# Patient Record
Sex: Female | Born: 1965 | Race: White | Hispanic: No | Smoking: Never smoker
Health system: Southern US, Community
[De-identification: ages and names within clinical notes are randomized; demographics above are authoritative.]

## PROBLEM LIST (undated history)

## (undated) DIAGNOSIS — Z9221 Personal history of antineoplastic chemotherapy: Secondary | ICD-10-CM

## (undated) DIAGNOSIS — F32A Depression, unspecified: Secondary | ICD-10-CM

## (undated) DIAGNOSIS — Z9289 Personal history of other medical treatment: Secondary | ICD-10-CM

## (undated) DIAGNOSIS — F329 Major depressive disorder, single episode, unspecified: Secondary | ICD-10-CM

## (undated) DIAGNOSIS — E669 Obesity, unspecified: Secondary | ICD-10-CM

## (undated) DIAGNOSIS — I8290 Acute embolism and thrombosis of unspecified vein: Secondary | ICD-10-CM

## (undated) DIAGNOSIS — F419 Anxiety disorder, unspecified: Secondary | ICD-10-CM

## (undated) DIAGNOSIS — D649 Anemia, unspecified: Secondary | ICD-10-CM

## (undated) DIAGNOSIS — K219 Gastro-esophageal reflux disease without esophagitis: Secondary | ICD-10-CM

## (undated) DIAGNOSIS — N200 Calculus of kidney: Secondary | ICD-10-CM

## (undated) DIAGNOSIS — E119 Type 2 diabetes mellitus without complications: Secondary | ICD-10-CM

## (undated) DIAGNOSIS — E039 Hypothyroidism, unspecified: Secondary | ICD-10-CM

## (undated) DIAGNOSIS — K56609 Unspecified intestinal obstruction, unspecified as to partial versus complete obstruction: Secondary | ICD-10-CM

## (undated) HISTORY — DX: Obesity, unspecified: E66.9

## (undated) HISTORY — DX: Hypothyroidism, unspecified: E03.9

## (undated) HISTORY — DX: Unspecified intestinal obstruction, unspecified as to partial versus complete obstruction: K56.609

## (undated) HISTORY — DX: Anemia, unspecified: D64.9

## (undated) HISTORY — DX: Anxiety disorder, unspecified: F41.9

## (undated) HISTORY — DX: Depression, unspecified: F32.A

## (undated) HISTORY — DX: Personal history of antineoplastic chemotherapy: Z92.21

## (undated) HISTORY — DX: Acute embolism and thrombosis of unspecified vein: I82.90

## (undated) MED FILL — Fluorouracil IV Soln 5 GM/100ML (50 MG/ML): INTRAVENOUS | Qty: 87 | Status: AC

## (undated) MED FILL — Bevacizumab-awwb IV Soln 400 MG/16ML (For Infusion): INTRAVENOUS | Qty: 14 | Status: AC

## (undated) MED FILL — Dexamethasone Sodium Phosphate Inj 100 MG/10ML: INTRAMUSCULAR | Qty: 1 | Status: AC

## (undated) MED FILL — Oxaliplatin IV Soln 100 MG/20ML: INTRAVENOUS | Qty: 30 | Status: AC

## (undated) MED FILL — Fluorouracil IV Soln 5 GM/100ML (50 MG/ML): INTRAVENOUS | Qty: 81 | Status: AC

## (undated) MED FILL — Fosaprepitant Dimeglumine For IV Infusion 150 MG (Base Eq): INTRAVENOUS | Qty: 5 | Status: AC

## (undated) MED FILL — Fluorouracil IV Soln 2.5 GM/50ML (50 MG/ML): INTRAVENOUS | Qty: 14 | Status: AC

## (undated) MED FILL — Fluorouracil IV Soln 2.5 GM/50ML (50 MG/ML): INTRAVENOUS | Qty: 17 | Status: AC

## (undated) MED FILL — Fluorouracil IV Soln 2.5 GM/50ML (50 MG/ML): INTRAVENOUS | Qty: 16 | Status: AC

## (undated) MED FILL — Irinotecan HCl Inj 100 MG/5ML (20 MG/ML): INTRAVENOUS | Qty: 14 | Status: AC

## (undated) MED FILL — Leucovorin Calcium For Inj 350 MG: INTRAMUSCULAR | Qty: 41.8 | Status: AC

## (undated) MED FILL — Oxaliplatin IV Soln 50 MG/10ML: INTRAVENOUS | Qty: 23 | Status: AC

## (undated) MED FILL — Oxaliplatin IV Soln 50 MG/10ML: INTRAVENOUS | Qty: 30 | Status: AC

---
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
## 2010-06-01 DIAGNOSIS — Z87442 Personal history of urinary calculi: Secondary | ICD-10-CM

## 2010-06-01 HISTORY — DX: Personal history of urinary calculi: Z87.442

## 2016-06-01 DIAGNOSIS — C189 Malignant neoplasm of colon, unspecified: Secondary | ICD-10-CM

## 2016-06-01 HISTORY — DX: Malignant neoplasm of colon, unspecified: C18.9

## 2017-03-01 HISTORY — PX: COLON SURGERY: SHX602

## 2017-03-02 DIAGNOSIS — E860 Dehydration: Secondary | ICD-10-CM | POA: Diagnosis not present

## 2017-03-02 DIAGNOSIS — A419 Sepsis, unspecified organism: Secondary | ICD-10-CM

## 2017-03-02 DIAGNOSIS — F329 Major depressive disorder, single episode, unspecified: Secondary | ICD-10-CM | POA: Diagnosis not present

## 2017-03-02 DIAGNOSIS — E119 Type 2 diabetes mellitus without complications: Secondary | ICD-10-CM | POA: Diagnosis not present

## 2017-03-02 DIAGNOSIS — K639 Disease of intestine, unspecified: Secondary | ICD-10-CM

## 2017-03-02 DIAGNOSIS — K56609 Unspecified intestinal obstruction, unspecified as to partial versus complete obstruction: Secondary | ICD-10-CM

## 2017-03-03 DIAGNOSIS — A419 Sepsis, unspecified organism: Secondary | ICD-10-CM | POA: Diagnosis not present

## 2017-03-03 DIAGNOSIS — E860 Dehydration: Secondary | ICD-10-CM | POA: Diagnosis not present

## 2017-03-03 DIAGNOSIS — K639 Disease of intestine, unspecified: Secondary | ICD-10-CM | POA: Diagnosis not present

## 2017-03-03 DIAGNOSIS — K56609 Unspecified intestinal obstruction, unspecified as to partial versus complete obstruction: Secondary | ICD-10-CM | POA: Diagnosis not present

## 2017-03-04 DIAGNOSIS — K56609 Unspecified intestinal obstruction, unspecified as to partial versus complete obstruction: Secondary | ICD-10-CM | POA: Diagnosis not present

## 2017-03-04 DIAGNOSIS — A419 Sepsis, unspecified organism: Secondary | ICD-10-CM | POA: Diagnosis not present

## 2017-03-04 DIAGNOSIS — E119 Type 2 diabetes mellitus without complications: Secondary | ICD-10-CM | POA: Diagnosis not present

## 2017-03-04 DIAGNOSIS — E860 Dehydration: Secondary | ICD-10-CM | POA: Diagnosis not present

## 2017-03-04 DIAGNOSIS — F329 Major depressive disorder, single episode, unspecified: Secondary | ICD-10-CM | POA: Diagnosis not present

## 2017-03-04 DIAGNOSIS — K639 Disease of intestine, unspecified: Secondary | ICD-10-CM | POA: Diagnosis not present

## 2017-03-05 DIAGNOSIS — A419 Sepsis, unspecified organism: Secondary | ICD-10-CM | POA: Diagnosis not present

## 2017-03-05 DIAGNOSIS — K56609 Unspecified intestinal obstruction, unspecified as to partial versus complete obstruction: Secondary | ICD-10-CM | POA: Diagnosis not present

## 2017-03-05 DIAGNOSIS — E860 Dehydration: Secondary | ICD-10-CM | POA: Diagnosis not present

## 2017-03-05 DIAGNOSIS — K639 Disease of intestine, unspecified: Secondary | ICD-10-CM | POA: Diagnosis not present

## 2017-03-06 DIAGNOSIS — K639 Disease of intestine, unspecified: Secondary | ICD-10-CM | POA: Diagnosis not present

## 2017-03-06 DIAGNOSIS — K56609 Unspecified intestinal obstruction, unspecified as to partial versus complete obstruction: Secondary | ICD-10-CM | POA: Diagnosis not present

## 2017-03-06 DIAGNOSIS — E860 Dehydration: Secondary | ICD-10-CM | POA: Diagnosis not present

## 2017-03-06 DIAGNOSIS — A419 Sepsis, unspecified organism: Secondary | ICD-10-CM | POA: Diagnosis not present

## 2017-03-07 DIAGNOSIS — E860 Dehydration: Secondary | ICD-10-CM | POA: Diagnosis not present

## 2017-03-07 DIAGNOSIS — K639 Disease of intestine, unspecified: Secondary | ICD-10-CM | POA: Diagnosis not present

## 2017-03-07 DIAGNOSIS — A419 Sepsis, unspecified organism: Secondary | ICD-10-CM | POA: Diagnosis not present

## 2017-03-07 DIAGNOSIS — K56609 Unspecified intestinal obstruction, unspecified as to partial versus complete obstruction: Secondary | ICD-10-CM | POA: Diagnosis not present

## 2017-03-08 DIAGNOSIS — K56609 Unspecified intestinal obstruction, unspecified as to partial versus complete obstruction: Secondary | ICD-10-CM | POA: Diagnosis not present

## 2017-03-08 DIAGNOSIS — A419 Sepsis, unspecified organism: Secondary | ICD-10-CM | POA: Diagnosis not present

## 2017-03-08 DIAGNOSIS — E860 Dehydration: Secondary | ICD-10-CM | POA: Diagnosis not present

## 2017-03-08 DIAGNOSIS — K639 Disease of intestine, unspecified: Secondary | ICD-10-CM | POA: Diagnosis not present

## 2017-03-09 DIAGNOSIS — A419 Sepsis, unspecified organism: Secondary | ICD-10-CM | POA: Diagnosis not present

## 2017-03-09 DIAGNOSIS — K639 Disease of intestine, unspecified: Secondary | ICD-10-CM | POA: Diagnosis not present

## 2017-03-09 DIAGNOSIS — K56609 Unspecified intestinal obstruction, unspecified as to partial versus complete obstruction: Secondary | ICD-10-CM | POA: Diagnosis not present

## 2017-03-09 DIAGNOSIS — E860 Dehydration: Secondary | ICD-10-CM | POA: Diagnosis not present

## 2017-03-17 DIAGNOSIS — C183 Malignant neoplasm of hepatic flexure: Secondary | ICD-10-CM | POA: Insufficient documentation

## 2017-03-19 DIAGNOSIS — C182 Malignant neoplasm of ascending colon: Secondary | ICD-10-CM | POA: Diagnosis not present

## 2017-04-26 DIAGNOSIS — I951 Orthostatic hypotension: Secondary | ICD-10-CM

## 2017-04-26 DIAGNOSIS — C182 Malignant neoplasm of ascending colon: Secondary | ICD-10-CM | POA: Diagnosis not present

## 2017-05-11 DIAGNOSIS — C182 Malignant neoplasm of ascending colon: Secondary | ICD-10-CM | POA: Diagnosis not present

## 2017-05-11 DIAGNOSIS — Z86718 Personal history of other venous thrombosis and embolism: Secondary | ICD-10-CM | POA: Diagnosis not present

## 2017-05-11 DIAGNOSIS — Z9049 Acquired absence of other specified parts of digestive tract: Secondary | ICD-10-CM | POA: Diagnosis not present

## 2017-05-24 DIAGNOSIS — I951 Orthostatic hypotension: Secondary | ICD-10-CM | POA: Diagnosis not present

## 2017-05-24 DIAGNOSIS — Z86718 Personal history of other venous thrombosis and embolism: Secondary | ICD-10-CM | POA: Diagnosis not present

## 2017-05-24 DIAGNOSIS — Z7901 Long term (current) use of anticoagulants: Secondary | ICD-10-CM | POA: Diagnosis not present

## 2017-05-24 DIAGNOSIS — Z9049 Acquired absence of other specified parts of digestive tract: Secondary | ICD-10-CM | POA: Diagnosis not present

## 2017-05-24 DIAGNOSIS — C182 Malignant neoplasm of ascending colon: Secondary | ICD-10-CM | POA: Diagnosis not present

## 2017-06-01 DIAGNOSIS — I82409 Acute embolism and thrombosis of unspecified deep veins of unspecified lower extremity: Secondary | ICD-10-CM

## 2017-06-01 HISTORY — PX: COLONOSCOPY: SHX174

## 2017-06-01 HISTORY — DX: Acute embolism and thrombosis of unspecified deep veins of unspecified lower extremity: I82.409

## 2017-06-08 DIAGNOSIS — D701 Agranulocytosis secondary to cancer chemotherapy: Secondary | ICD-10-CM | POA: Diagnosis not present

## 2017-06-08 DIAGNOSIS — D6959 Other secondary thrombocytopenia: Secondary | ICD-10-CM | POA: Diagnosis not present

## 2017-06-08 DIAGNOSIS — C182 Malignant neoplasm of ascending colon: Secondary | ICD-10-CM | POA: Diagnosis not present

## 2017-06-08 DIAGNOSIS — Z7901 Long term (current) use of anticoagulants: Secondary | ICD-10-CM | POA: Diagnosis not present

## 2017-06-22 DIAGNOSIS — Z7901 Long term (current) use of anticoagulants: Secondary | ICD-10-CM | POA: Diagnosis not present

## 2017-06-22 DIAGNOSIS — C182 Malignant neoplasm of ascending colon: Secondary | ICD-10-CM | POA: Diagnosis not present

## 2017-06-22 DIAGNOSIS — D701 Agranulocytosis secondary to cancer chemotherapy: Secondary | ICD-10-CM | POA: Diagnosis not present

## 2017-06-22 DIAGNOSIS — D6959 Other secondary thrombocytopenia: Secondary | ICD-10-CM | POA: Diagnosis not present

## 2017-07-05 DIAGNOSIS — C182 Malignant neoplasm of ascending colon: Secondary | ICD-10-CM | POA: Diagnosis not present

## 2017-07-05 DIAGNOSIS — D701 Agranulocytosis secondary to cancer chemotherapy: Secondary | ICD-10-CM | POA: Diagnosis not present

## 2017-07-05 DIAGNOSIS — D6959 Other secondary thrombocytopenia: Secondary | ICD-10-CM | POA: Diagnosis not present

## 2017-07-26 DIAGNOSIS — D6959 Other secondary thrombocytopenia: Secondary | ICD-10-CM | POA: Diagnosis not present

## 2017-07-26 DIAGNOSIS — C182 Malignant neoplasm of ascending colon: Secondary | ICD-10-CM | POA: Diagnosis not present

## 2017-07-26 DIAGNOSIS — R040 Epistaxis: Secondary | ICD-10-CM | POA: Diagnosis not present

## 2017-07-26 DIAGNOSIS — R197 Diarrhea, unspecified: Secondary | ICD-10-CM | POA: Diagnosis not present

## 2017-07-26 DIAGNOSIS — Z9049 Acquired absence of other specified parts of digestive tract: Secondary | ICD-10-CM | POA: Diagnosis not present

## 2017-08-10 DIAGNOSIS — C182 Malignant neoplasm of ascending colon: Secondary | ICD-10-CM | POA: Diagnosis not present

## 2017-08-10 DIAGNOSIS — Z9049 Acquired absence of other specified parts of digestive tract: Secondary | ICD-10-CM | POA: Diagnosis not present

## 2017-08-24 DIAGNOSIS — C182 Malignant neoplasm of ascending colon: Secondary | ICD-10-CM | POA: Diagnosis not present

## 2017-08-24 DIAGNOSIS — R42 Dizziness and giddiness: Secondary | ICD-10-CM | POA: Diagnosis not present

## 2017-08-24 DIAGNOSIS — D6959 Other secondary thrombocytopenia: Secondary | ICD-10-CM | POA: Diagnosis not present

## 2017-08-24 DIAGNOSIS — Z9049 Acquired absence of other specified parts of digestive tract: Secondary | ICD-10-CM | POA: Diagnosis not present

## 2017-10-07 DIAGNOSIS — G629 Polyneuropathy, unspecified: Secondary | ICD-10-CM | POA: Diagnosis not present

## 2017-10-07 DIAGNOSIS — Z85038 Personal history of other malignant neoplasm of large intestine: Secondary | ICD-10-CM | POA: Diagnosis not present

## 2017-10-07 DIAGNOSIS — Z9221 Personal history of antineoplastic chemotherapy: Secondary | ICD-10-CM | POA: Diagnosis not present

## 2017-10-07 DIAGNOSIS — Z9049 Acquired absence of other specified parts of digestive tract: Secondary | ICD-10-CM | POA: Diagnosis not present

## 2017-10-07 DIAGNOSIS — Z809 Family history of malignant neoplasm, unspecified: Secondary | ICD-10-CM | POA: Diagnosis not present

## 2018-02-07 DIAGNOSIS — Z9049 Acquired absence of other specified parts of digestive tract: Secondary | ICD-10-CM | POA: Diagnosis not present

## 2018-02-07 DIAGNOSIS — Z9221 Personal history of antineoplastic chemotherapy: Secondary | ICD-10-CM

## 2018-02-07 DIAGNOSIS — Z85038 Personal history of other malignant neoplasm of large intestine: Secondary | ICD-10-CM | POA: Diagnosis not present

## 2018-03-30 DIAGNOSIS — F429 Obsessive-compulsive disorder, unspecified: Secondary | ICD-10-CM | POA: Insufficient documentation

## 2018-04-14 ENCOUNTER — Encounter: Payer: Self-pay | Admitting: Psychiatry

## 2018-04-14 ENCOUNTER — Ambulatory Visit (INDEPENDENT_AMBULATORY_CARE_PROVIDER_SITE_OTHER): Payer: PRIVATE HEALTH INSURANCE | Admitting: Psychiatry

## 2018-04-14 DIAGNOSIS — F333 Major depressive disorder, recurrent, severe with psychotic symptoms: Secondary | ICD-10-CM | POA: Diagnosis not present

## 2018-04-14 DIAGNOSIS — F422 Mixed obsessional thoughts and acts: Secondary | ICD-10-CM | POA: Diagnosis not present

## 2018-04-14 DIAGNOSIS — F331 Major depressive disorder, recurrent, moderate: Secondary | ICD-10-CM | POA: Diagnosis not present

## 2018-04-14 MED ORDER — LAMOTRIGINE 150 MG PO TABS: 150.0000 mg | ORAL_TABLET | Freq: Every day | ORAL | 5 refills | Status: DC

## 2018-04-14 MED ORDER — SEROQUEL 400 MG PO TABS: 800.0000 mg | ORAL_TABLET | Freq: Every day | ORAL | 11 refills | Status: DC

## 2018-04-14 MED ORDER — FLUVOXAMINE MALEATE 100 MG PO TABS: 300.0000 mg | ORAL_TABLET | Freq: Every day | ORAL | 5 refills | Status: DC

## 2018-04-14 NOTE — Progress Notes (Signed)
Shannon [name] [id number] [birth year] 52 y.o.  Subjective:   Patient ID:  Shannon [name] is a 52 y.o. (DOB 17-Nov-1965) female.  Chief Complaint:  Chief Complaint  Patient presents with  . Follow-up    depression and anxiety    HPI Shannon [name] presents to the office today for follow-up of major depression with psychotic features and OCD.  Feel good.  Thinking is good.  Better at work.  Better productivity and quality DT feeling better mentally and physically.  Friendlier. Patient reports stable mood and denies depressed or irritable moods.  Patient denies any recent difficulty with anxiety.  Patient denies difficulty with sleep initiation or maintenance. Denies appetite disturbance.  Patient reports that energy and motivation have been good.  Patient denies any difficulty with concentration.  Patient denies any suicidal ideation. Anxious, obsessive thoughts minimal.  Review of Systems:  Review of Systems  Constitutional: Positive for unexpected weight change.  Neurological: Negative for tremors and weakness.  Psychiatric/Behavioral: Negative for agitation, behavioral problems, confusion, decreased concentration, dysphoric mood, hallucinations, self-injury, sleep disturbance and suicidal ideas. The patient is not nervous/anxious and is not hyperactive.     Medications: I have reviewed the patient's current medications.  Current Outpatient Medications  Medication Sig Dispense Refill  . atorvastatin (LIPITOR) 10 MG tablet Take 10 mg by mouth daily.  0  . Dulaglutide (TRULICITY) 1.5 WU/9.8JX SOPN INJECT 1.5MG  ONCE WEEKLY AS DIRECTED    . fluvoxaMINE (LUVOX) 100 MG tablet Take 3 tablets (300 mg total) by mouth at bedtime. 90 tablet 5  . insulin aspart (NOVOLOG FLEXPEN) 100 UNIT/ML FlexPen USE AS DIRECTED PER SLIDING SCALE    . lamoTRIgine (LAMICTAL) 150 MG tablet Take 1 tablet (150 mg total) by mouth daily. 30 tablet 5  . levothyroxine (SYNTHROID,  LEVOTHROID) 50 MCG tablet Take 50 mcg by mouth daily.  2  . metFORMIN (GLUCOPHAGE) 500 MG tablet Take by mouth.    . SEROQUEL 400 MG tablet Take 2 tablets (800 mg total) by mouth at bedtime. Brand medically necessary 60 tablet 11   No current facility-administered medications for this visit.     Medication Side Effects: None  Allergies:  Allergies  Allergen Reactions  . Clindamycin/Lincomycin Rash  . Penicillins Rash    History reviewed. No pertinent past medical history.  History reviewed. No pertinent family history.  Social History   Socioeconomic History  . Marital status: Widowed    Spouse name: Not on file  . Number of children: Not on file  . Years of education: Not on file  . Highest education level: Not on file  Occupational History  . Not on file  Social Needs  . Financial resource strain: Not on file  . Food insecurity:    Worry: Not on file    Inability: Not on file  . Transportation needs:    Medical: Not on file    Non-medical: Not on file  Tobacco Use  . Smoking status: Not on file  Substance and Sexual Activity  . Alcohol use: Not on file  . Drug use: Not on file  . Sexual activity: Not on file  Lifestyle  . Physical activity:    Days per week: Not on file    Minutes per session: Not on file  . Stress: Not on file  Relationships  . Social connections:    Talks on phone: Not on file    Gets together: Not on file    Attends religious service:  Not on file    Active member of club or organization: Not on file    Attends meetings of clubs or organizations: Not on file    Relationship status: Not on file  . Intimate partner violence:    Fear of current or ex partner: Not on file    Emotionally abused: Not on file    Physically abused: Not on file    Forced sexual activity: Not on file  Other Topics Concern  . Not on file  Social History Narrative  . Not on file    Past Medical History, Surgical history, Social history, and Family history  were reviewed and updated as appropriate.   Please see review of systems for further details on the patient's review from today.   Objective:   Physical Exam:  There were no vitals taken for this visit.  Physical Exam  Constitutional: She is oriented to person, place, and time. She appears well-developed. No distress.  Musculoskeletal: She exhibits no deformity.  Neurological: She is alert and oriented to person, place, and time. She displays no tremor. Coordination and gait normal.  Psychiatric: She has a normal mood and affect. Her speech is normal and behavior is normal. Judgment and thought content normal. Her mood appears not anxious. Her affect is not angry, not blunt, not labile and not inappropriate. Cognition and memory are normal. She does not exhibit a depressed mood. She expresses no homicidal and no suicidal ideation. She expresses no suicidal plans and no homicidal plans.  Insight intact. No auditory or visual hallucinations. No delusions.     Lab Review:  No results found for: NA, K, CL, CO2, GLUCOSE, BUN, CREATININE, CALCIUM, PROT, ALBUMIN, AST, ALT, ALKPHOS, BILITOT, GFRNONAA, GFRAA  No results found for: WBC, RBC, HGB, HCT, PLT, MCV, MCH, MCHC, RDW, LYMPHSABS, MONOABS, EOSABS, BASOSABS  No results found for: POCLITH, LITHIUM   No results found for: PHENYTOIN, PHENOBARB, VALPROATE, CBMZ   .res Assessment: Plan:    Severe recurrent major depression with psychotic features ([place]) - Plan: SEROQUEL 400 MG tablet  Mixed obsessional thoughts and acts  Major depressive disorder, recurrent episode, moderate ([place])   Discussed potential metabolic side effects associated with atypical antipsychotics, as well as potential risk for movement side effects. Advised pt to contact office if movement side effects occur.   Counseled patient regarding potential benefits, risks, and side effects of Lamictal to include potential risk of Stevens-Johnson syndrome. Advised patient to stop  taking Lamictal and contact office immediately if rash develops and to seek urgent medical attention if rash is severe and/or spreading quickly.  She's benefitting from meds and is satisfied.  Continue current meds.  Encourage diet and wt loss.  This appt was 30 mins.  FU a few months.  Lynder Parents, MD, DFAPA   Please see After Visit Summary for patient specific instructions.  No future appointments.  No orders of the defined types were placed in this encounter.     -------------------------------

## 2018-06-01 HISTORY — PX: BREAST BIOPSY: SHX20

## 2018-06-15 DIAGNOSIS — Z85038 Personal history of other malignant neoplasm of large intestine: Secondary | ICD-10-CM | POA: Diagnosis not present

## 2018-06-15 DIAGNOSIS — Z9221 Personal history of antineoplastic chemotherapy: Secondary | ICD-10-CM | POA: Diagnosis not present

## 2018-06-15 DIAGNOSIS — R1013 Epigastric pain: Secondary | ICD-10-CM

## 2018-06-15 DIAGNOSIS — Z9049 Acquired absence of other specified parts of digestive tract: Secondary | ICD-10-CM | POA: Diagnosis not present

## 2018-06-22 DIAGNOSIS — Z85038 Personal history of other malignant neoplasm of large intestine: Secondary | ICD-10-CM | POA: Diagnosis not present

## 2018-06-22 DIAGNOSIS — K769 Liver disease, unspecified: Secondary | ICD-10-CM

## 2018-10-11 ENCOUNTER — Other Ambulatory Visit: Payer: Self-pay | Admitting: Psychiatry

## 2018-10-11 DIAGNOSIS — F333 Major depressive disorder, recurrent, severe with psychotic symptoms: Secondary | ICD-10-CM

## 2018-10-12 ENCOUNTER — Ambulatory Visit (INDEPENDENT_AMBULATORY_CARE_PROVIDER_SITE_OTHER): Payer: PRIVATE HEALTH INSURANCE | Admitting: Psychiatry

## 2018-10-12 ENCOUNTER — Encounter: Payer: Self-pay | Admitting: Psychiatry

## 2018-10-12 DIAGNOSIS — G4721 Circadian rhythm sleep disorder, delayed sleep phase type: Secondary | ICD-10-CM

## 2018-10-12 DIAGNOSIS — F422 Mixed obsessional thoughts and acts: Secondary | ICD-10-CM | POA: Diagnosis not present

## 2018-10-12 DIAGNOSIS — F333 Major depressive disorder, recurrent, severe with psychotic symptoms: Secondary | ICD-10-CM

## 2018-10-12 MED ORDER — SEROQUEL 400 MG PO TABS: 800.0000 mg | ORAL_TABLET | Freq: Every day | ORAL | 3 refills | Status: DC

## 2018-10-12 MED ORDER — FLUVOXAMINE MALEATE 100 MG PO TABS: 300.0000 mg | ORAL_TABLET | Freq: Every day | ORAL | 1 refills | Status: DC

## 2018-10-12 MED ORDER — LAMOTRIGINE 150 MG PO TABS: 150.0000 mg | ORAL_TABLET | Freq: Every day | ORAL | 1 refills | Status: DC

## 2018-10-12 NOTE — Progress Notes (Signed)
Shannon [name] [id number] 1966/01/25 53 y.o.   Virtual Visit via Telephone Note  I connected with pt by telephone and verified that I am speaking with the correct person using two identifiers.   I discussed the limitations, risks, security and privacy concerns of performing an evaluation and management service by telephone and the availability of in person appointments. I also discussed with the patient that there may be a patient responsible charge related to this service. The patient expressed understanding and agreed to proceed.  I discussed the assessment and treatment plan with the patient. The patient was provided an opportunity to ask questions and all were answered. The patient agreed with the plan and demonstrated an understanding of the instructions.   The patient was advised to call back or seek an in-person evaluation if the symptoms worsen or if the condition fails to improve as anticipated.  I provided 15 minutes of non-face-to-face time during this encounter. The call started at 455 and ended at 5:10. The patient was located at home and the provider was located office.   Subjective:   Patient ID:  Shannon [name] is a 53 y.o. (DOB 04/08/1966) female.  Chief Complaint:  Chief Complaint  Patient presents with  . Follow-up    med mangement    HPI Shannon [name] presents  today for follow-up of major depression with psychotic features and OCD.  Last seen in November.  No meds were changed at that time.  Covid affected routine and her sleep.  Ativan has not helped sleep. Rarely takes.  No caffeine usually after noon.  Some fears of Covid and dying of it.  Stopped watching the news over it.  Feel good.  Thinking is good.  Better at work.  Better productivity and quality DT feeling better mentally and physically.  Friendlier. Only situational mood effects. Patient reports stable mood and denies depressed or irritable moods. Some anxiety over it.   Patient denies difficulty with sleep maintenance.  Being some trouble falling asleep but is got off of her usual sleep-wake cycle due to the Covid virus.  Denies appetite disturbance.  Patient reports that energy and motivation have been good.  Patient denies any difficulty with concentration.  Patient denies any suicidal ideation. Anxious, obsessive thoughts minimal.  Past Psychiatric Medication Trials: HX RELAPSE PSYCHOSIS WITH GENERIC QUETIAPINE,  Citalopram, fluoxetine 80, sertraline to 50, in Vega 3, Abilify 15, lamotrigine 200, Seroquel 800, Risperdal 2 Has been under the care of the psychiatrist since March 2000  Review of Systems:  Review of Systems  Constitutional: Positive for unexpected weight change.  Neurological: Negative for tremors and weakness.  Psychiatric/Behavioral: Negative for agitation, behavioral problems, confusion, decreased concentration, dysphoric mood, hallucinations, self-injury, sleep disturbance and suicidal ideas. The patient is not nervous/anxious and is not hyperactive.     Medications: I have reviewed the patient's current medications.  Current Outpatient Medications  Medication Sig Dispense Refill  . atorvastatin (LIPITOR) 10 MG tablet Take 10 mg by mouth daily.  0  . Dulaglutide (TRULICITY) 1.5 YD/7.4JO SOPN INJECT 1.5MG  ONCE WEEKLY AS DIRECTED    . fluvoxaMINE (LUVOX) 100 MG tablet Take 3 tablets (300 mg total) by mouth at bedtime. 270 tablet 1  . insulin aspart (NOVOLOG FLEXPEN) 100 UNIT/ML FlexPen USE AS DIRECTED PER SLIDING SCALE    . lamoTRIgine (LAMICTAL) 150 MG tablet Take 1 tablet (150 mg total) by mouth daily. 90 tablet 1  . levothyroxine (SYNTHROID, LEVOTHROID) 50 MCG tablet Take 50  mcg by mouth daily.  2  . metFORMIN (GLUCOPHAGE) 500 MG tablet Take by mouth.    . SEROQUEL 400 MG tablet Take 2 tablets (800 mg total) by mouth at bedtime. Brand medically necessary 180 tablet 3   No current facility-administered medications for this visit.      Medication Side Effects: None  Allergies:  Allergies  Allergen Reactions  . Clindamycin/Lincomycin Rash  . Penicillins Rash    History reviewed. No pertinent past medical history.  History reviewed. No pertinent family history.  Social History   Socioeconomic History  . Marital status: Widowed    Spouse name: Not on file  . Number of children: Not on file  . Years of education: Not on file  . Highest education level: Not on file  Occupational History  . Not on file  Social Needs  . Financial resource strain: Not on file  . Food insecurity:    Worry: Not on file    Inability: Not on file  . Transportation needs:    Medical: Not on file    Non-medical: Not on file  Tobacco Use  . Smoking status: Never Smoker  . Smokeless tobacco: Never Used  Substance and Sexual Activity  . Alcohol use: Not on file  . Drug use: Not on file  . Sexual activity: Not on file  Lifestyle  . Physical activity:    Days per week: Not on file    Minutes per session: Not on file  . Stress: Not on file  Relationships  . Social connections:    Talks on phone: Not on file    Gets together: Not on file    Attends religious service: Not on file    Active member of club or organization: Not on file    Attends meetings of clubs or organizations: Not on file    Relationship status: Not on file  . Intimate partner violence:    Fear of current or ex partner: Not on file    Emotionally abused: Not on file    Physically abused: Not on file    Forced sexual activity: Not on file  Other Topics Concern  . Not on file  Social History Narrative  . Not on file    Past Medical History, Surgical history, Social history, and Family history were reviewed and updated as appropriate.   Please see review of systems for further details on the patient's review from today.   Objective:   Physical Exam:  There were no vitals taken for this visit.  Physical Exam Constitutional:      General: She  is not in acute distress.    Appearance: She is well-developed.  Musculoskeletal:        General: No deformity.  Neurological:     Mental Status: She is alert and oriented to person, place, and time.     Motor: No tremor.     Coordination: Coordination normal.     Gait: Gait normal.  Psychiatric:        Mood and Affect: Mood is not anxious or depressed. Affect is not labile, blunt, angry or inappropriate.        Speech: Speech normal.        Behavior: Behavior normal.        Thought Content: Thought content normal. Thought content does not include homicidal or suicidal ideation. Thought content does not include homicidal or suicidal plan.        Judgment: Judgment normal.  Comments: Insight intact. No auditory or visual hallucinations. No delusions.      Lab Review:  No results found for: NA, K, CL, CO2, GLUCOSE, BUN, CREATININE, CALCIUM, PROT, ALBUMIN, AST, ALT, ALKPHOS, BILITOT, GFRNONAA, GFRAA  No results found for: WBC, RBC, HGB, HCT, PLT, MCV, MCH, MCHC, RDW, LYMPHSABS, MONOABS, EOSABS, BASOSABS  No results found for: POCLITH, LITHIUM   No results found for: PHENYTOIN, PHENOBARB, VALPROATE, CBMZ   .res Assessment: Plan:    Severe recurrent major depression with psychotic features ([place]) - Plan: SEROQUEL 400 MG tablet  Mixed obsessional thoughts and acts  Delayed sleep phase syndrome   Discussed potential metabolic side effects associated with atypical antipsychotics, as well as potential risk for movement side effects. Advised pt to contact office if movement side effects occur.   Counseled patient regarding potential benefits, risks, and side effects of Lamictal to include potential risk of Stevens-Johnson syndrome. Advised patient to stop taking Lamictal and contact office immediately if rash develops and to seek urgent medical attention if rash is severe and/or spreading quickly.  She's benefitting from meds and is satisfied.  Continue current meds. HX RELAPSE  PSYCHOSIS WITH GENERIC QUETIAPINE,  Encourage diet and wt loss.  Sleep hygiene.  No caffeine after 3 PM at the latest.  Option of melatonin  FU a few months.  Lynder Parents, MD, DFAPA   Please see After Visit Summary for patient specific instructions.  No future appointments.  No orders of the defined types were placed in this encounter.     -------------------------------

## 2018-10-21 DIAGNOSIS — Z85038 Personal history of other malignant neoplasm of large intestine: Secondary | ICD-10-CM | POA: Diagnosis not present

## 2019-02-06 ENCOUNTER — Other Ambulatory Visit: Payer: Self-pay | Admitting: Psychiatry

## 2019-02-06 DIAGNOSIS — F333 Major depressive disorder, recurrent, severe with psychotic symptoms: Secondary | ICD-10-CM

## 2019-02-24 DIAGNOSIS — Z803 Family history of malignant neoplasm of breast: Secondary | ICD-10-CM | POA: Diagnosis not present

## 2019-02-24 DIAGNOSIS — C182 Malignant neoplasm of ascending colon: Secondary | ICD-10-CM

## 2019-02-25 ENCOUNTER — Other Ambulatory Visit: Payer: Self-pay | Admitting: Psychiatry

## 2019-02-27 ENCOUNTER — Other Ambulatory Visit: Payer: Self-pay | Admitting: Oncology

## 2019-02-27 DIAGNOSIS — R9389 Abnormal findings on diagnostic imaging of other specified body structures: Secondary | ICD-10-CM

## 2019-03-09 ENCOUNTER — Other Ambulatory Visit: Payer: Self-pay

## 2019-03-09 ENCOUNTER — Ambulatory Visit
Admission: RE | Admit: 2019-03-09 | Discharge: 2019-03-09 | Disposition: A | Discharge status: Home / Self Care | Payer: PRIVATE HEALTH INSURANCE | Source: Ambulatory Visit | Attending: *Deleted | Admitting: *Deleted

## 2019-03-09 ENCOUNTER — Other Ambulatory Visit (HOSPITAL_COMMUNITY): Payer: Self-pay | Admitting: Diagnostic Radiology

## 2019-03-09 DIAGNOSIS — R9389 Abnormal findings on diagnostic imaging of other specified body structures: Secondary | ICD-10-CM

## 2019-03-09 MED ORDER — GADOBUTROL 1 MMOL/ML IV SOLN
9.0000 mL | Freq: Once | INTRAVENOUS | Status: AC | PRN
  Administered 2019-03-09: 9 mL via INTRAVENOUS

## 2019-04-17 ENCOUNTER — Ambulatory Visit (INDEPENDENT_AMBULATORY_CARE_PROVIDER_SITE_OTHER): Payer: PRIVATE HEALTH INSURANCE | Admitting: Psychiatry

## 2019-04-17 ENCOUNTER — Other Ambulatory Visit: Payer: Self-pay

## 2019-04-17 ENCOUNTER — Encounter: Payer: Self-pay | Admitting: Psychiatry

## 2019-04-17 DIAGNOSIS — F333 Major depressive disorder, recurrent, severe with psychotic symptoms: Secondary | ICD-10-CM

## 2019-04-17 DIAGNOSIS — F422 Mixed obsessional thoughts and acts: Secondary | ICD-10-CM | POA: Diagnosis not present

## 2019-04-17 NOTE — Progress Notes (Signed)
Shannon [name] [id number] [birth year] 53 y.o.    Subjective:   Patient ID:  Shannon [name] is a 53 y.o. (DOB November 27, 1965) female.  Chief Complaint:  Chief Complaint  Patient presents with  . Follow-up    Medication Management  . Depression    Medication Management    HPI Shannon [name] presents  today for follow-up of major depression with psychotic features and OCD.  Last seen in May 2020 .  No meds were changed at that time.  Good overall.  Struggling with Covid restrictions.  No mood swings.  Probably sleep too much to avoid.  Occ stress from work interferes with sleep.  Not as productive from home.  Hopes to get back to the office.  Can usually nap if desired for years and not seasonal.  Only anxiety is Covid related. More poor self care.   Hysterectomy December.  Feel good.  Thinking is good.  Better at work.  Better productivity and quality DT feeling better mentally and physically.  Friendlier. Only situational mood effects. Patient reports stable mood and denies depressed or irritable moods. Some anxiety over it.  Patient denies difficulty with sleep maintenance.  Being some trouble falling asleep but is got off of her usual sleep-wake cycle due to the Covid virus.  Denies appetite disturbance.  Patient reports that energy and motivation have been good.  Patient denies any difficulty with concentration.  Patient denies any suicidal ideation. Anxious, obsessive thoughts minimal.  Past Psychiatric Medication Trials: HX RELAPSE PSYCHOSIS WITH GENERIC QUETIAPINE,  Citalopram, fluoxetine 80, sertraline to 50, in [place] 3, Abilify 15, lamotrigine 200, Seroquel 800, Risperdal 2 Has been under the care of the psychiatrist since March 2000  Review of Systems:  Review of Systems  Constitutional: Positive for unexpected weight change.  Neurological: Negative for tremors and weakness.  Psychiatric/Behavioral: Negative for agitation, behavioral problems,  confusion, decreased concentration, dysphoric mood, hallucinations, self-injury, sleep disturbance and suicidal ideas. The patient is not nervous/anxious and is not hyperactive.     Medications: I have reviewed the patient's current medications.  Current Outpatient Medications  Medication Sig Dispense Refill  . atorvastatin (LIPITOR) 10 MG tablet Take 10 mg by mouth daily.  0  . Dulaglutide (TRULICITY) 1.5 0000000 SOPN INJECT 1.5MG  ONCE WEEKLY AS DIRECTED    . fluvoxaMINE (LUVOX) 100 MG tablet TAKE 3 TABLETS (300 MG TOTAL) BY MOUTH AT BEDTIME. 270 tablet 2  . insulin aspart (NOVOLOG FLEXPEN) 100 UNIT/ML FlexPen USE AS DIRECTED PER SLIDING SCALE    . lamoTRIgine (LAMICTAL) 150 MG tablet TAKE 1 TABLET BY MOUTH EVERY DAY 30 tablet 5  . LANTUS SOLOSTAR 100 UNIT/ML Solostar Pen SMARTSIG:65 Unit(s) SUB-Q Every Night    . levothyroxine (SYNTHROID) 75 MCG tablet Take 75 mcg by mouth daily.    . metFORMIN (GLUCOPHAGE) 500 MG tablet Take by mouth.    . SEROQUEL 400 MG tablet TAKE 2 TABLETS (800 MG TOTAL) BY MOUTH AT BEDTIME. BRAND MEDICALLY NECESSARY 180 tablet 4   No current facility-administered medications for this visit.     Medication Side Effects: None  Allergies:  Allergies  Allergen Reactions  . Clindamycin/Lincomycin Rash  . Penicillins Rash    History reviewed. No pertinent past medical history.  History reviewed. No pertinent family history.  Social History   Socioeconomic History  . Marital status: Widowed    Spouse name: Not on file  . Number of children: Not on file  . Years of education: Not on  file  . Highest education level: Not on file  Occupational History  . Not on file  Social Needs  . Financial resource strain: Not on file  . Food insecurity    Worry: Not on file    Inability: Not on file  . Transportation needs    Medical: Not on file    Non-medical: Not on file  Tobacco Use  . Smoking status: Never Smoker  . Smokeless tobacco: Never Used  Substance  and Sexual Activity  . Alcohol use: Not on file  . Drug use: Not on file  . Sexual activity: Not on file  Lifestyle  . Physical activity    Days per week: Not on file    Minutes per session: Not on file  . Stress: Not on file  Relationships  . Social Herbalist on phone: Not on file    Gets together: Not on file    Attends religious service: Not on file    Active member of club or organization: Not on file    Attends meetings of clubs or organizations: Not on file    Relationship status: Not on file  . Intimate partner violence    Fear of current or ex partner: Not on file    Emotionally abused: Not on file    Physically abused: Not on file    Forced sexual activity: Not on file  Other Topics Concern  . Not on file  Social History Narrative  . Not on file    Past Medical History, Surgical history, Social history, and Family history were reviewed and updated as appropriate.   Please see review of systems for further details on the patient's review from today.   Objective:   Physical Exam:  There were no vitals taken for this visit.  Physical Exam Constitutional:      General: She is not in acute distress.    Appearance: She is well-developed.  Musculoskeletal:        General: No deformity.  Neurological:     Mental Status: She is alert and oriented to person, place, and time.     Motor: No tremor.     Coordination: Coordination normal.     Gait: Gait normal.  Psychiatric:        Attention and Perception: She is attentive.        Mood and Affect: Mood is anxious. Mood is not depressed. Affect is not labile, blunt, angry or inappropriate.        Speech: Speech normal.        Behavior: Behavior normal.        Thought Content: Thought content normal. Thought content does not include homicidal or suicidal ideation. Thought content does not include homicidal or suicidal plan.        Cognition and Memory: Cognition normal.        Judgment: Judgment normal.      Comments: Insight intact. No auditory or visual hallucinations. No delusions.  Mild anxiety over the virus     Lab Review:  No results found for: NA, K, CL, CO2, GLUCOSE, BUN, CREATININE, CALCIUM, PROT, ALBUMIN, AST, ALT, ALKPHOS, BILITOT, GFRNONAA, GFRAA  No results found for: WBC, RBC, HGB, HCT, PLT, MCV, MCH, MCHC, RDW, LYMPHSABS, MONOABS, EOSABS, BASOSABS  No results found for: POCLITH, LITHIUM   No results found for: PHENYTOIN, PHENOBARB, VALPROATE, CBMZ   .res Assessment: Plan:    Severe recurrent major depression with psychotic features ([place])  Mixed obsessional thoughts  and acts   Overall mood and anxiety are managed.  Only thing causing any problems is Covid.    Discussed potential metabolic side effects associated with atypical antipsychotics, as well as potential risk for movement side effects. Advised pt to contact office if movement side effects occur.   Counseled patient regarding potential benefits, risks, and side effects of Lamictal to include potential risk of Stevens-Johnson syndrome. Advised patient to stop taking Lamictal and contact office immediately if rash develops and to seek urgent medical attention if rash is severe and/or spreading quickly.  She's benefitting from meds and is satisfied.  Continue current meds. HX RELAPSE PSYCHOSIS WITH GENERIC QUETIAPINE,  Encourage diet and wt loss.  Sleep hygiene.  No caffeine after 3 PM at the latest.  Option of melatonin  FU 6 mos  Shannon Parents, MD, DFAPA   Please see After Visit Summary for patient specific instructions.  No future appointments.  No orders of the defined types were placed in this encounter.     -------------------------------

## 2019-05-02 HISTORY — PX: APPENDECTOMY: SHX54

## 2019-05-02 HISTORY — PX: ABDOMINAL HYSTERECTOMY: SHX81

## 2019-08-15 ENCOUNTER — Encounter: Payer: Self-pay | Admitting: Gastroenterology

## 2019-08-24 DIAGNOSIS — C182 Malignant neoplasm of ascending colon: Secondary | ICD-10-CM | POA: Diagnosis not present

## 2019-08-30 ENCOUNTER — Other Ambulatory Visit: Payer: Self-pay | Admitting: Psychiatry

## 2019-09-15 ENCOUNTER — Encounter: Payer: Self-pay | Admitting: Gastroenterology

## 2019-09-15 ENCOUNTER — Ambulatory Visit (INDEPENDENT_AMBULATORY_CARE_PROVIDER_SITE_OTHER): Payer: PRIVATE HEALTH INSURANCE | Admitting: Gastroenterology

## 2019-09-15 VITALS — BP 130/80 | HR 108 | Temp 98.1°F | Ht 66.0 in | Wt 209.0 lb

## 2019-09-15 DIAGNOSIS — Z85038 Personal history of other malignant neoplasm of large intestine: Secondary | ICD-10-CM | POA: Diagnosis not present

## 2019-09-15 DIAGNOSIS — R142 Eructation: Secondary | ICD-10-CM

## 2019-09-15 DIAGNOSIS — R12 Heartburn: Secondary | ICD-10-CM

## 2019-09-15 DIAGNOSIS — R112 Nausea with vomiting, unspecified: Secondary | ICD-10-CM | POA: Diagnosis not present

## 2019-09-15 DIAGNOSIS — K529 Noninfective gastroenteritis and colitis, unspecified: Secondary | ICD-10-CM

## 2019-09-15 MED ORDER — ONDANSETRON HCL 4 MG PO TABS: 4.0000 mg | ORAL_TABLET | Freq: Three times a day (TID) | ORAL | 1 refills | Status: DC | PRN

## 2019-09-15 NOTE — Progress Notes (Addendum)
[facility] Consult Note:  History: Shannon [name] 09/15/2019  Referring provider: Greig Right, MD  Reason for consult/chief complaint: personal history of colon cancer (colon cancer 2018)   Subjective  HPI:  This is a pleasant 54 year old woman self-referred for multiple chronic digestive symptoms.  She recalls that about 2012 she began having chronic abdominal pain with alternating constipation and diarrhea.  Her PCP diagnosed with IBS and prescribed Linzess, but that was no help.  She would have intermittent belching that was a nuisance.  She was diagnosed with diabetes in 2015 when she reportedly presented with DKA and hemoglobin A1c of over 10 (this is from her history, as there are no outpatient records yet available for review). She had worsening constipation and abdominal pain in 2017, and then in October 2018 had emergency right hemicolectomy when she presented with a perforated obstructing colon cancer.  Her surgeon did a follow-up colonoscopy in May 2019, which she recalls was normal.  She lost about 40 pounds in 2015, and another 40 pounds in 2019.  Her weight has been stable for at least the last 6 months. Shannon [name] is bothered by frequent belching that occurs multiple times per day, it is foul-smelling and has associated nausea with vomiting.  The symptoms seem to be worsening over the last year.  She might feel particularly nauseated and then vomit in the morning which causes some relief.  She has generalized abdominal pain and bloating as well as chronic diarrhea.  Sometimes she will have "constipation" for half a day to a day, by which she means feeling of need for BM but nothing will occur or perhaps just a very small amount of stool.  Her most recent hemoglobin A1c is 9.6 as best she recalls. Shannon [name] is also bothered by frequent heartburn for which she takes Tums and has not previously taken any more potent acid suppression.  She denies dysphagia or  odynophagia.   ROS:  Review of Systems  Constitutional: Negative for appetite change and unexpected weight change.  HENT: Negative for mouth sores and voice change.   Eyes: Negative for pain and redness.  Respiratory: Negative for cough and shortness of breath.   Cardiovascular: Negative for chest pain and palpitations.  Genitourinary: Negative for dysuria and hematuria.  Musculoskeletal: Negative for arthralgias and myalgias.  Skin: Negative for pallor and rash.  Neurological: Negative for weakness and headaches.  Hematological: Negative for adenopathy.  Psychiatric/Behavioral:       Depression and anxiety, currently stable on her 3 medicine regimen     Past Medical History: Past Medical History:  Diagnosis Date  . Anemia   . Anxiety   . Blood clot in vein   . Bowel obstruction ([place])   . Colon cancer [facility]) 2018   Dr Corena Pilgrim in [place]   . Depression   . History of chemotherapy   . Hypothyroidism   . Kidney stones   . Obesity      Past Surgical History: Past Surgical History:  Procedure Laterality Date  . ABDOMINAL HYSTERECTOMY    . APPENDECTOMY    . COLON SURGERY  03/2017     Family History: Family History  Problem Relation Age of Onset  . Irritable bowel syndrome Mother   . Colon polyps Father   . Breast cancer Maternal Grandmother   . Diabetes Maternal Grandmother   . Diabetes Maternal Grandfather   . Breast cancer Paternal Grandmother   . Colon polyps Maternal Uncle   . Stomach cancer  Neg Hx   . Pancreatic cancer Neg Hx   . Esophageal cancer Neg Hx   . Colon cancer Neg Hx     Social History: Social History   Socioeconomic History  . Marital status: Widowed    Spouse name: Not on file  . Number of children: Not on file  . Years of education: Not on file  . Highest education level: Not on file  Occupational History  . Occupation: care coordinator   Tobacco Use  . Smoking status: Never Smoker  . Smokeless tobacco: Never Used  Substance  and Sexual Activity  . Alcohol use: Yes    Comment: rare  . Drug use: Never  . Sexual activity: Not Currently  Other Topics Concern  . Not on file  Social History Narrative  . Not on file   Social Determinants of Health   Financial Resource Strain:   . Difficulty of Paying Living Expenses:   Food Insecurity:   . Worried About Charity fundraiser in the Last Year:   . Arboriculturist in the Last Year:   Transportation Needs:   . Film/video editor (Medical):   Marland Kitchen Lack of Transportation (Non-Medical):   Physical Activity:   . Days of Exercise per Week:   . Minutes of Exercise per Session:   Stress:   . Feeling of Stress :   Social Connections:   . Frequency of Communication with Friends and Family:   . Frequency of Social Gatherings with Friends and Family:   . Attends Religious Services:   . Active Member of Clubs or Organizations:   . Attends Archivist Meetings:   Marland Kitchen Marital Status:     Allergies: Allergies  Allergen Reactions  . Clindamycin/Lincomycin Rash  . Penicillins Rash    Outpatient Meds: Current Outpatient Medications  Medication Sig Dispense Refill  . atorvastatin (LIPITOR) 10 MG tablet Take 10 mg by mouth daily.  0  . Dulaglutide (TRULICITY) 1.5 JJ/9.4RD SOPN INJECT 1.5MG ONCE WEEKLY AS DIRECTED    . fluvoxaMINE (LUVOX) 100 MG tablet TAKE 3 TABLETS (300 MG TOTAL) BY MOUTH AT BEDTIME. 270 tablet 2  . insulin aspart (NOVOLOG FLEXPEN) 100 UNIT/ML FlexPen USE AS DIRECTED PER SLIDING SCALE    . lamoTRIgine (LAMICTAL) 150 MG tablet TAKE 1 TABLET BY MOUTH EVERY DAY 30 tablet 5  . LANTUS SOLOSTAR 100 UNIT/ML Solostar Pen SMARTSIG:65 Unit(s) SUB-Q Every Night    . levothyroxine (SYNTHROID) 75 MCG tablet Take 75 mcg by mouth daily.    . metFORMIN (GLUCOPHAGE) 500 MG tablet Take by mouth.    . ondansetron (ZOFRAN) 4 MG tablet Take 1 tablet (4 mg total) by mouth every 8 (eight) hours as needed for nausea or vomiting. 30 tablet 1  . SEROQUEL 400 MG  tablet TAKE 2 TABLETS (800 MG TOTAL) BY MOUTH AT BEDTIME. BRAND MEDICALLY NECESSARY 180 tablet 4   No current facility-administered medications for this visit.      ___________________________________________________________________ Objective   Exam:  BP 130/80 (BP Location: Left Arm, Patient Position: Sitting, Cuff Size: Normal)   Pulse (!) 108   Temp 98.1 F (36.7 C)   Ht 5' 6" (1.676 m)   Wt 209 lb (94.8 kg)   BMI 33.73 kg/m  Her mother is with her for the entire visit  General: Well-appearing, ambulatory and gets on exam table without difficulty.  Normal vocal quality  Eyes: sclera anicteric, no redness  ENT: oral mucosa moist without lesions, no cervical or  supraclavicular lymphadenopathy  CV: RRR without murmur, S1/S2, no JVD, no peripheral edema  Resp: clear to auscultation bilaterally, normal RR and effort noted  GI: soft, no focal tenderness, with active bowel sounds. No guarding or palpable organomegaly noted.  Skin; warm and dry, no rash or jaundice noted  Neuro: awake, alert and oriented x 3. Normal gross motor function and fluent speech  Data:  On the outpatient record we could obtain was a follow-up office note from her surgeon.  Assessment: Encounter Diagnoses  Name Primary?  . Chronic diarrhea Yes  . Belching   . Nausea and vomiting in adult   . Personal history of colon cancer   . Heartburn     Multiple chronic digestive symptoms, difficult to put together in one diagnosis.  Probably multifactorial:  Upper digestive symptoms may be largely related to diabetes that has not been under optimal control.  If not gastroparesis, perhaps gastric dysrhythmia causing belching and nausea. Generalized abdominal pain and diarrhea perhaps an underlying IBS that worsened after surgical resection for cancer.  Consider an element of bile acid diarrhea and may be SIBO as well.  Plan:  We have requested last colonoscopy report from her surgeon and CT abdomen  report from her oncologist Zofran 4 mg every 8 hours as needed, hopefully will help with nausea, belching and diarrhea Gastric emptying study, no Zofran 5 days before. Follow-up in about 6 weeks to review records and symptoms and response to medicines.  I do not want to try to many medicines at once because it may be difficult to determine effect/side effect in that scenario. Consider trial of Questran or colestipol and/or empiric therapy for SIBO at some point.  Omeprazole OTC 20 mg once daily recommended  May need endoscopic work-up depending on symptoms and further testing and response to medicines.  Previous records will be helpful before that.  Her mother was advising her to see an endocrinologist for optimal control of her diabetes, and I agree with that.  Total time 60 minutes including chart review, in person evaluation, coordination of work-up and documentation  [name]  CC: Referring provider noted above  Record review addendum 09/18/19:  Imaging reports from [facility] include CT abdomen and pelvis report from January 2021 showing questionable liver lesion favored to be focal infiltration.  MRI abdomen August 23, 2019, no liver mass.  Diffuse hepatic steatosis.  Madelon Lips, MD  _____________________________  Record review addendum 09/26/2019: Colonoscopy report dated 10/14/2017 by Dr. Yehuda Mao in [place], [place].  Complete exam to ileocolic anastomosis, good preparation, no polyps and otherwise normal exam.  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 Tumor was [id number] with 1 of 16 nodes positive. Per the operative note, the colon was focally perforated and adherent to the omentum at the site of tumor. MMR IHC testing normal  Madelon Lips, MD   __________________________________________  Record review addendum 10/25/2019. Primary care records Dr. Greig Right, [facility] in [facility]  Labs dated 10/20/2018: CMP normal except glucose 220.  Calcium slightly low at 8.2 (normal at that lab 8.4) Total cholesterol 129, triglyceride 167, LDL 51 TSH 4.8, free T4 0.74 CEA 1.2 Hemoglobin A1c 6.6  Labs from 01/03/2019 WBC 11.8, hemoglobin 12.5, platelet 297, iron 52, ferritin 89  Labs from 08/23/2019: WBC 3.6, hemoglobin 9.6, MCV 68, platelet 167  Sodium 136, glucose 394, creatinine 1.0, AST 21, ALT 21, alkaline  phosphatase 163, total bilirubin 0.3 TSH 8.5, free T4 0.8 Hemoglobin A1c 9.3  H. Loletha Carrow, MD 10/25/19 4:53PM

## 2019-09-15 NOTE — Patient Instructions (Signed)
If you are age 52 or older, your body mass index should be between 23-30. Your Body mass index is 33.73 kg/m. If this is out of the aforementioned range listed, please consider follow up with your Primary Care Provider.  If you are age 28 or younger, your body mass index should be between 19-25. Your Body mass index is 33.73 kg/m. If this is out of the aformentioned range listed, please consider follow up with your Primary Care Provider.   You have been scheduled for a gastric emptying scan at [facility] on 10-12-2019 at [place]. Please arrive at least 15 minutes prior to your appointment for registration. Please make certain not to have anything to eat or drink after midnight the night before your test. Hold all stomach medications (ex: Zofran, phenergan, Reglan) 5 days prior to your test. If you need to reschedule your appointment, please contact radiology scheduling at [phone]. _____________________________________________________________________ A gastric-emptying study measures how long it takes for food to move through your stomach. There are several ways to measure stomach emptying. In the most common test, you eat food that contains a small amount of radioactive material. A scanner that detects the movement of the radioactive material is placed over your abdomen to monitor the rate at which food leaves your stomach. This test normally takes about 4 hours to complete. _____________________________________________________________________  Please start over the counter omeprazole 20 mg one a day.  It was a pleasure to see you today!  Dr. Loletha Carrow

## 2019-10-12 ENCOUNTER — Encounter (HOSPITAL_COMMUNITY)
Admission: RE | Admit: 2019-10-12 | Discharge: 2019-10-12 | Disposition: A | Discharge status: Home / Self Care | Payer: PRIVATE HEALTH INSURANCE | Source: Ambulatory Visit | Attending: Gastroenterology | Admitting: Gastroenterology

## 2019-10-12 ENCOUNTER — Other Ambulatory Visit: Payer: Self-pay

## 2019-10-12 DIAGNOSIS — Z85038 Personal history of other malignant neoplasm of large intestine: Secondary | ICD-10-CM | POA: Insufficient documentation

## 2019-10-12 DIAGNOSIS — R112 Nausea with vomiting, unspecified: Secondary | ICD-10-CM

## 2019-10-12 DIAGNOSIS — K529 Noninfective gastroenteritis and colitis, unspecified: Secondary | ICD-10-CM | POA: Diagnosis not present

## 2019-10-12 DIAGNOSIS — R12 Heartburn: Secondary | ICD-10-CM | POA: Insufficient documentation

## 2019-10-12 DIAGNOSIS — R142 Eructation: Secondary | ICD-10-CM | POA: Diagnosis present

## 2019-10-12 MED ORDER — TECHNETIUM TC 99M SULFUR COLLOID
1.8000 | Freq: Once | INTRAVENOUS | Status: AC | PRN
  Administered 2019-10-12: 1.8 via ORAL

## 2019-10-16 ENCOUNTER — Other Ambulatory Visit: Payer: Self-pay

## 2019-10-16 ENCOUNTER — Encounter: Payer: Self-pay | Admitting: Psychiatry

## 2019-10-16 ENCOUNTER — Ambulatory Visit (INDEPENDENT_AMBULATORY_CARE_PROVIDER_SITE_OTHER): Payer: PRIVATE HEALTH INSURANCE | Admitting: Psychiatry

## 2019-10-16 DIAGNOSIS — G3184 Mild cognitive impairment, so stated: Secondary | ICD-10-CM

## 2019-10-16 DIAGNOSIS — Z79899 Other long term (current) drug therapy: Secondary | ICD-10-CM | POA: Diagnosis not present

## 2019-10-16 DIAGNOSIS — F422 Mixed obsessional thoughts and acts: Secondary | ICD-10-CM

## 2019-10-16 DIAGNOSIS — F333 Major depressive disorder, recurrent, severe with psychotic symptoms: Secondary | ICD-10-CM

## 2019-10-16 NOTE — Progress Notes (Signed)
Shannon [name] [id number] 1965/06/21 54 y.o.    Subjective:   Patient ID:  Shannon [name] is a 54 y.o. (DOB 09/05/1965) female.  Chief Complaint:  Chief Complaint  Patient presents with  . Follow-up    mood anxiety and meds  . Sleeping Problem  . 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  Last seen November 2020 .  No meds were changed at that time.  10/16/19 the following is noted at this appt: Asked questions about sleep supplement.   Just started GI doctor.    Good overall. No mood swings. No depressive episodes.  Cry more with hormonal changes more easily.   Probably sleep too much to avoid.  Occ stress from work interferes with sleep.  Not as productive from home.  Hopes to get back to the office.  Still working from home.  Boss noticed her forgetfulness with deadlines and missing issues in emails.  Used to be sharper. Wonders if related to meds.  Can usually nap if desired for years and not seasonal.  Only anxiety is Covid related. More poor self care.   Hysterectomy December 2020.  Feel good.  Thinking is good.  Better at work.  Better productivity and quality DT feeling better mentally and physically.  Friendlier. Only situational mood effects. Patient reports stable mood and denies depressed or irritable moods. Some anxiety over it.  Patient denies difficulty with sleep maintenance.  Being some trouble falling asleep but is got off of her usual sleep-wake cycle due to the Covid virus.  Denies appetite disturbance.  Patient reports that energy and motivation have been good.  Patient denies any difficulty with concentration.  Patient denies any suicidal ideation. Anxious, obsessive thoughts minimal.  Past Psychiatric Medication Trials: HX RELAPSE PSYCHOSIS WITH GENERIC QUETIAPINE,  Citalopram, fluoxetine 80, sertraline to 50, inVega 3, Abilify 15, lamotrigine 200, Seroquel 800,  Risperdal 2 Has been under the care of the psychiatrist since March 2000  Review of Systems:  Review of Systems  Constitutional: Negative for unexpected weight change.  Gastrointestinal: Positive for abdominal pain, nausea and vomiting.  Neurological: Negative for tremors and weakness.  Psychiatric/Behavioral: Negative for agitation, behavioral problems, confusion, decreased concentration, dysphoric mood, hallucinations, self-injury, sleep disturbance and suicidal ideas. The patient is not nervous/anxious and is not hyperactive.     Medications: I have reviewed the patient's current medications.  Current Outpatient Medications  Medication Sig Dispense Refill  . atorvastatin (LIPITOR) 10 MG tablet Take 10 mg by mouth daily.  0  . Dulaglutide (TRULICITY) 1.5 0000000 SOPN INJECT 1.5MG  ONCE WEEKLY AS DIRECTED    . fluvoxaMINE (LUVOX) 100 MG tablet TAKE 3 TABLETS (300 MG TOTAL) BY MOUTH AT BEDTIME. 270 tablet 2  . insulin aspart (NOVOLOG FLEXPEN) 100 UNIT/ML FlexPen USE AS DIRECTED PER SLIDING SCALE    . lamoTRIgine (LAMICTAL) 150 MG tablet TAKE 1 TABLET BY MOUTH EVERY DAY 30 tablet 5  . LANTUS SOLOSTAR 100 UNIT/ML Solostar Pen SMARTSIG:65 Unit(s) SUB-Q Every Night    . levothyroxine (SYNTHROID) 75 MCG tablet Take 75 mcg by mouth daily.    . metFORMIN (GLUCOPHAGE) 500 MG tablet Take by mouth.    . ondansetron (ZOFRAN) 4 MG tablet Take 1 tablet (4 mg total) by mouth every 8 (eight) hours as needed for nausea or vomiting. 30 tablet 1  . SEROQUEL 400 MG tablet TAKE 2 TABLETS (800 MG TOTAL) BY  MOUTH AT BEDTIME. BRAND MEDICALLY NECESSARY 180 tablet 4   No current facility-administered medications for this visit.    Medication Side Effects: None  Allergies:  Allergies  Allergen Reactions  . Clindamycin/Lincomycin Rash  . Penicillins Rash    Past Medical History:  Diagnosis Date  . Anemia   . Anxiety   . Blood clot in vein   . Bowel obstruction ([place])   . Colon cancer [facility]) 2018   Dr  Corena Pilgrim in [place]   . Depression   . History of chemotherapy   . Hypothyroidism   . Kidney stones   . Obesity     Family History  Problem Relation Age of Onset  . Irritable bowel syndrome Mother   . Colon polyps Father   . Breast cancer Maternal Grandmother   . Diabetes Maternal Grandmother   . Diabetes Maternal Grandfather   . Breast cancer Paternal Grandmother   . Colon polyps Maternal Uncle   . Stomach cancer Neg Hx   . Pancreatic cancer Neg Hx   . Esophageal cancer Neg Hx   . Colon cancer Neg Hx     Social History   Socioeconomic History  . Marital status: Widowed    Spouse name: Not on file  . Number of children: Not on file  . Years of education: Not on file  . Highest education level: Not on file  Occupational History  . Occupation: care coordinator   Tobacco Use  . Smoking status: Never Smoker  . Smokeless tobacco: Never Used  Substance and Sexual Activity  . Alcohol use: Yes    Comment: rare  . Drug use: Never  . Sexual activity: Not Currently  Other Topics Concern  . Not on file  Social History Narrative  . Not on file   Social Determinants of Health   Financial Resource Strain:   . Difficulty of Paying Living Expenses:   Food Insecurity:   . Worried About Charity fundraiser in the Last Year:   . Arboriculturist in the Last Year:   Transportation Needs:   . Film/video editor (Medical):   Marland Kitchen Lack of Transportation (Non-Medical):   Physical Activity:   . Days of Exercise per Week:   . Minutes of Exercise per Session:   Stress:   . Feeling of Stress :   Social Connections:   . Frequency of Communication with Friends and Family:   . Frequency of Social Gatherings with Friends and Family:   . Attends Religious Services:   . Active Member of Clubs or Organizations:   . Attends Archivist Meetings:   Marland Kitchen Marital Status:   Intimate Partner Violence:   . Fear of Current or Ex-Partner:   . Emotionally Abused:   Marland Kitchen Physically  Abused:   . Sexually Abused:     Past Medical History, Surgical history, Social history, and Family history were reviewed and updated as appropriate.   Please see review of systems for further details on the patient's review from today.   Objective:   Physical Exam:  There were no vitals taken for this visit.  Physical Exam Constitutional:      General: She is not in acute distress.    Appearance: She is well-developed.  Musculoskeletal:        General: No deformity.  Neurological:     Mental Status: She is alert and oriented to person, place, and time.     Motor: No tremor.     Coordination:  Coordination normal.     Gait: Gait normal.  Psychiatric:        Attention and Perception: She is attentive.        Mood and Affect: Mood is anxious. Mood is not depressed. Affect is not labile, blunt, angry or inappropriate.        Speech: Speech normal.        Behavior: Behavior normal.        Thought Content: Thought content normal. Thought content does not include homicidal or suicidal ideation. Thought content does not include homicidal or suicidal plan.        Cognition and Memory: Cognition normal.        Judgment: Judgment normal.     Comments: Insight intact. No auditory or visual hallucinations. No delusions.  Mild mod intermittent anxierty     Lab Review:  No results found for: NA, K, CL, CO2, GLUCOSE, BUN, CREATININE, CALCIUM, PROT, ALBUMIN, AST, ALT, ALKPHOS, BILITOT, GFRNONAA, GFRAA  No results found for: WBC, RBC, HGB, HCT, PLT, MCV, MCH, MCHC, RDW, LYMPHSABS, MONOABS, EOSABS, BASOSABS  No results found for: POCLITH, LITHIUM   No results found for: PHENYTOIN, PHENOBARB, VALPROATE, CBMZ   .res Assessment: Plan:    Severe recurrent major depression with psychotic features ([place]) - Plan: B12 and Folate Panel  Mixed obsessional thoughts and acts  Mild cognitive impairment - Plan: B12 and Folate Panel  Pharmacologic therapy - Plan: B12 and Folate Panel   Overall  mood and anxiety are managed.  Only thing causing any problems is Covid.    Discussed potential metabolic side effects associated with atypical antipsychotics, as well as potential risk for movement side effects. Advised pt to contact office if movement side effects occur.   Counseled patient regarding potential benefits, risks, and side effects of Lamictal to include potential risk of Stevens-Johnson syndrome. Advised patient to stop taking Lamictal and contact office immediately if rash develops and to seek urgent medical attention if rash is severe and/or spreading quickly.  She's benefitting from meds and is satisfied.  Continue current meds. HX RELAPSE PSYCHOSIS WITH GENERIC QUETIAPINE,  Check B12 and folate DT memory concerns and potential for lamotrigine to affect these. Other options for memory concerns if needed.  Disc the off-label use of N-Acetylcysteine at 600 mg daily to help with mild cognitive problems.  It can be combined with a B-complex vitamin as the B-12 and folate have been shown to sometimes enhance the effect.  FU 6 mos  Lynder Parents, MD, DFAPA   Please see After Visit Summary for patient specific instructions.  Future Appointments  Date Time Provider [place]  11/21/2019  3:40 PM Danis, Kirke Corin, MD [facility]    Orders Placed This Encounter  Procedures  . B12 and Folate Panel      -------------------------------

## 2019-10-16 NOTE — Patient Instructions (Signed)
NAC (N-Acetylcysteine) at 600 mg daily to help with mild cognitive problems.  It can be combined with a B-complex vitamin as the B-12 and folate have been shown to sometimes enhance the effect.

## 2019-10-24 ENCOUNTER — Other Ambulatory Visit: Payer: Self-pay

## 2019-10-24 DIAGNOSIS — K529 Noninfective gastroenteritis and colitis, unspecified: Secondary | ICD-10-CM

## 2019-10-24 MED ORDER — METRONIDAZOLE 250 MG PO TABS: 250.0000 mg | ORAL_TABLET | Freq: Three times a day (TID) | ORAL | 0 refills | Status: DC

## 2019-10-27 DIAGNOSIS — C182 Malignant neoplasm of ascending colon: Secondary | ICD-10-CM

## 2019-11-21 ENCOUNTER — Encounter: Payer: Self-pay | Admitting: Gastroenterology

## 2019-11-21 ENCOUNTER — Ambulatory Visit (INDEPENDENT_AMBULATORY_CARE_PROVIDER_SITE_OTHER): Payer: PRIVATE HEALTH INSURANCE | Admitting: Gastroenterology

## 2019-11-21 ENCOUNTER — Other Ambulatory Visit (INDEPENDENT_AMBULATORY_CARE_PROVIDER_SITE_OTHER): Payer: PRIVATE HEALTH INSURANCE

## 2019-11-21 VITALS — BP 122/76 | HR 112 | Ht 66.0 in | Wt 210.4 lb

## 2019-11-21 DIAGNOSIS — R1084 Generalized abdominal pain: Secondary | ICD-10-CM

## 2019-11-21 DIAGNOSIS — R142 Eructation: Secondary | ICD-10-CM

## 2019-11-21 DIAGNOSIS — R140 Abdominal distension (gaseous): Secondary | ICD-10-CM | POA: Diagnosis not present

## 2019-11-21 DIAGNOSIS — Z85038 Personal history of other malignant neoplasm of large intestine: Secondary | ICD-10-CM | POA: Diagnosis not present

## 2019-11-21 DIAGNOSIS — K529 Noninfective gastroenteritis and colitis, unspecified: Secondary | ICD-10-CM | POA: Diagnosis not present

## 2019-11-21 LAB — CBC WITH DIFFERENTIAL/PLATELET
Basophils Absolute: 0 10*3/uL (ref 0.0–0.1)
Basophils Relative: 0.5 % (ref 0.0–3.0)
Eosinophils Absolute: 0.1 10*3/uL (ref 0.0–0.7)
Eosinophils Relative: 1.2 % (ref 0.0–5.0)
HCT: 36.1 % (ref 36.0–46.0)
Hemoglobin: 11.6 g/dL — ABNORMAL LOW (ref 12.0–15.0)
Lymphocytes Relative: 22.1 % (ref 12.0–46.0)
Lymphs Abs: 1.2 10*3/uL (ref 0.7–4.0)
MCHC: 32.2 g/dL (ref 30.0–36.0)
MCV: 75.1 fl — ABNORMAL LOW (ref 78.0–100.0)
Monocytes Absolute: 0.4 10*3/uL (ref 0.1–1.0)
Monocytes Relative: 7.6 % (ref 3.0–12.0)
Neutro Abs: 3.9 10*3/uL (ref 1.4–7.7)
Neutrophils Relative %: 68.6 % (ref 43.0–77.0)
Platelets: 182 10*3/uL (ref 150.0–400.0)
RBC: 4.81 Mil/uL (ref 3.87–5.11)
RDW: 21.6 % — ABNORMAL HIGH (ref 11.5–15.5)
WBC: 5.7 10*3/uL (ref 4.0–10.5)

## 2019-11-21 LAB — IGA: IgA: 138 mg/dL (ref 68–378)

## 2019-11-21 MED ORDER — HYOSCYAMINE SULFATE 0.125 MG SL SUBL
0.1250 mg | SUBLINGUAL_TABLET | Freq: Four times a day (QID) | SUBLINGUAL | 1 refills | Status: DC | PRN
Start: 2019-11-21 — End: 2020-06-13

## 2019-11-21 NOTE — Patient Instructions (Addendum)
If you are age 52 or older, your body mass index should be between 23-30. Your Body mass index is 33.96 kg/m. If this is out of the aforementioned range listed, please consider follow up with your Primary Care Provider.  If you are age 52 or younger, your body mass index should be between 19-25. Your Body mass index is 33.96 kg/m. If this is out of the aformentioned range listed, please consider follow up with your Primary Care Provider.   Your provider has requested that you go to the basement level for lab work before leaving today. Press "B" on the elevator. The lab is located at the first door on the left as you exit the elevator.  Due to recent changes in healthcare laws, you may see the results of your imaging and laboratory studies on MyChart before your provider has had a chance to review them.  We understand that in some cases there may be results that are confusing or concerning to you. Not all laboratory results come back in the same time frame and the provider may be waiting for multiple results in order to interpret others.  Please give [name] 48 hours in order for your provider to thoroughly review all the results before contacting the office for clarification of your results.   It was a pleasure to see you today!  Dr. Loletha Carrow   Food Guidelines for those with chronic digestive trouble:  Many people have difficulty digesting certain foods, causing a variety of distressing and embarrassing symptoms such as abdominal pain, bloating and gas.  These foods may need to be avoided or consumed in small amounts.  Here are some tips that might be helpful for you.  1.   Lactose intolerance is the difficulty or complete inability to digest lactose, the natural sugar in milk and anything made from milk.  This condition is harmless, common, and can begin any time during life.  Some people can digest a modest amount of lactose while others cannot tolerate any.  Also, not all dairy products contain equal  amounts of lactose.  For example, hard cheeses such as parmesan have less lactose than soft cheeses such as cheddar.  Yogurt has less lactose than milk or cheese.  Many packaged foods (even many brands of bread) have milk, so read ingredient lists carefully.  It is difficult to test for lactose intolerance, so just try avoiding lactose as much as possible for a week and see what happens with your symptoms.  If you seem to be lactose intolerant, the best plan is to avoid it (but make sure you get calcium from another source).  The next best thing is to use lactase enzyme supplements, available over the counter everywhere.  Just know that many lactose intolerant people need to take several tablets with each serving of dairy to avoid symptoms.  Lastly, a lot of restaurant food is made with milk or butter.  Many are things you might not suspect, such as mashed potatoes, rice and pasta (cooked with butter) and "grilled" items.  If you are lactose intolerant, it never hurts to ask your server what has milk or butter.  2.   Fiber is an important part of your diet, but not all fiber is well-tolerated.  Insoluble fiber such as bran is often consumed by normal gut bacteria and converted into gas.  Soluble fiber such as oats, squash, carrots and green beans are typically tolerated better.  3.   Some types of carbohydrates can be poorly  digested.  Examples include: fructose (apples, cherries, pears, raisins and other dried fruits), fructans (onions, zucchini, large amounts of wheat), sorbitol/mannitol/xylitol and sucralose/Splenda (common artificial sweeteners), and raffinose (lentils, broccoli, cabbage, asparagus, brussel sprouts, many types of beans).  Do a Development worker, community for [employer] and you will find helpful information. Beano, a dietary supplement, will often help with raffinose-containing foods.  As with lactase tablets, you may need several per serving.  4.   Whenever possible, avoid processed food&meats and  chemical additives.  High fructose corn syrup, a common sweetener, may be difficult to digest.  Eggs and soy (comes from the soybean, and added to many foods now) are other common bloating/gassy foods.  5.  Regarding gluten:  gluten is a protein mainly found in wheat, but also rye and barley.  There is a condition called celiac sprue, which is an inflammatory reaction in the small intestine causing a variety of digestive symptoms.  Blood testing is highly reliable to look for this condition, and sometimes upper endoscopy with small bowel biopsies may be necessary to make the diagnosis.  Many patients who test negative for celiac sprue report improvement in their digestive symptoms when they switch to a gluten-free diet.  However, in these "non-celiac gluten sensitive" patients, the true role of gluten in their symptoms is unclear.  Reducing carbohydrates in general may decrease the gas and bloating caused when gut bacteria consume carbs. Also, some of these patients may actually be intolerant of the baker's yeast in bread products rather than the gluten.  Flatbread and other reduced yeast breads might therefore be tolerated.  There is no specific testing available for most food intolerances, which are discovered mainly by dietary elimination.  Please do not embark on a gluten free diet unless directed by your doctor, as it is highly restrictive, and may lead to nutritional deficiencies if not carefully monitored.  Lastly, beware of internet claims offering "personalized" tests for food intolerances.  Such testing has no reliable scientific evidence to support its reliability and correlation to symptoms.    6.  The best advice is old advice, especially for those with chronic digestive trouble - try to eat "clean".  Balanced diet, avoid processed food, plenty of fruits and vegetables, cut down the sugar, minimal alcohol, avoid tobacco. Make time to care for yourself, get enough sleep, exercise when you can,  reduce stress.  Your guts will thank you for it.   - Dr. [facility]   _____________________________________________

## 2019-11-21 NOTE — Progress Notes (Signed)
[facility] Progress Note  Chief Complaint: Generalized abdominal pain, bloating and diarrhea  Subjective  History: Chronic digestive issues outlined in 09/15/19 consult note - addendum made after more records rec'd and reviewed. Rx zofran, ordered [facility] PCP labs show Hgb A1c 6.6 in 09/2018  08/23/19 PCP labs:  WBC 3.6, Hgb 9.6, plt 167,  TSH and free T4 nml,   Hgb A1c 9.3  After [facility] report, Rx 14 days metronidazole for ? SIBO _______--  Shannon [name] was again here with her mother for follow-up with GI symptoms.  She admits that because her schedule is not "regular", she took the metronidazole differently than directed.  She may take 1 or 2 tablets in a day, so the treatment course lasted longer.  She has had a few episodes of abdominal pain since then lasting 1 to 2 days.  It is bandlike crampy pain as before.  She had not noticed any changes in the bloating gas or diarrhea after treatment.  She is trying to get in with a new endocrinologist in the area. Nausea and belching perhaps improved lately. She still has several semiformed to loose BMs per day.  ROS: Cardiovascular:  no chest pain Respiratory: no dyspnea Mood stable  The patient's Past Medical, Family and Social History were reviewed and are on file in the EMR.  Objective:  Med list reviewed  Current Outpatient Medications:    atorvastatin (LIPITOR) 10 MG tablet, Take 10 mg by mouth daily., Disp: , Rfl: 0   Dulaglutide (TRULICITY) 1.5 AY/3.0ZS SOPN, INJECT 1.5MG  ONCE WEEKLY AS DIRECTED, Disp: , Rfl:    fluvoxaMINE (LUVOX) 100 MG tablet, TAKE 3 TABLETS (300 MG TOTAL) BY MOUTH AT BEDTIME., Disp: 270 tablet, Rfl: 2   insulin aspart (NOVOLOG FLEXPEN) 100 UNIT/ML FlexPen, USE AS DIRECTED PER SLIDING SCALE, Disp: , Rfl:    lamoTRIgine (LAMICTAL) 150 MG tablet, TAKE 1 TABLET BY MOUTH EVERY DAY, Disp: 30 tablet, Rfl: 5   LANTUS SOLOSTAR 100 UNIT/ML Solostar Pen, daily. Every AM, Disp: , Rfl:    levothyroxine (SYNTHROID) 75  MCG tablet, Take 75 mcg by mouth daily., Disp: , Rfl:    metFORMIN (GLUCOPHAGE) 500 MG tablet, Take 500 mg by mouth. One in the AM 2 tablets in the PM, Disp: , Rfl:    metroNIDAZOLE (FLAGYL) 250 MG tablet, Take 1 tablet (250 mg total) by mouth 3 (three) times daily., Disp: 42 tablet, Rfl: 0   ondansetron (ZOFRAN) 4 MG tablet, Take 1 tablet (4 mg total) by mouth every 8 (eight) hours as needed for nausea or vomiting., Disp: 30 tablet, Rfl: 1   SEROQUEL 400 MG tablet, TAKE 2 TABLETS (800 MG TOTAL) BY MOUTH AT BEDTIME. BRAND MEDICALLY NECESSARY, Disp: 180 tablet, Rfl: 4   hyoscyamine (LEVSIN SL) 0.125 MG SL tablet, Place 1 tablet (0.125 mg total) under the tongue every 6 (six) hours as needed., Disp: 45 tablet, Rfl: 1   Vital signs in last 24 hrs: Vitals:   11/21/19 1552  BP: 122/76  Pulse: (!) 112  SpO2: 98%    Physical Exam  Well-appearing, somewhat restricted affect  HEENT: sclera anicteric, oral mucosa moist without lesions  Neck: supple, no thyromegaly, JVD or lymphadenopathy  Cardiac: RRR without murmurs, S1S2 heard, no peripheral edema  Pulm: clear to auscultation bilaterally, normal RR and effort noted  Abdomen: soft, no tenderness, with active bowel sounds. No guarding or palpable hepatosplenomegaly.  Skin; warm and dry, no jaundice or rash  Labs:  Extensive labs and  records received and reviewed after last visit and included above and also as an addendum to her initial office consult note. ___________________________________________ Radiologic studies:  Recent nml GES ____________________________________________ Other:   _____________________________________________ Assessment & Plan  Assessment: Encounter Diagnoses  Name Primary?   Chronic diarrhea Yes   Belching    Personal history of colon cancer    Abdominal bloating    Generalized abdominal pain    Multiple chronic digestive symptoms suggesting a pre-existing IBS, probably exacerbated after  right hemicolectomy and with suboptimally controlled diabetes affecting GI motility.  Empiric SIBO treatment with metronidazole does not appear to have significantly improved symptoms, not withstanding that she did not take it as directed. She was wondering about some dietary advice "to be healthy", so I gave her some written advice regarding bloating and gas. I strongly encouraged her to follow through with endocrinology evaluation, as I think that a control of her diabetes would help her overall health in both the short and long run, decrease her cardiovascular risk, and I hope to improve her GI symptoms as well. Trial of Levsin was given today for as needed use.  I would prefer not to use cholestyramine if possible because I am concerned that it may be cumbersome for her to use, she does not seem inclined to take nonessential meds on a regular basis, and it may affect absorption of her essential meds.  Lastly, I asked her to address anemia with her hematologist/oncologist.  She does not know if anyone has checked her iron levels, but seems to feel like she was told they were low at some point she may need IV iron.  At some point she was taking oral iron but it does not agree with her digestive tract.  Unknown if B12 is been checked.  Plan: Celiac antibodies, iron levels and B12 level today. Hyoscyamine prescribed as noted above See me as needed.   40 minutes were spent on this encounter (including chart review, history/exam, counseling/coordination of care, and documentation)  [name]

## 2019-11-22 LAB — IBC + FERRITIN
Ferritin: 7.3 ng/mL — ABNORMAL LOW (ref 10.0–291.0)
Iron: 47 ug/dL (ref 42–145)
Saturation Ratios: 8.5 % — ABNORMAL LOW (ref 20.0–50.0)
Transferrin: 396 mg/dL — ABNORMAL HIGH (ref 212.0–360.0)

## 2019-11-22 LAB — VITAMIN B12: Vitamin B-12: 216 pg/mL (ref 211–911)

## 2019-11-22 LAB — FOLATE: Folate: >24.8 ng/mL (ref >5.9)

## 2019-11-27 ENCOUNTER — Other Ambulatory Visit: Payer: Self-pay | Admitting: Psychiatry

## 2020-01-17 ENCOUNTER — Ambulatory Visit: Payer: PRIVATE HEALTH INSURANCE | Admitting: Psychiatry

## 2020-02-08 ENCOUNTER — Telehealth: Payer: Self-pay | Admitting: Psychiatry

## 2020-02-08 ENCOUNTER — Other Ambulatory Visit: Payer: Self-pay

## 2020-02-08 DIAGNOSIS — F333 Major depressive disorder, recurrent, severe with psychotic symptoms: Secondary | ICD-10-CM

## 2020-02-08 MED ORDER — SEROQUEL 400 MG PO TABS: 800.0000 mg | ORAL_TABLET | Freq: Every day | ORAL | 4 refills | Status: DC

## 2020-02-08 NOTE — Telephone Encounter (Signed)
RX sent

## 2020-02-08 NOTE — Telephone Encounter (Signed)
Pt called requesting a refill on Seroquil. [facility] [address] in [place]. Next appt is 03/18/20.

## 2020-03-01 ENCOUNTER — Other Ambulatory Visit: Payer: Self-pay | Admitting: Psychiatry

## 2020-03-01 NOTE — Telephone Encounter (Signed)
Please review

## 2020-03-16 LAB — B12 AND FOLATE PANEL
Folate: >20 ng/mL (ref >3.0)
Vitamin B-12: 266 pg/mL (ref 232–1245)

## 2020-03-18 ENCOUNTER — Ambulatory Visit (INDEPENDENT_AMBULATORY_CARE_PROVIDER_SITE_OTHER): Payer: PRIVATE HEALTH INSURANCE | Admitting: Psychiatry

## 2020-03-18 ENCOUNTER — Other Ambulatory Visit: Payer: Self-pay

## 2020-03-18 ENCOUNTER — Encounter: Payer: Self-pay | Admitting: Psychiatry

## 2020-03-18 DIAGNOSIS — F422 Mixed obsessional thoughts and acts: Secondary | ICD-10-CM

## 2020-03-18 DIAGNOSIS — G3184 Mild cognitive impairment, so stated: Secondary | ICD-10-CM | POA: Diagnosis not present

## 2020-03-18 DIAGNOSIS — F333 Major depressive disorder, recurrent, severe with psychotic symptoms: Secondary | ICD-10-CM

## 2020-03-18 MED ORDER — LAMOTRIGINE 150 MG PO TABS: 150.0000 mg | ORAL_TABLET | Freq: Every day | ORAL | 3 refills | Status: DC

## 2020-03-18 MED ORDER — FLUVOXAMINE MALEATE 100 MG PO TABS: 300.0000 mg | ORAL_TABLET | Freq: Every day | ORAL | 1 refills | Status: DC

## 2020-03-18 NOTE — Progress Notes (Signed)
Shannon [name] [id number] [birth year] 54 y.o.    Subjective:   Patient ID:  Shannon [name] is a 54 y.o. (DOB 1965-08-12) female.  Chief Complaint:  Chief Complaint  Patient presents with  . Follow-up    mood and meds    HPI Shannon [name] presents  today for follow-up of major depression with psychotic features and OCD.  Last seen November 2020 .  No meds were changed at that time.  10/16/19 the following is noted at this appt: Asked questions about sleep supplement.   Just started GI doctor.   Good overall. No mood swings. No depressive episodes.  Cry more with hormonal changes more easily.   Probably sleep too much to avoid.  Occ stress from work interferes with sleep.  Not as productive from home.  Hopes to get back to the office.  Still working from home.  Boss noticed her forgetfulness with deadlines and missing issues in emails.  Used to be sharper. Wonders if related to meds.  Can usually nap if desired for years and not seasonal.  Only anxiety is Covid related. More poor self care.   Hysterectomy December 2020. No med changes  03/18/2020 appointment with the following noted: July had Covid and both parents and her father died from [place]. Able to help mom with arrangements. Required one iron infusion this year. Never took NAC bc it was too hard to take. Sleep, eating schedule is irregular. More productive at home helps her feel better.  Mood surprisingly good.  Thinking is good.  Better at work.  Better productivity and quality DT feeling better mentally and physically.  Friendlier. Only situational mood effects. Patient reports stable mood and denies depressed or irritable moods. Some anxiety over it.  Patient denies difficulty with sleep maintenance.  Being some trouble falling asleep but is got off of her usual sleep-wake cycle due to the Covid virus.  Denies appetite disturbance.  Patient reports that energy and motivation have been good.   Patient denies any difficulty with concentration.  Patient denies any suicidal ideation. Anxious, obsessive thoughts minimal.  Past Psychiatric Medication Trials: HX RELAPSE PSYCHOSIS WITH GENERIC QUETIAPINE,  Citalopram, fluoxetine 80, sertraline to 50, inVega 3, Abilify 15, lamotrigine 200, Seroquel 800, Risperdal 2 Has been under the care of the psychiatrist since March 2000  Review of Systems:  Review of Systems  Constitutional: Negative for unexpected weight change.  Cardiovascular: Negative for palpitations.  Gastrointestinal: Positive for abdominal pain, nausea and vomiting.  Neurological: Negative for tremors and weakness.  Psychiatric/Behavioral: Negative for agitation, behavioral problems, confusion, decreased concentration, dysphoric mood, hallucinations, self-injury, sleep disturbance and suicidal ideas. The patient is not nervous/anxious and is not hyperactive.     Medications: I have reviewed the patient's current medications.  Current Outpatient Medications  Medication Sig Dispense Refill  . atorvastatin (LIPITOR) 10 MG tablet Take 10 mg by mouth daily.  0  . Dulaglutide (TRULICITY) 1.5 NW/2.9FA SOPN INJECT 1.5MG  ONCE WEEKLY AS DIRECTED    . hyoscyamine (LEVSIN SL) 0.125 MG SL tablet Place 1 tablet (0.125 mg total) under the tongue every 6 (six) hours as needed. 45 tablet 1  . insulin aspart (NOVOLOG FLEXPEN) 100 UNIT/ML FlexPen USE AS DIRECTED PER SLIDING SCALE    . LANTUS SOLOSTAR 100 UNIT/ML Solostar Pen daily. Every AM    . levothyroxine (SYNTHROID) 75 MCG tablet Take 75 mcg by mouth daily.    . metFORMIN (GLUCOPHAGE) 500 MG tablet Take 500 mg by mouth.  One in the AM 2 tablets in the PM    . metroNIDAZOLE (FLAGYL) 250 MG tablet Take 1 tablet (250 mg total) by mouth 3 (three) times daily. 42 tablet 0  . ondansetron (ZOFRAN) 4 MG tablet Take 1 tablet (4 mg total) by mouth every 8 (eight) hours as needed for nausea or vomiting. 30 tablet 1  . SEROQUEL 400 MG tablet Take 2  tablets (800 mg total) by mouth at bedtime. Brand medically necessary 180 tablet 4  . fluvoxaMINE (LUVOX) 100 MG tablet Take 3 tablets (300 mg total) by mouth at bedtime. 270 tablet 1  . lamoTRIgine (LAMICTAL) 150 MG tablet Take 1 tablet (150 mg total) by mouth daily. 90 tablet 3   No current facility-administered medications for this visit.    Medication Side Effects: None  Allergies:  Allergies  Allergen Reactions  . Clindamycin/Lincomycin Rash  . Penicillins Rash    Past Medical History:  Diagnosis Date  . Anemia   . Anxiety   . Blood clot in vein   . Bowel obstruction ([place])   . Colon cancer [facility]) 2018   Dr Corena Pilgrim in [place]   . Depression   . History of chemotherapy   . Hypothyroidism   . Kidney stones   . Obesity     Family History  Problem Relation Age of Onset  . Irritable bowel syndrome Mother   . Colon polyps Father   . Breast cancer Maternal Grandmother   . Diabetes Maternal Grandmother   . Diabetes Maternal Grandfather   . Breast cancer Paternal Grandmother   . Colon polyps Maternal Uncle   . Stomach cancer Neg Hx   . Pancreatic cancer Neg Hx   . Esophageal cancer Neg Hx   . Colon cancer Neg Hx     Social History   Socioeconomic History  . Marital status: Widowed    Spouse name: Not on file  . Number of children: Not on file  . Years of education: Not on file  . Highest education level: Not on file  Occupational History  . Occupation: care coordinator   Tobacco Use  . Smoking status: Never Smoker  . Smokeless tobacco: Never Used  Vaping Use  . Vaping Use: Never used  Substance and Sexual Activity  . Alcohol use: Yes    Comment: rare  . Drug use: Never  . Sexual activity: Not Currently  Other Topics Concern  . Not on file  Social History Narrative  . Not on file   Social Determinants of Health   Financial Resource Strain:   . Difficulty of Paying Living Expenses: Not on file  Food Insecurity:   . Worried About Sales executive in the Last Year: Not on file  . Ran Out of Food in the Last Year: Not on file  Transportation Needs:   . Lack of Transportation (Medical): Not on file  . Lack of Transportation (Non-Medical): Not on file  Physical Activity:   . Days of Exercise per Week: Not on file  . Minutes of Exercise per Session: Not on file  Stress:   . Feeling of Stress : Not on file  Social Connections:   . Frequency of Communication with Friends and Family: Not on file  . Frequency of Social Gatherings with Friends and Family: Not on file  . Attends Religious Services: Not on file  . Active Member of Clubs or Organizations: Not on file  . Attends Archivist Meetings: Not on file  .  Marital Status: Not on file  Intimate Partner Violence:   . Fear of Current or Ex-Partner: Not on file  . Emotionally Abused: Not on file  . Physically Abused: Not on file  . Sexually Abused: Not on file    Past Medical History, Surgical history, Social history, and Family history were reviewed and updated as appropriate.   Please see review of systems for further details on the patient's review from today.   Objective:   Physical Exam:  There were no vitals taken for this visit.  Physical Exam Constitutional:      General: She is not in acute distress.    Appearance: She is well-developed.  Musculoskeletal:        General: No deformity.  Neurological:     Mental Status: She is alert and oriented to person, place, and time.     Motor: No tremor.     Coordination: Coordination normal.     Gait: Gait normal.  Psychiatric:        Attention and Perception: She is attentive.        Mood and Affect: Mood is anxious. Mood is not depressed. Affect is not labile, blunt, angry or inappropriate.        Speech: Speech normal. Speech is not rapid and pressured.        Behavior: Behavior normal.        Thought Content: Thought content normal. Thought content does not include homicidal or suicidal ideation.  Thought content does not include homicidal or suicidal plan.        Cognition and Memory: Cognition normal.        Judgment: Judgment normal.     Comments: Insight intact. No auditory or visual hallucinations. No delusions.  Mild mod intermittent anxierty     Lab Review:  No results found for: NA, K, CL, CO2, GLUCOSE, BUN, CREATININE, CALCIUM, PROT, ALBUMIN, AST, ALT, ALKPHOS, BILITOT, GFRNONAA, GFRAA     Component Value Date/Time   WBC 5.7 11/21/2019 1700   RBC 4.81 11/21/2019 1700   HGB 11.6 (L) 11/21/2019 1700   HCT 36.1 11/21/2019 1700   PLT 182.0 11/21/2019 1700   MCV 75.1 (L) 11/21/2019 1700   MCHC 32.2 11/21/2019 1700   RDW 21.6 (H) 11/21/2019 1700   LYMPHSABS 1.2 11/21/2019 1700   MONOABS 0.4 11/21/2019 1700   EOSABS 0.1 11/21/2019 1700   BASOSABS 0.0 11/21/2019 1700    No results found for: POCLITH, LITHIUM   No results found for: PHENYTOIN, PHENOBARB, VALPROATE, CBMZ   .res Assessment: Plan:    Severe recurrent major depression with psychotic features ([place]) - Plan: lamoTRIgine (LAMICTAL) 150 MG tablet  Mixed obsessional thoughts and acts - Plan: fluvoxaMINE (LUVOX) 100 MG tablet  Mild cognitive impairment   Overall mood and anxiety are managed.  Only thing causing any problems is Covid.    Discussed potential metabolic side effects associated with atypical antipsychotics, as well as potential risk for movement side effects. Advised pt to contact office if movement side effects occur.   Counseled patient regarding potential benefits, risks, and side effects of Lamictal to include potential risk of Stevens-Johnson syndrome. Advised patient to stop taking Lamictal and contact office immediately if rash develops and to seek urgent medical attention if rash is severe and/or spreading quickly.  She's benefitting from meds and is satisfied.  Continue current meds. HX RELAPSE PSYCHOSIS WITH GENERIC QUETIAPINE, Continue quetiapine 800 mg HS and lamotrigine 150 mg  daily and Luvox 300 mg daily  Checked B12 low normal and folate level is normal DT memory concerns and potential for lamotrigine to affect these. Other options for memory concerns if needed.  Disc the off-label use of N-Acetylcysteine at 600 mg daily to help with mild cognitive problems.  It can be combined with a B-complex vitamin as the B-12  have been shown to sometimes enhance the effect. Rec B12 since level is borderline low.  FU 6 mos  Lynder Parents, MD, DFAPA   Please see After Visit Summary for patient specific instructions.  No future appointments.  No orders of the defined types were placed in this encounter.     -------------------------------

## 2020-03-31 ENCOUNTER — Other Ambulatory Visit: Payer: Self-pay | Admitting: Psychiatry

## 2020-03-31 DIAGNOSIS — F333 Major depressive disorder, recurrent, severe with psychotic symptoms: Secondary | ICD-10-CM

## 2020-04-28 ENCOUNTER — Other Ambulatory Visit: Payer: Self-pay | Admitting: Oncology

## 2020-04-28 DIAGNOSIS — C182 Malignant neoplasm of ascending colon: Secondary | ICD-10-CM

## 2020-04-28 NOTE — Progress Notes (Signed)
[facility]  [address] [place],  [place]  [address] [phone]  Clinic Day:  04/29/2020  Referring physician: Greig Right, MD   HISTORY OF PRESENT ILLNESS:  The patient is a 54 y.o. female with stage IIIC (T4b N1 M0) colon cancer, status post a right hemicolectomy in early October 2018.  She completed all 12 cycles of adjuvant FOLFOX chemotherapy in April 2019.  Scans done since then have not shown any evidence of disease recurrence.  In the past, she has had an elevated CEA level that has since returned to normal.  She comes in today or routine followup.  Since her last visit, she has been doing well.  She still has some GI issues, including nausea and diarrhea.  She continues to see her gastroenterologist in [place] for these issues.  With respect to her colon cancer history, she denies having any new symptoms or findings that concern her for disease recurrence.  On another note, previous studies have shown this patient have 30% lifetime chance of developing breast cancer.  She just had her mammogram today, which showed no evidence of any disease.    PHYSICAL EXAM:  Blood pressure 129/77, pulse 100, temperature 97.9 F (36.6 C), resp. rate 16, height 5\' 8"  (1.727 m), weight 208 lb 14.4 oz (94.8 kg), SpO2 95 %. Wt Readings from Last 3 Encounters:  04/29/20 208 lb 14.4 oz (94.8 kg)  11/21/19 210 lb 6.4 oz (95.4 kg)  09/15/19 209 lb (94.8 kg)   Body mass index is 31.76 kg/m. Performance status (ECOG): 0 - Asymptomatic Physical Exam Constitutional:      Appearance: Normal appearance. She is not ill-appearing.  HENT:     Mouth/Throat:     Mouth: Mucous membranes are moist.     Pharynx: Oropharynx is clear. No oropharyngeal exudate or posterior oropharyngeal erythema.  Cardiovascular:     Rate and Rhythm: Normal rate and regular rhythm.     Heart sounds: No murmur heard.  No friction rub. No gallop.   Pulmonary:     Effort: Pulmonary  effort is normal. No respiratory distress.     Breath sounds: Normal breath sounds. No wheezing, rhonchi or rales.  Abdominal:     General: Bowel sounds are normal. There is no distension.     Palpations: Abdomen is soft. There is no mass.     Tenderness: There is no abdominal tenderness.  Musculoskeletal:        General: No swelling.     Right lower leg: No edema.     Left lower leg: No edema.  Lymphadenopathy:     Cervical: No cervical adenopathy.     Upper Body:     Right upper body: No supraclavicular or axillary adenopathy.     Left upper body: No supraclavicular or axillary adenopathy.     Lower Body: No right inguinal adenopathy. No left inguinal adenopathy.  Skin:    General: Skin is warm.     Coloration: Skin is not jaundiced.     Findings: No lesion or rash.  Neurological:     General: No focal deficit present.     Mental Status: She is alert and oriented to person, place, and time. Mental status is at baseline.     Cranial Nerves: Cranial nerves are intact.  Psychiatric:        Mood and Affect: Mood normal.        Behavior: Behavior normal.        Thought  Content: Thought content normal.     LABS:   CBC Latest Ref Rng & Units 04/29/2020 11/21/2019  WBC - 4.6 5.7  Hemoglobin 12.0 - 16.0 12.8 11.6(L)  Hematocrit 36 - 46 39 36.1  Platelets 150 - 399 138(A) 182.0     Ref. Range 04/29/2020 14:52  Iron Latest Ref Range: 28 - 170 ug/dL 72  UIBC Latest Units: ug/dL 404  TIBC Latest Ref Range: 250 - 450 ug/dL 476 (H)  Saturation Ratios Latest Ref Range: 10.4 - 31.8 % 15  Ferritin Latest Ref Range: 11 - 307 ng/mL 17     Ref. Range 04/29/2020 00:00  WBC Unknown 4.6  RBC Latest Ref Range: 3.87 - 5.11  4.42  Hemoglobin Latest Ref Range: 12.0 - 16.0  12.8  HCT Latest Ref Range: 36 - 46  39  Platelets Latest Ref Range: 150 - 399  138 (A)    Ref. Range 04/29/2020 14:52  CEA Latest Ref Range: 0.0 - 4.7 ng/mL 12.5 (H)   ASSESSMENT & PLAN:   Assessment/Plan:  A 54  y.o. female with stage IIIC (T4b N1 M0) colon cancer, status post a right hemicolectomy in early October 2018, followed by 12 cycles of adjuvant chemotherapy, which were completed in April 2019.  Although her exam was normal, the risk rise in her CEA level since her last visit has me concerned.  Based upon this, I will arrange for her to undergo a PET scan in the forthcoming weeks to rule out occult sites of disease metastasis.  I will see her back the following day to go over her PET scan results and their implications.  Also, of note, despite her normal hemoglobin, her iron parameters are still on the low side.  Based upon this, I will arrange for her to receive IV Feraheme 1020 mg over these next few weeks. The patient understands all the plans discussed today and is in agreement with them.      Lopaka Karge Macarthur Critchley, MD

## 2020-04-29 ENCOUNTER — Other Ambulatory Visit: Payer: Self-pay | Admitting: Oncology

## 2020-04-29 ENCOUNTER — Other Ambulatory Visit: Payer: Self-pay

## 2020-04-29 ENCOUNTER — Inpatient Hospital Stay: Payer: PRIVATE HEALTH INSURANCE | Attending: Oncology | Admitting: Hematology and Oncology

## 2020-04-29 ENCOUNTER — Encounter: Payer: Self-pay | Admitting: Oncology

## 2020-04-29 ENCOUNTER — Inpatient Hospital Stay (INDEPENDENT_AMBULATORY_CARE_PROVIDER_SITE_OTHER): Payer: PRIVATE HEALTH INSURANCE | Admitting: Oncology

## 2020-04-29 VITALS — BP 129/77 | HR 100 | Temp 97.9°F | Resp 16 | Ht 68.0 in | Wt 208.9 lb

## 2020-04-29 DIAGNOSIS — Z9049 Acquired absence of other specified parts of digestive tract: Secondary | ICD-10-CM | POA: Insufficient documentation

## 2020-04-29 DIAGNOSIS — C182 Malignant neoplasm of ascending colon: Secondary | ICD-10-CM | POA: Insufficient documentation

## 2020-04-29 DIAGNOSIS — R110 Nausea: Secondary | ICD-10-CM | POA: Insufficient documentation

## 2020-04-29 DIAGNOSIS — R197 Diarrhea, unspecified: Secondary | ICD-10-CM | POA: Diagnosis not present

## 2020-04-29 LAB — CBC AND DIFFERENTIAL
HCT: 39 (ref 36–46)
Hemoglobin: 12.8 (ref 12.0–16.0)
Neutrophils Absolute: 2.94
Platelets: 138 — AB (ref 150–399)
WBC: 4.6

## 2020-04-29 LAB — IRON AND TIBC
Iron: 72 ug/dL (ref 28–170)
Saturation Ratios: 15 % (ref 10.4–31.8)
TIBC: 476 ug/dL — ABNORMAL HIGH (ref 250–450)
UIBC: 404 ug/dL

## 2020-04-29 LAB — CBC: RBC: 4.42 (ref 3.87–5.11)

## 2020-04-29 LAB — FERRITIN: Ferritin: 17 ng/mL (ref 11–307)

## 2020-04-30 LAB — CEA: CEA: 12.5 ng/mL — ABNORMAL HIGH (ref 0.0–4.7)

## 2020-05-01 ENCOUNTER — Telehealth: Payer: Self-pay | Admitting: Oncology

## 2020-05-01 NOTE — Telephone Encounter (Signed)
Per 11/30 los appts sched-lft vm

## 2020-05-09 ENCOUNTER — Other Ambulatory Visit: Payer: Self-pay | Admitting: Oncology

## 2020-05-09 ENCOUNTER — Telehealth: Payer: Self-pay

## 2020-05-09 DIAGNOSIS — C182 Malignant neoplasm of ascending colon: Secondary | ICD-10-CM

## 2020-05-09 DIAGNOSIS — D509 Iron deficiency anemia, unspecified: Secondary | ICD-10-CM

## 2020-05-09 NOTE — Telephone Encounter (Addendum)
I spoke with Dr Bobby Rumpf, who reviewed pt's labs. He states her iron levels are rock bottom and she needs IV iron. I called pt back and gave her Dr Bobby Rumpf response. She is agreeable to iron infusion. Pt then asked me if her CEA level was back. I placed pt's call on hold, & called Dr Bobby Rumpf back. Her CEA level has risen since her last lab draw. Dr Bobby Rumpf states he will call pt back in a bit. Pt notified that Dr Bobby Rumpf will call her back .   Pt called to request lab results from office visit on 04/24/2020. Dr Bobby Rumpf is in pt room @ present. I told pt, I would call her back. [phone]

## 2020-05-10 ENCOUNTER — Telehealth: Payer: Self-pay | Admitting: Psychiatry

## 2020-05-10 NOTE — Telephone Encounter (Signed)
Pt lm inquiring if medication her PCP prescribed will interact with medication Dr CC has prescribed. Would like a return call at # [phone].

## 2020-05-13 DIAGNOSIS — D509 Iron deficiency anemia, unspecified: Secondary | ICD-10-CM | POA: Insufficient documentation

## 2020-05-13 NOTE — Telephone Encounter (Signed)
I sent scheduling request for feraheme infusions to the schedulers today.

## 2020-05-15 MED FILL — Ferumoxytol Inj 510 MG/17ML (30 MG/ML) (Elemental Fe): INTRAVENOUS | Qty: 17 | Status: AC

## 2020-05-16 ENCOUNTER — Other Ambulatory Visit: Payer: Self-pay

## 2020-05-16 ENCOUNTER — Other Ambulatory Visit: Payer: Self-pay | Admitting: Hematology and Oncology

## 2020-05-16 ENCOUNTER — Inpatient Hospital Stay: Payer: PRIVATE HEALTH INSURANCE | Attending: Oncology

## 2020-05-16 ENCOUNTER — Inpatient Hospital Stay: Payer: PRIVATE HEALTH INSURANCE

## 2020-05-16 VITALS — BP 133/87 | HR 86 | Temp 97.4°F | Resp 18 | Ht 68.0 in

## 2020-05-16 DIAGNOSIS — R519 Headache, unspecified: Secondary | ICD-10-CM

## 2020-05-16 DIAGNOSIS — R112 Nausea with vomiting, unspecified: Secondary | ICD-10-CM | POA: Diagnosis not present

## 2020-05-16 DIAGNOSIS — R42 Dizziness and giddiness: Secondary | ICD-10-CM | POA: Diagnosis not present

## 2020-05-16 DIAGNOSIS — G8929 Other chronic pain: Secondary | ICD-10-CM

## 2020-05-16 DIAGNOSIS — D508 Other iron deficiency anemias: Secondary | ICD-10-CM | POA: Diagnosis not present

## 2020-05-16 DIAGNOSIS — Z85038 Personal history of other malignant neoplasm of large intestine: Secondary | ICD-10-CM | POA: Insufficient documentation

## 2020-05-16 DIAGNOSIS — H532 Diplopia: Secondary | ICD-10-CM

## 2020-05-16 DIAGNOSIS — D509 Iron deficiency anemia, unspecified: Secondary | ICD-10-CM

## 2020-05-16 LAB — BASIC METABOLIC PANEL
BUN: 53 — AB (ref 4–21)
CO2: 31 — AB (ref 13–22)
Chloride: 92 — AB (ref 99–108)
Creatinine: 1.6 — AB (ref 0.5–1.1)
Glucose: 183
Potassium: 3.1 — AB (ref 3.4–5.3)
Sodium: 135 — AB (ref 137–147)

## 2020-05-16 LAB — CBC AND DIFFERENTIAL
HCT: 46 (ref 36–46)
Hemoglobin: 15.6 (ref 12.0–16.0)
Neutrophils Absolute: 4.54
Platelets: 181 (ref 150–399)
WBC: 6.4

## 2020-05-16 LAB — HEPATIC FUNCTION PANEL
ALT: 17 (ref 7–35)
AST: 22 (ref 13–35)
Alkaline Phosphatase: 174 — AB (ref 25–125)
Bilirubin, Total: 0.7

## 2020-05-16 LAB — COMPREHENSIVE METABOLIC PANEL
Albumin: 4.2 (ref 3.5–5.0)
Calcium: 9.9 (ref 8.7–10.7)

## 2020-05-16 LAB — CBC
MCV: 87 (ref 81–99)
RBC: 5.32 — AB (ref 3.87–5.11)

## 2020-05-16 MED ORDER — ONDANSETRON HCL 4 MG/2ML IJ SOLN
INTRAMUSCULAR | Status: AC
  Filled 2020-05-16: qty 2

## 2020-05-16 MED ORDER — HEPARIN SOD (PORK) LOCK FLUSH 100 UNIT/ML IV SOLN
500.0000 [IU] | Freq: Once | INTRAVENOUS | Status: AC | PRN
Start: 2020-05-16 — End: 2020-05-16
  Administered 2020-05-16: 500 [IU]
  Filled 2020-05-16: qty 5

## 2020-05-16 MED ORDER — SODIUM CHLORIDE 0.9 % IV SOLN
Freq: Once | INTRAVENOUS | Status: AC
  Filled 2020-05-16: qty 250

## 2020-05-16 MED ORDER — ONDANSETRON HCL 4 MG/2ML IJ SOLN
4.0000 mg | Freq: Once | INTRAMUSCULAR | Status: AC
  Administered 2020-05-16: 4 mg via INTRAVENOUS

## 2020-05-16 MED ORDER — SODIUM CHLORIDE 0.9 % IV SOLN
INTRAVENOUS | Status: DC
  Filled 2020-05-16 (×2): qty 250

## 2020-05-16 NOTE — Progress Notes (Unsigned)
S: The patient was seen today in the infusion center while in here for [facility] as she reported intractable nausea and vomiting for 3 weeks, as well as dizziness.  She has seen her primary care physician who felt she likely had vertigo.  She was prescribed meclizine and Phenergan, which were not very effective.  She was also given Valium, which was too sedating.  She feels so bad, if she did not have this appointment here today, she would have gone to the emergency room.  She also reports occipital headaches and blurry and double vision in the same timeframe.  She does not feel her dizziness is positional.  She denies paresthesias or focal weakness.  Of note, her CEA did increase to over 12 and she is scheduled for a PET scan on 12/21. O: Pulse is orthostatic. Mucous membranes are dry.  Pupils are equal reactive to light and accommodation.  EOMs are intact. A/P: Shirlean Kelly was held and the patient was given IV Zofran and normal saline and labs sent for CBC and comprehensive metabolic panel.  Her symptoms improved with the IV Zofran and normal saline, however, she still had lightheadedness with standing after 1 L, so she was given a second liter of normal saline.  The labs finally returned with a creatinine 1.6, baseline 1, BUN 53, potassium 3.1 and glucose 183 BC was normal.  We will arrange for her to receive IV potassium additional IV fluids tomorrow.  Due to the chronic nausea vomiting headaches dizziness and vision changes, I will arrange for her to have a CT head as soon as possible.  I do not feel she could tolerate the MRI with his severe nausea and vomiting.  I advised her to alternate either meclizine or Phenergan with Zofran and try to drink plenty of liquids in the meantime.  She will also see Dr. Bobby Rumpf on December 22nd for the results of her PET scan.

## 2020-05-16 NOTE — Patient Instructions (Signed)
Dehydration, Adult Dehydration is a condition in which there is not enough water or other fluids in the body. This happens when a person loses more fluids than he or she takes in. Important organs, such as the kidneys, brain, and heart, cannot function without a proper amount of fluids. Any loss of fluids from the body can lead to dehydration. Dehydration can be mild, moderate, or severe. It should be treated right away to prevent it from becoming severe. What are the causes? Dehydration may be caused by:  Conditions that cause loss of water or other fluids, such as diarrhea, vomiting, or sweating or urinating a lot.  Not drinking enough fluids, especially when you are ill or doing activities that require a lot of energy.  Other illnesses and conditions, such as fever or infection.  Certain medicines, such as medicines that remove excess fluid from the body (diuretics).  Lack of safe drinking water.  Not being able to get enough water and food. What increases the risk? The following factors may make you more likely to develop this condition:  Having a long-term (chronic) illness that has not been treated properly, such as diabetes, heart disease, or kidney disease.  Being 65 years of age or older.  Having a disability.  Living in a place that is high in altitude, where thinner, drier air causes more fluid loss.  Doing exercises that put stress on your body for a long time (endurance sports). What are the signs or symptoms? Symptoms of dehydration depend on how severe it is. Mild or moderate dehydration  Thirst.  Dry lips or dry mouth.  Dizziness or light-headedness, especially when standing up from a seated position.  Muscle cramps.  Dark urine. Urine may be the color of tea.  Less urine or tears produced than usual.  Headache. Severe dehydration  Changes in skin. Your skin may be cold and clammy, blotchy, or pale. Your skin also may not return to normal after being  lightly pinched and released.  Little or no tears, urine, or sweat.  Changes in vital signs, such as rapid breathing and low blood pressure. Your pulse may be weak or may be faster than 100 beats a minute when you are sitting still.  Other changes, such as: ? Feeling very thirsty. ? Sunken eyes. ? Cold hands and feet. ? Confusion. ? Being very tired (lethargic) or having trouble waking from sleep. ? Short-term weight loss. ? Loss of consciousness. How is this diagnosed? This condition is diagnosed based on your symptoms and a physical exam. You may have blood and urine tests to help confirm the diagnosis. How is this treated? Treatment for this condition depends on how severe it is. Treatment should be started right away. Do not wait until dehydration becomes severe. Severe dehydration is an emergency and needs to be treated in a hospital.  Mild or moderate dehydration can be treated at home. You may be asked to: ? Drink more fluids. ? Drink an oral rehydration solution (ORS). This drink helps restore proper amounts of fluids and salts and minerals in the blood (electrolytes).  Severe dehydration can be treated: ? With IV fluids. ? By correcting abnormal levels of electrolytes. This is often done by giving electrolytes through a tube that is passed through your nose and into your stomach (nasogastric tube, or NG tube). ? By treating the underlying cause of dehydration. Follow these instructions at home: Oral rehydration solution If told by your health care provider, drink an ORS:  Make   an ORS by following instructions on the package.  Start by drinking small amounts, about  cup (120 mL) every 5-10 minutes.  Slowly increase how much you drink until you have taken the amount recommended by your health care provider. Eating and drinking         Drink enough clear fluid to keep your urine pale yellow. If you were told to drink an ORS, finish the ORS first and then start slowly  drinking other clear fluids. Drink fluids such as: ? Water. Do not drink only water. Doing that can lead to hyponatremia, which is having too little salt (sodium) in the body. ? Water from ice chips you suck on. ? Fruit juice that you have added water to (diluted fruit juice). ? Low-calorie sports drinks.  Eat foods that contain a healthy balance of electrolytes, such as bananas, oranges, potatoes, tomatoes, and spinach.  Do not drink alcohol.  Avoid the following: ? Drinks that contain a lot of sugar. These include high-calorie sports drinks, fruit juice that is not diluted, and soda. ? Caffeine. ? Foods that are greasy or contain a lot of fat or sugar. General instructions  Take over-the-counter and prescription medicines only as told by your health care provider.  Do not take sodium tablets. Doing that can lead to having too much sodium in the body (hypernatremia).  Return to your normal activities as told by your health care provider. Ask your health care provider what activities are safe for you.  Keep all follow-up visits as told by your health care provider. This is important. Contact a health care provider if:  You have muscle cramps, pain, or discomfort, such as: ? Pain in your abdomen and the pain gets worse or stays in one area (localizes). ? Stiff neck.  You have a rash.  You are more irritable than usual.  You are sleepier or have a harder time waking than usual.  You feel weak or dizzy.  You feel very thirsty. Get help right away if you have:  Any symptoms of severe dehydration.  Symptoms of vomiting, such as: ? You cannot eat or drink without vomiting. ? Vomiting gets worse or does not go away. ? Vomit includes blood or green matter (bile).  Symptoms that get worse with treatment.  A fever.  A severe headache.  Problems with urination or bowel movements, such as: ? Diarrhea that gets worse or does not go away. ? Blood in your stool (feces). This  may cause stool to look black and tarry. ? Not urinating, or urinating only a small amount of very dark urine, within 6-8 hours.  Trouble breathing. These symptoms may represent a serious problem that is an emergency. Do not wait to see if the symptoms will go away. Get medical help right away. Call your local emergency services (911 in the [place] Do not drive yourself to the hospital. Summary  Dehydration is a condition in which there is not enough water or other fluids in the body. This happens when a person loses more fluids than he or she takes in.  Treatment for this condition depends on how severe it is. Treatment should be started right away. Do not wait until dehydration becomes severe.  Drink enough clear fluid to keep your urine pale yellow. If you were told to drink an oral rehydration solution (ORS), finish the ORS first and then start slowly drinking other clear fluids.  Take over-the-counter and prescription medicines only as told by your health care   provider.  Get help right away if you have any symptoms of severe dehydration. This information is not intended to replace advice given to you by your health care provider. Make sure you discuss any questions you have with your health care provider. Document Revised: 12/29/2018 Document Reviewed: 12/29/2018 Elsevier Patient Education  2020 [employer]   

## 2020-05-17 ENCOUNTER — Inpatient Hospital Stay: Payer: PRIVATE HEALTH INSURANCE

## 2020-05-17 ENCOUNTER — Other Ambulatory Visit: Payer: Self-pay | Admitting: Hematology and Oncology

## 2020-05-17 VITALS — BP 114/84 | HR 101 | Temp 97.6°F | Resp 18 | Ht 68.0 in | Wt 194.0 lb

## 2020-05-17 DIAGNOSIS — D508 Other iron deficiency anemias: Secondary | ICD-10-CM | POA: Diagnosis not present

## 2020-05-17 DIAGNOSIS — D509 Iron deficiency anemia, unspecified: Secondary | ICD-10-CM

## 2020-05-17 MED ORDER — EPINEPHRINE 0.3 MG/0.3ML IJ SOAJ
0.3000 mg | Freq: Once | INTRAMUSCULAR | Status: DC | PRN
  Filled 2020-05-17: qty 0.6

## 2020-05-17 MED ORDER — SODIUM CHLORIDE 0.9% FLUSH
10.0000 mL | Freq: Once | INTRAVENOUS | Status: DC | PRN
  Filled 2020-05-17: qty 10

## 2020-05-17 MED ORDER — SODIUM CHLORIDE 0.9 % IV SOLN
510.0000 mg | Freq: Once | INTRAVENOUS | Status: AC
  Administered 2020-05-17: 510 mg via INTRAVENOUS
  Filled 2020-05-17: qty 17

## 2020-05-17 MED ORDER — METHYLPREDNISOLONE SODIUM SUCC 125 MG IJ SOLR: 125.0000 mg | Freq: Once | INTRAMUSCULAR | Status: DC | PRN

## 2020-05-17 MED ORDER — SODIUM CHLORIDE 0.9 % IV SOLN
Freq: Once | INTRAVENOUS | Status: AC
  Filled 2020-05-17: qty 250

## 2020-05-17 MED ORDER — ALBUTEROL SULFATE (2.5 MG/3ML) 0.083% IN NEBU
2.5000 mg | INHALATION_SOLUTION | Freq: Once | RESPIRATORY_TRACT | Status: DC | PRN
  Filled 2020-05-17: qty 3

## 2020-05-17 MED ORDER — LORAZEPAM 2 MG/ML IJ SOLN
1.0000 mg | Freq: Once | INTRAMUSCULAR | Status: AC
  Administered 2020-05-17: 1 mg via INTRAVENOUS

## 2020-05-17 MED ORDER — POTASSIUM CHLORIDE 10 MEQ/100ML IV SOLN
INTRAVENOUS | Status: AC
  Filled 2020-05-17: qty 200

## 2020-05-17 MED ORDER — FAMOTIDINE IN NACL 20-0.9 MG/50ML-% IV SOLN: 20.0000 mg | Freq: Once | INTRAVENOUS | Status: DC | PRN

## 2020-05-17 MED ORDER — HEPARIN SOD (PORK) LOCK FLUSH 100 UNIT/ML IV SOLN
250.0000 [IU] | Freq: Once | INTRAVENOUS | Status: DC | PRN
  Filled 2020-05-17: qty 5

## 2020-05-17 MED ORDER — POTASSIUM CHLORIDE 10 MEQ/100ML IV SOLN
10.0000 meq | INTRAVENOUS | Status: AC
  Administered 2020-05-17 (×2): 10 meq via INTRAVENOUS

## 2020-05-17 MED ORDER — ALTEPLASE 2 MG IJ SOLR
2.0000 mg | Freq: Once | INTRAMUSCULAR | Status: DC | PRN
  Filled 2020-05-17: qty 2

## 2020-05-17 MED ORDER — HEPARIN SOD (PORK) LOCK FLUSH 100 UNIT/ML IV SOLN
500.0000 [IU] | Freq: Once | INTRAVENOUS | Status: AC | PRN
  Administered 2020-05-17: 500 [IU]
  Filled 2020-05-17: qty 5

## 2020-05-17 MED ORDER — LORAZEPAM 2 MG/ML IJ SOLN
INTRAMUSCULAR | Status: AC
  Filled 2020-05-17: qty 1

## 2020-05-17 MED ORDER — SODIUM CHLORIDE 0.9 % IV SOLN
INTRAVENOUS | Status: DC
  Filled 2020-05-17 (×2): qty 250

## 2020-05-17 MED ORDER — DIPHENHYDRAMINE HCL 50 MG/ML IJ SOLN: 50.0000 mg | Freq: Once | INTRAMUSCULAR | Status: DC | PRN

## 2020-05-17 MED ORDER — SODIUM CHLORIDE 0.9% FLUSH
3.0000 mL | Freq: Once | INTRAVENOUS | Status: DC | PRN
  Filled 2020-05-17: qty 10

## 2020-05-17 MED ORDER — SODIUM CHLORIDE 0.9 % IV SOLN
Freq: Once | INTRAVENOUS | Status: DC | PRN
  Filled 2020-05-17: qty 250

## 2020-05-17 NOTE — Progress Notes (Signed)
Pt states that she had two episodes of vomiting since leaving facility yesterday.  Once was last pm, the other was on ride over to [facility] today.  Pt correlates Nausea with movements.

## 2020-05-17 NOTE — Patient Instructions (Signed)
Hypokalemia Hypokalemia means that the amount of potassium in the blood is lower than normal. Potassium is a chemical (electrolyte) that helps regulate the amount of fluid in the body. It also stimulates muscle tightening (contraction) and helps nerves work properly. Normally, most of the body's potassium is inside cells, and only a very small amount is in the blood. Because the amount in the blood is so small, minor changes to potassium levels in the blood can be life-threatening. What are the causes? This condition may be caused by:  Antibiotic medicine.  Diarrhea or vomiting. Taking too much of a medicine that helps you have a bowel movement (laxative) can cause diarrhea and lead to hypokalemia.  Chronic kidney disease (CKD).  Medicines that help the body get rid of excess fluid (diuretics).  Eating disorders, such as bulimia.  Low magnesium levels in the body.  Sweating a lot. What are the signs or symptoms? Symptoms of this condition include:  Weakness.  Constipation.  Fatigue.  Muscle cramps.  Mental confusion.  Skipped heartbeats or irregular heartbeat (palpitations).  Tingling or numbness. How is this diagnosed? This condition is diagnosed with a blood test. How is this treated? This condition may be treated by:  Taking potassium supplements by mouth.  Adjusting the medicines that you take.  Eating more foods that contain a lot of potassium. If your potassium level is very low, you may need to get potassium through an IV and be monitored in the hospital. Follow these instructions at home:   Take over-the-counter and prescription medicines only as told by your health care provider. This includes vitamins and supplements.  Eat a healthy diet. A healthy diet includes fresh fruits and vegetables, whole grains, healthy fats, and lean proteins.  If instructed, eat more foods that contain a lot of potassium. This includes: ? Nuts, such as peanuts and  pistachios. ? Seeds, such as sunflower seeds and pumpkin seeds. ? Peas, lentils, and lima beans. ? Whole grain and bran cereals and breads. ? Fresh fruits and vegetables, such as apricots, avocado, bananas, cantaloupe, kiwi, oranges, tomatoes, asparagus, and potatoes. ? Orange juice. ? Tomato juice. ? Red meats. ? Yogurt.  Keep all follow-up visits as told by your health care provider. This is important. Contact a health care provider if you:  Have weakness that gets worse.  Feel your heart pounding or racing.  Vomit.  Have diarrhea.  Have diabetes (diabetes mellitus) and you have trouble keeping your blood sugar (glucose) in your target range. Get help right away if you:  Have chest pain.  Have shortness of breath.  Have vomiting or diarrhea that lasts for more than 2 days.  Faint. Summary  Hypokalemia means that the amount of potassium in the blood is lower than normal.  This condition is diagnosed with a blood test.  Hypokalemia may be treated by taking potassium supplements, adjusting the medicines that you take, or eating more foods that are high in potassium.  If your potassium level is very low, you may need to get potassium through an IV and be monitored in the hospital. This information is not intended to replace advice given to you by your health care provider. Make sure you discuss any questions you have with your health care provider. Document Revised: 12/29/2017 Document Reviewed: 12/29/2017 Elsevier Patient Education  [place]. Dehydration, Adult Dehydration is a condition in which there is not enough water or other fluids in the body. This happens when a person loses more fluids than  he or she takes in. Important organs, such as the kidneys, brain, and heart, cannot function without a proper amount of fluids. Any loss of fluids from the body can lead to dehydration. Dehydration can be mild, moderate, or severe. It should be treated right away to  prevent it from becoming severe. What are the causes? Dehydration may be caused by:  Conditions that cause loss of water or other fluids, such as diarrhea, vomiting, or sweating or urinating a lot.  Not drinking enough fluids, especially when you are ill or doing activities that require a lot of energy.  Other illnesses and conditions, such as fever or infection.  Certain medicines, such as medicines that remove excess fluid from the body (diuretics).  Lack of safe drinking water.  Not being able to get enough water and food. What increases the risk? The following factors may make you more likely to develop this condition:  Having a long-term (chronic) illness that has not been treated properly, such as diabetes, heart disease, or kidney disease.  Being 7 years of age or older.  Having a disability.  Living in a place that is high in altitude, where thinner, drier air causes more fluid loss.  Doing exercises that put stress on your body for a long time (endurance sports). What are the signs or symptoms? Symptoms of dehydration depend on how severe it is. Mild or moderate dehydration  Thirst.  Dry lips or dry mouth.  Dizziness or light-headedness, especially when standing up from a seated position.  Muscle cramps.  Dark urine. Urine may be the color of tea.  Less urine or tears produced than usual.  Headache. Severe dehydration  Changes in skin. Your skin may be cold and clammy, blotchy, or pale. Your skin also may not return to normal after being lightly pinched and released.  Little or no tears, urine, or sweat.  Changes in vital signs, such as rapid breathing and low blood pressure. Your pulse may be weak or may be faster than 100 beats a minute when you are sitting still.  Other changes, such as: ? Feeling very thirsty. ? Sunken eyes. ? Cold hands and feet. ? Confusion. ? Being very tired (lethargic) or having trouble waking from sleep. ? Short-term  weight loss. ? Loss of consciousness. How is this diagnosed? This condition is diagnosed based on your symptoms and a physical exam. You may have blood and urine tests to help confirm the diagnosis. How is this treated? Treatment for this condition depends on how severe it is. Treatment should be started right away. Do not wait until dehydration becomes severe. Severe dehydration is an emergency and needs to be treated in a hospital.  Mild or moderate dehydration can be treated at home. You may be asked to: ? Drink more fluids. ? Drink an oral rehydration solution (ORS). This drink helps restore proper amounts of fluids and salts and minerals in the blood (electrolytes).  Severe dehydration can be treated: ? With IV fluids. ? By correcting abnormal levels of electrolytes. This is often done by giving electrolytes through a tube that is passed through your nose and into your stomach (nasogastric tube, or NG tube). ? By treating the underlying cause of dehydration. Follow these instructions at home: Oral rehydration solution If told by your health care provider, drink an ORS:  Make an ORS by following instructions on the package.  Start by drinking small amounts, about  cup (120 mL) every 5-10 minutes.  Slowly increase how much  you drink until you have taken the amount recommended by your health care provider. Eating and drinking         Drink enough clear fluid to keep your urine pale yellow. If you were told to drink an ORS, finish the ORS first and then start slowly drinking other clear fluids. Drink fluids such as: ? Water. Do not drink only water. Doing that can lead to hyponatremia, which is having too little salt (sodium) in the body. ? Water from ice chips you suck on. ? Fruit juice that you have added water to (diluted fruit juice). ? Low-calorie sports drinks.  Eat foods that contain a healthy balance of electrolytes, such as bananas, oranges, potatoes, tomatoes, and  spinach.  Do not drink alcohol.  Avoid the following: ? Drinks that contain a lot of sugar. These include high-calorie sports drinks, fruit juice that is not diluted, and soda. ? Caffeine. ? Foods that are greasy or contain a lot of fat or sugar. General instructions  Take over-the-counter and prescription medicines only as told by your health care provider.  Do not take sodium tablets. Doing that can lead to having too much sodium in the body (hypernatremia).  Return to your normal activities as told by your health care provider. Ask your health care provider what activities are safe for you.  Keep all follow-up visits as told by your health care provider. This is important. Contact a health care provider if:  You have muscle cramps, pain, or discomfort, such as: ? Pain in your abdomen and the pain gets worse or stays in one area (localizes). ? Stiff neck.  You have a rash.  You are more irritable than usual.  You are sleepier or have a harder time waking than usual.  You feel weak or dizzy.  You feel very thirsty. Get help right away if you have:  Any symptoms of severe dehydration.  Symptoms of vomiting, such as: ? You cannot eat or drink without vomiting. ? Vomiting gets worse or does not go away. ? Vomit includes blood or green matter (bile).  Symptoms that get worse with treatment.  A fever.  A severe headache.  Problems with urination or bowel movements, such as: ? Diarrhea that gets worse or does not go away. ? Blood in your stool (feces). This may cause stool to look black and tarry. ? Not urinating, or urinating only a small amount of very dark urine, within 6-8 hours.  Trouble breathing. These symptoms may represent a serious problem that is an emergency. Do not wait to see if the symptoms will go away. Get medical help right away. Call your local emergency services (911 in the [place] Do not drive yourself to the hospital. Summary  Dehydration is a  condition in which there is not enough water or other fluids in the body. This happens when a person loses more fluids than he or she takes in.  Treatment for this condition depends on how severe it is. Treatment should be started right away. Do not wait until dehydration becomes severe.  Drink enough clear fluid to keep your urine pale yellow. If you were told to drink an oral rehydration solution (ORS), finish the ORS first and then start slowly drinking other clear fluids.  Take over-the-counter and prescription medicines only as told by your health care provider.  Get help right away if you have any symptoms of severe dehydration. This information is not intended to replace advice given to you by your  health care provider. Make sure you discuss any questions you have with your health care provider. Document Revised: 12/29/2018 Document Reviewed: 12/29/2018 Elsevier Patient Education  [place].

## 2020-05-21 ENCOUNTER — Telehealth: Payer: Self-pay | Admitting: Oncology

## 2020-05-21 ENCOUNTER — Inpatient Hospital Stay: Payer: PRIVATE HEALTH INSURANCE | Admitting: Oncology

## 2020-05-21 NOTE — Telephone Encounter (Signed)
12/21 lft vm about ct scan on 12/27@1030am ,PET Scan on 1/3/2221030 am and dv on 1/4@930am .

## 2020-05-23 ENCOUNTER — Other Ambulatory Visit: Payer: Self-pay | Admitting: Hematology and Oncology

## 2020-05-23 ENCOUNTER — Other Ambulatory Visit: Payer: Self-pay

## 2020-05-23 ENCOUNTER — Inpatient Hospital Stay: Payer: PRIVATE HEALTH INSURANCE

## 2020-05-23 ENCOUNTER — Encounter: Payer: Self-pay | Admitting: Oncology

## 2020-05-23 VITALS — BP 125/84 | HR 92 | Temp 97.8°F | Resp 18 | Ht 68.0 in | Wt 191.5 lb

## 2020-05-23 DIAGNOSIS — D509 Iron deficiency anemia, unspecified: Secondary | ICD-10-CM

## 2020-05-23 DIAGNOSIS — D508 Other iron deficiency anemias: Secondary | ICD-10-CM | POA: Diagnosis not present

## 2020-05-23 MED ORDER — SODIUM CHLORIDE 0.9 % IV SOLN
INTRAVENOUS | Status: DC
  Filled 2020-05-23 (×2): qty 250

## 2020-05-23 MED ORDER — SODIUM CHLORIDE 0.9 % IV SOLN
510.0000 mg | Freq: Once | INTRAVENOUS | Status: AC
  Administered 2020-05-23: 510 mg via INTRAVENOUS
  Filled 2020-05-23: qty 17

## 2020-05-23 MED ORDER — LORAZEPAM 2 MG/ML IJ SOLN
INTRAMUSCULAR | Status: AC
  Filled 2020-05-23: qty 1

## 2020-05-23 MED ORDER — LORAZEPAM 2 MG/ML IJ SOLN
1.0000 mg | Freq: Once | INTRAMUSCULAR | Status: AC
  Administered 2020-05-23: 1 mg via INTRAVENOUS

## 2020-05-23 MED ORDER — SODIUM CHLORIDE 0.9 % IV SOLN
INTRAVENOUS | Status: DC
  Filled 2020-05-23: qty 250

## 2020-05-23 MED ORDER — SODIUM CHLORIDE 0.9 % IV SOLN
Freq: Once | INTRAVENOUS | Status: DC
  Filled 2020-05-23: qty 250

## 2020-05-23 MED ORDER — HEPARIN SOD (PORK) LOCK FLUSH 100 UNIT/ML IV SOLN
500.0000 [IU] | Freq: Once | INTRAVENOUS | Status: AC | PRN
  Administered 2020-05-23: 500 [IU]
  Filled 2020-05-23: qty 5

## 2020-05-23 NOTE — Patient Instructions (Signed)
Dehydration, Adult Dehydration is a condition in which there is not enough water or other fluids in the body. This happens when a person loses more fluids than he or she takes in. Important organs, such as the kidneys, brain, and heart, cannot function without a proper amount of fluids. Any loss of fluids from the body can lead to dehydration. Dehydration can be mild, moderate, or severe. It should be treated right away to prevent it from becoming severe. What are the causes? Dehydration may be caused by:  Conditions that cause loss of water or other fluids, such as diarrhea, vomiting, or sweating or urinating a lot.  Not drinking enough fluids, especially when you are ill or doing activities that require a lot of energy.  Other illnesses and conditions, such as fever or infection.  Certain medicines, such as medicines that remove excess fluid from the body (diuretics).  Lack of safe drinking water.  Not being able to get enough water and food. What increases the risk? The following factors may make you more likely to develop this condition:  Having a long-term (chronic) illness that has not been treated properly, such as diabetes, heart disease, or kidney disease.  Being 65 years of age or older.  Having a disability.  Living in a place that is high in altitude, where thinner, drier air causes more fluid loss.  Doing exercises that put stress on your body for a long time (endurance sports). What are the signs or symptoms? Symptoms of dehydration depend on how severe it is. Mild or moderate dehydration  Thirst.  Dry lips or dry mouth.  Dizziness or light-headedness, especially when standing up from a seated position.  Muscle cramps.  Dark urine. Urine may be the color of tea.  Less urine or tears produced than usual.  Headache. Severe dehydration  Changes in skin. Your skin may be cold and clammy, blotchy, or pale. Your skin also may not return to normal after being  lightly pinched and released.  Little or no tears, urine, or sweat.  Changes in vital signs, such as rapid breathing and low blood pressure. Your pulse may be weak or may be faster than 100 beats a minute when you are sitting still.  Other changes, such as: ? Feeling very thirsty. ? Sunken eyes. ? Cold hands and feet. ? Confusion. ? Being very tired (lethargic) or having trouble waking from sleep. ? Short-term weight loss. ? Loss of consciousness. How is this diagnosed? This condition is diagnosed based on your symptoms and a physical exam. You may have blood and urine tests to help confirm the diagnosis. How is this treated? Treatment for this condition depends on how severe it is. Treatment should be started right away. Do not wait until dehydration becomes severe. Severe dehydration is an emergency and needs to be treated in a hospital.  Mild or moderate dehydration can be treated at home. You may be asked to: ? Drink more fluids. ? Drink an oral rehydration solution (ORS). This drink helps restore proper amounts of fluids and salts and minerals in the blood (electrolytes).  Severe dehydration can be treated: ? With IV fluids. ? By correcting abnormal levels of electrolytes. This is often done by giving electrolytes through a tube that is passed through your nose and into your stomach (nasogastric tube, or NG tube). ? By treating the underlying cause of dehydration. Follow these instructions at home: Oral rehydration solution If told by your health care provider, drink an ORS:  Make   an ORS by following instructions on the package.  Start by drinking small amounts, about  cup (120 mL) every 5-10 minutes.  Slowly increase how much you drink until you have taken the amount recommended by your health care provider. Eating and drinking         Drink enough clear fluid to keep your urine pale yellow. If you were told to drink an ORS, finish the ORS first and then start slowly  drinking other clear fluids. Drink fluids such as: ? Water. Do not drink only water. Doing that can lead to hyponatremia, which is having too little salt (sodium) in the body. ? Water from ice chips you suck on. ? Fruit juice that you have added water to (diluted fruit juice). ? Low-calorie sports drinks.  Eat foods that contain a healthy balance of electrolytes, such as bananas, oranges, potatoes, tomatoes, and spinach.  Do not drink alcohol.  Avoid the following: ? Drinks that contain a lot of sugar. These include high-calorie sports drinks, fruit juice that is not diluted, and soda. ? Caffeine. ? Foods that are greasy or contain a lot of fat or sugar. General instructions  Take over-the-counter and prescription medicines only as told by your health care provider.  Do not take sodium tablets. Doing that can lead to having too much sodium in the body (hypernatremia).  Return to your normal activities as told by your health care provider. Ask your health care provider what activities are safe for you.  Keep all follow-up visits as told by your health care provider. This is important. Contact a health care provider if:  You have muscle cramps, pain, or discomfort, such as: ? Pain in your abdomen and the pain gets worse or stays in one area (localizes). ? Stiff neck.  You have a rash.  You are more irritable than usual.  You are sleepier or have a harder time waking than usual.  You feel weak or dizzy.  You feel very thirsty. Get help right away if you have:  Any symptoms of severe dehydration.  Symptoms of vomiting, such as: ? You cannot eat or drink without vomiting. ? Vomiting gets worse or does not go away. ? Vomit includes blood or green matter (bile).  Symptoms that get worse with treatment.  A fever.  A severe headache.  Problems with urination or bowel movements, such as: ? Diarrhea that gets worse or does not go away. ? Blood in your stool (feces). This  may cause stool to look black and tarry. ? Not urinating, or urinating only a small amount of very dark urine, within 6-8 hours.  Trouble breathing. These symptoms may represent a serious problem that is an emergency. Do not wait to see if the symptoms will go away. Get medical help right away. Call your local emergency services (911 in the [place] Do not drive yourself to the hospital. Summary  Dehydration is a condition in which there is not enough water or other fluids in the body. This happens when a person loses more fluids than he or she takes in.  Treatment for this condition depends on how severe it is. Treatment should be started right away. Do not wait until dehydration becomes severe.  Drink enough clear fluid to keep your urine pale yellow. If you were told to drink an oral rehydration solution (ORS), finish the ORS first and then start slowly drinking other clear fluids.  Take over-the-counter and prescription medicines only as told by your health care   provider.  Get help right away if you have any symptoms of severe dehydration. This information is not intended to replace advice given to you by your health care provider. Make sure you discuss any questions you have with your health care provider. Document Revised: 12/29/2018 Document Reviewed: 12/29/2018 Elsevier Patient Education  [place]. Ferumoxytol injection What is this medicine? FERUMOXYTOL is an iron complex. Iron is used to make healthy red blood cells, which carry oxygen and nutrients throughout the body. This medicine is used to treat iron deficiency anemia. This medicine may be used for other purposes; ask your health care provider or pharmacist if you have questions. COMMON BRAND NAME(S): Feraheme What should I tell my health care provider before I take this medicine? They need to know if you have any of these conditions:  anemia not caused by low iron levels  high levels of iron in the  blood  magnetic resonance imaging (MRI) test scheduled  an unusual or allergic reaction to iron, other medicines, foods, dyes, or preservatives  pregnant or trying to get pregnant  breast-feeding How should I use this medicine? This medicine is for injection into a vein. It is given by a health care professional in a hospital or clinic setting. Talk to your pediatrician regarding the use of this medicine in children. Special care may be needed. Overdosage: If you think you have taken too much of this medicine contact a poison control center or emergency room at once. NOTE: This medicine is only for you. Do not share this medicine with others. What if I miss a dose? It is important not to miss your dose. Call your doctor or health care professional if you are unable to keep an appointment. What may interact with this medicine? This medicine may interact with the following medications:  other iron products This list may not describe all possible interactions. Give your health care provider a list of all the medicines, herbs, non-prescription drugs, or dietary supplements you use. Also tell them if you smoke, drink alcohol, or use illegal drugs. Some items may interact with your medicine. What should I watch for while using this medicine? Visit your doctor or healthcare professional regularly. Tell your doctor or healthcare professional if your symptoms do not start to get better or if they get worse. You may need blood work done while you are taking this medicine. You may need to follow a special diet. Talk to your doctor. Foods that contain iron include: whole grains/cereals, dried fruits, beans, or peas, leafy green vegetables, and organ meats (liver, kidney). What side effects may I notice from receiving this medicine? Side effects that you should report to your doctor or health care professional as soon as possible:  allergic reactions like skin rash, itching or hives, swelling of the  face, lips, or tongue  breathing problems  changes in blood pressure  feeling faint or lightheaded, falls  fever or chills  flushing, sweating, or hot feelings  swelling of the ankles or feet Side effects that usually do not require medical attention (report to your doctor or health care professional if they continue or are bothersome):  diarrhea  headache  nausea, vomiting  stomach pain This list may not describe all possible side effects. Call your doctor for medical advice about side effects. You may report side effects to FDA at 1-800-FDA-1088. Where should I keep my medicine? This drug is given in a hospital or clinic and will not be stored at home. NOTE: This  sheet is a summary. It may not cover all possible information. If you have questions about this medicine, talk to your doctor, pharmacist, or health care provider.  2020 Elsevier/Gold Standard (2016-07-06 20:21:10)

## 2020-05-27 ENCOUNTER — Inpatient Hospital Stay: Payer: PRIVATE HEALTH INSURANCE

## 2020-05-27 ENCOUNTER — Telehealth: Payer: Self-pay | Admitting: Hematology and Oncology

## 2020-05-27 ENCOUNTER — Other Ambulatory Visit: Payer: Self-pay | Admitting: Hematology and Oncology

## 2020-05-27 ENCOUNTER — Other Ambulatory Visit: Payer: Self-pay

## 2020-05-27 VITALS — BP 126/94 | HR 103 | Temp 97.5°F | Resp 18 | Ht 68.0 in

## 2020-05-27 DIAGNOSIS — G9389 Other specified disorders of brain: Secondary | ICD-10-CM

## 2020-05-27 DIAGNOSIS — D509 Iron deficiency anemia, unspecified: Secondary | ICD-10-CM

## 2020-05-27 DIAGNOSIS — D508 Other iron deficiency anemias: Secondary | ICD-10-CM | POA: Diagnosis not present

## 2020-05-27 MED ORDER — LORAZEPAM 2 MG/ML IJ SOLN
0.5000 mg | Freq: Once | INTRAMUSCULAR | Status: AC
  Administered 2020-05-27: 0.5 mg via INTRAVENOUS

## 2020-05-27 MED ORDER — DEXAMETHASONE 4 MG PO TABS: 4.0000 mg | ORAL_TABLET | Freq: Three times a day (TID) | ORAL | 0 refills | Status: DC

## 2020-05-27 MED ORDER — DEXAMETHASONE SODIUM PHOSPHATE 10 MG/ML IJ SOLN
INTRAMUSCULAR | Status: AC
  Filled 2020-05-27: qty 1

## 2020-05-27 MED ORDER — DEXAMETHASONE SODIUM PHOSPHATE 10 MG/ML IJ SOLN
10.0000 mg | Freq: Once | INTRAMUSCULAR | Status: AC
  Administered 2020-05-27: 10 mg via INTRAVENOUS

## 2020-05-27 MED ORDER — LORAZEPAM 2 MG/ML IJ SOLN
INTRAMUSCULAR | Status: AC
  Filled 2020-05-27: qty 1

## 2020-05-27 NOTE — Patient Instructions (Signed)
Dexamethasone injection What is this medicine? DEXAMETHASONE (dex a METH a sone) is a corticosteroid. It is used to treat inflammation of the skin, joints, lungs, and other organs. Common conditions treated include asthma, allergies, and arthritis. It is also used for other conditions, like blood disorders and diseases of the adrenal glands. This medicine may be used for other purposes; ask your health care provider or pharmacist if you have questions. COMMON BRAND NAME(S): Decadron, DoubleDex, Simplist Dexamethasone, Solurex What should I tell my health care provider before I take this medicine? They need to know if you have any of these conditions:  Cushing's syndrome  diabetes  glaucoma  heart disease  high blood pressure  infection like herpes, measles, tuberculosis, or chickenpox  kidney disease  liver disease  mental illness  myasthenia gravis  osteoporosis  previous heart attack  seizures  stomach or intestine problems  thyroid disease  an unusual or allergic reaction to dexamethasone, corticosteroids, other medicines, lactose, foods, dyes, or preservatives  pregnant or trying to get pregnant  breast-feeding How should I use this medicine? This medicine is for injection into a muscle, joint, lesion, soft tissue, or vein. It is given by a health care professional in a hospital or clinic setting. Talk to your pediatrician regarding the use of this medicine in children. Special care may be needed. Overdosage: If you think you have taken too much of this medicine contact a poison control center or emergency room at once. NOTE: This medicine is only for you. Do not share this medicine with others. What if I miss a dose? This may not apply. If you are having a series of injections over a prolonged period, try not to miss an appointment. Call your doctor or health care professional to reschedule if you are unable to keep an appointment. What may interact with this  medicine? Do not take this medicine with any of the following medications:  live virus vaccines This medicine may also interact with the following medications:  aminoglutethimide  amphotericin B  aspirin and aspirin-like medicines  certain antibiotics like erythromycin, clarithromycin, and troleandomycin  certain antivirals for HIV or hepatitis  certain medicines for seizures like carbamazepine, phenobarbital, phenytoin  certain medicines to treat myasthenia gravis  cholestyramine  cyclosporine  digoxin  diuretics  ephedrine  female hormones, like estrogen or progestins and birth control pills  insulin or other medicines for diabetes  isoniazid  ketoconazole  medicines that relax muscles for surgery  mifepristone  NSAIDs, medicines for pain and inflammation, like ibuprofen or naproxen  rifampin  skin tests for allergies  thalidomide  vaccines  warfarin This list may not describe all possible interactions. Give your health care provider a list of all the medicines, herbs, non-prescription drugs, or dietary supplements you use. Also tell them if you smoke, drink alcohol, or use illegal drugs. Some items may interact with your medicine. What should I watch for while using this medicine? Visit your health care professional for regular checks on your progress. Tell your health care professional if your symptoms do not start to get better or if they get worse. Your condition will be monitored carefully while you are receiving this medicine. Wear a medical ID bracelet or chain. Carry a card that describes your disease and details of your medicine and dosage times. This medicine may increase your risk of getting an infection. Call your health care professional for advice if you get a fever, chills, or sore throat, or other symptoms of a cold or  flu. Do not treat yourself. Try to avoid being around people who are sick. Call your health care professional if you are  around anyone with measles, chickenpox, or if you develop sores or blisters that do not heal properly. If you are going to need surgery or other procedures, tell your doctor or health care professional that you have taken this medicine within the last 12 months. Ask your doctor or health care professional about your diet. You may need to lower the amount of salt you eat. This medicine may increase blood sugar. Ask your healthcare provider if changes in diet or medicines are needed if you have diabetes. What side effects may I notice from receiving this medicine? Side effects that you should report to your doctor or health care professional as soon as possible:  allergic reactions like skin rash, itching or hives, swelling of the face, lips, or tongue  bloody or black, tarry stools  changes in emotions or moods  changes in vision  confusion, excitement, restlessness  depressed mood  eye pain  hallucinations  muscle weakness  severe or sudden stomach or belly pain  signs and symptoms of high blood sugar such as being more thirsty or hungry or having to urinate more than normal. You may also feel very tired or have blurry vision.  signs and symptoms of infection like fever; chills; cough; sore throat; pain or trouble passing urine  swelling of ankles, feet  unusual bruising or bleeding  wounds that do not heal Side effects that usually do not require medical attention (report to your doctor or health care professional if they continue or are bothersome):  increased appetite  increased growth of face or body hair  headache  nausea, vomiting  pain, redness, or irritation at site where injected  skin problems, acne, thin and shiny skin  trouble sleeping  weight gain This list may not describe all possible side effects. Call your doctor for medical advice about side effects. You may report side effects to FDA at 1-800-FDA-1088. Where should I keep my medicine? This  medicine is given in a hospital or clinic and will not be stored at home. NOTE: This sheet is a summary. It may not cover all possible information. If you have questions about this medicine, talk to your doctor, pharmacist, or health care provider.  2020 Elsevier/Gold Standard (2018-11-29 13:51:58) Lorazepam injection What is this medicine? LORAZEPAM (lor A ze pam) is a benzodiazepine. It is used to treat anxiety and certain types of seizures. It is also used to cause sleep before surgery and to block the memory of the procedure. This medicine may be used for other purposes; ask your health care provider or pharmacist if you have questions. COMMON BRAND NAME(S): Ativan What should I tell my health care provider before I take this medicine? They need to know if you have any of these conditions:  glaucoma  history of drug or alcohol abuse problem  kidney disease  liver disease  lung or breathing disease, like asthma  mental illness  myasthenia gravis  Parkinson's disease  suicidal thoughts, plans, or attempt; a previous suicide attempt by you or a family member  an unusual or allergic reaction to lorazepam, other medicines, foods, dyes, or preservatives  pregnant or trying to get pregnant  breast-feeding How should I use this medicine? This medicine is for injection into a muscle or into a vein. It is given by a health care professional in a hospital or clinic setting. Talk to your  pediatrician regarding the use of this medicine in children. Special care may be needed. Overdosage: If you think you have taken too much of this medicine contact a poison control center or emergency room at once. NOTE: This medicine is only for you. Do not share this medicine with others. What if I miss a dose? This does not apply. What may interact with this medicine? Do not take this medicine with any of the following medications:  narcotic medicines for cough  sodium oxybate This medicine  may also interact with the following medications:  alcohol  antihistamines for allergy, cough and cold  certain medicines for anxiety or sleep  certain medicines for depression, like amitriptyline, fluoxetine, sertraline  certain medicines for seizures like carbamazepine, phenobarbital, phenytoin, primidone  general anesthetics like lidocaine, pramoxine, tetracaine  MAOIs like Carbex, Eldepryl, Marplan, Nardil, and Parnate  medicines that relax muscles for surgery  narcotic medicines for pain  phenothiazines like chlorpromazine, mesoridazine, prochlorperazine, thioridazine This list may not describe all possible interactions. Give your health care provider a list of all the medicines, herbs, non-prescription drugs, or dietary supplements you use. Also tell them if you smoke, drink alcohol, or use illegal drugs. Some items may interact with your medicine. What should I watch for while using this medicine? Tell your doctor or health care professional if your symptoms do not start to get better or if they get worse. You may get drowsy or dizzy. Do not drive, use machinery, or do anything that needs mental alertness until you know how this medicine affects you. Do not stand or sit up quickly, especially if you are an older patient. This reduces the risk of dizzy or fainting spells. Alcohol may interfere with the effect of this medicine. Avoid alcoholic drinks. If you are taking another medicine that also causes drowsiness, you may have more side effects. Give your health care provider a list of all medicines you use. Your doctor will tell you how much medicine to take. Do not take more medicine than directed. Call emergency for help if you have problems breathing or unusual sleepiness. What side effects may I notice from receiving this medicine? Side effects that you should report to your doctor or health care professional as soon as possible:  allergic reactions like skin rash, itching or  hives, swelling of the face, lips, or tongue  breathing problems  confusion  loss of balance or coordination  signs and symptoms of low blood pressure like dizziness; feeling faint or lightheaded, falls; unusually weak or tired  suicidal thoughts or other mood changes Side effects that usually do not require medical attention (report to your doctor or health care professional if they continue or are bothersome):  dizziness  headache  nausea, vomiting  pain, redness, or irritation at the site where injected  tiredness This list may not describe all possible side effects. Call your doctor for medical advice about side effects. You may report side effects to FDA at 1-800-FDA-1088. Where should I keep my medicine? This medication will be given to you in a hospital or health clinic setting. You will not be given this medicine to take home. NOTE: This sheet is a summary. It may not cover all possible information. If you have questions about this medicine, talk to your doctor, pharmacist, or health care provider.  2020 Elsevier/Gold Standard (2015-02-14 14:45:14)

## 2020-05-27 NOTE — Telephone Encounter (Signed)
The patient underwent CT head with and without contrast today due to 4-5 week history of nausea, vomiting and dizziness.  She initially saw her primary care provider and was felt to have vertigo placed on meclizine.  She saw Dr. Bobby Rumpf on November 29th, at which time her CEA was found to be elevated at 12.4.  A PET scan was therefore scheduled.  She also had iron deficiency, so was scheduled for IV Feraheme.  She had persistent symptoms of nausea, vomiting and dizziness, as well as vision changes, with intermittent blurry vision and diplopia when at the infusion center, so I recommended a CT head as soon as possible. Over the past 2 weeks she has been receiving IV fluids and antiemetics , as well as taking oral antiemetics. Unfortunately, the CT revealed a  3.8 x 3.4 x 4.2 cm peripherally enhancing and centrally necrotic mass within the left cerebellum with moderate surrounding edema and posterior fossa mass effect, as well as partial effacement and displacement of the 4th ventricle. Prominence of the lateral and 3rd ventricles compatible with at least mild hydrocephalus was also seen.  Precontrast hyperdensity of the mass may reflect hemorrhage or mineralization.  There was a 9 mm enhancing focus adjacent to the dominant lesion which may reflect a pedunculated extension of the same lesion or a small adjacent lesion.  No other intracranial lesions were identified. MRI was recommended.  The patient was given IV dexamethasone 10 mg today prior to an MRI head and she was given dexamethasone 4 mg 3 times a day to continue orally.  Unfortunately, due to the recent  Feraheme injection gadolinium contrast was not able to be administered.  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  I contacted [facility] and spoke with Dr. Saintclair Halsted, who felt if the patient is stable he could see her as an outpatient tomorrow. The patient has not had further decline per her mother and is able to keep some fluids down. They will plan to present to Dr. Windy Carina office in the morning.

## 2020-05-28 ENCOUNTER — Other Ambulatory Visit: Payer: Self-pay

## 2020-05-28 ENCOUNTER — Encounter (HOSPITAL_COMMUNITY): Payer: Self-pay | Admitting: Neurosurgery

## 2020-05-28 ENCOUNTER — Other Ambulatory Visit: Payer: Self-pay | Admitting: Neurosurgery

## 2020-05-28 ENCOUNTER — Other Ambulatory Visit (HOSPITAL_COMMUNITY)
Admission: RE | Admit: 2020-05-28 | Discharge: 2020-05-28 | Disposition: A | Discharge status: Home / Self Care | Payer: PRIVATE HEALTH INSURANCE | Source: Ambulatory Visit | Attending: Neurosurgery | Admitting: Neurosurgery

## 2020-05-28 DIAGNOSIS — Z20822 Contact with and (suspected) exposure to covid-19: Secondary | ICD-10-CM | POA: Diagnosis not present

## 2020-05-28 DIAGNOSIS — Z01812 Encounter for preprocedural laboratory examination: Secondary | ICD-10-CM | POA: Insufficient documentation

## 2020-05-28 NOTE — Progress Notes (Signed)
I spoke with Ms Shannon [name], who asked me to speak to her mother, Shannon [name].  The two are traveling in a car with a driver from having Brittie' Covid test done.   Ms. Botelho denies chest pain or shortness of breath.  [name] has type II diabetes.  Patient reports that CBG runs 68- 100. I instructed patient to not take Metformin in am and if CBG is greater than 70 to take 1/2 of scheduled Lantus- 22 units. I instructed patient to check CBG after awaking and every 2 hours until arrival  to the hospital.  I Instructed patient if CBG is less than 70 to drink  1/2 cup of a clear juice. Recheck CBG in 15 minutes then call pre- op desk at [phone] for further instructions.

## 2020-05-29 ENCOUNTER — Inpatient Hospital Stay (HOSPITAL_COMMUNITY): Payer: PRIVATE HEALTH INSURANCE | Admitting: Certified Registered"

## 2020-05-29 ENCOUNTER — Other Ambulatory Visit: Payer: Self-pay

## 2020-05-29 ENCOUNTER — Encounter (HOSPITAL_COMMUNITY): Payer: Self-pay | Admitting: Neurosurgery

## 2020-05-29 ENCOUNTER — Encounter: Payer: Self-pay | Admitting: Oncology

## 2020-05-29 ENCOUNTER — Encounter: Payer: Self-pay | Admitting: Hematology and Oncology

## 2020-05-29 ENCOUNTER — Encounter (HOSPITAL_COMMUNITY)
Admission: RE | Disposition: A | Discharge status: Rehabilitation Facility | Payer: Self-pay | Source: Home / Self Care | Attending: Neurosurgery

## 2020-05-29 ENCOUNTER — Inpatient Hospital Stay (HOSPITAL_COMMUNITY)
Admission: RE | Admit: 2020-05-29 | Discharge: 2020-06-06 | DRG: 025 | Disposition: A | Discharge status: Rehabilitation Facility | Payer: PRIVATE HEALTH INSURANCE | Attending: Neurosurgery | Admitting: Neurosurgery

## 2020-05-29 DIAGNOSIS — Z803 Family history of malignant neoplasm of breast: Secondary | ICD-10-CM

## 2020-05-29 DIAGNOSIS — Z86718 Personal history of other venous thrombosis and embolism: Secondary | ICD-10-CM | POA: Diagnosis not present

## 2020-05-29 DIAGNOSIS — Z85038 Personal history of other malignant neoplasm of large intestine: Secondary | ICD-10-CM

## 2020-05-29 DIAGNOSIS — Z79899 Other long term (current) drug therapy: Secondary | ICD-10-CM

## 2020-05-29 DIAGNOSIS — E1169 Type 2 diabetes mellitus with other specified complication: Secondary | ICD-10-CM | POA: Diagnosis not present

## 2020-05-29 DIAGNOSIS — Z79890 Hormone replacement therapy (postmenopausal): Secondary | ICD-10-CM

## 2020-05-29 DIAGNOSIS — Z20822 Contact with and (suspected) exposure to covid-19: Secondary | ICD-10-CM | POA: Diagnosis present

## 2020-05-29 DIAGNOSIS — Z7952 Long term (current) use of systemic steroids: Secondary | ICD-10-CM

## 2020-05-29 DIAGNOSIS — Z880 Allergy status to penicillin: Secondary | ICD-10-CM | POA: Diagnosis not present

## 2020-05-29 DIAGNOSIS — Z794 Long term (current) use of insulin: Secondary | ICD-10-CM

## 2020-05-29 DIAGNOSIS — G936 Cerebral edema: Secondary | ICD-10-CM | POA: Diagnosis present

## 2020-05-29 DIAGNOSIS — Z923 Personal history of irradiation: Secondary | ICD-10-CM

## 2020-05-29 DIAGNOSIS — Z8371 Family history of colonic polyps: Secondary | ICD-10-CM

## 2020-05-29 DIAGNOSIS — Z833 Family history of diabetes mellitus: Secondary | ICD-10-CM | POA: Diagnosis not present

## 2020-05-29 DIAGNOSIS — K219 Gastro-esophageal reflux disease without esophagitis: Secondary | ICD-10-CM | POA: Diagnosis present

## 2020-05-29 DIAGNOSIS — R260 Ataxic gait: Secondary | ICD-10-CM | POA: Diagnosis present

## 2020-05-29 DIAGNOSIS — G9389 Other specified disorders of brain: Secondary | ICD-10-CM | POA: Diagnosis not present

## 2020-05-29 DIAGNOSIS — Z9221 Personal history of antineoplastic chemotherapy: Secondary | ICD-10-CM

## 2020-05-29 DIAGNOSIS — Z881 Allergy status to other antibiotic agents status: Secondary | ICD-10-CM | POA: Diagnosis not present

## 2020-05-29 DIAGNOSIS — E119 Type 2 diabetes mellitus without complications: Secondary | ICD-10-CM | POA: Diagnosis present

## 2020-05-29 DIAGNOSIS — E039 Hypothyroidism, unspecified: Secondary | ICD-10-CM | POA: Diagnosis present

## 2020-05-29 DIAGNOSIS — R059 Cough, unspecified: Secondary | ICD-10-CM

## 2020-05-29 DIAGNOSIS — G441 Vascular headache, not elsewhere classified: Secondary | ICD-10-CM | POA: Diagnosis not present

## 2020-05-29 DIAGNOSIS — C7931 Secondary malignant neoplasm of brain: Secondary | ICD-10-CM | POA: Diagnosis present

## 2020-05-29 DIAGNOSIS — E669 Obesity, unspecified: Secondary | ICD-10-CM | POA: Diagnosis not present

## 2020-05-29 HISTORY — DX: Type 2 diabetes mellitus without complications: E11.9

## 2020-05-29 HISTORY — DX: Gastro-esophageal reflux disease without esophagitis: K21.9

## 2020-05-29 HISTORY — DX: Personal history of other medical treatment: Z92.89

## 2020-05-29 HISTORY — PX: SUBOCCIPITAL CRANIECTOMY CERVICAL LAMINECTOMY: SHX5404

## 2020-05-29 LAB — POCT I-STAT 7, (LYTES, BLD GAS, ICA,H+H)
Acid-Base Excess: 4 mmol/L — ABNORMAL HIGH (ref 0.0–2.0)
Bicarbonate: 27.8 mmol/L (ref 20.0–28.0)
Calcium, Ion: 1.13 mmol/L — ABNORMAL LOW (ref 1.15–1.40)
HCT: 36 % (ref 36.0–46.0)
Hemoglobin: 12.2 g/dL (ref 12.0–15.0)
O2 Saturation: 100 %
Potassium: 4.2 mmol/L (ref 3.5–5.1)
Sodium: 137 mmol/L (ref 135–145)
TCO2: 29 mmol/L (ref 22–32)
pCO2 arterial: 39.9 mmHg (ref 32.0–48.0)
pH, Arterial: 7.451 — ABNORMAL HIGH (ref 7.350–7.450)
pO2, Arterial: 217 mmHg — ABNORMAL HIGH (ref 83.0–108.0)

## 2020-05-29 LAB — CBC
HCT: 35 % — ABNORMAL LOW (ref 36.0–46.0)
Hemoglobin: 12 g/dL (ref 12.0–15.0)
MCH: 30.5 pg (ref 26.0–34.0)
MCHC: 34.3 g/dL (ref 30.0–36.0)
MCV: 88.8 fL (ref 80.0–100.0)
Platelets: 120 10*3/uL — ABNORMAL LOW (ref 150–400)
RBC: 3.94 MIL/uL (ref 3.87–5.11)
RDW: 13.3 % (ref 11.5–15.5)
WBC: 7.6 10*3/uL (ref 4.0–10.5)
nRBC: 0 % (ref 0.0–0.2)

## 2020-05-29 LAB — BASIC METABOLIC PANEL
Anion gap: 8 (ref 5–15)
BUN: 11 mg/dL (ref 6–20)
CO2: 24 mmol/L (ref 22–32)
Calcium: 8.1 mg/dL — ABNORMAL LOW (ref 8.9–10.3)
Chloride: 106 mmol/L (ref 98–111)
Creatinine, Ser: 1 mg/dL (ref 0.44–1.00)
GFR, Estimated: >60 mL/min (ref >60)
Glucose, Bld: 195 mg/dL — ABNORMAL HIGH (ref 70–99)
Potassium: 4.5 mmol/L (ref 3.5–5.1)
Sodium: 138 mmol/L (ref 135–145)

## 2020-05-29 LAB — TYPE AND SCREEN
ABO/RH(D): A POS
Antibody Screen: NEGATIVE

## 2020-05-29 LAB — GLUCOSE, CAPILLARY
Glucose-Capillary: 206 mg/dL — ABNORMAL HIGH (ref 70–99)
Glucose-Capillary: 159 mg/dL — ABNORMAL HIGH (ref 70–99)
Glucose-Capillary: 260 mg/dL — ABNORMAL HIGH (ref 70–99)

## 2020-05-29 LAB — SARS CORONAVIRUS 2 BY RT PCR (HOSPITAL ORDER, PERFORMED IN ~~LOC~~ HOSPITAL LAB): SARS Coronavirus 2: NEGATIVE

## 2020-05-29 LAB — SARS CORONAVIRUS 2 (TAT 6-24 HRS): SARS Coronavirus 2: NEGATIVE

## 2020-05-29 LAB — MRSA PCR SCREENING: MRSA by PCR: NEGATIVE

## 2020-05-29 LAB — ABO/RH: ABO/RH(D): A POS

## 2020-05-29 SURGERY — SUBOCCIPITAL CRANIECTOMY CERVICAL LAMINECTOMY/DURAPLASTY
Anesthesia: General | Site: Head | Laterality: Left

## 2020-05-29 MED ORDER — DEXAMETHASONE SODIUM PHOSPHATE 10 MG/ML IJ SOLN
INTRAMUSCULAR | Status: DC | PRN
  Administered 2020-05-29: 10 mg via INTRAVENOUS

## 2020-05-29 MED ORDER — THROMBIN 5000 UNITS EX SOLR
CUTANEOUS | Status: AC
  Filled 2020-05-29: qty 5000

## 2020-05-29 MED ORDER — LEVOTHYROXINE SODIUM 75 MCG PO TABS
75.0000 ug | ORAL_TABLET | Freq: Every day | ORAL | Status: DC
  Administered 2020-05-30 – 2020-06-06 (×8): 75 ug via ORAL
  Filled 2020-05-29 (×8): qty 1

## 2020-05-29 MED ORDER — HYDROMORPHONE HCL 1 MG/ML IJ SOLN
0.2500 mg | INTRAMUSCULAR | Status: DC | PRN
  Administered 2020-05-29 (×4): 0.5 mg via INTRAVENOUS

## 2020-05-29 MED ORDER — SODIUM CHLORIDE 0.9 % IV SOLN
0.0125 ug/kg/min | INTRAVENOUS | Status: AC
  Administered 2020-05-29: .1 ug/kg/min via INTRAVENOUS
  Filled 2020-05-29: qty 2000

## 2020-05-29 MED ORDER — ONDANSETRON HCL 4 MG/2ML IJ SOLN: 4.0000 mg | Freq: Once | INTRAMUSCULAR | Status: DC | PRN

## 2020-05-29 MED ORDER — LABETALOL HCL 5 MG/ML IV SOLN
10.0000 mg | INTRAVENOUS | Status: DC | PRN
  Filled 2020-05-29: qty 4

## 2020-05-29 MED ORDER — BUPIVACAINE HCL (PF) 0.25 % IJ SOLN
INTRAMUSCULAR | Status: AC
  Filled 2020-05-29: qty 30

## 2020-05-29 MED ORDER — ORAL CARE MOUTH RINSE: 15.0000 mL | Freq: Once | OROMUCOSAL | Status: AC

## 2020-05-29 MED ORDER — CHLORHEXIDINE GLUCONATE CLOTH 2 % EX PADS: 6.0000 | MEDICATED_PAD | Freq: Once | CUTANEOUS | Status: DC

## 2020-05-29 MED ORDER — DEXAMETHASONE SODIUM PHOSPHATE 4 MG/ML IJ SOLN
4.0000 mg | Freq: Three times a day (TID) | INTRAMUSCULAR | Status: DC
  Administered 2020-05-31 – 2020-06-01 (×2): 4 mg via INTRAVENOUS
  Filled 2020-05-29 (×3): qty 1

## 2020-05-29 MED ORDER — ATORVASTATIN CALCIUM 10 MG PO TABS
10.0000 mg | ORAL_TABLET | Freq: Every day | ORAL | Status: DC
  Administered 2020-05-29 – 2020-06-05 (×8): 10 mg via ORAL
  Filled 2020-05-29 (×8): qty 1

## 2020-05-29 MED ORDER — THROMBIN 5000 UNITS EX SOLR: OROMUCOSAL | Status: DC | PRN

## 2020-05-29 MED ORDER — ACETAMINOPHEN 10 MG/ML IV SOLN
1000.0000 mg | Freq: Once | INTRAVENOUS | Status: DC | PRN
  Administered 2020-05-29: 1000 mg via INTRAVENOUS

## 2020-05-29 MED ORDER — HYDROMORPHONE HCL 1 MG/ML IJ SOLN
0.5000 mg | INTRAMUSCULAR | Status: DC | PRN
  Administered 2020-05-29 – 2020-05-30 (×2): 1 mg via INTRAVENOUS
  Administered 2020-05-30: 0.5 mg via INTRAVENOUS
  Administered 2020-05-30 – 2020-06-01 (×3): 1 mg via INTRAVENOUS
  Administered 2020-06-04: 0.5 mg via INTRAVENOUS
  Administered 2020-06-04 – 2020-06-06 (×6): 1 mg via INTRAVENOUS
  Filled 2020-05-29 (×13): qty 1

## 2020-05-29 MED ORDER — CHLORHEXIDINE GLUCONATE CLOTH 2 % EX PADS
6.0000 | MEDICATED_PAD | Freq: Every day | CUTANEOUS | Status: DC
  Administered 2020-05-30 – 2020-06-04 (×6): 6 via TOPICAL

## 2020-05-29 MED ORDER — BACITRACIN ZINC 500 UNIT/GM EX OINT
TOPICAL_OINTMENT | CUTANEOUS | Status: AC
  Filled 2020-05-29: qty 28.35

## 2020-05-29 MED ORDER — SODIUM CHLORIDE 0.9 % IV SOLN: INTRAVENOUS | Status: DC | PRN

## 2020-05-29 MED ORDER — HYDROCODONE-ACETAMINOPHEN 5-325 MG PO TABS
1.0000 | ORAL_TABLET | ORAL | Status: DC | PRN
  Administered 2020-05-31 – 2020-06-03 (×4): 1 via ORAL
  Filled 2020-05-29 (×4): qty 1

## 2020-05-29 MED ORDER — 0.9 % SODIUM CHLORIDE (POUR BTL) OPTIME
TOPICAL | Status: DC | PRN
  Administered 2020-05-29 (×3): 1000 mL

## 2020-05-29 MED ORDER — FENTANYL CITRATE (PF) 250 MCG/5ML IJ SOLN
INTRAMUSCULAR | Status: AC
  Filled 2020-05-29: qty 5

## 2020-05-29 MED ORDER — PROMETHAZINE HCL 25 MG PO TABS
12.5000 mg | ORAL_TABLET | ORAL | Status: DC | PRN
  Filled 2020-05-29: qty 1

## 2020-05-29 MED ORDER — ROCURONIUM BROMIDE 10 MG/ML (PF) SYRINGE
PREFILLED_SYRINGE | INTRAVENOUS | Status: DC | PRN
  Administered 2020-05-29: 70 mg via INTRAVENOUS
  Administered 2020-05-29: 30 mg via INTRAVENOUS
  Administered 2020-05-29: 50 mg via INTRAVENOUS

## 2020-05-29 MED ORDER — DEXAMETHASONE SODIUM PHOSPHATE 10 MG/ML IJ SOLN
6.0000 mg | Freq: Four times a day (QID) | INTRAMUSCULAR | Status: AC
  Administered 2020-05-29 – 2020-05-30 (×4): 6 mg via INTRAVENOUS
  Filled 2020-05-29 (×4): qty 1

## 2020-05-29 MED ORDER — MIDAZOLAM HCL 5 MG/5ML IJ SOLN
INTRAMUSCULAR | Status: DC | PRN
  Administered 2020-05-29: 2 mg via INTRAVENOUS

## 2020-05-29 MED ORDER — ACETAMINOPHEN 10 MG/ML IV SOLN
INTRAVENOUS | Status: AC
  Filled 2020-05-29: qty 100

## 2020-05-29 MED ORDER — ONDANSETRON HCL 4 MG/2ML IJ SOLN: 4.0000 mg | INTRAMUSCULAR | Status: DC | PRN

## 2020-05-29 MED ORDER — FENTANYL CITRATE (PF) 250 MCG/5ML IJ SOLN
INTRAMUSCULAR | Status: DC | PRN
  Administered 2020-05-29: 150 ug via INTRAVENOUS

## 2020-05-29 MED ORDER — DEXAMETHASONE SODIUM PHOSPHATE 4 MG/ML IJ SOLN
4.0000 mg | Freq: Four times a day (QID) | INTRAMUSCULAR | Status: AC
  Administered 2020-05-30 – 2020-05-31 (×4): 4 mg via INTRAVENOUS
  Filled 2020-05-29 (×4): qty 1

## 2020-05-29 MED ORDER — PHENYLEPHRINE HCL-NACL 10-0.9 MG/250ML-% IV SOLN
INTRAVENOUS | Status: DC | PRN
  Administered 2020-05-29: 30 ug/min via INTRAVENOUS

## 2020-05-29 MED ORDER — THROMBIN 20000 UNITS EX SOLR
CUTANEOUS | Status: AC
  Filled 2020-05-29: qty 20000

## 2020-05-29 MED ORDER — HYDROMORPHONE HCL 1 MG/ML IJ SOLN
INTRAMUSCULAR | Status: AC
  Filled 2020-05-29: qty 1

## 2020-05-29 MED ORDER — MIDAZOLAM HCL 2 MG/2ML IJ SOLN
INTRAMUSCULAR | Status: AC
  Filled 2020-05-29: qty 2

## 2020-05-29 MED ORDER — HEMOSTATIC AGENTS (NO CHARGE) OPTIME
TOPICAL | Status: DC | PRN
  Administered 2020-05-29: 1 via TOPICAL

## 2020-05-29 MED ORDER — BACITRACIN ZINC 500 UNIT/GM EX OINT
TOPICAL_OINTMENT | CUTANEOUS | Status: DC | PRN
  Administered 2020-05-29: 1 via TOPICAL

## 2020-05-29 MED ORDER — LACTATED RINGERS IV SOLN: INTRAVENOUS | Status: DC

## 2020-05-29 MED ORDER — CHLORHEXIDINE GLUCONATE 0.12 % MT SOLN
15.0000 mL | Freq: Once | OROMUCOSAL | Status: AC
  Administered 2020-05-29: 15 mL via OROMUCOSAL
  Filled 2020-05-29: qty 15

## 2020-05-29 MED ORDER — PROPOFOL 10 MG/ML IV BOLUS
INTRAVENOUS | Status: DC | PRN
  Administered 2020-05-29: 150 mg via INTRAVENOUS
  Administered 2020-05-29: 50 mg via INTRAVENOUS

## 2020-05-29 MED ORDER — QUETIAPINE FUMARATE 200 MG PO TABS
800.0000 mg | ORAL_TABLET | Freq: Every day | ORAL | Status: DC
  Administered 2020-05-29 – 2020-06-05 (×8): 800 mg via ORAL
  Filled 2020-05-29 (×8): qty 4

## 2020-05-29 MED ORDER — POTASSIUM CHLORIDE IN NACL 20-0.9 MEQ/L-% IV SOLN
INTRAVENOUS | Status: DC
  Filled 2020-05-29 (×4): qty 1000

## 2020-05-29 MED ORDER — ONDANSETRON HCL 4 MG/2ML IJ SOLN
INTRAMUSCULAR | Status: DC | PRN
  Administered 2020-05-29: 4 mg via INTRAVENOUS

## 2020-05-29 MED ORDER — LIDOCAINE-EPINEPHRINE 1 %-1:100000 IJ SOLN
INTRAMUSCULAR | Status: AC
  Filled 2020-05-29: qty 1

## 2020-05-29 MED ORDER — SUGAMMADEX SODIUM 200 MG/2ML IV SOLN
INTRAVENOUS | Status: DC | PRN
  Administered 2020-05-29: 200 mg via INTRAVENOUS

## 2020-05-29 MED ORDER — LAMOTRIGINE 25 MG PO TABS
150.0000 mg | ORAL_TABLET | Freq: Every day | ORAL | Status: DC
  Administered 2020-05-29 – 2020-06-05 (×8): 150 mg via ORAL
  Filled 2020-05-29 (×8): qty 2

## 2020-05-29 MED ORDER — DOCUSATE SODIUM 100 MG PO CAPS
100.0000 mg | ORAL_CAPSULE | Freq: Two times a day (BID) | ORAL | Status: DC
  Administered 2020-05-30 – 2020-06-06 (×14): 100 mg via ORAL
  Filled 2020-05-29 (×16): qty 1

## 2020-05-29 MED ORDER — LIDOCAINE 2% (20 MG/ML) 5 ML SYRINGE
INTRAMUSCULAR | Status: DC | PRN
  Administered 2020-05-29: 60 mg via INTRAVENOUS

## 2020-05-29 MED ORDER — VANCOMYCIN HCL IN DEXTROSE 1-5 GM/200ML-% IV SOLN
1000.0000 mg | INTRAVENOUS | Status: DC
  Filled 2020-05-29: qty 200

## 2020-05-29 MED ORDER — PANTOPRAZOLE SODIUM 40 MG IV SOLR
40.0000 mg | Freq: Every day | INTRAVENOUS | Status: DC
  Administered 2020-05-29 – 2020-06-01 (×4): 40 mg via INTRAVENOUS
  Filled 2020-05-29 (×4): qty 40

## 2020-05-29 MED ORDER — VANCOMYCIN HCL IN DEXTROSE 1-5 GM/200ML-% IV SOLN
1000.0000 mg | Freq: Three times a day (TID) | INTRAVENOUS | Status: AC
Start: 2020-05-29 — End: 2020-05-30
  Administered 2020-05-29 – 2020-05-30 (×2): 1000 mg via INTRAVENOUS
  Filled 2020-05-29 (×2): qty 200

## 2020-05-29 MED ORDER — SODIUM CHLORIDE 0.9 % IV SOLN: INTRAVENOUS | Status: DC

## 2020-05-29 MED ORDER — LIDOCAINE-EPINEPHRINE 1 %-1:100000 IJ SOLN
INTRAMUSCULAR | Status: DC | PRN
  Administered 2020-05-29: 10 mL

## 2020-05-29 MED ORDER — FLUVOXAMINE MALEATE 100 MG PO TABS
300.0000 mg | ORAL_TABLET | Freq: Every day | ORAL | Status: DC
  Administered 2020-05-29 – 2020-06-05 (×8): 300 mg via ORAL
  Filled 2020-05-29 (×9): qty 3

## 2020-05-29 MED ORDER — ONDANSETRON HCL 4 MG PO TABS: 4.0000 mg | ORAL_TABLET | ORAL | Status: DC | PRN

## 2020-05-29 MED ORDER — PHENYLEPHRINE 40 MCG/ML (10ML) SYRINGE FOR IV PUSH (FOR BLOOD PRESSURE SUPPORT)
PREFILLED_SYRINGE | INTRAVENOUS | Status: DC | PRN
  Administered 2020-05-29: 120 ug via INTRAVENOUS
  Administered 2020-05-29: 240 ug via INTRAVENOUS

## 2020-05-29 MED ORDER — THROMBIN 20000 UNITS EX SOLR: CUTANEOUS | Status: DC | PRN

## 2020-05-29 SURGICAL SUPPLY — 77 items
ADH SKN CLS APL DERMABOND .7 (GAUZE/BANDAGES/DRESSINGS) ×1
APL SKNCLS STERI-STRIP NONHPOA (GAUZE/BANDAGES/DRESSINGS) ×1
BAND INSRT 18 STRL LF DISP RB (MISCELLANEOUS) ×2
BAND RUBBER #18 3X1/16 STRL (MISCELLANEOUS) ×4 IMPLANT
BENZOIN TINCTURE PRP APPL 2/3 (GAUZE/BANDAGES/DRESSINGS) ×2 IMPLANT
BLADE CLIPPER SURG (BLADE) ×2 IMPLANT
BLADE SURG 11 STRL SS (BLADE) ×2 IMPLANT
BLADE ULTRA TIP 2M (BLADE) IMPLANT
BUR ACORN 9.0 PRECISION (BURR) ×2 IMPLANT
CABLE BIPOLOR RESECTION CORD (MISCELLANEOUS) IMPLANT
CANISTER SUCT 3000ML PPV (MISCELLANEOUS) ×2 IMPLANT
CARTRIDGE OIL MAESTRO DRILL (MISCELLANEOUS) ×1 IMPLANT
CLIP VESOCCLUDE MED 6/CT (CLIP) IMPLANT
COVER WAND RF STERILE (DRAPES) IMPLANT
DERMABOND ADVANCED (GAUZE/BANDAGES/DRESSINGS) ×1
DERMABOND ADVANCED .7 DNX12 (GAUZE/BANDAGES/DRESSINGS) ×1 IMPLANT
DIFFUSER DRILL AIR PNEUMATIC (MISCELLANEOUS) ×2 IMPLANT
DRAPE LAPAROTOMY 100X72 PEDS (DRAPES) ×2 IMPLANT
DRAPE MICROSCOPE LEICA (MISCELLANEOUS) ×2 IMPLANT
DRAPE SURG 17X23 STRL (DRAPES) ×4 IMPLANT
DRAPE WARM FLUID 44X44 (DRAPES) ×2 IMPLANT
DRSG OPSITE POSTOP 4X8 (GAUZE/BANDAGES/DRESSINGS) ×2 IMPLANT
ELECT REM PT RETURN 9FT ADLT (ELECTROSURGICAL) ×2
ELECTRODE REM PT RTRN 9FT ADLT (ELECTROSURGICAL) ×1 IMPLANT
EVACUATOR 1/8 PVC DRAIN (DRAIN) IMPLANT
EVACUATOR SILICONE 100CC (DRAIN) IMPLANT
FORCEPS BIPOLAR SPETZLER 8 1.0 (NEUROSURGERY SUPPLIES) ×2 IMPLANT
GAUZE 4X4 16PLY RFD (DISPOSABLE) IMPLANT
GAUZE SPONGE 4X4 12PLY STRL (GAUZE/BANDAGES/DRESSINGS) IMPLANT
GLOVE BIO SURGEON STRL SZ7 (GLOVE) ×2 IMPLANT
GLOVE BIO SURGEON STRL SZ7.5 (GLOVE) IMPLANT
GLOVE BIO SURGEON STRL SZ8 (GLOVE) ×2 IMPLANT
GLOVE BIOGEL PI IND STRL 7.0 (GLOVE) ×2 IMPLANT
GLOVE BIOGEL PI IND STRL 7.5 (GLOVE) ×3 IMPLANT
GLOVE BIOGEL PI INDICATOR 7.0 (GLOVE) ×2
GLOVE BIOGEL PI INDICATOR 7.5 (GLOVE) ×3
GLOVE EXAM NITRILE XL STR (GLOVE) IMPLANT
GLOVE INDICATOR 8.5 STRL (GLOVE) ×2 IMPLANT
GLOVE SURG SS PI 7.0 STRL IVOR (GLOVE) ×12 IMPLANT
GLOVE SURG UNDER POLY LF SZ7 (GLOVE) ×6 IMPLANT
GOWN STRL REUS W/ TWL LRG LVL3 (GOWN DISPOSABLE) ×1 IMPLANT
GOWN STRL REUS W/ TWL XL LVL3 (GOWN DISPOSABLE) ×4 IMPLANT
GOWN STRL REUS W/TWL 2XL LVL3 (GOWN DISPOSABLE) IMPLANT
GOWN STRL REUS W/TWL LRG LVL3 (GOWN DISPOSABLE) ×2
GOWN STRL REUS W/TWL XL LVL3 (GOWN DISPOSABLE) ×8
GRAFT DURAGEN MATRIX 2WX2L ×2 IMPLANT
HEMOSTAT POWDER KIT SURGIFOAM (HEMOSTASIS) ×2 IMPLANT
HEMOSTAT SURGICEL 2X14 (HEMOSTASIS) ×2 IMPLANT
KIT BASIN OR (CUSTOM PROCEDURE TRAY) ×2 IMPLANT
KIT TURNOVER KIT B (KITS) ×2 IMPLANT
NEEDLE HYPO 22GX1.5 SAFETY (NEEDLE) ×2 IMPLANT
NS IRRIG 1000ML POUR BTL (IV SOLUTION) ×6 IMPLANT
OIL CARTRIDGE MAESTRO DRILL (MISCELLANEOUS) ×2
PACK LAMINECTOMY NEURO (CUSTOM PROCEDURE TRAY) ×2 IMPLANT
PAD ARMBOARD 7.5X6 YLW CONV (MISCELLANEOUS) ×6 IMPLANT
PATTIES SURGICAL .5 X.5 (GAUZE/BANDAGES/DRESSINGS) ×2 IMPLANT
PATTIES SURGICAL .5 X3 (DISPOSABLE) ×8 IMPLANT
PATTIES SURGICAL 1/4 X 3 (GAUZE/BANDAGES/DRESSINGS) IMPLANT
SPONGE LAP 4X18 RFD (DISPOSABLE) IMPLANT
SPONGE SURGIFOAM ABS GEL 100 (HEMOSTASIS) ×2 IMPLANT
SPONGE SURGIFOAM ABS GEL SZ50 (HEMOSTASIS) IMPLANT
STAPLER SKIN PROX WIDE 3.9 (STAPLE) ×2 IMPLANT
SUT ETHILON 2 0 FS 18 (SUTURE) IMPLANT
SUT ETHILON 3 0 FSL (SUTURE) ×2 IMPLANT
SUT NURALON 4 0 TR CR/8 (SUTURE) ×4 IMPLANT
SUT PROLENE 6 0 BV (SUTURE) IMPLANT
SUT VIC AB 0 CT1 18XCR BRD8 (SUTURE) ×1 IMPLANT
SUT VIC AB 0 CT1 8-18 (SUTURE) ×2
SUT VIC AB 1 CT1 18XBRD ANBCTR (SUTURE) ×1 IMPLANT
SUT VIC AB 1 CT1 8-18 (SUTURE) ×2
SUT VIC AB 2-0 CT1 18 (SUTURE) ×2 IMPLANT
SUT VIC AB 3-0 SH 8-18 (SUTURE) ×4 IMPLANT
TOWEL GREEN STERILE (TOWEL DISPOSABLE) ×2 IMPLANT
TOWEL GREEN STERILE FF (TOWEL DISPOSABLE) ×2 IMPLANT
TRAY FOLEY MTR SLVR 16FR STAT (SET/KITS/TRAYS/PACK) ×2 IMPLANT
UNDERPAD 30X36 HEAVY ABSORB (UNDERPADS AND DIAPERS) IMPLANT
WATER STERILE IRR 1000ML POUR (IV SOLUTION) ×2 IMPLANT

## 2020-05-29 NOTE — Anesthesia Procedure Notes (Signed)
Arterial Line Insertion Start/End12/29/2021 8:25 AM, 05/29/2020 8:35 AM Performed by: Atilano Median, DO, Macie Burows, CRNA, CRNA  Patient location: Pre-op. Preanesthetic checklist: patient identified, IV checked, site marked, risks and benefits discussed, surgical consent, monitors and equipment checked, pre-op evaluation, timeout performed and anesthesia consent Lidocaine 1% used for infiltration Right, radial was placed Catheter size: 20 G Hand hygiene performed  and maximum sterile barriers used   Attempts: 1 Procedure performed without using ultrasound guided technique. Following insertion, dressing applied and Biopatch. Post procedure assessment: normal and unchanged

## 2020-05-29 NOTE — Anesthesia Preprocedure Evaluation (Signed)
Anesthesia Evaluation  Patient identified by MRN, date of birth, ID band Patient awake    Reviewed: Allergy & Precautions, NPO status , Patient's Chart, lab work & pertinent test results  Airway Mallampati: II  TM Distance: >3 FB Neck ROM: Full    Dental  (+) Teeth Intact   Pulmonary neg pulmonary ROS,    Pulmonary exam normal        Cardiovascular + DVT   Rhythm:Regular Rate:Normal     Neuro/Psych Anxiety Depression Left cerebellar brain metastasis    GI/Hepatic Neg liver ROS, GERD  Controlled,Colorectal cancer s/p chemo rads   Endo/Other  diabetes, Well Controlled, Type 2, Insulin Dependent, Oral Hypoglycemic Agents  Renal/GU negative Renal ROS  negative genitourinary   Musculoskeletal negative musculoskeletal ROS (+)   Abdominal (+)  Abdomen: soft. Bowel sounds: normal.  Peds  Hematology  (+) anemia ,   Anesthesia Other Findings   Reproductive/Obstetrics                             Anesthesia Physical Anesthesia Plan  ASA: III  Anesthesia Plan: General   Post-op Pain Management:    Induction: Intravenous  PONV Risk Score and Plan: 3 and Dexamethasone, Ondansetron, Midazolam and Treatment may vary due to age or medical condition  Airway Management Planned: Mask and Oral ETT  Additional Equipment: Arterial line  Intra-op Plan:   Post-operative Plan: Extubation in OR  Informed Consent: I have reviewed the patients History and Physical, chart, labs and discussed the procedure including the risks, benefits and alternatives for the proposed anesthesia with the patient or authorized representative who has indicated his/her understanding and acceptance.     Dental advisory given  Plan Discussed with: CRNA  Anesthesia Plan Comments: (Lab Results      Component                Value               Date                      WBC                      6.4                  05/16/2020                HGB                      15.6                05/16/2020                HCT                      46                  05/16/2020                MCV                      87                  05/16/2020                PLT  181                 05/16/2020           Lab Results      Component                Value               Date                      NA                       135 (A)             05/16/2020                K                        3.1 (A)             05/16/2020                CO2                      31 (A)              05/16/2020                BUN                      53 (A)              05/16/2020                CREATININE               1.6 (A)             05/16/2020                CALCIUM                  9.9                 05/16/2020          )        Anesthesia Quick Evaluation

## 2020-05-29 NOTE — H&P (Signed)
Shannon [name] is an 54 y.o. female.   Chief Complaint: Headaches nausea vomiting HPI: 54 year old female with a history of colon cancer status post resection and chemotherapy 3 years ago has had a month of headaches nausea vomiting dizziness and ataxia.  Work-up has shown a large left cerebellar mass due to patient's progression of clinical syndrome imaging findings of a conservative treatment of recommended suboccipital craniectomy for resection of left cerebellar mass.  I extensively gone over the risks and benefits of the operation with her as well as perioperative course expectations of outcome and alternatives of surgery and she understands and agrees to proceed forward.  Past Medical History:  Diagnosis Date  . Anemia    Iron De  . Anxiety   . Blood clot in vein   . Bowel obstruction (HCC)   . Colon cancer (HCC) 2018   treated with surgery and radiation  . Depression   . Diabetes mellitus without complication (HCC)    Type II  . DVT (deep venous thrombosis) (HCC) 2019   behind right knee- while she wasa on chemo  . GERD (gastroesophageal reflux disease)   . History of blood transfusion   . History of chemotherapy   . History of kidney stones 2012   passed  . Hypothyroidism   . Obesity     Past Surgical History:  Procedure Laterality Date  . ABDOMINAL HYSTERECTOMY  05/2019   total laparoscopin with tubes and ovariers removed also.  . APPENDECTOMY  05/2019  . COLON SURGERY  03/2017   Colon resection  . COLONOSCOPY  2019    Family History  Problem Relation Age of Onset  . Irritable bowel syndrome Mother   . Colon polyps Father   . Breast cancer Maternal Grandmother   . Diabetes Maternal Grandmother   . Diabetes Maternal Grandfather   . Breast cancer Paternal Grandmother   . Colon polyps Maternal Uncle   . Stomach cancer Neg Hx   . Pancreatic cancer Neg Hx   . Esophageal cancer Neg Hx   . Colon cancer Neg Hx    Social History:  reports that she has  never smoked. She has never used smokeless tobacco. She reports current alcohol use. She reports that she does not use drugs.  Allergies:  Allergies  Allergen Reactions  . Clindamycin/Lincomycin Rash  . Penicillins Rash    Reaction: 10 years    Medications Prior to Admission  Medication Sig Dispense Refill  . acetaminophen (TYLENOL) 500 MG tablet Take 500-1,000 mg by mouth every 6 (six) hours as needed for moderate pain or mild pain.    Marland Kitchen atorvastatin (LIPITOR) 10 MG tablet Take 10 mg by mouth at bedtime.  0  . diazepam (VALIUM) 2 MG tablet Take 2 mg by mouth 3 (three) times daily as needed.    . Dulaglutide 1.5 MG/0.5ML SOPN Inject 1.5 mg into the skin once a week. Monday    . fluvoxaMINE (LUVOX) 100 MG tablet Take 3 tablets (300 mg total) by mouth at bedtime. 270 tablet 1  . hyoscyamine (LEVSIN SL) 0.125 MG SL tablet Place 1 tablet (0.125 mg total) under the tongue every 6 (six) hours as needed. 45 tablet 1  . insulin aspart (NOVOLOG) 100 UNIT/ML FlexPen Inject 0-12 Units into the skin 3 (three) times daily with meals. Sliding Scale    . lamoTRIgine (LAMICTAL) 150 MG tablet Take 1 tablet (150 mg total) by mouth daily. (Patient taking differently: Take 150 mg by mouth at bedtime.) 90  tablet 3  . LANTUS SOLOSTAR 100 UNIT/ML Solostar Pen Inject 45 Units into the skin daily. Every AM    . levothyroxine (SYNTHROID) 75 MCG tablet Take 75 mcg by mouth daily.    Marland Kitchen LORazepam (ATIVAN) 0.5 MG tablet Take 0.5 mg by mouth every 6 (six) hours as needed for anxiety. .5mg  or 1mg     . meclizine (ANTIVERT) 25 MG tablet Take 25 mg by mouth daily as needed for dizziness.    . metFORMIN (GLUCOPHAGE) 500 MG tablet Take 500-1,000 mg by mouth See admin instructions. 500 mg in the AM 1000 mg tablets in the PM    . ondansetron (ZOFRAN) 4 MG tablet Take 1 tablet (4 mg total) by mouth every 8 (eight) hours as needed for nausea or vomiting. 30 tablet 1  . promethazine (PHENERGAN) 25 MG tablet Take 25 mg by mouth 2  (two) times daily as needed for nausea/vomiting.    . SEROQUEL 400 MG tablet TAKE 2 TABLETS (800 MG TOTAL) BY MOUTH AT BEDTIME. BRAND MEDICALLY NECESSARY 180 tablet 5  . dexamethasone (DECADRON) 4 MG tablet Take 1 tablet (4 mg total) by mouth 3 (three) times daily. 90 tablet 0  . metroNIDAZOLE (FLAGYL) 250 MG tablet Take 1 tablet (250 mg total) by mouth 3 (three) times daily. (Patient not taking: No sig reported) 42 tablet 0    No results found for this or any previous visit (from the past 48 hour(s)). No results found.  Review of Systems  Gastrointestinal: Positive for nausea and vomiting.  Musculoskeletal: Positive for gait problem.  Neurological: Positive for dizziness and headaches.    Last menstrual period 02/22/2019. Physical Exam HENT:     Head: Normocephalic.     Right Ear: Tympanic membrane normal.     Nose: Nose normal.     Mouth/Throat:     Mouth: Mucous membranes are moist.  Eyes:     Pupils: Pupils are equal, round, and reactive to light.  Cardiovascular:     Rate and Rhythm: Normal rate.     Pulses: Normal pulses.  Pulmonary:     Effort: Pulmonary effort is normal.  Abdominal:     General: Abdomen is flat.  Musculoskeletal:        General: Normal range of motion.  Skin:    General: Skin is warm.  Neurological:     General: No focal deficit present.     Mental Status: She is alert.     Comments: Patient is awake and alert pupils are equal extraocular movements are intact positive double vision to left lateral gaze positive dysmetria left side strength 5 out of 5 bilaterally upper and lower extremities ataxic gait      Assessment/Plan 54 year old presents for suboccipital craniectomy for left cerebellar mass  Elaina Hoops, MD 05/29/2020, 7:51 AM

## 2020-05-29 NOTE — Transfer of Care (Signed)
Immediate Anesthesia Transfer of Care Note  Patient: Shannon [name]  Procedure(s) Performed: Suboccipital Craniectomy for Resection of Left Cerebellar Mass (Left Head)  Patient Location: PACU  Anesthesia Type:General  Level of Consciousness: awake, alert , oriented, drowsy and patient cooperative  Airway & Oxygen Therapy: Patient Spontanous Breathing and Patient connected to face mask oxygen  Post-op Assessment: Report given to RN and Post -op Vital signs reviewed and stable  Post vital signs: Reviewed and stable  Last Vitals:  Vitals Value Taken Time  BP 142/92 05/29/20 1243  Temp    Pulse 102 05/29/20 1250  Resp 19 05/29/20 1250  SpO2 100 % 05/29/20 1250  Vitals shown include unvalidated device data.  Last Pain:  Vitals:   05/29/20 0806  TempSrc: Oral  PainSc: 0-No pain         Complications: No complications documented.

## 2020-05-29 NOTE — Op Note (Signed)
Preoperative diagnosis: Left cerebellar metastasis  Postoperative diagnosis: Same  Procedure: Suboccipital craniectomy for resection of left cerebellar metastasis  Surgeon: Jillyn Hidden Culver Feighner  Assistant: Julien Girt  Anesthesia: General  EBL: Minimal  Frozen section: Consistent with metastatic colon cancer  HPI: 54 year old female with colon cancer has presented with a month of headaches nausea and vomiting work-up revealed a large left cerebellar mass as a solitary metastatic lesion.  Due to patient progression of clinical syndrome imaging findings and failed conservative treatment I recommended resection of left cerebellar mass.  I extensively went over the risks and benefits of that procedure with her as well as perioperative course expectations of outcome and alternatives of surgery and she understands and agrees to proceed forward.  Operative procedure: Patient was brought into the OR was induced under general anesthesia positioned prone in pins the backside of her head was shaved prepped and draped in routine sterile fashion.  I then drew out a paramedian incision eccentric to the left and dissected down to the subocciput identified the transverse sinus dissected the subocciput down to the level of the C1 lamina.  I then drilled off the suboccipital with a high-speed drill craniectomy was further expanded with utilizing a 3 Miller Kerrison punch.  I then opened up the dura in a cruciate fashion and cerebellum was noted markedly swollen and hyperemic I then performed a corticectomy and dissected down and immediately identified the capsule of a large mass.  Sent a small specimen for pathology frozen confirmed to be consistent with metastatic colon cancer.  Then I dissected around the plane working in 3 and 6 degree orientation with a 4 Penfield 1 Penfield bipolar electrocautery and patties.  Then set after I got all around the mass then removed it piecemeal and get up underneath the inferior aspect  the capsule and the mass was resected intact.  Then I expected all the margins saw no evidence of abnormal tissue or residual tumor.  Laid Surgicel along the margins after meticulous hemostasis was maintained flap the dura over and placed a large piece of DuraGen and then Gelfoam then closed wound in layers with Vicryl in a running nylon.  Patient is wound was dressed patient recovery in stable condition.  The end the case all needle count sponge counts were correct.

## 2020-05-29 NOTE — Anesthesia Postprocedure Evaluation (Signed)
Anesthesia Post Note  Patient: Shannon [name]  Procedure(s) Performed: Suboccipital Craniectomy for Resection of Left Cerebellar Mass (Left Head)     Patient location during evaluation: PACU Anesthesia Type: General Level of consciousness: awake and alert Pain management: pain level controlled Vital Signs Assessment: post-procedure vital signs reviewed and stable Respiratory status: spontaneous breathing, nonlabored ventilation, respiratory function stable and patient connected to nasal cannula oxygen Cardiovascular status: blood pressure returned to baseline and stable Postop Assessment: no apparent nausea or vomiting Anesthetic complications: no   No complications documented.  Last Vitals:  Vitals:   05/29/20 1345 05/29/20 1403  BP: 131/84 (!) 133/93  Pulse: 88   Resp: 10   Temp: 36.6 C   SpO2: 93%     Last Pain:  Vitals:   05/29/20 1345  TempSrc:   PainSc: 3                  Tryson Lumley P Jurnie Garritano

## 2020-05-29 NOTE — Anesthesia Procedure Notes (Signed)
Procedure Name: Intubation Date/Time: 05/29/2020 9:36 AM Performed by: Griffin Dakin, CRNA Pre-anesthesia Checklist: Patient identified, Emergency Drugs available, Suction available and Patient being monitored Patient Re-evaluated:Patient Re-evaluated prior to induction Oxygen Delivery Method: Circle system utilized Preoxygenation: Pre-oxygenation with 100% oxygen Induction Type: IV induction Ventilation: Mask ventilation without difficulty and Oral airway inserted - appropriate to patient size Laryngoscope Size: Mac and 4 Grade View: Grade II Tube type: Oral Tube size: 7.5 mm Number of attempts: 1 Airway Equipment and Method: Stylet and Oral airway Placement Confirmation: ETT inserted through vocal cords under direct vision,  positive ETCO2 and breath sounds checked- equal and bilateral Secured at: 23 cm Tube secured with: Tape Dental Injury: Teeth and Oropharynx as per pre-operative assessment

## 2020-05-30 ENCOUNTER — Other Ambulatory Visit: Payer: Self-pay | Admitting: *Deleted

## 2020-05-30 ENCOUNTER — Encounter (HOSPITAL_COMMUNITY): Payer: Self-pay | Admitting: Neurosurgery

## 2020-05-30 ENCOUNTER — Inpatient Hospital Stay (HOSPITAL_COMMUNITY): Payer: PRIVATE HEALTH INSURANCE

## 2020-05-30 LAB — BASIC METABOLIC PANEL
Anion gap: 9 (ref 5–15)
BUN: 10 mg/dL (ref 6–20)
CO2: 21 mmol/L — ABNORMAL LOW (ref 22–32)
Calcium: 8.1 mg/dL — ABNORMAL LOW (ref 8.9–10.3)
Chloride: 106 mmol/L (ref 98–111)
Creatinine, Ser: 0.88 mg/dL (ref 0.44–1.00)
GFR, Estimated: >60 mL/min (ref >60)
Glucose, Bld: 218 mg/dL — ABNORMAL HIGH (ref 70–99)
Potassium: 4.3 mmol/L (ref 3.5–5.1)
Sodium: 136 mmol/L (ref 135–145)

## 2020-05-30 LAB — GLUCOSE, CAPILLARY
Glucose-Capillary: 194 mg/dL — ABNORMAL HIGH (ref 70–99)
Glucose-Capillary: 253 mg/dL — ABNORMAL HIGH (ref 70–99)
Glucose-Capillary: 357 mg/dL — ABNORMAL HIGH (ref 70–99)
Glucose-Capillary: 365 mg/dL — ABNORMAL HIGH (ref 70–99)

## 2020-05-30 MED ORDER — GADOBUTROL 1 MMOL/ML IV SOLN
8.0000 mL | Freq: Once | INTRAVENOUS | Status: AC | PRN
  Administered 2020-05-30: 8 mL via INTRAVENOUS

## 2020-05-30 MED ORDER — INSULIN ASPART 100 UNIT/ML ~~LOC~~ SOLN
0.0000 [IU] | Freq: Three times a day (TID) | SUBCUTANEOUS | Status: DC
  Administered 2020-05-30 – 2020-05-31 (×3): 9 [IU] via SUBCUTANEOUS
  Administered 2020-05-31: 5 [IU] via SUBCUTANEOUS

## 2020-05-30 MED ORDER — INSULIN GLARGINE 100 UNIT/ML ~~LOC~~ SOLN
22.0000 [IU] | Freq: Every day | SUBCUTANEOUS | Status: DC
  Administered 2020-05-31 – 2020-06-04 (×5): 22 [IU] via SUBCUTANEOUS
  Filled 2020-05-30 (×6): qty 0.22

## 2020-05-30 NOTE — Evaluation (Signed)
Occupational Therapy Evaluation Patient Details Name: Shannon [name] MRN: [id number] DOB: 09/14/1965 Today's Date: 05/30/2020    History of Present Illness 54 female presenting with a month of headaches nausea vomiting dizziness and ataxia. Work-up showing large left cerebellar mass. 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 PMH including anxiety, anemia, colon cancer (treated with sx and radiation), DM type 2, DVT, and hypothyroidism.   Clinical Impression   PTA, pt was living with her 52 yo daughter and was independent and working from home prior to November 24th; pt with progressive weakness and requiring assistance for all ADLs and using w/c for mobility. Pt's mother has been staying with her the last couple of weeks and plans to stay at dc for support. Pt presenting with decreased cognition, coordination, balance, and strength. Pt requiring Min-Mod A for ADLs and functional mobility with RW. Pt would benefit from further acute OT to facilitate safe dc. Due to pt's high motivation, good support, and change in functional status, recommend dc to CIR for further OT to optimize safety, independence with ADLs, and return to PLOF.     Follow Up Recommendations  CIR    Equipment Recommendations  Other (comment) (Defer to next venue)    Recommendations for Other Services PT consult;Speech consult;Rehab consult     Precautions / Restrictions Precautions Precautions: Fall Precaution Comments: Slightly impulsive and poor awareness      Mobility Bed Mobility Overal bed mobility: Needs Assistance Bed Mobility: Supine to Sit     Supine to sit: Min guard     General bed mobility comments: Min Guard A for safety. Cueing pt to exit bed on right side and pt ecioting bed to left    Transfers Overall transfer level: Needs assistance Equipment used: Rolling walker (2 wheeled) Transfers: Sit to/from Stand Sit to Stand: Min assist         General  transfer comment: Min A for gaining balance in standing    Balance Overall balance assessment: Needs assistance Sitting-balance support: No upper extremity supported;Feet supported Sitting balance-Leahy Scale: Poor Sitting balance - Comments: Min A for balance in donning socks   Standing balance support: Bilateral upper extremity supported;During functional activity;No upper extremity supported Standing balance-Leahy Scale: Poor Standing balance comment: Reliant on UE support and physical assist                           ADL either performed or assessed with clinical judgement   ADL Overall ADL's : Needs assistance/impaired Eating/Feeding: Set up;Sitting   Grooming: Wash/dry hands;Minimal assistance;Standing   Upper Body Bathing: Minimal assistance;Sitting   Lower Body Bathing: Moderate assistance;Sit to/from stand   Upper Body Dressing : Minimal assistance;Sitting   Lower Body Dressing: Minimal assistance;Sit to/from stand Lower Body Dressing Details (indicate cue type and reason): Min A for coorindation in donning socks over toes. Min A for additional support in sitting Toilet Transfer: Minimal assistance;+2 for safety/equipment;Ambulation;Regular Toilet;Grab bars Toilet Transfer Details (indicate cue type and reason): Min A for safety Toileting- Clothing Manipulation and Hygiene: Min guard;Sitting/lateral lean       Functional mobility during ADLs: Minimal assistance;Moderate assistance;Rolling walker General ADL Comments: Pt presenting with decreased coorination, cognition, nad balance imapcting her safe performance of ADLs     Vision Baseline Vision/History: Wears glasses Wears Glasses: At all times Patient Visual Report: No change from baseline       Perception     Praxis  Pertinent Vitals/Pain Pain Assessment: Faces Pain Score: 9  Faces Pain Scale: Hurts a little bit Pain Location: Neck Pain Descriptors / Indicators: Discomfort Pain  Intervention(s): Monitored during session;Repositioned     Hand Dominance Right   Extremity/Trunk Assessment Upper Extremity Assessment Upper Extremity Assessment: Generalized weakness (Reports decreased fine motor)   Lower Extremity Assessment Lower Extremity Assessment: Defer to PT evaluation   Cervical / Trunk Assessment Cervical / Trunk Assessment: Other exceptions Cervical / Trunk Exceptions: incension at back of neck   Communication Communication Communication: No difficulties   Cognition Arousal/Alertness: Awake/alert Behavior During Therapy: Impulsive Overall Cognitive Status: Impaired/Different from baseline Area of Impairment: Attention;Memory;Following commands;Safety/judgement;Awareness;Problem solving;Orientation                 Orientation Level: Disoriented to;Time Current Attention Level: Sustained Memory: Decreased short-term memory Following Commands: Follows one step commands with increased time;Follows multi-step commands inconsistently;Follows multi-step commands with increased time Safety/Judgement: Decreased awareness of safety;Decreased awareness of deficits Awareness: Emergent;Intellectual Problem Solving: Slow processing;Difficulty sequencing;Requires verbal cues General Comments: Pt very agreeable to therapy and motivated. Decreased awareness and memory. inconsistent information provided when collecting home information and PLOF. Also presenting wiht impulsivity and poor safety awareness.   General Comments  VSS on RA    Exercises     Shoulder Instructions      Home Living Family/patient expects to be discharged to:: Private residence Living Arrangements: Parent;Children (daughter - 58 yo) Available Help at Discharge: Family;Available 24 hours/day Type of Home: House Home Access: Stairs to enter;Level entry Entrance Stairs-Number of Steps: 5-6 at front; and 2-3 steps in back   Home Layout: One level     Bathroom Shower/Tub: Walk-in  shower;Tub/shower unit   [employer]: Standard     Home Equipment: Shower seat - built in;Hand held shower head;Wheelchair - Economist - 2 wheels   Additional Comments: mother been staying past few weeks. two falls since decline in Nov      Prior Functioning/Environment Level of Independence: Needs assistance  Gait / Transfers Assistance Needed: Use of w/c ADL's / Homemaking Assistance Needed: Performing BADLs   Comments: Recently decline since Nov 24 where she couldnt get around without help. 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)        OT Problem List: Decreased strength;Decreased activity tolerance;Decreased range of motion;Impaired balance (sitting and/or standing);Decreased safety awareness;Decreased cognition;Decreased coordination;Decreased knowledge of precautions;Decreased knowledge of use of DME or AE;Pain      OT Treatment/Interventions: Self-care/ADL training;Therapeutic exercise;Energy conservation;DME and/or AE instruction;Therapeutic activities;Patient/family education    OT Goals(Current goals can be found in the care plan section) Acute Rehab OT Goals Patient Stated Goal: Go home OT Goal Formulation: With patient Time For Goal Achievement: 06/13/20 Potential to Achieve Goals: Good  OT Frequency: Min 3X/week   Barriers to D/C:            Co-evaluation PT/OT/SLP Co-Evaluation/Treatment: Yes Reason for Co-Treatment: For patient/therapist safety;To address functional/ADL transfers   OT goals addressed during session: ADL's and self-care      AM-PAC OT "6 Clicks" Daily Activity     Outcome Measure Help from another person eating meals?: A Little Help from another person taking care of personal grooming?: A Little Help from another person toileting, which includes using toliet, bedpan, or urinal?: A Lot Help from another person bathing (including washing, rinsing, drying)?: A Lot Help from another person to put on  and taking off regular upper body clothing?: A Little Help from another person to  put on and taking off regular lower body clothing?: A Lot 6 Click Score: 15   End of Session Equipment Utilized During Treatment: Rolling walker;Gait belt Nurse Communication: Mobility status  Activity Tolerance: Patient tolerated treatment well Patient left: in chair;with call bell/phone within reach;with chair alarm set  OT Visit Diagnosis: Unsteadiness on feet (R26.81);Other abnormalities of gait and mobility (R26.89);Muscle weakness (generalized) (M62.81);Pain Pain - part of body:  (Neck)                Time: [id number] OT Time Calculation (min): 18 min Charges:  OT General Charges $OT Visit: 1 Visit OT Evaluation $OT Eval Moderate Complexity: [place], OTR/L Acute Rehab Pager: [phone] Office: [place] 05/30/2020, 3:07 PM

## 2020-05-30 NOTE — Progress Notes (Signed)
Inpatient Diabetes Program Recommendations  AACE/ADA: New Consensus Statement on Inpatient Glycemic Control (2015)  Target Ranges:  Prepandial:   less than 140 mg/dL      Peak postprandial:   less than 180 mg/dL (1-2 hours)      Critically ill patients:  140 - 180 mg/dL   Lab Results  Component Value Date   GLUCAP 194 (H) 05/30/2020  Results for SAPNA, PADRON (MRN [id number]) as of 05/30/2020 11:36  Ref. Range 05/29/2020 08:05 05/29/2020 12:47 05/29/2020 21:38 05/30/2020 08:11  Glucose-Capillary Latest Ref Range: 70 - 99 mg/dL 176 (H) 160 (H) 737 (H) 194 (H)    Review of Glycemic Control  Diabetes history:  Outpatient Diabetes medications: Lantus 45 units every am, Novolog 0-12 units sliding scale TID, Metformin 500 mg-1000 mg BID Current orders for Inpatient glycemic control: none  Inpatient Diabetes Program Recommendations:   Recommend starting Novolog SENSITIVE correction scale TID if blood sugars are greater than 180 mg/dl.   Will continue to monitor blood sugars while in the hospital.  Smith Mince RN BSN CDE Diabetes Coordinator Pager: [phone]  8am-5pm

## 2020-05-30 NOTE — Progress Notes (Addendum)
Rehab Admissions Coordinator Note:  Per PT and OT recommendation, this patient was screened by Cheri Rous for appropriateness for an Inpatient Acute Rehab Consult.  At this time, we are recommending Inpatient Rehab consult.  Please have attending service place consult order in order for this patient to be further assessed for CIR.   Cheri Rous 05/30/2020, 5:15 PM  I can be reached at [phone].

## 2020-05-30 NOTE — Progress Notes (Signed)
Subjective: Patient reports Doing fairly well condition neck pain but no significant headache improved nausea denies any numbness tingling face arms or legs  Objective: Vital signs in last 24 hours: Temp:  [97.6 F (36.4 C)-98.4 F (36.9 C)] 98.4 F (36.9 C) (12/30 0400) Pulse Rate:  [82-101] 84 (12/30 0700) Resp:  [7-20] 9 (12/30 0700) BP: (110-148)/(65-93) 110/77 (12/30 0700) SpO2:  [93 %-99 %] 94 % (12/30 0700) Arterial Line BP: (89-194)/(64-98) 89/85 (12/30 0700) Weight:  [93.4 kg] 93.4 kg (12/29 0806)  Intake/Output from previous day: 12/29 0701 - 12/30 0700 In: 2739.4 [P.O.:250; I.V.:2089.5; IV Piggyback:399.9] Out: 2660 [Urine:2460; Blood:200] Intake/Output this shift: No intake/output data recorded.  Awake alert oriented pupils equal extract movements are intact strength 5-5 incision clean dry and intact  Lab Results: Recent Labs    05/29/20 1028 05/29/20 1600  WBC  --  7.6  HGB 12.2 12.0  HCT 36.0 35.0*  PLT  --  120*   BMET Recent Labs    05/29/20 1600 05/30/20 0500  NA 138 136  K 4.5 4.3  CL 106 106  CO2 24 21*  GLUCOSE 195* 218*  BUN 11 10  CREATININE 1.00 0.88  CALCIUM 8.1* 8.1*    Studies/Results: No results found.  Assessment/Plan: Postop day 1 suboccipital craniotomy for evacuation of left cerebellar mass MRI this morning is pending mobilize with physical and Occupational Therapy possible transfer to progressive later on this afternoon or tomorrow slow Decadron wean  LOS: 1 day     Shannon [name] 05/30/2020, 7:17 AM

## 2020-05-30 NOTE — Evaluation (Signed)
Physical Therapy Evaluation Patient Details Name: Shannon [name] MRN: [id number] DOB: 03-22-1966 Today's Date: 05/30/2020   History of Present Illness  54 female presenting with a month of headaches nausea vomiting dizziness and ataxia. Work-up showing large left cerebellar mass. 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 PMH including anxiety, anemia, colon cancer (treated with sx and radiation), DM type 2, DVT, and hypothyroidism.  Clinical Impression   Pt in bed upon arrival of PT, agreeable to evaluation at this time. Prior to admission the pt was using WC for mobility in her home with assist for transfers and ADLs. The pt reports she has been experiencing a physical decline since for last month, has required assist and had multiple falls since then. The pt now presents with limitations in functional mobility, strength, stability, safety awareness, coordination, and awareness due to above dx, and will continue to benefit from skilled PT to address these deficits. The pt was able to complete bed mobility without assist, but moves without awareness or attention paid to obstacles or lines, needing significant assist to maintain lines. The pt was then able to complete OOB transfers and short bouts of gait in the room with use of RW and min/modA to steady due to poor standing balance, coordination, strong posterior lean, and frequency of scissoring steps with gait. The pt will benefit from significant therapies to progress functional strength, stability, coordination, and safety awareness to facilitate return to prior level of indepedence and mobility.      Follow Up Recommendations CIR    Equipment Recommendations   (defer to post acute)    Recommendations for Other Services Rehab consult     Precautions / Restrictions Precautions Precautions: Fall Precaution Comments: Slightly impulsive and poor awareness Restrictions Weight Bearing Restrictions: No       Mobility  Bed Mobility Overal bed mobility: Needs Assistance Bed Mobility: Supine to Sit     Supine to sit: Min guard     General bed mobility comments: Min Guard A for safety. Cueing pt to exit bed on right side and pt ecioting bed to left    Transfers Overall transfer level: Needs assistance Equipment used: Rolling walker (2 wheeled) Transfers: Sit to/from Stand Sit to Stand: Min assist         General transfer comment: minA to steady in stance, posterior lean with poor insight to instability  Ambulation/Gait Ambulation/Gait assistance: Min assist;Mod assist Gait Distance (Feet): 30 Feet Assistive device: Rolling walker (2 wheeled) Gait Pattern/deviations: Step-through pattern;Decreased stride length;Scissoring;Ataxic;Leaning posteriorly;Narrow base of support Gait velocity: decreased   General Gait Details: slow gait with narrow steps, intermittent scissoring steps with strong posterior lean and minimal self-correction. minA to steady with moments of modA needed due to posterior lean or instability      Balance Overall balance assessment: Needs assistance Sitting-balance support: No upper extremity supported;Feet supported Sitting balance-Leahy Scale: Poor Sitting balance - Comments: Min A for balance in donning socks   Standing balance support: Bilateral upper extremity supported;During functional activity;No upper extremity supported Standing balance-Leahy Scale: Poor Standing balance comment: Reliant on UE support and physical assist                             Pertinent Vitals/Pain Pain Assessment: Faces Pain Score: 9  Faces Pain Scale: Hurts a little bit Pain Location: Neck Pain Descriptors / Indicators: Discomfort Pain Intervention(s): Monitored during session;Repositioned    Home Living Family/patient expects to  be discharged to:: Private residence Living Arrangements: Parent;Children (mother and 24 yo daughter) Available Help at  Discharge: Family;Available 24 hours/day Type of Home: House Home Access: Stairs to enter;Level entry   Entrance Stairs-Number of Steps: 5-6 at front; and 2-3 steps in back Home Layout: One level Home Equipment: Shower seat - built in;Hand held shower head;Wheelchair - Insurance claims handler - 2 wheels Additional Comments: mother been staying past few weeks. two falls since decline in Nov    Prior Function Level of Independence: Needs assistance   Gait / Transfers Assistance Needed: Use of w/c  ADL's / Homemaking Assistance Needed: Performing BADLs  Comments: Recently decline since Nov 24 where she couldnt get around without help. 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)     Hand Dominance   Dominant Hand: Right    Extremity/Trunk Assessment   Upper Extremity Assessment Upper Extremity Assessment: Defer to OT evaluation    Lower Extremity Assessment Lower Extremity Assessment: Generalized weakness;RLE deficits/detail;LLE deficits/detail RLE Coordination: decreased fine motor;decreased gross motor LLE Coordination: decreased fine motor;decreased gross motor    Cervical / Trunk Assessment Cervical / Trunk Assessment: Other exceptions Cervical / Trunk Exceptions: incension at back of neck  Communication   Communication: No difficulties  Cognition Arousal/Alertness: Awake/alert Behavior During Therapy: Impulsive Overall Cognitive Status: Impaired/Different from baseline Area of Impairment: Attention;Memory;Following commands;Safety/judgement;Awareness;Problem solving;Orientation                 Orientation Level: Disoriented to;Time Current Attention Level: Sustained Memory: Decreased short-term memory Following Commands: Follows one step commands with increased time;Follows multi-step commands inconsistently;Follows multi-step commands with increased time Safety/Judgement: Decreased awareness of safety;Decreased awareness of  deficits Awareness: Emergent;Intellectual Problem Solving: Slow processing;Difficulty sequencing;Requires verbal cues General Comments: Pt very agreeable to therapy and motivated. Decreased awareness and memory. inconsistent information provided when collecting home information and PLOF. Also presenting wiht impulsivity and poor safety awareness.      General Comments General comments (skin integrity, edema, etc.): VSS on RA    Exercises     Assessment/Plan    PT Assessment Patient needs continued PT services  PT Problem List Decreased strength;Decreased balance;Decreased activity tolerance;Decreased mobility;Decreased coordination;Decreased cognition;Decreased safety awareness       PT Treatment Interventions DME instruction;Gait training;Stair training;Functional mobility training;Therapeutic activities;Therapeutic exercise;Balance training;Neuromuscular re-education;Patient/family education    PT Goals (Current goals can be found in the Care Plan section)  Acute Rehab PT Goals Patient Stated Goal: Go home PT Goal Formulation: With patient Time For Goal Achievement: 06/13/20 Potential to Achieve Goals: Good    Frequency Min 4X/week   Barriers to discharge        Co-evaluation PT/OT/SLP Co-Evaluation/Treatment: Yes Reason for Co-Treatment: To address functional/ADL transfers;For patient/therapist safety;Necessary to address cognition/behavior during functional activity PT goals addressed during session: Mobility/safety with mobility;Balance OT goals addressed during session: ADL's and self-care       AM-PAC PT "6 Clicks" Mobility  Outcome Measure Help needed turning from your back to your side while in a flat bed without using bedrails?: A Little Help needed moving from lying on your back to sitting on the side of a flat bed without using bedrails?: A Little Help needed moving to and from a bed to a chair (including a wheelchair)?: A Little Help needed standing up from  a chair using your arms (e.g., wheelchair or bedside chair)?: A Little Help needed to walk in hospital room?: A Lot Help needed climbing 3-5 steps with a railing? : A Lot 6 Click  Score: 16    End of Session Equipment Utilized During Treatment: Gait belt Activity Tolerance: Patient tolerated treatment well Patient left: in chair;with call bell/phone within reach;with chair alarm set Nurse Communication: Mobility status (pt impulsivity and poor cog) PT Visit Diagnosis: Unsteadiness on feet (R26.81);Other abnormalities of gait and mobility (R26.89);Muscle weakness (generalized) (M62.81);Repeated falls (R29.6)    Time: [place]:[id number] PT Time Calculation (min) (ACUTE ONLY): 28 min   Charges:   PT Evaluation $PT Eval Moderate Complexity: 1 Mod         Karma Ganja, PT, DPT   Acute Rehabilitation Department Pager #: [phone]  Otho Bellows 05/30/2020, 4:32 PM

## 2020-05-31 DIAGNOSIS — C7931 Secondary malignant neoplasm of brain: Principal | ICD-10-CM

## 2020-05-31 LAB — GLUCOSE, CAPILLARY
Glucose-Capillary: 271 mg/dL — ABNORMAL HIGH (ref 70–99)
Glucose-Capillary: 351 mg/dL — ABNORMAL HIGH (ref 70–99)
Glucose-Capillary: 376 mg/dL — ABNORMAL HIGH (ref 70–99)
Glucose-Capillary: 348 mg/dL — ABNORMAL HIGH (ref 70–99)

## 2020-05-31 LAB — HEMOGLOBIN A1C
Hgb A1c MFr Bld: 9.6 % — ABNORMAL HIGH (ref 4.8–5.6)
Mean Plasma Glucose: 228.82 mg/dL

## 2020-05-31 MED ORDER — INSULIN ASPART 100 UNIT/ML ~~LOC~~ SOLN
0.0000 [IU] | Freq: Three times a day (TID) | SUBCUTANEOUS | Status: DC
  Administered 2020-06-01: 11 [IU] via SUBCUTANEOUS
  Administered 2020-06-01: 3 [IU] via SUBCUTANEOUS
  Administered 2020-06-01: 15 [IU] via SUBCUTANEOUS

## 2020-05-31 NOTE — Progress Notes (Signed)
Transferred to 2G40 with all belongings. Report given to [facility]. Patient contacted mother with new room #.

## 2020-05-31 NOTE — Progress Notes (Signed)
   Providing Compassionate, Quality Care - Together  NEUROSURGERY PROGRESS NOTE   S: No issues overnight.  Minimal headache and incisional pain  O: EXAM:  BP (!) 97/50   Pulse 73   Temp 98.1 F (36.7 C) (Oral)   Resp 10   Ht 5\' 8"  (1.727 m)   Wt 93.4 kg   LMP 02/22/2019 (Approximate)   SpO2 98%   BMI 31.32 kg/m   Awake, alert, oriented  Speech fluent, appropriate  PERRLA No dysmetria bilateral upper extremities Ataxic gait CNs grossly intact  5/5 BUE/BLE  Incision clean dry and intact  ASSESSMENT:  54 y.o. female with  Left cerebellar intra-axial lesion, likely metastatic  Status post left suboccipital craniotomy, resection of tumor  PLAN: -PT/OT, recommend CIR -Pain control -Stepdown to floor status -Overall doing very well    Thank you for allowing me to participate in this patient's care.  Please do not hesitate to call with questions or concerns.   46, DO Neurosurgeon [facility] Cell: [phone]

## 2020-05-31 NOTE — Consult Note (Addendum)
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 Reatha Armour   HPI: Shannon [name] is a 54 y.o. female with history of T2DM, DVT, colon cancer one month history of HA, N/V with dizziness, diplopia, multiple falls with work up revealing large left cerebellar mass. She was admitted on 05/29/20 for suboccipital crani for tumor resection by Dr. Saintclair Halsted. Post op therapy evaluations revealed weakness, ataxia,scissoring gait, lacks safety awareness with affect her mobility and ability to perform ADLs.  She was independent prior a month ago and mother has been assisting with ADLs with WC being used for mobility. Physical Medicine & Rehabilitation was consulted to assess candidacy for CIR given impaired cognition and mobility. Currently has blurry vision and shortness of breath.    Review of Systems  Constitutional: Negative for chills and fever.  HENT: Negative for hearing loss and tinnitus.   Eyes: Positive for blurred vision (on left) and double vision. Negative for photophobia.  Respiratory: Positive for shortness of breath (with minimal activity).   Cardiovascular: Negative for chest pain, palpitations and leg swelling.  Gastrointestinal: Negative for heartburn and nausea.  Musculoskeletal: Negative for joint pain and neck pain.  Skin: Negative for rash.  Neurological: Positive for dizziness (worse in the past month), tingling (bilateral feet due to diabetes), weakness and headaches.  Psychiatric/Behavioral: Positive for depression. The patient is nervous/anxious and has insomnia.      Past Medical History:  Diagnosis Date  . Anemia    Iron De  . Anxiety   . Blood clot in vein   . Bowel obstruction ([place])   . Colon cancer ([place]) 2018   treated with surgery and radiation  . Depression   . Diabetes mellitus without complication ([place])    Type II  . DVT (deep venous thrombosis) ([place]) 2019   behind  right knee- while she wasa on chemo  . GERD (gastroesophageal reflux disease)   . History of blood transfusion   . History of chemotherapy   . History of kidney stones 2012   passed  . Hypothyroidism   . Obesity     Past Surgical History:  Procedure Laterality Date  . ABDOMINAL HYSTERECTOMY  05/2019   total laparoscopin with tubes and ovariers removed also.  . APPENDECTOMY  05/2019  . COLON SURGERY  03/2017   Colon resection  . COLONOSCOPY  2019  . SUBOCCIPITAL CRANIECTOMY CERVICAL LAMINECTOMY Left 05/29/2020   Procedure: Suboccipital Craniectomy for Resection of Left Cerebellar Mass;  Surgeon: Kary Kos, MD;  Location: [facility];  Service: Neurosurgery;  Laterality: Left;    Family History  Problem Relation Age of Onset  . Irritable bowel syndrome Mother   . Colon polyps Father   . Breast cancer Maternal Grandmother   . Diabetes Maternal Grandmother   . Diabetes Maternal Grandfather   . Breast cancer Paternal Grandmother   . Colon polyps Maternal Uncle   . Stomach cancer Neg Hx   . Pancreatic cancer Neg Hx   . Esophageal cancer Neg Hx   . Colon cancer Neg Hx     Social History: Lives with 71 year old daughter. 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  Mother helps out 3 days a week to help with home/meals? Has been WC bound for past 3-4 weeks with mother assisting. She reports that she has never smoked. She has never used  smokeless tobacco. She reports current alcohol use. She reports that she does not use drugs.   Allergies  Allergen Reactions  . Clindamycin/Lincomycin Rash  . Penicillins Rash    Reaction: 10 years    Medications Prior to Admission  Medication Sig Dispense Refill  . acetaminophen (TYLENOL) 500 MG tablet Take 500-1,000 mg by mouth every 6 (six) hours as needed for moderate pain or mild pain.    Marland Kitchen atorvastatin (LIPITOR) 10 MG tablet Take 10 mg by mouth at bedtime.  0  . diazepam (VALIUM)  2 MG tablet Take 2 mg by mouth 3 (three) times daily as needed.    . Dulaglutide 1.5 MG/0.5ML SOPN Inject 1.5 mg into the skin once a week. Monday    . fluvoxaMINE (LUVOX) 100 MG tablet Take 3 tablets (300 mg total) by mouth at bedtime. 270 tablet 1  . hyoscyamine (LEVSIN SL) 0.125 MG SL tablet Place 1 tablet (0.125 mg total) under the tongue every 6 (six) hours as needed. 45 tablet 1  . insulin aspart (NOVOLOG) 100 UNIT/ML FlexPen Inject 0-12 Units into the skin 3 (three) times daily with meals. Sliding Scale    . lamoTRIgine (LAMICTAL) 150 MG tablet Take 1 tablet (150 mg total) by mouth daily. (Patient taking differently: No sig reported) 90 tablet 3  . LANTUS SOLOSTAR 100 UNIT/ML Solostar Pen Inject 45 Units into the skin daily. Every AM    . levothyroxine (SYNTHROID) 75 MCG tablet Take 75 mcg by mouth daily.    Marland Kitchen LORazepam (ATIVAN) 0.5 MG tablet Take 0.5 mg by mouth every 6 (six) hours as needed for anxiety. .5mg  or 1mg     . meclizine (ANTIVERT) 25 MG tablet Take 25 mg by mouth daily as needed for dizziness.    . metFORMIN (GLUCOPHAGE) 500 MG tablet Take 500-1,000 mg by mouth See admin instructions. 500 mg in the AM 1000 mg tablets in the PM    . ondansetron (ZOFRAN) 4 MG tablet Take 1 tablet (4 mg total) by mouth every 8 (eight) hours as needed for nausea or vomiting. 30 tablet 1  . promethazine (PHENERGAN) 25 MG tablet Take 25 mg by mouth 2 (two) times daily as needed for nausea/vomiting.    . SEROQUEL 400 MG tablet TAKE 2 TABLETS (800 MG TOTAL) BY MOUTH AT BEDTIME. BRAND MEDICALLY NECESSARY 180 tablet 5  . dexamethasone (DECADRON) 4 MG tablet Take 1 tablet (4 mg total) by mouth 3 (three) times daily. 90 tablet 0  . metroNIDAZOLE (FLAGYL) 250 MG tablet Take 1 tablet (250 mg total) by mouth 3 (three) times daily. (Patient not taking: No sig reported) 42 tablet 0    Home: Home Living Family/patient expects to be discharged to:: Private residence Living Arrangements: Parent,Children (mother  and 44 yo daughter) Available Help at Discharge: Family,Available 24 hours/day Type of Home: House Home Access: Stairs to enter,Level entry Entrance Stairs-Number of Steps: 5-6 at front; and 2-3 steps in back Home Layout: One level Bathroom Shower/Tub: Walk-in Armed forces operational officer: Standard Home Equipment: Civil engineer, contracting - built in,Hand held shower head,Wheelchair - Administrator, arts - 2 wheels Additional Comments: mother been staying past few weeks. two falls since decline in Nov  Functional History: Prior Function Level of Independence: Needs assistance Gait / Transfers Assistance Needed: Use of w/c ADL's / Homemaking Assistance Needed: Performing BADLs Comments: Recently decline since Nov 24 where she couldnt get around without help. 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 Cueing pt to exit bed on right side and pt ecioting bed to left Transfers Overall transfer level: Needs assistance Equipment used: Rolling walker (2 wheeled) Transfers: Sit to/from Stand Sit to Stand: Min assist General transfer comment: minA to steady in stance, posterior lean with poor insight to instability Ambulation/Gait Ambulation/Gait assistance: Min assist,Mod assist Gait Distance (Feet): 30 Feet Assistive device: Rolling walker (2 wheeled) Gait Pattern/deviations: Step-through pattern,Decreased stride length,Scissoring,Ataxic,Leaning posteriorly,Narrow base of support General Gait Details: slow gait with narrow steps, intermittent scissoring steps with strong posterior lean and minimal self-correction. minA to steady with moments of modA needed due to posterior lean or instability Gait velocity: decreased    ADL: ADL Overall ADL's : Needs  assistance/impaired Eating/Feeding: Set up,Sitting Grooming: Wash/dry hands,Minimal assistance,Standing Upper Body Bathing: Minimal assistance,Sitting Lower Body Bathing: Moderate assistance,Sit to/from stand Upper Body Dressing : Minimal assistance,Sitting Lower Body Dressing: Minimal assistance,Sit to/from stand Lower Body Dressing Details (indicate cue type and reason): Min A for coorindation in donning socks over toes. Min A for additional support in sitting Toilet Transfer: Minimal assistance,+2 for safety/equipment,Ambulation,Regular Toilet,Grab bars Toilet Transfer Details (indicate cue type and reason): Min A for safety Toileting- Clothing Manipulation and Hygiene: Min guard,Sitting/lateral lean Functional mobility during ADLs: Minimal assistance,Moderate assistance,Rolling walker General ADL Comments: Pt presenting with decreased coorination, cognition, nad balance imapcting her safe performance of ADLs  Cognition: Cognition Overall Cognitive Status: Impaired/Different from baseline Orientation Level: Oriented X4 Cognition Arousal/Alertness: Awake/alert Behavior During Therapy: Impulsive Overall Cognitive Status: Impaired/Different from baseline Area of Impairment: Attention,Memory,Following commands,Safety/judgement,Awareness,Problem solving,Orientation Orientation Level: Disoriented to,Time Current Attention Level: Sustained Memory: Decreased short-term memory Following Commands: Follows one step commands with increased time,Follows multi-step commands inconsistently,Follows multi-step commands with increased time Safety/Judgement: Decreased awareness of safety,Decreased awareness of deficits Awareness: Emergent,Intellectual Problem Solving: Slow processing,Difficulty sequencing,Requires verbal cues General Comments: Pt very agreeable to therapy and motivated. Decreased awareness and memory. inconsistent information provided when collecting home information and PLOF. Also  presenting wiht impulsivity and poor safety awareness.   Blood pressure (!) 124/59, pulse 73, temperature 98.4 F (36.9 C), temperature source Oral, resp. rate 10, height 5\' 8"  (1.727 m), weight 93.4 kg, last menstrual period 02/22/2019, SpO2 95 %. Physical Exam General: Alert, No apparent distress HEENT: Head is normocephalic, Posterior neck incision with honeycomb dressing.  Neck: Supple without JVD or lymphadenopathy Heart: Reg rate and rhythm. No murmurs rubs or gallops Chest: CTA bilaterally without wheezes, rales, or rhonchi; no distress Abdomen: Soft, non-tender, non-distended, bowel sounds positive. Extremities: No clubbing, cyanosis, or edema. Pulses are 2+ Skin: Clean and intact without signs of breakdown Neuro: Speech slow but clear. She reports blurred vision on the left "bad eye" with diplopia. Tends to close left eye for finger to nose. Was able to ambulate with NT to BR but developed presyncope, posterior bias and vertigo requiring 2+ assist. 5/5 strength throughout. Psych: Pt's affect is appropriate. Pt is cooperative   Results for orders placed or performed during the hospital encounter of 05/29/20 (from the past 24 hour(s))  Glucose, capillary     Status: Abnormal   Collection Time: 05/30/20 11:43 AM  Result Value Ref Range   Glucose-Capillary 253 (H) 70 - 99 mg/dL  Glucose, capillary     Status: Abnormal   Collection Time: 05/30/20  4:10 PM  Result Value Ref Range   Glucose-Capillary 357 (H) 70 - 99  mg/dL  Glucose, capillary     Status: Abnormal   Collection Time: 05/30/20 10:00 PM  Result Value Ref Range   Glucose-Capillary 365 (H) 70 - 99 mg/dL  Glucose, capillary     Status: Abnormal   Collection Time: 05/31/20  7:17 AM  Result Value Ref Range   Glucose-Capillary 271 (H) 70 - 99 mg/dL   MR BRAIN W WO CONTRAST  Result Date: 05/30/2020 CLINICAL DATA:  Brain mass or lesion. Status post craniotomy for brain mass. EXAM: MRI HEAD WITHOUT AND WITH CONTRAST  TECHNIQUE: Multiplanar, multiecho pulse sequences of the brain and surrounding structures were obtained without and with intravenous contrast. CONTRAST:  81mL GADAVIST GADOBUTROL 1 MMOL/ML IV SOLN COMPARISON:  MRI May 27, 2020. FINDINGS: Brain: Interval resection of the previously described left cerebellar mass. Evaluation is limited by the iron throughout the left cerebellum from prior Feraheme injection. Post-contrast is imaging is particularly limited within these limitations, no specific evidence of residual tumor. T2 hypointense area in the anterior aspect of the resection cavity is favored to represent an air bubble. There is expected blood products within the resection cavity and expected devitalized tissue. Redemonstrated left cerebellar edema with T1 hyperintensity from Feraheme staining. Decreased mass effect and decreased effacement of the fourth ventricle. The ventricular system remains mildly dilated, but this is slightly improved from the prior. Mild periventricular edema. No midline shift. Basal cisterns are patent. Vascular: Major arterial flow voids are maintained at the skull base. Skull and upper cervical spine: Left occipital craniectomy. Heterogeneous marrow signal without evidence of focal marrow replacing lesion. Sinuses/Orbits: Mild paranasal sinus mucosal thickening without air-fluid levels. Unremarkable orbits. Other: Trace bilateral mastoid effusions. IMPRESSION: 1. 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 Evaluation is limited by iron throughout the left cerebellum from prior Feraheme injection. Post-contrast is imaging is particularly limited. Within these limitations, no specific evidence of residual tumor. T2 hypointense area in the anterior aspect of the resection cavity is favored to represent an air bubble, but warrants attention on follow. 2. Redemonstrated left cerebellar edema with decreased mass effect and decreased effacement of the fourth ventricle. The ventricular  system remains dilated, but this is slightly improved from the prior. Similar mild periventricular edema. Electronically Signed   By: Feliberto Harts MD   On: 05/30/2020 10:38     Assessment/Plan: Diagnosis: Left cerebellar intra-axial lesion, likely metastatic 1. Does the need for close, 24 hr/day medical supervision in concert with the patient's rehab needs make it unreasonable for this patient to be served in a less intensive setting? Yes 2. Co-Morbidities requiring supervision/potential complications: 1. Headache- consider addition of Topamax 14md daily. 2. Incisional pain: consider transitioning from IV to po dilaudid.  3. Ataxic gait: will require intensive PT and OT 4. Impaired safety awareness: will require intensive SLP 3. Due to bladder management, bowel management, safety, skin/wound care, disease management, medication administration, pain management and patient education, does the patient require 24 hr/day rehab nursing? Yes 4. Does the patient require coordinated care of a physician, rehab nurse, therapy disciplines of PT, OT, SLP to address physical and functional deficits in the context of the above medical diagnosis(es)? Yes Addressing deficits in the following areas: balance, endurance, locomotion, strength, transferring, bowel/bladder control, bathing, dressing, feeding, grooming, toileting, cognition and psychosocial support 5. Can the patient actively participate in an intensive therapy program of at least 3 hrs of therapy per day at least 5 days per week? Yes 6. The potential for patient to make measurable gains while  on inpatient rehab is excellent 7. Anticipated functional outcomes upon discharge from inpatient rehab are supervision  with PT, supervision with OT, supervision with SLP. 8. Estimated rehab length of stay to reach the above functional goals is: 7-10 days 9. Anticipated discharge destination: Home 10. Overall Rehab/Functional Prognosis:  excellent  RECOMMENDATIONS: This patient's condition is appropriate for continued rehabilitative care in the following setting: CIR Patient has agreed to participate in recommended program. Yes Note that insurance prior authorization may be required for reimbursement for recommended care.  Comment: Thank you for this consult. Admission coordinator to follow.   I have personally performed a face to face diagnostic evaluation, including, but not limited to relevant history and physical exam findings, of this patient and developed relevant assessment and plan.  Additionally, I have reviewed and concur with the physician assistant's documentation above.  Leeroy Cha, MD  Bary Leriche, PA-C 05/31/2020

## 2020-05-31 NOTE — Progress Notes (Signed)
Physical Therapy Treatment Patient Details Name: Shannon [name] MRN: [id number] DOB: June 22, 1965 Today's Date: 05/31/2020    History of Present Illness 51 female presenting with a month of headaches nausea vomiting dizziness and ataxia. Work-up showing large left cerebellar mass. 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 PMH including anxiety, anemia, colon cancer (treated with sx and radiation), DM type 2, DVT, and hypothyroidism.    PT Comments    The pt was eager and willing to participate in PT session with focus on progression of OOB stability and activity tolerance. However, the session was limited by orthostatic hypotension with all OOB activity despite attempts at seated exercise and activity to maintain BP. The pt's BP stabilized with return to seated position, where she was able to complete a series of LE exercises and movements against resistance. The pt required minA to complete all sit-stand transfers and gait at this time due to posterior lean and instability in standing as well as for safety given reports of dizziness. The pt will continue to benefit from skilled PT to progress endurance, tolerance for upright activity, and dynamic stability.   VITALS:  - supine - BP: 126/76 (92) - sitting EOB - BP: 102/71 (82) - sitting post-exercises: 106/77 (87) - standing - BP: 82/61 (68) - sitting post stand- BP: 102/75 (83)      Follow Up Recommendations  CIR     Equipment Recommendations   (defer to post acute)    Recommendations for Other Services       Precautions / Restrictions Precautions Precautions: Fall Precaution Comments: Slightly impulsive and poor awareness Restrictions Weight Bearing Restrictions: No    Mobility  Bed Mobility Overal bed mobility: Needs Assistance Bed Mobility: Supine to Sit     Supine to sit: Min guard     General bed mobility comments: minG for safety, pt followed cues to wait for PT to lower  bed rails this time, minA for line management  Transfers Overall transfer level: Needs assistance Equipment used: Rolling walker (2 wheeled) Transfers: Sit to/from Stand Sit to Stand: Min assist         General transfer comment: minA to steady and assist with RW management, but decreased posterior lean from initial eval.  Ambulation/Gait Ambulation/Gait assistance: Min assist Gait Distance (Feet): 3 Feet Assistive device: Rolling walker (2 wheeled) Gait Pattern/deviations: Step-to pattern;Decreased stride length;Narrow base of support Gait velocity: decreased   General Gait Details: small lateral steps with narrow BOS, pt with less posterior lean, but less distance on gait today due to orthostatic upon standing       Balance Overall balance assessment: Needs assistance Sitting-balance support: No upper extremity supported;Feet supported Sitting balance-Leahy Scale: Poor Sitting balance - Comments: Min A for balance in donning socks   Standing balance support: Bilateral upper extremity supported;During functional activity;No upper extremity supported Standing balance-Leahy Scale: Poor Standing balance comment: Reliant on UE support and physical assist                            Cognition Arousal/Alertness: Awake/alert Behavior During Therapy: WFL for tasks assessed/performed (mild impulsivity) Overall Cognitive Status: Impaired/Different from baseline Area of Impairment: Memory;Safety/judgement;Awareness;Problem solving                     Memory: Decreased short-term memory Following Commands: Follows one step commands consistently Safety/Judgement: Decreased awareness of safety;Decreased awareness of deficits Awareness: Emergent Problem Solving: Slow processing;Difficulty sequencing;Requires verbal  cues General Comments: Pt agreeable to therapy and motivated. Good recall of past therapy session and mobility in the morning. Pt able to verbalize needs  at this time, improved fluidity and accuracy of answers this session compared to eval      Exercises General Exercises - Lower Extremity Long Arc Quad: AROM;Both;10 reps;Seated (against min resistance) Heel Slides: AROM;Both;10 reps;Seated (against min resistance) Hip Flexion/Marching: AROM;Both;15 reps;Seated Heel Raises: AROM;Both;15 reps;Seated    General Comments General comments (skin integrity, edema, etc.): orthostatic upon standing. BP 126/76 in supine, to 100's/71 in sitting (pt asymptomatic), to 82/61 in standing. Pt reports constant dizziness, BP recovered immediately following return to sitting      Pertinent Vitals/Pain Pain Assessment: Faces Faces Pain Scale: No hurt Pain Intervention(s): Monitored during session           PT Goals (current goals can now be found in the care plan section) Acute Rehab PT Goals Patient Stated Goal: Go home PT Goal Formulation: With patient Time For Goal Achievement: 06/13/20 Potential to Achieve Goals: Good Progress towards PT goals: Progressing toward goals    Frequency    Min 4X/week      PT Plan Current plan remains appropriate       AM-PAC PT "6 Clicks" Mobility   Outcome Measure  Help needed turning from your back to your side while in a flat bed without using bedrails?: A Little Help needed moving from lying on your back to sitting on the side of a flat bed without using bedrails?: A Little Help needed moving to and from a bed to a chair (including a wheelchair)?: A Little Help needed standing up from a chair using your arms (e.g., wheelchair or bedside chair)?: A Little Help needed to walk in hospital room?: A Lot Help needed climbing 3-5 steps with a railing? : A Lot 6 Click Score: 16    End of Session Equipment Utilized During Treatment: Gait belt Activity Tolerance: Patient tolerated treatment well (limited by prthostatic hypotension) Patient left: in chair;with call bell/phone within reach;with chair  alarm set Nurse Communication: Mobility status (orthostatics) PT Visit Diagnosis: Unsteadiness on feet (R26.81);Other abnormalities of gait and mobility (R26.89);Muscle weakness (generalized) (M62.81);Repeated falls (R29.6)     Time: 5916-3846 PT Time Calculation (min) (ACUTE ONLY): 25 min  Charges:  $Gait Training: 8-22 mins $Therapeutic Activity: 8-22 mins                     Rolm Baptise, PT, DPT   Acute Rehabilitation Department Pager #: [phone]   Gaetana Michaelis 05/31/2020, 4:59 PM

## 2020-06-01 LAB — GLUCOSE, CAPILLARY
Glucose-Capillary: 144 mg/dL — ABNORMAL HIGH (ref 70–99)
Glucose-Capillary: 287 mg/dL — ABNORMAL HIGH (ref 70–99)
Glucose-Capillary: 338 mg/dL — ABNORMAL HIGH (ref 70–99)
Glucose-Capillary: 352 mg/dL — ABNORMAL HIGH (ref 70–99)

## 2020-06-01 MED ORDER — METFORMIN HCL 500 MG PO TABS
500.0000 mg | ORAL_TABLET | Freq: Two times a day (BID) | ORAL | Status: DC
  Administered 2020-06-01 – 2020-06-06 (×10): 500 mg via ORAL
  Filled 2020-06-01 (×10): qty 1

## 2020-06-01 MED ORDER — DEXAMETHASONE 4 MG PO TABS
4.0000 mg | ORAL_TABLET | Freq: Two times a day (BID) | ORAL | Status: DC
  Administered 2020-06-01 – 2020-06-02 (×3): 4 mg via ORAL
  Filled 2020-06-01 (×3): qty 1

## 2020-06-01 NOTE — Plan of Care (Signed)
Mod assist with adls 

## 2020-06-01 NOTE — Plan of Care (Signed)

## 2020-06-01 NOTE — H&P (Incomplete)
Physical Medicine and Rehabilitation Admission H&P     HPI: Shannon [name] is a 55 year old right-handed female with history of anxiety/depression maintained on Seroquel 800 mg nightly as well as Valium as needed and Lamictal, OCD maintained on Luvox type 2 diabetes mellitus, DVT, obesity with BMI 31.32, colon cancer 2018 with resection followed by chemotherapy.  Per chart review patient lives with her 66 year old daughter independent prior to admission and working from home.  Her mother has been assisting with care.  Presented 05/29/2020 with 1 month history of headache nausea vomiting and dizziness with diplopia as well as multiple falls.  Work-up with x-ray and imaging showed a large left cerebellar mass.  Underwent suboccipital craniectomy for resection of left cerebellar mass 05/29/2020 per Dr. Wynetta Emery.  Maintained on Decadron protocol.  Tolerating a regular consistency diet.  Physical medicine rehabilitation was consulted to assess candidacy for CIR given impairment in cognition and mobility.  Patient was admitted for a comprehensive rehab program.  Review of Systems  Constitutional: Negative for chills and fever.  HENT: Negative for hearing loss.   Eyes: Positive for blurred vision.  Respiratory: Positive for shortness of breath. Negative for cough.   Cardiovascular: Negative for chest pain, palpitations and leg swelling.  Gastrointestinal: Positive for constipation. Negative for heartburn, nausea and vomiting.       GERD  Genitourinary: Negative for dysuria, flank pain and hematuria.  Musculoskeletal: Positive for joint pain.  Skin: Negative for rash.  Neurological: Positive for dizziness, tingling and weakness.  Psychiatric/Behavioral: Positive for depression. The patient has insomnia.        Anxiety, OCD  All other systems reviewed and are negative.  Past Medical History:  Diagnosis Date  . Anemia    Iron De  . Anxiety   . Blood clot in vein   . Bowel obstruction  (HCC)   . Colon cancer (HCC) 2018   treated with surgery and radiation  . Depression   . Diabetes mellitus without complication (HCC)    Type II  . DVT (deep venous thrombosis) (HCC) 2019   behind right knee- while she wasa on chemo  . GERD (gastroesophageal reflux disease)   . History of blood transfusion   . History of chemotherapy   . History of kidney stones 2012   passed  . Hypothyroidism   . Obesity    Past Surgical History:  Procedure Laterality Date  . ABDOMINAL HYSTERECTOMY  05/2019   total laparoscopin with tubes and ovariers removed also.  . APPENDECTOMY  05/2019  . COLON SURGERY  03/2017   Colon resection  . COLONOSCOPY  2019  . SUBOCCIPITAL CRANIECTOMY CERVICAL LAMINECTOMY Left 05/29/2020   Procedure: Suboccipital Craniectomy for Resection of Left Cerebellar Mass;  Surgeon: Donalee Citrin, MD;  Location: [facility] OR;  Service: Neurosurgery;  Laterality: Left;   Family History  Problem Relation Age of Onset  . Irritable bowel syndrome Mother   . Colon polyps Father   . Breast cancer Maternal Grandmother   . Diabetes Maternal Grandmother   . Diabetes Maternal Grandfather   . Breast cancer Paternal Grandmother   . Colon polyps Maternal Uncle   . Stomach cancer Neg Hx   . Pancreatic cancer Neg Hx   . Esophageal cancer Neg Hx   . Colon cancer Neg Hx    Social History:  reports that she has never smoked. She has never used smokeless tobacco. She reports current alcohol use. She reports that she does not use drugs. Allergies:  Allergies  Allergen Reactions  . Clindamycin/Lincomycin Rash  . Penicillins Rash    Reaction: 10 years   Medications Prior to Admission  Medication Sig Dispense Refill  . acetaminophen (TYLENOL) 500 MG tablet Take 500-1,000 mg by mouth every 6 (six) hours as needed for moderate pain or mild pain.    Marland Kitchen atorvastatin (LIPITOR) 10 MG tablet Take 10 mg by mouth at bedtime.  0  . diazepam (VALIUM) 2 MG tablet Take 2 mg by mouth 3 (three) times daily  as needed.    . Dulaglutide 1.5 MG/0.5ML SOPN Inject 1.5 mg into the skin once a week. Monday    . fluvoxaMINE (LUVOX) 100 MG tablet Take 3 tablets (300 mg total) by mouth at bedtime. 270 tablet 1  . hyoscyamine (LEVSIN SL) 0.125 MG SL tablet Place 1 tablet (0.125 mg total) under the tongue every 6 (six) hours as needed. 45 tablet 1  . insulin aspart (NOVOLOG) 100 UNIT/ML FlexPen Inject 0-12 Units into the skin 3 (three) times daily with meals. Sliding Scale    . lamoTRIgine (LAMICTAL) 150 MG tablet Take 1 tablet (150 mg total) by mouth daily. (Patient taking differently: No sig reported) 90 tablet 3  . LANTUS SOLOSTAR 100 UNIT/ML Solostar Pen Inject 45 Units into the skin daily. Every AM    . levothyroxine (SYNTHROID) 75 MCG tablet Take 75 mcg by mouth daily.    Marland Kitchen LORazepam (ATIVAN) 0.5 MG tablet Take 0.5 mg by mouth every 6 (six) hours as needed for anxiety. .5mg  or 1mg     . meclizine (ANTIVERT) 25 MG tablet Take 25 mg by mouth daily as needed for dizziness.    . metFORMIN (GLUCOPHAGE) 500 MG tablet Take 500-1,000 mg by mouth See admin instructions. 500 mg in the AM 1000 mg tablets in the PM    . ondansetron (ZOFRAN) 4 MG tablet Take 1 tablet (4 mg total) by mouth every 8 (eight) hours as needed for nausea or vomiting. 30 tablet 1  . promethazine (PHENERGAN) 25 MG tablet Take 25 mg by mouth 2 (two) times daily as needed for nausea/vomiting.    . SEROQUEL 400 MG tablet TAKE 2 TABLETS (800 MG TOTAL) BY MOUTH AT BEDTIME. BRAND MEDICALLY NECESSARY 180 tablet 5  . dexamethasone (DECADRON) 4 MG tablet Take 1 tablet (4 mg total) by mouth 3 (three) times daily. 90 tablet 0  . metroNIDAZOLE (FLAGYL) 250 MG tablet Take 1 tablet (250 mg total) by mouth 3 (three) times daily. (Patient not taking: No sig reported) 42 tablet 0    Drug Regimen Review Drug regimen was reviewed and remains appropriate with no significant issues identified  Home: Home Living Family/patient expects to be discharged to::  Private residence Living Arrangements: Parent,Children (mother and 44 yo daughter) Available Help at Discharge: Family,Available 24 hours/day Type of Home: House Home Access: Stairs to enter,Level entry Entrance Stairs-Number of Steps: 5-6 at front; and 2-3 steps in back Home Layout: One level Bathroom Shower/Tub: Walk-in Armed forces operational officer: Standard Home Equipment: Civil engineer, contracting - built in,Hand held shower head,Wheelchair - Administrator, arts - 2 wheels Additional Comments: mother been staying past few weeks. two falls since decline in Nov   Functional History: Prior Function Level of Independence: Needs assistance Gait / Transfers Assistance Needed: Use of w/c ADL's / Homemaking Assistance Needed: Performing BADLs Comments: Recently decline since Nov 24 where she couldnt get around without help. 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 Ambulation/Gait Ambulation/Gait assistance: Min assist Gait Distance (Feet): 3 Feet Assistive device: Rolling walker (2 wheeled) Gait Pattern/deviations: Step-to pattern,Decreased stride length,Narrow base of support General Gait Details: small lateral steps with narrow BOS, pt with less posterior lean, but less distance on gait today due to orthostatic upon standing Gait velocity: decreased    ADL: ADL Overall ADL's : Needs assistance/impaired Eating/Feeding: Set  up,Sitting Grooming: Wash/dry hands,Minimal assistance,Standing Upper Body Bathing: Minimal assistance,Sitting Lower Body Bathing: Moderate assistance,Sit to/from stand Upper Body Dressing : Minimal assistance,Sitting Lower Body Dressing: Minimal assistance,Sit to/from stand Lower Body Dressing Details (indicate cue type and reason): Min A for coorindation in donning socks over toes. Min A for additional support in sitting Toilet Transfer: Minimal assistance,+2 for safety/equipment,Ambulation,Regular Toilet,Grab bars Toilet Transfer Details (indicate cue type and reason): Min A for safety Toileting- Clothing Manipulation and Hygiene: Min guard,Sitting/lateral lean Functional mobility during ADLs: Minimal assistance,Moderate assistance,Rolling walker General ADL Comments: Pt presenting with decreased coorination, cognition, nad balance imapcting her safe performance of ADLs  Cognition: Cognition Overall Cognitive Status: Impaired/Different from baseline Orientation Level: Oriented X4 Cognition Arousal/Alertness: Awake/alert Behavior During Therapy: WFL for tasks assessed/performed (mild impulsivity) Overall Cognitive Status: Impaired/Different from baseline Area of Impairment: Memory,Safety/judgement,Awareness,Problem solving Orientation Level: Disoriented to,Time Current Attention Level: Sustained Memory: Decreased short-term memory Following Commands: Follows one step commands consistently Safety/Judgement: Decreased awareness of safety,Decreased awareness of deficits Awareness: Emergent Problem Solving: Slow processing,Difficulty sequencing,Requires verbal cues General Comments: Pt agreeable to therapy and motivated. Good recall of past therapy session and mobility in the morning. Pt able to verbalize needs at this time, improved fluidity and accuracy of answers this session compared to eval  Physical Exam: Blood pressure 113/73, pulse 83, temperature 98.3 F (36.8 C), temperature  source Oral, resp. rate 17, height 5\' 8"  (1.727 m), weight 93.4 kg, last menstrual period 02/22/2019, SpO2 95 %. Physical Exam HENT:     Head:     Comments: Craniotomy site clean and dry Neurological:     Comments: Patient is alert.  Speech is slow but deliberate.  Provides her name and age.  Follows simple commands.     Results for orders placed or performed during the hospital encounter of 05/29/20 (from the past 48 hour(s))  Glucose, capillary     Status: Abnormal   Collection Time: 05/30/20  4:10 PM  Result Value Ref Range   Glucose-Capillary 357 (H) 70 - 99 mg/dL    Comment: Glucose reference range applies only to samples taken after fasting for at least 8 hours.  Glucose, capillary     Status: Abnormal   Collection Time: 05/30/20 10:00 PM  Result Value Ref Range   Glucose-Capillary 365 (H) 70 - 99 mg/dL    Comment: Glucose reference range applies only to samples taken after fasting for at least 8 hours.  Glucose, capillary     Status: Abnormal   Collection Time: 05/31/20  7:17 AM  Result Value Ref Range   Glucose-Capillary 271 (H) 70 - 99 mg/dL    Comment: Glucose reference range applies only to  samples taken after fasting for at least 8 hours.  Glucose, capillary     Status: Abnormal   Collection Time: 05/31/20 11:02 AM  Result Value Ref Range   Glucose-Capillary 351 (H) 70 - 99 mg/dL    Comment: Glucose reference range applies only to samples taken after fasting for at least 8 hours.  Glucose, capillary     Status: Abnormal   Collection Time: 05/31/20  4:45 PM  Result Value Ref Range   Glucose-Capillary 376 (H) 70 - 99 mg/dL    Comment: Glucose reference range applies only to samples taken after fasting for at least 8 hours.  Hemoglobin A1c     Status: Abnormal   Collection Time: 05/31/20  6:25 PM  Result Value Ref Range   Hgb A1c MFr Bld 9.6 (H) 4.8 - 5.6 %    Comment: (NOTE) Pre diabetes:          5.7%-6.4%  Diabetes:              >6.4%  Glycemic control for    <7.0% adults with diabetes    Mean Plasma Glucose 228.82 mg/dL    Comment: Performed at [facility] [address] [place], [place] [address]  Glucose, capillary     Status: Abnormal   Collection Time: 05/31/20  9:49 PM  Result Value Ref Range   Glucose-Capillary 348 (H) 70 - 99 mg/dL    Comment: Glucose reference range applies only to samples taken after fasting for at least 8 hours.  Glucose, capillary     Status: Abnormal   Collection Time: 06/01/20  6:08 AM  Result Value Ref Range   Glucose-Capillary 144 (H) 70 - 99 mg/dL    Comment: Glucose reference range applies only to samples taken after fasting for at least 8 hours.  Glucose, capillary     Status: Abnormal   Collection Time: 06/01/20 12:04 PM  Result Value Ref Range   Glucose-Capillary 338 (H) 70 - 99 mg/dL    Comment: Glucose reference range applies only to samples taken after fasting for at least 8 hours.   Comment 1 Notify RN    Comment 2 Document in Chart    No results found.     Medical Problem List and Plan: 1.  Dizziness with blurred vision and decreased functional mobility secondary to left cerebellar intra-axial lesion, likely metastatic with history of colon cancer 2018.  Status post suboccipital craniectomy resection of cerebellar mass 05/29/2020.  Decadron tapered  -patient may *** shower  -ELOS/Goals: *** 2.  Antithrombotics: -DVT/anticoagulation: SCDs  -antiplatelet therapy: N/A 3. Pain Management: Oxycodone as needed 4. Mood: Luvox 300 mg nightly, Lamictal 150 mg daily  -antipsychotic agents: Seroquel 800 mg nightly 5. Neuropsych: This patient is capable of making decisions on her own behalf. 6. Skin/Wound Care: Routine skin checks 7. Fluids/Electrolytes/Nutrition: Routine in and outs with follow-up chemistries 8.  Diabetes mellitus.  Hemoglobin A1c 9.6.  Glucophage 500 mg twice daily, NovoLog 6 units 3 times daily, Lantus insulin 20 units daily.  Check blood sugars before meals and at  bedtime 9.  Hypothyroidism.  Synthroid 10.  Hyperlipidemia.  Lipitor 11.  GERD.  Protonix 12.  Obesity.  BMI 31.32.  Dietary follow-up 13.  OCD.  Continue Luvox ***  Cathlyn Parsons, PA-C 06/01/2020

## 2020-06-01 NOTE — Progress Notes (Signed)
Subjective: Patient reports doing well.  Concerned about blood glucose.  Objective: Vital signs in last 24 hours: Temp:  [97.8 F (36.6 C)-98.6 F (37 C)] 97.8 F (36.6 C) (01/01 0855) Pulse Rate:  [69-82] 80 (01/01 0855) Resp:  [15-18] 15 (01/01 0855) BP: (124-155)/(78-85) 143/85 (01/01 0855) SpO2:  [96 %-100 %] 97 % (01/01 0855)  Intake/Output from previous day: 12/31 0701 - 01/01 0700 In: 770 [P.O.:770] Out: 1100 [Urine:1100] Intake/Output this shift: No intake/output data recorded.  Physical Exam: Dressing CDI.  No dysmetria UEs, currently resting in bed.   Lab Results: Recent Labs    05/29/20 1028 05/29/20 1600  WBC  --  7.6  HGB 12.2 12.0  HCT 36.0 35.0*  PLT  --  120*   BMET Recent Labs    05/29/20 1600 05/30/20 0500  NA 138 136  K 4.5 4.3  CL 106 106  CO2 24 21*  GLUCOSE 195* 218*  BUN 11 10  CREATININE 1.00 0.88  CALCIUM 8.1* 8.1*    Studies/Results: MR BRAIN W WO CONTRAST  Result Date: 05/30/2020 CLINICAL DATA:  Brain mass or lesion. Status post craniotomy for brain mass. EXAM: MRI HEAD WITHOUT AND WITH CONTRAST TECHNIQUE: Multiplanar, multiecho pulse sequences of the brain and surrounding structures were obtained without and with intravenous contrast. CONTRAST:  30mL GADAVIST GADOBUTROL 1 MMOL/ML IV SOLN COMPARISON:  MRI May 27, 2020. FINDINGS: Brain: Interval resection of the previously described left cerebellar mass. Evaluation is limited by the iron throughout the left cerebellum from prior Feraheme injection. Post-contrast is imaging is particularly limited within these limitations, no specific evidence of residual tumor. T2 hypointense area in the anterior aspect of the resection cavity is favored to represent an air bubble. There is expected blood products within the resection cavity and expected devitalized tissue. Redemonstrated left cerebellar edema with T1 hyperintensity from Feraheme staining. Decreased mass effect and decreased  effacement of the fourth ventricle. The ventricular system remains mildly dilated, but this is slightly improved from the prior. Mild periventricular edema. No midline shift. Basal cisterns are patent. Vascular: Major arterial flow voids are maintained at the skull base. Skull and upper cervical spine: Left occipital craniectomy. Heterogeneous marrow signal without evidence of focal marrow replacing lesion. Sinuses/Orbits: Mild paranasal sinus mucosal thickening without air-fluid levels. Unremarkable orbits. Other: Trace bilateral mastoid effusions. IMPRESSION: 1. 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 Evaluation is limited by iron throughout the left cerebellum from prior Feraheme injection. Post-contrast is imaging is particularly limited. Within these limitations, no specific evidence of residual tumor. T2 hypointense area in the anterior aspect of the resection cavity is favored to represent an air bubble, but warrants attention on follow. 2. Redemonstrated left cerebellar edema with decreased mass effect and decreased effacement of the fourth ventricle. The ventricular system remains dilated, but this is slightly improved from the prior. Similar mild periventricular edema. Electronically Signed   By: Feliberto Harts MD   On: 05/30/2020 10:38    Assessment/Plan: Patient is doing well.  Mobilize with PT.  Awaiting Rehab.  Will taper decadron and restart metformin.    LOS: 3 days    Dorian Heckle, MD 06/01/2020, 9:20 AM

## 2020-06-01 NOTE — Progress Notes (Signed)
Inpatient Diabetes Program Recommendations  AACE/ADA: New Consensus Statement on Inpatient Glycemic Control (2015)  Target Ranges:  Prepandial:   less than 140 mg/dL      Peak postprandial:   less than 180 mg/dL (1-2 hours)      Critically ill patients:  140 - 180 mg/dL   Lab Results  Component Value Date   GLUCAP 144 (H) 06/01/2020   HGBA1C 9.6 (H) 05/31/2020    Review of Glycemic Control Results for MARGRETT, KALB (MRN [id number]) as of 06/01/2020 09:53  Ref. Range 05/31/2020 11:02 05/31/2020 16:45 05/31/2020 21:49 06/01/2020 06:08  Glucose-Capillary Latest Ref Range: 70 - 99 mg/dL 371 (H) 062 (H) 694 (H) 144 (H)   Diabetes history: Type 2 DM Outpatient Diabetes medications: Lantus 45 units QD, Novolog 0-12 units TID, Dulaglutide 1.5 mg Qwk, Metformin 500 QAM/1000 mg QPM Current orders for Inpatient glycemic control: Novolog 0-20 units TID, Lantus 22 units QD, Metformin 500 mg BID Decadron 4 mg BID  Inpatient Diabetes Program Recommendations:    Glucose trends likely elevated from steroids. In agreement with AM changes. Patient could also benefit from Novolog 6 units TID (assuming patient is consuming >50% of meals).   Thanks, Lujean Rave, MSN, RNC-OB Diabetes Coordinator [phone] (8a-5p)

## 2020-06-02 LAB — GLUCOSE, CAPILLARY
Glucose-Capillary: 381 mg/dL — ABNORMAL HIGH (ref 70–99)
Glucose-Capillary: 364 mg/dL — ABNORMAL HIGH (ref 70–99)
Glucose-Capillary: 197 mg/dL — ABNORMAL HIGH (ref 70–99)
Glucose-Capillary: 255 mg/dL — ABNORMAL HIGH (ref 70–99)
Glucose-Capillary: 344 mg/dL — ABNORMAL HIGH (ref 70–99)
Glucose-Capillary: 211 mg/dL — ABNORMAL HIGH (ref 70–99)
Glucose-Capillary: 123 mg/dL — ABNORMAL HIGH (ref 70–99)

## 2020-06-02 LAB — HEMOGLOBIN A1C
Hgb A1c MFr Bld: 9.7 % — ABNORMAL HIGH (ref 4.8–5.6)
Mean Plasma Glucose: 231.69 mg/dL

## 2020-06-02 MED ORDER — PANTOPRAZOLE SODIUM 40 MG PO TBEC
40.0000 mg | DELAYED_RELEASE_TABLET | Freq: Every day | ORAL | Status: DC
  Administered 2020-06-02 – 2020-06-05 (×4): 40 mg via ORAL
  Filled 2020-06-02 (×4): qty 1

## 2020-06-02 MED ORDER — SENNOSIDES-DOCUSATE SODIUM 8.6-50 MG PO TABS
1.0000 | ORAL_TABLET | Freq: Every day | ORAL | Status: DC
  Administered 2020-06-04 – 2020-06-05 (×2): 1 via ORAL
  Filled 2020-06-02 (×4): qty 1

## 2020-06-02 MED ORDER — INSULIN ASPART 100 UNIT/ML ~~LOC~~ SOLN
0.0000 [IU] | SUBCUTANEOUS | Status: DC
  Administered 2020-06-02: 9 [IU] via SUBCUTANEOUS
  Administered 2020-06-02: 2 [IU] via SUBCUTANEOUS
  Administered 2020-06-02: 7 [IU] via SUBCUTANEOUS
  Administered 2020-06-02: 5 [IU] via SUBCUTANEOUS
  Administered 2020-06-02: 1 [IU] via SUBCUTANEOUS
  Administered 2020-06-02: 3 [IU] via SUBCUTANEOUS
  Administered 2020-06-03: 1 [IU] via SUBCUTANEOUS
  Administered 2020-06-03: 5 [IU] via SUBCUTANEOUS
  Administered 2020-06-03: 3 [IU] via SUBCUTANEOUS
  Administered 2020-06-03 – 2020-06-04 (×2): 2 [IU] via SUBCUTANEOUS
  Administered 2020-06-04: 1 [IU] via SUBCUTANEOUS

## 2020-06-02 MED ORDER — INSULIN ASPART 100 UNIT/ML ~~LOC~~ SOLN
6.0000 [IU] | Freq: Three times a day (TID) | SUBCUTANEOUS | Status: DC
  Administered 2020-06-02 – 2020-06-06 (×12): 6 [IU] via SUBCUTANEOUS

## 2020-06-02 MED ORDER — BISACODYL 5 MG PO TBEC
5.0000 mg | DELAYED_RELEASE_TABLET | Freq: Every day | ORAL | Status: DC | PRN
  Administered 2020-06-02: 5 mg via ORAL
  Filled 2020-06-02: qty 1

## 2020-06-02 MED ORDER — DEXAMETHASONE 2 MG PO TABS
2.0000 mg | ORAL_TABLET | Freq: Two times a day (BID) | ORAL | Status: DC
  Administered 2020-06-02 – 2020-06-06 (×8): 2 mg via ORAL
  Filled 2020-06-02 (×8): qty 1

## 2020-06-02 NOTE — Progress Notes (Signed)
Pt's CBG 352 w/o coverage. On-call MD for [facility] Neurosurgery paged for possible insuline coverage.

## 2020-06-02 NOTE — Progress Notes (Signed)
Subjective: Patient reports some soreness, otherwise doing well  Objective: Vital signs in last 24 hours: Temp:  [97.6 F (36.4 C)-98.3 F (36.8 C)] 97.8 F (36.6 C) (01/02 0745) Pulse Rate:  [71-95] 95 (01/02 0745) Resp:  [16-20] 16 (01/02 0745) BP: (113-145)/(62-91) 131/86 (01/02 0745) SpO2:  [95 %-99 %] 98 % (01/02 0745)  Intake/Output from previous day: 01/01 0701 - 01/02 0700 In: 474 [P.O.:474] Out: -  Intake/Output this shift: No intake/output data recorded.  Neurologic: Grossly normal, wound good  Lab Results: Lab Results  Component Value Date   WBC 7.6 05/29/2020   HGB 12.0 05/29/2020   HCT 35.0 (L) 05/29/2020   MCV 88.8 05/29/2020   PLT 120 (L) 05/29/2020   No results found for: INR, PROTIME BMET Lab Results  Component Value Date   NA 136 05/30/2020   K 4.3 05/30/2020   CL 106 05/30/2020   CO2 21 (L) 05/30/2020   GLUCOSE 218 (H) 05/30/2020   BUN 10 05/30/2020   CREATININE 0.88 05/30/2020   CALCIUM 8.1 (L) 05/30/2020    Studies/Results: No results found.  Assessment/Plan: Doing well, pt/ot, CIR wean decadron  Estimated body mass index is 31.32 kg/m as calculated from the following:   Height as of this encounter: 5\' 8"  (1.727 m).   Weight as of this encounter: 93.4 kg.    LOS: 4 days    06/02/2020, 9:00 AM

## 2020-06-02 NOTE — Plan of Care (Signed)

## 2020-06-02 NOTE — Plan of Care (Signed)
  Problem: Education: Goal: Knowledge of General Education information will improve Description Including pain rating scale, medication(s)/side effects and non-pharmacologic comfort measures Outcome: Progressing   Problem: Health Behavior/Discharge Planning: Goal: Ability to manage health-related needs will improve Outcome: Progressing   

## 2020-06-02 NOTE — Progress Notes (Signed)
Inpatient Rehab Admissions:  Inpatient Rehab Consult received.  I met with patient and her mother at the bedside for rehabilitation assessment and to discuss goals and expectations of an inpatient rehab admission.  Both acknowledged understanding of goals ane expectations. Pt interested in pursuing CIR. Pt's mother, Marcie Bal, will be available to continue assisting pt after hospital discharge. Will continue to follow.   Signed: Gayland Curry, [place], [place] Admissions Coordinator [phone]

## 2020-06-03 ENCOUNTER — Other Ambulatory Visit: Payer: Self-pay | Admitting: Radiation Therapy

## 2020-06-03 ENCOUNTER — Inpatient Hospital Stay (HOSPITAL_COMMUNITY): Payer: PRIVATE HEALTH INSURANCE

## 2020-06-03 LAB — GLUCOSE, CAPILLARY
Glucose-Capillary: 114 mg/dL — ABNORMAL HIGH (ref 70–99)
Glucose-Capillary: 133 mg/dL — ABNORMAL HIGH (ref 70–99)
Glucose-Capillary: 147 mg/dL — ABNORMAL HIGH (ref 70–99)
Glucose-Capillary: 199 mg/dL — ABNORMAL HIGH (ref 70–99)
Glucose-Capillary: 239 mg/dL — ABNORMAL HIGH (ref 70–99)
Glucose-Capillary: 260 mg/dL — ABNORMAL HIGH (ref 70–99)

## 2020-06-03 LAB — SURGICAL PATHOLOGY

## 2020-06-03 MED ORDER — ACETAMINOPHEN 325 MG PO TABS
650.0000 mg | ORAL_TABLET | Freq: Four times a day (QID) | ORAL | Status: DC | PRN
  Administered 2020-06-03: 650 mg via ORAL
  Filled 2020-06-03 (×2): qty 2

## 2020-06-03 MED ORDER — SODIUM CHLORIDE 0.9 % IV BOLUS
500.0000 mL | Freq: Once | INTRAVENOUS | Status: AC
  Administered 2020-06-03: 500 mL via INTRAVENOUS

## 2020-06-03 NOTE — PMR Pre-admission (Signed)
PMR Admission Coordinator Pre-Admission Assessment  Patient: Shannon [name] is an 55 y.o., female MRN: [id number] DOB: 06/26/1965 Height: 5' 8"  (172.7 cm) Weight: 93.4 kg              Insurance Information HMO:     PPO: yes     PCP:      IPA:      80/20:      OTHER:  PRIMARY: [employer]      Policy#: [id number]      Subscriber: patient CM Name: Vaughan Browner       Phone#:  [phone] ext [phone]    Fax#: [phone] Pre-Cert#: [id number] [place] for CIR provided by Vaughan Browner with [employer] with updates due to fax listed above on 1/11      Employer:  Benefits:  Phone #:      Name:  Eff. Date: 06/01/13     Deduct: $750 ($0 met)      Out of Pocket Max: $3500 ($0 met)      Life Max: n/a  CIR: 80%       SNF: 80% Outpatient: 80%     Co-Ins: 20% Home Health: 80%      Co-Ins: 20% DME: 80%     Co-Ins: 20% Providers: in-network SECONDARY:       Policy#:       Phone#:   Development worker, community:       Phone#:   The Engineer, petroleum" for patients in Inpatient Rehabilitation Facilities with attached "Privacy Act [place] Records" was provided and verbally reviewed with: N/A  Emergency Contact Information Contact Information    Name Relation Home Work Mobile   Turner,Janet Mother [phone]  [phone]     Current Medical History  Patient Admitting Diagnosis: L cerebellar metastasis resection  History of Present Illness: Pt is a 55 y.o. female with history of T2DM, DVT, colon cancer one month history of HA, N/V with dizziness, diplopia, multiple falls with work up revealing large left cerebellar mass. She was admitted on 05/29/20 for suboccipital crani for tumor resection by Dr. Saintclair Halsted. Post op therapy evaluations revealed weakness, ataxia,scissoring gait, lacks safety awareness with affect her mobility and ability to perform ADLs.  She was independent prior a month ago and mother has been assisting with ADLs with WC being used for mobility. Physical Medicine & Rehabilitation was  consulted to assess candidacy for CIR given impaired cognition and mobility.   Pt will be undergoing radiation while on rehab.  Schedule is below.  Social Work team and therapy team to coordinate schedules to ensure pt can maximize rehab potential and begin radiation.    1/11: MRI scheduled in the PM at [facility]  1/12: 830 AM nurse eval, 9AM MD consult, 1015 IV simulation, 11AM radiation simulation  1/13: 2PM MD consult, 315 Radiation  1/17 and 1/19 at 215 Radiation   Glasgow Coma Scale Score: 15  Past Medical History  Past Medical History:  Diagnosis Date  . Anemia    Iron De  . Anxiety   . Blood clot in vein   . Bowel obstruction ([address])   . Colon cancer ([place]) 2018   treated with surgery and radiation  . Depression   . Diabetes mellitus without complication ([place])    Type II  . DVT (deep venous thrombosis) ([place]) 2019   behind right knee- while she wasa on chemo  . GERD (gastroesophageal reflux disease)   . History of blood transfusion   . History of chemotherapy   .  History of kidney stones 2012   passed  . Hypothyroidism   . Obesity     Family History  family history includes Breast cancer in her maternal grandmother and paternal grandmother; Colon polyps in her father and maternal uncle; Diabetes in her maternal grandfather and maternal grandmother; Irritable bowel syndrome in her mother.  Prior Rehab/Hospitalizations:  Has the patient had prior rehab or hospitalizations prior to admission? No  Has the patient had major surgery during 100 days prior to admission? Yes  Current Medications   Current Facility-Administered Medications:  .  0.9 % NaCl with KCl 20 mEq/ L  infusion, , Intravenous, Continuous, Eustace Moore, MD, Stopped at 06/02/20 1003 .  acetaminophen (TYLENOL) tablet 650 mg, 650 mg, Oral, Q6H PRN, Meyran, Ocie Cornfield, NP .  atorvastatin (LIPITOR) tablet 10 mg, 10 mg, Oral, QHS, Meyran, Ocie Cornfield, NP, 10 mg at 06/02/20 2143 .  bisacodyl  (DULCOLAX) EC tablet 5 mg, 5 mg, Oral, Daily PRN, Eustace Moore, MD, 5 mg at 06/02/20 1327 .  Chlorhexidine Gluconate Cloth 2 % PADS 6 each, 6 each, Topical, Daily, Kary Kos, MD, 6 each at 06/03/20 0831 .  dexamethasone (DECADRON) tablet 2 mg, 2 mg, Oral, Q12H, Eustace Moore, MD, 2 mg at 06/03/20 0830 .  docusate sodium (COLACE) capsule 100 mg, 100 mg, Oral, BID, Kary Kos, MD, 100 mg at 06/03/20 0830 .  fluvoxaMINE (LUVOX) tablet 300 mg, 300 mg, Oral, QHS, Meyran, Ocie Cornfield, NP, 300 mg at 06/02/20 2143 .  HYDROmorphone (DILAUDID) injection 0.5-1 mg, 0.5-1 mg, Intravenous, Q2H PRN, Kary Kos, MD, 1 mg at 06/01/20 1553 .  insulin aspart (novoLOG) injection 0-9 Units, 0-9 Units, Subcutaneous, Q4H, Erline Levine, MD, 2 Units at 06/03/20 1238 .  insulin aspart (novoLOG) injection 6 Units, 6 Units, Subcutaneous, TID WC, Erline Levine, MD, 6 Units at 06/03/20 1237 .  insulin glargine (LANTUS) injection 22 Units, 22 Units, Subcutaneous, Daily, Meyran, Ocie Cornfield, NP, 22 Units at 06/03/20 0831 .  labetalol (NORMODYNE) injection 10-40 mg, 10-40 mg, Intravenous, Q10 min PRN, Kary Kos, MD .  lamoTRIgine (LAMICTAL) tablet 150 mg, 150 mg, Oral, QHS, Meyran, Ocie Cornfield, NP, 150 mg at 06/02/20 2143 .  levothyroxine (SYNTHROID) tablet 75 mcg, 75 mcg, Oral, Q0200, Meyran, Ocie Cornfield, NP, 75 mcg at 06/03/20 0509 .  metFORMIN (GLUCOPHAGE) tablet 500 mg, 500 mg, Oral, BID WC, Erline Levine, MD, 500 mg at 06/03/20 0830 .  ondansetron (ZOFRAN) tablet 4 mg, 4 mg, Oral, Q4H PRN **OR** ondansetron (ZOFRAN) injection 4 mg, 4 mg, Intravenous, Q4H PRN, Kary Kos, MD .  pantoprazole (PROTONIX) EC tablet 40 mg, 40 mg, Oral, QHS, [place], Yujing Z, RPH, 40 mg at 06/02/20 2143 .  promethazine (PHENERGAN) tablet 12.5-25 mg, 12.5-25 mg, Oral, Q4H PRN, Kary Kos, MD .  QUEtiapine (SEROQUEL) tablet 800 mg, 800 mg, Oral, QHS, Meyran, Ocie Cornfield, NP, 800 mg at 06/02/20 2149 .  senna-docusate  (Senokot-S) tablet 1 tablet, 1 tablet, Oral, QHS, Eustace Moore, MD  Patients Current Diet:  Diet Order            Diet Carb Modified Fluid consistency: Thin; Room service appropriate? Yes with Assist  Diet effective now                 Precautions / Restrictions Precautions Precautions: Fall Precaution Comments: orthostatic hypotension Restrictions Weight Bearing Restrictions: No   Has the patient had 2 or more falls or a fall with injury in the past year?Yes  Prior Activity Level Limited Community (1-2x/wk): 2-3x/week prior to 11/24. Post 11/24 pt leaves house 1x or less  Prior Functional Level Prior Function Level of Independence: Needs assistance Gait / Transfers Assistance Needed: Use of w/c ADL's / Homemaking Assistance Needed: Performing BADLs Comments: Recently decline since Nov 24 where she couldnt get around without help. 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  Needed some help  Indoor Mobility: Did the patient need assistance with walking from room to room (with or without device)? Pt has been using a wheel chair for the past month.  Pt noted she used walker since hospital admission  Stairs: Did the patient need assistance with internal or external stairs (with or without device)? Independent  Functional Cognition: Did the patient need help planning regular tasks such as shopping or remembering to take medications? Needed some help  Home Assistive Devices / Equipment Home Assistive Devices/Equipment: Investment banker, operational (specify quad or straight),Eyeglasses,CBG Meter Home Equipment: Shower seat - built in,Hand held shower head,Wheelchair - manual,Shower seat,Walker - 2 wheels  Prior Device Use: Indicate devices/aids used by the patient prior to current illness, exacerbation or injury? Manual wheelchair  Current Functional Level Cognition  Overall Cognitive  Status: Impaired/Different from baseline Current Attention Level: Selective Orientation Level: Oriented X4 Following Commands: Follows multi-step commands consistently,Follows multi-step commands with increased time Safety/Judgement: Decreased awareness of safety,Decreased awareness of deficits General Comments: pt asking good questions and showing improved executive level function. Delayed processing continues    Extremity Assessment (includes Sensation/Coordination)  Upper Extremity Assessment: Defer to OT evaluation  Lower Extremity Assessment: Generalized weakness,RLE deficits/detail,LLE deficits/detail RLE Coordination: decreased fine motor,decreased gross motor LLE Coordination: decreased fine motor,decreased gross motor    ADLs  Overall ADL's : Needs assistance/impaired Eating/Feeding: Set up,Sitting Grooming: Wash/dry hands,Minimal assistance,Standing Upper Body Bathing: Minimal assistance,Sitting Lower Body Bathing: Moderate assistance,Sit to/from stand Upper Body Dressing : Minimal assistance,Sitting Lower Body Dressing: Minimal assistance,Sit to/from stand Lower Body Dressing Details (indicate cue type and reason): Min A for coorindation in donning socks over toes. Min A for additional support in sitting Toilet Transfer: Minimal assistance,+2 for safety/equipment,Ambulation,Regular Toilet,Grab bars Toilet Transfer Details (indicate cue type and reason): Min A for safety Toileting- Clothing Manipulation and Hygiene: Min guard,Sitting/lateral lean Functional mobility during ADLs: Minimal assistance,Moderate assistance,Rolling walker General ADL Comments: Pt presenting with decreased coorination, cognition, nad balance imapcting her safe performance of ADLs    Mobility  Overal bed mobility: Needs Assistance Bed Mobility: Supine to Sit Supine to sit: Supervision General bed mobility comments: pt able to come slowly but safelt to EOB without physical assist    Transfers   Overall transfer level: Needs assistance Equipment used: Rolling walker (2 wheeled) Transfers: Sit to/from Stand Sit to Stand: Min guard General transfer comment: UE support needed as pt comes to standing, close guarding given    Ambulation / Gait / Stairs / Wheelchair Mobility  Ambulation/Gait Ambulation/Gait assistance: Herbalist (Feet): 50 Feet (3x) Assistive device: Rolling walker (2 wheeled) Gait Pattern/deviations: Step-to pattern,Decreased stride length,Narrow base of support General Gait Details: took increased time performing supine, seated and standing exercises before BP increased enough to safely ambulate. Pt unsteady with ambulation as well as mildly dizzy so chair brought behind for safety and frequent rest breaks needed. Gait velocity: decreased Gait velocity interpretation: <1.31 ft/sec, indicative of household ambulator    Posture / Balance Dynamic Sitting Balance Sitting balance -  Comments: can maintain static sitting without support Balance Overall balance assessment: Needs assistance Sitting-balance support: No upper extremity supported,Feet supported Sitting balance-Leahy Scale: Fair Sitting balance - Comments: can maintain static sitting without support Standing balance support: Bilateral upper extremity supported,During functional activity,No upper extremity supported Standing balance-Leahy Scale: Poor Standing balance comment: Reliant on UE support and physical assist    Special needs/care consideration Continuous Drip IV  0.9% NaCl with KCl 20 mEq/L infusion, Skin surgical incision: head/posterior, Diabetic management novoLOG: 6 units 3x daily with meals; novoLOG: 0-9 units every 4 hours; Lantus:  22 units daily and Designated visitor Amedeo Kinsman, mother     Previous Home Environment (from acute therapy documentation) Living Arrangements: Parent,Children  Lives With: Daughter,Family Available Help at Discharge: Available 24  hours/day,Family Type of Home: [place]: One level Home Access: Stairs to enter,Ramped entrance (ramped entrance in garage) Entrance Stairs-Rails: None Entrance Stairs-Number of Steps: 5 in front of house Bathroom Shower/Tub: Multimedia programmer: Standard Bathroom Accessibility: Yes How Accessible: Accessible via walker [place]: No Additional Comments: mother been staying past few weeks. two falls since decline in Nov  Discharge Living Setting Plans for Discharge Living Setting: Patient's home Type of Home at Discharge: House Discharge Home Layout: One level Discharge Home Access: Stairs to enter,Ramped entrance (ramped entracne in garage) Entrance Stairs-Rails: None Entrance Stairs-Number of Steps: 5 steps in front of house Discharge Bathroom Shower/Tub: Walk-in shower Discharge Bathroom Toilet: Standard Discharge Bathroom Accessibility: Yes How Accessible: Accessible via walker Does the patient have any problems obtaining your medications?: No  Social/Family/Support Systems Patient Roles: Parent Anticipated Caregiver: Amedeo Kinsman, mother Anticipated Caregiver's Contact Information: [phone] Caregiver Availability: 24/7 Discharge Plan Discussed with Primary Caregiver: Yes Is Caregiver In Agreement with Plan?: Yes Does Caregiver/Family have Issues with Lodging/Transportation while Pt is in Rehab?: No   Goals Patient/Family Goal for Rehab: supervision: PT/OT/ST Expected length of stay: 7-10 days Pt/Family Agrees to Admission and willing to participate: Yes Program Orientation Provided & Reviewed with Pt/Caregiver Including Roles  & Responsibilities: Yes   Decrease burden of Care through IP rehab admission: NA   Possible need for SNF placement upon discharge: Not anticipated   Patient Condition: This patient's medical and functional status has changed since the consult dated: 05/31/2020 in which the Rehabilitation Physician determined  and documented that the patient's condition is appropriate for intensive rehabilitative care in an inpatient rehabilitation facility. See "History of Present Illness" (above) for medical update. Functional changes are: pt is min assist for mobility and min guard for ADLs. Patient's medical and functional status update has been discussed with the Rehabilitation physician and patient remains appropriate for inpatient rehabilitation. Will admit to inpatient rehab today.  Preadmission Screen Completed By:  Bethel Born, CCC-SLP, 06/03/2020 3:51 PM ______________________________________________________________________   Discussed status with Dr. Dagoberto Ligas on 06/06/20 at 9:55 AM and received approval for admission today.  Admission Coordinator:  Bethel Born, time 9:55 AM Sudie Grumbling 06/06/20

## 2020-06-03 NOTE — Plan of Care (Signed)

## 2020-06-03 NOTE — Progress Notes (Signed)
Patient unable to tolerate bolus of fluids in current IV and wants port accessed, IV team assessed and needs chest xray to confirm port placement. Ordered for chest ray obtained and waiting to be done. IV team will access port after xray resulted.   Shannon [name]

## 2020-06-03 NOTE — Progress Notes (Signed)
Subjective: Patient reports Doing much better no significant headache nausea improved still little bit of dizziness and vertigo  Objective: Vital signs in last 24 hours: Temp:  [97.4 F (36.3 C)-98.2 F (36.8 C)] 97.4 F (36.3 C) (01/03 1250) Pulse Rate:  [82-101] 101 (01/03 1250) Resp:  [14-18] 16 (01/03 1250) BP: (116-146)/(81-96) 130/93 (01/03 1250) SpO2:  [95 %-98 %] 95 % (01/03 1250)  Intake/Output from previous day: No intake/output data recorded. Intake/Output this shift: No intake/output data recorded.  Awake alert strength 5 of 5 upper and lower extremities incision clean dry and intact  Lab Results: No results for input(s): WBC, HGB, HCT, PLT in the last 72 hours. BMET No results for input(s): NA, K, CL, CO2, GLUCOSE, BUN, CREATININE, CALCIUM in the last 72 hours.  Studies/Results: No results found.  Assessment/Plan: Patient is postop day 5 posterior fossa suboccipital craniectomy for resection of left cerebellar mass doing fairly well biggest problem is orthostasis now working with physical therapy we will try to give a fluid bolus and see how that blood pressure swings play out over the next day or so patient is stable from my perspective to transfer to rehab when bed becomes available.  LOS: 5 days     Mariam Dollar 06/03/2020, 12:57 PM

## 2020-06-03 NOTE — Progress Notes (Signed)
Physical Therapy Treatment Patient Details Name: Shannon [name] MRN: [id number] DOB: [birth year] Today's Date: 06/03/2020    History of Present Illness 83 female presenting with a month of headaches nausea vomiting dizziness and ataxia. Work-up showing large left cerebellar mass. 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 PMH including anxiety, anemia, colon cancer (treated with sx and radiation), DM type 2, DVT, and hypothyroidism.    PT Comments    Pt very motivated to ambulate today, very engaged in treatment and asking good questions about integrating her care for cancer, hypotension, DM, and psych issues. Pt with initial hypotension, able to ambulate after >15 mins of supine, sitting, and standing activity. Min A needed for all standing activity due to balance deficits. Pt ambulated 50' with RW and min A chair brought behind, 3 bouts. Continue to strongly recommend CIR for further rehab. BP sup 127/80 Sitting 124/87 Standing 1 min 80/65 Standing 3 mins 85/60 Standing 15 mins 119/87     Follow Up Recommendations  CIR     Equipment Recommendations  Other (comment) (defer to post acute)    Recommendations for Other Services Rehab consult     Precautions / Restrictions Precautions Precautions: Fall Precaution Comments: orthostatic hypotension Restrictions Weight Bearing Restrictions: No    Mobility  Bed Mobility Overal bed mobility: Needs Assistance Bed Mobility: Supine to Sit     Supine to sit: Supervision     General bed mobility comments: pt able to come slowly but safelt to EOB without physical assist  Transfers Overall transfer level: Needs assistance Equipment used: Rolling walker (2 wheeled) Transfers: Sit to/from Stand Sit to Stand: Min guard         General transfer comment: UE support needed as pt comes to standing, close guarding given  Ambulation/Gait Ambulation/Gait assistance: Min assist Gait Distance  (Feet): 50 Feet (3x) Assistive device: Rolling walker (2 wheeled) Gait Pattern/deviations: Step-to pattern;Decreased stride length;Narrow base of support Gait velocity: decreased Gait velocity interpretation: <1.31 ft/sec, indicative of household ambulator General Gait Details: took increased time performing supine, seated and standing exercises before BP increased enough to safely ambulate. Pt unsteady with ambulation as well as mildly dizzy so chair brought behind for safety and frequent rest breaks needed.   Stairs             Wheelchair Mobility    Modified Rankin (Stroke Patients Only)       Balance Overall balance assessment: Needs assistance Sitting-balance support: No upper extremity supported;Feet supported Sitting balance-Leahy Scale: Fair Sitting balance - Comments: can maintain static sitting without support   Standing balance support: Bilateral upper extremity supported;During functional activity;No upper extremity supported Standing balance-Leahy Scale: Poor Standing balance comment: Reliant on UE support and physical assist                            Cognition Arousal/Alertness: Awake/alert Behavior During Therapy: WFL for tasks assessed/performed Overall Cognitive Status: Impaired/Different from baseline Area of Impairment: Memory;Safety/judgement;Awareness;Problem solving                   Current Attention Level: Selective Memory: Decreased short-term memory Following Commands: Follows multi-step commands consistently;Follows multi-step commands with increased time Safety/Judgement: Decreased awareness of safety;Decreased awareness of deficits Awareness: Emergent Problem Solving: Slow processing;Difficulty sequencing;Requires verbal cues General Comments: pt asking good questions and showing improved executive level function. Delayed processing continues      Exercises General Exercises - Lower Extremity Ankle  Circles/Pumps:  AROM;Both;10 reps;Supine;Seated Quad Sets: AROM;Both;10 reps;Supine Long Arc Quad: AROM;Both;10 reps;Seated Hip ABduction/ADduction: AROM;Both;10 reps;Seated Hip Flexion/Marching: AROM;Both;15 reps;Seated;Standing;Supine Heel Raises: AROM;Both;15 reps;Standing Mini-Sqauts: AROM;5 reps;Standing    General Comments General comments (skin integrity, edema, etc.): supine BP 127/80, sit 124/87, stand 80/65 at 1 min, 85/60 at 3 mins, 119/87 at 15 mins      Pertinent Vitals/Pain Pain Assessment: Faces Faces Pain Scale: Hurts even more Pain Location: Neck Pain Descriptors / Indicators: Discomfort Pain Intervention(s): Limited activity within patient's tolerance;Monitored during session;Patient requesting pain meds-RN notified    Home Living                      Prior Function            PT Goals (current goals can now be found in the care plan section) Acute Rehab PT Goals Patient Stated Goal: Go home PT Goal Formulation: With patient Time For Goal Achievement: 06/13/20 Potential to Achieve Goals: Good Progress towards PT goals: Progressing toward goals    Frequency    Min 4X/week      PT Plan Current plan remains appropriate    Co-evaluation              AM-PAC PT "6 Clicks" Mobility   Outcome Measure  Help needed turning from your back to your side while in a flat bed without using bedrails?: A Little Help needed moving from lying on your back to sitting on the side of a flat bed without using bedrails?: A Little Help needed moving to and from a bed to a chair (including a wheelchair)?: A Little Help needed standing up from a chair using your arms (e.g., wheelchair or bedside chair)?: A Little Help needed to walk in hospital room?: A Lot Help needed climbing 3-5 steps with a railing? : A Lot 6 Click Score: 16    End of Session Equipment Utilized During Treatment: Gait belt Activity Tolerance: Patient tolerated treatment well Patient left: in  chair;with call bell/phone within reach;with chair alarm set Nurse Communication: Mobility status PT Visit Diagnosis: Unsteadiness on feet (R26.81);Other abnormalities of gait and mobility (R26.89);Muscle weakness (generalized) (M62.81);Repeated falls (R29.6)     Time: [id number] PT Time Calculation (min) (ACUTE ONLY): 44 min  Charges:  $Gait Training: 8-22 mins $Therapeutic Exercise: 8-22 mins $Therapeutic Activity: 8-22 mins                     Leighton Roach, PT  Acute Rehab Services  Pager [phone] Office [place] 06/03/2020, 2:00 PM

## 2020-06-04 ENCOUNTER — Inpatient Hospital Stay: Payer: PRIVATE HEALTH INSURANCE | Admitting: Oncology

## 2020-06-04 LAB — GLUCOSE, CAPILLARY
Glucose-Capillary: 165 mg/dL — ABNORMAL HIGH (ref 70–99)
Glucose-Capillary: 191 mg/dL — ABNORMAL HIGH (ref 70–99)
Glucose-Capillary: 209 mg/dL — ABNORMAL HIGH (ref 70–99)
Glucose-Capillary: 238 mg/dL — ABNORMAL HIGH (ref 70–99)
Glucose-Capillary: 268 mg/dL — ABNORMAL HIGH (ref 70–99)
Glucose-Capillary: 93 mg/dL (ref 70–99)

## 2020-06-04 MED ORDER — INSULIN GLARGINE 100 UNIT/ML ~~LOC~~ SOLN
20.0000 [IU] | Freq: Every day | SUBCUTANEOUS | Status: DC
  Administered 2020-06-05 – 2020-06-06 (×2): 20 [IU] via SUBCUTANEOUS
  Filled 2020-06-04 (×3): qty 0.2

## 2020-06-04 MED ORDER — INSULIN ASPART 100 UNIT/ML ~~LOC~~ SOLN
0.0000 [IU] | Freq: Three times a day (TID) | SUBCUTANEOUS | Status: DC
  Administered 2020-06-04: 2 [IU] via SUBCUTANEOUS
  Administered 2020-06-04 – 2020-06-05 (×2): 3 [IU] via SUBCUTANEOUS
  Administered 2020-06-05: 5 [IU] via SUBCUTANEOUS
  Administered 2020-06-05 – 2020-06-06 (×2): 3 [IU] via SUBCUTANEOUS
  Administered 2020-06-06: 5 [IU] via SUBCUTANEOUS

## 2020-06-04 MED ORDER — LIVING WELL WITH DIABETES BOOK
Freq: Once | Status: AC
  Filled 2020-06-04: qty 1

## 2020-06-04 NOTE — Plan of Care (Signed)

## 2020-06-04 NOTE — Progress Notes (Addendum)
Pt c/o "feeling funny, like my head spinning". Pt noted to be hypertensive. Will treat prn. Pt's BP retaken which was wnl, 7/10 pain in head, then prn pain admin.

## 2020-06-04 NOTE — Progress Notes (Addendum)
Inpatient Diabetes Program Recommendations  AACE/ADA: New Consensus Statement on Inpatient Glycemic Control (2015)  Target Ranges:  Prepandial:   less than 140 mg/dL      Peak postprandial:   less than 180 mg/dL (1-2 hours)      Critically ill patients:  140 - 180 mg/dL   Lab Results  Component Value Date   GLUCAP 93 06/04/2020   HGBA1C 9.7 (H) 06/02/2020    Review of Glycemic Control Results for Shannon [name], Shannon [name] (MRN [id number]) as of 06/04/2020 06:52  Ref. Range 06/03/2020 07:16 06/03/2020 11:19 06/03/2020 15:46 06/03/2020 20:53 06/03/2020 23:49 06/04/2020 03:11  Glucose-Capillary Latest Ref Range: 70 - 99 mg/dL 924 (H) 268 (H) 341 (H) 239 (H) 147 (H) 93   Diabetes history:  DM2 Outpatient Diabetes medications:  Lantus 45 units daily (Took 22 units at home on 12/29) Trulicity 1.5 mg every Monday Novolog 0-12 units TID Metformin 500 mg QAM & 1000 mg QPM Current orders for Inpatient glycemic control: Decadron 2 mg Q12H Novolog 0-9 units Q4H Novolog 6 units TID with meals Lantus 22 units daily Metformin 500 mg BID   Inpatient Diabetes Program Recommendations:     1- Decrease Lantus to 20 units daily  2-If patient is eating please consider: Novolog 0-15 units TID  Addendum@1440  Spoke with patient and mother at bedside.  She was happy to have consult because she has a lot of questions.  Reviewed patient's current A1c of 9.8% (average blood sugar of 234 mg/dL). Explained what a A1c is and what it measures. Also reviewed goal A1c with patient, importance of good glucose control @ home, and blood sugar goals.  We discussed CHO's at length and The Plate Method.  Discussed Importance of checking CBG's 3-4 times daily.  She usually checks 1-2 times daily.  Encouraged physical activity such as walks 15-20 mins 4-5 x a week.  She is interested in a CGM.  Will ask MD for permission to place prior to discharge.  She would like to see an endocrinologist after DC.  Will provide a list of local  endocrinologists in the area.  She denies frequent episodes of hypoglycemia but is aware of signs, symptoms and treatments.  Ordered LWWD booklet.  RN brought to bedside.     Will continue to follow while inpatient.  Thank you, Dulce Sellar, RN, BSN Diabetes Coordinator Inpatient Diabetes Program [phone] (team pager from 8a-5p)

## 2020-06-04 NOTE — Progress Notes (Signed)
Inpatient Rehab Admissions Coordinator:    Spoke to patient over phone to provide update.  Insurance auth still pending.  Will continue to follow.   Estill Dooms, PT, DPT Admissions Coordinator [phone] 06/04/20  2:35 PM

## 2020-06-04 NOTE — Progress Notes (Signed)
Physical Therapy Treatment Patient Details Name: Shannon [name] MRN: [id number] DOB: 11-12-1965 Today's Date: 06/04/2020    History of Present Illness 20 female presenting with a month of headaches nausea vomiting dizziness and ataxia. Work-up showing large left cerebellar mass. 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 PMH including anxiety, anemia, colon cancer (treated with sx and radiation), DM type 2, DVT, and hypothyroidism.    PT Comments    Pt feeling much better today, had mild dizziness while up but did not increase with activity or time up. Pt able to ambulate 200' with RW and min-guard A with chair brought behind. Mild LOB x2 with min A to correct and frequently bumping objects on R side. Without RW pt immediately loses balance and gait is ataxic with mod A needed. Continue to recommend CIR at d/c. PT will continue to follow.    Follow Up Recommendations  CIR     Equipment Recommendations  Other (comment) (defer to post acute)    Recommendations for Other Services Rehab consult     Precautions / Restrictions Precautions Precautions: Fall Precaution Comments: orthostatic hypotension Restrictions Weight Bearing Restrictions: No    Mobility  Bed Mobility Overal bed mobility: Needs Assistance Bed Mobility: Supine to Sit     Supine to sit: Supervision     General bed mobility comments: worked on gaze stabilization with bed mobility to decrease dizziness  Transfers Overall transfer level: Needs assistance Equipment used: Rolling walker (2 wheeled) Transfers: Sit to/from Stand Sit to Stand: Min guard         General transfer comment: UE support needed as pt comes to standing, close guarding given  Ambulation/Gait Ambulation/Gait assistance: Mod assist;Min guard Gait Distance (Feet): 200 Feet (+10' no AD) Assistive device: Rolling walker (2 wheeled);1 person hand held assist Gait Pattern/deviations: Decreased stride  length;Narrow base of support;Step-through pattern;Ataxic Gait velocity: decreased Gait velocity interpretation: <1.8 ft/sec, indicate of risk for recurrent falls General Gait Details: pt able to ambulate increased distance with RW with no change in dizziness (maintained a mild feeling of lightneadedness throughout). LOB x2 with min A to correct as well as frequently running into R sided obstacles.   Without RW, pt ataxic with mod A needed to stabilize and could not tolerate more than 10'   Stairs             Wheelchair Mobility    Modified Rankin (Stroke Patients Only)       Balance Overall balance assessment: Needs assistance Sitting-balance support: No upper extremity supported;Feet supported Sitting balance-Leahy Scale: Fair Sitting balance - Comments: can maintain static sitting without support   Standing balance support: Bilateral upper extremity supported;During functional activity;No upper extremity supported Standing balance-Leahy Scale: Poor Standing balance comment: Reliant on UE support and physical assist, immediate LOB without UE support                            Cognition Arousal/Alertness: Awake/alert Behavior During Therapy: WFL for tasks assessed/performed Overall Cognitive Status: Impaired/Different from baseline Area of Impairment: Memory;Safety/judgement;Awareness;Problem solving                   Current Attention Level: Selective Memory: Decreased short-term memory Following Commands: Follows multi-step commands consistently;Follows multi-step commands with increased time Safety/Judgement: Decreased awareness of safety;Decreased awareness of deficits Awareness: Emergent Problem Solving: Slow processing;Difficulty sequencing;Requires verbal cues General Comments: repetition of info helpful      Exercises General Exercises -  Lower Extremity Ankle Circles/Pumps: AROM;Both;10 reps;Supine;Seated Straight Leg Raises: AROM;Both;10  reps;Supine Hip Flexion/Marching: AROM;Both;15 reps;Seated;Standing Heel Raises: AROM;Both;15 reps;Standing    General Comments        Pertinent Vitals/Pain Pain Assessment: Faces Faces Pain Scale: Hurts a little bit Pain Location: Neck Pain Descriptors / Indicators: Discomfort Pain Intervention(s): Premedicated before session    Home Living                      Prior Function            PT Goals (current goals can now be found in the care plan section) Acute Rehab PT Goals Patient Stated Goal: Go home PT Goal Formulation: With patient Time For Goal Achievement: 06/13/20 Potential to Achieve Goals: Good Progress towards PT goals: Progressing toward goals    Frequency    Min 4X/week      PT Plan Current plan remains appropriate    Co-evaluation              AM-PAC PT "6 Clicks" Mobility   Outcome Measure  Help needed turning from your back to your side while in a flat bed without using bedrails?: A Little Help needed moving from lying on your back to sitting on the side of a flat bed without using bedrails?: A Little Help needed moving to and from a bed to a chair (including a wheelchair)?: A Little Help needed standing up from a chair using your arms (e.g., wheelchair or bedside chair)?: A Little Help needed to walk in hospital room?: A Lot Help needed climbing 3-5 steps with a railing? : A Lot 6 Click Score: 16    End of Session Equipment Utilized During Treatment: Gait belt Activity Tolerance: Patient tolerated treatment well Patient left: in chair;with call bell/phone within reach;with family/visitor present Nurse Communication: Mobility status PT Visit Diagnosis: Unsteadiness on feet (R26.81);Other abnormalities of gait and mobility (R26.89);Muscle weakness (generalized) (M62.81);Repeated falls (R29.6)     Time: 9798-9211 PT Time Calculation (min) (ACUTE ONLY): 33 min  Charges:  $Gait Training: 23-37 mins                      Lyanne Co, PT  Acute Rehab Services  Pager [phone] Office [phone]    Lawana Chambers Macaila Tahir 06/04/2020, 5:00 PM

## 2020-06-05 ENCOUNTER — Telehealth: Payer: Self-pay | Admitting: Psychiatry

## 2020-06-05 ENCOUNTER — Telehealth: Payer: Self-pay | Admitting: Radiation Therapy

## 2020-06-05 LAB — GLUCOSE, CAPILLARY
Glucose-Capillary: 185 mg/dL — ABNORMAL HIGH (ref 70–99)
Glucose-Capillary: 242 mg/dL — ABNORMAL HIGH (ref 70–99)
Glucose-Capillary: 247 mg/dL — ABNORMAL HIGH (ref 70–99)
Glucose-Capillary: 290 mg/dL — ABNORMAL HIGH (ref 70–99)
Glucose-Capillary: 241 mg/dL — ABNORMAL HIGH (ref 70–99)
Glucose-Capillary: 206 mg/dL — ABNORMAL HIGH (ref 70–99)

## 2020-06-05 MED ORDER — CHLORHEXIDINE GLUCONATE CLOTH 2 % EX PADS
6.0000 | MEDICATED_PAD | Freq: Every day | CUTANEOUS | Status: DC
  Administered 2020-06-05 – 2020-06-06 (×2): 6 via TOPICAL

## 2020-06-05 NOTE — Progress Notes (Signed)
Patient ID: Shannon [name], female   DOB: 1966/01/13, 54 y.o.   MRN: [id number] Patient doing well minimal headache improving with physical based therapy apparently insurance is approved rehab waiting for bed availability.

## 2020-06-05 NOTE — Progress Notes (Signed)
Inpatient Diabetes Program Recommendations  AACE/ADA: New Consensus Statement on Inpatient Glycemic Control (2015)  Target Ranges:  Prepandial:   less than 140 mg/dL      Peak postprandial:   less than 180 mg/dL (1-2 hours)      Critically ill patients:  140 - 180 mg/dL   Lab Results  Component Value Date   GLUCAP 247 (H) 06/05/2020   HGBA1C 9.7 (H) 06/02/2020    Review of Glycemic Control  Results for DANAKA, LLERA (MRN [id number]) as of 06/05/2020 16:14  Ref. Range 06/04/2020 03:11 06/04/2020 07:40 06/04/2020 11:13 06/04/2020 15:25 06/04/2020 19:47 06/04/2020 23:45 06/05/2020 03:27 06/05/2020 07:41 06/05/2020 12:31  Glucose-Capillary Latest Ref Range: 70 - 99 mg/dL 93 193 (H) 790 (H) 240 (H) 209 (H) 268 (H) 185 (H) 242 (H) 247 (H)   Diabetes history:  DM2 Outpatient Diabetes medications:  Lantus 45 units daily  Trulicity 1.5 mg every Monday Novolog 0-12 units TID Metformin 500 mg QAM & 1000 mg QPM Current orders for Inpatient glycemic control: Decadron 2 mg Q12H Novolog 0-9 units Q4H Novolog 6 units TID with meals Lantus 20 units daily Metformin 500 mg BID  Inpatient Diabetes Program Recommendations:    Lantus 22 units QAM Novolog 0-15 units TID Novolog 0-5 QHS  Will continue to follow while inpatient.  Thank you, Dulce Sellar, RN, BSN Diabetes Coordinator Inpatient Diabetes Program [phone] (team pager from 8a-5p)

## 2020-06-05 NOTE — Telephone Encounter (Signed)
Pt called to inform you she's been in the hospital since 12/29. She stated she had brain cancer removed. If you need to speak with her for further info she stated to please call her # [phone]. She's feeling great and has continued on the medication regimen  you prescribed.

## 2020-06-05 NOTE — Progress Notes (Signed)
Inpatient Rehab Admissions Coordinator:   I received insurance authorization for CIR, but I have no beds available for this patient to admit to CIR today.  Will continue to follow for timing of potential admission pending bed availability.  Tried to call pt to update but no answer an no option to leave a voicemail.  TOC aware.    Estill Dooms, PT, DPT Admissions Coordinator [phone] 06/05/20  10:55 AM

## 2020-06-05 NOTE — Progress Notes (Signed)
Occupational Therapy Treatment Patient Details Name: Shannon [name] MRN: [id number] DOB: 04/29/1966 Today's Date: 06/05/2020    History of present illness 53 female presenting with a month of headaches nausea vomiting dizziness and ataxia. Work-up showing large left cerebellar mass. 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 PMH including anxiety, anemia, colon cancer (treated with sx and radiation), DM type 2, DVT, and hypothyroidism.   OT comments  Pt sitting in recliner upon arrival, agreeable to OT session. Pt reported "tunnel vision" with progression from reclined to seated upright. She required minA for grooming while standing at sink level and minguard for functional mobility at RW level. Pt will continue to benefit from skilled OT services to maximize safety and independence with ADL/IADL and functional mobility. Will continue to follow acutely and progress as tolerated.   BP sitting 99/67 mmHg (reports of tunnel vision) BP standing 1 min: 89/49 mmHg BP standing 3 min: 83/57 mmHg (resolved tunnel vision) BP standing after activity: 87/102mmHg  Follow Up Recommendations  CIR    Equipment Recommendations  Other (comment) (Defer to next venue)    Recommendations for Other Services PT consult;Speech consult;Rehab consult    Precautions / Restrictions Precautions Precautions: Fall Precaution Comments: orthostatic hypotension Restrictions Weight Bearing Restrictions: No       Mobility Bed Mobility Overal bed mobility: Needs Assistance Bed Mobility: Supine to Sit     Supine to sit: Supervision     General bed mobility comments: pt sitting in recliner upon arrival  Transfers Overall transfer level: Needs assistance Equipment used: Rolling walker (2 wheeled) Transfers: Sit to/from Stand Sit to Stand: Min guard         General transfer comment: minguard for safety,    Balance Overall balance assessment: Needs  assistance Sitting-balance support: No upper extremity supported;Feet supported Sitting balance-Leahy Scale: Good Sitting balance - Comments: can maintain static sitting without support   Standing balance support: Single extremity supported;During functional activity Standing balance-Leahy Scale: Poor Standing balance comment: reliant on UE support, 3x minor loss of balance able to correct with BUE support, otherwise would need support from therapist                           ADL either performed or assessed with clinical judgement   ADL Overall ADL's : Needs assistance/impaired     Grooming: Wash/dry hands;Minimal assistance;Standing;Oral care;Applying deodorant Grooming Details (indicate cue type and reason): 3x minor loss of balance standing at sink, pt required assistance to correct             Lower Body Dressing: Min guard;Sit to/from stand   Toilet Transfer: Min guard;Ambulation;RW Toilet Transfer Details (indicate cue type and reason): minguard for safety at RW level Toileting- Clothing Manipulation and Hygiene: Min guard;Sit to/from stand       Functional mobility during ADLs: Minimal assistance;Rolling walker;Min guard General ADL Comments: pt with instability noted and 3x minor loss of balance was able to correct with assistance from BUE support on sink     Vision       Perception     Praxis      Cognition Arousal/Alertness: Awake/alert Behavior During Therapy: WFL for tasks assessed/performed Overall Cognitive Status: Within Functional Limits for tasks assessed Area of Impairment: Safety/judgement;Awareness;Problem solving                         Safety/Judgement: Decreased awareness of safety;Decreased awareness of deficits  Awareness: Emergent Problem Solving: Slow processing          Exercises     Shoulder Instructions       General Comments sitting BP: 94/62, attempted standing BP but pt reports orthostatic symptoms  and has to sit mid-way through BP recording. Final BP after ambulating of 94/60    Pertinent Vitals/ Pain       Pain Assessment: 0-10 Faces Pain Scale: Hurts little more Pain Location: neck Pain Descriptors / Indicators: Sore Pain Intervention(s): Monitored during session;Limited activity within patient's tolerance  Home Living                                          Prior Functioning/Environment              Frequency  Min 3X/week        Progress Toward Goals  OT Goals(current goals can now be found in the care plan section)  Progress towards OT goals: Progressing toward goals  Acute Rehab OT Goals Patient Stated Goal: to go to CIR OT Goal Formulation: With patient Time For Goal Achievement: 06/13/20 Potential to Achieve Goals: Good ADL Goals Pt Will Perform Grooming: with set-up;with supervision;sitting Pt Will Perform Upper Body Dressing: with set-up;with supervision;sitting Pt Will Perform Lower Body Dressing: with min guard assist;sit to/from stand Pt Will Transfer to Toilet: with min guard assist;bedside commode;ambulating Pt Will Perform Toileting - Clothing Manipulation and hygiene: sitting/lateral leans;sit to/from stand;with min guard assist Additional ADL Goal #1: Pt will performing three grooming tasks with 2-3 cues for recall Additional ADL Goal #2: Pt will demonstrate increased awareness to perform ADL with Min cues for safety  Plan Discharge plan remains appropriate    Co-evaluation                 AM-PAC OT "6 Clicks" Daily Activity     Outcome Measure   Help from another person eating meals?: A Little Help from another person taking care of personal grooming?: A Little Help from another person toileting, which includes using toliet, bedpan, or urinal?: A Little Help from another person bathing (including washing, rinsing, drying)?: A Little Help from another person to put on and taking off regular upper body clothing?:  A Little Help from another person to put on and taking off regular lower body clothing?: A Little 6 Click Score: 18    End of Session Equipment Utilized During Treatment: Rolling walker;Gait belt  OT Visit Diagnosis: Unsteadiness on feet (R26.81);Other abnormalities of gait and mobility (R26.89);Muscle weakness (generalized) (M62.81);Pain Pain - part of body:  (Neck)   Activity Tolerance Patient tolerated treatment well   Patient Left in chair;with call bell/phone within reach;with chair alarm set;with nursing/sitter in room   Nurse Communication Mobility status        Time: [id number] OT Time Calculation (min): 22 min  Charges: OT General Charges $OT Visit: 1 Visit OT Treatments $Self Care/Home Management : 8-22 mins  Helene Kelp OTR/L Acute Rehabilitation Services Office: [place] 06/05/2020, 3:40 PM

## 2020-06-05 NOTE — Progress Notes (Signed)
Physical Therapy Treatment Patient Details Name: Shannon [name] MRN: [id number] DOB: [birth year] Today's Date: 06/05/2020    History of Present Illness 45 female presenting with a month of headaches nausea vomiting dizziness and ataxia. Work-up showing large left cerebellar mass. 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 PMH including anxiety, anemia, colon cancer (treated with sx and radiation), DM type 2, DVT, and hypothyroidism.    PT Comments    Pt limited by reports of tunnel-vision with transitions from sitting to standing during session, unable to tolerate standing BP during session. Pt reports most of her dizzy symptoms happen with change in position but not necessarily with turning of head or body. PT continues to reinforce education on LE exercise and increased time between position changes to reduce BP variance when mobilizing. Pt remains unsteady when ambulating without UE support with reduced gait speed. Pt will continue to benefit from acute PT POC to reduce falls and aide in a return to independent mobility.   Follow Up Recommendations  CIR     Equipment Recommendations  Rolling walker with 5" wheels    Recommendations for Other Services       Precautions / Restrictions Precautions Precautions: Fall Precaution Comments: orthostatic hypotension Restrictions Weight Bearing Restrictions: No    Mobility  Bed Mobility Overal bed mobility: Needs Assistance Bed Mobility: Supine to Sit     Supine to sit: Supervision        Transfers Overall transfer level: Needs assistance Equipment used: Rolling walker (2 wheeled) Transfers: Sit to/from Stand Sit to Stand: Supervision         General transfer comment: UE support of RW needed for balance  Ambulation/Gait Ambulation/Gait assistance: Min assist (minG with RW) Gait Distance (Feet): 250 Feet (125' with RW, 125' without device) Assistive device: Rolling walker (2  wheeled) Gait Pattern/deviations: Step-to pattern Gait velocity: reduced Gait velocity interpretation: <1.8 ft/sec, indicate of risk for recurrent falls General Gait Details: short step-to gait, increased lateral sway without UE support. Pt does bump into one object on R side during session but does not consistently bump into objects on R   Stairs             Wheelchair Mobility    Modified Rankin (Stroke Patients Only)       Balance Overall balance assessment: Needs assistance Sitting-balance support: No upper extremity supported;Feet supported Sitting balance-Leahy Scale: Good     Standing balance support: Bilateral upper extremity supported;Single extremity supported Standing balance-Leahy Scale: Poor Standing balance comment: reliant on UE support or PT assist                            Cognition Arousal/Alertness: Awake/alert Behavior During Therapy: WFL for tasks assessed/performed Overall Cognitive Status: Impaired/Different from baseline Area of Impairment: Safety/judgement;Awareness;Problem solving                         Safety/Judgement: Decreased awareness of safety;Decreased awareness of deficits Awareness: Emergent Problem Solving: Slow processing        Exercises      General Comments General comments (skin integrity, edema, etc.): sitting BP: 94/62, attempted standing BP but pt reports orthostatic symptoms and has to sit mid-way through BP recording. Final BP after ambulating of 94/60      Pertinent Vitals/Pain Pain Assessment: Faces Faces Pain Scale: Hurts little more Pain Location: neck Pain Descriptors / Indicators: Sore Pain Intervention(s): Monitored during  session    Home Living                      Prior Function            PT Goals (current goals can now be found in the care plan section) Acute Rehab PT Goals Patient Stated Goal: Go home Progress towards PT goals: Progressing toward goals     Frequency    Min 4X/week      PT Plan Current plan remains appropriate    Co-evaluation              AM-PAC PT "6 Clicks" Mobility   Outcome Measure  Help needed turning from your back to your side while in a flat bed without using bedrails?: A Little Help needed moving from lying on your back to sitting on the side of a flat bed without using bedrails?: A Little Help needed moving to and from a bed to a chair (including a wheelchair)?: A Little Help needed standing up from a chair using your arms (e.g., wheelchair or bedside chair)?: A Little Help needed to walk in hospital room?: A Little Help needed climbing 3-5 steps with a railing? : A Lot 6 Click Score: 17    End of Session   Activity Tolerance: Treatment limited secondary to medical complications (Comment) (limited by orthostatic symptoms) Patient left: in chair;with call bell/phone within reach;with chair alarm set Nurse Communication: Mobility status PT Visit Diagnosis: Unsteadiness on feet (R26.81);Other abnormalities of gait and mobility (R26.89);Muscle weakness (generalized) (M62.81);Repeated falls (R29.6)     Time: 1231-1300 PT Time Calculation (min) (ACUTE ONLY): 29 min  Charges:  $Gait Training: 8-22 mins $Therapeutic Activity: 8-22 mins                     Zenaida Niece, PT, DPT Acute Rehabilitation Pager: [phone]    Zenaida Niece 06/05/2020, 1:19 PM

## 2020-06-05 NOTE — Telephone Encounter (Signed)
Spoke with the patient's mother, the patient did not answer her phone and the voicemail box was full. Her mother was appreciative for the call and that we are working on getting her daughter's Post OP radiation treatments coordinated. I shared our plan of getting an MRI on 1/11 @ 3:00, having Chanetta come to our department on 1/12 to meet with Dr. Mitzi Hansen and to sim, and then treatment on 1/13. The number of treatments is unknown at this time, could be one or three fractions, depending of the target volume. Ms. Mayford Knife said that she will communicate this to her daughter, and has my contact information should either of them have questions or concerns about what was discussed.    I also spoke with Estill Dooms, the Rehab Admission Coordinator, in order to coordinate the treatment plan with her rehab schedule next week.    Jalene Mullet R.T.(R)(T) Radiation Special Procedures Navigator

## 2020-06-06 ENCOUNTER — Inpatient Hospital Stay (HOSPITAL_COMMUNITY)
Admission: RE | Admit: 2020-06-06 | Discharge: 2020-06-15 | DRG: 949 | Disposition: A | Discharge status: Home / Self Care | Payer: PRIVATE HEALTH INSURANCE | Source: Intra-hospital | Attending: Physical Medicine & Rehabilitation | Admitting: Physical Medicine & Rehabilitation

## 2020-06-06 ENCOUNTER — Encounter (HOSPITAL_COMMUNITY): Payer: Self-pay | Admitting: Physical Medicine & Rehabilitation

## 2020-06-06 ENCOUNTER — Other Ambulatory Visit: Payer: Self-pay

## 2020-06-06 DIAGNOSIS — R519 Headache, unspecified: Secondary | ICD-10-CM | POA: Diagnosis present

## 2020-06-06 DIAGNOSIS — E669 Obesity, unspecified: Secondary | ICD-10-CM | POA: Diagnosis present

## 2020-06-06 DIAGNOSIS — E785 Hyperlipidemia, unspecified: Secondary | ICD-10-CM | POA: Diagnosis present

## 2020-06-06 DIAGNOSIS — E1169 Type 2 diabetes mellitus with other specified complication: Secondary | ICD-10-CM | POA: Diagnosis not present

## 2020-06-06 DIAGNOSIS — F32A Depression, unspecified: Secondary | ICD-10-CM | POA: Diagnosis present

## 2020-06-06 DIAGNOSIS — G4700 Insomnia, unspecified: Secondary | ICD-10-CM | POA: Diagnosis present

## 2020-06-06 DIAGNOSIS — F419 Anxiety disorder, unspecified: Secondary | ICD-10-CM | POA: Diagnosis present

## 2020-06-06 DIAGNOSIS — Z86718 Personal history of other venous thrombosis and embolism: Secondary | ICD-10-CM | POA: Diagnosis not present

## 2020-06-06 DIAGNOSIS — G441 Vascular headache, not elsewhere classified: Secondary | ICD-10-CM | POA: Diagnosis not present

## 2020-06-06 DIAGNOSIS — K219 Gastro-esophageal reflux disease without esophagitis: Secondary | ICD-10-CM | POA: Diagnosis present

## 2020-06-06 DIAGNOSIS — Z7984 Long term (current) use of oral hypoglycemic drugs: Secondary | ICD-10-CM

## 2020-06-06 DIAGNOSIS — F333 Major depressive disorder, recurrent, severe with psychotic symptoms: Secondary | ICD-10-CM

## 2020-06-06 DIAGNOSIS — E039 Hypothyroidism, unspecified: Secondary | ICD-10-CM | POA: Diagnosis present

## 2020-06-06 DIAGNOSIS — G9389 Other specified disorders of brain: Secondary | ICD-10-CM

## 2020-06-06 DIAGNOSIS — Z483 Aftercare following surgery for neoplasm: Principal | ICD-10-CM

## 2020-06-06 DIAGNOSIS — F429 Obsessive-compulsive disorder, unspecified: Secondary | ICD-10-CM | POA: Diagnosis present

## 2020-06-06 DIAGNOSIS — K5900 Constipation, unspecified: Secondary | ICD-10-CM | POA: Diagnosis present

## 2020-06-06 DIAGNOSIS — Z6831 Body mass index (BMI) 31.0-31.9, adult: Secondary | ICD-10-CM | POA: Diagnosis not present

## 2020-06-06 DIAGNOSIS — F422 Mixed obsessional thoughts and acts: Secondary | ICD-10-CM

## 2020-06-06 DIAGNOSIS — Z79899 Other long term (current) drug therapy: Secondary | ICD-10-CM

## 2020-06-06 DIAGNOSIS — E1165 Type 2 diabetes mellitus with hyperglycemia: Secondary | ICD-10-CM | POA: Diagnosis present

## 2020-06-06 DIAGNOSIS — C7931 Secondary malignant neoplasm of brain: Secondary | ICD-10-CM | POA: Diagnosis present

## 2020-06-06 DIAGNOSIS — Z794 Long term (current) use of insulin: Secondary | ICD-10-CM | POA: Diagnosis not present

## 2020-06-06 DIAGNOSIS — Z85038 Personal history of other malignant neoplasm of large intestine: Secondary | ICD-10-CM

## 2020-06-06 LAB — GLUCOSE, CAPILLARY
Glucose-Capillary: 179 mg/dL — ABNORMAL HIGH (ref 70–99)
Glucose-Capillary: 265 mg/dL — ABNORMAL HIGH (ref 70–99)
Glucose-Capillary: 218 mg/dL — ABNORMAL HIGH (ref 70–99)
Glucose-Capillary: 286 mg/dL — ABNORMAL HIGH (ref 70–99)
Glucose-Capillary: 303 mg/dL — ABNORMAL HIGH (ref 70–99)

## 2020-06-06 MED ORDER — BISACODYL 5 MG PO TBEC: 5.0000 mg | DELAYED_RELEASE_TABLET | Freq: Every day | ORAL | Status: DC | PRN

## 2020-06-06 MED ORDER — ONDANSETRON HCL 4 MG/2ML IJ SOLN
4.0000 mg | INTRAMUSCULAR | Status: DC | PRN
Start: 2020-06-06 — End: 2020-06-15

## 2020-06-06 MED ORDER — LAMOTRIGINE 150 MG PO TABS
150.0000 mg | ORAL_TABLET | Freq: Every day | ORAL | Status: DC
  Administered 2020-06-06 – 2020-06-14 (×9): 150 mg via ORAL
  Filled 2020-06-06 (×9): qty 1

## 2020-06-06 MED ORDER — DEXAMETHASONE 2 MG PO TABS: 2.0000 mg | ORAL_TABLET | Freq: Every day | ORAL | 0 refills | Status: DC

## 2020-06-06 MED ORDER — SORBITOL 70 % SOLN
15.0000 mL | Freq: Every day | Status: DC | PRN
  Administered 2020-06-08: 15 mL via ORAL
  Filled 2020-06-06: qty 30

## 2020-06-06 MED ORDER — INSULIN ASPART 100 UNIT/ML ~~LOC~~ SOLN
0.0000 [IU] | Freq: Three times a day (TID) | SUBCUTANEOUS | Status: DC
  Administered 2020-06-06 – 2020-06-07 (×2): 5 [IU] via SUBCUTANEOUS
  Administered 2020-06-07: 2 [IU] via SUBCUTANEOUS
  Administered 2020-06-07: 3 [IU] via SUBCUTANEOUS
  Administered 2020-06-08: 5 [IU] via SUBCUTANEOUS
  Administered 2020-06-08: 2 [IU] via SUBCUTANEOUS
  Administered 2020-06-08: 3 [IU] via SUBCUTANEOUS
  Administered 2020-06-09: 2 [IU] via SUBCUTANEOUS
  Administered 2020-06-09: 3 [IU] via SUBCUTANEOUS
  Administered 2020-06-09: 7 [IU] via SUBCUTANEOUS
  Administered 2020-06-10: 9 [IU] via SUBCUTANEOUS
  Administered 2020-06-10: 2 [IU] via SUBCUTANEOUS
  Administered 2020-06-11: 3 [IU] via SUBCUTANEOUS
  Administered 2020-06-11: 2 [IU] via SUBCUTANEOUS
  Administered 2020-06-11: 7 [IU] via SUBCUTANEOUS
  Administered 2020-06-12: 5 [IU] via SUBCUTANEOUS
  Administered 2020-06-12: 2 [IU] via SUBCUTANEOUS
  Administered 2020-06-12 – 2020-06-13 (×2): 1 [IU] via SUBCUTANEOUS
  Administered 2020-06-13: 2 [IU] via SUBCUTANEOUS
  Administered 2020-06-13: 7 [IU] via SUBCUTANEOUS
  Administered 2020-06-14: 2 [IU] via SUBCUTANEOUS
  Administered 2020-06-14: 1 [IU] via SUBCUTANEOUS
  Administered 2020-06-14 – 2020-06-15 (×2): 5 [IU] via SUBCUTANEOUS

## 2020-06-06 MED ORDER — INSULIN GLARGINE 100 UNIT/ML ~~LOC~~ SOLN
20.0000 [IU] | Freq: Every day | SUBCUTANEOUS | Status: DC
  Administered 2020-06-07 – 2020-06-09 (×3): 20 [IU] via SUBCUTANEOUS
  Filled 2020-06-06 (×3): qty 0.2

## 2020-06-06 MED ORDER — INSULIN ASPART 100 UNIT/ML ~~LOC~~ SOLN
6.0000 [IU] | Freq: Three times a day (TID) | SUBCUTANEOUS | Status: DC
  Administered 2020-06-06 – 2020-06-13 (×20): 6 [IU] via SUBCUTANEOUS

## 2020-06-06 MED ORDER — ONDANSETRON HCL 4 MG PO TABS: 4.0000 mg | ORAL_TABLET | ORAL | Status: DC | PRN

## 2020-06-06 MED ORDER — LEVOTHYROXINE SODIUM 75 MCG PO TABS
75.0000 ug | ORAL_TABLET | Freq: Every day | ORAL | Status: DC
  Administered 2020-06-07 – 2020-06-15 (×9): 75 ug via ORAL
  Filled 2020-06-06 (×9): qty 1

## 2020-06-06 MED ORDER — FLUVOXAMINE MALEATE 100 MG PO TABS
300.0000 mg | ORAL_TABLET | Freq: Every day | ORAL | Status: DC
  Administered 2020-06-06 – 2020-06-14 (×9): 300 mg via ORAL
  Filled 2020-06-06 (×9): qty 3

## 2020-06-06 MED ORDER — OXYCODONE HCL 5 MG PO TABS
5.0000 mg | ORAL_TABLET | ORAL | Status: DC | PRN
  Administered 2020-06-06 – 2020-06-09 (×3): 10 mg via ORAL
  Administered 2020-06-10 – 2020-06-11 (×3): 5 mg via ORAL
  Administered 2020-06-12 – 2020-06-15 (×6): 10 mg via ORAL
  Filled 2020-06-06: qty 1
  Filled 2020-06-06: qty 2
  Filled 2020-06-06 (×2): qty 1
  Filled 2020-06-06 (×8): qty 2

## 2020-06-06 MED ORDER — DOCUSATE SODIUM 100 MG PO CAPS
100.0000 mg | ORAL_CAPSULE | Freq: Two times a day (BID) | ORAL | Status: DC
  Administered 2020-06-06 – 2020-06-11 (×11): 100 mg via ORAL
  Filled 2020-06-06 (×11): qty 1

## 2020-06-06 MED ORDER — METFORMIN HCL 500 MG PO TABS
500.0000 mg | ORAL_TABLET | Freq: Two times a day (BID) | ORAL | Status: DC
  Administered 2020-06-06 – 2020-06-15 (×18): 500 mg via ORAL
  Filled 2020-06-06 (×18): qty 1

## 2020-06-06 MED ORDER — QUETIAPINE FUMARATE 200 MG PO TABS
800.0000 mg | ORAL_TABLET | Freq: Every day | ORAL | Status: DC
  Administered 2020-06-06 – 2020-06-14 (×9): 800 mg via ORAL
  Filled 2020-06-06 (×10): qty 4

## 2020-06-06 MED ORDER — SORBITOL 70 % SOLN
30.0000 mL | Freq: Once | Status: DC
  Filled 2020-06-06 (×5): qty 30

## 2020-06-06 MED ORDER — POLYETHYLENE GLYCOL 3350 17 G PO PACK: 17.0000 g | PACK | Freq: Every day | ORAL | Status: DC | PRN

## 2020-06-06 MED ORDER — SENNOSIDES-DOCUSATE SODIUM 8.6-50 MG PO TABS
1.0000 | ORAL_TABLET | Freq: Every day | ORAL | Status: DC
  Administered 2020-06-06 – 2020-06-11 (×6): 1 via ORAL
  Filled 2020-06-06 (×6): qty 1

## 2020-06-06 MED ORDER — ATORVASTATIN CALCIUM 10 MG PO TABS
10.0000 mg | ORAL_TABLET | Freq: Every day | ORAL | Status: DC
  Administered 2020-06-06 – 2020-06-14 (×9): 10 mg via ORAL
  Filled 2020-06-06 (×9): qty 1

## 2020-06-06 MED ORDER — PANTOPRAZOLE SODIUM 40 MG PO TBEC
40.0000 mg | DELAYED_RELEASE_TABLET | Freq: Every day | ORAL | Status: DC
  Administered 2020-06-06 – 2020-06-14 (×9): 40 mg via ORAL
  Filled 2020-06-06 (×10): qty 1

## 2020-06-06 MED ORDER — DEXAMETHASONE 2 MG PO TABS
2.0000 mg | ORAL_TABLET | Freq: Two times a day (BID) | ORAL | Status: DC
  Administered 2020-06-06 – 2020-06-10 (×8): 2 mg via ORAL
  Filled 2020-06-06 (×8): qty 1

## 2020-06-06 MED ORDER — ACETAMINOPHEN 325 MG PO TABS
650.0000 mg | ORAL_TABLET | Freq: Four times a day (QID) | ORAL | Status: DC | PRN
  Administered 2020-06-07 – 2020-06-15 (×9): 650 mg via ORAL
  Filled 2020-06-06 (×9): qty 2

## 2020-06-06 NOTE — Progress Notes (Signed)
Patient arrived on unit, oriented to unit. Reviewed medications, therapy schedule, rehab routine and plan of care. States an understanding of information reviewed. No complications noted at this time. Patient reports no pain and is AX4 Shannon [name] [facility]  

## 2020-06-06 NOTE — H&P (Addendum)
Physical Medicine and Rehabilitation Admission H&P     HPI: Shannon [name] is a 55 year old right-handed female with history of anxiety/depression maintained on Seroquel 800 mg nightly as well as Valium as needed and Lamictal, OCD maintained on Luvox type 2 diabetes mellitus, DVT, obesity with BMI 31.32, colon cancer 2018 with resection followed by chemotherapy.  Per chart review patient lives with her 38 year old daughter independent prior to admission and working from home.  Her mother has been assisting with care.  Presented 05/29/2020 with 1 month history of headache nausea vomiting and dizziness with diplopia as well as multiple falls.  Work-up with x-ray and imaging showed a large left cerebellar mass.  Underwent suboccipital craniectomy for resection of left cerebellar mass 05/29/2020 per Dr. Wynetta Emery.  Maintained on Decadron protocol.  Tolerating a regular consistency diet.  Physical medicine rehabilitation was consulted to assess candidacy for CIR given impairment in cognition and mobility.  Patient was admitted for a comprehensive rehab program.  \ Pt reports she's had Orthostatic hypotension with PT and needs meds for this. Also, has "BPPV" was told by PT she had it and needs treatment.  Also hasn't had a BM- LBM was Sunday 5 days ago- so asking for meds to go.   Radiation starts 1/13, MRI on 1/11 and has Dr Mitzi Hansen appointment, etc on 1/12.   Having HA's so bad since surgery, that has been taking PO and IV pain meds, including dilaudid- wants PO Dilaudid if possible.    Review of Systems  Constitutional: Negative for chills and fever.  HENT: Negative for hearing loss.   Eyes: Positive for blurred vision.  Respiratory: Positive for shortness of breath. Negative for cough.   Cardiovascular: Negative for chest pain, palpitations and leg swelling.  Gastrointestinal: Positive for constipation. Negative for heartburn, nausea and vomiting.       GERD  Genitourinary: Negative for  dysuria, flank pain and hematuria.  Musculoskeletal: Positive for joint pain.  Skin: Negative for rash.  Neurological: Positive for dizziness, tingling and weakness.  Psychiatric/Behavioral: Positive for depression. The patient has insomnia.        Anxiety, OCD  All other systems reviewed and are negative.  Past Medical History:  Diagnosis Date  . Anemia    Iron De  . Anxiety   . Blood clot in vein   . Bowel obstruction (HCC)   . Colon cancer (HCC) 2018   treated with surgery and radiation  . Depression   . Diabetes mellitus without complication (HCC)    Type II  . DVT (deep venous thrombosis) (HCC) 2019   behind right knee- while she wasa on chemo  . GERD (gastroesophageal reflux disease)   . History of blood transfusion   . History of chemotherapy   . History of kidney stones 2012   passed  . Hypothyroidism   . Obesity    Past Surgical History:  Procedure Laterality Date  . ABDOMINAL HYSTERECTOMY  05/2019   total laparoscopin with tubes and ovariers removed also.  . APPENDECTOMY  05/2019  . COLON SURGERY  03/2017   Colon resection  . COLONOSCOPY  2019  . SUBOCCIPITAL CRANIECTOMY CERVICAL LAMINECTOMY Left 05/29/2020   Procedure: Suboccipital Craniectomy for Resection of Left Cerebellar Mass;  Surgeon: Donalee Citrin, MD;  Location: [facility] OR;  Service: Neurosurgery;  Laterality: Left;   Family History  Problem Relation Age of Onset  . Irritable bowel syndrome Mother   . Colon polyps Father   . Breast cancer Maternal  Grandmother   . Diabetes Maternal Grandmother   . Diabetes Maternal Grandfather   . Breast cancer Paternal Grandmother   . Colon polyps Maternal Uncle   . Stomach cancer Neg Hx   . Pancreatic cancer Neg Hx   . Esophageal cancer Neg Hx   . Colon cancer Neg Hx    Social History:  reports that she has never smoked. She has never used smokeless tobacco. She reports current alcohol use. She reports that she does not use drugs. Allergies:  Allergies  Allergen  Reactions  . Clindamycin/Lincomycin Rash  . Penicillins Rash    Reaction: 10 years   Medications Prior to Admission  Medication Sig Dispense Refill  . acetaminophen (TYLENOL) 500 MG tablet Take 500-1,000 mg by mouth every 6 (six) hours as needed for moderate pain or mild pain.    Marland Kitchen atorvastatin (LIPITOR) 10 MG tablet Take 10 mg by mouth at bedtime.  0  . dexamethasone (DECADRON) 2 MG tablet Take 1 tablet (2 mg total) by mouth daily. 20 tablet 0  . dexamethasone (DECADRON) 4 MG tablet Take 1 tablet (4 mg total) by mouth 3 (three) times daily. 90 tablet 0  . diazepam (VALIUM) 2 MG tablet Take 2 mg by mouth 3 (three) times daily as needed.    . Dulaglutide 1.5 MG/0.5ML SOPN Inject 1.5 mg into the skin once a week. Monday    . fluvoxaMINE (LUVOX) 100 MG tablet Take 3 tablets (300 mg total) by mouth at bedtime. 270 tablet 1  . hyoscyamine (LEVSIN SL) 0.125 MG SL tablet Place 1 tablet (0.125 mg total) under the tongue every 6 (six) hours as needed. 45 tablet 1  . insulin aspart (NOVOLOG) 100 UNIT/ML FlexPen Inject 0-12 Units into the skin 3 (three) times daily with meals. Sliding Scale    . lamoTRIgine (LAMICTAL) 150 MG tablet Take 1 tablet (150 mg total) by mouth daily. (Patient taking differently: No sig reported) 90 tablet 3  . LANTUS SOLOSTAR 100 UNIT/ML Solostar Pen Inject 45 Units into the skin daily. Every AM    . levothyroxine (SYNTHROID) 75 MCG tablet Take 75 mcg by mouth daily.    Marland Kitchen LORazepam (ATIVAN) 0.5 MG tablet Take 0.5 mg by mouth every 6 (six) hours as needed for anxiety. .5mg  or 1mg     . meclizine (ANTIVERT) 25 MG tablet Take 25 mg by mouth daily as needed for dizziness.    . metFORMIN (GLUCOPHAGE) 500 MG tablet Take 500-1,000 mg by mouth See admin instructions. 500 mg in the AM 1000 mg tablets in the PM    . ondansetron (ZOFRAN) 4 MG tablet Take 1 tablet (4 mg total) by mouth every 8 (eight) hours as needed for nausea or vomiting. 30 tablet 1  . promethazine (PHENERGAN) 25 MG  tablet Take 25 mg by mouth 2 (two) times daily as needed for nausea/vomiting.    . SEROQUEL 400 MG tablet TAKE 2 TABLETS (800 MG TOTAL) BY MOUTH AT BEDTIME. BRAND MEDICALLY NECESSARY 180 tablet 5    Drug Regimen Review Drug regimen was reviewed and remains appropriate with no significant issues identified  Home: Home Living Family/patient expects to be discharged to:: Private residence Living Arrangements: Parent,Children   Functional History:    Functional Status:  Mobility:          ADL:    Cognition: Cognition Orientation Level: Oriented X4    Physical Exam: Blood pressure 115/82, pulse 85, temperature 98.1 F (36.7 C), temperature source Oral, resp. rate 16, height 5'  8" (1.727 m), last menstrual period 02/22/2019, SpO2 94 %. Physical Exam Vitals and nursing note reviewed. Exam conducted with a chaperone present.  Constitutional:      Comments: Awake, alert, sitting up in bedside chair, appropriate, mother at bedside, NAD, c/o neck soreness- HA has improved as of this moment  HENT:     Head:     Comments: Craniotomy site clean and dry Suboccipital vertical incision with sutures intact- is clean and dry, but does have very localized erythema. No signs of infection.     Right Ear: External ear normal.     Left Ear: External ear normal.     Nose: Nose normal. No congestion.     Mouth/Throat:     Mouth: Mucous membranes are moist.     Pharynx: Oropharynx is clear. No oropharyngeal exudate.  Eyes:     General: No scleral icterus.       Right eye: No discharge.        Left eye: No discharge.     Extraocular Movements: Extraocular movements intact.  Neck:     Comments: TTP over posterior neck near incision- muscles also tight in splenius capitus B/L Cardiovascular:     Rate and Rhythm: Normal rate and regular rhythm.     Heart sounds: Normal heart sounds. No murmur heard.   Pulmonary:     Comments: CTA B/L- no W/R/R- good air movement Abdominal:      Comments: Soft, but appears somewhat distended, NT, hypoactive BS  Musculoskeletal:     Comments: UEs 5/5 B/L in biceps, triceps, WE, grip and finger abd B/L LEs- HF 5-/5 B/L; otherwise 5/5 in KE, KF, DF and PF B/L  Skin:    Comments: IVs in arms- AC fossa- look OK  Neurological:     Comments: Patient is alert.  Speech is slow but deliberate.  Provides her name and age.  Follows simple commands.  Pt appropriate, asking complex questions Ox3; finger ot nose has searching pattern L>R Rapid alternating movements slowed B/L, but intact Intact to light touch in all 4 extremities     Results for orders placed or performed during the hospital encounter of 06/06/20 (from the past 48 hour(s))  Glucose, capillary     Status: Abnormal   Collection Time: 06/06/20  4:24 PM  Result Value Ref Range   Glucose-Capillary 286 (H) 70 - 99 mg/dL    Comment: Glucose reference range applies only to samples taken after fasting for at least 8 hours.   No results found.     Medical Problem List and Plan: 1.  Dizziness with blurred vision and decreased functional mobility secondary to left cerebellar intra-axial lesion, likely metastatic with history of colon cancer 2018.  Status post suboccipital craniectomy resection of cerebellar mass 05/29/2020.  Decadron tapered  -patient may  Shower, but try to cover suboccipital crani site  -ELOS/Goals: 7-10 days mod I 2.  Antithrombotics: -DVT/anticoagulation: SCDs  -antiplatelet therapy: N/A 3. Pain Management: Oxycodone as needed- Of note, pt is specifically asking for PO dilaudid  4. Mood: Luvox 300 mg nightly, Lamictal 150 mg daily  -antipsychotic agents: Seroquel 800 mg nightly 5. Neuropsych: This patient is capable of making decisions on her own behalf. 6. Skin/Wound Care: Routine skin checks- sutures out after 14 days if OK with NSU 7. Fluids/Electrolytes/Nutrition: Routine in and outs with follow-up chemistries 8.  Diabetes mellitus.  Hemoglobin A1c  9.6.  Glucophage 500 mg twice daily, NovoLog 6 units 3 times daily, Lantus insulin 20  units daily.  Check blood sugars before meals and at bedtime 9.  Hypothyroidism.  Synthroid 10.  Hyperlipidemia.  Lipitor 11.  GERD.  Protonix 12.  Obesity.  BMI 31.32.  Dietary follow-up 13.  OCD.  Continue Luvox 14. BPPV? Will see if pt can be evaluated for it 15. Orthostatic hypotension? Might benefit from Midodrine- pt reports BP with PT as low as [id number] systolic, but didn't see that documented in chart-  16. Constipation- LBM 5 days ago- will add miralax prn and give sorbitol tonight.  17. Treatment of L cerebellar mass - appointment for MRI 1/11, Dr Lisbeth Renshaw appt 1/12, and Radiation to start 1/13- pt voiced would like to leave 1/14 at the earliest.    Nelva Nay, PA-C 06/06/2020   I have personally performed a face to face diagnostic evaluation of this patient and formulated the key components of the plan.  Additionally, I have personally reviewed laboratory data, imaging studies, as well as relevant notes and concur with the physician assistant's documentation above.   The patient's status has not changed from the original H&P.  Any changes in documentation from the acute care chart have been noted above.      Courtney Heys, MD 06/06/2020

## 2020-06-06 NOTE — Progress Notes (Signed)
Physical Therapy Treatment Patient Details Name: Shannon [name] MRN: [id number] DOB: Jun 07, 1965 Today's Date: 06/06/2020    History of Present Illness 76 female presenting with a month of headaches nausea vomiting dizziness and ataxia. Work-up showing large left cerebellar mass. 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 PMH including anxiety, anemia, colon cancer (treated with sx and radiation), DM type 2, DVT, and hypothyroidism.    PT Comments    Pt continues to demonstrate improvement in ambulation quality in balance, although continuing to drift toward R side when mobilizing. Pt demonstrates improved recognition of R drift and is able to self correct later in this session. Pt will continue to benefit from aggressive gait and balance training to improve mobility and to restore independence. Pt will continue to benefit from acute therapies during admission.  Follow Up Recommendations  CIR     Equipment Recommendations  None recommended by PT    Recommendations for Other Services       Precautions / Restrictions Precautions Precautions: Fall Precaution Comments: orthostatic hypotension Restrictions Weight Bearing Restrictions: No    Mobility  Bed Mobility Overal bed mobility: Needs Assistance Bed Mobility: Supine to Sit     Supine to sit: Supervision;HOB elevated        Transfers Overall transfer level: Needs assistance Equipment used: Rolling walker (2 wheeled) Transfers: Sit to/from Stand Sit to Stand: Supervision            Ambulation/Gait Ambulation/Gait assistance: Min assist Gait Distance (Feet): 300 Feet Assistive device: Rolling walker (2 wheeled);None Gait Pattern/deviations: Step-through pattern Gait velocity: reduced Gait velocity interpretation: <1.8 ft/sec, indicate of risk for recurrent falls General Gait Details: pt initially ambulating with use of RW, does tend to drift toward R side, bumps into  hospital bed in hallway. Without RW pt demonstrates reduced gait speed, is able to perform head turns, change direction, and perform turns. Pt also is able to change step length to accomodate for obstacles.   Stairs             Wheelchair Mobility    Modified Rankin (Stroke Patients Only)       Balance Overall balance assessment: Needs assistance Sitting-balance support: No upper extremity supported;Feet supported Sitting balance-Leahy Scale: Good Sitting balance - Comments: can maintain static sitting without support   Standing balance support: No upper extremity supported;During functional activity Standing balance-Leahy Scale: Fair                              Cognition Arousal/Alertness: Awake/alert Behavior During Therapy: WFL for tasks assessed/performed Overall Cognitive Status: Within Functional Limits for tasks assessed                                        Exercises      General Comments General comments (skin integrity, edema, etc.): pt denies symptoms of orthostasis or changes in vision this session      Pertinent Vitals/Pain Pain Assessment: No/denies pain    Home Living                      Prior Function            PT Goals (current goals can now be found in the care plan section) Acute Rehab PT Goals Patient Stated Goal: to go to CIR Progress towards PT  goals: Progressing toward goals    Frequency    Min 4X/week      PT Plan Current plan remains appropriate    Co-evaluation              AM-PAC PT "6 Clicks" Mobility   Outcome Measure  Help needed turning from your back to your side while in a flat bed without using bedrails?: A Little Help needed moving from lying on your back to sitting on the side of a flat bed without using bedrails?: A Little Help needed moving to and from a bed to a chair (including a wheelchair)?: A Little Help needed standing up from a chair using your arms  (e.g., wheelchair or bedside chair)?: A Little Help needed to walk in hospital room?: A Little Help needed climbing 3-5 steps with a railing? : A Little 6 Click Score: 18    End of Session Equipment Utilized During Treatment: Gait belt Activity Tolerance: Patient tolerated treatment well Patient left: in chair;with call bell/phone within reach;with chair alarm set;with family/visitor present Nurse Communication: Mobility status PT Visit Diagnosis: Unsteadiness on feet (R26.81);Other abnormalities of gait and mobility (R26.89);Muscle weakness (generalized) (M62.81);Repeated falls (R29.6)     Time: [id number] PT Time Calculation (min) (ACUTE ONLY): 15 min  Charges:  $Gait Training: 8-22 mins                     Zenaida Niece, PT, DPT Acute Rehabilitation Pager: [phone]    Zenaida Niece 06/06/2020, 1:38 PM

## 2020-06-06 NOTE — Progress Notes (Signed)
Inpatient Rehabilitation Medication Review by a Pharmacist  A complete drug regimen review was completed for this patient to identify any potential clinically significant medication issues.  Clinically significant medication issues were identified:  no  Check AMION for pharmacist assigned to patient if future medication questions/issues arise during this admission.  Pharmacist comments: Several discrepancies between discharge summary (meds just copied from home med rec). Previous hospital medication continued appropriately.  Time spent performing this drug regimen review (minutes):   Elisia Stepp S. Merilynn Finland, PharmD, BCPS Clinical Staff Pharmacist [email or web address] Pasty Spillers 06/06/2020 2:50 PM

## 2020-06-06 NOTE — Progress Notes (Signed)
NEUROSURGERY PROGRESS NOTE  Doing well. Complains of appropriate neck soreness. Awaiting rehab bed. Continue therapy today  Temp:  [97.3 F (36.3 C)-98.3 F (36.8 C)] 97.8 F (36.6 C) (01/06 0735) Pulse Rate:  [77-99] 80 (01/06 0735) Resp:  [14-18] 16 (01/06 0735) BP: (91-131)/(61-78) 106/62 (01/06 0735) SpO2:  [96 %-98 %] 96 % (01/06 0735)  Sherryl Manges, NP 06/06/2020 8:04 AM

## 2020-06-06 NOTE — Progress Notes (Signed)
Inpatient Rehab Admissions Coordinator:    I have insurance approval and a bed available for pt to admit to CIR today. Leo Grosser, NP, in agreement.  Will let pt/family and TOC team know.   Estill Dooms, PT, DPT Admissions Coordinator [phone] 06/06/20  10:12 AM

## 2020-06-06 NOTE — Progress Notes (Signed)
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 Reatha Armour   HPI: Shannon [name] is a 55 y.o. female with history of T2DM, DVT, colon cancer one month history of HA, N/V with dizziness, diplopia, multiple falls with work up revealing large left cerebellar mass. She was admitted on 05/29/20 for suboccipital crani for tumor resection by Dr. Saintclair Halsted. Post op therapy evaluations revealed weakness, ataxia,scissoring gait, lacks safety awareness with affect her mobility and ability to perform ADLs.  She was independent prior a month ago and mother has been assisting with ADLs with WC being used for mobility. Physical Medicine & Rehabilitation was consulted to assess candidacy for CIR given impaired cognition and mobility. Currently has blurry vision and shortness of breath.    Review of Systems  Constitutional: Negative for chills and fever.  HENT: Negative for hearing loss and tinnitus.   Eyes: Positive for blurred vision (on left) and double vision. Negative for photophobia.  Respiratory: Positive for shortness of breath (with minimal activity).   Cardiovascular: Negative for chest pain, palpitations and leg swelling.  Gastrointestinal: Negative for heartburn and nausea.  Musculoskeletal: Negative for joint pain and neck pain.  Skin: Negative for rash.  Neurological: Positive for dizziness (worse in the past month), tingling (bilateral feet due to diabetes), weakness and  headaches.  Psychiatric/Behavioral: Positive for depression. The patient is nervous/anxious and has insomnia.          Past Medical History:  Diagnosis Date  . Anemia    Iron De  . Anxiety   . Blood clot in vein   . Bowel obstruction ([place])   . Colon cancer ([place]) 2018   treated with surgery and radiation  . Depression   . Diabetes mellitus without complication ([place])    Type II  . DVT (deep venous thrombosis) ([place]) 2019   behind right knee- while she wasa on chemo  . GERD (gastroesophageal reflux disease)   . History of blood transfusion   . History of chemotherapy   . History of kidney stones 2012   passed  . Hypothyroidism   . Obesity          Past Surgical History:  Procedure Laterality Date  . ABDOMINAL HYSTERECTOMY  05/2019   total laparoscopin with tubes and ovariers removed also.  . APPENDECTOMY  05/2019  . COLON SURGERY  03/2017   Colon resection  . COLONOSCOPY  2019  . SUBOCCIPITAL CRANIECTOMY CERVICAL LAMINECTOMY Left 05/29/2020   Procedure: Suboccipital Craniectomy for Resection of Left Cerebellar Mass;  Surgeon: Kary Kos, MD;  Location: [facility];  Service: Neurosurgery;  Laterality: Left;         Family History  Problem Relation Age of Onset  . Irritable bowel syndrome Mother   . Colon polyps Father   . Breast cancer Maternal Grandmother   . Diabetes Maternal Grandmother   . Diabetes Maternal Grandfather   . Breast cancer Paternal Grandmother   . Colon polyps  Maternal Uncle   . Stomach cancer Neg Hx   . Pancreatic cancer Neg Hx   . Esophageal cancer Neg Hx   . Colon cancer Neg Hx     Social History: Lives with 14 year old daughter. 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  Mother helps out 3 days a week to help with home/meals? Has been WC bound for past 3-4 weeks with mother assisting. She reports that she has never smoked. She has never used  smokeless tobacco. She reports current alcohol use. She reports that she does not use drugs.        Allergies  Allergen Reactions  . Clindamycin/Lincomycin Rash  . Penicillins Rash    Reaction: 10 years          Medications Prior to Admission  Medication Sig Dispense Refill  . acetaminophen (TYLENOL) 500 MG tablet Take 500-1,000 mg by mouth every 6 (six) hours as needed for moderate pain or mild pain.    Marland Kitchen atorvastatin (LIPITOR) 10 MG tablet Take 10 mg by mouth at bedtime.  0  . diazepam (VALIUM) 2 MG tablet Take 2 mg by mouth 3 (three) times daily as needed.    . Dulaglutide 1.5 MG/0.5ML SOPN Inject 1.5 mg into the skin once a week. Monday    . fluvoxaMINE (LUVOX) 100 MG tablet Take 3 tablets (300 mg total) by mouth at bedtime. 270 tablet 1  . hyoscyamine (LEVSIN SL) 0.125 MG SL tablet Place 1 tablet (0.125 mg total) under the tongue every 6 (six) hours as needed. 45 tablet 1  . insulin aspart (NOVOLOG) 100 UNIT/ML FlexPen Inject 0-12 Units into the skin 3 (three) times daily with meals. Sliding Scale    . lamoTRIgine (LAMICTAL) 150 MG tablet Take 1 tablet (150 mg total) by mouth daily. (Patient taking differently: No sig reported) 90 tablet 3  . LANTUS SOLOSTAR 100 UNIT/ML Solostar Pen Inject 45 Units into the skin daily. Every AM    . levothyroxine (SYNTHROID) 75 MCG tablet Take 75 mcg by mouth daily.    Marland Kitchen LORazepam (ATIVAN) 0.5 MG tablet Take 0.5 mg by mouth every 6 (six) hours as needed for anxiety. .5mg  or 1mg     . meclizine (ANTIVERT) 25 MG tablet Take 25 mg by mouth daily as needed for dizziness.    . metFORMIN (GLUCOPHAGE) 500 MG tablet Take 500-1,000 mg by mouth See admin instructions. 500 mg in the AM 1000 mg tablets in the PM    . ondansetron (ZOFRAN) 4 MG tablet Take 1 tablet (4 mg total) by mouth every 8 (eight) hours as needed for nausea or vomiting. 30 tablet 1  . promethazine (PHENERGAN) 25 MG tablet Take 25 mg by mouth 2 (two) times daily as  needed for nausea/vomiting.    . SEROQUEL 400 MG tablet TAKE 2 TABLETS (800 MG TOTAL) BY MOUTH AT BEDTIME. BRAND MEDICALLY NECESSARY 180 tablet 5  . dexamethasone (DECADRON) 4 MG tablet Take 1 tablet (4 mg total) by mouth 3 (three) times daily. 90 tablet 0  . metroNIDAZOLE (FLAGYL) 250 MG tablet Take 1 tablet (250 mg total) by mouth 3 (three) times daily. (Patient not taking: No sig reported) 42 tablet 0    Home: Home Living Family/patient expects to be discharged to:: Private residence Living Arrangements: Parent,Children (mother and 85 yo daughter) Available Help at Discharge: Family,Available 24 hours/day Type of Home: House Home Access: Stairs to enter,Level entry Entrance Stairs-Number of Steps:  5-6 at front; and 2-3 steps in back Home Layout: One level Bathroom Shower/Tub: Walk-in shower,Tub/shower unit [employer]: Standard Home Equipment: Shower seat - built in,Hand held shower head,Wheelchair - Administrator, arts - 2 wheels Additional Comments: mother been staying past few weeks. two falls since decline in Nov  Functional History: Prior Function Level of Independence: Needs assistance Gait / Transfers Assistance Needed: Use of w/c ADL's / Homemaking Assistance Needed: Performing BADLs Comments: Recently decline since Nov 24 where she couldnt get around without help. 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 Cueing pt to exit bed on right side and pt ecioting bed to left Transfers Overall transfer level: Needs assistance Equipment used: Rolling walker (2 wheeled) Transfers: Sit to/from Stand Sit to Stand: Min assist General transfer comment: minA to steady in stance, posterior lean with poor insight to instability Ambulation/Gait Ambulation/Gait  assistance: Min assist,Mod assist Gait Distance (Feet): 30 Feet Assistive device: Rolling walker (2 wheeled) Gait Pattern/deviations: Step-through pattern,Decreased stride length,Scissoring,Ataxic,Leaning posteriorly,Narrow base of support General Gait Details: slow gait with narrow steps, intermittent scissoring steps with strong posterior lean and minimal self-correction. minA to steady with moments of modA needed due to posterior lean or instability Gait velocity: decreased  ADL: ADL Overall ADL's : Needs assistance/impaired Eating/Feeding: Set up,Sitting Grooming: Wash/dry hands,Minimal assistance,Standing Upper Body Bathing: Minimal assistance,Sitting Lower Body Bathing: Moderate assistance,Sit to/from stand Upper Body Dressing : Minimal assistance,Sitting Lower Body Dressing: Minimal assistance,Sit to/from stand Lower Body Dressing Details (indicate cue type and reason): Min A for coorindation in donning socks over toes. Min A for additional support in sitting Toilet Transfer: Minimal assistance,+2 for safety/equipment,Ambulation,Regular Toilet,Grab bars Toilet Transfer Details (indicate cue type and reason): Min A for safety Toileting- Clothing Manipulation and Hygiene: Min guard,Sitting/lateral lean Functional mobility during ADLs: Minimal assistance,Moderate assistance,Rolling walker General ADL Comments: Pt presenting with decreased coorination, cognition, nad balance imapcting her safe performance of ADLs  Cognition: Cognition Overall Cognitive Status: Impaired/Different from baseline Orientation Level: Oriented X4 Cognition Arousal/Alertness: Awake/alert Behavior During Therapy: Impulsive Overall Cognitive Status: Impaired/Different from baseline Area of Impairment: Attention,Memory,Following commands,Safety/judgement,Awareness,Problem solving,Orientation Orientation Level: Disoriented to,Time Current Attention Level: Sustained Memory: Decreased short-term  memory Following Commands: Follows one step commands with increased time,Follows multi-step commands inconsistently,Follows multi-step commands with increased time Safety/Judgement: Decreased awareness of safety,Decreased awareness of deficits Awareness: Emergent,Intellectual Problem Solving: Slow processing,Difficulty sequencing,Requires verbal cues General Comments: Pt very agreeable to therapy and motivated. Decreased awareness and memory. inconsistent information provided when collecting home information and PLOF. Also presenting wiht impulsivity and poor safety awareness.   Blood pressure (!) 124/59, pulse 73, temperature 98.4 F (36.9 C), temperature source Oral, resp. rate 10, height 5\' 8"  (1.727 m), weight 93.4 kg, last menstrual period 02/22/2019, SpO2 95 %. Physical Exam General: Alert, No apparent distress HEENT: Head is normocephalic, Posterior neck incision with honeycomb dressing.  Neck: Supple without JVD or lymphadenopathy Heart: Reg rate and rhythm. No murmurs rubs or gallops Chest: CTA bilaterally without wheezes, rales, or rhonchi; no distress Abdomen: Soft, non-tender, non-distended, bowel sounds positive. Extremities: No clubbing, cyanosis, or edema. Pulses are 2+ Skin: Clean and intact without signs of breakdown Neuro: Speech slow but clear. She reports blurred vision on the left "bad eye" with diplopia. Tends to close left eye for finger to nose. Was able to ambulate with NT  to BR but developed presyncope, posterior bias and vertigo requiring 2+ assist. 5/5 strength throughout. Psych: Pt's affect is appropriate. Pt is cooperative   Lab Results Last 24 Hours       Results for orders placed or performed during the hospital encounter of 05/29/20 (from the past 24 hour(s))  Glucose, capillary     Status: Abnormal   Collection Time: 05/30/20 11:43 AM  Result Value Ref Range   Glucose-Capillary 253 (H) 70 - 99 mg/dL  Glucose, capillary     Status: Abnormal    Collection Time: 05/30/20  4:10 PM  Result Value Ref Range   Glucose-Capillary 357 (H) 70 - 99 mg/dL  Glucose, capillary     Status: Abnormal   Collection Time: 05/30/20 10:00 PM  Result Value Ref Range   Glucose-Capillary 365 (H) 70 - 99 mg/dL  Glucose, capillary     Status: Abnormal   Collection Time: 05/31/20  7:17 AM  Result Value Ref Range   Glucose-Capillary 271 (H) 70 - 99 mg/dL      Imaging Results (Last 48 hours)  MR BRAIN W WO CONTRAST  Result Date: 05/30/2020 CLINICAL DATA:  Brain mass or lesion. Status post craniotomy for brain mass. EXAM: MRI HEAD WITHOUT AND WITH CONTRAST TECHNIQUE: Multiplanar, multiecho pulse sequences of the brain and surrounding structures were obtained without and with intravenous contrast. CONTRAST:  60mL GADAVIST GADOBUTROL 1 MMOL/ML IV SOLN COMPARISON:  MRI May 27, 2020. FINDINGS: Brain: Interval resection of the previously described left cerebellar mass. Evaluation is limited by the iron throughout the left cerebellum from prior Feraheme injection. Post-contrast is imaging is particularly limited within these limitations, no specific evidence of residual tumor. T2 hypointense area in the anterior aspect of the resection cavity is favored to represent an air bubble. There is expected blood products within the resection cavity and expected devitalized tissue. Redemonstrated left cerebellar edema with T1 hyperintensity from Feraheme staining. Decreased mass effect and decreased effacement of the fourth ventricle. The ventricular system remains mildly dilated, but this is slightly improved from the prior. Mild periventricular edema. No midline shift. Basal cisterns are patent. Vascular: Major arterial flow voids are maintained at the skull base. Skull and upper cervical spine: Left occipital craniectomy. Heterogeneous marrow signal without evidence of focal marrow replacing lesion. Sinuses/Orbits: Mild paranasal sinus mucosal thickening without  air-fluid levels. Unremarkable orbits. Other: Trace bilateral mastoid effusions. IMPRESSION: 1. 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 Evaluation is limited by iron throughout the left cerebellum from prior Feraheme injection. Post-contrast is imaging is particularly limited. Within these limitations, no specific evidence of residual tumor. T2 hypointense area in the anterior aspect of the resection cavity is favored to represent an air bubble, but warrants attention on follow. 2. Redemonstrated left cerebellar edema with decreased mass effect and decreased effacement of the fourth ventricle. The ventricular system remains dilated, but this is slightly improved from the prior. Similar mild periventricular edema. Electronically Signed   By: Feliberto Harts MD   On: 05/30/2020 10:38      Assessment/Plan: Diagnosis: Left cerebellar intra-axial lesion, likely metastatic 1. Does the need for close, 24 hr/day medical supervision in concert with the patient's rehab needs make it unreasonable for this patient to be served in a less intensive setting? Yes 2. Co-Morbidities requiring supervision/potential complications: 1. Headache- consider addition of Topamax 38md daily. 2. Incisional pain: consider transitioning from IV to po dilaudid.  3. Ataxic gait: will require intensive PT and OT 4. Impaired safety awareness: will  require intensive SLP 3. Due to bladder management, bowel management, safety, skin/wound care, disease management, medication administration, pain management and patient education, does the patient require 24 hr/day rehab nursing? Yes 4. Does the patient require coordinated care of a physician, rehab nurse, therapy disciplines of PT, OT, SLP to address physical and functional deficits in the context of the above medical diagnosis(es)? Yes Addressing deficits in the following areas: balance, endurance, locomotion, strength, transferring, bowel/bladder control, bathing, dressing,  feeding, grooming, toileting, cognition and psychosocial support 5. Can the patient actively participate in an intensive therapy program of at least 3 hrs of therapy per day at least 5 days per week? Yes 6. The potential for patient to make measurable gains while on inpatient rehab is excellent 7. Anticipated functional outcomes upon discharge from inpatient rehab are supervision  with PT, supervision with OT, supervision with SLP. 8. Estimated rehab length of stay to reach the above functional goals is: 7-10 days 9. Anticipated discharge destination: Home 10. Overall Rehab/Functional Prognosis: excellent  RECOMMENDATIONS: This patient's condition is appropriate for continued rehabilitative care in the following setting: CIR Patient has agreed to participate in recommended program. Yes Note that insurance prior authorization may be required for reimbursement for recommended care.  Comment: Thank you for this consult. Admission coordinator to follow.   I have personally performed a face to face diagnostic evaluation, including, but not limited to relevant history and physical exam findings, of this patient and developed relevant assessment and plan.  Additionally, I have reviewed and concur with the physician assistant's documentation above.  Leeroy Cha, MD  Bary Leriche, PA-C 05/31/2020      Revision History                                  Routing History              Note Details  Author Izora Ribas, MD File Time 05/31/2020 2:00 PM  Author Type Physician Status Addendum  Last Editor Izora Ribas, MD Service Physical Medicine and [facility] # [id number] Admit Date 06/06/2020

## 2020-06-06 NOTE — TOC Transition Note (Signed)
Transition of Care [facility]) - CM/SW Discharge Note   Patient Details  Name: Maurisa Tesmer MRN: [id number] Date of Birth: [birth year]  [facility] CM/SW Contact:  Kermit Balo, RN Phone Number: 06/06/2020, 11:55 AM   Clinical Narrative:    Pt is discharging to CIR today. CM signing off.   Final next level of care: IP Rehab Facility Barriers to Discharge: No Barriers Identified   Patient Goals and CMS Choice        Discharge Placement                       Discharge Plan and Services                                     Social Determinants of Health (SDOH) Interventions     Readmission Risk Interventions No flowsheet data found.

## 2020-06-06 NOTE — Progress Notes (Signed)
Inpatient Rehabilitation  Patient information reviewed and entered into eRehab system by Marisha Renier M. Landin Tallon, M.A., CCC/SLP, PPS Coordinator.  Information including medical coding, functional ability and quality indicators will be reviewed and updated through discharge.    

## 2020-06-06 NOTE — Discharge Summary (Signed)
Physician Discharge Summary  Patient ID: Shannon [name] MRN: [id number] DOB/AGE: Jun 06, 1965 55 y.o.  Admit date: 05/29/2020 Discharge date: 06/06/2020  Admission Diagnoses: left cerebellar metatisis    Discharge Diagnoses: same   Discharged Condition: good  Hospital Course: The patient was admitted on 05/29/2020 and taken to the operating room where the patient underwent suboccipital craniectomy for resection of left cerebellar mass. The patient tolerated the procedure well and was taken to the recovery room and then to the ICU in stable condition. The hospital course was routine. There were no complications. The wound remained clean dry and intact. Pt had appropriate neck and head soreness. No complaints of arm pain or new N/T/W. The patient remained afebrile with stable vital signs, and tolerated a regular diet. The patient continued to increase activities, and pain was well controlled with oral pain medications.   Consults: None  Significant Diagnostic Studies:    DG Chest 2 View  Result Date: 06/03/2020 CLINICAL DATA:  Cough. EXAM: CHEST - 2 VIEW COMPARISON:  03/25/2017 FINDINGS: Cardiac silhouette is normal in size. No mediastinal or hilar masses or evidence of adenopathy. Linear opacities project in the anterior lung base on the lateral view consistent with scarring. Lungs otherwise clear. No pleural effusion or pneumothorax. Right anterior chest wall Port-A-Cath is stable. Skeletal structures are intact. IMPRESSION: No active cardiopulmonary disease. Electronically Signed   By: Amie Portland M.D.   On: 06/03/2020 20:04   MR BRAIN W WO CONTRAST  Result Date: 05/30/2020 CLINICAL DATA:  Brain mass or lesion. Status post craniotomy for brain mass. EXAM: MRI HEAD WITHOUT AND WITH CONTRAST TECHNIQUE: Multiplanar, multiecho pulse sequences of the brain and surrounding structures were obtained without and with intravenous contrast. CONTRAST:  12mL GADAVIST GADOBUTROL 1 MMOL/ML IV  SOLN COMPARISON:  MRI May 27, 2020. FINDINGS: Brain: Interval resection of the previously described left cerebellar mass. Evaluation is limited by the iron throughout the left cerebellum from prior Feraheme injection. Post-contrast is imaging is particularly limited within these limitations, no specific evidence of residual tumor. T2 hypointense area in the anterior aspect of the resection cavity is favored to represent an air bubble. There is expected blood products within the resection cavity and expected devitalized tissue. Redemonstrated left cerebellar edema with T1 hyperintensity from Feraheme staining. Decreased mass effect and decreased effacement of the fourth ventricle. The ventricular system remains mildly dilated, but this is slightly improved from the prior. Mild periventricular edema. No midline shift. Basal cisterns are patent. Vascular: Major arterial flow voids are maintained at the skull base. Skull and upper cervical spine: Left occipital craniectomy. Heterogeneous marrow signal without evidence of focal marrow replacing lesion. Sinuses/Orbits: Mild paranasal sinus mucosal thickening without air-fluid levels. Unremarkable orbits. Other: Trace bilateral mastoid effusions. IMPRESSION: 1. 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 Evaluation is limited by iron throughout the left cerebellum from prior Feraheme injection. Post-contrast is imaging is particularly limited. Within these limitations, no specific evidence of residual tumor. T2 hypointense area in the anterior aspect of the resection cavity is favored to represent an air bubble, but warrants attention on follow. 2. Redemonstrated left cerebellar edema with decreased mass effect and decreased effacement of the fourth ventricle. The ventricular system remains dilated, but this is slightly improved from the prior. Similar mild periventricular edema. Electronically Signed   By: Feliberto Harts MD   On: 05/30/2020 10:38     Antibiotics:  Anti-infectives (From admission, onward)   Start     Dose/Rate Route Frequency Ordered  Stop   05/29/20 1800  vancomycin (VANCOCIN) IVPB 1000 mg/200 mL premix        1,000 mg 200 mL/hr over 60 Minutes Intravenous Every 8 hours 05/29/20 1420 05/30/20 0205   05/29/20 0800  vancomycin (VANCOCIN) IVPB 1000 mg/200 mL premix  Status:  Discontinued        1,000 mg 200 mL/hr over 60 Minutes Intravenous On call to O.R. 05/29/20 0756 05/29/20 1405      Discharge Exam: Blood pressure 106/62, pulse 80, temperature 97.8 F (36.6 C), temperature source Oral, resp. rate 16, height 5\' 8"  (1.727 m), weight 93.4 kg, last menstrual period 02/22/2019, SpO2 96 %. Neurologic: Grossly normal Ambulating and voiding well, incision cdi   Discharge Medications:   Allergies as of 06/06/2020      Reactions   Clindamycin/lincomycin Rash   Penicillins Rash   Reaction: 10 years      Medication List    STOP taking these medications   metroNIDAZOLE 250 MG tablet Commonly known as: FLAGYL     TAKE these medications   acetaminophen 500 MG tablet Commonly known as: TYLENOL Take 500-1,000 mg by mouth every 6 (six) hours as needed for moderate pain or mild pain.   atorvastatin 10 MG tablet Commonly known as: LIPITOR Take 10 mg by mouth at bedtime.   dexamethasone 4 MG tablet Commonly known as: DECADRON Take 1 tablet (4 mg total) by mouth 3 (three) times daily. What changed: Another medication with the same name was added. Make sure you understand how and when to take each.   dexamethasone 2 MG tablet Commonly known as: DECADRON Take 1 tablet (2 mg total) by mouth daily. What changed: You were already taking a medication with the same name, and this prescription was added. Make sure you understand how and when to take each.   diazepam 2 MG tablet Commonly known as: VALIUM Take 2 mg by mouth 3 (three) times daily as needed.   Dulaglutide 1.5 MG/0.5ML Sopn Inject 1.5 mg into the skin  once a week. Monday   fluvoxaMINE 100 MG tablet Commonly known as: LUVOX Take 3 tablets (300 mg total) by mouth at bedtime.   hyoscyamine 0.125 MG SL tablet Commonly known as: LEVSIN SL Place 1 tablet (0.125 mg total) under the tongue every 6 (six) hours as needed.   insulin aspart 100 UNIT/ML FlexPen Commonly known as: NOVOLOG Inject 0-12 Units into the skin 3 (three) times daily with meals. Sliding Scale   lamoTRIgine 150 MG tablet Commonly known as: LAMICTAL Take 1 tablet (150 mg total) by mouth daily. What changed: when to take this   Lantus SoloStar 100 UNIT/ML Solostar Pen Generic drug: insulin glargine Inject 45 Units into the skin daily. Every AM   levothyroxine 75 MCG tablet Commonly known as: SYNTHROID Take 75 mcg by mouth daily.   LORazepam 0.5 MG tablet Commonly known as: ATIVAN Take 0.5 mg by mouth every 6 (six) hours as needed for anxiety. .5mg  or 1mg    meclizine 25 MG tablet Commonly known as: ANTIVERT Take 25 mg by mouth daily as needed for dizziness.   metFORMIN 500 MG tablet Commonly known as: GLUCOPHAGE Take 500-1,000 mg by mouth See admin instructions. 500 mg in the AM 1000 mg tablets in the PM   ondansetron 4 MG tablet Commonly known as: ZOFRAN Take 1 tablet (4 mg total) by mouth every 8 (eight) hours as needed for nausea or vomiting.   promethazine 25 MG tablet Commonly known as: PHENERGAN Take 25  mg by mouth 2 (two) times daily as needed for nausea/vomiting.   SEROquel 400 MG tablet Generic drug: QUEtiapine TAKE 2 TABLETS (800 MG TOTAL) BY MOUTH AT BEDTIME. BRAND MEDICALLY NECESSARY       Disposition: CIR   Final Dx: suboccipital craniectomy for left cerebellar mass  Discharge Instructions    Call MD for:  difficulty breathing, headache or visual disturbances   Complete by: As directed    Call MD for:  hives   Complete by: As directed    Call MD for:  persistant dizziness or light-headedness   Complete by: As directed    Call MD  for:  persistant nausea and vomiting   Complete by: As directed    Call MD for:  redness, tenderness, or signs of infection (pain, swelling, redness, odor or green/yellow discharge around incision site)   Complete by: As directed    Call MD for:  severe uncontrolled pain   Complete by: As directed    Diet - low sodium heart healthy   Complete by: As directed    Increase activity slowly   Complete by: As directed    No wound care   Complete by: As directed          Signed: Tiana Loft Undra Harriman 06/06/2020, 10:22 AM

## 2020-06-06 NOTE — Progress Notes (Addendum)
PMR Admission Coordinator Pre-Admission Assessment   Patient: Shannon [name] is an 55 y.o., female MRN: [id number] DOB: [birth year] Height: _0  (172.7 cm) Weight: 93.4 kg                                                                                                                                                  Insurance Information HMO:     PPO: yes     PCP:      IPA:      80/20:      OTHER:  PRIMARY: [employer]      Policy#: [id number]      Subscriber: patient CM Name: Vaughan Browner       Phone#:  [phone] ext [phone]    Fax#: [phone] Pre-Cert#: [id number] [place] for CIR provided by Vaughan Browner with [employer] with updates due to fax listed above on 1/11      Employer:  Benefits:  Phone #:      Name:  Eff. Date: 06/01/13     Deduct: $750 ($0 met)      Out of Pocket Max: $3500 ($0 met)      Life Max: n/a  CIR: 80%       SNF: 80% Outpatient: 80%     Co-Ins: 20% Home Health: 80%      Co-Ins: 20% DME: 80%     Co-Ins: 20% Providers: in-network SECONDARY:       Policy#:       Phone#:    Development worker, community:       Phone#:    The Engineer, petroleum" for patients in Inpatient Rehabilitation Facilities with attached "Privacy Act [place] Records" was provided and verbally reviewed with: N/A   Emergency Contact Information         Contact Information     Name Relation Home Work Mobile    Turner,Janet Mother [phone]   [phone]       Current Medical History  Patient Admitting Diagnosis: L cerebellar metastasis resection   History of Present Illness: Pt is a 55 y.o. female with history of T2DM, DVT, colon cancer one month history of HA, N/V with dizziness, diplopia, multiple falls with work up revealing large left cerebellar mass. She was admitted on 05/29/20 for suboccipital crani for tumor resection by Dr. Saintclair Halsted. Post op therapy evaluations revealed weakness, ataxia,scissoring gait, lacks safety awareness with affect her mobility and ability to perform ADLs.   She was independent prior a month ago and mother has been assisting with ADLs with WC being used for mobility. Physical Medicine & Rehabilitation was consulted to assess candidacy for CIR given impaired cognition and mobility.    Pt will be undergoing radiation while on rehab.  Schedule is below.  Social Work team and therapy team to coordinate schedules to ensure pt can maximize rehab potential and begin radiation.  1/11: MRI scheduled in the PM at [facility]   1/12: 830 AM nurse eval, 9AM MD consult, 1015 IV simulation, 11AM radiation simulation   1/13: 2PM MD consult, 315 Radiation   1/17 and 1/19 at 215 Radiation Glasgow Coma Scale Score: 15   Past Medical History      Past Medical History:  Diagnosis Date  . Anemia      Iron De  . Anxiety    . Blood clot in vein    . Bowel obstruction ([place])    . Colon cancer ([place]) 2018    treated with surgery and radiation  . Depression    . Diabetes mellitus without complication ([place])      Type II  . DVT (deep venous thrombosis) ([place]) 2019    behind right knee- while she wasa on chemo  . GERD (gastroesophageal reflux disease)    . History of blood transfusion    . History of chemotherapy    . History of kidney stones 2012    passed  . Hypothyroidism    . Obesity        Family History  family history includes Breast cancer in her maternal grandmother and paternal grandmother; Colon polyps in her father and maternal uncle; Diabetes in her maternal grandfather and maternal grandmother; Irritable bowel syndrome in her mother.   Prior Rehab/Hospitalizations:  Has the patient had prior rehab or hospitalizations prior to admission? No   Has the patient had major surgery during 100 days prior to admission? Yes   Current Medications    Current Facility-Administered Medications:  .  0.9 % NaCl with KCl 20 mEq/ L  infusion, , Intravenous, Continuous, Eustace Moore, MD, Stopped at 06/02/20 1003 .  acetaminophen (TYLENOL) tablet 650  mg, 650 mg, Oral, Q6H PRN, Meyran, Ocie Cornfield, NP .  atorvastatin (LIPITOR) tablet 10 mg, 10 mg, Oral, QHS, Meyran, Ocie Cornfield, NP, 10 mg at 06/02/20 2143 .  bisacodyl (DULCOLAX) EC tablet 5 mg, 5 mg, Oral, Daily PRN, Eustace Moore, MD, 5 mg at 06/02/20 1327 .  Chlorhexidine Gluconate Cloth 2 % PADS 6 each, 6 each, Topical, Daily, Kary Kos, MD, 6 each at 06/03/20 0831 .  dexamethasone (DECADRON) tablet 2 mg, 2 mg, Oral, Q12H, Eustace Moore, MD, 2 mg at 06/03/20 0830 .  docusate sodium (COLACE) capsule 100 mg, 100 mg, Oral, BID, Kary Kos, MD, 100 mg at 06/03/20 0830 .  fluvoxaMINE (LUVOX) tablet 300 mg, 300 mg, Oral, QHS, Meyran, Ocie Cornfield, NP, 300 mg at 06/02/20 2143 .  HYDROmorphone (DILAUDID) injection 0.5-1 mg, 0.5-1 mg, Intravenous, Q2H PRN, Kary Kos, MD, 1 mg at 06/01/20 1553 .  insulin aspart (novoLOG) injection 0-9 Units, 0-9 Units, Subcutaneous, Q4H, Erline Levine, MD, 2 Units at 06/03/20 1238 .  insulin aspart (novoLOG) injection 6 Units, 6 Units, Subcutaneous, TID WC, Erline Levine, MD, 6 Units at 06/03/20 1237 .  insulin glargine (LANTUS) injection 22 Units, 22 Units, Subcutaneous, Daily, Meyran, Ocie Cornfield, NP, 22 Units at 06/03/20 0831 .  labetalol (NORMODYNE) injection 10-40 mg, 10-40 mg, Intravenous, Q10 min PRN, Kary Kos, MD .  lamoTRIgine (LAMICTAL) tablet 150 mg, 150 mg, Oral, QHS, Meyran, Ocie Cornfield, NP, 150 mg at 06/02/20 2143 .  levothyroxine (SYNTHROID) tablet 75 mcg, 75 mcg, Oral, Q0200, Meyran, Ocie Cornfield, NP, 75 mcg at 06/03/20 0509 .  metFORMIN (GLUCOPHAGE) tablet 500 mg, 500 mg, Oral, BID WC, Erline Levine, MD, 500 mg at 06/03/20 0830 .  ondansetron (ZOFRAN) tablet  4 mg, 4 mg, Oral, Q4H PRN **OR** ondansetron (ZOFRAN) injection 4 mg, 4 mg, Intravenous, Q4H PRN, Kary Kos, MD .  pantoprazole (PROTONIX) EC tablet 40 mg, 40 mg, Oral, QHS, [place], Yujing Z, RPH, 40 mg at 06/02/20 2143 .  promethazine (PHENERGAN) tablet 12.5-25 mg,  12.5-25 mg, Oral, Q4H PRN, Kary Kos, MD .  QUEtiapine (SEROQUEL) tablet 800 mg, 800 mg, Oral, QHS, Meyran, Ocie Cornfield, NP, 800 mg at 06/02/20 2149 .  senna-docusate (Senokot-S) tablet 1 tablet, 1 tablet, Oral, QHS, Eustace Moore, MD   Patients Current Diet:     Diet Order                      Diet Carb Modified Fluid consistency: Thin; Room service appropriate? Yes with Assist  Diet effective now                      Precautions / Restrictions Precautions Precautions: Fall Precaution Comments: orthostatic hypotension Restrictions Weight Bearing Restrictions: No    Has the patient had 2 or more falls or a fall with injury in the past year?Yes   Prior Activity Level Limited Community (1-2x/wk): 2-3x/week prior to 11/24. Post 11/24 pt leaves house 1x or less   Prior Functional Level Prior Function Level of Independence: Needs assistance Gait / Transfers Assistance Needed: Use of w/c ADL's / Homemaking Assistance Needed: Performing BADLs Comments: Recently decline since Nov 24 where she couldnt get around without help. 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  Needed some help   Indoor Mobility: Did the patient need assistance with walking from room to room (with or without device)? Pt has been using a wheel chair for the past month.  Pt noted she used walker since hospital admission   Stairs: Did the patient need assistance with internal or external stairs (with or without device)? Independent   Functional Cognition: Did the patient need help planning regular tasks such as shopping or remembering to take medications? Needed some help   Home Assistive Devices / Equipment Home Assistive Devices/Equipment: Investment banker, operational (specify quad or straight),Eyeglasses,CBG Meter Home Equipment: Shower seat - built in,Hand held shower head,Wheelchair - manual,Shower  seat,Walker - 2 wheels   Prior Device Use: Indicate devices/aids used by the patient prior to current illness, exacerbation or injury? Manual wheelchair   Current Functional Level Cognition   Overall Cognitive Status: Impaired/Different from baseline Current Attention Level: Selective Orientation Level: Oriented X4 Following Commands: Follows multi-step commands consistently,Follows multi-step commands with increased time Safety/Judgement: Decreased awareness of safety,Decreased awareness of deficits General Comments: pt asking good questions and showing improved executive level function. Delayed processing continues    Extremity Assessment (includes Sensation/Coordination)   Upper Extremity Assessment: Defer to OT evaluation  Lower Extremity Assessment: Generalized weakness,RLE deficits/detail,LLE deficits/detail RLE Coordination: decreased fine motor,decreased gross motor LLE Coordination: decreased fine motor,decreased gross motor     ADLs   Overall ADL's : Needs assistance/impaired Eating/Feeding: Set up,Sitting Grooming: Wash/dry hands,Minimal assistance,Standing Upper Body Bathing: Minimal assistance,Sitting Lower Body Bathing: Moderate assistance,Sit to/from stand Upper Body Dressing : Minimal assistance,Sitting Lower Body Dressing: Minimal assistance,Sit to/from stand Lower Body Dressing Details (indicate cue type and reason): Min A for coorindation in donning socks over toes. Min A for additional support in sitting Toilet Transfer: Minimal assistance,+2 for safety/equipment,Ambulation,Regular Toilet,Grab bars Toilet Transfer Details (indicate cue type and reason): Min A  for safety Toileting- Clothing Manipulation and Hygiene: Min guard,Sitting/lateral lean Functional mobility during ADLs: Minimal assistance,Moderate assistance,Rolling walker General ADL Comments: Pt presenting with decreased coorination, cognition, nad balance imapcting her safe performance of ADLs      Mobility   Overal bed mobility: Needs Assistance Bed Mobility: Supine to Sit Supine to sit: Supervision General bed mobility comments: pt able to come slowly but safelt to EOB without physical assist     Transfers   Overall transfer level: Needs assistance Equipment used: Rolling walker (2 wheeled) Transfers: Sit to/from Stand Sit to Stand: Min guard General transfer comment: UE support needed as pt comes to standing, close guarding given     Ambulation / Gait / Stairs / Wheelchair Mobility   Ambulation/Gait Ambulation/Gait assistance: Herbalist (Feet): 50 Feet (3x) Assistive device: Rolling walker (2 wheeled) Gait Pattern/deviations: Step-to pattern,Decreased stride length,Narrow base of support General Gait Details: took increased time performing supine, seated and standing exercises before BP increased enough to safely ambulate. Pt unsteady with ambulation as well as mildly dizzy so chair brought behind for safety and frequent rest breaks needed. Gait velocity: decreased Gait velocity interpretation: <1.31 ft/sec, indicative of household ambulator     Posture / Balance Dynamic Sitting Balance Sitting balance - Comments: can maintain static sitting without support Balance Overall balance assessment: Needs assistance Sitting-balance support: No upper extremity supported,Feet supported Sitting balance-Leahy Scale: Fair Sitting balance - Comments: can maintain static sitting without support Standing balance support: Bilateral upper extremity supported,During functional activity,No upper extremity supported Standing balance-Leahy Scale: Poor Standing balance comment: Reliant on UE support and physical assist     Special needs/care consideration Continuous Drip IV  0.9% NaCl with KCl 20 mEq/L infusion, Skin surgical incision: head/posterior, Diabetic management novoLOG: 6 units 3x daily with meals; novoLOG: 0-9 units every 4 hours; Lantus:  22 units daily and  Designated visitor Amedeo Kinsman, mother        Previous Home Environment (from acute therapy documentation) Living Arrangements: Parent,Children  Lives With: Daughter,Family Available Help at Discharge: Available 24 hours/day,Family Type of Home: [place]: One level Home Access: Stairs to enter,Ramped entrance (ramped entrance in garage) Entrance Stairs-Rails: None Entrance Stairs-Number of Steps: 5 in front of house Bathroom Shower/Tub: Multimedia programmer: Standard Bathroom Accessibility: Yes How Accessible: Accessible via walker [place]: No Additional Comments: mother been staying past few weeks. two falls since decline in Nov   Discharge Living Setting Plans for Discharge Living Setting: Patient's home Type of Home at Discharge: House Discharge Home Layout: One level Discharge Home Access: Stairs to enter,Ramped entrance (ramped entracne in garage) Entrance Stairs-Rails: None Entrance Stairs-Number of Steps: 5 steps in front of house Discharge Bathroom Shower/Tub: Walk-in shower Discharge Bathroom Toilet: Standard Discharge Bathroom Accessibility: Yes How Accessible: Accessible via walker Does the patient have any problems obtaining your medications?: No   Social/Family/Support Systems Patient Roles: Parent Anticipated Caregiver: Amedeo Kinsman, mother Anticipated Caregiver's Contact Information: [phone] Caregiver Availability: 24/7 Discharge Plan Discussed with Primary Caregiver: Yes Is Caregiver In Agreement with Plan?: Yes Does Caregiver/Family have Issues with Lodging/Transportation while Pt is in Rehab?: No     Goals Patient/Family Goal for Rehab: supervision: PT/OT/ST Expected length of stay: 7-10 days Pt/Family Agrees to Admission and willing to participate: Yes Program Orientation Provided & Reviewed with Pt/Caregiver Including Roles  & Responsibilities: Yes     Decrease burden of Care through IP rehab admission: NA      Possible need for SNF  placement upon discharge: Not anticipated     Patient Condition: This patient's medical and functional status has changed since the consult dated: 05/31/2020 in which the Rehabilitation Physician determined and documented that the patient's condition is appropriate for intensive rehabilitative care in an inpatient rehabilitation facility. See "History of Present Illness" (above) for medical update. Functional changes are: pt is min assist for mobility and min guard for ADLs. Patient's medical and functional status update has been discussed with the Rehabilitation physician and patient remains appropriate for inpatient rehabilitation. Will admit to inpatient rehab today.   Preadmission Screen Completed By:  Bethel Born, CCC-SLP, 06/03/2020 3:51 PM; Updates by Shann Medal, PT, DPT on 06/06/20 at 10:34 AM ______________________________________________________________________   Discussed status with Dr. Dagoberto Ligas on 06/06/20 at 9:55 AM and received approval for admission today.   Admission Coordinator:  Bethel Born, time 9:55 AM Sudie Grumbling 06/06/20              Cosigned by: Courtney Heys, MD at 06/06/2020 10:38 AM

## 2020-06-07 ENCOUNTER — Inpatient Hospital Stay (HOSPITAL_COMMUNITY): Payer: PRIVATE HEALTH INSURANCE | Admitting: Speech Pathology

## 2020-06-07 ENCOUNTER — Inpatient Hospital Stay (HOSPITAL_COMMUNITY): Payer: PRIVATE HEALTH INSURANCE

## 2020-06-07 ENCOUNTER — Inpatient Hospital Stay (HOSPITAL_COMMUNITY): Payer: PRIVATE HEALTH INSURANCE | Admitting: Physical Therapy

## 2020-06-07 DIAGNOSIS — G441 Vascular headache, not elsewhere classified: Secondary | ICD-10-CM

## 2020-06-07 DIAGNOSIS — E1169 Type 2 diabetes mellitus with other specified complication: Secondary | ICD-10-CM

## 2020-06-07 DIAGNOSIS — E669 Obesity, unspecified: Secondary | ICD-10-CM

## 2020-06-07 LAB — COMPREHENSIVE METABOLIC PANEL
ALT: 40 U/L (ref 0–44)
AST: 17 U/L (ref 15–41)
Albumin: 2.8 g/dL — ABNORMAL LOW (ref 3.5–5.0)
Alkaline Phosphatase: 76 U/L (ref 38–126)
Anion gap: 12 (ref 5–15)
BUN: 18 mg/dL (ref 6–20)
CO2: 25 mmol/L (ref 22–32)
Calcium: 8.7 mg/dL — ABNORMAL LOW (ref 8.9–10.3)
Chloride: 101 mmol/L (ref 98–111)
Creatinine, Ser: 0.97 mg/dL (ref 0.44–1.00)
GFR, Estimated: >60 mL/min (ref >60)
Glucose, Bld: 234 mg/dL — ABNORMAL HIGH (ref 70–99)
Potassium: 4.2 mmol/L (ref 3.5–5.1)
Sodium: 138 mmol/L (ref 135–145)
Total Bilirubin: 0.3 mg/dL (ref 0.3–1.2)
Total Protein: 5.2 g/dL — ABNORMAL LOW (ref 6.5–8.1)

## 2020-06-07 LAB — CBC WITH DIFFERENTIAL/PLATELET
Abs Immature Granulocytes: 0.08 10*3/uL — ABNORMAL HIGH (ref 0.00–0.07)
Basophils Absolute: 0 10*3/uL (ref 0.0–0.1)
Basophils Relative: 0 %
Eosinophils Absolute: 0 10*3/uL (ref 0.0–0.5)
Eosinophils Relative: 0 %
HCT: 32.6 % — ABNORMAL LOW (ref 36.0–46.0)
Hemoglobin: 11.4 g/dL — ABNORMAL LOW (ref 12.0–15.0)
Immature Granulocytes: 2 %
Lymphocytes Relative: 20 %
Lymphs Abs: 1 10*3/uL (ref 0.7–4.0)
MCH: 30.2 pg (ref 26.0–34.0)
MCHC: 35 g/dL (ref 30.0–36.0)
MCV: 86.2 fL (ref 80.0–100.0)
Monocytes Absolute: 0.4 10*3/uL (ref 0.1–1.0)
Monocytes Relative: 8 %
Neutro Abs: 3.4 10*3/uL (ref 1.7–7.7)
Neutrophils Relative %: 70 %
Platelets: 148 10*3/uL — ABNORMAL LOW (ref 150–400)
RBC: 3.78 MIL/uL — ABNORMAL LOW (ref 3.87–5.11)
RDW: 13.5 % (ref 11.5–15.5)
WBC: 4.8 10*3/uL (ref 4.0–10.5)
nRBC: 0 % (ref 0.0–0.2)

## 2020-06-07 LAB — GLUCOSE, CAPILLARY
Glucose-Capillary: 148 mg/dL — ABNORMAL HIGH (ref 70–99)
Glucose-Capillary: 171 mg/dL — ABNORMAL HIGH (ref 70–99)
Glucose-Capillary: 216 mg/dL — ABNORMAL HIGH (ref 70–99)
Glucose-Capillary: 247 mg/dL — ABNORMAL HIGH (ref 70–99)
Glucose-Capillary: 268 mg/dL — ABNORMAL HIGH (ref 70–99)
Glucose-Capillary: 276 mg/dL — ABNORMAL HIGH (ref 70–99)
Glucose-Capillary: 300 mg/dL — ABNORMAL HIGH (ref 70–99)
Glucose-Capillary: 333 mg/dL — ABNORMAL HIGH (ref 70–99)

## 2020-06-07 MED ORDER — INSULIN ASPART 100 UNIT/ML ~~LOC~~ SOLN
5.0000 [IU] | Freq: Once | SUBCUTANEOUS | Status: AC
  Administered 2020-06-07: 5 [IU] via SUBCUTANEOUS

## 2020-06-07 MED ORDER — TOPIRAMATE 25 MG PO TABS
25.0000 mg | ORAL_TABLET | Freq: Every day | ORAL | Status: DC
  Administered 2020-06-07: 25 mg via ORAL
  Filled 2020-06-07: qty 1

## 2020-06-07 MED ORDER — HYDROMORPHONE HCL 2 MG PO TABS
2.0000 mg | ORAL_TABLET | Freq: Two times a day (BID) | ORAL | Status: DC | PRN
  Administered 2020-06-07 – 2020-06-12 (×8): 2 mg via ORAL
  Filled 2020-06-07 (×10): qty 1

## 2020-06-07 MED ORDER — CHLORHEXIDINE GLUCONATE CLOTH 2 % EX PADS
6.0000 | MEDICATED_PAD | Freq: Every day | CUTANEOUS | Status: DC
  Administered 2020-06-07 – 2020-06-14 (×8): 6 via TOPICAL

## 2020-06-07 NOTE — Evaluation (Signed)
Occupational Therapy Assessment and Plan  Patient Details  Name: Shannon [name] MRN: [id number] Date of Birth: 09-Oct-1965  OT Diagnosis: abnormal posture, acute pain, ataxia, cognitive deficits, disturbance of vision and muscle weakness (generalized) Rehab Potential: Rehab Potential (ACUTE ONLY): Good ELOS: 7-10 days   Today's Date: 06/07/2020 OT Individual Time: 1400-1515 OT Individual Time Calculation (min): 75 min     Hospital Problem: Principal Problem:   Cerebellar mass   Past Medical History:  Past Medical History:  Diagnosis Date  . Anemia    Iron De  . Anxiety   . Blood clot in vein   . Bowel obstruction ([place])   . Colon cancer ([place]) 2018   treated with surgery and radiation  . Depression   . Diabetes mellitus without complication ([place])    Type II  . DVT (deep venous thrombosis) ([place]) 2019   behind right knee- while she wasa on chemo  . GERD (gastroesophageal reflux disease)   . History of blood transfusion   . History of chemotherapy   . History of kidney stones 2012   passed  . Hypothyroidism   . Obesity    Past Surgical History:  Past Surgical History:  Procedure Laterality Date  . ABDOMINAL HYSTERECTOMY  05/2019   total laparoscopin with tubes and ovariers removed also.  . APPENDECTOMY  05/2019  . COLON SURGERY  03/2017   Colon resection  . COLONOSCOPY  2019  . SUBOCCIPITAL CRANIECTOMY CERVICAL LAMINECTOMY Left 05/29/2020   Procedure: Suboccipital Craniectomy for Resection of Left Cerebellar Mass;  Surgeon: Kary Kos, MD;  Location: [facility];  Service: Neurosurgery;  Laterality: Left;    Assessment & Plan Clinical Impression:   4 female presenting with a month of headaches nausea vomiting dizziness and ataxia. Work-up showing large left cerebellar mass. 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 PMH including anxiety, anemia, colon cancer (treated with sx and radiation), DM type 2, DVT, and hypothyroidism.  Patient  currently requires min with basic self-care skills secondary to muscle weakness, decreased cardiorespiratoy endurance, ataxia, decreased visual motor skills, decreased problem solving, decreased safety awareness and decreased memory and decreased standing balance, decreased postural control and decreased balance strategies.  Prior to hospitalization, patient could complete BADL/IADL with independent .  Patient will benefit from skilled intervention to decrease level of assist with basic self-care skills and increase independence with basic self-care skills prior to discharge home with care partner.  Anticipate patient will require 24 hour supervision and follow up outpatient.  OT - End of Session Activity Tolerance: Tolerates 30+ min activity with multiple rests Endurance Deficit: Yes OT Assessment Rehab Potential (ACUTE ONLY): Good OT Patient demonstrates impairments in the following area(s): Balance;Cognition;Endurance;Safety;Vision;Motor;Pain OT Basic ADL's Functional Problem(s): Grooming;Bathing;Dressing;Toileting OT Advanced ADL's Functional Problem(s): Laundry;Light Housekeeping OT Transfers Functional Problem(s): Toilet;Tub/Shower OT Plan OT Intensity: Minimum of 1-2 x/day, 45 to 90 minutes OT Frequency: 5 out of 7 days OT Duration/Estimated Length of Stay: 7-10 days OT Treatment/Interventions: Balance/vestibular training;Discharge planning;Pain management;Self Care/advanced ADL retraining;Therapeutic Activities;UE/LE Coordination activities;Visual/perceptual remediation/compensation;Therapeutic Exercise;Skin care/wound managment;Patient/family education;Functional mobility training;Disease mangement/prevention;Cognitive remediation/compensation;Community reintegration;DME/adaptive equipment instruction;Neuromuscular re-education;Psychosocial support;Splinting/orthotics;UE/LE Strength taining/ROM;Wheelchair propulsion/positioning OT Self Feeding Anticipated Outcome(s): MOD I OT Basic  Self-Care Anticipated Outcome(s): MOD I OT Toileting Anticipated Outcome(s): MOD I OT Bathroom Transfers Anticipated Outcome(s): MOD I OT Recommendation Patient destination: Home Follow Up Recommendations: Outpatient OT Equipment Recommended: None recommended by OT Equipment Details: pt has shower chair   OT Evaluation Precautions/Restrictions  Precautions Precautions: Fall Precaution Comments: orthostatic hypotension  Restrictions Weight Bearing Restrictions: No General Chart Reviewed: Yes Family/Caregiver Present: No Vital Signs Therapy Vitals Temp: 98.5 F (36.9 C) Pulse Rate: 93 Resp: 18 BP: 108/76 Patient Position (if appropriate): Sitting Oxygen Therapy SpO2: 96 % O2 Device: Room Air Pain Pain Assessment Pain Scale: 0-10 Pain Score: 6  Pain Location: Head Pain Orientation: Posterior Pain Descriptors / Indicators: Aching Home Living/Prior Functioning Home Living Family/patient expects to be discharged to:: Private residence Living Arrangements: Children Available Help at Discharge: Family,Available 24 hours/day Type of Home: House Home Access: Stairs to enter [employer] of Steps: 5-6 at front; and 2-3 steps in back or garrage Entrance Stairs-Rails: None Home Layout: One level Bathroom Shower/Tub: Walk-in Armed forces operational officer: Standard Bathroom Accessibility: Yes Additional Comments: mother been staying past few weeks. two falls since decline in Nov  Lives With: Daughter Prior Function Level of Independence: Independent with basic ADLs  Able to Take Stairs?: Yes Driving: Yes Vocation: Full time employment Leisure: Hobbies-yes (Comment) Comments: Recently decline since Nov 24 where she couldnt get around without help. 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 Used WC for 2 weeks prior to surgery Vision Baseline Vision/History: Wears glasses Wears Glasses: At all times Patient Visual Report:  Diplopia Vision Assessment?: Yes Eye Alignment: Within Functional Limits Ocular Range of Motion: Within Functional Limits Alignment/Gaze Preference: Within Defined Limits Tracking/Visual Pursuits:  (L eye not tracking as smoothly as R, extra shift especially when tracking horizontally and diagonally) Saccades: Additional eye shifts occurred during testing Convergence: Impaired - to be further tested in functional context Visual Fields: No apparent deficits Diplopia Assessment: Objects split on top of one another;Present in near gaze;Present in primary gaze Perception  Perception: Within Functional Limits Praxis Praxis: Intact Cognition Overall Cognitive Status: Within Functional Limits for tasks assessed Arousal/Alertness: Awake/alert Orientation Level: Person;Place;Situation Person: Oriented Place: Oriented Situation: Oriented Year: 2021 Month: January Day of Week: Correct Memory: Impaired Memory Impairment: Decreased recall of new information Immediate Memory Recall: Sock;Blue;Bed Memory Recall Sock: Without Cue Memory Recall Blue: With Cue Memory Recall Bed: Without Cue Awareness: Appears intact Problem Solving: Impaired Problem Solving Impairment: Functional complex Safety/Judgment: Appears intact Comments: Patient reports deficits in memory impacting job performance Sensation Sensation Light Touch: Appears Intact Proprioception: Appears Intact Coordination Gross Motor Movements are Fluid and Coordinated: No Fine Motor Movements are Fluid and Coordinated: Not tested Coordination and Movement Description: movement deficits noted functionally with gait but not with specific testing Heel Shin Test: [facility] Motor  Motor Motor: Ataxia Motor - Skilled Clinical Observations: Mild ataxia/ dysmetria with gait and functional mobility  Trunk/Postural Assessment  Cervical Assessment Cervical Assessment: Exceptions to [facility] (incision from craniotomy) Thoracic Assessment Thoracic  Assessment: Within Functional Limits Lumbar Assessment Lumbar Assessment: Within Functional Limits Postural Control Postural Control: Deficits on evaluation  Balance Balance Balance Assessed: Yes Standardized Balance Assessment Standardized Balance Assessment: Timed Up and Go Test Timed Up and Go Test TUG: Normal TUG Normal TUG (seconds): 19.26 Static Sitting Balance Static Sitting - Balance Support: Feet supported Static Sitting - Level of Assistance: 6: Modified independent (Device/Increase time) Dynamic Sitting Balance Dynamic Sitting - Balance Support: Feet supported Dynamic Sitting - Level of Assistance: 6: Modified independent (Device/Increase time) Sitting balance - Comments: can maintain static sitting without support Static Standing Balance Static Standing - Balance Support: No upper extremity supported Static Standing - Level of Assistance: 5: Stand by assistance Static Standing - Comment/# of Minutes: with RW x95mn Dynamic Standing Balance Dynamic Standing - Balance Support: No  upper extremity supported;During functional activity Dynamic Standing - Level of Assistance: 4: Min assist Extremity/Trunk Assessment      Care Tool Care Tool Self Care Eating        Oral Care    Oral Care Assist Level: Supervision/Verbal cueing    Bathing   Body parts bathed by patient: Right arm;Left arm;Chest;Abdomen;Front perineal area;Buttocks;Right upper leg;Left upper leg;Right lower leg;Left lower leg;Face (bathed in shower with port covered)     Assist Level: Contact Guard/Touching assist    Upper Body Dressing(including orthotics)   What is the patient wearing?: Pull over shirt;Bra   Assist Level: Contact Guard/Touching assist    Lower Body Dressing (excluding footwear)   What is the patient wearing?: Underwear/pull up;Pants Assist for lower body dressing: Contact Guard/Touching assist    Putting on/Taking off footwear   What is the patient wearing?: Non-skid slipper  socks Assist for footwear: Supervision/Verbal cueing       Care Tool Toileting Toileting activity   Assist for toileting: Contact Guard/Touching assist     Care Tool Bed Mobility Roll left and right activity   Roll left and right assist level: Supervision/Verbal cueing    Sit to lying activity   Sit to lying assist level: Supervision/Verbal cueing    Lying to sitting edge of bed activity   Lying to sitting edge of bed assist level: Supervision/Verbal cueing     Care Tool Transfers Sit to stand transfer   Sit to stand assist level: Minimal Assistance - Patient > 75%    Chair/bed transfer   Chair/bed transfer assist level: Minimal Assistance - Patient > 75%     Toilet transfer   Assist Level: Minimal Assistance - Patient > 75%     Care Tool Cognition Expression of Ideas and Wants Expression of Ideas and Wants: Without difficulty (complex and basic) - expresses complex messages without difficulty and with speech that is clear and easy to understand   Understanding Verbal and Non-Verbal Content Understanding Verbal and Non-Verbal Content: Understands (complex and basic) - clear comprehension without cues or repetitions   Memory/Recall Ability *first 3 days only Memory/Recall Ability *first 3 days only: Current season;Location of own room;Staff names and faces;That he or she is in a hospital/hospital unit    Refer to Care Plan for [place] 1 OT Short Term Goal 1 (Week 1): STG = LTG d/t ELOS  Recommendations for other services: Therapeutic Recreation  Pet therapy, Stress management and Outing/community reintegration   Skilled Therapeutic Intervention 1:1. Pt received in recliner agreeable to OT. Pt completes all mobility with CGA-MIN A level for scissoring gait with no AD. Mild ataxia noted with mobility but [facility] WNL on formal 9HPT RUE 22.7 sec LUE 24.5 seconds. Pt completes standing trailmaking B test in 1 min 13 seconds with pt correcting 1 error  midway through task. Pt completes full ADL as follows below with CGA for standing/ambaultion and S for static standing with grab bar. Pt requires cues to don pants seated. Exited session with pt seated in recliner, exit alarm on and call light in reach   ADL ADL Eating: (P) Independent Grooming: (P) Supervision/safety Where Assessed-Grooming: (P) Standing at sink Upper Body Bathing: (P) Setup Where Assessed-Upper Body Bathing: (P) Shower Lower Body Bathing: (P) Contact guard Where Assessed-Lower Body Bathing: (P) Shower Upper Body Dressing: (P) Setup Where Assessed-Upper Body Dressing: (P) Edge of bed Lower Body Dressing: (P) Minimal assistance Where Assessed-Lower Body Dressing: (P) Edge of bed  Toileting: (P) Contact guard Where Assessed-Toileting: (P) Toilet Toilet Transfer: (P) Contact guard Toilet Transfer Method: (P) Ambulating Walk-In Shower Transfer: (P) Contact guard Social research officer, government Method: (P) Ambulating Mobility  Transfers Sit to Stand: Contact Guard/Touching assist Stand to Sit: Contact Guard/Touching assist   Discharge Criteria: Patient will be discharged from OT if patient refuses treatment 3 consecutive times without medical reason, if treatment goals not met, if there is a change in medical status, if patient makes no progress towards goals or if patient is discharged from hospital.  The above assessment, treatment plan, treatment alternatives and goals were discussed and mutually agreed upon: by patient  Tonny Branch 06/07/2020, 4:51 PM

## 2020-06-07 NOTE — Evaluation (Signed)
Physical Therapy Assessment and Plan  Patient Details  Name: Shannon [name] MRN: [id number] Date of Birth: [birth year]  PT Diagnosis: Abnormality of gait, Ataxia, Ataxic gait and Coordination disorder Rehab Potential: Excellent ELOS: 7-10 days   Today's Date: 06/07/2020 PT Individual Time: 1100-1159 PT Individual Time Calculation (min): 59 min    Hospital Problem: Principal Problem:   Cerebellar mass   Past Medical History:      Past Medical History:  Diagnosis Date  . Anemia    Iron De  . Anxiety   . Blood clot in vein   . Bowel obstruction ([place])   . Colon cancer ([place]) 2018   treated with surgery and radiation  . Depression   . Diabetes mellitus without complication ([place])    Type II  . DVT (deep venous thrombosis) ([place]) 2019   behind right knee- while she wasa on chemo  . GERD (gastroesophageal reflux disease)   . History of blood transfusion   . History of chemotherapy   . History of kidney stones 2012   passed  . Hypothyroidism   . Obesity    Past Surgical History:       Past Surgical History:  Procedure Laterality Date  . ABDOMINAL HYSTERECTOMY  05/2019   total laparoscopin with tubes and ovariers removed also.  . APPENDECTOMY  05/2019  . COLON SURGERY  03/2017   Colon resection  . COLONOSCOPY  2019  . SUBOCCIPITAL CRANIECTOMY CERVICAL LAMINECTOMY Left 05/29/2020   Procedure: Suboccipital Craniectomy for Resection of Left Cerebellar Mass;  Surgeon: Kary Kos, MD;  Location: [facility];  Service: Neurosurgery;  Laterality: Left;    Assessment & Plan Clinical Impression: Patient is a 55 year old right-handed female with history of anxiety/depression maintained on Seroquel 800 mg nightly as well as Valium as needed and Lamictal, OCD maintained on Luvox type 2 diabetes mellitus, DVT, obesity with BMI 31.32, colon cancer 2018 with resection followed by chemotherapy. Per chart review patient lives with her 4 year old daughter  independent prior to admission and working from home. Her mother has been assisting with care. Presented 05/29/2020 with 1 month history of headache nausea vomiting and dizziness with diplopia as well as multiple falls. Work-up with x-ray and imaging showed a large left cerebellar mass. Underwent suboccipital craniectomy for resection of left cerebellar mass 05/29/2020 per Dr. Saintclair Halsted..  Patient transferred to [facility] on 06/06/2020 .   Patient currently requires min with mobility secondary to ataxia and decreased coordination, decreased visual acuity and decreased standing balance and decreased balance strategies.  Prior to hospitalization, patient was independent  with mobility and lived with Daughter in a House home.  Home access is 5-6 at front; and 2-3 steps in back or garrageStairs to enter.  Patient will benefit from skilled PT intervention to maximize safe functional mobility, minimize fall risk and decrease caregiver burden for planned discharge home with intermittent assist.  Anticipate patient will benefit from follow up OP at discharge.  PT - End of Session Activity Tolerance: Tolerates 10 - 20 min activity with multiple rests Endurance Deficit: Yes PT Assessment Rehab Potential (ACUTE/IP ONLY): Excellent PT Barriers to Discharge: Inaccessible home environment;Decreased caregiver support;Lack of/limited family support;Pending chemo/radiation PT Patient demonstrates impairments in the following area(s): Balance;Perception;Safety;Skin Integrity PT Transfers Functional Problem(s): Bed Mobility;Bed to Chair;Car;Furniture;Floor PT Locomotion Functional Problem(s): Ambulation;Wheelchair Mobility;Stairs PT Plan PT Intensity: Minimum of 1-2 x/day ,45 to 90 minutes PT Frequency: 5 out of 7 days PT Duration Estimated Length of Stay: 7-10 days PT Treatment/Interventions: Ambulation/gait training;Balance/vestibular  training;Discharge planning;Functional mobility training;Neuromuscular  re-education;Cognitive remediation/compensation;Community reintegration;DME/adaptive equipment instruction;Disease management/prevention;Pain management;Patient/family education;Stair training;Skin care/wound management;Psychosocial support;Therapeutic Activities;Therapeutic Exercise;UE/LE Strength taining/ROM;UE/LE Coordination activities;Visual/perceptual remediation/compensation;Wheelchair propulsion/positioning PT Transfers Anticipated Outcome(s): Mod I with LRAD PT Locomotion Anticipated Outcome(s): Mod I with LRAD PT Recommendation Recommendations for Other Services: Therapeutic Recreation consult Therapeutic Recreation Interventions: Pet therapy;Kitchen group;Stress management;Outing/community reintergration Follow Up Recommendations: Outpatient PT Patient destination: Home Equipment Recommended: Rolling walker with 5" wheels;To be determined   PT Evaluation Precautions/Restrictions Precautions Precautions: Fall Precaution Comments: orthostatic hypotension Restrictions Weight Bearing Restrictions: NoFall Pain Pain Assessment Pain Scale: 0-10 Pain Score: 6  Pain Location: Head Pain Orientation: Posterior Pain Descriptors / Indicators: Aching Home Living/Prior Functioning Home Living Available Help at Discharge: Family;Available 24 hours/day Type of Home: House Home Access: Stairs to enter [employer] of Steps: 5-6 at front; and 2-3 steps in back or garrage Entrance Stairs-Rails: None Home Layout: One level Bathroom Shower/Tub: Walk-in shower;Tub/shower unit Bathroom Toilet: Standard Bathroom Accessibility: Yes Additional Comments: mother been staying past few weeks. two falls since decline in Nov  Lives With: Daughter Prior Function Level of Independence: Independent with basic ADLs  Able to Take Stairs?: Yes Driving: Yes Vocation: Full time employment Leisure: Hobbies-yes (Comment) Comments: Recently decline since Nov 24 where she couldnt get around  without help. 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 Used WC for 2 weeks prior to surgery Vision/Perception  Vision - Assessment Eye Alignment: Within Functional Limits Ocular Range of Motion: Within Functional Limits Alignment/Gaze Preference: Within Defined Limits Tracking/Visual Pursuits:  (L eye not tracking as smoothly as R, extra shift especially when tracking horizontally and diagonally) Saccades: Additional eye shifts occurred during testing Convergence: Impaired - to be further tested in functional context Diplopia Assessment: Objects split on top of one another;Present in near gaze;Present in primary gaze Perception Perception: Within Functional Limits Praxis Praxis: Intact  Cognition Overall Cognitive Status: Within Functional Limits for tasks assessed Arousal/Alertness: Awake/alert Orientation Level: Oriented X4 Memory: Impaired Memory Impairment: Decreased recall of new information Immediate Memory Recall: Sock;Blue;Bed Memory Recall Sock: Without Cue Memory Recall Blue: With Cue Memory Recall Bed: Without Cue Awareness: Appears intact Problem Solving: Impaired Problem Solving Impairment: Functional complex Safety/Judgment: Appears intact Comments: Patient reports deficits in memory impacting job performance Sensation Sensation Light Touch: Appears Intact Proprioception: Appears Intact Coordination Gross Motor Movements are Fluid and Coordinated: No Fine Motor Movements are Fluid and Coordinated: Not tested Coordination and Movement Description: movement deficits noted functionally with gait but not with specific testing Heel Shin Test: [facility] Motor  Motor Motor: Ataxia Motor - Skilled Clinical Observations: Mild ataxia/ dysmetria with gait and functional mobility   Trunk/Postural Assessment  Cervical Assessment Cervical Assessment: Exceptions to [facility] (incision from craniotomy) Thoracic Assessment Thoracic Assessment: Within Functional  Limits Lumbar Assessment Lumbar Assessment: Within Functional Limits Postural Control Postural Control: Deficits on evaluation  Balance Balance Balance Assessed: Yes Standardized Balance Assessment Standardized Balance Assessment: Timed Up and Go Test Timed Up and Go Test TUG: Normal TUG Normal TUG (seconds): 19.26 Static Sitting Balance Static Sitting - Balance Support: Feet supported Static Sitting - Level of Assistance: 6: Modified independent (Device/Increase time) Dynamic Sitting Balance Dynamic Sitting - Balance Support: Feet supported Dynamic Sitting - Level of Assistance: 6: Modified independent (Device/Increase time) Sitting balance - Comments: can maintain static sitting without support Static Standing Balance Static Standing - Balance Support: No upper extremity supported Static Standing - Level of Assistance: 5: Stand by assistance Static Standing - Comment/# of Minutes: with RW  x62mn Dynamic Standing Balance Dynamic Standing - Balance Support: No upper extremity supported;During functional activity Dynamic Standing - Level of Assistance: 4: Min assist Extremity Assessment  RLE Assessment RLE Assessment: Within Functional Limits General Strength Comments: 5/5 grossly LLE Assessment LLE Assessment: Within Functional Limits General Strength Comments: 5/5 grossly  Care Tool Care Tool Bed Mobility Roll left and right activity Roll left and right assist level: Supervision/Verbal cueing   Sit to lying activity Sit to lying assist level: Supervision/Verbal cueing   Lying to sitting edge of bed activity Lying to sitting edge of bed assist level: Supervision/Verbal cueing    Care Tool Transfers Sit to stand transfer Sit to stand assist level: Minimal Assistance - Patient > 75%   Chair/bed transfer Chair/bed transfer assist level: Minimal Assistance - Patient > 75%    TInvestment banker, corporatetransfer assist level: Contact Guard/Touching assist      Care Tool Locomotion Ambulation Assist level: Minimal Assistance - Patient > 75% Assistive device: No Device Max distance: 50 feet  Walk 10 feet activity Assist level: Minimal Assistance - Patient > 75% Assistive device: No Device   Walk 50 feet with 2 turns activity Assist level: Minimal Assistance - Patient > 75% Assistive device: No Device  Walk 150 feet activity Walk 150 feet activity did not occur: Safety/medical concerns   Walk 10 feet on uneven surfaces activity Assist level: Minimal Assistance - Patient > 75%   Stairs Assist level: Moderate Assistance - Patient - 50 - 74% Stairs assistive device: No device Max number of stairs: 1  Walk up/down 1 step activity Walk up/down 1 step (curb) assist level: Moderate Assistance - Patient - 50 - 74% Walk up/down 1 step or curb assistive device: No device Walk up/down 4 steps activity did not occuR: Safety/medical concerns  Walk up/down 4 steps activity   Walk up/down 12 steps activity Walk up/down 12 steps activity did not occur: Safety/medical concerns   Pick up small objects from floor Pick up small object from the floor (from standing position) activity did not occur: Safety/medical concerns   Wheelchair Will patient use wheelchair at discharge?: No Wheelchair activity did not occur: N/A   Wheel 50 feet with 2 turns activity Wheelchair 50 feet with 2 turns activity did not occur: N/A   Wheel 150 feet activity Wheelchair 150 feet activity did not occur: N/A     Refer to Care Plan for Long Term Goals  SHORT TERM GOAL WEEK 1 PT Short Term Goal 1 (Week 1): STG = LTG due to short LOS  Recommendations for other services: Therapeutic Recreation  Pet therapy, Kitchen group, Stress management and Outing/community reintegration  Skilled Therapeutic Intervention  PT instructed patient in PT Evaluation and initiated treatment intervention; see above for results. PT educated patient in [place] rehab potential, rehab goals, and  discharge recommendations along with recommendation for follow-up rehabilitation services.   PT instructed pt in TUG: 19 sec (average of 3 trials; >13.5 sec indicates increased fall risk)  14.3 sec (>15 sec indicates increased fall risk)  Pt performed gait training up/down ramp and over mulch x 27fand min assist overall. Mild R lateral LOB initially, but able to correct with cues. Cues for increased step width throughout.   Patient returned to room and recliner [facility] in reach and all needs met.     Mobility Transfers Transfers: Sit to Stand;Stand to Sit;Stand Pivot Transfers Sit to Stand: Contact Guard/Touching assist Stand to  Sit: Contact Guard/Touching assist Stand Pivot Transfers: Contact Guard/Touching assist Transfer (Assistive device): Rolling walker Locomotion  Gait Ambulation: Yes Gait Assistance: Contact Guard/Touching assist Gait Distance (Feet): 250 Feet Assistive device: Rolling walker Gait Gait: Yes Gait Pattern: Impaired Gait Pattern: Scissoring;Narrow base of support;Ataxic Stairs / Additional Locomotion Stairs: Yes Stairs Assistance: Contact Guard/Touching assist Stair Management Technique: Two rails Number of Stairs: 12 Height of Stairs: 6 (and 3 inch) Ramp: Minimal Assistance - Patient >75% Curb: Moderate Assistance - Patient 50 - 74% Wheelchair Mobility Wheelchair Mobility: No   Discharge Criteria: Patient will be discharged from PT if patient refuses treatment 3 consecutive times without medical reason, if treatment goals not met, if there is a change in medical status, if patient makes no progress towards goals or if patient is discharged from hospital.  The above assessment, treatment plan, treatment alternatives and goals were discussed and mutually agreed upon: by patient   Lorie Phenix 06/07/2020, 3:54 PM

## 2020-06-07 NOTE — Evaluation (Signed)
Speech Language Pathology Assessment and Plan  Patient Details  Name: Shannon [name] MRN: [id number] Date of Birth: 1966-05-29  SLP Diagnosis: Cognitive Impairments  Rehab Potential: Excellent ELOS: 7-10 days    Today's Date: 06/07/2020 SLP Individual Time: 8676-7209 SLP Individual Time Calculation (min): 62 min   Hospital Problem: Principal Problem:   Cerebellar mass  Past Medical History:  Past Medical History:  Diagnosis Date  . Anemia    Iron De  . Anxiety   . Blood clot in vein   . Bowel obstruction ([place])   . Colon cancer ([place]) 2018   treated with surgery and radiation  . Depression   . Diabetes mellitus without complication ([place])    Type II  . DVT (deep venous thrombosis) ([place]) 2019   behind right knee- while she wasa on chemo  . GERD (gastroesophageal reflux disease)   . History of blood transfusion   . History of chemotherapy   . History of kidney stones 2012   passed  . Hypothyroidism   . Obesity    Past Surgical History:  Past Surgical History:  Procedure Laterality Date  . ABDOMINAL HYSTERECTOMY  05/2019   total laparoscopin with tubes and ovariers removed also.  . APPENDECTOMY  05/2019  . COLON SURGERY  03/2017   Colon resection  . COLONOSCOPY  2019  . SUBOCCIPITAL CRANIECTOMY CERVICAL LAMINECTOMY Left 05/29/2020   Procedure: Suboccipital Craniectomy for Resection of Left Cerebellar Mass;  Surgeon: Kary Kos, MD;  Location: [facility];  Service: Neurosurgery;  Laterality: Left;    Assessment / Plan / Recommendation Clinical Impression Patient is a 55 year old right-handed female with history of anxiety/depression maintained on Seroquel 800 mg nightly as well as Valium as needed and Lamictal, OCD maintained on Luvox type 2 diabetes mellitus, DVT, obesity with BMI 31.32, colon cancer 2018 with resection followed by chemotherapy.  Per chart review patient lives with her 96 year old daughter independent prior to admission and working from home.  Her  mother has been assisting with care.  Presented 05/29/2020 with 1 month history of headache nausea vomiting and dizziness with diplopia as well as multiple falls.  Work-up with x-ray and imaging showed a large left cerebellar mass.  Underwent suboccipital craniectomy for resection of left cerebellar mass 05/29/2020 per Dr. Saintclair Halsted.  Maintained on Decadron protocol.  Tolerating a regular consistency diet.  Physical medicine rehabilitation was consulted to assess candidacy for CIR given impairment in cognition and mobility.  Patient was admitted for a comprehensive rehab program 06/06/20.  SLP administered the Cerebellar Cognitive Affective/Schmahmann Syndrome Scale. Patient "failed" 1/10 subtest that focus on immediate recall of number. Patient scored WFL on all other subtests. Despite formal assessment appeared [facility], patient reports short-term memory deficits at baseline which were impacting her job performance. Throughout informal assessment, mild deficits in working memory noted requiring multiple repetitions of questions with decreased thought organization. Patient also observed taking notes which appeared disorganized. Patient would benefit from skilled SLP intervention to maximize cognitive functioning and overall functional independence prior to discharge.    Skilled Therapeutic Interventions          Administered a cognitive-linguistic evaluation, please see above for details.   SLP Assessment  Patient will need skilled [facility] during CIR admission    Recommendations  Oral Care Recommendations: Oral care BID Recommendations for Other Services: Neuropsych consult Patient destination: Home Follow up Recommendations:  (TBD) Equipment Recommended: None recommended by SLP    SLP Frequency 3 to 5 out of  7 days   SLP Duration  SLP Intensity  SLP Treatment/Interventions 7-10 days  Minumum of 1-2 x/day, 30 to 90 minutes  Cognitive remediation/compensation;Internal/external  aids;Environmental controls;Cueing hierarchy;Functional tasks;Patient/family education;Therapeutic Activities    Pain Pain Assessment Pain Scale: 0-10 Pain Score: 6  Pain Location: Head Pain Orientation: Posterior Pain Descriptors / Indicators: Aching  Prior Functioning Type of Home: House  Lives With: Daughter Available Help at Discharge: Family;Available 24 hours/day Vocation: Full time employment  SLP Evaluation Cognition Overall Cognitive Status: Within Functional Limits for tasks assessed Arousal/Alertness: Awake/alert Orientation Level: Oriented X4 Memory: Impaired Memory Impairment: Decreased recall of new information Immediate Memory Recall: Sock;Blue;Bed Memory Recall Sock: Without Cue Memory Recall Blue: With Cue Memory Recall Bed: Without Cue Awareness: Appears intact Problem Solving: Impaired Problem Solving Impairment: Functional complex Safety/Judgment: Appears intact Comments: Patient reports deficits in memory impacting job performance  Comprehension Auditory Comprehension Overall Auditory Comprehension: Appears within functional limits for tasks assessed Expression Expression Primary Mode of Expression: Verbal Verbal Expression Overall Verbal Expression: Appears within functional limits for tasks assessed Written Expression Dominant Hand: Right Oral Motor Oral Motor/Sensory Function Overall Oral Motor/Sensory Function: Within functional limits Motor Speech Overall Motor Speech: Appears within functional limits for tasks assessed  Care Tool Care Tool Cognition Expression of Ideas and Wants Expression of Ideas and Wants: Without difficulty (complex and basic) - expresses complex messages without difficulty and with speech that is clear and easy to understand   Understanding Verbal and Non-Verbal Content Understanding Verbal and Non-Verbal Content: Understands (complex and basic) - clear comprehension without cues or repetitions   Memory/Recall  Ability *first 3 days only Memory/Recall Ability *first 3 days only: Current season;Location of own room;Staff names and faces;That he or she is in a hospital/hospital unit     Short Term Goals: Week 1: SLP Short Term Goal 1 (Week 1): STGs=LTGs due to ELOS  Refer to Care Plan for Long Term Goals  Recommendations for other services: Neuropsych  Discharge Criteria: Patient will be discharged from SLP if patient refuses treatment 3 consecutive times without medical reason, if treatment goals not met, if there is a change in medical status, if patient makes no progress towards goals or if patient is discharged from hospital.  The above assessment, treatment plan, treatment alternatives and goals were discussed and mutually agreed upon: by patient  Sahmir Weatherbee 06/07/2020, 2:57 PM

## 2020-06-07 NOTE — Progress Notes (Addendum)
[place] PHYSICAL MEDICINE & REHABILITATION PROGRESS NOTE   Subjective/Complaints: Had a fair night. Still having headaches. Oxycodone helps to an extent. Didn't use dilaudid.   ROS: Patient denies fever, rash, sore throat, blurred vision, nausea, vomiting, diarrhea, cough, shortness of breath or chest pain, joint or back pain,  or mood change.   Objective:   No results found. Recent Labs    06/07/20 0525  WBC 4.8  HGB 11.4*  HCT 32.6*  PLT 148*   Recent Labs    06/07/20 0525  NA 138  K 4.2  CL 101  CO2 25  GLUCOSE 234*  BUN 18  CREATININE 0.97  CALCIUM 8.7*    Intake/Output Summary (Last 24 hours) at 06/07/2020 1104 Last data filed at 06/07/2020 0805 Gross per 24 hour  Intake 360 ml  Output --  Net 360 ml        Physical Exam: Vital Signs Blood pressure 121/70, pulse 79, temperature 98.4 F (36.9 C), resp. rate 17, height 5\' 8"  (1.727 m), last menstrual period 02/22/2019, SpO2 97 %.  General: Alert and oriented x 3, No apparent distress HEENT: Head is normocephalic, atraumatic, PERRLA, EOMI, sclera anicteric, oral mucosa pink and moist, dentition intact, ext ear canals clear, crani incision intact with sutures Neck: Supple without JVD or lymphadenopathy Heart: Reg rate and rhythm. No murmurs rubs or gallops Chest: CTA bilaterally without wheezes, rales, or rhonchi; no distress Abdomen: Soft, non-tender, non-distended, bowel sounds positive. Extremities: No clubbing, cyanosis, or edema. Pulses are 2+ Skin: Clean and intact without signs of breakdown Neuro: Pt is cognitively appropriate with normal insight, memory, and awareness. Cranial nerves 2-12 are intact. Sensory exam is normal. Reflexes are 2+ in all 4's. Fine motor coordination is intac generally for finger to nose on right. Mild dysmetria on left. LE coordination intact R>L.  No tremors. Motor function is grossly 5/5.  Musculoskeletal: Full ROM, No pain with AROM or PROM in the neck, trunk, or  extremities. Posture appropriate Psych: Pt's affect is appropriate. Pt is cooperative     Assessment/Plan: 1. Functional deficits which require 3+ hours per day of interdisciplinary therapy in a comprehensive inpatient rehab setting.  Physiatrist is providing close team supervision and 24 hour management of active medical problems listed below.  Physiatrist and rehab team continue to assess barriers to discharge/monitor patient progress toward functional and medical goals  Care Tool:  Bathing              Bathing assist       Upper Body Dressing/Undressing Upper body dressing   What is the patient wearing?: Hospital gown only    Upper body assist Assist Level: Set up assist    Lower Body Dressing/Undressing Lower body dressing      What is the patient wearing?: Underwear/pull up     Lower body assist Assist for lower body dressing: Set up assist     Toileting Toileting    Toileting assist Assist for toileting: Independent with assistive device Assistive Device Comment: walker   Transfers Chair/bed transfer  Transfers assist     Chair/bed transfer assist level: Independent with assistive device Chair/bed transfer assistive device: Programmer, multimedia   Ambulation assist              Walk 10 feet activity   Assist           Walk 50 feet activity   Assist           Walk 150  feet activity   Assist           Walk 10 feet on uneven surface  activity   Assist           Wheelchair     Assist               Wheelchair 50 feet with 2 turns activity    Assist            Wheelchair 150 feet activity     Assist          Blood pressure 121/70, pulse 79, temperature 98.4 F (36.9 C), resp. rate 17, height 5\' 8"  (1.727 m), last menstrual period 02/22/2019, SpO2 97 %.  Medical Problem List and Plan: 1.  Dizziness with blurred vision and decreased functional mobility secondary to  left cerebellar intra-axial lesion, likely metastatic with history of colon cancer 2018.  Status post suboccipital craniectomy resection of cerebellar mass 05/29/2020.  Decadron tapered             -patient may  Shower, but try to cover suboccipital crani site             -ELOS/Goals: 7-10 days mod I 2.  Antithrombotics: -DVT/anticoagulation: SCDs             -antiplatelet therapy: N/A 3. Pain Management: Oxycodone as needed-  -can use oral dilaudid for severe h/a  -will try low dose topamax to see if we can reduce baseline h/a  4. Mood: Luvox 300 mg nightly, Lamictal 150 mg daily             -antipsychotic agents: Seroquel 800 mg nightly  -pt sees Dr. Clovis Pu locally. Is interested in seeing neuropsychology as outpt. Admits to depression at times, definitely has her "moments" 5. Neuropsych: This patient is capable of making decisions on her own behalf. 6. Skin/Wound Care: Routine skin checks.   -op site looks great 7. Fluids/Electrolytes/Nutrition: Routine in and outs   -I personally reviewed the patient's labs today.   8.  Diabetes mellitus.  Hemoglobin A1c 9.6.  Glucophage 500 mg twice daily, NovoLog 6 units 3 times daily, Lantus insulin 20 units daily.  Check blood sugars before meals and at bedtime 9.  Hypothyroidism.  Synthroid 10.  Hyperlipidemia.  Lipitor 11.  GERD.  Protonix 12.  Obesity.  BMI 31.32.  Dietary follow-up 13.  OCD.  Continue Luvox 14. BPPV? Will see if pt can be evaluated for it 15. Orthostatic hypotension? BP ok so far. Will monitor for sx,pattern  -encouraged adequate PO 16. Constipation- added miralax prn   -sorbitol last night with large bm  17. Treatment of L cerebellar mass - appointment for MRI 1/11, Dr Lisbeth Renshaw appt 1/12, and Radiation to start 1/13- pt voiced would like to leave 1/14 at the earliest.    -cbc ok today  LOS: 1 days A FACE TO [place] 06/07/2020, 11:04 AM

## 2020-06-07 NOTE — Plan of Care (Signed)
  Problem: RH Balance Goal: LTG Patient will maintain dynamic standing balance (PT) Description: LTG:  Patient will maintain dynamic standing balance with assistance during mobility activities (PT) Flowsheets (Taken 06/07/2020 1537) LTG: Pt will maintain dynamic standing balance during mobility activities with:: Independent with assistive device    Problem: RH Bed Mobility Goal: LTG Patient will perform bed mobility with assist (PT) Description: LTG: Patient will perform bed mobility with assistance, with/without cues (PT). Flowsheets (Taken 06/07/2020 1537) LTG: Pt will perform bed mobility with assistance level of: Independent   Problem: RH Bed to Chair Transfers Goal: LTG Patient will perform bed/chair transfers w/assist (PT) Description: LTG: Patient will perform bed to chair transfers with assistance (PT). Flowsheets (Taken 06/07/2020 1537) LTG: Pt will perform Bed to Chair Transfers with assistance level: Independent with assistive device    Problem: RH Car Transfers Goal: LTG Patient will perform car transfers with assist (PT) Description: LTG: Patient will perform car transfers with assistance (PT). Flowsheets (Taken 06/07/2020 1537) LTG: Pt will perform car transfers with assist:: Independent with assistive device    Problem: RH Furniture Transfers Goal: LTG Patient will perform furniture transfers w/assist (OT/PT) Description: LTG: Patient will perform furniture transfers  with assistance (OT/PT). Flowsheets (Taken 06/07/2020 1537) LTG: Pt will perform furniture transfers with assist:: Independent with assistive device    Problem: RH Ambulation Goal: LTG Patient will ambulate in controlled environment (PT) Description: LTG: Patient will ambulate in a controlled environment, # of feet with assistance (PT). Flowsheets (Taken 06/07/2020 1537) LTG: Pt will ambulate in controlled environ  assist needed:: Independent with assistive device LTG: Ambulation distance in controlled environment:  150 feet with LRAD Goal: LTG Patient will ambulate in home environment (PT) Description: LTG: Patient will ambulate in home environment, # of feet with assistance (PT). Flowsheets (Taken 06/07/2020 1537) LTG: Pt will ambulate in home environ  assist needed:: Independent with assistive device LTG: Ambulation distance in home environment: 50 feet with LRAD Goal: LTG Patient will ambulate in community environment (PT) Description: LTG: Patient will ambulate in community environment, # of feet with assistance (PT). Flowsheets (Taken 06/07/2020 1537) LTG: Pt will ambulate in community environ  assist needed:: Supervision/Verbal cueing LTG: Ambulation distance in community environment: 300 feet with LRAD   Problem: RH Stairs Goal: LTG Patient will ambulate up and down stairs w/assist (PT) Description: LTG: Patient will ambulate up and down # of stairs with assistance (PT) Flowsheets (Taken 06/07/2020 1537) LTG: Pt will ambulate up/down stairs assist needed:: Supervision/Verbal cueing LTG: Pt will  ambulate up and down number of stairs: 6 steps without HR

## 2020-06-07 NOTE — Progress Notes (Signed)
Patient ID: Shannon [name], female   DOB: 02/24/1966, 55 y.o.   MRN: [id number]  SW spoke with [facility] [phone]) to discuss pt upcoming appointments. Reports pt MRI is scheduled on 1/11 at 3pm here at main campus. States her treatment initial treatment appointment is scheduled for 1/12pm. States she will follow-up with SW if follow-up appointments 1/13; 1/17; 1/19 are needed.   SW scheduled transportation with [facility] [phone]) for 1/12 pick up at 8am for 8:30am appt at [place], and 1/13 pick up at 1pm for 2pm appointment if this appointment is still needed. SW encouraged to follow-up on Monday to ensure appointments are on schedule.  SW updated patient on above.   Loralee Pacas, MSW, [place] Office: [phone] Cell: [phone] Fax: [phone]

## 2020-06-07 NOTE — Progress Notes (Signed)
Inpatient Rehabilitation Care Coordinator Assessment and Plan Patient Details  Name: Shannon [name] MRN: [id number] Date of Birth: 02-Sep-1965  Today's Date: 06/07/2020  Hospital Problems: Principal Problem:   Cerebellar mass  Past Medical History:  Past Medical History:  Diagnosis Date  . Anemia    Iron De  . Anxiety   . Blood clot in vein   . Bowel obstruction ([place])   . Colon cancer ([place]) 2018   treated with surgery and radiation  . Depression   . Diabetes mellitus without complication ([place])    Type II  . DVT (deep venous thrombosis) ([place]) 2019   behind right knee- while she wasa on chemo  . GERD (gastroesophageal reflux disease)   . History of blood transfusion   . History of chemotherapy   . History of kidney stones 2012   passed  . Hypothyroidism   . Obesity    Past Surgical History:  Past Surgical History:  Procedure Laterality Date  . ABDOMINAL HYSTERECTOMY  05/2019   total laparoscopin with tubes and ovariers removed also.  . APPENDECTOMY  05/2019  . COLON SURGERY  03/2017   Colon resection  . COLONOSCOPY  2019  . SUBOCCIPITAL CRANIECTOMY CERVICAL LAMINECTOMY Left 05/29/2020   Procedure: Suboccipital Craniectomy for Resection of Left Cerebellar Mass;  Surgeon: Kary Kos, MD;  Location: [place];  Service: Neurosurgery;  Laterality: Left;   Social History:  reports that she has never smoked. She has never used smokeless tobacco. She reports current alcohol use. She reports that she does not use drugs.  Family / Support Systems Marital Status: Widow/Widower How Long?: 12 years (2009) Patient Roles: Parent Spouse/Significant Other: Widowed Children: Adult daughter  (39 years old) Other Supports: motherMarcie Bal [phone]) Anticipated Caregiver: mother Ability/Limitations of Caregiver: none reported Caregiver Availability: 24/7 Family Dynamics: Pt lives alone with adult dtr (53 y.o.)  Social History Preferred language: English Religion:  None Cultural Background: Pt works as a Glass blower/designer with ID/DD at [employer]: college grad Read: Yes Write: Yes Employment Status: Employed Name of Employer: [employer] Return to Work Plans: Pt intends to return to work when able. Pt has already begun FMLA process .Intends to have forms droped off at Dr. Windy Carina office for completion. Legal History/Current Legal Issues: Denies Guardian/Conservator: N/A   Abuse/Neglect Abuse/Neglect Assessment Can Be Completed: Yes Physical Abuse: Denies Verbal Abuse: Denies Sexual Abuse: Denies Exploitation of patient/patient's resources: Denies Self-Neglect: Denies  Emotional Status Pt's affect, behavior and adjustment status: Pt in good spirits at time of visit Recent Psychosocial Issues: Admits to hx of depression and anxiety. Sees Dr. Charlott Holler at [facility] for medication management. Pt will begin exploring a counselor/therapist as of now, and SW to schedule appointment with Dr. Sima Matas. SW informed on his schedule in which he is booked 6 months out. Psychiatric History: See above Substance Abuse History: admits to occassional EtOH.  Patient / Family Perceptions, Expectations & Goals Pt/Family understanding of illness & functional limitations: Pt has good understanding of care needs Premorbid pt/family roles/activities: Independent; pt has not been to work in in one month due to health reasons. Pt mother has been staying with her during this time. Anticipated changes in roles/activities/participation: No changes  Recruitment consultant: None Premorbid Home Care/DME Agencies: None Transportation available at discharge: mother Resource referrals recommended: Neuropsychology  Discharge Planning Living Arrangements: Children Support Systems: Children,Parent Type of Residence: Private residence Insurance Resources: Multimedia programmer (specify) Environmental education officer) Financial Resources: Employment Financial Screen  Referred: No Living  Expenses: Medical laboratory scientific officer Management: Patient Does the patient have any problems obtaining your medications?: No Care Coordinator Anticipated Follow Up Needs: HH/OP  Clinical Impression SW met with pt in room to introduce self, explain role, and discuss discharge process. Pt is not a English as a second language teacher. DME: RW with wheels, RW without wheels, 2 showerseat- one with a back and arms and the other without arms; 2 ramped locations into home. Pt requested to speak with diabetic counselor. SW update pt assigned RN on request.   Rana Snare 06/07/2020, 4:34 PM

## 2020-06-08 LAB — GLUCOSE, CAPILLARY
Glucose-Capillary: 152 mg/dL — ABNORMAL HIGH (ref 70–99)
Glucose-Capillary: 209 mg/dL — ABNORMAL HIGH (ref 70–99)
Glucose-Capillary: 245 mg/dL — ABNORMAL HIGH (ref 70–99)
Glucose-Capillary: 246 mg/dL — ABNORMAL HIGH (ref 70–99)
Glucose-Capillary: 265 mg/dL — ABNORMAL HIGH (ref 70–99)

## 2020-06-08 MED ORDER — TOPIRAMATE 25 MG PO TABS
25.0000 mg | ORAL_TABLET | Freq: Two times a day (BID) | ORAL | Status: DC
  Administered 2020-06-08 – 2020-06-10 (×5): 25 mg via ORAL
  Filled 2020-06-08 (×5): qty 1

## 2020-06-08 NOTE — IPOC Note (Signed)
Overall Plan of Care [facility]) Patient Details Name: Nhyla Nappi MRN: [id number] DOB: 03-22-1966  Admitting Diagnosis: Cerebellar mass  Hospital Problems: Principal Problem:   Cerebellar mass     Functional Problem List: Nursing Endurance,Safety,Medication Management,Skin Integrity,Bowel,Pain  PT Balance,Perception,Safety,Skin Integrity  OT Balance,Cognition,Endurance,Safety,Vision,Motor,Pain  SLP Cognition  TR         Basic ADL's: OT Grooming,Bathing,Dressing,Toileting     Advanced  ADL's: OT Laundry,Light Housekeeping     Transfers: PT Bed Mobility,Bed to [employer]  OT Toilet,Tub/Shower     Locomotion: PT Ambulation,Wheelchair Mobility,Stairs     Additional Impairments: OT    SLP Social Cognition   Problem Solving,Memory  TR      Anticipated Outcomes Item Anticipated Outcome  Self Feeding MOD I  Swallowing      Basic self-care  MOD I  Toileting  MOD I   Bathroom Transfers MOD I  Bowel/Bladder  manage bowel with mod I assist  Transfers  Mod I with LRAD  Locomotion  Mod I with LRAD  Communication     Cognition  Supervision  Pain  Pain at or below level 4  Safety/Judgment  Maintain safety with cues/reminders   Therapy Plan: PT Intensity: Minimum of 1-2 x/day ,45 to 90 minutes PT Frequency: 5 out of 7 days PT Duration Estimated Length of Stay: 7-10 days OT Intensity: Minimum of 1-2 x/day, 45 to 90 minutes OT Frequency: 5 out of 7 days OT Duration/Estimated Length of Stay: 7-10 days SLP Intensity: Minumum of 1-2 x/day, 30 to 90 minutes SLP Frequency: 3 to 5 out of 7 days SLP Duration/Estimated Length of Stay: 7-10 days   Due to the current state of emergency, patients may not be receiving their 3-hours of Medicare-mandated therapy.   Team Interventions: Nursing Interventions Patient/Family Education,Disease Management/Prevention,Medication Management,Discharge Planning,Skin Care/Wound Management,Pain  Management,Bowel Management  PT interventions Ambulation/gait training,Balance/vestibular training,Discharge planning,Functional mobility training,Neuromuscular re-education,Cognitive remediation/compensation,Community reintegration,DME/adaptive equipment instruction,Disease management/prevention,Pain management,Patient/family education,Stair training,Skin care/wound management,Psychosocial support,Therapeutic Activities,Therapeutic Exercise,UE/LE Strength taining/ROM,UE/LE Coordination activities,Visual/perceptual remediation/compensation,Wheelchair propulsion/positioning  OT Interventions Balance/vestibular training,Discharge planning,Pain management,Self Care/advanced ADL retraining,Therapeutic Activities,UE/LE Coordination activities,Visual/perceptual remediation/compensation,Therapeutic Exercise,Skin care/wound managment,Patient/family education,Functional mobility training,Disease mangement/prevention,Cognitive remediation/compensation,Community Corporate treasurer re-education,Psychosocial support,Splinting/orthotics,UE/LE Strength taining/ROM,Wheelchair propulsion/positioning  SLP Interventions Cognitive remediation/compensation,Internal/external aids,Environmental controls,Cueing hierarchy,Functional tasks,Patient/family education,Therapeutic Activities  TR Interventions    SW/CM Interventions Discharge Planning,Psychosocial Support,Patient/Family Education   Barriers to Discharge MD  Medical stability  Nursing      PT Inaccessible home environment,Decreased caregiver support,Lack of/limited family support,Pending chemo/radiation    OT      SLP      SW       Team Discharge Planning: Destination: PT-Home ,OT- Home , SLP-Home Projected Follow-up: PT-Outpatient PT, OT-  Outpatient OT, SLP- (TBD) Projected Equipment Needs: PT-Rolling walker with 5" wheels,To be determined, OT- None recommended by OT, SLP-None recommended by SLP Equipment  Details: PT- , OT-pt has shower chair Patient/family involved in discharge planning: PT- Patient,  OT-Patient, SLP-Patient  MD ELOS: 7-10 days Medical Rehab Prognosis:  Excellent Assessment: The patient has been admitted for CIR therapies with the diagnosis of cerebellar mass s/p resection. The team will be addressing functional mobility, strength, stamina, balance, safety, adaptive techniques and equipment, self-care, bowel and bladder mgt, patient and caregiver education, vestibular rx, pain control community reentry. Goals have been set at mod I for mobility and self-care.   Due to the current state of emergency, patients may not be receiving their 3 hours per day of Medicare-mandated therapy.    Meredith Staggers, MD, [place]  See Team Conference Notes for weekly updates to the plan of care

## 2020-06-08 NOTE — Progress Notes (Signed)
[place] PHYSICAL MEDICINE & REHABILITATION PROGRESS NOTE   Subjective/Complaints: Did much better last night. Headaches were controlled with small dose of dilaudid and addition of topamax. Therapy went well also  ROS: Patient denies fever, rash, sore throat, blurred vision, nausea, vomiting, diarrhea, cough, shortness of breath or chest pain, joint or back pain,   or mood change.    Objective:   No results found. Recent Labs    06/07/20 0525  WBC 4.8  HGB 11.4*  HCT 32.6*  PLT 148*   Recent Labs    06/07/20 0525  NA 138  K 4.2  CL 101  CO2 25  GLUCOSE 234*  BUN 18  CREATININE 0.97  CALCIUM 8.7*    Intake/Output Summary (Last 24 hours) at 06/08/2020 1124 Last data filed at 06/07/2020 2300 Gross per 24 hour  Intake 240 ml  Output --  Net 240 ml        Physical Exam: Vital Signs Blood pressure (!) 113/57, pulse 77, temperature 99.3 F (37.4 C), temperature source Oral, resp. rate 18, height 5\' 8"  (1.727 m), last menstrual period 02/22/2019, SpO2 96 %.  Constitutional: No distress . Vital signs reviewed. HEENT: EOMI, oral membranes moist Neck: supple Cardiovascular: RRR without murmur. No JVD    Respiratory/Chest: CTA Bilaterally without wheezes or rales. Normal effort    GI/Abdomen: BS +, non-tender, non-distended Ext: no clubbing, cyanosis, or edema Psych: pleasant and cooperative Skin: cervical incision CDI with suture Neuro: Pt is cognitively appropriate with normal insight, memory, and awareness. Cranial nerves 2-12 are intact. Sensory exam is normal. Reflexes are 2+ in all 4's. Fine motor coordination is intac generally for finger to nose on right. Mild dysmetria on left. LE coordination intact R>L.  No tremors. Motor function is grossly 5/5.  Musculoskeletal: Full ROM, No pain with AROM or PROM in the neck, trunk, or extremities. Posture appropriate       Assessment/Plan: 1. Functional deficits which require 3+ hours per day of interdisciplinary  therapy in a comprehensive inpatient rehab setting.  Physiatrist is providing close team supervision and 24 hour management of active medical problems listed below.  Physiatrist and rehab team continue to assess barriers to discharge/monitor patient progress toward functional and medical goals  Care Tool:  Bathing    Body parts bathed by patient: Right arm,Left arm,Chest,Abdomen,Front perineal area,Buttocks,Right upper leg,Left upper leg,Right lower leg,Left lower leg,Face (bathed in shower with port covered)         Bathing assist Assist Level: Contact Guard/Touching assist     Upper Body Dressing/Undressing Upper body dressing   What is the patient wearing?: Pull over shirt,Bra    Upper body assist Assist Level: Contact Guard/Touching assist    Lower Body Dressing/Undressing Lower body dressing      What is the patient wearing?: Underwear/pull up,Pants     Lower body assist Assist for lower body dressing: Contact Guard/Touching assist     Toileting Toileting    Toileting assist Assist for toileting: Contact Guard/Touching assist Assistive Device Comment: walker   Transfers Chair/bed transfer  Transfers assist     Chair/bed transfer assist level: Independent Chair/bed transfer assistive device: Museum/gallery exhibitions officer assist      Assist level: Minimal Assistance - Patient > 75% Assistive device: No Device Max distance: 50 feet   Walk 10 feet activity   Assist     Assist level: Minimal Assistance - Patient > 75% Assistive device: No Device   Walk 50 feet  activity   Assist    Assist level: Minimal Assistance - Patient > 75% Assistive device: No Device    Walk 150 feet activity   Assist Walk 150 feet activity did not occur: Safety/medical concerns         Walk 10 feet on uneven surface  activity   Assist     Assist level: Minimal Assistance - Patient > 75%     Wheelchair     Assist Will patient  use wheelchair at discharge?: No   Wheelchair activity did not occur: N/A         Wheelchair 50 feet with 2 turns activity    Assist    Wheelchair 50 feet with 2 turns activity did not occur: N/A       Wheelchair 150 feet activity     Assist  Wheelchair 150 feet activity did not occur: N/A       Blood pressure (!) 113/57, pulse 77, temperature 99.3 F (37.4 C), temperature source Oral, resp. rate 18, height 5\' 8"  (1.727 m), last menstrual period 02/22/2019, SpO2 96 %.  Medical Problem List and Plan: 1.  Dizziness with blurred vision and decreased functional mobility secondary to left cerebellar intra-axial lesion, likely metastatic with history of colon cancer 2018.  Status post suboccipital craniectomy resection of cerebellar mass 05/29/2020.  Decadron tapered             -patient may  Shower, but try to cover suboccipital crani site             -ELOS/Goals: 7-10 days mod I 2.  Antithrombotics: -DVT/anticoagulation: SCDs             -antiplatelet therapy: N/A 3. Pain Management: Oxycodone as needed-  -can use oral dilaudid for severe h/a  -did well with topamax, increase to bid with goal of removing dilaudid---pt agrees to plan  4. Mood: Luvox 300 mg nightly, Lamictal 150 mg daily             -antipsychotic agents: Seroquel 800 mg nightly  -pt sees Dr. Clovis Pu locally. Is interested in seeing neuropsychology as outpt. Admits to depression at times, definitely has her "moments" 5. Neuropsych: This patient is capable of making decisions on her own behalf. 6. Skin/Wound Care: Routine skin checks.   -op site looks great 7. Fluids/Electrolytes/Nutrition: Routine in and outs   -intake adequate at present.   8.  Diabetes mellitus.  Hemoglobin A1c 9.6.  Glucophage 500 mg twice daily, NovoLog 6 units 3 times daily, Lantus insulin 20 units daily.  Check blood sugars before meals and at bedtime 9.  Hypothyroidism.  Synthroid 10.  Hyperlipidemia.  Lipitor 11.  GERD.   Protonix 12.  Obesity.  BMI 31.32.  Dietary follow-up 13.  OCD.  Continue Luvox 14. BPPV? Will see if pt can be evaluated for it 15. Orthostatic hypotension? BP ok so far.    -encouraged adequate PO 16. Constipation- added miralax prn   -sorbitol 1/6 with large bm  17. Treatment of L cerebellar mass - appointment for MRI 1/11, Dr Lisbeth Renshaw appt 1/12, and Radiation to start 1/13- pt voiced would like to leave 1/14 at the earliest.    -cbc ok 1/7  LOS: 2 days A FACE TO FACE EVALUATION WAS PERFORMED  Meredith Staggers 06/08/2020, 11:24 AM

## 2020-06-09 ENCOUNTER — Inpatient Hospital Stay (HOSPITAL_COMMUNITY): Payer: PRIVATE HEALTH INSURANCE

## 2020-06-09 ENCOUNTER — Inpatient Hospital Stay (HOSPITAL_COMMUNITY): Payer: PRIVATE HEALTH INSURANCE | Admitting: Occupational Therapy

## 2020-06-09 ENCOUNTER — Inpatient Hospital Stay (HOSPITAL_COMMUNITY): Payer: PRIVATE HEALTH INSURANCE | Admitting: Speech Pathology

## 2020-06-09 LAB — GLUCOSE, CAPILLARY
Glucose-Capillary: 165 mg/dL — ABNORMAL HIGH (ref 70–99)
Glucose-Capillary: 220 mg/dL — ABNORMAL HIGH (ref 70–99)
Glucose-Capillary: 225 mg/dL — ABNORMAL HIGH (ref 70–99)
Glucose-Capillary: 311 mg/dL — ABNORMAL HIGH (ref 70–99)
Glucose-Capillary: 325 mg/dL — ABNORMAL HIGH (ref 70–99)
Glucose-Capillary: 384 mg/dL — ABNORMAL HIGH (ref 70–99)

## 2020-06-09 MED ORDER — INSULIN GLARGINE 100 UNIT/ML ~~LOC~~ SOLN
25.0000 [IU] | Freq: Every day | SUBCUTANEOUS | Status: DC
  Administered 2020-06-10: 25 [IU] via SUBCUTANEOUS
  Filled 2020-06-09: qty 0.25

## 2020-06-09 MED ORDER — INSULIN GLARGINE 100 UNIT/ML ~~LOC~~ SOLN
5.0000 [IU] | Freq: Once | SUBCUTANEOUS | Status: AC
  Administered 2020-06-09: 5 [IU] via SUBCUTANEOUS
  Filled 2020-06-09: qty 0.05

## 2020-06-09 NOTE — Progress Notes (Signed)
[place] PHYSICAL MEDICINE & REHABILITATION PROGRESS NOTE   Subjective/Complaints: Headaches improving with dilaudid and topamax. Able to sleep. Therapies went well yesterday. Asked about timing of XRT, rehab discharge. Mood holding up  ROS: Patient denies fever, rash, sore throat, blurred vision, nausea, vomiting, diarrhea, cough, shortness of breath or chest pain, joint or back pain,   or mood change. .    Objective:   No results found. Recent Labs    06/07/20 0525  WBC 4.8  HGB 11.4*  HCT 32.6*  PLT 148*   Recent Labs    06/07/20 0525  NA 138  K 4.2  CL 101  CO2 25  GLUCOSE 234*  BUN 18  CREATININE 0.97  CALCIUM 8.7*    Intake/Output Summary (Last 24 hours) at 06/09/2020 1035 Last data filed at 06/09/2020 0754 Gross per 24 hour  Intake 718 ml  Output --  Net 718 ml        Physical Exam: Vital Signs Blood pressure 114/76, pulse 81, temperature 98.3 F (36.8 C), resp. rate 18, height 5\' 8"  (1.727 m), last menstrual period 02/22/2019, SpO2 96 %.  Constitutional: No distress . Vital signs reviewed. HEENT: EOMI, oral membranes moist Neck: supple Cardiovascular: RRR without murmur. No JVD    Respiratory/Chest: CTA Bilaterally without wheezes or rales. Normal effort    GI/Abdomen: BS +, non-tender, non-distended Ext: no clubbing, cyanosis, or edema Psych: pleasant and cooperative Skin: cervical incision CDI with sutures Neuro: Pt is cognitively appropriate with normal insight, memory, and awareness. Cranial nerves 2-12 are intact. Sensory exam is normal. Reflexes are 2+ in all 4's. Fine motor coordination is intac generally for finger to nose on right. Mild dysmetria, past pointing LUE.  No tremors. Motor function is grossly 5/5.  Musculoskeletal: Full ROM, No pain with AROM or PROM in the neck, trunk, or extremities. Posture appropriate       Assessment/Plan: 1. Functional deficits which require 3+ hours per day of interdisciplinary therapy in a  comprehensive inpatient rehab setting.  Physiatrist is providing close team supervision and 24 hour management of active medical problems listed below.  Physiatrist and rehab team continue to assess barriers to discharge/monitor patient progress toward functional and medical goals  Care Tool:  Bathing    Body parts bathed by patient: Right arm,Left arm,Chest,Abdomen,Front perineal area,Buttocks,Right upper leg,Left upper leg,Right lower leg,Left lower leg,Face (bathed in shower with port covered)         Bathing assist Assist Level: Contact Guard/Touching assist     Upper Body Dressing/Undressing Upper body dressing   What is the patient wearing?: Pull over shirt,Bra    Upper body assist Assist Level: Contact Guard/Touching assist    Lower Body Dressing/Undressing Lower body dressing      What is the patient wearing?: Underwear/pull up,Pants     Lower body assist Assist for lower body dressing: Contact Guard/Touching assist     Toileting Toileting    Toileting assist Assist for toileting: Supervision/Verbal cueing Assistive Device Comment: walker   Transfers Chair/bed transfer  Transfers assist     Chair/bed transfer assist level: Supervision/Verbal cueing Chair/bed transfer assistive device: Programmer, multimedia   Ambulation assist      Assist level: Supervision/Verbal cueing Assistive device: No Device Max distance: 50 feet   Walk 10 feet activity   Assist     Assist level: Minimal Assistance - Patient > 75% Assistive device: No Device   Walk 50 feet activity   Assist    Assist  level: Minimal Assistance - Patient > 75% Assistive device: No Device    Walk 150 feet activity   Assist Walk 150 feet activity did not occur: Safety/medical concerns         Walk 10 feet on uneven surface  activity   Assist     Assist level: Minimal Assistance - Patient > 75%     Wheelchair     Assist Will patient use wheelchair  at discharge?: No   Wheelchair activity did not occur: N/A         Wheelchair 50 feet with 2 turns activity    Assist    Wheelchair 50 feet with 2 turns activity did not occur: N/A       Wheelchair 150 feet activity     Assist  Wheelchair 150 feet activity did not occur: N/A       Blood pressure 114/76, pulse 81, temperature 98.3 F (36.8 C), resp. rate 18, height 5\' 8"  (1.727 m), last menstrual period 02/22/2019, SpO2 96 %.  Medical Problem List and Plan: 1.  Dizziness with blurred vision and decreased functional mobility secondary to left cerebellar intra-axial lesion, likely metastatic with history of colon cancer 2018.  Status post suboccipital craniectomy resection of cerebellar mass 05/29/2020.  Decadron tapered             -patient may  Shower, but try to cover suboccipital crani site             -ELOS/Goals: 7-10 days mod I  -ultimately if we need to transport to XRT for a session or two we can from inpatient rehab 2.  Antithrombotics: -DVT/anticoagulation: SCDs             -antiplatelet therapy: N/A 3. Pain Management: Oxycodone as needed-  -can use oral dilaudid for severe h/a  -did well with topamax, increased to bid 1/8 with goal of removing dilaudid   -headaches controlled at present 4. Mood: Luvox 300 mg nightly, Lamictal 150 mg daily             -antipsychotic agents: Seroquel 800 mg nightly  -pt sees Dr. Clovis Pu locally. Is interested in seeing neuropsychology as outpt. Admits to depression at times, definitely has her "moments" 5. Neuropsych: This patient is capable of making decisions on her own behalf. 6. Skin/Wound Care: Routine skin checks.   -op site looks great--dc sutures today 7. Fluids/Electrolytes/Nutrition: Routine in and outs   -intake adequate at present.   8.  Diabetes mellitus.  Hemoglobin A1c 9.6.  Glucophage 500 mg twice daily, NovoLog 6 units 3 times daily, Lantus insulin 20 units daily.  Check blood sugars before meals and at  bedtime  -cbg's have been poorly controlled in 200'-300's d/t decadron  -increase lantus to 25u daily, continue same novolog  -check with NS this week re: decadron taper 9.  Hypothyroidism.  Synthroid 10.  Hyperlipidemia.  Lipitor 11.  GERD.  Protonix 12.  Obesity.  BMI 31.32.  Dietary follow-up 13.  OCD.  Continue Luvox 14. BPPV? Will see if pt can be evaluated for it 15. Orthostatic hypotension? BP ok so far.    -encouraged adequate PO 16. Constipation- added miralax prn   -sorbitol 1/6 with large bm  17. Treatment of L cerebellar mass - appointment for MRI 1/11, Dr Lisbeth Renshaw appt 1/12, and Radiation to start 1/13-    -cbc ok 1/7  -see above LOS: 3 days A FACE TO [place] 06/09/2020, 10:35 AM

## 2020-06-09 NOTE — Progress Notes (Signed)
Occupational Therapy Session Note  Patient Details  Name: Shannon [name] MRN: [id number] Date of Birth: [birth year]  Today's Date: 06/09/2020 OT Individual Time: 1100-1154 OT Individual Time Calculation (min): 54 min    Short Term Goals: Week 1:  OT Short Term Goal 1 (Week 1): STG = LTG d/t ELOS  Skilled Therapeutic Interventions/Progress Updates:    Pt received supine with 6/10 HA present, pt reporting she is premedicated and agreeable to OT session. Pt initially requesting to take shower but after inspection chest port dressing peeling. RN notified who confirmed shower should be skipped today until IV team can complete dressing change tomorrow. Session limited by BP- all values listed below with their position. Pt completed functional mobility around the room to gather items needed for shower with CGA. Pt initially attempting to furniture walk- she was cued to reduce reliance on external aids and she was able to continue with mobility with only CGA. Pt completed UB bathing seated at the sink with set up assist. Cueing for completing LB dressing seated 2/2 OH and balance deficits for safety. BP assessed following ADLs as listed below. Pt removed knee high teds and replaced with thigh high teds. After several minutes, another BP reading obtained- listed below. Pt was returned to supine 2/2 hypotension. Encouraged PO intake and got pt more water to have in room. Pt was left with all needs met, bed alarm set.   BP: seated- 105/73 Standing- 97/54 Standing after ADLS with knee high teds- 84/56 Standing after thigh high teds donned-  81/57  Therapy Documentation Precautions:  Precautions Precautions: Fall Precaution Comments: orthostatic hypotension Restrictions Weight Bearing Restrictions: No   Therapy/Group: Individual Therapy  Curtis Sites 06/09/2020, 11:33 AM

## 2020-06-09 NOTE — Progress Notes (Signed)
Occupational Therapy Session Note  Patient Details  Name: Shannon [name] MRN: [id number] Date of Birth: [birth year]  Today's Date: 06/09/2020 OT Individual Time: 2706-2376 OT Individual Time Calculation (min): 47 min    Short Term Goals: Week 1:  OT Short Term Goal 1 (Week 1): STG = LTG d/t ELOS  Skilled Therapeutic Interventions/Progress Updates:    Patient in bed, alert, notes occ dizzyness/lightheadedness with activity this session.  She denies pain.  BP in sitting 113/78, in stance 91/71 (thigh high teds on)  Limited standing activities this session.  Ambulation without AD CGA to/from bed and toilet - completed toileting with CS.  Reviewed safety with walking without AD.   To therapy gym via w/c - completed visual motor activities  Utilized BITS for saccades (alternating #/lettters, persist, seated) = rxn 5.37 sec, overall time 4:39, 89%    BITS for tracing - improved percentage with repetition - she notes difficulty with slow pursuits (increased nausea etc)    Accomodation - difficulty with distant objects, VOR - slow/impaired, brock string (depth) impaired with intermittent suppression (more difficult > 30 inches) She returned to bed at close of session - reviewed visual motor activities that she can practice on her own.  Bed alarm set and call bell in hand.    Therapy Documentation Precautions:  Precautions Precautions: Fall Precaution Comments: orthostatic hypotension Restrictions Weight Bearing Restrictions: No   Therapy/Group: Individual Therapy  Carlos Levering 06/09/2020, 7:46 AM

## 2020-06-09 NOTE — Progress Notes (Signed)
Speech Language Pathology Daily Session Note  Patient Details  Name: Shannon [name] MRN: [id number] Date of Birth: [birth year]  Today's Date: 06/09/2020 SLP Individual Time: 0630-1601 SLP Individual Time Calculation (min): 55 min  Short Term Goals: Week 1: SLP Short Term Goal 1 (Week 1): STGs=LTGs due to ELOS  Skilled Therapeutic Interventions: Pt was seen for skilled ST targeting cognitive goals. Pt reported she feels as though short term memory deficits have been impacting her job performance and therefore, SLP educated pt regarding strategies to keep notes in central location and a main reference sheet with daily tasks as opposed to having many sheets of notes in separate places. Pt also expressed experiencing difficulty maintaining a routine - SLP suggested use of recurring phone alarms as aids to assist with structured prompts for medication and meal times.  Pt also completed a semi-complex monthly calendar/shceduling task Mod I for problem solving and with 100% accuracy. She required overall Min A verbal cues for recall of current medications. Pt recorded her own medications in list format as compensatory aid for future recall. Recommend targeting organization of pill box at next available session; could not complete this time due to time constraints. Pt left sitting upright in bed with alarm set and needs within reach. Continue per current plan of care.       Pain Pain Assessment Pain Scale: Faces Pain Score: 0-No pain Faces Pain Scale: Hurts little more Pain Type: Surgical pain Pain Location: Head Pain Orientation: Posterior Pain Descriptors / Indicators: Headache Pain Onset: On-going Pain Intervention(s): RN made aware Multiple Pain Sites: No  Therapy/Group: Individual Therapy  Arbutus Leas 06/09/2020, 7:29 AM

## 2020-06-10 ENCOUNTER — Inpatient Hospital Stay: Payer: PRIVATE HEALTH INSURANCE | Attending: Oncology

## 2020-06-10 ENCOUNTER — Inpatient Hospital Stay (HOSPITAL_COMMUNITY): Payer: PRIVATE HEALTH INSURANCE | Admitting: Occupational Therapy

## 2020-06-10 ENCOUNTER — Inpatient Hospital Stay (HOSPITAL_COMMUNITY): Payer: PRIVATE HEALTH INSURANCE | Admitting: Physical Therapy

## 2020-06-10 ENCOUNTER — Inpatient Hospital Stay (HOSPITAL_COMMUNITY): Payer: PRIVATE HEALTH INSURANCE | Admitting: Speech Pathology

## 2020-06-10 LAB — GLUCOSE, CAPILLARY
Glucose-Capillary: 361 mg/dL — ABNORMAL HIGH (ref 70–99)
Glucose-Capillary: 292 mg/dL — ABNORMAL HIGH (ref 70–99)
Glucose-Capillary: 198 mg/dL — ABNORMAL HIGH (ref 70–99)
Glucose-Capillary: 312 mg/dL — ABNORMAL HIGH (ref 70–99)
Glucose-Capillary: 203 mg/dL — ABNORMAL HIGH (ref 70–99)

## 2020-06-10 MED ORDER — DEXAMETHASONE 2 MG PO TABS
2.0000 mg | ORAL_TABLET | Freq: Every day | ORAL | Status: DC
  Administered 2020-06-11 – 2020-06-15 (×5): 2 mg via ORAL
  Filled 2020-06-10 (×5): qty 1

## 2020-06-10 MED ORDER — INSULIN GLARGINE 100 UNIT/ML ~~LOC~~ SOLN
26.0000 [IU] | Freq: Every day | SUBCUTANEOUS | Status: DC
  Administered 2020-06-11 – 2020-06-15 (×5): 26 [IU] via SUBCUTANEOUS
  Filled 2020-06-10 (×5): qty 0.26

## 2020-06-10 MED ORDER — TOPIRAMATE 25 MG PO TABS
50.0000 mg | ORAL_TABLET | Freq: Two times a day (BID) | ORAL | Status: DC
  Administered 2020-06-10 – 2020-06-15 (×10): 50 mg via ORAL
  Filled 2020-06-10 (×10): qty 2

## 2020-06-10 NOTE — Progress Notes (Addendum)
[facility] PHYSICAL MEDICINE & REHABILITATION PROGRESS NOTE   Subjective/Complaints: Sleep has improved She finds the Dilaudid helps with her pain and the oxycodone and Tylenol do not. She has been trying to use the Dilaudid more regularly and her headaches have improved.  Continues to have vestibular symptoms  ROS: Patient denies fever, rash, sore throat, blurred vision, nausea, vomiting, diarrhea, cough, shortness of breath or chest pain, joint or back pain, or mood change. +dizziness with positional changes    Objective:   No results found. No results for input(s): WBC, HGB, HCT, PLT in the last 72 hours. No results for input(s): NA, K, CL, CO2, GLUCOSE, BUN, CREATININE, CALCIUM in the last 72 hours.  Intake/Output Summary (Last 24 hours) at 06/10/2020 1204 Last data filed at 06/10/2020 0732 Gross per 24 hour  Intake 360 ml  Output --  Net 360 ml        Physical Exam: Vital Signs Blood pressure 136/87, pulse 87, temperature (!) 97.4 F (36.3 C), temperature source Oral, resp. rate 18, height 5\' 8"  (1.727 m), last menstrual period 02/22/2019, SpO2 97 %. Gen: no distress, normal appearing HEENT: oral mucosa pink and moist, NCAT Cardio: Reg rate Chest: normal effort, normal rate of breathing Abd: soft, non-distended Ext: no edema Skin: intact Psych: pleasant and cooperative Skin: cervical incision CDI with sutures Neuro: Pt is cognitively appropriate with normal insight, memory, and awareness. Cranial nerves 2-12 are intact. Sensory exam is normal. Reflexes are 2+ in all 4's. Fine motor coordination is intac generally for finger to nose on right. Mild dysmetria, past pointing LUE.  No tremors. Motor function is grossly 5/5.  Musculoskeletal: Full ROM, No pain with AROM or PROM in the neck, trunk, or extremities. Posture appropriate   Assessment/Plan: 1. Functional deficits which require 3+ hours per day of interdisciplinary therapy in a comprehensive inpatient rehab  setting.  Physiatrist is providing close team supervision and 24 hour management of active medical problems listed below.  Physiatrist and rehab team continue to assess barriers to discharge/monitor patient progress toward functional and medical goals  Care Tool:  Bathing    Body parts bathed by patient: Right arm,Left arm,Chest,Abdomen,Front perineal area,Buttocks,Right upper leg,Left upper leg,Right lower leg,Left lower leg,Face (bathed in shower with port covered)         Bathing assist Assist Level: Contact Guard/Touching assist     Upper Body Dressing/Undressing Upper body dressing   What is the patient wearing?: Pull over shirt,Bra    Upper body assist Assist Level: Contact Guard/Touching assist    Lower Body Dressing/Undressing Lower body dressing      What is the patient wearing?: Underwear/pull up,Pants     Lower body assist Assist for lower body dressing: Contact Guard/Touching assist     Toileting Toileting    Toileting assist Assist for toileting: Supervision/Verbal cueing Assistive Device Comment: walker   Transfers Chair/bed transfer  Transfers assist     Chair/bed transfer assist level: Supervision/Verbal cueing Chair/bed transfer assistive device: Programmer, multimedia   Ambulation assist      Assist level: Supervision/Verbal cueing Assistive device: No Device Max distance: 50 feet   Walk 10 feet activity   Assist     Assist level: Minimal Assistance - Patient > 75% Assistive device: No Device   Walk 50 feet activity   Assist    Assist level: Minimal Assistance - Patient > 75% Assistive device: No Device    Walk 150 feet activity   Assist Walk 150 feet  activity did not occur: Safety/medical concerns         Walk 10 feet on uneven surface  activity   Assist     Assist level: Minimal Assistance - Patient > 75%     Wheelchair     Assist Will patient use wheelchair at discharge?: No    Wheelchair activity did not occur: N/A         Wheelchair 50 feet with 2 turns activity    Assist    Wheelchair 50 feet with 2 turns activity did not occur: N/A       Wheelchair 150 feet activity     Assist  Wheelchair 150 feet activity did not occur: N/A       Blood pressure 136/87, pulse 87, temperature (!) 97.4 F (36.3 C), temperature source Oral, resp. rate 18, height 5\' 8"  (1.727 m), last menstrual period 02/22/2019, SpO2 97 %.  Medical Problem List and Plan: 1.  Dizziness with blurred vision and decreased functional mobility secondary to left cerebellar intra-axial lesion, likely metastatic with history of colon cancer 2018.  Status post suboccipital craniectomy resection of cerebellar mass 05/29/2020.  Decadron tapered             -patient may  Shower, but try to cover suboccipital crani site             -ELOS/Goals: 7-10 days mod I  -ultimately if we need to transport to XRT for a session or two we can from inpatient rehab 2.  Antithrombotics: -DVT/anticoagulation: SCDs             -antiplatelet therapy: N/A 3. Pain Management: Oxycodone as needed-  -can use oral dilaudid for severe h/a  -did well with topamax, increased to 50mg  BID on 1/10 with goal of removing dilaudid   -headaches controlled at present 4. Mood: Luvox 300 mg nightly, Lamictal 150 mg daily             -antipsychotic agents: Seroquel 800 mg nightly  -pt sees Dr. Clovis Pu locally. Is interested in seeing neuropsychology as outpt. Admits to depression at times, definitely has her "moments" 5. Neuropsych: This patient is capable of making decisions on her own behalf. 6. Skin/Wound Care: Routine skin checks.   -op site looks great--dc sutures today 7. Fluids/Electrolytes/Nutrition: Routine in and outs   -intake adequate at present.   8.  Diabetes mellitus.  Hemoglobin A1c 9.6.  Glucophage 500 mg twice daily, NovoLog 6 units 3 times daily, Lantus insulin 20 units daily.  Check blood sugars  before meals and at bedtime  -cbg's have been poorly controlled in 200'-300's d/t decadron  -increase lantus to 25u daily, continue same novolog  CBG elevated to 384 last night: increase Lantus to 26U  -Tapered dexamethasone to 2mg  daily 9.  Hypothyroidism.  Synthroid 10.  Hyperlipidemia.  Lipitor 11.  GERD.  Protonix 12.  Obesity.  BMI 31.32.  Dietary follow-up 13.  OCD.  Continue Luvox 14. BPPV? Sent message to [place] and Jeneen Rinks to see if one of them may be able to perform vestibular eval 15. Orthostatic hypotension? BP ok so far.    -encouraged adequate PO 16. Constipation- added miralax prn   -sorbitol 1/6 with large bm  17. Treatment of L cerebellar mass - appointment for MRI 1/11, Dr Lisbeth Renshaw appt 1/12, and Radiation to start 1/13-    -cbc ok 1/7  -see above LOS: 4 days A FACE TO FACE EVALUATION WAS [place] 06/10/2020, 12:04 PM

## 2020-06-10 NOTE — Care Management (Signed)
[facility] Individual Statement of Services  Patient Name:  Casady Voshell  Date:  06/10/2020  Welcome to the [facility].  Our goal is to provide you with an individualized program based on your diagnosis and situation, designed to meet your specific needs.  With this comprehensive rehabilitation program, you will be expected to participate in at least 3 hours of rehabilitation therapies Monday-Friday, with modified therapy programming on the weekends.  Your rehabilitation program will include the following services:  Physical Therapy (PT), Occupational Therapy (OT), Speech Therapy (ST), 24 hour per day rehabilitation nursing, Therapeutic Recreaction (TR), Psychology, Neuropsychology, Care Coordinator, Rehabilitation Medicine, Nutrition Services, Pharmacy Services and Other  Weekly team conferences will be held on Tuesdays to discuss your progress.  Your Inpatient Rehabilitation Care Coordinator will talk with you frequently to get your input and to update you on team discussions.  Team conferences with you and your family in attendance may also be held.  Expected length of stay: 7-10 days  Overall anticipated outcome: Independent with an Assistive Device  Depending on your progress and recovery, your program may change. Your Inpatient Rehabilitation Care Coordinator will coordinate services and will keep you informed of any changes. Your Inpatient Rehabilitation Care Coordinator's name and contact numbers are listed  below.  The following services may also be recommended but are not provided by the [facility] will be made to provide these services after discharge if needed.  Arrangements include referral to agencies that provide these services.  Your insurance has been verified  to be:  [place]  Your primary doctor is:  Greig Right  Pertinent information will be shared with your doctor and your insurance company.  Inpatient Rehabilitation Care Coordinator:  Cathleen Corti [phone] or [phone]  Information discussed with and copy given to patient by: Rana Snare, 06/10/2020, 11:57 AM

## 2020-06-10 NOTE — Progress Notes (Signed)
Occupational Therapy Session Note  Patient Details  Name: Ginnie Marich MRN: [id number] Date of Birth: 1965-07-16  Today's Date: 06/10/2020 OT Individual Time: 4496-7591 OT Individual Time Calculation (min): 60 min    Short Term Goals: Week 1:  OT Short Term Goal 1 (Week 1): STG = LTG d/t ELOS  Skilled Therapeutic Interventions/Progress Updates:      Pt seen for BADL retraining of toileting, bathing, and dressing with a focus on balance, LE coordination and visual fixation to avoid dizziness.   Pt received in recliner with her mom present. Pt had thigh high TEDs on. Sitting BP 119/83, standing 92/65  Pt stated she felt well enough to ambulate to shower. Pt was able to doff TEDs and clothing, shower and don TEds and clothing all with S.  No scissoring observed and pt stated she did not feel dizzy.   After shower, blood pressure 106/78. Pt resting in recliner with all needs met.  Great progress today.  Therapy Documentation Precautions:  Precautions Precautions: Fall Precaution Comments: orthostatic hypotension Restrictions Weight Bearing Restrictions: No  Pain: pt did have pain from surgical site, requested pain medication that nursing provided   ADL: ADL Eating: Independent Grooming: Setup Where Assessed-Grooming: (P) Standing at sink Upper Body Bathing: Modified independent Where Assessed-Upper Body Bathing: Shower Lower Body Bathing: Supervision/safety Where Assessed-Lower Body Bathing: Shower Upper Body Dressing: Supervision/safety Where Assessed-Upper Body Dressing: Edge of bed Lower Body Dressing: Supervision/safety Where Assessed-Lower Body Dressing: Edge of bed Toileting: Supervision/safety Where Assessed-Toileting: Glass blower/designer: Close supervision Armed forces technical officer Method: Magazine features editor: Close supervision Social research officer, government Method: Heritage manager: Shower seat with back      Therapy/Group:  Individual Therapy  [place] 06/10/2020, 1:40 PM

## 2020-06-10 NOTE — Progress Notes (Signed)
Physical Therapy Session Note  Patient Details  Name: Shannon [name] MRN: [id number] Date of Birth: 1966/02/28  Today's Date: 06/10/2020 PT Individual Time: 1300-1415 PT Individual Time Calculation (min): 75 min   Short Term Goals: Week 1:  PT Short Term Goal 1 (Week 1): STG = LTG due to short LOS  Skilled Therapeutic Interventions/Progress Updates:    Pt received seated in recliner in room, agreeable to PT session. Pt reports 6/10 pain in her head and posterior neck along incision, premedicated prior to start of therapy session. Seated BP 109/74 with thigh-high TEDs in place. Sit to stand with Supervision. Standing BP 83/58 with some complaints of dizziness. Transport via w/c to therapy gym due to low BP and pt symptomatic. Ambulation x 90 ft with no AD and CGA with minimal complaints of dizziness. Standing BP following gait 93/72. Use of BITS for visual scanning and tracing task with pt scoring 1.6 sec reaction time with use of LUE, 1.76 with RUE while in standing with no UE support and CGA. Added in stance on airex for increased challenge, pt score 1.86 sec with LUE and 1.61 with RUE with CGA again needed for balance. Standing alt L/R 4" step-taps progressing to step-ups with min A needed for balance, x 10 reps B. Side step with cone taps 2 x 15 ft with no AD and min A for balance, pt has increase in headache due to nature of task requiring pt to flex her head. Ambulation 4 x 100 ft with no AD and CGA to min A while performing vertical and horizontal head movements. Pt exhibits decreased balance with some ataxia and scissoring during horizontal head turns, minimal change in balance noted with vertical head turns. Pt returned to recliner at end of session, needs in reach, chair alarm in place.  Therapy Documentation Precautions:  Precautions Precautions: Fall Precaution Comments: orthostatic hypotension Restrictions Weight Bearing Restrictions: No    Therapy/Group: Individual  Therapy   Excell Seltzer, PT, DPT  06/10/2020, 5:31 PM

## 2020-06-10 NOTE — Progress Notes (Signed)
Speech Language Pathology Daily Session Note  Patient Details  Name: Shannon [name] MRN: [id number] Date of Birth: 08-26-1965  Today's Date: 06/10/2020 SLP Individual Time: 0830-0930 SLP Individual Time Calculation (min): 60 min  Short Term Goals: Week 1: SLP Short Term Goal 1 (Week 1): STGs=LTGs due to ELOS  Skilled Therapeutic Interventions: Skilled treatment session focused on cognitive goals. Upon arrival, patient was awake in bed. Patient reported issues with orthostatic BP this weekend and independently donned her thigh high TED hoes an socks. Patient transferred to the recliner with supervision to maximize arousal. SLP facilitated session by providing supervision level verbal cues for error awareness during a complex medication management task. SLP also facilitated session by helping patient develop a "system" to help with bill paying on time as well as developing a "daily routine" to help facilitate independence and overall function. Patient left upright in recliner with alarm on and all needs within reach. Continue with current plan of care.      Pain No/Denies Pain   Therapy/Group: Individual Therapy  Demarri Elie 06/10/2020, 12:38 PM

## 2020-06-10 NOTE — Progress Notes (Signed)
Inpatient Diabetes Program Recommendations  AACE/ADA: New Consensus Statement on Inpatient Glycemic Control (2015)  Target Ranges:  Prepandial:   less than 140 mg/dL      Peak postprandial:   less than 180 mg/dL (1-2 hours)      Critically ill patients:  140 - 180 mg/dL   Lab Results  Component Value Date   GLUCAP 292 (H) 06/10/2020   HGBA1C 9.7 (H) 06/02/2020    Review of Glycemic Control  Diabetes history: DM2 Outpatient Diabetes medications: Lantus 45 units daily  Trulicity 1.5 mg every Monday Novolog 0-12 units TID Metformin 500 mg QAM & 1000 mg QPM  Inpatient Diabetes Program Recommendations:   Noted Lantus increased to 25 units qd  -Increase Novolog meal coverage to 8 units tid if eats 50% -Change Novolog correction to 0-15 units tid + hs 0-5 units Secure chat sent to Dr. Naaman Plummer.  Will plan to discuss CGM with patient prior to D/C.  Thank you, Nani Gasser. Gift Rueckert, RN, MSN, CDE  Diabetes Coordinator Inpatient Glycemic Control Team Team Pager [phone] (8am-5pm) 06/10/2020 9:02 AM

## 2020-06-11 ENCOUNTER — Inpatient Hospital Stay (HOSPITAL_COMMUNITY): Payer: PRIVATE HEALTH INSURANCE | Admitting: Occupational Therapy

## 2020-06-11 ENCOUNTER — Inpatient Hospital Stay (HOSPITAL_COMMUNITY): Payer: PRIVATE HEALTH INSURANCE

## 2020-06-11 ENCOUNTER — Inpatient Hospital Stay (HOSPITAL_COMMUNITY): Payer: PRIVATE HEALTH INSURANCE | Admitting: Speech Pathology

## 2020-06-11 ENCOUNTER — Telehealth: Payer: Self-pay | Admitting: Radiation Therapy

## 2020-06-11 ENCOUNTER — Ambulatory Visit: Payer: PRIVATE HEALTH INSURANCE | Admitting: Radiation Oncology

## 2020-06-11 LAB — GLUCOSE, CAPILLARY
Glucose-Capillary: 184 mg/dL — ABNORMAL HIGH (ref 70–99)
Glucose-Capillary: 209 mg/dL — ABNORMAL HIGH (ref 70–99)
Glucose-Capillary: 318 mg/dL — ABNORMAL HIGH (ref 70–99)
Glucose-Capillary: 225 mg/dL — ABNORMAL HIGH (ref 70–99)

## 2020-06-11 MED ORDER — SODIUM CHLORIDE 0.9% FLUSH: 10.0000 mL | Freq: Two times a day (BID) | INTRAVENOUS | Status: DC

## 2020-06-11 MED ORDER — SODIUM CHLORIDE 0.9% FLUSH
10.0000 mL | INTRAVENOUS | Status: DC | PRN
Start: 2020-06-11 — End: 2020-06-15

## 2020-06-11 NOTE — Progress Notes (Signed)
[facility] PHYSICAL MEDICINE & REHABILITATION PROGRESS NOTE   Subjective/Complaints: Headaches about a "6". Still using dilaudid. Awaiting MRI today  ROS: Patient denies fever, rash, sore throat, blurred vision, nausea, vomiting, diarrhea, cough, shortness of breath or chest pain, joint or back pain,   or mood change.   Objective:   No results found. No results for input(s): WBC, HGB, HCT, PLT in the last 72 hours. No results for input(s): NA, K, CL, CO2, GLUCOSE, BUN, CREATININE, CALCIUM in the last 72 hours.  Intake/Output Summary (Last 24 hours) at 06/11/2020 1039 Last data filed at 06/11/2020 0754 Gross per 24 hour  Intake 720 ml  Output --  Net 720 ml        Physical Exam: Vital Signs Blood pressure 109/69, pulse 94, temperature 97.8 F (36.6 C), temperature source Oral, resp. rate 16, height 5\' 8"  (1.727 m), last menstrual period 02/22/2019, SpO2 97 %. Constitutional: No distress . Vital signs reviewed. HEENT: EOMI, oral membranes moist Neck: supple Cardiovascular: RRR without murmur. No JVD    Respiratory/Chest: CTA Bilaterally without wheezes or rales. Normal effort    GI/Abdomen: BS +, non-tender, non-distended Ext: no clubbing, cyanosis, or edema Psych: pleasant and cooperative Skin: cervical incision CDI with sutures now removed Neuro: Pt is cognitively appropriate with normal insight, memory, and awareness. Cranial nerves 2-12 are intact. Sensory exam is normal. Reflexes are 2+ in all 4's. Fine motor coordination is intac generally for finger to nose on right. Mild dysmetria, past pointing LUE.  No tremors. Motor function is grossly 5/5.  Musculoskeletal: Full ROM, No pain with AROM or PROM in the neck, trunk, or extremities. Posture appropriate   Assessment/Plan: 1. Functional deficits which require 3+ hours per day of interdisciplinary therapy in a comprehensive inpatient rehab setting.  Physiatrist is providing close team supervision and 24 hour management of  active medical problems listed below.  Physiatrist and rehab team continue to assess barriers to discharge/monitor patient progress toward functional and medical goals  Care Tool:  Bathing    Body parts bathed by patient: Right arm,Left arm,Chest,Abdomen,Front perineal area,Buttocks,Right upper leg,Left upper leg,Right lower leg,Left lower leg,Face         Bathing assist Assist Level: Supervision/Verbal cueing (distant S, therapist on other side of curtain)     Upper Body Dressing/Undressing Upper body dressing   What is the patient wearing?: Pull over shirt,Bra    Upper body assist Assist Level: Supervision/Verbal cueing    Lower Body Dressing/Undressing Lower body dressing      What is the patient wearing?: Underwear/pull up,Pants     Lower body assist Assist for lower body dressing: Supervision/Verbal cueing     Toileting Toileting    Toileting assist Assist for toileting: Supervision/Verbal cueing Assistive Device Comment: walker   Transfers Chair/bed transfer  Transfers assist     Chair/bed transfer assist level: Supervision/Verbal cueing Chair/bed transfer assistive device: Programmer, multimedia   Ambulation assist      Assist level: Contact Guard/Touching assist Assistive device: No Device Max distance: 150'   Walk 10 feet activity   Assist     Assist level: Contact Guard/Touching assist Assistive device: No Device   Walk 50 feet activity   Assist    Assist level: Contact Guard/Touching assist Assistive device: No Device    Walk 150 feet activity   Assist Walk 150 feet activity did not occur: Safety/medical concerns  Assist level: Contact Guard/Touching assist Assistive device: No Device    Walk 10 feet  on uneven surface  activity   Assist     Assist level: Minimal Assistance - Patient > 75%     Wheelchair     Assist Will patient use wheelchair at discharge?: No   Wheelchair activity did not occur:  N/A         Wheelchair 50 feet with 2 turns activity    Assist    Wheelchair 50 feet with 2 turns activity did not occur: N/A       Wheelchair 150 feet activity     Assist  Wheelchair 150 feet activity did not occur: N/A       Blood pressure 109/69, pulse 94, temperature 97.8 F (36.6 C), temperature source Oral, resp. rate 16, height 5\' 8"  (1.727 m), last menstrual period 02/22/2019, SpO2 97 %.  Medical Problem List and Plan: 1.  Dizziness with blurred vision and decreased functional mobility secondary to left cerebellar intra-axial lesion, likely metastatic with history of colon cancer 2018.  Status post suboccipital craniectomy resection of cerebellar mass 05/29/2020.  Decadron tapered             -patient may  Shower, but try to cover suboccipital crani site             -ELOS/Goals: 7-10 days mod I  -it appears MRI/XRT delayed to allow time for effects of iron infusion to clear 2.  Antithrombotics: -DVT/anticoagulation: SCDs             -antiplatelet therapy: N/A 3. Pain Management: Oxycodone as needed-  -can use oral dilaudid for severe h/a  - topamax, increased to 50mg  BID on 1/10 with goal still of removing dilaudid   -headaches improving 4. Mood: Luvox 300 mg nightly, Lamictal 150 mg daily             -antipsychotic agents: Seroquel 800 mg nightly  -pt sees Dr. Clovis Pu locally. Is interested in seeing neuropsychology as outpt. Admits to depression at times, definitely has her "moments" 5. Neuropsych: This patient is capable of making decisions on her own behalf. 6. Skin/Wound Care: Routine skin checks.   -sutures out 7. Fluids/Electrolytes/Nutrition: Routine in and outs   -intake adequate at present.   8.  Diabetes mellitus.  Hemoglobin A1c 9.6.  Glucophage 500 mg twice daily, NovoLog 6 units 3 times daily, Lantus insulin 20 units daily.  Check blood sugars before meals and at bedtime  -cbg's have been poorly controlled in 200'-300's d/t  decadron  -increased lantus to 26u daily on 1/10, continue same novolog    -some improvement last 24 hours  -Tapered dexamethasone to 2mg  daily on 1/10---make sure NS wants to taper this  9.  Hypothyroidism.  Synthroid 10.  Hyperlipidemia.  Lipitor 11.  GERD.  Protonix 12.  Obesity.  BMI 31.32.  Dietary follow-up 13.  OCD.  Continue Luvox 14. BPPV? Sent message to [place] and Jeneen Rinks to see if one of them may be able to perform vestibular eval 15. Orthostatic hypotension? BP ok so far.    -encouraged adequate PO  -THT's, binder if needed 16. Constipation- added miralax prn   -sorbitol 1/6 with large bm  17. Treatment of L cerebellar mass - appointment for MRI 1/11, Dr Lisbeth Renshaw appt 1/12, and Radiation to start 1/13- holding as above   -see above LOS: 5 days A FACE TO [place] 06/11/2020, 10:39 AM

## 2020-06-11 NOTE — Progress Notes (Signed)
Patient ID: Megin Consalvo, female   DOB: September 04, 1965, 55 y.o.   MRN: [id number]   SW received updates from Mont Dutton reporting pt upcoming MRI this afternoon, and S[facility] treatment appointment for 1/12 were cancelled due to the effects of her iron infusion will need to resolve. Pt will have telephone conference for consult tomorrow instead. Please refer to note on 1/11. SW informed medical team.  SW cancelled [facility] transportation for patient (1/12 and 1/13).  SW met with pt and pt mother in room to provide updates from team conference, and d/c date 1/15. Pt informed once there are d/c recommendations, SW will provide. Pt prefers to have outpatient therapy if possible.   Loralee Pacas, MSW, [place] Office: [phone] Cell: [phone] Fax: [phone]

## 2020-06-11 NOTE — Telephone Encounter (Signed)
I spoke with the pt and her mother to let them know the [facility] treatment planning will be delayed a couple weeks to allow time for the effects from her iron infusion to clear, before doing another brain MRI. The brain MRI planned for later today is cancelled, and the consult tomorrow will not be via telephone instead of bringing her to the cancer center in person. I also spoke with the floor nurse about this change so care link travel will be cancelled for tomorrow.   Mont Dutton R.T.(R)(T) Radiation Special Procedures Navigator

## 2020-06-11 NOTE — Patient Care Conference (Signed)
Inpatient RehabilitationTeam Conference and Plan of Care Update Date: 06/11/2020   Time: 10:37 AM    Patient Name: Shannon [name]      Medical Record Number: [id number]  Date of Birth: February 28, 1966 Sex: Female         Room/Bed: 4W19C/4W19C-01 Payor Info: Payor: [employer]ST / Plan: [employer]ST / Product Type: *No Product type* /    Admit Date/Time:  06/06/2020  1:46 PM  Primary Diagnosis:  Cerebellar mass  Hospital Problems: Principal Problem:   Cerebellar mass    Expected Discharge Date: Expected Discharge Date: 06/15/20  Team Members Present: Physician leading conference: Dr. Alger Simons Care Coodinator Present: Loralee Pacas, LCSWA;Derrian Rodak Creig Hines, RN, BSN, [place] Nurse Present: Benjie Karvonen, RN PT Present: Tereasa Coop, PT OT Present: Cherylynn Ridges, OT SLP Present: Weston Anna, SLP PPS Coordinator present : Gunnar Fusi, SLP     Current Status/Progress Goal Weekly Team Focus  Bowel/Bladder   continent of B/B. last BM-1/8  remain cont  assess q shift and PRN   Swallow/Nutrition/ Hydration             ADL's   supervision self care, improving balance  Mod I self care; supervision homemanagement  balance, visual stabilization, activity tolerance, ADL training, pt education   Mobility   supervision to CGA for bed mobility, transfers, and ambulation up to 100' without AD  mod(I)  ambulation, balance, coordination, transfers, DC prep   Communication             Safety/Cognition/ Behavioral Observations  Supervision  Mod I  complex problem solving and organization, recall with use of strategies   Pain   c/o pain 7 of 10 -headache  pain<3  assess pain q shift and PRN   Skin   incision head posterior  remain free from breakdown  assess skin q shift and PRN     Discharge Planning:  Pt to discharge to home with support from her mother and dtr. Primary support will be her mother. Pt has upcoming radiation treatment on 1/12. TBD if additional treatment will be needed  1/13;1/17 and 1/19.   Team Discussion: Continent B/B, incision looks good, has complaints of pain and is medicated appropriately. MRI scheduled for today was canceled and radiation postponed due to an Iron infusion. Patient on target to meet rehab goals: Patient is supervision for self-care with mod I goals. Patient is supervision to contact guard for bed mobility, transfers, and ambulation up to 100' without AD. She has mod I goals. She is working on complex problem solving, recall, and organization with supervision. She has mod I goals.   *See Care Plan and progress notes for long and short-term goals.   Revisions to Treatment Plan:    Teaching Needs: Family education, wound/skin care  Current Barriers to Discharge: Decreased caregiver support, Home enviroment access/layout, Wound care, Lack of/limited family support, Medication compliance, Pending chemo/radiation and Behavior  Possible Resolutions to Barriers: Continue current medications, provide emotional support to patient and family.     Medical Summary Current Status: cerebellar mass (colon primary), s/p crani and resection, headaches/pain control. hypotension at times  Barriers to Discharge: Medical stability   Possible Resolutions to Barriers/Weekly Focus: pain control, mri/xrt pending   Continued Need for Acute Rehabilitation Level of Care: The patient requires daily medical management by a physician with specialized training in physical medicine and rehabilitation for the following reasons: Direction of a multidisciplinary physical rehabilitation program to maximize functional independence : Yes Medical management of patient stability for  increased activity during participation in an intensive rehabilitation regime.: Yes Analysis of laboratory values and/or radiology reports with any subsequent need for medication adjustment and/or medical intervention. : Yes   I attest that I was present, lead the team conference, and  concur with the assessment and plan of the team.   Cristi Loron 06/11/2020, 5:00 PM

## 2020-06-11 NOTE — Progress Notes (Signed)
Occupational Therapy Session Note  Patient Details  Name: Shannon [name] MRN: [id number] Date of Birth: 09/16/1965  Today's Date: 06/11/2020 OT Individual Time: 1345-1430 OT Individual Time Calculation (min): 45 min  and Today's Date: 06/11/2020 OT Missed Time: 15 Minutes Missed Time Reason: Patient fatigue   Short Term Goals: Week 1:  OT Short Term Goal 1 (Week 1): STG = LTG d/t ELOS  Skilled Therapeutic Interventions/Progress Updates:  OT attempted to see patient at scheduled time this morning, pt pt reported fatigue and feeling overwhelmed. Pt declined participation at that time.   OT returned later in the afternoon.    Pt greeted seated in recliner and agreeable to OT treatment session. BP taken as noted below. No orthostasis noted throughout session. Sitting 104/71  Standing 97/60 Pt ambulated to wc in room with CGA. Pt brought down to therapy gym and worked on standing balance/endurance. Pt stood on foam block while reaching behind her to the L and R to get cards and place on velcro mat. Pt with slight dizziness with head turns, but improved with repetition. UB there-ex with 3 sets of 10 chest press, bicep curl, triceps press, straight arm raises using 4 lb dowel rod and standing on foam. Pt returned to room and left seated in recliner with chair alarm on, call bell in reach, and needs met.   Therapy Documentation Precautions:  Precautions Precautions: Fall Precaution Comments: orthostatic hypotension Restrictions Weight Bearing Restrictions: No General: General OT Amount of Missed Time: 15 Minutes Pain: Pain Assessment Faces Pain Scale: Hurts even more Pain Type: Acute pain Pain Location: Neck Pain Orientation: Posterior Pain Descriptors / Indicators: Tender Pain Onset: With Activity Pain Intervention(s): Repositioned;Rest  Therapy/Group: Individual Therapy  Valma Cava 06/11/2020, 2:47 PM

## 2020-06-11 NOTE — Progress Notes (Signed)
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  MRI Brain 05/27/2020: Atypical exam due to the presence of the previous Feraheme injection.  3.1 x 3.2 cm solitary metastasis in the cerebellum with extensive signal loss of the non necrotic tumor due to the penetration of the Feraheme in high amounts.  Extensive edema in the left cerebellu with mass effect upon the fourth ventricle and obstructive hydrocephalus of the lateral and third ventricles.  CT Head 05/27/2020: 3.8 x 3.4 x 4.2 cm peripherally enhancing and centrally necrotic mass within the left cerebellum with moderate surrounding edema.  64mm enhancing focus adjacent to the dominant left cerebellar lesion which may reflect a pedunculated extension of the same lesion or a small adjacent lesion.  Pathology: Left Cerebellar Brain Mass 05/29/2020   Past or anticipated interventions, if any, per neurosurgery:  Dr. Saintclair Halsted 05/29/2020 -Suboccipital Craniectomy for resection of left cerebellar mass   Past or anticipated interventions, if any, per medical oncology:  Dr. Mickeal Skinner 07/02/2020  Dose of Decadron, if applicable: 2 mg daily  Recent neurologic symptoms, if any:   Seizures: No  Headaches: Yes, constant  Nausea: No  Dizziness/ataxia: She has baseline positional dizziness.    Difficulty with hand coordination: Unstable gait.  Focal numbness/weakness: Bilateral generalized weakness  Visual deficits/changes: blurred vision  Confusion/Memory deficits: No    SAFETY ISSUES:  Prior radiation? No  Pacemaker/ICD? No  Possible current pregnancy? Hysterectomy  Is the patient on methotrexate? No  Additional Complaints / other details:

## 2020-06-11 NOTE — Progress Notes (Signed)
Speech Language Pathology Daily Session Note  Patient Details  Name: Shannon [name] MRN: [id number] Date of Birth: [birth year]  Today's Date: 06/11/2020 SLP Individual Time: 0700-0755 SLP Individual Time Calculation (min): 55 min  Short Term Goals: Week 1: SLP Short Term Goal 1 (Week 1): STGs=LTGs due to ELOS  Skilled Therapeutic Interventions: Skilled treatment session focused on cognitive goals. Upon arrival, patient was awake while upright in the bed. Patient transferred to the recliner and independently applied TED hoes. SLP facilitated session by providing supervision level verbal cues for organizing a list of medications by time. However, she was overall Mod I for organizing a QD pill box. SLP also assisted the patient with generating a list of modifications she can initially make to help ease the return to work if that is the recommendation or right choice for patient. Patient left upright in recliner with alarm on and all needs within reach. Continue with current plan of care.     Pain Pain Assessment Pain Scale: 0-10 Pain Score: 6  Pain Type: Acute pain Pain Location: Head Pain Descriptors / Indicators: Headache Pain Frequency: Constant Pain Onset: On-going Pain Intervention(s): Repositioned;Rest  Therapy/Group: Individual Therapy  Shannon [name] 06/11/2020, 10:14 AM

## 2020-06-11 NOTE — Progress Notes (Signed)
Physical Therapy Session Note  Patient Details  Name: Shannon [name] MRN: [id number] Date of Birth: 1965/11/04  Today's Date: 06/11/2020 PT Individual Time: 1030-1130 PT Individual Time Calculation (min): 60 min   Short Term Goals: Week 1:  PT Short Term Goal 1 (Week 1): STG = LTG due to short LOS  Skilled Therapeutic Interventions/Progress Updates:    Patient seen at the bedside for vestibular evaluation as noted below.  Discussed implementing some habituation techniques and plan to issue HEP as well as follow up with her prior to d/c.  Does seems as if she has had a positional component previous to this episode, but noted no nystagmus with positional testing today.  Also discussed continued work on fluid intake and utilizing compensatory techniques due to orthostatic hypotension. Completed MST with quotient of 27/100.   Vestibular Assessment - 06/11/20 1030      Symptom Behavior   Subjective history of current problem Reports 6 year history of immediate dizziness/imbalance/lightheadedness upon standing or sitting up from laying that would last for seconds  and she would have to hold on for a few seconds till it corrected itself.  Also had symptoms when turning over in bed with room spinning dizziness.  Recently past 6 weeks noting blacking out, falling and light headed more related to her tumor.  Cerebellar tumor resected on 12/29 with 90% improvement in symptoms but prior symptoms are still there.    Type of Dizziness  Imbalance;Spinning;Unsteady with head/body turns;Lightheadedness;Diplopia    Frequency of Dizziness intermittent    Duration of Dizziness seconds    Symptom Nature Intermittent;Spontaneous;Positional;Motion provoked    Aggravating Factors Rolling to left;Rolling to right;Sit to stand;Supine to sit;Turning head sideways;Turning head quickly;Turning body quickly;Moving eyes;Looking up to the ceiling    Relieving Factors Head stationary;Lying supine;Closing  eyes;Rest;Slow movements    Progression of Symptoms Better      Oculomotor Exam   Oculomotor Alignment Normal    Ocular ROM WFL, but straining per pt    Spontaneous Absent    Gaze-induced  Absent    Smooth Pursuits Intact    Saccades Intact   but diffcult per pt for hoizontal saccades     Oculomotor Exam-Fixation Suppressed    Left Head Impulse negative for refixation saccade    Right Head Impulse negative for refixation saccade      Vestibulo-Ocular Reflex   VOR 1 Head Only (x 1 viewing) intact horizontal and vertical head movements >30 sec and no increase in symptoms    VOR Cancellation Normal      Auditory   Comments intact to scratch test and equal bilaterally      Positional Testing   Dix-Hallpike Dix-Hallpike Right;Dix-Hallpike Left    Sidelying Test Sidelying Right;Sidelying Left    Horizontal Canal Testing Horizontal Canal Right;Horizontal Canal Left      Dix-Hallpike Right   Dix-Hallpike Right Duration 30 sec    Dix-Hallpike Right Symptoms No nystagmus      Dix-Hallpike Left   Dix-Hallpike Left Duration 30 sec    Dix-Hallpike Left Symptoms No nystagmus      Sidelying Right   Sidelying Right Duration 30 sec    Sidelying Right Symptoms No nystagmus      Sidelying Left   Sidelying Left Duration 30 sec    Sidelying Left Symptoms No nystagmus      Horizontal Canal Right   Horizontal Canal Right Duration 1 minute    Horizontal Canal Right Symptoms Normal      Horizontal  Canal Left   Horizontal Canal Left Duration 1 min    Horizontal Canal Left Symptoms Normal      Positional Sensitivities   Sit to Supine No dizziness    Supine to Left Side Lightheadedness   1 sec   Supine to Right Side Lightheadedness   1 sec   Supine to Sitting Lightheadedness   1 sec   Right Hallpike Moderate dizziness   10 sed   Up from Right Hallpike Moderate dizziness   10 sed   Up from Left Hallpike Moderate dizziness   10 s   Nose to Right Knee No dizziness    Right Knee to  Sitting Moderate dizziness   12 sec   Nose to Left Knee No dizziness    Left Knee to Sitting Moderate dizziness   20 sed   Head Turning x 5 Moderate dizziness   10 sec   Head Nodding x 5 Mild dizziness   2-3 sec   Pivot Right in Standing No dizziness    Pivot Left in Standing Lightheadedness   20 sec   Rolling Right Moderate dizziness   25 sec   Rolling Left Mild dizziness   10 s   Positional Sensitivities Comments sit>stand 3/5 lasting 1 min      Orthostatics   BP sitting 106/79    HR sitting 91    BP standing (after 1 minute) 89/65    HR standing (after 1 minute) 99    BP standing (after 3 minutes) 79/67    HR standing (after 3 minutes) 105    Orthostatics Comment symptomatic light headedness          Patient left sitting in recliner with  Mother in the room and chair alarm activated.  Therapy Documentation Precautions:  Precautions Precautions: Fall Precaution Comments: orthostatic hypotension Restrictions Weight Bearing Restrictions: No Pain: Pain Assessment Pain Scale: 0-10 Pain Score: 6  Faces Pain Scale: Hurts even more Pain Type: Acute pain Pain Location: Neck Pain Orientation: Posterior Pain Descriptors / Indicators: Tender Pain Frequency: Constant Pain Onset: With Activity Pain Intervention(s): Repositioned;Rest    Therapy/Group: Individual Therapy  Reginia Naas  Magda Kiel, PT 06/11/2020, 12:06 PM

## 2020-06-12 ENCOUNTER — Ambulatory Visit: Payer: PRIVATE HEALTH INSURANCE

## 2020-06-12 ENCOUNTER — Ambulatory Visit
Admit: 2020-06-12 | Discharge: 2020-06-12 | Disposition: A | Discharge status: Home / Self Care | Payer: PRIVATE HEALTH INSURANCE | Attending: Radiation Oncology | Admitting: Radiation Oncology

## 2020-06-12 ENCOUNTER — Ambulatory Visit: Payer: PRIVATE HEALTH INSURANCE | Admitting: Radiation Oncology

## 2020-06-12 ENCOUNTER — Ambulatory Visit
Admission: RE | Admit: 2020-06-12 | Discharge: 2020-06-12 | Disposition: A | Discharge status: Home / Self Care | Payer: PRIVATE HEALTH INSURANCE | Source: Ambulatory Visit | Attending: Radiation Oncology | Admitting: Radiation Oncology

## 2020-06-12 ENCOUNTER — Encounter: Payer: Self-pay | Admitting: Radiation Oncology

## 2020-06-12 DIAGNOSIS — C7A022 Malignant carcinoid tumor of the ascending colon: Secondary | ICD-10-CM

## 2020-06-12 DIAGNOSIS — C7931 Secondary malignant neoplasm of brain: Secondary | ICD-10-CM | POA: Insufficient documentation

## 2020-06-12 LAB — GLUCOSE, CAPILLARY
Glucose-Capillary: 139 mg/dL — ABNORMAL HIGH (ref 70–99)
Glucose-Capillary: 151 mg/dL — ABNORMAL HIGH (ref 70–99)
Glucose-Capillary: 277 mg/dL — ABNORMAL HIGH (ref 70–99)
Glucose-Capillary: 228 mg/dL — ABNORMAL HIGH (ref 70–99)

## 2020-06-12 MED ORDER — SENNOSIDES-DOCUSATE SODIUM 8.6-50 MG PO TABS
2.0000 | ORAL_TABLET | Freq: Every day | ORAL | Status: DC
  Administered 2020-06-12 – 2020-06-14 (×3): 2 via ORAL
  Filled 2020-06-12 (×3): qty 2

## 2020-06-12 MED ORDER — HYDROMORPHONE HCL 2 MG PO TABS
2.0000 mg | ORAL_TABLET | Freq: Every evening | ORAL | Status: DC | PRN
  Administered 2020-06-12 – 2020-06-14 (×3): 2 mg via ORAL
  Filled 2020-06-12 (×3): qty 1

## 2020-06-12 MED ORDER — POLYETHYLENE GLYCOL 3350 17 G PO PACK
17.0000 g | PACK | Freq: Every day | ORAL | Status: DC
  Administered 2020-06-12: 17 g via ORAL
  Filled 2020-06-12 (×3): qty 1

## 2020-06-12 MED ORDER — LORAZEPAM 0.5 MG PO TABS: ORAL_TABLET | ORAL | 0 refills | Status: DC

## 2020-06-12 NOTE — Progress Notes (Signed)
Speech Language Pathology Daily Session Note  Patient Details  Name: Shannon [name] MRN: [id number] Date of Birth: 19-Sep-1965  Today's Date: 06/12/2020 SLP Individual Time: 1425-1450 SLP Individual Time Calculation (min): 25 min  Short Term Goals: Week 1: SLP Short Term Goal 1 (Week 1): STGs=LTGs due to ELOS  Skilled Therapeutic Interventions: Skilled treatment session focused on cognitive goals. SLP facilitated session by providing extra time and overall supervision level verbal cues for organization and problem solving during a complex scheduling task. Patient also showed this clinician how she is organizing her personal information in preparation for home.  Patient left upright in recliner with alarm on and all needs within reach. Continue with current plan of care.      Pain No/Denies Pain   Therapy/Group: Individual Therapy  Chamille Werntz, [place] 06/12/2020, 3:52 PM

## 2020-06-12 NOTE — Progress Notes (Signed)
Radiation Oncology         [phone] ________________________________  Initial Outpatient Consultation - Conducted via telephone due to current COVID-19 concerns for limiting patient exposure  I spoke with the patient to conduct this consult visit via telephone to spare the patient unnecessary potential exposure in the healthcare setting during the current COVID-19 pandemic. The patient was notified in advance and was offered a [place] meeting to allow for face to face communication but unfortunately reported that they did not have the appropriate resources/technology to support such a visit and instead preferred to proceed with a telephone consult.    Name: Shannon [name]        MRN: [id number]  Date of Service: 06/12/2020 DOB: 11-Sep-1965  [place]:[id number], Olin Hauser, MD  Ventura Sellers, MD     REFERRING PHYSICIAN: Ventura Sellers, MD   DIAGNOSIS: The encounter diagnosis was Brain metastases [facility]).   HISTORY OF PRESENT ILLNESS: Shannon [name] is a 55 y.o. female seen at the request of Dr. Saintclair Halsted for newly diagnosed brain metastases with a history of adenocarcinoma of the right colon for which she underwent a right hemicolectomy in October 2018, she had node positive disease and received 12 cycles of adjuvant FOLFOX which she completed in April 2019.  She has been without evidence of disease, she saw Dr. Bobby Rumpf in November and had been doing well, but she had some diarrhea and nausea.  She was continuing to follow-up with GI and [facility] for these issues.  She developed progressive nausea and headache as well as ataxia and was seen in the emergency department In [place] which revealed a 3.8 cm enhancing and necrotic mass in the left cerebellum with surrounding edema.  She was transferred after an MRI scan also confirmed this concern, the MRI scan on 05/27/2020 showed a solitary mass in the cerebellum measuring 3.1 x 3.2 cm with central necrosis and extensive signal loss due to  penetration of Feraheme which she had bradycardia previously received.  A second mass lesion was not identified but there was mass-effect on the fourth ventricle with obstructive hydrocephalus of the lateral and third ventricles.  She was transferred to [facility] and subsequently underwent surgical resection with suboccipital craniectomy on 05/29/2020.  Her postoperative imaging with an MRI of the brain on 05/30/2020 showed interval resection of the left cerebellar metastasis with again limited post contrast imaging from her prior Feraheme injection no specific evidence of residual tumor was identified, and persistent cerebellar edema was noted with decreased mass-effect and decreased effacement of the ventricle.  She has been recovering in the rehabilitation waiting of the hospital at [facility], and has improved significantly, her case has been discussed in the multidisciplinary brain oncology conference and she is contacted via telephone for consultation regarding postoperative SRS.     PREVIOUS RADIATION THERAPY: No   PAST MEDICAL HISTORY:  Past Medical History:  Diagnosis Date  . Anemia    Iron De  . Anxiety   . Blood clot in vein   . Bowel obstruction ([place])   . Colon cancer ([place]) 2018   treated with surgery and radiation  . Depression   . Diabetes mellitus without complication ([place])    Type II  . DVT (deep venous thrombosis) ([place]) 2019   behind right knee- while she wasa on chemo  . GERD (gastroesophageal reflux disease)   . History of blood transfusion   . History of chemotherapy   . History of kidney stones 2012   passed  .  Hypothyroidism   . Obesity        PAST SURGICAL HISTORY: Past Surgical History:  Procedure Laterality Date  . ABDOMINAL HYSTERECTOMY  05/2019   total laparoscopin with tubes and ovariers removed also.  . APPENDECTOMY  05/2019  . COLON SURGERY  03/2017   Colon resection  . COLONOSCOPY  2019  . SUBOCCIPITAL CRANIECTOMY CERVICAL LAMINECTOMY Left 05/29/2020    Procedure: Suboccipital Craniectomy for Resection of Left Cerebellar Mass;  Surgeon: Kary Kos, MD;  Location: [place];  Service: Neurosurgery;  Laterality: Left;     FAMILY HISTORY:  Family History  Problem Relation Age of Onset  . Irritable bowel syndrome Mother   . Colon polyps Father   . Breast cancer Maternal Grandmother   . Diabetes Maternal Grandmother   . Diabetes Maternal Grandfather   . Breast cancer Paternal Grandmother   . Colon polyps Maternal Uncle   . Stomach cancer Neg Hx   . Pancreatic cancer Neg Hx   . Esophageal cancer Neg Hx   . Colon cancer Neg Hx      SOCIAL HISTORY:  reports that she has never smoked. She has never used smokeless tobacco. She reports current alcohol use. She reports that she does not use drugs.  The patient is widowed and resides in [place].  She has an 50 year old daughter and her mother is very actively supporting her.  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   ALLERGIES: Clindamycin/lincomycin and Penicillins   MEDICATIONS:  No current facility-administered medications for this encounter.   No current outpatient medications on file.   Facility-Administered Medications Ordered in Other Encounters  Medication Dose Route Frequency Provider Last Rate Last Admin  . acetaminophen (TYLENOL) tablet 650 mg  650 mg Oral Q6H PRN Cathlyn Parsons, PA-C   650 mg at 06/09/20 1412  . atorvastatin (LIPITOR) tablet 10 mg  10 mg Oral QHS Cathlyn Parsons, PA-C   10 mg at 06/11/20 2057  . bisacodyl (DULCOLAX) EC tablet 5 mg  5 mg Oral Daily PRN Angiulli, Lavon Paganini, PA-C      . Chlorhexidine Gluconate Cloth 2 % PADS 6 each  6 each Topical Daily Meredith Staggers, MD   6 each at 06/12/20 [phone]  . dexamethasone (DECADRON) tablet 2 mg  2 mg Oral Daily Raulkar, Clide Deutscher, MD   2 mg at 06/12/20 1027  . fluvoxaMINE (LUVOX) tablet 300 mg  300 mg Oral QHS Cathlyn Parsons, PA-C   300 mg at 06/11/20 2056  . HYDROmorphone  (DILAUDID) tablet 2 mg  2 mg Oral QHS PRN Alger Simons T, MD      . insulin aspart (novoLOG) injection 0-9 Units  0-9 Units Subcutaneous TID WC Cathlyn Parsons, PA-C   1 Units at 06/12/20 0654  . insulin aspart (novoLOG) injection 6 Units  6 Units Subcutaneous TID WC Cathlyn Parsons, PA-C   6 Units at 06/12/20 [phone]  . insulin glargine (LANTUS) injection 26 Units  26 Units Subcutaneous Daily Raulkar, Clide Deutscher, MD   26 Units at 06/12/20 [phone]  . lamoTRIgine (LAMICTAL) tablet 150 mg  150 mg Oral QHS Cathlyn Parsons, PA-C   150 mg at 06/11/20 2058  . levothyroxine (SYNTHROID) tablet 75 mcg  75 mcg Oral Q0200 Cathlyn Parsons, PA-C   75 mcg at 06/12/20 0549  . metFORMIN (GLUCOPHAGE) tablet 500 mg  500 mg Oral BID WC AngiulliLavon Paganini, PA-C   500 mg at 06/12/20 3474  .  ondansetron (ZOFRAN) tablet 4 mg  4 mg Oral Q4H PRN Angiulli, Lavon Paganini, PA-C       Or  . ondansetron [facility]) injection 4 mg  4 mg Intravenous Q4H PRN Angiulli, Lavon Paganini, PA-C      . oxyCODONE (Oxy IR/ROXICODONE) immediate release tablet 5-10 mg  5-10 mg Oral Q4H PRN Cathlyn Parsons, PA-C   5 mg at 06/11/20 0549  . pantoprazole (PROTONIX) EC tablet 40 mg  40 mg Oral QHS Cathlyn Parsons, PA-C   40 mg at 06/11/20 2056  . polyethylene glycol (MIRALAX / GLYCOLAX) packet 17 g  17 g Oral Daily Alger Simons T, MD      . QUEtiapine (SEROQUEL) tablet 800 mg  800 mg Oral QHS Cathlyn Parsons, PA-C   800 mg at 06/11/20 2056  . senna-docusate (Senokot-S) tablet 2 tablet  2 tablet Oral QHS Alger Simons T, MD      . sodium chloride flush (NS) 0.9 % injection 10-40 mL  10-40 mL Intracatheter Q12H Alger Simons T, MD      . sodium chloride flush (NS) 0.9 % injection 10-40 mL  10-40 mL Intracatheter PRN Meredith Staggers, MD      . sorbitol 70 % solution 15 mL  15 mL Oral Daily PRN Cathlyn Parsons, PA-C   15 mL at 06/08/20 0732  . sorbitol 70 % solution 30 mL  30 mL Oral Once Lovorn, Megan, MD      . topiramate (TOPAMAX)  tablet 50 mg  50 mg Oral BID Raulkar, Clide Deutscher, MD   50 mg at 06/12/20 1610     REVIEW OF SYSTEMS: On review of systems, the patient reports that she is doing very well.  She denies any headaches, nausea or vomiting.  She is not experiencing any difficulty with seizure activity or uncontrolled movement.  She is working aggressively with physical therapy and feels like her movement is doing much better since surgery including her ability to ambulate.  She specifically denies any changes in her bowel or bladder activity, fevers or chills unintended weight loss or other notable symptoms since her last discussion with Dr. Bobby Rumpf.  PHYSICAL EXAM:  Wt Readings from Last 3 Encounters:  05/29/20 206 lb (93.4 kg)  05/23/20 191 lb 8 oz (86.9 kg)  05/17/20 194 lb (88 kg)   Temp Readings from Last 3 Encounters:  06/11/20 98.2 F (36.8 C)  06/06/20 98 F (36.7 C) (Oral)  05/27/20 (!) 97.5 F (36.4 C) (Oral)   BP Readings from Last 3 Encounters:  06/12/20 105/70  06/06/20 110/73  05/27/20 (!) 126/94   Pulse Readings from Last 3 Encounters:  06/12/20 83  06/06/20 96  05/27/20 (!) 103  Unable to assess given encounter type  ECOG = 2  0 - Asymptomatic (Fully active, able to carry on all predisease activities without restriction)  1 - Symptomatic but completely ambulatory (Restricted in physically strenuous activity but ambulatory and able to carry out work of a light or sedentary nature. For example, light housework, office work)  2 - Symptomatic, <50% in bed during the day (Ambulatory and capable of all self care but unable to carry out any work activities. Up and about more than 50% of waking hours)  3 - Symptomatic, >50% in bed, but not bedbound (Capable of only limited self-care, confined to bed or chair 50% or more of waking hours)  4 - Bedbound (Completely disabled. Cannot carry on any self-care. Totally confined to bed or  chair)  5 - Death   Eustace Pen MM, Creech RH, Tormey DC, et al.  [phone]). "Toxicity and response criteria of the [facility]". [place] Oncol. 5 (6): 649-55    LABORATORY DATA:  Lab Results  Component Value Date   WBC 4.8 06/07/2020   HGB 11.4 (L) 06/07/2020   HCT 32.6 (L) 06/07/2020   MCV 86.2 06/07/2020   PLT 148 (L) 06/07/2020   Lab Results  Component Value Date   NA 138 06/07/2020   K 4.2 06/07/2020   CL 101 06/07/2020   CO2 25 06/07/2020   Lab Results  Component Value Date   ALT 40 06/07/2020   AST 17 06/07/2020   ALKPHOS 76 06/07/2020   BILITOT 0.3 06/07/2020      RADIOGRAPHY: DG Chest 2 View  Result Date: 06/03/2020 CLINICAL DATA:  Cough. EXAM: CHEST - 2 VIEW COMPARISON:  03/25/2017 FINDINGS: Cardiac silhouette is normal in size. No mediastinal or hilar masses or evidence of adenopathy. Linear opacities project in the anterior lung base on the lateral view consistent with scarring. Lungs otherwise clear. No pleural effusion or pneumothorax. Right anterior chest wall Port-A-Cath is stable. Skeletal structures are intact. IMPRESSION: No active cardiopulmonary disease. Electronically Signed   By: Lajean Manes M.D.   On: 06/03/2020 20:04   MR BRAIN W WO CONTRAST  Result Date: 05/30/2020 CLINICAL DATA:  Brain mass or lesion. Status post craniotomy for brain mass. EXAM: MRI HEAD WITHOUT AND WITH CONTRAST TECHNIQUE: Multiplanar, multiecho pulse sequences of the brain and surrounding structures were obtained without and with intravenous contrast. CONTRAST:  47mL GADAVIST GADOBUTROL 1 MMOL/ML IV SOLN COMPARISON:  MRI May 27, 2020. FINDINGS: Brain: Interval resection of the previously described left cerebellar mass. Evaluation is limited by the iron throughout the left cerebellum from prior Feraheme injection. Post-contrast is imaging is particularly limited within these limitations, no specific evidence of residual tumor. T2 hypointense area in the anterior aspect of the resection cavity is favored to represent an  air bubble. There is expected blood products within the resection cavity and expected devitalized tissue. Redemonstrated left cerebellar edema with T1 hyperintensity from Feraheme staining. Decreased mass effect and decreased effacement of the fourth ventricle. The ventricular system remains mildly dilated, but this is slightly improved from the prior. Mild periventricular edema. No midline shift. Basal cisterns are patent. Vascular: Major arterial flow voids are maintained at the skull base. Skull and upper cervical spine: Left occipital craniectomy. Heterogeneous marrow signal without evidence of focal marrow replacing lesion. Sinuses/Orbits: Mild paranasal sinus mucosal thickening without air-fluid levels. Unremarkable orbits. Other: Trace bilateral mastoid effusions. IMPRESSION: 1. 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 Evaluation is limited by iron throughout the left cerebellum from prior Feraheme injection. Post-contrast is imaging is particularly limited. Within these limitations, no specific evidence of residual tumor. T2 hypointense area in the anterior aspect of the resection cavity is favored to represent an air bubble, but warrants attention on follow. 2. Redemonstrated left cerebellar edema with decreased mass effect and decreased effacement of the fourth ventricle. The ventricular system remains dilated, but this is slightly improved from the prior. Similar mild periventricular edema. Electronically Signed   By: Margaretha Sheffield MD   On: 05/30/2020 10:38       IMPRESSION/PLAN: 1. Recurrent metastatic stage III colon cancer now with solitary brain metastasis.  Dr. Glendora Score discusses the findings from the patient's pretreatment imaging when she came into the hospital setting, as well as her postoperative imaging  and our discussion was reviewed with her from a brain oncology conference.  Both Dr. Saintclair Halsted and Dr. Mickeal Skinner are aware of our plans and recommendations for postoperative stereotactic  radiosurgery [facility]  Dr. Loletha Grayer discusses the rationale to reduce risks of local recurrence in the cavity and would offer 3 fractions of postoperative SRS treatment.  We discussed the limitations of her prior iron infusion when it comes to artifact on MRI imaging so Dr. Lisbeth Renshaw has recommended that we proceed with CT simulation with thin cut images, with fusion of her presurgical MRI scan.  If additional imaging is required based on that, we could proceed appropriately if needed.  We reviewed the risks, benefits, short and long-term effects of radiotherapy.  She is interested in proceeding and will be contacted by our navigator to so that we can coordinate simulation within the next few weeks.  She will sign written consent at the time of coming for simulation.  We will also notify Dr. Bobby Rumpf that he is actively engaged in her management as this is an unexpected and new finding. 2. Claustrophobia.  The patient admits to claustrophobia with prior MRIs for being in smaller tight confining spaces.  We discussed the utilization of benzodiazepines prior to imaging, planning of treatment, and subsequent administration.  She is also aware that this would be helpful prior to her surveillance images as well.  She will be introduced to Dr. Mickeal Skinner who will be planning to follow her in that setting.  We reviewed the side effect profile of the medication and she is aware that someone would need to drive her when taking Ativan.  This will be sent into her pharmacy so she can use this prior visit to the treatments.  Given current concerns for patient exposure during the COVID-19 pandemic, this encounter was conducted via telephone.  The patient has provided two factor identification and has given verbal consent for this type of encounter and has been advised to only accept a meeting of this type in a secure network environment. The time spent during this encounter was 60 minutes including preparation, discussion, and coordination  of the patient's care. The attendants for this meeting include Blenda Nicely, RN, Dr. Lisbeth Renshaw, Hayden Pedro  and [name] and her mother Amedeo Kinsman.  During the encounter,  Blenda Nicely, RN, Dr. Lisbeth Renshaw, and Hayden Pedro were located at [facility] Radiation Oncology Department.  Adhya Dlugosz was located at [facility] in the [place] on 4th floor. Her mother Amedeo Kinsman also joined on the call.   The above documentation reflects my direct findings during this shared patient visit. Please see the separate note by Dr. Lisbeth Renshaw on this date for the remainder of the patient's plan of care.    Carola Rhine, PAC

## 2020-06-12 NOTE — Progress Notes (Signed)
[place] PHYSICAL MEDICINE & REHABILITATION PROGRESS NOTE   Subjective/Complaints: Has headache this morning. Generally better  ROS: Patient denies fever, rash, sore throat, blurred vision, nausea, vomiting, diarrhea, cough, shortness of breath or chest pain, joint or back pain, headache, or mood change.   Objective:   No results found. No results for input(s): WBC, HGB, HCT, PLT in the last 72 hours. No results for input(s): NA, K, CL, CO2, GLUCOSE, BUN, CREATININE, CALCIUM in the last 72 hours.  Intake/Output Summary (Last 24 hours) at 06/12/2020 0841 Last data filed at 06/11/2020 1336 Gross per 24 hour  Intake 240 ml  Output --  Net 240 ml        Physical Exam: Vital Signs Blood pressure 105/70, pulse 83, temperature 98.2 F (36.8 C), resp. rate 20, height 5\' 8"  (1.727 m), last menstrual period 02/22/2019, SpO2 97 %. Constitutional: No distress . Vital signs reviewed. HEENT: EOMI, oral membranes moist Neck: supple Cardiovascular: RRR without murmur. No JVD    Respiratory/Chest: CTA Bilaterally without wheezes or rales. Normal effort    GI/Abdomen: BS +, non-tender, non-distended Ext: no clubbing, cyanosis, or edema Psych: pleasant and cooperative Skin: cervical incision CDI with sutures now removed Neuro: Alert and oriented x 3. Normal insight and awareness. Intact Memory. Normal language and speech. Cranial nerve exam unremarkable  Sensory exam is normal. Reflexes are 2+ in all 4's. Fine motor coordination is intac generally for finger to nose on right. Mild dysmetria, past pointing LUE.  No tremors. Motor function is grossly 5/5.  Musculoskeletal: Full ROM, No pain with AROM or PROM in the neck, trunk, or extremities. Posture appropriate   Assessment/Plan: 1. Functional deficits which require 3+ hours per day of interdisciplinary therapy in a comprehensive inpatient rehab setting.  Physiatrist is providing close team supervision and 24 hour management of active  medical problems listed below.  Physiatrist and rehab team continue to assess barriers to discharge/monitor patient progress toward functional and medical goals  Care Tool:  Bathing    Body parts bathed by patient: Right arm,Left arm,Chest,Abdomen,Front perineal area,Buttocks,Right upper leg,Left upper leg,Right lower leg,Left lower leg,Face         Bathing assist Assist Level: Supervision/Verbal cueing (distant S, therapist on other side of curtain)     Upper Body Dressing/Undressing Upper body dressing   What is the patient wearing?: Pull over shirt,Bra    Upper body assist Assist Level: Supervision/Verbal cueing    Lower Body Dressing/Undressing Lower body dressing      What is the patient wearing?: Underwear/pull up,Pants     Lower body assist Assist for lower body dressing: Supervision/Verbal cueing     Toileting Toileting    Toileting assist Assist for toileting: Supervision/Verbal cueing Assistive Device Comment: walker   Transfers Chair/bed transfer  Transfers assist     Chair/bed transfer assist level: Supervision/Verbal cueing Chair/bed transfer assistive device: Programmer, multimedia   Ambulation assist      Assist level: Contact Guard/Touching assist Assistive device: No Device Max distance: 20'   Walk 10 feet activity   Assist     Assist level: Contact Guard/Touching assist Assistive device: No Device   Walk 50 feet activity   Assist    Assist level: Contact Guard/Touching assist Assistive device: No Device    Walk 150 feet activity   Assist Walk 150 feet activity did not occur: Safety/medical concerns  Assist level: Contact Guard/Touching assist Assistive device: No Device    Walk 10 feet on uneven surface  activity   Assist     Assist level: Minimal Assistance - Patient > 75%     Wheelchair     Assist Will patient use wheelchair at discharge?: No   Wheelchair activity did not occur: N/A          Wheelchair 50 feet with 2 turns activity    Assist    Wheelchair 50 feet with 2 turns activity did not occur: N/A       Wheelchair 150 feet activity     Assist  Wheelchair 150 feet activity did not occur: N/A       Blood pressure 105/70, pulse 83, temperature 98.2 F (36.8 C), resp. rate 20, height 5\' 8"  (1.727 m), last menstrual period 02/22/2019, SpO2 97 %.  Medical Problem List and Plan: 1.  Dizziness with blurred vision and decreased functional mobility secondary to left cerebellar intra-axial lesion, likely metastatic with history of colon cancer 2018.  Status post suboccipital craniectomy resection of cerebellar mass 05/29/2020.  Decadron tapered             -patient may  Shower, but try to cover suboccipital crani site             -ELOS/Goals: 1/15   -MRI/XRT delayed to allow time for effects of iron infusion to clear 2.  Antithrombotics: -DVT/anticoagulation: SCDs             -antiplatelet therapy: N/A 3. Pain Management: Oxycodone as needed-  -can use oral dilaudid for severe h/a  - topamax, increased to 50mg  BID on 1/10 with goal still of removing dilaudid   -headaches improving, decrease dilaudid to night time only 4. Mood: Luvox 300 mg nightly, Lamictal 150 mg daily             -antipsychotic agents: Seroquel 800 mg nightly  -pt sees Dr. Clovis Pu locally. Is interested in seeing neuropsychology as outpt. Admits to depression at times, definitely has her "moments" 5. Neuropsych: This patient is capable of making decisions on her own behalf. 6. Skin/Wound Care: Routine skin checks.   -sutures out 7. Fluids/Electrolytes/Nutrition: Routine in and outs   -intake adequate at present.   8.  Diabetes mellitus.  Hemoglobin A1c 9.6.  Glucophage 500 mg twice daily, NovoLog 6 units 3 times daily, Lantus insulin 20 units daily.  Check blood sugars before meals and at bedtime  -cbg's have been poorly controlled in 200'-300's d/t decadron  -increased lantus to 26u  daily on 1/10, continue same novolog    -some improvement last 24 hours  -Tapered dexamethasone to 2mg  daily--stay with this dose  9.  Hypothyroidism.  Synthroid 10.  Hyperlipidemia.  Lipitor 11.  GERD.  Protonix 12.  Obesity.  BMI 31.32.  Dietary follow-up 13.  OCD.  Continue Luvox 15. Orthostatic hypotension? BP ok so far.    -encouraged adequate PO  -THT's, binder if needed  1/12 bp's more robust 16. Constipation- added miralax prn   -sorbitol 1/6 with large bm   -needs another bm---last on 1/8 17. Treatment of L cerebellar mass - w/u on hold as above  LOS: 6 days A FACE TO FACE EVALUATION WAS PERFORMED  Meredith Staggers 06/12/2020, 8:41 AM

## 2020-06-12 NOTE — Progress Notes (Addendum)
Patient ID: Shannon [name], female   DOB: [birth year], 55 y.o.   MRN: [id number]  SW scheduled neuropsych appointment with [facility] for Dr. Sima Matas [phone]); appt on April 2 at [place] will need to fax order to clinic, SW requested physician put in referral.   Loralee Pacas, MSW, [place] Office: [phone] Cell: [phone] Fax: [phone]

## 2020-06-12 NOTE — Progress Notes (Signed)
Inpatient Diabetes Program Recommendations  AACE/ADA: New Consensus Statement on Inpatient Glycemic Control (2015)  Target Ranges:  Prepandial:   less than 140 mg/dL      Peak postprandial:   less than 180 mg/dL (1-2 hours)      Critically ill patients:  140 - 180 mg/dL   Lab Results  Component Value Date   GLUCAP 151 (H) 06/12/2020   HGBA1C 9.7 (H) 06/02/2020    Review of Glycemic Control  Inpatient Diabetes Program Recommendations:    Prior to Discharge: [id number] for Libre continuous glucose monitor to be placed prior to D/C home  Spoke with patient @ bedside regarding nutrition plate method and answered questions regarding meal plans and portion management.  Reviewed Abbott continuous glucose monitor and sensor with patient and glad to assist patient with sensor placement prior to discharge home. Patient very interested and acknowledged understanding.  Thank you, Nani Gasser. Hanley Rispoli, RN, MSN, CDE  Diabetes Coordinator Inpatient Glycemic Control Team Team Pager [phone] (8am-5pm) 06/12/2020 2:42 PM

## 2020-06-13 ENCOUNTER — Ambulatory Visit: Payer: PRIVATE HEALTH INSURANCE | Admitting: Radiation Oncology

## 2020-06-13 ENCOUNTER — Inpatient Hospital Stay (HOSPITAL_COMMUNITY): Payer: PRIVATE HEALTH INSURANCE | Admitting: Occupational Therapy

## 2020-06-13 ENCOUNTER — Ambulatory Visit: Payer: PRIVATE HEALTH INSURANCE

## 2020-06-13 ENCOUNTER — Inpatient Hospital Stay (HOSPITAL_COMMUNITY): Payer: PRIVATE HEALTH INSURANCE | Admitting: Speech Pathology

## 2020-06-13 ENCOUNTER — Inpatient Hospital Stay (HOSPITAL_COMMUNITY): Payer: PRIVATE HEALTH INSURANCE

## 2020-06-13 ENCOUNTER — Inpatient Hospital Stay: Payer: PRIVATE HEALTH INSURANCE | Admitting: Internal Medicine

## 2020-06-13 ENCOUNTER — Ambulatory Visit: Payer: PRIVATE HEALTH INSURANCE | Admitting: Internal Medicine

## 2020-06-13 ENCOUNTER — Inpatient Hospital Stay (HOSPITAL_COMMUNITY): Payer: PRIVATE HEALTH INSURANCE | Admitting: *Deleted

## 2020-06-13 LAB — GLUCOSE, CAPILLARY
Glucose-Capillary: 142 mg/dL — ABNORMAL HIGH (ref 70–99)
Glucose-Capillary: 184 mg/dL — ABNORMAL HIGH (ref 70–99)
Glucose-Capillary: 320 mg/dL — ABNORMAL HIGH (ref 70–99)
Glucose-Capillary: 283 mg/dL — ABNORMAL HIGH (ref 70–99)

## 2020-06-13 MED ORDER — DEXAMETHASONE 2 MG PO TABS: 2.0000 mg | ORAL_TABLET | Freq: Every day | ORAL | 0 refills | Status: DC

## 2020-06-13 MED ORDER — POLYETHYLENE GLYCOL 3350 17 G PO PACK: 17.0000 g | PACK | Freq: Every day | ORAL | 0 refills | Status: DC

## 2020-06-13 MED ORDER — LAMOTRIGINE 150 MG PO TABS: 150.0000 mg | ORAL_TABLET | Freq: Every day | ORAL | 3 refills | Status: DC

## 2020-06-13 MED ORDER — QUETIAPINE FUMARATE 400 MG PO TABS: 800.0000 mg | ORAL_TABLET | Freq: Every day | ORAL | 0 refills | Status: DC

## 2020-06-13 MED ORDER — PANTOPRAZOLE SODIUM 40 MG PO TBEC: 40.0000 mg | DELAYED_RELEASE_TABLET | Freq: Every day | ORAL | 0 refills | Status: DC

## 2020-06-13 MED ORDER — LEVOTHYROXINE SODIUM 75 MCG PO TABS
75.0000 ug | ORAL_TABLET | Freq: Every day | ORAL | 0 refills | Status: DC
Start: 2020-06-13 — End: 2024-04-17

## 2020-06-13 MED ORDER — FLUVOXAMINE MALEATE 100 MG PO TABS: 300.0000 mg | ORAL_TABLET | Freq: Every day | ORAL | 1 refills | Status: DC

## 2020-06-13 MED ORDER — ACETAMINOPHEN 325 MG PO TABS: 650.0000 mg | ORAL_TABLET | Freq: Four times a day (QID) | ORAL | Status: AC | PRN

## 2020-06-13 MED ORDER — HYOSCYAMINE SULFATE 0.125 MG SL SUBL: 0.1250 mg | SUBLINGUAL_TABLET | Freq: Four times a day (QID) | SUBLINGUAL | 1 refills | Status: AC | PRN

## 2020-06-13 MED ORDER — ATORVASTATIN CALCIUM 10 MG PO TABS: 10.0000 mg | ORAL_TABLET | Freq: Every day | ORAL | 0 refills | Status: DC

## 2020-06-13 MED ORDER — DEXAMETHASONE 2 MG PO TABS
2.0000 mg | ORAL_TABLET | Freq: Every day | ORAL | 0 refills | Status: DC
Start: 2020-06-13 — End: 2020-06-13

## 2020-06-13 MED ORDER — SENNOSIDES-DOCUSATE SODIUM 8.6-50 MG PO TABS: 2.0000 | ORAL_TABLET | Freq: Every day | ORAL | Status: AC

## 2020-06-13 MED ORDER — INSULIN ASPART 100 UNIT/ML ~~LOC~~ SOLN
8.0000 [IU] | Freq: Three times a day (TID) | SUBCUTANEOUS | Status: DC
  Administered 2020-06-13 – 2020-06-15 (×6): 8 [IU] via SUBCUTANEOUS

## 2020-06-13 NOTE — Progress Notes (Signed)
Occupational Therapy Session Note  Patient Details  Name: Shannon [name] MRN: [id number] Date of Birth: 07-18-1965  Today's Date: 06/13/2020 OT Individual Time: 3818-2993 OT Individual Time Calculation (min): 47 min    Short Term Goals: Week 1:  OT Short Term Goal 1 (Week 1): STG = LTG d/t ELOS  Skilled Therapeutic Interventions/Progress Updates:    Pt received in bed and decided to not try to go to gift shop this session.  She instead requested to shower.   Pt was able to walk around her room and into the bathroom with distant supervision and then completed all self care at a mod I and I level.  She completed grooming standing at the sink, shower, dressing. She has really improved in her endurance and has not had any dizziness this session.  Pt resting in recliner with all needs met.    Therapy Documentation Precautions:  Precautions Precautions: Fall Precaution Comments: orthostatic hypotension Restrictions Weight Bearing Restrictions: No   Pain: Pain Assessment Pain Score: 0-No pain ADL: ADL Eating: Independent Grooming: Independent Where Assessed-Grooming: Standing at sink Upper Body Bathing: Independent Where Assessed-Upper Body Bathing: Shower Lower Body Bathing: Modified independent Where Assessed-Lower Body Bathing: Shower Upper Body Dressing: Independent Where Assessed-Upper Body Dressing: Standing at sink Lower Body Dressing: Independent Where Assessed-Lower Body Dressing: Standing at sink Toileting: Independent Where Assessed-Toileting: Glass blower/designer: Distant supervision Armed forces technical officer Method: Magazine features editor: Distant supervision Social research officer, government Method: Heritage manager: Civil engineer, contracting with back,Grab bars   Therapy/Group: Individual Therapy  [place] 06/13/2020, 12:33 PM

## 2020-06-13 NOTE — Progress Notes (Signed)
Speech Language Pathology Daily Session Note  Patient Details  Name: Valmai Vandenberghe MRN: [id number] Date of Birth: Jun 13, 1965  Today's Date: 06/13/2020 SLP Individual Time: 0730-0825 SLP Individual Time Calculation (min): 55 min  Short Term Goals: Week 1: SLP Short Term Goal 1 (Week 1): STGs=LTGs due to ELOS  Skilled Therapeutic Interventions: Skilled treatment session focused on cognitive goals. Upon arrival, patient was wake in bed. Patient transferred to the recliner with supervision and independently donned her TED hoes. SLP facilitated session by providing education regarding memory compensatory strategies and how to incorporate strategies at home. Patient verbalized understanding. SLP also provided extra time and overall Min verbal cues for reasoning and problem solving during complex deductive reasoning tasks. Patient left upright in the recliner with alarm on and all needs within reach. Continue with current plan of care.      Pain Pain Assessment Pain Score: 0-No pain  Therapy/Group: Individual Therapy  Mihaela Fajardo 06/13/2020, 3:23 PM

## 2020-06-13 NOTE — Progress Notes (Signed)
[facility] PHYSICAL MEDICINE & REHABILITATION PROGRESS NOTE   Subjective/Complaints: Had headache last night. Used dilaudid. Not using much of the oxycodone. No dizziness yesterday.   ROS: Patient denies fever, rash, sore throat, blurred vision, nausea, vomiting, diarrhea, cough, shortness of breath or chest pain, joint or back pain,   or mood change.    Objective:   No results found. No results for input(s): WBC, HGB, HCT, PLT in the last 72 hours. No results for input(s): NA, K, CL, CO2, GLUCOSE, BUN, CREATININE, CALCIUM in the last 72 hours.  Intake/Output Summary (Last 24 hours) at 06/13/2020 1009 Last data filed at 06/13/2020 0700 Gross per 24 hour  Intake 440 ml  Output --  Net 440 ml        Physical Exam: Vital Signs Blood pressure 92/63, pulse 100, temperature 97.9 F (36.6 C), temperature source Oral, resp. rate 16, height 5\' 8"  (1.727 m), last menstrual period 02/22/2019, SpO2 97 %. Constitutional: No distress . Vital signs reviewed. HEENT: EOMI, oral membranes moist Neck: supple Cardiovascular: RRR without murmur. No JVD    Respiratory/Chest: CTA Bilaterally without wheezes or rales. Normal effort    GI/Abdomen: BS +, non-tender, non-distended Ext: no clubbing, cyanosis, or edema Psych: pleasant and cooperative Skin: cervical incision CDI   Neuro: Alert and oriented x 3. Normal insight and awareness. Intact Memory. Normal language and speech. Cranial nerve exam unremarkable.  Sensory exam is normal. Reflexes are 2+ in all 4's. Fine motor coordination is intac generally for finger to nose on right. Mild dysmetria, past pointing LUE.  No tremors. Motor function is grossly 5/5.  Musculoskeletal: Full ROM, No pain with AROM or PROM in the neck, trunk, or extremities. Posture appropriate   Assessment/Plan: 1. Functional deficits which require 3+ hours per day of interdisciplinary therapy in a comprehensive inpatient rehab setting.  Physiatrist is providing close team  supervision and 24 hour management of active medical problems listed below.  Physiatrist and rehab team continue to assess barriers to discharge/monitor patient progress toward functional and medical goals  Care Tool:  Bathing    Body parts bathed by patient: Right arm,Left arm,Chest,Abdomen,Front perineal area,Buttocks,Right upper leg,Left upper leg,Right lower leg,Left lower leg,Face         Bathing assist Assist Level: Supervision/Verbal cueing (distant S, therapist on other side of curtain)     Upper Body Dressing/Undressing Upper body dressing   What is the patient wearing?: Pull over shirt,Bra    Upper body assist Assist Level: Supervision/Verbal cueing    Lower Body Dressing/Undressing Lower body dressing      What is the patient wearing?: Underwear/pull up,Pants     Lower body assist Assist for lower body dressing: Supervision/Verbal cueing     Toileting Toileting    Toileting assist Assist for toileting: Supervision/Verbal cueing Assistive Device Comment: walker   Transfers Chair/bed transfer  Transfers assist     Chair/bed transfer assist level: Supervision/Verbal cueing Chair/bed transfer assistive device: Programmer, multimedia   Ambulation assist      Assist level: Contact Guard/Touching assist Assistive device: No Device Max distance: 20'   Walk 10 feet activity   Assist     Assist level: Contact Guard/Touching assist Assistive device: No Device   Walk 50 feet activity   Assist    Assist level: Contact Guard/Touching assist Assistive device: No Device    Walk 150 feet activity   Assist Walk 150 feet activity did not occur: Safety/medical concerns  Assist level: Contact Guard/Touching assist  Assistive device: No Device    Walk 10 feet on uneven surface  activity   Assist     Assist level: Minimal Assistance - Patient > 75%     Wheelchair     Assist Will patient use wheelchair at discharge?: No    Wheelchair activity did not occur: N/A         Wheelchair 50 feet with 2 turns activity    Assist    Wheelchair 50 feet with 2 turns activity did not occur: N/A       Wheelchair 150 feet activity     Assist  Wheelchair 150 feet activity did not occur: N/A       Blood pressure 92/63, pulse 100, temperature 97.9 F (36.6 C), temperature source Oral, resp. rate 16, height 5\' 8"  (1.727 m), last menstrual period 02/22/2019, SpO2 97 %.  Medical Problem List and Plan: 1.  Dizziness with blurred vision and decreased functional mobility secondary to left cerebellar intra-axial lesion path + for metastatic adenocarcinoma of colon.  Status post suboccipital craniectomy resection of cerebellar mass 05/29/2020.  Decadron tapered             -patient may shower             -ELOS/Goals: 1/15   -MRI/XRT delayed to allow time for effects of iron infusion to clear 2.  Antithrombotics: -DVT/anticoagulation: SCDs             -antiplatelet therapy: N/A 3. Pain Management: Oxycodone as needed-  -can use oral dilaudid for severe h/a  - topamax, increased to 50mg  BID on 1/10 with goal still of removing dilaudid   -headaches improving, decrease dilaudid to night time only  -may send home with a few dilaudid  -may use oxycodone 5mg -10mg  as well  -assured her that headaches should improve over the next days and weeks ahead 4. Mood: Luvox 300 mg nightly, Lamictal 150 mg daily             -antipsychotic agents: Seroquel 800 mg nightly  -pt sees Dr. Clovis Pu locally. Is interested in seeing neuropsychology as outpt. Admits to depression at times, definitely has her "moments" 5. Neuropsych: This patient is capable of making decisions on her own behalf. 6. Skin/Wound Care: Routine skin checks.   -sutures out 7. Fluids/Electrolytes/Nutrition: Routine in and outs   -intake adequate at present.   8.  Diabetes mellitus.  Hemoglobin A1c 9.6.  Glucophage 500 mg twice daily, NovoLog 6 units 3 times  daily, Lantus insulin 20 units daily.  Check blood sugars before meals and at bedtime  -cbg's have been poorly controlled in 200'-300's d/t decadron  -increased lantus to 26u daily on 1/10, continue same novolog    -some improvement last 24 hours  -Tapered dexamethasone to 2mg  daily--stay with this dose  1/13 increase novolog to 8u with meals 9.  Hypothyroidism.  Synthroid 10.  Hyperlipidemia.  Lipitor 11.  GERD.  Protonix 12.  Obesity.  BMI 31.32.  Dietary follow-up 13.  OCD.  Continue Luvox 15. Orthostatic hypotension? BP ok so far.    -encouraged adequate PO  -THT's, binder if needed  1/12-13 bp's more robust 16. Constipation- added miralax prn   -sorbitol 1/6 with large bm   -had large bm 1/12   LOS: 7 days A FACE TO FACE EVALUATION WAS PERFORMED  Meredith Staggers 06/13/2020, 10:09 AM

## 2020-06-13 NOTE — Discharge Summary (Signed)
Physician Discharge Summary  Patient ID: Shannon [name] MRN: [id number] DOB/AGE: 1965/11/12 55 y.o.  Admit date: 06/06/2020 Discharge date: 06/15/2020  Discharge Diagnoses:  Principal Problem:   Cerebellar mass Mood stabilization Diabetes mellitus Hypothyroidism Hyperlipidemia GERD Obesity OCD Constipation  Discharged Condition: Stable  Significant Diagnostic Studies: DG Chest 2 View  Result Date: 06/03/2020 CLINICAL DATA:  Cough. EXAM: CHEST - 2 VIEW COMPARISON:  03/25/2017 FINDINGS: Cardiac silhouette is normal in size. No mediastinal or hilar masses or evidence of adenopathy. Linear opacities project in the anterior lung base on the lateral view consistent with scarring. Lungs otherwise clear. No pleural effusion or pneumothorax. Right anterior chest wall Port-A-Cath is stable. Skeletal structures are intact. IMPRESSION: No active cardiopulmonary disease. Electronically Signed   By: Lajean Manes M.D.   On: 06/03/2020 20:04   MR BRAIN W WO CONTRAST  Result Date: 05/30/2020 CLINICAL DATA:  Brain mass or lesion. Status post craniotomy for brain mass. EXAM: MRI HEAD WITHOUT AND WITH CONTRAST TECHNIQUE: Multiplanar, multiecho pulse sequences of the brain and surrounding structures were obtained without and with intravenous contrast. CONTRAST:  104mL GADAVIST GADOBUTROL 1 MMOL/ML IV SOLN COMPARISON:  MRI May 27, 2020. FINDINGS: Brain: Interval resection of the previously described left cerebellar mass. Evaluation is limited by the iron throughout the left cerebellum from prior Feraheme injection. Post-contrast is imaging is particularly limited within these limitations, no specific evidence of residual tumor. T2 hypointense area in the anterior aspect of the resection cavity is favored to represent an air bubble. There is expected blood products within the resection cavity and expected devitalized tissue. Redemonstrated left cerebellar edema with T1 hyperintensity from Feraheme  staining. Decreased mass effect and decreased effacement of the fourth ventricle. The ventricular system remains mildly dilated, but this is slightly improved from the prior. Mild periventricular edema. No midline shift. Basal cisterns are patent. Vascular: Major arterial flow voids are maintained at the skull base. Skull and upper cervical spine: Left occipital craniectomy. Heterogeneous marrow signal without evidence of focal marrow replacing lesion. Sinuses/Orbits: Mild paranasal sinus mucosal thickening without air-fluid levels. Unremarkable orbits. Other: Trace bilateral mastoid effusions. IMPRESSION: 1. 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 Evaluation is limited by iron throughout the left cerebellum from prior Feraheme injection. Post-contrast is imaging is particularly limited. Within these limitations, no specific evidence of residual tumor. T2 hypointense area in the anterior aspect of the resection cavity is favored to represent an air bubble, but warrants attention on follow. 2. Redemonstrated left cerebellar edema with decreased mass effect and decreased effacement of the fourth ventricle. The ventricular system remains dilated, but this is slightly improved from the prior. Similar mild periventricular edema. Electronically Signed   By: Margaretha Sheffield MD   On: 05/30/2020 10:38    Labs:  Basic Metabolic Panel: Recent Labs  Lab 06/07/20 0525  NA 138  K 4.2  CL 101  CO2 25  GLUCOSE 234*  BUN 18  CREATININE 0.97  CALCIUM 8.7*    CBC: Recent Labs  Lab 06/07/20 0525  WBC 4.8  NEUTROABS 3.4  HGB 11.4*  HCT 32.6*  MCV 86.2  PLT 148*    CBG: Recent Labs  Lab 06/12/20 2107 06/13/20 0615 06/13/20 1129 06/13/20 1629 06/13/20 2111  GLUCAP 228* 142* 184* 320* 74*   Family history.  Mother with irritable bowel syndrome.  Father with colon polyps.  Maternal grandmother with breast cancer as well as diabetes.  Maternal uncle with colon polyps.  Negative stomach cancer  pancreatic  cancer esophageal cancer or colon cancer  Brief HPI:   Shannon [name] is a 55 y.o. right-handed female with history of anxiety depression as well as OCD maintained on Luvox, diabetes mellitus obesity with BMI 31.32 colon cancer 2018 with resection followed by chemotherapy.  Per chart review lives with 71 year old daughter independent prior to admission working from home.  Her mother has been assisting with care.  Presented 05/29/2020 with 1 month history of headache nausea vomiting and dizziness with diplopia as well as multiple falls.  Work-up and x-rays imaging showed a large left cerebellar mass.  Underwent suboccipital craniectomy resection of left cerebellar mass 05/29/2020 per Dr. Saintclair Halsted.  Maintained on Decadron protocol.  Tolerating a regular diet.  Physical medicine rehab consult requested to assess candidacy for CIR given impairment in cognition and mobility and patient was admitted for a comprehensive rehab program.   Hospital Course: Mayrene Bastarache was admitted to rehab 06/06/2020 for inpatient therapies to consist of PT, ST and OT at least three hours five days a week. Past admission physiatrist, therapy team and rehab RN have worked together to provide customized collaborative inpatient rehab.  Pertaining to patient's left cerebellar intra-axial lesion likely metastatic with history of colon cancer 2018.  Status post suboccipital craniectomy resection of cerebellar mass 05/29/2020.  Surgical site healing nicely patient would follow-up neurosurgery.  Decadron as directed.  Patient would follow-up oncology services as well as Dr. Moody/Dr. Mickeal Skinner radiation oncology for staging pain management with the use of Dilaudid that was slowly tapered as well as oxycodone for pain and Topamax for headaches.  Mood stabilization with Luvox Lamictal as well as Seroquel patient sees Dr.Cottle locally and would follow-up neuropsychology.  Blood sugars monitored hemoglobin A1c 9.6 with  insulin therapy as directed as well as Glucophage and would need ongoing adjustments as Decadron was ongoing.  Hypothyroidism with hormone supplement ongoing.  Lipitor for hyperlipidemia.  Noted obesity BMI 31.32 dietary follow-up.  She did have some transient orthostatic blood pressure abdominal binder if needed and monitored closely.   Blood pressures were monitored on TID basis and soft and monitored  Diabetes has been monitored with ac/hs CBG checks and SSI was use prn for tighter BS control.    Rehab course: During patient's stay in rehab weekly team conferences were held to monitor patient's progress, set goals and discuss barriers to discharge. At admission, patient required supervision sit to stand supervision supine to sit minimal assist 250 feet rolling walker.  Minimal guard toilet transfers minimal guard lower body dressing  Physical exam.  Blood pressure 115/82 pulse 85 temperature 98.1 respirations 18 oxygen saturation 94% room air Constitutional.  No acute distress HEENT.  Craniotomy site clean and dry Eyes.  Pupils round and reactive to light no discharge without nystagmus Neck.  Supple nontender no JVD without thyromegaly Cardiac regular rate rhythm without extra sounds or murmur heard Abdomen.  Soft nontender positive bowel sounds without rebound Respiratory effort normal no respiratory distress without wheeze Musculoskeletal. Comments.  Upper extremities 5/5 bilateral and biceps triceps, wrist extension, grip and finger abduction bilaterally Lower extremities hip flexion 5 -/5 bilaterally otherwise 5/5 knee extension knee flexion dorsi plantarflexion Neurologic alert speech slow but deliberate provides her name and age follows simple commands  He/She  has had improvement in activity tolerance, balance, postural control as well as ability to compensate for deficits. He/She has had improvement in functional use RUE/LUE  and RLE/LLE as well as improvement in awareness.   Ambulates 90  feet without assistive device contact-guard assist.  Sidesteps with cone taps no assistive device minimal assist for balance.  She does exhibit some decrease in balance it was discussed with family need for her assistance for safety.  Modified independent upper body bathing supervision lower body bathing supervision upper body dressing supervision lower body dressing.  Speech therapy follow-up for cognition modified independent for organizing a daily pillbox.  She was able to communicate her needs.  Again it was discussed with family for supervision for safety.  Full family teaching completed plan discharge to home       Disposition: Discharge to home    Diet: Diabetic diet  Special Instructions: No driving smoking or alcohol  Patient was to follow-up oncology service as well as radiation oncology as directed  Medications at discharge 1.  Tylenol as needed 2.  Lipitor 10 mg nightly 3.  Decadron 2 mg daily 4.  Luvox 300 mg nightly 5.  NovoLog 6 units 3 times daily with meals 6.  Lantus insulin 26 units daily 7.  Lamictal 150 mg nightly 8.  Synthroid 75 mcg p.o. daily 9.  Glucophage 500 mg twice daily 10.  Oxycodone 5 to 10 mg every 4 hours as needed pain 11.  Protonix 40 mg nightly 12.  MiraLAX daily hold for loose stools 13.  Seroquel 800 mg nightly 14.  Senokot S2 tablets nightly 15.  Topamax 50 mg twice daily 16.Dilaudid 2 mg nightly as needed.  Dispense 7 tablets  30-35 minutes were spent completing discharge summary and discharge planning  Discharge Instructions    Ambulatory referral to Neuropsychology   Complete by: As directed    Moderate complexity follow-up neuropsychology left cerebellar intra-axial lesion   Ambulatory referral to Physical Medicine Rehab   Complete by: As directed    Moderate complexity follow-up 1 to 2 weeks left cerebellar mass       Follow-up Information    Raulkar, Clide Deutscher, MD Follow up.   Specialty: Physical Medicine and  Rehabilitation Why: Office to call for appointment Contact information: [id number] [place] [address] [phone]        Kary Kos, MD Follow up.   Specialty: Neurosurgery Why: Call for appointment Contact information: [address] [place] [address] [phone]        Marice Potter, MD Follow up.   Specialty: Oncology Why: Call for appointment Contact information: [address]. [place] [address] [id number]        Kyung Rudd, MD Follow up.   Specialty: Radiation Oncology Why: Call for appointment Contact information: [address] [place] [address] [id number]        Ventura Sellers, MD Follow up.   Specialties: Psychiatry, Neurology, Oncology Why: Call for appointment Contact information: [place] [address] [phone]               Signed: Lavon Paganini [place] 06/14/2020, 5:06 AM

## 2020-06-13 NOTE — Progress Notes (Signed)
Physical Therapy Session Note  Patient Details  Name: Shannon [name] MRN: [id number] Date of Birth: [birth year]  Today's Date: 06/13/2020 PT Individual Time: [id number] PT Treatment Time: 71 min  Short Term Goals: Week 1:  PT Short Term Goal 1 (Week 1): STG = LTG due to short LOS  Skilled Therapeutic Interventions/Progress Updates:     Pt received seated recliner and agrees to therapy. Reports 7/10 pain in neck. PT provides rest breaks as needed to manage pain symptoms. Sit to stand and stand step transfer to [facility] with supervision and cues on positioning. WC transport to gym for time management. Pt blood pressure taken as follows: Sitting 98/66, Standing 78/52, Following initial ambulation 99/57, end of session standing 103/74. Pt is slightly symptomatic upon initial stand but says symptoms pass and is not symptomatic for remainder of session. Pt ambulates ~400' with supervision and cues for increased trunk rotation and arm swing, and ambulating away from walls and objects on R side due to pt tendency to contact objects on R especially in crowded environment. Pt taken in [facility] downstairs to atrium for gait training with community reintegration focus. Pt follows multistep instructions to ambulate specific route in atrium and demos improved arm swing and gait stability. Pt then ambulates in gift shop for more crowded environment with increased external distractions. Pt demos improved balance and stability in gift shop, and attributes it to "having mind focused on other things".  Pt performs NMR with BITs system with visual scanning Single Target task. Pt takes average of 1.61 seconds with R hand and 1.75 seconds with L hand. Pt then performs same task while standing on foam wedge for increased challenge. 1.76 seconds with R and 1.56 seconds with L.  Pt performs TUG with average time 11.1 seconds. Pt left seated in recliner with alarm intact and all needs within reach.  Therapy  Documentation Precautions:  Precautions Precautions: Fall Precaution Comments: orthostatic hypotension Restrictions Weight Bearing Restrictions: No   Therapy/Group: Individual Therapy  Breck Coons, PT, DPT 06/13/2020, 4:11 PM

## 2020-06-13 NOTE — Progress Notes (Signed)
Patient ID: Shannon [name], female   DOB: 09-08-1965, 55 y.o.   MRN: [id number]  SW faxed outpatient PT/OT/SLP orders to [facility] (U:[phone]/G:[phone]). *Sw confirmed referral received.   SW met with pt in room to update on scheduled neuropsych appointment, and outpatient therapies. Reports of pt requiring a rollator. SW waiting on updates from therapy.   Loralee Pacas, MSW, [place] Office: [phone] Cell: [phone] Fax: [phone]

## 2020-06-13 NOTE — Evaluation (Signed)
Recreational Therapy Assessment and Plan  Patient Details  Name: Shannon [name] MRN: [id number] Date of Birth: 09-10-1965 Today's Date: 06/13/2020  Rehab Potential:  Good ELOS:   d/c 06/15/20 Pain:  No c/o Assessment   Hospital Problem: Principal Problem:   Cerebellar mass   Past Medical History:      Past Medical History:  Diagnosis Date  . Anemia    Iron De  . Anxiety   . Blood clot in vein   . Bowel obstruction ([place])   . Colon cancer ([place]) 2018   treated with surgery and radiation  . Depression   . Diabetes mellitus without complication ([place])    Type II  . DVT (deep venous thrombosis) ([place]) 2019   behind right knee- while she wasa on chemo  . GERD (gastroesophageal reflux disease)   . History of blood transfusion   . History of chemotherapy   . History of kidney stones 2012   passed  . Hypothyroidism   . Obesity    Past Surgical History:       Past Surgical History:  Procedure Laterality Date  . ABDOMINAL HYSTERECTOMY  05/2019   total laparoscopin with tubes and ovariers removed also.  . APPENDECTOMY  05/2019  . COLON SURGERY  03/2017   Colon resection  . COLONOSCOPY  2019  . SUBOCCIPITAL CRANIECTOMY CERVICAL LAMINECTOMY Left 05/29/2020   Procedure: Suboccipital Craniectomy for Resection of Left Cerebellar Mass;  Surgeon: Kary Kos, MD;  Location: [facility];  Service: Neurosurgery;  Laterality: Left;    Assessment & Plan Clinical Impression:    female presenting with a month of headaches nausea vomiting dizziness and ataxia. Work-up showing large left cerebellar mass. 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 PMH including anxiety, anemia, colon cancer (treated with sx and radiation), DM type 2, DVT, and hypothyroidism.  Pt presents with decreased activity tolerance, decreased functional mobility, decreased balance, decreased problem solving, decreased memory, decreased safety awareness  Limiting pt's independence with leisure/community pursuits.  Met with pt today to discuss leisure interests, leisure education, activity analysis/modifications, stress management/relaxation strategies, community resources.  Pt taking extensive notes and Provided pt with handouts on activity analysis and diaphragmatic breathing.  Pt appreciative of this visit.  Plan  No further TR  Recommendations for other services: None   Discharge Criteria: Patient will be discharged from TR if patient refuses treatment 3 consecutive times without medical reason.  If treatment goals not met, if there is a change in medical status, if patient makes no progress towards goals or if patient is discharged from hospital.  The above assessment, treatment plan, treatment alternatives and goals were discussed and mutually agreed upon: by patient  [place] 06/13/2020, 3:56 PM

## 2020-06-14 ENCOUNTER — Inpatient Hospital Stay (HOSPITAL_COMMUNITY): Payer: PRIVATE HEALTH INSURANCE | Admitting: Speech Pathology

## 2020-06-14 ENCOUNTER — Ambulatory Visit: Payer: PRIVATE HEALTH INSURANCE | Admitting: Internal Medicine

## 2020-06-14 ENCOUNTER — Ambulatory Visit: Payer: PRIVATE HEALTH INSURANCE | Admitting: Radiation Oncology

## 2020-06-14 ENCOUNTER — Inpatient Hospital Stay (HOSPITAL_COMMUNITY): Payer: PRIVATE HEALTH INSURANCE

## 2020-06-14 ENCOUNTER — Inpatient Hospital Stay (HOSPITAL_COMMUNITY): Payer: PRIVATE HEALTH INSURANCE | Admitting: Occupational Therapy

## 2020-06-14 ENCOUNTER — Inpatient Hospital Stay (HOSPITAL_COMMUNITY): Payer: PRIVATE HEALTH INSURANCE | Admitting: Physical Therapy

## 2020-06-14 LAB — GLUCOSE, CAPILLARY
Glucose-Capillary: 165 mg/dL — ABNORMAL HIGH (ref 70–99)
Glucose-Capillary: 140 mg/dL — ABNORMAL HIGH (ref 70–99)
Glucose-Capillary: 289 mg/dL — ABNORMAL HIGH (ref 70–99)
Glucose-Capillary: 318 mg/dL — ABNORMAL HIGH (ref 70–99)

## 2020-06-14 MED ORDER — INSULIN GLARGINE 100 UNIT/ML SOLOSTAR PEN: 26.0000 [IU] | PEN_INJECTOR | Freq: Every day | SUBCUTANEOUS | 11 refills | Status: AC

## 2020-06-14 MED ORDER — HYDROMORPHONE HCL 2 MG PO TABS: 2.0000 mg | ORAL_TABLET | Freq: Every evening | ORAL | 0 refills | Status: DC | PRN

## 2020-06-14 MED ORDER — TOPIRAMATE 50 MG PO TABS: 50.0000 mg | ORAL_TABLET | Freq: Two times a day (BID) | ORAL | 0 refills | Status: DC

## 2020-06-14 MED ORDER — NOVOLOG FLEXPEN 100 UNIT/ML ~~LOC~~ SOPN: 8.0000 [IU] | PEN_INJECTOR | Freq: Three times a day (TID) | SUBCUTANEOUS | 11 refills | Status: AC

## 2020-06-14 MED ORDER — METFORMIN HCL 500 MG PO TABS: 500.0000 mg | ORAL_TABLET | Freq: Two times a day (BID) | ORAL | 0 refills | Status: DC

## 2020-06-14 MED ORDER — OXYCODONE HCL 5 MG PO TABS: 5.0000 mg | ORAL_TABLET | ORAL | 0 refills | Status: DC | PRN

## 2020-06-14 NOTE — Progress Notes (Signed)
Physical Therapy Session Note  Patient Details  Name: Shannon [name] MRN: [id number] Date of Birth: 13-Mar-1966  Today's Date: 06/14/2020 PT Individual Time: 6578-4696 PT Individual Time Calculation (min): 40 min   Short Term Goals: Week 1:  PT Short Term Goal 1 (Week 1): STG = LTG due to short LOS  Skilled Therapeutic Interventions/Progress Updates:     PT instructed pt in TUG: 11.29 sec (average of 3 trials; >13.5 sec indicates increased fall risk)  Gait training without AD through hospital 548f, 3062f ~100029fncluding dynamic gait training of simulated community environment of hospital gift shop and over cement sidewalk. No LOB throughout. Pt picked object up from floor in gift shop with supervision assist and min cues for squat technique; increased time to return to standing due to LE weakness. Pt required 2 seated rest  Break throughout gait training in community environment due to fatigue and mild dizziness. Patient returned to room and left sitting in recliner with call bell in reach and all needs met.        Therapy Documentation Precautions:  Precautions Precautions: Fall Precaution Comments: orthostatic hypotension Restrictions Weight Bearing Restrictions: No General:   Vital Signs: Therapy Vitals Temp: 97.8 F (36.6 C) Temp Source: Oral Pulse Rate: 94 Resp: 16 BP: 102/69 Patient Position (if appropriate): Sitting Oxygen Therapy SpO2: 96 % O2 Device: Room Air Pain:  5/10 C spine. Discomfort. Pt repositioned  Balance: Balance Balance Assessed: Yes Standardized Balance Assessment Standardized Balance Assessment: Timed Up and Go Test Timed Up and Go Test TUG: Normal TUG Normal TUG (seconds): 11.1 Static Sitting Balance Static Sitting - Balance Support: Feet supported Static Sitting - Level of Assistance: 7: Independent Dynamic Sitting Balance Dynamic Sitting - Balance Support: Feet supported Dynamic Sitting - Level of Assistance: 7:  Independent Static Standing Balance Static Standing - Balance Support: No upper extremity supported Static Standing - Level of Assistance: 7: Independent Dynamic Standing Balance Dynamic Standing - Balance Support: No upper extremity supported;During functional activity Dynamic Standing - Level of Assistance: 7: Independent    Therapy/Group: Individual Therapy  AusLorie Phenix14/2022, 3:33 PM

## 2020-06-14 NOTE — Plan of Care (Signed)
  Problem: Consults Goal: RH BRAIN INJURY PATIENT EDUCATION Description: Description: See Patient Education module for eduction specifics Outcome: Progressing   Problem: RH BOWEL ELIMINATION Goal: RH STG MANAGE BOWEL WITH ASSISTANCE Description: STG Manage Bowel with  mod I Assistance. Outcome: Progressing Goal: RH STG MANAGE BOWEL W/MEDICATION W/ASSISTANCE Description: STG Manage Bowel with Medication with  mod I Assistance. Outcome: Progressing   Problem: RH SKIN INTEGRITY Goal: RH STG ABLE TO PERFORM INCISION/WOUND CARE W/ASSISTANCE Description: STG Able To Perform Incision/Wound Care With cues/reminders Assistance. Outcome: Progressing   Problem: RH SAFETY Goal: RH STG ADHERE TO SAFETY PRECAUTIONS W/ASSISTANCE/DEVICE Description: STG Adhere to Safety Precautions With  cues/reminders Assistance/Device. Outcome: Progressing   Problem: RH PAIN MANAGEMENT Goal: RH STG PAIN MANAGED AT OR BELOW PT'S PAIN GOAL Description: At or below level 4 Outcome: Progressing   Problem: RH KNOWLEDGE DEFICIT BRAIN INJURY Goal: RH STG INCREASE KNOWLEDGE OF SELF CARE AFTER BRAIN INJURY Description: Patient and family will be able to manage care at discharge using handouts and educational materials independently  Outcome: Progressing   Problem: RH Vision Goal: RH LTG Vision (Specify) Outcome: Progressing

## 2020-06-14 NOTE — Discharge Instructions (Signed)
Inpatient Rehab Discharge Instructions  Shannon [name] Discharge date and time: No discharge date for patient encounter.   Activities/Precautions/ Functional Status: Activity: activity as tolerated Diet: diabetic diet Wound Care: Routine skin checks Functional status:  ___ No restrictions     ___ Walk up steps independently ___ 24/7 supervision/assistance   ___ Walk up steps with assistance ___ Intermittent supervision/assistance  ___ Bathe/dress independently ___ Walk with walker     _x__ Bathe/dress with assistance ___ Walk Independently    ___ Shower independently ___ Walk with assistance    ___ Shower with assistance ___ No alcohol     ___ Return to work/school ________  COMMUNITY REFERRALS UPON DISCHARGE:     Outpatient: PT     OT        ST             Agency: [facility]              Appointment Date/Time:*Please expect follow-up within 7-10 business days to schedule your home visit. If you do not receive follow-up, be sure to contact the site directly.   Medical Equipment/Items Ordered: rollator                                                 Agency/Supplier: [employer] [phone]  GENERAL COMMUNITY RESOURCES FOR PATIENT/FAMILY: Neuropsych appointment- Dr. Sima Matas [phone]); appt on April 2 at 8am .   Special Instructions: No driving smoking or alcohol   My questions have been answered and I understand these instructions. I will adhere to these goals and the provided educational materials after my discharge from the hospital.  Patient/Caregiver Signature _______________________________ Date __________  Clinician Signature _______________________________________ Date __________  Please bring this form and your medication list with you to all your follow-up doctor's appointments.

## 2020-06-14 NOTE — Progress Notes (Signed)
Physical Therapy Discharge Summary  Patient Details  Name: Shannon [name] MRN: [id number] Date of Birth: 23-Mar-1966  Today's Date: 06/14/2020 PT Individual Time: 0263-7858 PT Individual Time Calculation (min): 28 min    Patient has met 9 of 9 long term goals due to improved activity tolerance, improved balance, improved postural control and increased strength.  Patient to discharge at an ambulatory level Modified Independent.    Reasons goals not met: NA  Recommendation:  Patient will benefit from ongoing skilled PT services in outpatient setting to continue to advance safe functional mobility, address ongoing impairments in balance, endurance mild ataxia, and minimize fall risk.  Equipment: Recommended Rollator  Reasons for discharge: treatment goals met and discharge from hospital  Patient/family agrees with progress made and goals achieved: Yes   Skilled Therapeutic Interventions: Pt received seated in recliner and agrees to therapy. Suit to stand independent. Pt ambulates multiple bouts during session, >300', independently but with reduced gait speed, trunk rotation, and arm swing. PT provides education following ambulation on gait deficits and strategies to improved gait pattern. Pt completes car transfer and ramp navigation with supervision and cues on sequencing. Pt completes x12 6" steps with cues for safety. Pt then performs 5x sit to stand x2 sets with cues on performing following discharge for strength and balance training. Pt ambulates back to room. Left seated in recliner with all needs within reach.  PT Discharge Precautions/Restrictions Precautions Precautions: Fall Precaution Comments: orthostatic hypotension Restrictions Weight Bearing Restrictions: No Vision/Perception  Vision - Assessment Eye Alignment: Within Functional Limits Ocular Range of Motion: Within Functional Limits Alignment/Gaze Preference: Within Defined Limits Tracking/Visual Pursuits:  Able to track stimulus in all quads without difficulty Saccades: Within functional limits Convergence: Within functional limits Diplopia Assessment: Objects split on top of one another;Present in near gaze;Present in primary gaze Perception Perception: Within Functional Limits Praxis Praxis: Intact  Cognition Overall Cognitive Status: Impaired/Different from baseline Arousal/Alertness: Awake/alert Orientation Level: Oriented X4 Safety/Judgment: Appears intact Sensation Sensation Light Touch: Appears Intact Hot/Cold: Appears Intact Proprioception: Appears Intact Stereognosis: Appears Intact Coordination Gross Motor Movements are Fluid and Coordinated: Yes Fine Motor Movements are Fluid and Coordinated: Yes Finger Nose Finger Test: [facility] Motor  Motor Motor - Discharge Observations: very mild ataxia with gait, otherwise functional  Mobility Bed Mobility Bed Mobility: Sit to Supine;Supine to Sit Supine to Sit: Independent Sit to Supine: Independent Transfers Transfers: Sit to Stand;Stand to Sit;Stand Pivot Transfers Sit to Stand: Independent Stand to Sit: Independent Stand Pivot Transfers: Independent Locomotion  Gait Ambulation: Yes Gait Assistance: Independent Gait Distance (Feet): 300 Feet Gait Gait: Yes Gait Pattern: Impaired Gait Pattern: Decreased trunk rotation Gait velocity: reduced Stairs / Additional Locomotion Stairs: Yes Stairs Assistance: Supervision/Verbal cueing Stair Management Technique: No rails Number of Stairs: 12 Height of Stairs: 6 Ramp: Supervision/Verbal cueing Curb: Supervision/Verbal cueing Wheelchair Mobility Wheelchair Mobility: No  Trunk/Postural Assessment  Postural Control Postural Control: Within Functional Limits  Balance Balance Balance Assessed: Yes Standardized Balance Assessment Standardized Balance Assessment: Timed Up and Go Test Timed Up and Go Test TUG: Normal TUG Normal TUG (seconds): 11.1 Static Sitting  Balance Static Sitting - Balance Support: Feet supported Static Sitting - Level of Assistance: 7: Independent Dynamic Sitting Balance Dynamic Sitting - Balance Support: Feet supported Dynamic Sitting - Level of Assistance: 7: Independent Static Standing Balance Static Standing - Balance Support: No upper extremity supported Static Standing - Level of Assistance: 7: Independent Dynamic Standing Balance Dynamic Standing - Balance Support: No upper extremity supported;During  functional activity Dynamic Standing - Level of Assistance: 7: Independent Extremity Assessment  RUE Assessment RUE Assessment: Within Functional Limits LUE Assessment LUE Assessment: Within Functional Limits RLE Assessment RLE Assessment: Within Functional Limits LLE Assessment LLE Assessment: Within Functional Limits    Breck Coons, PT, DPT 06/14/2020, 3:37 PM

## 2020-06-14 NOTE — Progress Notes (Signed)
Occupational Therapy Session Note  Patient Details  Name: Ramsey Midgett MRN: [id number] Date of Birth: 1966-02-13  Today's Date: 06/14/2020 OT Individual Time: 1105-1210 OT Individual Time Calculation (min): 65 min    Short Term Goals: Week 1:  OT Short Term Goal 1 (Week 1): STG = LTG d/t ELOS     Skilled Therapeutic Interventions/Progress Updates:      Pt seen for BADL retraining of toileting, bathing, and dressing with a focus on completing tasks in a timely manner at an independent level. Pt did extremely well today and did not require any assistance to complete any task with self care. Pt showered, dressed, fixed her hair, picked up belongings in her room for light house keeping all independently.  Pt ambulated to laundry room to demonstrate how she could reach into low dryer without A. Reevaluated vision. Her saccades, tracking, convergence all appear to be normal BUT she continues to have diplopia with looking at objects in near range.   Reviewed single eye exercises she can do to improve L visual motor skills. Recommended she f/u with a neuro optometrist.   Discussed healthy lifestyle options for eating and exercises. Pt is very motivated. She is doing well but will need a rollator for endurance with going out and taking long walks and providing a safe place to rest as she undergoes radiation treatments.   Pt is now independent in her room.    Therapy Documentation Precautions:  Precautions Precautions: Fall Precaution Comments: orthostatic hypotension Restrictions Weight Bearing Restrictions: No Vital Signs: Therapy Vitals Temp: 97.8 F (36.6 C) Temp Source: Oral Pulse Rate: 94 Resp: 16 BP: 102/69 Patient Position (if appropriate): Sitting Oxygen Therapy SpO2: 96 % O2 Device: Room Air Pain: no c/o pain     Therapy/Group: Individual Therapy  SAGUIER,JULIA 06/14/2020, 1:15 PM

## 2020-06-14 NOTE — Progress Notes (Signed)
Inpatient Rehabilitation Care Coordinator Discharge Note  The overall goal for the admission was met for:   Discharge location: Yes. D/c to home, and mother will be staying with her.   Length of Stay: Yes. 8 days.   Discharge activity level: Yes. Mod I  Home/community participation: Yes. Limited.  Services provided included: MD, RD, PT, OT, SLP, RN, CM, TR, Pharmacy, Neuropsych and SW  Financial Services: Private Insurance: [employer]  Choices offered to/list presented to:N/A  Follow-up services arranged: Outpatient: [facility] for PT/OT/SLP  And DME: [facility] for rollator  Comments (or additional information): contact pt [phone] Or pt mother Marcie Bal [phone]  Patient/Family verbalized understanding of follow-up arrangements: Yes  Individual responsible for coordination of the follow-up plan: Pt to have assistance with coordinating care needs.   Confirmed correct DME delivered: Rana Snare 06/14/2020    Rana Snare

## 2020-06-14 NOTE — Progress Notes (Signed)
Inpatient Diabetes Program Recommendations  AACE/ADA: New Consensus Statement on Inpatient Glycemic Control (2015)  Target Ranges:  Prepandial:   less than 140 mg/dL      Peak postprandial:   less than 180 mg/dL (1-2 hours)      Critically ill patients:  140 - 180 mg/dL   Lab Results  Component Value Date   GLUCAP 289 (H) 06/14/2020   HGBA1C 9.7 (H) 06/02/2020    Inpatient Diabetes Program Recommendations:   Educated patient on and assisted with placement of Libre glucose censor on Left upper back of arm. Patient requested Elenor Legato for home and Dr. Naaman Plummer gave verbal order.  Patient acknowledged understanding and has booklets for continued review @ home.  Thank you, Nani Gasser. Etienne Millward, RN, MSN, CDE  Diabetes Coordinator Inpatient Glycemic Control Team Team Pager [phone] (8am-5pm) 06/14/2020 5:07 PM

## 2020-06-14 NOTE — Progress Notes (Signed)
Speech Language Pathology Discharge Summary  Patient Details  Name: Shannon [name] MRN: [id number] Date of Birth: 03-Oct-1965  Today's Date: 06/14/2020 SLP Individual Time: 0725-0820 SLP Individual Time Calculation (min): 55 min   Skilled Therapeutic Interventions:  Skilled treatment session focused on cognitive goals. SLP facilitated session by providing extra time and overall Mod I to complete a complex, functional task that focused on meal planning, budgeting and generating a grocery list. Overall, patient is excited to discharge and had no questions for SLP at this time. Patient transferred back to bed at end of session.    Patient has met 2 of 2 long term goals.  Patient to discharge at overall Supervision;Modified Independent level.   Reasons goals not met: N/A   Clinical Impression/Discharge Summary: Patient has made functional gains and has met 2 of 2 LTGs this admission. Currently, patient is completing functional and mildly complex tasks safely in regards to problem solving, organization, reasoning and recall with overall supervision-Mod I. Patient education is complete and patient will discharge home with assistance from family. Patient would benefit from f/u outpatient SLP services to maximize her cognitive functioning and overall functional independence.   Care Partner:  Caregiver Able to Provide Assistance: Yes  Type of Caregiver Assistance: Physical;Cognitive  Recommendation:  Outpatient SLP  Rationale for SLP Follow Up: Maximize cognitive function and independence   Equipment: N/A   Reasons for discharge: Discharged from hospital;Treatment goals met   Patient/Family Agrees with Progress Made and Goals Achieved: Yes    [name], [place] 06/14/2020, 6:46 AM

## 2020-06-14 NOTE — Progress Notes (Signed)
[facility] PHYSICAL MEDICINE & REHABILITATION PROGRESS NOTE   Subjective/Complaints: Doing well. Headaches are under control. Took dilaudid last night.   ROS: Patient denies fever, rash, sore throat, blurred vision, nausea, vomiting, diarrhea, cough, shortness of breath or chest pain, joint or back pain,   or mood change.    Objective:   No results found. No results for input(s): WBC, HGB, HCT, PLT in the last 72 hours. No results for input(s): NA, K, CL, CO2, GLUCOSE, BUN, CREATININE, CALCIUM in the last 72 hours.  Intake/Output Summary (Last 24 hours) at 06/14/2020 1205 Last data filed at 06/14/2020 0700 Gross per 24 hour  Intake 740 ml  Output --  Net 740 ml        Physical Exam: Vital Signs Blood pressure 103/69, pulse 92, temperature (!) 97.5 F (36.4 C), temperature source Oral, resp. rate 16, height 5\' 8"  (1.727 m), last menstrual period 02/22/2019, SpO2 95 %. Constitutional: No distress . Vital signs reviewed. HEENT: EOMI, oral membranes moist Neck: supple Cardiovascular: RRR without murmur. No JVD    Respiratory/Chest: CTA Bilaterally without wheezes or rales. Normal effort    GI/Abdomen: BS +, non-tender, non-distended Ext: no clubbing, cyanosis, or edema Psych: pleasant and cooperative Skin: cervical incision CDI   Neuro: Alert and oriented x 3. Normal insight and awareness. Intact Memory. Normal language and speech. Cranial nerve exam unremarkable.  Sensory exam is normal. Reflexes are 2+ in all 4's. Fine motor coordination is intac generally for finger to nose on right. No tremors. Motor function is grossly 5/5.  Musculoskeletal: Full ROM, No pain with AROM or PROM in the neck, trunk, or extremities. Posture appropriate   Assessment/Plan: 1. Functional deficits which require 3+ hours per day of interdisciplinary therapy in a comprehensive inpatient rehab setting.  Physiatrist is providing close team supervision and 24 hour management of active medical problems  listed below.  Physiatrist and rehab team continue to assess barriers to discharge/monitor patient progress toward functional and medical goals  Care Tool:  Bathing    Body parts bathed by patient: Right arm,Left arm,Chest,Abdomen,Front perineal area,Buttocks,Right upper leg,Left upper leg,Right lower leg,Left lower leg,Face         Bathing assist Assist Level: Independent with assistive device     Upper Body Dressing/Undressing Upper body dressing   What is the patient wearing?: Pull over shirt,Bra    Upper body assist Assist Level: Independent    Lower Body Dressing/Undressing Lower body dressing      What is the patient wearing?: Underwear/pull up,Pants     Lower body assist Assist for lower body dressing: Independent     Toileting Toileting    Toileting assist Assist for toileting: Independent Assistive Device Comment: walker   Transfers Chair/bed transfer  Transfers assist     Chair/bed transfer assist level: Supervision/Verbal cueing Chair/bed transfer assistive device: Programmer, multimedia   Ambulation assist      Assist level: Supervision/Verbal cueing Assistive device: No Device Max distance: 300'   Walk 10 feet activity   Assist     Assist level: Supervision/Verbal cueing Assistive device: No Device   Walk 50 feet activity   Assist    Assist level: Supervision/Verbal cueing Assistive device: No Device    Walk 150 feet activity   Assist Walk 150 feet activity did not occur: Safety/medical concerns  Assist level: Supervision/Verbal cueing Assistive device: No Device    Walk 10 feet on uneven surface  activity   Assist     Assist  level: Minimal Assistance - Patient > 75%     Wheelchair     Assist Will patient use wheelchair at discharge?: No   Wheelchair activity did not occur: N/A         Wheelchair 50 feet with 2 turns activity    Assist    Wheelchair 50 feet with 2 turns activity  did not occur: N/A       Wheelchair 150 feet activity     Assist  Wheelchair 150 feet activity did not occur: N/A       Blood pressure 103/69, pulse 92, temperature (!) 97.5 F (36.4 C), temperature source Oral, resp. rate 16, height 5\' 8"  (1.727 m), last menstrual period 02/22/2019, SpO2 95 %.  Medical Problem List and Plan: 1.  Dizziness with blurred vision and decreased functional mobility secondary to left cerebellar intra-axial lesion path + for metastatic adenocarcinoma of colon.  Status post suboccipital craniectomy resection of cerebellar mass 05/29/2020.  Decadron tapered             -patient may shower             -ELOS/Goals: 1/15  -Patient to see MD in the office for transitional care encounter in 1-2 weeks.    -MRI/XRT delayed to allow time for effects of iron infusion to clear 2.  Antithrombotics: -DVT/anticoagulation: SCDs             -antiplatelet therapy: N/A 3. Pain Management: Oxycodone as needed-  -can use oral dilaudid for severe h/a  - topamax, increased to 50mg  BID on 1/10 with goal still of removing dilaudid   -headaches improving, decrease dilaudid to night time only  -1/14 will send home with seven to eight 2mg  dilaudid  -may use oxycodone 5mg -10mg  as well--transition solely to this  -assured her that headaches should improve over the next days and weeks ahead 4. Mood: Luvox 300 mg nightly, Lamictal 150 mg daily             -antipsychotic agents: Seroquel 800 mg nightly  -is set up to see Dr. Sima Matas in April  - Admits to depression at times, but has stayed up beat while here. 5. Neuropsych: This patient is capable of making decisions on her own behalf. 6. Skin/Wound Care: Routine skin checks.   -sutures out 7. Fluids/Electrolytes/Nutrition: Routine in and outs   -intake adequate at present.   8.  Diabetes mellitus.  Hemoglobin A1c 9.6.  Glucophage 500 mg twice daily, NovoLog 6 units 3 times daily, Lantus insulin 20 units daily.  Check blood  sugars before meals and at bedtime  -cbg's have been poorly controlled in 200'-300's d/t decadron  -increased lantus to 26u daily on 1/10, continue same novolog    -some improvement last 24 hours  -Tapered dexamethasone to 2mg  daily--stay with this dose  1/13 increased novolog to 8u with meals  1/14 cbg's seem to be falling---will need further adjustment at home depending upon how long she's on decadron 9.  Hypothyroidism.  Synthroid 10.  Hyperlipidemia.  Lipitor 11.  GERD.  Protonix 12.  Obesity.  BMI 31.32.  Dietary follow-up 13.  OCD.  Continue Luvox 15. Orthostatic hypotension? BP ok so far.    -encouraged adequate PO  -THT's, binder if needed  1/12-14 bp's more robust 16. Constipation- added miralax prn   -sorbitol 1/6 with large bm   -moving bowels   LOS: 8 days A FACE TO FACE EVALUATION WAS PERFORMED  Meredith Staggers 06/14/2020, 12:05 PM

## 2020-06-14 NOTE — Progress Notes (Signed)
Occupational Therapy Discharge Summary  Patient Details  Name: Shannon [name] MRN: [id number] Date of Birth: 06-Jan-1966     Patient has met 12 of 12 long term goals due to improved activity tolerance, improved balance, postural control and improved coordination.  Patient to discharge at overall Modified Independent level.  Patient's care partner is independent to provide the necessary physical and cognitive assistance at discharge.    Reasons goals not met: n/a  Recommendation:  Patient will benefit from ongoing skilled OT services in outpatient setting to continue to advance functional skills in the area of iADL and Vocation with a focus on visual rehab for diplopia, high level dynamic balance  Equipment: rollator -   will need a rollator for endurance with going out and taking long walks and providing a safe place to rest as she undergoes radiation treatments. Pt also has a history of orthostatic hypotension.   Reasons for discharge: treatment goals met  Patient/family agrees with progress made and goals achieved: Yes  OT Discharge Precautions/Restrictions  Precautions Precautions: Fall Precaution Comments: orthostatic hypotension Restrictions Weight Bearing Restrictions: No     ADL ADL Eating: Independent Grooming: Independent Where Assessed-Grooming: Standing at sink Upper Body Bathing: Independent Where Assessed-Upper Body Bathing: Shower Lower Body Bathing: Modified independent Where Assessed-Lower Body Bathing: Shower Upper Body Dressing: Independent Where Assessed-Upper Body Dressing: Standing at sink Lower Body Dressing: Independent Where Assessed-Lower Body Dressing: Standing at sink Toileting: Independent Where Assessed-Toileting: Glass blower/designer: Programmer, applications Method: Wellsite geologist Transfer: IT consultant Method: Heritage manager: Civil engineer, contracting with back,Grab  bars Vision Baseline Vision/History: Wears glasses Wears Glasses: At all times Patient Visual Report: Diplopia Vision Assessment?: Yes Eye Alignment: Within Functional Limits Ocular Range of Motion: Within Functional Limits Alignment/Gaze Preference: Within Defined Limits Tracking/Visual Pursuits: Able to track stimulus in all quads without difficulty Saccades: Within functional limits Convergence: Within functional limits Visual Fields: No apparent deficits Diplopia Assessment: Objects split on top of one another;Present in near gaze;Present in primary gaze Perception  Perception: Within Functional Limits Praxis Praxis: Intact Cognition  new recall memory impaired Sensation Sensation Light Touch: Appears Intact Hot/Cold: Appears Intact Proprioception: Appears Intact Stereognosis: Appears Intact Coordination Gross Motor Movements are Fluid and Coordinated: Yes Fine Motor Movements are Fluid and Coordinated: Yes Finger Nose Finger Test: [facility] Motor  Motor Motor - Discharge Observations: very mild ataxia with gait, otherwise functional Mobility    independent within home, will need rollator for long distances outside Trunk/Postural Assessment  Postural Control Postural Control: Within Functional Limits  Balance Balance Balance Assessed: Yes Standardized Balance Assessment Standardized Balance Assessment: Timed Up and Go Test Timed Up and Go Test TUG: Normal TUG Normal TUG (seconds): 11.1 Static Sitting Balance Static Sitting - Balance Support: Feet supported Static Sitting - Level of Assistance: 7: Independent Dynamic Sitting Balance Dynamic Sitting - Balance Support: Feet supported Dynamic Sitting - Level of Assistance: 7: Independent Static Standing Balance Static Standing - Balance Support: No upper extremity supported Static Standing - Level of Assistance: 7: Independent Dynamic Standing Balance Dynamic Standing - Balance Support: No upper extremity  supported;During functional activity Dynamic Standing - Level of Assistance: 7: Independent Extremity/Trunk Assessment RUE Assessment RUE Assessment: Within Functional Limits LUE Assessment LUE Assessment: Within Functional Limits   Miamor Ayler 06/14/2020, 1:25 PM

## 2020-06-14 NOTE — Progress Notes (Signed)
Patient ID: Shannon [name], female   DOB: 1965-10-30, 55 y.o.   MRN: [id number]   SW ordered rollator with [employer] via parachute.   Loralee Pacas, MSW, [facility] Office: [phone] Cell: [phone] Fax: [phone]

## 2020-06-14 NOTE — Plan of Care (Signed)
  Problem: RH Balance Goal: LTG Patient will maintain dynamic standing with ADLs (OT) Description: LTG:  Patient will maintain dynamic standing balance with assist during activities of daily living (OT)  Outcome: Completed/Met   Problem: RH Grooming Goal: LTG Patient will perform grooming w/assist,cues/equip (OT) Description: LTG: Patient will perform grooming with assist, with/without cues using equipment (OT) Outcome: Completed/Met   Problem: RH Bathing Goal: LTG Patient will bathe all body parts with assist levels (OT) Description: LTG: Patient will bathe all body parts with assist levels (OT) Outcome: Completed/Met   Problem: RH Dressing Goal: LTG Patient will perform upper body dressing (OT) Description: LTG Patient will perform upper body dressing with assist, with/without cues (OT). Outcome: Completed/Met Goal: LTG Patient will perform lower body dressing w/assist (OT) Description: LTG: Patient will perform lower body dressing with assist, with/without cues in positioning using equipment (OT) Outcome: Completed/Met   Problem: RH Toileting Goal: LTG Patient will perform toileting task (3/3 steps) with assistance level (OT) Description: LTG: Patient will perform toileting task (3/3 steps) with assistance level (OT)  Outcome: Completed/Met   Problem: RH Vision Goal: RH LTG Vision (Specify) Outcome: Completed/Met   Problem: RH Laundry Goal: LTG Patient will perform laundry w/assist, cues (OT) Description: LTG: Patient will perform laundry with assistance, with/without cues (OT). Outcome: Completed/Met   Problem: RH Light Housekeeping Goal: LTG Patient will perform light housekeeping w/assist (OT) Description: LTG: Patient will perform light housekeeping with assistance, with/without cues (OT). Outcome: Completed/Met   Problem: RH Toilet Transfers Goal: LTG Patient will perform toilet transfers w/assist (OT) Description: LTG: Patient will perform toilet transfers with  assist, with/without cues using equipment (OT) Outcome: Completed/Met   Problem: RH Tub/Shower Transfers Goal: LTG Patient will perform tub/shower transfers w/assist (OT) Description: LTG: Patient will perform tub/shower transfers with assist, with/without cues using equipment (OT) Outcome: Completed/Met   Problem: RH Memory Goal: LTG Patient will demonstrate ability for day to day recall/carry over during activities of daily living with assistance level (OT) Description: LTG:  Patient will demonstrate ability for day to day recall/carry over during activities of daily living with assistance level (OT). Outcome: Completed/Met

## 2020-06-15 LAB — GLUCOSE, CAPILLARY: Glucose-Capillary: 266 mg/dL — ABNORMAL HIGH (ref 70–99)

## 2020-06-15 MED ORDER — HEPARIN SOD (PORK) LOCK FLUSH 100 UNIT/ML IV SOLN
500.0000 [IU] | INTRAVENOUS | Status: AC | PRN
  Administered 2020-06-15: 500 [IU]
  Filled 2020-06-15: qty 5

## 2020-06-15 NOTE — Progress Notes (Signed)
Patient discharged to home per  wheelchair accompanied by mother and NT. Per pt discharge instructions done yesterday no further questions noted. Dr. Ranell Patrick done with rounds and porta cath capped by IV team.

## 2020-06-15 NOTE — Progress Notes (Signed)
[facility] PHYSICAL MEDICINE & REHABILITATION PROGRESS NOTE   Subjective/Complaints: Headaches are currently well controlled at 6/10. She has been using the dilaudid at nighttime.  She has been using the bathroom once per day with colace and senna. Dizziness is resolved!   ROS: Patient denies fever, rash, sore throat, blurred vision, nausea, vomiting, diarrhea, cough, shortness of breath or chest pain, joint or back pain,   or mood change.    Objective:   No results found. No results for input(s): WBC, HGB, HCT, PLT in the last 72 hours. No results for input(s): NA, K, CL, CO2, GLUCOSE, BUN, CREATININE, CALCIUM in the last 72 hours.  Intake/Output Summary (Last 24 hours) at 06/15/2020 1010 Last data filed at 06/15/2020 0818 Gross per 24 hour  Intake 520 ml  Output --  Net 520 ml        Physical Exam: Vital Signs Blood pressure 116/67, pulse 79, temperature 97.9 F (36.6 C), resp. rate 16, height 5\' 8"  (1.727 m), last menstrual period 02/22/2019, SpO2 96 %. Gen: no distress, normal appearing HEENT: oral mucosa pink and moist, NCAT Cardio: Reg rate Chest: normal effort, normal rate of breathing Abd: soft, non-distended Ext: no edema Psych: pleasant, normal affect Skin: cervical incision CDI   Neuro: Alert and oriented x 3. Normal insight and awareness. Intact Memory. Normal language and speech. Cranial nerve exam unremarkable.  Sensory exam is normal. Reflexes are 2+ in all 4's. Fine motor coordination is intac generally for finger to nose on right. No tremors. Motor function is grossly 5/5.  Musculoskeletal: Full ROM, No pain with AROM or PROM in the neck, trunk, or extremities. Posture appropriate   Assessment/Plan: 1. Functional deficits which require 3+ hours per day of interdisciplinary therapy in a comprehensive inpatient rehab setting.  Physiatrist is providing close team supervision and 24 hour management of active medical problems listed below.  Physiatrist and  rehab team continue to assess barriers to discharge/monitor patient progress toward functional and medical goals  Care Tool:  Bathing    Body parts bathed by patient: Right arm,Left arm,Chest,Abdomen,Front perineal area,Buttocks,Right upper leg,Left upper leg,Right lower leg,Left lower leg,Face         Bathing assist Assist Level: Independent with assistive device     Upper Body Dressing/Undressing Upper body dressing   What is the patient wearing?: Pull over shirt,Bra    Upper body assist Assist Level: Independent    Lower Body Dressing/Undressing Lower body dressing      What is the patient wearing?: Underwear/pull up,Pants     Lower body assist Assist for lower body dressing: Independent     Toileting Toileting    Toileting assist Assist for toileting: Independent Assistive Device Comment: walker   Transfers Chair/bed transfer  Transfers assist     Chair/bed transfer assist level: Independent Chair/bed transfer assistive device: Museum/gallery exhibitions officer assist      Assist level: Independent Assistive device: No Device Max distance: 300'   Walk 10 feet activity   Assist     Assist level: Independent Assistive device: No Device   Walk 50 feet activity   Assist    Assist level: Independent Assistive device: No Device    Walk 150 feet activity   Assist Walk 150 feet activity did not occur: Safety/medical concerns  Assist level: Independent Assistive device: No Device    Walk 10 feet on uneven surface  activity   Assist     Assist level: Supervision/Verbal cueing  Wheelchair     Assist Will patient use wheelchair at discharge?: No   Wheelchair activity did not occur: N/A         Wheelchair 50 feet with 2 turns activity    Assist    Wheelchair 50 feet with 2 turns activity did not occur: N/A       Wheelchair 150 feet activity     Assist  Wheelchair 150 feet activity did not  occur: N/A       Blood pressure 116/67, pulse 79, temperature 97.9 F (36.6 C), resp. rate 16, height 5\' 8"  (1.727 m), last menstrual period 02/22/2019, SpO2 96 %.  Medical Problem List and Plan: 1.  Dizziness with blurred vision and decreased functional mobility secondary to left cerebellar intra-axial lesion path + for metastatic adenocarcinoma of colon.  Status post suboccipital craniectomy resection of cerebellar mass 05/29/2020.  Decadron tapered             -patient may shower             -ELOS/Goals: 1/15  -Patient to see MD in the office for transitional care encounter in 1-2 weeks.    -MRI/XRT delayed to allow time for effects of iron infusion to clear  DC today 2.  Antithrombotics: -DVT/anticoagulation: SCDs             -antiplatelet therapy: N/A 3. Pain Management: Oxycodone as needed-  -can use oral dilaudid for severe h/a  - topamax, increased to 50mg  BID on 1/10 with goal still of removing dilaudid   -headaches improving, decrease dilaudid to night time only  -1/14 will send home with seven to eight 2mg  dilaudid  -may use oxycodone 5mg -10mg  as well--transition solely to this  -assured her that headaches should improve over the next days and weeks ahead  -encouraged use of Topamax for headaches 4. Mood: Luvox 300 mg nightly, Lamictal 150 mg daily             -antipsychotic agents: Seroquel 800 mg nightly  -is set up to see Dr. Sima Matas in April, discussed with patient  - Admits to depression at times, but has stayed up beat while here. 5. Neuropsych: This patient is capable of making decisions on her own behalf. 6. Skin/Wound Care: Routine skin checks.   -sutures out 7. Fluids/Electrolytes/Nutrition: Routine in and outs   -intake adequate at present.   8.  Diabetes mellitus.  Hemoglobin A1c 9.6.  Glucophage 500 mg twice daily, NovoLog 6 units 3 times daily, Lantus insulin 20 units daily.  Check blood sugars before meals and at bedtime  -cbg's have been poorly  controlled in 200'-300's d/t decadron  -increased lantus to 26u daily on 1/10, continue same novolog    -some improvement last 24 hours  -Tapered dexamethasone to 2mg  daily--stay with this dose  1/13 increased novolog to 8u with meals  1/14 cbg's seem to be falling---will need further adjustment at home depending upon how long she's on decadron  1/15: discussed limited added sugars 9.  Hypothyroidism.  Synthroid 10.  Hyperlipidemia.  Lipitor 11.  GERD.  Protonix 12.  Obesity.  BMI 31.32.  Dietary follow-up 13.  OCD.  Continue Luvox 15. Orthostatic hypotension? BP ok so far.    -encouraged adequate PO  -THT's, binder if needed  1/12-14 bp's more robust 16. Constipation- added miralax prn   -sorbitol 1/6 with large bm   -moving bowels   >30 minutes spent in discharge of patient including review of medications and follow-up appointments, physical examination,  and in answering all patient's questions   LOS: 9 days A FACE TO FACE EVALUATION WAS PERFORMED  Shannon [name] P Fidelia Cathers 06/15/2020, 10:10 AM

## 2020-06-15 NOTE — Plan of Care (Signed)
  Problem: Consults Goal: RH BRAIN INJURY PATIENT EDUCATION Description: Description: See Patient Education module for eduction specifics Outcome: Progressing   Problem: RH KNOWLEDGE DEFICIT BRAIN INJURY Goal: RH STG INCREASE KNOWLEDGE OF SELF CARE AFTER BRAIN INJURY Description: Patient and family will be able to manage care at discharge using handouts and educational materials independently  Outcome: Progressing

## 2020-06-17 ENCOUNTER — Ambulatory Visit: Payer: PRIVATE HEALTH INSURANCE | Admitting: Radiation Oncology

## 2020-06-19 ENCOUNTER — Ambulatory Visit: Payer: PRIVATE HEALTH INSURANCE | Admitting: Radiation Oncology

## 2020-06-25 ENCOUNTER — Encounter: Payer: Self-pay | Admitting: Physical Medicine & Rehabilitation

## 2020-06-25 ENCOUNTER — Other Ambulatory Visit: Payer: Self-pay

## 2020-06-25 ENCOUNTER — Encounter
Payer: PRIVATE HEALTH INSURANCE | Attending: Physical Medicine & Rehabilitation | Admitting: Physical Medicine & Rehabilitation

## 2020-06-25 VITALS — BP 104/71 | HR 107 | Temp 97.9°F | Ht 68.0 in | Wt 196.8 lb

## 2020-06-25 DIAGNOSIS — R739 Hyperglycemia, unspecified: Secondary | ICD-10-CM | POA: Diagnosis present

## 2020-06-25 DIAGNOSIS — C7931 Secondary malignant neoplasm of brain: Secondary | ICD-10-CM | POA: Insufficient documentation

## 2020-06-25 DIAGNOSIS — G9389 Other specified disorders of brain: Secondary | ICD-10-CM | POA: Insufficient documentation

## 2020-06-25 DIAGNOSIS — I951 Orthostatic hypotension: Secondary | ICD-10-CM | POA: Diagnosis not present

## 2020-06-25 DIAGNOSIS — T380X5A Adverse effect of glucocorticoids and synthetic analogues, initial encounter: Secondary | ICD-10-CM | POA: Insufficient documentation

## 2020-06-25 DIAGNOSIS — G441 Vascular headache, not elsewhere classified: Secondary | ICD-10-CM | POA: Diagnosis present

## 2020-06-25 NOTE — Progress Notes (Addendum)
Subjective:    Patient ID: Shannon [name], female    DOB: Aug 13, 1965, 55 y.o.   MRN: [id number]  HPI Female with history of anxiety depression as well as OCD maintained on Luvox, diabetes mellitus obesity with BMI 31.32 colon cancer 2018 with resection followed by chemotherapy presents for hospital follow up after receiving CIR for left cerebellar metastatic adenocarcinoma of colon s/p suboccipital craniectomy on 05/29/2020.    At discharge she was instructed to follow up with Neuropsych, with whom she has an appointment. She is following up with oncology and rad/onc. Her appointment with Neuro/Onc was moved back and she needs to reschedule. Headaches are improving, taking oxycodone and tylenol at present prn.  She is taking Topamax. CBGs have been labile - she has not followed up with PCP. She continues to orthostasis, denies falls. Bowel movements are regular.  Therapies: Start tomorrow DME: Not required Mobility: Not required at present  Pain Inventory Average Pain 4 Pain Right Now 3 My pain is constant, dull and aching  LOCATION OF PAIN  Head  BOWEL Number of stools per week: 7-10 Oral laxative use No  Type of laxative pills Enema or suppository use No  History of colostomy No  Incontinent No   BLADDER Normal In and out cath, frequency N/a Able to self cath No  Bladder incontinence No  Frequent urination No  Leakage with coughing Yes  Difficulty starting stream Yes  Incomplete bladder emptying Yes    Mobility ability to climb steps?  yes do you drive?  no  Function not employed: date last employed On medical leave as Care Coordinator I need assistance with the following:  meal prep, household duties and shopping Do you have any goals in this area?  yes  Neuro/Psych bladder control problems weakness dizziness confusion depression anxiety loss of taste or smell suicidal thoughts  Prior Studies Any changes since last visit?  no New  Patient  Physicians involved in your care Any changes since last visit?  no New Patient   Family History  Problem Relation Age of Onset  . Irritable bowel syndrome Mother   . Colon polyps Father   . Breast cancer Maternal Grandmother   . Diabetes Maternal Grandmother   . Diabetes Maternal Grandfather   . Breast cancer Paternal Grandmother   . Colon polyps Maternal Uncle   . Stomach cancer Neg Hx   . Pancreatic cancer Neg Hx   . Esophageal cancer Neg Hx   . Colon cancer Neg Hx    Social History   Socioeconomic History  . Marital status: Widowed    Spouse name: Not on file  . Number of children: Not on file  . Years of education: Not on file  . Highest education level: Not on file  Occupational History  . Occupation: care coordinator   Tobacco Use  . Smoking status: Never Smoker  . Smokeless tobacco: Never Used  Vaping Use  . Vaping Use: Never used  Substance and Sexual Activity  . Alcohol use: Yes    Comment: rare  . Drug use: Never  . Sexual activity: Not Currently  Other Topics Concern  . Not on file  Social History Narrative  . Not on file   Social Determinants of Health   Financial Resource Strain: Not on file  Food Insecurity: Not on file  Transportation Needs: Not on file  Physical Activity: Not on file  Stress: Not on file  Social Connections: Not on file   Past Surgical  History:  Procedure Laterality Date  . ABDOMINAL HYSTERECTOMY  05/2019   total laparoscopin with tubes and ovariers removed also.  . APPENDECTOMY  05/2019  . COLON SURGERY  03/2017   Colon resection  . COLONOSCOPY  2019  . SUBOCCIPITAL CRANIECTOMY CERVICAL LAMINECTOMY Left 05/29/2020   Procedure: Suboccipital Craniectomy for Resection of Left Cerebellar Mass;  Surgeon: Kary Kos, MD;  Location: [facility];  Service: Neurosurgery;  Laterality: Left;   Past Medical History:  Diagnosis Date  . Anemia    Iron De  . Anxiety   . Blood clot in vein   . Bowel obstruction ([place])   .  Colon cancer ([place]) 2018   treated with surgery and chemotherapy  . Depression   . Diabetes mellitus without complication ([place] [facility])    Type II  . DVT (deep venous thrombosis) ([place]) 2019   behind right knee- while she wasa on chemo  . GERD (gastroesophageal reflux disease)   . History of blood transfusion   . History of chemotherapy   . History of kidney stones 2012   passed  . Hypothyroidism   . Obesity    BP 104/71   Pulse (!) 107   Temp 97.9 F (36.6 C)   Ht 5\' 8"  (1.727 m)   Wt 196 lb 12.8 oz (89.3 kg)   LMP 02/22/2019 (Approximate)   BMI 29.92 kg/m   Opioid Risk Score:   Fall Risk Score:  `1  Depression screen PHQ 2/9  Depression screen PHQ 2/9 06/25/2020  Decreased Interest 1  Down, Depressed, Hopeless 1  PHQ - 2 Score 2  Altered sleeping 2  Tired, decreased energy 2  Change in appetite 3  Feeling bad or failure about yourself  3  Trouble concentrating 3  Moving slowly or fidgety/restless 2  Suicidal thoughts 2  PHQ-9 Score 19    Review of Systems  Eyes: Positive for visual disturbance.  Genitourinary: Positive for dysuria.       Retention  Neurological: Positive for dizziness, weakness and headaches.  Psychiatric/Behavioral: Positive for confusion and suicidal ideas (NO PLAN).  All other systems reviewed and are negative.     Objective:   Physical Exam  Constitutional: No distress . Vital signs reviewed. HENT: Normocephalic.  Atraumatic. Eyes: EOMI. No discharge. Cardiovascular: No JVD.   Respiratory: Normal effort.  No stridor.   GI: Non-distended.   Skin: Warm and dry.  Intact. Psych: Normal mood.  Normal behavior. Musc: No edema in extremities.  No tenderness in extremities. Gait: WNL Neuro: Alert and oriented [place] intact No dysdiadochokinesia  Motor: 5/5 throughout No ataxia    Assessment & Plan:  Female with history of anxiety depression as well as OCD maintained on Luvox, diabetes mellitus obesity with BMI 31.32 colon cancer 2018 with  resection followed by chemotherapy presents for hospital follow up after receiving CIR for left cerebellar metastatic adenocarcinoma of colon s/p suboccipital craniectomy on 05/29/2020.    1. Dizziness with blurred vision and decreased functional mobility secondary to left cerebellar intra-axial lesion path + for metastatic adenocarcinoma of colon. Status post suboccipital craniectomy resection of cerebellar mass 05/29/2020.    Begin therapies  Steroids per Onc   2. Pain Management - headache:   Continue prn Oxycodone - wean  Tylenol PRN  Continue Topamax  3.  Steroid induced hyperglycemia on Diabetes mellitus .    Labile at present  Follow up with Endo  4. Orthostatic hypotension  Encouraged fluid intake - 64 oz daily  Discussed slow postural  changes  Meds reviewed Referrals reviewed All questions answered

## 2020-07-01 NOTE — Progress Notes (Signed)
Has armband been applied?  Yes  Does patient have an allergy to IV contrast dye?: No   Has patient ever received premedication for IV contrast dye?: No  Does patient take metformin?: Yes  If patient does take metformin when was the last dose:   Date of lab work: 06/07/2020 BUN: 18 CR: 0.97 eGfr: >60  IV site: Right Chest Port  Has IV site been added to flowsheet?  YEs

## 2020-07-02 ENCOUNTER — Ambulatory Visit
Admission: RE | Admit: 2020-07-02 | Discharge: 2020-07-02 | Disposition: A | Discharge status: Home / Self Care | Payer: PRIVATE HEALTH INSURANCE | Source: Ambulatory Visit | Attending: Radiation Oncology | Admitting: Radiation Oncology

## 2020-07-02 ENCOUNTER — Other Ambulatory Visit: Payer: Self-pay

## 2020-07-02 ENCOUNTER — Other Ambulatory Visit: Payer: Self-pay | Admitting: Hematology and Oncology

## 2020-07-02 ENCOUNTER — Inpatient Hospital Stay: Payer: PRIVATE HEALTH INSURANCE | Attending: Oncology | Admitting: Internal Medicine

## 2020-07-02 VITALS — BP 123/83 | HR 89 | Temp 97.3°F | Resp 18 | Ht 68.0 in | Wt 199.9 lb

## 2020-07-02 VITALS — BP 118/82 | HR 97 | Temp 97.8°F | Resp 20 | Ht 68.0 in | Wt 199.2 lb

## 2020-07-02 DIAGNOSIS — C7931 Secondary malignant neoplasm of brain: Secondary | ICD-10-CM | POA: Diagnosis not present

## 2020-07-02 DIAGNOSIS — R5383 Other fatigue: Secondary | ICD-10-CM | POA: Insufficient documentation

## 2020-07-02 DIAGNOSIS — Q043 Other reduction deformities of brain: Secondary | ICD-10-CM | POA: Insufficient documentation

## 2020-07-02 DIAGNOSIS — Z510 Encounter for antineoplastic radiation therapy: Secondary | ICD-10-CM | POA: Insufficient documentation

## 2020-07-02 DIAGNOSIS — G9389 Other specified disorders of brain: Secondary | ICD-10-CM

## 2020-07-02 DIAGNOSIS — H814 Vertigo of central origin: Secondary | ICD-10-CM

## 2020-07-02 DIAGNOSIS — D509 Iron deficiency anemia, unspecified: Secondary | ICD-10-CM

## 2020-07-02 DIAGNOSIS — D508 Other iron deficiency anemias: Secondary | ICD-10-CM | POA: Insufficient documentation

## 2020-07-02 DIAGNOSIS — C182 Malignant neoplasm of ascending colon: Secondary | ICD-10-CM | POA: Insufficient documentation

## 2020-07-02 DIAGNOSIS — Z79899 Other long term (current) drug therapy: Secondary | ICD-10-CM | POA: Insufficient documentation

## 2020-07-02 DIAGNOSIS — Z7984 Long term (current) use of oral hypoglycemic drugs: Secondary | ICD-10-CM | POA: Insufficient documentation

## 2020-07-02 DIAGNOSIS — Z794 Long term (current) use of insulin: Secondary | ICD-10-CM | POA: Insufficient documentation

## 2020-07-02 DIAGNOSIS — Z85038 Personal history of other malignant neoplasm of large intestine: Secondary | ICD-10-CM | POA: Insufficient documentation

## 2020-07-02 MED ORDER — HEPARIN SOD (PORK) LOCK FLUSH 100 UNIT/ML IV SOLN
500.0000 [IU] | Freq: Once | INTRAVENOUS | Status: AC
  Administered 2020-07-02: 500 [IU] via INTRAVENOUS

## 2020-07-02 MED ORDER — SODIUM CHLORIDE 0.9% FLUSH
10.0000 mL | Freq: Once | INTRAVENOUS | Status: AC
  Administered 2020-07-02: 10 mL via INTRAVENOUS

## 2020-07-02 NOTE — Progress Notes (Signed)
[facility] [place], [place] [address] [phone]   New Patient Evaluation  Date of Service: 07/02/20 Patient Name: Shannon [name] Patient MRN: [id number] Patient DOB: 1966/01/05 Provider: Ventura Sellers, MD  Identifying Statement:  Shannon [name] is a 55 y.o. female with Brain metastases [facility]) [C79.31] who presents for initial consultation and evaluation regarding cancer associated neurologic deficits.    Referring Provider: Kary Kos, MD [address],  [place] [address]  Primary Cancer: Colon Adenocarcinoma, Stage IV  Oncologic History: 05/29/20: Sub-occipital craniotomy, resection by Dr. Saintclair Halsted.  Path c/w colon adenocarcinoma  History of Present Illness: The patient's records from the referring physician were obtained and reviewed and the patient interviewed to confirm this HPI.  Cory Szymanowski presented to medical attention at the end of December, 2021 with several weeks of progressive balance impairment.  She worsened toward the end to the point of needing full assist with walking, or being mostly bed-bound.  At the same time she developed bouts of vertigo, described as "room spinning around me", for which medical interventions were not effective.  CNS imaging demonstrated large enhancing mass in left cerebellar hemisphere with swelling and compression c/w likely brain metastasis; she underwent craniotomy and resection on 05/29/20 with Dr. Saintclair Halsted.  Following surgery, her symptoms improved considerably.  At present, she is functionally intact aside from issues with short term memory, fatigue, and mood.  Visited with radiation oncology today for post-operative simulation.   Medications: Current Outpatient Medications on File Prior to Visit  Medication Sig Dispense Refill  . acetaminophen (TYLENOL) 325 MG tablet Take 2 tablets (650 mg total) by mouth every 6 (six) hours as needed  for mild pain.    Marland Kitchen atorvastatin (LIPITOR) 10 MG tablet Take 1 tablet (10 mg total) by mouth at bedtime. 30 tablet 0  . dexamethasone (DECADRON) 2 MG tablet Take 1 tablet (2 mg total) by mouth daily. 30 tablet 0  . Dulaglutide (TRULICITY) 1.5 0000000 SOPN Inject into the skin.    . fluvoxaMINE (LUVOX) 100 MG tablet Take 3 tablets (300 mg total) by mouth at bedtime. 270 tablet 1  . hyoscyamine (LEVSIN SL) 0.125 MG SL tablet Place 1 tablet (0.125 mg total) under the tongue every 6 (six) hours as needed. 45 tablet 1  . insulin aspart (NOVOLOG FLEXPEN) 100 UNIT/ML FlexPen Inject 8 Units into the skin 3 (three) times daily with meals. 15 mL 11  . insulin glargine (LANTUS) 100 UNIT/ML Solostar Pen Inject 26 Units into the skin daily. 15 mL 11  . lamoTRIgine (LAMICTAL) 150 MG tablet Take 1 tablet (150 mg total) by mouth daily. 90 tablet 3  . levothyroxine (SYNTHROID) 75 MCG tablet Take 1 tablet (75 mcg total) by mouth daily. 30 tablet 0  . metFORMIN (GLUCOPHAGE) 500 MG tablet Take 1 tablet (500 mg total) by mouth 2 (two) times daily with a meal. 60 tablet 0  . NOVOFINE PEN NEEDLE 32G X 6 MM MISC SMARTSIG:Syringe(s) SUB-Q    . pantoprazole (PROTONIX) 40 MG tablet Take 1 tablet (40 mg total) by mouth at bedtime. 30 tablet 0  . QUEtiapine (SEROQUEL) 400 MG tablet Take 2 tablets (800 mg total) by mouth at bedtime. 30 tablet 0  . topiramate (TOPAMAX) 50 MG tablet Take 1 tablet (50 mg total) by mouth 2 (two) times daily. 60 tablet 0  . HYDROmorphone (DILAUDID) 2 MG tablet Take 1 tablet (2  mg total) by mouth at bedtime as needed for severe pain (severe headache). (Patient not taking: Reported on 07/02/2020) 7 tablet 0  . oxyCODONE (OXY IR/ROXICODONE) 5 MG immediate release tablet Take 1-2 tablets (5-10 mg total) by mouth every 4 (four) hours as needed (moderate pain 5mg , severe pain 10mg ). (Patient not taking: Reported on 07/02/2020) 30 tablet 0  . senna-docusate (SENOKOT-S) 8.6-50 MG tablet Take 2 tablets by mouth  at bedtime. (Patient not taking: Reported on 07/02/2020)     No current facility-administered medications on file prior to visit.    Allergies:  Allergies  Allergen Reactions  . Clindamycin/Lincomycin Rash  . Penicillins Rash    Reaction: 10 years   Past Medical History:  Past Medical History:  Diagnosis Date  . Anemia    Iron De  . Anxiety   . Blood clot in vein   . Bowel obstruction ([place])   . Colon cancer ([place]) 2018   treated with surgery and chemotherapy  . Depression   . Diabetes mellitus without complication ([place])    Type II  . DVT (deep venous thrombosis) ([place]) 2019   behind right knee- while she wasa on chemo  . GERD (gastroesophageal reflux disease)   . History of blood transfusion   . History of chemotherapy   . History of kidney stones 2012   passed  . Hypothyroidism   . Obesity    Past Surgical History:  Past Surgical History:  Procedure Laterality Date  . ABDOMINAL HYSTERECTOMY  05/2019   total laparoscopin with tubes and ovariers removed also.  . APPENDECTOMY  05/2019  . COLON SURGERY  03/2017   Colon resection  . COLONOSCOPY  2019  . SUBOCCIPITAL CRANIECTOMY CERVICAL LAMINECTOMY Left 05/29/2020   Procedure: Suboccipital Craniectomy for Resection of Left Cerebellar Mass;  Surgeon: Kary Kos, MD;  Location: [facility];  Service: Neurosurgery;  Laterality: Left;   Social History:  Social History   Socioeconomic History  . Marital status: Widowed    Spouse name: Not on file  . Number of children: Not on file  . Years of education: Not on file  . Highest education level: Not on file  Occupational History  . Occupation: care coordinator   Tobacco Use  . Smoking status: Never Smoker  . Smokeless tobacco: Never Used  Vaping Use  . Vaping Use: Never used  Substance and Sexual Activity  . Alcohol use: Yes    Comment: rare  . Drug use: Never  . Sexual activity: Not Currently  Other Topics Concern  . Not on file  Social History Narrative  . Not on  file   Social Determinants of Health   Financial Resource Strain: Not on file  Food Insecurity: Not on file  Transportation Needs: Not on file  Physical Activity: Not on file  Stress: Not on file  Social Connections: Not on file  Intimate Partner Violence: Not on file   Family History:  Family History  Problem Relation Age of Onset  . Irritable bowel syndrome Mother   . Colon polyps Father   . Breast cancer Maternal Grandmother   . Diabetes Maternal Grandmother   . Diabetes Maternal Grandfather   . Breast cancer Paternal Grandmother   . Colon polyps Maternal Uncle   . Stomach cancer Neg Hx   . Pancreatic cancer Neg Hx   . Esophageal cancer Neg Hx   . Colon cancer Neg Hx     Review of Systems: Constitutional: +Fatigue Eyes: Doesn't report blurriness of vision  Ears, nose, mouth, throat, and face: Doesn't report sore throat Respiratory: Doesn't report cough, dyspnea or wheezes Cardiovascular: Doesn't report palpitation, chest discomfort  Gastrointestinal:  Doesn't report nausea, constipation, diarrhea GU: Doesn't report incontinence Skin: Doesn't report skin rashes Neurological: Per HPI Musculoskeletal: Doesn't report joint pain Behavioral/Psych: +mood issues, depression  Physical Exam: Vitals:   07/02/20 1221  BP: 123/83  Pulse: 89  Resp: 18  Temp: (!) 97.3 F (36.3 C)  SpO2: 99%   KPS: 90. General: Alert, cooperative, pleasant, in no acute distress Head: Normal EENT: No conjunctival injection or scleral icterus.  Lungs: Resp effort normal Cardiac: Regular rate Abdomen: Non-distended abdomen Skin: No rashes cyanosis or petechiae. Extremities: No clubbing or edema  Neurologic Exam: Mental Status: Awake, alert, attentive to examiner. Oriented to self and environment. Language is fluent with intact comprehension.  Cranial Nerves: Visual acuity is grossly normal. Visual fields are full. Extra-ocular movements intact. No ptosis. Face is symmetric Motor: Tone  and bulk are normal. Power is full in both arms and legs. Reflexes are symmetric, no pathologic reflexes present.  Sensory: Intact to light touch Gait: Normal.   Labs: I have reviewed the data as listed    Component Value Date/Time   NA 138 06/07/2020 0525   NA 135 (A) 05/16/2020 0000   K 4.2 06/07/2020 0525   CL 101 06/07/2020 0525   CO2 25 06/07/2020 0525   GLUCOSE 234 (H) 06/07/2020 0525   BUN 18 06/07/2020 0525   BUN 53 (A) 05/16/2020 0000   CREATININE 0.97 06/07/2020 0525   CALCIUM 8.7 (L) 06/07/2020 0525   PROT 5.2 (L) 06/07/2020 0525   ALBUMIN 2.8 (L) 06/07/2020 0525   AST 17 06/07/2020 0525   ALT 40 06/07/2020 0525   ALKPHOS 76 06/07/2020 0525   BILITOT 0.3 06/07/2020 0525   GFRNONAA >60 06/07/2020 0525   Lab Results  Component Value Date   WBC 4.8 06/07/2020   NEUTROABS 3.4 06/07/2020   HGB 11.4 (L) 06/07/2020   HCT 32.6 (L) 06/07/2020   MCV 86.2 06/07/2020   PLT 148 (L) 06/07/2020    Imaging:  CLINICAL DATA:  Brain mass or lesion. Status post craniotomy for brain mass.  EXAM: MRI HEAD WITHOUT AND WITH CONTRAST  TECHNIQUE: Multiplanar, multiecho pulse sequences of the brain and surrounding structures were obtained without and with intravenous contrast.  CONTRAST:  30mL GADAVIST GADOBUTROL 1 MMOL/ML IV SOLN  COMPARISON:  MRI May 27, 2020.  FINDINGS: Brain: Interval resection of the previously described left cerebellar mass. Evaluation is limited by the iron throughout the left cerebellum from prior Feraheme injection. Post-contrast is imaging is particularly limited within these limitations, no specific evidence of residual tumor. T2 hypointense area in the anterior aspect of the resection cavity is favored to represent an air bubble. There is expected blood products within the resection cavity and expected devitalized tissue. Redemonstrated left cerebellar edema with T1 hyperintensity from Feraheme staining. Decreased mass effect and  decreased effacement of the fourth ventricle. The ventricular system remains mildly dilated, but this is slightly improved from the prior. Mild periventricular edema. No midline shift. Basal cisterns are patent.  Vascular: Major arterial flow voids are maintained at the skull base.  Skull and upper cervical spine: Left occipital craniectomy. Heterogeneous marrow signal without evidence of focal marrow replacing lesion.  Sinuses/Orbits: Mild paranasal sinus mucosal thickening without air-fluid levels. Unremarkable orbits.  Other: Trace bilateral mastoid effusions.  IMPRESSION: 1. 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 Evaluation is limited by  iron throughout the left cerebellum from prior Feraheme injection. Post-contrast is imaging is particularly limited. Within these limitations, no specific evidence of residual tumor. T2 hypointense area in the anterior aspect of the resection cavity is favored to represent an air bubble, but warrants attention on follow. 2. Redemonstrated left cerebellar edema with decreased mass effect and decreased effacement of the fourth ventricle. The ventricular system remains dilated, but this is slightly improved from the prior. Similar mild periventricular edema.   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  Her vertigo, dystaxia, vomiting improved markedly after surgical decompression.  Presently on 2mg  daily decadron.    We extensively reviewed her clinical course, including imaging, pathology, treatment options and prognosis.    We agree with transition to post-operative fractionated radiosurgery to prevent local recurrence.  Treatment plan is complicated by MRI  scan affected by [facility] deposition.  This should resolve in time for next scheduled MRI.   We spent twenty additional minutes teaching regarding the natural history, biology, and historical experience in the treatment of neurologic complications of cancer.   We appreciate the opportunity to participate in the care of Baili Stang.  We ask that Taccara Bushnell return to clinic in 3 months following next brain MRI, or sooner as needed.   All questions were answered. The patient knows to call the clinic with any problems, questions or concerns. No barriers to learning were detected.  The total time spent in the encounter was 45 minutes and more than 50% was on counseling and review of test results   Ventura Sellers, MD Medical Director of Neuro-Oncology [facility] [place] 07/02/20 4:39 PM

## 2020-07-03 ENCOUNTER — Ambulatory Visit
Admission: RE | Admit: 2020-07-03 | Discharge: 2020-07-03 | Disposition: A | Discharge status: Home / Self Care | Payer: PRIVATE HEALTH INSURANCE | Source: Ambulatory Visit | Attending: Radiation Oncology | Admitting: Radiation Oncology

## 2020-07-03 ENCOUNTER — Other Ambulatory Visit: Payer: Self-pay

## 2020-07-03 ENCOUNTER — Telehealth: Payer: Self-pay

## 2020-07-03 ENCOUNTER — Other Ambulatory Visit: Payer: Self-pay | Admitting: Radiation Therapy

## 2020-07-03 DIAGNOSIS — Z510 Encounter for antineoplastic radiation therapy: Secondary | ICD-10-CM | POA: Diagnosis not present

## 2020-07-03 NOTE — Telephone Encounter (Signed)
Spoke with patient in regards to medication for sleep. Advised patient per PA Shona Simpson. Dr Mickeal Skinner will speak her in regards to providing something for sleep. Patient verbalized understanding. TM

## 2020-07-04 ENCOUNTER — Encounter: Payer: Self-pay | Admitting: Radiation Therapy

## 2020-07-04 NOTE — Progress Notes (Signed)
Steroid taper instructions from Dr. Mickeal Skinner were given to patient.            (Currently taking 2mg  daily and scheduled to complete SRS course on Monday 2/7)   --Dex,  cut to 1mg  starting Tuesday 2/8, then formally discontinue dex on 2/15.      Shannon [name] R.T.(R)(T)

## 2020-07-05 ENCOUNTER — Other Ambulatory Visit: Payer: Self-pay

## 2020-07-05 ENCOUNTER — Ambulatory Visit
Admission: RE | Admit: 2020-07-05 | Discharge: 2020-07-05 | Disposition: A | Discharge status: Home / Self Care | Payer: PRIVATE HEALTH INSURANCE | Source: Ambulatory Visit | Attending: Radiation Oncology | Admitting: Radiation Oncology

## 2020-07-05 ENCOUNTER — Inpatient Hospital Stay (HOSPITAL_BASED_OUTPATIENT_CLINIC_OR_DEPARTMENT_OTHER): Payer: PRIVATE HEALTH INSURANCE | Admitting: Internal Medicine

## 2020-07-05 VITALS — BP 119/78 | HR 102 | Temp 97.8°F | Resp 18 | Ht 68.0 in | Wt 198.3 lb

## 2020-07-05 DIAGNOSIS — C7931 Secondary malignant neoplasm of brain: Secondary | ICD-10-CM

## 2020-07-05 DIAGNOSIS — H814 Vertigo of central origin: Secondary | ICD-10-CM

## 2020-07-05 DIAGNOSIS — Z510 Encounter for antineoplastic radiation therapy: Secondary | ICD-10-CM | POA: Diagnosis not present

## 2020-07-05 DIAGNOSIS — G4700 Insomnia, unspecified: Secondary | ICD-10-CM

## 2020-07-05 NOTE — Progress Notes (Signed)
[facility] [place], [place] [address] [phone]   Interval Evaluation  Date of Service: 07/05/20 Patient Name: Shannon [name] Patient MRN: [id number] Patient DOB: 02-01-1966 Provider: Ventura Sellers, MD  Identifying Statement:  Shannon [name] is a 55 y.o. female with Brain metastases [facility]) [C79.31]   Primary Cancer: Colon Adenocarcinoma, Stage IV  Oncologic History: 05/29/20: Sub-occipital craniotomy, resection by Dr. Saintclair Halsted.  Path c/w colon adenocarcinoma 07/08/20: Completes fractionated SRS to resection cavity w/ Dr. Isidore Moos  Interval History:  Shannon [name] presents today for follow up to address complaint of sleep impairment.  She describes significant insomnia, difficulty falling asleep.  This problem has been ongoing for years, but worsened lately with cancer diagnosis, brain surgery and radiation.  At times she will lay in bed all night, until 5am before finally sleeping; when she is asleep she usually gets at least 8 hours without awakening.  Has been dosing seroquel 800mg  nightly through her outpatient psychiatrist, this has been effective at keeping her in sleeping state, but does cause some daytimes drowsiness.  In addition, she doses 300mg  of fluvoxamine.      H+P (07/02/20) Patient presented to medical attention at the end of December, 2021 with several weeks of progressive balance impairment.  She worsened toward the end to the point of needing full assist with walking, or being mostly bed-bound.  At the same time she developed bouts of vertigo, described as "room spinning around me", for which medical interventions were not effective.  CNS imaging demonstrated large enhancing mass in left cerebellar hemisphere with swelling and compression c/w likely brain metastasis; she underwent craniotomy and resection on 05/29/20 with Dr. Saintclair Halsted.  Following surgery, her symptoms improved considerably.  At  present, she is functionally intact aside from issues with short term memory, fatigue, and mood.  Visited with radiation oncology today for post-operative simulation.   Medications: Current Outpatient Medications on File Prior to Visit  Medication Sig Dispense Refill  . acetaminophen (TYLENOL) 325 MG tablet Take 2 tablets (650 mg total) by mouth every 6 (six) hours as needed for mild pain.    Marland Kitchen atorvastatin (LIPITOR) 10 MG tablet Take 1 tablet (10 mg total) by mouth at bedtime. 30 tablet 0  . dexamethasone (DECADRON) 2 MG tablet Take 1 tablet (2 mg total) by mouth daily. 30 tablet 0  . Dulaglutide (TRULICITY) 1.5 0000000 SOPN Inject into the skin.    . fluvoxaMINE (LUVOX) 100 MG tablet Take 3 tablets (300 mg total) by mouth at bedtime. 270 tablet 1  . hyoscyamine (LEVSIN SL) 0.125 MG SL tablet Place 1 tablet (0.125 mg total) under the tongue every 6 (six) hours as needed. 45 tablet 1  . insulin aspart (NOVOLOG FLEXPEN) 100 UNIT/ML FlexPen Inject 8 Units into the skin 3 (three) times daily with meals. 15 mL 11  . insulin glargine (LANTUS) 100 UNIT/ML Solostar Pen Inject 26 Units into the skin daily. 15 mL 11  . lamoTRIgine (LAMICTAL) 150 MG tablet Take 1 tablet (150 mg total) by mouth daily. 90 tablet 3  . levothyroxine (SYNTHROID) 75 MCG tablet Take 1 tablet (75 mcg total) by mouth daily. 30 tablet 0  . metFORMIN (GLUCOPHAGE) 500 MG tablet Take 1 tablet (500 mg total) by mouth 2 (two) times daily with a meal. 60 tablet 0  . NOVOFINE PEN NEEDLE 32G X 6 MM MISC SMARTSIG:Syringe(s) SUB-Q    . pantoprazole (PROTONIX) 40 MG  tablet Take 1 tablet (40 mg total) by mouth at bedtime. 30 tablet 0  . QUEtiapine (SEROQUEL) 400 MG tablet Take 2 tablets (800 mg total) by mouth at bedtime. 30 tablet 0  . topiramate (TOPAMAX) 50 MG tablet Take 1 tablet (50 mg total) by mouth 2 (two) times daily. 60 tablet 0  . HYDROmorphone (DILAUDID) 2 MG tablet Take 1 tablet (2 mg total) by mouth at bedtime as needed for  severe pain (severe headache). (Patient not taking: No sig reported) 7 tablet 0  . oxyCODONE (OXY IR/ROXICODONE) 5 MG immediate release tablet Take 1-2 tablets (5-10 mg total) by mouth every 4 (four) hours as needed (moderate pain 5mg , severe pain 10mg ). (Patient not taking: No sig reported) 30 tablet 0  . senna-docusate (SENOKOT-S) 8.6-50 MG tablet Take 2 tablets by mouth at bedtime. (Patient not taking: No sig reported)     No current facility-administered medications on file prior to visit.    Allergies:  Allergies  Allergen Reactions  . Clindamycin/Lincomycin Rash  . Penicillins Rash    Reaction: 10 years   Past Medical History:  Past Medical History:  Diagnosis Date  . Anemia    Iron De  . Anxiety   . Blood clot in vein   . Bowel obstruction ([place])   . Colon cancer ([place]) 2018   treated with surgery and chemotherapy  . Depression   . Diabetes mellitus without complication ([place])    Type II  . DVT (deep venous thrombosis) ([place]) 2019   behind right knee- while she wasa on chemo  . GERD (gastroesophageal reflux disease)   . History of blood transfusion   . History of chemotherapy   . History of kidney stones 2012   passed  . Hypothyroidism   . Obesity    Past Surgical History:  Past Surgical History:  Procedure Laterality Date  . ABDOMINAL HYSTERECTOMY  05/2019   total laparoscopin with tubes and ovariers removed also.  . APPENDECTOMY  05/2019  . COLON SURGERY  03/2017   Colon resection  . COLONOSCOPY  2019  . SUBOCCIPITAL CRANIECTOMY CERVICAL LAMINECTOMY Left 05/29/2020   Procedure: Suboccipital Craniectomy for Resection of Left Cerebellar Mass;  Surgeon: Kary Kos, MD;  Location: [facility];  Service: Neurosurgery;  Laterality: Left;   Social History:  Social History   Socioeconomic History  . Marital status: Widowed    Spouse name: Not on file  . Number of children: Not on file  . Years of education: Not on file  . Highest education level: Not on file   Occupational History  . Occupation: care coordinator   Tobacco Use  . Smoking status: Never Smoker  . Smokeless tobacco: Never Used  Vaping Use  . Vaping Use: Never used  Substance and Sexual Activity  . Alcohol use: Yes    Comment: rare  . Drug use: Never  . Sexual activity: Not Currently  Other Topics Concern  . Not on file  Social History Narrative  . Not on file   Social Determinants of Health   Financial Resource Strain: Not on file  Food Insecurity: Not on file  Transportation Needs: Not on file  Physical Activity: Not on file  Stress: Not on file  Social Connections: Not on file  Intimate Partner Violence: Not on file   Family History:  Family History  Problem Relation Age of Onset  . Irritable bowel syndrome Mother   . Colon polyps Father   . Breast cancer Maternal Grandmother   .  Diabetes Maternal Grandmother   . Diabetes Maternal Grandfather   . Breast cancer Paternal Grandmother   . Colon polyps Maternal Uncle   . Stomach cancer Neg Hx   . Pancreatic cancer Neg Hx   . Esophageal cancer Neg Hx   . Colon cancer Neg Hx     Review of Systems: Constitutional: +Fatigue Eyes: Doesn't report blurriness of vision Ears, nose, mouth, throat, and face: Doesn't report sore throat Respiratory: Doesn't report cough, dyspnea or wheezes Cardiovascular: Doesn't report palpitation, chest discomfort  Gastrointestinal:  Doesn't report nausea, constipation, diarrhea GU: Doesn't report incontinence Skin: Doesn't report skin rashes Neurological: Per HPI Musculoskeletal: Doesn't report joint pain Behavioral/Psych: +mood issues, depression  Physical Exam: Vitals:   07/05/20 1200  BP: 119/78  Pulse: (!) 102  Resp: 18  Temp: 97.8 F (36.6 C)  SpO2: 97%   KPS: 90. General: Alert, cooperative, pleasant, in no acute distress Head: Normal EENT: No conjunctival injection or scleral icterus.  Lungs: Resp effort normal Cardiac: Regular rate Abdomen: Non-distended  abdomen Skin: No rashes cyanosis or petechiae. Extremities: No clubbing or edema  Neurologic Exam: Mental Status: Awake, alert, attentive to examiner. Oriented to self and environment. Language is fluent with intact comprehension.  Cranial Nerves: Visual acuity is grossly normal. Visual fields are full. Extra-ocular movements intact. No ptosis. Face is symmetric Motor: Tone and bulk are normal. Power is full in both arms and legs. Reflexes are symmetric, no pathologic reflexes present.  Sensory: Intact to light touch Gait: Normal.   Labs: I have reviewed the data as listed    Component Value Date/Time   NA 138 06/07/2020 0525   NA 135 (A) 05/16/2020 0000   K 4.2 06/07/2020 0525   CL 101 06/07/2020 0525   CO2 25 06/07/2020 0525   GLUCOSE 234 (H) 06/07/2020 0525   BUN 18 06/07/2020 0525   BUN 53 (A) 05/16/2020 0000   CREATININE 0.97 06/07/2020 0525   CALCIUM 8.7 (L) 06/07/2020 0525   PROT 5.2 (L) 06/07/2020 0525   ALBUMIN 2.8 (L) 06/07/2020 0525   AST 17 06/07/2020 0525   ALT 40 06/07/2020 0525   ALKPHOS 76 06/07/2020 0525   BILITOT 0.3 06/07/2020 0525   GFRNONAA >60 06/07/2020 0525   Lab Results  Component Value Date   WBC 4.8 06/07/2020   NEUTROABS 3.4 06/07/2020   HGB 11.4 (L) 06/07/2020   HCT 32.6 (L) 06/07/2020   MCV 86.2 06/07/2020   PLT 148 (L) 06/07/2020    Assessment/Plan Brain metastases ([place]) [C79.31]  Insomnia  Shannon [name] presents today with acute on chronic insomnia.  Though this is a longstanding and previously treated issue, circumstances have evolved with consideration given to need for brain tumor resection, brain radiation, and corticosteroids.  We provided counseling on basic sleep hygeine, per CDC guidelines.    We also recommended and discussed mindfulness practices which can be helpful in sleep related disorders.    At this time she is heavily pre-treated with sedating medications, and we would not recommend adding additional  pharmacologic agent.  She is agreeable with this and understanding.  She also understands that insomnia can improve as corticosteroid dose is gradually weaned down.  We appreciate the opportunity to participate in the care of Shannon [name].  We ask that Shannon [name] return to clinic after next MRI, as scheduled, or sooner if needed.  All questions were answered. The patient knows to call the clinic with any problems, questions or concerns.  No barriers to learning were detected.  The total time spent in the encounter was 30 minutes and more than 50% was on counseling and review of test results   Ventura Sellers, MD Medical Director of Neuro-Oncology [facility] at [place] 07/05/20 12:16 PM

## 2020-07-08 ENCOUNTER — Other Ambulatory Visit: Payer: Self-pay

## 2020-07-08 ENCOUNTER — Other Ambulatory Visit: Payer: Self-pay | Admitting: Internal Medicine

## 2020-07-08 ENCOUNTER — Encounter: Payer: Self-pay | Admitting: Radiation Oncology

## 2020-07-08 ENCOUNTER — Ambulatory Visit
Admission: RE | Admit: 2020-07-08 | Discharge: 2020-07-08 | Disposition: A | Discharge status: Home / Self Care | Payer: PRIVATE HEALTH INSURANCE | Source: Ambulatory Visit | Attending: Radiation Oncology | Admitting: Radiation Oncology

## 2020-07-08 DIAGNOSIS — Z510 Encounter for antineoplastic radiation therapy: Secondary | ICD-10-CM | POA: Diagnosis not present

## 2020-07-08 MED ORDER — PANTOPRAZOLE SODIUM 40 MG PO TBEC: 40.0000 mg | DELAYED_RELEASE_TABLET | Freq: Every day | ORAL | 0 refills | Status: DC

## 2020-07-09 ENCOUNTER — Telehealth: Payer: Self-pay | Admitting: *Deleted

## 2020-07-09 NOTE — Telephone Encounter (Signed)
If she has no headaches then she does not have to keep taking the Topamax

## 2020-07-09 NOTE — Telephone Encounter (Signed)
Ms Lentsch called about her Topamax that was prescribed at discharge by [facility]. She is uncertain if she is to continue taking--if so she will need a refill.  She is having "almost no headaches now" and just using tylenol. Please advise.

## 2020-07-09 NOTE — Telephone Encounter (Signed)
Notified. 

## 2020-07-09 NOTE — Progress Notes (Signed)
  Patient Name: Shannon [name] MRN: [id number] DOB: Sep 28, 1965 Referring Physician: Cecil Cobbs Date of Service: 07/08/2020  [facility] [place]                                                        End Of Treatment Note  Diagnoses: C79.31-Secondary malignant neoplasm of brain  Cancer Staging: Recurrent metastatic stage III colon cancer now with solitary brain metastasis  Intent: Palliative  Radiation Treatment Dates: 07/03/2020 through 07/08/2020 Postop SRS: Site Technique Total Dose (Gy) Dose per Fx (Gy) Completed Fx Beam Energies  Brain: Brain Postop PTV1 Lt Cerebellum 44 mm IMRT 27/27 9 3/3 6XFFF   Narrative: The patient tolerated radiation therapy relatively well. She did have some   Plan: The patient will receive a call in about one month from the radiation oncology department. She will continue follow up with Dr. Saintclair Halsted as Dr. Mickeal Skinner in neuro oncology for her long term follow up. She will receive a call for scheduling an MRI for surveillance in 3 months time as well from the brain oncology navigator. She will also continue to follow up with Dr. Bobby Rumpf in Medical Oncology in [place] for ongoing oncologic care.  ________________________________________________    Carola Rhine, PAC

## 2020-07-10 NOTE — Procedures (Signed)
  Name: Shannon [name]  MRN: [id number]  Date: 07/03/2020   DOB: 1965/09/07  Stereotactic Radiosurgery Operative Note  PRE-OPERATIVE DIAGNOSIS:  Solitary Brain Metastasis  POST-OPERATIVE DIAGNOSIS:  Solitary Brain Metastasis  PROCEDURE:  Stereotactic Radiosurgery  SURGEON:  Elaina Hoops, MD  NARRATIVE: The patient underwent a radiation treatment planning session in the radiation oncology simulation suite under the care of the radiation oncology physician and physicist.  I participated closely in the radiation treatment planning afterwards. The patient underwent planning CT which was fused to 3T high resolution MRI with 1 mm axial slices.  These images were fused on the planning system.  We contoured the gross target volumes and subsequently expanded this to yield the Planning Target Volume. I actively participated in the planning process.  I helped to define and review the target contours and also the contours of the optic pathway, eyes, brainstem and selected nearby organs at risk.  All the dose constraints for critical structures were reviewed and compared to AAPM Task Group 101.  The prescription dose conformity was reviewed.  I approved the plan electronically.    Accordingly, Shannon [name] was brought to the TrueBeam stereotactic radiation treatment linac and placed in the custom immobilization mask.  The patient was aligned according to the IR fiducial markers with BrainLab Exactrac, then orthogonal x-rays were used in ExacTrac with the 6DOF robotic table and the shifts were made to align the patient  Shannon [name] received stereotactic radiosurgery uneventfully.    The detailed description of the procedure is recorded in the radiation oncology procedure note.  I was present for the duration of the procedure.  DISPOSITION:  Following delivery, the patient was transported to nursing in stable condition and monitored for possible acute effects to be discharged to  home in stable condition with follow-up in one month.  Elaina Hoops, MD 07/10/2020 5:59 PM

## 2020-07-11 ENCOUNTER — Other Ambulatory Visit: Payer: Self-pay

## 2020-07-11 ENCOUNTER — Telehealth: Payer: Self-pay

## 2020-07-11 MED ORDER — TOPIRAMATE 50 MG PO TABS: 50.0000 mg | ORAL_TABLET | Freq: Two times a day (BID) | ORAL | 0 refills | Status: DC

## 2020-07-11 NOTE — Telephone Encounter (Addendum)
Shannon [name] called back. She has requested Topamax refill.   Please advise or refill. Thank you.  Call back phone [phone].

## 2020-07-12 NOTE — Telephone Encounter (Signed)
Task completed

## 2020-07-15 ENCOUNTER — Ambulatory Visit: Payer: PRIVATE HEALTH INSURANCE | Admitting: Internal Medicine

## 2020-07-16 ENCOUNTER — Other Ambulatory Visit: Payer: Self-pay | Admitting: Hematology and Oncology

## 2020-07-16 ENCOUNTER — Inpatient Hospital Stay: Payer: PRIVATE HEALTH INSURANCE

## 2020-07-16 ENCOUNTER — Other Ambulatory Visit: Payer: Self-pay | Admitting: Oncology

## 2020-07-16 ENCOUNTER — Other Ambulatory Visit: Payer: Self-pay

## 2020-07-16 ENCOUNTER — Inpatient Hospital Stay (INDEPENDENT_AMBULATORY_CARE_PROVIDER_SITE_OTHER): Payer: PRIVATE HEALTH INSURANCE | Admitting: Oncology

## 2020-07-16 DIAGNOSIS — Z79899 Other long term (current) drug therapy: Secondary | ICD-10-CM | POA: Diagnosis not present

## 2020-07-16 DIAGNOSIS — Z7984 Long term (current) use of oral hypoglycemic drugs: Secondary | ICD-10-CM | POA: Diagnosis not present

## 2020-07-16 DIAGNOSIS — D508 Other iron deficiency anemias: Secondary | ICD-10-CM | POA: Diagnosis not present

## 2020-07-16 DIAGNOSIS — C7931 Secondary malignant neoplasm of brain: Secondary | ICD-10-CM | POA: Diagnosis present

## 2020-07-16 DIAGNOSIS — R5383 Other fatigue: Secondary | ICD-10-CM | POA: Diagnosis not present

## 2020-07-16 DIAGNOSIS — C189 Malignant neoplasm of colon, unspecified: Secondary | ICD-10-CM | POA: Diagnosis not present

## 2020-07-16 DIAGNOSIS — Q043 Other reduction deformities of brain: Secondary | ICD-10-CM | POA: Diagnosis not present

## 2020-07-16 DIAGNOSIS — D509 Iron deficiency anemia, unspecified: Secondary | ICD-10-CM

## 2020-07-16 DIAGNOSIS — Z85038 Personal history of other malignant neoplasm of large intestine: Secondary | ICD-10-CM | POA: Diagnosis present

## 2020-07-16 DIAGNOSIS — G9389 Other specified disorders of brain: Secondary | ICD-10-CM

## 2020-07-16 DIAGNOSIS — C182 Malignant neoplasm of ascending colon: Secondary | ICD-10-CM

## 2020-07-16 DIAGNOSIS — Z794 Long term (current) use of insulin: Secondary | ICD-10-CM | POA: Diagnosis not present

## 2020-07-16 LAB — CBC AND DIFFERENTIAL
HCT: 37 (ref 36–46)
Hemoglobin: 12.4 (ref 12.0–16.0)
Neutrophils Absolute: 5.11
Platelets: 161 (ref 150–399)
WBC: 7

## 2020-07-16 LAB — BASIC METABOLIC PANEL
BUN: 17 (ref 4–21)
CO2: 21 (ref 13–22)
Chloride: 107 (ref 99–108)
Creatinine: 1.1 (ref 0.5–1.1)
Glucose: 226
Potassium: 3.4 (ref 3.4–5.3)
Sodium: 139 (ref 137–147)

## 2020-07-16 LAB — CBC
MCV: 92 (ref 81–99)
RBC: 4.02 (ref 3.87–5.11)

## 2020-07-16 LAB — COMPREHENSIVE METABOLIC PANEL
Albumin: 3.9 (ref 3.5–5.0)
Calcium: 8.8 (ref 8.7–10.7)

## 2020-07-16 LAB — HEPATIC FUNCTION PANEL
ALT: 31 (ref 7–35)
AST: 18 (ref 13–35)
Alkaline Phosphatase: 111 (ref 25–125)
Bilirubin, Total: 0.3

## 2020-07-16 NOTE — Progress Notes (Incomplete)
[facility]  [address] [place],  [place]  [address] [phone]  Clinic Day:  07/16/2020  Referring physician: No ref. provider found   HISTORY OF PRESENT ILLNESS:  The patient is a 55 y.o. female who was recently found to have CNS recurrence of stage IIIC (T4b N1 M0) colon cancer.  He underwent a left cerebellar metastatectomy in late December 2021, whose pathology was consistent with her colon cancer.  MMR testing came back normal.  Of note, the patient underwent 3 stereotactic radiation treatments, with the last dose given on February 7th.  She comes in today to re-establish her colon cancer surveillance.  Overall, the patient has been doing well.  She has very minimal balance problems since her surgery was performed.  She denies having diarrhea of other significant GI symptoms that concern her for recurrent colon cancer being elsewhere.    With respect to her colon cancer history, she is status post a right hemicolectomy in early October 2018.  She completed 12 cycles of adjuvant FOLFOX chemotherapy in April 2019.  Scans done since then had not shown any evidence of disease recurrence until her recent brain scans showed her metastatic left cerebellar lesion.  On another note, previous studies have shown this patient have 30% lifetime chance of developing breast cancer.  A recent annual mammogram showed no evidence of any disease.    PHYSICAL EXAM:  Last menstrual period 02/22/2019. Wt Readings from Last 3 Encounters:  07/16/20 203 lb 6.4 oz (92.3 kg)  07/05/20 198 lb 4.8 oz (89.9 kg)  07/02/20 199 lb 3.2 oz (90.4 kg)   There is no height or weight on file to calculate BMI. Performance status (ECOG): 1 Physical Exam Constitutional:      Appearance: Normal appearance. She is not ill-appearing.  HENT:     Mouth/Throat:     Mouth: Mucous membranes are moist.     Pharynx: Oropharynx is clear. No oropharyngeal exudate or posterior oropharyngeal  erythema.  Cardiovascular:     Rate and Rhythm: Normal rate and regular rhythm.     Heart sounds: No murmur heard. No friction rub. No gallop.   Pulmonary:     Effort: Pulmonary effort is normal. No respiratory distress.     Breath sounds: Normal breath sounds. No wheezing, rhonchi or rales.  Chest:  Breasts:     Right: No axillary adenopathy or supraclavicular adenopathy.     Left: No axillary adenopathy or supraclavicular adenopathy.    Abdominal:     General: Bowel sounds are normal. There is no distension.     Palpations: Abdomen is soft. There is no mass.     Tenderness: There is no abdominal tenderness.  Musculoskeletal:        General: No swelling.     Right lower leg: No edema.     Left lower leg: No edema.  Lymphadenopathy:     Cervical: No cervical adenopathy.     Upper Body:     Right upper body: No supraclavicular or axillary adenopathy.     Left upper body: No supraclavicular or axillary adenopathy.     Lower Body: No right inguinal adenopathy. No left inguinal adenopathy.  Skin:    General: Skin is warm.     Coloration: Skin is not jaundiced.     Findings: No lesion or rash.  Neurological:     General: No focal deficit present.     Mental Status: She is alert and oriented to person, place,  and time. Mental status is at baseline.     Cranial Nerves: Cranial nerves are intact.  Psychiatric:        Mood and Affect: Mood normal.        Behavior: Behavior normal.        Thought Content: Thought content normal.     LABS:   CBC Latest Ref Rng & Units 07/16/2020 06/07/2020 05/29/2020  WBC - 7.0 4.8 7.6  Hemoglobin 12.0 - 16.0 12.4 11.4(L) 12.0  Hematocrit 36 - 46 37 32.6(L) 35.0(L)  Platelets 150 - 399 161 148(L) 120(L)    ASSESSMENT & PLAN:  Assessment/Plan:  A 55 y.o. female with CNS recurrence of stage IIIC (T4b N1 M0) colon cancer, status post a left cerebellar metastatecomy in December 2021.  She recently completed stereotactic radiation this month.   Clinically, she is doing extremely well, especially when considering 18, followed by 12 cycles of adjuvant chemotherapy, which were completed in April 2019.  Although her exam was normal, the risk rise in her CEA level since her last visit has me concerned.  Based upon this, I will arrange for her to undergo a PET scan in the forthcoming weeks to rule out occult sites of disease metastasis.  I will see her back the following day to go over her PET scan results and their implications.  Also, of note, despite her normal hemoglobin, her iron parameters are still on the low side.  Based upon this, I will arrange for her to receive IV Feraheme 1020 mg over these next few weeks. The patient understands all the plans discussed today and is in agreement with them.      Brinden Kincheloe Macarthur Critchley, MD

## 2020-07-17 LAB — CEA: CEA: 22.8 ng/mL — ABNORMAL HIGH (ref 0.0–4.7)

## 2020-07-18 DIAGNOSIS — Z6831 Body mass index (BMI) 31.0-31.9, adult: Secondary | ICD-10-CM | POA: Insufficient documentation

## 2020-07-23 ENCOUNTER — Telehealth: Payer: Self-pay | Admitting: Oncology

## 2020-07-23 DIAGNOSIS — C189 Malignant neoplasm of colon, unspecified: Secondary | ICD-10-CM | POA: Insufficient documentation

## 2020-07-23 NOTE — Telephone Encounter (Signed)
07/23/20 Spoke with patient and sched CT SCANS

## 2020-07-23 NOTE — Progress Notes (Signed)
[facility]  [address] [place],  [place]  [address] [phone]  Clinic Day:  07/16/2020  Referring physician: Greig Right, MD   HISTORY OF PRESENT ILLNESS:  The patient is a 55 y.o. female who unfortunately developed left cerebellar recurrence of her stage IIIC (T4b N1 M0) colon cancer.  In December 2021, she underwent a left cerebellar metastatectomy, whose pathology was consistent with metastatic colon cancer.  Tumor testing did come back MMR normal.  Since undergoing this surgery, her strength and stamina have essentially returned to her baseline.  She comes in today to re-establish her colon cancer surveillance.  She denies having any current neurological symptoms which suggest persistent CNS disease.    With respect to her colon cancer history, she is status post a right hemicolectomy in early October 2018, followed by 12 cycles of adjuvant chemotherapy, which were completed in April 2019.   PHYSICAL EXAM:  Blood pressure 124/75, pulse (!) 102, temperature 98.1 F (36.7 C), resp. rate 14, height 5' 8"  (1.727 m), weight 203 lb 6.4 oz (92.3 kg), last menstrual period 02/22/2019, SpO2 94 %. Wt Readings from Last 3 Encounters:  07/16/20 203 lb 6.4 oz (92.3 kg)  07/05/20 198 lb 4.8 oz (89.9 kg)  07/02/20 199 lb 3.2 oz (90.4 kg)   Body mass index is 30.93 kg/m. Performance status (ECOG): 0 - Asymptomatic Physical Exam Constitutional:      Appearance: Normal appearance. She is not ill-appearing.  HENT:     Mouth/Throat:     Mouth: Mucous membranes are moist.     Pharynx: Oropharynx is clear. No oropharyngeal exudate or posterior oropharyngeal erythema.  Cardiovascular:     Rate and Rhythm: Normal rate and regular rhythm.     Heart sounds: No murmur heard. No friction rub. No gallop.   Pulmonary:     Effort: Pulmonary effort is normal. No respiratory distress.     Breath sounds: Normal breath sounds. No wheezing, rhonchi or rales.   Chest:  Breasts:     Right: No axillary adenopathy or supraclavicular adenopathy.     Left: No axillary adenopathy or supraclavicular adenopathy.    Abdominal:     General: Bowel sounds are normal. There is no distension.     Palpations: Abdomen is soft. There is no mass.     Tenderness: There is no abdominal tenderness.  Musculoskeletal:        General: No swelling.     Right lower leg: No edema.     Left lower leg: No edema.  Lymphadenopathy:     Cervical: No cervical adenopathy.     Upper Body:     Right upper body: No supraclavicular or axillary adenopathy.     Left upper body: No supraclavicular or axillary adenopathy.     Lower Body: No right inguinal adenopathy. No left inguinal adenopathy.  Skin:    General: Skin is warm.     Coloration: Skin is not jaundiced.     Findings: No lesion or rash.  Neurological:     General: No focal deficit present.     Mental Status: She is alert and oriented to person, place, and time. Mental status is at baseline.     Cranial Nerves: Cranial nerves are intact.  Psychiatric:        Mood and Affect: Mood normal.        Behavior: Behavior normal.        Thought Content: Thought content normal.  LABS:   CBC Latest Ref Rng & Units 07/16/2020 06/07/2020 05/29/2020  WBC - 7.0 4.8 7.6  Hemoglobin 12.0 - 16.0 12.4 11.4(L) 12.0  Hematocrit 36 - 46 37 32.6(L) 35.0(L)  Platelets 150 - 399 161 148(L) 120(L)    Ref. Range 07/16/2020 00:00  Sodium Latest Ref Range: 137 - 147  139  Potassium Latest Ref Range: 3.4 - 5.3  3.4  Chloride Latest Ref Range: 99 - 108  107  CO2 Latest Ref Range: 13 - 22  21  Glucose Unknown 226  BUN Latest Ref Range: 4 - 21  17  Creatinine Latest Ref Range: 0.5 - 1.1  1.1  Calcium Latest Ref Range: 8.7 - 10.7  8.8  Alkaline Phosphatase Latest Ref Range: 25 - 125  111  Albumin Latest Ref Range: 3.5 - 5.0  3.9  AST Latest Ref Range: 13 - 35  18  ALT Latest Ref Range: 7 - 35  31  Bilirubin, Total Unknown 0.3     Ref. Range 07/16/2020 14:44  CEA Latest Ref Range: 0.0 - 4.7 ng/mL 22.8 (H)   ASSESSMENT & PLAN:  Assessment/Plan:  A 55 y.o. female with CNS recurrence of her stage IIIC (T4b N1 M0) colon cancer, status post a left metastatectomy in December 2021.  Clinically, the patient is doing much better.  However, I am very concerned as her CEA is even higher now than what it was before her brain surgery was performed in December 2021.  Based upon this, I will arrange for her to undergo CT scans of her chest, abdomen and pelvis next week to rule out other sites of occult disease metastasis.  I will see her back the following day to go over her CT scan images and their implications.  The patient understands all the plans discussed today and is in agreement with them.    Norva Bowe Macarthur Critchley, MD

## 2020-07-25 NOTE — Progress Notes (Signed)
[facility]  [address] [place],  [place]  [address] [phone]  Clinic Day:  07/26/2020  Referring physician: Greig Right, MD   HISTORY OF PRESENT ILLNESS:  The patient is a 55 y.o. female who unfortunately developed left cerebellar recurrence of her stage IIIC (T4b N1 M0) colon cancer.  This lesion has since been resected.  She comes in today to over CT scans of her chest, abdomen, and pelvis to determine if she has other areas of metastatic disease.  Since her last visit, she has been doing okay.  She denies having any new symptoms or findings that concern her for disease progression.  With respect to her colon cancer history, she is status post a right hemicolectomy in early October 2018, followed by 12 cycles of adjuvant chemotherapy, which were completed in April 2019.  In December 2021, she underwent a left cerebellar metastatectomy, whose pathology was consistent with metastatic colon cancer.  Tumor testing did come back MMR normal.  Since undergoing this surgery, her strength and stamina have essentially returned to her baseline.    PHYSICAL EXAM:  Blood pressure 117/73, pulse (!) 107, temperature 98.6 F (37 C), resp. rate 16, height 5' 8"  (1.727 m), weight 201 lb 3.2 oz (91.3 kg), last menstrual period 02/22/2019, SpO2 97 %. Wt Readings from Last 3 Encounters:  07/29/20 204 lb (92.5 kg)  07/26/20 201 lb 3.2 oz (91.3 kg)  07/16/20 203 lb 6.4 oz (92.3 kg)   Body mass index is 30.59 kg/m. Performance status (ECOG): 0 - Asymptomatic Physical Exam Constitutional:      Appearance: Normal appearance. She is not ill-appearing.  HENT:     Mouth/Throat:     Mouth: Mucous membranes are moist.     Pharynx: Oropharynx is clear. No oropharyngeal exudate or posterior oropharyngeal erythema.  Cardiovascular:     Rate and Rhythm: Normal rate and regular rhythm.     Heart sounds: No murmur heard. No friction rub. No gallop.   Pulmonary:      Effort: Pulmonary effort is normal. No respiratory distress.     Breath sounds: Normal breath sounds. No wheezing, rhonchi or rales.  Chest:  Breasts:     Right: No axillary adenopathy or supraclavicular adenopathy.     Left: No axillary adenopathy or supraclavicular adenopathy.    Abdominal:     General: Bowel sounds are normal. There is no distension.     Palpations: Abdomen is soft. There is no mass.     Tenderness: There is no abdominal tenderness.  Musculoskeletal:        General: No swelling.     Right lower leg: No edema.     Left lower leg: No edema.  Lymphadenopathy:     Cervical: No cervical adenopathy.     Upper Body:     Right upper body: No supraclavicular or axillary adenopathy.     Left upper body: No supraclavicular or axillary adenopathy.     Lower Body: No right inguinal adenopathy. No left inguinal adenopathy.  Skin:    General: Skin is warm.     Coloration: Skin is not jaundiced.     Findings: No lesion or rash.  Neurological:     General: No focal deficit present.     Mental Status: She is alert and oriented to person, place, and time. Mental status is at baseline.     Cranial Nerves: Cranial nerves are intact.  Psychiatric:        Mood  and Affect: Mood normal.        Behavior: Behavior normal.        Thought Content: Thought content normal.   SCANS:  CT scans of her chest/abdomen/pelvis revealed the following: FINDINGS: CT CHEST FINDINGS  Cardiovascular: Right chest wall port with tip in the distal SVC. Normal size heart. No significant pericardial effusion/thickening. No thoracic aortic aneurysm. No central pulmonary embolus..  Mediastinum/Nodes: Visualized portion of thyroid is unremarkable. No pathologically enlarged mediastinal, hilar or axillary lymph nodes. Esophagus and trachea are grossly unremarkable.  Lungs/Pleura: There are multiple right-sided pulmonary nodules without least the right lower lobe nodule being new in comparison to prior  imaging. For reference:  -Solid lobular 2.2 cm RIGHT upper lobe pulmonary nodule on image 37/4.  -Solid 8 mm RIGHT upper lobe pulmonary nodule on image 38/4.  -Solid RIGHT lower lobe pulmonary nodule which measures 1.4 cm on image 71/4  -Solid 11m LEFT upper lobe pulmonary nodule on image 23/4.  Musculoskeletal: Thoracic diffuse idiopathic skeletal hyperostosis.. No suspicious osseous lesions.  CT ABDOMEN PELVIS FINDINGS  Hepatobiliary: There is a 9 mm hypodense ill-defined area within segment IV a of the liver which is smaller but in the same location as the previous area of focal fatty infiltration on image 55/2. No new suspicious hepatic lesions. Gallbladder is unremarkable. No biliary ductal dilation..  Pancreas: Unremarkable.  Spleen: Unremarkable.  Adrenals/Urinary Tract: New 1.8 x 0.8 cm right adrenal lesion on image 53/2. Left adrenal gland is unremarkable. Hypoattenuating 7 mm right upper pole renal lesion which is technically too small to accurately characterize but most likely a renal cyst. No filling defect visualized in the opacified portions of the collecting systems and ureter on delayed imaging.  Stomach/Bowel: Stomach is unremarkable. Normal positioning of the duodenum/ligament of Treitz. No small bowel dilation. Prior right hemicolectomy. No suspicious mass like colonic lesions visualized. There is long segment wall thickening of the descending and sigmoid colon likely related to under distension...  Vascular/Lymphatic: Within the right upper quadrant mesentery there is a 2.8 by 2.6 x 1.7 cm mesenteric mass on image 72/2 and 33/61. Additionally, there are multiple prominent right upper quadrant mesenteric nodules which are new and or enlarged in comparison to prior for instance on image 80/2. No pathologically enlarged retroperitoneal or pelvic lymph nodes.  Reproductive: Status post hysterectomy. No adnexal masses.  Other: Surgical change in the  anterior abdominal wall. Small fat containing ventral hernia.  Musculoskeletal: No acute or significant osseous findings.  IMPRESSION: 1. New right upper quadrant mesenteric mass, which is consistent with local recurrence/metastatic deposit. 2. Multiple additional right upper quadrant mesenteric nodules which are new and or enlarged in comparison to prior imaging representing lymph nodes or peritoneal implants, but also favored to represent disease involvement. 3. Multiple pulmonary nodules, without least the right lower lobe nodule being new in comparison to prior imaging, consistent with metastatic disease. 4. New 1.8 cm right adrenal lesion, which in the setting of other metastases is consistent with metastatic disease. 5. There is a 9 mm hypodense ill-defined area within segment IV a of the liver which is smaller but in the same location as the previous area of focal fatty infiltration. Attention on follow-up studies is recommended. 6. Long segment wall thickening of the descending and sigmoid colon likely related to under distension.  LABS:    Ref. Range 07/16/2020 14:44  CEA Latest Ref Range: 0.0 - 4.7 ng/mL 22.8 (H)   ASSESSMENT & PLAN:  Assessment/Plan:  A 55y.o.  female with metastatic colon cancer.  In clinic today, I went over all of CT scans with her, for whch she could see that her cancer has unfortunately spread to multiples areas, including her abdominal cavity, lungs, and right adrenal gland.  She now understands that she is ultimately dealing with incurable disease.  Moving forward, the goal will be to give her the best possible palliative chemotherapy to keep her disease under control for as long as possible.  I will place her on FOLFIRI/Avastin, with the first dose to be given next week.  She was made aware of the side effects which can go along with this chemotherapy regimen, including diarrhea, alopecia and fatigue.  Each cycle of chemotherapy will be repeated every  2 weeks.  I will see her back in 3 weeks before she heads into her 2nd cycle of FOLFIRI/Avastin.  The patient understands all the plans discussed today and is in agreement with them.    Othello Sgroi Macarthur Critchley, MD

## 2020-07-26 ENCOUNTER — Telehealth: Payer: Self-pay | Admitting: Oncology

## 2020-07-26 ENCOUNTER — Other Ambulatory Visit: Payer: Self-pay

## 2020-07-26 ENCOUNTER — Inpatient Hospital Stay (INDEPENDENT_AMBULATORY_CARE_PROVIDER_SITE_OTHER): Payer: PRIVATE HEALTH INSURANCE | Admitting: Oncology

## 2020-07-26 VITALS — BP 117/73 | HR 107 | Temp 98.6°F | Resp 16 | Ht 68.0 in | Wt 201.2 lb

## 2020-07-26 DIAGNOSIS — C188 Malignant neoplasm of overlapping sites of colon: Secondary | ICD-10-CM | POA: Diagnosis not present

## 2020-07-26 NOTE — Telephone Encounter (Signed)
07/26/20 Spoke with patient and sched chemo ed

## 2020-07-29 ENCOUNTER — Other Ambulatory Visit: Payer: Self-pay | Admitting: Oncology

## 2020-07-29 ENCOUNTER — Other Ambulatory Visit: Payer: Self-pay

## 2020-07-29 ENCOUNTER — Inpatient Hospital Stay (INDEPENDENT_AMBULATORY_CARE_PROVIDER_SITE_OTHER): Payer: PRIVATE HEALTH INSURANCE | Admitting: Hematology and Oncology

## 2020-07-29 VITALS — BP 118/73 | HR 114 | Temp 97.7°F | Resp 18 | Ht 68.0 in | Wt 204.0 lb

## 2020-07-29 DIAGNOSIS — E876 Hypokalemia: Secondary | ICD-10-CM

## 2020-07-29 DIAGNOSIS — C182 Malignant neoplasm of ascending colon: Secondary | ICD-10-CM

## 2020-07-29 MED ORDER — PROCHLORPERAZINE MALEATE 10 MG PO TABS: 10.0000 mg | ORAL_TABLET | Freq: Four times a day (QID) | ORAL | 3 refills | Status: DC | PRN

## 2020-07-29 MED ORDER — ONDANSETRON HCL 4 MG PO TABS
4.0000 mg | ORAL_TABLET | ORAL | 3 refills | Status: AC | PRN
Start: 2020-07-29 — End: ?

## 2020-07-29 NOTE — Progress Notes (Signed)
START ON PATHWAY REGIMEN - Colorectal     A cycle is every 14 days:     Bevacizumab-xxxx      Irinotecan      Leucovorin      Fluorouracil      Fluorouracil   **Always confirm dose/schedule in your pharmacy ordering system**  Patient Characteristics: Distant Metastases, Nonsurgical Candidate, KRAS/NRAS Wild-Type (BRAF V600 Wild-Type/Unknown), Standard Cytotoxic Therapy, Second Line Standard Cytotoxic Therapy, Bevacizumab Eligible Tumor Location: Colon Therapeutic Status: Distant Metastases Microsatellite/Mismatch Repair Status: MSS/pMMR BRAF Mutation Status: Wild-Type (no mutation) KRAS/NRAS Mutation Status: Wild-Type (no mutation) Preferred Therapy Approach: Standard Cytotoxic Therapy Standard Cytotoxic Line of Therapy: Second Line Standard Cytotoxic Therapy Bevacizumab Eligibility: Eligible Intent of Therapy: Non-Curative / Palliative Intent, Discussed with Patient 

## 2020-07-29 NOTE — Progress Notes (Signed)
The patient is a with newly diagnosed colon cancer with brain metastases.  Patient presents to clinic today with her mother for chemotherapy education and palliative care consult.  We will start FOLFIRI which consists of fluorouracil, leucovorin, irinotecan with bevacizumab on August 06, 2020.  We will send in prescriptions for prochlorperazine and ondansetron.  The patient verbalizes understanding of and agreement to the plan as discussed today.  Provided general information including the following: 1.  Date of education: 07-29-2020 2.  Physician name: Dr. Bobby Rumpf 3.  Diagnosis: Colon cancer with metastatic brain lesion 4.  Stage: stage IIIC 5.  Control 6.  Chemotherapy plan including drugs and how often: fluorouracil, leucovorin, irinotecan and bevacizumab 7.  Start date: 08-06-2020 8.  Other referrals: No other referrals at this time 9.  The patient is to call our office with any questions or concerns.  Our office number [phone], if after hours or on the weekend, call the same number and wait for the answering service.  There is always an oncologist on call 10.  Medications prescribed: ondansetron and compazine 11.  The patient has verbalized understanding of the treatment plan and has no barriers to adherence or understanding.  Obtained signed consent from patient.  Discussed symptoms including 1.  Low blood counts including red blood cells, white blood cells and platelets. 2. Infection including to avoid large crowds, wash hands frequently, and stay away from people who were sick.  If fever develops of 100.4 or higher, call our office. 3.  Mucositis-given instructions on mouth rinse (baking soda and salt mixture).  Keep mouth clean.  Use soft bristle toothbrush.  If mouth sores develop, call our clinic. 4.  Nausea/vomiting-gave prescriptions for ondansetron 4 mg every 4 hours as needed for nausea, may take around the clock if persistent.  Compazine 10 mg every 6 hours, may take around the  clock if persistent. 5.  Diarrhea-use over-the-counter Imodium.  Call clinic if not controlled. 6.  Constipation-use senna, 1 to 2 tablets twice a day.  If no BM in 2 to 3 days call the clinic. 7.  Loss of appetite-try to eat small meals every 2-3 hours.  Call clinic if not eating. 8.  Taste changes-zinc 500 mg daily.  If becomes severe call clinic. 9.  Alcoholic beverages. 10.  Drink 2 to 3 quarts of water per day. 11.  Peripheral neuropathy-patient to call if numbness or tingling in hands or feet is persistent   Gave information on the supportive care team and how to contact them regarding services.  Discussed advanced directives.  The patient does not have their advanced directives but will look at the copy provided in their notebook and will call with any questions. [employer] Social worker Advanced directives  Answered questions to patient satisfaction.  Patient is to call with any further questions or concerns.  Time spent on this palliative care/chemotherapy education was 60 minutes with more than 50% spent discussing diagnosis, prognosis and symptom management.

## 2020-07-30 ENCOUNTER — Encounter: Payer: Self-pay | Admitting: Oncology

## 2020-08-02 ENCOUNTER — Other Ambulatory Visit: Payer: Self-pay | Admitting: Internal Medicine

## 2020-08-02 ENCOUNTER — Encounter: Payer: Self-pay | Admitting: Oncology

## 2020-08-02 ENCOUNTER — Other Ambulatory Visit: Payer: Self-pay | Admitting: Physical Medicine and Rehabilitation

## 2020-08-05 ENCOUNTER — Ambulatory Visit: Admission: RE | Admit: 2020-08-05 | Payer: PRIVATE HEALTH INSURANCE | Source: Ambulatory Visit

## 2020-08-05 ENCOUNTER — Inpatient Hospital Stay: Payer: PRIVATE HEALTH INSURANCE | Attending: Oncology

## 2020-08-05 ENCOUNTER — Other Ambulatory Visit: Payer: Self-pay

## 2020-08-05 ENCOUNTER — Other Ambulatory Visit: Payer: Self-pay | Admitting: Hematology and Oncology

## 2020-08-05 DIAGNOSIS — C189 Malignant neoplasm of colon, unspecified: Secondary | ICD-10-CM | POA: Diagnosis present

## 2020-08-05 DIAGNOSIS — Z5111 Encounter for antineoplastic chemotherapy: Secondary | ICD-10-CM | POA: Insufficient documentation

## 2020-08-05 DIAGNOSIS — C7971 Secondary malignant neoplasm of right adrenal gland: Secondary | ICD-10-CM | POA: Insufficient documentation

## 2020-08-05 DIAGNOSIS — C7800 Secondary malignant neoplasm of unspecified lung: Secondary | ICD-10-CM | POA: Diagnosis not present

## 2020-08-05 DIAGNOSIS — Z5112 Encounter for antineoplastic immunotherapy: Secondary | ICD-10-CM | POA: Diagnosis not present

## 2020-08-05 DIAGNOSIS — C7931 Secondary malignant neoplasm of brain: Secondary | ICD-10-CM | POA: Diagnosis not present

## 2020-08-05 DIAGNOSIS — C182 Malignant neoplasm of ascending colon: Secondary | ICD-10-CM

## 2020-08-05 LAB — BASIC METABOLIC PANEL
BUN: 16 (ref 4–21)
CO2: 23 — AB (ref 13–22)
Chloride: 105 (ref 99–108)
Creatinine: 1 (ref 0.5–1.1)
Glucose: 298
Potassium: 3.2 — AB (ref 3.4–5.3)
Sodium: 137 (ref 137–147)

## 2020-08-05 LAB — COMPREHENSIVE METABOLIC PANEL
Albumin: 3.9 (ref 3.5–5.0)
Calcium: 8.5 — AB (ref 8.7–10.7)

## 2020-08-05 LAB — CBC: RBC: 3.91 (ref 3.87–5.11)

## 2020-08-05 LAB — CBC AND DIFFERENTIAL
HCT: 36 (ref 36–46)
Hemoglobin: 12.4 (ref 12.0–16.0)
Neutrophils Absolute: 3.14
Platelets: 165 (ref 150–399)
WBC: 5.5

## 2020-08-05 LAB — HEPATIC FUNCTION PANEL
ALT: 28 (ref 7–35)
AST: 17 (ref 13–35)
Alkaline Phosphatase: 111 (ref 25–125)
Bilirubin, Total: 0.3

## 2020-08-05 LAB — TOTAL PROTEIN, URINE DIPSTICK: Protein, ur: NEGATIVE mg/dL

## 2020-08-05 MED ORDER — POTASSIUM CHLORIDE CRYS ER 20 MEQ PO TBCR
20.0000 meq | EXTENDED_RELEASE_TABLET | Freq: Every day | ORAL | 0 refills | Status: DC
Start: 2020-08-05 — End: 2020-08-16

## 2020-08-06 ENCOUNTER — Ambulatory Visit
Admission: RE | Admit: 2020-08-06 | Discharge: 2020-08-06 | Disposition: A | Discharge status: Home / Self Care | Payer: PRIVATE HEALTH INSURANCE | Source: Ambulatory Visit | Attending: Radiation Oncology | Admitting: Radiation Oncology

## 2020-08-06 ENCOUNTER — Inpatient Hospital Stay: Payer: PRIVATE HEALTH INSURANCE

## 2020-08-06 VITALS — BP 110/81 | HR 98 | Temp 97.5°F | Resp 18 | Ht 68.0 in | Wt 204.2 lb

## 2020-08-06 DIAGNOSIS — Z5112 Encounter for antineoplastic immunotherapy: Secondary | ICD-10-CM | POA: Diagnosis not present

## 2020-08-06 DIAGNOSIS — C182 Malignant neoplasm of ascending colon: Secondary | ICD-10-CM

## 2020-08-06 DIAGNOSIS — C7931 Secondary malignant neoplasm of brain: Secondary | ICD-10-CM | POA: Insufficient documentation

## 2020-08-06 MED ORDER — SODIUM CHLORIDE 0.9 % IV SOLN
Freq: Once | INTRAVENOUS | Status: AC
  Filled 2020-08-06: qty 250

## 2020-08-06 MED ORDER — FLUOROURACIL CHEMO INJECTION 2.5 GM/50ML
400.0000 mg/m2 | Freq: Once | INTRAVENOUS | Status: AC
  Administered 2020-08-06: 850 mg via INTRAVENOUS
  Filled 2020-08-06: qty 17

## 2020-08-06 MED ORDER — SODIUM CHLORIDE 0.9 % IV SOLN
5.0000 mg/kg | Freq: Once | INTRAVENOUS | Status: AC
  Administered 2020-08-06: 500 mg via INTRAVENOUS
  Filled 2020-08-06: qty 16

## 2020-08-06 MED ORDER — ATROPINE SULFATE 1 MG/ML IJ SOLN
INTRAMUSCULAR | Status: AC
  Filled 2020-08-06: qty 1

## 2020-08-06 MED ORDER — PALONOSETRON HCL INJECTION 0.25 MG/5ML
INTRAVENOUS | Status: AC
  Filled 2020-08-06: qty 5

## 2020-08-06 MED ORDER — HEPARIN SOD (PORK) LOCK FLUSH 100 UNIT/ML IV SOLN
500.0000 [IU] | Freq: Once | INTRAVENOUS | Status: DC | PRN
  Filled 2020-08-06: qty 5

## 2020-08-06 MED ORDER — SODIUM CHLORIDE 0.9 % IV SOLN
2400.0000 mg/m2 | INTRAVENOUS | Status: DC
  Administered 2020-08-06: 5000 mg via INTRAVENOUS
  Filled 2020-08-06: qty 100

## 2020-08-06 MED ORDER — PALONOSETRON HCL INJECTION 0.25 MG/5ML
0.2500 mg | Freq: Once | INTRAVENOUS | Status: AC
  Administered 2020-08-06: 0.25 mg via INTRAVENOUS

## 2020-08-06 MED ORDER — ATROPINE SULFATE 1 MG/ML IJ SOLN
0.5000 mg | Freq: Once | INTRAMUSCULAR | Status: AC | PRN
  Administered 2020-08-06: 0.5 mg via INTRAVENOUS

## 2020-08-06 MED ORDER — SODIUM CHLORIDE 0.9 % IV SOLN
10.0000 mg | Freq: Once | INTRAVENOUS | Status: AC
  Administered 2020-08-06: 10 mg via INTRAVENOUS
  Filled 2020-08-06: qty 10

## 2020-08-06 MED ORDER — IRINOTECAN HCL CHEMO INJECTION 100 MG/5ML
180.0000 mg/m2 | Freq: Once | INTRAVENOUS | Status: AC
  Administered 2020-08-06: 380 mg via INTRAVENOUS
  Filled 2020-08-06: qty 19

## 2020-08-06 MED ORDER — LEUCOVORIN CALCIUM INJECTION 350 MG
400.0000 mg/m2 | Freq: Once | INTRAVENOUS | Status: AC
  Administered 2020-08-06: 836 mg via INTRAVENOUS
  Filled 2020-08-06: qty 25

## 2020-08-06 NOTE — Progress Notes (Signed)
Irinotecan dose not received by pt-infusion line connection not secured.  Dr. Bobby Rumpf made aware.

## 2020-08-06 NOTE — Progress Notes (Signed)
  Radiation Oncology         [id number]) [phone] ________________________________  Name: Lajada Janes MRN: [id number]  Date of Service: 08/06/20  DOB: 10/13/1965  Post Treatment Telephone Note  Diagnosis:  Stage IV Colon Cancer with mets to the Brain, Lung and Adrenal Gland (C79.31)  Interval Since Last Radiation:  4 weeks. Completed 3 Fractions of Post OP SRS on 07/08/20.   PTV1 Lt Cerebellum 58mm received a total of 27 Gy in 3 Fractions.    Narrative:  The patient was contacted today for routine follow-up. During treatment she did very well with radiotherapy and did not have significant side effects or complaints. Following the completion of treatment and her steroid taper, she did notice some new swelling near the surgical site and reached out to Dr. Saintclair Halsted for evaluation. Dr. Saintclair Halsted advised that she restart steroids for a short course.  Impression/Plan: 1. The patient has been doing well since the completion of stereotactic radiosurgery [facility]). She has plugged back in with Dr. Bobby Rumpf and had restaging work up done which unfortunately demonstrated new metastatic disease in her lung and adrenal gland. She is scheduled to begin systemic treatment today.         She has been back on steroids since 07/19/20, currently taking 2mg  daily. Per Dr. Renda Rolls instruction she will start a taper beginning 08/07/20.  (Decrease to 1mg  x5 days, then 0.5mg  x5 days, then stop)    We discussed that we would recommend repeat imaging with an MRI in the next 2 months  and follow-up with Dr. Mickeal Skinner. The patient's imaging will be reviewed in multidisciplinary brain and spine oncology conference.  Ms. Linebaugh will also continue to follow up with Dr. Bobby Rumpf in medical oncology for long term care.     Carita Pian Jevaughn Degollado, R.T.(R)(T)

## 2020-08-06 NOTE — Patient Instructions (Signed)
[facility] Instructions for Patients Receiving Chemotherapy  Today you received the following chemotherapy agents Bevacizumab, Fluorouracil, Irinotecan, Leucovorin.  To help prevent nausea and vomiting after your treatment, we encourage you to take your nausea medication.  Leucovorin injection What is this medicine? LEUCOVORIN (loo koe VOR in) is used to prevent or treat the harmful effects of some medicines. This medicine is used to treat anemia caused by a low amount of folic acid in the body. It is also used with 5-fluorouracil (5-FU) to treat colon cancer. This medicine may be used for other purposes; ask your health care provider or pharmacist if you have questions. What should I tell my health care provider before I take this medicine? They need to know if you have any of these conditions:  anemia from low levels of vitamin B-12 in the blood  an unusual or allergic reaction to leucovorin, folic acid, other medicines, foods, dyes, or preservatives  pregnant or trying to get pregnant  breast-feeding How should I use this medicine? This medicine is for injection into a muscle or into a vein. It is given by a health care professional in a hospital or clinic setting. Talk to your pediatrician regarding the use of this medicine in children. Special care may be needed. Overdosage: If you think you have taken too much of this medicine contact a poison control center or emergency room at once. NOTE: This medicine is only for you. Do not share this medicine with others. What if I miss a dose? This does not apply. What may interact with this medicine?  capecitabine  fluorouracil  phenobarbital  phenytoin  primidone  trimethoprim-sulfamethoxazole This list may not describe all possible interactions. Give your health care provider a list of all the medicines, herbs, non-prescription drugs, or dietary supplements you use. Also tell them if you  smoke, drink alcohol, or use illegal drugs. Some items may interact with your medicine. What should I watch for while using this medicine? Your condition will be monitored carefully while you are receiving this medicine. This medicine may increase the side effects of 5-fluorouracil, 5-FU. Tell your doctor or health care professional if you have diarrhea or mouth sores that do not get better or that get worse. What side effects may I notice from receiving this medicine? Side effects that you should report to your doctor or health care professional as soon as possible:  allergic reactions like skin rash, itching or hives, swelling of the face, lips, or tongue  breathing problems  fever, infection  mouth sores  unusual bleeding or bruising  unusually weak or tired Side effects that usually do not require medical attention (report to your doctor or health care professional if they continue or are bothersome):  constipation or diarrhea  loss of appetite  nausea, vomiting This list may not describe all possible side effects. Call your doctor for medical advice about side effects. You may report side effects to FDA at 1-800-FDA-1088. Where should I keep my medicine? This drug is given in a hospital or clinic and will not be stored at home. NOTE: This sheet is a summary. It may not cover all possible information. If you have questions about this medicine, talk to your doctor, pharmacist, or health care provider.  2021 Elsevier/Gold Standard (2007-11-22 16:50:29)    Irinotecan injection What is this medicine? IRINOTECAN (ir in oh TEE kan ) is a chemotherapy drug. It is used to treat colon and rectal cancer. This medicine may  be used for other purposes; ask your health care provider or pharmacist if you have questions. COMMON BRAND NAME(S): Camptosar What should I tell my health care provider before I take this medicine? They need to know if you have any of these  conditions:  dehydration  diarrhea  infection (especially a virus infection such as chickenpox, cold sores, or herpes)  liver disease  low blood counts, like low white cell, platelet, or red cell counts  low levels of calcium, magnesium, or potassium in the blood  recent or ongoing radiation therapy  an unusual or allergic reaction to irinotecan, other medicines, foods, dyes, or preservatives  pregnant or trying to get pregnant  breast-feeding How should I use this medicine? This drug is given as an infusion into a vein. It is administered in a hospital or clinic by a specially trained health care professional. Talk to your pediatrician regarding the use of this medicine in children. Special care may be needed. Overdosage: If you think you have taken too much of this medicine contact a poison control center or emergency room at once. NOTE: This medicine is only for you. Do not share this medicine with others. What if I miss a dose? It is important not to miss your dose. Call your doctor or health care professional if you are unable to keep an appointment. What may interact with this medicine? Do not take this medicine with any of the following medications:  cobicistat  itraconazole This medicine may interact with the following medications:  antiviral medicines for HIV or AIDS  certain antibiotics like rifampin or rifabutin  certain medicines for fungal infections like ketoconazole, posaconazole, and voriconazole  certain medicines for seizures like carbamazepine, phenobarbital, phenotoin  clarithromycin  gemfibrozil  nefazodone  St. John's Wort This list may not describe all possible interactions. Give your health care provider a list of all the medicines, herbs, non-prescription drugs, or dietary supplements you use. Also tell them if you smoke, drink alcohol, or use illegal drugs. Some items may interact with your medicine. What should I watch for while using  this medicine? Your condition will be monitored carefully while you are receiving this medicine. You will need important blood work done while you are taking this medicine. This drug may make you feel generally unwell. This is not uncommon, as chemotherapy can affect healthy cells as well as cancer cells. Report any side effects. Continue your course of treatment even though you feel ill unless your doctor tells you to stop. In some cases, you may be given additional medicines to help with side effects. Follow all directions for their use. You may get drowsy or dizzy. Do not drive, use machinery, or do anything that needs mental alertness until you know how this medicine affects you. Do not stand or sit up quickly, especially if you are an older patient. This reduces the risk of dizzy or fainting spells. Call your health care professional for advice if you get a fever, chills, or sore throat, or other symptoms of a cold or flu. Do not treat yourself. This medicine decreases your body's ability to fight infections. Try to avoid being around people who are sick. Avoid taking products that contain aspirin, acetaminophen, ibuprofen, naproxen, or ketoprofen unless instructed by your doctor. These medicines may hide a fever. This medicine may increase your risk to bruise or bleed. Call your doctor or health care professional if you notice any unusual bleeding. Be careful brushing and flossing your teeth or using a toothpick  because you may get an infection or bleed more easily. If you have any dental work done, tell your dentist you are receiving this medicine. Do not become pregnant while taking this medicine or for 6 months after stopping it. Women should inform their health care professional if they wish to become pregnant or think they might be pregnant. Men should not father a child while taking this medicine and for 3 months after stopping it. There is potential for serious side effects to an unborn child.  Talk to your health care professional for more information. Do not breast-feed an infant while taking this medicine or for 7 days after stopping it. This medicine has caused ovarian failure in some women. This medicine may make it more difficult to get pregnant. Talk to your health care professional if you are concerned about your fertility. This medicine has caused decreased sperm counts in some men. This may make it more difficult to father a child. Talk to your health care professional if you are concerned about your fertility. What side effects may I notice from receiving this medicine? Side effects that you should report to your doctor or health care professional as soon as possible:  allergic reactions like skin rash, itching or hives, swelling of the face, lips, or tongue  chest pain  diarrhea  flushing, runny nose, sweating during infusion  low blood counts - this medicine may decrease the number of white blood cells, red blood cells and platelets. You may be at increased risk for infections and bleeding.  nausea, vomiting  pain, swelling, warmth in the leg  signs of decreased platelets or bleeding - bruising, pinpoint red spots on the skin, black, tarry stools, blood in the urine  signs of infection - fever or chills, cough, sore throat, pain or difficulty passing urine  signs of decreased red blood cells - unusually weak or tired, fainting spells, lightheadedness Side effects that usually do not require medical attention (report to your doctor or health care professional if they continue or are bothersome):  constipation  hair loss  headache  loss of appetite  mouth sores  stomach pain This list may not describe all possible side effects. Call your doctor for medical advice about side effects. You may report side effects to FDA at 1-800-FDA-1088. Where should I keep my medicine? This drug is given in a hospital or clinic and will not be stored at home. NOTE: This  sheet is a summary. It may not cover all possible information. If you have questions about this medicine, talk to your doctor, pharmacist, or health care provider.  2021 Elsevier/Gold Standard (2019-04-18 17:46:13)    Fluorouracil, 5-FU injection What is this medicine? FLUOROURACIL, 5-FU (flure oh YOOR a sil) is a chemotherapy drug. It slows the growth of cancer cells. This medicine is used to treat many types of cancer like breast cancer, colon or rectal cancer, pancreatic cancer, and stomach cancer. This medicine may be used for other purposes; ask your health care provider or pharmacist if you have questions. COMMON BRAND NAME(S): Adrucil What should I tell my health care provider before I take this medicine? They need to know if you have any of these conditions:  blood disorders  dihydropyrimidine dehydrogenase (DPD) deficiency  infection (especially a virus infection such as chickenpox, cold sores, or herpes)  kidney disease  liver disease  malnourished, poor nutrition  recent or ongoing radiation therapy  an unusual or allergic reaction to fluorouracil, other chemotherapy, other medicines, foods, dyes, or  preservatives  pregnant or trying to get pregnant  breast-feeding How should I use this medicine? This drug is given as an infusion or injection into a vein. It is administered in a hospital or clinic by a specially trained health care professional. Talk to your pediatrician regarding the use of this medicine in children. Special care may be needed. Overdosage: If you think you have taken too much of this medicine contact a poison control center or emergency room at once. NOTE: This medicine is only for you. Do not share this medicine with others. What if I miss a dose? It is important not to miss your dose. Call your doctor or health care professional if you are unable to keep an appointment. What may interact with this medicine? Do not take this medicine with any of  the following medications:  live virus vaccines This medicine may also interact with the following medications:  medicines that treat or prevent blood clots like warfarin, enoxaparin, and dalteparin This list may not describe all possible interactions. Give your health care provider a list of all the medicines, herbs, non-prescription drugs, or dietary supplements you use. Also tell them if you smoke, drink alcohol, or use illegal drugs. Some items may interact with your medicine. What should I watch for while using this medicine? Visit your doctor for checks on your progress. This drug may make you feel generally unwell. This is not uncommon, as chemotherapy can affect healthy cells as well as cancer cells. Report any side effects. Continue your course of treatment even though you feel ill unless your doctor tells you to stop. In some cases, you may be given additional medicines to help with side effects. Follow all directions for their use. Call your doctor or health care professional for advice if you get a fever, chills or sore throat, or other symptoms of a cold or flu. Do not treat yourself. This drug decreases your body's ability to fight infections. Try to avoid being around people who are sick. This medicine may increase your risk to bruise or bleed. Call your doctor or health care professional if you notice any unusual bleeding. Be careful brushing and flossing your teeth or using a toothpick because you may get an infection or bleed more easily. If you have any dental work done, tell your dentist you are receiving this medicine. Avoid taking products that contain aspirin, acetaminophen, ibuprofen, naproxen, or ketoprofen unless instructed by your doctor. These medicines may hide a fever. Do not become pregnant while taking this medicine. Women should inform their doctor if they wish to become pregnant or think they might be pregnant. There is a potential for serious side effects to an unborn  child. Talk to your health care professional or pharmacist for more information. Do not breast-feed an infant while taking this medicine. Men should inform their doctor if they wish to father a child. This medicine may lower sperm counts. Do not treat diarrhea with over the counter products. Contact your doctor if you have diarrhea that lasts more than 2 days or if it is severe and watery. This medicine can make you more sensitive to the sun. Keep out of the sun. If you cannot avoid being in the sun, wear protective clothing and use sunscreen. Do not use sun lamps or tanning beds/booths. What side effects may I notice from receiving this medicine? Side effects that you should report to your doctor or health care professional as soon as possible:  allergic reactions like skin rash,  itching or hives, swelling of the face, lips, or tongue  low blood counts - this medicine may decrease the number of white blood cells, red blood cells and platelets. You may be at increased risk for infections and bleeding.  signs of infection - fever or chills, cough, sore throat, pain or difficulty passing urine  signs of decreased platelets or bleeding - bruising, pinpoint red spots on the skin, black, tarry stools, blood in the urine  signs of decreased red blood cells - unusually weak or tired, fainting spells, lightheadedness  breathing problems  changes in vision  chest pain  mouth sores  nausea and vomiting  pain, swelling, redness at site where injected  pain, tingling, numbness in the hands or feet  redness, swelling, or sores on hands or feet  stomach pain  unusual bleeding Side effects that usually do not require medical attention (report to your doctor or health care professional if they continue or are bothersome):  changes in finger or toe nails  diarrhea  dry or itchy skin  hair loss  headache  loss of appetite  sensitivity of eyes to the light  stomach upset  unusually  teary eyes This list may not describe all possible side effects. Call your doctor for medical advice about side effects. You may report side effects to FDA at 1-800-FDA-1088. Where should I keep my medicine? This drug is given in a hospital or clinic and will not be stored at home. NOTE: This sheet is a summary. It may not cover all possible information. If you have questions about this medicine, talk to your doctor, pharmacist, or health care provider.  2021 Elsevier/Gold Standard (2019-04-18 15:00:03)     Bevacizumab injection What is this medicine? BEVACIZUMAB (be va SIZ yoo mab) is a monoclonal antibody. It is used to treat many types of cancer. This medicine may be used for other purposes; ask your health care provider or pharmacist if you have questions. COMMON BRAND NAME(S): Avastin, MVASI, Zirabev What should I tell my health care provider before I take this medicine? They need to know if you have any of these conditions:  diabetes  heart disease  high blood pressure  history of coughing up blood  prior anthracycline chemotherapy (e.g., doxorubicin, daunorubicin, epirubicin)  recent or ongoing radiation therapy  recent or planning to have surgery  stroke  an unusual or allergic reaction to bevacizumab, hamster proteins, mouse proteins, other medicines, foods, dyes, or preservatives  pregnant or trying to get pregnant  breast-feeding How should I use this medicine? This medicine is for infusion into a vein. It is given by a health care professional in a hospital or clinic setting. Talk to your pediatrician regarding the use of this medicine in children. Special care may be needed. Overdosage: If you think you have taken too much of this medicine contact a poison control center or emergency room at once. NOTE: This medicine is only for you. Do not share this medicine with others. What if I miss a dose? It is important not to miss your dose. Call your doctor or  health care professional if you are unable to keep an appointment. What may interact with this medicine? Interactions are not expected. This list may not describe all possible interactions. Give your health care provider a list of all the medicines, herbs, non-prescription drugs, or dietary supplements you use. Also tell them if you smoke, drink alcohol, or use illegal drugs. Some items may interact with your medicine. What should I  watch for while using this medicine? Your condition will be monitored carefully while you are receiving this medicine. You will need important blood work and urine testing done while you are taking this medicine. This medicine may increase your risk to bruise or bleed. Call your doctor or health care professional if you notice any unusual bleeding. Before having surgery, talk to your health care provider to make sure it is ok. This drug can increase the risk of poor healing of your surgical site or wound. You will need to stop this drug for 28 days before surgery. After surgery, wait at least 28 days before restarting this drug. Make sure the surgical site or wound is healed enough before restarting this drug. Talk to your health care provider if questions. Do not become pregnant while taking this medicine or for 6 months after stopping it. Women should inform their doctor if they wish to become pregnant or think they might be pregnant. There is a potential for serious side effects to an unborn child. Talk to your health care professional or pharmacist for more information. Do not breast-feed an infant while taking this medicine and for 6 months after the last dose. This medicine has caused ovarian failure in some women. This medicine may interfere with the ability to have a child. You should talk to your doctor or health care professional if you are concerned about your fertility. What side effects may I notice from receiving this medicine? Side effects that you should report  to your doctor or health care professional as soon as possible:  allergic reactions like skin rash, itching or hives, swelling of the face, lips, or tongue  chest pain or chest tightness  chills  coughing up blood  high fever  seizures  severe constipation  signs and symptoms of bleeding such as bloody or black, tarry stools; red or dark-brown urine; spitting up blood or brown material that looks like coffee grounds; red spots on the skin; unusual bruising or bleeding from the eye, gums, or nose  signs and symptoms of a blood clot such as breathing problems; chest pain; severe, sudden headache; pain, swelling, warmth in the leg  signs and symptoms of a stroke like changes in vision; confusion; trouble speaking or understanding; severe headaches; sudden numbness or weakness of the face, arm or leg; trouble walking; dizziness; loss of balance or coordination  stomach pain  sweating  swelling of legs or ankles  vomiting  weight gain Side effects that usually do not require medical attention (report to your doctor or health care professional if they continue or are bothersome):  back pain  changes in taste  decreased appetite  dry skin  nausea  tiredness This list may not describe all possible side effects. Call your doctor for medical advice about side effects. You may report side effects to FDA at 1-800-FDA-1088. Where should I keep my medicine? This drug is given in a hospital or clinic and will not be stored at home. NOTE: This sheet is a summary. It may not cover all possible information. If you have questions about this medicine, talk to your doctor, pharmacist, or health care provider.  2021 Elsevier/Gold Standard (2019-03-15 10:50:46)    If you develop nausea and vomiting that is not controlled by your nausea medication, call the clinic.   BELOW ARE SYMPTOMS THAT SHOULD BE REPORTED IMMEDIATELY:  *FEVER GREATER THAN 100.5 F  *CHILLS WITH OR WITHOUT  FEVER  NAUSEA AND VOMITING THAT IS NOT CONTROLLED WITH YOUR  NAUSEA MEDICATION  *UNUSUAL SHORTNESS OF BREATH  *UNUSUAL BRUISING OR BLEEDING  TENDERNESS IN MOUTH AND THROAT WITH OR WITHOUT PRESENCE OF ULCERS  *URINARY PROBLEMS  *BOWEL PROBLEMS  UNUSUAL RASH Items with * indicate a potential emergency and should be followed up as soon as possible.  Feel free to call the clinic should you have any questions or concerns at The clinic phone number is [phone].  Please show the [place] at check-in to the Emergency Department and triage nurse.

## 2020-08-07 ENCOUNTER — Telehealth: Payer: Self-pay

## 2020-08-07 NOTE — Telephone Encounter (Signed)
I spoke with pt to see how she did overnight since 1st infusion yesterday. She has had some nausea & wasn't quite sure which nausea medication to take. No emesis. I told her she could try either one, and if nausea hasn't subsided in 1 hour, to take the other antiemetic. I asked her to keep a log of when she takes the Zofran & Compazine, as to avoid confusion. Pt denies fever, diarrhea, and skin rashes. She hasn't had any problems with the 5FU pump. I reminded pt of the importance of calling us with temp of 100.4 or higher, DAY OR NIGHT. Pt verbalized understanding.

## 2020-08-08 ENCOUNTER — Inpatient Hospital Stay: Payer: PRIVATE HEALTH INSURANCE

## 2020-08-08 ENCOUNTER — Encounter (HOSPITAL_COMMUNITY): Payer: Self-pay

## 2020-08-08 ENCOUNTER — Encounter (HOSPITAL_COMMUNITY): Payer: Self-pay | Admitting: Oncology

## 2020-08-08 ENCOUNTER — Other Ambulatory Visit: Payer: Self-pay

## 2020-08-08 VITALS — BP 106/66 | HR 104 | Resp 18 | Ht 68.0 in | Wt 204.8 lb

## 2020-08-08 DIAGNOSIS — C182 Malignant neoplasm of ascending colon: Secondary | ICD-10-CM

## 2020-08-08 DIAGNOSIS — Z5112 Encounter for antineoplastic immunotherapy: Secondary | ICD-10-CM | POA: Diagnosis not present

## 2020-08-08 MED ORDER — HEPARIN SOD (PORK) LOCK FLUSH 100 UNIT/ML IV SOLN
500.0000 [IU] | Freq: Once | INTRAVENOUS | Status: AC | PRN
Start: 2020-08-08 — End: 2020-08-08
  Administered 2020-08-08: 500 [IU]
  Filled 2020-08-08: qty 5

## 2020-08-08 MED ORDER — SODIUM CHLORIDE 0.9% FLUSH
10.0000 mL | INTRAVENOUS | Status: DC | PRN
  Administered 2020-08-08: 10 mL
  Filled 2020-08-08: qty 10

## 2020-08-08 NOTE — Patient Instructions (Signed)
Fluorouracil, 5-FU injection What is this medicine? FLUOROURACIL, 5-FU (flure oh YOOR a sil) is a chemotherapy drug. It slows the growth of cancer cells. This medicine is used to treat many types of cancer like breast cancer, colon or rectal cancer, pancreatic cancer, and stomach cancer. This medicine may be used for other purposes; ask your health care provider or pharmacist if you have questions. COMMON BRAND NAME(S): Adrucil What should I tell my health care provider before I take this medicine? They need to know if you have any of these conditions:  blood disorders  dihydropyrimidine dehydrogenase (DPD) deficiency  infection (especially a virus infection such as chickenpox, cold sores, or herpes)  kidney disease  liver disease  malnourished, poor nutrition  recent or ongoing radiation therapy  an unusual or allergic reaction to fluorouracil, other chemotherapy, other medicines, foods, dyes, or preservatives  pregnant or trying to get pregnant  breast-feeding How should I use this medicine? This drug is given as an infusion or injection into a vein. It is administered in a hospital or clinic by a specially trained health care professional. Talk to your pediatrician regarding the use of this medicine in children. Special care may be needed. Overdosage: If you think you have taken too much of this medicine contact a poison control center or emergency room at once. NOTE: This medicine is only for you. Do not share this medicine with others. What if I miss a dose? It is important not to miss your dose. Call your doctor or health care professional if you are unable to keep an appointment. What may interact with this medicine? Do not take this medicine with any of the following medications:  live virus vaccines This medicine may also interact with the following medications:  medicines that treat or prevent blood clots like warfarin, enoxaparin, and dalteparin This list may not  describe all possible interactions. Give your health care provider a list of all the medicines, herbs, non-prescription drugs, or dietary supplements you use. Also tell them if you smoke, drink alcohol, or use illegal drugs. Some items may interact with your medicine. What should I watch for while using this medicine? Visit your doctor for checks on your progress. This drug may make you feel generally unwell. This is not uncommon, as chemotherapy can affect healthy cells as well as cancer cells. Report any side effects. Continue your course of treatment even though you feel ill unless your doctor tells you to stop. In some cases, you may be given additional medicines to help with side effects. Follow all directions for their use. Call your doctor or health care professional for advice if you get a fever, chills or sore throat, or other symptoms of a cold or flu. Do not treat yourself. This drug decreases your body's ability to fight infections. Try to avoid being around people who are sick. This medicine may increase your risk to bruise or bleed. Call your doctor or health care professional if you notice any unusual bleeding. Be careful brushing and flossing your teeth or using a toothpick because you may get an infection or bleed more easily. If you have any dental work done, tell your dentist you are receiving this medicine. Avoid taking products that contain aspirin, acetaminophen, ibuprofen, naproxen, or ketoprofen unless instructed by your doctor. These medicines may hide a fever. Do not become pregnant while taking this medicine. Women should inform their doctor if they wish to become pregnant or think they might be pregnant. There is a potential   for serious side effects to an unborn child. Talk to your health care professional or pharmacist for more information. Do not breast-feed an infant while taking this medicine. Men should inform their doctor if they wish to father a child. This medicine may  lower sperm counts. Do not treat diarrhea with over the counter products. Contact your doctor if you have diarrhea that lasts more than 2 days or if it is severe and watery. This medicine can make you more sensitive to the sun. Keep out of the sun. If you cannot avoid being in the sun, wear protective clothing and use sunscreen. Do not use sun lamps or tanning beds/booths. What side effects may I notice from receiving this medicine? Side effects that you should report to your doctor or health care professional as soon as possible:  allergic reactions like skin rash, itching or hives, swelling of the face, lips, or tongue  low blood counts - this medicine may decrease the number of white blood cells, red blood cells and platelets. You may be at increased risk for infections and bleeding.  signs of infection - fever or chills, cough, sore throat, pain or difficulty passing urine  signs of decreased platelets or bleeding - bruising, pinpoint red spots on the skin, black, tarry stools, blood in the urine  signs of decreased red blood cells - unusually weak or tired, fainting spells, lightheadedness  breathing problems  changes in vision  chest pain  mouth sores  nausea and vomiting  pain, swelling, redness at site where injected  pain, tingling, numbness in the hands or feet  redness, swelling, or sores on hands or feet  stomach pain  unusual bleeding Side effects that usually do not require medical attention (report to your doctor or health care professional if they continue or are bothersome):  changes in finger or toe nails  diarrhea  dry or itchy skin  hair loss  headache  loss of appetite  sensitivity of eyes to the light  stomach upset  unusually teary eyes This list may not describe all possible side effects. Call your doctor for medical advice about side effects. You may report side effects to FDA at 1-800-FDA-1088. Where should I keep my medicine? This  drug is given in a hospital or clinic and will not be stored at home. NOTE: This sheet is a summary. It may not cover all possible information. If you have questions about this medicine, talk to your doctor, pharmacist, or health care provider.  2021 [employer] (2019-04-18 15:00:03)

## 2020-08-15 NOTE — Progress Notes (Signed)
Shannon [name]  [address] [place],  [place]  [address] [phone]  Clinic Day:  3/18//2022  Referring physician: Greig Right, MD   HISTORY OF PRESENT ILLNESS:  The patient is a 55 y.o. female with metastatic colon cancer, which includes spread of disease to her abdominal cavity, lungs, and right adrenal gland. She also had a left cerebellar metastatectomy.  She comes in today to be evauated before heaidng into her 2nd cycle of FOLFIRI/Avastin.  The patient claims to have toleraed her 1st cycle of treatment fairly well.  She did have more fatigue than what she anticipated would occur.  She denies having any new GI or systemic symptoms which concern her for disease progression.  With respect to her colon cancer history, she is status post a right hemicolectomy in early October 2018, followed by 12 cycles of adjuvant chemotherapy, which were completed in April 2019.  In December 2021, she underwent a left cerebellar metastatectomy, whose pathology was consistent with metastatic colon cancer.  Tumor testing did come back MMR normal.    PHYSICAL EXAM:  Blood pressure 119/75, pulse (!) 103, temperature 98.1 F (36.7 C), resp. rate 16, height 5' 8"  (1.727 m), weight 202 lb 9.6 oz (91.9 kg), last menstrual period 02/22/2019, SpO2 96 %. Wt Readings from Last 3 Encounters:  08/16/20 202 lb 9.6 oz (91.9 kg)  08/08/20 204 lb 12 oz (92.9 kg)  08/06/20 204 lb 4 oz (92.6 kg)   Body mass index is 30.81 kg/m. Performance status (ECOG): 0 - Asymptomatic Physical Exam Constitutional:      Appearance: Normal appearance. She is not ill-appearing.  HENT:     Mouth/Throat:     Mouth: Mucous membranes are moist.     Pharynx: Oropharynx is clear. No oropharyngeal exudate or posterior oropharyngeal erythema.  Cardiovascular:     Rate and Rhythm: Normal rate and regular rhythm.     Heart sounds: No murmur heard. No friction rub. No gallop.   Pulmonary:     Effort:  Pulmonary effort is normal. No respiratory distress.     Breath sounds: Normal breath sounds. No wheezing, rhonchi or rales.  Chest:  Breasts:     Right: No axillary adenopathy or supraclavicular adenopathy.     Left: No axillary adenopathy or supraclavicular adenopathy.    Abdominal:     General: Bowel sounds are normal. There is no distension.     Palpations: Abdomen is soft. There is no mass.     Tenderness: There is no abdominal tenderness.  Musculoskeletal:        General: No swelling.     Right lower leg: No edema.     Left lower leg: No edema.  Lymphadenopathy:     Cervical: No cervical adenopathy.     Upper Body:     Right upper body: No supraclavicular or axillary adenopathy.     Left upper body: No supraclavicular or axillary adenopathy.     Lower Body: No right inguinal adenopathy. No left inguinal adenopathy.  Skin:    General: Skin is warm.     Coloration: Skin is not jaundiced.     Findings: No lesion or rash.  Neurological:     General: No focal deficit present.     Mental Status: She is alert and oriented to person, place, and time. Mental status is at baseline.     Cranial Nerves: Cranial nerves are intact.  Psychiatric:        Mood and Affect:  Mood normal.        Behavior: Behavior normal.        Thought Content: Thought content normal.    LABS:  Results for Shannon [name] (MRN [id number]) as of 08/16/2020 14:31  Ref. Range 08/16/2020 00:00  Sodium Latest Ref Range: 137 - 147  138  Potassium Latest Ref Range: 3.4 - 5.3  3.6  Chloride Latest Ref Range: 99 - 108  106  CO2 Latest Ref Range: 13 - 22  22  Glucose Unknown 196  BUN Latest Ref Range: 4 - 21  15  Creatinine Latest Ref Range: 0.5 - 1.1  1.1  Calcium Latest Ref Range: 8.7 - 10.7  9.4  Alkaline Phosphatase Latest Ref Range: 25 - 125  108  Albumin Latest Ref Range: 3.5 - 5.0  4.1  AST Latest Ref Range: 13 - 35  23  ALT Latest Ref Range: 7 - 35  24  Bilirubin, Total Unknown 0.4  WBC  Unknown 3.9  RBC Latest Ref Range: 3.87 - 5.11  3.84 (A)  Hemoglobin Latest Ref Range: 12.0 - 16.0  12.1  HCT Latest Ref Range: 36 - 46  36  MCV Latest Ref Range: 81 - 99  93  Platelets Latest Ref Range: 150 - 399  171  NEUT# Unknown 1.72     ASSESSMENT & PLAN:  Assessment/Plan:  A 55 y.o. female with metastatic colon cancer.  She will proceed with her 2nd cycle of FOLFIRI/Avastin next week. Clinically, the patient appears to be doing well.   I will see her back in 3 weeks before she heads into her 3rd cycle of FOLFIRI/Avastin.  The patient understands all the plans discussed today and is in agreement with them.    Shannon Plouffe Macarthur Critchley, MD

## 2020-08-16 ENCOUNTER — Inpatient Hospital Stay: Payer: PRIVATE HEALTH INSURANCE

## 2020-08-16 ENCOUNTER — Other Ambulatory Visit: Payer: Self-pay | Admitting: Oncology

## 2020-08-16 ENCOUNTER — Inpatient Hospital Stay (INDEPENDENT_AMBULATORY_CARE_PROVIDER_SITE_OTHER): Payer: PRIVATE HEALTH INSURANCE | Admitting: Oncology

## 2020-08-16 ENCOUNTER — Other Ambulatory Visit: Payer: Self-pay | Admitting: Hematology and Oncology

## 2020-08-16 ENCOUNTER — Other Ambulatory Visit: Payer: Self-pay

## 2020-08-16 VITALS — BP 119/75 | HR 103 | Temp 98.1°F | Resp 16 | Ht 68.0 in | Wt 202.6 lb

## 2020-08-16 DIAGNOSIS — C7931 Secondary malignant neoplasm of brain: Secondary | ICD-10-CM | POA: Diagnosis not present

## 2020-08-16 DIAGNOSIS — C182 Malignant neoplasm of ascending colon: Secondary | ICD-10-CM

## 2020-08-16 LAB — CBC
MCV: 93 (ref 81–99)
RBC: 3.84 — AB (ref 3.87–5.11)

## 2020-08-16 LAB — CBC AND DIFFERENTIAL
HCT: 36 (ref 36–46)
Hemoglobin: 12.1 (ref 12.0–16.0)
Neutrophils Absolute: 1.72
Platelets: 171 (ref 150–399)
WBC: 3.9

## 2020-08-16 LAB — HEPATIC FUNCTION PANEL
ALT: 24 (ref 7–35)
AST: 23 (ref 13–35)
Alkaline Phosphatase: 108 (ref 25–125)
Bilirubin, Total: 0.4

## 2020-08-16 LAB — BASIC METABOLIC PANEL
BUN: 15 (ref 4–21)
CO2: 22 (ref 13–22)
Chloride: 106 (ref 99–108)
Creatinine: 1.1 (ref 0.5–1.1)
Glucose: 196
Potassium: 3.6 (ref 3.4–5.3)
Sodium: 138 (ref 137–147)

## 2020-08-16 LAB — COMPREHENSIVE METABOLIC PANEL
Albumin: 4.1 (ref 3.5–5.0)
Calcium: 9.4 (ref 8.7–10.7)

## 2020-08-16 MED FILL — Dexamethasone Sodium Phosphate Inj 100 MG/10ML: INTRAMUSCULAR | Qty: 1 | Status: AC

## 2020-08-19 ENCOUNTER — Inpatient Hospital Stay: Payer: PRIVATE HEALTH INSURANCE

## 2020-08-19 ENCOUNTER — Other Ambulatory Visit: Payer: Self-pay

## 2020-08-19 VITALS — BP 132/71 | HR 98 | Temp 98.3°F | Resp 18

## 2020-08-19 DIAGNOSIS — Z5112 Encounter for antineoplastic immunotherapy: Secondary | ICD-10-CM | POA: Diagnosis not present

## 2020-08-19 DIAGNOSIS — C182 Malignant neoplasm of ascending colon: Secondary | ICD-10-CM

## 2020-08-19 MED ORDER — FLUOROURACIL CHEMO INJECTION 5 GM/100ML
2400.0000 mg/m2 | INTRAVENOUS | Status: DC
  Administered 2020-08-19: 5000 mg via INTRAVENOUS
  Filled 2020-08-19: qty 100

## 2020-08-19 MED ORDER — PALONOSETRON HCL INJECTION 0.25 MG/5ML
INTRAVENOUS | Status: AC
  Filled 2020-08-19: qty 5

## 2020-08-19 MED ORDER — IRINOTECAN HCL CHEMO INJECTION 100 MG/5ML
180.0000 mg/m2 | Freq: Once | INTRAVENOUS | Status: AC
  Administered 2020-08-19: 380 mg via INTRAVENOUS
  Filled 2020-08-19: qty 15

## 2020-08-19 MED ORDER — SODIUM CHLORIDE 0.9 % IV SOLN
Freq: Once | INTRAVENOUS | Status: AC
  Filled 2020-08-19: qty 250

## 2020-08-19 MED ORDER — SODIUM CHLORIDE 0.9 % IV SOLN
10.0000 mg | Freq: Once | INTRAVENOUS | Status: AC
  Administered 2020-08-19: 10 mg via INTRAVENOUS
  Filled 2020-08-19: qty 1

## 2020-08-19 MED ORDER — ATROPINE SULFATE 1 MG/ML IJ SOLN
INTRAMUSCULAR | Status: AC
  Filled 2020-08-19: qty 1

## 2020-08-19 MED ORDER — LEUCOVORIN CALCIUM INJECTION 350 MG
400.0000 mg/m2 | Freq: Once | INTRAVENOUS | Status: AC
  Administered 2020-08-19: 836 mg via INTRAVENOUS
  Filled 2020-08-19: qty 41.8

## 2020-08-19 MED ORDER — FLUOROURACIL CHEMO INJECTION 2.5 GM/50ML
400.0000 mg/m2 | Freq: Once | INTRAVENOUS | Status: AC
  Administered 2020-08-19: 850 mg via INTRAVENOUS
  Filled 2020-08-19: qty 17

## 2020-08-19 MED ORDER — SODIUM CHLORIDE 0.9 % IV SOLN
5.0000 mg/kg | Freq: Once | INTRAVENOUS | Status: AC
  Administered 2020-08-19: 500 mg via INTRAVENOUS
  Filled 2020-08-19: qty 16

## 2020-08-19 MED ORDER — ATROPINE SULFATE 1 MG/ML IJ SOLN
0.5000 mg | Freq: Once | INTRAMUSCULAR | Status: AC | PRN
  Administered 2020-08-19: 0.5 mg via INTRAVENOUS

## 2020-08-19 MED ORDER — PALONOSETRON HCL INJECTION 0.25 MG/5ML
0.2500 mg | Freq: Once | INTRAVENOUS | Status: AC
Start: 2020-08-19 — End: 2020-08-19
  Administered 2020-08-19: 0.25 mg via INTRAVENOUS

## 2020-08-19 NOTE — Patient Instructions (Signed)
Fluorouracil, 5-FU injection What is this medicine? FLUOROURACIL, 5-FU (flure oh YOOR a sil) is a chemotherapy drug. It slows the growth of cancer cells. This medicine is used to treat many types of cancer like breast cancer, colon or rectal cancer, pancreatic cancer, and stomach cancer. This medicine may be used for other purposes; ask your health care provider or pharmacist if you have questions. COMMON BRAND NAME(S): Adrucil What should I tell my health care provider before I take this medicine? They need to know if you have any of these conditions:  blood disorders  dihydropyrimidine dehydrogenase (DPD) deficiency  infection (especially a virus infection such as chickenpox, cold sores, or herpes)  kidney disease  liver disease  malnourished, poor nutrition  recent or ongoing radiation therapy  an unusual or allergic reaction to fluorouracil, other chemotherapy, other medicines, foods, dyes, or preservatives  pregnant or trying to get pregnant  breast-feeding How should I use this medicine? This drug is given as an infusion or injection into a vein. It is administered in a hospital or clinic by a specially trained health care professional. Talk to your pediatrician regarding the use of this medicine in children. Special care may be needed. Overdosage: If you think you have taken too much of this medicine contact a poison control center or emergency room at once. NOTE: This medicine is only for you. Do not share this medicine with others. What if I miss a dose? It is important not to miss your dose. Call your doctor or health care professional if you are unable to keep an appointment. What may interact with this medicine? Do not take this medicine with any of the following medications:  live virus vaccines This medicine may also interact with the following medications:  medicines that treat or prevent blood clots like warfarin, enoxaparin, and dalteparin This list may not  describe all possible interactions. Give your health care provider a list of all the medicines, herbs, non-prescription drugs, or dietary supplements you use. Also tell them if you smoke, drink alcohol, or use illegal drugs. Some items may interact with your medicine. What should I watch for while using this medicine? Visit your doctor for checks on your progress. This drug may make you feel generally unwell. This is not uncommon, as chemotherapy can affect healthy cells as well as cancer cells. Report any side effects. Continue your course of treatment even though you feel ill unless your doctor tells you to stop. In some cases, you may be given additional medicines to help with side effects. Follow all directions for their use. Call your doctor or health care professional for advice if you get a fever, chills or sore throat, or other symptoms of a cold or flu. Do not treat yourself. This drug decreases your body's ability to fight infections. Try to avoid being around people who are sick. This medicine may increase your risk to bruise or bleed. Call your doctor or health care professional if you notice any unusual bleeding. Be careful brushing and flossing your teeth or using a toothpick because you may get an infection or bleed more easily. If you have any dental work done, tell your dentist you are receiving this medicine. Avoid taking products that contain aspirin, acetaminophen, ibuprofen, naproxen, or ketoprofen unless instructed by your doctor. These medicines may hide a fever. Do not become pregnant while taking this medicine. Women should inform their doctor if they wish to become pregnant or think they might be pregnant. There is a potential   for serious side effects to an unborn child. Talk to your health care professional or pharmacist for more information. Do not breast-feed an infant while taking this medicine. Men should inform their doctor if they wish to father a child. This medicine may  lower sperm counts. Do not treat diarrhea with over the counter products. Contact your doctor if you have diarrhea that lasts more than 2 days or if it is severe and watery. This medicine can make you more sensitive to the sun. Keep out of the sun. If you cannot avoid being in the sun, wear protective clothing and use sunscreen. Do not use sun lamps or tanning beds/booths. What side effects may I notice from receiving this medicine? Side effects that you should report to your doctor or health care professional as soon as possible:  allergic reactions like skin rash, itching or hives, swelling of the face, lips, or tongue  low blood counts - this medicine may decrease the number of white blood cells, red blood cells and platelets. You may be at increased risk for infections and bleeding.  signs of infection - fever or chills, cough, sore throat, pain or difficulty passing urine  signs of decreased platelets or bleeding - bruising, pinpoint red spots on the skin, black, tarry stools, blood in the urine  signs of decreased red blood cells - unusually weak or tired, fainting spells, lightheadedness  breathing problems  changes in vision  chest pain  mouth sores  nausea and vomiting  pain, swelling, redness at site where injected  pain, tingling, numbness in the hands or feet  redness, swelling, or sores on hands or feet  stomach pain  unusual bleeding Side effects that usually do not require medical attention (report to your doctor or health care professional if they continue or are bothersome):  changes in finger or toe nails  diarrhea  dry or itchy skin  hair loss  headache  loss of appetite  sensitivity of eyes to the light  stomach upset  unusually teary eyes This list may not describe all possible side effects. Call your doctor for medical advice about side effects. You may report side effects to FDA at 1-800-FDA-1088. Where should I keep my medicine? This  drug is given in a hospital or clinic and will not be stored at home. NOTE: This sheet is a summary. It may not cover all possible information. If you have questions about this medicine, talk to your doctor, pharmacist, or health care provider.  2021 [employer] Standard (2019-04-18 15:00:03) Leucovorin injection What is this medicine? LEUCOVORIN (loo koe VOR in) is used to prevent or treat the harmful effects of some medicines. This medicine is used to treat anemia caused by a low amount of folic acid in the body. It is also used with 5-fluorouracil (5-FU) to treat colon cancer. This medicine may be used for other purposes; ask your health care provider or pharmacist if you have questions. What should I tell my health care provider before I take this medicine? They need to know if you have any of these conditions:  anemia from low levels of vitamin B-12 in the blood  an unusual or allergic reaction to leucovorin, folic acid, other medicines, foods, dyes, or preservatives  pregnant or trying to get pregnant  breast-feeding How should I use this medicine? This medicine is for injection into a muscle or into a vein. It is given by a health care professional in a hospital or clinic setting. Talk to your pediatrician   regarding the use of this medicine in children. Special care may be needed. Overdosage: If you think you have taken too much of this medicine contact a poison control center or emergency room at once. NOTE: This medicine is only for you. Do not share this medicine with others. What if I miss a dose? This does not apply. What may interact with this medicine?  capecitabine  fluorouracil  phenobarbital  phenytoin  primidone  trimethoprim-sulfamethoxazole This list may not describe all possible interactions. Give your health care provider a list of all the medicines, herbs, non-prescription drugs, or dietary supplements you use. Also tell them if you smoke, drink alcohol,  or use illegal drugs. Some items may interact with your medicine. What should I watch for while using this medicine? Your condition will be monitored carefully while you are receiving this medicine. This medicine may increase the side effects of 5-fluorouracil, 5-FU. Tell your doctor or health care professional if you have diarrhea or mouth sores that do not get better or that get worse. What side effects may I notice from receiving this medicine? Side effects that you should report to your doctor or health care professional as soon as possible:  allergic reactions like skin rash, itching or hives, swelling of the face, lips, or tongue  breathing problems  fever, infection  mouth sores  unusual bleeding or bruising  unusually weak or tired Side effects that usually do not require medical attention (report to your doctor or health care professional if they continue or are bothersome):  constipation or diarrhea  loss of appetite  nausea, vomiting This list may not describe all possible side effects. Call your doctor for medical advice about side effects. You may report side effects to FDA at 1-800-FDA-1088. Where should I keep my medicine? This drug is given in a hospital or clinic and will not be stored at home. NOTE: This sheet is a summary. It may not cover all possible information. If you have questions about this medicine, talk to your doctor, pharmacist, or health care provider.  2021 [employer] Standard (2007-11-22 16:50:29) Bevacizumab injection What is this medicine? BEVACIZUMAB (be va SIZ yoo mab) is a monoclonal antibody. It is used to treat many types of cancer. This medicine may be used for other purposes; ask your health care provider or pharmacist if you have questions. COMMON BRAND NAME(S): Avastin, MVASI, Zirabev What should I tell my health care provider before I take this medicine? They need to know if you have any of these conditions:  diabetes  heart  disease  high blood pressure  history of coughing up blood  prior anthracycline chemotherapy (e.g., doxorubicin, daunorubicin, epirubicin)  recent or ongoing radiation therapy  recent or planning to have surgery  stroke  an unusual or allergic reaction to bevacizumab, hamster proteins, mouse proteins, other medicines, foods, dyes, or preservatives  pregnant or trying to get pregnant  breast-feeding How should I use this medicine? This medicine is for infusion into a vein. It is given by a health care professional in a hospital or clinic setting. Talk to your pediatrician regarding the use of this medicine in children. Special care may be needed. Overdosage: If you think you have taken too much of this medicine contact a poison control center or emergency room at once. NOTE: This medicine is only for you. Do not share this medicine with others. What if I miss a dose? It is important not to miss your dose. Call your doctor or health care professional  if you are unable to keep an appointment. What may interact with this medicine? Interactions are not expected. This list may not describe all possible interactions. Give your health care provider a list of all the medicines, herbs, non-prescription drugs, or dietary supplements you use. Also tell them if you smoke, drink alcohol, or use illegal drugs. Some items may interact with your medicine. What should I watch for while using this medicine? Your condition will be monitored carefully while you are receiving this medicine. You will need important blood work and urine testing done while you are taking this medicine. This medicine may increase your risk to bruise or bleed. Call your doctor or health care professional if you notice any unusual bleeding. Before having surgery, talk to your health care provider to make sure it is ok. This drug can increase the risk of poor healing of your surgical site or wound. You will need to stop this drug  for 28 days before surgery. After surgery, wait at least 28 days before restarting this drug. Make sure the surgical site or wound is healed enough before restarting this drug. Talk to your health care provider if questions. Do not become pregnant while taking this medicine or for 6 months after stopping it. Women should inform their doctor if they wish to become pregnant or think they might be pregnant. There is a potential for serious side effects to an unborn child. Talk to your health care professional or pharmacist for more information. Do not breast-feed an infant while taking this medicine and for 6 months after the last dose. This medicine has caused ovarian failure in some women. This medicine may interfere with the ability to have a child. You should talk to your doctor or health care professional if you are concerned about your fertility. What side effects may I notice from receiving this medicine? Side effects that you should report to your doctor or health care professional as soon as possible:  allergic reactions like skin rash, itching or hives, swelling of the face, lips, or tongue  chest pain or chest tightness  chills  coughing up blood  high fever  seizures  severe constipation  signs and symptoms of bleeding such as bloody or black, tarry stools; red or dark-brown urine; spitting up blood or brown material that looks like coffee grounds; red spots on the skin; unusual bruising or bleeding from the eye, gums, or nose  signs and symptoms of a blood clot such as breathing problems; chest pain; severe, sudden headache; pain, swelling, warmth in the leg  signs and symptoms of a stroke like changes in vision; confusion; trouble speaking or understanding; severe headaches; sudden numbness or weakness of the face, arm or leg; trouble walking; dizziness; loss of balance or coordination  stomach pain  sweating  swelling of legs or ankles  vomiting  weight gain Side  effects that usually do not require medical attention (report to your doctor or health care professional if they continue or are bothersome):  back pain  changes in taste  decreased appetite  dry skin  nausea  tiredness This list may not describe all possible side effects. Call your doctor for medical advice about side effects. You may report side effects to FDA at 1-800-FDA-1088. Where should I keep my medicine? This drug is given in a hospital or clinic and will not be stored at home. NOTE: This sheet is a summary. It may not cover all possible information. If you have questions about this medicine,   talk to your doctor, pharmacist, or health care provider.  2021 [employer] Standard (2019-03-15 10:50:46) Irinotecan injection What is this medicine? IRINOTECAN (ir in oh TEE kan ) is a chemotherapy drug. It is used to treat colon and rectal cancer. This medicine may be used for other purposes; ask your health care provider or pharmacist if you have questions. COMMON BRAND NAME(S): Camptosar What should I tell my health care provider before I take this medicine? They need to know if you have any of these conditions:  dehydration  diarrhea  infection (especially a virus infection such as chickenpox, cold sores, or herpes)  liver disease  low blood counts, like low white cell, platelet, or red cell counts  low levels of calcium, magnesium, or potassium in the blood  recent or ongoing radiation therapy  an unusual or allergic reaction to irinotecan, other medicines, foods, dyes, or preservatives  pregnant or trying to get pregnant  breast-feeding How should I use this medicine? This drug is given as an infusion into a vein. It is administered in a hospital or clinic by a specially trained health care professional. Talk to your pediatrician regarding the use of this medicine in children. Special care may be needed. Overdosage: If you think you have taken too much of this  medicine contact a poison control center or emergency room at once. NOTE: This medicine is only for you. Do not share this medicine with others. What if I miss a dose? It is important not to miss your dose. Call your doctor or health care professional if you are unable to keep an appointment. What may interact with this medicine? Do not take this medicine with any of the following medications:  cobicistat  itraconazole This medicine may interact with the following medications:  antiviral medicines for HIV or AIDS  certain antibiotics like rifampin or rifabutin  certain medicines for fungal infections like ketoconazole, posaconazole, and voriconazole  certain medicines for seizures like carbamazepine, phenobarbital, phenotoin  clarithromycin  gemfibrozil  nefazodone  St. John's Wort This list may not describe all possible interactions. Give your health care provider a list of all the medicines, herbs, non-prescription drugs, or dietary supplements you use. Also tell them if you smoke, drink alcohol, or use illegal drugs. Some items may interact with your medicine. What should I watch for while using this medicine? Your condition will be monitored carefully while you are receiving this medicine. You will need important blood work done while you are taking this medicine. This drug may make you feel generally unwell. This is not uncommon, as chemotherapy can affect healthy cells as well as cancer cells. Report any side effects. Continue your course of treatment even though you feel ill unless your doctor tells you to stop. In some cases, you may be given additional medicines to help with side effects. Follow all directions for their use. You may get drowsy or dizzy. Do not drive, use machinery, or do anything that needs mental alertness until you know how this medicine affects you. Do not stand or sit up quickly, especially if you are an older patient. This reduces the risk of dizzy or  fainting spells. Call your health care professional for advice if you get a fever, chills, or sore throat, or other symptoms of a cold or flu. Do not treat yourself. This medicine decreases your body's ability to fight infections. Try to avoid being around people who are sick. Avoid taking products that contain aspirin, acetaminophen, ibuprofen, naproxen, or ketoprofen unless  instructed by your doctor. These medicines may hide a fever. This medicine may increase your risk to bruise or bleed. Call your doctor or health care professional if you notice any unusual bleeding. Be careful brushing and flossing your teeth or using a toothpick because you may get an infection or bleed more easily. If you have any dental work done, tell your dentist you are receiving this medicine. Do not become pregnant while taking this medicine or for 6 months after stopping it. Women should inform their health care professional if they wish to become pregnant or think they might be pregnant. Men should not father a child while taking this medicine and for 3 months after stopping it. There is potential for serious side effects to an unborn child. Talk to your health care professional for more information. Do not breast-feed an infant while taking this medicine or for 7 days after stopping it. This medicine has caused ovarian failure in some women. This medicine may make it more difficult to get pregnant. Talk to your health care professional if you are concerned about your fertility. This medicine has caused decreased sperm counts in some men. This may make it more difficult to father a child. Talk to your health care professional if you are concerned about your fertility. What side effects may I notice from receiving this medicine? Side effects that you should report to your doctor or health care professional as soon as possible:  allergic reactions like skin rash, itching or hives, swelling of the face, lips, or  tongue  chest pain  diarrhea  flushing, runny nose, sweating during infusion  low blood counts - this medicine may decrease the number of white blood cells, red blood cells and platelets. You may be at increased risk for infections and bleeding.  nausea, vomiting  pain, swelling, warmth in the leg  signs of decreased platelets or bleeding - bruising, pinpoint red spots on the skin, black, tarry stools, blood in the urine  signs of infection - fever or chills, cough, sore throat, pain or difficulty passing urine  signs of decreased red blood cells - unusually weak or tired, fainting spells, lightheadedness Side effects that usually do not require medical attention (report to your doctor or health care professional if they continue or are bothersome):  constipation  hair loss  headache  loss of appetite  mouth sores  stomach pain This list may not describe all possible side effects. Call your doctor for medical advice about side effects. You may report side effects to FDA at 1-800-FDA-1088. Where should I keep my medicine? This drug is given in a hospital or clinic and will not be stored at home. NOTE: This sheet is a summary. It may not cover all possible information. If you have questions about this medicine, talk to your doctor, pharmacist, or health care provider.  2021 [employer] Standard (2019-04-18 17:46:13)

## 2020-08-21 ENCOUNTER — Inpatient Hospital Stay: Payer: PRIVATE HEALTH INSURANCE

## 2020-08-21 ENCOUNTER — Other Ambulatory Visit: Payer: Self-pay

## 2020-08-21 VITALS — BP 112/70 | HR 112 | Temp 97.9°F | Resp 18 | Ht 68.0 in | Wt 201.6 lb

## 2020-08-21 DIAGNOSIS — Z5112 Encounter for antineoplastic immunotherapy: Secondary | ICD-10-CM | POA: Diagnosis not present

## 2020-08-21 DIAGNOSIS — C182 Malignant neoplasm of ascending colon: Secondary | ICD-10-CM

## 2020-08-21 MED ORDER — HEPARIN SOD (PORK) LOCK FLUSH 100 UNIT/ML IV SOLN
500.0000 [IU] | Freq: Once | INTRAVENOUS | Status: AC | PRN
  Administered 2020-08-21: 500 [IU]
  Filled 2020-08-21: qty 5

## 2020-08-21 MED ORDER — SODIUM CHLORIDE 0.9% FLUSH
10.0000 mL | INTRAVENOUS | Status: DC | PRN
  Administered 2020-08-21: 10 mL
  Filled 2020-08-21: qty 10

## 2020-08-21 NOTE — Patient Instructions (Signed)
Fluorouracil, 5-FU injection What is this medicine? FLUOROURACIL, 5-FU (flure oh YOOR a sil) is a chemotherapy drug. It slows the growth of cancer cells. This medicine is used to treat many types of cancer like breast cancer, colon or rectal cancer, pancreatic cancer, and stomach cancer. This medicine may be used for other purposes; ask your health care provider or pharmacist if you have questions. COMMON BRAND NAME(S): Adrucil What should I tell my health care provider before I take this medicine? They need to know if you have any of these conditions:  blood disorders  dihydropyrimidine dehydrogenase (DPD) deficiency  infection (especially a virus infection such as chickenpox, cold sores, or herpes)  kidney disease  liver disease  malnourished, poor nutrition  recent or ongoing radiation therapy  an unusual or allergic reaction to fluorouracil, other chemotherapy, other medicines, foods, dyes, or preservatives  pregnant or trying to get pregnant  breast-feeding How should I use this medicine? This drug is given as an infusion or injection into a vein. It is administered in a hospital or clinic by a specially trained health care professional. Talk to your pediatrician regarding the use of this medicine in children. Special care may be needed. Overdosage: If you think you have taken too much of this medicine contact a poison control center or emergency room at once. NOTE: This medicine is only for you. Do not share this medicine with others. What if I miss a dose? It is important not to miss your dose. Call your doctor or health care professional if you are unable to keep an appointment. What may interact with this medicine? Do not take this medicine with any of the following medications:  live virus vaccines This medicine may also interact with the following medications:  medicines that treat or prevent blood clots like warfarin, enoxaparin, and dalteparin This list may not  describe all possible interactions. Give your health care provider a list of all the medicines, herbs, non-prescription drugs, or dietary supplements you use. Also tell them if you smoke, drink alcohol, or use illegal drugs. Some items may interact with your medicine. What should I watch for while using this medicine? Visit your doctor for checks on your progress. This drug may make you feel generally unwell. This is not uncommon, as chemotherapy can affect healthy cells as well as cancer cells. Report any side effects. Continue your course of treatment even though you feel ill unless your doctor tells you to stop. In some cases, you may be given additional medicines to help with side effects. Follow all directions for their use. Call your doctor or health care professional for advice if you get a fever, chills or sore throat, or other symptoms of a cold or flu. Do not treat yourself. This drug decreases your body's ability to fight infections. Try to avoid being around people who are sick. This medicine may increase your risk to bruise or bleed. Call your doctor or health care professional if you notice any unusual bleeding. Be careful brushing and flossing your teeth or using a toothpick because you may get an infection or bleed more easily. If you have any dental work done, tell your dentist you are receiving this medicine. Avoid taking products that contain aspirin, acetaminophen, ibuprofen, naproxen, or ketoprofen unless instructed by your doctor. These medicines may hide a fever. Do not become pregnant while taking this medicine. Women should inform their doctor if they wish to become pregnant or think they might be pregnant. There is a potential   for serious side effects to an unborn child. Talk to your health care professional or pharmacist for more information. Do not breast-feed an infant while taking this medicine. Men should inform their doctor if they wish to father a child. This medicine may  lower sperm counts. Do not treat diarrhea with over the counter products. Contact your doctor if you have diarrhea that lasts more than 2 days or if it is severe and watery. This medicine can make you more sensitive to the sun. Keep out of the sun. If you cannot avoid being in the sun, wear protective clothing and use sunscreen. Do not use sun lamps or tanning beds/booths. What side effects may I notice from receiving this medicine? Side effects that you should report to your doctor or health care professional as soon as possible:  allergic reactions like skin rash, itching or hives, swelling of the face, lips, or tongue  low blood counts - this medicine may decrease the number of white blood cells, red blood cells and platelets. You may be at increased risk for infections and bleeding.  signs of infection - fever or chills, cough, sore throat, pain or difficulty passing urine  signs of decreased platelets or bleeding - bruising, pinpoint red spots on the skin, black, tarry stools, blood in the urine  signs of decreased red blood cells - unusually weak or tired, fainting spells, lightheadedness  breathing problems  changes in vision  chest pain  mouth sores  nausea and vomiting  pain, swelling, redness at site where injected  pain, tingling, numbness in the hands or feet  redness, swelling, or sores on hands or feet  stomach pain  unusual bleeding Side effects that usually do not require medical attention (report to your doctor or health care professional if they continue or are bothersome):  changes in finger or toe nails  diarrhea  dry or itchy skin  hair loss  headache  loss of appetite  sensitivity of eyes to the light  stomach upset  unusually teary eyes This list may not describe all possible side effects. Call your doctor for medical advice about side effects. You may report side effects to FDA at 1-800-FDA-1088. Where should I keep my medicine? This  drug is given in a hospital or clinic and will not be stored at home. NOTE: This sheet is a summary. It may not cover all possible information. If you have questions about this medicine, talk to your doctor, pharmacist, or health care provider.  2021 [employer] (2019-04-18 15:00:03)

## 2020-08-26 ENCOUNTER — Encounter
Payer: PRIVATE HEALTH INSURANCE | Attending: Physical Medicine & Rehabilitation | Admitting: Physical Medicine & Rehabilitation

## 2020-08-26 ENCOUNTER — Encounter: Payer: Self-pay | Admitting: Physical Medicine & Rehabilitation

## 2020-08-26 ENCOUNTER — Other Ambulatory Visit: Payer: Self-pay

## 2020-08-26 VITALS — BP 114/78 | HR 103 | Temp 97.8°F | Ht 68.0 in | Wt 205.0 lb

## 2020-08-26 DIAGNOSIS — C7931 Secondary malignant neoplasm of brain: Secondary | ICD-10-CM | POA: Diagnosis present

## 2020-08-26 DIAGNOSIS — I951 Orthostatic hypotension: Secondary | ICD-10-CM | POA: Diagnosis not present

## 2020-08-26 DIAGNOSIS — G441 Vascular headache, not elsewhere classified: Secondary | ICD-10-CM | POA: Diagnosis not present

## 2020-08-26 DIAGNOSIS — G9389 Other specified disorders of brain: Secondary | ICD-10-CM | POA: Diagnosis not present

## 2020-08-26 NOTE — Progress Notes (Signed)
Subjective:    Patient ID: Shannon [name], female    DOB: [birth year], 55 y.o.   MRN: [id number]  HPI Female with history of anxiety depression as well as OCD maintained on Luvox, diabetes mellitus obesity with BMI 31.32 colon cancer 2018 with resection followed by chemotherapy presents for hospital follow up after receiving CIR for left cerebellar metastatic adenocarcinoma of colon s/p suboccipital craniectomy on 05/29/2020.    Last clinic visit on 06/25/2020.  Mother supplements history. Since that time, patient states she a repeat CT scan, showing cancer in lungs and abdomen.  She was started on Chemo again. She is in outpatient PT. She states she has not had headaches. CBGs are elevated for the most part, managed by PCP now.  Orthostatic symptoms have been stable. Denies falls.  Pain Inventory Average Pain 1 Pain Right Now 0 My pain is intermittent, constant, dull and aching  LOCATION OF PAIN  Head  BOWEL Number of stools per week: 7-10 Oral laxative use No  Type of laxative pills Enema or suppository use No  History of colostomy No  Incontinent No   BLADDER Normal In and out cath, frequency N/a Able to self cath No  Bladder incontinence No  Frequent urination Yes, with cancer treatment Leakage with coughing Yes  Difficulty starting stream Yes  Incomplete bladder emptying Yes    Mobility ability to climb steps?  yes do you drive?  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  yes  Neuro/Psych bladder control problems weakness dizziness confusion depression anxiety loss of taste or smell suicidal thoughts  Prior Studies Any changes since last visit?  yes CT scan [facility] - chest & abdomen  Physicians involved in your care Any changes since last visit?  no   Family History  Problem Relation Age of  Onset  . Irritable bowel syndrome Mother   . Colon polyps Father   . Breast cancer Maternal Grandmother   . Diabetes Maternal Grandmother   . Diabetes Maternal Grandfather   . Breast cancer Paternal Grandmother   . Colon polyps Maternal Uncle   . Stomach cancer Neg Hx   . Pancreatic cancer Neg Hx   . Esophageal cancer Neg Hx   . Colon cancer Neg Hx    Social History   Socioeconomic History  . Marital status: Widowed    Spouse name: Not on file  . Number of children: Not on file  . Years of education: Not on file  . Highest education level: Not on file  Occupational History  . Occupation: care coordinator   Tobacco Use  . Smoking status: Never Smoker  . Smokeless tobacco: Never Used  Vaping Use  . Vaping Use: Never used  Substance and Sexual Activity  . Alcohol use: Yes    Comment: rare  . Drug use: Never  . Sexual activity: Not Currently  Other Topics Concern  . Not on file  Social History Narrative  . Not on file   Social Determinants of Health   Financial Resource Strain: Not on file  Food Insecurity: Not on file  Transportation Needs: Not on file  Physical Activity: Not on file  Stress: Not on file  Social Connections: Not on file   Past Surgical History:  Procedure Laterality Date  . ABDOMINAL HYSTERECTOMY  05/2019   total  laparoscopin with tubes and ovariers removed also.  . APPENDECTOMY  05/2019  . COLON SURGERY  03/2017   Colon resection  . COLONOSCOPY  2019  . SUBOCCIPITAL CRANIECTOMY CERVICAL LAMINECTOMY Left 05/29/2020   Procedure: Suboccipital Craniectomy for Resection of Left Cerebellar Mass;  Surgeon: Kary Kos, MD;  Location: [facility];  Service: Neurosurgery;  Laterality: Left;   Past Medical History:  Diagnosis Date  . Anemia    [place]  . Anxiety   . Blood clot in vein   . Bowel obstruction ([place])   . Colon cancer ([place]) 2018   treated with surgery and chemotherapy  . Depression   . Diabetes mellitus without complication ([place])    Type  II  . DVT (deep venous thrombosis) ([place]) 2019   behind right knee- while she wasa on chemo  . GERD (gastroesophageal reflux disease)   . History of blood transfusion   . History of chemotherapy   . History of kidney stones 2012   passed  . Hypothyroidism   . Obesity    BP 114/78   Pulse (!) 103   Temp 97.8 F (36.6 C)   Ht 5\' 8"  (1.727 m)   Wt 205 lb (93 kg)   LMP 02/22/2019 (Approximate)   SpO2 97%   BMI 31.17 kg/m   Opioid Risk Score:   Fall Risk Score:  `1  Depression screen PHQ 2/9  Depression screen PHQ 2/9 06/25/2020  Decreased Interest 1  Down, Depressed, Hopeless 1  PHQ - 2 Score 2  Altered sleeping 2  Tired, decreased energy 2  Change in appetite 3  Feeling bad or failure about yourself  3  Trouble concentrating 3  Moving slowly or fidgety/restless 2  Suicidal thoughts 2  PHQ-9 Score 19    Review of Systems  Eyes: Positive for visual disturbance.  Genitourinary: Positive for dysuria.       Retention  Neurological: Positive for dizziness and weakness. Negative for headaches.  Psychiatric/Behavioral: Positive for confusion and suicidal ideas (NO PLAN).  All other systems reviewed and are negative.     Objective:   Physical Exam  Constitutional: No distress . Vital signs reviewed. HENT: Normocephalic.  Atraumatic. Eyes: EOMI. No discharge. Cardiovascular: No JVD.   Respiratory: Normal effort.  No stridor.   GI: Non-distended.   Skin: Warm and dry.  Intact. Psych: Normal mood.  Normal behavior. Musc: No edema in extremities.  No tenderness in extremities. 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     1. Dizziness with blurred vision and decreased functional mobility secondary to left cerebellar intra-axial lesion path + for metastatic adenocarcinoma of colon, now with abdoment and lungs as well. Status post suboccipital craniectomy resection of cerebellar mass 05/29/2020.    Cont therapies  Cont to follow up with Neuro/Onc  Cont Chemo   2. Pain Management - headache:   Improving  Will decrease Topamax to 25 BID x1 week, then d/c  3. Orthostatic hypotension  Encouraged fluid intake - 64 oz daily  Improving

## 2020-08-29 NOTE — Progress Notes (Signed)
[facility]  [address] [place],  [place]  [id number] [phone]  Clinic Day:  08/30/2020  Referring physician: Greig Right, MD   HISTORY OF PRESENT ILLNESS:  The patient is a 55 y.o. female with metastatic colon cancer, which includes spread of disease to her abdominal cavity, lungs, and right adrenal gland. She also had a left cerebellar metastatectomy.  She comes in today to be evauated before heaidng into her 3rd cycle of FOLFIRI/Avastin.  The patient claims to have toleraed her 2nd cycle of treatment fairly well.  She did have some fatigue, but denies having other significant side effects.  She denies having any new GI or other systemic symptoms which concern her for disease progression.  With respect to her colon cancer history, she is status post a right hemicolectomy in early October 2018, followed by 12 cycles of adjuvant chemotherapy, which were completed in April 2019.  In December 2021, she underwent a left cerebellar metastatectomy, whose pathology was consistent with metastatic colon cancer.  Tumor testing did come back MMR normal.  CT scans recently showed evidence of her cancer being in multiple locations, which has her on palliative chemotherapy at this time.    PHYSICAL EXAM:  Blood pressure 119/78, pulse (!) 109, temperature 97.8 F (36.6 C), resp. rate 16, height 5' 8"  (1.727 m), last menstrual period 02/22/2019, SpO2 95 %. Wt Readings from Last 3 Encounters:  09/05/20 208 lb 1.9 oz (94.4 kg)  09/03/20 206 lb 0.6 oz (93.5 kg)  08/26/20 205 lb (93 kg)   Body mass index is 31.17 kg/m. Performance status (ECOG): 0 - Asymptomatic Physical Exam Constitutional:      Appearance: Normal appearance. She is not ill-appearing.  HENT:     Mouth/Throat:     Mouth: Mucous membranes are moist.     Pharynx: Oropharynx is clear. No oropharyngeal exudate or posterior oropharyngeal erythema.  Cardiovascular:     Rate and Rhythm: Normal rate  and regular rhythm.     Heart sounds: No murmur heard. No friction rub. No gallop.   Pulmonary:     Effort: Pulmonary effort is normal. No respiratory distress.     Breath sounds: Normal breath sounds. No wheezing, rhonchi or rales.  Chest:  Breasts:     Right: No axillary adenopathy or supraclavicular adenopathy.     Left: No axillary adenopathy or supraclavicular adenopathy.    Abdominal:     General: Bowel sounds are normal. There is no distension.     Palpations: Abdomen is soft. There is no mass.     Tenderness: There is no abdominal tenderness.  Musculoskeletal:        General: No swelling.     Right lower leg: No edema.     Left lower leg: No edema.  Lymphadenopathy:     Cervical: No cervical adenopathy.     Upper Body:     Right upper body: No supraclavicular or axillary adenopathy.     Left upper body: No supraclavicular or axillary adenopathy.     Lower Body: No right inguinal adenopathy. No left inguinal adenopathy.  Skin:    General: Skin is warm.     Coloration: Skin is not jaundiced.     Findings: No lesion or rash.  Neurological:     General: No focal deficit present.     Mental Status: She is alert and oriented to person, place, and time. Mental status is at baseline.     Cranial Nerves: Cranial  nerves are intact.  Psychiatric:        Mood and Affect: Mood normal.        Behavior: Behavior normal.        Thought Content: Thought content normal.    LABS:    Ref. Range 08/30/2020 00:00  Sodium Latest Ref Range: 137 - 147  140  Potassium Latest Ref Range: 3.4 - 5.3  3.1 (A)  Chloride Latest Ref Range: 99 - 108  109 (A)  CO2 Latest Ref Range: 13 - 22  21  Glucose Unknown 196  BUN Latest Ref Range: 4 - 21  9  Creatinine Latest Ref Range: 0.5 - 1.1  1.2 (A)  Calcium Latest Ref Range: 8.7 - 10.7  8.7  Alkaline Phosphatase Latest Ref Range: 25 - 125  128 (A)  Albumin Latest Ref Range: 3.5 - 5.0  4.0  AST Latest Ref Range: 13 - 35  27  ALT Latest Ref Range:  7 - 35  28  Bilirubin, Total Unknown 0.4  WBC Unknown 2.5  RBC Latest Ref Range: 3.87 - 5.11  3.68 (A)  Hemoglobin Latest Ref Range: 12.0 - 16.0  11.9 (A)  HCT Latest Ref Range: 36 - 46  35 (A)  Platelets Latest Ref Range: 150 - 399  167  NEUT# Unknown 1.05   ASSESSMENT & PLAN:  Assessment/Plan:  A 55 y.o. female with metastatic colon cancer.  She will proceed with her 3rd cycle of FOLFIRI/Avastin next week.  As her white count is dropping, we will provide her growth factor support to prevent her from developing severe neutropenia, which can delay her future cycles of palliative chemotherapy.  Otherwise, the patient clinically appears to be doing well.   I will see her back in 2 weeks before she heads into her 4th cycle of FOLFIRI/Avastin.  The patient understands all the plans discussed today and is in agreement with them.    Xaniyah Buchholz Macarthur Critchley, MD

## 2020-08-30 ENCOUNTER — Other Ambulatory Visit: Payer: Self-pay

## 2020-08-30 ENCOUNTER — Inpatient Hospital Stay (INDEPENDENT_AMBULATORY_CARE_PROVIDER_SITE_OTHER): Payer: PRIVATE HEALTH INSURANCE | Admitting: Oncology

## 2020-08-30 ENCOUNTER — Other Ambulatory Visit: Payer: Self-pay | Admitting: Hematology and Oncology

## 2020-08-30 ENCOUNTER — Inpatient Hospital Stay: Payer: PRIVATE HEALTH INSURANCE | Attending: Oncology

## 2020-08-30 VITALS — BP 119/78 | HR 109 | Temp 97.8°F | Resp 16 | Ht 68.0 in

## 2020-08-30 DIAGNOSIS — R197 Diarrhea, unspecified: Secondary | ICD-10-CM | POA: Diagnosis not present

## 2020-08-30 DIAGNOSIS — Z5111 Encounter for antineoplastic chemotherapy: Secondary | ICD-10-CM | POA: Insufficient documentation

## 2020-08-30 DIAGNOSIS — D709 Neutropenia, unspecified: Secondary | ICD-10-CM | POA: Diagnosis not present

## 2020-08-30 DIAGNOSIS — R5383 Other fatigue: Secondary | ICD-10-CM | POA: Insufficient documentation

## 2020-08-30 DIAGNOSIS — C182 Malignant neoplasm of ascending colon: Secondary | ICD-10-CM

## 2020-08-30 DIAGNOSIS — Z5112 Encounter for antineoplastic immunotherapy: Secondary | ICD-10-CM | POA: Insufficient documentation

## 2020-08-30 DIAGNOSIS — C7931 Secondary malignant neoplasm of brain: Secondary | ICD-10-CM | POA: Insufficient documentation

## 2020-08-30 DIAGNOSIS — C189 Malignant neoplasm of colon, unspecified: Secondary | ICD-10-CM | POA: Diagnosis present

## 2020-08-30 DIAGNOSIS — Z79899 Other long term (current) drug therapy: Secondary | ICD-10-CM | POA: Diagnosis not present

## 2020-08-30 LAB — HEPATIC FUNCTION PANEL
ALT: 28 (ref 7–35)
AST: 27 (ref 13–35)
Alkaline Phosphatase: 128 — AB (ref 25–125)
Bilirubin, Total: 0.4

## 2020-08-30 LAB — CBC AND DIFFERENTIAL
HCT: 35 — AB (ref 36–46)
Hemoglobin: 11.9 — AB (ref 12.0–16.0)
Neutrophils Absolute: 1.05
Platelets: 167 (ref 150–399)
WBC: 2.5

## 2020-08-30 LAB — CBC: RBC: 3.68 — AB (ref 3.87–5.11)

## 2020-08-30 LAB — COMPREHENSIVE METABOLIC PANEL
Albumin: 4 (ref 3.5–5.0)
Calcium: 8.7 (ref 8.7–10.7)

## 2020-08-30 LAB — BASIC METABOLIC PANEL
BUN: 9 (ref 4–21)
CO2: 21 (ref 13–22)
Chloride: 109 — AB (ref 99–108)
Creatinine: 1.2 — AB (ref 0.5–1.1)
Glucose: 196
Potassium: 3.1 — AB (ref 3.4–5.3)
Sodium: 140 (ref 137–147)

## 2020-08-30 LAB — TOTAL PROTEIN, URINE DIPSTICK: Protein, ur: NEGATIVE mg/dL

## 2020-08-30 NOTE — Progress Notes (Signed)
Holding bevacizumab on 09/03/20 due to root canal procedure.  Date of procedure TBD but will be in the next 2 weeks.

## 2020-09-03 ENCOUNTER — Inpatient Hospital Stay: Payer: PRIVATE HEALTH INSURANCE

## 2020-09-03 ENCOUNTER — Other Ambulatory Visit: Payer: Self-pay

## 2020-09-03 VITALS — BP 111/79 | HR 106 | Temp 98.0°F | Resp 18 | Ht 68.0 in | Wt 206.0 lb

## 2020-09-03 DIAGNOSIS — C182 Malignant neoplasm of ascending colon: Secondary | ICD-10-CM

## 2020-09-03 DIAGNOSIS — Z5112 Encounter for antineoplastic immunotherapy: Secondary | ICD-10-CM | POA: Diagnosis not present

## 2020-09-03 MED ORDER — PALONOSETRON HCL INJECTION 0.25 MG/5ML
INTRAVENOUS | Status: AC
  Filled 2020-09-03: qty 5

## 2020-09-03 MED ORDER — PALONOSETRON HCL INJECTION 0.25 MG/5ML
0.2500 mg | Freq: Once | INTRAVENOUS | Status: AC
  Administered 2020-09-03: 0.25 mg via INTRAVENOUS

## 2020-09-03 MED ORDER — SODIUM CHLORIDE 0.9 % IV SOLN
2400.0000 mg/m2 | INTRAVENOUS | Status: DC
  Administered 2020-09-03: 5000 mg via INTRAVENOUS
  Filled 2020-09-03: qty 100

## 2020-09-03 MED ORDER — LEUCOVORIN CALCIUM INJECTION 350 MG
400.0000 mg/m2 | Freq: Once | INTRAVENOUS | Status: AC
  Administered 2020-09-03: 836 mg via INTRAVENOUS
  Filled 2020-09-03: qty 25

## 2020-09-03 MED ORDER — SODIUM CHLORIDE 0.9 % IV SOLN
10.0000 mg | Freq: Once | INTRAVENOUS | Status: AC
  Administered 2020-09-03: 10 mg via INTRAVENOUS
  Filled 2020-09-03: qty 10

## 2020-09-03 MED ORDER — FLUOROURACIL CHEMO INJECTION 2.5 GM/50ML
400.0000 mg/m2 | Freq: Once | INTRAVENOUS | Status: AC
  Administered 2020-09-03: 850 mg via INTRAVENOUS
  Filled 2020-09-03: qty 17

## 2020-09-03 MED ORDER — SODIUM CHLORIDE 0.9 % IV SOLN
Freq: Once | INTRAVENOUS | Status: AC
  Filled 2020-09-03: qty 250

## 2020-09-03 MED ORDER — IRINOTECAN HCL CHEMO INJECTION 100 MG/5ML
180.0000 mg/m2 | Freq: Once | INTRAVENOUS | Status: AC
  Administered 2020-09-03: 380 mg via INTRAVENOUS
  Filled 2020-09-03: qty 15

## 2020-09-03 MED ORDER — ATROPINE SULFATE 1 MG/ML IJ SOLN
INTRAMUSCULAR | Status: AC
  Filled 2020-09-03: qty 1

## 2020-09-03 MED ORDER — POTASSIUM CHLORIDE 10 MEQ/100ML IV SOLN
INTRAVENOUS | Status: AC
  Filled 2020-09-03: qty 200

## 2020-09-03 MED ORDER — HEPARIN SOD (PORK) LOCK FLUSH 100 UNIT/ML IV SOLN
500.0000 [IU] | Freq: Once | INTRAVENOUS | Status: DC | PRN
  Filled 2020-09-03: qty 5

## 2020-09-03 MED ORDER — ATROPINE SULFATE 1 MG/ML IJ SOLN
0.5000 mg | Freq: Once | INTRAMUSCULAR | Status: AC | PRN
  Administered 2020-09-03: 0.5 mg via INTRAVENOUS

## 2020-09-03 MED ORDER — POTASSIUM CHLORIDE 10 MEQ/100ML IV SOLN
10.0000 meq | INTRAVENOUS | Status: AC
  Administered 2020-09-03 (×2): 10 meq via INTRAVENOUS

## 2020-09-03 NOTE — Progress Notes (Signed)
Pt d/c stable at 1445

## 2020-09-03 NOTE — Patient Instructions (Signed)
[facility] Discharge Instructions for Patients Receiving Chemotherapy  Today you received the following chemotherapy agents Adrucil, camptosar  To help prevent nausea and vomiting after your treatment, we encourage you to take your nausea medication as directed   If you develop nausea and vomiting that is not controlled by your nausea medication, call the clinic.   BELOW ARE SYMPTOMS THAT SHOULD BE REPORTED IMMEDIATELY:  *FEVER GREATER THAN 100.5 F  *CHILLS WITH OR WITHOUT FEVER  NAUSEA AND VOMITING THAT IS NOT CONTROLLED WITH YOUR NAUSEA MEDICATION  *UNUSUAL SHORTNESS OF BREATH  *UNUSUAL BRUISING OR BLEEDING  TENDERNESS IN MOUTH AND THROAT WITH OR WITHOUT PRESENCE OF ULCERS  *URINARY PROBLEMS  *BOWEL PROBLEMS  UNUSUAL RASH Items with * indicate a potential emergency and should be followed up as soon as possible.  Feel free to call the clinic should you have any questions or concerns at The clinic phone number is [phone].  Please show the [facility] at check-in to the Emergency Department and triage nurse.  [facility] Discharge Instructions for Patients receiving Home Portable Chemo Pump    **The bag should finish at 46 hours, 96 hours or 7 days. For example, if your pump is scheduled for 46 hours and it was put on at 4pm, it should finish at 2 pm the day it is scheduled to come off regardless of your appointment time.    Estimated time to finish   _________________________ (Have your nurse fill in)     ** if the display on your pump reads "Low Volume" and it is beeping, take the batteries out of the pump and come to the cancer center for it to be taken off.   **If the pump alarms go off prior to the pump reading "Low Volume" then call the [phone] and someone can assist you.  **If the plunger comes out and the bag fluid is running out, please use your chemo spill kit to clean up the spill. Do not  use paper towels or other house hold products.  ** If you have problems or questions regarding your pump, please call either the [phone] or the cancer center Monday-Friday 8:00am-4:30pm at [phone] and we will assist you. Leucovorin injection What is this medicine? LEUCOVORIN (loo koe VOR in) is used to prevent or treat the harmful effects of some medicines. This medicine is used to treat anemia caused by a low amount of folic acid in the body. It is also used with 5-fluorouracil (5-FU) to treat colon cancer. This medicine may be used for other purposes; ask your health care provider or pharmacist if you have questions. What should I tell my health care provider before I take this medicine? They need to know if you have any of these conditions:  anemia from low levels of vitamin B-12 in the blood  an unusual or allergic reaction to leucovorin, folic acid, other medicines, foods, dyes, or preservatives  pregnant or trying to get pregnant  breast-feeding How should I use this medicine? This medicine is for injection into a muscle or into a vein. It is given by a health care professional in a hospital or clinic setting. Talk to your pediatrician regarding the use of this medicine in children. Special care may be needed. Overdosage: If you think you have taken too much of this medicine contact a poison control center or emergency room at once. NOTE: This medicine is only for you. Do not share this  medicine with others. What if I miss a dose? This does not apply. What may interact with this medicine?  capecitabine  fluorouracil  phenobarbital  phenytoin  primidone  trimethoprim-sulfamethoxazole This list may not describe all possible interactions. Give your health care provider a list of all the medicines, herbs, non-prescription drugs, or dietary supplements you use. Also tell them if you smoke, drink alcohol, or use illegal drugs. Some items may interact with your  medicine. What should I watch for while using this medicine? Your condition will be monitored carefully while you are receiving this medicine. This medicine may increase the side effects of 5-fluorouracil, 5-FU. Tell your doctor or health care professional if you have diarrhea or mouth sores that do not get better or that get worse. What side effects may I notice from receiving this medicine? Side effects that you should report to your doctor or health care professional as soon as possible:  allergic reactions like skin rash, itching or hives, swelling of the face, lips, or tongue  breathing problems  fever, infection  mouth sores  unusual bleeding or bruising  unusually weak or tired Side effects that usually do not require medical attention (report to your doctor or health care professional if they continue or are bothersome):  constipation or diarrhea  loss of appetite  nausea, vomiting This list may not describe all possible side effects. Call your doctor for medical advice about side effects. You may report side effects to FDA at 1-800-FDA-1088. Where should I keep my medicine? This drug is given in a hospital or clinic and will not be stored at home. NOTE: This sheet is a summary. It may not cover all possible information. If you have questions about this medicine, talk to your doctor, pharmacist, or health care provider.  2021 Elsevier/Gold Standard (2007-11-22 16:50:29) Irinotecan injection What is this medicine? IRINOTECAN (ir in oh TEE kan ) is a chemotherapy drug. It is used to treat colon and rectal cancer. This medicine may be used for other purposes; ask your health care provider or pharmacist if you have questions. COMMON BRAND NAME(S): Camptosar What should I tell my health care provider before I take this medicine? They need to know if you have any of these conditions:  dehydration  diarrhea  infection (especially a virus infection such as chickenpox, cold  sores, or herpes)  liver disease  low blood counts, like low white cell, platelet, or red cell counts  low levels of calcium, magnesium, or potassium in the blood  recent or ongoing radiation therapy  an unusual or allergic reaction to irinotecan, other medicines, foods, dyes, or preservatives  pregnant or trying to get pregnant  breast-feeding How should I use this medicine? This drug is given as an infusion into a vein. It is administered in a hospital or clinic by a specially trained health care professional. Talk to your pediatrician regarding the use of this medicine in children. Special care may be needed. Overdosage: If you think you have taken too much of this medicine contact a poison control center or emergency room at once. NOTE: This medicine is only for you. Do not share this medicine with others. What if I miss a dose? It is important not to miss your dose. Call your doctor or health care professional if you are unable to keep an appointment. What may interact with this medicine? Do not take this medicine with any of the following medications:  cobicistat  itraconazole This medicine may interact with the following  medications:  antiviral medicines for HIV or AIDS  certain antibiotics like rifampin or rifabutin  certain medicines for fungal infections like ketoconazole, posaconazole, and voriconazole  certain medicines for seizures like carbamazepine, phenobarbital, phenotoin  clarithromycin  gemfibrozil  nefazodone  St. John's Wort This list may not describe all possible interactions. Give your health care provider a list of all the medicines, herbs, non-prescription drugs, or dietary supplements you use. Also tell them if you smoke, drink alcohol, or use illegal drugs. Some items may interact with your medicine. What should I watch for while using this medicine? Your condition will be monitored carefully while you are receiving this medicine. You will  need important blood work done while you are taking this medicine. This drug may make you feel generally unwell. This is not uncommon, as chemotherapy can affect healthy cells as well as cancer cells. Report any side effects. Continue your course of treatment even though you feel ill unless your doctor tells you to stop. In some cases, you may be given additional medicines to help with side effects. Follow all directions for their use. You may get drowsy or dizzy. Do not drive, use machinery, or do anything that needs mental alertness until you know how this medicine affects you. Do not stand or sit up quickly, especially if you are an older patient. This reduces the risk of dizzy or fainting spells. Call your health care professional for advice if you get a fever, chills, or sore throat, or other symptoms of a cold or flu. Do not treat yourself. This medicine decreases your body's ability to fight infections. Try to avoid being around people who are sick. Avoid taking products that contain aspirin, acetaminophen, ibuprofen, naproxen, or ketoprofen unless instructed by your doctor. These medicines may hide a fever. This medicine may increase your risk to bruise or bleed. Call your doctor or health care professional if you notice any unusual bleeding. Be careful brushing and flossing your teeth or using a toothpick because you may get an infection or bleed more easily. If you have any dental work done, tell your dentist you are receiving this medicine. Do not become pregnant while taking this medicine or for 6 months after stopping it. Women should inform their health care professional if they wish to become pregnant or think they might be pregnant. Men should not father a child while taking this medicine and for 3 months after stopping it. There is potential for serious side effects to an unborn child. Talk to your health care professional for more information. Do not breast-feed an infant while taking this  medicine or for 7 days after stopping it. This medicine has caused ovarian failure in some women. This medicine may make it more difficult to get pregnant. Talk to your health care professional if you are concerned about your fertility. This medicine has caused decreased sperm counts in some men. This may make it more difficult to father a child. Talk to your health care professional if you are concerned about your fertility. What side effects may I notice from receiving this medicine? Side effects that you should report to your doctor or health care professional as soon as possible:  allergic reactions like skin rash, itching or hives, swelling of the face, lips, or tongue  chest pain  diarrhea  flushing, runny nose, sweating during infusion  low blood counts - this medicine may decrease the number of white blood cells, red blood cells and platelets. You may be at increased risk  for infections and bleeding.  nausea, vomiting  pain, swelling, warmth in the leg  signs of decreased platelets or bleeding - bruising, pinpoint red spots on the skin, black, tarry stools, blood in the urine  signs of infection - fever or chills, cough, sore throat, pain or difficulty passing urine  signs of decreased red blood cells - unusually weak or tired, fainting spells, lightheadedness Side effects that usually do not require medical attention (report to your doctor or health care professional if they continue or are bothersome):  constipation  hair loss  headache  loss of appetite  mouth sores  stomach pain This list may not describe all possible side effects. Call your doctor for medical advice about side effects. You may report side effects to FDA at 1-800-FDA-1088. Where should I keep my medicine? This drug is given in a hospital or clinic and will not be stored at home. NOTE: This sheet is a summary. It may not cover all possible information. If you have questions about this medicine,  talk to your doctor, pharmacist, or health care provider.  2021 Elsevier/Gold Standard (2019-04-18 17:46:13) Fluorouracil, 5-FU injection What is this medicine? FLUOROURACIL, 5-FU (flure oh YOOR a sil) is a chemotherapy drug. It slows the growth of cancer cells. This medicine is used to treat many types of cancer like breast cancer, colon or rectal cancer, pancreatic cancer, and stomach cancer. This medicine may be used for other purposes; ask your health care provider or pharmacist if you have questions. COMMON BRAND NAME(S): Adrucil What should I tell my health care provider before I take this medicine? They need to know if you have any of these conditions:  blood disorders  dihydropyrimidine dehydrogenase (DPD) deficiency  infection (especially a virus infection such as chickenpox, cold sores, or herpes)  kidney disease  liver disease  malnourished, poor nutrition  recent or ongoing radiation therapy  an unusual or allergic reaction to fluorouracil, other chemotherapy, other medicines, foods, dyes, or preservatives  pregnant or trying to get pregnant  breast-feeding How should I use this medicine? This drug is given as an infusion or injection into a vein. It is administered in a hospital or clinic by a specially trained health care professional. Talk to your pediatrician regarding the use of this medicine in children. Special care may be needed. Overdosage: If you think you have taken too much of this medicine contact a poison control center or emergency room at once. NOTE: This medicine is only for you. Do not share this medicine with others. What if I miss a dose? It is important not to miss your dose. Call your doctor or health care professional if you are unable to keep an appointment. What may interact with this medicine? Do not take this medicine with any of the following medications:  live virus vaccines This medicine may also interact with the following  medications:  medicines that treat or prevent blood clots like warfarin, enoxaparin, and dalteparin This list may not describe all possible interactions. Give your health care provider a list of all the medicines, herbs, non-prescription drugs, or dietary supplements you use. Also tell them if you smoke, drink alcohol, or use illegal drugs. Some items may interact with your medicine. What should I watch for while using this medicine? Visit your doctor for checks on your progress. This drug may make you feel generally unwell. This is not uncommon, as chemotherapy can affect healthy cells as well as cancer cells. Report any side effects. Continue your course  of treatment even though you feel ill unless your doctor tells you to stop. In some cases, you may be given additional medicines to help with side effects. Follow all directions for their use. Call your doctor or health care professional for advice if you get a fever, chills or sore throat, or other symptoms of a cold or flu. Do not treat yourself. This drug decreases your body's ability to fight infections. Try to avoid being around people who are sick. This medicine may increase your risk to bruise or bleed. Call your doctor or health care professional if you notice any unusual bleeding. Be careful brushing and flossing your teeth or using a toothpick because you may get an infection or bleed more easily. If you have any dental work done, tell your dentist you are receiving this medicine. Avoid taking products that contain aspirin, acetaminophen, ibuprofen, naproxen, or ketoprofen unless instructed by your doctor. These medicines may hide a fever. Do not become pregnant while taking this medicine. Women should inform their doctor if they wish to become pregnant or think they might be pregnant. There is a potential for serious side effects to an unborn child. Talk to your health care professional or pharmacist for more information. Do not breast-feed  an infant while taking this medicine. Men should inform their doctor if they wish to father a child. This medicine may lower sperm counts. Do not treat diarrhea with over the counter products. Contact your doctor if you have diarrhea that lasts more than 2 days or if it is severe and watery. This medicine can make you more sensitive to the sun. Keep out of the sun. If you cannot avoid being in the sun, wear protective clothing and use sunscreen. Do not use sun lamps or tanning beds/booths. What side effects may I notice from receiving this medicine? Side effects that you should report to your doctor or health care professional as soon as possible:  allergic reactions like skin rash, itching or hives, swelling of the face, lips, or tongue  low blood counts - this medicine may decrease the number of white blood cells, red blood cells and platelets. You may be at increased risk for infections and bleeding.  signs of infection - fever or chills, cough, sore throat, pain or difficulty passing urine  signs of decreased platelets or bleeding - bruising, pinpoint red spots on the skin, black, tarry stools, blood in the urine  signs of decreased red blood cells - unusually weak or tired, fainting spells, lightheadedness  breathing problems  changes in vision  chest pain  mouth sores  nausea and vomiting  pain, swelling, redness at site where injected  pain, tingling, numbness in the hands or feet  redness, swelling, or sores on hands or feet  stomach pain  unusual bleeding Side effects that usually do not require medical attention (report to your doctor or health care professional if they continue or are bothersome):  changes in finger or toe nails  diarrhea  dry or itchy skin  hair loss  headache  loss of appetite  sensitivity of eyes to the light  stomach upset  unusually teary eyes This list may not describe all possible side effects. Call your doctor for medical  advice about side effects. You may report side effects to FDA at 1-800-FDA-1088. Where should I keep my medicine? This drug is given in a hospital or clinic and will not be stored at home. NOTE: This sheet is a summary. It may not cover  all possible information. If you have questions about this medicine, talk to your doctor, pharmacist, or health care provider.  2021 Elsevier/Gold Standard (2019-04-18 15:00:03)

## 2020-09-05 ENCOUNTER — Other Ambulatory Visit: Payer: Self-pay

## 2020-09-05 ENCOUNTER — Inpatient Hospital Stay: Payer: PRIVATE HEALTH INSURANCE

## 2020-09-05 VITALS — BP 114/69 | HR 104 | Temp 98.1°F | Resp 18 | Ht 68.0 in | Wt 208.1 lb

## 2020-09-05 DIAGNOSIS — C182 Malignant neoplasm of ascending colon: Secondary | ICD-10-CM

## 2020-09-05 DIAGNOSIS — Z5112 Encounter for antineoplastic immunotherapy: Secondary | ICD-10-CM | POA: Diagnosis not present

## 2020-09-05 MED ORDER — POTASSIUM CHLORIDE 10 MEQ/100ML IV SOLN
INTRAVENOUS | Status: AC
  Filled 2020-09-05: qty 100

## 2020-09-05 MED ORDER — POTASSIUM CHLORIDE 10 MEQ/100ML IV SOLN
10.0000 meq | INTRAVENOUS | Status: AC
  Administered 2020-09-05 (×2): 10 meq via INTRAVENOUS

## 2020-09-05 MED ORDER — HEPARIN SOD (PORK) LOCK FLUSH 100 UNIT/ML IV SOLN
500.0000 [IU] | Freq: Once | INTRAVENOUS | Status: AC | PRN
  Administered 2020-09-05: 500 [IU]
  Filled 2020-09-05: qty 5

## 2020-09-05 MED ORDER — SODIUM CHLORIDE 0.9 % IV SOLN
Freq: Once | INTRAVENOUS | Status: AC
  Filled 2020-09-05: qty 250

## 2020-09-05 NOTE — Patient Instructions (Signed)
Potassium chloride injection What is this medicine? POTASSIUM CHLORIDE (poe TASS i um KLOOR ide) is a potassium supplement used to prevent and to treat low potassium. Potassium is important for the heart, muscles, and nerves. Too much or too little potassium in the body can cause serious problems. This medicine may be used for other purposes; ask your health care provider or pharmacist if you have questions. COMMON BRAND NAME(S): PROAMP What should I tell my health care provider before I take this medicine? They need to know if you have any of these conditions:  Addison disease  dehydration  diabetes (high blood sugar)  heart disease  high levels of potassium in the blood  irregular heartbeat or rhythm  kidney disease  large areas of burned skin  an unusual or allergic reaction to potassium, other medicines, foods, dyes, or preservatives  pregnant or trying to get pregnant  breast-feeding How should I use this medicine? This medicine is injected into a vein. It is given by a health care provider in a hospital or clinic setting. Talk to your health care provider about the use of this medicine in children. Special care may be needed. Overdosage: If you think you have taken too much of this medicine contact a poison control center or emergency room at once. NOTE: This medicine is only for you. Do not share this medicine with others. What if I miss a dose? This does not apply. This medicine is not for regular use. What may interact with this medicine? Do not take this medicine with any of the following medications:  certain diuretics such as spironolactone, triamterene  eplerenone  sodium polystyrene sulfonate This medicine may also interact with the following medications:  certain medicines for blood pressure or heart disease like lisinopril, losartan, quinapril, valsartan  medicines that lower your chance of fighting infection such as cyclosporine, tacrolimus  NSAIDs,  medicines for pain and inflammation, like ibuprofen or naproxen  other potassium supplements  salt substitutes This list may not describe all possible interactions. Give your health care provider a list of all the medicines, herbs, non-prescription drugs, or dietary supplements you use. Also tell them if you smoke, drink alcohol, or use illegal drugs. Some items may interact with your medicine. What should I watch for while using this medicine? Visit your health care provider for regular checks on your progress. Tell your health care provider if your symptoms do not start to get better or if they get worse. You may need blood work while you are taking this medicine. Avoid salt substitutes unless you are told otherwise by your health care provider. What side effects may I notice from receiving this medicine? Side effects that you should report to your doctor or health care professional as soon as possible:  allergic reactions (skin rash, itching, hives, swelling of the face, lips, tongue, or throat)  confusion  high potassium levels (muscle weakness, fast or irregular heartbeat)  low blood pressure (dizziness, feeling faint or lightheaded, blurry vision)  pain, tingling, or numbness in lips, hands, or feet  pain, redness, or irritation at site where injected  trouble breathing Side effects that usually do not require medical attention (report to your doctor or health care professional if they continue or are bothersome):  diarrhea  nausea, vomiting  passing gas  stomach pain This list may not describe all possible side effects. Call your doctor for medical advice about side effects. You may report side effects to FDA at 1-800-FDA-1088. Where should I keep my   medicine? This medicine is given in a hospital or clinic. It will not be stored at home. NOTE: This sheet is a summary. It may not cover all possible information. If you have questions about this medicine, talk to your  doctor, pharmacist, or health care provider.  2021 Elsevier/Gold Standard (2019-03-16 18:18:09)  

## 2020-09-05 NOTE — Progress Notes (Signed)
Pt d/c stable at 1650

## 2020-09-15 ENCOUNTER — Other Ambulatory Visit: Payer: Self-pay | Admitting: Oncology

## 2020-09-15 NOTE — Progress Notes (Signed)
[facility]  [address] [place],  [place]  [address] [phone]  Clinic Day:  09/16/2020  Referring physician: Greig Right, MD   HISTORY OF PRESENT ILLNESS:  The patient is a 55 y.o. female with metastatic colon cancer, which includes spread of disease to her abdominal cavity, lungs, brain and right adrenal gland. She comes in today to be evauated before heaidng into her 4th cycle of FOLFIRI/Avastin.  The patient claims to have toleraed her 3rd cycle of treatment fairly well.  She did have some fatigue, diarrhea and a skin rath, but none of these side effects significantly impacted her daily quality of life.  She denies having any new GI or other systemic symptoms which concern her for disease progression.  With respect to her colon cancer history, she is status post a right hemicolectomy in early October 2018, followed by 12 cycles of adjuvant chemotherapy, which were completed in April 2019.  In December 2021, she underwent a left cerebellar metastatectomy, whose pathology was consistent with metastatic colon cancer.  Tumor testing did come back MMR normal.  CT scans recently showed evidence of her cancer being in multiple locations, which has her on palliative chemotherapy at this time.    PHYSICAL EXAM:  Blood pressure (!) 97/54, pulse (!) 124, temperature 98.2 F (36.8 C), resp. rate 16, height 5' 8"  (1.727 m), weight 205 lb 6.4 oz (93.2 kg), last menstrual period 02/22/2019, SpO2 94 %. Wt Readings from Last 3 Encounters:  09/16/20 205 lb 6.4 oz (93.2 kg)  09/05/20 208 lb 1.9 oz (94.4 kg)  09/03/20 206 lb 0.6 oz (93.5 kg)   Body mass index is 31.23 kg/m. Performance status (ECOG): 0 - Asymptomatic Physical Exam Constitutional:      Appearance: Normal appearance. She is not ill-appearing.  HENT:     Mouth/Throat:     Mouth: Mucous membranes are moist.     Pharynx: Oropharynx is clear. No oropharyngeal exudate or posterior oropharyngeal  erythema.  Cardiovascular:     Rate and Rhythm: Normal rate and regular rhythm.     Heart sounds: No murmur heard. No friction rub. No gallop.   Pulmonary:     Effort: Pulmonary effort is normal. No respiratory distress.     Breath sounds: Normal breath sounds. No wheezing, rhonchi or rales.  Chest:  Breasts:     Right: No axillary adenopathy or supraclavicular adenopathy.     Left: No axillary adenopathy or supraclavicular adenopathy.    Abdominal:     General: Bowel sounds are normal. There is no distension.     Palpations: Abdomen is soft. There is no mass.     Tenderness: There is no abdominal tenderness.  Musculoskeletal:        General: No swelling.     Right lower leg: No edema.     Left lower leg: No edema.  Lymphadenopathy:     Cervical: No cervical adenopathy.     Upper Body:     Right upper body: No supraclavicular or axillary adenopathy.     Left upper body: No supraclavicular or axillary adenopathy.     Lower Body: No right inguinal adenopathy. No left inguinal adenopathy.  Skin:    General: Skin is warm.     Coloration: Skin is not jaundiced.     Findings: No lesion or rash.  Neurological:     General: No focal deficit present.     Mental Status: She is alert and oriented to person, place,  and time. Mental status is at baseline.     Cranial Nerves: Cranial nerves are intact.  Psychiatric:        Mood and Affect: Mood normal.        Behavior: Behavior normal.        Thought Content: Thought content normal.    LABS:     ASSESSMENT & PLAN:  Assessment/Plan:  A 55 y.o. female with metastatic colon cancer.  She will proceed with her 4th cycle of FOLFIRI/Avastin next week. Overall, the patient clinically appears to be doing well.   I will see her back in 2 weeks before she heads into her 5th cycle of FOLFIRI/Avastin.  CT scans will be done before her next visit to ascertain her new disease baseline after 4 cycles of palliative chemotherapy.  The patient  understands all the plans discussed today and is in agreement with them.    Dondre Catalfamo Macarthur Critchley, MD

## 2020-09-16 ENCOUNTER — Ambulatory Visit (INDEPENDENT_AMBULATORY_CARE_PROVIDER_SITE_OTHER): Payer: PRIVATE HEALTH INSURANCE | Admitting: Psychiatry

## 2020-09-16 ENCOUNTER — Inpatient Hospital Stay: Payer: PRIVATE HEALTH INSURANCE

## 2020-09-16 ENCOUNTER — Other Ambulatory Visit: Payer: Self-pay | Admitting: Nurse Practitioner

## 2020-09-16 ENCOUNTER — Other Ambulatory Visit: Payer: Self-pay | Admitting: Oncology

## 2020-09-16 ENCOUNTER — Other Ambulatory Visit: Payer: Self-pay

## 2020-09-16 ENCOUNTER — Encounter: Payer: Self-pay | Admitting: Psychiatry

## 2020-09-16 ENCOUNTER — Inpatient Hospital Stay (INDEPENDENT_AMBULATORY_CARE_PROVIDER_SITE_OTHER): Payer: PRIVATE HEALTH INSURANCE | Admitting: Oncology

## 2020-09-16 VITALS — BP 97/54 | HR 124 | Temp 98.2°F | Resp 16 | Ht 68.0 in | Wt 205.4 lb

## 2020-09-16 DIAGNOSIS — R530 Neoplastic (malignant) related fatigue: Secondary | ICD-10-CM

## 2020-09-16 DIAGNOSIS — F422 Mixed obsessional thoughts and acts: Secondary | ICD-10-CM

## 2020-09-16 DIAGNOSIS — G4721 Circadian rhythm sleep disorder, delayed sleep phase type: Secondary | ICD-10-CM

## 2020-09-16 DIAGNOSIS — F333 Major depressive disorder, recurrent, severe with psychotic symptoms: Secondary | ICD-10-CM | POA: Diagnosis not present

## 2020-09-16 DIAGNOSIS — C182 Malignant neoplasm of ascending colon: Secondary | ICD-10-CM | POA: Diagnosis not present

## 2020-09-16 DIAGNOSIS — G3184 Mild cognitive impairment, so stated: Secondary | ICD-10-CM

## 2020-09-16 DIAGNOSIS — Z5112 Encounter for antineoplastic immunotherapy: Secondary | ICD-10-CM | POA: Diagnosis not present

## 2020-09-16 LAB — COMPREHENSIVE METABOLIC PANEL
Albumin: 4.1 (ref 3.5–5.0)
Calcium: 8.9 (ref 8.7–10.7)

## 2020-09-16 LAB — TOTAL PROTEIN, URINE DIPSTICK: Protein, ur: NEGATIVE mg/dL

## 2020-09-16 LAB — BASIC METABOLIC PANEL
BUN: 16 (ref 4–21)
CO2: 22 (ref 13–22)
Chloride: 104 (ref 99–108)
Creatinine: 1.1 (ref 0.5–1.1)
Glucose: 220
Potassium: 4.5 (ref 3.4–5.3)
Sodium: 137 (ref 137–147)

## 2020-09-16 LAB — HEPATIC FUNCTION PANEL
ALT: 34 (ref 7–35)
AST: 32 (ref 13–35)
Bilirubin, Total: 0.5

## 2020-09-16 LAB — CBC
MCH: 32.6
MCHC: 33
MCV: 98 (ref 76–111)
RBC: 3.94 (ref 3.87–5.11)

## 2020-09-16 LAB — CBC AND DIFFERENTIAL
HCT: 38 (ref 36–46)
Hemoglobin: 12.8 (ref 12.0–16.0)
NEUT#: 1.68
Platelets: 137 — AB (ref 150–399)
WBC: 4

## 2020-09-16 MED ORDER — MODAFINIL 200 MG PO TABS: ORAL_TABLET | ORAL | 1 refills | Status: DC

## 2020-09-16 MED ORDER — ZOLPIDEM TARTRATE 5 MG PO TABS: 5.0000 mg | ORAL_TABLET | Freq: Every evening | ORAL | 0 refills | Status: DC | PRN

## 2020-09-16 NOTE — Progress Notes (Signed)
Proceed with bevacizumab until date of root canal is known per Dr. Bobby Rumpf.

## 2020-09-16 NOTE — Progress Notes (Signed)
Shannon [name] [id number] 1965/11/12 55 y.o.    Subjective:   Patient ID:  Shannon [name] is a 55 y.o. (DOB 11-23-1965) female.  Chief Complaint:  Chief Complaint  Patient presents with  . Follow-up  . Severe recurrent major depression with psychotic features (  . Stress    HPI Shannon [name] presents  today for follow-up of major depression with psychotic features and OCD.  Last seen November 2020 .  No meds were changed at that time.  10/16/19 the following is noted at this appt: Asked questions about sleep supplement.   Just started GI doctor.   Good overall. No mood swings. No depressive episodes.  Cry more with hormonal changes more easily.   Probably sleep too much to avoid.  Occ stress from work interferes with sleep.  Not as productive from home.  Hopes to get back to the office.  Still working from home.  Boss noticed her forgetfulness with deadlines and missing issues in emails.  Used to be sharper. Wonders if related to meds.  Can usually nap if desired for years and not seasonal.  Only anxiety is Covid related. More poor self care.   Hysterectomy December 2020. No med changes  03/18/2020 appointment with the following noted: July had Covid and both parents and her father died from [place]. Able to help mom with arrangements. Required one iron infusion this year. Never took NAC bc it was too hard to take. Sleep, eating schedule is irregular. More productive at home helps her feel better. Plan: No med changes. 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 For a month had dizzy, falls, NV all of which cleared quickly after removal.  Radiation to brain after this. More CA in lungs and abdomen and having chemo.  Oncologist said she has about 5 years of lifespan  left.  MRI and CT pending. Gotten into therapy starting Shannon [name] next week.   Pray a lot and good connection with friends.  Good support.  Trying to do things for fun.   Is working but finding it difficult mentally and physically DT SE fatigue from chemo. Considering LT disability but $ concerns.   If anxious trouble falling asleep.   Works from home.   D 55 yo.    Only situational mood effects. Patient reports stable mood and denies depressed or irritable moods. Some anxiety over it.  Patient denies difficulty with sleep maintenance.   Denies appetite disturbance.  Patient reports that energy and motivation have been good.  Patient denies any difficulty with concentration.  Patient denies any suicidal ideation. Anxious, obsessive thoughts minimal.  Past Psychiatric Medication Trials: HX RELAPSE PSYCHOSIS WITH GENERIC QUETIAPINE,  Citalopram, fluoxetine 80, sertraline to 50, inVega 3, Abilify 15, lamotrigine 200, Seroquel 800, Risperdal 2 Has been under the care of the psychiatrist since March 2000  Review of Systems:  Review of Systems  Constitutional: Negative for unexpected weight change.  Cardiovascular: Negative for palpitations.  Gastrointestinal: Positive for abdominal pain, nausea and vomiting.  Neurological: Negative for tremors and weakness.  Psychiatric/Behavioral: Negative for agitation, behavioral problems, confusion, decreased concentration, dysphoric mood, hallucinations, self-injury, sleep disturbance and suicidal ideas. The patient is not nervous/anxious and is not hyperactive.     Medications: I have reviewed the patient's current medications.  Current Outpatient Medications  Medication Sig Dispense Refill  . acetaminophen (TYLENOL) 325 MG tablet Take 2 tablets (650 mg total) by mouth every 6 (six) hours as needed for mild pain.    Marland Kitchen atorvastatin (LIPITOR) 10 MG tablet Take 1 tablet (10 mg total) by mouth at bedtime. 30 tablet 0  . CONTOUR NEXT TEST test strip  3 (three) times daily.    . Dulaglutide (TRULICITY) 1.5 ZL/9.3TT SOPN Inject into the skin.    . fluvoxaMINE (LUVOX) 100 MG tablet Take 3 tablets (300 mg total) by mouth at bedtime. 270 tablet 1  . hyoscyamine (LEVSIN SL) 0.125 MG SL tablet Place 1 tablet (0.125 mg total) under the tongue every 6 (six) hours as needed. 45 tablet 1  . insulin aspart (NOVOLOG FLEXPEN) 100 UNIT/ML FlexPen Inject 8 Units into the skin 3 (three) times daily with meals. 15 mL 11  . insulin glargine (LANTUS) 100 UNIT/ML Solostar Pen Inject 26 Units into the skin daily. 15 mL 11  . lamoTRIgine (LAMICTAL) 150 MG tablet Take 1 tablet (150 mg total) by mouth daily. 90 tablet 3  . levothyroxine (SYNTHROID) 75 MCG tablet Take 1 tablet (75 mcg total) by mouth daily. 30 tablet 0  . metFORMIN (GLUCOPHAGE) 500 MG tablet Take 1 tablet (500 mg total) by mouth 2 (two) times daily with a meal. 60 tablet 0  . modafinil (PROVIGIL) 200 MG tablet 1/2 tablet each morning for 1 week, then 1 each AM 30 tablet 1  . NOVOFINE PEN NEEDLE 32G X 6 MM MISC SMARTSIG:Syringe(s) SUB-Q    . ondansetron (ZOFRAN) 4 MG tablet Take 1 tablet (4 mg total) by mouth every 4 (four) hours as needed for nausea. 90 tablet 3  . pantoprazole (PROTONIX) 40 MG tablet TAKE 1 TABLET BY MOUTH EVERYDAY AT BEDTIME 30 tablet 0  . prochlorperazine (COMPAZINE) 10 MG tablet Take 1 tablet (10 mg total) by mouth every 6 (six) hours as needed for nausea or vomiting. 90 tablet 3  . QUEtiapine (SEROQUEL) 400 MG tablet Take 2 tablets (800 mg total) by mouth at bedtime. 30 tablet 0  . zolpidem (AMBIEN) 5 MG tablet Take 1 tablet (5 mg total) by mouth at bedtime as needed for sleep. 30 tablet 0  . dexamethasone (DECADRON) 2 MG tablet Take 1 tablet (2 mg total) by mouth daily. (Patient not taking: Reported on 09/16/2020) 30 tablet 0  . HYDROmorphone (DILAUDID) 2 MG tablet Take 1 tablet (2 mg total) by mouth at bedtime as needed for severe pain (severe headache). (Patient not taking:  Reported on 09/16/2020) 7 tablet 0  . metroNIDAZOLE (FLAGYL) 250 MG tablet  (Patient not taking: Reported on 09/16/2020)    . oxyCODONE (OXY IR/ROXICODONE) 5 MG immediate release tablet Take 1-2 tablets (5-10 mg total) by mouth every 4 (four) hours as needed (moderate pain 44m, severe pain 168m. (Patient not taking: Reported on 09/16/2020) 30 tablet 0  . senna-docusate (SENOKOT-S) 8.6-50 MG tablet Take 2 tablets by mouth at bedtime. (Patient not taking: Reported on 09/16/2020)    . topiramate (TOPAMAX) 50 MG tablet Take 1 tablet (50 mg total) by mouth 2 (two) times daily. (Patient not taking: Reported on 09/16/2020) 60 tablet 0   No current facility-administered medications for this visit.    Medication Side Effects: None  Allergies:  Allergies  Allergen Reactions  . Clindamycin/Lincomycin Rash  . Penicillins Rash    Reaction: 10 years    Past Medical History:  Diagnosis Date  . Anemia    Iron De  .  Anxiety   . Blood clot in vein   . Bowel obstruction ([place])   . Colon cancer ([place]) 2018   treated with surgery and chemotherapy  . Depression   . Diabetes mellitus without complication ([place])    Type II  . DVT (deep venous thrombosis) ([place]) 2019   behind right knee- while she wasa on chemo  . GERD (gastroesophageal reflux disease)   . History of blood transfusion   . History of chemotherapy   . History of kidney stones 2012   passed  . Hypothyroidism   . Obesity     Family History  Problem Relation Age of Onset  . Irritable bowel syndrome Mother   . Colon polyps Father   . Breast cancer Maternal Grandmother   . Diabetes Maternal Grandmother   . Diabetes Maternal Grandfather   . Breast cancer Paternal Grandmother   . Colon polyps Maternal Uncle   . Stomach cancer Neg Hx   . Pancreatic cancer Neg Hx   . Esophageal cancer Neg Hx   . Colon cancer Neg Hx     Social History   Socioeconomic History  . Marital status: Widowed    Spouse name: Not on file  . Number of  children: Not on file  . Years of education: Not on file  . Highest education level: Not on file  Occupational History  . Occupation: care coordinator   Tobacco Use  . Smoking status: Never Smoker  . Smokeless tobacco: Never Used  Vaping Use  . Vaping Use: Never used  Substance and Sexual Activity  . Alcohol use: Yes    Comment: rare  . Drug use: Never  . Sexual activity: Not Currently  Other Topics Concern  . Not on file  Social History Narrative  . Not on file   Social Determinants of Health   Financial Resource Strain: Not on file  Food Insecurity: Not on file  Transportation Needs: Not on file  Physical Activity: Not on file  Stress: Not on file  Social Connections: Not on file  Intimate Partner Violence: Not on file    Past Medical History, Surgical history, Social history, and Family history were reviewed and updated as appropriate.   Please see review of systems for further details on the patient's review from today.   Objective:   Physical Exam:  LMP 02/22/2019 (Approximate)   Physical Exam Constitutional:      General: She is not in acute distress.    Appearance: She is well-developed.  Musculoskeletal:        General: No deformity.  Neurological:     Mental Status: She is alert and oriented to person, place, and time.     Motor: No tremor.     Coordination: Coordination normal.     Gait: Gait normal.  Psychiatric:        Attention and Perception: She is attentive.        Mood and Affect: Mood is anxious. Mood is not depressed. Affect is not labile, blunt, angry or inappropriate.        Speech: Speech normal. Speech is not rapid and pressured.        Behavior: Behavior normal.        Thought Content: Thought content normal. Thought content does not include homicidal or suicidal ideation. Thought content does not include homicidal or suicidal plan.        Cognition and Memory: Cognition normal.        Judgment: Judgment normal.  Comments: Insight  intact. No auditory or visual hallucinations. No delusions.  Mild mod intermittent anxierty     Lab Review:     Component Value Date/Time   NA 137 09/16/2020 0000   K 4.5 09/16/2020 0000   CL 104 09/16/2020 0000   CO2 22 09/16/2020 0000   GLUCOSE 234 (H) 06/07/2020 0525   BUN 16 09/16/2020 0000   CREATININE 1.1 09/16/2020 0000   CREATININE 0.97 06/07/2020 0525   CALCIUM 8.9 09/16/2020 0000   PROT 5.2 (L) 06/07/2020 0525   ALBUMIN 4.1 09/16/2020 0000   AST 32 09/16/2020 0000   ALT 34 09/16/2020 0000   ALKPHOS 128 (A) 08/30/2020 0000   BILITOT 0.3 06/07/2020 0525   GFRNONAA >60 06/07/2020 0525       Component Value Date/Time   WBC 4.0 09/16/2020 0000   WBC 4.8 06/07/2020 0525   RBC 3.94 09/16/2020 0000   HGB 12.8 09/16/2020 0000   HCT 38 09/16/2020 0000   PLT 137 (A) 09/16/2020 0000   MCV 98 09/16/2020 0000   MCH 32.6 09/16/2020 0000   MCHC 33 09/16/2020 0000   RDW 13.5 06/07/2020 0525   LYMPHSABS 1.0 06/07/2020 0525   MONOABS 0.4 06/07/2020 0525   EOSABS 0.0 06/07/2020 0525   BASOSABS 0.0 06/07/2020 0525    No results found for: POCLITH, LITHIUM   No results found for: PHENYTOIN, PHENOBARB, VALPROATE, CBMZ   .res Assessment: Plan:    Severe recurrent major depression with psychotic features ([place]) - Plan: modafinil (PROVIGIL) 200 MG tablet  Mixed obsessional thoughts and acts  Mild cognitive impairment - Plan: modafinil (PROVIGIL) 200 MG tablet  Delayed sleep phase syndrome - Plan: zolpidem (AMBIEN) 5 MG tablet  Neoplastic malignant related fatigue - Plan: modafinil (PROVIGIL) 200 MG tablet   Overall mood and anxiety are managed.  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 In general has handled this well except some trouble going to sleep on occ with anxiety.  Try to avoid Xanax if possible BC DDI with Luvox. Ambien 5 prn.  Disc amnesia risk.  Modafinil 200 mg   Discussed potential  metabolic side effects associated with atypical antipsychotics, as well as potential risk for movement side effects. Advised pt to contact office if movement side effects occur.   Counseled patient regarding potential benefits, risks, and side effects of Lamictal to include potential risk of Stevens-Johnson syndrome. Advised patient to stop taking Lamictal and contact office immediately if rash develops and to seek urgent medical attention if rash is severe and/or spreading quickly.  She's benefitting from meds and is satisfied.  Continue current meds. HX RELAPSE PSYCHOSIS WITH GENERIC QUETIAPINE, Continue branded Seroquel 800 mg HS and lamotrigine 150 mg daily and Luvox 300 mg daily  FU 2-3 mos  Lynder Parents, MD, DFAPA   Please see After Visit Summary for patient specific instructions.  Future Appointments  Date Time Provider [place]  09/17/2020  9:00 AM [facility] INFUSION CHAIR 3 [facility] None  09/19/2020  2:00 PM [facility] INFUSION CHAIR 2 [facility] None  09/24/2020  8:00 AM Edgardo Roys, PsyD [facility] CPR  10/04/2020 11:50 AM [facility] MR 3 [facility]MRI [facility] [address]  10/07/2020  7:00 AM [facility] BOARD CONFERENCE [facility] None  10/08/2020 12:00 PM Vaslow, Acey Lav, MD [facility] None  10/24/2020  2:00 PM Jamse Arn, MD [facility] CPR    No orders of the defined types were placed in this encounter.     -------------------------------

## 2020-09-17 ENCOUNTER — Other Ambulatory Visit: Payer: Self-pay

## 2020-09-17 ENCOUNTER — Inpatient Hospital Stay: Payer: PRIVATE HEALTH INSURANCE

## 2020-09-17 ENCOUNTER — Other Ambulatory Visit: Payer: Self-pay | Admitting: Internal Medicine

## 2020-09-17 VITALS — BP 116/83 | HR 104 | Temp 97.9°F | Resp 18 | Ht 68.0 in | Wt 205.0 lb

## 2020-09-17 DIAGNOSIS — C182 Malignant neoplasm of ascending colon: Secondary | ICD-10-CM

## 2020-09-17 DIAGNOSIS — Z5112 Encounter for antineoplastic immunotherapy: Secondary | ICD-10-CM | POA: Diagnosis not present

## 2020-09-17 MED ORDER — LEUCOVORIN CALCIUM INJECTION 350 MG
400.0000 mg/m2 | Freq: Once | INTRAVENOUS | Status: AC
  Administered 2020-09-17: 836 mg via INTRAVENOUS
  Filled 2020-09-17: qty 41.8

## 2020-09-17 MED ORDER — ATROPINE SULFATE 1 MG/ML IJ SOLN
0.5000 mg | Freq: Once | INTRAMUSCULAR | Status: AC
  Administered 2020-09-17: 0.5 mg via INTRAVENOUS

## 2020-09-17 MED ORDER — SODIUM CHLORIDE 0.9 % IV SOLN
2400.0000 mg/m2 | INTRAVENOUS | Status: DC
  Administered 2020-09-17: 5000 mg via INTRAVENOUS
  Filled 2020-09-17: qty 100

## 2020-09-17 MED ORDER — SODIUM CHLORIDE 0.9 % IV SOLN
5.0000 mg/kg | Freq: Once | INTRAVENOUS | Status: AC
  Administered 2020-09-17: 500 mg via INTRAVENOUS
  Filled 2020-09-17: qty 16

## 2020-09-17 MED ORDER — ATROPINE SULFATE 1 MG/ML IJ SOLN
INTRAMUSCULAR | Status: AC
  Filled 2020-09-17: qty 1

## 2020-09-17 MED ORDER — SODIUM CHLORIDE 0.9 % IV SOLN
Freq: Once | INTRAVENOUS | Status: AC
  Filled 2020-09-17: qty 250

## 2020-09-17 MED ORDER — PALONOSETRON HCL INJECTION 0.25 MG/5ML
INTRAVENOUS | Status: AC
  Filled 2020-09-17: qty 5

## 2020-09-17 MED ORDER — SODIUM CHLORIDE 0.9 % IV SOLN
10.0000 mg | Freq: Once | INTRAVENOUS | Status: AC
  Administered 2020-09-17: 10 mg via INTRAVENOUS
  Filled 2020-09-17: qty 10

## 2020-09-17 MED ORDER — IRINOTECAN HCL CHEMO INJECTION 100 MG/5ML
180.0000 mg/m2 | Freq: Once | INTRAVENOUS | Status: AC
  Administered 2020-09-17: 380 mg via INTRAVENOUS
  Filled 2020-09-17: qty 15

## 2020-09-17 MED ORDER — SODIUM CHLORIDE 0.9 % IV SOLN
Freq: Once | INTRAVENOUS | Status: AC
Start: 2020-09-17 — End: 2020-09-17
  Filled 2020-09-17: qty 250

## 2020-09-17 MED ORDER — HEPARIN SOD (PORK) LOCK FLUSH 100 UNIT/ML IV SOLN
500.0000 [IU] | Freq: Once | INTRAVENOUS | Status: DC | PRN
  Filled 2020-09-17: qty 5

## 2020-09-17 MED ORDER — FLUOROURACIL CHEMO INJECTION 2.5 GM/50ML
400.0000 mg/m2 | Freq: Once | INTRAVENOUS | Status: AC
  Administered 2020-09-17: 850 mg via INTRAVENOUS
  Filled 2020-09-17: qty 17

## 2020-09-17 MED ORDER — PALONOSETRON HCL INJECTION 0.25 MG/5ML
0.2500 mg | Freq: Once | INTRAVENOUS | Status: AC
  Administered 2020-09-17: 0.25 mg via INTRAVENOUS

## 2020-09-17 NOTE — Patient Instructions (Signed)
Leucovorin injection What is this medicine? LEUCOVORIN (loo koe VOR in) is used to prevent or treat the harmful effects of some medicines. This medicine is used to treat anemia caused by a low amount of folic acid in the body. It is also used with 5-fluorouracil (5-FU) to treat colon cancer. This medicine may be used for other purposes; ask your health care provider or pharmacist if you have questions. What should I tell my health care provider before I take this medicine? They need to know if you have any of these conditions:  anemia from low levels of vitamin B-12 in the blood  an unusual or allergic reaction to leucovorin, folic acid, other medicines, foods, dyes, or preservatives  pregnant or trying to get pregnant  breast-feeding How should I use this medicine? This medicine is for injection into a muscle or into a vein. It is given by a health care professional in a hospital or clinic setting. Talk to your pediatrician regarding the use of this medicine in children. Special care may be needed. Overdosage: If you think you have taken too much of this medicine contact a poison control center or emergency room at once. NOTE: This medicine is only for you. Do not share this medicine with others. What if I miss a dose? This does not apply. What may interact with this medicine?  capecitabine  fluorouracil  phenobarbital  phenytoin  primidone  trimethoprim-sulfamethoxazole This list may not describe all possible interactions. Give your health care provider a list of all the medicines, herbs, non-prescription drugs, or dietary supplements you use. Also tell them if you smoke, drink alcohol, or use illegal drugs. Some items may interact with your medicine. What should I watch for while using this medicine? Your condition will be monitored carefully while you are receiving this medicine. This medicine may increase the side effects of 5-fluorouracil, 5-FU. Tell your doctor or health  care professional if you have diarrhea or mouth sores that do not get better or that get worse. What side effects may I notice from receiving this medicine? Side effects that you should report to your doctor or health care professional as soon as possible:  allergic reactions like skin rash, itching or hives, swelling of the face, lips, or tongue  breathing problems  fever, infection  mouth sores  unusual bleeding or bruising  unusually weak or tired Side effects that usually do not require medical attention (report to your doctor or health care professional if they continue or are bothersome):  constipation or diarrhea  loss of appetite  nausea, vomiting This list may not describe all possible side effects. Call your doctor for medical advice about side effects. You may report side effects to FDA at 1-800-FDA-1088. Where should I keep my medicine? This drug is given in a hospital or clinic and will not be stored at home. NOTE: This sheet is a summary. It may not cover all possible information. If you have questions about this medicine, talk to your doctor, pharmacist, or health care provider.  2021 Elsevier/Gold Standard (2007-11-22 16:50:29) Irinotecan injection What is this medicine? IRINOTECAN (ir in oh TEE kan ) is a chemotherapy drug. It is used to treat colon and rectal cancer. This medicine may be used for other purposes; ask your health care provider or pharmacist if you have questions. COMMON BRAND NAME(S): Camptosar What should I tell my health care provider before I take this medicine? They need to know if you have any of these conditions:  dehydration  diarrhea  infection (especially a virus infection such as chickenpox, cold sores, or herpes)  liver disease  low blood counts, like low white cell, platelet, or red cell counts  low levels of calcium, magnesium, or potassium in the blood  recent or ongoing radiation therapy  an unusual or allergic reaction  to irinotecan, other medicines, foods, dyes, or preservatives  pregnant or trying to get pregnant  breast-feeding How should I use this medicine? This drug is given as an infusion into a vein. It is administered in a hospital or clinic by a specially trained health care professional. Talk to your pediatrician regarding the use of this medicine in children. Special care may be needed. Overdosage: If you think you have taken too much of this medicine contact a poison control center or emergency room at once. NOTE: This medicine is only for you. Do not share this medicine with others. What if I miss a dose? It is important not to miss your dose. Call your doctor or health care professional if you are unable to keep an appointment. What may interact with this medicine? Do not take this medicine with any of the following medications:  cobicistat  itraconazole This medicine may interact with the following medications:  antiviral medicines for HIV or AIDS  certain antibiotics like rifampin or rifabutin  certain medicines for fungal infections like ketoconazole, posaconazole, and voriconazole  certain medicines for seizures like carbamazepine, phenobarbital, phenotoin  clarithromycin  gemfibrozil  nefazodone  St. John's Wort This list may not describe all possible interactions. Give your health care provider a list of all the medicines, herbs, non-prescription drugs, or dietary supplements you use. Also tell them if you smoke, drink alcohol, or use illegal drugs. Some items may interact with your medicine. What should I watch for while using this medicine? Your condition will be monitored carefully while you are receiving this medicine. You will need important blood work done while you are taking this medicine. This drug may make you feel generally unwell. This is not uncommon, as chemotherapy can affect healthy cells as well as cancer cells. Report any side effects. Continue your  course of treatment even though you feel ill unless your doctor tells you to stop. In some cases, you may be given additional medicines to help with side effects. Follow all directions for their use. You may get drowsy or dizzy. Do not drive, use machinery, or do anything that needs mental alertness until you know how this medicine affects you. Do not stand or sit up quickly, especially if you are an older patient. This reduces the risk of dizzy or fainting spells. Call your health care professional for advice if you get a fever, chills, or sore throat, or other symptoms of a cold or flu. Do not treat yourself. This medicine decreases your body's ability to fight infections. Try to avoid being around people who are sick. Avoid taking products that contain aspirin, acetaminophen, ibuprofen, naproxen, or ketoprofen unless instructed by your doctor. These medicines may hide a fever. This medicine may increase your risk to bruise or bleed. Call your doctor or health care professional if you notice any unusual bleeding. Be careful brushing and flossing your teeth or using a toothpick because you may get an infection or bleed more easily. If you have any dental work done, tell your dentist you are receiving this medicine. Do not become pregnant while taking this medicine or for 6 months after stopping it. Women should inform their health care professional  if they wish to become pregnant or think they might be pregnant. Men should not father a child while taking this medicine and for 3 months after stopping it. There is potential for serious side effects to an unborn child. Talk to your health care professional for more information. Do not breast-feed an infant while taking this medicine or for 7 days after stopping it. This medicine has caused ovarian failure in some women. This medicine may make it more difficult to get pregnant. Talk to your health care professional if you are concerned about your  fertility. This medicine has caused decreased sperm counts in some men. This may make it more difficult to father a child. Talk to your health care professional if you are concerned about your fertility. What side effects may I notice from receiving this medicine? Side effects that you should report to your doctor or health care professional as soon as possible:  allergic reactions like skin rash, itching or hives, swelling of the face, lips, or tongue  chest pain  diarrhea  flushing, runny nose, sweating during infusion  low blood counts - this medicine may decrease the number of white blood cells, red blood cells and platelets. You may be at increased risk for infections and bleeding.  nausea, vomiting  pain, swelling, warmth in the leg  signs of decreased platelets or bleeding - bruising, pinpoint red spots on the skin, black, tarry stools, blood in the urine  signs of infection - fever or chills, cough, sore throat, pain or difficulty passing urine  signs of decreased red blood cells - unusually weak or tired, fainting spells, lightheadedness Side effects that usually do not require medical attention (report to your doctor or health care professional if they continue or are bothersome):  constipation  hair loss  headache  loss of appetite  mouth sores  stomach pain This list may not describe all possible side effects. Call your doctor for medical advice about side effects. You may report side effects to FDA at 1-800-FDA-1088. Where should I keep my medicine? This drug is given in a hospital or clinic and will not be stored at home. NOTE: This sheet is a summary. It may not cover all possible information. If you have questions about this medicine, talk to your doctor, pharmacist, or health care provider.  2021 Elsevier/Gold Standard (2019-04-18 17:46:13) Fluorouracil, 5-FU injection What is this medicine? FLUOROURACIL, 5-FU (flure oh YOOR a sil) is a chemotherapy drug.  It slows the growth of cancer cells. This medicine is used to treat many types of cancer like breast cancer, colon or rectal cancer, pancreatic cancer, and stomach cancer. This medicine may be used for other purposes; ask your health care provider or pharmacist if you have questions. COMMON BRAND NAME(S): Adrucil What should I tell my health care provider before I take this medicine? They need to know if you have any of these conditions:  blood disorders  dihydropyrimidine dehydrogenase (DPD) deficiency  infection (especially a virus infection such as chickenpox, cold sores, or herpes)  kidney disease  liver disease  malnourished, poor nutrition  recent or ongoing radiation therapy  an unusual or allergic reaction to fluorouracil, other chemotherapy, other medicines, foods, dyes, or preservatives  pregnant or trying to get pregnant  breast-feeding How should I use this medicine? This drug is given as an infusion or injection into a vein. It is administered in a hospital or clinic by a specially trained health care professional. Talk to your pediatrician regarding the use  of this medicine in children. Special care may be needed. Overdosage: If you think you have taken too much of this medicine contact a poison control center or emergency room at once. NOTE: This medicine is only for you. Do not share this medicine with others. What if I miss a dose? It is important not to miss your dose. Call your doctor or health care professional if you are unable to keep an appointment. What may interact with this medicine? Do not take this medicine with any of the following medications:  live virus vaccines This medicine may also interact with the following medications:  medicines that treat or prevent blood clots like warfarin, enoxaparin, and dalteparin This list may not describe all possible interactions. Give your health care provider a list of all the medicines, herbs, non-prescription  drugs, or dietary supplements you use. Also tell them if you smoke, drink alcohol, or use illegal drugs. Some items may interact with your medicine. What should I watch for while using this medicine? Visit your doctor for checks on your progress. This drug may make you feel generally unwell. This is not uncommon, as chemotherapy can affect healthy cells as well as cancer cells. Report any side effects. Continue your course of treatment even though you feel ill unless your doctor tells you to stop. In some cases, you may be given additional medicines to help with side effects. Follow all directions for their use. Call your doctor or health care professional for advice if you get a fever, chills or sore throat, or other symptoms of a cold or flu. Do not treat yourself. This drug decreases your body's ability to fight infections. Try to avoid being around people who are sick. This medicine may increase your risk to bruise or bleed. Call your doctor or health care professional if you notice any unusual bleeding. Be careful brushing and flossing your teeth or using a toothpick because you may get an infection or bleed more easily. If you have any dental work done, tell your dentist you are receiving this medicine. Avoid taking products that contain aspirin, acetaminophen, ibuprofen, naproxen, or ketoprofen unless instructed by your doctor. These medicines may hide a fever. Do not become pregnant while taking this medicine. Women should inform their doctor if they wish to become pregnant or think they might be pregnant. There is a potential for serious side effects to an unborn child. Talk to your health care professional or pharmacist for more information. Do not breast-feed an infant while taking this medicine. Men should inform their doctor if they wish to father a child. This medicine may lower sperm counts. Do not treat diarrhea with over the counter products. Contact your doctor if you have diarrhea that  lasts more than 2 days or if it is severe and watery. This medicine can make you more sensitive to the sun. Keep out of the sun. If you cannot avoid being in the sun, wear protective clothing and use sunscreen. Do not use sun lamps or tanning beds/booths. What side effects may I notice from receiving this medicine? Side effects that you should report to your doctor or health care professional as soon as possible:  allergic reactions like skin rash, itching or hives, swelling of the face, lips, or tongue  low blood counts - this medicine may decrease the number of white blood cells, red blood cells and platelets. You may be at increased risk for infections and bleeding.  signs of infection - fever or chills, cough, sore  throat, pain or difficulty passing urine  signs of decreased platelets or bleeding - bruising, pinpoint red spots on the skin, black, tarry stools, blood in the urine  signs of decreased red blood cells - unusually weak or tired, fainting spells, lightheadedness  breathing problems  changes in vision  chest pain  mouth sores  nausea and vomiting  pain, swelling, redness at site where injected  pain, tingling, numbness in the hands or feet  redness, swelling, or sores on hands or feet  stomach pain  unusual bleeding Side effects that usually do not require medical attention (report to your doctor or health care professional if they continue or are bothersome):  changes in finger or toe nails  diarrhea  dry or itchy skin  hair loss  headache  loss of appetite  sensitivity of eyes to the light  stomach upset  unusually teary eyes This list may not describe all possible side effects. Call your doctor for medical advice about side effects. You may report side effects to FDA at 1-800-FDA-1088. Where should I keep my medicine? This drug is given in a hospital or clinic and will not be stored at home. NOTE: This sheet is a summary. It may not cover all  possible information. If you have questions about this medicine, talk to your doctor, pharmacist, or health care provider.  2021 Elsevier/Gold Standard (2019-04-18 15:00:03) Bevacizumab injection What is this medicine? BEVACIZUMAB (be va SIZ yoo mab) is a monoclonal antibody. It is used to treat many types of cancer. This medicine may be used for other purposes; ask your health care provider or pharmacist if you have questions. COMMON BRAND NAME(S): Avastin, MVASI, Zirabev What should I tell my health care provider before I take this medicine? They need to know if you have any of these conditions:  diabetes  heart disease  high blood pressure  history of coughing up blood  prior anthracycline chemotherapy (e.g., doxorubicin, daunorubicin, epirubicin)  recent or ongoing radiation therapy  recent or planning to have surgery  stroke  an unusual or allergic reaction to bevacizumab, hamster proteins, mouse proteins, other medicines, foods, dyes, or preservatives  pregnant or trying to get pregnant  breast-feeding How should I use this medicine? This medicine is for infusion into a vein. It is given by a health care professional in a hospital or clinic setting. Talk to your pediatrician regarding the use of this medicine in children. Special care may be needed. Overdosage: If you think you have taken too much of this medicine contact a poison control center or emergency room at once. NOTE: This medicine is only for you. Do not share this medicine with others. What if I miss a dose? It is important not to miss your dose. Call your doctor or health care professional if you are unable to keep an appointment. What may interact with this medicine? Interactions are not expected. This list may not describe all possible interactions. Give your health care provider a list of all the medicines, herbs, non-prescription drugs, or dietary supplements you use. Also tell them if you smoke, drink  alcohol, or use illegal drugs. Some items may interact with your medicine. What should I watch for while using this medicine? Your condition will be monitored carefully while you are receiving this medicine. You will need important blood work and urine testing done while you are taking this medicine. This medicine may increase your risk to bruise or bleed. Call your doctor or health care professional if you notice  any unusual bleeding. Before having surgery, talk to your health care provider to make sure it is ok. This drug can increase the risk of poor healing of your surgical site or wound. You will need to stop this drug for 28 days before surgery. After surgery, wait at least 28 days before restarting this drug. Make sure the surgical site or wound is healed enough before restarting this drug. Talk to your health care provider if questions. Do not become pregnant while taking this medicine or for 6 months after stopping it. Women should inform their doctor if they wish to become pregnant or think they might be pregnant. There is a potential for serious side effects to an unborn child. Talk to your health care professional or pharmacist for more information. Do not breast-feed an infant while taking this medicine and for 6 months after the last dose. This medicine has caused ovarian failure in some women. This medicine may interfere with the ability to have a child. You should talk to your doctor or health care professional if you are concerned about your fertility. What side effects may I notice from receiving this medicine? Side effects that you should report to your doctor or health care professional as soon as possible:  allergic reactions like skin rash, itching or hives, swelling of the face, lips, or tongue  chest pain or chest tightness  chills  coughing up blood  high fever  seizures  severe constipation  signs and symptoms of bleeding such as bloody or black, tarry stools; red or  dark-brown urine; spitting up blood or brown material that looks like coffee grounds; red spots on the skin; unusual bruising or bleeding from the eye, gums, or nose  signs and symptoms of a blood clot such as breathing problems; chest pain; severe, sudden headache; pain, swelling, warmth in the leg  signs and symptoms of a stroke like changes in vision; confusion; trouble speaking or understanding; severe headaches; sudden numbness or weakness of the face, arm or leg; trouble walking; dizziness; loss of balance or coordination  stomach pain  sweating  swelling of legs or ankles  vomiting  weight gain Side effects that usually do not require medical attention (report to your doctor or health care professional if they continue or are bothersome):  back pain  changes in taste  decreased appetite  dry skin  nausea  tiredness This list may not describe all possible side effects. Call your doctor for medical advice about side effects. You may report side effects to FDA at 1-800-FDA-1088. Where should I keep my medicine? This drug is given in a hospital or clinic and will not be stored at home. NOTE: This sheet is a summary. It may not cover all possible information. If you have questions about this medicine, talk to your doctor, pharmacist, or health care provider.  2021 Elsevier/Gold Standard (2019-03-15 10:50:46)

## 2020-09-17 NOTE — Progress Notes (Signed)
Pt d/c stable at 1405 

## 2020-09-19 ENCOUNTER — Inpatient Hospital Stay: Payer: PRIVATE HEALTH INSURANCE

## 2020-09-19 ENCOUNTER — Other Ambulatory Visit: Payer: Self-pay

## 2020-09-19 VITALS — BP 113/78 | HR 113 | Temp 97.7°F | Resp 18 | Ht 68.0 in | Wt 202.8 lb

## 2020-09-19 DIAGNOSIS — C182 Malignant neoplasm of ascending colon: Secondary | ICD-10-CM

## 2020-09-19 DIAGNOSIS — Z5112 Encounter for antineoplastic immunotherapy: Secondary | ICD-10-CM | POA: Diagnosis not present

## 2020-09-19 MED ORDER — HEPARIN SOD (PORK) LOCK FLUSH 100 UNIT/ML IV SOLN
500.0000 [IU] | Freq: Once | INTRAVENOUS | Status: AC | PRN
  Administered 2020-09-19: 500 [IU]
  Filled 2020-09-19: qty 5

## 2020-09-19 MED ORDER — SODIUM CHLORIDE 0.9% FLUSH
10.0000 mL | INTRAVENOUS | Status: DC | PRN
  Administered 2020-09-19: 10 mL
  Filled 2020-09-19: qty 10

## 2020-09-19 NOTE — Patient Instructions (Signed)
[facility] Discharge Instructions for Patients Receiving Chemotherapy  Today you received the following chemotherapy agents FLOUOURACIL  To help prevent nausea and vomiting after your treatment, we encourage you to take your nausea medication.   If you develop nausea and vomiting that is not controlled by your nausea medication, call the clinic.   BELOW ARE SYMPTOMS THAT SHOULD BE REPORTED IMMEDIATELY:  *FEVER GREATER THAN 100.5 F  *CHILLS WITH OR WITHOUT FEVER  NAUSEA AND VOMITING THAT IS NOT CONTROLLED WITH YOUR NAUSEA MEDICATION  *UNUSUAL SHORTNESS OF BREATH  *UNUSUAL BRUISING OR BLEEDING  TENDERNESS IN MOUTH AND THROAT WITH OR WITHOUT PRESENCE OF ULCERS  *URINARY PROBLEMS  *BOWEL PROBLEMS  UNUSUAL RASH Items with * indicate a potential emergency and should be followed up as soon as possible.  Feel free to call the clinic should you have any questions or concerns at The clinic phone number is [phone].  Please show the [facility] at check-in to the Emergency Department and triage nurse.

## 2020-09-24 ENCOUNTER — Encounter: Payer: PRIVATE HEALTH INSURANCE | Attending: Physical Medicine & Rehabilitation | Admitting: Psychology

## 2020-09-24 ENCOUNTER — Other Ambulatory Visit: Payer: Self-pay

## 2020-09-24 DIAGNOSIS — G9389 Other specified disorders of brain: Secondary | ICD-10-CM | POA: Insufficient documentation

## 2020-09-24 DIAGNOSIS — F331 Major depressive disorder, recurrent, moderate: Secondary | ICD-10-CM | POA: Insufficient documentation

## 2020-09-24 DIAGNOSIS — C7931 Secondary malignant neoplasm of brain: Secondary | ICD-10-CM | POA: Insufficient documentation

## 2020-09-24 NOTE — Progress Notes (Signed)
Neuropsychological Consultation   Patient:   Shannon [name]   DOB:   21-Jun-1965  MR Number:  [id number]  Location:  [facility] [place], [place] [id number] [facility] [place] [address] Dept: [phone]           Date of Service:   09/24/2020  Start Time:   8 AM End Time:   10 AM  Today's visit was an in person visit that was conducted in my outpatient clinic office.  The patient myself were present.  1 hour 15 minutes was spent in clinical interview and the other 45 minutes were spent with records review and report writing.  Provider/Observer:  Ilean Skill, Psy.D.       Clinical Neuropsychologist       Billing Code/Service: 10175  Chief Complaint:    Shannon [name] is a 55 year old female with a past medical history that includes anxiety, depression and OCD.  The patient has been followed by psychiatry for this and continues taking Luvox, fluoxetine, Seroquel and they are considering adding Provigil.  Patient was diagnosed with colon cancer in 2018 with resection followed by chemotherapy.  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  The patient is more recently found new metastasis in her lung and GI system.  The patient was referred for neuropsychological consultation for therapeutic interventions.  The patient has had a long history of depression and now after her brain tumor was removed she has continued with radiation therapy and chemotherapy.  Reason for Service:  Shannon [name] is a 55 year old female with a past medical history that includes anxiety, depression and OCD.  The patient has been followed by psychiatry for this and continues taking Luvox, fluoxetine, Seroquel and they are considering adding Provigil.  Patient was diagnosed with colon cancer in 2018 with resection followed  by chemotherapy.  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  The patient is more recently found new metastasis in her lung and GI system.  The patient was referred for neuropsychological consultation for therapeutic interventions.  The patient has had a long history of depression and now after her brain tumor was removed she has continued with radiation therapy and chemotherapy.  The patient reports that she had wanted to get involved in psychotherapeutic interventions and the patient describes her self is having a lifelong history of self sabotaging, anxiety and depression.  The patient is widowed and has stressors of being a single parent.  The patient reports that prior to her brain surgery that she was incapacitated with symptoms including passing out, falling, throwing up and was wheelchair-bound.  She reports that after surgery that she was referred to the inpatient rehabilitation program and has been engaged in outpatient therapies through March and February 2022.  The patient reports that she is continuing to have significant issues with concentration and attention and productivity at work because of fatigue and attentional issues.  She reports that this is causing some financial stressors.  The patient reports that she has a long history of sleep disturbance particularly difficulty falling asleep.  She was prescribed Seroquel (800 mg) at night for sleep and it is helped a great deal for falling asleep but she has difficulty waking up and getting going in the morning.  She describes her appetite is mostly normal.  Memory  includes difficulties with short and long-term memory difficulties.  Psychosocial stressors in the past year including her father passing away in July 2021 and an aunt passing away in December 2021.  Behavioral Observation: Shannon [name]  presents as a 55 y.o.-year-old Right Caucasian Female who appeared  her stated age. her dress was Appropriate and she was Well Groomed and her manners were Appropriate to the situation.  her participation was indicative of Appropriate and Attentive behaviors.  There were physical disabilities noted related to being very careful with her gait and descriptions of balance and coordination issues.  she displayed an appropriate level of cooperation and motivation.     Interactions:    Active Appropriate  Attention:   abnormal and attention span appeared shorter than expected for age  Memory:   within normal limits; recent and remote memory intact  Visuo-spatial:  not examined  Speech (Volume):  normal  Speech:   normal; normal  Thought Process:  Coherent and Relevant  Though Content:  WNL; not suicidal and not homicidal  Orientation:   person, place, time/date and situation  Judgment:   Good  Planning:   Good  Affect:    Depressed  Mood:    Dysphoric  Insight:   Good  Intelligence:   normal  Marital Status/Living: The patient was born and raised in [place] along with 2 siblings.  She is the youngest of the children.  No major childhood illnesses were noted and developmental milestones were reached at the appropriate time.  The patient currently lives with her 1 year old daughter.  The patient was widowed in 2009 after 16 years of marriage.  Her daughter also deals with depression and anxiety that was triggered when the daughter found her father dead at age 52 years old in their home.  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  She has struggled some with her employment as a result of some of the complications from her cancer and chemotherapy treatments and effects it has on attention and concentration and fatigue.  Substance Use:  No concerns of substance abuse are reported.  The patient reports that she has a glass of wine every couple of months and no other substance  use.  Education:   The patient completed her masters degree at [employer] and asked very Theme park manager.  Hobbies and interest in the past included music in church but she reports she does not have a lot of hobbies now.  Medical History:   Past Medical History:  Diagnosis Date  . Anemia    Iron De  . Anxiety   . Blood clot in vein   . Bowel obstruction ([facility])   . Colon cancer ([facility]) 2018   treated with surgery and chemotherapy  . Depression   . Diabetes mellitus without complication ([facility])    Type II  . DVT (deep venous thrombosis) ([facility]) 2019   behind right knee- while she wasa on chemo  . GERD (gastroesophageal reflux disease)   . History of blood transfusion   . History of chemotherapy   . History of kidney stones 2012   passed  . Hypothyroidism   . Obesity          Patient Active Problem List   Diagnosis Date Noted  . Orthostatic hypotension 08/26/2020  . Vascular headache 08/26/2020  . Colon cancer ([facility]) 07/23/2020  . Insomnia 07/05/2020  . Vertigo, central 07/02/2020  .  Cerebellar mass 06/06/2020  . Brain metastases ([facility]) 05/29/2020  . Iron deficiency anemia, unspecified 05/13/2020  . OCD (obsessive compulsive disorder) 03/30/2018              Psychiatric History:  The patient has a long history of anxiety and depression and is being followed by psychiatry with current psychotropic medications including fluoxetine, Luvox and Seroquel at night for sleep.  They are looking at starting Provigil to see if it helps with her attention and concentration issues.  Family Med/Psych History:  Family History  Problem Relation Age of Onset  . Irritable bowel syndrome Mother   . Colon polyps Father   . Breast cancer Maternal Grandmother   . Diabetes Maternal Grandmother   . Diabetes Maternal Grandfather   . Breast cancer Paternal Grandmother   . Colon polyps Maternal Uncle   . Stomach cancer Neg Hx   . Pancreatic cancer Neg Hx   . Esophageal cancer  Neg Hx   . Colon cancer Neg Hx    Impression/DX:  Shannon [name] is a 55 year old female with a past medical history that includes anxiety, depression and OCD.  The patient has been followed by psychiatry for this and continues taking Luvox, fluoxetine, Seroquel and they are considering adding Provigil.  Patient was diagnosed with colon cancer in 2018 with resection followed by chemotherapy.  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  The patient is more recently found new metastasis in her lung and GI system.  The patient was referred for neuropsychological consultation for therapeutic interventions.  The patient has had a long history of depression and now after her brain tumor was removed she has continued with radiation therapy and chemotherapy.   Disposition/Plan:  We have set the patient up for formal therapeutic interventions.  We have scheduled 3 appointments 2 weeks apart initially but because of scheduling issues it is rather delayed before I see her the next time.  We have also placed her on the waiting list denied instructed her to notify our office if there are any acute changes to her status.  Diagnosis:    Brain metastases ([facility])  Cerebellar mass  Major depressive disorder, recurrent episode, moderate ([place] [facility])         Electronically Signed   _______________________ Ilean Skill, Psy.D. Clinical Neuropsychologist

## 2020-09-29 NOTE — Progress Notes (Signed)
Shannon [name]  [address] [place],  [place]  [id number] [phone]  Clinic Day:  09/30/2020  Referring physician: Greig Right, MD   HISTORY OF PRESENT ILLNESS:  The patient is a 55 y.o. female with metastatic colon cancer, which includes spread of disease to her abdominal cavity, lungs, brain and right adrenal gland. She comes in today to go over her CT scans to ascertain her new disease baseline after receiving 4 cycles of FOLFIRI/Avastin.  The patient claims to have toleraed her 4th cycle of treatment fairly well.  She has had slightly more diarrhea, which she attributes to her chemotherapy.  She denies having any new GI or other systemic symptoms which concern her for disease progression.  With respect to her colon cancer history, she is status post a right hemicolectomy in early October 2018, followed by 12 cycles of adjuvant chemotherapy, which were completed in April 2019.  In December 2021, she underwent a left cerebellar metastatectomy, whose pathology was consistent with metastatic colon cancer.  Tumor testing did come back MMR normal.  CT scans recently showed evidence of her cancer being in multiple locations, which has her on palliative chemotherapy at this time.    PHYSICAL EXAM:  Blood pressure 136/80, pulse (!) 105, temperature 98.9 F (37.2 C), resp. rate 16, height 5' 8" (1.727 m), weight 207 lb 6.4 oz (94.1 kg), last menstrual period 02/22/2019, SpO2 94 %. Wt Readings from Last 3 Encounters:  09/30/20 207 lb 6.4 oz (94.1 kg)  09/19/20 202 lb 12 oz (92 kg)  09/17/20 205 lb (93 kg)   Body mass index is 31.54 kg/m. Performance status (ECOG): 1 Physical Exam Constitutional:      Appearance: Normal appearance. She is not ill-appearing.  HENT:     Mouth/Throat:     Mouth: Mucous membranes are moist.     Pharynx: Oropharynx is clear. No oropharyngeal exudate or posterior oropharyngeal erythema.  Cardiovascular:     Rate and Rhythm:  Normal rate and regular rhythm.     Heart sounds: No murmur heard. No friction rub. No gallop.   Pulmonary:     Effort: Pulmonary effort is normal. No respiratory distress.     Breath sounds: Normal breath sounds. No wheezing, rhonchi or rales.  Chest:  Breasts:     Right: No axillary adenopathy or supraclavicular adenopathy.     Left: No axillary adenopathy or supraclavicular adenopathy.    Abdominal:     General: Bowel sounds are normal. There is no distension.     Palpations: Abdomen is soft. There is no mass.     Tenderness: There is no abdominal tenderness.  Musculoskeletal:        General: No swelling.     Right lower leg: No edema.     Left lower leg: No edema.  Lymphadenopathy:     Cervical: No cervical adenopathy.     Upper Body:     Right upper body: No supraclavicular or axillary adenopathy.     Left upper body: No supraclavicular or axillary adenopathy.     Lower Body: No right inguinal adenopathy. No left inguinal adenopathy.  Skin:    General: Skin is warm.     Coloration: Skin is not jaundiced.     Findings: No lesion or rash.  Neurological:     General: No focal deficit present.     Mental Status: She is alert and oriented to person, place, and time. Mental status is at baseline.  Cranial Nerves: Cranial nerves are intact.  Psychiatric:        Mood and Affect: Mood normal.        Behavior: Behavior normal.        Thought Content: Thought content normal.   SCANS:  CT scans of her abdomen/pelvis revealed the following: FINDINGS: CT CHEST FINDINGS  Cardiovascular: Normal caliber thoracic aorta. Normal heart size. Small pericardial effusion is unchanged. Central pulmonary vasculature is unremarkable. RIGHT-sided Port-A-Cath terminates at the caval to atrial junction.  Mediastinum/Nodes: Thoracic inlet structures are normal. No mediastinal lymphadenopathy. No hilar lymphadenopathy. No axillary lymphadenopathy. Esophagus grossly normal.  Lungs/Pleura:  No effusion. No consolidation. No new pulmonary nodule.  Pulmonary nodules in the RIGHT chest:  Image 41/4 1.9 x 1.7 cm pulmonary nodule previously measuring 2.1 x 2.0 cm. Adjacent pulmonary nodule on the same image is now 4 mm as compared to 8 mm.  Image 73/4 11 x 9 mm pulmonary nodule previously approximately 14 mm. Airways are patent.  Musculoskeletal: No acute musculoskeletal process. Spinal degenerative changes in the thoracic spine. Sternum is intact. See below for full musculoskeletal details.  CT ABDOMEN PELVIS FINDINGS  Hepatobiliary: Marked hepatic steatosis. No pericholecystic stranding. No visible biliary duct distension. Area of low attenuation along the anterior porta is larger than on the prior study measuring approximately 3.1 x 1.5 cm appearing more similar to its appearance in January of 2020. This shows a triangular morphologic appearance. Portal vein is patent.  No pericholecystic stranding or biliary duct dilation.  Pancreas: Normal, without mass, inflammation or ductal dilatation.  Spleen: Spleen normal size and contour.  Adrenals/Urinary Tract: Only minimal thickening of the RIGHT adrenal is present in the area of concern on the prior study. The LEFT adrenal gland is normal.  Mild perinephric stranding. No hydronephrosis. Urinary bladder with smooth contours though under distended limiting assessment. No suspicious renal lesion.  Stomach/Bowel: RIGHT hemicolectomy. No acute findings related to bowel. Distal colon is collapsed.  Area of soft tissue near the anastomotic site in the RIGHT upper quadrant is much less conspicuous on today's study measuring approximately 15 x 14 mm in greatest dimension. This is compared to 26 x 17 mm. Other areas of peritoneal nodularity show similar decrease. Mesenteric area on image 82/2 measures 13 x 8 mm as compared to approximately 14 by 13 mm.  Vascular/Lymphatic: Paste that  Reproductive: Post  hysterectomy.  Other: Small ventral hernia containing nonobstructed appearing small bowel loops. This is unchanged.  Musculoskeletal: No acute musculoskeletal finding. No destructive bone process. Spinal degenerative changes.  IMPRESSION: 1. Decreased size of pulmonary nodules in the RIGHT chest and decreased size of mesenteric/peritoneal disease in the RIGHT upper quadrant as discussed. Subtle peritoneal nodularity and other small focus of mesenteric nodularity have either resolved or decreased considerably in size. 2. Area of low attenuation along the anterior porta hepatis is larger than on the prior study measuring approximately 3.1 x 1.5 cm appearing more similar to its appearance in January of 2020. This is likely focal fatty infiltration in shows waxing and waning characteristics as compared to studies dating back to January of 2020. Close attention on follow-up is suggested. Shown to represent focal fat on an MRI of 2021. 3. Marked hepatic steatosis. 4. RIGHT adrenal lesion nearly completely resolved only minimally thickened RIGHT adrenal on the current study. 5. No new or progressive findings.  LABS:    ASSESSMENT & PLAN:  Assessment/Plan:  A 55 y.o. female with metastatic colon cancer.  In clinic today, I  went over her CT scan images with her, for which she could see that her disease has responded well to FOLFIRI/Avastin next week. Overall, the patient clinically appears to be doing well.   She will proceed with her 5th cycle of FOLFIRI/Avastin this week.  With respect to her diarrhea, she knows to use Imodium.  Atropine can also be increased to offset the effects of diarrhea, which is likely coming from her irinotecan.  Otherwise, I will see her back in 2 weeks before she heads into her 6th cycle of FOLFIRI/Avastin.  The patient understands all the plans discussed today and is in agreement with them.     Macarthur Critchley, MD

## 2020-09-30 ENCOUNTER — Other Ambulatory Visit: Payer: Self-pay | Admitting: Oncology

## 2020-09-30 ENCOUNTER — Other Ambulatory Visit: Payer: Self-pay

## 2020-09-30 ENCOUNTER — Telehealth: Payer: Self-pay | Admitting: Oncology

## 2020-09-30 ENCOUNTER — Inpatient Hospital Stay: Payer: PRIVATE HEALTH INSURANCE

## 2020-09-30 ENCOUNTER — Other Ambulatory Visit: Payer: Self-pay | Admitting: Hematology and Oncology

## 2020-09-30 ENCOUNTER — Inpatient Hospital Stay: Payer: PRIVATE HEALTH INSURANCE | Attending: Oncology | Admitting: Oncology

## 2020-09-30 VITALS — BP 136/80 | HR 105 | Temp 98.9°F | Resp 16 | Ht 68.0 in | Wt 207.4 lb

## 2020-09-30 DIAGNOSIS — C182 Malignant neoplasm of ascending colon: Secondary | ICD-10-CM

## 2020-09-30 DIAGNOSIS — C7989 Secondary malignant neoplasm of other specified sites: Secondary | ICD-10-CM | POA: Insufficient documentation

## 2020-09-30 DIAGNOSIS — Z79899 Other long term (current) drug therapy: Secondary | ICD-10-CM | POA: Insufficient documentation

## 2020-09-30 DIAGNOSIS — C189 Malignant neoplasm of colon, unspecified: Secondary | ICD-10-CM | POA: Insufficient documentation

## 2020-09-30 DIAGNOSIS — Z5189 Encounter for other specified aftercare: Secondary | ICD-10-CM | POA: Insufficient documentation

## 2020-09-30 DIAGNOSIS — C7800 Secondary malignant neoplasm of unspecified lung: Secondary | ICD-10-CM | POA: Insufficient documentation

## 2020-09-30 DIAGNOSIS — C7931 Secondary malignant neoplasm of brain: Secondary | ICD-10-CM | POA: Insufficient documentation

## 2020-09-30 DIAGNOSIS — Z86718 Personal history of other venous thrombosis and embolism: Secondary | ICD-10-CM | POA: Insufficient documentation

## 2020-09-30 DIAGNOSIS — I10 Essential (primary) hypertension: Secondary | ICD-10-CM | POA: Insufficient documentation

## 2020-09-30 DIAGNOSIS — Z5111 Encounter for antineoplastic chemotherapy: Secondary | ICD-10-CM | POA: Insufficient documentation

## 2020-09-30 DIAGNOSIS — Z5112 Encounter for antineoplastic immunotherapy: Secondary | ICD-10-CM | POA: Insufficient documentation

## 2020-09-30 DIAGNOSIS — E119 Type 2 diabetes mellitus without complications: Secondary | ICD-10-CM | POA: Insufficient documentation

## 2020-09-30 DIAGNOSIS — Z794 Long term (current) use of insulin: Secondary | ICD-10-CM | POA: Insufficient documentation

## 2020-09-30 DIAGNOSIS — C7971 Secondary malignant neoplasm of right adrenal gland: Secondary | ICD-10-CM | POA: Insufficient documentation

## 2020-09-30 DIAGNOSIS — E039 Hypothyroidism, unspecified: Secondary | ICD-10-CM | POA: Insufficient documentation

## 2020-09-30 LAB — CBC AND DIFFERENTIAL
HCT: 37 (ref 36–46)
Hemoglobin: 12.4 (ref 12.0–16.0)
Neutrophils Absolute: 1.4
Platelets: 136 — AB (ref 150–399)
WBC: 2.7

## 2020-09-30 LAB — CBC
MCV: 98 (ref 81–99)
RBC: 3.77 — AB (ref 3.87–5.11)

## 2020-09-30 NOTE — Telephone Encounter (Signed)
Per 5/2 los next appt scheduled and given to patient 

## 2020-10-01 ENCOUNTER — Inpatient Hospital Stay: Payer: PRIVATE HEALTH INSURANCE

## 2020-10-01 ENCOUNTER — Other Ambulatory Visit: Payer: Self-pay

## 2020-10-01 VITALS — BP 132/82 | HR 95 | Temp 98.5°F | Resp 20 | Ht 68.0 in | Wt 207.8 lb

## 2020-10-01 DIAGNOSIS — C189 Malignant neoplasm of colon, unspecified: Secondary | ICD-10-CM | POA: Diagnosis present

## 2020-10-01 DIAGNOSIS — Z86718 Personal history of other venous thrombosis and embolism: Secondary | ICD-10-CM | POA: Diagnosis not present

## 2020-10-01 DIAGNOSIS — C182 Malignant neoplasm of ascending colon: Secondary | ICD-10-CM

## 2020-10-01 DIAGNOSIS — C7800 Secondary malignant neoplasm of unspecified lung: Secondary | ICD-10-CM | POA: Diagnosis not present

## 2020-10-01 DIAGNOSIS — C7989 Secondary malignant neoplasm of other specified sites: Secondary | ICD-10-CM | POA: Diagnosis not present

## 2020-10-01 DIAGNOSIS — E039 Hypothyroidism, unspecified: Secondary | ICD-10-CM | POA: Diagnosis not present

## 2020-10-01 DIAGNOSIS — C7971 Secondary malignant neoplasm of right adrenal gland: Secondary | ICD-10-CM | POA: Diagnosis not present

## 2020-10-01 DIAGNOSIS — Z5189 Encounter for other specified aftercare: Secondary | ICD-10-CM | POA: Diagnosis not present

## 2020-10-01 DIAGNOSIS — R112 Nausea with vomiting, unspecified: Secondary | ICD-10-CM

## 2020-10-01 DIAGNOSIS — Z79899 Other long term (current) drug therapy: Secondary | ICD-10-CM | POA: Diagnosis not present

## 2020-10-01 DIAGNOSIS — Z5112 Encounter for antineoplastic immunotherapy: Secondary | ICD-10-CM | POA: Diagnosis present

## 2020-10-01 DIAGNOSIS — Z794 Long term (current) use of insulin: Secondary | ICD-10-CM | POA: Diagnosis not present

## 2020-10-01 DIAGNOSIS — I10 Essential (primary) hypertension: Secondary | ICD-10-CM | POA: Diagnosis not present

## 2020-10-01 DIAGNOSIS — Z5111 Encounter for antineoplastic chemotherapy: Secondary | ICD-10-CM | POA: Diagnosis present

## 2020-10-01 DIAGNOSIS — E119 Type 2 diabetes mellitus without complications: Secondary | ICD-10-CM | POA: Diagnosis not present

## 2020-10-01 DIAGNOSIS — C7931 Secondary malignant neoplasm of brain: Secondary | ICD-10-CM | POA: Diagnosis not present

## 2020-10-01 MED ORDER — FLUOROURACIL CHEMO INJECTION 2.5 GM/50ML
400.0000 mg/m2 | Freq: Once | INTRAVENOUS | Status: AC
  Administered 2020-10-01: 850 mg via INTRAVENOUS
  Filled 2020-10-01: qty 17

## 2020-10-01 MED ORDER — SODIUM CHLORIDE 0.9 % IV SOLN
2400.0000 mg/m2 | INTRAVENOUS | Status: DC
  Administered 2020-10-01: 5000 mg via INTRAVENOUS
  Filled 2020-10-01: qty 100

## 2020-10-01 MED ORDER — ATROPINE SULFATE 1 MG/ML IJ SOLN
INTRAMUSCULAR | Status: AC
  Filled 2020-10-01: qty 1

## 2020-10-01 MED ORDER — SODIUM CHLORIDE 0.9 % IV SOLN
5.0000 mg/kg | Freq: Once | INTRAVENOUS | Status: AC
  Administered 2020-10-01: 500 mg via INTRAVENOUS
  Filled 2020-10-01: qty 16

## 2020-10-01 MED ORDER — PALONOSETRON HCL INJECTION 0.25 MG/5ML
0.2500 mg | Freq: Once | INTRAVENOUS | Status: AC
Start: 2020-10-01 — End: 2020-10-01
  Administered 2020-10-01: 0.25 mg via INTRAVENOUS

## 2020-10-01 MED ORDER — LORAZEPAM 2 MG/ML IJ SOLN
1.0000 mg | Freq: Once | INTRAMUSCULAR | Status: AC
Start: 2020-10-01 — End: 2020-10-01
  Administered 2020-10-01: 1 mg via INTRAVENOUS

## 2020-10-01 MED ORDER — SODIUM CHLORIDE 0.9 % IV SOLN
10.0000 mg | Freq: Once | INTRAVENOUS | Status: AC
  Administered 2020-10-01: 10 mg via INTRAVENOUS
  Filled 2020-10-01: qty 10

## 2020-10-01 MED ORDER — ATROPINE SULFATE 1 MG/ML IJ SOLN
0.5000 mg | Freq: Once | INTRAMUSCULAR | Status: AC | PRN
  Administered 2020-10-01: 0.5 mg via INTRAVENOUS

## 2020-10-01 MED ORDER — SODIUM CHLORIDE 0.9 % IV SOLN
Freq: Once | INTRAVENOUS | Status: AC
  Filled 2020-10-01: qty 250

## 2020-10-01 MED ORDER — IRINOTECAN HCL CHEMO INJECTION 100 MG/5ML
180.0000 mg/m2 | Freq: Once | INTRAVENOUS | Status: AC
  Administered 2020-10-01: 380 mg via INTRAVENOUS
  Filled 2020-10-01: qty 19

## 2020-10-01 MED ORDER — SODIUM CHLORIDE 0.9 % IV SOLN
Freq: Once | INTRAVENOUS | Status: AC
Start: 2020-10-01 — End: 2020-10-01
  Filled 2020-10-01: qty 250

## 2020-10-01 MED ORDER — LEUCOVORIN CALCIUM INJECTION 350 MG
400.0000 mg/m2 | Freq: Once | INTRAVENOUS | Status: AC
  Administered 2020-10-01: 836 mg via INTRAVENOUS
  Filled 2020-10-01: qty 25

## 2020-10-01 MED ORDER — LORAZEPAM 2 MG/ML IJ SOLN
INTRAMUSCULAR | Status: AC
  Filled 2020-10-01: qty 1

## 2020-10-01 MED ORDER — PALONOSETRON HCL INJECTION 0.25 MG/5ML
INTRAVENOUS | Status: AC
  Filled 2020-10-01: qty 5

## 2020-10-01 NOTE — Progress Notes (Signed)
1258: Patient requests nausea med. Faythe Casa NP orders ativan 1 mg/given @1306 .  1450: Patient states some relief of nausea able to eat some saltine crackers prior to discharge.PT STABLE AT TIME OF DISCHARGE

## 2020-10-01 NOTE — Patient Instructions (Signed)
[facility]  Discharge Instructions: ?Thank you for choosing [facility] to provide your oncology and hematology care.  ?If you have a lab appointment with the Cancer Center, please go directly to the Cancer Center and check in at the registration area. ?  ?Wear comfortable clothing and clothing appropriate for easy access to any Portacath or PICC line.  ? ?We strive to give you quality time with your provider. You may need to reschedule your appointment if you arrive late (15 or more minutes).  Arriving late affects you and other patients whose appointments are after yours.  Also, if you miss three or more appointments without notifying the office, you may be dismissed from the clinic at the provider?s discretion.    ?  ?For prescription refill requests, have your pharmacy contact our office and allow 72 hours for refills to be completed.   ? ?Today you received the following chemotherapy and/or immunotherapy agents Fluorouracil,Leucovorin,Bevacizumab   ?  ?To help prevent nausea and vomiting after your treatment, we encourage you to take your nausea medication as directed. ? ?BELOW ARE SYMPTOMS THAT SHOULD BE REPORTED IMMEDIATELY: ?*FEVER GREATER THAN 100.4 F (38 ?C) OR HIGHER ?*CHILLS OR SWEATING ?*NAUSEA AND VOMITING THAT IS NOT CONTROLLED WITH YOUR NAUSEA MEDICATION ?*UNUSUAL SHORTNESS OF BREATH ?*UNUSUAL BRUISING OR BLEEDING ?*URINARY PROBLEMS (pain or burning when urinating, or frequent urination) ?*BOWEL PROBLEMS (unusual diarrhea, constipation, pain near the anus) ?TENDERNESS IN MOUTH AND THROAT WITH OR WITHOUT PRESENCE OF ULCERS (sore throat, sores in mouth, or a toothache) ?UNUSUAL RASH, SWELLING OR PAIN  ?UNUSUAL VAGINAL DISCHARGE OR ITCHING  ? ?Items with * indicate a potential emergency and should be followed up as soon as possible or go to the Emergency Department if any problems should occur. ? ?Please show the CHEMOTHERAPY ALERT CARD or IMMUNOTHERAPY ALERT  CARD at check-in to the Emergency Department and triage nurse. ? ?Should you have questions after your visit or need to cancel or reschedule your appointment, please contact [facility]  Dept: [phone]  and follow the prompts.  Office hours are 8:00 a.m. to 4:30 p.m. Monday - Friday. Please note that voicemails left after 4:00 p.m. may not be returned until the following business day.  We are closed weekends and major holidays. You have access to a nurse at all times for urgent questions. Please call the main number to the clinic Dept: [phone] and follow the prompts. ? ?For any non-urgent questions, you may also contact your provider using MyChart. We now offer e-Visits for anyone 18 and older to request care online for non-urgent symptoms. For details visit [email or web address]. ?  ?Also download the MyChart app! Go to the app store, search "MyChart", open the app, select [facility], and log in with your MyChart username and password. ? ?Due to Covid, a mask is required upon entering the hospital/clinic. If you do not have a mask, one will be given to you upon arrival. For doctor visits, patients may have 1 support person aged 18 or older with them. For treatment visits, patients cannot have anyone with them due to current Covid guidelines and our immunocompromised population.  ? ? ?

## 2020-10-03 ENCOUNTER — Other Ambulatory Visit: Payer: Self-pay

## 2020-10-03 ENCOUNTER — Inpatient Hospital Stay: Payer: PRIVATE HEALTH INSURANCE

## 2020-10-03 VITALS — BP 101/66 | HR 117 | Temp 98.5°F | Resp 16 | Wt 190.1 lb

## 2020-10-03 DIAGNOSIS — C182 Malignant neoplasm of ascending colon: Secondary | ICD-10-CM

## 2020-10-03 DIAGNOSIS — Z5112 Encounter for antineoplastic immunotherapy: Secondary | ICD-10-CM | POA: Diagnosis not present

## 2020-10-03 MED ORDER — HEPARIN SOD (PORK) LOCK FLUSH 100 UNIT/ML IV SOLN
500.0000 [IU] | Freq: Once | INTRAVENOUS | Status: AC | PRN
  Administered 2020-10-03: 500 [IU]
  Filled 2020-10-03: qty 5

## 2020-10-03 NOTE — Progress Notes (Signed)
Pt d/c stable at 1430

## 2020-10-03 NOTE — Patient Instructions (Signed)
Fluorouracil, 5-FU injection What is this medicine? FLUOROURACIL, 5-FU (flure oh YOOR a sil) is a chemotherapy drug. It slows the growth of cancer cells. This medicine is used to treat many types of cancer like breast cancer, colon or rectal cancer, pancreatic cancer, and stomach cancer. This medicine may be used for other purposes; ask your health care provider or pharmacist if you have questions. COMMON BRAND NAME(S): Adrucil What should I tell my health care provider before I take this medicine? They need to know if you have any of these conditions:  blood disorders  dihydropyrimidine dehydrogenase (DPD) deficiency  infection (especially a virus infection such as chickenpox, cold sores, or herpes)  kidney disease  liver disease  malnourished, poor nutrition  recent or ongoing radiation therapy  an unusual or allergic reaction to fluorouracil, other chemotherapy, other medicines, foods, dyes, or preservatives  pregnant or trying to get pregnant  breast-feeding How should I use this medicine? This drug is given as an infusion or injection into a vein. It is administered in a hospital or clinic by a specially trained health care professional. Talk to your pediatrician regarding the use of this medicine in children. Special care may be needed. Overdosage: If you think you have taken too much of this medicine contact a poison control center or emergency room at once. NOTE: This medicine is only for you. Do not share this medicine with others. What if I miss a dose? It is important not to miss your dose. Call your doctor or health care professional if you are unable to keep an appointment. What may interact with this medicine? Do not take this medicine with any of the following medications:  live virus vaccines This medicine may also interact with the following medications:  medicines that treat or prevent blood clots like warfarin, enoxaparin, and dalteparin This list may not  describe all possible interactions. Give your health care provider a list of all the medicines, herbs, non-prescription drugs, or dietary supplements you use. Also tell them if you smoke, drink alcohol, or use illegal drugs. Some items may interact with your medicine. What should I watch for while using this medicine? Visit your doctor for checks on your progress. This drug may make you feel generally unwell. This is not uncommon, as chemotherapy can affect healthy cells as well as cancer cells. Report any side effects. Continue your course of treatment even though you feel ill unless your doctor tells you to stop. In some cases, you may be given additional medicines to help with side effects. Follow all directions for their use. Call your doctor or health care professional for advice if you get a fever, chills or sore throat, or other symptoms of a cold or flu. Do not treat yourself. This drug decreases your body's ability to fight infections. Try to avoid being around people who are sick. This medicine may increase your risk to bruise or bleed. Call your doctor or health care professional if you notice any unusual bleeding. Be careful brushing and flossing your teeth or using a toothpick because you may get an infection or bleed more easily. If you have any dental work done, tell your dentist you are receiving this medicine. Avoid taking products that contain aspirin, acetaminophen, ibuprofen, naproxen, or ketoprofen unless instructed by your doctor. These medicines may hide a fever. Do not become pregnant while taking this medicine. Women should inform their doctor if they wish to become pregnant or think they might be pregnant. There is a potential   for serious side effects to an unborn child. Talk to your health care professional or pharmacist for more information. Do not breast-feed an infant while taking this medicine. Men should inform their doctor if they wish to father a child. This medicine may  lower sperm counts. Do not treat diarrhea with over the counter products. Contact your doctor if you have diarrhea that lasts more than 2 days or if it is severe and watery. This medicine can make you more sensitive to the sun. Keep out of the sun. If you cannot avoid being in the sun, wear protective clothing and use sunscreen. Do not use sun lamps or tanning beds/booths. What side effects may I notice from receiving this medicine? Side effects that you should report to your doctor or health care professional as soon as possible:  allergic reactions like skin rash, itching or hives, swelling of the face, lips, or tongue  low blood counts - this medicine may decrease the number of white blood cells, red blood cells and platelets. You may be at increased risk for infections and bleeding.  signs of infection - fever or chills, cough, sore throat, pain or difficulty passing urine  signs of decreased platelets or bleeding - bruising, pinpoint red spots on the skin, black, tarry stools, blood in the urine  signs of decreased red blood cells - unusually weak or tired, fainting spells, lightheadedness  breathing problems  changes in vision  chest pain  mouth sores  nausea and vomiting  pain, swelling, redness at site where injected  pain, tingling, numbness in the hands or feet  redness, swelling, or sores on hands or feet  stomach pain  unusual bleeding Side effects that usually do not require medical attention (report to your doctor or health care professional if they continue or are bothersome):  changes in finger or toe nails  diarrhea  dry or itchy skin  hair loss  headache  loss of appetite  sensitivity of eyes to the light  stomach upset  unusually teary eyes This list may not describe all possible side effects. Call your doctor for medical advice about side effects. You may report side effects to FDA at 1-800-FDA-1088. Where should I keep my medicine? This  drug is given in a hospital or clinic and will not be stored at home. NOTE: This sheet is a summary. It may not cover all possible information. If you have questions about this medicine, talk to your doctor, pharmacist, or health care provider.  2021 [employer] (2019-04-18 15:00:03)

## 2020-10-04 ENCOUNTER — Encounter: Payer: Self-pay | Admitting: Oncology

## 2020-10-04 ENCOUNTER — Ambulatory Visit
Admission: RE | Admit: 2020-10-04 | Discharge: 2020-10-04 | Disposition: A | Discharge status: Home / Self Care | Payer: PRIVATE HEALTH INSURANCE | Source: Ambulatory Visit | Attending: Internal Medicine | Admitting: Internal Medicine

## 2020-10-04 DIAGNOSIS — C7931 Secondary malignant neoplasm of brain: Secondary | ICD-10-CM

## 2020-10-04 MED ORDER — GADOBENATE DIMEGLUMINE 529 MG/ML IV SOLN
20.0000 mL | Freq: Once | INTRAVENOUS | Status: AC | PRN
  Administered 2020-10-04: 20 mL via INTRAVENOUS

## 2020-10-07 ENCOUNTER — Inpatient Hospital Stay: Payer: PRIVATE HEALTH INSURANCE

## 2020-10-07 ENCOUNTER — Other Ambulatory Visit: Payer: Self-pay

## 2020-10-07 ENCOUNTER — Ambulatory Visit: Payer: PRIVATE HEALTH INSURANCE | Admitting: Internal Medicine

## 2020-10-07 VITALS — BP 108/56 | HR 105 | Temp 98.1°F | Resp 18 | Wt 191.0 lb

## 2020-10-07 DIAGNOSIS — C182 Malignant neoplasm of ascending colon: Secondary | ICD-10-CM

## 2020-10-07 DIAGNOSIS — Z5112 Encounter for antineoplastic immunotherapy: Secondary | ICD-10-CM | POA: Diagnosis not present

## 2020-10-07 MED ORDER — FILGRASTIM-SNDZ 480 MCG/0.8ML IJ SOSY
PREFILLED_SYRINGE | INTRAMUSCULAR | Status: AC
  Filled 2020-10-07: qty 0.8

## 2020-10-07 MED ORDER — FILGRASTIM-SNDZ 480 MCG/0.8ML IJ SOSY
480.0000 ug | PREFILLED_SYRINGE | Freq: Once | INTRAMUSCULAR | Status: AC
Start: 2020-10-07 — End: 2020-10-07
  Administered 2020-10-07: 480 ug via SUBCUTANEOUS

## 2020-10-07 NOTE — Patient Instructions (Signed)

## 2020-10-08 ENCOUNTER — Inpatient Hospital Stay: Payer: PRIVATE HEALTH INSURANCE

## 2020-10-08 ENCOUNTER — Inpatient Hospital Stay (HOSPITAL_BASED_OUTPATIENT_CLINIC_OR_DEPARTMENT_OTHER): Payer: PRIVATE HEALTH INSURANCE | Admitting: Internal Medicine

## 2020-10-08 VITALS — BP 111/78 | HR 119 | Temp 97.8°F | Resp 20 | Ht 68.0 in | Wt 204.5 lb

## 2020-10-08 VITALS — BP 122/85 | HR 108 | Temp 97.9°F | Resp 18 | Ht 68.0 in | Wt 202.0 lb

## 2020-10-08 DIAGNOSIS — C7931 Secondary malignant neoplasm of brain: Secondary | ICD-10-CM | POA: Diagnosis not present

## 2020-10-08 DIAGNOSIS — Z5112 Encounter for antineoplastic immunotherapy: Secondary | ICD-10-CM | POA: Diagnosis not present

## 2020-10-08 DIAGNOSIS — C182 Malignant neoplasm of ascending colon: Secondary | ICD-10-CM

## 2020-10-08 MED ORDER — FILGRASTIM-SNDZ 480 MCG/0.8ML IJ SOSY
480.0000 ug | PREFILLED_SYRINGE | Freq: Once | INTRAMUSCULAR | Status: AC
Start: 2020-10-08 — End: 2020-10-08
  Administered 2020-10-08: 480 ug via SUBCUTANEOUS

## 2020-10-08 NOTE — Patient Instructions (Signed)

## 2020-10-08 NOTE — Progress Notes (Signed)
Shannon [name]  [address] [place],  [place]  [address] [phone]  Clinic Day:  10/11/2020  Referring physician: Greig Right, MD  This document serves as a record of services personally performed by Dequincy Macarthur Critchley, MD. It was created on their behalf by [facility] E, a trained medical scribe. The creation of this record is based on the scribe's personal observations and the provider's statements to them.  HISTORY OF PRESENT ILLNESS:  The patient is a 55 y.o. female with metastatic colon cancer, which includes spread of disease to her abdominal cavity, lungs, brain and right adrenal gland. She comes in today to be evaluated before heading into her 6th cycle of FOLFIRI/Avastin.  The patient claims to have toleraed her 5th cycle of treatment fairly well.  She has had slightly more diarrhea, which she attributes to her chemotherapy.  However, she does not take Imodium like she should.  She also complains of mouth soreness/sensitivity, but denies having having any obvious mouth sores.  She denies having any new GI or other systemic symptoms which concern her for disease progression. Unfortunately, a recent brain MRI showed a new lesion in her right cerebellum for which she is scheduled to undergo stereotactic radiation next week.  With respect to her colon cancer history, she is status post a right hemicolectomy in early October 2018, followed by 12 cycles of adjuvant chemotherapy, which were completed in April 2019.  In December 2021, she underwent a left cerebellar metastatectomy, whose pathology was consistent with metastatic colon cancer.  Tumor testing did come back MMR normal.  CT scans recently showed evidence of her cancer being in multiple locations, which has her on palliative chemotherapy at this time.    PHYSICAL EXAM:  Blood pressure (!) 135/91, pulse (!) 114, temperature (!) 93.2 F (34 C), resp. rate 16, height 5' 8"  (1.727 m), weight 205 lb 1.6  oz (93 kg), last menstrual period 02/22/2019, SpO2 95 %. Wt Readings from Last 3 Encounters:  10/11/20 205 lb 1.6 oz (93 kg)  10/10/20 204 lb 12.8 oz (92.9 kg)  10/08/20 202 lb (91.6 kg)   Body mass index is 31.19 kg/m. Performance status (ECOG): 1 Physical Exam Constitutional:      Appearance: Normal appearance. She is not ill-appearing.  HENT:     Mouth/Throat:     Mouth: Mucous membranes are moist.     Pharynx: Oropharynx is clear. No oropharyngeal exudate or posterior oropharyngeal erythema.  Cardiovascular:     Rate and Rhythm: Normal rate and regular rhythm.     Heart sounds: No murmur heard. No friction rub. No gallop.   Pulmonary:     Effort: Pulmonary effort is normal. No respiratory distress.     Breath sounds: Normal breath sounds. No wheezing, rhonchi or rales.  Chest:  Breasts:     Right: No axillary adenopathy or supraclavicular adenopathy.     Left: No axillary adenopathy or supraclavicular adenopathy.    Abdominal:     General: Bowel sounds are normal. There is no distension.     Palpations: Abdomen is soft. There is no mass.     Tenderness: There is no abdominal tenderness.  Musculoskeletal:        General: No swelling.     Right lower leg: No edema.     Left lower leg: No edema.  Lymphadenopathy:     Cervical: No cervical adenopathy.     Upper Body:     Right upper body: No supraclavicular  or axillary adenopathy.     Left upper body: No supraclavicular or axillary adenopathy.     Lower Body: No right inguinal adenopathy. No left inguinal adenopathy.  Skin:    General: Skin is warm.     Coloration: Skin is not jaundiced.     Findings: No lesion or rash.  Neurological:     General: No focal deficit present.     Mental Status: She is alert and oriented to person, place, and time. Mental status is at baseline.     Cranial Nerves: Cranial nerves are intact.  Psychiatric:        Mood and Affect: Mood normal.        Behavior: Behavior normal.         Thought Content: Thought content normal.     LABS:   Hematology 10/11/2020  WBC 4.0  Absolute Neutrophils 1.8  RBC 3.84 (A)  Hemoglobin 12.7  Hematocrit 38  Platelets 160   Chemistry 10/11/2020  Sodium 136 (A)  Potassium 3.7  BUN 14  Creatinine 1.0  Glucose 329 (A)  Calcium 8.5  Albumin 4.1  AST 27  ALT 31  Alkaline Phosphatase 166 (A)  Total Bilirubin 0.4    ASSESSMENT & PLAN:  Assessment/Plan:  A 55 y.o. female with metastatic colon cancer.  She will proceed with her 6th cycle of FOLFIRI/Avastin next week.  Clinically, she appears to be doing okay, despite having a new brain metastasis.  As mentioned previously, she is scheduled for stereotactic radiation next week.  I will see her back in 2 weeks before she heads into her 7th cycle of FOLFIRI/Avastin.  The patient understands all the plans discussed today and is in agreement with them.     I, Rita Ohara, am acting as scribe for Marice Potter, MD    I have reviewed this report as typed by the medical scribe, and it is complete and accurate.  Dequincy Macarthur Critchley, MD

## 2020-10-08 NOTE — Progress Notes (Signed)
[facility] [place], [place] [id number] [phone]   Interval Evaluation  Date of Service: 10/08/20 Patient Name: Shannon [name] Patient MRN: [id number] Patient DOB: 1965-10-31 Provider: Ventura Sellers, MD  Identifying Statement:  Shannon [name] is a 55 y.o. female with Brain metastases [facility]) [C79.31]   Primary Cancer: Colon Adenocarcinoma, Stage IV  Oncologic History: 05/29/20: Sub-occipital craniotomy, resection by Dr. Saintclair Halsted.  Path c/w colon adenocarcinoma 07/08/20: Completes fractionated SRS to resection cavity w/ Dr. Isidore Moos  Interval History:  Shannon [name] presents today for follow up after recent MRI brain.  She describes clear increase in fatigue and lethargy, now having dosed 5 cycles of FOLFIRI and avastin with Dr. Bobby Rumpf.  She feels her balance is "a little worse" but she is still walking on her own, maintains full independence.  She is mainly limited by poor energy, easy exhaustion.  No changes to medications since prior visit.   H+P (07/02/20) Patient presented to medical attention at the end of December, 2021 with several weeks of progressive balance impairment.  She worsened toward the end to the point of needing full assist with walking, or being mostly bed-bound.  At the same time she developed bouts of vertigo, described as "room spinning around me", for which medical interventions were not effective.  CNS imaging demonstrated large enhancing mass in left cerebellar hemisphere with swelling and compression c/w likely brain metastasis; she underwent craniotomy and resection on 05/29/20 with Dr. Saintclair Halsted.  Following surgery, her symptoms improved considerably.  At present, she is functionally intact aside from issues with short term memory, fatigue, and mood.  Visited with radiation oncology today for post-operative simulation.   Medications: Current Outpatient Medications on File Prior to Visit   Medication Sig Dispense Refill  . acetaminophen (TYLENOL) 325 MG tablet Take 2 tablets (650 mg total) by mouth every 6 (six) hours as needed for mild pain.    Marland Kitchen atorvastatin (LIPITOR) 10 MG tablet Take 1 tablet (10 mg total) by mouth at bedtime. 30 tablet 0  . CONTOUR NEXT TEST test strip 3 (three) times daily.    . Dulaglutide (TRULICITY) 1.5 0000000 SOPN Inject into the skin.    . fluvoxaMINE (LUVOX) 100 MG tablet Take 3 tablets (300 mg total) by mouth at bedtime. 270 tablet 1  . hyoscyamine (LEVSIN SL) 0.125 MG SL tablet Place 1 tablet (0.125 mg total) under the tongue every 6 (six) hours as needed. 45 tablet 1  . insulin aspart (NOVOLOG FLEXPEN) 100 UNIT/ML FlexPen Inject 8 Units into the skin 3 (three) times daily with meals. 15 mL 11  . insulin glargine (LANTUS) 100 UNIT/ML Solostar Pen Inject 26 Units into the skin daily. 15 mL 11  . lamoTRIgine (LAMICTAL) 150 MG tablet Take 1 tablet (150 mg total) by mouth daily. 90 tablet 3  . levothyroxine (SYNTHROID) 75 MCG tablet Take 1 tablet (75 mcg total) by mouth daily. 30 tablet 0  . metFORMIN (GLUCOPHAGE) 500 MG tablet Take 1 tablet (500 mg total) by mouth 2 (two) times daily with a meal. 60 tablet 0  . metroNIDAZOLE (FLAGYL) 250 MG tablet  (Patient not taking: Reported on 09/16/2020)    . modafinil (PROVIGIL) 200 MG tablet 1/2 tablet each morning for 1 week, then 1 each AM 30 tablet 1  . NOVOFINE PEN NEEDLE 32G X 6 MM MISC SMARTSIG:Syringe(s) SUB-Q    . ondansetron (ZOFRAN) 4 MG tablet Take 1 tablet (  4 mg total) by mouth every 4 (four) hours as needed for nausea. 90 tablet 3  . pantoprazole (PROTONIX) 40 MG tablet TAKE 1 TABLET BY MOUTH EVERYDAY AT BEDTIME 30 tablet 0  . prochlorperazine (COMPAZINE) 10 MG tablet Take 1 tablet (10 mg total) by mouth every 6 (six) hours as needed for nausea or vomiting. 90 tablet 3  . QUEtiapine (SEROQUEL) 400 MG tablet Take 2 tablets (800 mg total) by mouth at bedtime. 30 tablet 0  . senna-docusate (SENOKOT-S)  8.6-50 MG tablet Take 2 tablets by mouth at bedtime. (Patient not taking: Reported on 09/16/2020)    . zolpidem (AMBIEN) 5 MG tablet Take 1 tablet (5 mg total) by mouth at bedtime as needed for sleep. 30 tablet 0   No current facility-administered medications on file prior to visit.    Allergies:  Allergies  Allergen Reactions  . Clindamycin/Lincomycin Rash  . Penicillins Rash    Reaction: 10 years   Past Medical History:  Past Medical History:  Diagnosis Date  . Anemia    Iron De  . Anxiety   . Blood clot in vein   . Bowel obstruction ([place])   . Colon cancer ([place]) 2018   treated with surgery and chemotherapy  . Depression   . Diabetes mellitus without complication ([place])    Type II  . DVT (deep venous thrombosis) ([place]) 2019   behind right knee- while she wasa on chemo  . GERD (gastroesophageal reflux disease)   . History of blood transfusion   . History of chemotherapy   . History of kidney stones 2012   passed  . Hypothyroidism   . Obesity    Past Surgical History:  Past Surgical History:  Procedure Laterality Date  . ABDOMINAL HYSTERECTOMY  05/2019   total laparoscopin with tubes and ovariers removed also.  . APPENDECTOMY  05/2019  . COLON SURGERY  03/2017   Colon resection  . COLONOSCOPY  2019  . SUBOCCIPITAL CRANIECTOMY CERVICAL LAMINECTOMY Left 05/29/2020   Procedure: Suboccipital Craniectomy for Resection of Left Cerebellar Mass;  Surgeon: Kary Kos, MD;  Location: [facility];  Service: Neurosurgery;  Laterality: Left;   Social History:  Social History   Socioeconomic History  . Marital status: Widowed    Spouse name: Not on file  . Number of children: Not on file  . Years of education: Not on file  . Highest education level: Not on file  Occupational History  . Occupation: care coordinator   Tobacco Use  . Smoking status: Never Smoker  . Smokeless tobacco: Never Used  Vaping Use  . Vaping Use: Never used  Substance and Sexual Activity  . Alcohol use:  Yes    Comment: rare  . Drug use: Never  . Sexual activity: Not Currently  Other Topics Concern  . Not on file  Social History Narrative  . Not on file   Social Determinants of Health   Financial Resource Strain: Not on file  Food Insecurity: Not on file  Transportation Needs: Not on file  Physical Activity: Not on file  Stress: Not on file  Social Connections: Not on file  Intimate Partner Violence: Not on file   Family History:  Family History  Problem Relation Age of Onset  . Irritable bowel syndrome Mother   . Colon polyps Father   . Breast cancer Maternal Grandmother   . Diabetes Maternal Grandmother   . Diabetes Maternal Grandfather   . Breast cancer Paternal Grandmother   . Colon polyps  Maternal Uncle   . Stomach cancer Neg Hx   . Pancreatic cancer Neg Hx   . Esophageal cancer Neg Hx   . Colon cancer Neg Hx     Review of Systems: Constitutional: +Fatigue Eyes: Doesn't report blurriness of vision Ears, nose, mouth, throat, and face: Doesn't report sore throat Respiratory: Doesn't report cough, dyspnea or wheezes Cardiovascular: Doesn't report palpitation, chest discomfort  Gastrointestinal:  Doesn't report nausea, constipation, diarrhea GU: Doesn't report incontinence Skin: Doesn't report skin rashes Neurological: Per HPI Musculoskeletal: Doesn't report joint pain Behavioral/Psych: +mood issues, depression  Physical Exam: Vitals:   10/08/20 1206  BP: 111/78  Pulse: (!) 119  Resp: 20  Temp: 97.8 F (36.6 C)  SpO2: 97%   KPS: 90. General: Alert, cooperative, pleasant, in no acute distress Head: Normal EENT: No conjunctival injection or scleral icterus.  Lungs: Resp effort normal Cardiac: Regular rate Abdomen: Non-distended abdomen Skin: No rashes cyanosis or petechiae. Extremities: No clubbing or edema  Neurologic Exam: Mental Status: Awake, alert, attentive to examiner. Oriented to self and environment. Language is fluent with intact  comprehension.  Cranial Nerves: Visual acuity is grossly normal. Visual fields are full. Extra-ocular movements intact. No ptosis. Face is symmetric Motor: Tone and bulk are normal. Power is full in both arms and legs. Reflexes are symmetric, no pathologic reflexes present.  Sensory: Intact to light touch Gait: Normal.   Labs: I have reviewed the data as listed    Component Value Date/Time   NA 137 09/16/2020 0000   K 4.5 09/16/2020 0000   CL 104 09/16/2020 0000   CO2 22 09/16/2020 0000   GLUCOSE 234 (H) 06/07/2020 0525   BUN 16 09/16/2020 0000   CREATININE 1.1 09/16/2020 0000   CREATININE 0.97 06/07/2020 0525   CALCIUM 8.9 09/16/2020 0000   PROT 5.2 (L) 06/07/2020 0525   ALBUMIN 4.1 09/16/2020 0000   AST 32 09/16/2020 0000   ALT 34 09/16/2020 0000   ALKPHOS 128 (A) 08/30/2020 0000   BILITOT 0.3 06/07/2020 0525   GFRNONAA >60 06/07/2020 0525   Lab Results  Component Value Date   WBC 2.7 09/30/2020   NEUTROABS 1.40 09/30/2020   HGB 12.4 09/30/2020   HCT 37 09/30/2020   MCV 98 09/30/2020   PLT 136 (A) 09/30/2020   Imaging:  [facility] Interpretation: I have personally reviewed the CNS images as listed.  My interpretation, in the context of the patient's clinical presentation, is progressive disease  MR BRAIN W WO CONTRAST  Result Date: 10/05/2020 CLINICAL DATA:  Brain/CNS neoplasm. Assess treatment response. Status post SRS. Craniotomy. EXAM: MRI HEAD WITHOUT AND WITH CONTRAST TECHNIQUE: Multiplanar, multiecho pulse sequences of the brain and surrounding structures were obtained without and with intravenous contrast. CONTRAST:  16mL MULTIHANCE GADOBENATE DIMEGLUMINE 529 MG/ML IV SOLN COMPARISON:  None. FINDINGS: Brain: Previously noted enhancement in the surgical cavity the left posterior fossa is essentially resolved. Enhancement is now predominantly along the inferior margin of the cavity. This will serve as a baseline. A new ring-enhancing lesion is present in the right  cerebellum measuring up to 8 mm on image 36 of series 11. No additional lesions are present. No hemorrhage or infarct is present. Supratentorial brain demonstrates mild atrophy and white matter disease. This may be related to treatment. White matter changes demonstrate some progression. Vascular: Flow is present in the major intracranial arteries. Skull and upper cervical spine: The craniocervical junction is normal. Upper cervical spine is within normal limits. Marrow signal is unremarkable. Sinuses/Orbits:  The paranasal sinuses and mastoid air cells are clear. The globes and orbits are within normal limits. IMPRESSION: 1. New 8 mm ring-enhancing lesion in the right cerebellum consistent with a new metastasis. 2. Postoperative changes of the left cerebellum. Enhancement predominantly along the inferior margin of the cavity is likely postoperative in nature. Residual or recurrent tumor is less likely. This will serve as a baseline. 3. Slight progression of white matter disease. This is likely related to treatment. Electronically Signed   By: San Morelle M.D.   On: 10/05/2020 11:36   Assessment/Plan Brain metastases [facility]) [C79.31]   Tiye Huwe presents today with global decline in function, most consistent with systemic response to chemotherapy regimen.  She does not demonstrate new or progressive focal neurologic deficits today.    Brain MRI demonstrates improvement in contrast enhancement and inflammation surrounding left cerebellar resection cavity.  Unfortunately, a new metastasis is visualized within the right cerebellar hemisphere.    We recommended proceeding with salvage radiosurgery, reviewing risks and benefits.  She was agreeable to this plan.   We appreciate the opportunity to participate in the care of Chrisma Hurlock.  We ask that Sereen Schaff return to clinic in 3 months following post-SRS brain MRI, or sooner as needed.  All questions were  answered. The patient knows to call the clinic with any problems, questions or concerns. No barriers to learning were detected.  The total time spent in the encounter was 30 minutes and more than 50% was on counseling and review of test results   Ventura Sellers, MD Medical Director of Neuro-Oncology [facility] 10/08/20 12:04 PM

## 2020-10-09 NOTE — Progress Notes (Signed)
Has armband been applied?  Yes  Does patient have an allergy to IV contrast dye?: No   Has patient ever received premedication for IV contrast dye?: No  Does patient take metformin?: Yes  If patient does take metformin when was the last dose: Has not taken it in a week.  Date of lab work: 09/27/2020 BUN: 13 CR: 1.00 eGfr: 58  IV site: Right Chest Port  Has IV site been added to flowsheet?  Yes  BP 110/70 (BP Location: Left Arm)   Pulse (!) 117   Temp 97.7 F (36.5 C)   Resp 18   LMP 02/22/2019 (Approximate)   SpO2 97%

## 2020-10-10 ENCOUNTER — Ambulatory Visit
Admission: RE | Admit: 2020-10-10 | Discharge: 2020-10-10 | Disposition: A | Discharge status: Home / Self Care | Payer: PRIVATE HEALTH INSURANCE | Source: Ambulatory Visit | Attending: Radiation Oncology | Admitting: Radiation Oncology

## 2020-10-10 ENCOUNTER — Ambulatory Visit: Payer: PRIVATE HEALTH INSURANCE | Admitting: Radiation Oncology

## 2020-10-10 ENCOUNTER — Other Ambulatory Visit: Payer: Self-pay | Admitting: Internal Medicine

## 2020-10-10 ENCOUNTER — Other Ambulatory Visit: Payer: Self-pay

## 2020-10-10 ENCOUNTER — Encounter: Payer: Self-pay | Admitting: Radiation Oncology

## 2020-10-10 VITALS — BP 110/70 | HR 117 | Temp 97.7°F | Resp 18

## 2020-10-10 VITALS — BP 110/70 | HR 117 | Temp 97.7°F | Resp 18 | Ht 68.0 in | Wt 204.8 lb

## 2020-10-10 DIAGNOSIS — C7931 Secondary malignant neoplasm of brain: Secondary | ICD-10-CM

## 2020-10-10 DIAGNOSIS — C182 Malignant neoplasm of ascending colon: Secondary | ICD-10-CM

## 2020-10-10 MED ORDER — HEPARIN SOD (PORK) LOCK FLUSH 100 UNIT/ML IV SOLN
500.0000 [IU] | Freq: Once | INTRAVENOUS | Status: AC
  Administered 2020-10-10: 500 [IU] via INTRAVENOUS

## 2020-10-10 MED ORDER — SODIUM CHLORIDE 0.9% FLUSH
10.0000 mL | Freq: Once | INTRAVENOUS | Status: AC
Start: 2020-10-10 — End: 2020-10-10
  Administered 2020-10-10: 10 mL via INTRAVENOUS

## 2020-10-10 NOTE — Progress Notes (Signed)
Radiation Oncology         [phone] ________________________________  Initial Outpatient Consultation - Conducted via telephone due to current COVID-19 concerns for limiting patient exposure  I spoke with the patient to conduct this consult visit via telephone to spare the patient unnecessary potential exposure in the healthcare setting during the current COVID-19 pandemic. The patient was notified in advance and was offered a [place] meeting to allow for face to face communication but unfortunately reported that they did not have the appropriate resources/technology to support such a visit and instead preferred to proceed with a telephone consult.    Name: Shannon [name]        MRN: [id number]  Date of Service: 10/10/2020 DOB: 07/15/1965  [place]:[id number], Olin Hauser, MD  Greig Right, MD     REFERRING PHYSICIAN: Greig Right, MD   DIAGNOSIS: The primary encounter diagnosis was Metastasis to brain [facility]). A diagnosis of Brain metastases [facility]) was also pertinent to this visit.   HISTORY OF PRESENT ILLNESS: Shannon [name] is a 55 y.o. female with a history of adenocarcinoma of the right colon for which she underwent a right hemicolectomy in October 2018, she had node positive disease and received 12 cycles of adjuvant FOLFOX which she completed in April 2019.  She was working up symptoms of nausea with GI in [place] but developed progressive nausea and headache as well as ataxia and was seen in the emergency department In [place] which revealed a 3.8 cm enhancing and necrotic mass in the left cerebellum with surrounding edema.  She was transferred after an MRI scan also confirmed this concern, the MRI scan on 05/27/2020 showed a solitary mass in the cerebellum measuring 3.1 x 3.2 cm with central necrosis and extensive signal loss due to penetration of Feraheme which she had bradycardia previously received.  A second mass lesion was not identified but there was mass-effect on the  fourth ventricle with obstructive hydrocephalus of the lateral and third ventricles.  She was transferred to [facility] and subsequently underwent surgical resection with suboccipital craniectomy on 05/29/2020.  Her postoperative imaging with an MRI of the brain on 05/30/2020 showed interval resection of the left cerebellar metastasis with again limited post contrast imaging from her prior Feraheme injection no specific evidence of residual tumor was identified, and persistent cerebellar edema was noted with decreased mass-effect and decreased effacement of the ventricle.  She went on to receive postop fractionated SRS to the surgical cavity in the left cerebellum. She tolerated this well. She continues to follow with Dr. Bobby Rumpf in [place] and she has responded well to FOLFIRI/Avastin which she continues. She had routine surveillance MRI on 10/04/20 that showed a new lesion in the right cerebellum measuring 8 mm. The prior enhancement in the left posterior fossa surgical cavity has resolved , and is now predominently along the inferior margin of the cavity and will be her new baseline. No other disease was noted. She's seen today to consider salvage stereotactic radiosurgery [facility]) to the right cerebellar lesion.    PREVIOUS RADIATION THERAPY: Yes  07/03/2020 through 07/08/2020 Postop SRS: Site Technique Total Dose (Gy) Dose per Fx (Gy) Completed Fx Beam Energies  Brain: Brain Postop PTV1 Lt Cerebellum 44 mm IMRT 27/27 9 3/3 6XFFF     PAST MEDICAL HISTORY:  Past Medical History:  Diagnosis Date  . Anemia    [place]  . Anxiety   . Blood clot in vein   . Bowel obstruction ([place])   . Colon  cancer ([place]) 2018   treated with surgery and chemotherapy  . Depression   . Diabetes mellitus without complication ([place])    Type II  . DVT (deep venous thrombosis) ([place]) 2019   behind right knee- while she wasa on chemo  . GERD (gastroesophageal reflux disease)   . History of blood transfusion   . History of  chemotherapy   . History of kidney stones 2012   passed  . Hypothyroidism   . Obesity        PAST SURGICAL HISTORY: Past Surgical History:  Procedure Laterality Date  . ABDOMINAL HYSTERECTOMY  05/2019   total laparoscopin with tubes and ovariers removed also.  . APPENDECTOMY  05/2019  . COLON SURGERY  03/2017   Colon resection  . COLONOSCOPY  2019  . SUBOCCIPITAL CRANIECTOMY CERVICAL LAMINECTOMY Left 05/29/2020   Procedure: Suboccipital Craniectomy for Resection of Left Cerebellar Mass;  Surgeon: Kary Kos, MD;  Location: [place];  Service: Neurosurgery;  Laterality: Left;     FAMILY HISTORY:  Family History  Problem Relation Age of Onset  . Irritable bowel syndrome Mother   . Colon polyps Father   . Breast cancer Maternal Grandmother   . Diabetes Maternal Grandmother   . Diabetes Maternal Grandfather   . Breast cancer Paternal Grandmother   . Colon polyps Maternal Uncle   . Stomach cancer Neg Hx   . Pancreatic cancer Neg Hx   . Esophageal cancer Neg Hx   . Colon cancer Neg Hx      SOCIAL HISTORY:  reports that she has never smoked. She has never used smokeless tobacco. She reports current alcohol use. She reports that she does not use drugs.  The patient is widowed and resides in [place].  She has an 33 year old daughter and her mother is very actively supporting her.  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   ALLERGIES: Clindamycin/lincomycin and Penicillins   MEDICATIONS:  Current Outpatient Medications  Medication Sig Dispense Refill  . acetaminophen (TYLENOL) 325 MG tablet Take 2 tablets (650 mg total) by mouth every 6 (six) hours as needed for mild pain.    Marland Kitchen atorvastatin (LIPITOR) 10 MG tablet Take 1 tablet (10 mg total) by mouth at bedtime. 30 tablet 0  . CONTOUR NEXT TEST test strip 3 (three) times daily.    . Dulaglutide (TRULICITY) 1.5 NF/6.2ZH SOPN Inject into the skin.    . fluvoxaMINE (LUVOX) 100 MG tablet Take 3  tablets (300 mg total) by mouth at bedtime. 270 tablet 1  . hyoscyamine (LEVSIN SL) 0.125 MG SL tablet Place 1 tablet (0.125 mg total) under the tongue every 6 (six) hours as needed. 45 tablet 1  . insulin aspart (NOVOLOG FLEXPEN) 100 UNIT/ML FlexPen Inject 8 Units into the skin 3 (three) times daily with meals. 15 mL 11  . insulin glargine (LANTUS) 100 UNIT/ML Solostar Pen Inject 26 Units into the skin daily. 15 mL 11  . lamoTRIgine (LAMICTAL) 150 MG tablet Take 1 tablet (150 mg total) by mouth daily. 90 tablet 3  . levothyroxine (SYNTHROID) 75 MCG tablet Take 1 tablet (75 mcg total) by mouth daily. 30 tablet 0  . metFORMIN (GLUCOPHAGE) 500 MG tablet Take 1 tablet (500 mg total) by mouth 2 (two) times daily with a meal. 60 tablet 0  . modafinil (PROVIGIL) 200 MG tablet 1/2 tablet each morning for 1 week, then 1 each AM (Patient not taking: Reported on 10/08/2020) 30 tablet 1  . NOVOFINE PEN  NEEDLE 32G X 6 MM MISC SMARTSIG:Syringe(s) SUB-Q    . ondansetron (ZOFRAN) 4 MG tablet Take 1 tablet (4 mg total) by mouth every 4 (four) hours as needed for nausea. 90 tablet 3  . pantoprazole (PROTONIX) 40 MG tablet TAKE 1 TABLET BY MOUTH EVERYDAY AT BEDTIME 30 tablet 0  . prochlorperazine (COMPAZINE) 10 MG tablet Take 1 tablet (10 mg total) by mouth every 6 (six) hours as needed for nausea or vomiting. 90 tablet 3  . QUEtiapine (SEROQUEL) 400 MG tablet Take 2 tablets (800 mg total) by mouth at bedtime. 30 tablet 0  . senna-docusate (SENOKOT-S) 8.6-50 MG tablet Take 2 tablets by mouth at bedtime.    Marland Kitchen zolpidem (AMBIEN) 5 MG tablet Take 1 tablet (5 mg total) by mouth at bedtime as needed for sleep. 30 tablet 0   No current facility-administered medications for this encounter.     REVIEW OF SYSTEMS: On review of systems, the patient reports that she is doing well overall. She has some residual fatigue from her systemic therapy. She does have some short term memory deficits as well, but is not having dizziness  or changes in gait. No other complaints are noted.  PHYSICAL EXAM:  Wt Readings from Last 3 Encounters:  10/08/20 202 lb (91.6 kg)  10/08/20 204 lb 8 oz (92.8 kg)  10/07/20 191 lb (86.6 kg)   Temp Readings from Last 3 Encounters:  10/08/20 97.9 F (36.6 C) (Oral)  10/08/20 97.8 F (36.6 C) (Tympanic)  10/07/20 98.1 F (36.7 C)   BP Readings from Last 3 Encounters:  10/08/20 122/85  10/08/20 111/78  10/07/20 (!) 108/56   Pulse Readings from Last 3 Encounters:  10/08/20 (!) 108  10/08/20 (!) 119  10/07/20 (!) 105   In general this is a well appearing caucasian female in no acute distress. She's alert and oriented x4 and appropriate throughout the examination. Cardiopulmonary assessment is negative for acute distress and she exhibits normal effort.   ECOG = 1  0 - Asymptomatic (Fully active, able to carry on all predisease activities without restriction)  1 - Symptomatic but completely ambulatory (Restricted in physically strenuous activity but ambulatory and able to carry out work of a light or sedentary nature. For example, light housework, office work)  2 - Symptomatic, <50% in bed during the day (Ambulatory and capable of all self care but unable to carry out any work activities. Up and about more than 50% of waking hours)  3 - Symptomatic, >50% in bed, but not bedbound (Capable of only limited self-care, confined to bed or chair 50% or more of waking hours)  4 - Bedbound (Completely disabled. Cannot carry on any self-care. Totally confined to bed or chair)  5 - Death   Eustace Pen MM, Creech RH, Tormey DC, et al. [phone]). "Toxicity and response criteria of the [facility] Group". [place] Oncol. 5 (6): 649-55    LABORATORY DATA:  Lab Results  Component Value Date   WBC 2.7 09/30/2020   HGB 12.4 09/30/2020   HCT 37 09/30/2020   MCV 98 09/30/2020   PLT 136 (A) 09/30/2020   Lab Results  Component Value Date   NA 137 09/16/2020   K 4.5 09/16/2020    CL 104 09/16/2020   CO2 22 09/16/2020   Lab Results  Component Value Date   ALT 34 09/16/2020   AST 32 09/16/2020   ALKPHOS 128 (A) 08/30/2020   BILITOT 0.3 06/07/2020  RADIOGRAPHY: MR BRAIN W WO CONTRAST  Result Date: 10/05/2020 CLINICAL DATA:  Brain/CNS neoplasm. Assess treatment response. Status post SRS. Craniotomy. EXAM: MRI HEAD WITHOUT AND WITH CONTRAST TECHNIQUE: Multiplanar, multiecho pulse sequences of the brain and surrounding structures were obtained without and with intravenous contrast. CONTRAST:  26mL MULTIHANCE GADOBENATE DIMEGLUMINE 529 MG/ML IV SOLN COMPARISON:  None. FINDINGS: Brain: Previously noted enhancement in the surgical cavity the left posterior fossa is essentially resolved. Enhancement is now predominantly along the inferior margin of the cavity. This will serve as a baseline. A new ring-enhancing lesion is present in the right cerebellum measuring up to 8 mm on image 36 of series 11. No additional lesions are present. No hemorrhage or infarct is present. Supratentorial brain demonstrates mild atrophy and white matter disease. This may be related to treatment. White matter changes demonstrate some progression. Vascular: Flow is present in the major intracranial arteries. Skull and upper cervical spine: The craniocervical junction is normal. Upper cervical spine is within normal limits. Marrow signal is unremarkable. Sinuses/Orbits: The paranasal sinuses and mastoid air cells are clear. The globes and orbits are within normal limits. IMPRESSION: 1. New 8 mm ring-enhancing lesion in the right cerebellum consistent with a new metastasis. 2. Postoperative changes of the left cerebellum. Enhancement predominantly along the inferior margin of the cavity is likely postoperative in nature. Residual or recurrent tumor is less likely. This will serve as a baseline. 3. Slight progression of white matter disease. This is likely related to treatment. Electronically Signed   By:  San Morelle M.D.   On: 10/05/2020 11:36       IMPRESSION/PLAN: 1. Recurrent metastatic stage III colon cancer with brain metastases.  Dr. Lisbeth Renshaw discusses the pathology findings and reviews the nature of progressive brain disease. He reviews her prior MRIs predating her treatment in February 2022 as well as scans up until today's visit. She does have a new lesion in the right side of the cerebellum which is the opposite side of her prior postop SRS treatment. He discusses the utility of salvage SRS to this new lesion in the right cerebellum. We discussed the risks, benefits, short, and long term effects of radiotherapy, as well as the curative intent, and the patient is interested in proceeding. Dr. Lisbeth Renshaw discusses the delivery and logistics of radiotherapy and anticipates a course of 1 fraction of radiotherapy. Written consent is obtained and placed in the chart, a copy was provided to the patient. She will simulate this morning with IV contrast. We are planning treatment for 10/17/20.  2. Claustrophobia.  The patient does not think she needs Ativan today for simulation but has this available if she desires to take on the day of her treatment.  In a visit lasting 45 minutes, greater than 50% of the time was spent face to face discussing the patient's condition, in preparation for the discussion, and coordinating the patient's care.   The above documentation reflects my direct findings during this shared patient visit. Please see the separate note by Dr. Lisbeth Renshaw on this date for the remainder of the patient's plan of care.    Carola Rhine, PAC

## 2020-10-11 ENCOUNTER — Inpatient Hospital Stay: Payer: PRIVATE HEALTH INSURANCE

## 2020-10-11 ENCOUNTER — Encounter: Payer: Self-pay | Admitting: Oncology

## 2020-10-11 ENCOUNTER — Other Ambulatory Visit: Payer: Self-pay | Admitting: Oncology

## 2020-10-11 ENCOUNTER — Telehealth: Payer: Self-pay | Admitting: Oncology

## 2020-10-11 ENCOUNTER — Inpatient Hospital Stay (INDEPENDENT_AMBULATORY_CARE_PROVIDER_SITE_OTHER): Payer: PRIVATE HEALTH INSURANCE | Admitting: Oncology

## 2020-10-11 VITALS — BP 135/91 | HR 114 | Temp 93.2°F | Resp 16 | Ht 68.0 in | Wt 205.1 lb

## 2020-10-11 DIAGNOSIS — Z5112 Encounter for antineoplastic immunotherapy: Secondary | ICD-10-CM | POA: Diagnosis not present

## 2020-10-11 DIAGNOSIS — C182 Malignant neoplasm of ascending colon: Secondary | ICD-10-CM

## 2020-10-11 LAB — BASIC METABOLIC PANEL
BUN: 14 (ref 4–21)
CO2: 19 (ref 13–22)
Chloride: 103 (ref 99–108)
Creatinine: 1 (ref 0.5–1.1)
Glucose: 329
Potassium: 3.7 (ref 3.4–5.3)
Sodium: 136 — AB (ref 137–147)

## 2020-10-11 LAB — CBC AND DIFFERENTIAL
HCT: 38 (ref 36–46)
Hemoglobin: 12.7 (ref 12.0–16.0)
Neutrophils Absolute: 1.8
Platelets: 160 (ref 150–399)
WBC: 4

## 2020-10-11 LAB — HEPATIC FUNCTION PANEL
ALT: 31 (ref 7–35)
AST: 27 (ref 13–35)
Alkaline Phosphatase: 166 — AB (ref 25–125)
Bilirubin, Total: 0.4

## 2020-10-11 LAB — COMPREHENSIVE METABOLIC PANEL
Albumin: 4.1 (ref 3.5–5.0)
Calcium: 8.5 — AB (ref 8.7–10.7)

## 2020-10-11 LAB — TOTAL PROTEIN, URINE DIPSTICK: Protein, ur: NEGATIVE mg/dL

## 2020-10-11 LAB — CBC: RBC: 3.84 — AB (ref 3.87–5.11)

## 2020-10-11 NOTE — Telephone Encounter (Signed)
Per 5/13 LOS - Already scheduled

## 2020-10-14 ENCOUNTER — Inpatient Hospital Stay: Payer: PRIVATE HEALTH INSURANCE

## 2020-10-14 ENCOUNTER — Other Ambulatory Visit: Payer: Self-pay

## 2020-10-14 VITALS — BP 137/77 | HR 113 | Temp 98.0°F | Resp 18 | Ht 68.0 in | Wt 201.5 lb

## 2020-10-14 DIAGNOSIS — Z5112 Encounter for antineoplastic immunotherapy: Secondary | ICD-10-CM | POA: Diagnosis not present

## 2020-10-14 DIAGNOSIS — C182 Malignant neoplasm of ascending colon: Secondary | ICD-10-CM

## 2020-10-14 MED ORDER — SODIUM CHLORIDE 0.9 % IV SOLN
Freq: Once | INTRAVENOUS | Status: AC
  Filled 2020-10-14: qty 250

## 2020-10-14 MED ORDER — PALONOSETRON HCL INJECTION 0.25 MG/5ML
INTRAVENOUS | Status: AC
  Filled 2020-10-14: qty 5

## 2020-10-14 MED ORDER — ATROPINE SULFATE 1 MG/ML IJ SOLN
0.5000 mg | Freq: Once | INTRAMUSCULAR | Status: AC | PRN
  Administered 2020-10-14: 0.5 mg via INTRAVENOUS

## 2020-10-14 MED ORDER — LORAZEPAM 2 MG/ML IJ SOLN
0.5000 mg | Freq: Once | INTRAMUSCULAR | Status: AC
  Administered 2020-10-14: 0.5 mg via INTRAVENOUS

## 2020-10-14 MED ORDER — LORAZEPAM 2 MG/ML IJ SOLN
INTRAMUSCULAR | Status: AC
  Filled 2020-10-14: qty 1

## 2020-10-14 MED ORDER — LEUCOVORIN CALCIUM INJECTION 350 MG
400.0000 mg/m2 | Freq: Once | INTRAVENOUS | Status: AC
  Administered 2020-10-14: 836 mg via INTRAVENOUS
  Filled 2020-10-14: qty 41.8

## 2020-10-14 MED ORDER — ATROPINE SULFATE 1 MG/ML IJ SOLN
INTRAMUSCULAR | Status: AC
  Filled 2020-10-14: qty 1

## 2020-10-14 MED ORDER — IRINOTECAN HCL CHEMO INJECTION 100 MG/5ML
180.0000 mg/m2 | Freq: Once | INTRAVENOUS | Status: AC
  Administered 2020-10-14: 380 mg via INTRAVENOUS
  Filled 2020-10-14: qty 15

## 2020-10-14 MED ORDER — FLUOROURACIL CHEMO INJECTION 2.5 GM/50ML
400.0000 mg/m2 | Freq: Once | INTRAVENOUS | Status: AC
  Administered 2020-10-14: 850 mg via INTRAVENOUS
  Filled 2020-10-14: qty 17

## 2020-10-14 MED ORDER — SODIUM CHLORIDE 0.9 % IV SOLN
2400.0000 mg/m2 | INTRAVENOUS | Status: DC
  Administered 2020-10-14: 5000 mg via INTRAVENOUS
  Filled 2020-10-14: qty 100

## 2020-10-14 MED ORDER — SODIUM CHLORIDE 0.9 % IV SOLN
10.0000 mg | Freq: Once | INTRAVENOUS | Status: AC
  Administered 2020-10-14: 10 mg via INTRAVENOUS
  Filled 2020-10-14: qty 1

## 2020-10-14 MED ORDER — SODIUM CHLORIDE 0.9 % IV SOLN
5.0000 mg/kg | Freq: Once | INTRAVENOUS | Status: AC
  Administered 2020-10-14: 500 mg via INTRAVENOUS
  Filled 2020-10-14: qty 16

## 2020-10-14 MED ORDER — PALONOSETRON HCL INJECTION 0.25 MG/5ML
0.2500 mg | Freq: Once | INTRAVENOUS | Status: AC
  Administered 2020-10-14: 0.25 mg via INTRAVENOUS

## 2020-10-14 NOTE — Patient Instructions (Signed)
Leucovorin injection What is this medicine? LEUCOVORIN (loo koe VOR in) is used to prevent or treat the harmful effects of some medicines. This medicine is used to treat anemia caused by a low amount of folic acid in the body. It is also used with 5-fluorouracil (5-FU) to treat colon cancer. This medicine may be used for other purposes; ask your health care provider or pharmacist if you have questions. What should I tell my health care provider before I take this medicine? They need to know if you have any of these conditions:  anemia from low levels of vitamin B-12 in the blood  an unusual or allergic reaction to leucovorin, folic acid, other medicines, foods, dyes, or preservatives  pregnant or trying to get pregnant  breast-feeding How should I use this medicine? This medicine is for injection into a muscle or into a vein. It is given by a health care professional in a hospital or clinic setting. Talk to your pediatrician regarding the use of this medicine in children. Special care may be needed. Overdosage: If you think you have taken too much of this medicine contact a poison control center or emergency room at once. NOTE: This medicine is only for you. Do not share this medicine with others. What if I miss a dose? This does not apply. What may interact with this medicine?  capecitabine  fluorouracil  phenobarbital  phenytoin  primidone  trimethoprim-sulfamethoxazole This list may not describe all possible interactions. Give your health care provider a list of all the medicines, herbs, non-prescription drugs, or dietary supplements you use. Also tell them if you smoke, drink alcohol, or use illegal drugs. Some items may interact with your medicine. What should I watch for while using this medicine? Your condition will be monitored carefully while you are receiving this medicine. This medicine may increase the side effects of 5-fluorouracil, 5-FU. Tell your doctor or health  care professional if you have diarrhea or mouth sores that do not get better or that get worse. What side effects may I notice from receiving this medicine? Side effects that you should report to your doctor or health care professional as soon as possible:  allergic reactions like skin rash, itching or hives, swelling of the face, lips, or tongue  breathing problems  fever, infection  mouth sores  unusual bleeding or bruising  unusually weak or tired Side effects that usually do not require medical attention (report to your doctor or health care professional if they continue or are bothersome):  constipation or diarrhea  loss of appetite  nausea, vomiting This list may not describe all possible side effects. Call your doctor for medical advice about side effects. You may report side effects to FDA at 1-800-FDA-1088. Where should I keep my medicine? This drug is given in a hospital or clinic and will not be stored at home. NOTE: This sheet is a summary. It may not cover all possible information. If you have questions about this medicine, talk to your doctor, pharmacist, or health care provider.  2021 Elsevier/Gold Standard (2007-11-22 16:50:29) Fluorouracil, 5-FU injection What is this medicine? FLUOROURACIL, 5-FU (flure oh YOOR a sil) is a chemotherapy drug. It slows the growth of cancer cells. This medicine is used to treat many types of cancer like breast cancer, colon or rectal cancer, pancreatic cancer, and stomach cancer. This medicine may be used for other purposes; ask your health care provider or pharmacist if you have questions. COMMON BRAND NAME(S): Adrucil What should I tell my  health care provider before I take this medicine? They need to know if you have any of these conditions:  blood disorders  dihydropyrimidine dehydrogenase (DPD) deficiency  infection (especially a virus infection such as chickenpox, cold sores, or herpes)  kidney disease  liver  disease  malnourished, poor nutrition  recent or ongoing radiation therapy  an unusual or allergic reaction to fluorouracil, other chemotherapy, other medicines, foods, dyes, or preservatives  pregnant or trying to get pregnant  breast-feeding How should I use this medicine? This drug is given as an infusion or injection into a vein. It is administered in a hospital or clinic by a specially trained health care professional. Talk to your pediatrician regarding the use of this medicine in children. Special care may be needed. Overdosage: If you think you have taken too much of this medicine contact a poison control center or emergency room at once. NOTE: This medicine is only for you. Do not share this medicine with others. What if I miss a dose? It is important not to miss your dose. Call your doctor or health care professional if you are unable to keep an appointment. What may interact with this medicine? Do not take this medicine with any of the following medications:  live virus vaccines This medicine may also interact with the following medications:  medicines that treat or prevent blood clots like warfarin, enoxaparin, and dalteparin This list may not describe all possible interactions. Give your health care provider a list of all the medicines, herbs, non-prescription drugs, or dietary supplements you use. Also tell them if you smoke, drink alcohol, or use illegal drugs. Some items may interact with your medicine. What should I watch for while using this medicine? Visit your doctor for checks on your progress. This drug may make you feel generally unwell. This is not uncommon, as chemotherapy can affect healthy cells as well as cancer cells. Report any side effects. Continue your course of treatment even though you feel ill unless your doctor tells you to stop. In some cases, you may be given additional medicines to help with side effects. Follow all directions for their use. Call  your doctor or health care professional for advice if you get a fever, chills or sore throat, or other symptoms of a cold or flu. Do not treat yourself. This drug decreases your body's ability to fight infections. Try to avoid being around people who are sick. This medicine may increase your risk to bruise or bleed. Call your doctor or health care professional if you notice any unusual bleeding. Be careful brushing and flossing your teeth or using a toothpick because you may get an infection or bleed more easily. If you have any dental work done, tell your dentist you are receiving this medicine. Avoid taking products that contain aspirin, acetaminophen, ibuprofen, naproxen, or ketoprofen unless instructed by your doctor. These medicines may hide a fever. Do not become pregnant while taking this medicine. Women should inform their doctor if they wish to become pregnant or think they might be pregnant. There is a potential for serious side effects to an unborn child. Talk to your health care professional or pharmacist for more information. Do not breast-feed an infant while taking this medicine. Men should inform their doctor if they wish to father a child. This medicine may lower sperm counts. Do not treat diarrhea with over the counter products. Contact your doctor if you have diarrhea that lasts more than 2 days or if it is severe and  watery. This medicine can make you more sensitive to the sun. Keep out of the sun. If you cannot avoid being in the sun, wear protective clothing and use sunscreen. Do not use sun lamps or tanning beds/booths. What side effects may I notice from receiving this medicine? Side effects that you should report to your doctor or health care professional as soon as possible:  allergic reactions like skin rash, itching or hives, swelling of the face, lips, or tongue  low blood counts - this medicine may decrease the number of white blood cells, red blood cells and platelets. You  may be at increased risk for infections and bleeding.  signs of infection - fever or chills, cough, sore throat, pain or difficulty passing urine  signs of decreased platelets or bleeding - bruising, pinpoint red spots on the skin, black, tarry stools, blood in the urine  signs of decreased red blood cells - unusually weak or tired, fainting spells, lightheadedness  breathing problems  changes in vision  chest pain  mouth sores  nausea and vomiting  pain, swelling, redness at site where injected  pain, tingling, numbness in the hands or feet  redness, swelling, or sores on hands or feet  stomach pain  unusual bleeding Side effects that usually do not require medical attention (report to your doctor or health care professional if they continue or are bothersome):  changes in finger or toe nails  diarrhea  dry or itchy skin  hair loss  headache  loss of appetite  sensitivity of eyes to the light  stomach upset  unusually teary eyes This list may not describe all possible side effects. Call your doctor for medical advice about side effects. You may report side effects to FDA at 1-800-FDA-1088. Where should I keep my medicine? This drug is given in a hospital or clinic and will not be stored at home. NOTE: This sheet is a summary. It may not cover all possible information. If you have questions about this medicine, talk to your doctor, pharmacist, or health care provider.  2021 Elsevier/Gold Standard (2019-04-18 15:00:03) Irinotecan injection What is this medicine? IRINOTECAN (ir in oh TEE kan ) is a chemotherapy drug. It is used to treat colon and rectal cancer. This medicine may be used for other purposes; ask your health care provider or pharmacist if you have questions. COMMON BRAND NAME(S): Camptosar What should I tell my health care provider before I take this medicine? They need to know if you have any of these  conditions:  dehydration  diarrhea  infection (especially a virus infection such as chickenpox, cold sores, or herpes)  liver disease  low blood counts, like low white cell, platelet, or red cell counts  low levels of calcium, magnesium, or potassium in the blood  recent or ongoing radiation therapy  an unusual or allergic reaction to irinotecan, other medicines, foods, dyes, or preservatives  pregnant or trying to get pregnant  breast-feeding How should I use this medicine? This drug is given as an infusion into a vein. It is administered in a hospital or clinic by a specially trained health care professional. Talk to your pediatrician regarding the use of this medicine in children. Special care may be needed. Overdosage: If you think you have taken too much of this medicine contact a poison control center or emergency room at once. NOTE: This medicine is only for you. Do not share this medicine with others. What if I miss a dose? It is important not to  miss your dose. Call your doctor or health care professional if you are unable to keep an appointment. What may interact with this medicine? Do not take this medicine with any of the following medications:  cobicistat  itraconazole This medicine may interact with the following medications:  antiviral medicines for HIV or AIDS  certain antibiotics like rifampin or rifabutin  certain medicines for fungal infections like ketoconazole, posaconazole, and voriconazole  certain medicines for seizures like carbamazepine, phenobarbital, phenotoin  clarithromycin  gemfibrozil  nefazodone  St. John's Wort This list may not describe all possible interactions. Give your health care provider a list of all the medicines, herbs, non-prescription drugs, or dietary supplements you use. Also tell them if you smoke, drink alcohol, or use illegal drugs. Some items may interact with your medicine. What should I watch for while using  this medicine? Your condition will be monitored carefully while you are receiving this medicine. You will need important blood work done while you are taking this medicine. This drug may make you feel generally unwell. This is not uncommon, as chemotherapy can affect healthy cells as well as cancer cells. Report any side effects. Continue your course of treatment even though you feel ill unless your doctor tells you to stop. In some cases, you may be given additional medicines to help with side effects. Follow all directions for their use. You may get drowsy or dizzy. Do not drive, use machinery, or do anything that needs mental alertness until you know how this medicine affects you. Do not stand or sit up quickly, especially if you are an older patient. This reduces the risk of dizzy or fainting spells. Call your health care professional for advice if you get a fever, chills, or sore throat, or other symptoms of a cold or flu. Do not treat yourself. This medicine decreases your body's ability to fight infections. Try to avoid being around people who are sick. Avoid taking products that contain aspirin, acetaminophen, ibuprofen, naproxen, or ketoprofen unless instructed by your doctor. These medicines may hide a fever. This medicine may increase your risk to bruise or bleed. Call your doctor or health care professional if you notice any unusual bleeding. Be careful brushing and flossing your teeth or using a toothpick because you may get an infection or bleed more easily. If you have any dental work done, tell your dentist you are receiving this medicine. Do not become pregnant while taking this medicine or for 6 months after stopping it. Women should inform their health care professional if they wish to become pregnant or think they might be pregnant. Men should not father a child while taking this medicine and for 3 months after stopping it. There is potential for serious side effects to an unborn child.  Talk to your health care professional for more information. Do not breast-feed an infant while taking this medicine or for 7 days after stopping it. This medicine has caused ovarian failure in some women. This medicine may make it more difficult to get pregnant. Talk to your health care professional if you are concerned about your fertility. This medicine has caused decreased sperm counts in some men. This may make it more difficult to father a child. Talk to your health care professional if you are concerned about your fertility. What side effects may I notice from receiving this medicine? Side effects that you should report to your doctor or health care professional as soon as possible:  allergic reactions like skin rash, itching or  hives, swelling of the face, lips, or tongue  chest pain  diarrhea  flushing, runny nose, sweating during infusion  low blood counts - this medicine may decrease the number of white blood cells, red blood cells and platelets. You may be at increased risk for infections and bleeding.  nausea, vomiting  pain, swelling, warmth in the leg  signs of decreased platelets or bleeding - bruising, pinpoint red spots on the skin, black, tarry stools, blood in the urine  signs of infection - fever or chills, cough, sore throat, pain or difficulty passing urine  signs of decreased red blood cells - unusually weak or tired, fainting spells, lightheadedness Side effects that usually do not require medical attention (report to your doctor or health care professional if they continue or are bothersome):  constipation  hair loss  headache  loss of appetite  mouth sores  stomach pain This list may not describe all possible side effects. Call your doctor for medical advice about side effects. You may report side effects to FDA at 1-800-FDA-1088. Where should I keep my medicine? This drug is given in a hospital or clinic and will not be stored at home. NOTE: This  sheet is a summary. It may not cover all possible information. If you have questions about this medicine, talk to your doctor, pharmacist, or health care provider.  2021 Elsevier/Gold Standard (2019-04-18 17:46:13)

## 2020-10-14 NOTE — Telephone Encounter (Signed)
Rx refill request

## 2020-10-15 ENCOUNTER — Inpatient Hospital Stay: Payer: PRIVATE HEALTH INSURANCE

## 2020-10-15 ENCOUNTER — Encounter: Payer: Self-pay | Admitting: *Deleted

## 2020-10-15 NOTE — Progress Notes (Signed)
[facility] Psychosocial Distress Screening Clinical Social Work  Clinical Social Work was referred by distress screening protocol.  The patient scored a 8 on the Psychosocial Distress Thermometer which indicates severe distress. Clinical Social Worker contacted patient by phone to assess for distress and other psychosocial needs. CSW unable to reach patient, left voicemail to return call.  [facility] DISTRESS SCREENING 10/10/2020  Screening Type Initial Screening  Distress experienced in past week (1-10) 8  Practical problem type Work/school    Clinical Social Worker follow up needed: Yes.    If yes, follow up plan:Awaiting patient call.   Gwinda Maine, LCSW

## 2020-10-16 ENCOUNTER — Inpatient Hospital Stay: Payer: PRIVATE HEALTH INSURANCE

## 2020-10-16 ENCOUNTER — Ambulatory Visit: Payer: PRIVATE HEALTH INSURANCE | Admitting: Radiation Oncology

## 2020-10-16 ENCOUNTER — Other Ambulatory Visit: Payer: Self-pay

## 2020-10-16 VITALS — BP 118/83 | HR 105 | Temp 98.1°F | Resp 18 | Ht 68.0 in | Wt 202.0 lb

## 2020-10-16 DIAGNOSIS — C182 Malignant neoplasm of ascending colon: Secondary | ICD-10-CM

## 2020-10-16 DIAGNOSIS — Z5112 Encounter for antineoplastic immunotherapy: Secondary | ICD-10-CM | POA: Diagnosis not present

## 2020-10-16 DIAGNOSIS — C7931 Secondary malignant neoplasm of brain: Secondary | ICD-10-CM | POA: Diagnosis not present

## 2020-10-16 MED ORDER — HEPARIN SOD (PORK) LOCK FLUSH 100 UNIT/ML IV SOLN
500.0000 [IU] | Freq: Once | INTRAVENOUS | Status: AC | PRN
  Administered 2020-10-16: 500 [IU]
  Filled 2020-10-16: qty 5

## 2020-10-16 MED ORDER — SODIUM CHLORIDE 0.9% FLUSH
10.0000 mL | INTRAVENOUS | Status: DC | PRN
  Administered 2020-10-16: 10 mL
  Filled 2020-10-16: qty 10

## 2020-10-16 NOTE — Patient Instructions (Signed)
[facility]  Discharge Instructions: Thank you for choosing [facility] to provide your oncology and hematology care.  If you have a lab appointment with the Cancer Center, please go directly to the Cancer Center and check in at the registration area.   Wear comfortable clothing and clothing appropriate for easy access to any Portacath or PICC line.   We strive to give you quality time with your provider. You may need to reschedule your appointment if you arrive late (15 or more minutes).  Arriving late affects you and other patients whose appointments are after yours.  Also, if you miss three or more appointments without notifying the office, you may be dismissed from the clinic at the provider's discretion.      For prescription refill requests, have your pharmacy contact our office and allow 72 hours for refills to be completed.    Today you received the following chemotherapy and/or immunotherapy agents 5Flourouracil     To help prevent nausea and vomiting after your treatment, we encourage you to take your nausea medication as directed.  BELOW ARE SYMPTOMS THAT SHOULD BE REPORTED IMMEDIATELY: . *FEVER GREATER THAN 100.4 F (38 C) OR HIGHER . *CHILLS OR SWEATING . *NAUSEA AND VOMITING THAT IS NOT CONTROLLED WITH YOUR NAUSEA MEDICATION . *UNUSUAL SHORTNESS OF BREATH . *UNUSUAL BRUISING OR BLEEDING . *URINARY PROBLEMS (pain or burning when urinating, or frequent urination) . *BOWEL PROBLEMS (unusual diarrhea, constipation, pain near the anus) . TENDERNESS IN MOUTH AND THROAT WITH OR WITHOUT PRESENCE OF ULCERS (sore throat, sores in mouth, or a toothache) . UNUSUAL RASH, SWELLING OR PAIN  . UNUSUAL VAGINAL DISCHARGE OR ITCHING   Items with * indicate a potential emergency and should be followed up as soon as possible or go to the Emergency Department if any problems should occur.  Please show the CHEMOTHERAPY ALERT CARD or IMMUNOTHERAPY ALERT CARD  at check-in to the Emergency Department and triage nurse.  Should you have questions after your visit or need to cancel or reschedule your appointment, please contact  [facility]  Dept: [phone]  and follow the prompts.  Office hours are 8:00 a.m. to 4:30 p.m. Monday - Friday. Please note that voicemails left after 4:00 p.m. may not be returned until the following business day.  We are closed weekends and major holidays. You have access to a nurse at all times for urgent questions. Please call the main number to the clinic Dept: [phone] and follow the prompts.  For any non-urgent questions, you may also contact your provider using MyChart. We now offer e-Visits for anyone 18 and older to request care online for non-urgent symptoms. For details visit [email or web address].   Also download the MyChart app! Go to the app store, search "MyChart", open the app, select , and log in with your MyChart username and password.  Due to Covid, a mask is required upon entering the hospital/clinic. If you do not have a mask, one will be given to you upon arrival. For doctor visits, patients may have 1 support person aged 18 or older with them. For treatment visits, patients cannot have anyone with them due to current Covid guidelines and our immunocompromised population.    

## 2020-10-17 ENCOUNTER — Inpatient Hospital Stay: Payer: PRIVATE HEALTH INSURANCE

## 2020-10-17 ENCOUNTER — Ambulatory Visit: Payer: PRIVATE HEALTH INSURANCE | Admitting: Radiation Oncology

## 2020-10-21 ENCOUNTER — Encounter: Payer: Self-pay | Admitting: Oncology

## 2020-10-21 ENCOUNTER — Other Ambulatory Visit: Payer: Self-pay | Admitting: Psychiatry

## 2020-10-21 ENCOUNTER — Telehealth: Payer: Self-pay | Admitting: Psychiatry

## 2020-10-21 ENCOUNTER — Other Ambulatory Visit: Payer: Self-pay | Admitting: Pharmacist

## 2020-10-21 DIAGNOSIS — G3184 Mild cognitive impairment, so stated: Secondary | ICD-10-CM

## 2020-10-21 DIAGNOSIS — F333 Major depressive disorder, recurrent, severe with psychotic symptoms: Secondary | ICD-10-CM

## 2020-10-21 DIAGNOSIS — R530 Neoplastic (malignant) related fatigue: Secondary | ICD-10-CM

## 2020-10-21 MED ORDER — MODAFINIL 200 MG PO TABS: ORAL_TABLET | ORAL | 1 refills | Status: DC

## 2020-10-21 NOTE — Telephone Encounter (Signed)
Patient stated Mod[facility]il too expensive at the [facility] and they can't transfer the Rx. Patient is requesting it to be filled at the [facility] in [place]. Direct questions # [phone].

## 2020-10-21 NOTE — Telephone Encounter (Signed)
done

## 2020-10-22 ENCOUNTER — Ambulatory Visit
Admission: RE | Admit: 2020-10-22 | Discharge: 2020-10-22 | Disposition: A | Discharge status: Home / Self Care | Payer: PRIVATE HEALTH INSURANCE | Source: Ambulatory Visit | Attending: Radiation Oncology | Admitting: Radiation Oncology

## 2020-10-22 ENCOUNTER — Encounter: Payer: Self-pay | Admitting: Radiation Oncology

## 2020-10-22 ENCOUNTER — Other Ambulatory Visit: Payer: Self-pay

## 2020-10-22 DIAGNOSIS — C7931 Secondary malignant neoplasm of brain: Secondary | ICD-10-CM | POA: Diagnosis not present

## 2020-10-22 NOTE — Procedures (Signed)
  Name: Shannon [name]  MRN: [id number]  Date: 10/22/2020   DOB: 11-22-1965  Stereotactic Radiosurgery Operative Note  PRE-OPERATIVE DIAGNOSIS:  Solitary Brain Metastasis  POST-OPERATIVE DIAGNOSIS:  Solitary Brain Metastasis  PROCEDURE:  Stereotactic Radiosurgery  SURGEON:  Elaina Hoops, MD  NARRATIVE: The patient underwent a radiation treatment planning session in the radiation oncology simulation suite under the care of the radiation oncology physician and physicist.  I participated closely in the radiation treatment planning afterwards. The patient underwent planning CT which was fused to 3T high resolution MRI with 1 mm axial slices.  These images were fused on the planning system.  We contoured the gross target volumes and subsequently expanded this to yield the Planning Target Volume. I actively participated in the planning process.  I helped to define and review the target contours and also the contours of the optic pathway, eyes, brainstem and selected nearby organs at risk.  All the dose constraints for critical structures were reviewed and compared to AAPM Task Group 101.  The prescription dose conformity was reviewed.  I approved the plan electronically.    Accordingly, [name] was brought to the TrueBeam stereotactic radiation treatment linac and placed in the custom immobilization mask.  The patient was aligned according to the IR fiducial markers with BrainLab Exactrac, then orthogonal x-rays were used in ExacTrac with the 6DOF robotic table and the shifts were made to align the patient  [name] received stereotactic radiosurgery uneventfully.    The detailed description of the procedure is recorded in the radiation oncology procedure note.  I was present for the duration of the procedure.  DISPOSITION:  Following delivery, the patient was transported to nursing in stable condition and monitored for possible acute effects to be discharged to  home in stable condition with follow-up in one month.  Elaina Hoops, MD 10/22/2020 3:38 PM

## 2020-10-22 NOTE — Progress Notes (Signed)
Shannon [name] rested with [name] for 30 minutes following her [name] treatment.  Patient denies headache, dizziness, nausea, diplopia or ringing in the ears. Denies fatigue. Patient without complaints. Understands to avoid strenuous activity for the next 24 hours and call [phone] with needs.  Patient and mother walked out of clinic without difficulty.  BP 116/86   Pulse (!) 102   Temp (!) 96.8 F (36 C)   Resp 18   LMP 02/22/2019 (Approximate)   SpO2 99%    [name] M. [facility], BSN

## 2020-10-22 NOTE — Progress Notes (Signed)
[facility]  [address] [place],  [place]  [address] [phone]  Clinic Day:  10/25/2020  Referring physician: Greig Right, MD  This document serves as a record of services personally performed by Dequincy Macarthur Critchley, MD. It was created on their behalf by [facility] E, a trained medical scribe. The creation of this record is based on the scribe's personal observations and the provider's statements to them.  HISTORY OF PRESENT ILLNESS:  The patient is a 55 y.o. female with metastatic colon cancer, which includes spread of disease to her abdominal cavity, lungs, brain and right adrenal gland. She comes in today to be evaluated before heading into her 7th cycle of FOLFIRI/Avastin.  The patient claims to have toleraed her 6th cycle of treatment fairly well.  She has had slightly more diarrhea, which she manages with Imodium.  She also comes in for IV fluids intermittently to protect against dehydration.  She also complains of mouth soreness/sensitivity, but denies having having any obvious mouth sores.  However, she denies having any new GI or other systemic symptoms which concern her for disease progression.  Of note, the patient underwent stereotactic brain radiation earlier this week to treat a new 8 mm brain lesion seen in her right cerebellum.  With respect to her colon cancer history, she is status post a right hemicolectomy in early October 2018, followed by 12 cycles of adjuvant chemotherapy, which were completed in April 2019.  In December 2021, she underwent a left cerebellar metastatectomy, whose pathology was consistent with metastatic colon cancer.  Tumor testing did come back MMR normal.  CT scans recently showed evidence of her cancer being in multiple locations, which has her on palliative chemotherapy at this time.    PHYSICAL EXAM:  Blood pressure 118/79, pulse (!) 107, temperature 98.9 F (37.2 C), resp. rate 16, height 5' 8"  (1.727 m), weight  201 lb 1.6 oz (91.2 kg), last menstrual period 02/22/2019, SpO2 95 %. Wt Readings from Last 3 Encounters:  10/25/20 201 lb 1.6 oz (91.2 kg)  10/24/20 202 lb (91.6 kg)  10/16/20 202 lb (91.6 kg)   Body mass index is 30.58 kg/m. Performance status (ECOG): 1 Physical Exam Constitutional:      Appearance: Normal appearance. She is not ill-appearing.  HENT:     Mouth/Throat:     Mouth: Mucous membranes are moist.     Pharynx: Oropharynx is clear. No oropharyngeal exudate or posterior oropharyngeal erythema.  Cardiovascular:     Rate and Rhythm: Normal rate and regular rhythm.     Heart sounds: No murmur heard. No friction rub. No gallop.   Pulmonary:     Effort: Pulmonary effort is normal. No respiratory distress.     Breath sounds: Normal breath sounds. No wheezing, rhonchi or rales.  Chest:  Breasts:     Right: No axillary adenopathy or supraclavicular adenopathy.     Left: No axillary adenopathy or supraclavicular adenopathy.    Abdominal:     General: Bowel sounds are normal. There is no distension.     Palpations: Abdomen is soft. There is no mass.     Tenderness: There is no abdominal tenderness.  Musculoskeletal:        General: No swelling.     Right lower leg: No edema.     Left lower leg: No edema.  Lymphadenopathy:     Cervical: No cervical adenopathy.     Upper Body:     Right upper body: No supraclavicular  or axillary adenopathy.     Left upper body: No supraclavicular or axillary adenopathy.     Lower Body: No right inguinal adenopathy. No left inguinal adenopathy.  Skin:    General: Skin is warm.     Coloration: Skin is not jaundiced.     Findings: No lesion or rash.  Neurological:     General: No focal deficit present.     Mental Status: She is alert and oriented to person, place, and time. Mental status is at baseline.     Cranial Nerves: Cranial nerves are intact.  Psychiatric:        Mood and Affect: Mood normal.        Behavior: Behavior normal.         Thought Content: Thought content normal.     LABS:      ASSESSMENT & PLAN:  Assessment/Plan:  A 55 y.o. female with metastatic colon cancer.  She will proceed with her 7th cycle of FOLFIRI/Avastin next week.  Clinically, she appears to be doing okay, despite having a new brain metastasis.  As mentioned previously, it was treated with stereotactic radiation earlier this week.  I will see her back in 2 weeks before she heads into her 8th cycle of FOLFIRI/Avastin.  The patient understands all the plans discussed today and is in agreement with them.     I, Rita Ohara, am acting as scribe for Marice Potter, MD    I have reviewed this report as typed by the medical scribe, and it is complete and accurate.  Dequincy Macarthur Critchley, MD

## 2020-10-23 ENCOUNTER — Other Ambulatory Visit: Payer: Self-pay | Admitting: Radiation Therapy

## 2020-10-24 ENCOUNTER — Encounter: Payer: Self-pay | Admitting: Physical Medicine & Rehabilitation

## 2020-10-24 ENCOUNTER — Other Ambulatory Visit: Payer: Self-pay

## 2020-10-24 ENCOUNTER — Encounter
Payer: PRIVATE HEALTH INSURANCE | Attending: Physical Medicine & Rehabilitation | Admitting: Physical Medicine & Rehabilitation

## 2020-10-24 VITALS — BP 104/73 | HR 100 | Temp 98.0°F | Ht 68.0 in | Wt 202.0 lb

## 2020-10-24 DIAGNOSIS — G9389 Other specified disorders of brain: Secondary | ICD-10-CM | POA: Diagnosis present

## 2020-10-24 DIAGNOSIS — I951 Orthostatic hypotension: Secondary | ICD-10-CM | POA: Insufficient documentation

## 2020-10-24 DIAGNOSIS — F331 Major depressive disorder, recurrent, moderate: Secondary | ICD-10-CM | POA: Diagnosis present

## 2020-10-24 DIAGNOSIS — G441 Vascular headache, not elsewhere classified: Secondary | ICD-10-CM | POA: Insufficient documentation

## 2020-10-24 DIAGNOSIS — C7931 Secondary malignant neoplasm of brain: Secondary | ICD-10-CM | POA: Diagnosis present

## 2020-10-24 NOTE — Progress Notes (Signed)
Subjective:    Patient ID: Shannon [name], female    DOB: Jul 19, 1965, 55 y.o.   MRN: [id number]  HPI Female with history of anxiety depression as well as OCD maintained on Luvox, diabetes mellitus obesity with BMI 31.32 colon cancer 2018 with resection followed by chemotherapy presents for hospital follow up after receiving CIR for left cerebellar metastatic adenocarcinoma of colon s/p suboccipital craniectomy on 05/29/2020.    Last clinic visit on 08/26/20.  Since that time, pt states she stopped therapies due to new diagnosis and radiation. Headaches are controlled and she was able to wean off of Topamax. Orthostatic symptoms are worse.   Pain Inventory Average Pain 2 Pain Right Now 2 My pain is intermittent, sharp and stabbing  LOCATION OF PAIN  Head  BOWEL Number of stools per week: 7-10 Oral laxative use No  Type of laxative pills Enema or suppository use No  History of colostomy No  Incontinent No   BLADDER Normal In and out cath, frequency N/a Able to self cath No  Bladder incontinence No  Frequent urination Yes, with cancer treatment Leakage with coughing Yes  Difficulty starting stream Yes  Incomplete bladder emptying Yes    Mobility walk without assistance ability to climb steps?  yes do you drive?  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  yes  Neuro/Psych bladder control problems weakness dizziness confusion depression anxiety  Prior Studies Any changes since last visit?  no  Physicians involved in your care Any changes since last visit?  no   Family History  Problem Relation Age of Onset  . Irritable bowel syndrome Mother   . Colon polyps Father   . Breast cancer Maternal Grandmother   . Diabetes Maternal Grandmother   . Diabetes Maternal Grandfather   . Breast cancer Paternal  Grandmother   . Colon polyps Maternal Uncle   . Stomach cancer Neg Hx   . Pancreatic cancer Neg Hx   . Esophageal cancer Neg Hx   . Colon cancer Neg Hx    Social History   Socioeconomic History  . Marital status: Widowed    Spouse name: Not on file  . Number of children: Not on file  . Years of education: Not on file  . Highest education level: Not on file  Occupational History  . Occupation: care coordinator   Tobacco Use  . Smoking status: Never Smoker  . Smokeless tobacco: Never Used  Vaping Use  . Vaping Use: Never used  Substance and Sexual Activity  . Alcohol use: Yes    Comment: rare  . Drug use: Never  . Sexual activity: Not Currently  Other Topics Concern  . Not on file  Social History Narrative  . Not on file   Social Determinants of Health   Financial Resource Strain: Not on file  Food Insecurity: Not on file  Transportation Needs: Not on file  Physical Activity: Not on file  Stress: Not on file  Social Connections: Not on file   Past Surgical History:  Procedure Laterality Date  . ABDOMINAL HYSTERECTOMY  05/2019   total laparoscopin with tubes and ovariers removed also.  . APPENDECTOMY  05/2019  . COLON SURGERY  03/2017   Colon resection  . COLONOSCOPY  2019  . SUBOCCIPITAL CRANIECTOMY CERVICAL LAMINECTOMY Left 05/29/2020   Procedure: Suboccipital  Craniectomy for Resection of Left Cerebellar Mass;  Surgeon: Kary Kos, MD;  Location: [facility];  Service: Neurosurgery;  Laterality: Left;   Past Medical History:  Diagnosis Date  . Anemia    [place]  . Anxiety   . Blood clot in vein   . Bowel obstruction ([place])   . Colon cancer ([facility]) 2018   treated with surgery and chemotherapy  . Depression   . Diabetes mellitus without complication ([place])    Type II  . DVT (deep venous thrombosis) ([place]) 2019   behind right knee- while she wasa on chemo  . GERD (gastroesophageal reflux disease)   . History of blood transfusion   . History of chemotherapy   .  History of kidney stones 2012   passed  . Hypothyroidism   . Obesity    BP 104/73   Pulse 100   Temp 98 F (36.7 C)   Ht 5\' 8"  (1.727 m)   Wt 202 lb (91.6 kg)   LMP 02/22/2019 (Approximate)   SpO2 94%   BMI 30.71 kg/m   Opioid Risk Score:   Fall Risk Score:  `1  Depression screen PHQ 2/9  Depression screen PHQ 2/9 06/25/2020  Decreased Interest 1  Down, Depressed, Hopeless 1  PHQ - 2 Score 2  Altered sleeping 2  Tired, decreased energy 2  Change in appetite 3  Feeling bad or failure about yourself  3  Trouble concentrating 3  Moving slowly or fidgety/restless 2  Suicidal thoughts 2  PHQ-9 Score 19    Review of Systems  Constitutional: Negative.   HENT: Negative.   Eyes: Negative.   Respiratory: Negative.   Cardiovascular: Negative.   Gastrointestinal: Negative.   Endocrine: Negative.   Genitourinary: Positive for dysuria.       Retention  Musculoskeletal: Positive for back pain.  Skin: Negative.   Allergic/Immunologic: Negative.   Neurological: Positive for dizziness, weakness and headaches.  Psychiatric/Behavioral: Positive for confusion and suicidal ideas (NO PLAN).  All other systems reviewed and are negative.     Objective:   Physical Exam  Constitutional: No distress . Vital signs reviewed. HENT: Normocephalic.  Atraumatic. Eyes: EOMI. No discharge. Cardiovascular: No JVD.   Respiratory: Normal effort.  No stridor.   GI: Non-distended.   Skin: Warm and dry.  Intact. Psych: Normal mood.  Normal behavior. Musc: No edema in extremities.  No tenderness in extremities. Gait: WNL Neuro: Alert Motor: 5/5 throughout    Assessment & Plan:  Female with history of anxiety depression as well as OCD maintained on Luvox, diabetes mellitus obesity with BMI 31.32 colon cancer 2018 with resection followed by chemotherapy presents for hospital follow up after receiving CIR for left cerebellar metastatic adenocarcinoma of colon s/p suboccipital craniectomy on  05/29/2020.    1. Dizziness with blurred vision and decreased functional mobility secondary to left cerebellar intra-axial lesion path + for metastatic adenocarcinoma of colon, now with abdoment and lungs as well. Status post suboccipital craniectomy resection of cerebellar mass 05/29/2020.    Resume therapies  Continue to follow up with Neuro/Onc  Cont Chemo   2. Pain Management - headache:   Improving  D/ced Topamax   3. Orthostatic hypotension  Continue encourage fluids  Don compression hose  4. Myalgia  Encouraged PT  She would like to hold off on Meds at present

## 2020-10-25 ENCOUNTER — Other Ambulatory Visit: Payer: PRIVATE HEALTH INSURANCE

## 2020-10-25 ENCOUNTER — Other Ambulatory Visit: Payer: Self-pay | Admitting: Oncology

## 2020-10-25 ENCOUNTER — Encounter: Payer: Self-pay | Admitting: Oncology

## 2020-10-25 ENCOUNTER — Other Ambulatory Visit: Payer: Self-pay | Admitting: Hematology and Oncology

## 2020-10-25 ENCOUNTER — Inpatient Hospital Stay: Payer: PRIVATE HEALTH INSURANCE

## 2020-10-25 ENCOUNTER — Inpatient Hospital Stay (INDEPENDENT_AMBULATORY_CARE_PROVIDER_SITE_OTHER): Payer: PRIVATE HEALTH INSURANCE | Admitting: Oncology

## 2020-10-25 VITALS — BP 118/79 | HR 107 | Temp 98.9°F | Resp 16 | Ht 68.0 in | Wt 201.1 lb

## 2020-10-25 DIAGNOSIS — C183 Malignant neoplasm of hepatic flexure: Secondary | ICD-10-CM | POA: Diagnosis not present

## 2020-10-25 DIAGNOSIS — C182 Malignant neoplasm of ascending colon: Secondary | ICD-10-CM

## 2020-10-25 DIAGNOSIS — R197 Diarrhea, unspecified: Secondary | ICD-10-CM

## 2020-10-25 DIAGNOSIS — R112 Nausea with vomiting, unspecified: Secondary | ICD-10-CM

## 2020-10-25 DIAGNOSIS — T451X5A Adverse effect of antineoplastic and immunosuppressive drugs, initial encounter: Secondary | ICD-10-CM

## 2020-10-25 LAB — HEPATIC FUNCTION PANEL
ALT: 30 (ref 7–35)
AST: 33 (ref 13–35)
Alkaline Phosphatase: 150 — AB (ref 25–125)
Bilirubin, Total: 0.5

## 2020-10-25 LAB — BASIC METABOLIC PANEL
BUN: 12 (ref 4–21)
CO2: 19 (ref 13–22)
Chloride: 105 (ref 99–108)
Creatinine: 1.1 (ref 0.5–1.1)
Glucose: 242
Potassium: 4 (ref 3.4–5.3)
Sodium: 137 (ref 137–147)

## 2020-10-25 LAB — COMPREHENSIVE METABOLIC PANEL
Albumin: 4.3 (ref 3.5–5.0)
Calcium: 9.1 (ref 8.7–10.7)

## 2020-10-25 LAB — CBC: RBC: 3.95 (ref 3.87–5.11)

## 2020-10-25 LAB — CBC AND DIFFERENTIAL
HCT: 39 (ref 36–46)
Hemoglobin: 13.1 (ref 12.0–16.0)
Neutrophils Absolute: 1.54
Platelets: 190 (ref 150–399)
WBC: 3.2

## 2020-10-29 ENCOUNTER — Inpatient Hospital Stay: Payer: PRIVATE HEALTH INSURANCE

## 2020-10-29 ENCOUNTER — Other Ambulatory Visit: Payer: Self-pay

## 2020-10-29 ENCOUNTER — Ambulatory Visit: Payer: PRIVATE HEALTH INSURANCE | Admitting: Oncology

## 2020-10-29 VITALS — BP 118/74 | HR 100 | Temp 97.5°F | Resp 18 | Ht 68.0 in | Wt 203.0 lb

## 2020-10-29 DIAGNOSIS — C182 Malignant neoplasm of ascending colon: Secondary | ICD-10-CM

## 2020-10-29 DIAGNOSIS — Z5112 Encounter for antineoplastic immunotherapy: Secondary | ICD-10-CM | POA: Diagnosis not present

## 2020-10-29 MED ORDER — ATROPINE SULFATE 1 MG/ML IJ SOLN
INTRAMUSCULAR | Status: AC
  Filled 2020-10-29: qty 1

## 2020-10-29 MED ORDER — SODIUM CHLORIDE 0.9 % IV SOLN
5.0000 mg/kg | Freq: Once | INTRAVENOUS | Status: AC
  Administered 2020-10-29: 500 mg via INTRAVENOUS
  Filled 2020-10-29: qty 16

## 2020-10-29 MED ORDER — DEXAMETHASONE SODIUM PHOSPHATE 100 MG/10ML IJ SOLN
10.0000 mg | Freq: Once | INTRAMUSCULAR | Status: AC
  Administered 2020-10-29: 10 mg via INTRAVENOUS
  Filled 2020-10-29: qty 10

## 2020-10-29 MED ORDER — SODIUM CHLORIDE 0.9 % IV SOLN
Freq: Once | INTRAVENOUS | Status: AC
Start: 2020-10-29 — End: 2020-10-29
  Filled 2020-10-29: qty 250

## 2020-10-29 MED ORDER — SODIUM CHLORIDE 0.9 % IV SOLN
2400.0000 mg/m2 | INTRAVENOUS | Status: DC
  Administered 2020-10-29: 5000 mg via INTRAVENOUS
  Filled 2020-10-29: qty 100

## 2020-10-29 MED ORDER — IRINOTECAN HCL CHEMO INJECTION 100 MG/5ML
180.0000 mg/m2 | Freq: Once | INTRAVENOUS | Status: AC
  Administered 2020-10-29: 380 mg via INTRAVENOUS
  Filled 2020-10-29: qty 15

## 2020-10-29 MED ORDER — FLUOROURACIL CHEMO INJECTION 2.5 GM/50ML
400.0000 mg/m2 | Freq: Once | INTRAVENOUS | Status: AC
  Administered 2020-10-29: 850 mg via INTRAVENOUS
  Filled 2020-10-29: qty 17

## 2020-10-29 MED ORDER — SODIUM CHLORIDE 0.9 % IV SOLN
Freq: Once | INTRAVENOUS | Status: AC
  Filled 2020-10-29: qty 250

## 2020-10-29 MED ORDER — ALTEPLASE 2 MG IJ SOLR
2.0000 mg | Freq: Once | INTRAMUSCULAR | Status: AC | PRN
  Administered 2020-10-29: 2 mg
  Filled 2020-10-29: qty 2

## 2020-10-29 MED ORDER — ATROPINE SULFATE 1 MG/ML IJ SOLN
0.5000 mg | Freq: Once | INTRAMUSCULAR | Status: AC | PRN
  Administered 2020-10-29: 0.5 mg via INTRAVENOUS

## 2020-10-29 MED ORDER — PALONOSETRON HCL INJECTION 0.25 MG/5ML
0.2500 mg | Freq: Once | INTRAVENOUS | Status: AC
  Administered 2020-10-29: 0.25 mg via INTRAVENOUS

## 2020-10-29 MED ORDER — PALONOSETRON HCL INJECTION 0.25 MG/5ML
INTRAVENOUS | Status: AC
  Filled 2020-10-29: qty 5

## 2020-10-29 MED ORDER — LEUCOVORIN CALCIUM INJECTION 350 MG
400.0000 mg/m2 | Freq: Once | INTRAVENOUS | Status: AC
  Administered 2020-10-29: 836 mg via INTRAVENOUS
  Filled 2020-10-29: qty 41.8

## 2020-10-29 MED ORDER — ALTEPLASE 2 MG IJ SOLR
INTRAMUSCULAR | Status: AC
  Filled 2020-10-29: qty 2

## 2020-10-29 NOTE — Patient Instructions (Signed)
Fluorouracil, 5-FU injection What is this medicine? FLUOROURACIL, 5-FU (flure oh YOOR a sil) is a chemotherapy drug. It slows the growth of cancer cells. This medicine is used to treat many types of cancer like breast cancer, colon or rectal cancer, pancreatic cancer, and stomach cancer. This medicine may be used for other purposes; ask your health care provider or pharmacist if you have questions. COMMON BRAND NAME(S): Adrucil What should I tell my health care provider before I take this medicine? They need to know if you have any of these conditions:  blood disorders  dihydropyrimidine dehydrogenase (DPD) deficiency  infection (especially a virus infection such as chickenpox, cold sores, or herpes)  kidney disease  liver disease  malnourished, poor nutrition  recent or ongoing radiation therapy  an unusual or allergic reaction to fluorouracil, other chemotherapy, other medicines, foods, dyes, or preservatives  pregnant or trying to get pregnant  breast-feeding How should I use this medicine? This drug is given as an infusion or injection into a vein. It is administered in a hospital or clinic by a specially trained health care professional. Talk to your pediatrician regarding the use of this medicine in children. Special care may be needed. Overdosage: If you think you have taken too much of this medicine contact a poison control center or emergency room at once. NOTE: This medicine is only for you. Do not share this medicine with others. What if I miss a dose? It is important not to miss your dose. Call your doctor or health care professional if you are unable to keep an appointment. What may interact with this medicine? Do not take this medicine with any of the following medications:  live virus vaccines This medicine may also interact with the following medications:  medicines that treat or prevent blood clots like warfarin, enoxaparin, and dalteparin This list may not  describe all possible interactions. Give your health care provider a list of all the medicines, herbs, non-prescription drugs, or dietary supplements you use. Also tell them if you smoke, drink alcohol, or use illegal drugs. Some items may interact with your medicine. What should I watch for while using this medicine? Visit your doctor for checks on your progress. This drug may make you feel generally unwell. This is not uncommon, as chemotherapy can affect healthy cells as well as cancer cells. Report any side effects. Continue your course of treatment even though you feel ill unless your doctor tells you to stop. In some cases, you may be given additional medicines to help with side effects. Follow all directions for their use. Call your doctor or health care professional for advice if you get a fever, chills or sore throat, or other symptoms of a cold or flu. Do not treat yourself. This drug decreases your body's ability to fight infections. Try to avoid being around people who are sick. This medicine may increase your risk to bruise or bleed. Call your doctor or health care professional if you notice any unusual bleeding. Be careful brushing and flossing your teeth or using a toothpick because you may get an infection or bleed more easily. If you have any dental work done, tell your dentist you are receiving this medicine. Avoid taking products that contain aspirin, acetaminophen, ibuprofen, naproxen, or ketoprofen unless instructed by your doctor. These medicines may hide a fever. Do not become pregnant while taking this medicine. Women should inform their doctor if they wish to become pregnant or think they might be pregnant. There is a potential   for serious side effects to an unborn child. Talk to your health care professional or pharmacist for more information. Do not breast-feed an infant while taking this medicine. Men should inform their doctor if they wish to father a child. This medicine may  lower sperm counts. Do not treat diarrhea with over the counter products. Contact your doctor if you have diarrhea that lasts more than 2 days or if it is severe and watery. This medicine can make you more sensitive to the sun. Keep out of the sun. If you cannot avoid being in the sun, wear protective clothing and use sunscreen. Do not use sun lamps or tanning beds/booths. What side effects may I notice from receiving this medicine? Side effects that you should report to your doctor or health care professional as soon as possible:  allergic reactions like skin rash, itching or hives, swelling of the face, lips, or tongue  low blood counts - this medicine may decrease the number of white blood cells, red blood cells and platelets. You may be at increased risk for infections and bleeding.  signs of infection - fever or chills, cough, sore throat, pain or difficulty passing urine  signs of decreased platelets or bleeding - bruising, pinpoint red spots on the skin, black, tarry stools, blood in the urine  signs of decreased red blood cells - unusually weak or tired, fainting spells, lightheadedness  breathing problems  changes in vision  chest pain  mouth sores  nausea and vomiting  pain, swelling, redness at site where injected  pain, tingling, numbness in the hands or feet  redness, swelling, or sores on hands or feet  stomach pain  unusual bleeding Side effects that usually do not require medical attention (report to your doctor or health care professional if they continue or are bothersome):  changes in finger or toe nails  diarrhea  dry or itchy skin  hair loss  headache  loss of appetite  sensitivity of eyes to the light  stomach upset  unusually teary eyes This list may not describe all possible side effects. Call your doctor for medical advice about side effects. You may report side effects to FDA at 1-800-FDA-1088. Where should I keep my medicine? This  drug is given in a hospital or clinic and will not be stored at home. NOTE: This sheet is a summary. It may not cover all possible information. If you have questions about this medicine, talk to your doctor, pharmacist, or health care provider.  2021 Elsevier/Gold Standard (2019-04-18 15:00:03) Irinotecan injection What is this medicine? IRINOTECAN (ir in oh TEE kan ) is a chemotherapy drug. It is used to treat colon and rectal cancer. This medicine may be used for other purposes; ask your health care provider or pharmacist if you have questions. COMMON BRAND NAME(S): Camptosar What should I tell my health care provider before I take this medicine? They need to know if you have any of these conditions:  dehydration  diarrhea  infection (especially a virus infection such as chickenpox, cold sores, or herpes)  liver disease  low blood counts, like low white cell, platelet, or red cell counts  low levels of calcium, magnesium, or potassium in the blood  recent or ongoing radiation therapy  an unusual or allergic reaction to irinotecan, other medicines, foods, dyes, or preservatives  pregnant or trying to get pregnant  breast-feeding How should I use this medicine? This drug is given as an infusion into a vein. It is administered in a  hospital or clinic by a specially trained health care professional. Talk to your pediatrician regarding the use of this medicine in children. Special care may be needed. Overdosage: If you think you have taken too much of this medicine contact a poison control center or emergency room at once. NOTE: This medicine is only for you. Do not share this medicine with others. What if I miss a dose? It is important not to miss your dose. Call your doctor or health care professional if you are unable to keep an appointment. What may interact with this medicine? Do not take this medicine with any of the following medications:  cobicistat  itraconazole This  medicine may interact with the following medications:  antiviral medicines for HIV or AIDS  certain antibiotics like rifampin or rifabutin  certain medicines for fungal infections like ketoconazole, posaconazole, and voriconazole  certain medicines for seizures like carbamazepine, phenobarbital, phenotoin  clarithromycin  gemfibrozil  nefazodone  St. John's Wort This list may not describe all possible interactions. Give your health care provider a list of all the medicines, herbs, non-prescription drugs, or dietary supplements you use. Also tell them if you smoke, drink alcohol, or use illegal drugs. Some items may interact with your medicine. What should I watch for while using this medicine? Your condition will be monitored carefully while you are receiving this medicine. You will need important blood work done while you are taking this medicine. This drug may make you feel generally unwell. This is not uncommon, as chemotherapy can affect healthy cells as well as cancer cells. Report any side effects. Continue your course of treatment even though you feel ill unless your doctor tells you to stop. In some cases, you may be given additional medicines to help with side effects. Follow all directions for their use. You may get drowsy or dizzy. Do not drive, use machinery, or do anything that needs mental alertness until you know how this medicine affects you. Do not stand or sit up quickly, especially if you are an older patient. This reduces the risk of dizzy or fainting spells. Call your health care professional for advice if you get a fever, chills, or sore throat, or other symptoms of a cold or flu. Do not treat yourself. This medicine decreases your body's ability to fight infections. Try to avoid being around people who are sick. Avoid taking products that contain aspirin, acetaminophen, ibuprofen, naproxen, or ketoprofen unless instructed by your doctor. These medicines may hide a  fever. This medicine may increase your risk to bruise or bleed. Call your doctor or health care professional if you notice any unusual bleeding. Be careful brushing and flossing your teeth or using a toothpick because you may get an infection or bleed more easily. If you have any dental work done, tell your dentist you are receiving this medicine. Do not become pregnant while taking this medicine or for 6 months after stopping it. Women should inform their health care professional if they wish to become pregnant or think they might be pregnant. Men should not father a child while taking this medicine and for 3 months after stopping it. There is potential for serious side effects to an unborn child. Talk to your health care professional for more information. Do not breast-feed an infant while taking this medicine or for 7 days after stopping it. This medicine has caused ovarian failure in some women. This medicine may make it more difficult to get pregnant. Talk to your health care professional if  you are concerned about your fertility. This medicine has caused decreased sperm counts in some men. This may make it more difficult to father a child. Talk to your health care professional if you are concerned about your fertility. What side effects may I notice from receiving this medicine? Side effects that you should report to your doctor or health care professional as soon as possible:  allergic reactions like skin rash, itching or hives, swelling of the face, lips, or tongue  chest pain  diarrhea  flushing, runny nose, sweating during infusion  low blood counts - this medicine may decrease the number of white blood cells, red blood cells and platelets. You may be at increased risk for infections and bleeding.  nausea, vomiting  pain, swelling, warmth in the leg  signs of decreased platelets or bleeding - bruising, pinpoint red spots on the skin, black, tarry stools, blood in the urine  signs  of infection - fever or chills, cough, sore throat, pain or difficulty passing urine  signs of decreased red blood cells - unusually weak or tired, fainting spells, lightheadedness Side effects that usually do not require medical attention (report to your doctor or health care professional if they continue or are bothersome):  constipation  hair loss  headache  loss of appetite  mouth sores  stomach pain This list may not describe all possible side effects. Call your doctor for medical advice about side effects. You may report side effects to FDA at 1-800-FDA-1088. Where should I keep my medicine? This drug is given in a hospital or clinic and will not be stored at home. NOTE: This sheet is a summary. It may not cover all possible information. If you have questions about this medicine, talk to your doctor, pharmacist, or health care provider.  2021 Elsevier/Gold Standard (2019-04-18 17:46:13) Leucovorin injection What is this medicine? LEUCOVORIN (loo koe VOR in) is used to prevent or treat the harmful effects of some medicines. This medicine is used to treat anemia caused by a low amount of folic acid in the body. It is also used with 5-fluorouracil (5-FU) to treat colon cancer. This medicine may be used for other purposes; ask your health care provider or pharmacist if you have questions. What should I tell my health care provider before I take this medicine? They need to know if you have any of these conditions:  anemia from low levels of vitamin B-12 in the blood  an unusual or allergic reaction to leucovorin, folic acid, other medicines, foods, dyes, or preservatives  pregnant or trying to get pregnant  breast-feeding How should I use this medicine? This medicine is for injection into a muscle or into a vein. It is given by a health care professional in a hospital or clinic setting. Talk to your pediatrician regarding the use of this medicine in children. Special care may  be needed. Overdosage: If you think you have taken too much of this medicine contact a poison control center or emergency room at once. NOTE: This medicine is only for you. Do not share this medicine with others. What if I miss a dose? This does not apply. What may interact with this medicine?  capecitabine  fluorouracil  phenobarbital  phenytoin  primidone  trimethoprim-sulfamethoxazole This list may not describe all possible interactions. Give your health care provider a list of all the medicines, herbs, non-prescription drugs, or dietary supplements you use. Also tell them if you smoke, drink alcohol, or use illegal drugs. Some items may interact  with your medicine. What should I watch for while using this medicine? Your condition will be monitored carefully while you are receiving this medicine. This medicine may increase the side effects of 5-fluorouracil, 5-FU. Tell your doctor or health care professional if you have diarrhea or mouth sores that do not get better or that get worse. What side effects may I notice from receiving this medicine? Side effects that you should report to your doctor or health care professional as soon as possible:  allergic reactions like skin rash, itching or hives, swelling of the face, lips, or tongue  breathing problems  fever, infection  mouth sores  unusual bleeding or bruising  unusually weak or tired Side effects that usually do not require medical attention (report to your doctor or health care professional if they continue or are bothersome):  constipation or diarrhea  loss of appetite  nausea, vomiting This list may not describe all possible side effects. Call your doctor for medical advice about side effects. You may report side effects to FDA at 1-800-FDA-1088. Where should I keep my medicine? This drug is given in a hospital or clinic and will not be stored at home. NOTE: This sheet is a summary. It may not cover all possible  information. If you have questions about this medicine, talk to your doctor, pharmacist, or health care provider.  2021 Elsevier/Gold Standard (2007-11-22 16:50:29)

## 2020-10-30 ENCOUNTER — Encounter: Payer: Self-pay | Admitting: Oncology

## 2020-10-31 ENCOUNTER — Other Ambulatory Visit: Payer: Self-pay

## 2020-10-31 ENCOUNTER — Inpatient Hospital Stay: Payer: No Typology Code available for payment source | Attending: Oncology

## 2020-10-31 ENCOUNTER — Inpatient Hospital Stay: Payer: No Typology Code available for payment source

## 2020-10-31 ENCOUNTER — Encounter: Payer: Self-pay | Admitting: Oncology

## 2020-10-31 VITALS — BP 87/59 | HR 105 | Temp 97.5°F | Resp 18 | Ht 68.0 in | Wt 204.0 lb

## 2020-10-31 DIAGNOSIS — C7931 Secondary malignant neoplasm of brain: Secondary | ICD-10-CM | POA: Diagnosis not present

## 2020-10-31 DIAGNOSIS — D701 Agranulocytosis secondary to cancer chemotherapy: Secondary | ICD-10-CM | POA: Insufficient documentation

## 2020-10-31 DIAGNOSIS — C7971 Secondary malignant neoplasm of right adrenal gland: Secondary | ICD-10-CM | POA: Insufficient documentation

## 2020-10-31 DIAGNOSIS — C7989 Secondary malignant neoplasm of other specified sites: Secondary | ICD-10-CM | POA: Insufficient documentation

## 2020-10-31 DIAGNOSIS — Z5112 Encounter for antineoplastic immunotherapy: Secondary | ICD-10-CM | POA: Insufficient documentation

## 2020-10-31 DIAGNOSIS — E860 Dehydration: Secondary | ICD-10-CM | POA: Insufficient documentation

## 2020-10-31 DIAGNOSIS — Z5189 Encounter for other specified aftercare: Secondary | ICD-10-CM | POA: Diagnosis not present

## 2020-10-31 DIAGNOSIS — R197 Diarrhea, unspecified: Secondary | ICD-10-CM | POA: Insufficient documentation

## 2020-10-31 DIAGNOSIS — C189 Malignant neoplasm of colon, unspecified: Secondary | ICD-10-CM | POA: Diagnosis present

## 2020-10-31 DIAGNOSIS — Z5111 Encounter for antineoplastic chemotherapy: Secondary | ICD-10-CM | POA: Diagnosis present

## 2020-10-31 DIAGNOSIS — C182 Malignant neoplasm of ascending colon: Secondary | ICD-10-CM

## 2020-10-31 DIAGNOSIS — C7800 Secondary malignant neoplasm of unspecified lung: Secondary | ICD-10-CM | POA: Diagnosis not present

## 2020-10-31 MED ORDER — SODIUM CHLORIDE 0.9% FLUSH
10.0000 mL | INTRAVENOUS | Status: DC | PRN
  Administered 2020-10-31: 10 mL
  Filled 2020-10-31: qty 10

## 2020-10-31 MED ORDER — SODIUM CHLORIDE 0.9 % IV SOLN
Freq: Once | INTRAVENOUS | Status: AC
  Filled 2020-10-31: qty 250

## 2020-10-31 MED ORDER — ALTEPLASE 2 MG IJ SOLR
2.0000 mg | Freq: Once | INTRAMUSCULAR | Status: AC | PRN
  Administered 2020-10-31: 2 mg
  Filled 2020-10-31: qty 2

## 2020-10-31 MED ORDER — ALTEPLASE 2 MG IJ SOLR
INTRAMUSCULAR | Status: AC
  Filled 2020-10-31: qty 2

## 2020-10-31 MED ORDER — HEPARIN SOD (PORK) LOCK FLUSH 100 UNIT/ML IV SOLN
500.0000 [IU] | Freq: Once | INTRAVENOUS | Status: AC | PRN
  Administered 2020-10-31: 500 [IU]
  Filled 2020-10-31: qty 5

## 2020-10-31 NOTE — Progress Notes (Signed)
1503- discussed bp with irene, PA and Shannon [name], Pharm and patient stated she has been dizzy feeling. Labs ordered and fluid 1L NS.

## 2020-10-31 NOTE — Progress Notes (Signed)
1648 patient is feeling slightly better, bp dicussed with Shannon [name] and waiting on lab results from [facility] to see if she needs anymore tomorrow and possibly more fluids. Patient is aware.

## 2020-10-31 NOTE — Patient Instructions (Signed)
[facility] [place]  Discharge Instructions: Thank you for choosing [facility] to provide your oncology and hematology care.  If you have a lab appointment with the Cancer Center, please go directly to the Cancer Center and check in at the registration area.   Wear comfortable clothing and clothing appropriate for easy access to any Portacath or PICC line.   We strive to give you quality time with your provider. You may need to reschedule your appointment if you arrive late (15 or more minutes).  Arriving late affects you and other patients whose appointments are after yours.  Also, if you miss three or more appointments without notifying the office, you may be dismissed from the clinic at the provider's discretion.      For prescription refill requests, have your pharmacy contact our office and allow 72 hours for refills to be completed.    Today you received the following chemotherapy and/or immunotherapy agents Fluorouracil    To help prevent nausea and vomiting after your treatment, we encourage you to take your nausea medication as directed.  BELOW ARE SYMPTOMS THAT SHOULD BE REPORTED IMMEDIATELY: *FEVER GREATER THAN 100.4 F (38 C) OR HIGHER *CHILLS OR SWEATING *NAUSEA AND VOMITING THAT IS NOT CONTROLLED WITH YOUR NAUSEA MEDICATION *UNUSUAL SHORTNESS OF BREATH *UNUSUAL BRUISING OR BLEEDING *URINARY PROBLEMS (pain or burning when urinating, or frequent urination) *BOWEL PROBLEMS (unusual diarrhea, constipation, pain near the anus) TENDERNESS IN MOUTH AND THROAT WITH OR WITHOUT PRESENCE OF ULCERS (sore throat, sores in mouth, or a toothache) UNUSUAL RASH, SWELLING OR PAIN  UNUSUAL VAGINAL DISCHARGE OR ITCHING   Items with * indicate a potential emergency and should be followed up as soon as possible or go to the Emergency Department if any problems should occur.  Please show the CHEMOTHERAPY ALERT CARD or IMMUNOTHERAPY ALERT CARD at check-in to the  Emergency Department and triage nurse.  Should you have questions after your visit or need to cancel or reschedule your appointment, please contact [facility] [place]  Dept: [phone]  and follow the prompts.  Office hours are 8:00 a.m. to 4:30 p.m. Monday - Friday. Please note that voicemails left after 4:00 p.m. may not be returned until the following business day.  We are closed weekends and major holidays. You have access to a nurse at all times for urgent questions. Please call the main number to the clinic Dept: [phone] and follow the prompts.  For any non-urgent questions, you may also contact your provider using MyChart. We now offer e-Visits for anyone 18 and older to request care online for non-urgent symptoms. For details visit [email or web address].   Also download the MyChart app! Go to the app store, search "MyChart", open the app, select [place], and log in with your MyChart username and password.  Due to Covid, a mask is required upon entering the hospital/clinic. If you do not have a mask, one will be given to you upon arrival. For doctor visits, patients may have 1 support person aged 18 or older with them. For treatment visits, patients cannot have anyone with them due to current Covid guidelines and our immunocompromised population.    

## 2020-11-01 ENCOUNTER — Encounter: Payer: Self-pay | Admitting: Hematology and Oncology

## 2020-11-01 ENCOUNTER — Inpatient Hospital Stay: Payer: No Typology Code available for payment source

## 2020-11-01 VITALS — BP 121/80 | HR 90 | Temp 98.1°F | Resp 16 | Ht 68.0 in | Wt 203.4 lb

## 2020-11-01 DIAGNOSIS — R197 Diarrhea, unspecified: Secondary | ICD-10-CM

## 2020-11-01 DIAGNOSIS — Z5111 Encounter for antineoplastic chemotherapy: Secondary | ICD-10-CM | POA: Diagnosis not present

## 2020-11-01 DIAGNOSIS — E860 Dehydration: Secondary | ICD-10-CM

## 2020-11-01 DIAGNOSIS — Z5112 Encounter for antineoplastic immunotherapy: Secondary | ICD-10-CM | POA: Diagnosis not present

## 2020-11-01 MED ORDER — SODIUM CHLORIDE 0.9% FLUSH
10.0000 mL | Freq: Once | INTRAVENOUS | Status: AC | PRN
  Administered 2020-11-01: 10 mL
  Filled 2020-11-01: qty 10

## 2020-11-01 MED ORDER — HEPARIN SOD (PORK) LOCK FLUSH 100 UNIT/ML IV SOLN
500.0000 [IU] | Freq: Once | INTRAVENOUS | Status: AC | PRN
Start: 2020-11-01 — End: 2020-11-01
  Administered 2020-11-01: 500 [IU]
  Filled 2020-11-01: qty 5

## 2020-11-01 MED ORDER — SODIUM CHLORIDE 0.9 % IV SOLN
Freq: Once | INTRAVENOUS | Status: AC
  Filled 2020-11-01: qty 250

## 2020-11-04 ENCOUNTER — Encounter: Payer: Self-pay | Admitting: Oncology

## 2020-11-04 ENCOUNTER — Encounter: Payer: Self-pay | Admitting: Hematology and Oncology

## 2020-11-04 ENCOUNTER — Other Ambulatory Visit: Payer: Self-pay | Admitting: *Deleted

## 2020-11-04 DIAGNOSIS — Z95828 Presence of other vascular implants and grafts: Secondary | ICD-10-CM

## 2020-11-04 NOTE — Progress Notes (Signed)
  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   Plan: The patient will receive a call in about one month from the radiation oncology department. She will continue follow up with Dr. Bobby Rumpf as well be followed in the brain oncology program with Dr. Mickeal Skinner in surveillance.   ________________________________________________    Carola Rhine, [facility]

## 2020-11-05 ENCOUNTER — Encounter: Payer: Self-pay | Admitting: Oncology

## 2020-11-05 NOTE — Progress Notes (Signed)
[facility]  [address] [place],  [name]  [address] [phone]  Clinic Day:  11/07/2020  Referring physician: Greig Right, MD  This document serves as a record of services personally performed by Dequincy Macarthur Critchley, MD. It was created on their behalf by [facility] E, a trained medical scribe. The creation of this record is based on the scribe's personal observations and the provider's statements to them.  HISTORY OF PRESENT ILLNESS:  The patient is a 55 y.o. female with metastatic colon cancer, which includes spread of disease to her abdominal cavity, lungs, brain and right adrenal gland. She comes in today to be evaluated before heading into her 8th cycle of FOLFIRI/Avastin.  The patient claims to have toleraed her 7th cycle of treatment fairly well.  She has had slightly more diarrhea, which led to her needing 3 liters of fluid this week to prevent severe dehydration.  She still complains of mouth soreness/sensitivity, but denies having having any obvious mouth sores.  She denies having any new GI or other systemic symptoms which concern her for disease progression.   With respect to her colon cancer history, she is status post a right hemicolectomy in early October 2018, followed by 12 cycles of adjuvant chemotherapy, which were completed in April 2019.  In December 2021, she underwent a left cerebellar metastatectomy, whose pathology was consistent with metastatic colon cancer.  Tumor testing did come back MMR normal.  CT scans recently showed evidence of her cancer being in multiple locations, which has her on palliative chemotherapy at this time.    PHYSICAL EXAM:  Blood pressure 121/78, pulse 100, temperature 98.3 F (36.8 C), resp. rate 16, height 5' 8"  (1.727 m), weight 198 lb 9.6 oz (90.1 kg), last menstrual period 02/22/2019, SpO2 97 %. Wt Readings from Last 3 Encounters:  11/07/20 198 lb 9.6 oz (90.1 kg)  11/01/20 203 lb 6.4 oz (92.3 kg)   10/31/20 204 lb (92.5 kg)   Body mass index is 30.2 kg/m. Performance status (ECOG): 1 Physical Exam Constitutional:      Appearance: Normal appearance. She is not ill-appearing.  HENT:     Mouth/Throat:     Mouth: Mucous membranes are moist.     Pharynx: Oropharynx is clear. No oropharyngeal exudate or posterior oropharyngeal erythema.  Cardiovascular:     Rate and Rhythm: Normal rate and regular rhythm.     Heart sounds: No murmur heard.   No friction rub. No gallop.  Pulmonary:     Effort: Pulmonary effort is normal. No respiratory distress.     Breath sounds: Normal breath sounds. No wheezing, rhonchi or rales.  Chest:  Breasts:    Right: No axillary adenopathy or supraclavicular adenopathy.     Left: No axillary adenopathy or supraclavicular adenopathy.  Abdominal:     General: Bowel sounds are normal. There is no distension.     Palpations: Abdomen is soft. There is no mass.     Tenderness: There is no abdominal tenderness.  Musculoskeletal:        General: No swelling.     Right lower leg: No edema.     Left lower leg: No edema.  Lymphadenopathy:     Cervical: No cervical adenopathy.     Upper Body:     Right upper body: No supraclavicular or axillary adenopathy.     Left upper body: No supraclavicular or axillary adenopathy.     Lower Body: No right inguinal adenopathy. No left inguinal adenopathy.  Skin:    General: Skin is warm.     Coloration: Skin is not jaundiced.     Findings: No lesion or rash.  Neurological:     General: No focal deficit present.     Mental Status: She is alert and oriented to person, place, and time. Mental status is at baseline.     Cranial Nerves: Cranial nerves are intact.  Psychiatric:        Mood and Affect: Mood normal.        Behavior: Behavior normal.        Thought Content: Thought content normal.    LABS:      ASSESSMENT & PLAN:  Assessment/Plan:  A 55 y.o. female with metastatic colon cancer.  She will proceed  with her 8th cycle of FOLFIRI/Avastin next week.  Clinically, she appears to be doing okay.  As she was orthostatic in clinic today, I will arrange for her to receive another liter of fluids.  I will also arrange for her to receive Xarzio 480 mcg today to hasten her white count recovery as she heads into her 8th cycle of treatment.  I will see her back in 2 weeks before she heads into her 9th cycle of  FOLFIRI/Avastin.  CT scans will be done before her next visit to ascertain her new disease baseline after receiving 8 cycles of FOLFIRI/Avastin.  The patient understands all the plans discussed today and is in agreement with them.     I, Rita Ohara, am acting as scribe for Marice Potter, MD    I have reviewed this report as typed by the medical scribe, and it is complete and accurate.  Dequincy Macarthur Critchley, MD

## 2020-11-07 ENCOUNTER — Encounter: Payer: Self-pay | Admitting: Oncology

## 2020-11-07 ENCOUNTER — Other Ambulatory Visit: Payer: Self-pay

## 2020-11-07 ENCOUNTER — Inpatient Hospital Stay (INDEPENDENT_AMBULATORY_CARE_PROVIDER_SITE_OTHER): Payer: No Typology Code available for payment source | Admitting: Oncology

## 2020-11-07 ENCOUNTER — Other Ambulatory Visit: Payer: Self-pay | Admitting: Oncology

## 2020-11-07 ENCOUNTER — Inpatient Hospital Stay: Payer: No Typology Code available for payment source

## 2020-11-07 VITALS — BP 121/78 | HR 100 | Temp 98.3°F | Resp 16 | Ht 68.0 in | Wt 198.6 lb

## 2020-11-07 VITALS — BP 116/75 | HR 99 | Temp 98.1°F | Resp 18

## 2020-11-07 DIAGNOSIS — C182 Malignant neoplasm of ascending colon: Secondary | ICD-10-CM

## 2020-11-07 DIAGNOSIS — C189 Malignant neoplasm of colon, unspecified: Secondary | ICD-10-CM

## 2020-11-07 DIAGNOSIS — Z5111 Encounter for antineoplastic chemotherapy: Secondary | ICD-10-CM | POA: Diagnosis not present

## 2020-11-07 DIAGNOSIS — Z5112 Encounter for antineoplastic immunotherapy: Secondary | ICD-10-CM | POA: Diagnosis not present

## 2020-11-07 LAB — HEPATIC FUNCTION PANEL
ALT: 31 (ref 7–35)
AST: 30 (ref 13–35)
Alkaline Phosphatase: 132 — AB (ref 25–125)
Bilirubin, Total: 0.3

## 2020-11-07 LAB — CBC: RBC: 3.59 — AB (ref 3.87–5.11)

## 2020-11-07 LAB — CBC AND DIFFERENTIAL
HCT: 35 — AB (ref 36–46)
Hemoglobin: 11.7 — AB (ref 12.0–16.0)
Neutrophils Absolute: 0.76
Platelets: 146 — AB (ref 150–399)
WBC: 2.1

## 2020-11-07 LAB — BASIC METABOLIC PANEL
BUN: 16 (ref 4–21)
CO2: 20 (ref 13–22)
Chloride: 105 (ref 99–108)
Creatinine: 1.1 (ref 0.5–1.1)
Glucose: 380
Potassium: 4 (ref 3.4–5.3)
Sodium: 135 — AB (ref 137–147)

## 2020-11-07 LAB — COMPREHENSIVE METABOLIC PANEL
Albumin: 4 (ref 3.5–5.0)
Calcium: 8.2 — AB (ref 8.7–10.7)

## 2020-11-07 MED ORDER — HEPARIN SOD (PORK) LOCK FLUSH 100 UNIT/ML IV SOLN
500.0000 [IU] | Freq: Once | INTRAVENOUS | Status: AC | PRN
  Administered 2020-11-07: 500 [IU]
  Filled 2020-11-07: qty 5

## 2020-11-07 MED ORDER — FILGRASTIM-SNDZ 480 MCG/0.8ML IJ SOSY
480.0000 ug | PREFILLED_SYRINGE | Freq: Once | INTRAMUSCULAR | Status: AC
  Administered 2020-11-07: 480 ug via SUBCUTANEOUS

## 2020-11-07 MED ORDER — FILGRASTIM-SNDZ 480 MCG/0.8ML IJ SOSY
PREFILLED_SYRINGE | INTRAMUSCULAR | Status: AC
  Filled 2020-11-07: qty 0.8

## 2020-11-07 MED ORDER — SODIUM CHLORIDE 0.9 % IV SOLN
Freq: Once | INTRAVENOUS | Status: AC
  Filled 2020-11-07: qty 250

## 2020-11-07 NOTE — Patient Instructions (Signed)

## 2020-11-08 ENCOUNTER — Inpatient Hospital Stay: Payer: No Typology Code available for payment source

## 2020-11-08 ENCOUNTER — Encounter: Payer: Self-pay | Admitting: Oncology

## 2020-11-08 ENCOUNTER — Encounter: Payer: Self-pay | Admitting: Hematology and Oncology

## 2020-11-08 VITALS — BP 133/79 | HR 92 | Temp 98.1°F | Resp 18 | Wt 203.1 lb

## 2020-11-08 DIAGNOSIS — Z5112 Encounter for antineoplastic immunotherapy: Secondary | ICD-10-CM | POA: Diagnosis not present

## 2020-11-08 DIAGNOSIS — E860 Dehydration: Secondary | ICD-10-CM

## 2020-11-08 DIAGNOSIS — Z5111 Encounter for antineoplastic chemotherapy: Secondary | ICD-10-CM | POA: Diagnosis not present

## 2020-11-08 DIAGNOSIS — R197 Diarrhea, unspecified: Secondary | ICD-10-CM

## 2020-11-08 MED ORDER — HEPARIN SOD (PORK) LOCK FLUSH 100 UNIT/ML IV SOLN
500.0000 [IU] | Freq: Once | INTRAVENOUS | Status: AC | PRN
  Administered 2020-11-08: 500 [IU]
  Filled 2020-11-08: qty 5

## 2020-11-08 MED ORDER — SODIUM CHLORIDE 0.9 % IV SOLN
Freq: Once | INTRAVENOUS | Status: AC
  Filled 2020-11-08: qty 250

## 2020-11-08 NOTE — Patient Instructions (Signed)

## 2020-11-11 ENCOUNTER — Other Ambulatory Visit: Payer: PRIVATE HEALTH INSURANCE

## 2020-11-11 ENCOUNTER — Encounter: Payer: Self-pay | Admitting: Hematology and Oncology

## 2020-11-11 ENCOUNTER — Inpatient Hospital Stay: Payer: No Typology Code available for payment source

## 2020-11-11 ENCOUNTER — Encounter: Payer: Self-pay | Admitting: Oncology

## 2020-11-11 ENCOUNTER — Other Ambulatory Visit: Payer: Self-pay | Admitting: Oncology

## 2020-11-11 ENCOUNTER — Other Ambulatory Visit: Payer: Self-pay

## 2020-11-11 DIAGNOSIS — C182 Malignant neoplasm of ascending colon: Secondary | ICD-10-CM

## 2020-11-11 LAB — CBC AND DIFFERENTIAL
HCT: 38 (ref 36–46)
Hemoglobin: 12.3 (ref 12.0–16.0)
Neutrophils Absolute: 1.98
Platelets: 118 — AB (ref 150–399)
WBC: 3.6

## 2020-11-11 LAB — CBC: RBC: 3.84 — AB (ref 3.87–5.11)

## 2020-11-12 ENCOUNTER — Encounter: Payer: Self-pay | Admitting: Oncology

## 2020-11-12 ENCOUNTER — Inpatient Hospital Stay: Payer: No Typology Code available for payment source

## 2020-11-12 VITALS — BP 141/89 | HR 78 | Temp 98.2°F | Resp 18 | Wt 202.1 lb

## 2020-11-12 DIAGNOSIS — Z5111 Encounter for antineoplastic chemotherapy: Secondary | ICD-10-CM | POA: Diagnosis not present

## 2020-11-12 DIAGNOSIS — C182 Malignant neoplasm of ascending colon: Secondary | ICD-10-CM

## 2020-11-12 DIAGNOSIS — Z5112 Encounter for antineoplastic immunotherapy: Secondary | ICD-10-CM | POA: Diagnosis not present

## 2020-11-12 MED ORDER — LEUCOVORIN CALCIUM INJECTION 350 MG
400.0000 mg/m2 | Freq: Once | INTRAVENOUS | Status: AC
  Administered 2020-11-12: 836 mg via INTRAVENOUS
  Filled 2020-11-12: qty 41.8

## 2020-11-12 MED ORDER — LORAZEPAM 2 MG/ML IJ SOLN
INTRAMUSCULAR | Status: AC
  Filled 2020-11-12: qty 1

## 2020-11-12 MED ORDER — PALONOSETRON HCL INJECTION 0.25 MG/5ML
INTRAVENOUS | Status: AC
  Filled 2020-11-12: qty 5

## 2020-11-12 MED ORDER — SODIUM CHLORIDE 0.9 % IV SOLN
5.0000 mg/kg | Freq: Once | INTRAVENOUS | Status: AC
  Administered 2020-11-12: 500 mg via INTRAVENOUS
  Filled 2020-11-12: qty 16

## 2020-11-12 MED ORDER — SODIUM CHLORIDE 0.9 % IV SOLN
Freq: Once | INTRAVENOUS | Status: AC
  Filled 2020-11-12: qty 5

## 2020-11-12 MED ORDER — IRINOTECAN HCL CHEMO INJECTION 100 MG/5ML
180.0000 mg/m2 | Freq: Once | INTRAVENOUS | Status: AC
  Administered 2020-11-12: 380 mg via INTRAVENOUS
  Filled 2020-11-12: qty 15

## 2020-11-12 MED ORDER — LORAZEPAM 2 MG/ML IJ SOLN
1.0000 mg | Freq: Once | INTRAMUSCULAR | Status: AC
  Administered 2020-11-12: 1 mg via INTRAVENOUS

## 2020-11-12 MED ORDER — SODIUM CHLORIDE 0.9 % IV SOLN
Freq: Once | INTRAVENOUS | Status: AC
Start: 2020-11-12 — End: 2020-11-12
  Filled 2020-11-12: qty 250

## 2020-11-12 MED ORDER — FLUOROURACIL CHEMO INJECTION 2.5 GM/50ML
400.0000 mg/m2 | Freq: Once | INTRAVENOUS | Status: AC
  Administered 2020-11-12: 850 mg via INTRAVENOUS
  Filled 2020-11-12: qty 17

## 2020-11-12 MED ORDER — ATROPINE SULFATE 1 MG/ML IJ SOLN
0.5000 mg | Freq: Once | INTRAMUSCULAR | Status: AC | PRN
  Administered 2020-11-12: 0.5 mg via INTRAVENOUS

## 2020-11-12 MED ORDER — PALONOSETRON HCL INJECTION 0.25 MG/5ML
0.2500 mg | Freq: Once | INTRAVENOUS | Status: AC
  Administered 2020-11-12: 0.25 mg via INTRAVENOUS

## 2020-11-12 MED ORDER — SODIUM CHLORIDE 0.9 % IV SOLN
10.0000 mg | Freq: Once | INTRAVENOUS | Status: AC
  Administered 2020-11-12: 10 mg via INTRAVENOUS
  Filled 2020-11-12: qty 10

## 2020-11-12 MED ORDER — SODIUM CHLORIDE 0.9 % IV SOLN
2400.0000 mg/m2 | INTRAVENOUS | Status: DC
  Administered 2020-11-12: 5000 mg via INTRAVENOUS
  Filled 2020-11-12: qty 100

## 2020-11-12 MED ORDER — ATROPINE SULFATE 1 MG/ML IJ SOLN
INTRAMUSCULAR | Status: AC
  Filled 2020-11-12: qty 1

## 2020-11-12 NOTE — Progress Notes (Signed)
Pt d/c stable at 1405 

## 2020-11-12 NOTE — Patient Instructions (Signed)
[name]  Discharge Instructions: Thank you for choosing [facility] to provide your oncology and hematology care.  If you have a lab appointment with the [facility], please go directly to the [facility] and check in at the registration area.   Wear comfortable clothing and clothing appropriate for easy access to any Portacath or PICC line.   We strive to give you quality time with your provider. You may need to reschedule your appointment if you arrive late (15 or more minutes).  Arriving late affects you and other patients whose appointments are after yours.  Also, if you miss three or more appointments without notifying the office, you may be dismissed from the clinic at the provider's discretion.      For prescription refill requests, have your pharmacy contact our office and allow 72 hours for refills to be completed.    Today you received the following chemotherapy and/or immunotherapy agents Folfiri/bev   To help prevent nausea and vomiting after your treatment, we encourage you to take your nausea medication as directed.  BELOW ARE SYMPTOMS THAT SHOULD BE REPORTED IMMEDIATELY: *FEVER GREATER THAN 100.4 F (38 C) OR HIGHER *CHILLS OR SWEATING *NAUSEA AND VOMITING THAT IS NOT CONTROLLED WITH YOUR NAUSEA MEDICATION *UNUSUAL SHORTNESS OF BREATH *UNUSUAL BRUISING OR BLEEDING *URINARY PROBLEMS (pain or burning when urinating, or frequent urination) *BOWEL PROBLEMS (unusual diarrhea, constipation, pain near the anus) TENDERNESS IN MOUTH AND THROAT WITH OR WITHOUT PRESENCE OF ULCERS (sore throat, sores in mouth, or a toothache) UNUSUAL RASH, SWELLING OR PAIN  UNUSUAL VAGINAL DISCHARGE OR ITCHING   Items with * indicate a potential emergency and should be followed up as soon as possible or go to the Emergency Department if any problems should occur.  Please show the CHEMOTHERAPY ALERT CARD or IMMUNOTHERAPY ALERT CARD at check-in to the  Emergency Department and triage nurse.  Should you have questions after your visit or need to cancel or reschedule your appointment, please contact [facility]  Dept: [phone]  and follow the prompts.  Office hours are 8:00 a.m. to 4:30 p.m. Monday - Friday. Please note that voicemails left after 4:00 p.m. may not be returned until the following business day.  We are closed weekends and major holidays. You have access to a nurse at all times for urgent questions. Please call the main number to the clinic Dept: [phone] and follow the prompts.  For any non-urgent questions, you may also contact your provider using MyChart. We now offer e-Visits for anyone 48 and older to request care online for non-urgent symptoms. For details visit [email or web address].   Also download the MyChart app! Go to the app store, search "MyChart", open the app, select [facility], and log in with your MyChart username and password.  Due to Covid, a mask is required upon entering the hospital/clinic. If you do not have a mask, one will be given to you upon arrival. For doctor visits, patients may have 1 support person aged 23 or older with them. For treatment visits, patients cannot have anyone with them due to current Covid guidelines and our immunocompromised population.   Irinotecan injection What is this medication? IRINOTECAN (ir in oh TEE kan ) is a chemotherapy drug. It is used to treatcolon and rectal cancer. This medicine may be used for other purposes; ask your health care provider orpharmacist if you have questions. COMMON BRAND NAME(S): Camptosar What should I tell my care team  before I take this medication? They need to know if you have any of these conditions: dehydration diarrhea infection (especially a virus infection such as chickenpox, cold sores, or herpes) liver disease low blood counts, like low white cell, platelet, or red cell counts low levels of calcium,  magnesium, or potassium in the blood recent or ongoing radiation therapy an unusual or allergic reaction to irinotecan, other medicines, foods, dyes, or preservatives pregnant or trying to get pregnant breast-feeding How should I use this medication? This drug is given as an infusion into a vein. It is administered in a hospitalor clinic by a specially trained health care professional. Talk to your pediatrician regarding the use of this medicine in children.Special care may be needed. Overdosage: If you think you have taken too much of this medicine contact apoison control center or emergency room at once. NOTE: This medicine is only for you. Do not share this medicine with others. What if I miss a dose? It is important not to miss your dose. Call your doctor or health careprofessional if you are unable to keep an appointment. What may interact with this medication? Do not take this medicine with any of the following medications: cobicistat itraconazole This medicine may interact with the following medications: antiviral medicines for HIV or AIDS certain antibiotics like rifampin or rifabutin certain medicines for fungal infections like ketoconazole, posaconazole, and voriconazole certain medicines for seizures like carbamazepine, phenobarbital, phenotoin clarithromycin gemfibrozil nefazodone [facility] John's Wort This list may not describe all possible interactions. Give your health care provider a list of all the medicines, herbs, non-prescription drugs, or dietary supplements you use. Also tell them if you smoke, drink alcohol, or use illegaldrugs. Some items may interact with your medicine. What should I watch for while using this medication? Your condition will be monitored carefully while you are receiving this medicine. You will need important blood work done while you are taking thismedicine. This drug may make you feel generally unwell. This is not uncommon, as chemotherapy can affect  healthy cells as well as cancer cells. Report any side effects. Continue your course of treatment even though you feel ill unless yourdoctor tells you to stop. In some cases, you may be given additional medicines to help with side effects.Follow all directions for their use. You may get drowsy or dizzy. Do not drive, use machinery, or do anything that needs mental alertness until you know how this medicine affects you. Do not stand or sit up quickly, especially if you are an older patient. This reducesthe risk of dizzy or fainting spells. Call your health care professional for advice if you get a fever, chills, or sore throat, or other symptoms of a cold or flu. Do not treat yourself. This medicine decreases your body's ability to fight infections. Try to avoid beingaround people who are sick. Avoid taking products that contain aspirin, acetaminophen, ibuprofen, naproxen, or ketoprofen unless instructed by your doctor. These medicines may hide afever. This medicine may increase your risk to bruise or bleed. Call your doctor orhealth care professional if you notice any unusual bleeding. Be careful brushing and flossing your teeth or using a toothpick because you may get an infection or bleed more easily. If you have any dental work done,tell your dentist you are receiving this medicine. Do not become pregnant while taking this medicine or for 6 months after stopping it. Women should inform their health care professional if they wish to become pregnant or think they might be pregnant. Men should  not father a child while taking this medicine and for 3 months after stopping it. There is potential for serious side effects to an unborn child. Talk to your health careprofessional for more information. Do not breast-feed an infant while taking this medicine or for 7 days afterstopping it. This medicine has caused ovarian failure in some women. This medicine may make it more difficult to get pregnant. Talk to your  health care professional if [place] concerned about your fertility. This medicine has caused decreased sperm counts in some men. This may make it more difficult to father a child. Talk to your health care professional if [place] concerned about your fertility. What side effects may I notice from receiving this medication? Side effects that you should report to your doctor or health care professionalas soon as possible: allergic reactions like skin rash, itching or hives, swelling of the face, lips, or tongue chest pain diarrhea flushing, runny nose, sweating during infusion low blood counts - this medicine may decrease the number of white blood cells, red blood cells and platelets. You may be at increased risk for infections and bleeding. nausea, vomiting pain, swelling, warmth in the leg signs of decreased platelets or bleeding - bruising, pinpoint red spots on the skin, black, tarry stools, blood in the urine signs of infection - fever or chills, cough, sore throat, pain or difficulty passing urine signs of decreased red blood cells - unusually weak or tired, fainting spells, lightheadedness Side effects that usually do not require medical attention (report to yourdoctor or health care professional if they continue or are bothersome): constipation hair loss headache loss of appetite mouth sores stomach pain This list may not describe all possible side effects. Call your doctor for medical advice about side effects. You may report side effects to FDA at1-800-FDA-1088. Where should I keep my medication? This drug is given in a hospital or clinic and will not be stored at home. NOTE: This sheet is a summary. It may not cover all possible information. If you have questions about this medicine, talk to your doctor, pharmacist, orhealth care provider.  2022 Elsevier/Gold Standard (2019-04-18 17:46:13) Fluorouracil, 5-FU injection What is this medication? FLUOROURACIL, 5-FU (flure oh YOOR a  sil) is a chemotherapy drug. It slows the growth of cancer cells. This medicine is used to treat many types of cancer like breast cancer, colon or rectal cancer, pancreatic cancer, and stomachcancer. This medicine may be used for other purposes; ask your health care provider orpharmacist if you have questions. COMMON BRAND NAME(S): Adrucil What should I tell my care team before I take this medication? They need to know if you have any of these conditions: blood disorders dihydropyrimidine dehydrogenase (DPD) deficiency infection (especially a virus infection such as chickenpox, cold sores, or herpes) kidney disease liver disease malnourished, poor nutrition recent or ongoing radiation therapy an unusual or allergic reaction to fluorouracil, other chemotherapy, other medicines, foods, dyes, or preservatives pregnant or trying to get pregnant breast-feeding How should I use this medication? This drug is given as an infusion or injection into a vein. It is administeredin a hospital or clinic by a specially trained health care professional. Talk to your pediatrician regarding the use of this medicine in children.Special care may be needed. Overdosage: If you think you have taken too much of this medicine contact apoison control center or emergency room at once. NOTE: This medicine is only for you. Do not share this medicine with others. What if I miss a dose? It is  important not to miss your dose. Call your doctor or health careprofessional if you are unable to keep an appointment. What may interact with this medication? Do not take this medicine with any of the following medications: live virus vaccines This medicine may also interact with the following medications: medicines that treat or prevent blood clots like warfarin, enoxaparin, and dalteparin This list may not describe all possible interactions. Give your health care provider a list of all the medicines, herbs, non-prescription drugs,  or dietary supplements you use. Also tell them if you smoke, drink alcohol, or use illegaldrugs. Some items may interact with your medicine. What should I watch for while using this medication? Visit your doctor for checks on your progress. This drug may make you feel generally unwell. This is not uncommon, as chemotherapy can affect healthy cells as well as cancer cells. Report any side effects. Continue your course oftreatment even though you feel ill unless your doctor tells you to stop. In some cases, you may be given additional medicines to help with side effects.Follow all directions for their use. Call your doctor or health care professional for advice if you get a fever, chills or sore throat, or other symptoms of a cold or flu. Do not treat yourself. This drug decreases your body's ability to fight infections. Try toavoid being around people who are sick. This medicine may increase your risk to bruise or bleed. Call your doctor orhealth care professional if you notice any unusual bleeding. Be careful brushing and flossing your teeth or using a toothpick because you may get an infection or bleed more easily. If you have any dental work done,tell your dentist you are receiving this medicine. Avoid taking products that contain aspirin, acetaminophen, ibuprofen, naproxen, or ketoprofen unless instructed by your doctor. These medicines may hide afever. Do not become pregnant while taking this medicine. Women should inform their doctor if they wish to become pregnant or think they might be pregnant. There is a potential for serious side effects to an unborn child. Talk to your health care professional or pharmacist for more information. Do not breast-feed aninfant while taking this medicine. Men should inform their doctor if they wish to father a child. This medicinemay lower sperm counts. Do not treat diarrhea with over the counter products. Contact your doctor ifyou have diarrhea that lasts more than  2 days or if it is severe and watery. This medicine can make you more sensitive to the sun. Keep out of the sun. If you cannot avoid being in the sun, wear protective clothing and use sunscreen.Do not use sun lamps or tanning beds/booths. What side effects may I notice from receiving this medication? Side effects that you should report to your doctor or health care professionalas soon as possible: allergic reactions like skin rash, itching or hives, swelling of the face, lips, or tongue low blood counts - this medicine may decrease the number of white blood cells, red blood cells and platelets. You may be at increased risk for infections and bleeding. signs of infection - fever or chills, cough, sore throat, pain or difficulty passing urine signs of decreased platelets or bleeding - bruising, pinpoint red spots on the skin, black, tarry stools, blood in the urine signs of decreased red blood cells - unusually weak or tired, fainting spells, lightheadedness breathing problems changes in vision chest pain mouth sores nausea and vomiting pain, swelling, redness at site where injected pain, tingling, numbness in the hands or feet redness, swelling, or sores  on hands or feet stomach pain unusual bleeding Side effects that usually do not require medical attention (report to yourdoctor or health care professional if they continue or are bothersome): changes in finger or toe nails diarrhea dry or itchy skin hair loss headache loss of appetite sensitivity of eyes to the light stomach upset unusually teary eyes This list may not describe all possible side effects. Call your doctor for medical advice about side effects. You may report side effects to FDA at1-800-FDA-1088. Where should I keep my medication? This drug is given in a hospital or clinic and will not be stored at home. NOTE: This sheet is a summary. It may not cover all possible information. If you have questions about this medicine,  talk to your doctor, pharmacist, orhealth care provider.  2022 Elsevier/Gold Standard (2019-04-18 15:00:03) Leucovorin injection What is this medication? LEUCOVORIN (loo koe VOR in) is used to prevent or treat the harmful effects of some medicines. This medicine is used to treat anemia caused by a low amount of folic acid in the body. It is also used with 5-fluorouracil (5-FU) to treatcolon cancer. This medicine may be used for other purposes; ask your health care provider orpharmacist if you have questions. What should I tell my care team before I take this medication? They need to know if you have any of these conditions: anemia from low levels of vitamin B-12 in the blood an unusual or allergic reaction to leucovorin, folic acid, other medicines, foods, dyes, or preservatives pregnant or trying to get pregnant breast-feeding How should I use this medication? This medicine is for injection into a muscle or into a vein. It is given by ahealth care professional in a hospital or clinic setting. Talk to your pediatrician regarding the use of this medicine in children.Special care may be needed. Overdosage: If you think you have taken too much of this medicine contact apoison control center or emergency room at once. NOTE: This medicine is only for you. Do not share this medicine with others. What if I miss a dose? This does not apply. What may interact with this medication? capecitabine fluorouracil phenobarbital phenytoin primidone trimethoprim-sulfamethoxazole This list may not describe all possible interactions. Give your health care provider a list of all the medicines, herbs, non-prescription drugs, or dietary supplements you use. Also tell them if you smoke, drink alcohol, or use illegaldrugs. Some items may interact with your medicine. What should I watch for while using this medication? Your condition will be monitored carefully while you are receiving thismedicine. This medicine  may increase the side effects of 5-fluorouracil, 5-FU. Tell your doctor or health care professional if you have diarrhea or mouth sores that donot get better or that get worse. What side effects may I notice from receiving this medication? Side effects that you should report to your doctor or health care professionalas soon as possible: allergic reactions like skin rash, itching or hives, swelling of the face, lips, or tongue breathing problems fever, infection mouth sores unusual bleeding or bruising unusually weak or tired Side effects that usually do not require medical attention (report to yourdoctor or health care professional if they continue or are bothersome): constipation or diarrhea loss of appetite nausea, vomiting This list may not describe all possible side effects. Call your doctor for medical advice about side effects. You may report side effects to FDA at1-800-FDA-1088. Where should I keep my medication? This drug is given in a hospital or clinic and will not be stored at home.  NOTE: This sheet is a summary. It may not cover all possible information. If you have questions about this medicine, talk to your doctor, pharmacist, orhealth care provider.  2022 Elsevier/Gold Standard (2007-11-22 16:50:29) Bevacizumab injection What is this medication? BEVACIZUMAB (be va SIZ yoo mab) is a monoclonal antibody. It is used to treatmany types of cancer. This medicine may be used for other purposes; ask your health care provider orpharmacist if you have questions. COMMON BRAND NAME(S): Avastin, MVASI, Noah Charon What should I tell my care team before I take this medication? They need to know if you have any of these conditions: diabetes heart disease high blood pressure history of coughing up blood prior anthracycline chemotherapy (e.g., doxorubicin, daunorubicin, epirubicin) recent or ongoing radiation therapy recent or planning to have surgery stroke an unusual or allergic  reaction to bevacizumab, hamster proteins, mouse proteins, other medicines, foods, dyes, or preservatives pregnant or trying to get pregnant breast-feeding How should I use this medication? This medicine is for infusion into a vein. It is given by a health careprofessional in a hospital or clinic setting. Talk to your pediatrician regarding the use of this medicine in children.Special care may be needed. Overdosage: If you think you have taken too much of this medicine contact apoison control center or emergency room at once. NOTE: This medicine is only for you. Do not share this medicine with others. What if I miss a dose? It is important not to miss your dose. Call your doctor or health careprofessional if you are unable to keep an appointment. What may interact with this medication? Interactions are not expected. This list may not describe all possible interactions. Give your health care provider a list of all the medicines, herbs, non-prescription drugs, or dietary supplements you use. Also tell them if you smoke, drink alcohol, or use illegaldrugs. Some items may interact with your medicine. What should I watch for while using this medication? Your condition will be monitored carefully while you are receiving this medicine. You will need important blood work and urine testing done while youare taking this medicine. This medicine may increase your risk to bruise or bleed. Call your doctor orhealth care professional if you notice any unusual bleeding. Before having surgery, talk to your health care provider to make sure it is ok. This drug can increase the risk of poor healing of your surgical site or wound. You will need to stop this drug for 28 days before surgery. After surgery, wait at least 28 days before restarting this drug. Make sure the surgical site or wound is healed enough before restarting this drug. Talk to your health careprovider if questions. Do not become pregnant while taking  this medicine or for 6 months after stopping it. Women should inform their doctor if they wish to become pregnant or think they might be pregnant. There is a potential for serious side effects to an unborn child. Talk to your health care professional or pharmacist for more information. Do not breast-feed an infant while taking this medicine andfor 6 months after the last dose. This medicine has caused ovarian failure in some women. This medicine may interfere with the ability to have a child. You should talk to your doctor orhealth care professional if you are concerned about your fertility. What side effects may I notice from receiving this medication? Side effects that you should report to your doctor or health care professionalas soon as possible: allergic reactions like skin rash, itching or hives, swelling of the face, lips,  or tongue chest pain or chest tightness chills coughing up blood high fever seizures severe constipation signs and symptoms of bleeding such as bloody or black, tarry stools; red or dark-brown urine; spitting up blood or brown material that looks like coffee grounds; red spots on the skin; unusual bruising or bleeding from the eye, gums, or nose signs and symptoms of a blood clot such as breathing problems; chest pain; severe, sudden headache; pain, swelling, warmth in the leg signs and symptoms of a stroke like changes in vision; confusion; trouble speaking or understanding; severe headaches; sudden numbness or weakness of the face, arm or leg; trouble walking; dizziness; loss of balance or coordination stomach pain sweating swelling of legs or ankles vomiting weight gain Side effects that usually do not require medical attention (report to yourdoctor or health care professional if they continue or are bothersome): back pain changes in taste decreased appetite dry skin nausea tiredness This list may not describe all possible side effects. Call your doctor for  medical advice about side effects. You may report side effects to FDA at1-800-FDA-1088. Where should I keep my medication? This drug is given in a hospital or clinic and will not be stored at home. NOTE: This sheet is a summary. It may not cover all possible information. If you have questions about this medicine, talk to your doctor, pharmacist, orhealth care provider.  2022 Elsevier/Gold Standard (2019-03-15 10:50:46)

## 2020-11-14 ENCOUNTER — Other Ambulatory Visit: Payer: Self-pay

## 2020-11-14 ENCOUNTER — Inpatient Hospital Stay: Payer: No Typology Code available for payment source

## 2020-11-14 VITALS — BP 126/85 | HR 96 | Temp 98.1°F | Resp 18 | Wt 209.0 lb

## 2020-11-14 DIAGNOSIS — Z5112 Encounter for antineoplastic immunotherapy: Secondary | ICD-10-CM | POA: Diagnosis not present

## 2020-11-14 DIAGNOSIS — Z5111 Encounter for antineoplastic chemotherapy: Secondary | ICD-10-CM | POA: Diagnosis not present

## 2020-11-14 DIAGNOSIS — C182 Malignant neoplasm of ascending colon: Secondary | ICD-10-CM

## 2020-11-14 MED ORDER — HEPARIN SOD (PORK) LOCK FLUSH 100 UNIT/ML IV SOLN
500.0000 [IU] | Freq: Once | INTRAVENOUS | Status: AC | PRN
Start: 2020-11-14 — End: 2020-11-14
  Administered 2020-11-14: 500 [IU]
  Filled 2020-11-14: qty 5

## 2020-11-14 MED ORDER — SODIUM CHLORIDE 0.9% FLUSH
10.0000 mL | INTRAVENOUS | Status: DC | PRN
  Administered 2020-11-14: 10 mL
  Filled 2020-11-14: qty 10

## 2020-11-14 NOTE — Patient Instructions (Signed)
[facility] Discharge Instructions for Patients receiving Home Portable Chemo Pump    **The bag should finish at 46 hours, 96 hours or 7 days. For example, if your pump is scheduled for 46 hours and it was put on at 4pm, it should finish at 2 pm the day it is scheduled to come off regardless of your appointment time.    Estimated time to finish   _________________________ (Have your nurse fill in)     ** if the display on your pump reads "Low Volume" and it is beeping, take the batteries out of the pump and come to the cancer center for it to be taken off.   **If the pump alarms go off prior to the pump reading "Low Volume" then call the [phone] and someone can assist you.  **If the plunger comes out and the bag fluid is running out, please use your chemo spill kit to clean up the spill. Do not use paper towels or other house hold products.  ** If you have problems or questions regarding your pump, please call either the [phone] or the cancer center Monday-Friday 8:00am-4:30pm at [phone] and we will assist you.

## 2020-11-18 ENCOUNTER — Ambulatory Visit: Payer: PRIVATE HEALTH INSURANCE | Admitting: Psychiatry

## 2020-11-19 ENCOUNTER — Encounter: Payer: Self-pay | Admitting: Oncology

## 2020-11-19 ENCOUNTER — Encounter: Payer: Self-pay | Admitting: Hematology and Oncology

## 2020-11-19 ENCOUNTER — Telehealth: Payer: Self-pay | Admitting: Oncology

## 2020-11-19 NOTE — Telephone Encounter (Signed)
Patient called to Confirmed 6/24 Follow Up - 6/23 Labs, CT Scans checking in at [facility] 12:00 pm.  Gave Instructions/Pick up Contrast

## 2020-11-19 NOTE — Progress Notes (Signed)
[facility]  [address] [place],  [place]  [address] [phone]  Clinic Day:  11/22/2020  Referring physician: Penner, Pamela, MD  This document serves as a record of services personally performed by Dequincy A Lewis, MD. It was created on their behalf by Curry,Lauren E, a trained medical scribe. The creation of this record is based on the scribe's personal observations and the provider's statements to them.  HISTORY OF PRESENT ILLNESS:  The patient is a 55 y.o. female with metastatic colon cancer, which includes spread of disease to her abdominal cavity, lungs, brain and right adrenal gland. She comes in today to go over her CT scans to ascertain her new disease baseline after receiving 8 cycles of  FOLFIRI/Avastin.  The patient claims to have toleraed her 8th cycle of treatment fairly well.  She still has diarrhea, which led to her needing 3 liters of fluid this week to prevent severe dehydration.  She still complains of mouth soreness/sensitivity, but denies having having any obvious mouth sores.  She denies having any new GI or other systemic symptoms which concern her for disease progression.   With respect to her colon cancer history, she is status post a right hemicolectomy in early October 2018, followed by 12 cycles of adjuvant chemotherapy, which were completed in April 2019.  In December 2021, she underwent a left cerebellar metastatectomy, whose pathology was consistent with metastatic colon cancer.  Tumor testing did come back MMR normal.  CT scans recently showed evidence of her cancer being in multiple locations, which has her on palliative chemotherapy at this time.    PHYSICAL EXAM:  Blood pressure 135/86, pulse (!) 106, temperature 97.8 F (36.6 C), resp. rate 16, height 5' 8" (1.727 m), weight 197 lb 6.4 oz (89.5 kg), last menstrual period 02/22/2019, SpO2 92 %. Wt Readings from Last 3 Encounters:  11/22/20 197 lb 6.4 oz (89.5 kg)   11/14/20 209 lb 0.6 oz (94.8 kg)  11/12/20 202 lb 1.9 oz (91.7 kg)   Body mass index is 30.01 kg/m. Performance status (ECOG): 1 Physical Exam Constitutional:      Appearance: Normal appearance. She is not ill-appearing.  HENT:     Mouth/Throat:     Mouth: Mucous membranes are moist.     Pharynx: Oropharynx is clear. No oropharyngeal exudate or posterior oropharyngeal erythema.  Cardiovascular:     Rate and Rhythm: Normal rate and regular rhythm.     Heart sounds: No murmur heard.   No friction rub. No gallop.  Pulmonary:     Effort: Pulmonary effort is normal. No respiratory distress.     Breath sounds: Normal breath sounds. No wheezing, rhonchi or rales.  Chest:  Breasts:    Right: No axillary adenopathy or supraclavicular adenopathy.     Left: No axillary adenopathy or supraclavicular adenopathy.  Abdominal:     General: Bowel sounds are normal. There is no distension.     Palpations: Abdomen is soft. There is no mass.     Tenderness: There is no abdominal tenderness.  Musculoskeletal:        General: No swelling.     Right lower leg: No edema.     Left lower leg: No edema.  Lymphadenopathy:     Cervical: No cervical adenopathy.     Upper Body:     Right upper body: No supraclavicular or axillary adenopathy.     Left upper body: No supraclavicular or axillary adenopathy.     Lower   Body: No right inguinal adenopathy. No left inguinal adenopathy.  Skin:    General: Skin is warm.     Coloration: Skin is not jaundiced.     Findings: No lesion or rash.  Neurological:     General: No focal deficit present.     Mental Status: She is alert and oriented to person, place, and time. Mental status is at baseline.     Cranial Nerves: Cranial nerves are intact.  Psychiatric:        Mood and Affect: Mood normal.        Behavior: Behavior normal.        Thought Content: Thought content normal.   SCANS:  Recent CT chest, abdomen and pelvis from 11/21/2020 has revealed the  following:  FINDINGS: CT CHEST FINDINGS  Cardiovascular: Accessed right chest Port-A-Cath with tip terminating at the cavoatrial junction. Normal caliber thoracic aorta and central pulmonary arteries. Normal size heart. Stable small pericardial effusion.  Mediastinum/Nodes: No supraclavicular adenopathy. No discrete thyroid nodule. No pathologically enlarged mediastinal, hilar or axillary lymph nodes. The trachea and esophagus are grossly unremarkable.  Lungs/Pleura: Mild biapical pleuroparenchymal scarring, stable. Scattered subsegmental atelectasis. No pleural effusion. No pneumothorax. No new or enlarging suspicious pulmonary nodules.  Right lung pulmonary nodules/metastases . Index lesions are as follows:  -Dominant right upper lobe pulmonary nodule measures 1.3 x 1.2 cm on image 36/4 previously 1.9 x 1.7 cm.  -Adjacent pulmonary nodule now measures 2 mm on image 36/4 previously 4 mm.  -Subpleural right lower lobe measures 1.0 x 0.9 cm on image 65/4 previously 1.1 x 0.9 cm.  Musculoskeletal: No suspicious chest wall mass. Thoracic spondylosis.  CT ABDOMEN PELVIS FINDINGS  Hepatobiliary: Diffuse hepatic steatosis. Similar size of the low attenuating area along the anterior porta now measuring 2.7 x 1.4 cm on image 53/2. Similar appearance of the peripheral wedge-shaped area of hyper attenuation/enhancement along the anterior aspect of the right lobe of the liver for instance on image 53/2 which likely represent focal fatty sparing versus perfusional anomaly. No suspicious hepatic lesions. Gallbladder is unremarkable. No biliary ductal dilation.  Pancreas: Unremarkable. No pancreatic ductal dilatation or surrounding inflammatory changes.  Spleen: Unremarkable.  Adrenals/Urinary Tract: No significant residual thickening of the RIGHT adrenal gland which is now without discrete nodularity. Left adrenal glands unremarkable.  Similar mild nonspecific perinephric  stranding. No hydronephrosis. No solid enhancing renal masses.  Bladder is unremarkable for degree of distension.  Stomach/Bowel: Stomach is distended with ingested contents without suspicious wall thickening. No pathologic dilation of small bowel. Postsurgical changes of prior right hemicolectomy.  Further decrease in size/conspicuity of the soft tissue near the anastomotic site in the right upper quadrant now measuring 1.3 x 1.2 cm on image 70/2 previously 1.5 x 1.4 cm. The other areas of peritoneal nodularity show similar decreased with the area of mesenteric nodularity previously indexed now measuring 1.1 x 0.7 cm on image 78/2 previously 1.3 x 0.8 cm.  Vascular/Lymphatic: No abdominal aortic aneurysm. No pathologically enlarged abdominal or pelvic lymph nodes.  Reproductive: Status post hysterectomy. No adnexal masses.  Other: Surgical change in the anterior abdominal wall. Small fat containing ventral hernia. No abdominopelvic ascites.  Musculoskeletal: Multilevel degenerative changes spine. No aggressive lytic or blastic lesion of bone.  IMPRESSION: 1. Overall interval decrease in disease burden, with decrease in size of the right upper lobe pulmonary nodules/metastases and stable size of the right lower lobe pulmonary nodule/metastasis. As well as continued decrease in size/conspicuity of the soft tissue near the  anastomotic site in the right upper quadrant as well as the other areas of the peritoneal nodularity. 2. No new or progressive findings in the chest, abdomen or pelvis. 3. Diffuse hepatic steatosis. Similar size of the low attenuating area along the anterior porta hepatis, previously characterized as focal fat on MRI 2021. Peripheral wedge-shaped area of hyperattenuation/enhancement along the anterior aspect of the right lobe of the liver, stable since prior and likely representing focal fatty sparing versus perfusional anomaly. Attention on follow-up imaging  suggested.  LABS:     ASSESSMENT & PLAN:  Assessment/Plan:  A 55 y.o. female with metastatic colon cancer.  In clinic today, I went over her CT scan images with her, for which she could see that her metastatic lesions are smaller in size. Although her chemotherapy does cause some side effects, they remain tolerable.  She will proceed with her 9th cycle of FOLFIRI/Avastin next week.  Clinically, she appears to be doing okay.  I will see her back in 2 weeks before she heads into her 10th cycle of  FOLFIRI/Avastin.  The patient understands all the plans discussed today and is in agreement with them.     I, Lauren Curry, am acting as scribe for Dequincy A Lewis, MD    I have reviewed this report as typed by the medical scribe, and it is complete and accurate.  Dequincy A Lewis, MD      

## 2020-11-22 ENCOUNTER — Encounter: Payer: Self-pay | Admitting: Oncology

## 2020-11-22 ENCOUNTER — Encounter: Payer: Self-pay | Admitting: Hematology and Oncology

## 2020-11-22 ENCOUNTER — Inpatient Hospital Stay: Payer: No Typology Code available for payment source

## 2020-11-22 ENCOUNTER — Inpatient Hospital Stay (INDEPENDENT_AMBULATORY_CARE_PROVIDER_SITE_OTHER): Payer: No Typology Code available for payment source | Admitting: Oncology

## 2020-11-22 ENCOUNTER — Telehealth: Payer: Self-pay | Admitting: Oncology

## 2020-11-22 ENCOUNTER — Other Ambulatory Visit: Payer: Self-pay | Admitting: Oncology

## 2020-11-22 ENCOUNTER — Other Ambulatory Visit: Payer: Self-pay

## 2020-11-22 VITALS — BP 135/86 | HR 106 | Temp 97.8°F | Resp 16 | Ht 68.0 in | Wt 197.4 lb

## 2020-11-22 VITALS — BP 133/67 | HR 104 | Resp 16 | Ht 68.0 in | Wt 197.0 lb

## 2020-11-22 DIAGNOSIS — C182 Malignant neoplasm of ascending colon: Secondary | ICD-10-CM

## 2020-11-22 DIAGNOSIS — R197 Diarrhea, unspecified: Secondary | ICD-10-CM

## 2020-11-22 DIAGNOSIS — Z5112 Encounter for antineoplastic immunotherapy: Secondary | ICD-10-CM | POA: Diagnosis not present

## 2020-11-22 DIAGNOSIS — E860 Dehydration: Secondary | ICD-10-CM

## 2020-11-22 DIAGNOSIS — Z5111 Encounter for antineoplastic chemotherapy: Secondary | ICD-10-CM | POA: Diagnosis not present

## 2020-11-22 MED ORDER — SODIUM CHLORIDE 0.9% FLUSH
10.0000 mL | Freq: Once | INTRAVENOUS | Status: AC | PRN
  Administered 2020-11-22: 10 mL
  Filled 2020-11-22: qty 10

## 2020-11-22 MED ORDER — SODIUM CHLORIDE 0.9 % IV SOLN
Freq: Once | INTRAVENOUS | Status: AC
  Filled 2020-11-22: qty 250

## 2020-11-22 MED ORDER — HEPARIN SOD (PORK) LOCK FLUSH 100 UNIT/ML IV SOLN
500.0000 [IU] | Freq: Once | INTRAVENOUS | Status: AC | PRN
  Administered 2020-11-22: 500 [IU]
  Filled 2020-11-22: qty 5

## 2020-11-22 MED FILL — Irinotecan HCl Inj 100 MG/5ML (20 MG/ML): INTRAVENOUS | Qty: 19 | Status: AC

## 2020-11-22 NOTE — Telephone Encounter (Signed)
Per 6/24 los next appt scheduled and given to patient 

## 2020-11-22 NOTE — Patient Instructions (Signed)

## 2020-11-23 ENCOUNTER — Encounter: Payer: Self-pay | Admitting: Oncology

## 2020-11-23 ENCOUNTER — Encounter: Payer: Self-pay | Admitting: Hematology and Oncology

## 2020-11-25 ENCOUNTER — Ambulatory Visit
Admission: RE | Admit: 2020-11-25 | Discharge: 2020-11-25 | Disposition: A | Discharge status: Home / Self Care | Payer: No Typology Code available for payment source | Source: Ambulatory Visit | Attending: Radiation Oncology | Admitting: Radiation Oncology

## 2020-11-25 ENCOUNTER — Encounter: Payer: Self-pay | Admitting: Oncology

## 2020-11-25 ENCOUNTER — Inpatient Hospital Stay: Payer: No Typology Code available for payment source

## 2020-11-25 ENCOUNTER — Other Ambulatory Visit: Payer: Self-pay

## 2020-11-25 DIAGNOSIS — C182 Malignant neoplasm of ascending colon: Secondary | ICD-10-CM

## 2020-11-25 DIAGNOSIS — Z5112 Encounter for antineoplastic immunotherapy: Secondary | ICD-10-CM | POA: Diagnosis not present

## 2020-11-25 DIAGNOSIS — Z5111 Encounter for antineoplastic chemotherapy: Secondary | ICD-10-CM | POA: Diagnosis not present

## 2020-11-25 DIAGNOSIS — C7931 Secondary malignant neoplasm of brain: Secondary | ICD-10-CM | POA: Insufficient documentation

## 2020-11-25 DIAGNOSIS — C7949 Secondary malignant neoplasm of other parts of nervous system: Secondary | ICD-10-CM

## 2020-11-25 MED ORDER — SODIUM CHLORIDE 0.9 % IV SOLN
150.0000 mg | Freq: Once | INTRAVENOUS | Status: AC
  Administered 2020-11-25: 150 mg via INTRAVENOUS
  Filled 2020-11-25: qty 150

## 2020-11-25 MED ORDER — IRINOTECAN HCL CHEMO INJECTION 100 MG/5ML
135.0000 mg/m2 | Freq: Once | INTRAVENOUS | Status: AC
  Administered 2020-11-25: 280 mg via INTRAVENOUS
  Filled 2020-11-25: qty 14

## 2020-11-25 MED ORDER — SODIUM CHLORIDE 0.9 % IV SOLN
5.0000 mg/kg | Freq: Once | INTRAVENOUS | Status: AC
  Administered 2020-11-25: 500 mg via INTRAVENOUS
  Filled 2020-11-25: qty 16

## 2020-11-25 MED ORDER — SODIUM CHLORIDE 0.9 % IV SOLN
Freq: Once | INTRAVENOUS | Status: AC
Start: 2020-11-25 — End: 2020-11-25
  Filled 2020-11-25: qty 250

## 2020-11-25 MED ORDER — SODIUM CHLORIDE 0.9 % IV SOLN
Freq: Once | INTRAVENOUS | Status: AC
  Filled 2020-11-25: qty 250

## 2020-11-25 MED ORDER — PALONOSETRON HCL INJECTION 0.25 MG/5ML
0.2500 mg | Freq: Once | INTRAVENOUS | Status: AC
Start: 2020-11-25 — End: 2020-11-25
  Administered 2020-11-25: 0.25 mg via INTRAVENOUS

## 2020-11-25 MED ORDER — SODIUM CHLORIDE 0.9 % IV SOLN
10.0000 mg | Freq: Once | INTRAVENOUS | Status: AC
  Administered 2020-11-25: 10 mg via INTRAVENOUS
  Filled 2020-11-25: qty 10

## 2020-11-25 MED ORDER — SODIUM CHLORIDE 0.9 % IV SOLN
2400.0000 mg/m2 | INTRAVENOUS | Status: DC
  Administered 2020-11-25: 5000 mg via INTRAVENOUS
  Filled 2020-11-25: qty 100

## 2020-11-25 MED ORDER — PALONOSETRON HCL INJECTION 0.25 MG/5ML
INTRAVENOUS | Status: AC
  Filled 2020-11-25: qty 5

## 2020-11-25 MED ORDER — ATROPINE SULFATE 1 MG/ML IJ SOLN
INTRAMUSCULAR | Status: AC
  Filled 2020-11-25: qty 1

## 2020-11-25 MED ORDER — LEUCOVORIN CALCIUM INJECTION 350 MG
400.0000 mg/m2 | Freq: Once | INTRAVENOUS | Status: AC
  Administered 2020-11-25: 836 mg via INTRAVENOUS
  Filled 2020-11-25: qty 41.8

## 2020-11-25 MED ORDER — FLUOROURACIL CHEMO INJECTION 2.5 GM/50ML
400.0000 mg/m2 | Freq: Once | INTRAVENOUS | Status: AC
  Administered 2020-11-25: 850 mg via INTRAVENOUS
  Filled 2020-11-25: qty 17

## 2020-11-25 MED ORDER — ATROPINE SULFATE 1 MG/ML IJ SOLN
0.5000 mg | Freq: Once | INTRAMUSCULAR | Status: AC | PRN
  Administered 2020-11-25: 0.5 mg via INTRAVENOUS

## 2020-11-25 NOTE — Patient Instructions (Signed)
Fluorouracil, 5-FU injection What is this medication? FLUOROURACIL, 5-FU (flure oh YOOR a sil) is a chemotherapy drug. It slows the growth of cancer cells. This medicine is used to treat many types of cancer like breast cancer, colon or rectal cancer, pancreatic cancer, and stomachcancer. This medicine may be used for other purposes; ask your health care provider orpharmacist if you have questions. COMMON BRAND NAME(S): Adrucil What should I tell my care team before I take this medication? They need to know if you have any of these conditions: blood disorders dihydropyrimidine dehydrogenase (DPD) deficiency infection (especially a virus infection such as chickenpox, cold sores, or herpes) kidney disease liver disease malnourished, poor nutrition recent or ongoing radiation therapy an unusual or allergic reaction to fluorouracil, other chemotherapy, other medicines, foods, dyes, or preservatives pregnant or trying to get pregnant breast-feeding How should I use this medication? This drug is given as an infusion or injection into a vein. It is administeredin a hospital or clinic by a specially trained health care professional. Talk to your pediatrician regarding the use of this medicine in children.Special care may be needed. Overdosage: If you think you have taken too much of this medicine contact apoison control center or emergency room at once. NOTE: This medicine is only for you. Do not share this medicine with others. What if I miss a dose? It is important not to miss your dose. Call your doctor or health careprofessional if you are unable to keep an appointment. What may interact with this medication? Do not take this medicine with any of the following medications: live virus vaccines This medicine may also interact with the following medications: medicines that treat or prevent blood clots like warfarin, enoxaparin, and dalteparin This list may not describe all possible  interactions. Give your health care provider a list of all the medicines, herbs, non-prescription drugs, or dietary supplements you use. Also tell them if you smoke, drink alcohol, or use illegaldrugs. Some items may interact with your medicine. What should I watch for while using this medication? Visit your doctor for checks on your progress. This drug may make you feel generally unwell. This is not uncommon, as chemotherapy can affect healthy cells as well as cancer cells. Report any side effects. Continue your course oftreatment even though you feel ill unless your doctor tells you to stop. In some cases, you may be given additional medicines to help with side effects.Follow all directions for their use. Call your doctor or health care professional for advice if you get a fever, chills or sore throat, or other symptoms of a cold or flu. Do not treat yourself. This drug decreases your body's ability to fight infections. Try toavoid being around people who are sick. This medicine may increase your risk to bruise or bleed. Call your doctor orhealth care professional if you notice any unusual bleeding. Be careful brushing and flossing your teeth or using a toothpick because you may get an infection or bleed more easily. If you have any dental work done,tell your dentist you are receiving this medicine. Avoid taking products that contain aspirin, acetaminophen, ibuprofen, naproxen, or ketoprofen unless instructed by your doctor. These medicines may hide afever. Do not become pregnant while taking this medicine. Women should inform their doctor if they wish to become pregnant or think they might be pregnant. There is a potential for serious side effects to an unborn child. Talk to your health care professional or pharmacist for more information. Do not breast-feed aninfant while taking this   medicine. Men should inform their doctor if they wish to father a child. This medicinemay lower sperm counts. Do not  treat diarrhea with over the counter products. Contact your doctor ifyou have diarrhea that lasts more than 2 days or if it is severe and watery. This medicine can make you more sensitive to the sun. Keep out of the sun. If you cannot avoid being in the sun, wear protective clothing and use sunscreen.Do not use sun lamps or tanning beds/booths. What side effects may I notice from receiving this medication? Side effects that you should report to your doctor or health care professionalas soon as possible: allergic reactions like skin rash, itching or hives, swelling of the face, lips, or tongue low blood counts - this medicine may decrease the number of white blood cells, red blood cells and platelets. You may be at increased risk for infections and bleeding. signs of infection - fever or chills, cough, sore throat, pain or difficulty passing urine signs of decreased platelets or bleeding - bruising, pinpoint red spots on the skin, black, tarry stools, blood in the urine signs of decreased red blood cells - unusually weak or tired, fainting spells, lightheadedness breathing problems changes in vision chest pain mouth sores nausea and vomiting pain, swelling, redness at site where injected pain, tingling, numbness in the hands or feet redness, swelling, or sores on hands or feet stomach pain unusual bleeding Side effects that usually do not require medical attention (report to yourdoctor or health care professional if they continue or are bothersome): changes in finger or toe nails diarrhea dry or itchy skin hair loss headache loss of appetite sensitivity of eyes to the light stomach upset unusually teary eyes This list may not describe all possible side effects. Call your doctor for medical advice about side effects. You may report side effects to FDA at1-800-FDA-1088. Where should I keep my medication? This drug is given in a hospital or clinic and will not be stored at home. NOTE:  This sheet is a summary. It may not cover all possible information. If you have questions about this medicine, talk to your doctor, pharmacist, orhealth care provider.  2022 [employer] (2019-04-18 15:00:03) Leucovorin injection What is this medication? LEUCOVORIN (loo koe VOR in) is used to prevent or treat the harmful effects of some medicines. This medicine is used to treat anemia caused by a low amount of folic acid in the body. It is also used with 5-fluorouracil (5-FU) to treatcolon cancer. This medicine may be used for other purposes; ask your health care provider orpharmacist if you have questions. What should I tell my care team before I take this medication? They need to know if you have any of these conditions: anemia from low levels of vitamin B-12 in the blood an unusual or allergic reaction to leucovorin, folic acid, other medicines, foods, dyes, or preservatives pregnant or trying to get pregnant breast-feeding How should I use this medication? This medicine is for injection into a muscle or into a vein. It is given by ahealth care professional in a hospital or clinic setting. Talk to your pediatrician regarding the use of this medicine in children.Special care may be needed. Overdosage: If you think you have taken too much of this medicine contact apoison control center or emergency room at once. NOTE: This medicine is only for you. Do not share this medicine with others. What if I miss a dose? This does not apply. What may interact with this medication? capecitabine fluorouracil phenobarbital  phenytoin primidone trimethoprim-sulfamethoxazole This list may not describe all possible interactions. Give your health care provider a list of all the medicines, herbs, non-prescription drugs, or dietary supplements you use. Also tell them if you smoke, drink alcohol, or use illegaldrugs. Some items may interact with your medicine. What should I watch for while using  this medication? Your condition will be monitored carefully while you are receiving thismedicine. This medicine may increase the side effects of 5-fluorouracil, 5-FU. Tell your doctor or health care professional if you have diarrhea or mouth sores that donot get better or that get worse. What side effects may I notice from receiving this medication? Side effects that you should report to your doctor or health care professionalas soon as possible: allergic reactions like skin rash, itching or hives, swelling of the face, lips, or tongue breathing problems fever, infection mouth sores unusual bleeding or bruising unusually weak or tired Side effects that usually do not require medical attention (report to yourdoctor or health care professional if they continue or are bothersome): constipation or diarrhea loss of appetite nausea, vomiting This list may not describe all possible side effects. Call your doctor for medical advice about side effects. You may report side effects to FDA at1-800-FDA-1088. Where should I keep my medication? This drug is given in a hospital or clinic and will not be stored at home. NOTE: This sheet is a summary. It may not cover all possible information. If you have questions about this medicine, talk to your doctor, pharmacist, orhealth care provider.  2022 [employer] (2007-11-22 16:50:29) Irinotecan injection What is this medication? IRINOTECAN (ir in oh TEE kan ) is a chemotherapy drug. It is used to treatcolon and rectal cancer. This medicine may be used for other purposes; ask your health care provider orpharmacist if you have questions. COMMON BRAND NAME(S): Camptosar What should I tell my care team before I take this medication? They need to know if you have any of these conditions: dehydration diarrhea infection (especially a virus infection such as chickenpox, cold sores, or herpes) liver disease low blood counts, like low white cell,  platelet, or red cell counts low levels of calcium, magnesium, or potassium in the blood recent or ongoing radiation therapy an unusual or allergic reaction to irinotecan, other medicines, foods, dyes, or preservatives pregnant or trying to get pregnant breast-feeding How should I use this medication? This drug is given as an infusion into a vein. It is administered in a hospitalor clinic by a specially trained health care professional. Talk to your pediatrician regarding the use of this medicine in children.Special care may be needed. Overdosage: If you think you have taken too much of this medicine contact apoison control center or emergency room at once. NOTE: This medicine is only for you. Do not share this medicine with others. What if I miss a dose? It is important not to miss your dose. Call your doctor or health careprofessional if you are unable to keep an appointment. What may interact with this medication? Do not take this medicine with any of the following medications: cobicistat itraconazole This medicine may interact with the following medications: antiviral medicines for HIV or AIDS certain antibiotics like rifampin or rifabutin certain medicines for fungal infections like ketoconazole, posaconazole, and voriconazole certain medicines for seizures like carbamazepine, phenobarbital, phenotoin clarithromycin gemfibrozil nefazodone [facility]. John's Wort This list may not describe all possible interactions. Give your health care provider a list of all the medicines, herbs, non-prescription drugs, or dietary supplements you  use. Also tell them if you smoke, drink alcohol, or use illegaldrugs. Some items may interact with your medicine. What should I watch for while using this medication? Your condition will be monitored carefully while you are receiving this medicine. You will need important blood work done while you are taking thismedicine. This drug may make you feel generally  unwell. This is not uncommon, as chemotherapy can affect healthy cells as well as cancer cells. Report any side effects. Continue your course of treatment even though you feel ill unless yourdoctor tells you to stop. In some cases, you may be given additional medicines to help with side effects.Follow all directions for their use. You may get drowsy or dizzy. Do not drive, use machinery, or do anything that needs mental alertness until you know how this medicine affects you. Do not stand or sit up quickly, especially if you are an older patient. This reducesthe risk of dizzy or fainting spells. Call your health care professional for advice if you get a fever, chills, or sore throat, or other symptoms of a cold or flu. Do not treat yourself. This medicine decreases your body's ability to fight infections. Try to avoid beingaround people who are sick. Avoid taking products that contain aspirin, acetaminophen, ibuprofen, naproxen, or ketoprofen unless instructed by your doctor. These medicines may hide afever. This medicine may increase your risk to bruise or bleed. Call your doctor orhealth care professional if you notice any unusual bleeding. Be careful brushing and flossing your teeth or using a toothpick because you may get an infection or bleed more easily. If you have any dental work done,tell your dentist you are receiving this medicine. Do not become pregnant while taking this medicine or for 6 months after stopping it. Women should inform their health care professional if they wish to become pregnant or think they might be pregnant. Men should not father a child while taking this medicine and for 3 months after stopping it. There is potential for serious side effects to an unborn child. Talk to your health careprofessional for more information. Do not breast-feed an infant while taking this medicine or for 7 days afterstopping it. This medicine has caused ovarian failure in some women. This medicine  may make it more difficult to get pregnant. Talk to your health care professional if [place] Sellers concerned about your fertility. This medicine has caused decreased sperm counts in some men. This may make it more difficult to father a child. Talk to your health care professional if [place] S[place] concerned about your fertility. What side effects may I notice from receiving this medication? Side effects that you should report to your doctor or health care professionalas soon as possible: allergic reactions like skin rash, itching or hives, swelling of the face, lips, or tongue chest pain diarrhea flushing, runny nose, sweating during infusion low blood counts - this medicine may decrease the number of white blood cells, red blood cells and platelets. You may be at increased risk for infections and bleeding. nausea, vomiting pain, swelling, warmth in the leg signs of decreased platelets or bleeding - bruising, pinpoint red spots on the skin, black, tarry stools, blood in the urine signs of infection - fever or chills, cough, sore throat, pain or difficulty passing urine signs of decreased red blood cells - unusually weak or tired, fainting spells, lightheadedness Side effects that usually do not require medical attention (report to yourdoctor or health care professional if they continue or are bothersome): constipation hair loss headache loss of  appetite mouth sores stomach pain This list may not describe all possible side effects. Call your doctor for medical advice about side effects. You may report side effects to FDA at1-800-FDA-1088. Where should I keep my medication? This drug is given in a hospital or clinic and will not be stored at home. NOTE: This sheet is a summary. It may not cover all possible information. If you have questions about this medicine, talk to your doctor, pharmacist, orhealth care provider.  2022 [employer] (2019-04-18 17:46:13)

## 2020-11-26 ENCOUNTER — Encounter: Payer: Self-pay | Admitting: Psychiatry

## 2020-11-26 ENCOUNTER — Encounter: Payer: Self-pay | Admitting: Oncology

## 2020-11-26 ENCOUNTER — Encounter: Payer: Self-pay | Admitting: Hematology and Oncology

## 2020-11-26 ENCOUNTER — Ambulatory Visit (INDEPENDENT_AMBULATORY_CARE_PROVIDER_SITE_OTHER): Payer: No Typology Code available for payment source | Admitting: Psychiatry

## 2020-11-26 DIAGNOSIS — G3184 Mild cognitive impairment, so stated: Secondary | ICD-10-CM | POA: Diagnosis not present

## 2020-11-26 DIAGNOSIS — F333 Major depressive disorder, recurrent, severe with psychotic symptoms: Secondary | ICD-10-CM | POA: Diagnosis not present

## 2020-11-26 DIAGNOSIS — G4721 Circadian rhythm sleep disorder, delayed sleep phase type: Secondary | ICD-10-CM | POA: Diagnosis not present

## 2020-11-26 DIAGNOSIS — F422 Mixed obsessional thoughts and acts: Secondary | ICD-10-CM | POA: Diagnosis not present

## 2020-11-26 DIAGNOSIS — R530 Neoplastic (malignant) related fatigue: Secondary | ICD-10-CM

## 2020-11-26 MED ORDER — MODAFINIL 200 MG PO TABS: 300.0000 mg | ORAL_TABLET | Freq: Every day | ORAL | 1 refills | Status: DC

## 2020-11-26 NOTE — Progress Notes (Signed)
[name] [id number] 1965/09/26 55 y.o.    Subjective:   Patient ID:  Shannon [name] is a 55 y.o. (DOB [birth year]) female.  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  Last seen November 2020 .  No meds were changed at that time.  10/16/19 the following is noted at this appt: Asked questions about sleep supplement.   Just started GI doctor.   Good overall. No mood swings. No depressive episodes.  Cry more with hormonal changes more easily.   Probably sleep too much to avoid.  Occ stress from work interferes with sleep.  Not as productive from home.  Hopes to get back to the office.  Still working from home.  Boss noticed her forgetfulness with deadlines and missing issues in emails.  Used to be sharper. Wonders if related to meds.  Can usually nap if desired for years and not seasonal.  Only anxiety is Covid related. More poor self care.   Hysterectomy December 2020. No med changes  03/18/2020 appointment with the following noted: July had Covid and both parents and her father died from Shannon [name]. Able to help mom with arrangements. Required one iron infusion this year. Never took NAC bc it was too hard to take. Sleep, eating schedule is irregular. More productive at home helps her feel better. Plan: No med changes. 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 For a month had dizzy, falls, NV all of which cleared quickly after removal.  Radiation to brain after this. More CA in lungs and abdomen and having chemo.  Oncologist said she has about 5 years of lifespan left.  MRI and CT pending. Gotten into therapy  starting [name] next week.   Pray a lot and good connection with friends.  Good support.  Trying to do things for fun.   Is working but finding it difficult mentally and physically DT SE fatigue from chemo. Considering LT disability but $ concerns.   If anxious trouble falling asleep.   Works from home.   D 55 yo.   Plan: No med changes except added modafinil  11/26/2020 appointment with the following noted: Added modafinil and didn't notice much from it. Sleep average about 7 hours.  Would prefer 10 hours and may nap here and there in week of chemo even on chemo.  Had to get it at [facility] with [facility]. No dx of OSA. Last 2 weeks a little low emotionally and mentally after up for a couple of weeks with company and it wore her out. Sleepiness is normally worse than tiredness.  Only situational mood effects. Patient reports stable mood and denies depressed or irritable moods. Some anxiety over it.  Patient denies difficulty with sleep maintenance.   Denies appetite disturbance.  Patient reports that energy and motivation have been good.  Patient denies any difficulty with concentration.  Patient denies any suicidal ideation. Anxious, obsessive thoughts minimal.  Past Psychiatric Medication Trials: HX RELAPSE PSYCHOSIS WITH GENERIC QUETIAPINE,  Citalopram, fluoxetine 80, sertraline to 50, inVega 3, Abilify 15, lamotrigine 200, Seroquel 800, Risperdal 2 Has been under the care of the psychiatrist since March  2000  Review of Systems:  Review of Systems  Medications: I have reviewed the patient's current medications.  Current Outpatient Medications  Medication Sig Dispense Refill   acetaminophen (TYLENOL) 325 MG tablet Take 2 tablets (650 mg total) by mouth every 6 (six) hours as needed for mild pain.     atorvastatin (LIPITOR) 10 MG tablet Take 1 tablet (10 mg total) by mouth at bedtime. 30 tablet 0   CONTOUR NEXT TEST test strip 3 (three) times daily.     Dulaglutide (TRULICITY)  1.5 PN/3.6RW SOPN Inject into the skin.     fluvoxaMINE (LUVOX) 100 MG tablet Take 3 tablets (300 mg total) by mouth at bedtime. 270 tablet 1   hyoscyamine (LEVSIN SL) 0.125 MG SL tablet Place 1 tablet (0.125 mg total) under the tongue every 6 (six) hours as needed. 45 tablet 1   insulin aspart (NOVOLOG FLEXPEN) 100 UNIT/ML FlexPen Inject 8 Units into the skin 3 (three) times daily with meals. 15 mL 11   insulin glargine (LANTUS) 100 UNIT/ML Solostar Pen Inject 26 Units into the skin daily. 15 mL 11   lamoTRIgine (LAMICTAL) 150 MG tablet Take 1 tablet (150 mg total) by mouth daily. 90 tablet 3   levothyroxine (SYNTHROID) 75 MCG tablet Take 1 tablet (75 mcg total) by mouth daily. 30 tablet 0   loratadine (CLARITIN) 10 MG tablet Take 10 mg by mouth daily.     metFORMIN (GLUCOPHAGE) 500 MG tablet Take 1 tablet (500 mg total) by mouth 2 (two) times daily with a meal. 60 tablet 0   modafinil (PROVIGIL) 200 MG tablet 1/2 tablet each morning for 1 week, then 1 each AM 30 tablet 1   NOVOFINE PEN NEEDLE 32G X 6 MM MISC SMARTSIG:Syringe(s) SUB-Q     ondansetron (ZOFRAN) 4 MG tablet Take 1 tablet (4 mg total) by mouth every 4 (four) hours as needed for nausea. 90 tablet 3   pantoprazole (PROTONIX) 40 MG tablet TAKE 1 TABLET BY MOUTH EVERYDAY AT BEDTIME 30 tablet 0   prochlorperazine (COMPAZINE) 10 MG tablet TAKE 1 TABLET BY MOUTH EVERY 6 HOURS AS NEEDED FOR NAUSEA OR VOMITING. 90 tablet 3   QUEtiapine (SEROQUEL) 400 MG tablet Take 2 tablets (800 mg total) by mouth at bedtime. 30 tablet 0   senna-docusate (SENOKOT-S) 8.6-50 MG tablet Take 2 tablets by mouth at bedtime.     zolpidem (AMBIEN) 5 MG tablet Take 1 tablet (5 mg total) by mouth at bedtime as needed for sleep. 30 tablet 0   No current facility-administered medications for this visit.    Medication Side Effects: None  Allergies:  Allergies  Allergen Reactions   Clindamycin/Lincomycin Rash   Penicillins Rash    Reaction: 10 years    Past  Medical History:  Diagnosis Date   Anemia    Iron De   Anxiety    Blood clot in vein    Bowel obstruction ([place])    Colon cancer ([place]) 2018   treated with surgery and chemotherapy   Depression    Diabetes mellitus without complication ([place])    Type II   DVT (deep venous thrombosis) ([place]) 2019   behind right knee- while she wasa on chemo   GERD (gastroesophageal reflux disease)    History of blood transfusion    History of chemotherapy    History of kidney stones 2012   passed   Hypothyroidism    Obesity     Family History  Problem Relation Age of Onset  Irritable bowel syndrome Mother    Colon polyps Father    Breast cancer Maternal Grandmother    Diabetes Maternal Grandmother    Diabetes Maternal Grandfather    Breast cancer Paternal Grandmother    Colon polyps Maternal Uncle    Stomach cancer Neg Hx    Pancreatic cancer Neg Hx    Esophageal cancer Neg Hx    Colon cancer Neg Hx     Social History   Socioeconomic History   Marital status: Widowed    Spouse name: Not on file   Number of children: Not on file   Years of education: Not on file   Highest education level: Not on file  Occupational History   Occupation: care coordinator   Tobacco Use   Smoking status: Never   Smokeless tobacco: Never  Vaping Use   Vaping Use: Never used  Substance and Sexual Activity   Alcohol use: Yes    Comment: rare   Drug use: Never   Sexual activity: Not Currently  Other Topics Concern   Not on file  Social History Narrative   Not on file   Social Determinants of Health   Financial Resource Strain: Not on file  Food Insecurity: Not on file  Transportation Needs: Not on file  Physical Activity: Not on file  Stress: Not on file  Social Connections: Not on file  Intimate Partner Violence: Not on file    Past Medical History, Surgical history, Social history, and Family history were reviewed and updated as appropriate.   Please see review of systems for further  details on the patient's review from today.   Objective:   Physical Exam:  LMP 02/22/2019 (Approximate)   Physical Exam  Lab Review:     Component Value Date/Time   NA 135 (A) 11/07/2020 0000   K 4.0 11/07/2020 0000   CL 105 11/07/2020 0000   CO2 20 11/07/2020 0000   GLUCOSE 234 (H) 06/07/2020 0525   BUN 16 11/07/2020 0000   CREATININE 1.1 11/07/2020 0000   CREATININE 0.97 06/07/2020 0525   CALCIUM 8.2 (A) 11/07/2020 0000   PROT 5.2 (L) 06/07/2020 0525   ALBUMIN 4.0 11/07/2020 0000   AST 30 11/07/2020 0000   ALT 31 11/07/2020 0000   ALKPHOS 132 (A) 11/07/2020 0000   BILITOT 0.3 06/07/2020 0525   GFRNONAA >60 06/07/2020 0525       Component Value Date/Time   WBC 3.6 11/11/2020 0000   WBC 4.8 06/07/2020 0525   RBC 3.84 (A) 11/11/2020 0000   HGB 12.3 11/11/2020 0000   HCT 38 11/11/2020 0000   PLT 118 (A) 11/11/2020 0000   MCV 98 09/30/2020 0000   MCH 32.6 09/16/2020 0000   MCHC 33 09/16/2020 0000   RDW 13.5 06/07/2020 0525   LYMPHSABS 1.0 06/07/2020 0525   MONOABS 0.4 06/07/2020 0525   EOSABS 0.0 06/07/2020 0525   BASOSABS 0.0 06/07/2020 0525    No results found for: POCLITH, LITHIUM   No results found for: PHENYTOIN, PHENOBARB, VALPROATE, CBMZ   .res Assessment: Plan:    Severe recurrent major depression with psychotic features ([place])  Mild cognitive impairment  Mixed obsessional thoughts and acts  Delayed sleep phase syndrome  Neoplastic malignant related fatigue   Overall mood and anxiety are managed.  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 In general has handled this well except some trouble going to sleep on occ with anxiety.  Try to  avoid Xanax if possible BC DDI with Luvox. Ambien 5 prn.  Disc amnesia risk.  No benefit 200 mg modafinil nor SE therefore Increase Modafinil to 300 mg daily for excessive sleepiness  Discussed potential metabolic side effects associated with atypical  antipsychotics, as well as potential risk for movement side effects. Advised pt to contact office if movement side effects occur.   Counseled patient regarding potential benefits, risks, and side effects of Lamictal to include potential risk of Stevens-Johnson syndrome. Advised patient to stop taking Lamictal and contact office immediately if rash develops and to seek urgent medical attention if rash is severe and/or spreading quickly.  She's benefitting from meds and is satisfied.  Continue current meds. HX RELAPSE PSYCHOSIS WITH GENERIC QUETIAPINE, Continue branded Seroquel 800 mg HS and lamotrigine 150 mg daily and Luvox 300 mg daily  FU 2-3 mos  Lynder Parents, MD, DFAPA   Please see After Visit Summary for patient specific instructions.  Future Appointments  Date Time Provider [place]  11/27/2020  3:00 PM [facility] INFUSION CHAIR 3 [facility] None  12/06/2020  1:30 PM [facility]-LAB [facility] None  12/06/2020  2:00 PM Lewis, Dequincy A, MD [facility] None  12/10/2020 11:00 AM [facility] BED 1 [facility] None  12/12/2020  3:00 PM [facility] INFUSION CHAIR 7 [facility] None  12/20/2020  1:00 PM [facility]LAB [facility] None  12/20/2020  1:30 PM Lewis, Dequincy A, MD [facility] None  12/23/2020 10:00 AM [facility] INFUSION CHAIR 4 [facility] None  12/25/2020  3:00 PM [facility] INFUSION CHAIR 5 [facility] None  01/10/2021  1:00 PM GI-315 MR 3 GI-315MRI GI-[facility] [address]  01/13/2021  7:00 AM [facility]TUMOR BOARD CONFERENCE [facility] None  01/16/2021 10:00 AM Vaslow, Acey Lav, MD [facility] None  01/30/2021  8:00 AM Edgardo Roys, PsyD [facility] CPR  02/18/2021  8:00 AM Edgardo Roys, PsyD [facility] CPR  02/25/2021  8:00 AM Edgardo Roys, PsyD [facility] CPR    No orders of the defined types were placed in this encounter.     -------------------------------

## 2020-11-27 ENCOUNTER — Other Ambulatory Visit: Payer: Self-pay

## 2020-11-27 ENCOUNTER — Inpatient Hospital Stay: Payer: No Typology Code available for payment source

## 2020-11-27 VITALS — BP 143/74 | HR 102 | Temp 98.1°F | Resp 18 | Ht 68.0 in | Wt 201.1 lb

## 2020-11-27 DIAGNOSIS — C182 Malignant neoplasm of ascending colon: Secondary | ICD-10-CM

## 2020-11-27 DIAGNOSIS — Z5112 Encounter for antineoplastic immunotherapy: Secondary | ICD-10-CM | POA: Diagnosis not present

## 2020-11-27 DIAGNOSIS — Z5111 Encounter for antineoplastic chemotherapy: Secondary | ICD-10-CM | POA: Diagnosis not present

## 2020-11-27 MED ORDER — HEPARIN SOD (PORK) LOCK FLUSH 100 UNIT/ML IV SOLN
500.0000 [IU] | Freq: Once | INTRAVENOUS | Status: AC | PRN
  Administered 2020-11-27: 500 [IU]
  Filled 2020-11-27: qty 5

## 2020-11-27 MED ORDER — SODIUM CHLORIDE 0.9% FLUSH
10.0000 mL | INTRAVENOUS | Status: DC | PRN
Start: 2020-11-27 — End: 2020-11-27
  Administered 2020-11-27: 10 mL
  Filled 2020-11-27: qty 10

## 2020-11-27 NOTE — Patient Instructions (Signed)
[facility] Discharge Instructions for Patients receiving Home Portable Chemo Pump    **The bag should finish at 46 hours, 96 hours or 7 days. For example, if your pump is scheduled for 46 hours and it was put on at 4pm, it should finish at 2 pm the day it is scheduled to come off regardless of your appointment time.    Estimated time to finish   _________________________ (Have your nurse fill in)     ** if the display on your pump reads "Low Volume" and it is beeping, take the batteries out of the pump and come to the cancer center for it to be taken off.   **If the pump alarms go off prior to the pump reading "Low Volume" then call the [phone] and someone can assist you.  **If the plunger comes out and the bag fluid is running out, please use your chemo spill kit to clean up the spill. Do not use paper towels or other house hold products.  ** If you have problems or questions regarding your pump, please call either the [phone] or the cancer center Monday-Friday 8:00am-4:30pm at [phone] and we will assist you.

## 2020-11-28 NOTE — Progress Notes (Addendum)
[place]  [address] [place],    [address] [phone]  Clinic Day:  12/06/2020  Referring physician: Greig Right, MD  This document serves as a record of services personally performed by Dequincy Macarthur Critchley, MD. It was created on their behalf by [facility] E, a trained medical scribe. The creation of this record is based on the scribe's personal observations and the provider's statements to them.  HISTORY OF PRESENT ILLNESS:  The patient is a 55 y.o. female with metastatic colon cancer, which includes spread of disease to her abdominal cavity, lungs, brain and right adrenal gland. She comes in today to be evaluated before heading into her 10th cycle of FOLFIRI/Avastin.  The patient claims to have toleraed her 9th cycle of treatment fairly well.  She has had slightly more diarrhea, which led to her needing 3 liters of fluid this week to prevent severe dehydration.  She still complains of mouth soreness/sensitivity, but denies having having any obvious mouth sores.  She denies having any new GI or other systemic symptoms which concern her for disease progression.   With respect to her colon cancer history, she is status post a right hemicolectomy in early October 2018, followed by 12 cycles of adjuvant chemotherapy, which were completed in April 2019.  In December 2021, she underwent a left cerebellar metastatectomy, whose pathology was consistent with metastatic colon cancer.  Tumor testing did come back MMR normal.  CT scans recently showed evidence of her cancer being in multiple locations, which has her on palliative chemotherapy at this time.    PHYSICAL EXAM:  Last menstrual period 02/22/2019. Wt Readings from Last 3 Encounters:  11/27/20 201 lb 1.3 oz (91.2 kg)  11/22/20 197 lb (89.4 kg)  11/22/20 197 lb 6.4 oz (89.5 kg)   There is no height or weight on file to calculate BMI. Performance status (ECOG): 1 Physical Exam Constitutional:       Appearance: Normal appearance. She is not ill-appearing.  HENT:     Mouth/Throat:     Mouth: Mucous membranes are moist.     Pharynx: Oropharynx is clear. No oropharyngeal exudate or posterior oropharyngeal erythema.  Cardiovascular:     Rate and Rhythm: Normal rate and regular rhythm.     Heart sounds: No murmur heard.   No friction rub. No gallop.  Pulmonary:     Effort: Pulmonary effort is normal. No respiratory distress.     Breath sounds: Normal breath sounds. No wheezing, rhonchi or rales.  Chest:  Breasts:    Right: No axillary adenopathy or supraclavicular adenopathy.     Left: No axillary adenopathy or supraclavicular adenopathy.  Abdominal:     General: Bowel sounds are normal. There is no distension.     Palpations: Abdomen is soft. There is no mass.     Tenderness: no abdominal tenderness  Musculoskeletal:        General: No swelling.     Right lower leg: No edema.     Left lower leg: No edema.  Lymphadenopathy:     Cervical: No cervical adenopathy.     Upper Body:     Right upper body: No supraclavicular or axillary adenopathy.     Left upper body: No supraclavicular or axillary adenopathy.     Lower Body: No right inguinal adenopathy. No left inguinal adenopathy.  Skin:    General: Skin is warm.     Coloration: Skin is not jaundiced.     Findings: No lesion  or rash.  Neurological:     General: No focal deficit present.     Mental Status: She is alert and oriented to person, place, and time. Mental status is at baseline.     Cranial Nerves: Cranial nerves are intact.  Psychiatric:        Mood and Affect: Mood normal.        Behavior: Behavior normal.        Thought Content: Thought content normal.   LABS:    Ref. Range 12/06/2020 00:00  Sodium Latest Ref Range: 137 - 147  137  Potassium Latest Ref Range: 3.4 - 5.3  3.9  Chloride Latest Ref Range: 99 - 108  105  CO2 Latest Ref Range: 13 - 22  19  Glucose Unknown 277  BUN Latest Ref Range: 4 - 21  13   Creatinine Latest Ref Range: 0.5 - 1.1  1.2 (A)  Calcium Latest Ref Range: 8.7 - 10.7  8.8  Alkaline Phosphatase Latest Ref Range: 25 - 125  124  Albumin Latest Ref Range: 3.5 - 5.0  4.3  AST Latest Ref Range: 13 - 35  33  ALT Latest Ref Range: 7 - 35  38 (A)  Bilirubin, Total Unknown 0.2  WBC Unknown 2.9  RBC Latest Ref Range: 3.87 - 5.11  3.53 (A)  Hemoglobin Latest Ref Range: 12.0 - 16.0  11.7 (A)  HCT Latest Ref Range: 36 - 46  35 (A)  MCV Latest Ref Range: 81 - 99  99  Platelets Latest Ref Range: 150 - 399  161  NEUT# Unknown 1.36    ASSESSMENT & PLAN:  Assessment/Plan:  A 55 y.o. female with metastatic colon cancer.  She will proceed with her 10th cycle of FOLFIRI/Avastin next week.  As she has had more problems with diarrhea, her irinotecan dose will be decreased by 25%.  Furthermore, she will receive Zarxio to prevent severe neutropenia from delaying successive cycles of treatment.  I also stressed the importance of using Imodium to prevent diarrhea and dehydration from becoming problems.  Otherwise, I will see her back in 2 weeks before she heads into her 11th cycle of  FOLFIRI/Avastin.  The patient understands all the plans discussed today and is in agreement with them.     I, Rita Ohara, am acting as scribe for Marice Potter, MD    I have reviewed this report as typed by the medical scribe, and it is complete and accurate.  Dequincy Macarthur Critchley, MD

## 2020-11-30 ENCOUNTER — Encounter: Payer: Self-pay | Admitting: Oncology

## 2020-11-30 ENCOUNTER — Encounter: Payer: Self-pay | Admitting: Hematology and Oncology

## 2020-12-04 ENCOUNTER — Telehealth: Payer: Self-pay | Admitting: Radiation Therapy

## 2020-12-04 NOTE — Telephone Encounter (Signed)
Left a detailed message requesting a call back. This is a follow-up call to see how she is doing after completing radiosurgery to the 9 mm Rt Cerebellar metastases on 10/22/2020.   The message included upcoming brain MRI and follow-up with Dr. Mickeal Skinner appointment details along with my contact information to call back and let me know how she is doing. Ms. Pol is currently receiving systemic treatment in [place] and has an appointment on Friday 7/8 with Dr. Bobby Rumpf.  Mont Dutton R.T.(R)(T) Radiation Special Procedures Navigator

## 2020-12-06 ENCOUNTER — Inpatient Hospital Stay: Payer: PRIVATE HEALTH INSURANCE

## 2020-12-06 ENCOUNTER — Other Ambulatory Visit: Payer: Self-pay | Admitting: Oncology

## 2020-12-06 ENCOUNTER — Inpatient Hospital Stay: Payer: PRIVATE HEALTH INSURANCE | Attending: Oncology | Admitting: Oncology

## 2020-12-06 ENCOUNTER — Encounter: Payer: Self-pay | Admitting: Oncology

## 2020-12-06 ENCOUNTER — Other Ambulatory Visit: Payer: Self-pay | Admitting: Hematology and Oncology

## 2020-12-06 ENCOUNTER — Other Ambulatory Visit: Payer: Self-pay

## 2020-12-06 VITALS — BP 131/83 | HR 96 | Temp 97.9°F | Resp 18 | Ht 68.0 in | Wt 201.2 lb

## 2020-12-06 DIAGNOSIS — C7800 Secondary malignant neoplasm of unspecified lung: Secondary | ICD-10-CM | POA: Insufficient documentation

## 2020-12-06 DIAGNOSIS — Z79899 Other long term (current) drug therapy: Secondary | ICD-10-CM | POA: Insufficient documentation

## 2020-12-06 DIAGNOSIS — C182 Malignant neoplasm of ascending colon: Secondary | ICD-10-CM

## 2020-12-06 DIAGNOSIS — C189 Malignant neoplasm of colon, unspecified: Secondary | ICD-10-CM | POA: Insufficient documentation

## 2020-12-06 DIAGNOSIS — C7989 Secondary malignant neoplasm of other specified sites: Secondary | ICD-10-CM | POA: Insufficient documentation

## 2020-12-06 DIAGNOSIS — C7931 Secondary malignant neoplasm of brain: Secondary | ICD-10-CM | POA: Insufficient documentation

## 2020-12-06 DIAGNOSIS — Z5189 Encounter for other specified aftercare: Secondary | ICD-10-CM | POA: Insufficient documentation

## 2020-12-06 DIAGNOSIS — Z5112 Encounter for antineoplastic immunotherapy: Secondary | ICD-10-CM | POA: Insufficient documentation

## 2020-12-06 DIAGNOSIS — Z5111 Encounter for antineoplastic chemotherapy: Secondary | ICD-10-CM | POA: Insufficient documentation

## 2020-12-06 DIAGNOSIS — C7971 Secondary malignant neoplasm of right adrenal gland: Secondary | ICD-10-CM | POA: Insufficient documentation

## 2020-12-06 LAB — COMPREHENSIVE METABOLIC PANEL
Albumin: 4.3 (ref 3.5–5.0)
Calcium: 8.8 (ref 8.7–10.7)

## 2020-12-06 LAB — HEPATIC FUNCTION PANEL
ALT: 38 — AB (ref 7–35)
AST: 33 (ref 13–35)
Alkaline Phosphatase: 124 (ref 25–125)
Bilirubin, Total: 0.2

## 2020-12-06 LAB — BASIC METABOLIC PANEL
BUN: 13 (ref 4–21)
CO2: 19 (ref 13–22)
Chloride: 105 (ref 99–108)
Creatinine: 1.2 — AB (ref 0.5–1.1)
Glucose: 277
Potassium: 3.9 (ref 3.4–5.3)
Sodium: 137 (ref 137–147)

## 2020-12-06 LAB — CBC AND DIFFERENTIAL
HCT: 35 — AB (ref 36–46)
Hemoglobin: 11.7 — AB (ref 12.0–16.0)
Neutrophils Absolute: 1.36
Platelets: 161 (ref 150–399)
WBC: 2.9

## 2020-12-06 LAB — CBC
MCV: 99 (ref 81–99)
RBC: 3.53 — AB (ref 3.87–5.11)

## 2020-12-10 ENCOUNTER — Other Ambulatory Visit: Payer: Self-pay

## 2020-12-10 ENCOUNTER — Inpatient Hospital Stay: Payer: PRIVATE HEALTH INSURANCE

## 2020-12-10 ENCOUNTER — Encounter: Payer: Self-pay | Admitting: Oncology

## 2020-12-10 ENCOUNTER — Other Ambulatory Visit: Payer: No Typology Code available for payment source

## 2020-12-10 ENCOUNTER — Other Ambulatory Visit: Payer: Self-pay | Admitting: Hematology and Oncology

## 2020-12-10 DIAGNOSIS — C182 Malignant neoplasm of ascending colon: Secondary | ICD-10-CM

## 2020-12-10 DIAGNOSIS — Z5112 Encounter for antineoplastic immunotherapy: Secondary | ICD-10-CM | POA: Diagnosis present

## 2020-12-10 DIAGNOSIS — C7971 Secondary malignant neoplasm of right adrenal gland: Secondary | ICD-10-CM | POA: Diagnosis not present

## 2020-12-10 DIAGNOSIS — Z79899 Other long term (current) drug therapy: Secondary | ICD-10-CM | POA: Diagnosis not present

## 2020-12-10 DIAGNOSIS — C7989 Secondary malignant neoplasm of other specified sites: Secondary | ICD-10-CM | POA: Diagnosis not present

## 2020-12-10 DIAGNOSIS — Z5111 Encounter for antineoplastic chemotherapy: Secondary | ICD-10-CM | POA: Diagnosis not present

## 2020-12-10 DIAGNOSIS — C7800 Secondary malignant neoplasm of unspecified lung: Secondary | ICD-10-CM | POA: Diagnosis not present

## 2020-12-10 DIAGNOSIS — C189 Malignant neoplasm of colon, unspecified: Secondary | ICD-10-CM | POA: Diagnosis present

## 2020-12-10 DIAGNOSIS — C7931 Secondary malignant neoplasm of brain: Secondary | ICD-10-CM | POA: Diagnosis not present

## 2020-12-10 DIAGNOSIS — Z5189 Encounter for other specified aftercare: Secondary | ICD-10-CM | POA: Diagnosis not present

## 2020-12-10 LAB — HEPATIC FUNCTION PANEL
ALT: 44 — AB (ref 7–35)
AST: 43 — AB (ref 13–35)
Alkaline Phosphatase: 136 — AB (ref 25–125)
Bilirubin, Total: 0.4

## 2020-12-10 LAB — BASIC METABOLIC PANEL
BUN: 15 (ref 4–21)
CO2: 23 — AB (ref 13–22)
Chloride: 101 (ref 99–108)
Creatinine: 1.1 (ref 0.5–1.1)
Glucose: 237
Potassium: 4.3 (ref 3.4–5.3)
Sodium: 136 — AB (ref 137–147)

## 2020-12-10 LAB — CBC AND DIFFERENTIAL
HCT: 36 (ref 36–46)
Hemoglobin: 12.1 (ref 12.0–16.0)
Neutrophils Absolute: 1.14
Platelets: 112 — AB (ref 150–399)
WBC: 2.6

## 2020-12-10 LAB — CBC
MCV: 99 (ref 81–99)
RBC: 3.69 — AB (ref 3.87–5.11)

## 2020-12-10 LAB — PROTEIN, URINE, RANDOM: Total Protein, Urine: 10 mg/dL

## 2020-12-10 LAB — COMPREHENSIVE METABOLIC PANEL
Albumin: 4.2 (ref 3.5–5.0)
Calcium: 8.8 (ref 8.7–10.7)

## 2020-12-10 MED ORDER — SODIUM CHLORIDE 0.9% FLUSH
10.0000 mL | INTRAVENOUS | Status: DC | PRN
  Filled 2020-12-10: qty 10

## 2020-12-10 MED ORDER — SODIUM CHLORIDE 0.9 % IV SOLN
5.0000 mg/kg | Freq: Once | INTRAVENOUS | Status: AC
  Administered 2020-12-10: 500 mg via INTRAVENOUS
  Filled 2020-12-10: qty 16

## 2020-12-10 MED ORDER — LORAZEPAM 1 MG PO TABS
ORAL_TABLET | ORAL | Status: AC
  Filled 2020-12-10: qty 1

## 2020-12-10 MED ORDER — ATROPINE SULFATE 1 MG/ML IJ SOLN
0.5000 mg | Freq: Once | INTRAMUSCULAR | Status: AC | PRN
  Administered 2020-12-10: 0.5 mg via INTRAVENOUS

## 2020-12-10 MED ORDER — PALONOSETRON HCL INJECTION 0.25 MG/5ML
INTRAVENOUS | Status: AC
  Filled 2020-12-10: qty 5

## 2020-12-10 MED ORDER — SODIUM CHLORIDE 0.9 % IV SOLN
150.0000 mg | Freq: Once | INTRAVENOUS | Status: AC
  Administered 2020-12-10: 150 mg via INTRAVENOUS
  Filled 2020-12-10: qty 150

## 2020-12-10 MED ORDER — PALONOSETRON HCL INJECTION 0.25 MG/5ML
0.2500 mg | Freq: Once | INTRAVENOUS | Status: AC
  Administered 2020-12-10: 0.25 mg via INTRAVENOUS

## 2020-12-10 MED ORDER — SODIUM CHLORIDE 0.9 % IV SOLN
10.0000 mg | Freq: Once | INTRAVENOUS | Status: AC
  Administered 2020-12-10: 10 mg via INTRAVENOUS
  Filled 2020-12-10: qty 10

## 2020-12-10 MED ORDER — IRINOTECAN HCL CHEMO INJECTION 100 MG/5ML
135.0000 mg/m2 | Freq: Once | INTRAVENOUS | Status: AC
  Administered 2020-12-10: 280 mg via INTRAVENOUS
  Filled 2020-12-10: qty 14

## 2020-12-10 MED ORDER — ATROPINE SULFATE 1 MG/ML IJ SOLN
INTRAMUSCULAR | Status: AC
  Filled 2020-12-10: qty 1

## 2020-12-10 MED ORDER — FLUOROURACIL CHEMO INJECTION 2.5 GM/50ML
400.0000 mg/m2 | Freq: Once | INTRAVENOUS | Status: AC
  Administered 2020-12-10: 850 mg via INTRAVENOUS
  Filled 2020-12-10: qty 17

## 2020-12-10 MED ORDER — SODIUM CHLORIDE 0.9 % IV SOLN
Freq: Once | INTRAVENOUS | Status: AC
  Filled 2020-12-10: qty 250

## 2020-12-10 MED ORDER — HEPARIN SOD (PORK) LOCK FLUSH 100 UNIT/ML IV SOLN
500.0000 [IU] | Freq: Once | INTRAVENOUS | Status: DC | PRN
  Filled 2020-12-10: qty 5

## 2020-12-10 MED ORDER — LORAZEPAM 1 MG PO TABS
1.0000 mg | ORAL_TABLET | Freq: Once | ORAL | Status: AC
  Administered 2020-12-10: 1 mg via ORAL

## 2020-12-10 MED ORDER — SODIUM CHLORIDE 0.9 % IV SOLN
2400.0000 mg/m2 | INTRAVENOUS | Status: DC
  Administered 2020-12-10: 5000 mg via INTRAVENOUS
  Filled 2020-12-10: qty 100

## 2020-12-10 MED ORDER — LEUCOVORIN CALCIUM INJECTION 350 MG
400.0000 mg/m2 | Freq: Once | INTRAVENOUS | Status: AC
  Administered 2020-12-10: 836 mg via INTRAVENOUS
  Filled 2020-12-10: qty 35

## 2020-12-10 NOTE — Progress Notes (Signed)
OK to treat with ANC=1140 per Dr. Bobby Rumpf.  Zarxio will be added next week to help with neutropenia.

## 2020-12-10 NOTE — Patient Instructions (Signed)
[facility]  Discharge Instructions: Thank you for choosing [facility] to provide your oncology and hematology care.  If you have a lab appointment with the [facility], please go directly to the [facility] and check in at the registration area.   Wear comfortable clothing and clothing appropriate for easy access to any Portacath or PICC line.   We strive to give you quality time with your provider. You may need to reschedule your appointment if you arrive late (15 or more minutes).  Arriving late affects you and other patients whose appointments are after yours.  Also, if you miss three or more appointments without notifying the office, you may be dismissed from the clinic at the provider's discretion.      For prescription refill requests, have your pharmacy contact our office and allow 72 hours for refills to be completed.    Today you received the following chemotherapy and/or immunotherapy agents folfiri/bev   To help prevent nausea and vomiting after your treatment, we encourage you to take your nausea medication as directed.  BELOW ARE SYMPTOMS THAT SHOULD BE REPORTED IMMEDIATELY: *FEVER GREATER THAN 100.4 F (38 C) OR HIGHER *CHILLS OR SWEATING *NAUSEA AND VOMITING THAT IS NOT CONTROLLED WITH YOUR NAUSEA MEDICATION *UNUSUAL SHORTNESS OF BREATH *UNUSUAL BRUISING OR BLEEDING *URINARY PROBLEMS (pain or burning when urinating, or frequent urination) *BOWEL PROBLEMS (unusual diarrhea, constipation, pain near the anus) TENDERNESS IN MOUTH AND THROAT WITH OR WITHOUT PRESENCE OF ULCERS (sore throat, sores in mouth, or a toothache) UNUSUAL RASH, SWELLING OR PAIN  UNUSUAL VAGINAL DISCHARGE OR ITCHING   Items with * indicate a potential emergency and should be followed up as soon as possible or go to the Emergency Department if any problems should occur.  Please show the CHEMOTHERAPY ALERT CARD or IMMUNOTHERAPY ALERT CARD at check-in to the  Emergency Department and triage nurse.  Should you have questions after your visit or need to cancel or reschedule your appointment, please contact [facility]  Dept: [phone]  and follow the prompts.  Office hours are 8:00 a.m. to 4:30 p.m. Monday - Friday. Please note that voicemails left after 4:00 p.m. may not be returned until the following business day.  We are closed weekends and major holidays. You have access to a nurse at all times for urgent questions. Please call the main number to the clinic Dept: [phone] and follow the prompts.  For any non-urgent questions, you may also contact your provider using MyChart. We now offer e-Visits for anyone 20 and older to request care online for non-urgent symptoms. For details visit [email or web address].   Also download the MyChart app! Go to the app store, search "MyChart", open the app, select [facility], and log in with your MyChart username and password.  Due to Covid, a mask is required upon entering the hospital/clinic. If you do not have a mask, one will be given to you upon arrival. For doctor visits, patients may have 1 support person aged 8 or older with them. For treatment visits, patients cannot have anyone with them due to current Covid guidelines and our immunocompromised population.   Leucovorin injection What is this medication? LEUCOVORIN (loo koe VOR in) is used to prevent or treat the harmful effects of some medicines. This medicine is used to treat anemia caused by a low amount of folic acid in the body. It is also used with 5-fluorouracil (5-FU) to treatcolon cancer. This medicine may  be used for other purposes; ask your health care provider orpharmacist if you have questions. What should I tell my care team before I take this medication? They need to know if you have any of these conditions: anemia from low levels of vitamin B-12 in the blood an unusual or allergic reaction to leucovorin,  folic acid, other medicines, foods, dyes, or preservatives pregnant or trying to get pregnant breast-feeding How should I use this medication? This medicine is for injection into a muscle or into a vein. It is given by ahealth care professional in a hospital or clinic setting. Talk to your pediatrician regarding the use of this medicine in children.Special care may be needed. Overdosage: If you think you have taken too much of this medicine contact apoison control center or emergency room at once. NOTE: This medicine is only for you. Do not share this medicine with others. What if I miss a dose? This does not apply. What may interact with this medication? capecitabine fluorouracil phenobarbital phenytoin primidone trimethoprim-sulfamethoxazole This list may not describe all possible interactions. Give your health care provider a list of all the medicines, herbs, non-prescription drugs, or dietary supplements you use. Also tell them if you smoke, drink alcohol, or use illegaldrugs. Some items may interact with your medicine. What should I watch for while using this medication? Your condition will be monitored carefully while you are receiving thismedicine. This medicine may increase the side effects of 5-fluorouracil, 5-FU. Tell your doctor or health care professional if you have diarrhea or mouth sores that donot get better or that get worse. What side effects may I notice from receiving this medication? Side effects that you should report to your doctor or health care professionalas soon as possible: allergic reactions like skin rash, itching or hives, swelling of the face, lips, or tongue breathing problems fever, infection mouth sores unusual bleeding or bruising unusually weak or tired Side effects that usually do not require medical attention (report to yourdoctor or health care professional if they continue or are bothersome): constipation or diarrhea loss of appetite nausea,  vomiting This list may not describe all possible side effects. Call your doctor for medical advice about side effects. You may report side effects to FDA at1-800-FDA-1088. Where should I keep my medication? This drug is given in a hospital or clinic and will not be stored at home. NOTE: This sheet is a summary. It may not cover all possible information. If you have questions about this medicine, talk to your doctor, pharmacist, orhealth care provider.  2022 Elsevier/Gold Standard (2007-11-22 16:50:29) Irinotecan injection What is this medication? IRINOTECAN (ir in oh TEE kan ) is a chemotherapy drug. It is used to treatcolon and rectal cancer. This medicine may be used for other purposes; ask your health care provider orpharmacist if you have questions. COMMON BRAND NAME(S): Camptosar What should I tell my care team before I take this medication? They need to know if you have any of these conditions: dehydration diarrhea infection (especially a virus infection such as chickenpox, cold sores, or herpes) liver disease low blood counts, like low white cell, platelet, or red cell counts low levels of calcium, magnesium, or potassium in the blood recent or ongoing radiation therapy an unusual or allergic reaction to irinotecan, other medicines, foods, dyes, or preservatives pregnant or trying to get pregnant breast-feeding How should I use this medication? This drug is given as an infusion into a vein. It is administered in a hospitalor clinic by a specially trained  health care professional. Talk to your pediatrician regarding the use of this medicine in children.Special care may be needed. Overdosage: If you think you have taken too much of this medicine contact apoison control center or emergency room at once. NOTE: This medicine is only for you. Do not share this medicine with others. What if I miss a dose? It is important not to miss your dose. Call your doctor or health careprofessional  if you are unable to keep an appointment. What may interact with this medication? Do not take this medicine with any of the following medications: cobicistat itraconazole This medicine may interact with the following medications: antiviral medicines for HIV or AIDS certain antibiotics like rifampin or rifabutin certain medicines for fungal infections like ketoconazole, posaconazole, and voriconazole certain medicines for seizures like carbamazepine, phenobarbital, phenotoin clarithromycin gemfibrozil nefazodone St. John's Wort This list may not describe all possible interactions. Give your health care provider a list of all the medicines, herbs, non-prescription drugs, or dietary supplements you use. Also tell them if you smoke, drink alcohol, or use illegaldrugs. Some items may interact with your medicine. What should I watch for while using this medication? Your condition will be monitored carefully while you are receiving this medicine. You will need important blood work done while you are taking thismedicine. This drug may make you feel generally unwell. This is not uncommon, as chemotherapy can affect healthy cells as well as cancer cells. Report any side effects. Continue your course of treatment even though you feel ill unless yourdoctor tells you to stop. In some cases, you may be given additional medicines to help with side effects.Follow all directions for their use. You may get drowsy or dizzy. Do not drive, use machinery, or do anything that needs mental alertness until you know how this medicine affects you. Do not stand or sit up quickly, especially if you are an older patient. This reducesthe risk of dizzy or fainting spells. Call your health care professional for advice if you get a fever, chills, or sore throat, or other symptoms of a cold or flu. Do not treat yourself. This medicine decreases your body's ability to fight infections. Try to avoid beingaround people who are  sick. Avoid taking products that contain aspirin, acetaminophen, ibuprofen, naproxen, or ketoprofen unless instructed by your doctor. These medicines may hide afever. This medicine may increase your risk to bruise or bleed. Call your doctor orhealth care professional if you notice any unusual bleeding. Be careful brushing and flossing your teeth or using a toothpick because you may get an infection or bleed more easily. If you have any dental work done,tell your dentist you are receiving this medicine. Do not become pregnant while taking this medicine or for 6 months after stopping it. Women should inform their health care professional if they wish to become pregnant or think they might be pregnant. Men should not father a child while taking this medicine and for 3 months after stopping it. There is potential for serious side effects to an unborn child. Talk to your health careprofessional for more information. Do not breast-feed an infant while taking this medicine or for 7 days afterstopping it. This medicine has caused ovarian failure in some women. This medicine may make it more difficult to get pregnant. Talk to your health care professional if [place] concerned about your fertility. This medicine has caused decreased sperm counts in some men. This may make it more difficult to father a child. Talk to your health care professional  if youare concerned about your fertility. What side effects may I notice from receiving this medication? Side effects that you should report to your doctor or health care professionalas soon as possible: allergic reactions like skin rash, itching or hives, swelling of the face, lips, or tongue chest pain diarrhea flushing, runny nose, sweating during infusion low blood counts - this medicine may decrease the number of white blood cells, red blood cells and platelets. You may be at increased risk for infections and bleeding. nausea, vomiting pain, swelling, warmth in the  leg signs of decreased platelets or bleeding - bruising, pinpoint red spots on the skin, black, tarry stools, blood in the urine signs of infection - fever or chills, cough, sore throat, pain or difficulty passing urine signs of decreased red blood cells - unusually weak or tired, fainting spells, lightheadedness Side effects that usually do not require medical attention (report to yourdoctor or health care professional if they continue or are bothersome): constipation hair loss headache loss of appetite mouth sores stomach pain This list may not describe all possible side effects. Call your doctor for medical advice about side effects. You may report side effects to FDA at1-800-FDA-1088. Where should I keep my medication? This drug is given in a hospital or clinic and will not be stored at home. NOTE: This sheet is a summary. It may not cover all possible information. If you have questions about this medicine, talk to your doctor, pharmacist, orhealth care provider.  2022 Elsevier/Gold Standard (2019-04-18 17:46:13) Fluorouracil, 5-FU injection What is this medication? FLUOROURACIL, 5-FU (flure oh YOOR a sil) is a chemotherapy drug. It slows the growth of cancer cells. This medicine is used to treat many types of cancer like breast cancer, colon or rectal cancer, pancreatic cancer, and stomachcancer. This medicine may be used for other purposes; ask your health care provider orpharmacist if you have questions. COMMON BRAND NAME(S): Adrucil What should I tell my care team before I take this medication? They need to know if you have any of these conditions: blood disorders dihydropyrimidine dehydrogenase (DPD) deficiency infection (especially a virus infection such as chickenpox, cold sores, or herpes) kidney disease liver disease malnourished, poor nutrition recent or ongoing radiation therapy an unusual or allergic reaction to fluorouracil, other chemotherapy, other medicines, foods,  dyes, or preservatives pregnant or trying to get pregnant breast-feeding How should I use this medication? This drug is given as an infusion or injection into a vein. It is administeredin a hospital or clinic by a specially trained health care professional. Talk to your pediatrician regarding the use of this medicine in children.Special care may be needed. Overdosage: If you think you have taken too much of this medicine contact apoison control center or emergency room at once. NOTE: This medicine is only for you. Do not share this medicine with others. What if I miss a dose? It is important not to miss your dose. Call your doctor or health careprofessional if you are unable to keep an appointment. What may interact with this medication? Do not take this medicine with any of the following medications: live virus vaccines This medicine may also interact with the following medications: medicines that treat or prevent blood clots like warfarin, enoxaparin, and dalteparin This list may not describe all possible interactions. Give your health care provider a list of all the medicines, herbs, non-prescription drugs, or dietary supplements you use. Also tell them if you smoke, drink alcohol, or use illegaldrugs. Some items may interact with your medicine.  What should I watch for while using this medication? Visit your doctor for checks on your progress. This drug may make you feel generally unwell. This is not uncommon, as chemotherapy can affect healthy cells as well as cancer cells. Report any side effects. Continue your course oftreatment even though you feel ill unless your doctor tells you to stop. In some cases, you may be given additional medicines to help with side effects.Follow all directions for their use. Call your doctor or health care professional for advice if you get a fever, chills or sore throat, or other symptoms of a cold or flu. Do not treat yourself. This drug decreases your body's  ability to fight infections. Try toavoid being around people who are sick. This medicine may increase your risk to bruise or bleed. Call your doctor orhealth care professional if you notice any unusual bleeding. Be careful brushing and flossing your teeth or using a toothpick because you may get an infection or bleed more easily. If you have any dental work done,tell your dentist you are receiving this medicine. Avoid taking products that contain aspirin, acetaminophen, ibuprofen, naproxen, or ketoprofen unless instructed by your doctor. These medicines may hide afever. Do not become pregnant while taking this medicine. Women should inform their doctor if they wish to become pregnant or think they might be pregnant. There is a potential for serious side effects to an unborn child. Talk to your health care professional or pharmacist for more information. Do not breast-feed aninfant while taking this medicine. Men should inform their doctor if they wish to father a child. This medicinemay lower sperm counts. Do not treat diarrhea with over the counter products. Contact your doctor ifyou have diarrhea that lasts more than 2 days or if it is severe and watery. This medicine can make you more sensitive to the sun. Keep out of the sun. If you cannot avoid being in the sun, wear protective clothing and use sunscreen.Do not use sun lamps or tanning beds/booths. What side effects may I notice from receiving this medication? Side effects that you should report to your doctor or health care professionalas soon as possible: allergic reactions like skin rash, itching or hives, swelling of the face, lips, or tongue low blood counts - this medicine may decrease the number of white blood cells, red blood cells and platelets. You may be at increased risk for infections and bleeding. signs of infection - fever or chills, cough, sore throat, pain or difficulty passing urine signs of decreased platelets or bleeding -  bruising, pinpoint red spots on the skin, black, tarry stools, blood in the urine signs of decreased red blood cells - unusually weak or tired, fainting spells, lightheadedness breathing problems changes in vision chest pain mouth sores nausea and vomiting pain, swelling, redness at site where injected pain, tingling, numbness in the hands or feet redness, swelling, or sores on hands or feet stomach pain unusual bleeding Side effects that usually do not require medical attention (report to yourdoctor or health care professional if they continue or are bothersome): changes in finger or toe nails diarrhea dry or itchy skin hair loss headache loss of appetite sensitivity of eyes to the light stomach upset unusually teary eyes This list may not describe all possible side effects. Call your doctor for medical advice about side effects. You may report side effects to FDA at1-800-FDA-1088. Where should I keep my medication? This drug is given in a hospital or clinic and will not be stored at home.  NOTE: This sheet is a summary. It may not cover all possible information. If you have questions about this medicine, talk to your doctor, pharmacist, orhealth care provider.  2022 Elsevier/Gold Standard (2019-04-18 15:00:03) Bevacizumab injection What is this medication? BEVACIZUMAB (be va SIZ yoo mab) is a monoclonal antibody. It is used to treatmany types of cancer. This medicine may be used for other purposes; ask your health care provider orpharmacist if you have questions. COMMON BRAND NAME(S): Avastin, MVASI, Noah Charon What should I tell my care team before I take this medication? They need to know if you have any of these conditions: diabetes heart disease high blood pressure history of coughing up blood prior anthracycline chemotherapy (e.g., doxorubicin, daunorubicin, epirubicin) recent or ongoing radiation therapy recent or planning to have surgery stroke an unusual or allergic  reaction to bevacizumab, hamster proteins, mouse proteins, other medicines, foods, dyes, or preservatives pregnant or trying to get pregnant breast-feeding How should I use this medication? This medicine is for infusion into a vein. It is given by a health careprofessional in a hospital or clinic setting. Talk to your pediatrician regarding the use of this medicine in children.Special care may be needed. Overdosage: If you think you have taken too much of this medicine contact apoison control center or emergency room at once. NOTE: This medicine is only for you. Do not share this medicine with others. What if I miss a dose? It is important not to miss your dose. Call your doctor or health careprofessional if you are unable to keep an appointment. What may interact with this medication? Interactions are not expected. This list may not describe all possible interactions. Give your health care provider a list of all the medicines, herbs, non-prescription drugs, or dietary supplements you use. Also tell them if you smoke, drink alcohol, or use illegaldrugs. Some items may interact with your medicine. What should I watch for while using this medication? Your condition will be monitored carefully while you are receiving this medicine. You will need important blood work and urine testing done while youare taking this medicine. This medicine may increase your risk to bruise or bleed. Call your doctor orhealth care professional if you notice any unusual bleeding. Before having surgery, talk to your health care provider to make sure it is ok. This drug can increase the risk of poor healing of your surgical site or wound. You will need to stop this drug for 28 days before surgery. After surgery, wait at least 28 days before restarting this drug. Make sure the surgical site or wound is healed enough before restarting this drug. Talk to your health careprovider if questions. Do not become pregnant while taking  this medicine or for 6 months after stopping it. Women should inform their doctor if they wish to become pregnant or think they might be pregnant. There is a potential for serious side effects to an unborn child. Talk to your health care professional or pharmacist for more information. Do not breast-feed an infant while taking this medicine andfor 6 months after the last dose. This medicine has caused ovarian failure in some women. This medicine may interfere with the ability to have a child. You should talk to your doctor orhealth care professional if you are concerned about your fertility. What side effects may I notice from receiving this medication? Side effects that you should report to your doctor or health care professionalas soon as possible: allergic reactions like skin rash, itching or hives, swelling of the face, lips,  or tongue chest pain or chest tightness chills coughing up blood high fever seizures severe constipation signs and symptoms of bleeding such as bloody or black, tarry stools; red or dark-brown urine; spitting up blood or brown material that looks like coffee grounds; red spots on the skin; unusual bruising or bleeding from the eye, gums, or nose signs and symptoms of a blood clot such as breathing problems; chest pain; severe, sudden headache; pain, swelling, warmth in the leg signs and symptoms of a stroke like changes in vision; confusion; trouble speaking or understanding; severe headaches; sudden numbness or weakness of the face, arm or leg; trouble walking; dizziness; loss of balance or coordination stomach pain sweating swelling of legs or ankles vomiting weight gain Side effects that usually do not require medical attention (report to yourdoctor or health care professional if they continue or are bothersome): back pain changes in taste decreased appetite dry skin nausea tiredness This list may not describe all possible side effects. Call your doctor for  medical advice about side effects. You may report side effects to FDA at1-800-FDA-1088. Where should I keep my medication? This drug is given in a hospital or clinic and will not be stored at home. NOTE: This sheet is a summary. It may not cover all possible information. If you have questions about this medicine, talk to your doctor, pharmacist, orhealth care provider.  2022 Elsevier/Gold Standard (2019-03-15 10:50:46)

## 2020-12-11 ENCOUNTER — Encounter: Payer: Self-pay | Admitting: Oncology

## 2020-12-12 ENCOUNTER — Other Ambulatory Visit: Payer: Self-pay

## 2020-12-12 ENCOUNTER — Inpatient Hospital Stay: Payer: PRIVATE HEALTH INSURANCE

## 2020-12-12 VITALS — BP 110/77 | HR 100 | Temp 98.4°F | Resp 18 | Ht 68.0 in | Wt 201.0 lb

## 2020-12-12 DIAGNOSIS — C182 Malignant neoplasm of ascending colon: Secondary | ICD-10-CM

## 2020-12-12 DIAGNOSIS — Z5111 Encounter for antineoplastic chemotherapy: Secondary | ICD-10-CM | POA: Diagnosis not present

## 2020-12-12 MED ORDER — HEPARIN SOD (PORK) LOCK FLUSH 100 UNIT/ML IV SOLN
500.0000 [IU] | Freq: Once | INTRAVENOUS | Status: AC | PRN
  Administered 2020-12-12: 500 [IU]
  Filled 2020-12-12: qty 5

## 2020-12-12 MED ORDER — SODIUM CHLORIDE 0.9% FLUSH
10.0000 mL | INTRAVENOUS | Status: DC | PRN
  Administered 2020-12-12: 10 mL
  Filled 2020-12-12: qty 10

## 2020-12-12 NOTE — Patient Instructions (Signed)
[facility] Discharge Instructions for Patients receiving Home Portable Chemo Pump    **The bag should finish at 46 hours, 96 hours or 7 days. For example, if your pump is scheduled for 46 hours and it was put on at 4pm, it should finish at 2 pm the day it is scheduled to come off regardless of your appointment time.    Estimated time to finish   _________________________ (Have your nurse fill in)     ** if the display on your pump reads "Low Volume" and it is beeping, take the batteries out of the pump and come to the cancer center for it to be taken off.   **If the pump alarms go off prior to the pump reading "Low Volume" then call the [phone] and someone can assist you.  **If the plunger comes out and the bag fluid is running out, please use your chemo spill kit to clean up the spill. Do not use paper towels or other house hold products.  ** If you have problems or questions regarding your pump, please call either the [phone] or the cancer center Monday-Friday 8:00am-4:30pm at [phone] and we will assist you.

## 2020-12-13 ENCOUNTER — Encounter: Payer: Self-pay | Admitting: Oncology

## 2020-12-16 ENCOUNTER — Encounter: Payer: Self-pay | Admitting: Oncology

## 2020-12-17 ENCOUNTER — Other Ambulatory Visit: Payer: Self-pay

## 2020-12-17 ENCOUNTER — Inpatient Hospital Stay: Payer: PRIVATE HEALTH INSURANCE

## 2020-12-17 VITALS — BP 125/84 | HR 115 | Temp 97.6°F | Resp 18 | Ht 68.0 in | Wt 194.5 lb

## 2020-12-17 DIAGNOSIS — Z5111 Encounter for antineoplastic chemotherapy: Secondary | ICD-10-CM | POA: Diagnosis not present

## 2020-12-17 DIAGNOSIS — C182 Malignant neoplasm of ascending colon: Secondary | ICD-10-CM

## 2020-12-17 MED ORDER — FILGRASTIM-SNDZ 480 MCG/0.8ML IJ SOSY
480.0000 ug | PREFILLED_SYRINGE | Freq: Once | INTRAMUSCULAR | Status: AC
  Administered 2020-12-17: 480 ug via SUBCUTANEOUS

## 2020-12-17 MED ORDER — FILGRASTIM-SNDZ 480 MCG/0.8ML IJ SOSY
PREFILLED_SYRINGE | INTRAMUSCULAR | Status: AC
  Filled 2020-12-17: qty 0.8

## 2020-12-17 NOTE — Patient Instructions (Signed)
Filgrastim, G-CSF injection What is this medication? FILGRASTIM, G-CSF (fil GRA stim) is a granulocyte colony-stimulating factor that stimulates the growth of neutrophils, a type of white blood cell (WBC) important in the body's fight against infection. It is used to reduce the incidence of fever and infection in patients with certain types of cancer who are receiving chemotherapy that affects the bone marrow, to stimulate blood cell production for removal of WBCs from the body prior to a bone marrow transplantation, to reduce the incidence of fever and infection in patients who have severe chronic neutropenia, and to improve survival outcomes following high-dose radiation exposure that is toxic to the bone marrow. This medicine may be used for other purposes; ask your health care provider or pharmacist if you have questions. COMMON BRAND NAME(S): Neupogen, Nivestym, Releuko, Zarxio What should I tell my care team before I take this medication? They need to know if you have any of these conditions: kidney disease latex allergy ongoing radiation therapy sickle cell disease an unusual or allergic reaction to filgrastim, pegfilgrastim, other medicines, foods, dyes, or preservatives pregnant or trying to get pregnant breast-feeding How should I use this medication? This medicine is for injection under the skin or infusion into a vein. As an infusion into a vein, it is usually given by a health care professional in a hospital or clinic setting. If you get this medicine at home, you will be taught how to prepare and give this medicine. Refer to the Instructions for Use that come with your medication packaging. Use exactly as directed. Take your medicine at regular intervals. Do not take your medicine more often than directed. It is important that you put your used needles and syringes in a special sharps container. Do not put them in a trash can. If you do not have a sharps container, call your pharmacist  or healthcare provider to get one. Talk to your pediatrician regarding the use of this medicine in children. While this drug may be prescribed for children as young as 7 months for selected conditions, precautions do apply. Overdosage: If you think you have taken too much of this medicine contact a poison control center or emergency room at once. NOTE: This medicine is only for you. Do not share this medicine with others. What if I miss a dose? It is important not to miss your dose. Call your doctor or health care professional if you miss a dose. What may interact with this medication? This medicine may interact with the following medications: medicines that may cause a release of neutrophils, such as lithium This list may not describe all possible interactions. Give your health care provider a list of all the medicines, herbs, non-prescription drugs, or dietary supplements you use. Also tell them if you smoke, drink alcohol, or use illegal drugs. Some items may interact with your medicine. What should I watch for while using this medication? Your condition will be monitored carefully while you are receiving this medicine. You may need blood work done while you are taking this medicine. Talk to your health care provider about your risk of cancer. You may be more at risk for certain types of cancer if you take this medicine. What side effects may I notice from receiving this medication? Side effects that you should report to your doctor or health care professional as soon as possible: allergic reactions like skin rash, itching or hives, swelling of the face, lips, or tongue back pain dizziness or feeling faint fever pain, redness, or   irritation at site where injected pinpoint red spots on the skin shortness of breath or breathing problems signs and symptoms of kidney injury like trouble passing urine, change in the amount of urine, or red or dark-brown urine stomach or side pain, or pain at  the shoulder swelling tiredness unusual bleeding or bruising Side effects that usually do not require medical attention (report to your doctor or health care professional if they continue or are bothersome): bone pain cough diarrhea hair loss headache muscle pain This list may not describe all possible side effects. Call your doctor for medical advice about side effects. You may report side effects to FDA at 1-800-FDA-1088. Where should I keep my medication? Keep out of the reach of children. Store in a refrigerator between 2 and 8 degrees C (36 and 46 degrees F). Do not freeze. Keep in carton to protect from light. Throw away this medicine if vials or syringes are left out of the refrigerator for more than 24 hours. Throw away any unused medicine after the expiration date. NOTE: This sheet is a summary. It may not cover all possible information. If you have questions about this medicine, talk to your doctor, pharmacist, or health care provider.  2022 Elsevier/Gold Standard (2019-06-08 18:47:55)  

## 2020-12-18 ENCOUNTER — Other Ambulatory Visit: Payer: Self-pay

## 2020-12-18 ENCOUNTER — Inpatient Hospital Stay: Payer: PRIVATE HEALTH INSURANCE

## 2020-12-18 VITALS — BP 122/77 | HR 110 | Temp 98.1°F | Resp 18 | Ht 68.0 in | Wt 197.0 lb

## 2020-12-18 DIAGNOSIS — C182 Malignant neoplasm of ascending colon: Secondary | ICD-10-CM

## 2020-12-18 DIAGNOSIS — Z5111 Encounter for antineoplastic chemotherapy: Secondary | ICD-10-CM | POA: Diagnosis not present

## 2020-12-18 MED ORDER — FILGRASTIM-SNDZ 480 MCG/0.8ML IJ SOSY
PREFILLED_SYRINGE | INTRAMUSCULAR | Status: AC
  Filled 2020-12-18: qty 0.8

## 2020-12-18 MED ORDER — FILGRASTIM-SNDZ 480 MCG/0.8ML IJ SOSY
480.0000 ug | PREFILLED_SYRINGE | Freq: Once | INTRAMUSCULAR | Status: AC
  Administered 2020-12-18: 480 ug via SUBCUTANEOUS

## 2020-12-20 ENCOUNTER — Encounter: Payer: Self-pay | Admitting: Hematology and Oncology

## 2020-12-20 ENCOUNTER — Encounter: Payer: Self-pay | Admitting: Oncology

## 2020-12-20 ENCOUNTER — Inpatient Hospital Stay (INDEPENDENT_AMBULATORY_CARE_PROVIDER_SITE_OTHER): Payer: PRIVATE HEALTH INSURANCE | Admitting: Oncology

## 2020-12-20 ENCOUNTER — Other Ambulatory Visit: Payer: Self-pay

## 2020-12-20 ENCOUNTER — Other Ambulatory Visit: Payer: Self-pay | Admitting: Oncology

## 2020-12-20 ENCOUNTER — Inpatient Hospital Stay: Payer: PRIVATE HEALTH INSURANCE

## 2020-12-20 VITALS — BP 131/82 | HR 106 | Temp 97.7°F | Resp 16 | Ht 68.0 in | Wt 196.9 lb

## 2020-12-20 DIAGNOSIS — Z5111 Encounter for antineoplastic chemotherapy: Secondary | ICD-10-CM | POA: Diagnosis not present

## 2020-12-20 DIAGNOSIS — C182 Malignant neoplasm of ascending colon: Secondary | ICD-10-CM

## 2020-12-20 LAB — BASIC METABOLIC PANEL
BUN: 12 (ref 4–21)
CO2: 20 (ref 13–22)
Chloride: 106 (ref 99–108)
Creatinine: 1.2 — AB (ref 0.5–1.1)
Glucose: 218
Potassium: 3.8 (ref 3.4–5.3)
Sodium: 139 (ref 137–147)

## 2020-12-20 LAB — HEPATIC FUNCTION PANEL
ALT: 34 (ref 7–35)
AST: 30 (ref 13–35)
Alkaline Phosphatase: 180 — AB (ref 25–125)
Bilirubin, Total: 0.3

## 2020-12-20 LAB — COMPREHENSIVE METABOLIC PANEL
Albumin: 4.2 (ref 3.5–5.0)
Calcium: 9 (ref 8.7–10.7)

## 2020-12-20 LAB — CBC: RBC: 3.69 — AB (ref 3.87–5.11)

## 2020-12-20 LAB — CBC AND DIFFERENTIAL
HCT: 36 (ref 36–46)
Hemoglobin: 12.1 (ref 12.0–16.0)
Neutrophils Absolute: 6.36
Platelets: 132 — AB (ref 150–399)
WBC: 8.6

## 2020-12-20 LAB — TOTAL PROTEIN, URINE DIPSTICK: Protein, ur: 30 mg/dL — AB

## 2020-12-20 NOTE — Progress Notes (Signed)
[facility]  [address] [place],  [place]  [address] [phone]  Clinic Day:  12/20/2020  Referring physician: Greig Right, MD  This document serves as a record of services personally performed by Elim Peale Macarthur Critchley, MD. It was created on their behalf by [facility], a trained medical scribe. The creation of this record is based on the scribe's personal observations and the provider's statements to them.  HISTORY OF PRESENT ILLNESS:  The patient is a 55 y.o. female with metastatic colon cancer, which includes spread of disease to her abdominal cavity, lungs, brain and right adrenal gland. She comes in today to be evaluated before heading into her 11th cycle of FOLFIRI/Avastin.  The patient claims to have toleraed her 10th cycle of treatment fairly well.  Her diarrhea, although still present, is better since her irinotecan was decreased.  Imodium has also helped slow down her bowel frequency.  She denies having any new GI symptoms which concern her for disease progression.    With respect to her colon cancer history, she is status post a right hemicolectomy in early October 2018, followed by 12 cycles of adjuvant chemotherapy, which were completed in April 2019.  In December 2021, she underwent a left cerebellar metastatectomy, whose pathology was consistent with metastatic colon cancer.  Tumor testing did come back MMR normal.  CT scans recently showed evidence of her cancer being in multiple locations, which has her on palliative chemotherapy at this time.    PHYSICAL EXAM:  Blood pressure 131/82, pulse (!) 106, temperature 97.7 F (36.5 C), resp. rate 16, height 5' 8"  (1.727 m), weight 196 lb 14.4 oz (89.3 kg), last menstrual period 02/22/2019, SpO2 94 %. Wt Readings from Last 3 Encounters:  12/23/20 198 lb (89.8 kg)  12/20/20 196 lb 14.4 oz (89.3 kg)  12/18/20 197 lb (89.4 kg)   Body mass index is 29.94 kg/m. Performance status (ECOG):  1 Physical Exam Constitutional:      Appearance: Normal appearance. She is not ill-appearing.  HENT:     Mouth/Throat:     Mouth: Mucous membranes are moist.     Pharynx: Oropharynx is clear. No oropharyngeal exudate or posterior oropharyngeal erythema.  Cardiovascular:     Rate and Rhythm: Normal rate and regular rhythm.     Heart sounds: No murmur heard.   No friction rub. No gallop.  Pulmonary:     Effort: Pulmonary effort is normal. No respiratory distress.     Breath sounds: Normal breath sounds. No wheezing, rhonchi or rales.  Chest:  Breasts:    Right: No axillary adenopathy or supraclavicular adenopathy.     Left: No axillary adenopathy or supraclavicular adenopathy.  Abdominal:     General: Bowel sounds are normal. There is no distension.     Palpations: Abdomen is soft. There is no mass.     Tenderness: There is no abdominal tenderness.  Musculoskeletal:        General: No swelling.     Right lower leg: No edema.     Left lower leg: No edema.  Lymphadenopathy:     Cervical: No cervical adenopathy.     Upper Body:     Right upper body: No supraclavicular or axillary adenopathy.     Left upper body: No supraclavicular or axillary adenopathy.     Lower Body: No right inguinal adenopathy. No left inguinal adenopathy.  Skin:    General: Skin is warm.     Coloration: Skin is not  jaundiced.     Findings: No lesion or rash.  Neurological:     General: No focal deficit present.     Mental Status: She is alert and oriented to person, place, and time. Mental status is at baseline.     Cranial Nerves: Cranial nerves are intact.  Psychiatric:        Mood and Affect: Mood normal.        Behavior: Behavior normal.        Thought Content: Thought content normal.   LABS:     ASSESSMENT & PLAN:  Assessment/Plan:  A 55 y.o. female with metastatic colon cancer.  She will proceed with her 11th cycle of FOLFIRI/Avastin next week.  Clinically, she appears to be doing very well.   She brings to my attention that another brain MRI in [place] will be scheduled in early August.  I will see her back in 2 weeks before she heads into her 12th cycle of  FOLFIRI/Avastin.  The patient understands all the plans discussed today and is in agreement with them.     I, Rita Ohara, am acting as scribe for Marice Potter, MD    I have reviewed this report as typed by the medical scribe, and it is complete and accurate.  Krystel Fletchall Macarthur Critchley, MD

## 2020-12-23 ENCOUNTER — Inpatient Hospital Stay: Payer: PRIVATE HEALTH INSURANCE

## 2020-12-23 ENCOUNTER — Other Ambulatory Visit: Payer: Self-pay

## 2020-12-23 VITALS — BP 134/70 | HR 101 | Temp 97.4°F | Resp 18 | Ht 68.0 in | Wt 198.0 lb

## 2020-12-23 DIAGNOSIS — T451X5A Adverse effect of antineoplastic and immunosuppressive drugs, initial encounter: Secondary | ICD-10-CM

## 2020-12-23 DIAGNOSIS — R110 Nausea: Secondary | ICD-10-CM

## 2020-12-23 DIAGNOSIS — C182 Malignant neoplasm of ascending colon: Secondary | ICD-10-CM

## 2020-12-23 DIAGNOSIS — Z5111 Encounter for antineoplastic chemotherapy: Secondary | ICD-10-CM | POA: Diagnosis not present

## 2020-12-23 MED ORDER — SODIUM CHLORIDE 0.9 % IV SOLN
150.0000 mg | Freq: Once | INTRAVENOUS | Status: AC
  Administered 2020-12-23: 150 mg via INTRAVENOUS
  Filled 2020-12-23: qty 150

## 2020-12-23 MED ORDER — SODIUM CHLORIDE 0.9 % IV SOLN
Freq: Once | INTRAVENOUS | Status: AC
  Filled 2020-12-23: qty 250

## 2020-12-23 MED ORDER — SODIUM CHLORIDE 0.9 % IV SOLN
5.0000 mg/kg | Freq: Once | INTRAVENOUS | Status: AC
  Administered 2020-12-23: 450 mg via INTRAVENOUS
  Filled 2020-12-23: qty 16

## 2020-12-23 MED ORDER — FLUOROURACIL CHEMO INJECTION 2.5 GM/50ML
400.0000 mg/m2 | Freq: Once | INTRAVENOUS | Status: AC
  Administered 2020-12-23: 850 mg via INTRAVENOUS
  Filled 2020-12-23: qty 17

## 2020-12-23 MED ORDER — SODIUM CHLORIDE 0.9 % IV SOLN
10.0000 mg | Freq: Once | INTRAVENOUS | Status: AC
  Administered 2020-12-23: 10 mg via INTRAVENOUS
  Filled 2020-12-23: qty 10

## 2020-12-23 MED ORDER — IRINOTECAN HCL CHEMO INJECTION 100 MG/5ML
135.0000 mg/m2 | Freq: Once | INTRAVENOUS | Status: AC
  Administered 2020-12-23: 280 mg via INTRAVENOUS
  Filled 2020-12-23: qty 14

## 2020-12-23 MED ORDER — LORAZEPAM 1 MG PO TABS
ORAL_TABLET | ORAL | Status: AC
  Filled 2020-12-23: qty 1

## 2020-12-23 MED ORDER — PALONOSETRON HCL INJECTION 0.25 MG/5ML
INTRAVENOUS | Status: AC
  Filled 2020-12-23: qty 5

## 2020-12-23 MED ORDER — SODIUM CHLORIDE 0.9 % IV SOLN
2400.0000 mg/m2 | INTRAVENOUS | Status: DC
  Administered 2020-12-23: 5000 mg via INTRAVENOUS
  Filled 2020-12-23: qty 100

## 2020-12-23 MED ORDER — LEUCOVORIN CALCIUM INJECTION 350 MG
400.0000 mg/m2 | Freq: Once | INTRAVENOUS | Status: AC
  Administered 2020-12-23: 836 mg via INTRAVENOUS
  Filled 2020-12-23: qty 41.8

## 2020-12-23 MED ORDER — ATROPINE SULFATE 1 MG/ML IJ SOLN: 0.5000 mg | Freq: Once | INTRAMUSCULAR | Status: DC | PRN

## 2020-12-23 MED ORDER — PALONOSETRON HCL INJECTION 0.25 MG/5ML
0.2500 mg | Freq: Once | INTRAVENOUS | Status: AC
  Administered 2020-12-23: 0.25 mg via INTRAVENOUS

## 2020-12-23 MED ORDER — LORAZEPAM 1 MG PO TABS
0.5000 mg | ORAL_TABLET | Freq: Once | ORAL | Status: AC | PRN
  Administered 2020-12-23: 0.5 mg via ORAL

## 2020-12-23 NOTE — Patient Instructions (Signed)
Fluorouracil, 5-FU injection What is this medication? FLUOROURACIL, 5-FU (flure oh YOOR a sil) is a chemotherapy drug. It slows the growth of cancer cells. This medicine is used to treat many types of cancer like breast cancer, colon or rectal cancer, pancreatic cancer, and stomachcancer. This medicine may be used for other purposes; ask your health care provider orpharmacist if you have questions. COMMON BRAND NAME(S): Adrucil What should I tell my care team before I take this medication? They need to know if you have any of these conditions: blood disorders dihydropyrimidine dehydrogenase (DPD) deficiency infection (especially a virus infection such as chickenpox, cold sores, or herpes) kidney disease liver disease malnourished, poor nutrition recent or ongoing radiation therapy an unusual or allergic reaction to fluorouracil, other chemotherapy, other medicines, foods, dyes, or preservatives pregnant or trying to get pregnant breast-feeding How should I use this medication? This drug is given as an infusion or injection into a vein. It is administeredin a hospital or clinic by a specially trained health care professional. Talk to your pediatrician regarding the use of this medicine in children.Special care may be needed. Overdosage: If you think you have taken too much of this medicine contact apoison control center or emergency room at once. NOTE: This medicine is only for you. Do not share this medicine with others. What if I miss a dose? It is important not to miss your dose. Call your doctor or health careprofessional if you are unable to keep an appointment. What may interact with this medication? Do not take this medicine with any of the following medications: live virus vaccines This medicine may also interact with the following medications: medicines that treat or prevent blood clots like warfarin, enoxaparin, and dalteparin This list may not describe all possible  interactions. Give your health care provider a list of all the medicines, herbs, non-prescription drugs, or dietary supplements you use. Also tell them if you smoke, drink alcohol, or use illegaldrugs. Some items may interact with your medicine. What should I watch for while using this medication? Visit your doctor for checks on your progress. This drug may make you feel generally unwell. This is not uncommon, as chemotherapy can affect healthy cells as well as cancer cells. Report any side effects. Continue your course oftreatment even though you feel ill unless your doctor tells you to stop. In some cases, you may be given additional medicines to help with side effects.Follow all directions for their use. Call your doctor or health care professional for advice if you get a fever, chills or sore throat, or other symptoms of a cold or flu. Do not treat yourself. This drug decreases your body's ability to fight infections. Try toavoid being around people who are sick. This medicine may increase your risk to bruise or bleed. Call your doctor orhealth care professional if you notice any unusual bleeding. Be careful brushing and flossing your teeth or using a toothpick because you may get an infection or bleed more easily. If you have any dental work done,tell your dentist you are receiving this medicine. Avoid taking products that contain aspirin, acetaminophen, ibuprofen, naproxen, or ketoprofen unless instructed by your doctor. These medicines may hide afever. Do not become pregnant while taking this medicine. Women should inform their doctor if they wish to become pregnant or think they might be pregnant. There is a potential for serious side effects to an unborn child. Talk to your health care professional or pharmacist for more information. Do not breast-feed aninfant while taking this   medicine. Men should inform their doctor if they wish to father a child. This medicinemay lower sperm counts. Do not  treat diarrhea with over the counter products. Contact your doctor ifyou have diarrhea that lasts more than 2 days or if it is severe and watery. This medicine can make you more sensitive to the sun. Keep out of the sun. If you cannot avoid being in the sun, wear protective clothing and use sunscreen.Do not use sun lamps or tanning beds/booths. What side effects may I notice from receiving this medication? Side effects that you should report to your doctor or health care professionalas soon as possible: allergic reactions like skin rash, itching or hives, swelling of the face, lips, or tongue low blood counts - this medicine may decrease the number of white blood cells, red blood cells and platelets. You may be at increased risk for infections and bleeding. signs of infection - fever or chills, cough, sore throat, pain or difficulty passing urine signs of decreased platelets or bleeding - bruising, pinpoint red spots on the skin, black, tarry stools, blood in the urine signs of decreased red blood cells - unusually weak or tired, fainting spells, lightheadedness breathing problems changes in vision chest pain mouth sores nausea and vomiting pain, swelling, redness at site where injected pain, tingling, numbness in the hands or feet redness, swelling, or sores on hands or feet stomach pain unusual bleeding Side effects that usually do not require medical attention (report to yourdoctor or health care professional if they continue or are bothersome): changes in finger or toe nails diarrhea dry or itchy skin hair loss headache loss of appetite sensitivity of eyes to the light stomach upset unusually teary eyes This list may not describe all possible side effects. Call your doctor for medical advice about side effects. You may report side effects to FDA at1-800-FDA-1088. Where should I keep my medication? This drug is given in a hospital or clinic and will not be stored at home. NOTE:  This sheet is a summary. It may not cover all possible information. If you have questions about this medicine, talk to your doctor, pharmacist, orhealth care provider.  2022 [employer] (2019-04-18 15:00:03) Leucovorin injection What is this medication? LEUCOVORIN (loo koe VOR in) is used to prevent or treat the harmful effects of some medicines. This medicine is used to treat anemia caused by a low amount of folic acid in the body. It is also used with 5-fluorouracil (5-FU) to treatcolon cancer. This medicine may be used for other purposes; ask your health care provider orpharmacist if you have questions. What should I tell my care team before I take this medication? They need to know if you have any of these conditions: anemia from low levels of vitamin B-12 in the blood an unusual or allergic reaction to leucovorin, folic acid, other medicines, foods, dyes, or preservatives pregnant or trying to get pregnant breast-feeding How should I use this medication? This medicine is for injection into a muscle or into a vein. It is given by ahealth care professional in a hospital or clinic setting. Talk to your pediatrician regarding the use of this medicine in children.Special care may be needed. Overdosage: If you think you have taken too much of this medicine contact apoison control center or emergency room at once. NOTE: This medicine is only for you. Do not share this medicine with others. What if I miss a dose? This does not apply. What may interact with this medication? capecitabine fluorouracil phenobarbital  phenytoin primidone trimethoprim-sulfamethoxazole This list may not describe all possible interactions. Give your health care provider a list of all the medicines, herbs, non-prescription drugs, or dietary supplements you use. Also tell them if you smoke, drink alcohol, or use illegaldrugs. Some items may interact with your medicine. What should I watch for while using  this medication? Your condition will be monitored carefully while you are receiving thismedicine. This medicine may increase the side effects of 5-fluorouracil, 5-FU. Tell your doctor or health care professional if you have diarrhea or mouth sores that donot get better or that get worse. What side effects may I notice from receiving this medication? Side effects that you should report to your doctor or health care professionalas soon as possible: allergic reactions like skin rash, itching or hives, swelling of the face, lips, or tongue breathing problems fever, infection mouth sores unusual bleeding or bruising unusually weak or tired Side effects that usually do not require medical attention (report to yourdoctor or health care professional if they continue or are bothersome): constipation or diarrhea loss of appetite nausea, vomiting This list may not describe all possible side effects. Call your doctor for medical advice about side effects. You may report side effects to FDA at1-800-FDA-1088. Where should I keep my medication? This drug is given in a hospital or clinic and will not be stored at home. NOTE: This sheet is a summary. It may not cover all possible information. If you have questions about this medicine, talk to your doctor, pharmacist, orhealth care provider.  2022 [employer] (2007-11-22 16:50:29) Irinotecan injection What is this medication? IRINOTECAN (ir in oh TEE kan ) is a chemotherapy drug. It is used to treatcolon and rectal cancer. This medicine may be used for other purposes; ask your health care provider orpharmacist if you have questions. COMMON BRAND NAME(S): Camptosar What should I tell my care team before I take this medication? They need to know if you have any of these conditions: dehydration diarrhea infection (especially a virus infection such as chickenpox, cold sores, or herpes) liver disease low blood counts, like low white cell,  platelet, or red cell counts low levels of calcium, magnesium, or potassium in the blood recent or ongoing radiation therapy an unusual or allergic reaction to irinotecan, other medicines, foods, dyes, or preservatives pregnant or trying to get pregnant breast-feeding How should I use this medication? This drug is given as an infusion into a vein. It is administered in a hospitalor clinic by a specially trained health care professional. Talk to your pediatrician regarding the use of this medicine in children.Special care may be needed. Overdosage: If you think you have taken too much of this medicine contact apoison control center or emergency room at once. NOTE: This medicine is only for you. Do not share this medicine with others. What if I miss a dose? It is important not to miss your dose. Call your doctor or health careprofessional if you are unable to keep an appointment. What may interact with this medication? Do not take this medicine with any of the following medications: cobicistat itraconazole This medicine may interact with the following medications: antiviral medicines for HIV or AIDS certain antibiotics like rifampin or rifabutin certain medicines for fungal infections like ketoconazole, posaconazole, and voriconazole certain medicines for seizures like carbamazepine, phenobarbital, phenotoin clarithromycin gemfibrozil nefazodone [facility]. John's Wort This list may not describe all possible interactions. Give your health care provider a list of all the medicines, herbs, non-prescription drugs, or dietary supplements you  use. Also tell them if you smoke, drink alcohol, or use illegaldrugs. Some items may interact with your medicine. What should I watch for while using this medication? Your condition will be monitored carefully while you are receiving this medicine. You will need important blood work done while you are taking thismedicine. This drug may make you feel generally  unwell. This is not uncommon, as chemotherapy can affect healthy cells as well as cancer cells. Report any side effects. Continue your course of treatment even though you feel ill unless yourdoctor tells you to stop. In some cases, you may be given additional medicines to help with side effects.Follow all directions for their use. You may get drowsy or dizzy. Do not drive, use machinery, or do anything that needs mental alertness until you know how this medicine affects you. Do not stand or sit up quickly, especially if you are an older patient. This reducesthe risk of dizzy or fainting spells. Call your health care professional for advice if you get a fever, chills, or sore throat, or other symptoms of a cold or flu. Do not treat yourself. This medicine decreases your body's ability to fight infections. Try to avoid beingaround people who are sick. Avoid taking products that contain aspirin, acetaminophen, ibuprofen, naproxen, or ketoprofen unless instructed by your doctor. These medicines may hide afever. This medicine may increase your risk to bruise or bleed. Call your doctor orhealth care professional if you notice any unusual bleeding. Be careful brushing and flossing your teeth or using a toothpick because you may get an infection or bleed more easily. If you have any dental work done,tell your dentist you are receiving this medicine. Do not become pregnant while taking this medicine or for 6 months after stopping it. Women should inform their health care professional if they wish to become pregnant or think they might be pregnant. Men should not father a child while taking this medicine and for 3 months after stopping it. There is potential for serious side effects to an unborn child. Talk to your health careprofessional for more information. Do not breast-feed an infant while taking this medicine or for 7 days afterstopping it. This medicine has caused ovarian failure in some women. This medicine  may make it more difficult to get pregnant. Talk to your health care professional if [place] Sellers concerned about your fertility. This medicine has caused decreased sperm counts in some men. This may make it more difficult to father a child. Talk to your health care professional if [place] S[place] concerned about your fertility. What side effects may I notice from receiving this medication? Side effects that you should report to your doctor or health care professionalas soon as possible: allergic reactions like skin rash, itching or hives, swelling of the face, lips, or tongue chest pain diarrhea flushing, runny nose, sweating during infusion low blood counts - this medicine may decrease the number of white blood cells, red blood cells and platelets. You may be at increased risk for infections and bleeding. nausea, vomiting pain, swelling, warmth in the leg signs of decreased platelets or bleeding - bruising, pinpoint red spots on the skin, black, tarry stools, blood in the urine signs of infection - fever or chills, cough, sore throat, pain or difficulty passing urine signs of decreased red blood cells - unusually weak or tired, fainting spells, lightheadedness Side effects that usually do not require medical attention (report to yourdoctor or health care professional if they continue or are bothersome): constipation hair loss headache loss of  appetite mouth sores stomach pain This list may not describe all possible side effects. Call your doctor for medical advice about side effects. You may report side effects to FDA at1-800-FDA-1088. Where should I keep my medication? This drug is given in a hospital or clinic and will not be stored at home. NOTE: This sheet is a summary. It may not cover all possible information. If you have questions about this medicine, talk to your doctor, pharmacist, orhealth care provider.  2022 [employer] (2019-04-18 17:46:13)

## 2020-12-24 ENCOUNTER — Other Ambulatory Visit: Payer: Self-pay | Admitting: Hematology and Oncology

## 2020-12-25 ENCOUNTER — Other Ambulatory Visit: Payer: Self-pay | Admitting: Hematology and Oncology

## 2020-12-25 ENCOUNTER — Encounter: Payer: Self-pay | Admitting: Oncology

## 2020-12-25 ENCOUNTER — Inpatient Hospital Stay: Payer: PRIVATE HEALTH INSURANCE

## 2020-12-25 ENCOUNTER — Encounter: Payer: Self-pay | Admitting: Hematology and Oncology

## 2020-12-25 ENCOUNTER — Other Ambulatory Visit: Payer: Self-pay

## 2020-12-25 VITALS — BP 80/48 | HR 108 | Temp 98.1°F | Resp 18 | Ht 68.0 in | Wt 200.0 lb

## 2020-12-25 DIAGNOSIS — Z5111 Encounter for antineoplastic chemotherapy: Secondary | ICD-10-CM | POA: Diagnosis not present

## 2020-12-25 DIAGNOSIS — C182 Malignant neoplasm of ascending colon: Secondary | ICD-10-CM

## 2020-12-25 MED ORDER — HEPARIN SOD (PORK) LOCK FLUSH 100 UNIT/ML IV SOLN
500.0000 [IU] | Freq: Once | INTRAVENOUS | Status: AC | PRN
  Administered 2020-12-25: 500 [IU]
  Filled 2020-12-25: qty 5

## 2020-12-25 MED ORDER — SODIUM CHLORIDE 0.9 % IV SOLN
Freq: Once | INTRAVENOUS | Status: AC
  Filled 2020-12-25: qty 250

## 2020-12-25 MED ORDER — SODIUM CHLORIDE 0.9% FLUSH
10.0000 mL | INTRAVENOUS | Status: DC | PRN
  Administered 2020-12-25: 10 mL
  Filled 2020-12-25: qty 10

## 2020-12-25 NOTE — Patient Instructions (Signed)

## 2020-12-25 NOTE — Progress Notes (Signed)
Spoke with Lenna Sciara, np about patients BP and patient states she is feeling better and can come tomorrow for more fluids.

## 2020-12-26 ENCOUNTER — Inpatient Hospital Stay: Payer: PRIVATE HEALTH INSURANCE

## 2020-12-26 ENCOUNTER — Other Ambulatory Visit: Payer: Self-pay | Admitting: Hematology and Oncology

## 2020-12-26 VITALS — BP 107/73 | HR 93 | Temp 97.5°F | Resp 18 | Ht 68.0 in | Wt 199.0 lb

## 2020-12-26 DIAGNOSIS — R197 Diarrhea, unspecified: Secondary | ICD-10-CM

## 2020-12-26 DIAGNOSIS — E860 Dehydration: Secondary | ICD-10-CM

## 2020-12-26 DIAGNOSIS — Z5111 Encounter for antineoplastic chemotherapy: Secondary | ICD-10-CM | POA: Diagnosis not present

## 2020-12-26 MED ORDER — SODIUM CHLORIDE 0.9 % IV SOLN
Freq: Once | INTRAVENOUS | Status: AC
  Filled 2020-12-26: qty 250

## 2020-12-26 MED ORDER — SODIUM CHLORIDE 0.9% FLUSH
10.0000 mL | Freq: Once | INTRAVENOUS | Status: AC | PRN
  Administered 2020-12-26: 10 mL
  Filled 2020-12-26: qty 10

## 2020-12-26 MED ORDER — HEPARIN SOD (PORK) LOCK FLUSH 100 UNIT/ML IV SOLN
250.0000 [IU] | Freq: Once | INTRAVENOUS | Status: DC | PRN
  Filled 2020-12-26: qty 5

## 2020-12-26 MED ORDER — HEPARIN SOD (PORK) LOCK FLUSH 100 UNIT/ML IV SOLN
500.0000 [IU] | Freq: Once | INTRAVENOUS | Status: AC | PRN
  Administered 2020-12-26: 500 [IU]
  Filled 2020-12-26: qty 5

## 2020-12-26 MED ORDER — SODIUM CHLORIDE 0.9% FLUSH
3.0000 mL | Freq: Once | INTRAVENOUS | Status: DC | PRN
  Filled 2020-12-26: qty 10

## 2020-12-26 NOTE — Addendum Note (Signed)
Addended by: Juanetta Beets on: 12/26/2020 12:48 PM   Modules accepted: Orders

## 2020-12-26 NOTE — Patient Instructions (Signed)

## 2020-12-26 NOTE — Progress Notes (Signed)
PT STABLE AT TIME OF DISCHARGE 

## 2021-01-03 ENCOUNTER — Encounter: Payer: Self-pay | Admitting: Hematology and Oncology

## 2021-01-03 ENCOUNTER — Telehealth: Payer: Self-pay | Admitting: Hematology and Oncology

## 2021-01-03 ENCOUNTER — Inpatient Hospital Stay: Payer: PRIVATE HEALTH INSURANCE | Attending: Oncology

## 2021-01-03 ENCOUNTER — Other Ambulatory Visit: Payer: Self-pay

## 2021-01-03 ENCOUNTER — Inpatient Hospital Stay (INDEPENDENT_AMBULATORY_CARE_PROVIDER_SITE_OTHER): Payer: PRIVATE HEALTH INSURANCE | Admitting: Hematology and Oncology

## 2021-01-03 VITALS — BP 119/72 | HR 105 | Temp 98.5°F | Resp 18 | Ht 68.0 in | Wt 197.2 lb

## 2021-01-03 DIAGNOSIS — K521 Toxic gastroenteritis and colitis: Secondary | ICD-10-CM | POA: Insufficient documentation

## 2021-01-03 DIAGNOSIS — C182 Malignant neoplasm of ascending colon: Secondary | ICD-10-CM

## 2021-01-03 DIAGNOSIS — E119 Type 2 diabetes mellitus without complications: Secondary | ICD-10-CM | POA: Insufficient documentation

## 2021-01-03 DIAGNOSIS — C7800 Secondary malignant neoplasm of unspecified lung: Secondary | ICD-10-CM | POA: Insufficient documentation

## 2021-01-03 DIAGNOSIS — Z5112 Encounter for antineoplastic immunotherapy: Secondary | ICD-10-CM | POA: Insufficient documentation

## 2021-01-03 DIAGNOSIS — Z90710 Acquired absence of both cervix and uterus: Secondary | ICD-10-CM | POA: Insufficient documentation

## 2021-01-03 DIAGNOSIS — C189 Malignant neoplasm of colon, unspecified: Secondary | ICD-10-CM | POA: Insufficient documentation

## 2021-01-03 DIAGNOSIS — C7971 Secondary malignant neoplasm of right adrenal gland: Secondary | ICD-10-CM | POA: Insufficient documentation

## 2021-01-03 DIAGNOSIS — E860 Dehydration: Secondary | ICD-10-CM | POA: Insufficient documentation

## 2021-01-03 DIAGNOSIS — D701 Agranulocytosis secondary to cancer chemotherapy: Secondary | ICD-10-CM | POA: Insufficient documentation

## 2021-01-03 DIAGNOSIS — C7931 Secondary malignant neoplasm of brain: Secondary | ICD-10-CM | POA: Insufficient documentation

## 2021-01-03 DIAGNOSIS — T451X5A Adverse effect of antineoplastic and immunosuppressive drugs, initial encounter: Secondary | ICD-10-CM | POA: Insufficient documentation

## 2021-01-03 LAB — BASIC METABOLIC PANEL
BUN: 12 (ref 4–21)
CO2: 20 (ref 13–22)
Chloride: 103 (ref 99–108)
Creatinine: 1.3 — AB (ref 0.5–1.1)
Glucose: 238
Potassium: 4.1 (ref 3.4–5.3)
Sodium: 137 (ref 137–147)

## 2021-01-03 LAB — HEPATIC FUNCTION PANEL
ALT: 34 (ref 7–35)
AST: 37 — AB (ref 13–35)
Alkaline Phosphatase: 152 — AB (ref 25–125)
Bilirubin, Total: 0.4

## 2021-01-03 LAB — COMPREHENSIVE METABOLIC PANEL
Albumin: 4.2 (ref 3.5–5.0)
Calcium: 8.9 (ref 8.7–10.7)

## 2021-01-03 LAB — CBC AND DIFFERENTIAL
HCT: 36 (ref 36–46)
Hemoglobin: 12 (ref 12.0–16.0)
Neutrophils Absolute: 2.01
Platelets: 155 (ref 150–399)
WBC: 3.4

## 2021-01-03 LAB — CBC: RBC: 3.63 — AB (ref 3.87–5.11)

## 2021-01-03 NOTE — Telephone Encounter (Signed)
Per 8/5 los next appt scheduled and given to patient 

## 2021-01-03 NOTE — Progress Notes (Signed)
[facility]  [address] [place],  [place]  [address] [phone]  Clinic Day:  12/20/2020  Referring physician: Greig Right, MD  HISTORY OF PRESENT ILLNESS:  The patient is a 55 y.o. female with metastatic colon cancer, which includes spread of disease to her abdominal cavity, lungs, brain and right adrenal gland here for 2 week evaluation prior to cycle 12 FOLFIRI/ bevacizumab. With respect to her colon cancer history, she is status post a right hemicolectomy in early October 2018, followed by 12 cycles of adjuvant chemotherapy, which were completed in April 2019.  In December 2021, she underwent a left cerebellar metastatectomy, whose pathology was consistent with metastatic colon cancer.  Tumor testing did come back MMR normal.  CT scans recently showed evidence of her cancer being in multiple locations, which has her on palliative chemotherapy at this time.  Today she states feeling better. She did have episodes of decreased appetite, nausea and diarrhea with her last treatment. She denies fever, chills, nausea or vomiting heading into her next cycle. She denies shortness of breath, chest pain or cough. She denies further issues with bowel or bladder. CBC and CMP are unremarkable today.  PHYSICAL EXAM:  Blood pressure 119/72, pulse (!) 105, temperature 98.5 F (36.9 C), temperature source Oral, resp. rate 18, height _0  (1.727 m), weight 197 lb 3.2 oz (89.4 kg), last menstrual period 02/22/2019, SpO2 96 %. Wt Readings from Last 3 Encounters:  01/03/21 197 lb 3.2 oz (89.4 kg)  12/26/20 199 lb (90.3 kg)  12/25/20 200 lb (90.7 kg)   Body mass index is 29.98 kg/m. Performance status (ECOG): 1 Physical Exam Constitutional:      Appearance: Normal appearance. She is not ill-appearing.  HENT:     Mouth/Throat:     Mouth: Mucous membranes are moist.     Pharynx: Oropharynx is clear. No oropharyngeal exudate or posterior oropharyngeal erythema.   Cardiovascular:     Rate and Rhythm: Normal rate and regular rhythm.     Heart sounds: No murmur heard.   No friction rub. No gallop.  Pulmonary:     Effort: Pulmonary effort is normal. No respiratory distress.     Breath sounds: Normal breath sounds. No wheezing, rhonchi or rales.  Chest:  Breasts:    Right: No axillary adenopathy or supraclavicular adenopathy.     Left: No axillary adenopathy or supraclavicular adenopathy.  Abdominal:     General: Bowel sounds are normal. There is no distension.     Palpations: Abdomen is soft. There is no mass.     Tenderness: There is no abdominal tenderness.  Musculoskeletal:        General: No swelling.     Right lower leg: No edema.     Left lower leg: No edema.  Lymphadenopathy:     Cervical: No cervical adenopathy.     Upper Body:     Right upper body: No supraclavicular or axillary adenopathy.     Left upper body: No supraclavicular or axillary adenopathy.     Lower Body: No right inguinal adenopathy. No left inguinal adenopathy.  Skin:    General: Skin is warm.     Coloration: Skin is not jaundiced.     Findings: No lesion or rash.  Neurological:     General: No focal deficit present.     Mental Status: She is alert and oriented to person, place, and time. Mental status is at baseline.     Cranial Nerves: Cranial  nerves are intact.  Psychiatric:        Mood and Affect: Mood normal.        Behavior: Behavior normal.        Thought Content: Thought content normal.   LABS:  Results for SHADAWN, HANAWAY (MRN [id number]) as of 01/03/2021 14:41  Ref. Range 01/03/2021 00:00  Sodium Latest Ref Range: 137 - 147  137  Potassium Latest Ref Range: 3.4 - 5.3  4.1  Chloride Latest Ref Range: 99 - 108  103  CO2 Latest Ref Range: 13 - 22  20  Glucose Unknown 238  BUN Latest Ref Range: 4 - 21  12  Creatinine Latest Ref Range: 0.5 - 1.1  1.3 (A)  Calcium Latest Ref Range: 8.7 - 10.7  8.9  Alkaline Phosphatase Latest Ref Range: 25 - 125   152 (A)  Albumin Latest Ref Range: 3.5 - 5.0  4.2  AST Latest Ref Range: 13 - 35  37 (A)  ALT Latest Ref Range: 7 - 35  34  Bilirubin, Total Unknown 0.4  WBC Unknown 3.4  RBC Latest Ref Range: 3.87 - 5.11  3.63 (A)  Hemoglobin Latest Ref Range: 12.0 - 16.0  12.0  HCT Latest Ref Range: 36 - 46  36  Platelets Latest Ref Range: 150 - 399  155  NEUT# Unknown 2.01     ASSESSMENT & PLAN:  Assessment/Plan:  A 55 y.o. female with metastatic colon cancer.  She will proceed with her 12th cycle of FOLFIRI/Avastin next week. She is scheduled for MRI brain and follow up with Dr. Mickeal Skinner prior to next cycle of treatment. She will return in 2 weeks for repeat evaluation with Dr. Bobby Rumpf.   Both she and her mother verbalizes understanding of and agreement to the plans discussed today. They know to call the office with any new questions or concerns.   Dayton Scrape, FNP- [facility]

## 2021-01-07 ENCOUNTER — Other Ambulatory Visit: Payer: Self-pay

## 2021-01-07 ENCOUNTER — Encounter: Payer: Self-pay | Admitting: Oncology

## 2021-01-07 ENCOUNTER — Inpatient Hospital Stay: Payer: PRIVATE HEALTH INSURANCE

## 2021-01-07 VITALS — BP 129/95 | HR 116 | Temp 97.5°F | Resp 18 | Ht 68.0 in | Wt 194.0 lb

## 2021-01-07 DIAGNOSIS — E860 Dehydration: Secondary | ICD-10-CM | POA: Diagnosis not present

## 2021-01-07 DIAGNOSIS — Z90710 Acquired absence of both cervix and uterus: Secondary | ICD-10-CM | POA: Diagnosis not present

## 2021-01-07 DIAGNOSIS — C7971 Secondary malignant neoplasm of right adrenal gland: Secondary | ICD-10-CM | POA: Diagnosis not present

## 2021-01-07 DIAGNOSIS — C182 Malignant neoplasm of ascending colon: Secondary | ICD-10-CM

## 2021-01-07 DIAGNOSIS — Z5112 Encounter for antineoplastic immunotherapy: Secondary | ICD-10-CM | POA: Diagnosis present

## 2021-01-07 DIAGNOSIS — C189 Malignant neoplasm of colon, unspecified: Secondary | ICD-10-CM | POA: Diagnosis present

## 2021-01-07 DIAGNOSIS — K521 Toxic gastroenteritis and colitis: Secondary | ICD-10-CM | POA: Diagnosis not present

## 2021-01-07 DIAGNOSIS — C7931 Secondary malignant neoplasm of brain: Secondary | ICD-10-CM | POA: Diagnosis present

## 2021-01-07 DIAGNOSIS — T451X5A Adverse effect of antineoplastic and immunosuppressive drugs, initial encounter: Secondary | ICD-10-CM | POA: Diagnosis not present

## 2021-01-07 DIAGNOSIS — C7800 Secondary malignant neoplasm of unspecified lung: Secondary | ICD-10-CM | POA: Diagnosis not present

## 2021-01-07 DIAGNOSIS — D701 Agranulocytosis secondary to cancer chemotherapy: Secondary | ICD-10-CM | POA: Diagnosis not present

## 2021-01-07 DIAGNOSIS — E119 Type 2 diabetes mellitus without complications: Secondary | ICD-10-CM | POA: Diagnosis not present

## 2021-01-07 MED ORDER — DEXTROSE 5 % IV SOLN
400.0000 mg/m2 | Freq: Once | INTRAVENOUS | Status: AC
  Administered 2021-01-07: 836 mg via INTRAVENOUS
  Filled 2021-01-07: qty 41.8

## 2021-01-07 MED ORDER — PALONOSETRON HCL INJECTION 0.25 MG/5ML
INTRAVENOUS | Status: AC
  Filled 2021-01-07: qty 5

## 2021-01-07 MED ORDER — SODIUM CHLORIDE 0.9 % IV SOLN
Freq: Once | INTRAVENOUS | Status: AC
  Filled 2021-01-07: qty 250

## 2021-01-07 MED ORDER — LORAZEPAM 2 MG/ML IJ SOLN
0.5000 mg | Freq: Once | INTRAMUSCULAR | Status: AC
  Administered 2021-01-07: 0.5 mg via INTRAVENOUS

## 2021-01-07 MED ORDER — PALONOSETRON HCL INJECTION 0.25 MG/5ML
0.2500 mg | Freq: Once | INTRAVENOUS | Status: AC
  Administered 2021-01-07: 0.25 mg via INTRAVENOUS

## 2021-01-07 MED ORDER — SODIUM CHLORIDE 0.9 % IV SOLN
5.0000 mg/kg | Freq: Once | INTRAVENOUS | Status: AC
  Administered 2021-01-07: 450 mg via INTRAVENOUS
  Filled 2021-01-07: qty 16

## 2021-01-07 MED ORDER — FLUOROURACIL CHEMO INJECTION 2.5 GM/50ML
400.0000 mg/m2 | Freq: Once | INTRAVENOUS | Status: AC
  Administered 2021-01-07: 850 mg via INTRAVENOUS
  Filled 2021-01-07: qty 17

## 2021-01-07 MED ORDER — HEPARIN SOD (PORK) LOCK FLUSH 100 UNIT/ML IV SOLN
500.0000 [IU] | Freq: Once | INTRAVENOUS | Status: DC | PRN
  Filled 2021-01-07: qty 5

## 2021-01-07 MED ORDER — SODIUM CHLORIDE 0.9 % IV SOLN
150.0000 mg | Freq: Once | INTRAVENOUS | Status: AC
  Administered 2021-01-07: 150 mg via INTRAVENOUS
  Filled 2021-01-07: qty 150

## 2021-01-07 MED ORDER — SODIUM CHLORIDE 0.9% FLUSH
10.0000 mL | INTRAVENOUS | Status: DC | PRN
  Filled 2021-01-07: qty 10

## 2021-01-07 MED ORDER — ATROPINE SULFATE 1 MG/ML IJ SOLN
0.5000 mg | Freq: Once | INTRAMUSCULAR | Status: AC | PRN
  Administered 2021-01-07: 0.5 mg via INTRAVENOUS

## 2021-01-07 MED ORDER — ATROPINE SULFATE 1 MG/ML IJ SOLN
INTRAMUSCULAR | Status: AC
  Filled 2021-01-07: qty 1

## 2021-01-07 MED ORDER — LORAZEPAM 2 MG/ML IJ SOLN
INTRAMUSCULAR | Status: AC
  Filled 2021-01-07: qty 1

## 2021-01-07 MED ORDER — SODIUM CHLORIDE 0.9 % IV SOLN
2400.0000 mg/m2 | INTRAVENOUS | Status: DC
  Administered 2021-01-07: 5000 mg via INTRAVENOUS
  Filled 2021-01-07: qty 100

## 2021-01-07 MED ORDER — IRINOTECAN HCL CHEMO INJECTION 100 MG/5ML
135.0000 mg/m2 | Freq: Once | INTRAVENOUS | Status: AC
  Administered 2021-01-07: 280 mg via INTRAVENOUS
  Filled 2021-01-07: qty 14

## 2021-01-07 MED ORDER — SODIUM CHLORIDE 0.9 % IV SOLN
10.0000 mg | Freq: Once | INTRAVENOUS | Status: AC
  Administered 2021-01-07: 10 mg via INTRAVENOUS
  Filled 2021-01-07: qty 10

## 2021-01-07 NOTE — Progress Notes (Signed)
Discharged home, stable  

## 2021-01-07 NOTE — Patient Instructions (Addendum)
[place]  Discharge Instructions: Thank you for choosing [facility] to provide your oncology and hematology care.  If you have a lab appointment with the [facility], please go directly to the [facility] and check in at the registration area.   Wear comfortable clothing and clothing appropriate for easy access to any Portacath or PICC line.   We strive to give you quality time with your provider. You may need to reschedule your appointment if you arrive late (15 or more minutes).  Arriving late affects you and other patients whose appointments are after yours.  Also, if you miss three or more appointments without notifying the office, you may be dismissed from the clinic at the provider's discretion.      For prescription refill requests, have your pharmacy contact our office and allow 72 hours for refills to be completed.    Today you received the following chemotherapy and/or immunotherapy agents:Zirabev, Camptosar, Leucovorin, Bevacizumab injection What is this medication? BEVACIZUMAB (be va SIZ yoo mab) is a monoclonal antibody. It is used to treatmany types of cancer. This medicine may be used for other purposes; ask your health care provider orpharmacist if you have questions. COMMON BRAND NAME(S): Avastin, MVASI, Noah Charon What should I tell my care team before I take this medication? They need to know if you have any of these conditions: diabetes heart disease high blood pressure history of coughing up blood prior anthracycline chemotherapy (e.g., doxorubicin, daunorubicin, epirubicin) recent or ongoing radiation therapy recent or planning to have surgery stroke an unusual or allergic reaction to bevacizumab, hamster proteins, mouse proteins, other medicines, foods, dyes, or preservatives pregnant or trying to get pregnant breast-feeding How should I use this medication? This medicine is for infusion into a vein. It is given by a  health careprofessional in a hospital or clinic setting. Talk to your pediatrician regarding the use of this medicine in children.Special care may be needed. Overdosage: If you think you have taken too much of this medicine contact apoison control center or emergency room at once. NOTE: This medicine is only for you. Do not share this medicine with others. What if I miss a dose? It is important not to miss your dose. Call your doctor or health careprofessional if you are unable to keep an appointment. What may interact with this medication? Interactions are not expected. This list may not describe all possible interactions. Give your health care provider a list of all the medicines, herbs, non-prescription drugs, or dietary supplements you use. Also tell them if you smoke, drink alcohol, or use illegaldrugs. Some items may interact with your medicine. What should I watch for while using this medication? Your condition will be monitored carefully while you are receiving this medicine. You will need important blood work and urine testing done while youare taking this medicine. This medicine may increase your risk to bruise or bleed. Call your doctor orhealth care professional if you notice any unusual bleeding. Before having surgery, talk to your health care provider to make sure it is ok. This drug can increase the risk of poor healing of your surgical site or wound. You will need to stop this drug for 28 days before surgery. After surgery, wait at least 28 days before restarting this drug. Make sure the surgical site or wound is healed enough before restarting this drug. Talk to your health careprovider if questions. Do not become pregnant while taking this medicine or for 6 months after  stopping it. Women should inform their doctor if they wish to become pregnant or think they might be pregnant. There is a potential for serious side effects to an unborn child. Talk to your health care professional or  pharmacist for more information. Do not breast-feed an infant while taking this medicine andfor 6 months after the last dose. This medicine has caused ovarian failure in some women. This medicine may interfere with the ability to have a child. You should talk to your doctor orhealth care professional if you are concerned about your fertility. What side effects may I notice from receiving this medication? Side effects that you should report to your doctor or health care professionalas soon as possible: allergic reactions like skin rash, itching or hives, swelling of the face, lips, or tongue chest pain or chest tightness chills coughing up blood high fever seizures severe constipation signs and symptoms of bleeding such as bloody or black, tarry stools; red or dark-brown urine; spitting up blood or brown material that looks like coffee grounds; red spots on the skin; unusual bruising or bleeding from the eye, gums, or nose signs and symptoms of a blood clot such as breathing problems; chest pain; severe, sudden headache; pain, swelling, warmth in the leg signs and symptoms of a stroke like changes in vision; confusion; trouble speaking or understanding; severe headaches; sudden numbness or weakness of the face, arm or leg; trouble walking; dizziness; loss of balance or coordination stomach pain sweating swelling of legs or ankles vomiting weight gain Side effects that usually do not require medical attention (report to yourdoctor or health care professional if they continue or are bothersome): back pain changes in taste decreased appetite dry skin nausea tiredness This list may not describe all possible side effects. Call your doctor for medical advice about side effects. You may report side effects to FDA at1-800-FDA-1088. Where should I keep my medication? This drug is given in a hospital or clinic and will not be stored at home. NOTE: This sheet is a summary. It may not cover all  possible information. If you have questions about this medicine, talk to your doctor, pharmacist, orhealth care provider.  2022 Elsevier/Gold Standard (2019-03-15 10:50:46)  Fluorouracil, 5-FU injection What is this medication? FLUOROURACIL, 5-FU (flure oh YOOR a sil) is a chemotherapy drug. It slows the growth of cancer cells. This medicine is used to treat many types of cancer like breast cancer, colon or rectal cancer, pancreatic cancer, and stomachcancer. This medicine may be used for other purposes; ask your health care provider orpharmacist if you have questions. COMMON BRAND NAME(S): Adrucil What should I tell my care team before I take this medication? They need to know if you have any of these conditions: blood disorders dihydropyrimidine dehydrogenase (DPD) deficiency infection (especially a virus infection such as chickenpox, cold sores, or herpes) kidney disease liver disease malnourished, poor nutrition recent or ongoing radiation therapy an unusual or allergic reaction to fluorouracil, other chemotherapy, other medicines, foods, dyes, or preservatives pregnant or trying to get pregnant breast-feeding How should I use this medication? This drug is given as an infusion or injection into a vein. It is administeredin a hospital or clinic by a specially trained health care professional. Talk to your pediatrician regarding the use of this medicine in children.Special care may be needed. Overdosage: If you think you have taken too much of this medicine contact apoison control center or emergency room at once. NOTE: This medicine is only for you. Do not share this  medicine with others. What if I miss a dose? It is important not to miss your dose. Call your doctor or health careprofessional if you are unable to keep an appointment. What may interact with this medication? Do not take this medicine with any of the following medications: live virus vaccines This medicine may also  interact with the following medications: medicines that treat or prevent blood clots like warfarin, enoxaparin, and dalteparin This list may not describe all possible interactions. Give your health care provider a list of all the medicines, herbs, non-prescription drugs, or dietary supplements you use. Also tell them if you smoke, drink alcohol, or use illegaldrugs. Some items may interact with your medicine. What should I watch for while using this medication? Visit your doctor for checks on your progress. This drug may make you feel generally unwell. This is not uncommon, as chemotherapy can affect healthy cells as well as cancer cells. Report any side effects. Continue your course oftreatment even though you feel ill unless your doctor tells you to stop. In some cases, you may be given additional medicines to help with side effects.Follow all directions for their use. Call your doctor or health care professional for advice if you get a fever, chills or sore throat, or other symptoms of a cold or flu. Do not treat yourself. This drug decreases your body's ability to fight infections. Try toavoid being around people who are sick. This medicine may increase your risk to bruise or bleed. Call your doctor orhealth care professional if you notice any unusual bleeding. Be careful brushing and flossing your teeth or using a toothpick because you may get an infection or bleed more easily. If you have any dental work done,tell your dentist you are receiving this medicine. Avoid taking products that contain aspirin, acetaminophen, ibuprofen, naproxen, or ketoprofen unless instructed by your doctor. These medicines may hide afever. Do not become pregnant while taking this medicine. Women should inform their doctor if they wish to become pregnant or think they might be pregnant. There is a potential for serious side effects to an unborn child. Talk to your health care professional or pharmacist for more information.  Do not breast-feed aninfant while taking this medicine. Men should inform their doctor if they wish to father a child. This medicinemay lower sperm counts. Do not treat diarrhea with over the counter products. Contact your doctor ifyou have diarrhea that lasts more than 2 days or if it is severe and watery. This medicine can make you more sensitive to the sun. Keep out of the sun. If you cannot avoid being in the sun, wear protective clothing and use sunscreen.Do not use sun lamps or tanning beds/booths. What side effects may I notice from receiving this medication? Side effects that you should report to your doctor or health care professionalas soon as possible: allergic reactions like skin rash, itching or hives, swelling of the face, lips, or tongue low blood counts - this medicine may decrease the number of white blood cells, red blood cells and platelets. You may be at increased risk for infections and bleeding. signs of infection - fever or chills, cough, sore throat, pain or difficulty passing urine signs of decreased platelets or bleeding - bruising, pinpoint red spots on the skin, black, tarry stools, blood in the urine signs of decreased red blood cells - unusually weak or tired, fainting spells, lightheadedness breathing problems changes in vision chest pain mouth sores nausea and vomiting pain, swelling, redness at site where injected pain,  tingling, numbness in the hands or feet redness, swelling, or sores on hands or feet stomach pain unusual bleeding Side effects that usually do not require medical attention (report to yourdoctor or health care professional if they continue or are bothersome): changes in finger or toe nails diarrhea dry or itchy skin hair loss headache loss of appetite sensitivity of eyes to the light stomach upset unusually teary eyes This list may not describe all possible side effects. Call your doctor for medical advice about side effects. You may  report side effects to FDA at1-800-FDA-1088. Where should I keep my medication? This drug is given in a hospital or clinic and will not be stored at home. NOTE: This sheet is a summary. It may not cover all possible information. If you have questions about this medicine, talk to your doctor, pharmacist, orhealth care provider.  2022 Elsevier/Gold Standard (2019-04-18 15:00:03) Leucovorin injection What is this medication? LEUCOVORIN (loo koe VOR in) is used to prevent or treat the harmful effects of some medicines. This medicine is used to treat anemia caused by a low amount of folic acid in the body. It is also used with 5-fluorouracil (5-FU) to treatcolon cancer. This medicine may be used for other purposes; ask your health care provider orpharmacist if you have questions. What should I tell my care team before I take this medication? They need to know if you have any of these conditions: anemia from low levels of vitamin B-12 in the blood an unusual or allergic reaction to leucovorin, folic acid, other medicines, foods, dyes, or preservatives pregnant or trying to get pregnant breast-feeding How should I use this medication? This medicine is for injection into a muscle or into a vein. It is given by ahealth care professional in a hospital or clinic setting. Talk to your pediatrician regarding the use of this medicine in children.Special care may be needed. Overdosage: If you think you have taken too much of this medicine contact apoison control center or emergency room at once. NOTE: This medicine is only for you. Do not share this medicine with others. What if I miss a dose? This does not apply. What may interact with this medication? capecitabine fluorouracil phenobarbital phenytoin primidone trimethoprim-sulfamethoxazole This list may not describe all possible interactions. Give your health care provider a list of all the medicines, herbs, non-prescription drugs, or dietary  supplements you use. Also tell them if you smoke, drink alcohol, or use illegaldrugs. Some items may interact with your medicine. What should I watch for while using this medication? Your condition will be monitored carefully while you are receiving thismedicine. This medicine may increase the side effects of 5-fluorouracil, 5-FU. Tell your doctor or health care professional if you have diarrhea or mouth sores that donot get better or that get worse. What side effects may I notice from receiving this medication? Side effects that you should report to your doctor or health care professionalas soon as possible: allergic reactions like skin rash, itching or hives, swelling of the face, lips, or tongue breathing problems fever, infection mouth sores unusual bleeding or bruising unusually weak or tired Side effects that usually do not require medical attention (report to yourdoctor or health care professional if they continue or are bothersome): constipation or diarrhea loss of appetite nausea, vomiting This list may not describe all possible side effects. Call your doctor for medical advice about side effects. You may report side effects to FDA at1-800-FDA-1088. Where should I keep my medication? This drug is given in  a hospital or clinic and will not be stored at home. NOTE: This sheet is a summary. It may not cover all possible information. If you have questions about this medicine, talk to your doctor, pharmacist, orhealth care provider.  2022 Elsevier/Gold Standard (2007-11-22 16:50:29) Irinotecan injection What is this medication? IRINOTECAN (ir in oh TEE kan ) is a chemotherapy drug. It is used to treatcolon and rectal cancer. This medicine may be used for other purposes; ask your health care provider orpharmacist if you have questions. COMMON BRAND NAME(S): Camptosar What should I tell my care team before I take this medication? They need to know if you have any of these  conditions: dehydration diarrhea infection (especially a virus infection such as chickenpox, cold sores, or herpes) liver disease low blood counts, like low white cell, platelet, or red cell counts low levels of calcium, magnesium, or potassium in the blood recent or ongoing radiation therapy an unusual or allergic reaction to irinotecan, other medicines, foods, dyes, or preservatives pregnant or trying to get pregnant breast-feeding How should I use this medication? This drug is given as an infusion into a vein. It is administered in a hospitalor clinic by a specially trained health care professional. Talk to your pediatrician regarding the use of this medicine in children.Special care may be needed. Overdosage: If you think you have taken too much of this medicine contact apoison control center or emergency room at once. NOTE: This medicine is only for you. Do not share this medicine with others. What if I miss a dose? It is important not to miss your dose. Call your doctor or health careprofessional if you are unable to keep an appointment. What may interact with this medication? Do not take this medicine with any of the following medications: cobicistat itraconazole This medicine may interact with the following medications: antiviral medicines for HIV or AIDS certain antibiotics like rifampin or rifabutin certain medicines for fungal infections like ketoconazole, posaconazole, and voriconazole certain medicines for seizures like carbamazepine, phenobarbital, phenotoin clarithromycin gemfibrozil nefazodone [facility]. John's Wort This list may not describe all possible interactions. Give your health care provider a list of all the medicines, herbs, non-prescription drugs, or dietary supplements you use. Also tell them if you smoke, drink alcohol, or use illegaldrugs. Some items may interact with your medicine. What should I watch for while using this medication? Your condition will be  monitored carefully while you are receiving this medicine. You will need important blood work done while you are taking thismedicine. This drug may make you feel generally unwell. This is not uncommon, as chemotherapy can affect healthy cells as well as cancer cells. Report any side effects. Continue your course of treatment even though you feel ill unless yourdoctor tells you to stop. In some cases, you may be given additional medicines to help with side effects.Follow all directions for their use. You may get drowsy or dizzy. Do not drive, use machinery, or do anything that needs mental alertness until you know how this medicine affects you. Do not stand or sit up quickly, especially if you are an older patient. This reducesthe risk of dizzy or fainting spells. Call your health care professional for advice if you get a fever, chills, or sore throat, or other symptoms of a cold or flu. Do not treat yourself. This medicine decreases your body's ability to fight infections. Try to avoid beingaround people who are sick. Avoid taking products that contain aspirin, acetaminophen, ibuprofen, naproxen, or ketoprofen unless instructed by your  doctor. These medicines may hide afever. This medicine may increase your risk to bruise or bleed. Call your doctor orhealth care professional if you notice any unusual bleeding. Be careful brushing and flossing your teeth or using a toothpick because you may get an infection or bleed more easily. If you have any dental work done,tell your dentist you are receiving this medicine. Do not become pregnant while taking this medicine or for 6 months after stopping it. Women should inform their health care professional if they wish to become pregnant or think they might be pregnant. Men should not father a child while taking this medicine and for 3 months after stopping it. There is potential for serious side effects to an unborn child. Talk to your health careprofessional for more  information. Do not breast-feed an infant while taking this medicine or for 7 days afterstopping it. This medicine has caused ovarian failure in some women. This medicine may make it more difficult to get pregnant. Talk to your health care professional if [place] concerned about your fertility. This medicine has caused decreased sperm counts in some men. This may make it more difficult to father a child. Talk to your health care professional if [place] concerned about your fertility. What side effects may I notice from receiving this medication? Side effects that you should report to your doctor or health care professionalas soon as possible: allergic reactions like skin rash, itching or hives, swelling of the face, lips, or tongue chest pain diarrhea flushing, runny nose, sweating during infusion low blood counts - this medicine may decrease the number of white blood cells, red blood cells and platelets. You may be at increased risk for infections and bleeding. nausea, vomiting pain, swelling, warmth in the leg signs of decreased platelets or bleeding - bruising, pinpoint red spots on the skin, black, tarry stools, blood in the urine signs of infection - fever or chills, cough, sore throat, pain or difficulty passing urine signs of decreased red blood cells - unusually weak or tired, fainting spells, lightheadedness Side effects that usually do not require medical attention (report to yourdoctor or health care professional if they continue or are bothersome): constipation hair loss headache loss of appetite mouth sores stomach pain This list may not describe all possible side effects. Call your doctor for medical advice about side effects. You may report side effects to FDA at1-800-FDA-1088. Where should I keep my medication? This drug is given in a hospital or clinic and will not be stored at home. NOTE: This sheet is a summary. It may not cover all possible information. If you have  questions about this medicine, talk to your doctor, pharmacist, orhealth care provider.  2022 Elsevier/Gold Standard (2019-04-18 17:46:13)    To help prevent nausea and vomiting after your treatment, we encourage you to take your nausea medication as directed.  BELOW ARE SYMPTOMS THAT SHOULD BE REPORTED IMMEDIATELY: *FEVER GREATER THAN 100.4 F (38 C) OR HIGHER *CHILLS OR SWEATING *NAUSEA AND VOMITING THAT IS NOT CONTROLLED WITH YOUR NAUSEA MEDICATION *UNUSUAL SHORTNESS OF BREATH *UNUSUAL BRUISING OR BLEEDING *URINARY PROBLEMS (pain or burning when urinating, or frequent urination) *BOWEL PROBLEMS (unusual diarrhea, constipation, pain near the anus) TENDERNESS IN MOUTH AND THROAT WITH OR WITHOUT PRESENCE OF ULCERS (sore throat, sores in mouth, or a toothache) UNUSUAL RASH, SWELLING OR PAIN  UNUSUAL VAGINAL DISCHARGE OR ITCHING   Items with * indicate a potential emergency and should be followed up as soon as possible or go to the Emergency Department  if any problems should occur.  Please show the CHEMOTHERAPY ALERT CARD or IMMUNOTHERAPY ALERT CARD at check-in to the Emergency Department and triage nurse.  Should you have questions after your visit or need to cancel or reschedule your appointment, please contact [facility]  Dept: [phone]  and follow the prompts.  Office hours are 8:00 a.m. to 4:30 p.m. Monday - Friday. Please note that voicemails left after 4:00 p.m. may not be returned until the following business day.  We are closed weekends and major holidays. You have access to a nurse at all times for urgent questions. Please call the main number to the clinic Dept: [phone] and follow the prompts.  For any non-urgent questions, you may also contact your provider using MyChart. We now offer e-Visits for anyone 10 and older to request care online for non-urgent symptoms. For details visit [email or web address].   Also download the MyChart app! Go to  the app store, search "MyChart", open the app, select [facility], and log in with your MyChart username and password.  Due to Covid, a mask is required upon entering the hospital/clinic. If you do not have a mask, one will be given to you upon arrival. For doctor visits, patients may have 1 support person aged 55 or older with them. For treatment visits, patients cannot have anyone with them due to current Covid guidelines and our immunocompromised population.

## 2021-01-09 ENCOUNTER — Other Ambulatory Visit: Payer: Self-pay

## 2021-01-09 ENCOUNTER — Inpatient Hospital Stay: Payer: PRIVATE HEALTH INSURANCE

## 2021-01-09 VITALS — BP 102/82 | HR 89 | Temp 97.7°F | Resp 18 | Wt 198.0 lb

## 2021-01-09 DIAGNOSIS — C182 Malignant neoplasm of ascending colon: Secondary | ICD-10-CM

## 2021-01-09 DIAGNOSIS — Z5112 Encounter for antineoplastic immunotherapy: Secondary | ICD-10-CM | POA: Diagnosis not present

## 2021-01-09 MED ORDER — SODIUM CHLORIDE 0.9% FLUSH
10.0000 mL | INTRAVENOUS | Status: DC | PRN
  Administered 2021-01-09: 10 mL
  Filled 2021-01-09: qty 10

## 2021-01-09 MED ORDER — HEPARIN SOD (PORK) LOCK FLUSH 100 UNIT/ML IV SOLN
500.0000 [IU] | Freq: Once | INTRAVENOUS | Status: AC | PRN
  Administered 2021-01-09: 500 [IU]
  Filled 2021-01-09: qty 5

## 2021-01-09 NOTE — Progress Notes (Signed)
Discharged home stable  

## 2021-01-09 NOTE — Patient Instructions (Signed)
Patient transported to appointment today 01/09/2021 at 3pm by daughter. Erroll Luna RN CCRN-K

## 2021-01-10 ENCOUNTER — Ambulatory Visit
Admission: RE | Admit: 2021-01-10 | Discharge: 2021-01-10 | Disposition: A | Discharge status: Home / Self Care | Payer: No Typology Code available for payment source | Source: Ambulatory Visit | Attending: Internal Medicine | Admitting: Internal Medicine

## 2021-01-10 DIAGNOSIS — C7931 Secondary malignant neoplasm of brain: Secondary | ICD-10-CM

## 2021-01-10 MED ORDER — GADOBENATE DIMEGLUMINE 529 MG/ML IV SOLN
15.0000 mL | Freq: Once | INTRAVENOUS | Status: AC | PRN
  Administered 2021-01-10: 15 mL via INTRAVENOUS

## 2021-01-10 NOTE — Progress Notes (Signed)
[facility]  [address] [place],  [place]  [address] [phone]  Clinic Day:  01/17/2021  Referring physician: Greig Right, MD  This document serves as a record of services personally performed by Dequincy Macarthur Critchley, MD. It was created on their behalf by [facility], a trained medical scribe. The creation of this record is based on the scribe's personal observations and the provider's statements to them.  HISTORY OF PRESENT ILLNESS:  The patient is a 55 y.o. female with metastatic colon cancer, which includes spread of disease to her abdominal cavity, lungs, brain and right adrenal gland. She comes in today to go over her CT scans to ascertain her new disease baseline after receiving 12 cycles of FOLFIRI/Avastin.  The patient claims to have toleraed her 12th cycle of treatment fairly well.  Her diarrhea remains prominent despite receiving atropine to offset the effects of irinotecan, whose dose has been decreased.  She also complains of belching.  What concerns her about this is that her belches are very foul-smelling, which was one of the initial symptoms which led to her initial colon cancer workup.  Despite these issues, she continues to function fairly well on a daily basis.    With respect to her colon cancer history, she is status post a right hemicolectomy in early October 2018, followed by 12 cycles of adjuvant chemotherapy, which were completed in April 2019.  In December 2021, she underwent a left cerebellar metastatectomy, whose pathology was consistent with metastatic colon cancer.  Tumor testing did come back MMR normal.  CT scans recently showed evidence of her cancer being in multiple locations, which has her on palliative chemotherapy at this time.    PHYSICAL EXAM:  Blood pressure 131/77, pulse (!) 103, temperature (!) 97.4 F (36.3 C), resp. rate 16, height 5' 8"  (1.727 m), weight 194 lb 1.6 oz (88 kg), last menstrual period 02/22/2019,  SpO2 96 %. Wt Readings from Last 3 Encounters:  01/17/21 194 lb 1.6 oz (88 kg)  01/16/21 196 lb 8 oz (89.1 kg)  01/09/21 198 lb (89.8 kg)   Body mass index is 29.51 kg/m. Performance status (ECOG): 1 Physical Exam Constitutional:      Appearance: Normal appearance. She is not ill-appearing.  HENT:     Mouth/Throat:     Mouth: Mucous membranes are moist.     Pharynx: Oropharynx is clear. No oropharyngeal exudate or posterior oropharyngeal erythema.  Cardiovascular:     Rate and Rhythm: Regular rhythm. Tachycardia present.     Heart sounds: No murmur heard.   No friction rub. No gallop.  Pulmonary:     Effort: Pulmonary effort is normal. No respiratory distress.     Breath sounds: Normal breath sounds. No wheezing, rhonchi or rales.  Abdominal:     General: Bowel sounds are normal. There is no distension.     Palpations: Abdomen is soft. There is no mass.     Tenderness: There is no abdominal tenderness.  Musculoskeletal:        General: No swelling.     Right lower leg: No edema.     Left lower leg: No edema.  Lymphadenopathy:     Cervical: No cervical adenopathy.     Upper Body:     Right upper body: No supraclavicular or axillary adenopathy.     Left upper body: No supraclavicular or axillary adenopathy.     Lower Body: No right inguinal adenopathy. No left inguinal adenopathy.  Skin:  General: Skin is warm.     Coloration: Skin is not jaundiced.     Findings: No lesion or rash.  Neurological:     General: No focal deficit present.     Mental Status: She is alert and oriented to person, place, and time. Mental status is at baseline.     Cranial Nerves: Cranial nerves are intact.  Psychiatric:        Mood and Affect: Mood normal.        Behavior: Behavior normal.        Thought Content: Thought content normal.   SCANS:  CT scans of her chest/abdomen/pelvis revealed the following: FINDINGS: CT CHEST FINDINGS  Cardiovascular: Right IJ Port-A-Cath terminates in  the right atrium. Heart size normal. No pericardial effusion.  Mediastinum/Nodes: No pathologically enlarged mediastinal, hilar or axillary lymph nodes. Esophagus is grossly unremarkable.  Lungs/Pleura: Spiculated posterior segment right upper lobe nodule is stable in size, measuring 1.4 x 2.0 cm (4/36) when remeasured on the prior exam in a similar fashion. Spiculated nodule in the lateral right lower lobe is also stable, 8 x 11 mm (4/68). No new pulmonary nodules. Minimal scarring in the lingula. No pleural fluid. Airway is unremarkable.  Musculoskeletal: Degenerative changes in the spine. No worrisome lytic or sclerotic lesions.  CT ABDOMEN PELVIS FINDINGS  Hepatobiliary: Liver is decreased in attenuation diffusely with areas of peripheral sparing. Added focal fat in segment 4. Liver and gallbladder are otherwise unremarkable. No biliary ductal dilatation.  Pancreas: Negative.  Spleen: Negative.  Adrenals/Urinary Tract: Adrenal glands are unremarkable. Subcentimeter low-attenuation lesions in the kidneys are too small to characterize. Kidneys are otherwise unremarkable. Ureters are decompressed. Bladder is grossly unremarkable.  Stomach/Bowel: Stomach and small bowel are unremarkable. Right hemicolectomy. Colon is otherwise unremarkable.  Vascular/Lymphatic: Atherosclerotic calcification of the aorta. No pathologically enlarged lymph nodes.  Reproductive: Hysterectomy.  No adnexal mass.  Other: Small bowel mesenteric nodules in the right abdomen are stable, measuring 1.4 x 1.4 cm and 0.7 x 1.2 cm, respectively (2/72 and 80). Small supraumbilical incisional hernia contains fat. Laxity of the ventral abdominal wall at the umbilicus and below. No free fluid.  Musculoskeletal: Degenerative changes in the spine. No worrisome lytic or sclerotic lesions.  IMPRESSION: 1. Stable metastatic disease in the chest and small bowel mesentery. 2. Hepatic steatosis. 3.  Aortic  atherosclerosis (ICD10-I70.0).  LABS:    Ref. Range 01/14/2021 00:00  Sodium Latest Ref Range: 137 - 147  139  Potassium Latest Ref Range: 3.4 - 5.3  3.7  Chloride Latest Ref Range: 99 - 108  101  CO2 Latest Ref Range: 13 - 22  23 (A)  Glucose Unknown 124  BUN Latest Ref Range: 4 - 21  17  Creatinine Latest Ref Range: 0.5 - 1.1  1.3 (A)  Calcium Latest Ref Range: 8.7 - 10.7  9.2  Alkaline Phosphatase Latest Ref Range: 25 - 125  154 (A)  Albumin Latest Ref Range: 3.5 - 5.0  4.4  AST Latest Ref Range: 13 - 35  33  ALT Latest Ref Range: 7 - 35  29  Bilirubin, Total Unknown 0.4    Ref. Range 01/14/2021 00:00  WBC Unknown 1.6  RBC Latest Ref Range: 3.87 - 5.11  3.5 (A)  Hemoglobin Latest Ref Range: 12.0 - 16.0  11.7 (A)  HCT Latest Ref Range: 36 - 46  34 (A)  Platelets Latest Ref Range: 150 - 399  126 (A)    ASSESSMENT & PLAN:  Assessment/Plan:  A 55 y.o. female with metastatic colon cancer.  In clinic today, I went over her CT scans with her, for which she could see that her disease has not progressed.  Clinically, the patient appears to be doing okay.  Of note, her white count is very low today, likely related to her chemotherapy.  It will be rechecked early next week.  If better, she will proceed with her 13th cycle of FOLFIRI/Avastin next week. I also anticipate giving her Zarxio to hasten her white count recovery before heading into her 14th cycle of treatment.  We will also give her IV fluids for her concern of dehydration related to her diarrhea.  Although she understands her disease is metastatic, she is concerned about her belching and diarrhea being due to an obstructive process along her GI tract.  I will refer her to GI so an EGD and colonoscopy can be done.  Otherwise, I will see her back in 2 weeks before she heads into her 14th cycle of  FOLFIRI/Avastin.  The patient understands all the plans discussed today and is in agreement with them.     I, Rita Ohara, am acting as  scribe for Marice Potter, MD    I have reviewed this report as typed by the medical scribe, and it is complete and accurate.  Dequincy Macarthur Critchley, MD

## 2021-01-13 ENCOUNTER — Inpatient Hospital Stay: Payer: PRIVATE HEALTH INSURANCE

## 2021-01-14 LAB — BASIC METABOLIC PANEL
BUN: 17 (ref 4–21)
CO2: 23 — AB (ref 13–22)
Chloride: 101 (ref 99–108)
Creatinine: 1.3 — AB (ref 0.5–1.1)
Glucose: 124
Potassium: 3.7 (ref 3.4–5.3)
Sodium: 139 (ref 137–147)

## 2021-01-14 LAB — CBC AND DIFFERENTIAL
HCT: 34 — AB (ref 36–46)
Hemoglobin: 11.7 — AB (ref 12.0–16.0)
Neutrophils Absolute: 0.48
Platelets: 126 — AB (ref 150–399)
WBC: 1.6

## 2021-01-14 LAB — HEPATIC FUNCTION PANEL
ALT: 29 (ref 7–35)
AST: 33 (ref 13–35)
Alkaline Phosphatase: 154 — AB (ref 25–125)
Bilirubin, Total: 0.4

## 2021-01-14 LAB — CBC: RBC: 3.5 — AB (ref 3.87–5.11)

## 2021-01-14 LAB — COMPREHENSIVE METABOLIC PANEL
Albumin: 4.4 (ref 3.5–5.0)
Calcium: 9.2 (ref 8.7–10.7)

## 2021-01-16 ENCOUNTER — Encounter: Payer: Self-pay | Admitting: Internal Medicine

## 2021-01-16 ENCOUNTER — Other Ambulatory Visit: Payer: Self-pay

## 2021-01-16 ENCOUNTER — Inpatient Hospital Stay (HOSPITAL_BASED_OUTPATIENT_CLINIC_OR_DEPARTMENT_OTHER): Payer: PRIVATE HEALTH INSURANCE | Admitting: Internal Medicine

## 2021-01-16 VITALS — BP 111/73 | HR 102 | Temp 98.0°F | Resp 17 | Ht 68.0 in | Wt 196.5 lb

## 2021-01-16 DIAGNOSIS — C7931 Secondary malignant neoplasm of brain: Secondary | ICD-10-CM | POA: Diagnosis not present

## 2021-01-16 DIAGNOSIS — Z5112 Encounter for antineoplastic immunotherapy: Secondary | ICD-10-CM | POA: Diagnosis not present

## 2021-01-16 NOTE — Progress Notes (Signed)
[facility] at [place],  [address] [phone]   Interval Evaluation  Date of Service: 01/16/21 Patient Name: Shannon [name] Patient MRN: [id number] Patient DOB: [birth year] Provider: Ventura Sellers, MD  Identifying Statement:  Shannon [name] is a 55 y.o. female with Brain metastases [facility]) [C79.31]   Primary Cancer: Colon Adenocarcinoma, Stage IV  Oncologic History: 05/29/20: Sub-occipital craniotomy, resection by Dr. Saintclair Halsted.  Path c/w colon adenocarcinoma 07/08/20: Completes fractionated SRS to resection cavity w/ Dr. Isidore Moos 10/22/20: Salvage SRS R cerebellum  Interval History:  Shannon [name] presents today for follow up after recent [place] and MRI brain.  Tolerated salvage radiation without issue.  She describes improvement in fatigue, systemic symptoms since dose-reducing the chemo and cutting down on hours at work.  Balance is still "not great" but still walking on her own, maintains full independence.  No changes to medications since prior visit.   H+P (07/02/20) Patient presented to medical attention at the end of December, 2021 with several weeks of progressive balance impairment.  She worsened toward the end to the point of needing full assist with walking, or being mostly bed-bound.  At the same time she developed bouts of vertigo, described as "room spinning around me", for which medical interventions were not effective.  CNS imaging demonstrated large enhancing mass in left cerebellar hemisphere with swelling and compression c/w likely brain metastasis; she underwent craniotomy and resection on 05/29/20 with Dr. Saintclair Halsted.  Following surgery, her symptoms improved considerably.  At present, she is functionally intact aside from issues with short term memory, fatigue, and mood.  Visited with radiation oncology today for post-operative simulation.   Medications: Current Outpatient Medications on File  Prior to Visit  Medication Sig Dispense Refill   acetaminophen (TYLENOL) 325 MG tablet Take 2 tablets (650 mg total) by mouth every 6 (six) hours as needed for mild pain.     atorvastatin (LIPITOR) 10 MG tablet Take 1 tablet (10 mg total) by mouth at bedtime. 30 tablet 0   CONTOUR NEXT TEST test strip 3 (three) times daily.     Dulaglutide (TRULICITY) 1.5 0000000 SOPN Inject into the skin.     fluvoxaMINE (LUVOX) 100 MG tablet Take 3 tablets (300 mg total) by mouth at bedtime. 270 tablet 1   hyoscyamine (LEVSIN SL) 0.125 MG SL tablet Place 1 tablet (0.125 mg total) under the tongue every 6 (six) hours as needed. 45 tablet 1   insulin aspart (NOVOLOG FLEXPEN) 100 UNIT/ML FlexPen Inject 8 Units into the skin 3 (three) times daily with meals. 15 mL 11   insulin glargine (LANTUS) 100 UNIT/ML Solostar Pen Inject 26 Units into the skin daily. 15 mL 11   lamoTRIgine (LAMICTAL) 150 MG tablet Take 1 tablet (150 mg total) by mouth daily. 90 tablet 3   levothyroxine (SYNTHROID) 75 MCG tablet Take 1 tablet (75 mcg total) by mouth daily. 30 tablet 0   loratadine (CLARITIN) 10 MG tablet Take 10 mg by mouth daily.     metFORMIN (GLUCOPHAGE) 500 MG tablet Take 1 tablet (500 mg total) by mouth 2 (two) times daily with a meal. 60 tablet 0   modafinil (PROVIGIL) 200 MG tablet Take 1.5 tablets (300 mg total) by mouth daily. 45 tablet 1   NOVOFINE PEN NEEDLE 32G X 6 MM MISC SMARTSIG:Syringe(s) SUB-Q     ondansetron (ZOFRAN) 4 MG tablet Take 1 tablet (4 mg total) by mouth every 4 (  four) hours as needed for nausea. 90 tablet 3   pantoprazole (PROTONIX) 40 MG tablet TAKE 1 TABLET BY MOUTH EVERYDAY AT BEDTIME 30 tablet 0   prochlorperazine (COMPAZINE) 10 MG tablet TAKE 1 TABLET BY MOUTH EVERY 6 HOURS AS NEEDED FOR NAUSEA OR VOMITING. 90 tablet 3   QUEtiapine (SEROQUEL) 400 MG tablet Take 2 tablets (800 mg total) by mouth at bedtime. 30 tablet 0   senna-docusate (SENOKOT-S) 8.6-50 MG tablet Take 2 tablets by mouth at  bedtime.     zolpidem (AMBIEN) 5 MG tablet Take 1 tablet (5 mg total) by mouth at bedtime as needed for sleep. 30 tablet 0   No current facility-administered medications on file prior to visit.    Allergies:  Allergies  Allergen Reactions   Clindamycin/Lincomycin Rash   Penicillins Rash    Reaction: 10 years   Past Medical History:  Past Medical History:  Diagnosis Date   Anemia    Iron De   Anxiety    Blood clot in vein    Bowel obstruction ([place])    Colon cancer ([place]) 2018   treated with surgery and chemotherapy   Depression    Diabetes mellitus without complication ([place])    Type II   DVT (deep venous thrombosis) ([place]) 2019   behind right knee- while she wasa on chemo   GERD (gastroesophageal reflux disease)    History of blood transfusion    History of chemotherapy    History of kidney stones 2012   passed   Hypothyroidism    Obesity    Past Surgical History:  Past Surgical History:  Procedure Laterality Date   ABDOMINAL HYSTERECTOMY  05/2019   total laparoscopin with tubes and ovariers removed also.   APPENDECTOMY  05/2019   COLON SURGERY  03/2017   Colon resection   COLONOSCOPY  2019   SUBOCCIPITAL CRANIECTOMY CERVICAL LAMINECTOMY Left 05/29/2020   Procedure: Suboccipital Craniectomy for Resection of Left Cerebellar Mass;  Surgeon: Kary Kos, MD;  Location: [facility];  Service: Neurosurgery;  Laterality: Left;   Social History:  Social History   Socioeconomic History   Marital status: Widowed    Spouse name: Not on file   Number of children: Not on file   Years of education: Not on file   Highest education level: Not on file  Occupational History   Occupation: care coordinator   Tobacco Use   Smoking status: Never   Smokeless tobacco: Never  Vaping Use   Vaping Use: Never used  Substance and Sexual Activity   Alcohol use: Yes    Comment: rare   Drug use: Never   Sexual activity: Not Currently  Other Topics Concern   Not on file  Social History  Narrative   Not on file   Social Determinants of Health   Financial Resource Strain: Not on file  Food Insecurity: Not on file  Transportation Needs: Not on file  Physical Activity: Not on file  Stress: Not on file  Social Connections: Not on file  Intimate Partner Violence: Not on file   Family History:  Family History  Problem Relation Age of Onset   Irritable bowel syndrome Mother    Colon polyps Father    Breast cancer Maternal Grandmother    Diabetes Maternal Grandmother    Diabetes Maternal Grandfather    Breast cancer Paternal Grandmother    Colon polyps Maternal Uncle    Stomach cancer Neg Hx    Pancreatic cancer Neg Hx  Esophageal cancer Neg Hx    Colon cancer Neg Hx     Review of Systems: Constitutional: +Fatigue Eyes: Doesn't report blurriness of vision Ears, nose, mouth, throat, and face: Doesn't report sore throat Respiratory: Doesn't report cough, dyspnea or wheezes Cardiovascular: Doesn't report palpitation, chest discomfort  Gastrointestinal:  Doesn't report nausea, constipation, diarrhea GU: Doesn't report incontinence Skin: Doesn't report skin rashes Neurological: Per HPI Musculoskeletal: Doesn't report joint pain Behavioral/Psych: +mood issues, depression  Physical Exam: Vitals:   01/16/21 1027  BP: 111/73  Pulse: (!) 102  Resp: 17  Temp: 98 F (36.7 C)  SpO2: 97%   KPS: 90. General: Alert, cooperative, pleasant, in no acute distress Head: Normal EENT: No conjunctival injection or scleral icterus.  Lungs: Resp effort normal Cardiac: Regular rate Abdomen: Non-distended abdomen Skin: No rashes cyanosis or petechiae. Extremities: No clubbing or edema  Neurologic Exam: Mental Status: Awake, alert, attentive to examiner. Oriented to self and environment. Language is fluent with intact comprehension.  Cranial Nerves: Visual acuity is grossly normal. Visual fields are full. Extra-ocular movements intact. No ptosis. Face is  symmetric Motor: Tone and bulk are normal. Power is full in both arms and legs. Reflexes are symmetric, no pathologic reflexes present.  Sensory: Intact to light touch Gait: Normal.   Labs: I have reviewed the data as listed    Component Value Date/Time   NA 137 01/03/2021 0000   K 4.1 01/03/2021 0000   CL 103 01/03/2021 0000   CO2 20 01/03/2021 0000   GLUCOSE 234 (H) 06/07/2020 0525   BUN 12 01/03/2021 0000   CREATININE 1.3 (A) 01/03/2021 0000   CREATININE 0.97 06/07/2020 0525   CALCIUM 8.9 01/03/2021 0000   PROT 5.2 (L) 06/07/2020 0525   ALBUMIN 4.2 01/03/2021 0000   AST 37 (A) 01/03/2021 0000   ALT 34 01/03/2021 0000   ALKPHOS 152 (A) 01/03/2021 0000   BILITOT 0.3 06/07/2020 0525   GFRNONAA >60 06/07/2020 0525   Lab Results  Component Value Date   WBC 3.4 01/03/2021   NEUTROABS 2.01 01/03/2021   HGB 12.0 01/03/2021   HCT 36 01/03/2021   MCV 99 12/10/2020   PLT 155 01/03/2021   Imaging:  [place] Clinician Interpretation: I have personally reviewed the CNS images as listed.  My interpretation, in the context of the patient's clinical presentation, is stable disease  MR BRAIN W WO CONTRAST  Result Date: 01/10/2021 CLINICAL DATA:  Follow-up CNS metastatic disease. EXAM: MRI HEAD WITHOUT AND WITH CONTRAST TECHNIQUE: Multiplanar, multiecho pulse sequences of the brain and surrounding structures were obtained without and with intravenous contrast. CONTRAST:  34m MULTIHANCE GADOBENATE DIMEGLUMINE 529 MG/ML IV SOLN COMPARISON:  10/04/2020 FINDINGS: BRAIN New Lesions: None. Larger lesions: None. Stable or Smaller lesions: 1. Decreased and now somewhat crenulated 5 mm mass in the lateral right cerebellum measured on 12:38. 2. Previously resected left cerebellar metastasis with diminished wispy and nodular enhancement at the resection site which appears postoperative. 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 Few tiny remote white matter insults. No acute  hemorrhage, hydrocephalus, or masslike finding. 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 Vascular: Preserved flow voids and vessel enhancements Skull and upper cervical spine: Normal marrow signal Sinuses/Orbits: No significant finding. IMPRESSION: No new or progressive disease. Electronically Signed   By: JMonte FantasiaM.D.   On: 01/10/2021 21:28    Assessment/Plan Brain metastases ([facility] [C79.31]   LShereese Kaercheris clinically and radiographically stable today, now having completed salvage SRS  to R cerebellar metastasis.  No new or progressive deficits, resection cavity is also stable.  We appreciate the opportunity to participate in the care of Carmetta Bering.  We ask that Ayomide Stanback return to clinic in 3 months with brain MRI, or sooner as needed.  All questions were answered. The patient knows to call the clinic with any problems, questions or concerns. No barriers to learning were detected.  The total time spent in the encounter was 30 minutes and more than 50% was on counseling and review of test results   Ventura Sellers, MD Medical Director of Neuro-Oncology [facility] [place] 01/16/21 10:34 AM

## 2021-01-16 NOTE — Progress Notes (Signed)
Patient knocked at my door to ask about financial concerns.  Patient receives treatment at the [facility]. Gave her my card with information regarding [employer] grant and the number to billing for total balance-related questions. Advised there is an advocate on [place] as well.  Staff message sent to [facility] to follow up with patient.  Patient has my card for any additional financial questions or concerns.

## 2021-01-17 ENCOUNTER — Other Ambulatory Visit: Payer: No Typology Code available for payment source

## 2021-01-17 ENCOUNTER — Telehealth: Payer: Self-pay | Admitting: Oncology

## 2021-01-17 ENCOUNTER — Inpatient Hospital Stay: Payer: PRIVATE HEALTH INSURANCE

## 2021-01-17 ENCOUNTER — Inpatient Hospital Stay (INDEPENDENT_AMBULATORY_CARE_PROVIDER_SITE_OTHER): Payer: PRIVATE HEALTH INSURANCE | Admitting: Oncology

## 2021-01-17 ENCOUNTER — Ambulatory Visit: Payer: No Typology Code available for payment source | Admitting: Hematology and Oncology

## 2021-01-17 ENCOUNTER — Encounter: Payer: Self-pay | Admitting: Oncology

## 2021-01-17 VITALS — BP 131/77 | HR 103 | Temp 97.4°F | Resp 16 | Ht 68.0 in | Wt 194.1 lb

## 2021-01-17 DIAGNOSIS — C7931 Secondary malignant neoplasm of brain: Secondary | ICD-10-CM

## 2021-01-17 DIAGNOSIS — C182 Malignant neoplasm of ascending colon: Secondary | ICD-10-CM | POA: Diagnosis not present

## 2021-01-17 DIAGNOSIS — C787 Secondary malignant neoplasm of liver and intrahepatic bile duct: Secondary | ICD-10-CM

## 2021-01-17 DIAGNOSIS — C189 Malignant neoplasm of colon, unspecified: Secondary | ICD-10-CM | POA: Diagnosis not present

## 2021-01-17 DIAGNOSIS — C7800 Secondary malignant neoplasm of unspecified lung: Secondary | ICD-10-CM

## 2021-01-17 NOTE — Telephone Encounter (Signed)
Per 8/19 LOS, patient scheduled for Sept Appt's.  Patient entered Appt in her Calendar

## 2021-01-20 ENCOUNTER — Encounter: Payer: Self-pay | Admitting: Oncology

## 2021-01-20 ENCOUNTER — Other Ambulatory Visit: Payer: Self-pay | Admitting: Oncology

## 2021-01-21 ENCOUNTER — Encounter: Payer: Self-pay | Admitting: Oncology

## 2021-01-21 ENCOUNTER — Inpatient Hospital Stay: Payer: PRIVATE HEALTH INSURANCE

## 2021-01-21 ENCOUNTER — Encounter: Payer: Self-pay | Admitting: Hematology and Oncology

## 2021-01-21 ENCOUNTER — Other Ambulatory Visit: Payer: Self-pay

## 2021-01-21 VITALS — BP 101/72 | HR 117 | Temp 98.1°F | Resp 18 | Ht 68.0 in | Wt 200.0 lb

## 2021-01-21 DIAGNOSIS — Z5112 Encounter for antineoplastic immunotherapy: Secondary | ICD-10-CM | POA: Diagnosis not present

## 2021-01-21 DIAGNOSIS — C182 Malignant neoplasm of ascending colon: Secondary | ICD-10-CM

## 2021-01-21 LAB — BASIC METABOLIC PANEL
BUN: 18 (ref 4–21)
CO2: 25 — AB (ref 13–22)
Chloride: 103 (ref 99–108)
Creatinine: 1.5 — AB (ref 0.5–1.1)
Glucose: 144
Potassium: 3.9 (ref 3.4–5.3)
Sodium: 139 (ref 137–147)

## 2021-01-21 LAB — CBC AND DIFFERENTIAL
HCT: 36 (ref 36–46)
Hemoglobin: 12 (ref 12.0–16.0)
Neutrophils Absolute: 1.22
Platelets: 130 — AB (ref 150–399)
WBC: 2.9

## 2021-01-21 LAB — COMPREHENSIVE METABOLIC PANEL
Albumin: 4.2 (ref 3.5–5.0)
Calcium: 9.4 (ref 8.7–10.7)

## 2021-01-21 LAB — HEPATIC FUNCTION PANEL
ALT: 34 (ref 7–35)
AST: 42 — AB (ref 13–35)
Alkaline Phosphatase: 142 — AB (ref 25–125)
Bilirubin, Total: 0.3

## 2021-01-21 LAB — CBC: RBC: 3.57 — AB (ref 3.87–5.11)

## 2021-01-21 MED ORDER — SODIUM CHLORIDE 0.9 % IV SOLN: Freq: Once | INTRAVENOUS | Status: AC

## 2021-01-21 MED ORDER — FLUOROURACIL CHEMO INJECTION 2.5 GM/50ML
400.0000 mg/m2 | Freq: Once | INTRAVENOUS | Status: AC
  Administered 2021-01-21: 850 mg via INTRAVENOUS
  Filled 2021-01-21: qty 17

## 2021-01-21 MED ORDER — PALONOSETRON HCL INJECTION 0.25 MG/5ML
0.2500 mg | Freq: Once | INTRAVENOUS | Status: AC
  Administered 2021-01-21: 0.25 mg via INTRAVENOUS
  Filled 2021-01-21: qty 5

## 2021-01-21 MED ORDER — LEUCOVORIN CALCIUM INJECTION 350 MG
400.0000 mg/m2 | Freq: Once | INTRAVENOUS | Status: AC
  Administered 2021-01-21: 836 mg via INTRAVENOUS
  Filled 2021-01-21: qty 25

## 2021-01-21 MED ORDER — SODIUM CHLORIDE 0.9 % IV SOLN
5.0000 mg/kg | Freq: Once | INTRAVENOUS | Status: AC
  Administered 2021-01-21: 450 mg via INTRAVENOUS
  Filled 2021-01-21: qty 16

## 2021-01-21 MED ORDER — LORAZEPAM 1 MG PO TABS
ORAL_TABLET | ORAL | Status: AC
  Filled 2021-01-21: qty 1

## 2021-01-21 MED ORDER — IRINOTECAN HCL CHEMO INJECTION 100 MG/5ML
135.0000 mg/m2 | Freq: Once | INTRAVENOUS | Status: AC
  Administered 2021-01-21: 280 mg via INTRAVENOUS
  Filled 2021-01-21: qty 14

## 2021-01-21 MED ORDER — SODIUM CHLORIDE 0.9 % IV SOLN
10.0000 mg | Freq: Once | INTRAVENOUS | Status: AC
  Administered 2021-01-21: 10 mg via INTRAVENOUS
  Filled 2021-01-21: qty 10

## 2021-01-21 MED ORDER — SODIUM CHLORIDE 0.9 % IV SOLN
150.0000 mg | Freq: Once | INTRAVENOUS | Status: AC
  Administered 2021-01-21: 150 mg via INTRAVENOUS
  Filled 2021-01-21: qty 150

## 2021-01-21 MED ORDER — ATROPINE SULFATE 1 MG/ML IJ SOLN
0.5000 mg | Freq: Once | INTRAMUSCULAR | Status: AC | PRN
  Administered 2021-01-21: 0.5 mg via INTRAVENOUS
  Filled 2021-01-21: qty 1

## 2021-01-21 MED ORDER — LORAZEPAM 1 MG PO TABS
1.0000 mg | ORAL_TABLET | Freq: Once | ORAL | Status: AC
  Administered 2021-01-21: 1 mg via ORAL

## 2021-01-21 MED ORDER — SODIUM CHLORIDE 0.9 % IV SOLN
2400.0000 mg/m2 | INTRAVENOUS | Status: DC
  Administered 2021-01-21: 5000 mg via INTRAVENOUS
  Filled 2021-01-21: qty 100

## 2021-01-21 NOTE — Patient Instructions (Signed)
Bevacizumab injection What is this medication? BEVACIZUMAB (be va SIZ yoo mab) is a monoclonal antibody. It is used to treatmany types of cancer. This medicine may be used for other purposes; ask your health care provider orpharmacist if you have questions. COMMON BRAND NAME(S): Avastin, MVASI, Zirabev What should I tell my care team before I take this medication? They need to know if you have any of these conditions: diabetes heart disease high blood pressure history of coughing up blood prior anthracycline chemotherapy (e.g., doxorubicin, daunorubicin, epirubicin) recent or ongoing radiation therapy recent or planning to have surgery stroke an unusual or allergic reaction to bevacizumab, hamster proteins, mouse proteins, other medicines, foods, dyes, or preservatives pregnant or trying to get pregnant breast-feeding How should I use this medication? This medicine is for infusion into a vein. It is given by a health careprofessional in a hospital or clinic setting. Talk to your pediatrician regarding the use of this medicine in children.Special care may be needed. Overdosage: If you think you have taken too much of this medicine contact apoison control center or emergency room at once. NOTE: This medicine is only for you. Do not share this medicine with others. What if I miss a dose? It is important not to miss your dose. Call your doctor or health careprofessional if you are unable to keep an appointment. What may interact with this medication? Interactions are not expected. This list may not describe all possible interactions. Give your health care provider a list of all the medicines, herbs, non-prescription drugs, or dietary supplements you use. Also tell them if you smoke, drink alcohol, or use illegaldrugs. Some items may interact with your medicine. What should I watch for while using this medication? Your condition will be monitored carefully while you are receiving this  medicine. You will need important blood work and urine testing done while youare taking this medicine. This medicine may increase your risk to bruise or bleed. Call your doctor orhealth care professional if you notice any unusual bleeding. Before having surgery, talk to your health care provider to make sure it is ok. This drug can increase the risk of poor healing of your surgical site or wound. You will need to stop this drug for 28 days before surgery. After surgery, wait at least 28 days before restarting this drug. Make sure the surgical site or wound is healed enough before restarting this drug. Talk to your health careprovider if questions. Do not become pregnant while taking this medicine or for 6 months after stopping it. Women should inform their doctor if they wish to become pregnant or think they might be pregnant. There is a potential for serious side effects to an unborn child. Talk to your health care professional or pharmacist for more information. Do not breast-feed an infant while taking this medicine andfor 6 months after the last dose. This medicine has caused ovarian failure in some women. This medicine may interfere with the ability to have a child. You should talk to your doctor orhealth care professional if you are concerned about your fertility. What side effects may I notice from receiving this medication? Side effects that you should report to your doctor or health care professionalas soon as possible: allergic reactions like skin rash, itching or hives, swelling of the face, lips, or tongue chest pain or chest tightness chills coughing up blood high fever seizures severe constipation signs and symptoms of bleeding such as bloody or black, tarry stools; red or dark-brown urine; spitting up blood   or brown material that looks like coffee grounds; red spots on the skin; unusual bruising or bleeding from the eye, gums, or nose signs and symptoms of a blood clot such as breathing  problems; chest pain; severe, sudden headache; pain, swelling, warmth in the leg signs and symptoms of a stroke like changes in vision; confusion; trouble speaking or understanding; severe headaches; sudden numbness or weakness of the face, arm or leg; trouble walking; dizziness; loss of balance or coordination stomach pain sweating swelling of legs or ankles vomiting weight gain Side effects that usually do not require medical attention (report to yourdoctor or health care professional if they continue or are bothersome): back pain changes in taste decreased appetite dry skin nausea tiredness This list may not describe all possible side effects. Call your doctor for medical advice about side effects. You may report side effects to FDA at1-800-FDA-1088. Where should I keep my medication? This drug is given in a hospital or clinic and will not be stored at home. NOTE: This sheet is a summary. It may not cover all possible information. If you have questions about this medicine, talk to your doctor, pharmacist, orhealth care provider.  2022 Else[employer] Standard (2019-03-15 10:50:46) Leucovorin injection What is this medication? LEUCOVORIN (loo koe VOR in) is used to prevent or treat the harmful effects of some medicines. This medicine is used to treat anemia caused by a low amount of folic acid in the body. It is also used with 5-fluorouracil (5-FU) to treatcolon cancer. This medicine may be used for other purposes; ask your health care provider orpharmacist if you have questions. What should I tell my care team before I take this medication? They need to know if you have any of these conditions: anemia from low levels of vitamin B-12 in the blood an unusual or allergic reaction to leucovorin, folic acid, other medicines, foods, dyes, or preservatives pregnant or trying to get pregnant breast-feeding How should I use this medication? This medicine is for injection into a muscle or into  a vein. It is given by ahealth care professional in a hospital or clinic setting. Talk to your pediatrician regarding the use of this medicine in children.Special care may be needed. Overdosage: If you think you have taken too much of this medicine contact apoison control center or emergency room at once. NOTE: This medicine is only for you. Do not share this medicine with others. What if I miss a dose? This does not apply. What may interact with this medication? capecitabine fluorouracil phenobarbital phenytoin primidone trimethoprim-sulfamethoxazole This list may not describe all possible interactions. Give your health care provider a list of all the medicines, herbs, non-prescription drugs, or dietary supplements you use. Also tell them if you smoke, drink alcohol, or use illegaldrugs. Some items may interact with your medicine. What should I watch for while using this medication? Your condition will be monitored carefully while you are receiving thismedicine. This medicine may increase the side effects of 5-fluorouracil, 5-FU. Tell your doctor or health care professional if you have diarrhea or mouth sores that donot get better or that get worse. What side effects may I notice from receiving this medication? Side effects that you should report to your doctor or health care professionalas soon as possible: allergic reactions like skin rash, itching or hives, swelling of the face, lips, or tongue breathing problems fever, infection mouth sores unusual bleeding or bruising unusually weak or tired Side effects that usually do not require medical attention (report to yourdoctor  or health care professional if they continue or are bothersome): constipation or diarrhea loss of appetite nausea, vomiting This list may not describe all possible side effects. Call your doctor for medical advice about side effects. You may report side effects to FDA at1-800-FDA-1088. Where should I keep my  medication? This drug is given in a hospital or clinic and will not be stored at home. NOTE: This sheet is a summary. It may not cover all possible information. If you have questions about this medicine, talk to your doctor, pharmacist, orhealth care provider.  2022 Elsevier/Gold Standard (2007-11-22 16:50:29) Fluorouracil, 5-FU injection What is this medication? FLUOROURACIL, 5-FU (flure oh YOOR a sil) is a chemotherapy drug. It slows the growth of cancer cells. This medicine is used to treat many types of cancer like breast cancer, colon or rectal cancer, pancreatic cancer, and stomachcancer. This medicine may be used for other purposes; ask your health care provider orpharmacist if you have questions. COMMON BRAND NAME(S): Adrucil What should I tell my care team before I take this medication? They need to know if you have any of these conditions: blood disorders dihydropyrimidine dehydrogenase (DPD) deficiency infection (especially a virus infection such as chickenpox, cold sores, or herpes) kidney disease liver disease malnourished, poor nutrition recent or ongoing radiation therapy an unusual or allergic reaction to fluorouracil, other chemotherapy, other medicines, foods, dyes, or preservatives pregnant or trying to get pregnant breast-feeding How should I use this medication? This drug is given as an infusion or injection into a vein. It is administeredin a hospital or clinic by a specially trained health care professional. Talk to your pediatrician regarding the use of this medicine in children.Special care may be needed. Overdosage: If you think you have taken too much of this medicine contact apoison control center or emergency room at once. NOTE: This medicine is only for you. Do not share this medicine with others. What if I miss a dose? It is important not to miss your dose. Call your doctor or health careprofessional if you are unable to keep an appointment. What may  interact with this medication? Do not take this medicine with any of the following medications: live virus vaccines This medicine may also interact with the following medications: medicines that treat or prevent blood clots like warfarin, enoxaparin, and dalteparin This list may not describe all possible interactions. Give your health care provider a list of all the medicines, herbs, non-prescription drugs, or dietary supplements you use. Also tell them if you smoke, drink alcohol, or use illegaldrugs. Some items may interact with your medicine. What should I watch for while using this medication? Visit your doctor for checks on your progress. This drug may make you feel generally unwell. This is not uncommon, as chemotherapy can affect healthy cells as well as cancer cells. Report any side effects. Continue your course oftreatment even though you feel ill unless your doctor tells you to stop. In some cases, you may be given additional medicines to help with side effects.Follow all directions for their use. Call your doctor or health care professional for advice if you get a fever, chills or sore throat, or other symptoms of a cold or flu. Do not treat yourself. This drug decreases your body's ability to fight infections. Try toavoid being around people who are sick. This medicine may increase your risk to bruise or bleed. Call your doctor orhealth care professional if you notice any unusual bleeding. Be careful brushing and flossing your teeth or using a  toothpick because you may get an infection or bleed more easily. If you have any dental work done,tell your dentist you are receiving this medicine. Avoid taking products that contain aspirin, acetaminophen, ibuprofen, naproxen, or ketoprofen unless instructed by your doctor. These medicines may hide afever. Do not become pregnant while taking this medicine. Women should inform their doctor if they wish to become pregnant or think they might be  pregnant. There is a potential for serious side effects to an unborn child. Talk to your health care professional or pharmacist for more information. Do not breast-feed aninfant while taking this medicine. Men should inform their doctor if they wish to father a child. This medicinemay lower sperm counts. Do not treat diarrhea with over the counter products. Contact your doctor ifyou have diarrhea that lasts more than 2 days or if it is severe and watery. This medicine can make you more sensitive to the sun. Keep out of the sun. If you cannot avoid being in the sun, wear protective clothing and use sunscreen.Do not use sun lamps or tanning beds/booths. What side effects may I notice from receiving this medication? Side effects that you should report to your doctor or health care professionalas soon as possible: allergic reactions like skin rash, itching or hives, swelling of the face, lips, or tongue low blood counts - this medicine may decrease the number of white blood cells, red blood cells and platelets. You may be at increased risk for infections and bleeding. signs of infection - fever or chills, cough, sore throat, pain or difficulty passing urine signs of decreased platelets or bleeding - bruising, pinpoint red spots on the skin, black, tarry stools, blood in the urine signs of decreased red blood cells - unusually weak or tired, fainting spells, lightheadedness breathing problems changes in vision chest pain mouth sores nausea and vomiting pain, swelling, redness at site where injected pain, tingling, numbness in the hands or feet redness, swelling, or sores on hands or feet stomach pain unusual bleeding Side effects that usually do not require medical attention (report to yourdoctor or health care professional if they continue or are bothersome): changes in finger or toe nails diarrhea dry or itchy skin hair loss headache loss of appetite sensitivity of eyes to the light stomach  upset unusually teary eyes This list may not describe all possible side effects. Call your doctor for medical advice about side effects. You may report side effects to FDA at1-800-FDA-1088. Where should I keep my medication? This drug is given in a hospital or clinic and will not be stored at home. NOTE: This sheet is a summary. It may not cover all possible information. If you have questions about this medicine, talk to your doctor, pharmacist, orhealth care provider.  2022 Elsevier/Gold Standard (2019-04-18 15:00:03) Irinotecan injection What is this medication? IRINOTECAN (ir in oh TEE kan ) is a chemotherapy drug. It is used to treatcolon and rectal cancer. This medicine may be used for other purposes; ask your health care provider orpharmacist if you have questions. COMMON BRAND NAME(S): Camptosar What should I tell my care team before I take this medication? They need to know if you have any of these conditions: dehydration diarrhea infection (especially a virus infection such as chickenpox, cold sores, or herpes) liver disease low blood counts, like low white cell, platelet, or red cell counts low levels of calcium, magnesium, or potassium in the blood recent or ongoing radiation therapy an unusual or allergic reaction to irinotecan, other medicines, foods, dyes,  or preservatives pregnant or trying to get pregnant breast-feeding How should I use this medication? This drug is given as an infusion into a vein. It is administered in a hospitalor clinic by a specially trained health care professional. Talk to your pediatrician regarding the use of this medicine in children.Special care may be needed. Overdosage: If you think you have taken too much of this medicine contact apoison control center or emergency room at once. NOTE: This medicine is only for you. Do not share this medicine with others. What if I miss a dose? It is important not to miss your dose. Call your doctor or  health careprofessional if you are unable to keep an appointment. What may interact with this medication? Do not take this medicine with any of the following medications: cobicistat itraconazole This medicine may interact with the following medications: antiviral medicines for HIV or AIDS certain antibiotics like rifampin or rifabutin certain medicines for fungal infections like ketoconazole, posaconazole, and voriconazole certain medicines for seizures like carbamazepine, phenobarbital, phenotoin clarithromycin gemfibrozil nefazodone [facility]. John's Wort This list may not describe all possible interactions. Give your health care provider a list of all the medicines, herbs, non-prescription drugs, or dietary supplements you use. Also tell them if you smoke, drink alcohol, or use illegaldrugs. Some items may interact with your medicine. What should I watch for while using this medication? Your condition will be monitored carefully while you are receiving this medicine. You will need important blood work done while you are taking thismedicine. This drug may make you feel generally unwell. This is not uncommon, as chemotherapy can affect healthy cells as well as cancer cells. Report any side effects. Continue your course of treatment even though you feel ill unless yourdoctor tells you to stop. In some cases, you may be given additional medicines to help with side effects.Follow all directions for their use. You may get drowsy or dizzy. Do not drive, use machinery, or do anything that needs mental alertness until you know how this medicine affects you. Do not stand or sit up quickly, especially if you are an older patient. This reducesthe risk of dizzy or fainting spells. Call your health care professional for advice if you get a fever, chills, or sore throat, or other symptoms of a cold or flu. Do not treat yourself. This medicine decreases your body's ability to fight infections. Try to avoid  beingaround people who are sick. Avoid taking products that contain aspirin, acetaminophen, ibuprofen, naproxen, or ketoprofen unless instructed by your doctor. These medicines may hide afever. This medicine may increase your risk to bruise or bleed. Call your doctor orhealth care professional if you notice any unusual bleeding. Be careful brushing and flossing your teeth or using a toothpick because you may get an infection or bleed more easily. If you have any dental work done,tell your dentist you are receiving this medicine. Do not become pregnant while taking this medicine or for 6 months after stopping it. Women should inform their health care professional if they wish to become pregnant or think they might be pregnant. Men should not father a child while taking this medicine and for 3 months after stopping it. There is potential for serious side effects to an unborn child. Talk to your health careprofessional for more information. Do not breast-feed an infant while taking this medicine or for 7 days afterstopping it. This medicine has caused ovarian failure in some women. This medicine may make it more difficult to get pregnant. Talk to  your health care professional if youare concerned about your fertility. This medicine has caused decreased sperm counts in some men. This may make it more difficult to father a child. Talk to your health care professional if [place] concerned about your fertility. What side effects may I notice from receiving this medication? Side effects that you should report to your doctor or health care professionalas soon as possible: allergic reactions like skin rash, itching or hives, swelling of the face, lips, or tongue chest pain diarrhea flushing, runny nose, sweating during infusion low blood counts - this medicine may decrease the number of white blood cells, red blood cells and platelets. You may be at increased risk for infections and bleeding. nausea,  vomiting pain, swelling, warmth in the leg signs of decreased platelets or bleeding - bruising, pinpoint red spots on the skin, black, tarry stools, blood in the urine signs of infection - fever or chills, cough, sore throat, pain or difficulty passing urine signs of decreased red blood cells - unusually weak or tired, fainting spells, lightheadedness Side effects that usually do not require medical attention (report to yourdoctor or health care professional if they continue or are bothersome): constipation hair loss headache loss of appetite mouth sores stomach pain This list may not describe all possible side effects. Call your doctor for medical advice about side effects. You may report side effects to FDA at1-800-FDA-1088. Where should I keep my medication? This drug is given in a hospital or clinic and will not be stored at home. NOTE: This sheet is a summary. It may not cover all possible information. If you have questions about this medicine, talk to your doctor, pharmacist, orhealth care provider.  2022 Elsevier/Gold Standard (2019-04-18 17:46:13)

## 2021-01-21 NOTE — Progress Notes (Signed)
Proceed with chemo today despite ANC=1220 and scr=1.5 per Dr. Bobby Rumpf.  Pt will be given additional fluids and set up for zarxio injections prior to next cycle.

## 2021-01-22 ENCOUNTER — Other Ambulatory Visit: Payer: Self-pay | Admitting: Radiation Therapy

## 2021-01-22 ENCOUNTER — Encounter: Payer: Self-pay | Admitting: Oncology

## 2021-01-22 NOTE — Progress Notes (Signed)
I spoke with patient about co-pay assistance and the [employer], she is going to call me in the next few days to set up a time to come in and go over what she might qualify for.

## 2021-01-22 NOTE — Progress Notes (Signed)
Shannon [name]  [address] [place],  [place]  [address] [phone]  Clinic Day:  01/30/2021  Referring physician: Greig Right, MD  This document serves as a record of services personally performed by Christyl Osentoski Macarthur Critchley, MD. It was created on their behalf by [facility] E, a trained medical scribe. The creation of this record is based on the scribe's personal observations and the provider's statements to them.  HISTORY OF PRESENT ILLNESS:  The patient is a 55 y.o. female with metastatic colon cancer, which includes spread of disease to her abdominal cavity, lungs, brain and right adrenal gland.   She comes in today to be evaluated before heading into her 13th cycle of FOLFIRI/Avastin.  The patient claims to have tolerated her 12th cycle of treatment fairly well.  Her diarrhea, although still present, is better since her irinotecan was decreased.  Imodium has also helped slow down her bowel frequency.  She denies having any new GI symptoms which concern her for disease progression.  She has also had a brain MRI recently which showed no evidence of new CNS disease.  With respect to her colon cancer history, she is status post a right hemicolectomy in early October 2018, followed by 12 cycles of adjuvant chemotherapy, which were completed in April 2019.  In December 2021, she underwent a left cerebellar metastatectomy, whose pathology was consistent with metastatic colon cancer.  Tumor testing did come back MMR normal.  CT scans recently showed evidence of her cancer being in multiple locations, which has her on palliative chemotherapy at this time.    PHYSICAL EXAM:  Blood pressure 126/82, pulse (!) 114, temperature 97.8 F (36.6 C), resp. rate 16, height 5' 8"  (1.727 m), weight 196 lb 3.2 oz (89 kg), last menstrual period 02/22/2019, SpO2 95 %. Wt Readings from Last 3 Encounters:  01/30/21 196 lb 3.2 oz (89 kg)  01/29/21 199 lb 4 oz (90.4 kg)  01/28/21 197  lb (89.4 kg)   Body mass index is 29.83 kg/m. Performance status (ECOG): 1 Physical Exam Constitutional:      Appearance: Normal appearance. She is not ill-appearing.  HENT:     Mouth/Throat:     Mouth: Mucous membranes are moist.     Pharynx: Oropharynx is clear. No oropharyngeal exudate or posterior oropharyngeal erythema.  Cardiovascular:     Rate and Rhythm: Regular rhythm. Tachycardia present.     Heart sounds: No murmur heard.   No friction rub. No gallop.  Pulmonary:     Effort: Pulmonary effort is normal. No respiratory distress.     Breath sounds: Normal breath sounds. No wheezing, rhonchi or rales.  Abdominal:     General: Bowel sounds are normal. There is no distension.     Palpations: Abdomen is soft. There is no mass.     Tenderness: There is no abdominal tenderness.  Musculoskeletal:        General: No swelling.     Right lower leg: No edema.     Left lower leg: No edema.  Lymphadenopathy:     Cervical: No cervical adenopathy.     Upper Body:     Right upper body: No supraclavicular or axillary adenopathy.     Left upper body: No supraclavicular or axillary adenopathy.     Lower Body: No right inguinal adenopathy. No left inguinal adenopathy.  Skin:    General: Skin is warm.     Coloration: Skin is not jaundiced.     Findings: No  lesion or rash.  Neurological:     General: No focal deficit present.     Mental Status: She is alert and oriented to person, place, and time. Mental status is at baseline.     Cranial Nerves: Cranial nerves are intact.  Psychiatric:        Mood and Affect: Mood normal.        Behavior: Behavior normal.        Thought Content: Thought content normal.   SCANS:  MRI head with and without contrast revealed the following: FINDINGS: BRAIN   New Lesions: None.   Larger lesions: None.   Stable or Smaller lesions:   1. Decreased and now somewhat crenulated 5 mm mass in the lateral right cerebellum measured on 12:38. 2.  Previously resected left cerebellar metastasis with diminished wispy and nodular enhancement at the resection site which appears postoperative.   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 Few tiny remote white matter insults. No acute hemorrhage, hydrocephalus, or masslike finding. 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   Vascular: Preserved flow voids and vessel enhancements   Skull and upper cervical spine: Normal marrow signal   Sinuses/Orbits: No significant finding.   IMPRESSION: No new or progressive disease.  LABS:    Ref. Range 01/30/2021 00:00  Sodium Latest Ref Range: 137 - 147  140  Potassium Latest Ref Range: 3.4 - 5.3  3.7  Chloride Latest Ref Range: 99 - 108  107  CO2 Latest Ref Range: 13 - 22  21  Glucose Unknown 244  BUN Latest Ref Range: 4 - 21  12  Creatinine Latest Ref Range: 0.5 - 1.1  1.2 (A)  Calcium Latest Ref Range: 8.7 - 10.7  8.8  Alkaline Phosphatase Latest Ref Range: 25 - 125  153 (A)  Albumin Latest Ref Range: 3.5 - 5.0  3.9  AST Latest Ref Range: 13 - 35  36 (A)  ALT Latest Ref Range: 7 - 35  37 (A)  Bilirubin, Total Unknown 0.3  WBC Unknown 6.3  RBC Latest Ref Range: 3.87 - 5.11  3.57 (A)  Hemoglobin Latest Ref Range: 12.0 - 16.0  11.7 (A)  HCT Latest Ref Range: 36 - 46  35 (A)  MCV Latest Ref Range: 81 - 99  115 (A)  Platelets Latest Ref Range: 150 - 399  115 (A)  NEUT# Unknown 5.36   ASSESSMENT & PLAN:  Assessment/Plan:  A 55 y.o. female with metastatic colon cancer.  She will proceed with her 13th cycle of FOLFIRI/Avastin next week.  Clinically, she appears to be doing well.  I will see her back in 2 weeks before she heads into her 14th cycle of  FOLFIRI/Avastin.  The patient understands all the plans discussed today and is in agreement with them.     I, Rita Ohara, am acting as scribe for Marice Potter, MD    I have reviewed this report as typed by the medical scribe, and it is complete and  accurate.  Shannon Demasi Macarthur Critchley, MD

## 2021-01-23 ENCOUNTER — Inpatient Hospital Stay: Payer: PRIVATE HEALTH INSURANCE

## 2021-01-23 ENCOUNTER — Other Ambulatory Visit: Payer: Self-pay

## 2021-01-23 VITALS — BP 136/81 | HR 102 | Temp 97.8°F | Resp 18 | Ht 68.0 in | Wt 197.0 lb

## 2021-01-23 DIAGNOSIS — C182 Malignant neoplasm of ascending colon: Secondary | ICD-10-CM

## 2021-01-23 DIAGNOSIS — Z5112 Encounter for antineoplastic immunotherapy: Secondary | ICD-10-CM | POA: Diagnosis not present

## 2021-01-23 MED ORDER — SODIUM CHLORIDE 0.9% FLUSH
10.0000 mL | INTRAVENOUS | Status: DC | PRN
  Administered 2021-01-23: 10 mL

## 2021-01-23 MED ORDER — HEPARIN SOD (PORK) LOCK FLUSH 100 UNIT/ML IV SOLN
500.0000 [IU] | Freq: Once | INTRAVENOUS | Status: AC | PRN
  Administered 2021-01-23: 500 [IU]

## 2021-01-23 NOTE — Patient Instructions (Signed)
Fluorouracil, 5-FU injection What is this medication? FLUOROURACIL, 5-FU (flure oh YOOR a sil) is a chemotherapy drug. It slows the growth of cancer cells. This medicine is used to treat many types of cancer like breast cancer, colon or rectal cancer, pancreatic cancer, and stomach cancer. This medicine may be used for other purposes; ask your health care provider or pharmacist if you have questions. COMMON BRAND NAME(S): Adrucil What should I tell my care team before I take this medication? They need to know if you have any of these conditions: blood disorders dihydropyrimidine dehydrogenase (DPD) deficiency infection (especially a virus infection such as chickenpox, cold sores, or herpes) kidney disease liver disease malnourished, poor nutrition recent or ongoing radiation therapy an unusual or allergic reaction to fluorouracil, other chemotherapy, other medicines, foods, dyes, or preservatives pregnant or trying to get pregnant breast-feeding How should I use this medication? This drug is given as an infusion or injection into a vein. It is administered in a hospital or clinic by a specially trained health care professional. Talk to your pediatrician regarding the use of this medicine in children. Special care may be needed. Overdosage: If you think you have taken too much of this medicine contact a poison control center or emergency room at once. NOTE: This medicine is only for you. Do not share this medicine with others. What if I miss a dose? It is important not to miss your dose. Call your doctor or health care professional if you are unable to keep an appointment. What may interact with this medication? Do not take this medicine with any of the following medications: live virus vaccines This medicine may also interact with the following medications: medicines that treat or prevent blood clots like warfarin, enoxaparin, and dalteparin This list may not describe all possible  interactions. Give your health care provider a list of all the medicines, herbs, non-prescription drugs, or dietary supplements you use. Also tell them if you smoke, drink alcohol, or use illegal drugs. Some items may interact with your medicine. What should I watch for while using this medication? Visit your doctor for checks on your progress. This drug may make you feel generally unwell. This is not uncommon, as chemotherapy can affect healthy cells as well as cancer cells. Report any side effects. Continue your course of treatment even though you feel ill unless your doctor tells you to stop. In some cases, you may be given additional medicines to help with side effects. Follow all directions for their use. Call your doctor or health care professional for advice if you get a fever, chills or sore throat, or other symptoms of a cold or flu. Do not treat yourself. This drug decreases your body's ability to fight infections. Try to avoid being around people who are sick. This medicine may increase your risk to bruise or bleed. Call your doctor or health care professional if you notice any unusual bleeding. Be careful brushing and flossing your teeth or using a toothpick because you may get an infection or bleed more easily. If you have any dental work done, tell your dentist you are receiving this medicine. Avoid taking products that contain aspirin, acetaminophen, ibuprofen, naproxen, or ketoprofen unless instructed by your doctor. These medicines may hide a fever. Do not become pregnant while taking this medicine. Women should inform their doctor if they wish to become pregnant or think they might be pregnant. There is a potential for serious side effects to an unborn child. Talk to your health care   professional or pharmacist for more information. Do not breast-feed an infant while taking this medicine. Men should inform their doctor if they wish to father a child. This medicine may lower sperm  counts. Do not treat diarrhea with over the counter products. Contact your doctor if you have diarrhea that lasts more than 2 days or if it is severe and watery. This medicine can make you more sensitive to the sun. Keep out of the sun. If you cannot avoid being in the sun, wear protective clothing and use sunscreen. Do not use sun lamps or tanning beds/booths. What side effects may I notice from receiving this medication? Side effects that you should report to your doctor or health care professional as soon as possible: allergic reactions like skin rash, itching or hives, swelling of the face, lips, or tongue low blood counts - this medicine may decrease the number of white blood cells, red blood cells and platelets. You may be at increased risk for infections and bleeding. signs of infection - fever or chills, cough, sore throat, pain or difficulty passing urine signs of decreased platelets or bleeding - bruising, pinpoint red spots on the skin, black, tarry stools, blood in the urine signs of decreased red blood cells - unusually weak or tired, fainting spells, lightheadedness breathing problems changes in vision chest pain mouth sores nausea and vomiting pain, swelling, redness at site where injected pain, tingling, numbness in the hands or feet redness, swelling, or sores on hands or feet stomach pain unusual bleeding Side effects that usually do not require medical attention (report to your doctor or health care professional if they continue or are bothersome): changes in finger or toe nails diarrhea dry or itchy skin hair loss headache loss of appetite sensitivity of eyes to the light stomach upset unusually teary eyes This list may not describe all possible side effects. Call your doctor for medical advice about side effects. You may report side effects to FDA at 1-800-FDA-1088. Where should I keep my medication? This drug is given in a hospital or clinic and will not be  stored at home. NOTE: This sheet is a summary. It may not cover all possible information. If you have questions about this medicine, talk to your doctor, pharmacist, or health care provider.  2022 Elsevier/Gold Standard (2019-04-18 15:00:03)  

## 2021-01-24 ENCOUNTER — Encounter: Payer: Self-pay | Admitting: Hematology and Oncology

## 2021-01-24 ENCOUNTER — Encounter: Payer: Self-pay | Admitting: Oncology

## 2021-01-24 DIAGNOSIS — C189 Malignant neoplasm of colon, unspecified: Secondary | ICD-10-CM | POA: Insufficient documentation

## 2021-01-24 DIAGNOSIS — C787 Secondary malignant neoplasm of liver and intrahepatic bile duct: Secondary | ICD-10-CM | POA: Insufficient documentation

## 2021-01-28 ENCOUNTER — Encounter: Payer: Self-pay | Admitting: Hematology and Oncology

## 2021-01-28 ENCOUNTER — Other Ambulatory Visit: Payer: Self-pay

## 2021-01-28 ENCOUNTER — Encounter: Payer: Self-pay | Admitting: Oncology

## 2021-01-28 ENCOUNTER — Inpatient Hospital Stay: Payer: PRIVATE HEALTH INSURANCE

## 2021-01-28 VITALS — BP 138/78 | HR 93 | Temp 98.7°F | Resp 18 | Ht 68.0 in | Wt 197.0 lb

## 2021-01-28 DIAGNOSIS — C182 Malignant neoplasm of ascending colon: Secondary | ICD-10-CM

## 2021-01-28 DIAGNOSIS — Z5112 Encounter for antineoplastic immunotherapy: Secondary | ICD-10-CM | POA: Diagnosis not present

## 2021-01-28 DIAGNOSIS — E860 Dehydration: Secondary | ICD-10-CM

## 2021-01-28 DIAGNOSIS — R197 Diarrhea, unspecified: Secondary | ICD-10-CM

## 2021-01-28 MED ORDER — SODIUM CHLORIDE 0.9 % IV SOLN: Freq: Once | INTRAVENOUS | Status: AC

## 2021-01-28 MED ORDER — FILGRASTIM-SNDZ 480 MCG/0.8ML IJ SOSY
480.0000 ug | PREFILLED_SYRINGE | Freq: Once | INTRAMUSCULAR | Status: AC
  Administered 2021-01-28: 480 ug via SUBCUTANEOUS
  Filled 2021-01-28: qty 0.8

## 2021-01-28 MED ORDER — HEPARIN SOD (PORK) LOCK FLUSH 100 UNIT/ML IV SOLN
500.0000 [IU] | Freq: Once | INTRAVENOUS | Status: AC | PRN
  Administered 2021-01-28: 500 [IU]

## 2021-01-28 NOTE — Patient Instructions (Signed)
Filgrastim, G-CSF injection What is this medication? FILGRASTIM, G-CSF (fil GRA stim) is a granulocyte colony-stimulating factor that stimulates the growth of neutrophils, a type of white blood cell (WBC) important in the body's fight against infection. It is used to reduce the incidence of fever and infection in patients with certain types of cancer who are receiving chemotherapy that affects the bone marrow, to stimulate blood cell production for removal of WBCs from the body prior to a bone marrow transplantation, to reduce the incidence of fever and infection in patients who have severe chronic neutropenia, and to improve survival outcomes followinghigh-dose radiation exposure that is toxic to the bone marrow. This medicine may be used for other purposes; ask your health care provider orpharmacist if you have questions. COMMON BRAND NAME(S): Neupogen, Nivestym, Releuko, Zarxio What should I tell my care team before I take this medication? They need to know if you have any of these conditions: kidney disease latex allergy ongoing radiation therapy sickle cell disease an unusual or allergic reaction to filgrastim, pegfilgrastim, other medicines, foods, dyes, or preservatives pregnant or trying to get pregnant breast-feeding How should I use this medication? This medicine is for injection under the skin or infusion into a vein. As an infusion into a vein, it is usually given by a health care professional in a hospital or clinic setting. If you get this medicine at home, you will be taught how to prepare and give this medicine. Refer to the Instructions for Use that come with your medication packaging. Use exactly as directed. Take your medicine at regular intervals. Do not take your medicine more often thandirected. It is important that you put your used needles and syringes in a special sharps container. Do not put them in a trash can. If you do not have a sharpscontainer, call your pharmacist or  healthcare provider to get one. Talk to your pediatrician regarding the use of this medicine in children. While this drug may be prescribed for children as young as 7 months for selectedconditions, precautions do apply. Overdosage: If you think you have taken too much of this medicine contact apoison control center or emergency room at once. NOTE: This medicine is only for you. Do not share this medicine with others. What if I miss a dose? It is important not to miss your dose. Call your doctor or health careprofessional if you miss a dose. What may interact with this medication? This medicine may interact with the following medications: medicines that may cause a release of neutrophils, such as lithium This list may not describe all possible interactions. Give your health care provider a list of all the medicines, herbs, non-prescription drugs, or dietary supplements you use. Also tell them if you smoke, drink alcohol, or use illegaldrugs. Some items may interact with your medicine. What should I watch for while using this medication? Your condition will be monitored carefully while you are receiving thismedicine. You may need blood work done while you are taking this medicine. Talk to your health care provider about your risk of cancer. You may be more atrisk for certain types of cancer if you take this medicine. What side effects may I notice from receiving this medication? Side effects that you should report to your doctor or health care professionalas soon as possible: allergic reactions like skin rash, itching or hives, swelling of the face, lips, or tongue back pain dizziness or feeling faint fever pain, redness, or irritation at site where injected pinpoint red spots on the skin  shortness of breath or breathing problems signs and symptoms of kidney injury like trouble passing urine, change in the amount of urine, or red or dark-brown urine stomach or side pain, or pain at the  shoulder swelling tiredness unusual bleeding or bruising Side effects that usually do not require medical attention (report to yourdoctor or health care professional if they continue or are bothersome): bone pain cough diarrhea hair loss headache muscle pain This list may not describe all possible side effects. Call your doctor for medical advice about side effects. You may report side effects to FDA at1-800-FDA-1088. Where should I keep my medication? Keep out of the reach of children. Store in a refrigerator between 2 and 8 degrees C (36 and 46 degrees F). Do not freeze. Keep in carton to protect from light. Throw away this medicine if vials or syringes are left out of the refrigerator for more than 24 hours. Throw awayany unused medicine after the expiration date. NOTE: This sheet is a summary. It may not cover all possible information. If you have questions about this medicine, talk to your doctor, pharmacist, orhealth care provider.  2022 Elsevier/Gold Standard (2019-06-08 18:47:55) Dehydration, Adult Dehydration is condition in which there is not enough water or other fluids in the body. This happens when a person loses more fluids than he or she takes in. Important body parts cannot work right without the right amount of fluids. Anyloss of fluids from the body can cause dehydration. Dehydration can be mild, worse, or very bad. It should be treated right away tokeep it from getting very bad. What are the causes? This condition may be caused by: Conditions that cause loss of water or other fluids, such as: Watery poop (diarrhea). Vomiting. Sweating a lot. Peeing (urinating) a lot. Not drinking enough fluids, especially when you: Are ill. Are doing things that take a lot of energy to do. Other illnesses and conditions, such as fever or infection. Certain medicines, such as medicines that take extra fluid out of the body (diuretics). Lack of safe drinking water. Not being able to  get enough water and food. What increases the risk? The following factors may make you more likely to develop this condition: Having a long-term (chronic) illness that has not been treated the right way, such as: Diabetes. Heart disease. Kidney disease. Being 18 years of age or older. Having a disability. Living in a place that is high above the ground or sea (high in altitude). The thinner, dried air causes more fluid loss. Doing exercises that put stress on your body for a long time. What are the signs or symptoms? Symptoms of dehydration depend on how bad it is. Mild or worse dehydration Thirst. Dry lips or dry mouth. Feeling dizzy or light-headed, especially when you stand up from sitting. Muscle cramps. Your body making: Dark pee (urine). Pee may be the color of tea. Less pee than normal. Less tears than normal. Headache. Very bad dehydration Changes in skin. Skin may: Be cold to the touch (clammy). Be blotchy or pale. Not go back to normal right after you lightly pinch it and let it go. Little or no tears, pee, or sweat. Changes in vital signs, such as: Fast breathing. Low blood pressure. Weak pulse. Pulse that is more than 100 beats a minute when you are sitting still. Other changes, such as: Feeling very thirsty. Eyes that look hollow (sunken). Cold hands and feet. Being mixed up (confused). Being very tired (lethargic) or having trouble waking from sleep.  Short-term weight loss. Loss of consciousness. How is this treated? Treatment for this condition depends on how bad it is. Treatment should start right away. Do not wait until your condition gets very bad. Very bad dehydration is an emergency. You will need to go to a hospital. Mild or worse dehydration can be treated at home. You may be asked to: Drink more fluids. Drink an oral rehydration solution (ORS). This drink helps get the right amounts of fluids and salts and minerals in the blood  (electrolytes). Very bad dehydration can be treated: With fluids through an IV tube. By getting normal levels of salts and minerals in your blood. This is often done by giving salts and minerals through a tube. The tube is passed through your nose and into your stomach. By treating the root cause. Follow these instructions at home: Oral rehydration solution If told by your doctor, drink an ORS: Make an ORS. Use instructions on the package. Start by drinking small amounts, about  cup (120 mL) every 5-10 minutes. Slowly drink more until you have had the amount that your doctor said to have. Eating and drinking        Drink enough clear fluid to keep your pee pale yellow. If you were told to drink an ORS, finish the ORS first. Then, start slowly drinking other clear fluids. Drink fluids such as: Water. Do not drink only water. Doing that can make the salt (sodium) level in your body get too low. Water from ice chips you suck on. Fruit juice that you have added water to (diluted). Low-calorie sports drinks. Eat foods that have the right amounts of salts and minerals, such as: Bananas. Oranges. Potatoes. Tomatoes. Spinach. Do not drink alcohol. Avoid: Drinks that have a lot of sugar. These include: High-calorie sports drinks. Fruit juice that you did not add water to. Soda. Caffeine. Foods that are greasy or have a lot of fat or sugar. General instructions Take over-the-counter and prescription medicines only as told by your doctor. Do not take salt tablets. Doing that can make the salt level in your body get too high. Return to your normal activities as told by your doctor. Ask your doctor what activities are safe for you. Keep all follow-up visits as told by your doctor. This is important. Contact a doctor if: You have pain in your belly (abdomen) and the pain: Gets worse. Stays in one place. You have a rash. You have a stiff neck. You get angry or annoyed (irritable)  more easily than normal. You are more tired or have a harder time waking than normal. You feel: Weak or dizzy. Very thirsty. Get help right away if you have: Any symptoms of very bad dehydration. Symptoms of vomiting, such as: You cannot eat or drink without vomiting. Your vomiting gets worse or does not go away. Your vomit has blood or green stuff in it. Symptoms that get worse with treatment. A fever. A very bad headache. Problems with peeing or pooping (having a bowel movement), such as: Watery poop that gets worse or does not go away. Blood in your poop (stool). This may cause poop to look black and tarry. Not peeing in 6-8 hours. Peeing only a small amount of very dark pee in 6-8 hours. Trouble breathing. These symptoms may be an emergency. Do not wait to see if the symptoms will go away. Get medical help right away. Call your local emergency services (911 in the [place] Do not drive yourself to the hospital.  Summary Dehydration is a condition in which there is not enough water or other fluids in the body. This happens when a person loses more fluids than he or she takes in. Treatment for this condition depends on how bad it is. Treatment should be started right away. Do not wait until your condition gets very bad. Drink enough clear fluid to keep your pee pale yellow. If you were told to drink an oral rehydration solution (ORS), finish the ORS first. Then, start slowly drinking other clear fluids. Take over-the-counter and prescription medicines only as told by your doctor. Get help right away if you have any symptoms of very bad dehydration. This information is not intended to replace advice given to you by your health care provider. Make sure you discuss any questions you have with your healthcare provider. Document Revised: 12/29/2018 Document Reviewed: 12/29/2018 Elsevier Patient Education  [employer].

## 2021-01-28 NOTE — Progress Notes (Signed)
1350: PT STABLE AT TIME OF DISCHARGE  

## 2021-01-28 NOTE — Addendum Note (Signed)
Addended by: Juanetta Beets on: 01/28/2021 01:43 PM   Modules accepted: Orders

## 2021-01-29 ENCOUNTER — Inpatient Hospital Stay: Payer: PRIVATE HEALTH INSURANCE

## 2021-01-29 VITALS — BP 137/82 | HR 98 | Temp 97.3°F | Resp 18 | Ht 68.0 in | Wt 199.2 lb

## 2021-01-29 DIAGNOSIS — C182 Malignant neoplasm of ascending colon: Secondary | ICD-10-CM

## 2021-01-29 DIAGNOSIS — Z5112 Encounter for antineoplastic immunotherapy: Secondary | ICD-10-CM | POA: Diagnosis not present

## 2021-01-29 MED ORDER — FILGRASTIM-SNDZ 480 MCG/0.8ML IJ SOSY
480.0000 ug | PREFILLED_SYRINGE | Freq: Once | INTRAMUSCULAR | Status: AC
  Administered 2021-01-29: 480 ug via SUBCUTANEOUS
  Filled 2021-01-29: qty 0.8

## 2021-01-29 NOTE — Patient Instructions (Signed)
Filgrastim, G-CSF injection What is this medication? FILGRASTIM, G-CSF (fil GRA stim) is a granulocyte colony-stimulating factor that stimulates the growth of neutrophils, a type of white blood cell (WBC) important in the body's fight against infection. It is used to reduce the incidence of fever and infection in patients with certain types of cancer who are receiving chemotherapy that affects the bone marrow, to stimulate blood cell production for removal of WBCs from the body prior to a bone marrow transplantation, to reduce the incidence of fever and infection in patients who have severe chronic neutropenia, and to improve survival outcomes following high-dose radiation exposure that is toxic to the bone marrow. This medicine may be used for other purposes; ask your health care provider or pharmacist if you have questions. COMMON BRAND NAME(S): Neupogen, Nivestym, Releuko, Zarxio What should I tell my care team before I take this medication? They need to know if you have any of these conditions: kidney disease latex allergy ongoing radiation therapy sickle cell disease an unusual or allergic reaction to filgrastim, pegfilgrastim, other medicines, foods, dyes, or preservatives pregnant or trying to get pregnant breast-feeding How should I use this medication? This medicine is for injection under the skin or infusion into a vein. As an infusion into a vein, it is usually given by a health care professional in a hospital or clinic setting. If you get this medicine at home, you will be taught how to prepare and give this medicine. Refer to the Instructions for Use that come with your medication packaging. Use exactly as directed. Take your medicine at regular intervals. Do not take your medicine more often than directed. It is important that you put your used needles and syringes in a special sharps container. Do not put them in a trash can. If you do not have a sharps container, call your pharmacist  or healthcare provider to get one. Talk to your pediatrician regarding the use of this medicine in children. While this drug may be prescribed for children as young as 7 months for selected conditions, precautions do apply. Overdosage: If you think you have taken too much of this medicine contact a poison control center or emergency room at once. NOTE: This medicine is only for you. Do not share this medicine with others. What if I miss a dose? It is important not to miss your dose. Call your doctor or health care professional if you miss a dose. What may interact with this medication? This medicine may interact with the following medications: medicines that may cause a release of neutrophils, such as lithium This list may not describe all possible interactions. Give your health care provider a list of all the medicines, herbs, non-prescription drugs, or dietary supplements you use. Also tell them if you smoke, drink alcohol, or use illegal drugs. Some items may interact with your medicine. What should I watch for while using this medication? Your condition will be monitored carefully while you are receiving this medicine. You may need blood work done while you are taking this medicine. Talk to your health care provider about your risk of cancer. You may be more at risk for certain types of cancer if you take this medicine. What side effects may I notice from receiving this medication? Side effects that you should report to your doctor or health care professional as soon as possible: allergic reactions like skin rash, itching or hives, swelling of the face, lips, or tongue back pain dizziness or feeling faint fever pain, redness, or   irritation at site where injected pinpoint red spots on the skin shortness of breath or breathing problems signs and symptoms of kidney injury like trouble passing urine, change in the amount of urine, or red or dark-brown urine stomach or side pain, or pain at  the shoulder swelling tiredness unusual bleeding or bruising Side effects that usually do not require medical attention (report to your doctor or health care professional if they continue or are bothersome): bone pain cough diarrhea hair loss headache muscle pain This list may not describe all possible side effects. Call your doctor for medical advice about side effects. You may report side effects to FDA at 1-800-FDA-1088. Where should I keep my medication? Keep out of the reach of children. Store in a refrigerator between 2 and 8 degrees C (36 and 46 degrees F). Do not freeze. Keep in carton to protect from light. Throw away this medicine if vials or syringes are left out of the refrigerator for more than 24 hours. Throw away any unused medicine after the expiration date. NOTE: This sheet is a summary. It may not cover all possible information. If you have questions about this medicine, talk to your doctor, pharmacist, or health care provider.  2022 Elsevier/Gold Standard (2019-06-08 18:47:55)  

## 2021-01-30 ENCOUNTER — Inpatient Hospital Stay (INDEPENDENT_AMBULATORY_CARE_PROVIDER_SITE_OTHER): Payer: PRIVATE HEALTH INSURANCE | Admitting: Oncology

## 2021-01-30 ENCOUNTER — Inpatient Hospital Stay: Payer: PRIVATE HEALTH INSURANCE

## 2021-01-30 ENCOUNTER — Encounter: Payer: Self-pay | Admitting: Oncology

## 2021-01-30 ENCOUNTER — Ambulatory Visit: Payer: PRIVATE HEALTH INSURANCE | Admitting: Psychology

## 2021-01-30 ENCOUNTER — Other Ambulatory Visit: Payer: Self-pay | Admitting: Hematology and Oncology

## 2021-01-30 VITALS — BP 126/82 | HR 114 | Temp 97.8°F | Resp 16 | Ht 68.0 in | Wt 196.2 lb

## 2021-01-30 DIAGNOSIS — G9389 Other specified disorders of brain: Secondary | ICD-10-CM | POA: Diagnosis present

## 2021-01-30 DIAGNOSIS — C182 Malignant neoplasm of ascending colon: Secondary | ICD-10-CM | POA: Diagnosis not present

## 2021-01-30 DIAGNOSIS — Z23 Encounter for immunization: Secondary | ICD-10-CM | POA: Insufficient documentation

## 2021-01-30 DIAGNOSIS — Z5189 Encounter for other specified aftercare: Secondary | ICD-10-CM | POA: Insufficient documentation

## 2021-01-30 DIAGNOSIS — F331 Major depressive disorder, recurrent, moderate: Secondary | ICD-10-CM | POA: Diagnosis present

## 2021-01-30 DIAGNOSIS — Z5112 Encounter for antineoplastic immunotherapy: Secondary | ICD-10-CM | POA: Insufficient documentation

## 2021-01-30 DIAGNOSIS — G441 Vascular headache, not elsewhere classified: Secondary | ICD-10-CM | POA: Diagnosis present

## 2021-01-30 DIAGNOSIS — C7931 Secondary malignant neoplasm of brain: Secondary | ICD-10-CM | POA: Insufficient documentation

## 2021-01-30 DIAGNOSIS — Z5111 Encounter for antineoplastic chemotherapy: Secondary | ICD-10-CM | POA: Insufficient documentation

## 2021-01-30 DIAGNOSIS — F5101 Primary insomnia: Secondary | ICD-10-CM | POA: Diagnosis present

## 2021-01-30 DIAGNOSIS — Z79899 Other long term (current) drug therapy: Secondary | ICD-10-CM | POA: Insufficient documentation

## 2021-01-30 DIAGNOSIS — C189 Malignant neoplasm of colon, unspecified: Secondary | ICD-10-CM | POA: Insufficient documentation

## 2021-01-30 LAB — HEPATIC FUNCTION PANEL
ALT: 37 — AB (ref 7–35)
AST: 36 — AB (ref 13–35)
Alkaline Phosphatase: 153 — AB (ref 25–125)
Bilirubin, Total: 0.3

## 2021-01-30 LAB — CBC AND DIFFERENTIAL
HCT: 35 — AB (ref 36–46)
Hemoglobin: 11.7 — AB (ref 12.0–16.0)
Neutrophils Absolute: 5.36
Platelets: 115 — AB (ref 150–399)
WBC: 6.3

## 2021-01-30 LAB — TOTAL PROTEIN, URINE DIPSTICK: Protein, ur: 30 mg/dL — AB

## 2021-01-30 LAB — COMPREHENSIVE METABOLIC PANEL
Albumin: 3.9 (ref 3.5–5.0)
Calcium: 8.8 (ref 8.7–10.7)

## 2021-01-30 LAB — BASIC METABOLIC PANEL
BUN: 12 (ref 4–21)
CO2: 21 (ref 13–22)
Chloride: 107 (ref 99–108)
Creatinine: 1.2 — AB (ref 0.5–1.1)
Glucose: 244
Potassium: 3.7 (ref 3.4–5.3)
Sodium: 140 (ref 137–147)

## 2021-01-30 LAB — CBC
MCV: 115 — AB (ref 81–99)
RBC: 3.57 — AB (ref 3.87–5.11)

## 2021-02-04 ENCOUNTER — Inpatient Hospital Stay: Payer: PRIVATE HEALTH INSURANCE

## 2021-02-04 ENCOUNTER — Encounter: Payer: Self-pay | Admitting: Oncology

## 2021-02-04 ENCOUNTER — Other Ambulatory Visit: Payer: Self-pay

## 2021-02-04 ENCOUNTER — Other Ambulatory Visit: Payer: Self-pay | Admitting: Hematology and Oncology

## 2021-02-04 VITALS — BP 126/81 | HR 125 | Temp 97.8°F | Resp 16 | Ht 68.0 in | Wt 196.0 lb

## 2021-02-04 DIAGNOSIS — C7931 Secondary malignant neoplasm of brain: Secondary | ICD-10-CM | POA: Diagnosis not present

## 2021-02-04 DIAGNOSIS — T451X5A Adverse effect of antineoplastic and immunosuppressive drugs, initial encounter: Secondary | ICD-10-CM

## 2021-02-04 DIAGNOSIS — C182 Malignant neoplasm of ascending colon: Secondary | ICD-10-CM

## 2021-02-04 MED ORDER — SODIUM CHLORIDE 0.9 % IV SOLN
150.0000 mg | Freq: Once | INTRAVENOUS | Status: AC
  Administered 2021-02-04: 150 mg via INTRAVENOUS
  Filled 2021-02-04: qty 5

## 2021-02-04 MED ORDER — PALONOSETRON HCL INJECTION 0.25 MG/5ML
0.2500 mg | Freq: Once | INTRAVENOUS | Status: AC
Start: 2021-02-04 — End: 2021-02-04
  Administered 2021-02-04: 0.25 mg via INTRAVENOUS
  Filled 2021-02-04: qty 5

## 2021-02-04 MED ORDER — ATROPINE SULFATE 1 MG/ML IJ SOLN
0.5000 mg | Freq: Once | INTRAMUSCULAR | Status: AC | PRN
  Administered 2021-02-04: 0.5 mg via INTRAVENOUS
  Filled 2021-02-04: qty 1

## 2021-02-04 MED ORDER — SODIUM CHLORIDE 0.9 % IV SOLN
10.0000 mg | Freq: Once | INTRAVENOUS | Status: AC
  Administered 2021-02-04: 10 mg via INTRAVENOUS
  Filled 2021-02-04: qty 10

## 2021-02-04 MED ORDER — FLUOROURACIL CHEMO INJECTION 2.5 GM/50ML
400.0000 mg/m2 | Freq: Once | INTRAVENOUS | Status: AC
  Administered 2021-02-04: 850 mg via INTRAVENOUS
  Filled 2021-02-04: qty 17

## 2021-02-04 MED ORDER — SODIUM CHLORIDE 0.9% FLUSH
10.0000 mL | INTRAVENOUS | Status: DC | PRN
Start: 2021-02-04 — End: 2021-02-04

## 2021-02-04 MED ORDER — SODIUM CHLORIDE 0.9 % IV SOLN: Freq: Once | INTRAVENOUS | Status: AC

## 2021-02-04 MED ORDER — FLUOROURACIL CHEMO INJECTION 5 GM/100ML
2400.0000 mg/m2 | INTRAVENOUS | Status: DC
  Administered 2021-02-04: 5000 mg via INTRAVENOUS
  Filled 2021-02-04: qty 100

## 2021-02-04 MED ORDER — SODIUM CHLORIDE 0.9 % IV SOLN
5.0000 mg/kg | Freq: Once | INTRAVENOUS | Status: AC
  Administered 2021-02-04: 450 mg via INTRAVENOUS
  Filled 2021-02-04: qty 16

## 2021-02-04 MED ORDER — DEXTROSE 5 % IV SOLN
400.0000 mg/m2 | Freq: Once | INTRAVENOUS | Status: AC
  Administered 2021-02-04: 836 mg via INTRAVENOUS
  Filled 2021-02-04: qty 41.8

## 2021-02-04 MED ORDER — LORAZEPAM 1 MG PO TABS
1.0000 mg | ORAL_TABLET | Freq: Once | ORAL | Status: AC
  Administered 2021-02-04: 1 mg via ORAL
  Filled 2021-02-04: qty 1

## 2021-02-04 MED ORDER — IRINOTECAN HCL CHEMO INJECTION 100 MG/5ML
135.0000 mg/m2 | Freq: Once | INTRAVENOUS | Status: AC
  Administered 2021-02-04: 280 mg via INTRAVENOUS
  Filled 2021-02-04: qty 14

## 2021-02-04 NOTE — Progress Notes (Signed)
Discharged home, stable  

## 2021-02-04 NOTE — Patient Instructions (Addendum)
[facility]  Discharge Instructions: Thank you for choosing [facility] to provide your oncology and hematology care.  If you have a lab appointment with the [facility], please go directly to the [facility] and check in at the registration area.   Wear comfortable clothing and clothing appropriate for easy access to any Portacath or PICC line.   We strive to give you quality time with your provider. You may need to reschedule your appointment if you arrive late (15 or more minutes).  Arriving late affects you and other patients whose appointments are after yours.  Also, if you miss three or more appointments without notifying the office, you may be dismissed from the clinic at the provider's discretion.      For prescription refill requests, have your pharmacy contact our office and allow 72 hours for refills to be completed.    Today you received the following chemotherapy and/or immunotherapy agents:Adrucil, Zirabev, Irinotecan, and Leucovorin   To help prevent nausea and vomiting after your treatment, we encourage you to take your nausea medication as directed.  BELOW ARE SYMPTOMS THAT SHOULD BE REPORTED IMMEDIATELY: *FEVER GREATER THAN 100.4 F (38 C) OR HIGHER *CHILLS OR SWEATING *NAUSEA AND VOMITING THAT IS NOT CONTROLLED WITH YOUR NAUSEA MEDICATION *UNUSUAL SHORTNESS OF BREATH *UNUSUAL BRUISING OR BLEEDING *URINARY PROBLEMS (pain or burning when urinating, or frequent urination) *BOWEL PROBLEMS (unusual diarrhea, constipation, pain near the anus) TENDERNESS IN MOUTH AND THROAT WITH OR WITHOUT PRESENCE OF ULCERS (sore throat, sores in mouth, or a toothache) UNUSUAL RASH, SWELLING OR PAIN  UNUSUAL VAGINAL DISCHARGE OR ITCHING   Items with * indicate a potential emergency and should be followed up as soon as possible or go to the Emergency Department if any problems should occur.  Please show the CHEMOTHERAPY ALERT CARD or IMMUNOTHERAPY  ALERT CARD at check-in to the Emergency Department and triage nurse.  Should you have questions after your visit or need to cancel or reschedule your appointment, please contact [facility]  Dept: [phone]  and follow the prompts.  Office hours are 8:00 a.m. to 4:30 p.m. Monday - Friday. Please note that voicemails left after 4:00 p.m. may not be returned until the following business day.  We are closed weekends and major holidays. You have access to a nurse at all times for urgent questions. Please call the main number to the clinic Dept: [phone] and follow the prompts.  For any non-urgent questions, you may also contact your provider using MyChart. We now offer e-Visits for anyone 45 and older to request care online for non-urgent symptoms. For details visit [email or web address].   Also download the MyChart app! Go to the app store, search "MyChart", open the app, select [facility], and log in with your MyChart username and password.  Due to Covid, a mask is required upon entering the hospital/clinic. If you do not have a mask, one will be given to you upon arrival. For doctor visits, patients may have 1 support person aged 60 or older with them. For treatment visits, patients cannot have anyone with them due to current Covid guidelines and our immunocompromised population.   Fluorouracil, 5-FU injection What is this medication? FLUOROURACIL, 5-FU (flure oh YOOR a sil) is a chemotherapy drug. It slows the growth of cancer cells. This medicine is used to treat many types of cancer like breast cancer, colon or rectal cancer, pancreatic cancer, and stomach cancer. This medicine may be  used for other purposes; ask your health care provider or pharmacist if you have questions. COMMON BRAND NAME(S): Adrucil What should I tell my care team before I take this medication? They need to know if you have any of these conditions: blood disorders dihydropyrimidine  dehydrogenase (DPD) deficiency infection (especially a virus infection such as chickenpox, cold sores, or herpes) kidney disease liver disease malnourished, poor nutrition recent or ongoing radiation therapy an unusual or allergic reaction to fluorouracil, other chemotherapy, other medicines, foods, dyes, or preservatives pregnant or trying to get pregnant breast-feeding How should I use this medication? This drug is given as an infusion or injection into a vein. It is administered in a hospital or clinic by a specially trained health care professional. Talk to your pediatrician regarding the use of this medicine in children. Special care may be needed. Overdosage: If you think you have taken too much of this medicine contact a poison control center or emergency room at once. NOTE: This medicine is only for you. Do not share this medicine with others. What if I miss a dose? It is important not to miss your dose. Call your doctor or health care professional if you are unable to keep an appointment. What may interact with this medication? Do not take this medicine with any of the following medications: live virus vaccines This medicine may also interact with the following medications: medicines that treat or prevent blood clots like warfarin, enoxaparin, and dalteparin This list may not describe all possible interactions. Give your health care provider a list of all the medicines, herbs, non-prescription drugs, or dietary supplements you use. Also tell them if you smoke, drink alcohol, or use illegal drugs. Some items may interact with your medicine. What should I watch for while using this medication? Visit your doctor for checks on your progress. This drug may make you feel generally unwell. This is not uncommon, as chemotherapy can affect healthy cells as well as cancer cells. Report any side effects. Continue your course of treatment even though you feel ill unless your doctor tells you to  stop. In some cases, you may be given additional medicines to help with side effects. Follow all directions for their use. Call your doctor or health care professional for advice if you get a fever, chills or sore throat, or other symptoms of a cold or flu. Do not treat yourself. This drug decreases your body's ability to fight infections. Try to avoid being around people who are sick. This medicine may increase your risk to bruise or bleed. Call your doctor or health care professional if you notice any unusual bleeding. Be careful brushing and flossing your teeth or using a toothpick because you may get an infection or bleed more easily. If you have any dental work done, tell your dentist you are receiving this medicine. Avoid taking products that contain aspirin, acetaminophen, ibuprofen, naproxen, or ketoprofen unless instructed by your doctor. These medicines may hide a fever. Do not become pregnant while taking this medicine. Women should inform their doctor if they wish to become pregnant or think they might be pregnant. There is a potential for serious side effects to an unborn child. Talk to your health care professional or pharmacist for more information. Do not breast-feed an infant while taking this medicine. Men should inform their doctor if they wish to father a child. This medicine may lower sperm counts. Do not treat diarrhea with over the counter products. Contact your doctor if you have diarrhea that  lasts more than 2 days or if it is severe and watery. This medicine can make you more sensitive to the sun. Keep out of the sun. If you cannot avoid being in the sun, wear protective clothing and use sunscreen. Do not use sun lamps or tanning beds/booths. What side effects may I notice from receiving this medication? Side effects that you should report to your doctor or health care professional as soon as possible: allergic reactions like skin rash, itching or hives, swelling of the face,  lips, or tongue low blood counts - this medicine may decrease the number of white blood cells, red blood cells and platelets. You may be at increased risk for infections and bleeding. signs of infection - fever or chills, cough, sore throat, pain or difficulty passing urine signs of decreased platelets or bleeding - bruising, pinpoint red spots on the skin, black, tarry stools, blood in the urine signs of decreased red blood cells - unusually weak or tired, fainting spells, lightheadedness breathing problems changes in vision chest pain mouth sores nausea and vomiting pain, swelling, redness at site where injected pain, tingling, numbness in the hands or feet redness, swelling, or sores on hands or feet stomach pain unusual bleeding Side effects that usually do not require medical attention (report to your doctor or health care professional if they continue or are bothersome): changes in finger or toe nails diarrhea dry or itchy skin hair loss headache loss of appetite sensitivity of eyes to the light stomach upset unusually teary eyes This list may not describe all possible side effects. Call your doctor for medical advice about side effects. You may report side effects to FDA at 1-800-FDA-1088. Where should I keep my medication? This drug is given in a hospital or clinic and will not be stored at home. NOTE: This sheet is a summary. It may not cover all possible information. If you have questions about this medicine, talk to your doctor, pharmacist, or health care provider.  2022 Elsevier/Gold Standard (2019-04-18 15:00:03) Leucovorin injection What is this medication? LEUCOVORIN (loo koe VOR in) is used to prevent or treat the harmful effects of some medicines. This medicine is used to treat anemia caused by a low amount of folic acid in the body. It is also used with 5-fluorouracil (5-FU) to treat colon cancer. This medicine may be used for other purposes; ask your health care  provider or pharmacist if you have questions. What should I tell my care team before I take this medication? They need to know if you have any of these conditions: anemia from low levels of vitamin B-12 in the blood an unusual or allergic reaction to leucovorin, folic acid, other medicines, foods, dyes, or preservatives pregnant or trying to get pregnant breast-feeding How should I use this medication? This medicine is for injection into a muscle or into a vein. It is given by a health care professional in a hospital or clinic setting. Talk to your pediatrician regarding the use of this medicine in children. Special care may be needed. Overdosage: If you think you have taken too much of this medicine contact a poison control center or emergency room at once. NOTE: This medicine is only for you. Do not share this medicine with others. What if I miss a dose? This does not apply. What may interact with this medication? capecitabine fluorouracil phenobarbital phenytoin primidone trimethoprim-sulfamethoxazole This list may not describe all possible interactions. Give your health care provider a list of all the medicines, herbs, non-prescription  drugs, or dietary supplements you use. Also tell them if you smoke, drink alcohol, or use illegal drugs. Some items may interact with your medicine. What should I watch for while using this medication? Your condition will be monitored carefully while you are receiving this medicine. This medicine may increase the side effects of 5-fluorouracil, 5-FU. Tell your doctor or health care professional if you have diarrhea or mouth sores that do not get better or that get worse. What side effects may I notice from receiving this medication? Side effects that you should report to your doctor or health care professional as soon as possible: allergic reactions like skin rash, itching or hives, swelling of the face, lips, or tongue breathing problems fever,  infection mouth sores unusual bleeding or bruising unusually weak or tired Side effects that usually do not require medical attention (report to your doctor or health care professional if they continue or are bothersome): constipation or diarrhea loss of appetite nausea, vomiting This list may not describe all possible side effects. Call your doctor for medical advice about side effects. You may report side effects to FDA at 1-800-FDA-1088. Where should I keep my medication? This drug is given in a hospital or clinic and will not be stored at home. NOTE: This sheet is a summary. It may not cover all possible information. If you have questions about this medicine, talk to your doctor, pharmacist, or health care provider.  2022 Elsevier/Gold Standard (2007-11-22 16:50:29) Irinotecan injection What is this medication? IRINOTECAN (ir in oh TEE kan ) is a chemotherapy drug. It is used to treat colon and rectal cancer. This medicine may be used for other purposes; ask your health care provider or pharmacist if you have questions. COMMON BRAND NAME(S): Camptosar What should I tell my care team before I take this medication? They need to know if you have any of these conditions: dehydration diarrhea infection (especially a virus infection such as chickenpox, cold sores, or herpes) liver disease low blood counts, like low white cell, platelet, or red cell counts low levels of calcium, magnesium, or potassium in the blood recent or ongoing radiation therapy an unusual or allergic reaction to irinotecan, other medicines, foods, dyes, or preservatives pregnant or trying to get pregnant breast-feeding How should I use this medication? This drug is given as an infusion into a vein. It is administered in a hospital or clinic by a specially trained health care professional. Talk to your pediatrician regarding the use of this medicine in children. Special care may be needed. Overdosage: If you think  you have taken too much of this medicine contact a poison control center or emergency room at once. NOTE: This medicine is only for you. Do not share this medicine with others. What if I miss a dose? It is important not to miss your dose. Call your doctor or health care professional if you are unable to keep an appointment. What may interact with this medication? Do not take this medicine with any of the following medications: cobicistat itraconazole This medicine may interact with the following medications: antiviral medicines for HIV or AIDS certain antibiotics like rifampin or rifabutin certain medicines for fungal infections like ketoconazole, posaconazole, and voriconazole certain medicines for seizures like carbamazepine, phenobarbital, phenotoin clarithromycin gemfibrozil nefazodone St. John's Wort This list may not describe all possible interactions. Give your health care provider a list of all the medicines, herbs, non-prescription drugs, or dietary supplements you use. Also tell them if you smoke, drink alcohol, or use  illegal drugs. Some items may interact with your medicine. What should I watch for while using this medication? Your condition will be monitored carefully while you are receiving this medicine. You will need important blood work done while you are taking this medicine. This drug may make you feel generally unwell. This is not uncommon, as chemotherapy can affect healthy cells as well as cancer cells. Report any side effects. Continue your course of treatment even though you feel ill unless your doctor tells you to stop. In some cases, you may be given additional medicines to help with side effects. Follow all directions for their use. You may get drowsy or dizzy. Do not drive, use machinery, or do anything that needs mental alertness until you know how this medicine affects you. Do not stand or sit up quickly, especially if you are an older patient. This reduces the  risk of dizzy or fainting spells. Call your health care professional for advice if you get a fever, chills, or sore throat, or other symptoms of a cold or flu. Do not treat yourself. This medicine decreases your body's ability to fight infections. Try to avoid being around people who are sick. Avoid taking products that contain aspirin, acetaminophen, ibuprofen, naproxen, or ketoprofen unless instructed by your doctor. These medicines may hide a fever. This medicine may increase your risk to bruise or bleed. Call your doctor or health care professional if you notice any unusual bleeding. Be careful brushing and flossing your teeth or using a toothpick because you may get an infection or bleed more easily. If you have any dental work done, tell your dentist you are receiving this medicine. Do not become pregnant while taking this medicine or for 6 months after stopping it. Women should inform their health care professional if they wish to become pregnant or think they might be pregnant. Men should not father a child while taking this medicine and for 3 months after stopping it. There is potential for serious side effects to an unborn child. Talk to your health care professional for more information. Do not breast-feed an infant while taking this medicine or for 7 days after stopping it. This medicine has caused ovarian failure in some women. This medicine may make it more difficult to get pregnant. Talk to your health care professional if you are concerned about your fertility. This medicine has caused decreased sperm counts in some men. This may make it more difficult to father a child. Talk to your health care professional if you are concerned about your fertility. What side effects may I notice from receiving this medication? Side effects that you should report to your doctor or health care professional as soon as possible: allergic reactions like skin rash, itching or hives, swelling of the face, lips,  or tongue chest pain diarrhea flushing, runny nose, sweating during infusion low blood counts - this medicine may decrease the number of white blood cells, red blood cells and platelets. You may be at increased risk for infections and bleeding. nausea, vomiting pain, swelling, warmth in the leg signs of decreased platelets or bleeding - bruising, pinpoint red spots on the skin, black, tarry stools, blood in the urine signs of infection - fever or chills, cough, sore throat, pain or difficulty passing urine signs of decreased red blood cells - unusually weak or tired, fainting spells, lightheadedness Side effects that usually do not require medical attention (report to your doctor or health care professional if they continue or are bothersome): constipation hair  loss headache loss of appetite mouth sores stomach pain This list may not describe all possible side effects. Call your doctor for medical advice about side effects. You may report side effects to FDA at 1-800-FDA-1088. Where should I keep my medication? This drug is given in a hospital or clinic and will not be stored at home. NOTE: This sheet is a summary. It may not cover all possible information. If you have questions about this medicine, talk to your doctor, pharmacist, or health care provider.  2022 Elsevier/Gold Standard (2019-04-18 17:46:13) Bevacizumab injection What is this medication? BEVACIZUMAB (be va SIZ yoo mab) is a monoclonal antibody. It is used to treat many types of cancer. This medicine may be used for other purposes; ask your health care provider or pharmacist if you have questions. COMMON BRAND NAME(S): Avastin, MVASI, Noah Charon What should I tell my care team before I take this medication? They need to know if you have any of these conditions: diabetes heart disease high blood pressure history of coughing up blood prior anthracycline chemotherapy (e.g., doxorubicin, daunorubicin, epirubicin) recent or  ongoing radiation therapy recent or planning to have surgery stroke an unusual or allergic reaction to bevacizumab, hamster proteins, mouse proteins, other medicines, foods, dyes, or preservatives pregnant or trying to get pregnant breast-feeding How should I use this medication? This medicine is for infusion into a vein. It is given by a health care professional in a hospital or clinic setting. Talk to your pediatrician regarding the use of this medicine in children. Special care may be needed. Overdosage: If you think you have taken too much of this medicine contact a poison control center or emergency room at once. NOTE: This medicine is only for you. Do not share this medicine with others. What if I miss a dose? It is important not to miss your dose. Call your doctor or health care professional if you are unable to keep an appointment. What may interact with this medication? Interactions are not expected. This list may not describe all possible interactions. Give your health care provider a list of all the medicines, herbs, non-prescription drugs, or dietary supplements you use. Also tell them if you smoke, drink alcohol, or use illegal drugs. Some items may interact with your medicine. What should I watch for while using this medication? Your condition will be monitored carefully while you are receiving this medicine. You will need important blood work and urine testing done while you are taking this medicine. This medicine may increase your risk to bruise or bleed. Call your doctor or health care professional if you notice any unusual bleeding. Before having surgery, talk to your health care provider to make sure it is ok. This drug can increase the risk of poor healing of your surgical site or wound. You will need to stop this drug for 28 days before surgery. After surgery, wait at least 28 days before restarting this drug. Make sure the surgical site or wound is healed enough before  restarting this drug. Talk to your health care provider if questions. Do not become pregnant while taking this medicine or for 6 months after stopping it. Women should inform their doctor if they wish to become pregnant or think they might be pregnant. There is a potential for serious side effects to an unborn child. Talk to your health care professional or pharmacist for more information. Do not breast-feed an infant while taking this medicine and for 6 months after the last dose. This medicine has caused ovarian  failure in some women. This medicine may interfere with the ability to have a child. You should talk to your doctor or health care professional if you are concerned about your fertility. What side effects may I notice from receiving this medication? Side effects that you should report to your doctor or health care professional as soon as possible: allergic reactions like skin rash, itching or hives, swelling of the face, lips, or tongue chest pain or chest tightness chills coughing up blood high fever seizures severe constipation signs and symptoms of bleeding such as bloody or black, tarry stools; red or dark-brown urine; spitting up blood or brown material that looks like coffee grounds; red spots on the skin; unusual bruising or bleeding from the eye, gums, or nose signs and symptoms of a blood clot such as breathing problems; chest pain; severe, sudden headache; pain, swelling, warmth in the leg signs and symptoms of a stroke like changes in vision; confusion; trouble speaking or understanding; severe headaches; sudden numbness or weakness of the face, arm or leg; trouble walking; dizziness; loss of balance or coordination stomach pain sweating swelling of legs or ankles vomiting weight gain Side effects that usually do not require medical attention (report to your doctor or health care professional if they continue or are bothersome): back pain changes in taste decreased  appetite dry skin nausea tiredness This list may not describe all possible side effects. Call your doctor for medical advice about side effects. You may report side effects to FDA at 1-800-FDA-1088. Where should I keep my medication? This drug is given in a hospital or clinic and will not be stored at home. NOTE: This sheet is a summary. It may not cover all possible information. If you have questions about this medicine, talk to your doctor, pharmacist, or health care provider.  2022 Elsevier/Gold Standard (2019-03-15 10:50:46)

## 2021-02-06 ENCOUNTER — Inpatient Hospital Stay: Payer: PRIVATE HEALTH INSURANCE

## 2021-02-06 ENCOUNTER — Other Ambulatory Visit: Payer: Self-pay

## 2021-02-06 VITALS — BP 114/80 | HR 100 | Temp 97.9°F | Resp 16 | Ht 68.0 in

## 2021-02-06 DIAGNOSIS — C182 Malignant neoplasm of ascending colon: Secondary | ICD-10-CM

## 2021-02-06 DIAGNOSIS — C7931 Secondary malignant neoplasm of brain: Secondary | ICD-10-CM | POA: Diagnosis not present

## 2021-02-06 MED ORDER — SODIUM CHLORIDE 0.9% FLUSH
10.0000 mL | INTRAVENOUS | Status: DC | PRN
  Administered 2021-02-06: 10 mL

## 2021-02-06 MED ORDER — HEPARIN SOD (PORK) LOCK FLUSH 100 UNIT/ML IV SOLN
500.0000 [IU] | Freq: Once | INTRAVENOUS | Status: AC | PRN
  Administered 2021-02-06: 500 [IU]

## 2021-02-06 NOTE — Progress Notes (Signed)
Discharged home stable  

## 2021-02-07 NOTE — Progress Notes (Signed)
[facility]  [address] [place],  [place]  [address] [phone]  Clinic Day:  02/14/2021  Referring physician: Greig Right, MD  This document serves as a record of services personally performed by Dequincy Macarthur Critchley, MD. It was created on their behalf by [facility], a trained medical scribe. The creation of this record is based on the scribe's personal observations and the provider's statements to them.  HISTORY OF PRESENT ILLNESS:  The patient is a 55 y.o. female with metastatic colon cancer, which includes spread of disease to her abdominal cavity, lungs, brain and right adrenal gland. She comes in today prior to a 15th cycle of FOLFIRI/Avastin.  The patient claims to have toleraed her 14th cycle of treatment fairly well.  Her diarrhea remains prominent despite receiving atropine to offset the effects of irinotecan, whose dose has been decreased.  She has also had more nausea/vomiting lately, for which her Compazine helps control.  Despite these issues, she continues to function fairly well on a daily basis.   She is scheduled to undergo both a colonoscopy and EGD in the forthcoming weeks.     With respect to her colon cancer history, she is status post a right hemicolectomy in early October 2018, followed by 12 cycles of adjuvant chemotherapy, which were completed in April 2019.  In December 2021, she underwent a left cerebellar metastatectomy, whose pathology was consistent with metastatic colon cancer.  Tumor testing did come back MMR normal.  CT scans recently showed evidence of her cancer being in multiple locations, which has her on palliative chemotherapy at this time.    PHYSICAL EXAM:  Blood pressure (!) 149/93, pulse 98, temperature 98.6 F (37 C), resp. rate 16, height 5' 8"  (1.727 m), weight 193 lb 11.2 oz (87.9 kg), last menstrual period 02/22/2019, SpO2 98 %. Wt Readings from Last 3 Encounters:  02/14/21 193 lb 11.2 oz (87.9 kg)   02/04/21 196 lb (88.9 kg)  01/30/21 196 lb 3.2 oz (89 kg)   Body mass index is 29.45 kg/m. Performance status (ECOG): 1 Physical Exam Constitutional:      General: She is not in acute distress.    Appearance: Normal appearance. She is normal weight. She is not ill-appearing.  HENT:     Head: Normocephalic and atraumatic.  Eyes:     General: No scleral icterus.    Extraocular Movements: Extraocular movements intact.     Conjunctiva/sclera: Conjunctivae normal.     Pupils: Pupils are equal, round, and reactive to light.  Cardiovascular:     Rate and Rhythm: Normal rate and regular rhythm.     Pulses: Normal pulses.     Heart sounds: Normal heart sounds. No murmur heard.   No friction rub. No gallop.  Pulmonary:     Effort: Pulmonary effort is normal. No respiratory distress.     Breath sounds: Normal breath sounds.  Abdominal:     General: Bowel sounds are normal. There is no distension.     Palpations: Abdomen is soft. There is no hepatomegaly, splenomegaly or mass.     Tenderness: There is no abdominal tenderness.  Musculoskeletal:        General: Normal range of motion.     Cervical back: Normal range of motion and neck supple.     Right lower leg: No edema.     Left lower leg: No edema.  Lymphadenopathy:     Cervical: No cervical adenopathy.  Skin:    General: Skin  is warm and dry.  Neurological:     General: No focal deficit present.     Mental Status: She is alert and oriented to person, place, and time. Mental status is at baseline.  Psychiatric:        Mood and Affect: Mood normal.        Behavior: Behavior normal.        Thought Content: Thought content normal.        Judgment: Judgment normal.    LABS:    Ref. Range 02/14/2021 00:00  Sodium Latest Ref Range: 137 - 147  139  Potassium Latest Ref Range: 3.4 - 5.3  3.5  Chloride Latest Ref Range: 99 - 108  104  CO2 Latest Ref Range: 13 - 22  21  Glucose Unknown 264  BUN Latest Ref Range: 4 - 21  12   Creatinine Latest Ref Range: 0.5 - 1.1  1.2 (A)  Calcium Latest Ref Range: 8.7 - 10.7  8.9  Alkaline Phosphatase Latest Ref Range: 25 - 125  149 (A)  Albumin Latest Ref Range: 3.5 - 5.0  4.2  AST Latest Ref Range: 13 - 35  30  ALT Latest Ref Range: 7 - 35  31  Bilirubin, Total Unknown 0.4  WBC Unknown 3.0  RBC Latest Ref Range: 3.87 - 5.11  3.34 (A)  Hemoglobin Latest Ref Range: 12.0 - 16.0  11.2 (A)  HCT Latest Ref Range: 36 - 46  33 (A)  MCV Latest Ref Range: 81 - 99  98  Platelets Latest Ref Range: 150 - 399  127 (A)  NEUT# Unknown 1.44    ASSESSMENT & PLAN:  Assessment/Plan:  A 55 y.o. female with metastatic colon cancer. She will proceed with her 15th cycle of FOLFIRI/Avastin next week. I will give her white cell growth factor support to prevent delays with her next cycle of chemotherapy.  Clinically, the patient appears to be doing okay.  I will see her back in 2 weeks before she heads into her 16th cycle of  FOLFIRI/Avastin.  The patient understands all the plans discussed today and is in agreement with them.     I, Rita Ohara, am acting as scribe for Marice Potter, MD    I have reviewed this report as typed by the medical scribe, and it is complete and accurate.  Dequincy Macarthur Critchley, MD

## 2021-02-09 ENCOUNTER — Encounter: Payer: Self-pay | Admitting: Oncology

## 2021-02-09 ENCOUNTER — Encounter: Payer: Self-pay | Admitting: Hematology and Oncology

## 2021-02-14 ENCOUNTER — Inpatient Hospital Stay (INDEPENDENT_AMBULATORY_CARE_PROVIDER_SITE_OTHER): Payer: PRIVATE HEALTH INSURANCE | Admitting: Oncology

## 2021-02-14 ENCOUNTER — Other Ambulatory Visit: Payer: Self-pay | Admitting: Hematology and Oncology

## 2021-02-14 ENCOUNTER — Inpatient Hospital Stay: Payer: PRIVATE HEALTH INSURANCE

## 2021-02-14 ENCOUNTER — Ambulatory Visit: Payer: No Typology Code available for payment source

## 2021-02-14 ENCOUNTER — Telehealth: Payer: Self-pay | Admitting: Oncology

## 2021-02-14 VITALS — BP 149/93 | HR 98 | Temp 98.6°F | Resp 16 | Ht 68.0 in | Wt 193.7 lb

## 2021-02-14 DIAGNOSIS — C787 Secondary malignant neoplasm of liver and intrahepatic bile duct: Secondary | ICD-10-CM

## 2021-02-14 DIAGNOSIS — C182 Malignant neoplasm of ascending colon: Secondary | ICD-10-CM

## 2021-02-14 DIAGNOSIS — C7931 Secondary malignant neoplasm of brain: Secondary | ICD-10-CM

## 2021-02-14 DIAGNOSIS — C7800 Secondary malignant neoplasm of unspecified lung: Secondary | ICD-10-CM

## 2021-02-14 DIAGNOSIS — C189 Malignant neoplasm of colon, unspecified: Secondary | ICD-10-CM | POA: Diagnosis not present

## 2021-02-14 LAB — BASIC METABOLIC PANEL
BUN: 12 (ref 4–21)
CO2: 21 (ref 13–22)
Chloride: 104 (ref 99–108)
Creatinine: 1.2 — AB (ref 0.5–1.1)
Glucose: 264
Potassium: 3.5 (ref 3.4–5.3)
Sodium: 139 (ref 137–147)

## 2021-02-14 LAB — COMPREHENSIVE METABOLIC PANEL
Albumin: 4.2 (ref 3.5–5.0)
Calcium: 8.9 (ref 8.7–10.7)

## 2021-02-14 LAB — CBC AND DIFFERENTIAL
HCT: 33 — AB (ref 36–46)
Hemoglobin: 11.2 — AB (ref 12.0–16.0)
Neutrophils Absolute: 1.44
Platelets: 127 — AB (ref 150–399)
WBC: 3

## 2021-02-14 LAB — CBC
MCV: 98 (ref 81–99)
RBC: 3.34 — AB (ref 3.87–5.11)

## 2021-02-14 LAB — HEPATIC FUNCTION PANEL
ALT: 31 (ref 7–35)
AST: 30 (ref 13–35)
Alkaline Phosphatase: 149 — AB (ref 25–125)
Bilirubin, Total: 0.4

## 2021-02-14 LAB — TOTAL PROTEIN, URINE DIPSTICK: Protein, ur: NEGATIVE mg/dL

## 2021-02-14 NOTE — Telephone Encounter (Signed)
Per 9/16 LOS, patient scheduled for 9/30 Labs, Follow Up.  Patient entered into her Calendar

## 2021-02-17 ENCOUNTER — Ambulatory Visit: Payer: Self-pay | Admitting: Psychiatry

## 2021-02-17 MED FILL — Dexamethasone Sodium Phosphate Inj 100 MG/10ML: INTRAMUSCULAR | Qty: 1 | Status: AC

## 2021-02-17 MED FILL — Leucovorin Calcium For Inj 350 MG: INTRAMUSCULAR | Qty: 41.8 | Status: AC

## 2021-02-17 MED FILL — Irinotecan HCl Inj 100 MG/5ML (20 MG/ML): INTRAVENOUS | Qty: 14 | Status: AC

## 2021-02-17 MED FILL — Fluorouracil IV Soln 5 GM/100ML (50 MG/ML): INTRAVENOUS | Qty: 100 | Status: AC

## 2021-02-17 MED FILL — Bevacizumab-bvzr IV Soln 400 MG/16ML (For Infusion): INTRAVENOUS | Qty: 18 | Status: AC

## 2021-02-17 MED FILL — Fluorouracil IV Soln 2.5 GM/50ML (50 MG/ML): INTRAVENOUS | Qty: 17 | Status: AC

## 2021-02-17 MED FILL — Fosaprepitant Dimeglumine For IV Infusion 150 MG (Base Eq): INTRAVENOUS | Qty: 5 | Status: AC

## 2021-02-18 ENCOUNTER — Encounter: Payer: Self-pay | Admitting: Oncology

## 2021-02-18 ENCOUNTER — Encounter: Payer: PRIVATE HEALTH INSURANCE | Attending: Psychology | Admitting: Psychology

## 2021-02-18 ENCOUNTER — Other Ambulatory Visit: Payer: Self-pay

## 2021-02-18 ENCOUNTER — Inpatient Hospital Stay: Payer: PRIVATE HEALTH INSURANCE

## 2021-02-18 ENCOUNTER — Encounter: Payer: Self-pay | Admitting: Psychology

## 2021-02-18 VITALS — BP 132/68 | HR 89 | Temp 98.5°F | Resp 18 | Ht 68.0 in | Wt 199.1 lb

## 2021-02-18 DIAGNOSIS — C7931 Secondary malignant neoplasm of brain: Secondary | ICD-10-CM | POA: Diagnosis not present

## 2021-02-18 DIAGNOSIS — F5101 Primary insomnia: Secondary | ICD-10-CM | POA: Diagnosis not present

## 2021-02-18 DIAGNOSIS — G9389 Other specified disorders of brain: Secondary | ICD-10-CM | POA: Insufficient documentation

## 2021-02-18 DIAGNOSIS — G441 Vascular headache, not elsewhere classified: Secondary | ICD-10-CM | POA: Diagnosis not present

## 2021-02-18 DIAGNOSIS — F331 Major depressive disorder, recurrent, moderate: Secondary | ICD-10-CM | POA: Diagnosis not present

## 2021-02-18 DIAGNOSIS — C182 Malignant neoplasm of ascending colon: Secondary | ICD-10-CM

## 2021-02-18 MED ORDER — IRINOTECAN HCL CHEMO INJECTION 100 MG/5ML
135.0000 mg/m2 | Freq: Once | INTRAVENOUS | Status: AC
  Administered 2021-02-18: 280 mg via INTRAVENOUS
  Filled 2021-02-18: qty 14

## 2021-02-18 MED ORDER — LORAZEPAM 1 MG PO TABS
1.0000 mg | ORAL_TABLET | Freq: Once | ORAL | Status: AC
  Administered 2021-02-18: 1 mg via ORAL
  Filled 2021-02-18: qty 1

## 2021-02-18 MED ORDER — INFLUENZA VAC SPLIT QUAD 0.5 ML IM SUSY
0.5000 mL | PREFILLED_SYRINGE | Freq: Once | INTRAMUSCULAR | Status: AC
  Administered 2021-02-18: 0.5 mL via INTRAMUSCULAR
  Filled 2021-02-18: qty 0.5

## 2021-02-18 MED ORDER — SODIUM CHLORIDE 0.9 % IV SOLN
150.0000 mg | Freq: Once | INTRAVENOUS | Status: AC
  Administered 2021-02-18: 150 mg via INTRAVENOUS
  Filled 2021-02-18: qty 150

## 2021-02-18 MED ORDER — FLUOROURACIL CHEMO INJECTION 2.5 GM/50ML
400.0000 mg/m2 | Freq: Once | INTRAVENOUS | Status: AC
  Administered 2021-02-18: 850 mg via INTRAVENOUS
  Filled 2021-02-18: qty 17

## 2021-02-18 MED ORDER — LEUCOVORIN CALCIUM INJECTION 350 MG
400.0000 mg/m2 | Freq: Once | INTRAVENOUS | Status: AC
  Administered 2021-02-18: 836 mg via INTRAVENOUS
  Filled 2021-02-18: qty 41.8

## 2021-02-18 MED ORDER — SODIUM CHLORIDE 0.9 % IV SOLN
5.0000 mg/kg | Freq: Once | INTRAVENOUS | Status: AC
  Administered 2021-02-18: 450 mg via INTRAVENOUS
  Filled 2021-02-18: qty 16

## 2021-02-18 MED ORDER — SODIUM CHLORIDE 0.9 % IV SOLN
2400.0000 mg/m2 | INTRAVENOUS | Status: DC
  Administered 2021-02-18: 5000 mg via INTRAVENOUS
  Filled 2021-02-18: qty 100

## 2021-02-18 MED ORDER — ATROPINE SULFATE 1 MG/ML IJ SOLN
0.5000 mg | Freq: Once | INTRAMUSCULAR | Status: AC | PRN
  Administered 2021-02-18: 0.5 mg via INTRAVENOUS
  Filled 2021-02-18: qty 1

## 2021-02-18 MED ORDER — SODIUM CHLORIDE 0.9 % IV SOLN: Freq: Once | INTRAVENOUS | Status: AC

## 2021-02-18 MED ORDER — PALONOSETRON HCL INJECTION 0.25 MG/5ML
0.2500 mg | Freq: Once | INTRAVENOUS | Status: AC
  Administered 2021-02-18: 0.25 mg via INTRAVENOUS
  Filled 2021-02-18: qty 5

## 2021-02-18 MED ORDER — SODIUM CHLORIDE 0.9 % IV SOLN
10.0000 mg | Freq: Once | INTRAVENOUS | Status: AC
  Administered 2021-02-18: 10 mg via INTRAVENOUS
  Filled 2021-02-18: qty 10

## 2021-02-18 NOTE — Progress Notes (Signed)
Neuropsychological Consultation   Patient:   Shannon [name]   DOB:   [birth year]  MR Number:  [id number]  Location:  [facility] [place], [place] [id number] [place] [address] Dept: [phone]           Date of Service:   02/18/2021  Start Time:   8 AM End Time:   9 AM  Today's visit was an in person visit was conducted in my outpatient clinic office with myself and the patient present.  Provider/Observer:  Ilean Skill, Psy.D.       Clinical Neuropsychologist       Billing Code/Service: 96158/96159  Chief Complaint:    Shannon [name] is a 55 year old female with a past medical history that includes anxiety, depression and OCD.  The patient has been followed by psychiatry for this and continues taking Luvox, fluoxetine, Seroquel and they are considering adding Provigil.  Patient was diagnosed with colon cancer in 2018 with resection followed by chemotherapy.  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  The patient is more recently found new metastasis in her lung and GI system.  The patient was referred for neuropsychological consultation for therapeutic interventions.  The patient has had a long history of depression and now after her brain tumor was removed she has continued with radiation therapy and chemotherapy.  Reason for Service:  Shannon [name] is a 55 year old female with a past medical history that includes anxiety, depression and OCD.  The patient has been followed by psychiatry for this and continues taking Luvox, fluoxetine, Seroquel and they are considering adding Provigil.  Patient was diagnosed with colon cancer in 2018 with resection followed by chemotherapy.  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  The patient is more recently found new metastasis in her lung and GI system.  The patient was referred for neuropsychological consultation for therapeutic interventions.  The patient has had a long history of depression and now after her brain tumor was removed she has continued with radiation therapy and chemotherapy.  The patient reports that she had wanted to get involved in psychotherapeutic interventions and the patient describes her self is having a lifelong history of self sabotaging, anxiety and depression.  The patient is widowed and has stressors of being a single parent.  The patient reports that prior to her brain surgery that she was incapacitated with symptoms including passing out, falling, throwing up and was wheelchair-bound.  She reports that after surgery that she was referred to the inpatient rehabilitation program and has been engaged in outpatient therapies through March and February 2022.  The patient reports that she is continuing to have significant issues with concentration and attention and productivity at work because of fatigue and attentional issues.  She reports that this is causing some financial stressors.  The patient reports that she has a long history of sleep disturbance particularly difficulty falling asleep.  She was prescribed Seroquel (800 mg) at night for sleep and it is helped a great deal for falling asleep but she has difficulty waking up and getting going in the morning.  She describes her appetite is mostly normal.  Memory includes difficulties with short and long-term memory difficulties.  Psychosocial stressors in the past year including her father passing away in July 2021 and  an aunt passing away in December 2021.  Behavioral Observation: Shannon [name]  presents as a 55 y.o.-year-old Right Caucasian Female who appeared her stated age. her dress was Appropriate and she was Well Groomed and her manners were Appropriate to the  situation.  her participation was indicative of Appropriate and Attentive behaviors.  There were physical disabilities noted related to being very careful with her gait and descriptions of balance and coordination issues.  she displayed an appropriate level of cooperation and motivation.     Interactions:    Active Appropriate  Attention:   abnormal and attention span appeared shorter than expected for age  Memory:   within normal limits; recent and remote memory intact  Visuo-spatial:  not examined  Speech (Volume):  normal  Speech:   normal; normal  Thought Process:  Coherent and Relevant  Though Content:  WNL; not suicidal and not homicidal  Orientation:   person, place, time/date and situation  Judgment:   Good  Planning:   Good  Affect:    Depressed  Mood:    Dysphoric  Insight:   Good  Intelligence:   normal  Marital Status/Living: The patient was born and raised in [place] along with 2 siblings.  She is the youngest of the children.  No major childhood illnesses were noted and developmental milestones were reached at the appropriate time.  The patient currently lives with her 73 year old daughter.  The patient was widowed in 2009 after 16 years of marriage.  Her daughter also deals with depression and anxiety that was triggered when the daughter found her father dead at age 64 years old in their home.  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  She has struggled some with her employment as a result of some of the complications from her cancer and chemotherapy treatments and effects it has on attention and concentration and fatigue.  Substance Use:  No concerns of substance abuse are reported.  The patient reports that she has a glass of wine every couple of months and no other substance use.  Education:   The patient completed her masters degree at [employer] and asked very Conservation officer, historic buildings.  Hobbies and interest in the past included music in church but she reports she does not have a lot of hobbies now.  Medical History:   Past Medical History:  Diagnosis Date   Anemia    Iron De   Anxiety    Blood clot in vein    Bowel obstruction ([facility])    Colon cancer ([facility]) 2018   treated with surgery and chemotherapy   Depression    Diabetes mellitus without complication ([facility])    Type II   DVT (deep venous thrombosis) ([facility]) 2019   behind right knee- while she wasa on chemo   GERD (gastroesophageal reflux disease)    History of blood transfusion    History of chemotherapy    History of kidney stones 2012   passed   Hypothyroidism    Obesity          Patient Active Problem List   Diagnosis Date Noted   Metastatic colon cancer to liver ([facility]) 01/24/2021   Carcinoma of colon metastatic to lung ([facility]) 01/24/2021   Diarrhea 10/31/2020   Dehydration 10/31/2020   Cerebellar mass 10/24/2020   Major depressive disorder, recurrent episode, moderate ([facility]) 10/24/2020   Orthostatic hypotension 08/26/2020   Vascular headache  08/26/2020   Colon cancer ([facility]) 07/23/2020   Body mass index (BMI) 31.0-31.9, adult 07/18/2020   Insomnia 07/05/2020   Vertigo, central 07/02/2020   Brain metastases ([facility]) 05/29/2020   Iron deficiency anemia, unspecified 05/13/2020   OCD (obsessive compulsive disorder) 03/30/2018   Malignant neoplasm of hepatic flexure of colon ([facility]) 03/17/2017              Psychiatric History:  The patient has a long history of anxiety and depression and is being followed by psychiatry with current psychotropic medications including fluoxetine, Luvox and Seroquel at night for sleep.  They are looking at starting Provigil to see if it helps with her attention and concentration issues.  Family Med/Psych History:  Family History  Problem Relation Age of Onset   Irritable bowel syndrome Mother    Colon polyps Father    Breast cancer Maternal Grandmother    Diabetes  Maternal Grandmother    Diabetes Maternal Grandfather    Breast cancer Paternal Grandmother    Colon polyps Maternal Uncle    Stomach cancer Neg Hx    Pancreatic cancer Neg Hx    Esophageal cancer Neg Hx    Colon cancer Neg Hx    Impression/DX:  Shannon [name] is a 55 year old female with a past medical history that includes anxiety, depression and OCD.  The patient has been followed by psychiatry for this and continues taking Luvox, fluoxetine, Seroquel and they are considering adding Provigil.  Patient was diagnosed with colon cancer in 2018 with resection followed by chemotherapy.  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  The patient is more recently found new metastasis in her lung and GI system.  The patient was referred for neuropsychological consultation for therapeutic interventions.  The patient has had a long history of depression and now after her brain tumor was removed she has continued with radiation therapy and chemotherapy.   Current status and therapeutic intervention:  02/19/2019: The patient returns today to begin therapeutic interventions around coping and managing with residual effects of her ongoing cancer treatment for metastasizing cancer along with her major depressive disorder etc.  We worked on a wide range of coping and adjustment strategies around resulting depressive issues and difficulties managing and coping with her work duties while she is going through chemotherapy.  The patient reports that the most recent body scans suggest that her current chemotherapy regimen is shrinking the various metastases but they do continue to show up on imaging.  The patient has had to deal with existential types of stress as well as day-to-day living stresses.  She reports that she is fatigued from her chemotherapy but she is generally managing it well.  She reports that she is continuing to work and continuing  to cope and manage the best she can.  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  Diagnosis:    Brain metastases ([facility])  Cerebellar mass  Major depressive disorder, recurrent episode, moderate ([facility])  Vascular headache  Primary insomnia         Electronically Signed   _______________________ Ilean Skill, Psy.D. Clinical Neuropsychologist

## 2021-02-18 NOTE — Patient Instructions (Addendum)
Leucovorin injection What is this medication? LEUCOVORIN (loo koe VOR in) is used to prevent or treat the harmful effects of some medicines. This medicine is used to treat anemia caused by a low amount of folic acid in the body. It is also used with 5-fluorouracil (5-FU) to treat colon cancer. This medicine may be used for other purposes; ask your health care provider or pharmacist if you have questions. What should I tell my care team before I take this medication? They need to know if you have any of these conditions: anemia from low levels of vitamin B-12 in the blood an unusual or allergic reaction to leucovorin, folic acid, other medicines, foods, dyes, or preservatives pregnant or trying to get pregnant breast-feeding How should I use this medication? This medicine is for injection into a muscle or into a vein. It is given by a health care professional in a hospital or clinic setting. Talk to your pediatrician regarding the use of this medicine in children. Special care may be needed. Overdosage: If you think you have taken too much of this medicine contact a poison control center or emergency room at once. NOTE: This medicine is only for you. Do not share this medicine with others. What if I miss a dose? This does not apply. What may interact with this medication? capecitabine fluorouracil phenobarbital phenytoin primidone trimethoprim-sulfamethoxazole This list may not describe all possible interactions. Give your health care provider a list of all the medicines, herbs, non-prescription drugs, or dietary supplements you use. Also tell them if you smoke, drink alcohol, or use illegal drugs. Some items may interact with your medicine. What should I watch for while using this medication? Your condition will be monitored carefully while you are receiving this medicine. This medicine may increase the side effects of 5-fluorouracil, 5-FU. Tell your doctor or health care professional if  you have diarrhea or mouth sores that do not get better or that get worse. What side effects may I notice from receiving this medication? Side effects that you should report to your doctor or health care professional as soon as possible: allergic reactions like skin rash, itching or hives, swelling of the face, lips, or tongue breathing problems fever, infection mouth sores unusual bleeding or bruising unusually weak or tired Side effects that usually do not require medical attention (report to your doctor or health care professional if they continue or are bothersome): constipation or diarrhea loss of appetite nausea, vomiting This list may not describe all possible side effects. Call your doctor for medical advice about side effects. You may report side effects to FDA at 1-800-FDA-1088. Where should I keep my medication? This drug is given in a hospital or clinic and will not be stored at home. NOTE: This sheet is a summary. It may not cover all possible information. If you have questions about this medicine, talk to your doctor, pharmacist, or health care provider.  2022 Elsevier/Gold Standard (2007-11-22 16:50:29) Bevacizumab injection What is this medication? BEVACIZUMAB (be va SIZ yoo mab) is a monoclonal antibody. It is used to treat many types of cancer. This medicine may be used for other purposes; ask your health care provider or pharmacist if you have questions. COMMON BRAND NAME(S): Avastin, MVASI, Noah Charon What should I tell my care team before I take this medication? They need to know if you have any of these conditions: diabetes heart disease high blood pressure history of coughing up blood prior anthracycline chemotherapy (e.g., doxorubicin, daunorubicin, epirubicin) recent or ongoing radiation  therapy recent or planning to have surgery stroke an unusual or allergic reaction to bevacizumab, hamster proteins, mouse proteins, other medicines, foods, dyes, or  preservatives pregnant or trying to get pregnant breast-feeding How should I use this medication? This medicine is for infusion into a vein. It is given by a health care professional in a hospital or clinic setting. Talk to your pediatrician regarding the use of this medicine in children. Special care may be needed. Overdosage: If you think you have taken too much of this medicine contact a poison control center or emergency room at once. NOTE: This medicine is only for you. Do not share this medicine with others. What if I miss a dose? It is important not to miss your dose. Call your doctor or health care professional if you are unable to keep an appointment. What may interact with this medication? Interactions are not expected. This list may not describe all possible interactions. Give your health care provider a list of all the medicines, herbs, non-prescription drugs, or dietary supplements you use. Also tell them if you smoke, drink alcohol, or use illegal drugs. Some items may interact with your medicine. What should I watch for while using this medication? Your condition will be monitored carefully while you are receiving this medicine. You will need important blood work and urine testing done while you are taking this medicine. This medicine may increase your risk to bruise or bleed. Call your doctor or health care professional if you notice any unusual bleeding. Before having surgery, talk to your health care provider to make sure it is ok. This drug can increase the risk of poor healing of your surgical site or wound. You will need to stop this drug for 28 days before surgery. After surgery, wait at least 28 days before restarting this drug. Make sure the surgical site or wound is healed enough before restarting this drug. Talk to your health care provider if questions. Do not become pregnant while taking this medicine or for 6 months after stopping it. Women should inform their doctor if  they wish to become pregnant or think they might be pregnant. There is a potential for serious side effects to an unborn child. Talk to your health care professional or pharmacist for more information. Do not breast-feed an infant while taking this medicine and for 6 months after the last dose. This medicine has caused ovarian failure in some women. This medicine may interfere with the ability to have a child. You should talk to your doctor or health care professional if you are concerned about your fertility. What side effects may I notice from receiving this medication? Side effects that you should report to your doctor or health care professional as soon as possible: allergic reactions like skin rash, itching or hives, swelling of the face, lips, or tongue chest pain or chest tightness chills coughing up blood high fever seizures severe constipation signs and symptoms of bleeding such as bloody or black, tarry stools; red or dark-brown urine; spitting up blood or brown material that looks like coffee grounds; red spots on the skin; unusual bruising or bleeding from the eye, gums, or nose signs and symptoms of a blood clot such as breathing problems; chest pain; severe, sudden headache; pain, swelling, warmth in the leg signs and symptoms of a stroke like changes in vision; confusion; trouble speaking or understanding; severe headaches; sudden numbness or weakness of the face, arm or leg; trouble walking; dizziness; loss of balance or coordination stomach  pain sweating swelling of legs or ankles vomiting weight gain Side effects that usually do not require medical attention (report to your doctor or health care professional if they continue or are bothersome): back pain changes in taste decreased appetite dry skin nausea tiredness This list may not describe all possible side effects. Call your doctor for medical advice about side effects. You may report side effects to FDA at  1-800-FDA-1088. Where should I keep my medication? This drug is given in a hospital or clinic and will not be stored at home. NOTE: This sheet is a summary. It may not cover all possible information. If you have questions about this medicine, talk to your doctor, pharmacist, or health care provider.  2022 Elsevier/Gold Standard (2019-03-15 10:50:46) [facility]  Discharge Instructions: Thank you for choosing [facility] to provide your oncology and hematology care.  If you have a lab appointment with the [facility], please go directly to the [facility] and check in at the registration area.   Wear comfortable clothing and clothing appropriate for easy access to any Portacath or PICC line.   We strive to give you quality time with your provider. You may need to reschedule your appointment if you arrive late (15 or more minutes).  Arriving late affects you and other patients whose appointments are after yours.  Also, if you miss three or more appointments without notifying the office, you may be dismissed from the clinic at the provider's discretion.      For prescription refill requests, have your pharmacy contact our office and allow 72 hours for refills to be completed.    Today you received the following chemotherapy and/or immunotherapy agents folriri/bev   To help prevent nausea and vomiting after your treatment, we encourage you to take your nausea medication as directed.  BELOW ARE SYMPTOMS THAT SHOULD BE REPORTED IMMEDIATELY: *FEVER GREATER THAN 100.4 F (38 C) OR HIGHER *CHILLS OR SWEATING *NAUSEA AND VOMITING THAT IS NOT CONTROLLED WITH YOUR NAUSEA MEDICATION *UNUSUAL SHORTNESS OF BREATH *UNUSUAL BRUISING OR BLEEDING *URINARY PROBLEMS (pain or burning when urinating, or frequent urination) *BOWEL PROBLEMS (unusual diarrhea, constipation, pain near the anus) TENDERNESS IN MOUTH AND THROAT WITH OR WITHOUT PRESENCE OF ULCERS (sore  throat, sores in mouth, or a toothache) UNUSUAL RASH, SWELLING OR PAIN  UNUSUAL VAGINAL DISCHARGE OR ITCHING   Items with * indicate a potential emergency and should be followed up as soon as possible or go to the Emergency Department if any problems should occur.  Please show the CHEMOTHERAPY ALERT CARD or IMMUNOTHERAPY ALERT CARD at check-in to the Emergency Department and triage nurse.  Should you have questions after your visit or need to cancel or reschedule your appointment, please contact [facility]  Dept: [phone]  and follow the prompts.  Office hours are 8:00 a.m. to 4:30 p.m. Monday - Friday. Please note that voicemails left after 4:00 p.m. may not be returned until the following business day.  We are closed weekends and major holidays. You have access to a nurse at all times for urgent questions. Please call the main number to the clinic Dept: [phone] and follow the prompts.  For any non-urgent questions, you may also contact your provider using MyChart. We now offer e-Visits for anyone 34 and older to request care online for non-urgent symptoms. For details visit [email or web address].   Also download the MyChart app! Go to the app store, search "MyChart", open the  app, select [facility], and log in with your MyChart username and password.  Due to Covid, a mask is required upon entering the hospital/clinic. If you do not have a mask, one will be given to you upon arrival. For doctor visits, patients may have 1 support person aged 49 or older with them. For treatment visits, patients cannot have anyone with them due to current Covid guidelines and our immunocompromised population.   Fluorouracil, 5-FU injection What is this medication? FLUOROURACIL, 5-FU (flure oh YOOR a sil) is a chemotherapy drug. It slows the growth of cancer cells. This medicine is used to treat many types of cancer like breast cancer, colon or rectal cancer, pancreatic cancer,  and stomach cancer. This medicine may be used for other purposes; ask your health care provider or pharmacist if you have questions. COMMON BRAND NAME(S): Adrucil What should I tell my care team before I take this medication? They need to know if you have any of these conditions: blood disorders dihydropyrimidine dehydrogenase (DPD) deficiency infection (especially a virus infection such as chickenpox, cold sores, or herpes) kidney disease liver disease malnourished, poor nutrition recent or ongoing radiation therapy an unusual or allergic reaction to fluorouracil, other chemotherapy, other medicines, foods, dyes, or preservatives pregnant or trying to get pregnant breast-feeding How should I use this medication? This drug is given as an infusion or injection into a vein. It is administered in a hospital or clinic by a specially trained health care professional. Talk to your pediatrician regarding the use of this medicine in children. Special care may be needed. Overdosage: If you think you have taken too much of this medicine contact a poison control center or emergency room at once. NOTE: This medicine is only for you. Do not share this medicine with others. What if I miss a dose? It is important not to miss your dose. Call your doctor or health care professional if you are unable to keep an appointment. What may interact with this medication? Do not take this medicine with any of the following medications: live virus vaccines This medicine may also interact with the following medications: medicines that treat or prevent blood clots like warfarin, enoxaparin, and dalteparin This list may not describe all possible interactions. Give your health care provider a list of all the medicines, herbs, non-prescription drugs, or dietary supplements you use. Also tell them if you smoke, drink alcohol, or use illegal drugs. Some items may interact with your medicine. What should I watch for while  using this medication? Visit your doctor for checks on your progress. This drug may make you feel generally unwell. This is not uncommon, as chemotherapy can affect healthy cells as well as cancer cells. Report any side effects. Continue your course of treatment even though you feel ill unless your doctor tells you to stop. In some cases, you may be given additional medicines to help with side effects. Follow all directions for their use. Call your doctor or health care professional for advice if you get a fever, chills or sore throat, or other symptoms of a cold or flu. Do not treat yourself. This drug decreases your body's ability to fight infections. Try to avoid being around people who are sick. This medicine may increase your risk to bruise or bleed. Call your doctor or health care professional if you notice any unusual bleeding. Be careful brushing and flossing your teeth or using a toothpick because you may get an infection or bleed more easily. If you have  any dental work done, tell your dentist you are receiving this medicine. Avoid taking products that contain aspirin, acetaminophen, ibuprofen, naproxen, or ketoprofen unless instructed by your doctor. These medicines may hide a fever. Do not become pregnant while taking this medicine. Women should inform their doctor if they wish to become pregnant or think they might be pregnant. There is a potential for serious side effects to an unborn child. Talk to your health care professional or pharmacist for more information. Do not breast-feed an infant while taking this medicine. Men should inform their doctor if they wish to father a child. This medicine may lower sperm counts. Do not treat diarrhea with over the counter products. Contact your doctor if you have diarrhea that lasts more than 2 days or if it is severe and watery. This medicine can make you more sensitive to the sun. Keep out of the sun. If you cannot avoid being in the sun, wear  protective clothing and use sunscreen. Do not use sun lamps or tanning beds/booths. What side effects may I notice from receiving this medication? Side effects that you should report to your doctor or health care professional as soon as possible: allergic reactions like skin rash, itching or hives, swelling of the face, lips, or tongue low blood counts - this medicine may decrease the number of white blood cells, red blood cells and platelets. You may be at increased risk for infections and bleeding. signs of infection - fever or chills, cough, sore throat, pain or difficulty passing urine signs of decreased platelets or bleeding - bruising, pinpoint red spots on the skin, black, tarry stools, blood in the urine signs of decreased red blood cells - unusually weak or tired, fainting spells, lightheadedness breathing problems changes in vision chest pain mouth sores nausea and vomiting pain, swelling, redness at site where injected pain, tingling, numbness in the hands or feet redness, swelling, or sores on hands or feet stomach pain unusual bleeding Side effects that usually do not require medical attention (report to your doctor or health care professional if they continue or are bothersome): changes in finger or toe nails diarrhea dry or itchy skin hair loss headache loss of appetite sensitivity of eyes to the light stomach upset unusually teary eyes This list may not describe all possible side effects. Call your doctor for medical advice about side effects. You may report side effects to FDA at 1-800-FDA-1088. Where should I keep my medication? This drug is given in a hospital or clinic and will not be stored at home. NOTE: This sheet is a summary. It may not cover all possible information. If you have questions about this medicine, talk to your doctor, pharmacist, or health care provider.  2022 Elsevier/Gold Standard (2019-04-18 15:00:03)

## 2021-02-20 ENCOUNTER — Other Ambulatory Visit: Payer: Self-pay

## 2021-02-20 ENCOUNTER — Inpatient Hospital Stay: Payer: PRIVATE HEALTH INSURANCE

## 2021-02-20 VITALS — BP 123/75 | HR 100 | Temp 97.7°F | Resp 18 | Ht 68.0 in | Wt 194.2 lb

## 2021-02-20 DIAGNOSIS — C182 Malignant neoplasm of ascending colon: Secondary | ICD-10-CM

## 2021-02-20 DIAGNOSIS — C7931 Secondary malignant neoplasm of brain: Secondary | ICD-10-CM | POA: Diagnosis not present

## 2021-02-20 MED ORDER — SODIUM CHLORIDE 0.9% FLUSH
10.0000 mL | INTRAVENOUS | Status: DC | PRN
Start: 2021-02-20 — End: 2021-02-20
  Administered 2021-02-20: 10 mL

## 2021-02-20 MED ORDER — HEPARIN SOD (PORK) LOCK FLUSH 100 UNIT/ML IV SOLN
500.0000 [IU] | Freq: Once | INTRAVENOUS | Status: AC | PRN
  Administered 2021-02-20: 500 [IU]

## 2021-02-20 NOTE — Patient Instructions (Signed)
[facility]  Discharge Instructions: Thank you for choosing [facility] to provide your oncology and hematology care.  If you have a lab appointment with the Cancer Center, please go directly to the Cancer Center and check in at the registration area.   Wear comfortable clothing and clothing appropriate for easy access to any Portacath or PICC line.   We strive to give you quality time with your provider. You may need to reschedule your appointment if you arrive late (15 or more minutes).  Arriving late affects you and other patients whose appointments are after yours.  Also, if you miss three or more appointments without notifying the office, you may be dismissed from the clinic at the provider's discretion.      For prescription refill requests, have your pharmacy contact our office and allow 72 hours for refills to be completed.    Today you received the following chemotherapy and/or immunotherapy agents Fluorouracil    To help prevent nausea and vomiting after your treatment, we encourage you to take your nausea medication as directed.  BELOW ARE SYMPTOMS THAT SHOULD BE REPORTED IMMEDIATELY: *FEVER GREATER THAN 100.4 F (38 C) OR HIGHER *CHILLS OR SWEATING *NAUSEA AND VOMITING THAT IS NOT CONTROLLED WITH YOUR NAUSEA MEDICATION *UNUSUAL SHORTNESS OF BREATH *UNUSUAL BRUISING OR BLEEDING *URINARY PROBLEMS (pain or burning when urinating, or frequent urination) *BOWEL PROBLEMS (unusual diarrhea, constipation, pain near the anus) TENDERNESS IN MOUTH AND THROAT WITH OR WITHOUT PRESENCE OF ULCERS (sore throat, sores in mouth, or a toothache) UNUSUAL RASH, SWELLING OR PAIN  UNUSUAL VAGINAL DISCHARGE OR ITCHING   Items with * indicate a potential emergency and should be followed up as soon as possible or go to the Emergency Department if any problems should occur.  Please show the CHEMOTHERAPY ALERT CARD or IMMUNOTHERAPY ALERT CARD at check-in to the  Emergency Department and triage nurse.  Should you have questions after your visit or need to cancel or reschedule your appointment, please contact [facility]  Dept: [phone]  and follow the prompts.  Office hours are 8:00 a.m. to 4:30 p.m. Monday - Friday. Please note that voicemails left after 4:00 p.m. may not be returned until the following business day.  We are closed weekends and major holidays. You have access to a nurse at all times for urgent questions. Please call the main number to the clinic Dept: [phone] and follow the prompts.  For any non-urgent questions, you may also contact your provider using MyChart. We now offer e-Visits for anyone 18 and older to request care online for non-urgent symptoms. For details visit [email or web address].   Also download the MyChart app! Go to the app store, search "MyChart", open the app, select [facility], and log in with your MyChart username and password.  Due to Covid, a mask is required upon entering the hospital/clinic. If you do not have a mask, one will be given to you upon arrival. For doctor visits, patients may have 1 support person aged 18 or older with them. For treatment visits, patients cannot have anyone with them due to current Covid guidelines and our immunocompromised population.    

## 2021-02-20 NOTE — Progress Notes (Signed)
1351:PT STABLE AT TIME OF DISCHARGE ?

## 2021-02-21 NOTE — Progress Notes (Signed)
Shannon [name]  [address] [place],  Shannon [name]  [address] [phone]  Clinic Day:  02/28/2021  Referring physician: Greig Right, MD  This document serves as a record of services personally performed by Dequincy Macarthur Critchley, MD. It was created on their behalf by [facility], a trained medical scribe. The creation of this record is based on the scribe's personal observations and the provider's statements to them.  HISTORY OF PRESENT ILLNESS:  The patient is a 55 y.o. female with metastatic colon cancer, which includes spread of disease to her abdominal cavity, lungs, brain and right adrenal gland. She comes in today prior to a 16th cycle of FOLFIRI/Avastin.  The patient claims to have toleraed her 15th cycle of treatment fairly well.  Her diarrhea remains prominent despite receiving atropine to offset the effects of irinotecan, whose dose has been decreased.  She has also had more nausea/vomiting lately, for which her Compazine helps control.  She also claims her appetite and lack of taste have become more problematic.  Depression has also become more of an issue, as it pertains to her disease and its impact on other aspects of her life.  She is seeing a therapist.   With respect to her colon cancer history, she is status post a right hemicolectomy in early October 2018, followed by 12 cycles of adjuvant chemotherapy, which were completed in April 2019.  In December 2021, she underwent a left cerebellar metastatectomy, whose pathology was consistent with metastatic colon cancer.  Tumor testing did come back MMR normal.  CT scans recently showed evidence of her cancer being in multiple locations, which has her on palliative chemotherapy at this time.    PHYSICAL EXAM:  Blood pressure 105/71, pulse (!) 114, temperature 97.8 F (36.6 C), resp. rate 16, height 5' 8"  (1.727 m), weight 187 lb 14.4 oz (85.2 kg), last menstrual period 02/22/2019, SpO2 97 %. Wt Readings  from Last 3 Encounters:  02/28/21 187 lb 14.4 oz (85.2 kg)  02/25/21 192 lb (87.1 kg)  02/20/21 194 lb 4 oz (88.1 kg)   Body mass index is 28.57 kg/m. Performance status (ECOG): 1 Physical Exam Constitutional:      General: She is not in acute distress.    Appearance: Normal appearance. She is normal weight. She is not ill-appearing.  HENT:     Head: Normocephalic and atraumatic.  Eyes:     General: No scleral icterus.    Extraocular Movements: Extraocular movements intact.     Conjunctiva/sclera: Conjunctivae normal.     Pupils: Pupils are equal, round, and reactive to light.  Cardiovascular:     Rate and Rhythm: Regular rhythm. Tachycardia present.     Pulses: Normal pulses.     Heart sounds: Normal heart sounds. No murmur heard.   No friction rub. No gallop.  Pulmonary:     Effort: Pulmonary effort is normal. No respiratory distress.     Breath sounds: Normal breath sounds.  Abdominal:     General: Bowel sounds are normal. There is no distension.     Palpations: Abdomen is soft. There is no hepatomegaly, splenomegaly or mass.     Tenderness: There is no abdominal tenderness.  Musculoskeletal:        General: Normal range of motion.     Cervical back: Normal range of motion and neck supple.     Right lower leg: No edema.     Left lower leg: No edema.  Lymphadenopathy:  Cervical: No cervical adenopathy.  Skin:    General: Skin is warm and dry.  Neurological:     General: No focal deficit present.     Mental Status: She is alert and oriented to person, place, and time. Mental status is at baseline.  Psychiatric:        Mood and Affect: Mood normal.        Behavior: Behavior normal.        Thought Content: Thought content normal.        Judgment: Judgment normal.    LABS:    Ref. Range 02/28/2021 00:00  Sodium Latest Ref Range: 137 - 147  137  Potassium Latest Ref Range: 3.4 - 5.3  3.7  Chloride Latest Ref Range: 99 - 108  100  CO2 Latest Ref Range: 13 - 22   22  Glucose Unknown 313  BUN Latest Ref Range: 4 - 21  19  Creatinine Latest Ref Range: 0.5 - 1.1  1.5 (A)  Calcium Latest Ref Range: 8.7 - 10.7  9.4  Alkaline Phosphatase Latest Ref Range: 25 - 125  198 (A)  Albumin Latest Ref Range: 3.5 - 5.0  4.3  AST Latest Ref Range: 13 - 35  28  ALT Latest Ref Range: 7 - 35  27  Bilirubin, Total Unknown 0.3  WBC Unknown 6.3  RBC Latest Ref Range: 3.87 - 5.11  3.8 (A)  Hemoglobin Latest Ref Range: 12.0 - 16.0  12.4  HCT Latest Ref Range: 36 - 46  38  Platelets Latest Ref Range: 150 - 399  134 (A)  NEUT# Unknown 3.78    ASSESSMENT & PLAN:  Assessment/Plan:  A 55 y.o. female with metastatic colon cancer.  She will proceed with her 16th cycle of FOLFIRI/Avastin next week. As there appear to be issues with dehydration related to her diarrhea and lack of appetite, I will arrange for her to receive 2 L of normal saline.  The patient knows to continue seeing her therapist regarding her depression.  I have also encouraged her to use nausea medication a few minutes before eating. I have also told her to taste a lemon to reinvigorate her taste buds before eating.  Despite some of her current problems, she clinically continues to look great.  Per her request, scans are being spaced out to where she will receive one after her 18th cycle of treatment (ie.  After every 6 cycles).  I will see her back in 2 weeks before she heads into her 17th cycle of  FOLFIRI/Avastin.  The patient understands all the plans discussed today and is in agreement with them.     I, Rita Ohara, am acting as scribe for Marice Potter, MD    I have reviewed this report as typed by the medical scribe, and it is complete and accurate.  Dequincy Macarthur Critchley, MD

## 2021-02-25 ENCOUNTER — Inpatient Hospital Stay: Payer: PRIVATE HEALTH INSURANCE

## 2021-02-25 ENCOUNTER — Encounter: Payer: Self-pay | Admitting: Psychology

## 2021-02-25 ENCOUNTER — Other Ambulatory Visit: Payer: Self-pay

## 2021-02-25 ENCOUNTER — Encounter (HOSPITAL_BASED_OUTPATIENT_CLINIC_OR_DEPARTMENT_OTHER): Payer: PRIVATE HEALTH INSURANCE | Admitting: Psychology

## 2021-02-25 VITALS — BP 121/73 | HR 92 | Temp 97.8°F | Resp 18 | Ht 68.0 in | Wt 192.0 lb

## 2021-02-25 DIAGNOSIS — F331 Major depressive disorder, recurrent, moderate: Secondary | ICD-10-CM | POA: Diagnosis not present

## 2021-02-25 DIAGNOSIS — F5101 Primary insomnia: Secondary | ICD-10-CM

## 2021-02-25 DIAGNOSIS — C182 Malignant neoplasm of ascending colon: Secondary | ICD-10-CM

## 2021-02-25 DIAGNOSIS — G441 Vascular headache, not elsewhere classified: Secondary | ICD-10-CM | POA: Diagnosis not present

## 2021-02-25 DIAGNOSIS — C7931 Secondary malignant neoplasm of brain: Secondary | ICD-10-CM | POA: Diagnosis not present

## 2021-02-25 DIAGNOSIS — G9389 Other specified disorders of brain: Secondary | ICD-10-CM

## 2021-02-25 MED ORDER — FILGRASTIM-SNDZ 480 MCG/0.8ML IJ SOSY
480.0000 ug | PREFILLED_SYRINGE | Freq: Once | INTRAMUSCULAR | Status: AC
  Administered 2021-02-25: 480 ug via SUBCUTANEOUS
  Filled 2021-02-25: qty 0.8

## 2021-02-25 NOTE — Progress Notes (Signed)
Neuropsychological Consultation   Patient:   Shannon [name]   DOB:   08-28-1965  MR Number:  [id number]  Location:  [facility] [place], [place] [id number] [address] Dept: [phone]           Date of Service:   02/25/2021  Start Time:   8 AM End Time:   9 AM  Today's visit was an in person visit was conducted in my outpatient clinic office with myself and the patient present.  Provider/Observer:  Ilean Skill, Psy.D.       Clinical Neuropsychologist       Billing Code/Service: 96158/96159  Chief Complaint:    Shannon [name] is a 55 year old female with a past medical history that includes anxiety, depression and OCD.  The patient has been followed by psychiatry for this and continues taking Luvox, fluoxetine, Seroquel and they are considering adding Provigil.  Patient was diagnosed with colon cancer in 2018 with resection followed by chemotherapy.  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  The patient is more recently found new metastasis in her lung and GI system.  The patient was referred for neuropsychological consultation for therapeutic interventions.  The patient has had a long history of depression and now after her brain tumor was removed she has continued with radiation therapy and chemotherapy.  Reason for Service:  Shannon [name] is a 55 year old female with a past medical history that includes anxiety, depression and OCD.  The patient has been followed by psychiatry for this and continues taking Luvox, fluoxetine, Seroquel and they are considering adding Provigil.  Patient was diagnosed with colon cancer in 2018 with resection followed by chemotherapy.  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  The patient is more recently found new metastasis in her lung and GI system.  The patient was referred for neuropsychological consultation for therapeutic interventions.  The patient has had a long history of depression and now after her brain tumor was removed she has continued with radiation therapy and chemotherapy.  The patient reports that she had wanted to get involved in psychotherapeutic interventions and the patient describes her self is having a lifelong history of self sabotaging, anxiety and depression.  The patient is widowed and has stressors of being a single parent.  The patient reports that prior to her brain surgery that she was incapacitated with symptoms including passing out, falling, throwing up and was wheelchair-bound.  She reports that after surgery that she was referred to the inpatient rehabilitation program and has been engaged in outpatient therapies through March and February 2022.  The patient reports that she is continuing to have significant issues with concentration and attention and productivity at work because of fatigue and attentional issues.  She reports that this is causing some financial stressors.  The patient reports that she has a long history of sleep disturbance particularly difficulty falling asleep.  She was prescribed Seroquel (800 mg) at night for sleep and it is helped a great deal for falling asleep but she has difficulty waking up and getting going in the morning.  She describes her appetite is mostly normal.  Memory includes difficulties with short and long-term memory difficulties.  Psychosocial stressors in the past year including her father passing away in July 2021 and  an aunt passing away in December 2021.  Behavioral Observation: Shannon [name]  presents as a 55 y.o.-year-old Right Caucasian Female who appeared her stated age. her dress was Appropriate and she was Well Groomed and her manners were Appropriate to the  situation.  her participation was indicative of Appropriate and Attentive behaviors.  There were physical disabilities noted related to being very careful with her gait and descriptions of balance and coordination issues.  she displayed an appropriate level of cooperation and motivation.     Interactions:    Active Appropriate  Attention:   abnormal and attention span appeared shorter than expected for age  Memory:   within normal limits; recent and remote memory intact  Visuo-spatial:  not examined  Speech (Volume):  normal  Speech:   normal; normal  Thought Process:  Coherent and Relevant  Though Content:  WNL; not suicidal and not homicidal  Orientation:   person, place, time/date and situation  Judgment:   Good  Planning:   Good  Affect:    Depressed  Mood:    Dysphoric  Insight:   Good  Intelligence:   normal  Marital Status/Living: The patient was born and raised in [place] along with 2 siblings.  She is the youngest of the children.  No major childhood illnesses were noted and developmental milestones were reached at the appropriate time.  The patient currently lives with her 22 year old daughter.  The patient was widowed in 2009 after 16 years of marriage.  Her daughter also deals with depression and anxiety that was triggered when the daughter found her father dead at age 36 years old in their home.  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  She has struggled some with her employment as a result of some of the complications from her cancer and chemotherapy treatments and effects it has on attention and concentration and fatigue.  Substance Use:  No concerns of substance abuse are reported.  The patient reports that she has a glass of wine every couple of months and no other substance use.  Education:   The patient completed her masters degree at [employer] and asked very Conservation officer, historic buildings.  Hobbies and interest in the past included music in church but she reports she does not have a lot of hobbies now.  Medical History:   Past Medical History:  Diagnosis Date   Anemia    Iron De   Anxiety    Blood clot in vein    Bowel obstruction ([place])    Colon cancer ([place]) 2018   treated with surgery and chemotherapy   Depression    Diabetes mellitus without complication ([place])    Type II   DVT (deep venous thrombosis) ([place]) 2019   behind right knee- while she wasa on chemo   GERD (gastroesophageal reflux disease)    History of blood transfusion    History of chemotherapy    History of kidney stones 2012   passed   Hypothyroidism    Obesity          Patient Active Problem List   Diagnosis Date Noted   Metastatic colon cancer to liver ([place]) 01/24/2021   Carcinoma of colon metastatic to lung ([place]) 01/24/2021   Diarrhea 10/31/2020   Dehydration 10/31/2020   Cerebellar mass 10/24/2020   Major depressive disorder, recurrent episode, moderate ([place]) 10/24/2020   Orthostatic hypotension 08/26/2020   Vascular headache  08/26/2020   Colon cancer ([place]) 07/23/2020   Body mass index (BMI) 31.0-31.9, adult 07/18/2020   Insomnia 07/05/2020   Vertigo, central 07/02/2020   Brain metastases ([place]) 05/29/2020   Iron deficiency anemia, unspecified 05/13/2020   OCD (obsessive compulsive disorder) 03/30/2018   Malignant neoplasm of hepatic flexure of colon ([place]) 03/17/2017              Psychiatric History:  The patient has a long history of anxiety and depression and is being followed by psychiatry with current psychotropic medications including fluoxetine, Luvox and Seroquel at night for sleep.  They are looking at starting Provigil to see if it helps with her attention and concentration issues.  Family Med/Psych History:  Family History  Problem Relation Age of Onset   Irritable bowel syndrome Mother    Colon polyps Father    Breast cancer Maternal Grandmother    Diabetes  Maternal Grandmother    Diabetes Maternal Grandfather    Breast cancer Paternal Grandmother    Colon polyps Maternal Uncle    Stomach cancer Neg Hx    Pancreatic cancer Neg Hx    Esophageal cancer Neg Hx    Colon cancer Neg Hx    Impression/DX:  Shannon [name] is a 55 year old female with a past medical history that includes anxiety, depression and OCD.  The patient has been followed by psychiatry for this and continues taking Luvox, fluoxetine, Seroquel and they are considering adding Provigil.  Patient was diagnosed with colon cancer in 2018 with resection followed by chemotherapy.  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  The patient is more recently found new metastasis in her lung and GI system.  The patient was referred for neuropsychological consultation for therapeutic interventions.  The patient has had a long history of depression and now after her brain tumor was removed she has continued with radiation therapy and chemotherapy.   Current status and therapeutic intervention:  02/18/2021: The patient returns today to begin therapeutic interventions around coping and managing with residual effects of her ongoing cancer treatment for metastasizing cancer along with her major depressive disorder etc.  We worked on a wide range of coping and adjustment strategies around resulting depressive issues and difficulties managing and coping with her work duties while she is going through chemotherapy.  The patient reports that the most recent body scans suggest that her current chemotherapy regimen is shrinking the various metastases but they do continue to show up on imaging.  The patient has had to deal with existential types of stress as well as day-to-day living stresses.  She reports that she is fatigued from her chemotherapy but she is generally managing it well.  She reports that she is continuing to work and continuing  to cope and manage the best she can.  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  02/25/2021: 8 AM-9 AM today's visit was an in person visit was conducted in my outpatient clinic office with the patient myself present.  The patient reports that she has been working on some of the initial coping and adjustment issues we have been dealing with as far as therapeutic interventions around her ongoing cancer treatment for metastasizing cancer and her major depressive disorder.  We worked on issues related to cognitive and behavioral interventions as well as more foundational issues related to diet, exercise and sleep pattern.  The patient remains highly motivated and fully participating  in therapeutic efforts.  The patient will return in 2 weeks.  Diagnosis:    Brain metastases ([place])  Cerebellar mass  Major depressive disorder, recurrent episode, moderate ([facility])  Vascular headache  Primary insomnia         Electronically Signed   _______________________ Ilean Skill, Psy.D. Clinical Neuropsychologist

## 2021-02-25 NOTE — Progress Notes (Signed)
Discharged home, stable  

## 2021-02-25 NOTE — Patient Instructions (Signed)
Daughter provided transportation for appt today.   Filgrastim, G-CSF injection What is this medication? FILGRASTIM, G-CSF (fil GRA stim) is a granulocyte colony-stimulating factor that stimulates the growth of neutrophils, a type of white blood cell (WBC) important in the body's fight against infection. It is used to reduce the incidence of fever and infection in patients with certain types of cancer who are receiving chemotherapy that affects the bone marrow, to stimulate blood cell production for removal of WBCs from the body prior to a bone marrow transplantation, to reduce the incidence of fever and infection in patients who have severe chronic neutropenia, and to improve survival outcomes following high-dose radiation exposure that is toxic to the bone marrow. This medicine may be used for other purposes; ask your health care provider or pharmacist if you have questions. COMMON BRAND NAME(S): Neupogen, Nivestym, Releuko, Zarxio What should I tell my care team before I take this medication? They need to know if you have any of these conditions: kidney disease latex allergy ongoing radiation therapy sickle cell disease an unusual or allergic reaction to filgrastim, pegfilgrastim, other medicines, foods, dyes, or preservatives pregnant or trying to get pregnant breast-feeding How should I use this medication? This medicine is for injection under the skin or infusion into a vein. As an infusion into a vein, it is usually given by a health care professional in a hospital or clinic setting. If you get this medicine at home, you will be taught how to prepare and give this medicine. Refer to the Instructions for Use that come with your medication packaging. Use exactly as directed. Take your medicine at regular intervals. Do not take your medicine more often than directed. It is important that you put your used needles and syringes in a special sharps container. Do not put them in a trash can. If you  do not have a sharps container, call your pharmacist or healthcare provider to get one. Talk to your pediatrician regarding the use of this medicine in children. While this drug may be prescribed for children as young as 7 months for selected conditions, precautions do apply. Overdosage: If you think you have taken too much of this medicine contact a poison control center or emergency room at once. NOTE: This medicine is only for you. Do not share this medicine with others. What if I miss a dose? It is important not to miss your dose. Call your doctor or health care professional if you miss a dose. What may interact with this medication? This medicine may interact with the following medications: medicines that may cause a release of neutrophils, such as lithium This list may not describe all possible interactions. Give your health care provider a list of all the medicines, herbs, non-prescription drugs, or dietary supplements you use. Also tell them if you smoke, drink alcohol, or use illegal drugs. Some items may interact with your medicine. What should I watch for while using this medication? Your condition will be monitored carefully while you are receiving this medicine. You may need blood work done while you are taking this medicine. Talk to your health care provider about your risk of cancer. You may be more at risk for certain types of cancer if you take this medicine. What side effects may I notice from receiving this medication? Side effects that you should report to your doctor or health care professional as soon as possible: allergic reactions like skin rash, itching or hives, swelling of the face, lips, or tongue back pain  dizziness or feeling faint fever pain, redness, or irritation at site where injected pinpoint red spots on the skin shortness of breath or breathing problems signs and symptoms of kidney injury like trouble passing urine, change in the amount of urine, or red or  dark-brown urine stomach or side pain, or pain at the shoulder swelling tiredness unusual bleeding or bruising Side effects that usually do not require medical attention (report to your doctor or health care professional if they continue or are bothersome): bone pain cough diarrhea hair loss headache muscle pain This list may not describe all possible side effects. Call your doctor for medical advice about side effects. You may report side effects to FDA at 1-800-FDA-1088. Where should I keep my medication? Keep out of the reach of children. Store in a refrigerator between 2 and 8 degrees C (36 and 46 degrees F). Do not freeze. Keep in carton to protect from light. Throw away this medicine if vials or syringes are left out of the refrigerator for more than 24 hours. Throw away any unused medicine after the expiration date. NOTE: This sheet is a summary. It may not cover all possible information. If you have questions about this medicine, talk to your doctor, pharmacist, or health care provider.  2022 Elsevier/Gold Standard (2019-06-08 18:47:55)

## 2021-02-26 ENCOUNTER — Inpatient Hospital Stay: Payer: PRIVATE HEALTH INSURANCE

## 2021-02-26 DIAGNOSIS — C7931 Secondary malignant neoplasm of brain: Secondary | ICD-10-CM | POA: Diagnosis not present

## 2021-02-26 DIAGNOSIS — C182 Malignant neoplasm of ascending colon: Secondary | ICD-10-CM

## 2021-02-26 MED ORDER — FILGRASTIM-SNDZ 480 MCG/0.8ML IJ SOSY
480.0000 ug | PREFILLED_SYRINGE | Freq: Once | INTRAMUSCULAR | Status: AC
  Administered 2021-02-26: 480 ug via SUBCUTANEOUS
  Filled 2021-02-26: qty 0.8

## 2021-02-26 NOTE — Patient Instructions (Signed)
Filgrastim, G-CSF injection What is this medication? FILGRASTIM, G-CSF (fil GRA stim) is a granulocyte colony-stimulating factor that stimulates the growth of neutrophils, a type of white blood cell (WBC) important in the body's fight against infection. It is used to reduce the incidence of fever and infection in patients with certain types of cancer who are receiving chemotherapy that affects the bone marrow, to stimulate blood cell production for removal of WBCs from the body prior to a bone marrow transplantation, to reduce the incidence of fever and infection in patients who have severe chronic neutropenia, and to improve survival outcomes following high-dose radiation exposure that is toxic to the bone marrow. This medicine may be used for other purposes; ask your health care provider or pharmacist if you have questions. COMMON BRAND NAME(S): Neupogen, Nivestym, Releuko, Zarxio What should I tell my care team before I take this medication? They need to know if you have any of these conditions: kidney disease latex allergy ongoing radiation therapy sickle cell disease an unusual or allergic reaction to filgrastim, pegfilgrastim, other medicines, foods, dyes, or preservatives pregnant or trying to get pregnant breast-feeding How should I use this medication? This medicine is for injection under the skin or infusion into a vein. As an infusion into a vein, it is usually given by a health care professional in a hospital or clinic setting. If you get this medicine at home, you will be taught how to prepare and give this medicine. Refer to the Instructions for Use that come with your medication packaging. Use exactly as directed. Take your medicine at regular intervals. Do not take your medicine more often than directed. It is important that you put your used needles and syringes in a special sharps container. Do not put them in a trash can. If you do not have a sharps container, call your pharmacist  or healthcare provider to get one. Talk to your pediatrician regarding the use of this medicine in children. While this drug may be prescribed for children as young as 7 months for selected conditions, precautions do apply. Overdosage: If you think you have taken too much of this medicine contact a poison control center or emergency room at once. NOTE: This medicine is only for you. Do not share this medicine with others. What if I miss a dose? It is important not to miss your dose. Call your doctor or health care professional if you miss a dose. What may interact with this medication? This medicine may interact with the following medications: medicines that may cause a release of neutrophils, such as lithium This list may not describe all possible interactions. Give your health care provider a list of all the medicines, herbs, non-prescription drugs, or dietary supplements you use. Also tell them if you smoke, drink alcohol, or use illegal drugs. Some items may interact with your medicine. What should I watch for while using this medication? Your condition will be monitored carefully while you are receiving this medicine. You may need blood work done while you are taking this medicine. Talk to your health care provider about your risk of cancer. You may be more at risk for certain types of cancer if you take this medicine. What side effects may I notice from receiving this medication? Side effects that you should report to your doctor or health care professional as soon as possible: allergic reactions like skin rash, itching or hives, swelling of the face, lips, or tongue back pain dizziness or feeling faint fever pain, redness, or   irritation at site where injected pinpoint red spots on the skin shortness of breath or breathing problems signs and symptoms of kidney injury like trouble passing urine, change in the amount of urine, or red or dark-brown urine stomach or side pain, or pain at  the shoulder swelling tiredness unusual bleeding or bruising Side effects that usually do not require medical attention (report to your doctor or health care professional if they continue or are bothersome): bone pain cough diarrhea hair loss headache muscle pain This list may not describe all possible side effects. Call your doctor for medical advice about side effects. You may report side effects to FDA at 1-800-FDA-1088. Where should I keep my medication? Keep out of the reach of children. Store in a refrigerator between 2 and 8 degrees C (36 and 46 degrees F). Do not freeze. Keep in carton to protect from light. Throw away this medicine if vials or syringes are left out of the refrigerator for more than 24 hours. Throw away any unused medicine after the expiration date. NOTE: This sheet is a summary. It may not cover all possible information. If you have questions about this medicine, talk to your doctor, pharmacist, or health care provider.  2022 Elsevier/Gold Standard (2019-06-08 18:47:55)  

## 2021-02-26 NOTE — Progress Notes (Signed)
Discharged home, stable  

## 2021-02-28 ENCOUNTER — Other Ambulatory Visit: Payer: Self-pay

## 2021-02-28 ENCOUNTER — Inpatient Hospital Stay (INDEPENDENT_AMBULATORY_CARE_PROVIDER_SITE_OTHER): Payer: PRIVATE HEALTH INSURANCE | Admitting: Oncology

## 2021-02-28 ENCOUNTER — Inpatient Hospital Stay: Payer: PRIVATE HEALTH INSURANCE

## 2021-02-28 ENCOUNTER — Other Ambulatory Visit: Payer: Self-pay | Admitting: Hematology and Oncology

## 2021-02-28 ENCOUNTER — Telehealth: Payer: Self-pay | Admitting: Oncology

## 2021-02-28 ENCOUNTER — Encounter: Payer: Self-pay | Admitting: Oncology

## 2021-02-28 VITALS — BP 105/71 | HR 114 | Temp 97.8°F | Resp 16 | Ht 68.0 in | Wt 187.9 lb

## 2021-02-28 DIAGNOSIS — C182 Malignant neoplasm of ascending colon: Secondary | ICD-10-CM

## 2021-02-28 DIAGNOSIS — C183 Malignant neoplasm of hepatic flexure: Secondary | ICD-10-CM

## 2021-02-28 LAB — COMPREHENSIVE METABOLIC PANEL
Albumin: 4.3 (ref 3.5–5.0)
Calcium: 9.4 (ref 8.7–10.7)

## 2021-02-28 LAB — CBC AND DIFFERENTIAL
HCT: 38 (ref 36–46)
Hemoglobin: 12.4 (ref 12.0–16.0)
Neutrophils Absolute: 3.78
Platelets: 134 — AB (ref 150–399)
WBC: 6.3

## 2021-02-28 LAB — BASIC METABOLIC PANEL
BUN: 19 (ref 4–21)
CO2: 22 (ref 13–22)
Chloride: 100 (ref 99–108)
Creatinine: 1.5 — AB (ref 0.5–1.1)
Glucose: 313
Potassium: 3.7 (ref 3.4–5.3)
Sodium: 137 (ref 137–147)

## 2021-02-28 LAB — HEPATIC FUNCTION PANEL
ALT: 27 (ref 7–35)
AST: 28 (ref 13–35)
Alkaline Phosphatase: 198 — AB (ref 25–125)
Bilirubin, Total: 0.3

## 2021-02-28 LAB — CBC: RBC: 3.8 — AB (ref 3.87–5.11)

## 2021-02-28 MED FILL — Fluorouracil IV Soln 2.5 GM/50ML (50 MG/ML): INTRAVENOUS | Qty: 17 | Status: AC

## 2021-02-28 MED FILL — Irinotecan HCl Inj 100 MG/5ML (20 MG/ML): INTRAVENOUS | Qty: 14 | Status: AC

## 2021-02-28 MED FILL — Fosaprepitant Dimeglumine For IV Infusion 150 MG (Base Eq): INTRAVENOUS | Qty: 5 | Status: AC

## 2021-02-28 MED FILL — Bevacizumab-bvzr IV Soln 400 MG/16ML (For Infusion): INTRAVENOUS | Qty: 18 | Status: AC

## 2021-02-28 MED FILL — Leucovorin Calcium For Inj 350 MG: INTRAMUSCULAR | Qty: 41.8 | Status: AC

## 2021-02-28 MED FILL — Fluorouracil IV Soln 5 GM/100ML (50 MG/ML): INTRAVENOUS | Qty: 100 | Status: AC

## 2021-02-28 MED FILL — Dexamethasone Sodium Phosphate Inj 100 MG/10ML: INTRAMUSCULAR | Qty: 1 | Status: AC

## 2021-02-28 NOTE — Telephone Encounter (Signed)
Per 9/30 LOS next appt scheduled and given to patient 

## 2021-03-03 ENCOUNTER — Other Ambulatory Visit: Payer: Self-pay

## 2021-03-03 ENCOUNTER — Ambulatory Visit: Payer: No Typology Code available for payment source

## 2021-03-03 ENCOUNTER — Encounter: Payer: Self-pay | Admitting: Oncology

## 2021-03-03 ENCOUNTER — Inpatient Hospital Stay: Payer: PRIVATE HEALTH INSURANCE | Attending: Oncology

## 2021-03-03 VITALS — BP 112/80 | HR 104 | Temp 97.5°F | Resp 18 | Ht 68.0 in | Wt 194.0 lb

## 2021-03-03 DIAGNOSIS — C7971 Secondary malignant neoplasm of right adrenal gland: Secondary | ICD-10-CM | POA: Diagnosis not present

## 2021-03-03 DIAGNOSIS — C189 Malignant neoplasm of colon, unspecified: Secondary | ICD-10-CM | POA: Insufficient documentation

## 2021-03-03 DIAGNOSIS — C7931 Secondary malignant neoplasm of brain: Secondary | ICD-10-CM | POA: Diagnosis not present

## 2021-03-03 DIAGNOSIS — C7802 Secondary malignant neoplasm of left lung: Secondary | ICD-10-CM | POA: Insufficient documentation

## 2021-03-03 DIAGNOSIS — Z5112 Encounter for antineoplastic immunotherapy: Secondary | ICD-10-CM | POA: Diagnosis present

## 2021-03-03 DIAGNOSIS — Z5111 Encounter for antineoplastic chemotherapy: Secondary | ICD-10-CM | POA: Insufficient documentation

## 2021-03-03 DIAGNOSIS — C7989 Secondary malignant neoplasm of other specified sites: Secondary | ICD-10-CM | POA: Diagnosis not present

## 2021-03-03 DIAGNOSIS — C7801 Secondary malignant neoplasm of right lung: Secondary | ICD-10-CM | POA: Diagnosis not present

## 2021-03-03 DIAGNOSIS — Z5189 Encounter for other specified aftercare: Secondary | ICD-10-CM | POA: Diagnosis not present

## 2021-03-03 DIAGNOSIS — C182 Malignant neoplasm of ascending colon: Secondary | ICD-10-CM

## 2021-03-03 MED ORDER — FLUOROURACIL CHEMO INJECTION 2.5 GM/50ML
400.0000 mg/m2 | Freq: Once | INTRAVENOUS | Status: AC
  Administered 2021-03-03: 850 mg via INTRAVENOUS
  Filled 2021-03-03: qty 17

## 2021-03-03 MED ORDER — PALONOSETRON HCL INJECTION 0.25 MG/5ML
0.2500 mg | Freq: Once | INTRAVENOUS | Status: AC
  Administered 2021-03-03: 0.25 mg via INTRAVENOUS
  Filled 2021-03-03: qty 5

## 2021-03-03 MED ORDER — SODIUM CHLORIDE 0.9 % IV SOLN: Freq: Once | INTRAVENOUS | Status: AC

## 2021-03-03 MED ORDER — SODIUM CHLORIDE 0.9 % IV SOLN
2400.0000 mg/m2 | INTRAVENOUS | Status: DC
  Administered 2021-03-03: 5000 mg via INTRAVENOUS
  Filled 2021-03-03: qty 100

## 2021-03-03 MED ORDER — FOSAPREPITANT DIMEGLUMINE INJECTION 150 MG
150.0000 mg | Freq: Once | INTRAVENOUS | Status: AC
  Administered 2021-03-03: 150 mg via INTRAVENOUS
  Filled 2021-03-03: qty 150

## 2021-03-03 MED ORDER — ATROPINE SULFATE 1 MG/ML IJ SOLN
0.5000 mg | Freq: Once | INTRAMUSCULAR | Status: AC | PRN
  Administered 2021-03-03: 0.5 mg via INTRAVENOUS
  Filled 2021-03-03: qty 1

## 2021-03-03 MED ORDER — SODIUM CHLORIDE 0.9 % IV SOLN
5.0000 mg/kg | Freq: Once | INTRAVENOUS | Status: AC
  Administered 2021-03-03: 450 mg via INTRAVENOUS
  Filled 2021-03-03: qty 16

## 2021-03-03 MED ORDER — IRINOTECAN HCL CHEMO INJECTION 100 MG/5ML
135.0000 mg/m2 | Freq: Once | INTRAVENOUS | Status: AC
  Administered 2021-03-03: 280 mg via INTRAVENOUS
  Filled 2021-03-03: qty 10

## 2021-03-03 MED ORDER — SODIUM CHLORIDE 0.9 % IV SOLN
10.0000 mg | Freq: Once | INTRAVENOUS | Status: AC
  Administered 2021-03-03: 10 mg via INTRAVENOUS
  Filled 2021-03-03: qty 10

## 2021-03-03 MED ORDER — LEUCOVORIN CALCIUM INJECTION 350 MG
400.0000 mg/m2 | Freq: Once | INTRAVENOUS | Status: AC
  Administered 2021-03-03: 836 mg via INTRAVENOUS
  Filled 2021-03-03: qty 41.8

## 2021-03-03 NOTE — Patient Instructions (Signed)
Irinotecan injection What is this medication? IRINOTECAN (ir in oh TEE kan ) is a chemotherapy drug. It is used to treat colon and rectal cancer. This medicine may be used for other purposes; ask your health care provider or pharmacist if you have questions. COMMON BRAND NAME(S): Camptosar What should I tell my care team before I take this medication? They need to know if you have any of these conditions: dehydration diarrhea infection (especially a virus infection such as chickenpox, cold sores, or herpes) liver disease low blood counts, like low white cell, platelet, or red cell counts low levels of calcium, magnesium, or potassium in the blood recent or ongoing radiation therapy an unusual or allergic reaction to irinotecan, other medicines, foods, dyes, or preservatives pregnant or trying to get pregnant breast-feeding How should I use this medication? This drug is given as an infusion into a vein. It is administered in a hospital or clinic by a specially trained health care professional. Talk to your pediatrician regarding the use of this medicine in children. Special care may be needed. Overdosage: If you think you have taken too much of this medicine contact a poison control center or emergency room at once. NOTE: This medicine is only for you. Do not share this medicine with others. What if I miss a dose? It is important not to miss your dose. Call your doctor or health care professional if you are unable to keep an appointment. What may interact with this medication? Do not take this medicine with any of the following medications: cobicistat itraconazole This medicine may interact with the following medications: antiviral medicines for HIV or AIDS certain antibiotics like rifampin or rifabutin certain medicines for fungal infections like ketoconazole, posaconazole, and voriconazole certain medicines for seizures like carbamazepine, phenobarbital,  phenotoin clarithromycin gemfibrozil nefazodone [facility]. John's Wort This list may not describe all possible interactions. Give your health care provider a list of all the medicines, herbs, non-prescription drugs, or dietary supplements you use. Also tell them if you smoke, drink alcohol, or use illegal drugs. Some items may interact with your medicine. What should I watch for while using this medication? Your condition will be monitored carefully while you are receiving this medicine. You will need important blood work done while you are taking this medicine. This drug may make you feel generally unwell. This is not uncommon, as chemotherapy can affect healthy cells as well as cancer cells. Report any side effects. Continue your course of treatment even though you feel ill unless your doctor tells you to stop. In some cases, you may be given additional medicines to help with side effects. Follow all directions for their use. You may get drowsy or dizzy. Do not drive, use machinery, or do anything that needs mental alertness until you know how this medicine affects you. Do not stand or sit up quickly, especially if you are an older patient. This reduces the risk of dizzy or fainting spells. Call your health care professional for advice if you get a fever, chills, or sore throat, or other symptoms of a cold or flu. Do not treat yourself. This medicine decreases your body's ability to fight infections. Try to avoid being around people who are sick. Avoid taking products that contain aspirin, acetaminophen, ibuprofen, naproxen, or ketoprofen unless instructed by your doctor. These medicines may hide a fever. This medicine may increase your risk to bruise or bleed. Call your doctor or health care professional if you notice any unusual bleeding. Be careful brushing and  flossing your teeth or using a toothpick because you may get an infection or bleed more easily. If you have any dental work done, tell your  dentist you are receiving this medicine. Do not become pregnant while taking this medicine or for 6 months after stopping it. Women should inform their health care professional if they wish to become pregnant or think they might be pregnant. Men should not father a child while taking this medicine and for 3 months after stopping it. There is potential for serious side effects to an unborn child. Talk to your health care professional for more information. Do not breast-feed an infant while taking this medicine or for 7 days after stopping it. This medicine has caused ovarian failure in some women. This medicine may make it more difficult to get pregnant. Talk to your health care professional if you are concerned about your fertility. This medicine has caused decreased sperm counts in some men. This may make it more difficult to father a child. Talk to your health care professional if you are concerned about your fertility. What side effects may I notice from receiving this medication? Side effects that you should report to your doctor or health care professional as soon as possible: allergic reactions like skin rash, itching or hives, swelling of the face, lips, or tongue chest pain diarrhea flushing, runny nose, sweating during infusion low blood counts - this medicine may decrease the number of white blood cells, red blood cells and platelets. You may be at increased risk for infections and bleeding. nausea, vomiting pain, swelling, warmth in the leg signs of decreased platelets or bleeding - bruising, pinpoint red spots on the skin, black, tarry stools, blood in the urine signs of infection - fever or chills, cough, sore throat, pain or difficulty passing urine signs of decreased red blood cells - unusually weak or tired, fainting spells, lightheadedness Side effects that usually do not require medical attention (report to your doctor or health care professional if they continue or are  bothersome): constipation hair loss headache loss of appetite mouth sores stomach pain This list may not describe all possible side effects. Call your doctor for medical advice about side effects. You may report side effects to FDA at 1-800-FDA-1088. Where should I keep my medication? This drug is given in a hospital or clinic and will not be stored at home. NOTE: This sheet is a summary. It may not cover all possible information. If you have questions about this medicine, talk to your doctor, pharmacist, or health care provider.  2022 [employer] (2019-04-18 17:46:13) Leucovorin injection What is this medication? LEUCOVORIN (loo koe VOR in) is used to prevent or treat the harmful effects of some medicines. This medicine is used to treat anemia caused by a low amount of folic acid in the body. It is also used with 5-fluorouracil (5-FU) to treat colon cancer. This medicine may be used for other purposes; ask your health care provider or pharmacist if you have questions. What should I tell my care team before I take this medication? They need to know if you have any of these conditions: anemia from low levels of vitamin B-12 in the blood an unusual or allergic reaction to leucovorin, folic acid, other medicines, foods, dyes, or preservatives pregnant or trying to get pregnant breast-feeding How should I use this medication? This medicine is for injection into a muscle or into a vein. It is given by a health care professional in a hospital or clinic setting.  Talk to your pediatrician regarding the use of this medicine in children. Special care may be needed. Overdosage: If you think you have taken too much of this medicine contact a poison control center or emergency room at once. NOTE: This medicine is only for you. Do not share this medicine with others. What if I miss a dose? This does not apply. What may interact with this  medication? capecitabine fluorouracil phenobarbital phenytoin primidone trimethoprim-sulfamethoxazole This list may not describe all possible interactions. Give your health care provider a list of all the medicines, herbs, non-prescription drugs, or dietary supplements you use. Also tell them if you smoke, drink alcohol, or use illegal drugs. Some items may interact with your medicine. What should I watch for while using this medication? Your condition will be monitored carefully while you are receiving this medicine. This medicine may increase the side effects of 5-fluorouracil, 5-FU. Tell your doctor or health care professional if you have diarrhea or mouth sores that do not get better or that get worse. What side effects may I notice from receiving this medication? Side effects that you should report to your doctor or health care professional as soon as possible: allergic reactions like skin rash, itching or hives, swelling of the face, lips, or tongue breathing problems fever, infection mouth sores unusual bleeding or bruising unusually weak or tired Side effects that usually do not require medical attention (report to your doctor or health care professional if they continue or are bothersome): constipation or diarrhea loss of appetite nausea, vomiting This list may not describe all possible side effects. Call your doctor for medical advice about side effects. You may report side effects to FDA at 1-800-FDA-1088. Where should I keep my medication? This drug is given in a hospital or clinic and will not be stored at home. NOTE: This sheet is a summary. It may not cover all possible information. If you have questions about this medicine, talk to your doctor, pharmacist, or health care provider.  2022 [employer] Standard (2007-11-22 16:50:29) Fluorouracil, 5-FU injection What is this medication? FLUOROURACIL, 5-FU (flure oh YOOR a sil) is a chemotherapy drug. It slows the growth of  cancer cells. This medicine is used to treat many types of cancer like breast cancer, colon or rectal cancer, pancreatic cancer, and stomach cancer. This medicine may be used for other purposes; ask your health care provider or pharmacist if you have questions. COMMON BRAND NAME(S): Adrucil What should I tell my care team before I take this medication? They need to know if you have any of these conditions: blood disorders dihydropyrimidine dehydrogenase (DPD) deficiency infection (especially a virus infection such as chickenpox, cold sores, or herpes) kidney disease liver disease malnourished, poor nutrition recent or ongoing radiation therapy an unusual or allergic reaction to fluorouracil, other chemotherapy, other medicines, foods, dyes, or preservatives pregnant or trying to get pregnant breast-feeding How should I use this medication? This drug is given as an infusion or injection into a vein. It is administered in a hospital or clinic by a specially trained health care professional. Talk to your pediatrician regarding the use of this medicine in children. Special care may be needed. Overdosage: If you think you have taken too much of this medicine contact a poison control center or emergency room at once. NOTE: This medicine is only for you. Do not share this medicine with others. What if I miss a dose? It is important not to miss your dose. Call your doctor or health care professional  if you are unable to keep an appointment. What may interact with this medication? Do not take this medicine with any of the following medications: live virus vaccines This medicine may also interact with the following medications: medicines that treat or prevent blood clots like warfarin, enoxaparin, and dalteparin This list may not describe all possible interactions. Give your health care provider a list of all the medicines, herbs, non-prescription drugs, or dietary supplements you use. Also tell  them if you smoke, drink alcohol, or use illegal drugs. Some items may interact with your medicine. What should I watch for while using this medication? Visit your doctor for checks on your progress. This drug may make you feel generally unwell. This is not uncommon, as chemotherapy can affect healthy cells as well as cancer cells. Report any side effects. Continue your course of treatment even though you feel ill unless your doctor tells you to stop. In some cases, you may be given additional medicines to help with side effects. Follow all directions for their use. Call your doctor or health care professional for advice if you get a fever, chills or sore throat, or other symptoms of a cold or flu. Do not treat yourself. This drug decreases your body's ability to fight infections. Try to avoid being around people who are sick. This medicine may increase your risk to bruise or bleed. Call your doctor or health care professional if you notice any unusual bleeding. Be careful brushing and flossing your teeth or using a toothpick because you may get an infection or bleed more easily. If you have any dental work done, tell your dentist you are receiving this medicine. Avoid taking products that contain aspirin, acetaminophen, ibuprofen, naproxen, or ketoprofen unless instructed by your doctor. These medicines may hide a fever. Do not become pregnant while taking this medicine. Women should inform their doctor if they wish to become pregnant or think they might be pregnant. There is a potential for serious side effects to an unborn child. Talk to your health care professional or pharmacist for more information. Do not breast-feed an infant while taking this medicine. Men should inform their doctor if they wish to father a child. This medicine may lower sperm counts. Do not treat diarrhea with over the counter products. Contact your doctor if you have diarrhea that lasts more than 2 days or if it is severe and  watery. This medicine can make you more sensitive to the sun. Keep out of the sun. If you cannot avoid being in the sun, wear protective clothing and use sunscreen. Do not use sun lamps or tanning beds/booths. What side effects may I notice from receiving this medication? Side effects that you should report to your doctor or health care professional as soon as possible: allergic reactions like skin rash, itching or hives, swelling of the face, lips, or tongue low blood counts - this medicine may decrease the number of white blood cells, red blood cells and platelets. You may be at increased risk for infections and bleeding. signs of infection - fever or chills, cough, sore throat, pain or difficulty passing urine signs of decreased platelets or bleeding - bruising, pinpoint red spots on the skin, black, tarry stools, blood in the urine signs of decreased red blood cells - unusually weak or tired, fainting spells, lightheadedness breathing problems changes in vision chest pain mouth sores nausea and vomiting pain, swelling, redness at site where injected pain, tingling, numbness in the hands or feet redness, swelling, or sores  on hands or feet stomach pain unusual bleeding Side effects that usually do not require medical attention (report to your doctor or health care professional if they continue or are bothersome): changes in finger or toe nails diarrhea dry or itchy skin hair loss headache loss of appetite sensitivity of eyes to the light stomach upset unusually teary eyes This list may not describe all possible side effects. Call your doctor for medical advice about side effects. You may report side effects to FDA at 1-800-FDA-1088. Where should I keep my medication? This drug is given in a hospital or clinic and will not be stored at home. NOTE: This sheet is a summary. It may not cover all possible information. If you have questions about this medicine, talk to your doctor,  pharmacist, or health care provider.  2022 [employer] (2019-04-18 15:00:03) Bevacizumab injection What is this medication? BEVACIZUMAB (be va SIZ yoo mab) is a monoclonal antibody. It is used to treat many types of cancer. This medicine may be used for other purposes; ask your health care provider or pharmacist if you have questions. COMMON BRAND NAME(S): Avastin, MVASI, Noah Charon What should I tell my care team before I take this medication? They need to know if you have any of these conditions: diabetes heart disease high blood pressure history of coughing up blood prior anthracycline chemotherapy (e.g., doxorubicin, daunorubicin, epirubicin) recent or ongoing radiation therapy recent or planning to have surgery stroke an unusual or allergic reaction to bevacizumab, hamster proteins, mouse proteins, other medicines, foods, dyes, or preservatives pregnant or trying to get pregnant breast-feeding How should I use this medication? This medicine is for infusion into a vein. It is given by a health care professional in a hospital or clinic setting. Talk to your pediatrician regarding the use of this medicine in children. Special care may be needed. Overdosage: If you think you have taken too much of this medicine contact a poison control center or emergency room at once. NOTE: This medicine is only for you. Do not share this medicine with others. What if I miss a dose? It is important not to miss your dose. Call your doctor or health care professional if you are unable to keep an appointment. What may interact with this medication? Interactions are not expected. This list may not describe all possible interactions. Give your health care provider a list of all the medicines, herbs, non-prescription drugs, or dietary supplements you use. Also tell them if you smoke, drink alcohol, or use illegal drugs. Some items may interact with your medicine. What should I watch for while using  this medication? Your condition will be monitored carefully while you are receiving this medicine. You will need important blood work and urine testing done while you are taking this medicine. This medicine may increase your risk to bruise or bleed. Call your doctor or health care professional if you notice any unusual bleeding. Before having surgery, talk to your health care provider to make sure it is ok. This drug can increase the risk of poor healing of your surgical site or wound. You will need to stop this drug for 28 days before surgery. After surgery, wait at least 28 days before restarting this drug. Make sure the surgical site or wound is healed enough before restarting this drug. Talk to your health care provider if questions. Do not become pregnant while taking this medicine or for 6 months after stopping it. Women should inform their doctor if they wish to become pregnant or  think they might be pregnant. There is a potential for serious side effects to an unborn child. Talk to your health care professional or pharmacist for more information. Do not breast-feed an infant while taking this medicine and for 6 months after the last dose. This medicine has caused ovarian failure in some women. This medicine may interfere with the ability to have a child. You should talk to your doctor or health care professional if you are concerned about your fertility. What side effects may I notice from receiving this medication? Side effects that you should report to your doctor or health care professional as soon as possible: allergic reactions like skin rash, itching or hives, swelling of the face, lips, or tongue chest pain or chest tightness chills coughing up blood high fever seizures severe constipation signs and symptoms of bleeding such as bloody or black, tarry stools; red or dark-brown urine; spitting up blood or brown material that looks like coffee grounds; red spots on the skin; unusual  bruising or bleeding from the eye, gums, or nose signs and symptoms of a blood clot such as breathing problems; chest pain; severe, sudden headache; pain, swelling, warmth in the leg signs and symptoms of a stroke like changes in vision; confusion; trouble speaking or understanding; severe headaches; sudden numbness or weakness of the face, arm or leg; trouble walking; dizziness; loss of balance or coordination stomach pain sweating swelling of legs or ankles vomiting weight gain Side effects that usually do not require medical attention (report to your doctor or health care professional if they continue or are bothersome): back pain changes in taste decreased appetite dry skin nausea tiredness This list may not describe all possible side effects. Call your doctor for medical advice about side effects. You may report side effects to FDA at 1-800-FDA-1088. Where should I keep my medication? This drug is given in a hospital or clinic and will not be stored at home. NOTE: This sheet is a summary. It may not cover all possible information. If you have questions about this medicine, talk to your doctor, pharmacist, or health care provider.  2022 [employer] (2019-03-15 10:50:46)

## 2021-03-04 ENCOUNTER — Encounter: Payer: Self-pay | Admitting: Oncology

## 2021-03-05 ENCOUNTER — Inpatient Hospital Stay: Payer: PRIVATE HEALTH INSURANCE

## 2021-03-05 ENCOUNTER — Other Ambulatory Visit: Payer: Self-pay

## 2021-03-05 VITALS — BP 94/75 | HR 109 | Temp 98.1°F | Resp 16 | Ht 68.0 in | Wt 186.1 lb

## 2021-03-05 DIAGNOSIS — Z5111 Encounter for antineoplastic chemotherapy: Secondary | ICD-10-CM | POA: Diagnosis not present

## 2021-03-05 DIAGNOSIS — C182 Malignant neoplasm of ascending colon: Secondary | ICD-10-CM

## 2021-03-05 MED ORDER — HEPARIN SOD (PORK) LOCK FLUSH 100 UNIT/ML IV SOLN
500.0000 [IU] | Freq: Once | INTRAVENOUS | Status: AC | PRN
  Administered 2021-03-05: 500 [IU]

## 2021-03-05 MED ORDER — SODIUM CHLORIDE 0.9% FLUSH: 10.0000 mL | INTRAVENOUS | Status: DC | PRN

## 2021-03-05 MED ORDER — SODIUM CHLORIDE 0.9 % IV SOLN
Freq: Once | INTRAVENOUS | Status: AC
Start: 2021-03-05 — End: 2021-03-05

## 2021-03-05 NOTE — Progress Notes (Signed)
Discharged home, stable  

## 2021-03-05 NOTE — Patient Instructions (Signed)

## 2021-03-07 ENCOUNTER — Encounter: Payer: Self-pay | Admitting: Oncology

## 2021-03-11 ENCOUNTER — Encounter: Payer: No Typology Code available for payment source | Attending: Psychology | Admitting: Psychology

## 2021-03-11 DIAGNOSIS — G9389 Other specified disorders of brain: Secondary | ICD-10-CM | POA: Insufficient documentation

## 2021-03-11 DIAGNOSIS — F5101 Primary insomnia: Secondary | ICD-10-CM | POA: Insufficient documentation

## 2021-03-11 DIAGNOSIS — C7931 Secondary malignant neoplasm of brain: Secondary | ICD-10-CM | POA: Insufficient documentation

## 2021-03-11 DIAGNOSIS — F331 Major depressive disorder, recurrent, moderate: Secondary | ICD-10-CM | POA: Insufficient documentation

## 2021-03-11 DIAGNOSIS — G441 Vascular headache, not elsewhere classified: Secondary | ICD-10-CM | POA: Insufficient documentation

## 2021-03-11 NOTE — Progress Notes (Signed)
[facility]  [address] [place],  [place]  [address] [phone]  Clinic Day:  03/17/2021  Referring physician: Greig Right, MD  This document serves as a record of services personally performed by Genisis Sonnier Macarthur Critchley, MD. It was created on their behalf by [facility], a trained medical scribe. The creation of this record is based on the scribe's personal observations and the provider's statements to them.  HISTORY OF PRESENT ILLNESS:  The patient is a 55 y.o. female with metastatic colon cancer, which includes spread of disease to her abdominal cavity, lungs, brain and right adrenal gland. She comes in today prior to her 17th cycle of FOLFIRI/Avastin.  The patient claims to have toleraed her 16th cycle of treatment fairly well.  Her diarrhea remains prominent despite receiving atropine to offset the effects of irinotecan, whose dose has been decreased.  She has begun to notice mild tingling in her fingertips, but her fine motor movements remain ideal.  She denies having any new symptoms/findings which concern her for disease progression.   With respect to her colon cancer history, she is status post a right hemicolectomy in early October 2018, followed by 12 cycles of adjuvant chemotherapy, which were completed in April 2019.  In December 2021, she underwent a left cerebellar metastatectomy, whose pathology was consistent with metastatic colon cancer.  Tumor testing did come back MMR normal.  CT scans recently showed evidence of her cancer being in multiple locations, which has her on palliative chemotherapy at this time.    PHYSICAL EXAM:  Blood pressure 113/73, pulse (!) 107, temperature 98.1 F (36.7 C), resp. rate 16, height 5' 8"  (1.727 m), weight 190 lb 3.2 oz (86.3 kg), last menstrual period 02/22/2019, SpO2 97 %. Wt Readings from Last 3 Encounters:  03/17/21 190 lb 3.2 oz (86.3 kg)  03/05/21 186 lb 1.3 oz (84.4 kg)  03/03/21 194 lb (88 kg)    Body mass index is 28.92 kg/m. Performance status (ECOG): 1 Physical Exam Constitutional:      General: She is not in acute distress.    Appearance: Normal appearance. She is normal weight. She is not ill-appearing.  HENT:     Head: Normocephalic and atraumatic.  Eyes:     General: No scleral icterus.    Extraocular Movements: Extraocular movements intact.     Conjunctiva/sclera: Conjunctivae normal.     Pupils: Pupils are equal, round, and reactive to light.  Cardiovascular:     Rate and Rhythm: Regular rhythm. Tachycardia present.     Pulses: Normal pulses.     Heart sounds: Normal heart sounds. No murmur heard.   No friction rub. No gallop.  Pulmonary:     Effort: Pulmonary effort is normal. No respiratory distress.     Breath sounds: Normal breath sounds.  Abdominal:     General: Bowel sounds are normal. There is no distension.     Palpations: Abdomen is soft. There is no hepatomegaly, splenomegaly or mass.     Tenderness: There is no abdominal tenderness.  Musculoskeletal:        General: Normal range of motion.     Cervical back: Normal range of motion and neck supple.     Right lower leg: No edema.     Left lower leg: No edema.  Lymphadenopathy:     Cervical: No cervical adenopathy.  Skin:    General: Skin is warm and dry.  Neurological:     General: No focal deficit present.  Mental Status: She is alert and oriented to person, place, and time. Mental status is at baseline.  Psychiatric:        Mood and Affect: Mood normal.        Behavior: Behavior normal.        Thought Content: Thought content normal.        Judgment: Judgment normal.    LABS:    Ref. Range 03/17/2021 00:00  Sodium Latest Ref Range: 137 - 147  142  Potassium Latest Ref Range: 3.4 - 5.3  3.4  Chloride Latest Ref Range: 99 - 108  112 (A)  CO2 Latest Ref Range: 13 - 22  18  Glucose Unknown 189  BUN Latest Ref Range: 4 - 21  10  Creatinine Latest Ref Range: 0.5 - 1.1  1.2 (A)   Calcium Latest Ref Range: 8.7 - 10.7  8.5 (A)  Alkaline Phosphatase Latest Ref Range: 25 - 125  143 (A)  Albumin Latest Ref Range: 3.5 - 5.0  3.9  AST Latest Ref Range: 13 - 35  29  ALT Latest Ref Range: 7 - 35  26  Bilirubin, Total Unknown 0.3  WBC Unknown 3.2  RBC Latest Ref Range: 3.87 - 5.11  3.52 (A)  Hemoglobin Latest Ref Range: 12.0 - 16.0  11.7 (A)  HCT Latest Ref Range: 36 - 46  35 (A)  MCV Latest Ref Range: 81 - 99  101 (A)  Platelets Latest Ref Range: 150 - 399  144 (A)  NEUT# Unknown 1.41     ASSESSMENT & PLAN:  Assessment/Plan:  A 54 y.o. female with metastatic colon cancer.  She will proceed with her 17th cycle of FOLFIRI/Avastin this week. Overall, she clinically looks great.  I will see her back in 2 weeks before she heads into her 18th cycle of  FOLFIRI/Avastin.  The patient understands all the plans discussed today and is in agreement with them.     I, Rita Ohara, am acting as scribe for Marice Potter, MD    I have reviewed this report as typed by the medical scribe, and it is complete and accurate.  Wiatt Mahabir Macarthur Critchley, MD

## 2021-03-13 ENCOUNTER — Telehealth: Payer: Self-pay | Admitting: *Deleted

## 2021-03-13 NOTE — Telephone Encounter (Signed)
Attempted to reach patient to advise they need to schedule MRI.  Left message, pending call back.

## 2021-03-16 ENCOUNTER — Other Ambulatory Visit: Payer: Self-pay | Admitting: Oncology

## 2021-03-17 ENCOUNTER — Telehealth: Payer: Self-pay | Admitting: Oncology

## 2021-03-17 ENCOUNTER — Inpatient Hospital Stay (INDEPENDENT_AMBULATORY_CARE_PROVIDER_SITE_OTHER): Payer: PRIVATE HEALTH INSURANCE | Admitting: Oncology

## 2021-03-17 ENCOUNTER — Other Ambulatory Visit: Payer: Self-pay

## 2021-03-17 ENCOUNTER — Other Ambulatory Visit: Payer: Self-pay | Admitting: Hematology and Oncology

## 2021-03-17 ENCOUNTER — Other Ambulatory Visit: Payer: Self-pay | Admitting: Psychiatry

## 2021-03-17 ENCOUNTER — Telehealth: Payer: Self-pay | Admitting: Psychiatry

## 2021-03-17 ENCOUNTER — Inpatient Hospital Stay: Payer: PRIVATE HEALTH INSURANCE

## 2021-03-17 ENCOUNTER — Encounter: Payer: Self-pay | Admitting: Oncology

## 2021-03-17 VITALS — BP 113/73 | HR 107 | Temp 98.1°F | Resp 16 | Ht 68.0 in | Wt 190.2 lb

## 2021-03-17 DIAGNOSIS — F422 Mixed obsessional thoughts and acts: Secondary | ICD-10-CM

## 2021-03-17 DIAGNOSIS — C183 Malignant neoplasm of hepatic flexure: Secondary | ICD-10-CM | POA: Diagnosis not present

## 2021-03-17 DIAGNOSIS — C182 Malignant neoplasm of ascending colon: Secondary | ICD-10-CM

## 2021-03-17 DIAGNOSIS — Z5111 Encounter for antineoplastic chemotherapy: Secondary | ICD-10-CM | POA: Diagnosis not present

## 2021-03-17 LAB — HEPATIC FUNCTION PANEL
ALT: 26 (ref 7–35)
AST: 29 (ref 13–35)
Alkaline Phosphatase: 143 — AB (ref 25–125)
Bilirubin, Total: 0.3

## 2021-03-17 LAB — CBC
MCV: 101 — AB (ref 81–99)
RBC: 3.52 — AB (ref 3.87–5.11)

## 2021-03-17 LAB — BASIC METABOLIC PANEL
BUN: 10 (ref 4–21)
CO2: 18 (ref 13–22)
Chloride: 112 — AB (ref 99–108)
Creatinine: 1.2 — AB (ref 0.5–1.1)
Glucose: 189
Potassium: 3.4 (ref 3.4–5.3)
Sodium: 142 (ref 137–147)

## 2021-03-17 LAB — CBC AND DIFFERENTIAL
HCT: 35 — AB (ref 36–46)
Hemoglobin: 11.7 — AB (ref 12.0–16.0)
Neutrophils Absolute: 1.41
Platelets: 144 — AB (ref 150–399)
WBC: 3.2

## 2021-03-17 LAB — COMPREHENSIVE METABOLIC PANEL
Albumin: 3.9 (ref 3.5–5.0)
Calcium: 8.5 — AB (ref 8.7–10.7)

## 2021-03-17 LAB — TOTAL PROTEIN, URINE DIPSTICK: Protein, ur: 100 mg/dL — AB

## 2021-03-17 MED ORDER — FLUVOXAMINE MALEATE 100 MG PO TABS: 300.0000 mg | ORAL_TABLET | Freq: Every day | ORAL | 0 refills | Status: DC

## 2021-03-17 MED FILL — Leucovorin Calcium For Inj 350 MG: INTRAMUSCULAR | Qty: 41.8 | Status: AC

## 2021-03-17 MED FILL — Bevacizumab-bvzr IV Soln 400 MG/16ML (For Infusion): INTRAVENOUS | Qty: 18 | Status: AC

## 2021-03-17 MED FILL — Irinotecan HCl Inj 100 MG/5ML (20 MG/ML): INTRAVENOUS | Qty: 14 | Status: AC

## 2021-03-17 MED FILL — Fluorouracil IV Soln 2.5 GM/50ML (50 MG/ML): INTRAVENOUS | Qty: 17 | Status: AC

## 2021-03-17 MED FILL — Fosaprepitant Dimeglumine For IV Infusion 150 MG (Base Eq): INTRAVENOUS | Qty: 5 | Status: AC

## 2021-03-17 MED FILL — Dexamethasone Sodium Phosphate Inj 100 MG/10ML: INTRAMUSCULAR | Qty: 1 | Status: AC

## 2021-03-17 MED FILL — Fluorouracil IV Soln 5 GM/100ML (50 MG/ML): INTRAVENOUS | Qty: 100 | Status: AC

## 2021-03-17 NOTE — Telephone Encounter (Signed)
Pt called and needs a refill on her luvox and all of her meds need refills to the [facility] on [address] [employer]

## 2021-03-17 NOTE — Telephone Encounter (Signed)
Rx sent 

## 2021-03-17 NOTE — Telephone Encounter (Signed)
Per 10/17 los next appt scheduled and given to patient

## 2021-03-18 ENCOUNTER — Inpatient Hospital Stay: Payer: PRIVATE HEALTH INSURANCE

## 2021-03-18 ENCOUNTER — Other Ambulatory Visit: Payer: Self-pay

## 2021-03-18 ENCOUNTER — Encounter: Payer: Self-pay | Admitting: Oncology

## 2021-03-18 VITALS — BP 113/74 | HR 112 | Temp 98.0°F | Resp 18 | Ht 68.0 in | Wt 192.1 lb

## 2021-03-18 DIAGNOSIS — C182 Malignant neoplasm of ascending colon: Secondary | ICD-10-CM

## 2021-03-18 DIAGNOSIS — Z5111 Encounter for antineoplastic chemotherapy: Secondary | ICD-10-CM | POA: Diagnosis not present

## 2021-03-18 MED ORDER — FLUOROURACIL CHEMO INJECTION 2.5 GM/50ML
400.0000 mg/m2 | Freq: Once | INTRAVENOUS | Status: AC
  Administered 2021-03-18: 850 mg via INTRAVENOUS
  Filled 2021-03-18: qty 17

## 2021-03-18 MED ORDER — IRINOTECAN HCL CHEMO INJECTION 100 MG/5ML
135.0000 mg/m2 | Freq: Once | INTRAVENOUS | Status: AC
  Administered 2021-03-18: 280 mg via INTRAVENOUS
  Filled 2021-03-18: qty 10

## 2021-03-18 MED ORDER — SODIUM CHLORIDE 0.9 % IV SOLN: Freq: Once | INTRAVENOUS | Status: AC

## 2021-03-18 MED ORDER — PALONOSETRON HCL INJECTION 0.25 MG/5ML
0.2500 mg | Freq: Once | INTRAVENOUS | Status: AC
  Administered 2021-03-18: 0.25 mg via INTRAVENOUS
  Filled 2021-03-18: qty 5

## 2021-03-18 MED ORDER — SODIUM CHLORIDE 0.9 % IV SOLN
5.0000 mg/kg | Freq: Once | INTRAVENOUS | Status: AC
  Administered 2021-03-18: 450 mg via INTRAVENOUS
  Filled 2021-03-18: qty 16

## 2021-03-18 MED ORDER — LEUCOVORIN CALCIUM INJECTION 350 MG
400.0000 mg/m2 | Freq: Once | INTRAVENOUS | Status: AC
  Administered 2021-03-18: 836 mg via INTRAVENOUS
  Filled 2021-03-18: qty 41.8

## 2021-03-18 MED ORDER — SODIUM CHLORIDE 0.9 % IV SOLN
2400.0000 mg/m2 | INTRAVENOUS | Status: DC
  Administered 2021-03-18: 5000 mg via INTRAVENOUS
  Filled 2021-03-18: qty 100

## 2021-03-18 MED ORDER — ATROPINE SULFATE 1 MG/ML IV SOLN
0.5000 mg | Freq: Once | INTRAVENOUS | Status: AC | PRN
  Administered 2021-03-18: 0.5 mg via INTRAVENOUS
  Filled 2021-03-18: qty 1

## 2021-03-18 MED ORDER — SODIUM CHLORIDE 0.9 % IV SOLN
10.0000 mg | Freq: Once | INTRAVENOUS | Status: AC
  Administered 2021-03-18: 10 mg via INTRAVENOUS
  Filled 2021-03-18: qty 10

## 2021-03-18 MED ORDER — SODIUM CHLORIDE 0.9 % IV SOLN
150.0000 mg | Freq: Once | INTRAVENOUS | Status: AC
  Administered 2021-03-18: 150 mg via INTRAVENOUS
  Filled 2021-03-18: qty 5

## 2021-03-18 NOTE — Patient Instructions (Signed)
[facility]  Discharge Instructions: Thank you for choosing [facility] to provide your oncology and hematology care.  If you have a lab appointment with the [facility], please go directly to the [facility] and check in at the registration area.   Wear comfortable clothing and clothing appropriate for easy access to any Portacath or PICC line.   We strive to give you quality time with your provider. You may need to reschedule your appointment if you arrive late (15 or more minutes).  Arriving late affects you and other patients whose appointments are after yours.  Also, if you miss three or more appointments without notifying the office, you may be dismissed from the clinic at the provider's discretion.      For prescription refill requests, have your pharmacy contact our office and allow 72 hours for refills to be completed.    Today you received the following chemotherapy and/or immunotherapy agents folfiri/bev   To help prevent nausea and vomiting after your treatment, we encourage you to take your nausea medication as directed.  BELOW ARE SYMPTOMS THAT SHOULD BE REPORTED IMMEDIATELY: *FEVER GREATER THAN 100.4 F (38 C) OR HIGHER *CHILLS OR SWEATING *NAUSEA AND VOMITING THAT IS NOT CONTROLLED WITH YOUR NAUSEA MEDICATION *UNUSUAL SHORTNESS OF BREATH *UNUSUAL BRUISING OR BLEEDING *URINARY PROBLEMS (pain or burning when urinating, or frequent urination) *BOWEL PROBLEMS (unusual diarrhea, constipation, pain near the anus) TENDERNESS IN MOUTH AND THROAT WITH OR WITHOUT PRESENCE OF ULCERS (sore throat, sores in mouth, or a toothache) UNUSUAL RASH, SWELLING OR PAIN  UNUSUAL VAGINAL DISCHARGE OR ITCHING   Items with * indicate a potential emergency and should be followed up as soon as possible or go to the Emergency Department if any problems should occur.  Please show the CHEMOTHERAPY ALERT CARD or IMMUNOTHERAPY ALERT CARD at check-in to the  Emergency Department and triage nurse.  Should you have questions after your visit or need to cancel or reschedule your appointment, please contact [facility]  Dept: [phone]  and follow the prompts.  Office hours are 8:00 a.m. to 4:30 p.m. Monday - Friday. Please note that voicemails left after 4:00 p.m. may not be returned until the following business day.  We are closed weekends and major holidays. You have access to a nurse at all times for urgent questions. Please call the main number to the clinic Dept: [phone] and follow the prompts.  For any non-urgent questions, you may also contact your provider using MyChart. We now offer e-Visits for anyone 96 and older to request care online for non-urgent symptoms. For details visit [email or web address].   Also download the MyChart app! Go to the app store, search "MyChart", open the app, select [facility], and log in with your MyChart username and password.  Due to Covid, a mask is required upon entering the hospital/clinic. If you do not have a mask, one will be given to you upon arrival. For doctor visits, patients may have 1 support person aged 29 or older with them. For treatment visits, patients cannot have anyone with them due to current Covid guidelines and our immunocompromised population.   Leucovorin injection What is this medication? LEUCOVORIN (loo koe VOR in) is used to prevent or treat the harmful effects of some medicines. This medicine is used to treat anemia caused by a low amount of folic acid in the body. It is also used with 5-fluorouracil (5-FU) to treat colon cancer. This medicine  may be used for other purposes; ask your health care provider or pharmacist if you have questions. What should I tell my care team before I take this medication? They need to know if you have any of these conditions: anemia from low levels of vitamin B-12 in the blood an unusual or allergic reaction to leucovorin,  folic acid, other medicines, foods, dyes, or preservatives pregnant or trying to get pregnant breast-feeding How should I use this medication? This medicine is for injection into a muscle or into a vein. It is given by a health care professional in a hospital or clinic setting. Talk to your pediatrician regarding the use of this medicine in children. Special care may be needed. Overdosage: If you think you have taken too much of this medicine contact a poison control center or emergency room at once. NOTE: This medicine is only for you. Do not share this medicine with others. What if I miss a dose? This does not apply. What may interact with this medication? capecitabine fluorouracil phenobarbital phenytoin primidone trimethoprim-sulfamethoxazole This list may not describe all possible interactions. Give your health care provider a list of all the medicines, herbs, non-prescription drugs, or dietary supplements you use. Also tell them if you smoke, drink alcohol, or use illegal drugs. Some items may interact with your medicine. What should I watch for while using this medication? Your condition will be monitored carefully while you are receiving this medicine. This medicine may increase the side effects of 5-fluorouracil, 5-FU. Tell your doctor or health care professional if you have diarrhea or mouth sores that do not get better or that get worse. What side effects may I notice from receiving this medication? Side effects that you should report to your doctor or health care professional as soon as possible: allergic reactions like skin rash, itching or hives, swelling of the face, lips, or tongue breathing problems fever, infection mouth sores unusual bleeding or bruising unusually weak or tired Side effects that usually do not require medical attention (report to your doctor or health care professional if they continue or are bothersome): constipation or diarrhea loss of  appetite nausea, vomiting This list may not describe all possible side effects. Call your doctor for medical advice about side effects. You may report side effects to FDA at 1-800-FDA-1088. Where should I keep my medication? This drug is given in a hospital or clinic and will not be stored at home. NOTE: This sheet is a summary. It may not cover all possible information. If you have questions about this medicine, talk to your doctor, pharmacist, or health care provider.  2022 Elsevier/Gold Standard (2007-11-22 16:50:29) Irinotecan injection What is this medication? IRINOTECAN (ir in oh TEE kan ) is a chemotherapy drug. It is used to treat colon and rectal cancer. This medicine may be used for other purposes; ask your health care provider or pharmacist if you have questions. COMMON BRAND NAME(S): Camptosar What should I tell my care team before I take this medication? They need to know if you have any of these conditions: dehydration diarrhea infection (especially a virus infection such as chickenpox, cold sores, or herpes) liver disease low blood counts, like low white cell, platelet, or red cell counts low levels of calcium, magnesium, or potassium in the blood recent or ongoing radiation therapy an unusual or allergic reaction to irinotecan, other medicines, foods, dyes, or preservatives pregnant or trying to get pregnant breast-feeding How should I use this medication? This drug is given as an infusion  into a vein. It is administered in a hospital or clinic by a specially trained health care professional. Talk to your pediatrician regarding the use of this medicine in children. Special care may be needed. Overdosage: If you think you have taken too much of this medicine contact a poison control center or emergency room at once. NOTE: This medicine is only for you. Do not share this medicine with others. What if I miss a dose? It is important not to miss your dose. Call your doctor  or health care professional if you are unable to keep an appointment. What may interact with this medication? Do not take this medicine with any of the following medications: cobicistat itraconazole This medicine may interact with the following medications: antiviral medicines for HIV or AIDS certain antibiotics like rifampin or rifabutin certain medicines for fungal infections like ketoconazole, posaconazole, and voriconazole certain medicines for seizures like carbamazepine, phenobarbital, phenotoin clarithromycin gemfibrozil nefazodone St. John's Wort This list may not describe all possible interactions. Give your health care provider a list of all the medicines, herbs, non-prescription drugs, or dietary supplements you use. Also tell them if you smoke, drink alcohol, or use illegal drugs. Some items may interact with your medicine. What should I watch for while using this medication? Your condition will be monitored carefully while you are receiving this medicine. You will need important blood work done while you are taking this medicine. This drug may make you feel generally unwell. This is not uncommon, as chemotherapy can affect healthy cells as well as cancer cells. Report any side effects. Continue your course of treatment even though you feel ill unless your doctor tells you to stop. In some cases, you may be given additional medicines to help with side effects. Follow all directions for their use. You may get drowsy or dizzy. Do not drive, use machinery, or do anything that needs mental alertness until you know how this medicine affects you. Do not stand or sit up quickly, especially if you are an older patient. This reduces the risk of dizzy or fainting spells. Call your health care professional for advice if you get a fever, chills, or sore throat, or other symptoms of a cold or flu. Do not treat yourself. This medicine decreases your body's ability to fight infections. Try to avoid  being around people who are sick. Avoid taking products that contain aspirin, acetaminophen, ibuprofen, naproxen, or ketoprofen unless instructed by your doctor. These medicines may hide a fever. This medicine may increase your risk to bruise or bleed. Call your doctor or health care professional if you notice any unusual bleeding. Be careful brushing and flossing your teeth or using a toothpick because you may get an infection or bleed more easily. If you have any dental work done, tell your dentist you are receiving this medicine. Do not become pregnant while taking this medicine or for 6 months after stopping it. Women should inform their health care professional if they wish to become pregnant or think they might be pregnant. Men should not father a child while taking this medicine and for 3 months after stopping it. There is potential for serious side effects to an unborn child. Talk to your health care professional for more information. Do not breast-feed an infant while taking this medicine or for 7 days after stopping it. This medicine has caused ovarian failure in some women. This medicine may make it more difficult to get pregnant. Talk to your health care professional if you are  concerned about your fertility. This medicine has caused decreased sperm counts in some men. This may make it more difficult to father a child. Talk to your health care professional if you are concerned about your fertility. What side effects may I notice from receiving this medication? Side effects that you should report to your doctor or health care professional as soon as possible: allergic reactions like skin rash, itching or hives, swelling of the face, lips, or tongue chest pain diarrhea flushing, runny nose, sweating during infusion low blood counts - this medicine may decrease the number of white blood cells, red blood cells and platelets. You may be at increased risk for infections and bleeding. nausea,  vomiting pain, swelling, warmth in the leg signs of decreased platelets or bleeding - bruising, pinpoint red spots on the skin, black, tarry stools, blood in the urine signs of infection - fever or chills, cough, sore throat, pain or difficulty passing urine signs of decreased red blood cells - unusually weak or tired, fainting spells, lightheadedness Side effects that usually do not require medical attention (report to your doctor or health care professional if they continue or are bothersome): constipation hair loss headache loss of appetite mouth sores stomach pain This list may not describe all possible side effects. Call your doctor for medical advice about side effects. You may report side effects to FDA at 1-800-FDA-1088. Where should I keep my medication? This drug is given in a hospital or clinic and will not be stored at home. NOTE: This sheet is a summary. It may not cover all possible information. If you have questions about this medicine, talk to your doctor, pharmacist, or health care provider.  2022 Elsevier/Gold Standard (2019-04-18 17:46:13) Fluorouracil, 5-FU injection What is this medication? FLUOROURACIL, 5-FU (flure oh YOOR a sil) is a chemotherapy drug. It slows the growth of cancer cells. This medicine is used to treat many types of cancer like breast cancer, colon or rectal cancer, pancreatic cancer, and stomach cancer. This medicine may be used for other purposes; ask your health care provider or pharmacist if you have questions. COMMON BRAND NAME(S): Adrucil What should I tell my care team before I take this medication? They need to know if you have any of these conditions: blood disorders dihydropyrimidine dehydrogenase (DPD) deficiency infection (especially a virus infection such as chickenpox, cold sores, or herpes) kidney disease liver disease malnourished, poor nutrition recent or ongoing radiation therapy an unusual or allergic reaction to fluorouracil,  other chemotherapy, other medicines, foods, dyes, or preservatives pregnant or trying to get pregnant breast-feeding How should I use this medication? This drug is given as an infusion or injection into a vein. It is administered in a hospital or clinic by a specially trained health care professional. Talk to your pediatrician regarding the use of this medicine in children. Special care may be needed. Overdosage: If you think you have taken too much of this medicine contact a poison control center or emergency room at once. NOTE: This medicine is only for you. Do not share this medicine with others. What if I miss a dose? It is important not to miss your dose. Call your doctor or health care professional if you are unable to keep an appointment. What may interact with this medication? Do not take this medicine with any of the following medications: live virus vaccines This medicine may also interact with the following medications: medicines that treat or prevent blood clots like warfarin, enoxaparin, and dalteparin This list may not  describe all possible interactions. Give your health care provider a list of all the medicines, herbs, non-prescription drugs, or dietary supplements you use. Also tell them if you smoke, drink alcohol, or use illegal drugs. Some items may interact with your medicine. What should I watch for while using this medication? Visit your doctor for checks on your progress. This drug may make you feel generally unwell. This is not uncommon, as chemotherapy can affect healthy cells as well as cancer cells. Report any side effects. Continue your course of treatment even though you feel ill unless your doctor tells you to stop. In some cases, you may be given additional medicines to help with side effects. Follow all directions for their use. Call your doctor or health care professional for advice if you get a fever, chills or sore throat, or other symptoms of a cold or flu. Do  not treat yourself. This drug decreases your body's ability to fight infections. Try to avoid being around people who are sick. This medicine may increase your risk to bruise or bleed. Call your doctor or health care professional if you notice any unusual bleeding. Be careful brushing and flossing your teeth or using a toothpick because you may get an infection or bleed more easily. If you have any dental work done, tell your dentist you are receiving this medicine. Avoid taking products that contain aspirin, acetaminophen, ibuprofen, naproxen, or ketoprofen unless instructed by your doctor. These medicines may hide a fever. Do not become pregnant while taking this medicine. Women should inform their doctor if they wish to become pregnant or think they might be pregnant. There is a potential for serious side effects to an unborn child. Talk to your health care professional or pharmacist for more information. Do not breast-feed an infant while taking this medicine. Men should inform their doctor if they wish to father a child. This medicine may lower sperm counts. Do not treat diarrhea with over the counter products. Contact your doctor if you have diarrhea that lasts more than 2 days or if it is severe and watery. This medicine can make you more sensitive to the sun. Keep out of the sun. If you cannot avoid being in the sun, wear protective clothing and use sunscreen. Do not use sun lamps or tanning beds/booths. What side effects may I notice from receiving this medication? Side effects that you should report to your doctor or health care professional as soon as possible: allergic reactions like skin rash, itching or hives, swelling of the face, lips, or tongue low blood counts - this medicine may decrease the number of white blood cells, red blood cells and platelets. You may be at increased risk for infections and bleeding. signs of infection - fever or chills, cough, sore throat, pain or difficulty  passing urine signs of decreased platelets or bleeding - bruising, pinpoint red spots on the skin, black, tarry stools, blood in the urine signs of decreased red blood cells - unusually weak or tired, fainting spells, lightheadedness breathing problems changes in vision chest pain mouth sores nausea and vomiting pain, swelling, redness at site where injected pain, tingling, numbness in the hands or feet redness, swelling, or sores on hands or feet stomach pain unusual bleeding Side effects that usually do not require medical attention (report to your doctor or health care professional if they continue or are bothersome): changes in finger or toe nails diarrhea dry or itchy skin hair loss headache loss of appetite sensitivity of eyes to the  light stomach upset unusually teary eyes This list may not describe all possible side effects. Call your doctor for medical advice about side effects. You may report side effects to FDA at 1-800-FDA-1088. Where should I keep my medication? This drug is given in a hospital or clinic and will not be stored at home. NOTE: This sheet is a summary. It may not cover all possible information. If you have questions about this medicine, talk to your doctor, pharmacist, or health care provider.  2022 Elsevier/Gold Standard (2019-04-18 15:00:03) Bevacizumab injection What is this medication? BEVACIZUMAB (be va SIZ yoo mab) is a monoclonal antibody. It is used to treat many types of cancer. This medicine may be used for other purposes; ask your health care provider or pharmacist if you have questions. COMMON BRAND NAME(S): Avastin, MVASI, Noah Charon What should I tell my care team before I take this medication? They need to know if you have any of these conditions: diabetes heart disease high blood pressure history of coughing up blood prior anthracycline chemotherapy (e.g., doxorubicin, daunorubicin, epirubicin) recent or ongoing radiation  therapy recent or planning to have surgery stroke an unusual or allergic reaction to bevacizumab, hamster proteins, mouse proteins, other medicines, foods, dyes, or preservatives pregnant or trying to get pregnant breast-feeding How should I use this medication? This medicine is for infusion into a vein. It is given by a health care professional in a hospital or clinic setting. Talk to your pediatrician regarding the use of this medicine in children. Special care may be needed. Overdosage: If you think you have taken too much of this medicine contact a poison control center or emergency room at once. NOTE: This medicine is only for you. Do not share this medicine with others. What if I miss a dose? It is important not to miss your dose. Call your doctor or health care professional if you are unable to keep an appointment. What may interact with this medication? Interactions are not expected. This list may not describe all possible interactions. Give your health care provider a list of all the medicines, herbs, non-prescription drugs, or dietary supplements you use. Also tell them if you smoke, drink alcohol, or use illegal drugs. Some items may interact with your medicine. What should I watch for while using this medication? Your condition will be monitored carefully while you are receiving this medicine. You will need important blood work and urine testing done while you are taking this medicine. This medicine may increase your risk to bruise or bleed. Call your doctor or health care professional if you notice any unusual bleeding. Before having surgery, talk to your health care provider to make sure it is ok. This drug can increase the risk of poor healing of your surgical site or wound. You will need to stop this drug for 28 days before surgery. After surgery, wait at least 28 days before restarting this drug. Make sure the surgical site or wound is healed enough before restarting this drug.  Talk to your health care provider if questions. Do not become pregnant while taking this medicine or for 6 months after stopping it. Women should inform their doctor if they wish to become pregnant or think they might be pregnant. There is a potential for serious side effects to an unborn child. Talk to your health care professional or pharmacist for more information. Do not breast-feed an infant while taking this medicine and for 6 months after the last dose. This medicine has caused ovarian failure in some  women. This medicine may interfere with the ability to have a child. You should talk to your doctor or health care professional if you are concerned about your fertility. What side effects may I notice from receiving this medication? Side effects that you should report to your doctor or health care professional as soon as possible: allergic reactions like skin rash, itching or hives, swelling of the face, lips, or tongue chest pain or chest tightness chills coughing up blood high fever seizures severe constipation signs and symptoms of bleeding such as bloody or black, tarry stools; red or dark-brown urine; spitting up blood or brown material that looks like coffee grounds; red spots on the skin; unusual bruising or bleeding from the eye, gums, or nose signs and symptoms of a blood clot such as breathing problems; chest pain; severe, sudden headache; pain, swelling, warmth in the leg signs and symptoms of a stroke like changes in vision; confusion; trouble speaking or understanding; severe headaches; sudden numbness or weakness of the face, arm or leg; trouble walking; dizziness; loss of balance or coordination stomach pain sweating swelling of legs or ankles vomiting weight gain Side effects that usually do not require medical attention (report to your doctor or health care professional if they continue or are bothersome): back pain changes in taste decreased appetite dry  skin nausea tiredness This list may not describe all possible side effects. Call your doctor for medical advice about side effects. You may report side effects to FDA at 1-800-FDA-1088. Where should I keep my medication? This drug is given in a hospital or clinic and will not be stored at home. NOTE: This sheet is a summary. It may not cover all possible information. If you have questions about this medicine, talk to your doctor, pharmacist, or health care provider.  2022 Elsevier/Gold Standard (2019-03-15 10:50:46)

## 2021-03-20 ENCOUNTER — Other Ambulatory Visit: Payer: Self-pay

## 2021-03-20 ENCOUNTER — Inpatient Hospital Stay: Payer: PRIVATE HEALTH INSURANCE

## 2021-03-20 VITALS — BP 114/80 | HR 99 | Temp 98.8°F | Resp 18 | Ht 68.0 in | Wt 194.8 lb

## 2021-03-20 DIAGNOSIS — C182 Malignant neoplasm of ascending colon: Secondary | ICD-10-CM

## 2021-03-20 DIAGNOSIS — Z5111 Encounter for antineoplastic chemotherapy: Secondary | ICD-10-CM | POA: Diagnosis not present

## 2021-03-20 MED ORDER — HEPARIN SOD (PORK) LOCK FLUSH 100 UNIT/ML IV SOLN
500.0000 [IU] | Freq: Once | INTRAVENOUS | Status: AC | PRN
Start: 2021-03-20 — End: 2021-03-20
  Administered 2021-03-20: 500 [IU]

## 2021-03-20 MED ORDER — SODIUM CHLORIDE 0.9% FLUSH
10.0000 mL | INTRAVENOUS | Status: DC | PRN
  Administered 2021-03-20: 10 mL

## 2021-03-20 MED ORDER — SODIUM CHLORIDE 0.9 % IV SOLN: Freq: Once | INTRAVENOUS | Status: DC

## 2021-03-20 NOTE — Progress Notes (Signed)
1444: Patient declined IVF today states she is eating and drinking better. Doesn't feel she needs them today.PT STABLE AT TIME OF DISCHARGE

## 2021-03-24 ENCOUNTER — Encounter: Payer: Self-pay | Admitting: Oncology

## 2021-03-25 ENCOUNTER — Other Ambulatory Visit: Payer: Self-pay

## 2021-03-25 ENCOUNTER — Inpatient Hospital Stay: Payer: PRIVATE HEALTH INSURANCE

## 2021-03-25 VITALS — BP 116/79 | HR 94 | Temp 97.9°F | Resp 18 | Ht 68.0 in | Wt 189.1 lb

## 2021-03-25 DIAGNOSIS — C182 Malignant neoplasm of ascending colon: Secondary | ICD-10-CM

## 2021-03-25 DIAGNOSIS — Z5111 Encounter for antineoplastic chemotherapy: Secondary | ICD-10-CM | POA: Diagnosis not present

## 2021-03-25 MED ORDER — FILGRASTIM-SNDZ 480 MCG/0.8ML IJ SOSY
480.0000 ug | PREFILLED_SYRINGE | Freq: Once | INTRAMUSCULAR | Status: AC
  Administered 2021-03-25: 480 ug via SUBCUTANEOUS
  Filled 2021-03-25: qty 0.8

## 2021-03-25 NOTE — Progress Notes (Signed)
[facility]  [address] [place],  [place]  [address] [phone]  Clinic Day:  03/31/2021  Referring physician: Greig Right, MD  This document serves as a record of services personally performed by Dequincy Macarthur Critchley, MD. It was created on their behalf by [facility], a trained medical scribe. The creation of this record is based on the scribe's personal observations and the provider's statements to them.  HISTORY OF PRESENT ILLNESS:  The patient is a 55 y.o. female with metastatic colon cancer, which includes spread of disease to her abdominal cavity, lungs, brain and right adrenal gland. She comes in today prior to her 18th cycle of FOLFIRI/Avastin.  The patient claims to have toleraed her 17th cycle of treatment fairly well.  Her diarrhea remains prominent despite receiving atropine to offset the effects of irinotecan, whose dose has been decreased over time.  She has also had minor skin breakage on her distal fingers.  She denies having any new symptoms/findings which concern her for disease progression.  Overall, she remains in good spirits.     With respect to her colon cancer history, she is status post a right hemicolectomy in early October 2018, followed by 12 cycles of adjuvant chemotherapy, which were completed in April 2019.  In December 2021, she underwent a left cerebellar metastatectomy, whose pathology was consistent with metastatic colon cancer.  Tumor testing did come back MMR normal.  CT scans recently showed evidence of her cancer being in multiple locations, which has her on palliative chemotherapy at this time.    PHYSICAL EXAM:  Blood pressure 122/77, pulse (!) 104, temperature 97.9 F (36.6 C), resp. rate 16, height 5' 8"  (1.727 m), weight 190 lb 12.8 oz (86.5 kg), last menstrual period 02/22/2019, SpO2 96 %. Wt Readings from Last 3 Encounters:  03/31/21 193 lb (87.5 kg)  03/31/21 190 lb 12.8 oz (86.5 kg)  03/26/21 183 lb (83  kg)   Body mass index is 29.01 kg/m. Performance status (ECOG): 1 Physical Exam Constitutional:      General: She is not in acute distress.    Appearance: Normal appearance. She is normal weight. She is not ill-appearing.  HENT:     Head: Normocephalic and atraumatic.  Eyes:     General: No scleral icterus.    Extraocular Movements: Extraocular movements intact.     Conjunctiva/sclera: Conjunctivae normal.     Pupils: Pupils are equal, round, and reactive to light.  Cardiovascular:     Rate and Rhythm: Regular rhythm. Tachycardia present.     Pulses: Normal pulses.     Heart sounds: Normal heart sounds. No murmur heard.   No friction rub. No gallop.  Pulmonary:     Effort: Pulmonary effort is normal. No respiratory distress.     Breath sounds: Normal breath sounds.  Abdominal:     General: Bowel sounds are normal. There is no distension.     Palpations: Abdomen is soft. There is no hepatomegaly, splenomegaly or mass.     Tenderness: There is no abdominal tenderness.  Musculoskeletal:        General: Normal range of motion.     Cervical back: Normal range of motion and neck supple.     Right lower leg: No edema.     Left lower leg: No edema.  Lymphadenopathy:     Cervical: No cervical adenopathy.  Skin:    General: Skin is warm and dry.  Neurological:     General: No focal  deficit present.     Mental Status: She is alert and oriented to person, place, and time. Mental status is at baseline.  Psychiatric:        Mood and Affect: Mood normal.        Behavior: Behavior normal.        Thought Content: Thought content normal.        Judgment: Judgment normal.    LABS:    Ref. Range 03/31/2021 00:00  Sodium Latest Ref Range: 137 - 147  141  Potassium Latest Ref Range: 3.4 - 5.3  3.7  Chloride Latest Ref Range: 99 - 108  105  CO2 Latest Ref Range: 13 - 22  27 (A)  Glucose Unknown 216  BUN Latest Ref Range: 4 - 21  12  Creatinine Latest Ref Range: 0.5 - 1.1  1.3 (A)   Calcium Latest Ref Range: 8.7 - 10.7  8.9  Alkaline Phosphatase Latest Ref Range: 25 - 125  172 (A)  Albumin Latest Ref Range: 3.5 - 5.0  4.1  AST Latest Ref Range: 13 - 35  31  ALT Latest Ref Range: 7 - 35  29  Bilirubin, Total Unknown 0.4  WBC Unknown 3.1  RBC Latest Ref Range: 3.87 - 5.11  3.64 (A)  Hemoglobin Latest Ref Range: 12.0 - 16.0  12.3  HCT Latest Ref Range: 36 - 46  37  Platelets Latest Ref Range: 150 - 399  110 (A)  NEUT# Unknown 1.02     ASSESSMENT & PLAN:  Assessment/Plan:  A 55 y.o. female with metastatic colon cancer.  As her absolute neutrophil count is low, I will arrange for her to receive a Zarxio shot today.  If her Anthem is better tomorrow, she will proceed with her 18th cycle of FOLFIRI/Avastin this week. Overall, she clinically looks great.  I will see her back in 2 weeks before she heads into her 19th cycle of  FOLFIRI/Avastin.  CT scans will be done before her next visit to ascertain her new disease baseline after 18 cycles of treatment.  The patient understands all the plans discussed today and is in agreement with them.     I, Rita Ohara, am acting as scribe for Marice Potter, MD    I have reviewed this report as typed by the medical scribe, and it is complete and accurate.  Dequincy Macarthur Critchley, MD

## 2021-03-25 NOTE — Progress Notes (Signed)
Discharged home, stable  

## 2021-03-25 NOTE — Patient Instructions (Signed)
Filgrastim, G-CSF injection What is this medication? FILGRASTIM, G-CSF (fil GRA stim) is a granulocyte colony-stimulating factor that stimulates the growth of neutrophils, a type of white blood cell (WBC) important in the body's fight against infection. It is used to reduce the incidence of fever and infection in patients with certain types of cancer who are receiving chemotherapy that affects the bone marrow, to stimulate blood cell production for removal of WBCs from the body prior to a bone marrow transplantation, to reduce the incidence of fever and infection in patients who have severe chronic neutropenia, and to improve survival outcomes following high-dose radiation exposure that is toxic to the bone marrow. This medicine may be used for other purposes; ask your health care provider or pharmacist if you have questions. COMMON BRAND NAME(S): Neupogen, Nivestym, Releuko, Zarxio What should I tell my care team before I take this medication? They need to know if you have any of these conditions: kidney disease latex allergy ongoing radiation therapy sickle cell disease an unusual or allergic reaction to filgrastim, pegfilgrastim, other medicines, foods, dyes, or preservatives pregnant or trying to get pregnant breast-feeding How should I use this medication? This medicine is for injection under the skin or infusion into a vein. As an infusion into a vein, it is usually given by a health care professional in a hospital or clinic setting. If you get this medicine at home, you will be taught how to prepare and give this medicine. Refer to the Instructions for Use that come with your medication packaging. Use exactly as directed. Take your medicine at regular intervals. Do not take your medicine more often than directed. It is important that you put your used needles and syringes in a special sharps container. Do not put them in a trash can. If you do not have a sharps container, call your pharmacist  or healthcare provider to get one. Talk to your pediatrician regarding the use of this medicine in children. While this drug may be prescribed for children as young as 7 months for selected conditions, precautions do apply. Overdosage: If you think you have taken too much of this medicine contact a poison control center or emergency room at once. NOTE: This medicine is only for you. Do not share this medicine with others. What if I miss a dose? It is important not to miss your dose. Call your doctor or health care professional if you miss a dose. What may interact with this medication? This medicine may interact with the following medications: medicines that may cause a release of neutrophils, such as lithium This list may not describe all possible interactions. Give your health care provider a list of all the medicines, herbs, non-prescription drugs, or dietary supplements you use. Also tell them if you smoke, drink alcohol, or use illegal drugs. Some items may interact with your medicine. What should I watch for while using this medication? Your condition will be monitored carefully while you are receiving this medicine. You may need blood work done while you are taking this medicine. Talk to your health care provider about your risk of cancer. You may be more at risk for certain types of cancer if you take this medicine. What side effects may I notice from receiving this medication? Side effects that you should report to your doctor or health care professional as soon as possible: allergic reactions like skin rash, itching or hives, swelling of the face, lips, or tongue back pain dizziness or feeling faint fever pain, redness, or   irritation at site where injected pinpoint red spots on the skin shortness of breath or breathing problems signs and symptoms of kidney injury like trouble passing urine, change in the amount of urine, or red or dark-brown urine stomach or side pain, or pain at  the shoulder swelling tiredness unusual bleeding or bruising Side effects that usually do not require medical attention (report to your doctor or health care professional if they continue or are bothersome): bone pain cough diarrhea hair loss headache muscle pain This list may not describe all possible side effects. Call your doctor for medical advice about side effects. You may report side effects to FDA at 1-800-FDA-1088. Where should I keep my medication? Keep out of the reach of children. Store in a refrigerator between 2 and 8 degrees C (36 and 46 degrees F). Do not freeze. Keep in carton to protect from light. Throw away this medicine if vials or syringes are left out of the refrigerator for more than 24 hours. Throw away any unused medicine after the expiration date. NOTE: This sheet is a summary. It may not cover all possible information. If you have questions about this medicine, talk to your doctor, pharmacist, or health care provider.  2022 Elsevier/Gold Standard (2019-06-08 18:47:55)  

## 2021-03-26 ENCOUNTER — Inpatient Hospital Stay: Payer: PRIVATE HEALTH INSURANCE

## 2021-03-26 VITALS — BP 104/68 | HR 88 | Temp 98.1°F | Resp 18 | Wt 183.0 lb

## 2021-03-26 DIAGNOSIS — C182 Malignant neoplasm of ascending colon: Secondary | ICD-10-CM

## 2021-03-26 DIAGNOSIS — Z5111 Encounter for antineoplastic chemotherapy: Secondary | ICD-10-CM | POA: Diagnosis not present

## 2021-03-26 MED ORDER — FILGRASTIM-SNDZ 480 MCG/0.8ML IJ SOSY
480.0000 ug | PREFILLED_SYRINGE | Freq: Once | INTRAMUSCULAR | Status: AC
Start: 2021-03-26 — End: 2021-03-26
  Administered 2021-03-26: 480 ug via SUBCUTANEOUS
  Filled 2021-03-26: qty 0.8

## 2021-03-26 NOTE — Progress Notes (Signed)
Discharged home, stable. Patient states understanding to call for any worsening anxiety or depression.

## 2021-03-26 NOTE — Patient Instructions (Signed)
Filgrastim, G-CSF injection What is this medication? FILGRASTIM, G-CSF (fil GRA stim) is a granulocyte colony-stimulating factor that stimulates the growth of neutrophils, a type of white blood cell (WBC) important in the body's fight against infection. It is used to reduce the incidence of fever and infection in patients with certain types of cancer who are receiving chemotherapy that affects the bone marrow, to stimulate blood cell production for removal of WBCs from the body prior to a bone marrow transplantation, to reduce the incidence of fever and infection in patients who have severe chronic neutropenia, and to improve survival outcomes following high-dose radiation exposure that is toxic to the bone marrow. This medicine may be used for other purposes; ask your health care provider or pharmacist if you have questions. COMMON BRAND NAME(S): Neupogen, Nivestym, Releuko, Zarxio What should I tell my care team before I take this medication? They need to know if you have any of these conditions: kidney disease latex allergy ongoing radiation therapy sickle cell disease an unusual or allergic reaction to filgrastim, pegfilgrastim, other medicines, foods, dyes, or preservatives pregnant or trying to get pregnant breast-feeding How should I use this medication? This medicine is for injection under the skin or infusion into a vein. As an infusion into a vein, it is usually given by a health care professional in a hospital or clinic setting. If you get this medicine at home, you will be taught how to prepare and give this medicine. Refer to the Instructions for Use that come with your medication packaging. Use exactly as directed. Take your medicine at regular intervals. Do not take your medicine more often than directed. It is important that you put your used needles and syringes in a special sharps container. Do not put them in a trash can. If you do not have a sharps container, call your pharmacist  or healthcare provider to get one. Talk to your pediatrician regarding the use of this medicine in children. While this drug may be prescribed for children as young as 7 months for selected conditions, precautions do apply. Overdosage: If you think you have taken too much of this medicine contact a poison control center or emergency room at once. NOTE: This medicine is only for you. Do not share this medicine with others. What if I miss a dose? It is important not to miss your dose. Call your doctor or health care professional if you miss a dose. What may interact with this medication? This medicine may interact with the following medications: medicines that may cause a release of neutrophils, such as lithium This list may not describe all possible interactions. Give your health care provider a list of all the medicines, herbs, non-prescription drugs, or dietary supplements you use. Also tell them if you smoke, drink alcohol, or use illegal drugs. Some items may interact with your medicine. What should I watch for while using this medication? Your condition will be monitored carefully while you are receiving this medicine. You may need blood work done while you are taking this medicine. Talk to your health care provider about your risk of cancer. You may be more at risk for certain types of cancer if you take this medicine. What side effects may I notice from receiving this medication? Side effects that you should report to your doctor or health care professional as soon as possible: allergic reactions like skin rash, itching or hives, swelling of the face, lips, or tongue back pain dizziness or feeling faint fever pain, redness, or   irritation at site where injected pinpoint red spots on the skin shortness of breath or breathing problems signs and symptoms of kidney injury like trouble passing urine, change in the amount of urine, or red or dark-brown urine stomach or side pain, or pain at  the shoulder swelling tiredness unusual bleeding or bruising Side effects that usually do not require medical attention (report to your doctor or health care professional if they continue or are bothersome): bone pain cough diarrhea hair loss headache muscle pain This list may not describe all possible side effects. Call your doctor for medical advice about side effects. You may report side effects to FDA at 1-800-FDA-1088. Where should I keep my medication? Keep out of the reach of children. Store in a refrigerator between 2 and 8 degrees C (36 and 46 degrees F). Do not freeze. Keep in carton to protect from light. Throw away this medicine if vials or syringes are left out of the refrigerator for more than 24 hours. Throw away any unused medicine after the expiration date. NOTE: This sheet is a summary. It may not cover all possible information. If you have questions about this medicine, talk to your doctor, pharmacist, or health care provider.  2022 Elsevier/Gold Standard (2019-06-08 18:47:55)  

## 2021-03-28 ENCOUNTER — Other Ambulatory Visit: Payer: No Typology Code available for payment source

## 2021-03-28 ENCOUNTER — Ambulatory Visit: Payer: No Typology Code available for payment source | Admitting: Oncology

## 2021-03-31 ENCOUNTER — Encounter: Payer: Self-pay | Admitting: Oncology

## 2021-03-31 ENCOUNTER — Other Ambulatory Visit: Payer: Self-pay | Admitting: Oncology

## 2021-03-31 ENCOUNTER — Inpatient Hospital Stay (INDEPENDENT_AMBULATORY_CARE_PROVIDER_SITE_OTHER): Payer: PRIVATE HEALTH INSURANCE | Admitting: Oncology

## 2021-03-31 ENCOUNTER — Ambulatory Visit: Payer: No Typology Code available for payment source

## 2021-03-31 ENCOUNTER — Inpatient Hospital Stay: Payer: PRIVATE HEALTH INSURANCE

## 2021-03-31 ENCOUNTER — Other Ambulatory Visit: Payer: Self-pay

## 2021-03-31 VITALS — BP 96/73 | HR 109 | Temp 97.9°F | Resp 16 | Ht 68.0 in | Wt 193.0 lb

## 2021-03-31 VITALS — BP 122/77 | HR 104 | Temp 97.9°F | Resp 16 | Ht 68.0 in | Wt 190.8 lb

## 2021-03-31 DIAGNOSIS — C182 Malignant neoplasm of ascending colon: Secondary | ICD-10-CM

## 2021-03-31 DIAGNOSIS — Z5111 Encounter for antineoplastic chemotherapy: Secondary | ICD-10-CM | POA: Diagnosis not present

## 2021-03-31 LAB — BASIC METABOLIC PANEL
BUN: 12 (ref 4–21)
CO2: 27 — AB (ref 13–22)
Chloride: 105 (ref 99–108)
Creatinine: 1.3 — AB (ref 0.5–1.1)
Glucose: 216
Potassium: 3.7 (ref 3.4–5.3)
Sodium: 141 (ref 137–147)

## 2021-03-31 LAB — TOTAL PROTEIN, URINE DIPSTICK: Protein, ur: NEGATIVE mg/dL

## 2021-03-31 LAB — HEPATIC FUNCTION PANEL
ALT: 29 (ref 7–35)
AST: 31 (ref 13–35)
Alkaline Phosphatase: 172 — AB (ref 25–125)
Bilirubin, Total: 0.4

## 2021-03-31 LAB — CBC AND DIFFERENTIAL
HCT: 37 (ref 36–46)
Hemoglobin: 12.3 (ref 12.0–16.0)
Neutrophils Absolute: 1.02
Platelets: 110 — AB (ref 150–399)
WBC: 3.1

## 2021-03-31 LAB — COMPREHENSIVE METABOLIC PANEL
Albumin: 4.1 (ref 3.5–5.0)
Calcium: 8.9 (ref 8.7–10.7)

## 2021-03-31 LAB — CBC: RBC: 3.64 — AB (ref 3.87–5.11)

## 2021-03-31 MED ORDER — FILGRASTIM-SNDZ 480 MCG/0.8ML IJ SOSY
480.0000 ug | PREFILLED_SYRINGE | Freq: Once | INTRAMUSCULAR | Status: AC
  Administered 2021-03-31: 480 ug via SUBCUTANEOUS
  Filled 2021-03-31: qty 0.8

## 2021-03-31 MED FILL — Leucovorin Calcium For Inj 350 MG: INTRAMUSCULAR | Qty: 41.8 | Status: AC

## 2021-03-31 MED FILL — Bevacizumab-bvzr IV Soln 400 MG/16ML (For Infusion): INTRAVENOUS | Qty: 18 | Status: AC

## 2021-03-31 MED FILL — Fosaprepitant Dimeglumine For IV Infusion 150 MG (Base Eq): INTRAVENOUS | Qty: 5 | Status: AC

## 2021-03-31 MED FILL — Fluorouracil IV Soln 2.5 GM/50ML (50 MG/ML): INTRAVENOUS | Qty: 17 | Status: AC

## 2021-03-31 MED FILL — Dexamethasone Sodium Phosphate Inj 100 MG/10ML: INTRAMUSCULAR | Qty: 1 | Status: AC

## 2021-03-31 MED FILL — Fluorouracil IV Soln 5 GM/100ML (50 MG/ML): INTRAVENOUS | Qty: 100 | Status: AC

## 2021-03-31 MED FILL — Irinotecan HCl Inj 100 MG/5ML (20 MG/ML): INTRAVENOUS | Qty: 14 | Status: AC

## 2021-03-31 NOTE — Patient Instructions (Signed)
Filgrastim, G-CSF injection What is this medication? FILGRASTIM, G-CSF (fil GRA stim) is a granulocyte colony-stimulating factor that stimulates the growth of neutrophils, a type of white blood cell (WBC) important in the body's fight against infection. It is used to reduce the incidence of fever and infection in patients with certain types of cancer who are receiving chemotherapy that affects the bone marrow, to stimulate blood cell production for removal of WBCs from the body prior to a bone marrow transplantation, to reduce the incidence of fever and infection in patients who have severe chronic neutropenia, and to improve survival outcomes following high-dose radiation exposure that is toxic to the bone marrow. This medicine may be used for other purposes; ask your health care provider or pharmacist if you have questions. COMMON BRAND NAME(S): Neupogen, Nivestym, Releuko, Zarxio What should I tell my care team before I take this medication? They need to know if you have any of these conditions: kidney disease latex allergy ongoing radiation therapy sickle cell disease an unusual or allergic reaction to filgrastim, pegfilgrastim, other medicines, foods, dyes, or preservatives pregnant or trying to get pregnant breast-feeding How should I use this medication? This medicine is for injection under the skin or infusion into a vein. As an infusion into a vein, it is usually given by a health care professional in a hospital or clinic setting. If you get this medicine at home, you will be taught how to prepare and give this medicine. Refer to the Instructions for Use that come with your medication packaging. Use exactly as directed. Take your medicine at regular intervals. Do not take your medicine more often than directed. It is important that you put your used needles and syringes in a special sharps container. Do not put them in a trash can. If you do not have a sharps container, call your pharmacist  or healthcare provider to get one. Talk to your pediatrician regarding the use of this medicine in children. While this drug may be prescribed for children as young as 7 months for selected conditions, precautions do apply. Overdosage: If you think you have taken too much of this medicine contact a poison control center or emergency room at once. NOTE: This medicine is only for you. Do not share this medicine with others. What if I miss a dose? It is important not to miss your dose. Call your doctor or health care professional if you miss a dose. What may interact with this medication? This medicine may interact with the following medications: medicines that may cause a release of neutrophils, such as lithium This list may not describe all possible interactions. Give your health care provider a list of all the medicines, herbs, non-prescription drugs, or dietary supplements you use. Also tell them if you smoke, drink alcohol, or use illegal drugs. Some items may interact with your medicine. What should I watch for while using this medication? Your condition will be monitored carefully while you are receiving this medicine. You may need blood work done while you are taking this medicine. Talk to your health care provider about your risk of cancer. You may be more at risk for certain types of cancer if you take this medicine. What side effects may I notice from receiving this medication? Side effects that you should report to your doctor or health care professional as soon as possible: allergic reactions like skin rash, itching or hives, swelling of the face, lips, or tongue back pain dizziness or feeling faint fever pain, redness, or   irritation at site where injected pinpoint red spots on the skin shortness of breath or breathing problems signs and symptoms of kidney injury like trouble passing urine, change in the amount of urine, or red or dark-brown urine stomach or side pain, or pain at  the shoulder swelling tiredness unusual bleeding or bruising Side effects that usually do not require medical attention (report to your doctor or health care professional if they continue or are bothersome): bone pain cough diarrhea hair loss headache muscle pain This list may not describe all possible side effects. Call your doctor for medical advice about side effects. You may report side effects to FDA at 1-800-FDA-1088. Where should I keep my medication? Keep out of the reach of children. Store in a refrigerator between 2 and 8 degrees C (36 and 46 degrees F). Do not freeze. Keep in carton to protect from light. Throw away this medicine if vials or syringes are left out of the refrigerator for more than 24 hours. Throw away any unused medicine after the expiration date. NOTE: This sheet is a summary. It may not cover all possible information. If you have questions about this medicine, talk to your doctor, pharmacist, or health care provider.  2022 Elsevier/Gold Standard (2019-06-08 18:47:55)  

## 2021-03-31 NOTE — Progress Notes (Signed)
Discharged home, stable- Ambulatory without difficulty.

## 2021-04-01 ENCOUNTER — Other Ambulatory Visit: Payer: Self-pay | Admitting: Hematology and Oncology

## 2021-04-01 ENCOUNTER — Encounter: Payer: Self-pay | Admitting: Hematology and Oncology

## 2021-04-01 ENCOUNTER — Inpatient Hospital Stay: Payer: PRIVATE HEALTH INSURANCE | Attending: Oncology

## 2021-04-01 ENCOUNTER — Encounter: Payer: Self-pay | Admitting: Oncology

## 2021-04-01 ENCOUNTER — Inpatient Hospital Stay: Payer: PRIVATE HEALTH INSURANCE

## 2021-04-01 VITALS — BP 108/74 | HR 112 | Temp 97.9°F | Resp 18 | Ht 68.0 in | Wt 189.0 lb

## 2021-04-01 DIAGNOSIS — C182 Malignant neoplasm of ascending colon: Secondary | ICD-10-CM

## 2021-04-01 DIAGNOSIS — Z5189 Encounter for other specified aftercare: Secondary | ICD-10-CM | POA: Diagnosis not present

## 2021-04-01 DIAGNOSIS — C189 Malignant neoplasm of colon, unspecified: Secondary | ICD-10-CM | POA: Insufficient documentation

## 2021-04-01 DIAGNOSIS — C7931 Secondary malignant neoplasm of brain: Secondary | ICD-10-CM | POA: Insufficient documentation

## 2021-04-01 DIAGNOSIS — C7971 Secondary malignant neoplasm of right adrenal gland: Secondary | ICD-10-CM | POA: Insufficient documentation

## 2021-04-01 DIAGNOSIS — Z5111 Encounter for antineoplastic chemotherapy: Secondary | ICD-10-CM | POA: Insufficient documentation

## 2021-04-01 DIAGNOSIS — Z5112 Encounter for antineoplastic immunotherapy: Secondary | ICD-10-CM | POA: Insufficient documentation

## 2021-04-01 DIAGNOSIS — C7801 Secondary malignant neoplasm of right lung: Secondary | ICD-10-CM | POA: Diagnosis not present

## 2021-04-01 DIAGNOSIS — C7802 Secondary malignant neoplasm of left lung: Secondary | ICD-10-CM | POA: Diagnosis not present

## 2021-04-01 LAB — BASIC METABOLIC PANEL
BUN: 13 (ref 4–21)
CO2: 21 (ref 13–22)
Chloride: 103 (ref 99–108)
Creatinine: 1.3 — AB (ref 0.5–1.1)
Glucose: 261
Potassium: 3.9 (ref 3.4–5.3)
Sodium: 138 (ref 137–147)

## 2021-04-01 LAB — CBC AND DIFFERENTIAL
HCT: 38 (ref 36–46)
Hemoglobin: 12.3 (ref 12.0–16.0)
Neutrophils Absolute: 7.99
Platelets: 97 — AB (ref 150–399)
WBC: 10.8

## 2021-04-01 LAB — COMPREHENSIVE METABOLIC PANEL
Albumin: 4.1 (ref 3.5–5.0)
Calcium: 9.2 (ref 8.7–10.7)

## 2021-04-01 LAB — HEPATIC FUNCTION PANEL
ALT: 30 (ref 7–35)
AST: 39 — AB (ref 13–35)
Alkaline Phosphatase: 197 — AB (ref 25–125)
Bilirubin, Total: 0.5

## 2021-04-01 LAB — CBC: RBC: 3.71 — AB (ref 3.87–5.11)

## 2021-04-01 MED ORDER — HEPARIN SOD (PORK) LOCK FLUSH 100 UNIT/ML IV SOLN
500.0000 [IU] | Freq: Once | INTRAVENOUS | Status: DC | PRN
Start: 2021-04-01 — End: 2021-04-01

## 2021-04-01 MED ORDER — SODIUM CHLORIDE 0.9 % IV SOLN: Freq: Once | INTRAVENOUS | Status: AC

## 2021-04-01 MED ORDER — ATROPINE SULFATE 1 MG/ML IV SOLN
0.5000 mg | Freq: Once | INTRAVENOUS | Status: AC | PRN
  Administered 2021-04-01: 0.5 mg via INTRAVENOUS
  Filled 2021-04-01: qty 1

## 2021-04-01 MED ORDER — SODIUM CHLORIDE 0.9 % IV SOLN
5.0000 mg/kg | Freq: Once | INTRAVENOUS | Status: AC
  Administered 2021-04-01: 450 mg via INTRAVENOUS
  Filled 2021-04-01: qty 16

## 2021-04-01 MED ORDER — LORAZEPAM 1 MG PO TABS
1.0000 mg | ORAL_TABLET | Freq: Once | ORAL | Status: AC
  Administered 2021-04-01: 1 mg via ORAL
  Filled 2021-04-01: qty 1

## 2021-04-01 MED ORDER — FLUOROURACIL CHEMO INJECTION 2.5 GM/50ML
400.0000 mg/m2 | Freq: Once | INTRAVENOUS | Status: AC
  Administered 2021-04-01: 850 mg via INTRAVENOUS
  Filled 2021-04-01: qty 17

## 2021-04-01 MED ORDER — SODIUM CHLORIDE 0.9 % IV SOLN
400.0000 mg/m2 | Freq: Once | INTRAVENOUS | Status: AC
  Administered 2021-04-01: 836 mg via INTRAVENOUS
  Filled 2021-04-01: qty 41.8

## 2021-04-01 MED ORDER — SODIUM CHLORIDE 0.9 % IV SOLN: Freq: Once | INTRAVENOUS | Status: DC

## 2021-04-01 MED ORDER — FLUOROURACIL CHEMO INJECTION 5 GM/100ML
2400.0000 mg/m2 | INTRAVENOUS | Status: DC
  Administered 2021-04-01: 5000 mg via INTRAVENOUS
  Filled 2021-04-01: qty 100

## 2021-04-01 MED ORDER — SODIUM CHLORIDE 0.9 % IV SOLN
10.0000 mg | Freq: Once | INTRAVENOUS | Status: AC
  Administered 2021-04-01: 10 mg via INTRAVENOUS
  Filled 2021-04-01: qty 10

## 2021-04-01 MED ORDER — SODIUM CHLORIDE 0.9% FLUSH: 10.0000 mL | INTRAVENOUS | Status: DC | PRN

## 2021-04-01 MED ORDER — SODIUM CHLORIDE 0.9 % IV SOLN
150.0000 mg | Freq: Once | INTRAVENOUS | Status: AC
  Administered 2021-04-01: 150 mg via INTRAVENOUS
  Filled 2021-04-01: qty 150

## 2021-04-01 MED ORDER — PALONOSETRON HCL INJECTION 0.25 MG/5ML
0.2500 mg | Freq: Once | INTRAVENOUS | Status: AC
  Administered 2021-04-01: 0.25 mg via INTRAVENOUS
  Filled 2021-04-01: qty 5

## 2021-04-01 MED ORDER — SODIUM CHLORIDE 0.9 % IV SOLN
135.0000 mg/m2 | Freq: Once | INTRAVENOUS | Status: AC
  Administered 2021-04-01: 280 mg via INTRAVENOUS
  Filled 2021-04-01: qty 10

## 2021-04-01 NOTE — Progress Notes (Signed)
PER DR LEWIS, OK TO PROCEED WITH CHEMO TODAY DESPITE PLATELET COUNT OF 97. ANC =7, WBC 10.8

## 2021-04-01 NOTE — Patient Instructions (Signed)
Bevacizumab injection What is this medication? BEVACIZUMAB (be va SIZ yoo mab) is a monoclonal antibody. It is used to treat many types of cancer. This medicine may be used for other purposes; ask your health care provider or pharmacist if you have questions. COMMON BRAND NAME(S): Avastin, MVASI, Noah Charon What should I tell my care team before I take this medication? They need to know if you have any of these conditions: diabetes heart disease high blood pressure history of coughing up blood prior anthracycline chemotherapy (e.g., doxorubicin, daunorubicin, epirubicin) recent or ongoing radiation therapy recent or planning to have surgery stroke an unusual or allergic reaction to bevacizumab, hamster proteins, mouse proteins, other medicines, foods, dyes, or preservatives pregnant or trying to get pregnant breast-feeding How should I use this medication? This medicine is for infusion into a vein. It is given by a health care professional in a hospital or clinic setting. Talk to your pediatrician regarding the use of this medicine in children. Special care may be needed. Overdosage: If you think you have taken too much of this medicine contact a poison control center or emergency room at once. NOTE: This medicine is only for you. Do not share this medicine with others. What if I miss a dose? It is important not to miss your dose. Call your doctor or health care professional if you are unable to keep an appointment. What may interact with this medication? Interactions are not expected. This list may not describe all possible interactions. Give your health care provider a list of all the medicines, herbs, non-prescription drugs, or dietary supplements you use. Also tell them if you smoke, drink alcohol, or use illegal drugs. Some items may interact with your medicine. What should I watch for while using this medication? Your condition will be monitored carefully while you are receiving this  medicine. You will need important blood work and urine testing done while you are taking this medicine. This medicine may increase your risk to bruise or bleed. Call your doctor or health care professional if you notice any unusual bleeding. Before having surgery, talk to your health care provider to make sure it is ok. This drug can increase the risk of poor healing of your surgical site or wound. You will need to stop this drug for 28 days before surgery. After surgery, wait at least 28 days before restarting this drug. Make sure the surgical site or wound is healed enough before restarting this drug. Talk to your health care provider if questions. Do not become pregnant while taking this medicine or for 6 months after stopping it. Women should inform their doctor if they wish to become pregnant or think they might be pregnant. There is a potential for serious side effects to an unborn child. Talk to your health care professional or pharmacist for more information. Do not breast-feed an infant while taking this medicine and for 6 months after the last dose. This medicine has caused ovarian failure in some women. This medicine may interfere with the ability to have a child. You should talk to your doctor or health care professional if you are concerned about your fertility. What side effects may I notice from receiving this medication? Side effects that you should report to your doctor or health care professional as soon as possible: allergic reactions like skin rash, itching or hives, swelling of the face, lips, or tongue chest pain or chest tightness chills coughing up blood high fever seizures severe constipation signs and symptoms of bleeding such  as bloody or black, tarry stools; red or dark-brown urine; spitting up blood or brown material that looks like coffee grounds; red spots on the skin; unusual bruising or bleeding from the eye, gums, or nose signs and symptoms of a blood clot such as  breathing problems; chest pain; severe, sudden headache; pain, swelling, warmth in the leg signs and symptoms of a stroke like changes in vision; confusion; trouble speaking or understanding; severe headaches; sudden numbness or weakness of the face, arm or leg; trouble walking; dizziness; loss of balance or coordination stomach pain sweating swelling of legs or ankles vomiting weight gain Side effects that usually do not require medical attention (report to your doctor or health care professional if they continue or are bothersome): back pain changes in taste decreased appetite dry skin nausea tiredness This list may not describe all possible side effects. Call your doctor for medical advice about side effects. You may report side effects to FDA at 1-800-FDA-1088. Where should I keep my medication? This drug is given in a hospital or clinic and will not be stored at home. NOTE: This sheet is a summary. It may not cover all possible information. If you have questions about this medicine, talk to your doctor, pharmacist, or health care provider.  2022 [employer] Standard (2019-03-15 10:50:46) Irinotecan injection What is this medication? IRINOTECAN (ir in oh TEE kan ) is a chemotherapy drug. It is used to treat colon and rectal cancer. This medicine may be used for other purposes; ask your health care provider or pharmacist if you have questions. COMMON BRAND NAME(S): Camptosar What should I tell my care team before I take this medication? They need to know if you have any of these conditions: dehydration diarrhea infection (especially a virus infection such as chickenpox, cold sores, or herpes) liver disease low blood counts, like low white cell, platelet, or red cell counts low levels of calcium, magnesium, or potassium in the blood recent or ongoing radiation therapy an unusual or allergic reaction to irinotecan, other medicines, foods, dyes, or preservatives pregnant or  trying to get pregnant breast-feeding How should I use this medication? This drug is given as an infusion into a vein. It is administered in a hospital or clinic by a specially trained health care professional. Talk to your pediatrician regarding the use of this medicine in children. Special care may be needed. Overdosage: If you think you have taken too much of this medicine contact a poison control center or emergency room at once. NOTE: This medicine is only for you. Do not share this medicine with others. What if I miss a dose? It is important not to miss your dose. Call your doctor or health care professional if you are unable to keep an appointment. What may interact with this medication? Do not take this medicine with any of the following medications: cobicistat itraconazole This medicine may interact with the following medications: antiviral medicines for HIV or AIDS certain antibiotics like rifampin or rifabutin certain medicines for fungal infections like ketoconazole, posaconazole, and voriconazole certain medicines for seizures like carbamazepine, phenobarbital, phenotoin clarithromycin gemfibrozil nefazodone St. John's Wort This list may not describe all possible interactions. Give your health care provider a list of all the medicines, herbs, non-prescription drugs, or dietary supplements you use. Also tell them if you smoke, drink alcohol, or use illegal drugs. Some items may interact with your medicine. What should I watch for while using this medication? Your condition will be monitored carefully while you are receiving  this medicine. You will need important blood work done while you are taking this medicine. This drug may make you feel generally unwell. This is not uncommon, as chemotherapy can affect healthy cells as well as cancer cells. Report any side effects. Continue your course of treatment even though you feel ill unless your doctor tells you to stop. In some cases,  you may be given additional medicines to help with side effects. Follow all directions for their use. You may get drowsy or dizzy. Do not drive, use machinery, or do anything that needs mental alertness until you know how this medicine affects you. Do not stand or sit up quickly, especially if you are an older patient. This reduces the risk of dizzy or fainting spells. Call your health care professional for advice if you get a fever, chills, or sore throat, or other symptoms of a cold or flu. Do not treat yourself. This medicine decreases your body's ability to fight infections. Try to avoid being around people who are sick. Avoid taking products that contain aspirin, acetaminophen, ibuprofen, naproxen, or ketoprofen unless instructed by your doctor. These medicines may hide a fever. This medicine may increase your risk to bruise or bleed. Call your doctor or health care professional if you notice any unusual bleeding. Be careful brushing and flossing your teeth or using a toothpick because you may get an infection or bleed more easily. If you have any dental work done, tell your dentist you are receiving this medicine. Do not become pregnant while taking this medicine or for 6 months after stopping it. Women should inform their health care professional if they wish to become pregnant or think they might be pregnant. Men should not father a child while taking this medicine and for 3 months after stopping it. There is potential for serious side effects to an unborn child. Talk to your health care professional for more information. Do not breast-feed an infant while taking this medicine or for 7 days after stopping it. This medicine has caused ovarian failure in some women. This medicine may make it more difficult to get pregnant. Talk to your health care professional if you are concerned about your fertility. This medicine has caused decreased sperm counts in some men. This may make it more difficult to  father a child. Talk to your health care professional if you are concerned about your fertility. What side effects may I notice from receiving this medication? Side effects that you should report to your doctor or health care professional as soon as possible: allergic reactions like skin rash, itching or hives, swelling of the face, lips, or tongue chest pain diarrhea flushing, runny nose, sweating during infusion low blood counts - this medicine may decrease the number of white blood cells, red blood cells and platelets. You may be at increased risk for infections and bleeding. nausea, vomiting pain, swelling, warmth in the leg signs of decreased platelets or bleeding - bruising, pinpoint red spots on the skin, black, tarry stools, blood in the urine signs of infection - fever or chills, cough, sore throat, pain or difficulty passing urine signs of decreased red blood cells - unusually weak or tired, fainting spells, lightheadedness Side effects that usually do not require medical attention (report to your doctor or health care professional if they continue or are bothersome): constipation hair loss headache loss of appetite mouth sores stomach pain This list may not describe all possible side effects. Call your doctor for medical advice about side effects. You  may report side effects to FDA at 1-800-FDA-1088. Where should I keep my medication? This drug is given in a hospital or clinic and will not be stored at home. NOTE: This sheet is a summary. It may not cover all possible information. If you have questions about this medicine, talk to your doctor, pharmacist, or health care provider.  2022 [employer]/Gold Standard (2019-04-18 17:46:13) Leucovorin injection What is this medication? LEUCOVORIN (loo koe VOR in) is used to prevent or treat the harmful effects of some medicines. This medicine is used to treat anemia caused by a low amount of folic acid in the body. It is also used with  5-fluorouracil (5-FU) to treat colon cancer. This medicine may be used for other purposes; ask your health care provider or pharmacist if you have questions. What should I tell my care team before I take this medication? They need to know if you have any of these conditions: anemia from low levels of vitamin B-12 in the blood an unusual or allergic reaction to leucovorin, folic acid, other medicines, foods, dyes, or preservatives pregnant or trying to get pregnant breast-feeding How should I use this medication? This medicine is for injection into a muscle or into a vein. It is given by a health care professional in a hospital or clinic setting. Talk to your pediatrician regarding the use of this medicine in children. Special care may be needed. Overdosage: If you think you have taken too much of this medicine contact a poison control center or emergency room at once. NOTE: This medicine is only for you. Do not share this medicine with others. What if I miss a dose? This does not apply. What may interact with this medication? capecitabine fluorouracil phenobarbital phenytoin primidone trimethoprim-sulfamethoxazole This list may not describe all possible interactions. Give your health care provider a list of all the medicines, herbs, non-prescription drugs, or dietary supplements you use. Also tell them if you smoke, drink alcohol, or use illegal drugs. Some items may interact with your medicine. What should I watch for while using this medication? Your condition will be monitored carefully while you are receiving this medicine. This medicine may increase the side effects of 5-fluorouracil, 5-FU. Tell your doctor or health care professional if you have diarrhea or mouth sores that do not get better or that get worse. What side effects may I notice from receiving this medication? Side effects that you should report to your doctor or health care professional as soon as possible: allergic  reactions like skin rash, itching or hives, swelling of the face, lips, or tongue breathing problems fever, infection mouth sores unusual bleeding or bruising unusually weak or tired Side effects that usually do not require medical attention (report to your doctor or health care professional if they continue or are bothersome): constipation or diarrhea loss of appetite nausea, vomiting This list may not describe all possible side effects. Call your doctor for medical advice about side effects. You may report side effects to FDA at 1-800-FDA-1088. Where should I keep my medication? This drug is given in a hospital or clinic and will not be stored at home. NOTE: This sheet is a summary. It may not cover all possible information. If you have questions about this medicine, talk to your doctor, pharmacist, or health care provider.  2022 [employer] Standard (2007-11-22 16:50:29) Fluorouracil, 5-FU injection What is this medication? FLUOROURACIL, 5-FU (flure oh YOOR a sil) is a chemotherapy drug. It slows the growth of cancer cells. This medicine is used  to treat many types of cancer like breast cancer, colon or rectal cancer, pancreatic cancer, and stomach cancer. This medicine may be used for other purposes; ask your health care provider or pharmacist if you have questions. COMMON BRAND NAME(S): Adrucil What should I tell my care team before I take this medication? They need to know if you have any of these conditions: blood disorders dihydropyrimidine dehydrogenase (DPD) deficiency infection (especially a virus infection such as chickenpox, cold sores, or herpes) kidney disease liver disease malnourished, poor nutrition recent or ongoing radiation therapy an unusual or allergic reaction to fluorouracil, other chemotherapy, other medicines, foods, dyes, or preservatives pregnant or trying to get pregnant breast-feeding How should I use this medication? This drug is given as an  infusion or injection into a vein. It is administered in a hospital or clinic by a specially trained health care professional. Talk to your pediatrician regarding the use of this medicine in children. Special care may be needed. Overdosage: If you think you have taken too much of this medicine contact a poison control center or emergency room at once. NOTE: This medicine is only for you. Do not share this medicine with others. What if I miss a dose? It is important not to miss your dose. Call your doctor or health care professional if you are unable to keep an appointment. What may interact with this medication? Do not take this medicine with any of the following medications: live virus vaccines This medicine may also interact with the following medications: medicines that treat or prevent blood clots like warfarin, enoxaparin, and dalteparin This list may not describe all possible interactions. Give your health care provider a list of all the medicines, herbs, non-prescription drugs, or dietary supplements you use. Also tell them if you smoke, drink alcohol, or use illegal drugs. Some items may interact with your medicine. What should I watch for while using this medication? Visit your doctor for checks on your progress. This drug may make you feel generally unwell. This is not uncommon, as chemotherapy can affect healthy cells as well as cancer cells. Report any side effects. Continue your course of treatment even though you feel ill unless your doctor tells you to stop. In some cases, you may be given additional medicines to help with side effects. Follow all directions for their use. Call your doctor or health care professional for advice if you get a fever, chills or sore throat, or other symptoms of a cold or flu. Do not treat yourself. This drug decreases your body's ability to fight infections. Try to avoid being around people who are sick. This medicine may increase your risk to bruise or  bleed. Call your doctor or health care professional if you notice any unusual bleeding. Be careful brushing and flossing your teeth or using a toothpick because you may get an infection or bleed more easily. If you have any dental work done, tell your dentist you are receiving this medicine. Avoid taking products that contain aspirin, acetaminophen, ibuprofen, naproxen, or ketoprofen unless instructed by your doctor. These medicines may hide a fever. Do not become pregnant while taking this medicine. Women should inform their doctor if they wish to become pregnant or think they might be pregnant. There is a potential for serious side effects to an unborn child. Talk to your health care professional or pharmacist for more information. Do not breast-feed an infant while taking this medicine. Men should inform their doctor if they wish to father a child. This  medicine may lower sperm counts. Do not treat diarrhea with over the counter products. Contact your doctor if you have diarrhea that lasts more than 2 days or if it is severe and watery. This medicine can make you more sensitive to the sun. Keep out of the sun. If you cannot avoid being in the sun, wear protective clothing and use sunscreen. Do not use sun lamps or tanning beds/booths. What side effects may I notice from receiving this medication? Side effects that you should report to your doctor or health care professional as soon as possible: allergic reactions like skin rash, itching or hives, swelling of the face, lips, or tongue low blood counts - this medicine may decrease the number of white blood cells, red blood cells and platelets. You may be at increased risk for infections and bleeding. signs of infection - fever or chills, cough, sore throat, pain or difficulty passing urine signs of decreased platelets or bleeding - bruising, pinpoint red spots on the skin, black, tarry stools, blood in the urine signs of decreased red blood cells -  unusually weak or tired, fainting spells, lightheadedness breathing problems changes in vision chest pain mouth sores nausea and vomiting pain, swelling, redness at site where injected pain, tingling, numbness in the hands or feet redness, swelling, or sores on hands or feet stomach pain unusual bleeding Side effects that usually do not require medical attention (report to your doctor or health care professional if they continue or are bothersome): changes in finger or toe nails diarrhea dry or itchy skin hair loss headache loss of appetite sensitivity of eyes to the light stomach upset unusually teary eyes This list may not describe all possible side effects. Call your doctor for medical advice about side effects. You may report side effects to FDA at 1-800-FDA-1088. Where should I keep my medication? This drug is given in a hospital or clinic and will not be stored at home. NOTE: This sheet is a summary. It may not cover all possible information. If you have questions about this medicine, talk to your doctor, pharmacist, or health care provider.  2022 [employer] Standard (2019-04-18 15:00:03)

## 2021-04-01 NOTE — Addendum Note (Signed)
Addended by: Neysa Hotter on: 04/01/2021 08:24 AM   Modules accepted: Orders

## 2021-04-02 ENCOUNTER — Encounter: Payer: Self-pay | Admitting: Oncology

## 2021-04-03 ENCOUNTER — Inpatient Hospital Stay: Payer: PRIVATE HEALTH INSURANCE

## 2021-04-03 ENCOUNTER — Encounter: Payer: Self-pay | Admitting: Oncology

## 2021-04-03 ENCOUNTER — Other Ambulatory Visit: Payer: Self-pay

## 2021-04-03 VITALS — BP 117/69 | HR 117 | Temp 97.9°F | Resp 18 | Ht 68.0 in | Wt 189.0 lb

## 2021-04-03 DIAGNOSIS — Z5111 Encounter for antineoplastic chemotherapy: Secondary | ICD-10-CM | POA: Diagnosis not present

## 2021-04-03 DIAGNOSIS — C182 Malignant neoplasm of ascending colon: Secondary | ICD-10-CM

## 2021-04-03 MED ORDER — SODIUM CHLORIDE 0.9% FLUSH
10.0000 mL | INTRAVENOUS | Status: DC | PRN
  Administered 2021-04-03: 10 mL

## 2021-04-03 MED ORDER — HEPARIN SOD (PORK) LOCK FLUSH 100 UNIT/ML IV SOLN
500.0000 [IU] | Freq: Once | INTRAVENOUS | Status: AC | PRN
  Administered 2021-04-03: 500 [IU]

## 2021-04-03 NOTE — Patient Instructions (Signed)
Fluorouracil, 5-FU injection What is this medication? FLUOROURACIL, 5-FU (flure oh YOOR a sil) is a chemotherapy drug. It slows the growth of cancer cells. This medicine is used to treat many types of cancer like breast cancer, colon or rectal cancer, pancreatic cancer, and stomach cancer. This medicine may be used for other purposes; ask your health care provider or pharmacist if you have questions. COMMON BRAND NAME(S): Adrucil What should I tell my care team before I take this medication? They need to know if you have any of these conditions: blood disorders dihydropyrimidine dehydrogenase (DPD) deficiency infection (especially a virus infection such as chickenpox, cold sores, or herpes) kidney disease liver disease malnourished, poor nutrition recent or ongoing radiation therapy an unusual or allergic reaction to fluorouracil, other chemotherapy, other medicines, foods, dyes, or preservatives pregnant or trying to get pregnant breast-feeding How should I use this medication? This drug is given as an infusion or injection into a vein. It is administered in a hospital or clinic by a specially trained health care professional. Talk to your pediatrician regarding the use of this medicine in children. Special care may be needed. Overdosage: If you think you have taken too much of this medicine contact a poison control center or emergency room at once. NOTE: This medicine is only for you. Do not share this medicine with others. What if I miss a dose? It is important not to miss your dose. Call your doctor or health care professional if you are unable to keep an appointment. What may interact with this medication? Do not take this medicine with any of the following medications: live virus vaccines This medicine may also interact with the following medications: medicines that treat or prevent blood clots like warfarin, enoxaparin, and dalteparin This list may not describe all possible  interactions. Give your health care provider a list of all the medicines, herbs, non-prescription drugs, or dietary supplements you use. Also tell them if you smoke, drink alcohol, or use illegal drugs. Some items may interact with your medicine. What should I watch for while using this medication? Visit your doctor for checks on your progress. This drug may make you feel generally unwell. This is not uncommon, as chemotherapy can affect healthy cells as well as cancer cells. Report any side effects. Continue your course of treatment even though you feel ill unless your doctor tells you to stop. In some cases, you may be given additional medicines to help with side effects. Follow all directions for their use. Call your doctor or health care professional for advice if you get a fever, chills or sore throat, or other symptoms of a cold or flu. Do not treat yourself. This drug decreases your body's ability to fight infections. Try to avoid being around people who are sick. This medicine may increase your risk to bruise or bleed. Call your doctor or health care professional if you notice any unusual bleeding. Be careful brushing and flossing your teeth or using a toothpick because you may get an infection or bleed more easily. If you have any dental work done, tell your dentist you are receiving this medicine. Avoid taking products that contain aspirin, acetaminophen, ibuprofen, naproxen, or ketoprofen unless instructed by your doctor. These medicines may hide a fever. Do not become pregnant while taking this medicine. Women should inform their doctor if they wish to become pregnant or think they might be pregnant. There is a potential for serious side effects to an unborn child. Talk to your health care   professional or pharmacist for more information. Do not breast-feed an infant while taking this medicine. Men should inform their doctor if they wish to father a child. This medicine may lower sperm  counts. Do not treat diarrhea with over the counter products. Contact your doctor if you have diarrhea that lasts more than 2 days or if it is severe and watery. This medicine can make you more sensitive to the sun. Keep out of the sun. If you cannot avoid being in the sun, wear protective clothing and use sunscreen. Do not use sun lamps or tanning beds/booths. What side effects may I notice from receiving this medication? Side effects that you should report to your doctor or health care professional as soon as possible: allergic reactions like skin rash, itching or hives, swelling of the face, lips, or tongue low blood counts - this medicine may decrease the number of white blood cells, red blood cells and platelets. You may be at increased risk for infections and bleeding. signs of infection - fever or chills, cough, sore throat, pain or difficulty passing urine signs of decreased platelets or bleeding - bruising, pinpoint red spots on the skin, black, tarry stools, blood in the urine signs of decreased red blood cells - unusually weak or tired, fainting spells, lightheadedness breathing problems changes in vision chest pain mouth sores nausea and vomiting pain, swelling, redness at site where injected pain, tingling, numbness in the hands or feet redness, swelling, or sores on hands or feet stomach pain unusual bleeding Side effects that usually do not require medical attention (report to your doctor or health care professional if they continue or are bothersome): changes in finger or toe nails diarrhea dry or itchy skin hair loss headache loss of appetite sensitivity of eyes to the light stomach upset unusually teary eyes This list may not describe all possible side effects. Call your doctor for medical advice about side effects. You may report side effects to FDA at 1-800-FDA-1088. Where should I keep my medication? This drug is given in a hospital or clinic and will not be  stored at home. NOTE: This sheet is a summary. It may not cover all possible information. If you have questions about this medicine, talk to your doctor, pharmacist, or health care provider.  2022 Elsevier/Gold Standard (2019-04-18 15:00:03)  

## 2021-04-03 NOTE — Progress Notes (Signed)
PT DECLINED IVF FLUIDS TODAY, NO COMPLAINTS, STATES NO PROBLEMS WITH CHEMO.  CHEMO PUMP DISCONTINUED. DR LEWIS NOTIFIED

## 2021-04-07 NOTE — Progress Notes (Signed)
[facility]  [address] [place],  [place]  [address] [phone]  Clinic Day:  04/14/2021  Referring physician: Greig Right, MD  This document serves as a record of services personally performed by Shalynn Jorstad Macarthur Critchley, MD. It was created on their behalf by [facility] E, a trained medical scribe. The creation of this record is based on the scribe's personal observations and the provider's statements to them.  HISTORY OF PRESENT ILLNESS:  The patient is a 55 y.o. female with metastatic colon cancer, which includes spread of disease to her abdominal cavity, lungs, brain and right adrenal gland. She comes in today prior to go over her CT scans to ascertain her new disease baseline after 18 cycles of FOLFIRI/Avastin.  The patient claims to have toleraed her 18th cycle of treatment fairly well.  Her diarrhea remains prominent despite receiving atropine to offset the effects of irinotecan, whose dose has been decreased over time.  She denies having any new symptoms/findings which concern her for disease progression.  Overall, she remains in good spirits.     With respect to her colon cancer history, she is status post a right hemicolectomy in early October 2018, followed by 12 cycles of adjuvant chemotherapy, which were completed in April 2019.  In December 2021, she underwent a left cerebellar metastatectomy, whose pathology was consistent with metastatic colon cancer.  Tumor testing did come back MMR normal.  CT scans recently showed evidence of her cancer being in multiple locations, which has her on palliative chemotherapy at this time.    PHYSICAL EXAM:  Blood pressure 101/72, pulse 97, temperature (!) 97.5 F (36.4 C), resp. rate 16, height 5' 8"  (1.727 m), weight 186 lb 1.6 oz (84.4 kg), last menstrual period 02/22/2019, SpO2 97 %. Wt Readings from Last 3 Encounters:  04/14/21 186 lb 1.6 oz (84.4 kg)  04/10/21 185 lb (83.9 kg)  04/03/21 189 lb (85.7 kg)    Body mass index is 28.3 kg/m. Performance status (ECOG): 1 Physical Exam Constitutional:      General: She is not in acute distress.    Appearance: Normal appearance. She is normal weight. She is not ill-appearing.  HENT:     Head: Normocephalic and atraumatic.  Eyes:     General: No scleral icterus.    Extraocular Movements: Extraocular movements intact.     Conjunctiva/sclera: Conjunctivae normal.     Pupils: Pupils are equal, round, and reactive to light.  Cardiovascular:     Rate and Rhythm: Regular rhythm. Tachycardia present.     Pulses: Normal pulses.     Heart sounds: Normal heart sounds. No murmur heard.   No friction rub. No gallop.  Pulmonary:     Effort: Pulmonary effort is normal. No respiratory distress.     Breath sounds: Normal breath sounds.  Abdominal:     General: Bowel sounds are normal. There is no distension.     Palpations: Abdomen is soft. There is no hepatomegaly, splenomegaly or mass.     Tenderness: There is no abdominal tenderness.  Musculoskeletal:        General: Normal range of motion.     Cervical back: Normal range of motion and neck supple.     Right lower leg: No edema.     Left lower leg: No edema.  Lymphadenopathy:     Cervical: No cervical adenopathy.  Skin:    General: Skin is warm and dry.  Neurological:     General: No focal deficit  present.     Mental Status: She is alert and oriented to person, place, and time. Mental status is at baseline.  Psychiatric:        Mood and Affect: Mood normal.        Behavior: Behavior normal.        Thought Content: Thought content normal.        Judgment: Judgment normal.   SCANS: Recent CT imaging has revealed the following:  FINDINGS: CT CHEST FINDINGS Cardiovascular: Accessed right chest Port-A-Cath with tip at the superior cavoatrial junction. Normal size heart. No significant pericardial effusion/thickening. No central pulmonary embolus on this nondedicated study. No thoracic aortic  aneurysm. Mediastinum/Nodes: No discrete thyroid nodule. No supraclavicular adenopathy. No pathologically enlarged mediastinal, hilar or axillary lymph nodes visualized. Lungs/Pleura: Spiculated posterior segment right upper lobe pulmonary nodule measures in size measuring 2.0 x 1.3 cm on image 34/4 previously 2.0 x 1.4 cm. Spiculated nodule in the lateral right lower lobe measures 11 x 8 mm on image 62/4, unchanged. No new suspicious pulmonary nodules or masses. Minimal lingular scarring is unchanged. No pleural effusion. No pneumothorax. Musculoskeletal: Multilevel degenerative changes spine. No aggressive lytic or blastic lesion of bone.  CT ABDOMEN PELVIS FINDINGS Hepatobiliary: Diffuse hepatic steatosis with peripheral areas of focal fatty sparing. No suspicious hepatic lesion. Gallbladder is unremarkable. No biliary ductal dilation. Pancreas: No pancreatic ductal dilation or evidence of acute inflammation. Spleen: Within normal limits. Adrenals/Urinary Tract: Bilateral adrenal glands are unremarkable. Hypodense bilateral subcentimeter renal lesions are too small to accurately characterize but statistically likely reflect cysts. No solid enhancing renal mass. No hydronephrosis. Urinary bladder is unremarkable for degree of distension. Stomach/Bowel: Prior right hemicolectomy. Radiopaque enteric contrast traverses the rectum. Stomach is unremarkable. No pathologic dilation of small or large bowel. No evidence of acute bowel inflammation. Vascular/Lymphatic: Aortic atherosclerosis without aneurysmal dilation. No pathologically enlarged abdominal or pelvic lymph nodes. Reproductive: Status post hysterectomy. No adnexal masses. Other: Indexed small bowel mesenteric nodules measure 11 x 8 mm on image 76/2 previously 12 x 7 mm and 13 x 13 mm on image 68/2 previously 14 x 14 mm. No new discrete suspicious peritoneal nodularity. No abdominopelvic ascites. Small fat containing  supraumbilical incisional hernia. Laxity of the ventral abdominal wall at the umbilicus and below. Musculoskeletal: Multilevel degenerative changes spine. No aggressive lytic or blastic lesion of bone.  IMPRESSION: 1. Stable metastatic disease in the chest and small bowel mesentery. No findings of new or progressive disease in the chest, abdomen or pelvis 2. Diffuse hepatic steatosis. 3. Aortic Atherosclerosis (ICD10-I70.0).  LABS:    Ref. Range 04/14/2021 00:00  WBC Unknown 2.9  RBC Latest Ref Range: 3.87 - 5.11  3.67 (A)  Hemoglobin Latest Ref Range: 12.0 - 16.0  12.0  HCT Latest Ref Range: 36 - 46  37  MCV Latest Ref Range: 81 - 99  101 (A)  Platelets Latest Ref Range: 150 - 399  118 (A)  NEUT# Unknown 1.25     ASSESSMENT & PLAN:  Assessment/Plan:  A 54 y.o. female with metastatic colon cancer.  In clinic today, I went over her CT scan images with her, for which she could see that she has no new disease.  Furthermore, the lesions seen are very small and pose no threat to her morbidity/mortality.  Clinically, she continues to do very well.  She is contemplating spacing her chemotherapy out to every 3 weeks, but wishes to keep her chemotherapy cycles on a biweekly schedule.  She brings to  my attention that she is scheduled to undergo a repeat brain MRI in the forthcoming days/weeks.  She will proceed with her 19th cycle of FOLFIRI/Avastin this week.  I will see her back in 2 weeks before she heads into her 20th cycle of FOLFIRI/Avastin. The patient understands all the plans discussed today and is in agreement with them.     I, Rita Ohara, am acting as scribe for Marice Potter, MD    I have reviewed this report as typed by the medical scribe, and it is complete and accurate.  Ioane Bhola Macarthur Critchley, MD

## 2021-04-08 ENCOUNTER — Telehealth: Payer: Self-pay | Admitting: Oncology

## 2021-04-08 NOTE — Telephone Encounter (Signed)
Patient to have Labs Drawn on 11/10 before CT on 11/14.  Scheduled patient's CT Scan 11/14 to check in at [facility] 8:15 am  (she will need to pick up Contrast/Instructions before then).  Scheduling Follow Up Appt at 12:00 pm

## 2021-04-09 ENCOUNTER — Telehealth: Payer: Self-pay | Admitting: Oncology

## 2021-04-09 NOTE — Telephone Encounter (Signed)
Patient called to go over her next Appt's

## 2021-04-10 ENCOUNTER — Inpatient Hospital Stay: Payer: PRIVATE HEALTH INSURANCE

## 2021-04-10 ENCOUNTER — Other Ambulatory Visit: Payer: Self-pay

## 2021-04-10 ENCOUNTER — Encounter: Payer: Self-pay | Admitting: Hematology and Oncology

## 2021-04-10 VITALS — BP 138/86 | HR 98 | Temp 97.8°F | Resp 20 | Ht 68.0 in | Wt 185.0 lb

## 2021-04-10 DIAGNOSIS — C182 Malignant neoplasm of ascending colon: Secondary | ICD-10-CM

## 2021-04-10 DIAGNOSIS — Z5111 Encounter for antineoplastic chemotherapy: Secondary | ICD-10-CM | POA: Diagnosis not present

## 2021-04-10 LAB — HEPATIC FUNCTION PANEL
ALT: 33 (ref 7–35)
AST: 47 — AB (ref 13–35)
Alkaline Phosphatase: 163 — AB (ref 25–125)
Bilirubin, Total: 0.5

## 2021-04-10 LAB — CBC AND DIFFERENTIAL
HCT: 35 — AB (ref 36–46)
Hemoglobin: 11.6 — AB (ref 12.0–16.0)
Neutrophils Absolute: 0.59
Platelets: 82 — AB (ref 150–399)
WBC: 1.8

## 2021-04-10 LAB — BASIC METABOLIC PANEL
BUN: 12 (ref 4–21)
CO2: 20 (ref 13–22)
Chloride: 108 (ref 99–108)
Creatinine: 1.4 — AB (ref 0.5–1.1)
Glucose: 156
Potassium: 3.6 (ref 3.4–5.3)
Sodium: 140 (ref 137–147)

## 2021-04-10 LAB — CBC: RBC: 3.52 — AB (ref 3.87–5.11)

## 2021-04-10 LAB — TOTAL PROTEIN, URINE DIPSTICK: Protein, ur: 30 mg/dL — AB

## 2021-04-10 LAB — COMPREHENSIVE METABOLIC PANEL
Albumin: 4.3 (ref 3.5–5.0)
Calcium: 8.8 (ref 8.7–10.7)

## 2021-04-10 MED ORDER — FILGRASTIM-SNDZ 480 MCG/0.8ML IJ SOSY
480.0000 ug | PREFILLED_SYRINGE | Freq: Once | INTRAMUSCULAR | Status: AC
  Administered 2021-04-10: 480 ug via SUBCUTANEOUS
  Filled 2021-04-10: qty 0.8

## 2021-04-11 ENCOUNTER — Other Ambulatory Visit: Payer: No Typology Code available for payment source

## 2021-04-11 ENCOUNTER — Inpatient Hospital Stay: Payer: PRIVATE HEALTH INSURANCE

## 2021-04-11 DIAGNOSIS — Z5111 Encounter for antineoplastic chemotherapy: Secondary | ICD-10-CM | POA: Diagnosis not present

## 2021-04-11 DIAGNOSIS — C182 Malignant neoplasm of ascending colon: Secondary | ICD-10-CM

## 2021-04-11 LAB — CEA: CEA: 5.5 ng/mL — ABNORMAL HIGH (ref 0.0–4.7)

## 2021-04-11 MED ORDER — FILGRASTIM-SNDZ 480 MCG/0.8ML IJ SOSY
480.0000 ug | PREFILLED_SYRINGE | Freq: Once | INTRAMUSCULAR | Status: AC
  Administered 2021-04-11: 480 ug via SUBCUTANEOUS
  Filled 2021-04-11: qty 0.8

## 2021-04-11 NOTE — Patient Instructions (Signed)
Filgrastim, G-CSF injection °What is this medication? °FILGRASTIM, G-CSF (fil GRA stim) is a granulocyte colony-stimulating factor that stimulates the growth of neutrophils, a type of white blood cell (WBC) important in the body's fight against infection. It is used to reduce the incidence of fever and infection in patients with certain types of cancer who are receiving chemotherapy that affects the bone marrow, to stimulate blood cell production for removal of WBCs from the body prior to a bone marrow transplantation, to reduce the incidence of fever and infection in patients who have severe chronic neutropenia, and to improve survival outcomes following high-dose radiation exposure that is toxic to the bone marrow. °This medicine may be used for other purposes; ask your health care provider or pharmacist if you have questions. °COMMON BRAND NAME(S): Neupogen, Nivestym, Releuko, Zarxio °What should I tell my care team before I take this medication? °They need to know if you have any of these conditions: °kidney disease °latex allergy °ongoing radiation therapy °sickle cell disease °an unusual or allergic reaction to filgrastim, pegfilgrastim, other medicines, foods, dyes, or preservatives °pregnant or trying to get pregnant °breast-feeding °How should I use this medication? °This medicine is for injection under the skin or infusion into a vein. As an infusion into a vein, it is usually given by a health care professional in a hospital or clinic setting. If you get this medicine at home, you will be taught how to prepare and give this medicine. Refer to the Instructions for Use that come with your medication packaging. Use exactly as directed. Take your medicine at regular intervals. Do not take your medicine more often than directed. °It is important that you put your used needles and syringes in a special sharps container. Do not put them in a trash can. If you do not have a sharps container, call your pharmacist  or healthcare provider to get one. °Talk to your pediatrician regarding the use of this medicine in children. While this drug may be prescribed for children as young as 7 months for selected conditions, precautions do apply. °Overdosage: If you think you have taken too much of this medicine contact a poison control center or emergency room at once. °NOTE: This medicine is only for you. Do not share this medicine with others. °What if I miss a dose? °It is important not to miss your dose. Call your doctor or health care professional if you miss a dose. °What may interact with this medication? °This medicine may interact with the following medications: °medicines that may cause a release of neutrophils, such as lithium °This list may not describe all possible interactions. Give your health care provider a list of all the medicines, herbs, non-prescription drugs, or dietary supplements you use. Also tell them if you smoke, drink alcohol, or use illegal drugs. Some items may interact with your medicine. °What should I watch for while using this medication? °Your condition will be monitored carefully while you are receiving this medicine. °You may need blood work done while you are taking this medicine. °Talk to your health care provider about your risk of cancer. You may be more at risk for certain types of cancer if you take this medicine. °What side effects may I notice from receiving this medication? °Side effects that you should report to your doctor or health care professional as soon as possible: °allergic reactions like skin rash, itching or hives, swelling of the face, lips, or tongue °back pain °dizziness or feeling faint °fever °pain, redness, or   irritation at site where injected °pinpoint red spots on the skin °shortness of breath or breathing problems °signs and symptoms of kidney injury like trouble passing urine, change in the amount of urine, or red or dark-brown urine °stomach or side pain, or pain at  the shoulder °swelling °tiredness °unusual bleeding or bruising °Side effects that usually do not require medical attention (report to your doctor or health care professional if they continue or are bothersome): °bone pain °cough °diarrhea °hair loss °headache °muscle pain °This list may not describe all possible side effects. Call your doctor for medical advice about side effects. You may report side effects to FDA at 1-800-FDA-1088. °Where should I keep my medication? °Keep out of the reach of children. °Store in a refrigerator between 2 and 8 degrees C (36 and 46 degrees F). Do not freeze. Keep in carton to protect from light. Throw away this medicine if vials or syringes are left out of the refrigerator for more than 24 hours. Throw away any unused medicine after the expiration date. °NOTE: This sheet is a summary. It may not cover all possible information. If you have questions about this medicine, talk to your doctor, pharmacist, or health care provider. °© 2022 [employer] (2021-02-04 00:00:00) ° °

## 2021-04-14 ENCOUNTER — Telehealth: Payer: Self-pay | Admitting: Oncology

## 2021-04-14 ENCOUNTER — Inpatient Hospital Stay (INDEPENDENT_AMBULATORY_CARE_PROVIDER_SITE_OTHER): Payer: PRIVATE HEALTH INSURANCE | Admitting: Oncology

## 2021-04-14 ENCOUNTER — Other Ambulatory Visit: Payer: No Typology Code available for payment source

## 2021-04-14 ENCOUNTER — Inpatient Hospital Stay: Payer: PRIVATE HEALTH INSURANCE

## 2021-04-14 ENCOUNTER — Other Ambulatory Visit: Payer: Self-pay

## 2021-04-14 ENCOUNTER — Encounter: Payer: Self-pay | Admitting: Oncology

## 2021-04-14 ENCOUNTER — Other Ambulatory Visit: Payer: Self-pay | Admitting: Hematology and Oncology

## 2021-04-14 VITALS — BP 101/72 | HR 97 | Temp 97.5°F | Resp 16 | Ht 68.0 in | Wt 186.1 lb

## 2021-04-14 DIAGNOSIS — C182 Malignant neoplasm of ascending colon: Secondary | ICD-10-CM

## 2021-04-14 LAB — CBC AND DIFFERENTIAL
HCT: 37 (ref 36–46)
Hemoglobin: 12 (ref 12.0–16.0)
Neutrophils Absolute: 1.25
Platelets: 118 — AB (ref 150–399)
WBC: 2.9

## 2021-04-14 LAB — CBC
MCV: 101 — AB (ref 81–99)
RBC: 3.67 — AB (ref 3.87–5.11)

## 2021-04-14 MED FILL — Leucovorin Calcium For Inj 350 MG: INTRAMUSCULAR | Qty: 41.8 | Status: AC

## 2021-04-14 MED FILL — Irinotecan HCl Inj 100 MG/5ML (20 MG/ML): INTRAVENOUS | Qty: 14 | Status: AC

## 2021-04-14 MED FILL — Fluorouracil IV Soln 5 GM/100ML (50 MG/ML): INTRAVENOUS | Qty: 100 | Status: AC

## 2021-04-14 MED FILL — Fluorouracil IV Soln 2.5 GM/50ML (50 MG/ML): INTRAVENOUS | Qty: 17 | Status: AC

## 2021-04-14 MED FILL — Bevacizumab-bvzr IV Soln 400 MG/16ML (For Infusion): INTRAVENOUS | Qty: 18 | Status: AC

## 2021-04-14 NOTE — Progress Notes (Signed)
Proceed with Bev/FOLFIRI despite ANC=1160 per Dr. Bobby Rumpf.  Pt will receive zarxio to help decrease her risk of febrile neutropenia.

## 2021-04-14 NOTE — Telephone Encounter (Signed)
Per 11/14 los next apt scheduled and given to patient

## 2021-04-15 ENCOUNTER — Inpatient Hospital Stay: Payer: PRIVATE HEALTH INSURANCE

## 2021-04-15 VITALS — BP 113/87 | HR 113 | Temp 98.8°F | Resp 15 | Wt 187.0 lb

## 2021-04-15 DIAGNOSIS — Z5111 Encounter for antineoplastic chemotherapy: Secondary | ICD-10-CM | POA: Diagnosis not present

## 2021-04-15 DIAGNOSIS — C182 Malignant neoplasm of ascending colon: Secondary | ICD-10-CM

## 2021-04-15 MED ORDER — SODIUM CHLORIDE 0.9 % IV SOLN
10.0000 mg | Freq: Once | INTRAVENOUS | Status: AC
  Administered 2021-04-15: 10 mg via INTRAVENOUS
  Filled 2021-04-15: qty 1

## 2021-04-15 MED ORDER — SODIUM CHLORIDE 0.9 % IV SOLN
2400.0000 mg/m2 | INTRAVENOUS | Status: DC
  Administered 2021-04-15: 5000 mg via INTRAVENOUS
  Filled 2021-04-15: qty 100

## 2021-04-15 MED ORDER — SODIUM CHLORIDE 0.9 % IV SOLN: Freq: Once | INTRAVENOUS | Status: AC

## 2021-04-15 MED ORDER — FLUOROURACIL CHEMO INJECTION 2.5 GM/50ML
400.0000 mg/m2 | Freq: Once | INTRAVENOUS | Status: AC
  Administered 2021-04-15: 850 mg via INTRAVENOUS
  Filled 2021-04-15: qty 17

## 2021-04-15 MED ORDER — SODIUM CHLORIDE 0.9 % IV SOLN
150.0000 mg | Freq: Once | INTRAVENOUS | Status: AC
  Administered 2021-04-15: 150 mg via INTRAVENOUS
  Filled 2021-04-15: qty 5

## 2021-04-15 MED ORDER — HEPARIN SOD (PORK) LOCK FLUSH 100 UNIT/ML IV SOLN: 500.0000 [IU] | Freq: Once | INTRAVENOUS | Status: DC | PRN

## 2021-04-15 MED ORDER — SODIUM CHLORIDE 0.9 % IV SOLN
Freq: Once | INTRAVENOUS | Status: AC
Start: 2021-04-15 — End: 2021-04-15

## 2021-04-15 MED ORDER — LEUCOVORIN CALCIUM INJECTION 350 MG
400.0000 mg/m2 | Freq: Once | INTRAVENOUS | Status: AC
  Administered 2021-04-15: 836 mg via INTRAVENOUS
  Filled 2021-04-15: qty 41.8

## 2021-04-15 MED ORDER — SODIUM CHLORIDE 0.9 % IV SOLN
5.0000 mg/kg | Freq: Once | INTRAVENOUS | Status: AC
  Administered 2021-04-15: 450 mg via INTRAVENOUS
  Filled 2021-04-15: qty 16

## 2021-04-15 MED ORDER — PALONOSETRON HCL INJECTION 0.25 MG/5ML
0.2500 mg | Freq: Once | INTRAVENOUS | Status: AC
  Administered 2021-04-15: 0.25 mg via INTRAVENOUS
  Filled 2021-04-15: qty 5

## 2021-04-15 MED ORDER — SODIUM CHLORIDE 0.9% FLUSH: 10.0000 mL | INTRAVENOUS | Status: DC | PRN

## 2021-04-15 MED ORDER — IRINOTECAN HCL CHEMO INJECTION 100 MG/5ML
135.0000 mg/m2 | Freq: Once | INTRAVENOUS | Status: AC
  Administered 2021-04-15: 280 mg via INTRAVENOUS
  Filled 2021-04-15: qty 10

## 2021-04-15 MED ORDER — ATROPINE SULFATE 1 MG/ML IV SOLN
0.5000 mg | Freq: Once | INTRAVENOUS | Status: AC | PRN
  Administered 2021-04-15: 0.5 mg via INTRAVENOUS
  Filled 2021-04-15: qty 1

## 2021-04-15 MED ORDER — SODIUM CHLORIDE 0.9 % IV SOLN: Freq: Once | INTRAVENOUS | Status: DC

## 2021-04-15 NOTE — Patient Instructions (Signed)
[facility]  Discharge Instructions: Thank you for choosing [facility] to provide your oncology and hematology care.  If you have a lab appointment with the [facility], please go directly to the [facility] and check in at the registration area.   Wear comfortable clothing and clothing appropriate for easy access to any Portacath or PICC line.   We strive to give you quality time with your provider. You may need to reschedule your appointment if you arrive late (15 or more minutes).  Arriving late affects you and other patients whose appointments are after yours.  Also, if you miss three or more appointments without notifying the office, you may be dismissed from the clinic at the provider's discretion.      For prescription refill requests, have your pharmacy contact our office and allow 72 hours for refills to be completed.    Today you received the following chemotherapy and/or immunotherapy agents:5FU, Bevacizumab, Irinotecan and Leucovorin    To help prevent nausea and vomiting after your treatment, we encourage you to take your nausea medication as directed.  BELOW ARE SYMPTOMS THAT SHOULD BE REPORTED IMMEDIATELY: *FEVER GREATER THAN 100.4 F (38 C) OR HIGHER *CHILLS OR SWEATING *NAUSEA AND VOMITING THAT IS NOT CONTROLLED WITH YOUR NAUSEA MEDICATION *UNUSUAL SHORTNESS OF BREATH *UNUSUAL BRUISING OR BLEEDING *URINARY PROBLEMS (pain or burning when urinating, or frequent urination) *BOWEL PROBLEMS (unusual diarrhea, constipation, pain near the anus) TENDERNESS IN MOUTH AND THROAT WITH OR WITHOUT PRESENCE OF ULCERS (sore throat, sores in mouth, or a toothache) UNUSUAL RASH, SWELLING OR PAIN  UNUSUAL VAGINAL DISCHARGE OR ITCHING   Items with * indicate a potential emergency and should be followed up as soon as possible or go to the Emergency Department if any problems should occur.  Please show the CHEMOTHERAPY ALERT CARD or IMMUNOTHERAPY  ALERT CARD at check-in to the Emergency Department and triage nurse.  Should you have questions after your visit or need to cancel or reschedule your appointment, please contact [facility]  Dept: [phone]  and follow the prompts.  Office hours are 8:00 a.m. to 4:30 p.m. Monday - Friday. Please note that voicemails left after 4:00 p.m. may not be returned until the following business day.  We are closed weekends and major holidays. You have access to a nurse at all times for urgent questions. Please call the main number to the clinic Dept: [phone] and follow the prompts.  For any non-urgent questions, you may also contact your provider using MyChart. We now offer e-Visits for anyone 66 and older to request care online for non-urgent symptoms. For details visit [email or web address].   Also download the MyChart app! Go to the app store, search "MyChart", open the app, select [facility], and log in with your MyChart username and password.  Due to Covid, a mask is required upon entering the hospital/clinic. If you do not have a mask, one will be given to you upon arrival. For doctor visits, patients may have 1 support person aged 73 or older with them. For treatment visits, patients cannot have anyone with them due to current Covid guidelines and our immunocompromised population.   Fluorouracil, 5-FU injection What is this medication? FLUOROURACIL, 5-FU (flure oh YOOR a sil) is a chemotherapy drug. It slows the growth of cancer cells. This medicine is used to treat many types of cancer like breast cancer, colon or rectal cancer, pancreatic cancer, and stomach cancer. This medicine may  be used for other purposes; ask your health care provider or pharmacist if you have questions. COMMON BRAND NAME(S): Adrucil What should I tell my care team before I take this medication? They need to know if you have any of these conditions: blood disorders dihydropyrimidine  dehydrogenase (DPD) deficiency infection (especially a virus infection such as chickenpox, cold sores, or herpes) kidney disease liver disease malnourished, poor nutrition recent or ongoing radiation therapy an unusual or allergic reaction to fluorouracil, other chemotherapy, other medicines, foods, dyes, or preservatives pregnant or trying to get pregnant breast-feeding How should I use this medication? This drug is given as an infusion or injection into a vein. It is administered in a hospital or clinic by a specially trained health care professional. Talk to your pediatrician regarding the use of this medicine in children. Special care may be needed. Overdosage: If you think you have taken too much of this medicine contact a poison control center or emergency room at once. NOTE: This medicine is only for you. Do not share this medicine with others. What if I miss a dose? It is important not to miss your dose. Call your doctor or health care professional if you are unable to keep an appointment. What may interact with this medication? Do not take this medicine with any of the following medications: live virus vaccines This medicine may also interact with the following medications: medicines that treat or prevent blood clots like warfarin, enoxaparin, and dalteparin This list may not describe all possible interactions. Give your health care provider a list of all the medicines, herbs, non-prescription drugs, or dietary supplements you use. Also tell them if you smoke, drink alcohol, or use illegal drugs. Some items may interact with your medicine. What should I watch for while using this medication? Visit your doctor for checks on your progress. This drug may make you feel generally unwell. This is not uncommon, as chemotherapy can affect healthy cells as well as cancer cells. Report any side effects. Continue your course of treatment even though you feel ill unless your doctor tells you to  stop. In some cases, you may be given additional medicines to help with side effects. Follow all directions for their use. Call your doctor or health care professional for advice if you get a fever, chills or sore throat, or other symptoms of a cold or flu. Do not treat yourself. This drug decreases your body's ability to fight infections. Try to avoid being around people who are sick. This medicine may increase your risk to bruise or bleed. Call your doctor or health care professional if you notice any unusual bleeding. Be careful brushing and flossing your teeth or using a toothpick because you may get an infection or bleed more easily. If you have any dental work done, tell your dentist you are receiving this medicine. Avoid taking products that contain aspirin, acetaminophen, ibuprofen, naproxen, or ketoprofen unless instructed by your doctor. These medicines may hide a fever. Do not become pregnant while taking this medicine. Women should inform their doctor if they wish to become pregnant or think they might be pregnant. There is a potential for serious side effects to an unborn child. Talk to your health care professional or pharmacist for more information. Do not breast-feed an infant while taking this medicine. Men should inform their doctor if they wish to father a child. This medicine may lower sperm counts. Do not treat diarrhea with over the counter products. Contact your doctor if you have diarrhea  that lasts more than 2 days or if it is severe and watery. This medicine can make you more sensitive to the sun. Keep out of the sun. If you cannot avoid being in the sun, wear protective clothing and use sunscreen. Do not use sun lamps or tanning beds/booths. What side effects may I notice from receiving this medication? Side effects that you should report to your doctor or health care professional as soon as possible: allergic reactions like skin rash, itching or hives, swelling of the face,  lips, or tongue low blood counts - this medicine may decrease the number of white blood cells, red blood cells and platelets. You may be at increased risk for infections and bleeding. signs of infection - fever or chills, cough, sore throat, pain or difficulty passing urine signs of decreased platelets or bleeding - bruising, pinpoint red spots on the skin, black, tarry stools, blood in the urine signs of decreased red blood cells - unusually weak or tired, fainting spells, lightheadedness breathing problems changes in vision chest pain mouth sores nausea and vomiting pain, swelling, redness at site where injected pain, tingling, numbness in the hands or feet redness, swelling, or sores on hands or feet stomach pain unusual bleeding Side effects that usually do not require medical attention (report to your doctor or health care professional if they continue or are bothersome): changes in finger or toe nails diarrhea dry or itchy skin hair loss headache loss of appetite sensitivity of eyes to the light stomach upset unusually teary eyes This list may not describe all possible side effects. Call your doctor for medical advice about side effects. You may report side effects to FDA at 1-800-FDA-1088. Where should I keep my medication? This drug is given in a hospital or clinic and will not be stored at home. NOTE: This sheet is a summary. It may not cover all possible information. If you have questions about this medicine, talk to your doctor, pharmacist, or health care provider.  2022 Elsevier/Gold Standard (2021-02-04 00:00:00) Irinotecan injection What is this medication? IRINOTECAN (ir in oh TEE kan ) is a chemotherapy drug. It is used to treat colon and rectal cancer. This medicine may be used for other purposes; ask your health care provider or pharmacist if you have questions. COMMON BRAND NAME(S): Camptosar What should I tell my care team before I take this medication? They  need to know if you have any of these conditions: dehydration diarrhea infection (especially a virus infection such as chickenpox, cold sores, or herpes) liver disease low blood counts, like low white cell, platelet, or red cell counts low levels of calcium, magnesium, or potassium in the blood recent or ongoing radiation therapy an unusual or allergic reaction to irinotecan, other medicines, foods, dyes, or preservatives pregnant or trying to get pregnant breast-feeding How should I use this medication? This drug is given as an infusion into a vein. It is administered in a hospital or clinic by a specially trained health care professional. Talk to your pediatrician regarding the use of this medicine in children. Special care may be needed. Overdosage: If you think you have taken too much of this medicine contact a poison control center or emergency room at once. NOTE: This medicine is only for you. Do not share this medicine with others. What if I miss a dose? It is important not to miss your dose. Call your doctor or health care professional if you are unable to keep an appointment. What may interact with this  medication? Do not take this medicine with any of the following medications: cobicistat itraconazole This medicine may interact with the following medications: antiviral medicines for HIV or AIDS certain antibiotics like rifampin or rifabutin certain medicines for fungal infections like ketoconazole, posaconazole, and voriconazole certain medicines for seizures like carbamazepine, phenobarbital, phenotoin clarithromycin gemfibrozil nefazodone St. John's Wort This list may not describe all possible interactions. Give your health care provider a list of all the medicines, herbs, non-prescription drugs, or dietary supplements you use. Also tell them if you smoke, drink alcohol, or use illegal drugs. Some items may interact with your medicine. What should I watch for while using  this medication? Your condition will be monitored carefully while you are receiving this medicine. You will need important blood work done while you are taking this medicine. This drug may make you feel generally unwell. This is not uncommon, as chemotherapy can affect healthy cells as well as cancer cells. Report any side effects. Continue your course of treatment even though you feel ill unless your doctor tells you to stop. In some cases, you may be given additional medicines to help with side effects. Follow all directions for their use. You may get drowsy or dizzy. Do not drive, use machinery, or do anything that needs mental alertness until you know how this medicine affects you. Do not stand or sit up quickly, especially if you are an older patient. This reduces the risk of dizzy or fainting spells. Call your health care professional for advice if you get a fever, chills, or sore throat, or other symptoms of a cold or flu. Do not treat yourself. This medicine decreases your body's ability to fight infections. Try to avoid being around people who are sick. Avoid taking products that contain aspirin, acetaminophen, ibuprofen, naproxen, or ketoprofen unless instructed by your doctor. These medicines may hide a fever. This medicine may increase your risk to bruise or bleed. Call your doctor or health care professional if you notice any unusual bleeding. Be careful brushing and flossing your teeth or using a toothpick because you may get an infection or bleed more easily. If you have any dental work done, tell your dentist you are receiving this medicine. Do not become pregnant while taking this medicine or for 6 months after stopping it. Women should inform their health care professional if they wish to become pregnant or think they might be pregnant. Men should not father a child while taking this medicine and for 3 months after stopping it. There is potential for serious side effects to an unborn  child. Talk to your health care professional for more information. Do not breast-feed an infant while taking this medicine or for 7 days after stopping it. This medicine has caused ovarian failure in some women. This medicine may make it more difficult to get pregnant. Talk to your health care professional if you are concerned about your fertility. This medicine has caused decreased sperm counts in some men. This may make it more difficult to father a child. Talk to your health care professional if you are concerned about your fertility. What side effects may I notice from receiving this medication? Side effects that you should report to your doctor or health care professional as soon as possible: allergic reactions like skin rash, itching or hives, swelling of the face, lips, or tongue chest pain diarrhea flushing, runny nose, sweating during infusion low blood counts - this medicine may decrease the number of white blood cells, red blood cells  and platelets. You may be at increased risk for infections and bleeding. nausea, vomiting pain, swelling, warmth in the leg signs of decreased platelets or bleeding - bruising, pinpoint red spots on the skin, black, tarry stools, blood in the urine signs of infection - fever or chills, cough, sore throat, pain or difficulty passing urine signs of decreased red blood cells - unusually weak or tired, fainting spells, lightheadedness Side effects that usually do not require medical attention (report to your doctor or health care professional if they continue or are bothersome): constipation hair loss headache loss of appetite mouth sores stomach pain This list may not describe all possible side effects. Call your doctor for medical advice about side effects. You may report side effects to FDA at 1-800-FDA-1088. Where should I keep my medication? This drug is given in a hospital or clinic and will not be stored at home. NOTE: This sheet is a summary. It  may not cover all possible information. If you have questions about this medicine, talk to your doctor, pharmacist, or health care provider.  2022 Elsevier/Gold Standard (2021-02-04 00:00:00) Bevacizumab injection What is this medication? BEVACIZUMAB (be va SIZ yoo mab) is a monoclonal antibody. It is used to treat many types of cancer. This medicine may be used for other purposes; ask your health care provider or pharmacist if you have questions. COMMON BRAND NAME(S): Alymsys, Avastin, MVASI, Noah Charon What should I tell my care team before I take this medication? They need to know if you have any of these conditions: diabetes heart disease high blood pressure history of coughing up blood prior anthracycline chemotherapy (e.g., doxorubicin, daunorubicin, epirubicin) recent or ongoing radiation therapy recent or planning to have surgery stroke an unusual or allergic reaction to bevacizumab, hamster proteins, mouse proteins, other medicines, foods, dyes, or preservatives pregnant or trying to get pregnant breast-feeding How should I use this medication? This medicine is for infusion into a vein. It is given by a health care professional in a hospital or clinic setting. Talk to your pediatrician regarding the use of this medicine in children. Special care may be needed. Overdosage: If you think you have taken too much of this medicine contact a poison control center or emergency room at once. NOTE: This medicine is only for you. Do not share this medicine with others. What if I miss a dose? It is important not to miss your dose. Call your doctor or health care professional if you are unable to keep an appointment. What may interact with this medication? Interactions are not expected. This list may not describe all possible interactions. Give your health care provider a list of all the medicines, herbs, non-prescription drugs, or dietary supplements you use. Also tell them if you smoke,  drink alcohol, or use illegal drugs. Some items may interact with your medicine. What should I watch for while using this medication? Your condition will be monitored carefully while you are receiving this medicine. You will need important blood work and urine testing done while you are taking this medicine. This medicine may increase your risk to bruise or bleed. Call your doctor or health care professional if you notice any unusual bleeding. Before having surgery, talk to your health care provider to make sure it is ok. This drug can increase the risk of poor healing of your surgical site or wound. You will need to stop this drug for 28 days before surgery. After surgery, wait at least 28 days before restarting this drug. Make sure the  surgical site or wound is healed enough before restarting this drug. Talk to your health care provider if questions. Do not become pregnant while taking this medicine or for 6 months after stopping it. Women should inform their doctor if they wish to become pregnant or think they might be pregnant. There is a potential for serious side effects to an unborn child. Talk to your health care professional or pharmacist for more information. Do not breast-feed an infant while taking this medicine and for 6 months after the last dose. This medicine has caused ovarian failure in some women. This medicine may interfere with the ability to have a child. You should talk to your doctor or health care professional if you are concerned about your fertility. What side effects may I notice from receiving this medication? Side effects that you should report to your doctor or health care professional as soon as possible: allergic reactions like skin rash, itching or hives, swelling of the face, lips, or tongue chest pain or chest tightness chills coughing up blood high fever seizures severe constipation signs and symptoms of bleeding such as bloody or black, tarry stools; red or  dark-brown urine; spitting up blood or brown material that looks like coffee grounds; red spots on the skin; unusual bruising or bleeding from the eye, gums, or nose signs and symptoms of a blood clot such as breathing problems; chest pain; severe, sudden headache; pain, swelling, warmth in the leg signs and symptoms of a stroke like changes in vision; confusion; trouble speaking or understanding; severe headaches; sudden numbness or weakness of the face, arm or leg; trouble walking; dizziness; loss of balance or coordination stomach pain sweating swelling of legs or ankles vomiting weight gain Side effects that usually do not require medical attention (report to your doctor or health care professional if they continue or are bothersome): back pain changes in taste decreased appetite dry skin nausea tiredness This list may not describe all possible side effects. Call your doctor for medical advice about side effects. You may report side effects to FDA at 1-800-FDA-1088. Where should I keep my medication? This drug is given in a hospital or clinic and will not be stored at home. NOTE: This sheet is a summary. It may not cover all possible information. If you have questions about this medicine, talk to your doctor, pharmacist, or health care provider.  2022 Elsevier/Gold Standard (2021-02-04 00:00:00)

## 2021-04-15 NOTE — Progress Notes (Signed)
Discharged home with pump intact and infusing

## 2021-04-16 ENCOUNTER — Telehealth: Payer: Self-pay | Admitting: Internal Medicine

## 2021-04-16 NOTE — Telephone Encounter (Signed)
Cancelled appt per sch msg. Called and left msg

## 2021-04-17 ENCOUNTER — Other Ambulatory Visit: Payer: Self-pay

## 2021-04-17 ENCOUNTER — Inpatient Hospital Stay: Payer: PRIVATE HEALTH INSURANCE

## 2021-04-17 ENCOUNTER — Inpatient Hospital Stay: Payer: PRIVATE HEALTH INSURANCE | Admitting: Internal Medicine

## 2021-04-17 VITALS — BP 114/66 | HR 102 | Temp 98.3°F | Resp 16 | Ht 68.0 in | Wt 187.0 lb

## 2021-04-17 DIAGNOSIS — C182 Malignant neoplasm of ascending colon: Secondary | ICD-10-CM

## 2021-04-17 DIAGNOSIS — Z5111 Encounter for antineoplastic chemotherapy: Secondary | ICD-10-CM | POA: Diagnosis not present

## 2021-04-17 MED ORDER — SODIUM CHLORIDE 0.9% FLUSH
10.0000 mL | INTRAVENOUS | Status: DC | PRN
  Administered 2021-04-17: 10 mL

## 2021-04-17 MED ORDER — HEPARIN SOD (PORK) LOCK FLUSH 100 UNIT/ML IV SOLN
500.0000 [IU] | Freq: Once | INTRAVENOUS | Status: AC | PRN
  Administered 2021-04-17: 500 [IU]

## 2021-04-17 NOTE — Patient Instructions (Signed)
Fluorouracil, 5-FU injection What is this medication? FLUOROURACIL, 5-FU (flure oh YOOR a sil) is a chemotherapy drug. It slows the growth of cancer cells. This medicine is used to treat many types of cancer like breast cancer, colon or rectal cancer, pancreatic cancer, and stomach cancer. This medicine may be used for other purposes; ask your health care provider or pharmacist if you have questions. COMMON BRAND NAME(S): Adrucil What should I tell my care team before I take this medication? They need to know if you have any of these conditions: blood disorders dihydropyrimidine dehydrogenase (DPD) deficiency infection (especially a virus infection such as chickenpox, cold sores, or herpes) kidney disease liver disease malnourished, poor nutrition recent or ongoing radiation therapy an unusual or allergic reaction to fluorouracil, other chemotherapy, other medicines, foods, dyes, or preservatives pregnant or trying to get pregnant breast-feeding How should I use this medication? This drug is given as an infusion or injection into a vein. It is administered in a hospital or clinic by a specially trained health care professional. Talk to your pediatrician regarding the use of this medicine in children. Special care may be needed. Overdosage: If you think you have taken too much of this medicine contact a poison control center or emergency room at once. NOTE: This medicine is only for you. Do not share this medicine with others. What if I miss a dose? It is important not to miss your dose. Call your doctor or health care professional if you are unable to keep an appointment. What may interact with this medication? Do not take this medicine with any of the following medications: live virus vaccines This medicine may also interact with the following medications: medicines that treat or prevent blood clots like warfarin, enoxaparin, and dalteparin This list may not describe all possible  interactions. Give your health care provider a list of all the medicines, herbs, non-prescription drugs, or dietary supplements you use. Also tell them if you smoke, drink alcohol, or use illegal drugs. Some items may interact with your medicine. What should I watch for while using this medication? Visit your doctor for checks on your progress. This drug may make you feel generally unwell. This is not uncommon, as chemotherapy can affect healthy cells as well as cancer cells. Report any side effects. Continue your course of treatment even though you feel ill unless your doctor tells you to stop. In some cases, you may be given additional medicines to help with side effects. Follow all directions for their use. Call your doctor or health care professional for advice if you get a fever, chills or sore throat, or other symptoms of a cold or flu. Do not treat yourself. This drug decreases your body's ability to fight infections. Try to avoid being around people who are sick. This medicine may increase your risk to bruise or bleed. Call your doctor or health care professional if you notice any unusual bleeding. Be careful brushing and flossing your teeth or using a toothpick because you may get an infection or bleed more easily. If you have any dental work done, tell your dentist you are receiving this medicine. Avoid taking products that contain aspirin, acetaminophen, ibuprofen, naproxen, or ketoprofen unless instructed by your doctor. These medicines may hide a fever. Do not become pregnant while taking this medicine. Women should inform their doctor if they wish to become pregnant or think they might be pregnant. There is a potential for serious side effects to an unborn child. Talk to your health care   professional or pharmacist for more information. Do not breast-feed an infant while taking this medicine. Men should inform their doctor if they wish to father a child. This medicine may lower sperm  counts. Do not treat diarrhea with over the counter products. Contact your doctor if you have diarrhea that lasts more than 2 days or if it is severe and watery. This medicine can make you more sensitive to the sun. Keep out of the sun. If you cannot avoid being in the sun, wear protective clothing and use sunscreen. Do not use sun lamps or tanning beds/booths. What side effects may I notice from receiving this medication? Side effects that you should report to your doctor or health care professional as soon as possible: allergic reactions like skin rash, itching or hives, swelling of the face, lips, or tongue low blood counts - this medicine may decrease the number of white blood cells, red blood cells and platelets. You may be at increased risk for infections and bleeding. signs of infection - fever or chills, cough, sore throat, pain or difficulty passing urine signs of decreased platelets or bleeding - bruising, pinpoint red spots on the skin, black, tarry stools, blood in the urine signs of decreased red blood cells - unusually weak or tired, fainting spells, lightheadedness breathing problems changes in vision chest pain mouth sores nausea and vomiting pain, swelling, redness at site where injected pain, tingling, numbness in the hands or feet redness, swelling, or sores on hands or feet stomach pain unusual bleeding Side effects that usually do not require medical attention (report to your doctor or health care professional if they continue or are bothersome): changes in finger or toe nails diarrhea dry or itchy skin hair loss headache loss of appetite sensitivity of eyes to the light stomach upset unusually teary eyes This list may not describe all possible side effects. Call your doctor for medical advice about side effects. You may report side effects to FDA at 1-800-FDA-1088. Where should I keep my medication? This drug is given in a hospital or clinic and will not be  stored at home. NOTE: This sheet is a summary. It may not cover all possible information. If you have questions about this medicine, talk to your doctor, pharmacist, or health care provider.  2022 [employer] (2021-02-04 00:00:00)  

## 2021-04-17 NOTE — Progress Notes (Signed)
PT DECLINED IV FLUIDS TODAY, FEELING WELL, NO COMPLAINTS

## 2021-04-17 NOTE — Progress Notes (Signed)
Shannon [name]  [address] [place],  [place]  [address] [phone]  Clinic Day:  04/28/2021  Referring physician: Greig Right, MD  This document serves as a record of services personally performed by Shannon Macarthur Critchley, MD. It was created on their behalf by [facility], a trained medical scribe. The creation of this record is based on the scribe's personal observations and the provider's statements to them.  HISTORY OF PRESENT ILLNESS:  The patient is a 55 y.o. female with metastatic colon cancer, which includes spread of disease to her abdominal cavity, lungs, brain and [name] adrenal gland. She comes in today prior to her 20th cycle of FOLFIRI/Avastin.  The patient claims to have toleraed her 19th cycle of treatment fairly well.  Her diarrhea remains prominent, for which she receives atropine to offset the effects of irinotecan.  She also used over-the-counter antidiarrheal therapy.  Overall, she is doing well.  She denies having any new symptoms/findings which concern her for disease progression.     With respect to her colon cancer history, she is status post a [name] hemicolectomy in early October 2018, followed by 12 cycles of adjuvant chemotherapy, which were completed in April 2019.  In December 2021, she underwent a left cerebellar metastatectomy, whose pathology was consistent with metastatic colon cancer.  Tumor testing did come back MMR normal.  CT scans recently showed evidence of her cancer being in multiple locations, which has her on palliative chemotherapy at this time.    PHYSICAL EXAM:  Blood pressure 126/83, pulse (!) 119, temperature 97.7 F (36.5 C), resp. rate 14, height 5' 8"  (1.727 m), weight 185 lb 3.2 oz (84 kg), last menstrual period 02/22/2019, SpO2 97 %. Wt Readings from Last 3 Encounters:  04/28/21 188 lb (85.3 kg)  04/28/21 185 lb 3.2 oz (84 kg)  04/23/21 178 lb (80.7 kg)   Body mass index is 28.16 kg/m. Performance  status (ECOG): 1 Physical Exam Constitutional:      General: She is not in acute distress.    Appearance: Normal appearance. She is normal weight. She is not ill-appearing.  HENT:     Head: Normocephalic and atraumatic.  Eyes:     General: No scleral icterus.    Extraocular Movements: Extraocular movements intact.     Conjunctiva/sclera: Conjunctivae normal.     Pupils: Pupils are equal, round, and reactive to light.  Cardiovascular:     Rate and Rhythm: Regular rhythm. Tachycardia present.     Pulses: Normal pulses.     Heart sounds: Normal heart sounds. No murmur heard.   No friction rub. No gallop.  Pulmonary:     Effort: Pulmonary effort is normal. No respiratory distress.     Breath sounds: Normal breath sounds.  Abdominal:     General: Bowel sounds are normal. There is no distension.     Palpations: Abdomen is soft. There is no hepatomegaly, splenomegaly or mass.     Tenderness: There is no abdominal tenderness.  Musculoskeletal:        General: Normal range of motion.     Cervical back: Normal range of motion and neck supple.     [name] lower leg: No edema.     Left lower leg: No edema.  Lymphadenopathy:     Cervical: No cervical adenopathy.  Skin:    General: Skin is warm and dry.  Neurological:     General: No focal deficit present.     Mental Status: She is alert  and oriented to person, place, and time. Mental status is at baseline.  Psychiatric:        Mood and Affect: Mood normal.        Behavior: Behavior normal.        Thought Content: Thought content normal.        Judgment: Judgment normal.    LABS:    Latest Reference Range & Units 04/28/21 00:00  Sodium 137 - 147  141 (E)  Potassium 3.4 - 5.3  3.5 (E)  Chloride 99 - 108  107 (E)  CO2 13 - 22  24 ! (E)  Glucose  222 (E)  BUN 4 - 21  10 (E)  Creatinine 0.5 - 1.1  1.1 (E)  Calcium 8.7 - 10.7  9.1 (E)  Alkaline Phosphatase 25 - 125  152 ! (E)  Albumin 3.5 - 5.0  4.1 (E)  AST 13 - 35  29 (E)  ALT 7  - 35  27 (E)  Bilirubin, Total  0.5 (E)  WBC  2.9 (E)  RBC 3.87 - 5.11  3.52 ! (E)  Hemoglobin 12.0 - 16.0  11.6 ! (E)  HCT 36 - 46  35 ! (E)  Platelets 150 - 399  138 ! (E)  NEUT#  0.73 (E)    ASSESSMENT & PLAN:  Assessment/Plan:  A 55 y.o. female with metastatic colon cancer. Due to her low white count, her 20th cycle of FOLFIRI/Avastin will be delayed for 1 week.  I will give her Zarxio 480 mcg today to help hasten her white count recovery in preparation for her next cycle of chemotherapy.  I will see her back 2 weeks later before she heads into her 21st cycle of FOLFIRI/Avastin. The patient understands all the plans discussed today and is in agreement with them.     I, Shannon [name], am acting as scribe for Shannon Potter, MD    I have reviewed this report as typed by the medical scribe, and it is complete and accurate.  Shannon Macarthur Critchley, MD

## 2021-04-20 ENCOUNTER — Other Ambulatory Visit: Payer: Self-pay | Admitting: Hematology and Oncology

## 2021-04-20 DIAGNOSIS — T451X5A Adverse effect of antineoplastic and immunosuppressive drugs, initial encounter: Secondary | ICD-10-CM

## 2021-04-20 DIAGNOSIS — R112 Nausea with vomiting, unspecified: Secondary | ICD-10-CM

## 2021-04-21 ENCOUNTER — Other Ambulatory Visit: Payer: Self-pay | Admitting: *Deleted

## 2021-04-21 ENCOUNTER — Encounter: Payer: Self-pay | Admitting: Oncology

## 2021-04-21 ENCOUNTER — Other Ambulatory Visit: Payer: Self-pay | Admitting: Radiation Therapy

## 2021-04-21 ENCOUNTER — Ambulatory Visit (INDEPENDENT_AMBULATORY_CARE_PROVIDER_SITE_OTHER): Payer: No Typology Code available for payment source | Admitting: Psychiatry

## 2021-04-21 ENCOUNTER — Encounter: Payer: Self-pay | Admitting: Psychiatry

## 2021-04-21 ENCOUNTER — Other Ambulatory Visit: Payer: Self-pay

## 2021-04-21 DIAGNOSIS — G3184 Mild cognitive impairment, so stated: Secondary | ICD-10-CM | POA: Diagnosis not present

## 2021-04-21 DIAGNOSIS — G4721 Circadian rhythm sleep disorder, delayed sleep phase type: Secondary | ICD-10-CM

## 2021-04-21 DIAGNOSIS — C7931 Secondary malignant neoplasm of brain: Secondary | ICD-10-CM

## 2021-04-21 DIAGNOSIS — Z95828 Presence of other vascular implants and grafts: Secondary | ICD-10-CM

## 2021-04-21 DIAGNOSIS — F422 Mixed obsessional thoughts and acts: Secondary | ICD-10-CM

## 2021-04-21 DIAGNOSIS — F333 Major depressive disorder, recurrent, severe with psychotic symptoms: Secondary | ICD-10-CM | POA: Diagnosis not present

## 2021-04-21 NOTE — Progress Notes (Signed)
Shannon [name] [id number] [birth year] 55 y.o.    Subjective:   Patient ID:  Shannon [name] is a 55 y.o. (DOB [birth year]) female.  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  seen November 2020 .  No meds were changed at that time.  10/16/19 the following is noted at this appt: Asked questions about sleep supplement.   Just started GI doctor.   Good overall. No mood swings. No depressive episodes.  Cry more with hormonal changes more easily.   Probably sleep too much to avoid.  Occ stress from work interferes with sleep.  Not as productive from home.  Hopes to get back to the office.  Still working from home.  Boss noticed her forgetfulness with deadlines and missing issues in emails.  Used to be sharper. Wonders if related to meds.  Can usually nap if desired for years and not seasonal.  Only anxiety is Covid related. More poor self care.   Hysterectomy December 2020. No med changes  03/18/2020 appointment with the following noted: July had Covid and both parents and her father died from [place]. Able to help mom with arrangements. Required one iron infusion this year. Never took NAC bc it was too hard to take. Sleep, eating schedule is irregular. More productive at home helps her feel better. Plan: No med changes. 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 For a month had dizzy, falls, NV all of which cleared quickly after removal.  Radiation to brain after this. More CA in lungs and abdomen and having chemo.  Oncologist said she has about 5 years of lifespan left.  MRI and CT pending. Gotten into  therapy starting [name] next week.   Pray a lot and good connection with friends.  Good support.  Trying to do things for fun.   Is working but finding it difficult mentally and physically DT SE fatigue from chemo. Considering LT disability but $ concerns.   If anxious trouble falling asleep.   Works from home.   D 53 yo.   Plan: No med changes except added modafinil  11/26/2020 appointment with the following noted: Added modafinil and didn't notice much from it. Sleep average about 7 hours.  Would prefer 10 hours and may nap here and there in week of chemo even on chemo.  Had to get it at [facility] with [facility]. No dx of OSA. Last 2 weeks a little low emotionally and mentally after up for a couple of weeks with company and it wore her out. Sleepiness is normally worse than tiredness. Only situational mood effects. Plan: No benefit 200 mg modafinil nor SE therefore Increase Modafinil to 300 mg daily for excessive sleepiness  04/21/2021 appointment with the following noted: Still dealing with chemotx for colon cancer Thinks increase modafinil questionable but misses it more than takes it. No SE. Things are getting harder rough month with more depression and anxiety related to cancer and daughter.  Work is hard.  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  Missing deadlines.   Needs to call social security. D 53 yo and in therapy  with [name].  So angry with patient. Has been able to stay on branded Seroquel. Tumors on CT scan  are shrinking recently.  More sadness and gloominess.  Less social and less motivation. Worry over work and end of life issues.  No repetitive obsessions.    Patient denies difficulty with sleep maintenance.   Denies appetite disturbance.  Patient reports that energy and motivation have been good.  Patient denies any difficulty with concentration.  Patient denies any suicidal ideation.   Past Psychiatric Medication Trials: HX RELAPSE  PSYCHOSIS WITH GENERIC QUETIAPINE,  Seroquel 800, Risperdal 2, inVega 3, Abilify 15,  Citalopram, fluoxetine 80, sertraline to 50,  lamotrigine 200,  Has been under the care of the psychiatrist since March 2000  Review of Systems:  Review of Systems  Constitutional:  Positive for fatigue.  HENT:  Positive for rhinorrhea.   Eyes:  Positive for discharge.  Gastrointestinal:  Positive for abdominal distention and constipation.  Neurological:  Positive for weakness. Negative for tremors.   Medications: I have reviewed the patient's current medications.  Current Outpatient Medications  Medication Sig Dispense Refill   acetaminophen (TYLENOL) 325 MG tablet Take 2 tablets (650 mg total) by mouth every 6 (six) hours as needed for mild pain.     atorvastatin (LIPITOR) 10 MG tablet Take by mouth.     CONTOUR NEXT TEST test strip 3 (three) times daily.     Dulaglutide (TRULICITY) 1.5 JW/1.1BJ SOPN Inject into the skin.     fluvoxaMINE (LUVOX) 100 MG tablet Take 3 tablets (300 mg total) by mouth at bedtime. 270 tablet 0   hyoscyamine (LEVSIN SL) 0.125 MG SL tablet Place 1 tablet (0.125 mg total) under the tongue every 6 (six) hours as needed. 45 tablet 1   insulin aspart (NOVOLOG FLEXPEN) 100 UNIT/ML FlexPen Inject 8 Units into the skin 3 (three) times daily with meals. 15 mL 11   insulin glargine (LANTUS) 100 UNIT/ML Solostar Pen Inject 26 Units into the skin daily. 15 mL 11   lamoTRIgine (LAMICTAL) 150 MG tablet Take 1 tablet (150 mg total) by mouth daily. 90 tablet 3   levothyroxine (SYNTHROID) 75 MCG tablet Take 1 tablet (75 mcg total) by mouth daily. 30 tablet 0   loratadine (CLARITIN) 10 MG tablet Take 10 mg by mouth daily.     LORazepam (ATIVAN) 0.5 MG tablet Take 0.5 mg by mouth every 8 (eight) hours as needed for anxiety.     metFORMIN (GLUCOPHAGE) 500 MG tablet Take 1 tablet (500 mg total) by mouth 2 (two) times daily with a meal. 60 tablet 0   modafinil (PROVIGIL) 200 MG tablet Take 1.5  tablets (300 mg total) by mouth daily. 45 tablet 1   Multiple Vitamin (MULTI-VITAMIN) tablet Take 1 tablet by mouth daily.     NOVOFINE PEN NEEDLE 32G X 6 MM MISC SMARTSIG:Syringe(s) SUB-Q     Omega-3 1000 MG CAPS Take by mouth.     ondansetron (ZOFRAN) 4 MG tablet Take 1 tablet (4 mg total) by mouth every 4 (four) hours as needed for nausea. 90 tablet 3   pantoprazole (PROTONIX) 40 MG tablet TAKE 1 TABLET BY MOUTH EVERYDAY AT BEDTIME 30 tablet 0   prochlorperazine (COMPAZINE) 10 MG tablet TAKE 1 TABLET BY MOUTH EVERY 6 HOURS AS NEEDED FOR NAUSEA OR VOMITING. 90 tablet 3   QUEtiapine (SEROQUEL) 400 MG tablet Take 2 tablets (800 mg total) by mouth at bedtime. 30 tablet 0   senna-docusate (SENOKOT-S) 8.6-50 MG tablet  Take 2 tablets by mouth at bedtime.     zolpidem (AMBIEN) 5 MG tablet Take 1 tablet (5 mg total) by mouth at bedtime as needed for sleep. 30 tablet 0   No current facility-administered medications for this visit.    Medication Side Effects: None  Allergies:  Allergies  Allergen Reactions   Clindamycin/Lincomycin Rash   Penicillins Rash    Reaction: 10 years    Past Medical History:  Diagnosis Date   Anemia    Iron De   Anxiety    Blood clot in vein    Bowel obstruction ([place])    Colon cancer ([place]) 2018   treated with surgery and chemotherapy   Depression    Diabetes mellitus without complication ([place])    Type II   DVT (deep venous thrombosis) ([place]) 2019   behind right knee- while she wasa on chemo   GERD (gastroesophageal reflux disease)    History of blood transfusion    History of chemotherapy    History of kidney stones 2012   passed   Hypothyroidism    Obesity     Family History  Problem Relation Age of Onset   Irritable bowel syndrome Mother    Colon polyps Father    Breast cancer Maternal Grandmother    Diabetes Maternal Grandmother    Diabetes Maternal Grandfather    Breast cancer Paternal Grandmother    Colon polyps Maternal Uncle    Stomach  cancer Neg Hx    Pancreatic cancer Neg Hx    Esophageal cancer Neg Hx    Colon cancer Neg Hx     Social History   Socioeconomic History   Marital status: Widowed    Spouse name: Not on file   Number of children: Not on file   Years of education: Not on file   Highest education level: Not on file  Occupational History   Occupation: care coordinator   Tobacco Use   Smoking status: Never   Smokeless tobacco: Never  Vaping Use   Vaping Use: Never used  Substance and Sexual Activity   Alcohol use: Yes    Comment: rare   Drug use: Never   Sexual activity: Not Currently  Other Topics Concern   Not on file  Social History Narrative   Not on file   Social Determinants of Health   Financial Resource Strain: Not on file  Food Insecurity: Not on file  Transportation Needs: Not on file  Physical Activity: Not on file  Stress: Not on file  Social Connections: Not on file  Intimate Partner Violence: Not on file    Past Medical History, Surgical history, Social history, and Family history were reviewed and updated as appropriate.   Please see review of systems for further details on the patient's review from today.   Objective:   Physical Exam:  LMP 02/22/2019 (Approximate)   Physical Exam Constitutional:      General: She is not in acute distress. Musculoskeletal:        General: No deformity.  Neurological:     Mental Status: She is alert and oriented to person, place, and time.     Coordination: Coordination normal.  Psychiatric:        Attention and Perception: Attention and perception normal. She does not perceive auditory or visual hallucinations.        Mood and Affect: Mood is anxious and depressed. Affect is not labile, blunt, angry or inappropriate.        Speech:  Speech normal.        Behavior: Behavior normal.        Thought Content: Thought content normal. Thought content is not paranoid or delusional. Thought content does not include homicidal or suicidal  ideation.        Cognition and Memory: Cognition and memory normal.        Judgment: Judgment normal.     Comments: Insight intact More down dealing with health and daughter    Lab Review:     Component Value Date/Time   NA 140 04/10/2021 0000   K 3.6 04/10/2021 0000   CL 108 04/10/2021 0000   CO2 20 04/10/2021 0000   GLUCOSE 234 (H) 06/07/2020 0525   BUN 12 04/10/2021 0000   CREATININE 1.4 (A) 04/10/2021 0000   CREATININE 0.97 06/07/2020 0525   CALCIUM 8.8 04/10/2021 0000   PROT 5.2 (L) 06/07/2020 0525   ALBUMIN 4.3 04/10/2021 0000   AST 47 (A) 04/10/2021 0000   ALT 33 04/10/2021 0000   ALKPHOS 163 (A) 04/10/2021 0000   BILITOT 0.3 06/07/2020 0525   GFRNONAA >60 06/07/2020 0525       Component Value Date/Time   WBC 2.9 04/14/2021 0000   WBC 4.8 06/07/2020 0525   RBC 3.67 (A) 04/14/2021 0000   HGB 12.0 04/14/2021 0000   HCT 37 04/14/2021 0000   PLT 118 (A) 04/14/2021 0000   MCV 101 (A) 04/14/2021 0000   MCH 32.6 09/16/2020 0000   MCHC 33 09/16/2020 0000   RDW 13.5 06/07/2020 0525   LYMPHSABS 1.0 06/07/2020 0525   MONOABS 0.4 06/07/2020 0525   EOSABS 0.0 06/07/2020 0525   BASOSABS 0.0 06/07/2020 0525    No results found for: POCLITH, LITHIUM   No results found for: PHENYTOIN, PHENOBARB, VALPROATE, CBMZ   .res Assessment: Plan:    Severe recurrent major depression with psychotic features ([place])  Mixed obsessional thoughts and acts  Delayed sleep phase syndrome  Mild cognitive impairment   Overall mood and anxiety are less well managed.  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 In general has handled this well except some trouble going to sleep on occ with anxiety.  However more recently she has become more depressed and anxious affecting work and home life.  She is interested in some sort of change but historically has done well with Seroquel so is nervous about making a change.  Try to avoid  Xanax if possible BC DDI with Luvox. Ambien 5 prn.  Disc amnesia risk.  No benefit 200 mg modafinil nor SE therefore OK continue trial  Modafinil to 300 mg daily for excessive sleepiness  Discussed potential metabolic side effects associated with atypical antipsychotics, as well as potential risk for movement side effects. Advised pt to contact office if movement side effects occur.   Counseled patient regarding potential benefits, risks, and side effects of Lamictal to include potential risk of Stevens-Johnson syndrome. Advised patient to stop taking Lamictal and contact office immediately if rash develops and to seek urgent medical attention if rash is severe and/or spreading quickly.  HX RELAPSE PSYCHOSIS WITH GENERIC QUETIAPINE, Continue branded Seroquel 800 mg HS and lamotrigine 150 mg daily and Luvox 300 mg daily  Vraylar 1.5 mg daily for 2 weeks, Then if tolerated increase to 3 mg daily. If mood improves then reduce Seroquel to 1 and 1/2 tablets.  FU 6 weeks  Lynder Parents, MD, DFAPA   Please see After  Visit Summary for patient specific instructions.  Future Appointments  Date Time Provider [place]  04/22/2021  1:30 PM [facility] INFUSION CHAIR 8 [facility] None  04/23/2021 11:00 AM [facility] INFUSION CHAIR 6 [facility] None  04/28/2021  9:15 AM [facility]-LAB [facility] None  04/28/2021  9:45 AM Lewis, Dequincy A, MD [facility] None  04/29/2021 11:00 AM [facility] INFUSION CHAIR 4 [facility] None  05/01/2021  2:00 PM [facility] INFUSION CHAIR 7 [facility] None  05/02/2021 12:00 PM GI-315 MR 3 GI-315MRI GI-[address]  05/05/2021  7:00 AM [facility]TUMOR BOARD CONFERENCE [facility] None  05/06/2021 10:30 AM Vaslow, Acey Lav, MD [facility] None  05/28/2021  8:00 AM Edgardo Roys, PsyD [facility] CPR  08/06/2021 10:00 AM Edgardo Roys, PsyD [facility] CPR  09/08/2021  4:00 PM Edgardo Roys, PsyD [facility] CPR  10/13/2021  4:00 PM Edgardo Roys, PsyD [facility] CPR    No orders of  the defined types were placed in this encounter.     -------------------------------

## 2021-04-21 NOTE — Patient Instructions (Addendum)
Vraylar 1.5 mg daily for 2 weeks, Then if tolerated increase to 3 mg daily. If mood improves then reduce Seroquel to 1 and 1/2 tablets.

## 2021-04-21 NOTE — Progress Notes (Signed)
Orders entered for port access the day of scheduled brain MRI at [place] [facility] [place].   Mont Dutton R.T.(R)(T) Radiation Special Procedures Navigator

## 2021-04-22 ENCOUNTER — Other Ambulatory Visit: Payer: Self-pay | Admitting: Psychiatry

## 2021-04-22 ENCOUNTER — Telehealth: Payer: Self-pay | Admitting: Psychiatry

## 2021-04-22 ENCOUNTER — Inpatient Hospital Stay: Payer: PRIVATE HEALTH INSURANCE

## 2021-04-22 VITALS — BP 118/82 | HR 109 | Temp 98.2°F | Resp 18 | Ht 68.0 in | Wt 180.0 lb

## 2021-04-22 DIAGNOSIS — C182 Malignant neoplasm of ascending colon: Secondary | ICD-10-CM

## 2021-04-22 DIAGNOSIS — F333 Major depressive disorder, recurrent, severe with psychotic symptoms: Secondary | ICD-10-CM

## 2021-04-22 DIAGNOSIS — Z5111 Encounter for antineoplastic chemotherapy: Secondary | ICD-10-CM | POA: Diagnosis not present

## 2021-04-22 MED ORDER — FILGRASTIM-SNDZ 480 MCG/0.8ML IJ SOSY
480.0000 ug | PREFILLED_SYRINGE | Freq: Once | INTRAMUSCULAR | Status: AC
  Administered 2021-04-22: 480 ug via SUBCUTANEOUS
  Filled 2021-04-22: qty 0.8

## 2021-04-22 NOTE — Patient Instructions (Signed)
Filgrastim, G-CSF injection °What is this medication? °FILGRASTIM, G-CSF (fil GRA stim) is a granulocyte colony-stimulating factor that stimulates the growth of neutrophils, a type of white blood cell (WBC) important in the body's fight against infection. It is used to reduce the incidence of fever and infection in patients with certain types of cancer who are receiving chemotherapy that affects the bone marrow, to stimulate blood cell production for removal of WBCs from the body prior to a bone marrow transplantation, to reduce the incidence of fever and infection in patients who have severe chronic neutropenia, and to improve survival outcomes following high-dose radiation exposure that is toxic to the bone marrow. °This medicine may be used for other purposes; ask your health care provider or pharmacist if you have questions. °COMMON BRAND NAME(S): Neupogen, Nivestym, Releuko, Zarxio °What should I tell my care team before I take this medication? °They need to know if you have any of these conditions: °kidney disease °latex allergy °ongoing radiation therapy °sickle cell disease °an unusual or allergic reaction to filgrastim, pegfilgrastim, other medicines, foods, dyes, or preservatives °pregnant or trying to get pregnant °breast-feeding °How should I use this medication? °This medicine is for injection under the skin or infusion into a vein. As an infusion into a vein, it is usually given by a health care professional in a hospital or clinic setting. If you get this medicine at home, you will be taught how to prepare and give this medicine. Refer to the Instructions for Use that come with your medication packaging. Use exactly as directed. Take your medicine at regular intervals. Do not take your medicine more often than directed. °It is important that you put your used needles and syringes in a special sharps container. Do not put them in a trash can. If you do not have a sharps container, call your pharmacist  or healthcare provider to get one. °Talk to your pediatrician regarding the use of this medicine in children. While this drug may be prescribed for children as young as 7 months for selected conditions, precautions do apply. °Overdosage: If you think you have taken too much of this medicine contact a poison control center or emergency room at once. °NOTE: This medicine is only for you. Do not share this medicine with others. °What if I miss a dose? °It is important not to miss your dose. Call your doctor or health care professional if you miss a dose. °What may interact with this medication? °This medicine may interact with the following medications: °medicines that may cause a release of neutrophils, such as lithium °This list may not describe all possible interactions. Give your health care provider a list of all the medicines, herbs, non-prescription drugs, or dietary supplements you use. Also tell them if you smoke, drink alcohol, or use illegal drugs. Some items may interact with your medicine. °What should I watch for while using this medication? °Your condition will be monitored carefully while you are receiving this medicine. °You may need blood work done while you are taking this medicine. °Talk to your health care provider about your risk of cancer. You may be more at risk for certain types of cancer if you take this medicine. °What side effects may I notice from receiving this medication? °Side effects that you should report to your doctor or health care professional as soon as possible: °allergic reactions like skin rash, itching or hives, swelling of the face, lips, or tongue °back pain °dizziness or feeling faint °fever °pain, redness, or   irritation at site where injected °pinpoint red spots on the skin °shortness of breath or breathing problems °signs and symptoms of kidney injury like trouble passing urine, change in the amount of urine, or red or dark-brown urine °stomach or side pain, or pain at  the shoulder °swelling °tiredness °unusual bleeding or bruising °Side effects that usually do not require medical attention (report to your doctor or health care professional if they continue or are bothersome): °bone pain °cough °diarrhea °hair loss °headache °muscle pain °This list may not describe all possible side effects. Call your doctor for medical advice about side effects. You may report side effects to FDA at 1-800-FDA-1088. °Where should I keep my medication? °Keep out of the reach of children. °Store in a refrigerator between 2 and 8 degrees C (36 and 46 degrees F). Do not freeze. Keep in carton to protect from light. Throw away this medicine if vials or syringes are left out of the refrigerator for more than 24 hours. Throw away any unused medicine after the expiration date. °NOTE: This sheet is a summary. It may not cover all possible information. If you have questions about this medicine, talk to your doctor, pharmacist, or health care provider. °© 2022 [employer] (2021-02-04 00:00:00) ° °

## 2021-04-22 NOTE — Telephone Encounter (Signed)
Rx sent 

## 2021-04-22 NOTE — Telephone Encounter (Signed)
Shannon [name] was seen by Dr. Clovis Pu yesterday and she went to the pharmacy and they didn't have her Seroquel prescription ready. Could this be called in to:  [id number] - [place], [address]  Phone:  [phone]  Fax:  [phone]

## 2021-04-23 ENCOUNTER — Other Ambulatory Visit: Payer: Self-pay

## 2021-04-23 ENCOUNTER — Other Ambulatory Visit: Payer: Self-pay | Admitting: Psychiatry

## 2021-04-23 ENCOUNTER — Inpatient Hospital Stay: Payer: PRIVATE HEALTH INSURANCE

## 2021-04-23 VITALS — BP 138/85 | HR 108 | Temp 97.9°F | Resp 18 | Ht 68.0 in | Wt 178.0 lb

## 2021-04-23 DIAGNOSIS — F333 Major depressive disorder, recurrent, severe with psychotic symptoms: Secondary | ICD-10-CM

## 2021-04-23 DIAGNOSIS — Z5111 Encounter for antineoplastic chemotherapy: Secondary | ICD-10-CM | POA: Diagnosis not present

## 2021-04-23 DIAGNOSIS — C182 Malignant neoplasm of ascending colon: Secondary | ICD-10-CM

## 2021-04-23 MED ORDER — FILGRASTIM-SNDZ 480 MCG/0.8ML IJ SOSY
480.0000 ug | PREFILLED_SYRINGE | Freq: Once | INTRAMUSCULAR | Status: AC
  Administered 2021-04-23: 480 ug via SUBCUTANEOUS
  Filled 2021-04-23: qty 0.8

## 2021-04-23 NOTE — Patient Instructions (Signed)
Filgrastim, G-CSF injection °What is this medication? °FILGRASTIM, G-CSF (fil GRA stim) is a granulocyte colony-stimulating factor that stimulates the growth of neutrophils, a type of white blood cell (WBC) important in the body's fight against infection. It is used to reduce the incidence of fever and infection in patients with certain types of cancer who are receiving chemotherapy that affects the bone marrow, to stimulate blood cell production for removal of WBCs from the body prior to a bone marrow transplantation, to reduce the incidence of fever and infection in patients who have severe chronic neutropenia, and to improve survival outcomes following high-dose radiation exposure that is toxic to the bone marrow. °This medicine may be used for other purposes; ask your health care provider or pharmacist if you have questions. °COMMON BRAND NAME(S): Neupogen, Nivestym, Releuko, Zarxio °What should I tell my care team before I take this medication? °They need to know if you have any of these conditions: °kidney disease °latex allergy °ongoing radiation therapy °sickle cell disease °an unusual or allergic reaction to filgrastim, pegfilgrastim, other medicines, foods, dyes, or preservatives °pregnant or trying to get pregnant °breast-feeding °How should I use this medication? °This medicine is for injection under the skin or infusion into a vein. As an infusion into a vein, it is usually given by a health care professional in a hospital or clinic setting. If you get this medicine at home, you will be taught how to prepare and give this medicine. Refer to the Instructions for Use that come with your medication packaging. Use exactly as directed. Take your medicine at regular intervals. Do not take your medicine more often than directed. °It is important that you put your used needles and syringes in a special sharps container. Do not put them in a trash can. If you do not have a sharps container, call your pharmacist  or healthcare provider to get one. °Talk to your pediatrician regarding the use of this medicine in children. While this drug may be prescribed for children as young as 7 months for selected conditions, precautions do apply. °Overdosage: If you think you have taken too much of this medicine contact a poison control center or emergency room at once. °NOTE: This medicine is only for you. Do not share this medicine with others. °What if I miss a dose? °It is important not to miss your dose. Call your doctor or health care professional if you miss a dose. °What may interact with this medication? °This medicine may interact with the following medications: °medicines that may cause a release of neutrophils, such as lithium °This list may not describe all possible interactions. Give your health care provider a list of all the medicines, herbs, non-prescription drugs, or dietary supplements you use. Also tell them if you smoke, drink alcohol, or use illegal drugs. Some items may interact with your medicine. °What should I watch for while using this medication? °Your condition will be monitored carefully while you are receiving this medicine. °You may need blood work done while you are taking this medicine. °Talk to your health care provider about your risk of cancer. You may be more at risk for certain types of cancer if you take this medicine. °What side effects may I notice from receiving this medication? °Side effects that you should report to your doctor or health care professional as soon as possible: °allergic reactions like skin rash, itching or hives, swelling of the face, lips, or tongue °back pain °dizziness or feeling faint °fever °pain, redness, or   irritation at site where injected °pinpoint red spots on the skin °shortness of breath or breathing problems °signs and symptoms of kidney injury like trouble passing urine, change in the amount of urine, or red or dark-brown urine °stomach or side pain, or pain at  the shoulder °swelling °tiredness °unusual bleeding or bruising °Side effects that usually do not require medical attention (report to your doctor or health care professional if they continue or are bothersome): °bone pain °cough °diarrhea °hair loss °headache °muscle pain °This list may not describe all possible side effects. Call your doctor for medical advice about side effects. You may report side effects to FDA at 1-800-FDA-1088. °Where should I keep my medication? °Keep out of the reach of children. °Store in a refrigerator between 2 and 8 degrees C (36 and 46 degrees F). Do not freeze. Keep in carton to protect from light. Throw away this medicine if vials or syringes are left out of the refrigerator for more than 24 hours. Throw away any unused medicine after the expiration date. °NOTE: This sheet is a summary. It may not cover all possible information. If you have questions about this medicine, talk to your doctor, pharmacist, or health care provider. °© 2022 [employer] (2021-02-04 00:00:00) ° °

## 2021-04-28 ENCOUNTER — Inpatient Hospital Stay: Payer: PRIVATE HEALTH INSURANCE

## 2021-04-28 ENCOUNTER — Inpatient Hospital Stay (INDEPENDENT_AMBULATORY_CARE_PROVIDER_SITE_OTHER): Payer: PRIVATE HEALTH INSURANCE | Admitting: Oncology

## 2021-04-28 ENCOUNTER — Other Ambulatory Visit: Payer: Self-pay | Admitting: Pharmacist

## 2021-04-28 ENCOUNTER — Encounter: Payer: Self-pay | Admitting: Oncology

## 2021-04-28 ENCOUNTER — Other Ambulatory Visit: Payer: Self-pay

## 2021-04-28 ENCOUNTER — Other Ambulatory Visit: Payer: Self-pay | Admitting: Hematology and Oncology

## 2021-04-28 VITALS — BP 121/75 | HR 107 | Temp 97.5°F | Resp 16 | Wt 188.0 lb

## 2021-04-28 VITALS — BP 126/83 | HR 119 | Temp 97.7°F | Resp 14 | Ht 68.0 in | Wt 185.2 lb

## 2021-04-28 DIAGNOSIS — C182 Malignant neoplasm of ascending colon: Secondary | ICD-10-CM

## 2021-04-28 DIAGNOSIS — D702 Other drug-induced agranulocytosis: Secondary | ICD-10-CM

## 2021-04-28 DIAGNOSIS — Z5111 Encounter for antineoplastic chemotherapy: Secondary | ICD-10-CM | POA: Diagnosis not present

## 2021-04-28 LAB — CBC AND DIFFERENTIAL
HCT: 35 — AB (ref 36–46)
Hemoglobin: 11.6 — AB (ref 12.0–16.0)
Neutrophils Absolute: 0.73
Platelets: 138 — AB (ref 150–399)
WBC: 2.9

## 2021-04-28 LAB — HEPATIC FUNCTION PANEL
ALT: 27 (ref 7–35)
AST: 29 (ref 13–35)
Alkaline Phosphatase: 152 — AB (ref 25–125)
Bilirubin, Total: 0.5

## 2021-04-28 LAB — BASIC METABOLIC PANEL
BUN: 10 (ref 4–21)
CO2: 24 — AB (ref 13–22)
Chloride: 107 (ref 99–108)
Creatinine: 1.1 (ref 0.5–1.1)
Glucose: 222
Potassium: 3.5 (ref 3.4–5.3)
Sodium: 141 (ref 137–147)

## 2021-04-28 LAB — COMPREHENSIVE METABOLIC PANEL
Albumin: 4.1 (ref 3.5–5.0)
Calcium: 9.1 (ref 8.7–10.7)

## 2021-04-28 LAB — TOTAL PROTEIN, URINE DIPSTICK: Protein, ur: NEGATIVE mg/dL

## 2021-04-28 LAB — CBC: RBC: 3.52 — AB (ref 3.87–5.11)

## 2021-04-28 MED ORDER — SODIUM CHLORIDE 0.9% FLUSH
10.0000 mL | INTRAVENOUS | Status: DC | PRN
  Administered 2021-04-28: 10 mL

## 2021-04-28 MED ORDER — HEPARIN SOD (PORK) LOCK FLUSH 100 UNIT/ML IV SOLN
500.0000 [IU] | Freq: Once | INTRAVENOUS | Status: AC | PRN
  Administered 2021-04-28: 500 [IU]

## 2021-04-28 MED ORDER — FILGRASTIM-SNDZ 480 MCG/0.8ML IJ SOSY
480.0000 ug | PREFILLED_SYRINGE | Freq: Once | INTRAMUSCULAR | Status: AC
  Administered 2021-04-28: 480 ug via SUBCUTANEOUS
  Filled 2021-04-28: qty 0.8

## 2021-04-28 MED FILL — Dexamethasone Sodium Phosphate Inj 100 MG/10ML: INTRAMUSCULAR | Qty: 1 | Status: AC

## 2021-04-28 MED FILL — Fosaprepitant Dimeglumine For IV Infusion 150 MG (Base Eq): INTRAVENOUS | Qty: 5 | Status: AC

## 2021-04-28 NOTE — Patient Instructions (Signed)
Filgrastim, G-CSF injection What is this medication? FILGRASTIM, G-CSF (fil GRA stim) is a granulocyte colony-stimulating factor that stimulates the growth of neutrophils, a type of white blood cell (WBC) important in the body's fight against infection. It is used to reduce the incidence of fever and infection in patients with certain types of cancer who are receiving chemotherapy that affects the bone marrow, to stimulate blood cell production for removal of WBCs from the body prior to a bone marrow transplantation, to reduce the incidence of fever and infection in patients who have severe chronic neutropenia, and to improve survival outcomes following high-dose radiation exposure that is toxic to the bone marrow. This medicine may be used for other purposes; ask your health care provider or pharmacist if you have questions. COMMON BRAND NAME(S): Neupogen, Nivestym, Releuko, Zarxio What should I tell my care team before I take this medication? They need to know if you have any of these conditions: kidney disease latex allergy ongoing radiation therapy sickle cell disease an unusual or allergic reaction to filgrastim, pegfilgrastim, other medicines, foods, dyes, or preservatives pregnant or trying to get pregnant breast-feeding How should I use this medication? This medicine is for injection under the skin or infusion into a vein. As an infusion into a vein, it is usually given by a health care professional in a hospital or clinic setting. If you get this medicine at home, you will be taught how to prepare and give this medicine. Refer to the Instructions for Use that come with your medication packaging. Use exactly as directed. Take your medicine at regular intervals. Do not take your medicine more often than directed. It is important that you put your used needles and syringes in a special sharps container. Do not put them in a trash can. If you do not have a sharps container, call your pharmacist  or healthcare provider to get one. Talk to your pediatrician regarding the use of this medicine in children. While this drug may be prescribed for children as young as 7 months for selected conditions, precautions do apply. Overdosage: If you think you have taken too much of this medicine contact a poison control center or emergency room at once. NOTE: This medicine is only for you. Do not share this medicine with others. What if I miss a dose? It is important not to miss your dose. Call your doctor or health care professional if you miss a dose. What may interact with this medication? This medicine may interact with the following medications: medicines that may cause a release of neutrophils, such as lithium This list may not describe all possible interactions. Give your health care provider a list of all the medicines, herbs, non-prescription drugs, or dietary supplements you use. Also tell them if you smoke, drink alcohol, or use illegal drugs. Some items may interact with your medicine. What should I watch for while using this medication? Your condition will be monitored carefully while you are receiving this medicine. You may need blood work done while you are taking this medicine. Talk to your health care provider about your risk of cancer. You may be more at risk for certain types of cancer if you take this medicine. What side effects may I notice from receiving this medication? Side effects that you should report to your doctor or health care professional as soon as possible: allergic reactions like skin rash, itching or hives, swelling of the face, lips, or tongue back pain dizziness or feeling faint fever pain, redness, or   irritation at site where injected pinpoint red spots on the skin shortness of breath or breathing problems signs and symptoms of kidney injury like trouble passing urine, change in the amount of urine, or red or dark-brown urine stomach or side pain, or pain at  the shoulder swelling tiredness unusual bleeding or bruising Side effects that usually do not require medical attention (report to your doctor or health care professional if they continue or are bothersome): bone pain cough diarrhea hair loss headache muscle pain This list may not describe all possible side effects. Call your doctor for medical advice about side effects. You may report side effects to FDA at 1-800-FDA-1088. Where should I keep my medication? Keep out of the reach of children. Store in a refrigerator between 2 and 8 degrees C (36 and 46 degrees F). Do not freeze. Keep in carton to protect from light. Throw away this medicine if vials or syringes are left out of the refrigerator for more than 24 hours. Throw away any unused medicine after the expiration date. NOTE: This sheet is a summary. It may not cover all possible information. If you have questions about this medicine, talk to your doctor, pharmacist, or health care provider.  2022 Elsevier/Gold Standard (2021-02-04 00:00:00) vider.  2022 Elsevier/Gold Standard (2021-02-04 00:00:00)

## 2021-04-29 ENCOUNTER — Inpatient Hospital Stay: Payer: PRIVATE HEALTH INSURANCE

## 2021-04-30 ENCOUNTER — Other Ambulatory Visit: Payer: Self-pay | Admitting: Oncology

## 2021-04-30 ENCOUNTER — Encounter: Payer: Self-pay | Admitting: Oncology

## 2021-05-01 ENCOUNTER — Encounter: Payer: Self-pay | Admitting: Oncology

## 2021-05-02 ENCOUNTER — Encounter: Payer: Self-pay | Admitting: Oncology

## 2021-05-02 ENCOUNTER — Other Ambulatory Visit: Payer: Self-pay

## 2021-05-02 ENCOUNTER — Ambulatory Visit
Admission: RE | Admit: 2021-05-02 | Discharge: 2021-05-02 | Disposition: A | Discharge status: Home / Self Care | Payer: No Typology Code available for payment source | Source: Ambulatory Visit | Attending: Internal Medicine | Admitting: Internal Medicine

## 2021-05-02 DIAGNOSIS — Z95828 Presence of other vascular implants and grafts: Secondary | ICD-10-CM

## 2021-05-02 DIAGNOSIS — C7931 Secondary malignant neoplasm of brain: Secondary | ICD-10-CM

## 2021-05-02 MED ORDER — HEPARIN SOD (PORK) LOCK FLUSH 100 UNIT/ML IV SOLN
500.0000 [IU] | Freq: Once | INTRAVENOUS | Status: AC
  Administered 2021-05-02: 500 [IU] via INTRAVENOUS

## 2021-05-02 MED ORDER — SODIUM CHLORIDE 0.9% FLUSH
10.0000 mL | INTRAVENOUS | Status: DC | PRN
  Administered 2021-05-02: 10 mL via INTRAVENOUS

## 2021-05-02 MED ORDER — GADOBENATE DIMEGLUMINE 529 MG/ML IV SOLN
17.0000 mL | Freq: Once | INTRAVENOUS | Status: AC | PRN
  Administered 2021-05-02: 17 mL via INTRAVENOUS

## 2021-05-05 ENCOUNTER — Other Ambulatory Visit: Payer: No Typology Code available for payment source

## 2021-05-05 ENCOUNTER — Inpatient Hospital Stay: Payer: PRIVATE HEALTH INSURANCE | Attending: Oncology

## 2021-05-05 DIAGNOSIS — E119 Type 2 diabetes mellitus without complications: Secondary | ICD-10-CM | POA: Insufficient documentation

## 2021-05-05 DIAGNOSIS — Z803 Family history of malignant neoplasm of breast: Secondary | ICD-10-CM | POA: Insufficient documentation

## 2021-05-05 DIAGNOSIS — C189 Malignant neoplasm of colon, unspecified: Secondary | ICD-10-CM | POA: Insufficient documentation

## 2021-05-05 DIAGNOSIS — Z5112 Encounter for antineoplastic immunotherapy: Secondary | ICD-10-CM | POA: Insufficient documentation

## 2021-05-05 DIAGNOSIS — C7931 Secondary malignant neoplasm of brain: Secondary | ICD-10-CM | POA: Insufficient documentation

## 2021-05-05 DIAGNOSIS — Z90710 Acquired absence of both cervix and uterus: Secondary | ICD-10-CM | POA: Insufficient documentation

## 2021-05-05 DIAGNOSIS — Z5189 Encounter for other specified aftercare: Secondary | ICD-10-CM | POA: Insufficient documentation

## 2021-05-05 DIAGNOSIS — Z5111 Encounter for antineoplastic chemotherapy: Secondary | ICD-10-CM | POA: Insufficient documentation

## 2021-05-06 ENCOUNTER — Other Ambulatory Visit: Payer: Self-pay | Admitting: Hematology and Oncology

## 2021-05-06 ENCOUNTER — Telehealth: Payer: Self-pay | Admitting: Oncology

## 2021-05-06 ENCOUNTER — Other Ambulatory Visit: Payer: Self-pay

## 2021-05-06 ENCOUNTER — Inpatient Hospital Stay: Payer: PRIVATE HEALTH INSURANCE

## 2021-05-06 ENCOUNTER — Inpatient Hospital Stay (HOSPITAL_BASED_OUTPATIENT_CLINIC_OR_DEPARTMENT_OTHER): Payer: PRIVATE HEALTH INSURANCE | Admitting: Internal Medicine

## 2021-05-06 ENCOUNTER — Encounter: Payer: Self-pay | Admitting: Oncology

## 2021-05-06 VITALS — BP 106/69 | HR 116 | Temp 97.9°F | Resp 18 | Ht 68.0 in | Wt 186.2 lb

## 2021-05-06 DIAGNOSIS — Z90710 Acquired absence of both cervix and uterus: Secondary | ICD-10-CM | POA: Diagnosis not present

## 2021-05-06 DIAGNOSIS — C7931 Secondary malignant neoplasm of brain: Secondary | ICD-10-CM | POA: Diagnosis not present

## 2021-05-06 DIAGNOSIS — Z803 Family history of malignant neoplasm of breast: Secondary | ICD-10-CM | POA: Diagnosis not present

## 2021-05-06 DIAGNOSIS — Z5189 Encounter for other specified aftercare: Secondary | ICD-10-CM | POA: Diagnosis not present

## 2021-05-06 DIAGNOSIS — C189 Malignant neoplasm of colon, unspecified: Secondary | ICD-10-CM | POA: Diagnosis present

## 2021-05-06 DIAGNOSIS — Z5111 Encounter for antineoplastic chemotherapy: Secondary | ICD-10-CM | POA: Diagnosis not present

## 2021-05-06 DIAGNOSIS — C182 Malignant neoplasm of ascending colon: Secondary | ICD-10-CM

## 2021-05-06 DIAGNOSIS — E119 Type 2 diabetes mellitus without complications: Secondary | ICD-10-CM | POA: Diagnosis not present

## 2021-05-06 DIAGNOSIS — Z5112 Encounter for antineoplastic immunotherapy: Secondary | ICD-10-CM | POA: Diagnosis present

## 2021-05-06 LAB — BASIC METABOLIC PANEL
BUN: 17 (ref 4–21)
CO2: 25 — AB (ref 13–22)
Chloride: 104 (ref 99–108)
Creatinine: 1.2 — AB (ref 0.5–1.1)
Glucose: 215
Potassium: 4.6 (ref 3.4–5.3)
Sodium: 141 (ref 137–147)

## 2021-05-06 LAB — CBC AND DIFFERENTIAL
HCT: 39 (ref 36–46)
Hemoglobin: 12.5 (ref 12.0–16.0)
Neutrophils Absolute: 2.21
Platelets: 113 — AB (ref 150–399)
WBC: 4.1

## 2021-05-06 LAB — HEPATIC FUNCTION PANEL
ALT: 37 — AB (ref 7–35)
AST: 35 (ref 13–35)
Alkaline Phosphatase: 148 — AB (ref 25–125)
Bilirubin, Total: 0.4

## 2021-05-06 LAB — COMPREHENSIVE METABOLIC PANEL
Albumin: 4.3 (ref 3.5–5.0)
Calcium: 9.2 (ref 8.7–10.7)

## 2021-05-06 LAB — CBC: RBC: 3.85 — AB (ref 3.87–5.11)

## 2021-05-06 MED FILL — Irinotecan HCl Inj 100 MG/5ML (20 MG/ML): INTRAVENOUS | Qty: 14 | Status: AC

## 2021-05-06 MED FILL — Fluorouracil IV Soln 5 GM/100ML (50 MG/ML): INTRAVENOUS | Qty: 100 | Status: AC

## 2021-05-06 MED FILL — Bevacizumab-bvzr IV Soln 400 MG/16ML (For Infusion): INTRAVENOUS | Qty: 18 | Status: AC

## 2021-05-06 MED FILL — Fluorouracil IV Soln 2.5 GM/50ML (50 MG/ML): INTRAVENOUS | Qty: 17 | Status: AC

## 2021-05-06 MED FILL — Fosaprepitant Dimeglumine For IV Infusion 150 MG (Base Eq): INTRAVENOUS | Qty: 5 | Status: AC

## 2021-05-06 MED FILL — Leucovorin Calcium For Inj 350 MG: INTRAMUSCULAR | Qty: 41.8 | Status: AC

## 2021-05-06 MED FILL — Dexamethasone Sodium Phosphate Inj 100 MG/10ML: INTRAMUSCULAR | Qty: 1 | Status: AC

## 2021-05-06 NOTE — Progress Notes (Signed)
[facility] at [place], [place] [address] [phone]   Interval Evaluation  Date of Service: 05/06/21 Patient Name: Shannon [name] Patient MRN: [id number] Patient DOB: 1965/09/26 Provider: Ventura Sellers, MD  Identifying Statement:  Shannon [name] is a 55 y.o. female with Brain metastases [facility]) [C79.31]   Primary Cancer: Colon Adenocarcinoma, Stage IV  Oncologic History: 05/29/20: Sub-occipital craniotomy, resection by Dr. Saintclair Halsted.  Path c/w colon adenocarcinoma 07/08/20: Completes fractionated SRS to resection cavity w/ Dr. Isidore Moos 10/22/20: Salvage SRS R cerebellum  Interval History:  Shannon [name] presents today for follow up after recent MRI brain.  No new or progressive neurologic deficits.  She describes ongoing fatigue, delayed most recent infusion of FOLFIRI because of cytopenias.  Balance is still "not great" but still walking on her own, maintains full independence.  No other changes to medications.  H+P (07/02/20) Patient presented to medical attention at the end of December, 2021 with several weeks of progressive balance impairment.  She worsened toward the end to the point of needing full assist with walking, or being mostly bed-bound.  At the same time she developed bouts of vertigo, described as "room spinning around me", for which medical interventions were not effective.  CNS imaging demonstrated large enhancing mass in left cerebellar hemisphere with swelling and compression c/w likely brain metastasis; she underwent craniotomy and resection on 05/29/20 with Dr. Saintclair Halsted.  Following surgery, her symptoms improved considerably.  At present, she is functionally intact aside from issues with short term memory, fatigue, and mood.  Visited with radiation oncology today for post-operative simulation.   Medications: Current Outpatient Medications on File Prior to Visit  Medication Sig Dispense Refill    acetaminophen (TYLENOL) 325 MG tablet Take 2 tablets (650 mg total) by mouth every 6 (six) hours as needed for mild pain.     atorvastatin (LIPITOR) 10 MG tablet Take by mouth.     CONTOUR NEXT TEST test strip 3 (three) times daily.     Dulaglutide (TRULICITY) 1.5 QA/8.3MH SOPN Inject into the skin.     fluvoxaMINE (LUVOX) 100 MG tablet Take 3 tablets (300 mg total) by mouth at bedtime. 270 tablet 0   hyoscyamine (LEVSIN SL) 0.125 MG SL tablet Place 1 tablet (0.125 mg total) under the tongue every 6 (six) hours as needed. 45 tablet 1   insulin aspart (NOVOLOG FLEXPEN) 100 UNIT/ML FlexPen Inject 8 Units into the skin 3 (three) times daily with meals. 15 mL 11   insulin glargine (LANTUS) 100 UNIT/ML Solostar Pen Inject 26 Units into the skin daily. 15 mL 11   lamoTRIgine (LAMICTAL) 150 MG tablet Take 1 tablet (150 mg total) by mouth daily. 90 tablet 3   levothyroxine (SYNTHROID) 75 MCG tablet Take 1 tablet (75 mcg total) by mouth daily. 30 tablet 0   loratadine (CLARITIN) 10 MG tablet Take 10 mg by mouth daily.     LORazepam (ATIVAN) 0.5 MG tablet Take 0.5 mg by mouth every 8 (eight) hours as needed for anxiety.     metFORMIN (GLUCOPHAGE) 500 MG tablet Take 1 tablet (500 mg total) by mouth 2 (two) times daily with a meal. 60 tablet 0   modafinil (PROVIGIL) 200 MG tablet Take 1.5 tablets (300 mg total) by mouth daily. 45 tablet 1   Multiple Vitamin (MULTI-VITAMIN) tablet Take 1 tablet by mouth daily.     NOVOFINE PEN NEEDLE 32G X 6 MM MISC SMARTSIG:Syringe(s) SUB-Q  Omega-3 1000 MG CAPS Take by mouth.     ondansetron (ZOFRAN) 4 MG tablet Take 1 tablet (4 mg total) by mouth every 4 (four) hours as needed for nausea. 90 tablet 3   pantoprazole (PROTONIX) 40 MG tablet TAKE 1 TABLET BY MOUTH EVERYDAY AT BEDTIME 30 tablet 0   prochlorperazine (COMPAZINE) 10 MG tablet TAKE 1 TABLET BY MOUTH EVERY 6 HOURS AS NEEDED FOR NAUSEA OR VOMITING. 90 tablet 3   senna-docusate (SENOKOT-S) 8.6-50 MG tablet Take  2 tablets by mouth at bedtime.     SEROQUEL 400 MG tablet TAKE 2 TABLETS BY MOUTH AT BEDTIME. BRAND MEDICALLY NECESSARY 180 tablet 0   zolpidem (AMBIEN) 5 MG tablet Take 1 tablet (5 mg total) by mouth at bedtime as needed for sleep. 30 tablet 0   No current facility-administered medications on file prior to visit.    Allergies:  Allergies  Allergen Reactions   Clindamycin/Lincomycin Rash   Penicillins Rash    Reaction: 10 years   Past Medical History:  Past Medical History:  Diagnosis Date   Anemia    Iron De   Anxiety    Blood clot in vein    Bowel obstruction ([place])    Colon cancer ([place]) 2018   treated with surgery and chemotherapy   Depression    Diabetes mellitus without complication ([facility])    Type II   DVT (deep venous thrombosis) ([place]) 2019   behind right knee- while she wasa on chemo   GERD (gastroesophageal reflux disease)    History of blood transfusion    History of chemotherapy    History of kidney stones 2012   passed   Hypothyroidism    Obesity    Past Surgical History:  Past Surgical History:  Procedure Laterality Date   ABDOMINAL HYSTERECTOMY  05/2019   total laparoscopin with tubes and ovariers removed also.   APPENDECTOMY  05/2019   COLON SURGERY  03/2017   Colon resection   COLONOSCOPY  2019   SUBOCCIPITAL CRANIECTOMY CERVICAL LAMINECTOMY Left 05/29/2020   Procedure: Suboccipital Craniectomy for Resection of Left Cerebellar Mass;  Surgeon: Kary Kos, MD;  Location: [place] [facility];  Service: Neurosurgery;  Laterality: Left;   Social History:  Social History   Socioeconomic History   Marital status: Widowed    Spouse name: Not on file   Number of children: Not on file   Years of education: Not on file   Highest education level: Not on file  Occupational History   Occupation: care coordinator   Tobacco Use   Smoking status: Never   Smokeless tobacco: Never  Vaping Use   Vaping Use: Never used  Substance and Sexual Activity   Alcohol use: Yes     Comment: rare   Drug use: Never   Sexual activity: Not Currently  Other Topics Concern   Not on file  Social History Narrative   Not on file   Social Determinants of Health   Financial Resource Strain: Not on file  Food Insecurity: Not on file  Transportation Needs: Not on file  Physical Activity: Not on file  Stress: Not on file  Social Connections: Not on file  Intimate Partner Violence: Not on file   Family History:  Family History  Problem Relation Age of Onset   Irritable bowel syndrome Mother    Colon polyps Father    Breast cancer Maternal Grandmother    Diabetes Maternal Grandmother    Diabetes Maternal Grandfather    Breast cancer Paternal  Grandmother    Colon polyps Maternal Uncle    Stomach cancer Neg Hx    Pancreatic cancer Neg Hx    Esophageal cancer Neg Hx    Colon cancer Neg Hx     Review of Systems: Constitutional: +Fatigue Eyes: Doesn't report blurriness of vision Ears, nose, mouth, throat, and face: Doesn't report sore throat Respiratory: Doesn't report cough, dyspnea or wheezes Cardiovascular: Doesn't report palpitation, chest discomfort  Gastrointestinal:  Doesn't report nausea, constipation, diarrhea GU: Doesn't report incontinence Skin: Doesn't report skin rashes Neurological: Per HPI Musculoskeletal: Doesn't report joint pain Behavioral/Psych: +mood issues, depression  Physical Exam: Vitals:   05/06/21 1042  BP: 106/69  Pulse: (!) 116  Resp: 18  Temp: 97.9 F (36.6 C)  SpO2: 98%   KPS: 90. General: Alert, cooperative, pleasant, in no acute distress Head: Normal EENT: No conjunctival injection or scleral icterus.  Lungs: Resp effort normal Cardiac: Regular rate Abdomen: Non-distended abdomen Skin: No rashes cyanosis or petechiae. Extremities: No clubbing or edema  Neurologic Exam: Mental Status: Awake, alert, attentive to examiner. Oriented to self and environment. Language is fluent with intact comprehension.  Cranial Nerves:  Visual acuity is grossly normal. Visual fields are full. Extra-ocular movements intact. No ptosis. Face is symmetric Motor: Tone and bulk are normal. Power is full in both arms and legs. Reflexes are symmetric, no pathologic reflexes present.  Sensory: Intact to light touch Gait: Normal.   Labs: I have reviewed the data as listed    Component Value Date/Time   NA 141 04/28/2021 0000   K 3.5 04/28/2021 0000   CL 107 04/28/2021 0000   CO2 24 (A) 04/28/2021 0000   GLUCOSE 234 (H) 06/07/2020 0525   BUN 10 04/28/2021 0000   CREATININE 1.1 04/28/2021 0000   CREATININE 0.97 06/07/2020 0525   CALCIUM 9.1 04/28/2021 0000   PROT 5.2 (L) 06/07/2020 0525   ALBUMIN 4.1 04/28/2021 0000   AST 29 04/28/2021 0000   ALT 27 04/28/2021 0000   ALKPHOS 152 (A) 04/28/2021 0000   BILITOT 0.3 06/07/2020 0525   GFRNONAA >60 06/07/2020 0525   Lab Results  Component Value Date   WBC 2.9 04/28/2021   NEUTROABS 0.73 04/28/2021   HGB 11.6 (A) 04/28/2021   HCT 35 (A) 04/28/2021   MCV 101 (A) 04/14/2021   PLT 138 (A) 04/28/2021   Imaging:  [place] Clinician Interpretation: I have personally reviewed the CNS images as listed.  My interpretation, in the context of the patient's clinical presentation, is stable disease  MR BRAIN W WO CONTRAST  Result Date: 05/03/2021 CLINICAL DATA:  Brain/CNS neoplasm, assess treatment response; metastatic colon cancer EXAM: MRI HEAD WITHOUT AND WITH CONTRAST TECHNIQUE: Multiplanar, multiecho pulse sequences of the brain and surrounding structures were obtained without and with intravenous contrast. CONTRAST:  75mL MULTIHANCE GADOBENATE DIMEGLUMINE 529 MG/ML IV SOLN Contrast was administered via a port which was accessed by a Equities trader. COMPARISON:  01/10/2021 FINDINGS: Brain: Postoperative changes are again identified in the left posterior fossa with encephalomalacia and chronic blood products. Similar fluid collection extending suboccipitally. Enhancement at the  operative site is stable. Slightly decreased right cerebellar enhancing lesion now measuring 4 mm (previously 5 mm). There is no new mass or abnormal enhancement. There is no acute infarction or intracranial hemorrhage. There is no hydrocephalus or extra-axial fluid collection. Ventricles and sulci are stable in size and configuration. Vascular: Major vessel flow voids at the skull base are preserved. Skull and upper cervical spine: Normal marrow signal  is preserved. Sinuses/Orbits: Mild mucosal thickening.  Orbits are unremarkable. Other: Sella is unremarkable.  Mastoid air cells are clear. IMPRESSION: Stable postoperative appearance. Slightly smaller right cerebellar lesion. No evidence of new or progressive metastatic disease. Electronically Signed   By: Macy Mis M.D.   On: 05/03/2021 10:36    Assessment/Plan Brain metastases [facility]) [C79.31]   Shannon [name] is clinically and radiographically stable today.    No new or progressive deficits, resection cavity is also stable.  We appreciate the opportunity to participate in the care of Shannon [name].  She will continue to follow with Dr. Bobby Rumpf for systemic therapy, currently FOLFIRI+avastin.  We ask that Shannon [name] return to clinic in 3 months with brain MRI, or sooner as needed.  All questions were answered. The patient knows to call the clinic with any problems, questions or concerns. No barriers to learning were detected.  The total time spent in the encounter was 30 minutes and more than 50% was on counseling and review of test results   Ventura Sellers, MD Medical Director of Neuro-Oncology [facility] at [place] 05/06/21 10:46 AM

## 2021-05-06 NOTE — Telephone Encounter (Signed)
Per 12/6 Staff Msg, 12/5 Labs rescheduled to 12/6 at 1:00 pm - Patient notified

## 2021-05-07 ENCOUNTER — Other Ambulatory Visit: Payer: Self-pay | Admitting: Hematology and Oncology

## 2021-05-07 ENCOUNTER — Telehealth: Payer: Self-pay | Admitting: Internal Medicine

## 2021-05-07 ENCOUNTER — Inpatient Hospital Stay: Payer: PRIVATE HEALTH INSURANCE

## 2021-05-07 VITALS — BP 107/71 | HR 103 | Temp 97.8°F | Resp 18 | Ht 68.0 in | Wt 186.0 lb

## 2021-05-07 DIAGNOSIS — C182 Malignant neoplasm of ascending colon: Secondary | ICD-10-CM

## 2021-05-07 DIAGNOSIS — Z5111 Encounter for antineoplastic chemotherapy: Secondary | ICD-10-CM | POA: Diagnosis not present

## 2021-05-07 LAB — TOTAL PROTEIN, URINE DIPSTICK: Protein, ur: NEGATIVE mg/dL

## 2021-05-07 MED ORDER — SODIUM CHLORIDE 0.9 % IV SOLN
2400.0000 mg/m2 | INTRAVENOUS | Status: DC
  Administered 2021-05-07: 5000 mg via INTRAVENOUS
  Filled 2021-05-07: qty 100

## 2021-05-07 MED ORDER — SODIUM CHLORIDE 0.9 % IV SOLN: Freq: Once | INTRAVENOUS | Status: AC

## 2021-05-07 MED ORDER — SODIUM CHLORIDE 0.9 % IV SOLN: Freq: Once | INTRAVENOUS | Status: DC

## 2021-05-07 MED ORDER — LORAZEPAM 2 MG/ML IJ SOLN
1.0000 mg | Freq: Once | INTRAMUSCULAR | Status: AC
  Administered 2021-05-07: 1 mg via INTRAVENOUS
  Filled 2021-05-07: qty 1

## 2021-05-07 MED ORDER — FLUOROURACIL CHEMO INJECTION 2.5 GM/50ML
400.0000 mg/m2 | Freq: Once | INTRAVENOUS | Status: AC
  Administered 2021-05-07: 850 mg via INTRAVENOUS
  Filled 2021-05-07: qty 17

## 2021-05-07 MED ORDER — SODIUM CHLORIDE 0.9 % IV SOLN
5.0000 mg/kg | Freq: Once | INTRAVENOUS | Status: AC
  Administered 2021-05-07: 450 mg via INTRAVENOUS
  Filled 2021-05-07: qty 16

## 2021-05-07 MED ORDER — HEPARIN SOD (PORK) LOCK FLUSH 100 UNIT/ML IV SOLN: 500.0000 [IU] | Freq: Once | INTRAVENOUS | Status: DC | PRN

## 2021-05-07 MED ORDER — IRINOTECAN HCL CHEMO INJECTION 100 MG/5ML
135.0000 mg/m2 | Freq: Once | INTRAVENOUS | Status: AC
  Administered 2021-05-07: 280 mg via INTRAVENOUS
  Filled 2021-05-07: qty 10

## 2021-05-07 MED ORDER — SODIUM CHLORIDE 0.9% FLUSH: 10.0000 mL | INTRAVENOUS | Status: DC | PRN

## 2021-05-07 MED ORDER — SODIUM CHLORIDE 0.9 % IV SOLN
150.0000 mg | Freq: Once | INTRAVENOUS | Status: AC
  Administered 2021-05-07: 150 mg via INTRAVENOUS
  Filled 2021-05-07: qty 150

## 2021-05-07 MED ORDER — PALONOSETRON HCL INJECTION 0.25 MG/5ML
0.2500 mg | Freq: Once | INTRAVENOUS | Status: AC
  Administered 2021-05-07: 0.25 mg via INTRAVENOUS
  Filled 2021-05-07: qty 5

## 2021-05-07 MED ORDER — ATROPINE SULFATE 1 MG/ML IV SOLN
0.5000 mg | Freq: Once | INTRAVENOUS | Status: AC | PRN
  Administered 2021-05-07: 0.5 mg via INTRAVENOUS
  Filled 2021-05-07: qty 1

## 2021-05-07 MED ORDER — LEUCOVORIN CALCIUM INJECTION 350 MG
400.0000 mg/m2 | Freq: Once | INTRAVENOUS | Status: AC
  Administered 2021-05-07: 836 mg via INTRAVENOUS
  Filled 2021-05-07: qty 41.8

## 2021-05-07 MED ORDER — SODIUM CHLORIDE 0.9 % IV SOLN
10.0000 mg | Freq: Once | INTRAVENOUS | Status: AC
  Administered 2021-05-07: 10 mg via INTRAVENOUS
  Filled 2021-05-07: qty 10

## 2021-05-07 NOTE — Telephone Encounter (Signed)
Scheduled per 12/6 los, message has been left and will also mail calender

## 2021-05-07 NOTE — Patient Instructions (Signed)
Irinotecan injection What is this medication? IRINOTECAN (ir in oh TEE kan ) is a chemotherapy drug. It is used to treat colon and rectal cancer. This medicine may be used for other purposes; ask your health care provider or pharmacist if you have questions. COMMON BRAND NAME(S): Camptosar What should I tell my care team before I take this medication? They need to know if you have any of these conditions: dehydration diarrhea infection (especially a virus infection such as chickenpox, cold sores, or herpes) liver disease low blood counts, like low white cell, platelet, or red cell counts low levels of calcium, magnesium, or potassium in the blood recent or ongoing radiation therapy an unusual or allergic reaction to irinotecan, other medicines, foods, dyes, or preservatives pregnant or trying to get pregnant breast-feeding How should I use this medication? This drug is given as an infusion into a vein. It is administered in a hospital or clinic by a specially trained health care professional. Talk to your pediatrician regarding the use of this medicine in children. Special care may be needed. Overdosage: If you think you have taken too much of this medicine contact a poison control center or emergency room at once. NOTE: This medicine is only for you. Do not share this medicine with others. What if I miss a dose? It is important not to miss your dose. Call your doctor or health care professional if you are unable to keep an appointment. What may interact with this medication? Do not take this medicine with any of the following medications: cobicistat itraconazole This medicine may interact with the following medications: antiviral medicines for HIV or AIDS certain antibiotics like rifampin or rifabutin certain medicines for fungal infections like ketoconazole, posaconazole, and voriconazole certain medicines for seizures like carbamazepine, phenobarbital,  phenotoin clarithromycin gemfibrozil nefazodone [facility]. John's Wort This list may not describe all possible interactions. Give your health care provider a list of all the medicines, herbs, non-prescription drugs, or dietary supplements you use. Also tell them if you smoke, drink alcohol, or use illegal drugs. Some items may interact with your medicine. What should I watch for while using this medication? Your condition will be monitored carefully while you are receiving this medicine. You will need important blood work done while you are taking this medicine. This drug may make you feel generally unwell. This is not uncommon, as chemotherapy can affect healthy cells as well as cancer cells. Report any side effects. Continue your course of treatment even though you feel ill unless your doctor tells you to stop. In some cases, you may be given additional medicines to help with side effects. Follow all directions for their use. You may get drowsy or dizzy. Do not drive, use machinery, or do anything that needs mental alertness until you know how this medicine affects you. Do not stand or sit up quickly, especially if you are an older patient. This reduces the risk of dizzy or fainting spells. Call your health care professional for advice if you get a fever, chills, or sore throat, or other symptoms of a cold or flu. Do not treat yourself. This medicine decreases your body's ability to fight infections. Try to avoid being around people who are sick. Avoid taking products that contain aspirin, acetaminophen, ibuprofen, naproxen, or ketoprofen unless instructed by your doctor. These medicines may hide a fever. This medicine may increase your risk to bruise or bleed. Call your doctor or health care professional if you notice any unusual bleeding. Be careful brushing and  flossing your teeth or using a toothpick because you may get an infection or bleed more easily. If you have any dental work done, tell your  dentist you are receiving this medicine. Do not become pregnant while taking this medicine or for 6 months after stopping it. Women should inform their health care professional if they wish to become pregnant or think they might be pregnant. Men should not father a child while taking this medicine and for 3 months after stopping it. There is potential for serious side effects to an unborn child. Talk to your health care professional for more information. Do not breast-feed an infant while taking this medicine or for 7 days after stopping it. This medicine has caused ovarian failure in some women. This medicine may make it more difficult to get pregnant. Talk to your health care professional if you are concerned about your fertility. This medicine has caused decreased sperm counts in some men. This may make it more difficult to father a child. Talk to your health care professional if you are concerned about your fertility. What side effects may I notice from receiving this medication? Side effects that you should report to your doctor or health care professional as soon as possible: allergic reactions like skin rash, itching or hives, swelling of the face, lips, or tongue chest pain diarrhea flushing, runny nose, sweating during infusion low blood counts - this medicine may decrease the number of white blood cells, red blood cells and platelets. You may be at increased risk for infections and bleeding. nausea, vomiting pain, swelling, warmth in the leg signs of decreased platelets or bleeding - bruising, pinpoint red spots on the skin, black, tarry stools, blood in the urine signs of infection - fever or chills, cough, sore throat, pain or difficulty passing urine signs of decreased red blood cells - unusually weak or tired, fainting spells, lightheadedness Side effects that usually do not require medical attention (report to your doctor or health care professional if they continue or are  bothersome): constipation hair loss headache loss of appetite mouth sores stomach pain This list may not describe all possible side effects. Call your doctor for medical advice about side effects. You may report side effects to FDA at 1-800-FDA-1088. Where should I keep my medication? This drug is given in a hospital or clinic and will not be stored at home. NOTE: This sheet is a summary. It may not cover all possible information. If you have questions about this medicine, talk to your doctor, pharmacist, or health care provider.  2022 Elsevier/Gold Standard (2021-02-04 00:00:00) Leucovorin injection What is this medication? LEUCOVORIN (loo koe VOR in) is used to prevent or treat the harmful effects of some medicines. This medicine is used to treat anemia caused by a low amount of folic acid in the body. It is also used with 5-fluorouracil (5-FU) to treat colon cancer. This medicine may be used for other purposes; ask your health care provider or pharmacist if you have questions. What should I tell my care team before I take this medication? They need to know if you have any of these conditions: anemia from low levels of vitamin B-12 in the blood an unusual or allergic reaction to leucovorin, folic acid, other medicines, foods, dyes, or preservatives pregnant or trying to get pregnant breast-feeding How should I use this medication? This medicine is for injection into a muscle or into a vein. It is given by a health care professional in a hospital or clinic setting.  Talk to your pediatrician regarding the use of this medicine in children. Special care may be needed. Overdosage: If you think you have taken too much of this medicine contact a poison control center or emergency room at once. NOTE: This medicine is only for you. Do not share this medicine with others. What if I miss a dose? This does not apply. What may interact with this  medication? capecitabine fluorouracil phenobarbital phenytoin primidone trimethoprim-sulfamethoxazole This list may not describe all possible interactions. Give your health care provider a list of all the medicines, herbs, non-prescription drugs, or dietary supplements you use. Also tell them if you smoke, drink alcohol, or use illegal drugs. Some items may interact with your medicine. What should I watch for while using this medication? Your condition will be monitored carefully while you are receiving this medicine. This medicine may increase the side effects of 5-fluorouracil, 5-FU. Tell your doctor or health care professional if you have diarrhea or mouth sores that do not get better or that get worse. What side effects may I notice from receiving this medication? Side effects that you should report to your doctor or health care professional as soon as possible: allergic reactions like skin rash, itching or hives, swelling of the face, lips, or tongue breathing problems fever, infection mouth sores unusual bleeding or bruising unusually weak or tired Side effects that usually do not require medical attention (report to your doctor or health care professional if they continue or are bothersome): constipation or diarrhea loss of appetite nausea, vomiting This list may not describe all possible side effects. Call your doctor for medical advice about side effects. You may report side effects to FDA at 1-800-FDA-1088. Where should I keep my medication? This drug is given in a hospital or clinic and will not be stored at home. NOTE: This sheet is a summary. It may not cover all possible information. If you have questions about this medicine, talk to your doctor, pharmacist, or health care provider.  2022 Elsevier/Gold Standard (2007-11-24 00:00:00) Bevacizumab injection What is this medication? BEVACIZUMAB (be va SIZ yoo mab) is a monoclonal antibody. It is used to treat many types of  cancer. This medicine may be used for other purposes; ask your health care provider or pharmacist if you have questions. COMMON BRAND NAME(S): Alymsys, Avastin, MVASI, Noah Charon What should I tell my care team before I take this medication? They need to know if you have any of these conditions: diabetes heart disease high blood pressure history of coughing up blood prior anthracycline chemotherapy (e.g., doxorubicin, daunorubicin, epirubicin) recent or ongoing radiation therapy recent or planning to have surgery stroke an unusual or allergic reaction to bevacizumab, hamster proteins, mouse proteins, other medicines, foods, dyes, or preservatives pregnant or trying to get pregnant breast-feeding How should I use this medication? This medicine is for infusion into a vein. It is given by a health care professional in a hospital or clinic setting. Talk to your pediatrician regarding the use of this medicine in children. Special care may be needed. Overdosage: If you think you have taken too much of this medicine contact a poison control center or emergency room at once. NOTE: This medicine is only for you. Do not share this medicine with others. What if I miss a dose? It is important not to miss your dose. Call your doctor or health care professional if you are unable to keep an appointment. What may interact with this medication? Interactions are not expected. This list may not  describe all possible interactions. Give your health care provider a list of all the medicines, herbs, non-prescription drugs, or dietary supplements you use. Also tell them if you smoke, drink alcohol, or use illegal drugs. Some items may interact with your medicine. What should I watch for while using this medication? Your condition will be monitored carefully while you are receiving this medicine. You will need important blood work and urine testing done while you are taking this medicine. This medicine may increase  your risk to bruise or bleed. Call your doctor or health care professional if you notice any unusual bleeding. Before having surgery, talk to your health care provider to make sure it is ok. This drug can increase the risk of poor healing of your surgical site or wound. You will need to stop this drug for 28 days before surgery. After surgery, wait at least 28 days before restarting this drug. Make sure the surgical site or wound is healed enough before restarting this drug. Talk to your health care provider if questions. Do not become pregnant while taking this medicine or for 6 months after stopping it. Women should inform their doctor if they wish to become pregnant or think they might be pregnant. There is a potential for serious side effects to an unborn child. Talk to your health care professional or pharmacist for more information. Do not breast-feed an infant while taking this medicine and for 6 months after the last dose. This medicine has caused ovarian failure in some women. This medicine may interfere with the ability to have a child. You should talk to your doctor or health care professional if you are concerned about your fertility. What side effects may I notice from receiving this medication? Side effects that you should report to your doctor or health care professional as soon as possible: allergic reactions like skin rash, itching or hives, swelling of the face, lips, or tongue chest pain or chest tightness chills coughing up blood high fever seizures severe constipation signs and symptoms of bleeding such as bloody or black, tarry stools; red or dark-brown urine; spitting up blood or brown material that looks like coffee grounds; red spots on the skin; unusual bruising or bleeding from the eye, gums, or nose signs and symptoms of a blood clot such as breathing problems; chest pain; severe, sudden headache; pain, swelling, warmth in the leg signs and symptoms of a stroke like changes  in vision; confusion; trouble speaking or understanding; severe headaches; sudden numbness or weakness of the face, arm or leg; trouble walking; dizziness; loss of balance or coordination stomach pain sweating swelling of legs or ankles vomiting weight gain Side effects that usually do not require medical attention (report to your doctor or health care professional if they continue or are bothersome): back pain changes in taste decreased appetite dry skin nausea tiredness This list may not describe all possible side effects. Call your doctor for medical advice about side effects. You may report side effects to FDA at 1-800-FDA-1088. Where should I keep my medication? This drug is given in a hospital or clinic and will not be stored at home. NOTE: This sheet is a summary. It may not cover all possible information. If you have questions about this medicine, talk to your doctor, pharmacist, or health care provider.  2022 Elsevier/Gold Standard (2021-02-04 00:00:00)

## 2021-05-09 ENCOUNTER — Telehealth: Payer: Self-pay | Admitting: Psychiatry

## 2021-05-09 ENCOUNTER — Other Ambulatory Visit: Payer: Self-pay

## 2021-05-09 ENCOUNTER — Inpatient Hospital Stay: Payer: PRIVATE HEALTH INSURANCE

## 2021-05-09 VITALS — BP 124/84 | HR 98 | Temp 97.7°F | Resp 18 | Wt 189.0 lb

## 2021-05-09 DIAGNOSIS — Z5111 Encounter for antineoplastic chemotherapy: Secondary | ICD-10-CM | POA: Diagnosis not present

## 2021-05-09 DIAGNOSIS — C182 Malignant neoplasm of ascending colon: Secondary | ICD-10-CM

## 2021-05-09 MED ORDER — SODIUM CHLORIDE 0.9% FLUSH
10.0000 mL | INTRAVENOUS | Status: DC | PRN
  Administered 2021-05-09: 10 mL

## 2021-05-09 MED ORDER — HEPARIN SOD (PORK) LOCK FLUSH 100 UNIT/ML IV SOLN
500.0000 [IU] | Freq: Once | INTRAVENOUS | Status: AC | PRN
  Administered 2021-05-09: 500 [IU]

## 2021-05-09 NOTE — Progress Notes (Signed)
[facility]  [address] [place],  [place]  [address] [phone]  Clinic Day:  05/19/2021  Referring physician: Greig Right, MD  This document serves as a record of services personally performed by Paiton Fosco Macarthur Critchley, MD. It was created on their behalf by [facility] E, a trained medical scribe. The creation of this record is based on the scribe's personal observations and the provider's statements to them.  HISTORY OF PRESENT ILLNESS:  The patient is a 55 y.o. female with metastatic colon cancer, which includes spread of disease to her abdominal cavity, lungs, brain and right adrenal gland. She comes in today prior to her 21st cycle of FOLFIRI/Avastin.  The patient claims to have toleraed her 20th cycle of treatment fairly well.  Her diarrhea remains prominent, for which she receives atropine to offset the effects of irinotecan.  She also uses over-the-counter antidiarrheal therapy.  Overall, she is doing well.  She denies having any new symptoms/findings which concern her for disease progression.     With respect to her colon cancer history, she is status post a right hemicolectomy in early October 2018, followed by 12 cycles of adjuvant chemotherapy, which were completed in April 2019.  In December 2021, she underwent a left cerebellar metastatectomy, whose pathology was consistent with metastatic colon cancer.  Tumor testing did come back MMR normal.  CT scans recently showed evidence of her cancer being in multiple locations, which has her on palliative chemotherapy at this time.    PHYSICAL EXAM:  Blood pressure 120/75, pulse (!) 105, temperature 97.9 F (36.6 C), resp. rate 16, height 5' 8"  (1.727 m), weight 187 lb 14.4 oz (85.2 kg), last menstrual period 02/22/2019, SpO2 97 %. Wt Readings from Last 3 Encounters:  05/19/21 187 lb 14.4 oz (85.2 kg)  05/09/21 189 lb (85.7 kg)  05/07/21 186 lb 0.6 oz (84.4 kg)   Body mass index is 28.57  kg/m. Performance status (ECOG): 1 Physical Exam Constitutional:      General: She is not in acute distress.    Appearance: Normal appearance. She is normal weight. She is not ill-appearing.  HENT:     Head: Normocephalic and atraumatic.  Eyes:     General: No scleral icterus.    Extraocular Movements: Extraocular movements intact.     Conjunctiva/sclera: Conjunctivae normal.     Pupils: Pupils are equal, round, and reactive to light.  Cardiovascular:     Rate and Rhythm: Regular rhythm. Tachycardia present.     Pulses: Normal pulses.     Heart sounds: Normal heart sounds. No murmur heard.   No friction rub. No gallop.  Pulmonary:     Effort: Pulmonary effort is normal. No respiratory distress.     Breath sounds: Normal breath sounds.  Abdominal:     General: Bowel sounds are normal. There is no distension.     Palpations: Abdomen is soft. There is no hepatomegaly, splenomegaly or mass.     Tenderness: There is no abdominal tenderness.  Musculoskeletal:        General: Normal range of motion.     Cervical back: Normal range of motion and neck supple.     Right lower leg: No edema.     Left lower leg: No edema.  Lymphadenopathy:     Cervical: No cervical adenopathy.  Skin:    General: Skin is warm and dry.  Neurological:     General: No focal deficit present.     Mental Status: She  is alert and oriented to person, place, and time. Mental status is at baseline.  Psychiatric:        Mood and Affect: Mood normal.        Behavior: Behavior normal.        Thought Content: Thought content normal.        Judgment: Judgment normal.    LABS:    Latest Reference Range & Units 05/19/21 00:00  Sodium 137 - 147  141 (E)  Potassium 3.4 - 5.3  3.7 (E)  Chloride 99 - 108  107 (E)  CO2 13 - 22  24 ! (E)  Glucose  172 (E)  BUN 4 - 21  12 (E)  Creatinine 0.5 - 1.1  1.2 ! (E)  Calcium 8.7 - 10.7  8.8 (E)  Alkaline Phosphatase 25 - 125  153 ! (E)  Albumin 3.5 - 5.0  4.0 (E)  AST  13 - 35  45 ! (E)  ALT 7 - 35  41 ! (E)  Bilirubin, Total  0.4 (E)  WBC  2.6 (E)  RBC 3.87 - 5.11  3.64 ! (E)  Hemoglobin 12.0 - 16.0  11.7 ! (E)  HCT 36 - 46  36 (E)  Platelets 150 - 399  114 ! (E)  NEUT#  1.27 (E)  !: Data is abnormal (E): External lab result  ASSESSMENT & PLAN:  Assessment/Plan:  A 55 y.o. female with metastatic colon cancer. She will proceed with her 21st cycle of FOLFIRI/Avastin this week.  I will give her Zarxio 480 mcg for 2 days during the middle of this next cycle of treatment to help hasten her white count recovery in preparation for her next cycle of chemotherapy.  Clinically, she appears to be doing well.  I will see her back in 2 weeks before she heads into her 22nd cycle of FOLFIRI/Avastin. The patient understands all the plans discussed today and is in agreement with them.     I, Rita Ohara, am acting as scribe for Marice Potter, MD    I have reviewed this report as typed by the medical scribe, and it is complete and accurate.  Kimmberly Wisser Macarthur Critchley, MD

## 2021-05-09 NOTE — Telephone Encounter (Signed)
Patient lm inquiring about the instructions to taper off the Seroquel, and introduce the new medication. She was last seen in the office on 11/21. Please advise. # [phone].

## 2021-05-09 NOTE — Telephone Encounter (Signed)
LVM to rtc 

## 2021-05-09 NOTE — Patient Instructions (Signed)
Fluorouracil, 5-FU injection What is this medication? FLUOROURACIL, 5-FU (flure oh YOOR a sil) is a chemotherapy drug. It slows the growth of cancer cells. This medicine is used to treat many types of cancer like breast cancer, colon or rectal cancer, pancreatic cancer, and stomach cancer. This medicine may be used for other purposes; ask your health care provider or pharmacist if you have questions. COMMON BRAND NAME(S): Adrucil What should I tell my care team before I take this medication? They need to know if you have any of these conditions: blood disorders dihydropyrimidine dehydrogenase (DPD) deficiency infection (especially a virus infection such as chickenpox, cold sores, or herpes) kidney disease liver disease malnourished, poor nutrition recent or ongoing radiation therapy an unusual or allergic reaction to fluorouracil, other chemotherapy, other medicines, foods, dyes, or preservatives pregnant or trying to get pregnant breast-feeding How should I use this medication? This drug is given as an infusion or injection into a vein. It is administered in a hospital or clinic by a specially trained health care professional. Talk to your pediatrician regarding the use of this medicine in children. Special care may be needed. Overdosage: If you think you have taken too much of this medicine contact a poison control center or emergency room at once. NOTE: This medicine is only for you. Do not share this medicine with others. What if I miss a dose? It is important not to miss your dose. Call your doctor or health care professional if you are unable to keep an appointment. What may interact with this medication? Do not take this medicine with any of the following medications: live virus vaccines This medicine may also interact with the following medications: medicines that treat or prevent blood clots like warfarin, enoxaparin, and dalteparin This list may not describe all possible  interactions. Give your health care provider a list of all the medicines, herbs, non-prescription drugs, or dietary supplements you use. Also tell them if you smoke, drink alcohol, or use illegal drugs. Some items may interact with your medicine. What should I watch for while using this medication? Visit your doctor for checks on your progress. This drug may make you feel generally unwell. This is not uncommon, as chemotherapy can affect healthy cells as well as cancer cells. Report any side effects. Continue your course of treatment even though you feel ill unless your doctor tells you to stop. In some cases, you may be given additional medicines to help with side effects. Follow all directions for their use. Call your doctor or health care professional for advice if you get a fever, chills or sore throat, or other symptoms of a cold or flu. Do not treat yourself. This drug decreases your body's ability to fight infections. Try to avoid being around people who are sick. This medicine may increase your risk to bruise or bleed. Call your doctor or health care professional if you notice any unusual bleeding. Be careful brushing and flossing your teeth or using a toothpick because you may get an infection or bleed more easily. If you have any dental work done, tell your dentist you are receiving this medicine. Avoid taking products that contain aspirin, acetaminophen, ibuprofen, naproxen, or ketoprofen unless instructed by your doctor. These medicines may hide a fever. Do not become pregnant while taking this medicine. Women should inform their doctor if they wish to become pregnant or think they might be pregnant. There is a potential for serious side effects to an unborn child. Talk to your health care   professional or pharmacist for more information. Do not breast-feed an infant while taking this medicine. Men should inform their doctor if they wish to father a child. This medicine may lower sperm  counts. Do not treat diarrhea with over the counter products. Contact your doctor if you have diarrhea that lasts more than 2 days or if it is severe and watery. This medicine can make you more sensitive to the sun. Keep out of the sun. If you cannot avoid being in the sun, wear protective clothing and use sunscreen. Do not use sun lamps or tanning beds/booths. What side effects may I notice from receiving this medication? Side effects that you should report to your doctor or health care professional as soon as possible: allergic reactions like skin rash, itching or hives, swelling of the face, lips, or tongue low blood counts - this medicine may decrease the number of white blood cells, red blood cells and platelets. You may be at increased risk for infections and bleeding. signs of infection - fever or chills, cough, sore throat, pain or difficulty passing urine signs of decreased platelets or bleeding - bruising, pinpoint red spots on the skin, black, tarry stools, blood in the urine signs of decreased red blood cells - unusually weak or tired, fainting spells, lightheadedness breathing problems changes in vision chest pain mouth sores nausea and vomiting pain, swelling, redness at site where injected pain, tingling, numbness in the hands or feet redness, swelling, or sores on hands or feet stomach pain unusual bleeding Side effects that usually do not require medical attention (report to your doctor or health care professional if they continue or are bothersome): changes in finger or toe nails diarrhea dry or itchy skin hair loss headache loss of appetite sensitivity of eyes to the light stomach upset unusually teary eyes This list may not describe all possible side effects. Call your doctor for medical advice about side effects. You may report side effects to FDA at 1-800-FDA-1088. Where should I keep my medication? This drug is given in a hospital or clinic and will not be  stored at home. NOTE: This sheet is a summary. It may not cover all possible information. If you have questions about this medicine, talk to your doctor, pharmacist, or health care provider.  2022 [employer] (2021-02-04 00:00:00)  

## 2021-05-12 NOTE — Telephone Encounter (Signed)
Still no answer from pt

## 2021-05-13 ENCOUNTER — Encounter: Payer: Self-pay | Admitting: Oncology

## 2021-05-19 ENCOUNTER — Other Ambulatory Visit: Payer: Self-pay | Admitting: Pharmacist

## 2021-05-19 ENCOUNTER — Inpatient Hospital Stay (INDEPENDENT_AMBULATORY_CARE_PROVIDER_SITE_OTHER): Payer: PRIVATE HEALTH INSURANCE | Admitting: Oncology

## 2021-05-19 ENCOUNTER — Other Ambulatory Visit: Payer: Self-pay | Admitting: Hematology and Oncology

## 2021-05-19 ENCOUNTER — Inpatient Hospital Stay: Payer: PRIVATE HEALTH INSURANCE

## 2021-05-19 VITALS — BP 120/75 | HR 105 | Temp 97.9°F | Resp 16 | Ht 68.0 in | Wt 187.9 lb

## 2021-05-19 DIAGNOSIS — Z5111 Encounter for antineoplastic chemotherapy: Secondary | ICD-10-CM | POA: Diagnosis not present

## 2021-05-19 DIAGNOSIS — C182 Malignant neoplasm of ascending colon: Secondary | ICD-10-CM | POA: Diagnosis not present

## 2021-05-19 DIAGNOSIS — C183 Malignant neoplasm of hepatic flexure: Secondary | ICD-10-CM

## 2021-05-19 LAB — CBC AND DIFFERENTIAL
HCT: 36 (ref 36–46)
Hemoglobin: 11.7 — AB (ref 12.0–16.0)
Neutrophils Absolute: 1.27
Platelets: 114 — AB (ref 150–399)
WBC: 2.6

## 2021-05-19 LAB — BASIC METABOLIC PANEL
BUN: 12 (ref 4–21)
CO2: 24 — AB (ref 13–22)
Chloride: 107 (ref 99–108)
Creatinine: 1.2 — AB (ref 0.5–1.1)
Glucose: 172
Potassium: 3.7 (ref 3.4–5.3)
Sodium: 141 (ref 137–147)

## 2021-05-19 LAB — COMPREHENSIVE METABOLIC PANEL
Albumin: 4 (ref 3.5–5.0)
Calcium: 8.8 (ref 8.7–10.7)

## 2021-05-19 LAB — CBC: RBC: 3.64 — AB (ref 3.87–5.11)

## 2021-05-19 LAB — HEPATIC FUNCTION PANEL
ALT: 41 — AB (ref 7–35)
AST: 45 — AB (ref 13–35)
Alkaline Phosphatase: 153 — AB (ref 25–125)
Bilirubin, Total: 0.4

## 2021-05-19 LAB — TOTAL PROTEIN, URINE DIPSTICK: Protein, ur: 30 mg/dL — AB

## 2021-05-20 ENCOUNTER — Inpatient Hospital Stay: Payer: PRIVATE HEALTH INSURANCE

## 2021-05-20 ENCOUNTER — Other Ambulatory Visit: Payer: Self-pay

## 2021-05-20 ENCOUNTER — Ambulatory Visit: Payer: No Typology Code available for payment source | Admitting: Oncology

## 2021-05-20 ENCOUNTER — Other Ambulatory Visit: Payer: No Typology Code available for payment source

## 2021-05-20 VITALS — BP 117/81 | HR 102 | Temp 97.5°F | Resp 18 | Ht 68.0 in | Wt 188.0 lb

## 2021-05-20 DIAGNOSIS — Z5111 Encounter for antineoplastic chemotherapy: Secondary | ICD-10-CM | POA: Diagnosis not present

## 2021-05-20 DIAGNOSIS — C182 Malignant neoplasm of ascending colon: Secondary | ICD-10-CM

## 2021-05-20 MED ORDER — ATROPINE SULFATE 1 MG/ML IV SOLN
0.5000 mg | Freq: Once | INTRAVENOUS | Status: AC | PRN
  Administered 2021-05-20: 0.5 mg via INTRAVENOUS
  Filled 2021-05-20: qty 1

## 2021-05-20 MED ORDER — SODIUM CHLORIDE 0.9 % IV SOLN
5.0000 mg/kg | Freq: Once | INTRAVENOUS | Status: AC
  Administered 2021-05-20: 450 mg via INTRAVENOUS
  Filled 2021-05-20: qty 16

## 2021-05-20 MED ORDER — PALONOSETRON HCL INJECTION 0.25 MG/5ML
0.2500 mg | Freq: Once | INTRAVENOUS | Status: AC
  Administered 2021-05-20: 0.25 mg via INTRAVENOUS
  Filled 2021-05-20: qty 5

## 2021-05-20 MED ORDER — SODIUM CHLORIDE 0.9 % IV SOLN
150.0000 mg | Freq: Once | INTRAVENOUS | Status: AC
  Administered 2021-05-20: 150 mg via INTRAVENOUS
  Filled 2021-05-20: qty 150

## 2021-05-20 MED ORDER — SODIUM CHLORIDE 0.9 % IV SOLN: Freq: Once | INTRAVENOUS | Status: AC

## 2021-05-20 MED ORDER — SODIUM CHLORIDE 0.9 % IV SOLN
2400.0000 mg/m2 | INTRAVENOUS | Status: DC
  Administered 2021-05-20: 5000 mg via INTRAVENOUS
  Filled 2021-05-20: qty 100

## 2021-05-20 MED ORDER — SODIUM CHLORIDE 0.9 % IV SOLN: Freq: Once | INTRAVENOUS | Status: DC

## 2021-05-20 MED ORDER — IRINOTECAN HCL CHEMO INJECTION 100 MG/5ML
135.0000 mg/m2 | Freq: Once | INTRAVENOUS | Status: AC
  Administered 2021-05-20: 280 mg via INTRAVENOUS
  Filled 2021-05-20: qty 10

## 2021-05-20 MED ORDER — FLUOROURACIL CHEMO INJECTION 2.5 GM/50ML
400.0000 mg/m2 | Freq: Once | INTRAVENOUS | Status: AC
  Administered 2021-05-20: 850 mg via INTRAVENOUS
  Filled 2021-05-20: qty 17

## 2021-05-20 MED ORDER — LEUCOVORIN CALCIUM INJECTION 350 MG
400.0000 mg/m2 | Freq: Once | INTRAVENOUS | Status: AC
  Administered 2021-05-20: 836 mg via INTRAVENOUS
  Filled 2021-05-20: qty 41.8

## 2021-05-20 MED ORDER — SODIUM CHLORIDE 0.9 % IV SOLN
10.0000 mg | Freq: Once | INTRAVENOUS | Status: AC
  Administered 2021-05-20: 10 mg via INTRAVENOUS
  Filled 2021-05-20: qty 10

## 2021-05-20 NOTE — Patient Instructions (Signed)
Fluorouracil, 5-FU injection What is this medication? FLUOROURACIL, 5-FU (flure oh YOOR a sil) is a chemotherapy drug. It slows the growth of cancer cells. This medicine is used to treat many types of cancer like breast cancer, colon or rectal cancer, pancreatic cancer, and stomach cancer. This medicine may be used for other purposes; ask your health care provider or pharmacist if you have questions. COMMON BRAND NAME(S): Adrucil What should I tell my care team before I take this medication? They need to know if you have any of these conditions: blood disorders dihydropyrimidine dehydrogenase (DPD) deficiency infection (especially a virus infection such as chickenpox, cold sores, or herpes) kidney disease liver disease malnourished, poor nutrition recent or ongoing radiation therapy an unusual or allergic reaction to fluorouracil, other chemotherapy, other medicines, foods, dyes, or preservatives pregnant or trying to get pregnant breast-feeding How should I use this medication? This drug is given as an infusion or injection into a vein. It is administered in a hospital or clinic by a specially trained health care professional. Talk to your pediatrician regarding the use of this medicine in children. Special care may be needed. Overdosage: If you think you have taken too much of this medicine contact a poison control center or emergency room at once. NOTE: This medicine is only for you. Do not share this medicine with others. What if I miss a dose? It is important not to miss your dose. Call your doctor or health care professional if you are unable to keep an appointment. What may interact with this medication? Do not take this medicine with any of the following medications: live virus vaccines This medicine may also interact with the following medications: medicines that treat or prevent blood clots like warfarin, enoxaparin, and dalteparin This list may not describe all possible  interactions. Give your health care provider a list of all the medicines, herbs, non-prescription drugs, or dietary supplements you use. Also tell them if you smoke, drink alcohol, or use illegal drugs. Some items may interact with your medicine. What should I watch for while using this medication? Visit your doctor for checks on your progress. This drug may make you feel generally unwell. This is not uncommon, as chemotherapy can affect healthy cells as well as cancer cells. Report any side effects. Continue your course of treatment even though you feel ill unless your doctor tells you to stop. In some cases, you may be given additional medicines to help with side effects. Follow all directions for their use. Call your doctor or health care professional for advice if you get a fever, chills or sore throat, or other symptoms of a cold or flu. Do not treat yourself. This drug decreases your body's ability to fight infections. Try to avoid being around people who are sick. This medicine may increase your risk to bruise or bleed. Call your doctor or health care professional if you notice any unusual bleeding. Be careful brushing and flossing your teeth or using a toothpick because you may get an infection or bleed more easily. If you have any dental work done, tell your dentist you are receiving this medicine. Avoid taking products that contain aspirin, acetaminophen, ibuprofen, naproxen, or ketoprofen unless instructed by your doctor. These medicines may hide a fever. Do not become pregnant while taking this medicine. Women should inform their doctor if they wish to become pregnant or think they might be pregnant. There is a potential for serious side effects to an unborn child. Talk to your health care  professional or pharmacist for more information. Do not breast-feed an infant while taking this medicine. Men should inform their doctor if they wish to father a child. This medicine may lower sperm  counts. Do not treat diarrhea with over the counter products. Contact your doctor if you have diarrhea that lasts more than 2 days or if it is severe and watery. This medicine can make you more sensitive to the sun. Keep out of the sun. If you cannot avoid being in the sun, wear protective clothing and use sunscreen. Do not use sun lamps or tanning beds/booths. What side effects may I notice from receiving this medication? Side effects that you should report to your doctor or health care professional as soon as possible: allergic reactions like skin rash, itching or hives, swelling of the face, lips, or tongue low blood counts - this medicine may decrease the number of white blood cells, red blood cells and platelets. You may be at increased risk for infections and bleeding. signs of infection - fever or chills, cough, sore throat, pain or difficulty passing urine signs of decreased platelets or bleeding - bruising, pinpoint red spots on the skin, black, tarry stools, blood in the urine signs of decreased red blood cells - unusually weak or tired, fainting spells, lightheadedness breathing problems changes in vision chest pain mouth sores nausea and vomiting pain, swelling, redness at site where injected pain, tingling, numbness in the hands or feet redness, swelling, or sores on hands or feet stomach pain unusual bleeding Side effects that usually do not require medical attention (report to your doctor or health care professional if they continue or are bothersome): changes in finger or toe nails diarrhea dry or itchy skin hair loss headache loss of appetite sensitivity of eyes to the light stomach upset unusually teary eyes This list may not describe all possible side effects. Call your doctor for medical advice about side effects. You may report side effects to FDA at 1-800-FDA-1088. Where should I keep my medication? This drug is given in a hospital or clinic and will not be  stored at home. NOTE: This sheet is a summary. It may not cover all possible information. If you have questions about this medicine, talk to your doctor, pharmacist, or health care provider.  2022 Elsevier/Gold Standard (2021-02-04 00:00:00) Leucovorin injection What is this medication? LEUCOVORIN (loo koe VOR in) is used to prevent or treat the harmful effects of some medicines. This medicine is used to treat anemia caused by a low amount of folic acid in the body. It is also used with 5-fluorouracil (5-FU) to treat colon cancer. This medicine may be used for other purposes; ask your health care provider or pharmacist if you have questions. What should I tell my care team before I take this medication? They need to know if you have any of these conditions: anemia from low levels of vitamin B-12 in the blood an unusual or allergic reaction to leucovorin, folic acid, other medicines, foods, dyes, or preservatives pregnant or trying to get pregnant breast-feeding How should I use this medication? This medicine is for injection into a muscle or into a vein. It is given by a health care professional in a hospital or clinic setting. Talk to your pediatrician regarding the use of this medicine in children. Special care may be needed. Overdosage: If you think you have taken too much of this medicine contact a poison control center or emergency room at once. NOTE: This medicine is only for you.  Do not share this medicine with others. What if I miss a dose? This does not apply. What may interact with this medication? capecitabine fluorouracil phenobarbital phenytoin primidone trimethoprim-sulfamethoxazole This list may not describe all possible interactions. Give your health care provider a list of all the medicines, herbs, non-prescription drugs, or dietary supplements you use. Also tell them if you smoke, drink alcohol, or use illegal drugs. Some items may interact with your medicine. What  should I watch for while using this medication? Your condition will be monitored carefully while you are receiving this medicine. This medicine may increase the side effects of 5-fluorouracil, 5-FU. Tell your doctor or health care professional if you have diarrhea or mouth sores that do not get better or that get worse. What side effects may I notice from receiving this medication? Side effects that you should report to your doctor or health care professional as soon as possible: allergic reactions like skin rash, itching or hives, swelling of the face, lips, or tongue breathing problems fever, infection mouth sores unusual bleeding or bruising unusually weak or tired Side effects that usually do not require medical attention (report to your doctor or health care professional if they continue or are bothersome): constipation or diarrhea loss of appetite nausea, vomiting This list may not describe all possible side effects. Call your doctor for medical advice about side effects. You may report side effects to FDA at 1-800-FDA-1088. Where should I keep my medication? This drug is given in a hospital or clinic and will not be stored at home. NOTE: This sheet is a summary. It may not cover all possible information. If you have questions about this medicine, talk to your doctor, pharmacist, or health care provider.  2022 Elsevier/Gold Standard (2007-11-24 00:00:00) Irinotecan injection What is this medication? IRINOTECAN (ir in oh TEE kan ) is a chemotherapy drug. It is used to treat colon and rectal cancer. This medicine may be used for other purposes; ask your health care provider or pharmacist if you have questions. COMMON BRAND NAME(S): Camptosar What should I tell my care team before I take this medication? They need to know if you have any of these conditions: dehydration diarrhea infection (especially a virus infection such as chickenpox, cold sores, or herpes) liver disease low  blood counts, like low white cell, platelet, or red cell counts low levels of calcium, magnesium, or potassium in the blood recent or ongoing radiation therapy an unusual or allergic reaction to irinotecan, other medicines, foods, dyes, or preservatives pregnant or trying to get pregnant breast-feeding How should I use this medication? This drug is given as an infusion into a vein. It is administered in a hospital or clinic by a specially trained health care professional. Talk to your pediatrician regarding the use of this medicine in children. Special care may be needed. Overdosage: If you think you have taken too much of this medicine contact a poison control center or emergency room at once. NOTE: This medicine is only for you. Do not share this medicine with others. What if I miss a dose? It is important not to miss your dose. Call your doctor or health care professional if you are unable to keep an appointment. What may interact with this medication? Do not take this medicine with any of the following medications: cobicistat itraconazole This medicine may interact with the following medications: antiviral medicines for HIV or AIDS certain antibiotics like rifampin or rifabutin certain medicines for fungal infections like ketoconazole, posaconazole, and voriconazole certain  medicines for seizures like carbamazepine, phenobarbital, phenotoin clarithromycin gemfibrozil nefazodone [place]. John's Wort This list may not describe all possible interactions. Give your health care provider a list of all the medicines, herbs, non-prescription drugs, or dietary supplements you use. Also tell them if you smoke, drink alcohol, or use illegal drugs. Some items may interact with your medicine. What should I watch for while using this medication? Your condition will be monitored carefully while you are receiving this medicine. You will need important blood work done while you are taking this  medicine. This drug may make you feel generally unwell. This is not uncommon, as chemotherapy can affect healthy cells as well as cancer cells. Report any side effects. Continue your course of treatment even though you feel ill unless your doctor tells you to stop. In some cases, you may be given additional medicines to help with side effects. Follow all directions for their use. You may get drowsy or dizzy. Do not drive, use machinery, or do anything that needs mental alertness until you know how this medicine affects you. Do not stand or sit up quickly, especially if you are an older patient. This reduces the risk of dizzy or fainting spells. Call your health care professional for advice if you get a fever, chills, or sore throat, or other symptoms of a cold or flu. Do not treat yourself. This medicine decreases your body's ability to fight infections. Try to avoid being around people who are sick. Avoid taking products that contain aspirin, acetaminophen, ibuprofen, naproxen, or ketoprofen unless instructed by your doctor. These medicines may hide a fever. This medicine may increase your risk to bruise or bleed. Call your doctor or health care professional if you notice any unusual bleeding. Be careful brushing and flossing your teeth or using a toothpick because you may get an infection or bleed more easily. If you have any dental work done, tell your dentist you are receiving this medicine. Do not become pregnant while taking this medicine or for 6 months after stopping it. Women should inform their health care professional if they wish to become pregnant or think they might be pregnant. Men should not father a child while taking this medicine and for 3 months after stopping it. There is potential for serious side effects to an unborn child. Talk to your health care professional for more information. Do not breast-feed an infant while taking this medicine or for 7 days after stopping it. This medicine  has caused ovarian failure in some women. This medicine may make it more difficult to get pregnant. Talk to your health care professional if you are concerned about your fertility. This medicine has caused decreased sperm counts in some men. This may make it more difficult to father a child. Talk to your health care professional if you are concerned about your fertility. What side effects may I notice from receiving this medication? Side effects that you should report to your doctor or health care professional as soon as possible: allergic reactions like skin rash, itching or hives, swelling of the face, lips, or tongue chest pain diarrhea flushing, runny nose, sweating during infusion low blood counts - this medicine may decrease the number of white blood cells, red blood cells and platelets. You may be at increased risk for infections and bleeding. nausea, vomiting pain, swelling, warmth in the leg signs of decreased platelets or bleeding - bruising, pinpoint red spots on the skin, black, tarry stools, blood in the urine signs of infection -  fever or chills, cough, sore throat, pain or difficulty passing urine signs of decreased red blood cells - unusually weak or tired, fainting spells, lightheadedness Side effects that usually do not require medical attention (report to your doctor or health care professional if they continue or are bothersome): constipation hair loss headache loss of appetite mouth sores stomach pain This list may not describe all possible side effects. Call your doctor for medical advice about side effects. You may report side effects to FDA at 1-800-FDA-1088. Where should I keep my medication? This drug is given in a hospital or clinic and will not be stored at home. NOTE: This sheet is a summary. It may not cover all possible information. If you have questions about this medicine, talk to your doctor, pharmacist, or health care provider.  2022 Elsevier/Gold Standard  (2021-02-04 00:00:00)

## 2021-05-21 ENCOUNTER — Ambulatory Visit: Payer: No Typology Code available for payment source

## 2021-05-22 ENCOUNTER — Inpatient Hospital Stay: Payer: PRIVATE HEALTH INSURANCE

## 2021-05-22 ENCOUNTER — Other Ambulatory Visit: Payer: Self-pay

## 2021-05-22 VITALS — BP 111/70 | HR 97 | Temp 97.7°F | Resp 18 | Wt 192.1 lb

## 2021-05-22 DIAGNOSIS — C182 Malignant neoplasm of ascending colon: Secondary | ICD-10-CM

## 2021-05-22 DIAGNOSIS — Z5111 Encounter for antineoplastic chemotherapy: Secondary | ICD-10-CM | POA: Diagnosis not present

## 2021-05-22 MED ORDER — SODIUM CHLORIDE 0.9% FLUSH
10.0000 mL | INTRAVENOUS | Status: DC | PRN
  Administered 2021-05-22: 10 mL

## 2021-05-22 MED ORDER — SODIUM CHLORIDE 0.9 % IV SOLN: Freq: Once | INTRAVENOUS | Status: AC

## 2021-05-22 MED ORDER — HEPARIN SOD (PORK) LOCK FLUSH 100 UNIT/ML IV SOLN
500.0000 [IU] | Freq: Once | INTRAVENOUS | Status: AC | PRN
  Administered 2021-05-22: 500 [IU]

## 2021-05-22 NOTE — Patient Instructions (Signed)
Fluorouracil, 5-FU injection Quel est ce mdicament ? Le FLUORO-URACILE, 5-FU est un agent chimiothrapeutique. Il ralentit la prolifration des cellules cancreuses. Ce mdicament est utilis Scientist, water quality de nombreux types de cancer, tels que les cancers du sein, du clon ou du rectum, du pancras et de Chief Financial Officer. Ce mdicament peut tre utilis pour d'autres indications ; adressez-vous  votre mdecin ou  votre pharmacien si vous [employer] questions. NOM(S) DE MARQUE COURANT(S) : Adrucil Que dois-je dire  mon fournisseur de soins avant de prendre ce mdicament ? Ils ont besoin de savoir si vous prsentez lune quelconque des affections ou situations suivantes : troubles sanguins carence en dihydropyrimidine dshydrognase (DPD) infection (notamment les infections virales, telles que la varicelle, les boutons de fivre ou l'herps) maladie rnale maladie du foie malnutrition, mauvaise alimentation radiothrapie rcente ou en cours raction inhabituelle ou allergique au fluoro-uracile ou  d'autres agents chimiothrapeutiques raction inhabituelle ou allergique  d'autres mdicaments, aliments, colorants ou conservateurs vous tes enceinte ou cherchez  tre enceinte vous allaitez Comment dois-je utiliser ce mdicament ? Ce mdicament est destin  tre administr par injection ou perfusion dans une veine. Il est administr dans un hpital ou une clinique par un professionnel de sant spcialement form. Interrogez votre pdiatre quant  l'utilisation de ce mdicament [employer]. Des prcautions particulires Electrical engineer. Surdosage : si vous pensez avoir pris ce mdicament en excs, contactez immdiatement un centre [employer]. REMARQUE : ce mdicament vous est uniquement destin. Ne partagez pas ce Location manager. Que faire si je saute une dose ? Il est important de ne pas sauter de dose. Contactez votre mdecin ou votre  fournisseur de soins si vous ne pouvez vous rendre  Engineer, drilling. Quelles sont les interactions possibles avec ce mdicament ? Ne prenez pas ce mdicament avec l'une quelconque des substances suivantes : vaccins  virus vivants Ce mdicament peut galement interagir avec les mdicaments suivants : certains mdicaments utiliss dans le traitement ou la prvention des caillots sanguins, tels que la warfarine, l'noxaparine et la daltparine Cette liste peut ne pas dcrire toutes les interactions mdicamenteuses possibles. Donnez  votre fournisseur de [employer] de tous les mdicaments, plantes mdicinales, mdicaments en vente libre ou complments alimentaires que vous prenez. Dites-lui aussi si vous fumez, buvez de l'alcool ou consommez des drogues illicites. Certaines substances peuvent interagir Physiological scientist. Que dois-je Museum/gallery exhibitions officer par ce mdicament ? Consultez votre mdecin ou votre fournisseur de soins pour [employer] surveillance rgulire de l'volution de votre tat de sant. Ce mdicament peut entraner une sensation de malaise gnral. Cela n'a rien d'exceptionnel puisque la chimiothrapie est susceptible d'affecter aussi bien les cellules saines que les cellules cancreuses. Signalez Psychologist, counselling. Continuez votre traitement mme si vous vous sentez malade, sauf indication contraire de la part de votre mdecin. Dans certains cas, il est possible qu'on vous prescrive des mdicaments supplmentaires destins  attnuer les effets secondaires. [employer] les instructions d'emploi. Demandez l'avis de votre mdecin ou de votre fournisseur de soins si vous avez de la fivre, des frissons ou un mal de gorge, ou d'autres symptmes de rhume ou de grippe. Ne pratiquez pas l'automdication. Ce mdicament peut diminuer la capacit de votre organisme  combattre les infections. vitez de vous approcher des [employer]. Ce mdicament peut  accrotre le risque d'hmatomes (bleus) ou de saignement. Si vous remarquez un quelconque saignement anormal, contactez votre mdecin ou votre fournisseur de [place]. Soyez prudent(e) lorsque  vous vous brossez les dents ou que vous utilisez du fil dentaire ou un cure-dents, car vous pouvez dvelopper une infection ou saigner plus facilement. Si vous devez [employer], dites  votre dentiste que vous tes sous traitement par [employer]. vitez de prendre des [employer] de l'aspirine, du paractamol (acetaminophen), de l'ibuprofne, du naproxne ou du ktoprofne sauf indication contraire de la part [place]. Ces mdicaments peuvent masquer une fivre. Vous ne devez pas tomber Sports administrator par ce mdicament. Les Training and development officer mdecin si elles souhaitent avoir un enfant ou si elles pensent tre enceintes. Il existe un risque d'effets secondaires graves pour [employer]. Pour plus d'informations, parlez-en  votre mdecin,  votre fournisseur de Production manager. N'allaitez Biomedical engineer par ce [employer]. Les Social research officer, government mdecin s'ils souhaitent concevoir un enfant. Ce mdicament peut diminuer la numration des spermatozodes. Ne traitez pas une diarrhe [employer] des mdicaments en vente [place]. Contactez votre mdecin si vous avez de la diarrhe pendant plus de 2 jours ou si votre diarrhe est importante et liquide. Ce mdicament peut vous rendre plus sensible au soleil. Ne vous exposez Arboriculturist. Si vous ne pouvez pas viter d'tre au soleil, Ambulance person des vtements de protection et appliquez un Database administrator. N'utilisez ni lampe solaire, ni lit ou cabine de bronzage. Quels effets secondaires puis-je remarquer en prenant ce mdicament ? Effets secondaires que vous devez signaler  votre mdecin ou  votre fournisseur de soins ds que possible : ractions allergiques telles qu'une ruption cutane, des  Ship broker, de Manufacturing engineer, un gonflement du visage, des lvres ou de la langue faibles numrations sanguines : ce mdicament peut diminuer le nombre de globules blancs, de globules rouges et de [employer] sang. Vous pouvez courir un risque accru d'infections et de saignement. signes d'infection : fivre ou frissons, toux, mal de gorge, miction (mission d'urine) douloureuse ou difficile signes de baisse de la numration plaquettaire ou de saignement : ecchymoses (bleus), taches rouges en pointe d'pingle sur la peau, selles noires,  aspect de goudron, sang dans les urines signes de baisse de la numration rythrocytaire (globules rouges) : fatigue ou faiblesse inhabituelles, pisodes d'vanouissement, sensation d'brit difficults pour respirer modifications de la vision douleur thoracique plaies dans la bouche nauses ou vomissements douleur, gonflement ou rougeur au site [employer], insensibilit ou fourmillements dans les mains ou les pieds rougeur, gonflement ou plaies affectant les mains ou les pieds douleur d'estomac saignement inhabituel Effets secondaires ne ncessitant pas habituellement de consultation mdicale (signalez-les  votre mdecin ou  votre fournisseur de soins s'ils persistent ou sont gnants) : modifications affectant les ongles des doigts ou des orteils diarrhe peau sche, dmangeaisons perte de cheveux mal de tte perte d'apptit sensibilit accrue des yeux  la lumire troubles d'estomac yeux anormalement larmoyants Cette liste peut ne pas dcrire tous les effets secondaires possibles. Appelez votre mdecin pour [employer] sujet des [employer]. Vous pouvez signaler les effets secondaires  la FDA, au [phone]. O dois-je conserver mon mdicament ? Ce mdicament est administr par un professionnel de sant dans un hpital ou [employer]. Vous ne recevrez pas ce mdicament pour [employer] vous. REMARQUE :  cette notice est un rsum. Elle peut ne pas contenir toutes les informations possibles. Si vous avez des questions  propos de ce mdicament, Secretary/administrator mdecin, votre pharmacien ou votre fournisseur de [place].  2022 Elsevier/Gold Standard (2019-09-21 00:00:00)

## 2021-05-23 ENCOUNTER — Encounter: Payer: Self-pay | Admitting: Oncology

## 2021-05-27 ENCOUNTER — Inpatient Hospital Stay: Payer: PRIVATE HEALTH INSURANCE

## 2021-05-27 ENCOUNTER — Encounter: Payer: Self-pay | Admitting: Oncology

## 2021-05-28 ENCOUNTER — Encounter: Payer: No Typology Code available for payment source | Admitting: Psychology

## 2021-05-28 ENCOUNTER — Ambulatory Visit: Payer: No Typology Code available for payment source

## 2021-05-28 NOTE — Progress Notes (Signed)
[facility]  [address] [place],  [place]  [address] [phone]  Clinic Day:  06/03/2020  Referring physician: Greig Right, MD  This document serves as a record of services personally performed by Dequincy Macarthur Critchley, MD. It was created on their behalf by [facility], a trained medical scribe. The creation of this record is based on the scribe's personal observations and the provider's statements to them.  HISTORY OF PRESENT ILLNESS:  The patient is a 55 y.o. female with metastatic colon cancer, which includes spread of disease to her abdominal cavity, lungs, brain and right adrenal gland. She comes in today prior to her 22nd cycle of FOLFIRI/Avastin.  The patient claims to have toleraed her 21st cycle of treatment fairly well.  Her diarrhea remains prominent, for which she receives atropine to offset the effects of irinotecan.  She has over-the-counter antidiarrheal therapy, but has not been using it recently due to fear of constipation.  Overall, she is doing well.  She denies having any new symptoms/findings which concern her for disease progression.     With respect to her colon cancer history, she is status post a right hemicolectomy in early October 2018, followed by 12 cycles of adjuvant chemotherapy, which were completed in April 2019.  In December 2021, she underwent a left cerebellar metastatectomy, whose pathology was consistent with metastatic colon cancer.  Tumor testing did come back MMR normal.  CT scans recently showed evidence of her cancer being in multiple locations, which has her on palliative chemotherapy at this time.    PHYSICAL EXAM:  Blood pressure (!) 144/78, pulse (!) 110, temperature 98.4 F (36.9 C), resp. rate 14, height 5' 8"  (1.727 m), weight 181 lb 3.2 oz (82.2 kg), last menstrual period 02/22/2019, SpO2 96 %. Wt Readings from Last 3 Encounters:  06/03/21 181 lb 3.2 oz (82.2 kg)  05/30/21 182 lb 12 oz (82.9 kg)   05/29/21 190 lb (86.2 kg)   Body mass index is 27.55 kg/m. Performance status (ECOG): 1 Physical Exam Constitutional:      General: She is not in acute distress.    Appearance: Normal appearance. She is normal weight. She is not ill-appearing.  HENT:     Head: Normocephalic and atraumatic.  Eyes:     General: No scleral icterus.    Extraocular Movements: Extraocular movements intact.     Conjunctiva/sclera: Conjunctivae normal.     Pupils: Pupils are equal, round, and reactive to light.  Cardiovascular:     Rate and Rhythm: Regular rhythm. Tachycardia present.     Pulses: Normal pulses.     Heart sounds: Normal heart sounds. No murmur heard.   No friction rub. No gallop.  Pulmonary:     Effort: Pulmonary effort is normal. No respiratory distress.     Breath sounds: Normal breath sounds.  Abdominal:     General: Bowel sounds are normal. There is no distension.     Palpations: Abdomen is soft. There is no hepatomegaly, splenomegaly or mass.     Tenderness: There is no abdominal tenderness.  Musculoskeletal:        General: Normal range of motion.     Cervical back: Normal range of motion and neck supple.     Right lower leg: No edema.     Left lower leg: No edema.  Lymphadenopathy:     Cervical: No cervical adenopathy.  Skin:    General: Skin is warm and dry.  Neurological:  General: No focal deficit present.     Mental Status: She is alert and oriented to person, place, and time. Mental status is at baseline.  Psychiatric:        Mood and Affect: Mood normal.        Behavior: Behavior normal.        Thought Content: Thought content normal.        Judgment: Judgment normal.   LABS:    Latest Reference Range & Units 06/03/21 00:00  Sodium 137 - 147  142 (E)  Potassium 3.4 - 5.3  3.0 ! (E)  Chloride 99 - 108  110 ! (E)  CO2 13 - 22  26 ! (E)  Glucose  115 (E)  BUN 4 - 21  12 (E)  Creatinine 0.5 - 1.1  1.2 ! (E)  Calcium 8.7 - 10.7  8.6 ! (E)  Alkaline  Phosphatase 25 - 125  157 ! (E)  Albumin 3.5 - 5.0  3.9 (E)  AST 13 - 35  70 ! (E)  ALT 7 - 35  72 ! (E)  Bilirubin, Total  0.6 (E)  WBC  2.4 (E)  RBC 3.87 - 5.11  3.71 ! (E)  Hemoglobin 12.0 - 16.0  12.3 (E)  HCT 36 - 46  36 (E)  MCV 81 - 99  98 (E)  Platelets 150 - 399  106 ! (E)  NEUT#  0.79 (E)  !: Data is abnormal (E): External lab result  ASSESSMENT & PLAN:  Assessment/Plan:  A 55 y.o. female with metastatic colon cancer.  Due to her low absolute neutrophil count, her 22nd cycle of FOLFIRI/Avastin will be delayed for 1 week.  I will give her an extra dose of Zarxio tomorrow to help bolster her white count recovery.  After she receives her 22nd cycle of FOLFIRI/Avastin, I will give her Zarxio 480 mcg for 3 days during the middle of that cycle to help hasten her white count recovery in preparation for her next cycle of chemotherapy.  With respect to her hypokalemia, I will prescribe her potassium 20 mEq, which she will take for 5 consecutive days.  Clinically, she appears to be doing well.  I will see her back in 3 weeks before she heads into her 23rd cycle of FOLFIRI/Avastin. The patient understands all the plans discussed today and is in agreement with them.     I, Rita Ohara, am acting as scribe for Marice Potter, MD    I have reviewed this report as typed by the medical scribe, and it is complete and accurate.  Dequincy Macarthur Critchley, MD

## 2021-05-29 ENCOUNTER — Inpatient Hospital Stay: Payer: PRIVATE HEALTH INSURANCE

## 2021-05-29 ENCOUNTER — Other Ambulatory Visit: Payer: Self-pay

## 2021-05-29 VITALS — BP 126/86 | HR 100 | Temp 98.1°F | Resp 18 | Ht 68.0 in | Wt 190.0 lb

## 2021-05-29 DIAGNOSIS — Z5111 Encounter for antineoplastic chemotherapy: Secondary | ICD-10-CM | POA: Diagnosis not present

## 2021-05-29 DIAGNOSIS — C182 Malignant neoplasm of ascending colon: Secondary | ICD-10-CM

## 2021-05-29 MED ORDER — FILGRASTIM-SNDZ 480 MCG/0.8ML IJ SOSY
480.0000 ug | PREFILLED_SYRINGE | Freq: Once | INTRAMUSCULAR | Status: AC
  Administered 2021-05-29: 480 ug via SUBCUTANEOUS
  Filled 2021-05-29: qty 0.8

## 2021-05-29 NOTE — Patient Instructions (Signed)
Filgrastim, G-CSF injection °What is this medication? °FILGRASTIM, G-CSF (fil GRA stim) is a granulocyte colony-stimulating factor that stimulates the growth of neutrophils, a type of white blood cell (WBC) important in the body's fight against infection. It is used to reduce the incidence of fever and infection in patients with certain types of cancer who are receiving chemotherapy that affects the bone marrow, to stimulate blood cell production for removal of WBCs from the body prior to a bone marrow transplantation, to reduce the incidence of fever and infection in patients who have severe chronic neutropenia, and to improve survival outcomes following high-dose radiation exposure that is toxic to the bone marrow. °This medicine may be used for other purposes; ask your health care provider or pharmacist if you have questions. °COMMON BRAND NAME(S): Neupogen, Nivestym, Releuko, Zarxio °What should I tell my care team before I take this medication? °They need to know if you have any of these conditions: °kidney disease °latex allergy °ongoing radiation therapy °sickle cell disease °an unusual or allergic reaction to filgrastim, pegfilgrastim, other medicines, foods, dyes, or preservatives °pregnant or trying to get pregnant °breast-feeding °How should I use this medication? °This medicine is for injection under the skin or infusion into a vein. As an infusion into a vein, it is usually given by a health care professional in a hospital or clinic setting. If you get this medicine at home, you will be taught how to prepare and give this medicine. Refer to the Instructions for Use that come with your medication packaging. Use exactly as directed. Take your medicine at regular intervals. Do not take your medicine more often than directed. °It is important that you put your used needles and syringes in a special sharps container. Do not put them in a trash can. If you do not have a sharps container, call your pharmacist  or healthcare provider to get one. °Talk to your pediatrician regarding the use of this medicine in children. While this drug may be prescribed for children as young as 7 months for selected conditions, precautions do apply. °Overdosage: If you think you have taken too much of this medicine contact a poison control center or emergency room at once. °NOTE: This medicine is only for you. Do not share this medicine with others. °What if I miss a dose? °It is important not to miss your dose. Call your doctor or health care professional if you miss a dose. °What may interact with this medication? °This medicine may interact with the following medications: °medicines that may cause a release of neutrophils, such as lithium °This list may not describe all possible interactions. Give your health care provider a list of all the medicines, herbs, non-prescription drugs, or dietary supplements you use. Also tell them if you smoke, drink alcohol, or use illegal drugs. Some items may interact with your medicine. °What should I watch for while using this medication? °Your condition will be monitored carefully while you are receiving this medicine. °You may need blood work done while you are taking this medicine. °Talk to your health care provider about your risk of cancer. You may be more at risk for certain types of cancer if you take this medicine. °What side effects may I notice from receiving this medication? °Side effects that you should report to your doctor or health care professional as soon as possible: °allergic reactions like skin rash, itching or hives, swelling of the face, lips, or tongue °back pain °dizziness or feeling faint °fever °pain, redness, or   irritation at site where injected °pinpoint red spots on the skin °shortness of breath or breathing problems °signs and symptoms of kidney injury like trouble passing urine, change in the amount of urine, or red or dark-brown urine °stomach or side pain, or pain at  the shoulder °swelling °tiredness °unusual bleeding or bruising °Side effects that usually do not require medical attention (report to your doctor or health care professional if they continue or are bothersome): °bone pain °cough °diarrhea °hair loss °headache °muscle pain °This list may not describe all possible side effects. Call your doctor for medical advice about side effects. You may report side effects to FDA at 1-800-FDA-1088. °Where should I keep my medication? °Keep out of the reach of children. °Store in a refrigerator between 2 and 8 degrees C (36 and 46 degrees F). Do not freeze. Keep in carton to protect from light. Throw away this medicine if vials or syringes are left out of the refrigerator for more than 24 hours. Throw away any unused medicine after the expiration date. °NOTE: This sheet is a summary. It may not cover all possible information. If you have questions about this medicine, talk to your doctor, pharmacist, or health care provider. °© 2022 [employer] (2021-02-04 00:00:00) ° °

## 2021-05-30 ENCOUNTER — Encounter: Payer: Self-pay | Admitting: Oncology

## 2021-05-30 ENCOUNTER — Inpatient Hospital Stay: Payer: PRIVATE HEALTH INSURANCE

## 2021-05-30 VITALS — BP 130/93 | HR 100 | Temp 98.1°F | Resp 18 | Ht 68.0 in | Wt 182.8 lb

## 2021-05-30 DIAGNOSIS — C182 Malignant neoplasm of ascending colon: Secondary | ICD-10-CM

## 2021-05-30 DIAGNOSIS — Z5111 Encounter for antineoplastic chemotherapy: Secondary | ICD-10-CM | POA: Diagnosis not present

## 2021-05-30 MED ORDER — FILGRASTIM-SNDZ 480 MCG/0.8ML IJ SOSY
480.0000 ug | PREFILLED_SYRINGE | Freq: Once | INTRAMUSCULAR | Status: AC
  Administered 2021-05-30: 480 ug via SUBCUTANEOUS
  Filled 2021-05-30: qty 0.8

## 2021-05-30 MED FILL — Fosaprepitant Dimeglumine For IV Infusion 150 MG (Base Eq): INTRAVENOUS | Qty: 5 | Status: AC

## 2021-05-30 NOTE — Progress Notes (Signed)
1536:PT STABLE AT TIME OF DISCHARGE

## 2021-05-30 NOTE — Patient Instructions (Signed)

## 2021-06-01 ENCOUNTER — Encounter: Payer: Self-pay | Admitting: Oncology

## 2021-06-02 MED FILL — Fluorouracil IV Soln 5 GM/100ML (50 MG/ML): INTRAVENOUS | Qty: 100 | Status: AC

## 2021-06-03 ENCOUNTER — Inpatient Hospital Stay: Payer: PRIVATE HEALTH INSURANCE | Attending: Oncology | Admitting: Oncology

## 2021-06-03 ENCOUNTER — Other Ambulatory Visit: Payer: Self-pay | Admitting: Oncology

## 2021-06-03 ENCOUNTER — Encounter: Payer: Self-pay | Admitting: Oncology

## 2021-06-03 ENCOUNTER — Telehealth: Payer: Self-pay | Admitting: Oncology

## 2021-06-03 ENCOUNTER — Inpatient Hospital Stay: Payer: PRIVATE HEALTH INSURANCE

## 2021-06-03 ENCOUNTER — Other Ambulatory Visit: Payer: Self-pay | Admitting: Hematology and Oncology

## 2021-06-03 VITALS — BP 144/78 | HR 110 | Temp 98.4°F | Resp 14 | Ht 68.0 in | Wt 181.2 lb

## 2021-06-03 DIAGNOSIS — C7931 Secondary malignant neoplasm of brain: Secondary | ICD-10-CM | POA: Insufficient documentation

## 2021-06-03 DIAGNOSIS — C7801 Secondary malignant neoplasm of right lung: Secondary | ICD-10-CM | POA: Insufficient documentation

## 2021-06-03 DIAGNOSIS — Z5189 Encounter for other specified aftercare: Secondary | ICD-10-CM | POA: Insufficient documentation

## 2021-06-03 DIAGNOSIS — Z5111 Encounter for antineoplastic chemotherapy: Secondary | ICD-10-CM | POA: Insufficient documentation

## 2021-06-03 DIAGNOSIS — Z5112 Encounter for antineoplastic immunotherapy: Secondary | ICD-10-CM | POA: Insufficient documentation

## 2021-06-03 DIAGNOSIS — C182 Malignant neoplasm of ascending colon: Secondary | ICD-10-CM | POA: Diagnosis not present

## 2021-06-03 DIAGNOSIS — C7971 Secondary malignant neoplasm of right adrenal gland: Secondary | ICD-10-CM | POA: Insufficient documentation

## 2021-06-03 DIAGNOSIS — C189 Malignant neoplasm of colon, unspecified: Secondary | ICD-10-CM | POA: Insufficient documentation

## 2021-06-03 DIAGNOSIS — C7989 Secondary malignant neoplasm of other specified sites: Secondary | ICD-10-CM | POA: Insufficient documentation

## 2021-06-03 DIAGNOSIS — C7802 Secondary malignant neoplasm of left lung: Secondary | ICD-10-CM | POA: Insufficient documentation

## 2021-06-03 LAB — BASIC METABOLIC PANEL
BUN: 12 (ref 4–21)
CO2: 26 — AB (ref 13–22)
Chloride: 110 — AB (ref 99–108)
Creatinine: 1.2 — AB (ref 0.5–1.1)
Glucose: 115
Potassium: 3 — AB (ref 3.4–5.3)
Sodium: 142 (ref 137–147)

## 2021-06-03 LAB — CBC AND DIFFERENTIAL
HCT: 36 (ref 36–46)
Hemoglobin: 12.3 (ref 12.0–16.0)
Neutrophils Absolute: 0.79
Platelets: 106 — AB (ref 150–399)
WBC: 2.4

## 2021-06-03 LAB — HEPATIC FUNCTION PANEL
ALT: 72 — AB (ref 7–35)
AST: 70 — AB (ref 13–35)
Alkaline Phosphatase: 157 — AB (ref 25–125)
Bilirubin, Total: 0.6

## 2021-06-03 LAB — COMPREHENSIVE METABOLIC PANEL
Albumin: 3.9 (ref 3.5–5.0)
Calcium: 8.6 — AB (ref 8.7–10.7)

## 2021-06-03 LAB — CBC
MCV: 98 (ref 81–99)
RBC: 3.71 — AB (ref 3.87–5.11)

## 2021-06-03 MED ORDER — POTASSIUM CHLORIDE CRYS ER 20 MEQ PO TBCR: 20.0000 meq | EXTENDED_RELEASE_TABLET | Freq: Every day | ORAL | 0 refills | Status: DC

## 2021-06-03 MED FILL — Leucovorin Calcium For Inj 350 MG: INTRAMUSCULAR | Qty: 41.8 | Status: AC

## 2021-06-03 MED FILL — Irinotecan HCl Inj 100 MG/5ML (20 MG/ML): INTRAVENOUS | Qty: 14 | Status: AC

## 2021-06-03 MED FILL — Bevacizumab-bvzr IV Soln 400 MG/16ML (For Infusion): INTRAVENOUS | Qty: 18 | Status: AC

## 2021-06-03 NOTE — Telephone Encounter (Signed)
Per 1/3 LOS, Revised Schedule Accordingly - Requested patient be given Updated Appt Calendar

## 2021-06-04 ENCOUNTER — Other Ambulatory Visit: Payer: Self-pay

## 2021-06-04 ENCOUNTER — Inpatient Hospital Stay: Payer: PRIVATE HEALTH INSURANCE

## 2021-06-04 VITALS — BP 134/83 | HR 93 | Temp 98.2°F | Resp 16 | Ht 68.0 in | Wt 184.0 lb

## 2021-06-04 DIAGNOSIS — C7989 Secondary malignant neoplasm of other specified sites: Secondary | ICD-10-CM | POA: Diagnosis not present

## 2021-06-04 DIAGNOSIS — D702 Other drug-induced agranulocytosis: Secondary | ICD-10-CM

## 2021-06-04 DIAGNOSIS — Z5189 Encounter for other specified aftercare: Secondary | ICD-10-CM | POA: Diagnosis not present

## 2021-06-04 DIAGNOSIS — C7931 Secondary malignant neoplasm of brain: Secondary | ICD-10-CM | POA: Diagnosis not present

## 2021-06-04 DIAGNOSIS — Z5112 Encounter for antineoplastic immunotherapy: Secondary | ICD-10-CM | POA: Diagnosis present

## 2021-06-04 DIAGNOSIS — Z5111 Encounter for antineoplastic chemotherapy: Secondary | ICD-10-CM | POA: Diagnosis not present

## 2021-06-04 DIAGNOSIS — C7802 Secondary malignant neoplasm of left lung: Secondary | ICD-10-CM | POA: Diagnosis not present

## 2021-06-04 DIAGNOSIS — C189 Malignant neoplasm of colon, unspecified: Secondary | ICD-10-CM | POA: Diagnosis present

## 2021-06-04 DIAGNOSIS — C7801 Secondary malignant neoplasm of right lung: Secondary | ICD-10-CM | POA: Diagnosis not present

## 2021-06-04 DIAGNOSIS — C7971 Secondary malignant neoplasm of right adrenal gland: Secondary | ICD-10-CM | POA: Diagnosis not present

## 2021-06-04 MED ORDER — HEPARIN SOD (PORK) LOCK FLUSH 100 UNIT/ML IV SOLN
500.0000 [IU] | Freq: Once | INTRAVENOUS | Status: AC | PRN
  Administered 2021-06-04: 500 [IU]

## 2021-06-04 MED ORDER — SODIUM CHLORIDE 0.9% FLUSH
10.0000 mL | INTRAVENOUS | Status: DC | PRN
  Administered 2021-06-04: 10 mL

## 2021-06-04 MED ORDER — FILGRASTIM-SNDZ 480 MCG/0.8ML IJ SOSY
480.0000 ug | PREFILLED_SYRINGE | Freq: Once | INTRAMUSCULAR | Status: AC
  Administered 2021-06-04: 480 ug via SUBCUTANEOUS

## 2021-06-04 NOTE — Progress Notes (Signed)
1032:PT STABLE AT TIME OF DISCHARGE

## 2021-06-04 NOTE — Patient Instructions (Signed)
Filgrastim, G-CSF injection °What is this medication? °FILGRASTIM, G-CSF (fil GRA stim) is a granulocyte colony-stimulating factor that stimulates the growth of neutrophils, a type of white blood cell (WBC) important in the body's fight against infection. It is used to reduce the incidence of fever and infection in patients with certain types of cancer who are receiving chemotherapy that affects the bone marrow, to stimulate blood cell production for removal of WBCs from the body prior to a bone marrow transplantation, to reduce the incidence of fever and infection in patients who have severe chronic neutropenia, and to improve survival outcomes following high-dose radiation exposure that is toxic to the bone marrow. °This medicine may be used for other purposes; ask your health care provider or pharmacist if you have questions. °COMMON BRAND NAME(S): Neupogen, Nivestym, Releuko, Zarxio °What should I tell my care team before I take this medication? °They need to know if you have any of these conditions: °kidney disease °latex allergy °ongoing radiation therapy °sickle cell disease °an unusual or allergic reaction to filgrastim, pegfilgrastim, other medicines, foods, dyes, or preservatives °pregnant or trying to get pregnant °breast-feeding °How should I use this medication? °This medicine is for injection under the skin or infusion into a vein. As an infusion into a vein, it is usually given by a health care professional in a hospital or clinic setting. If you get this medicine at home, you will be taught how to prepare and give this medicine. Refer to the Instructions for Use that come with your medication packaging. Use exactly as directed. Take your medicine at regular intervals. Do not take your medicine more often than directed. °It is important that you put your used needles and syringes in a special sharps container. Do not put them in a trash can. If you do not have a sharps container, call your pharmacist  or healthcare provider to get one. °Talk to your pediatrician regarding the use of this medicine in children. While this drug may be prescribed for children as young as 7 months for selected conditions, precautions do apply. °Overdosage: If you think you have taken too much of this medicine contact a poison control center or emergency room at once. °NOTE: This medicine is only for you. Do not share this medicine with others. °What if I miss a dose? °It is important not to miss your dose. Call your doctor or health care professional if you miss a dose. °What may interact with this medication? °This medicine may interact with the following medications: °medicines that may cause a release of neutrophils, such as lithium °This list may not describe all possible interactions. Give your health care provider a list of all the medicines, herbs, non-prescription drugs, or dietary supplements you use. Also tell them if you smoke, drink alcohol, or use illegal drugs. Some items may interact with your medicine. °What should I watch for while using this medication? °Your condition will be monitored carefully while you are receiving this medicine. °You may need blood work done while you are taking this medicine. °Talk to your health care provider about your risk of cancer. You may be more at risk for certain types of cancer if you take this medicine. °What side effects may I notice from receiving this medication? °Side effects that you should report to your doctor or health care professional as soon as possible: °allergic reactions like skin rash, itching or hives, swelling of the face, lips, or tongue °back pain °dizziness or feeling faint °fever °pain, redness, or   irritation at site where injected °pinpoint red spots on the skin °shortness of breath or breathing problems °signs and symptoms of kidney injury like trouble passing urine, change in the amount of urine, or red or dark-brown urine °stomach or side pain, or pain at  the shoulder °swelling °tiredness °unusual bleeding or bruising °Side effects that usually do not require medical attention (report to your doctor or health care professional if they continue or are bothersome): °bone pain °cough °diarrhea °hair loss °headache °muscle pain °This list may not describe all possible side effects. Call your doctor for medical advice about side effects. You may report side effects to FDA at 1-800-FDA-1088. °Where should I keep my medication? °Keep out of the reach of children. °Store in a refrigerator between 2 and 8 degrees C (36 and 46 degrees F). Do not freeze. Keep in carton to protect from light. Throw away this medicine if vials or syringes are left out of the refrigerator for more than 24 hours. Throw away any unused medicine after the expiration date. °NOTE: This sheet is a summary. It may not cover all possible information. If you have questions about this medicine, talk to your doctor, pharmacist, or health care provider. °© 2022 [employer] (2021-02-04 00:00:00) ° °

## 2021-06-05 ENCOUNTER — Encounter: Payer: Self-pay | Admitting: Oncology

## 2021-06-06 ENCOUNTER — Encounter: Payer: Self-pay | Admitting: Oncology

## 2021-06-06 ENCOUNTER — Other Ambulatory Visit: Payer: Self-pay

## 2021-06-06 ENCOUNTER — Encounter: Payer: Self-pay | Admitting: Psychiatry

## 2021-06-06 ENCOUNTER — Ambulatory Visit (INDEPENDENT_AMBULATORY_CARE_PROVIDER_SITE_OTHER): Payer: PRIVATE HEALTH INSURANCE | Admitting: Psychiatry

## 2021-06-06 DIAGNOSIS — F333 Major depressive disorder, recurrent, severe with psychotic symptoms: Secondary | ICD-10-CM

## 2021-06-06 DIAGNOSIS — F422 Mixed obsessional thoughts and acts: Secondary | ICD-10-CM

## 2021-06-06 MED ORDER — CARIPRAZINE HCL 3 MG PO CAPS: 3.0000 mg | ORAL_CAPSULE | Freq: Every day | ORAL | 1 refills | Status: DC

## 2021-06-06 MED ORDER — SEROQUEL 100 MG PO TABS: 100.0000 mg | ORAL_TABLET | Freq: Every day | ORAL | 0 refills | Status: DC

## 2021-06-06 MED ORDER — FLUVOXAMINE MALEATE 100 MG PO TABS: 300.0000 mg | ORAL_TABLET | Freq: Every day | ORAL | 0 refills | Status: DC

## 2021-06-06 MED ORDER — LAMOTRIGINE 150 MG PO TABS: 150.0000 mg | ORAL_TABLET | Freq: Every day | ORAL | 3 refills | Status: DC

## 2021-06-06 NOTE — Patient Instructions (Signed)
Reduce Seroquel to 400 mg nightly for 2 weeks,  Then reduce Seroquel to 200 mg nightly for 2 weeks,  Then reduce to 100 mg for 2 weeks, Then reduce to 50 mg 2 weeks then stop if you can still sleep ok

## 2021-06-06 NOTE — Progress Notes (Signed)
Shannon [name] [id number] 1966-03-23 56 y.o.    Subjective:   Patient ID:  Shannon [name] is a 56 y.o. (DOB Oct 10, 1965) female.  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  seen November 2020 .  No meds were changed at that time.  10/16/19 the following is noted at this appt: Asked questions about sleep supplement.   Just started GI doctor.   Good overall. No mood swings. No depressive episodes.  Cry more with hormonal changes more easily.   Probably sleep too much to avoid.  Occ stress from work interferes with sleep.  Not as productive from home.  Hopes to get back to the office.  Still working from home.  Boss noticed her forgetfulness with deadlines and missing issues in emails.  Used to be sharper. Wonders if related to meds.  Can usually nap if desired for years and not seasonal.  Only anxiety is Covid related. More poor self care.   Hysterectomy December 2020. No med changes  03/18/2020 appointment with the following noted: July had Covid and both [name] and her father died from [place]. Able to help mom with arrangements. Required one iron infusion this year. Never took NAC bc it was too hard to take. Sleep, eating schedule is irregular. More productive at home helps her feel better. Plan: No med changes. 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 For a month had dizzy, falls, NV all of which cleared quickly after removal.  Radiation to brain after this. More CA in lungs and abdomen and having chemo.  Oncologist said she has about 5 years of lifespan left.  MRI and CT pending. Gotten into therapy starting Shannon [name]  [name] next week.   Pray a lot and good connection with friends.  Good support.  Trying to do things for fun.   Is working but finding it difficult mentally and physically DT SE fatigue from chemo. Considering LT disability but $ concerns.   If anxious trouble falling asleep.   Works from home.   D 54 yo.   Plan: No med changes except added modafinil  11/26/2020 appointment with the following noted: Added modafinil and didn't notice much from it. Sleep average about 7 hours.  Would prefer 10 hours and may nap here and there in week of chemo even on chemo.  Had to get [facility] with [facility]. No dx of OSA. Last 2 weeks a little low emotionally and mentally after up for a couple of weeks with company and it wore her out. Sleepiness is normally worse than tiredness. Only situational mood effects. Plan: No benefit 200 mg modafinil nor SE therefore Increase Modafinil to 300 mg daily for excessive sleepiness  04/21/2021 appointment with the following noted: Still dealing with chemotx for colon cancer Thinks increase modafinil questionable but misses it more than takes it. No SE. Things are getting harder rough month with more depression and anxiety related to cancer and daughter.  Work is hard.  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  Missing deadlines.   Needs to call social security. D 54 yo and in therapy with Shannon [name].  So angry  with patient. Has been able to stay on branded Seroquel. Tumors on CT scan  are shrinking recently. Plan: HX RELAPSE PSYCHOSIS WITH GENERIC QUETIAPINE, Continue branded Seroquel 800 mg HS and lamotrigine 150 mg daily and Luvox 300 mg daily Vraylar 1.5 mg daily for 2 weeks, Then if tolerated increase to 3 mg daily. If mood improves then reduce Seroquel to 1 and 1/2 tablets.  06/06/2021 appt noted: I like it.  More clear headed and better energy.  Missed a few bc out of samples. Reduced Seroquel 600 mg HS Less depressed.  Especially  right after starting and others noticed the benefit.   Patient denies difficulty with sleep maintenance.   Denies appetite disturbance.  Patient reports that energy and motivation have been good.  Patient denies any difficulty with concentration.  Patient denies any suicidal ideation.   Past Psychiatric Medication Trials: HX RELAPSE PSYCHOSIS WITH GENERIC QUETIAPINE,  Seroquel 800, Risperdal 2, inVega 3, Abilify 15,  Citalopram, fluoxetine 80, sertraline to 50,  lamotrigine 200,  Has been under the care of the psychiatrist since March 2000  Review of Systems:  Review of Systems  Constitutional:  Positive for fatigue.  HENT:  Positive for rhinorrhea.   Eyes:  Positive for discharge.  Gastrointestinal:  Positive for constipation. Negative for abdominal distention.  Neurological:  Positive for weakness. Negative for tremors.   Medications: I have reviewed the patient's current medications.  Current Outpatient Medications  Medication Sig Dispense Refill   acetaminophen (TYLENOL) 325 MG tablet Take 2 tablets (650 mg total) by mouth every 6 (six) hours as needed for mild pain.     atorvastatin (LIPITOR) 10 MG tablet Take by mouth.     CONTOUR NEXT TEST test strip 3 (three) times daily.     Dulaglutide (TRULICITY) 1.5 JQ/3.0SP SOPN Inject into the skin.     hyoscyamine (LEVSIN SL) 0.125 MG SL tablet Place 1 tablet (0.125 mg total) under the tongue every 6 (six) hours as needed. 45 tablet 1   insulin aspart (NOVOLOG FLEXPEN) 100 UNIT/ML FlexPen Inject 8 Units into the skin 3 (three) times daily with meals. 15 mL 11   insulin glargine (LANTUS) 100 UNIT/ML Solostar Pen Inject 26 Units into the skin daily. 15 mL 11   levothyroxine (SYNTHROID) 75 MCG tablet Take 1 tablet (75 mcg total) by mouth daily. 30 tablet 0   loratadine (CLARITIN) 10 MG tablet Take 10 mg by mouth daily.     LORazepam (ATIVAN) 0.5 MG tablet Take 0.5 mg by mouth every 8 (eight) hours as needed for anxiety.     metFORMIN  (GLUCOPHAGE) 500 MG tablet Take 1 tablet (500 mg total) by mouth 2 (two) times daily with a meal. 60 tablet 0   modafinil (PROVIGIL) 200 MG tablet Take 1.5 tablets (300 mg total) by mouth daily. 45 tablet 1   Multiple Vitamin (MULTI-VITAMIN) tablet Take 1 tablet by mouth daily.     NOVOFINE PEN NEEDLE 32G X 6 MM MISC SMARTSIG:Syringe(s) SUB-Q     Omega-3 1000 MG CAPS Take by mouth.     ondansetron (ZOFRAN) 4 MG tablet Take 1 tablet (4 mg total) by mouth every 4 (four) hours as needed for nausea. 90 tablet 3   pantoprazole (PROTONIX) 40 MG tablet TAKE 1 TABLET BY MOUTH EVERYDAY AT BEDTIME 30 tablet 0   potassium chloride SA (KLOR-CON M) 20 MEQ tablet Take 1 tablet (20 mEq total) by mouth daily. 5 tablet 0   prochlorperazine (COMPAZINE) 10 MG tablet TAKE 1  TABLET BY MOUTH EVERY 6 HOURS AS NEEDED FOR NAUSEA OR VOMITING. 90 tablet 3   senna-docusate (SENOKOT-S) 8.6-50 MG tablet Take 2 tablets by mouth at bedtime.     SEROQUEL 100 MG tablet Take 1 tablet (100 mg total) by mouth at bedtime. 30 tablet 0   zolpidem (AMBIEN) 5 MG tablet Take 1 tablet (5 mg total) by mouth at bedtime as needed for sleep. 30 tablet 0   cariprazine (VRAYLAR) 3 MG capsule Take 1 capsule (3 mg total) by mouth daily. 30 capsule 1   fluvoxaMINE (LUVOX) 100 MG tablet Take 3 tablets (300 mg total) by mouth at bedtime. 270 tablet 0   lamoTRIgine (LAMICTAL) 150 MG tablet Take 1 tablet (150 mg total) by mouth daily. 90 tablet 3   No current facility-administered medications for this visit.    Medication Side Effects: None  Allergies:  Allergies  Allergen Reactions   Clindamycin/Lincomycin Rash   Penicillins Rash    Reaction: 10 years    Past Medical History:  Diagnosis Date   Anemia    Iron De   Anxiety    Blood clot in vein    Bowel obstruction ([place])    Colon cancer ([place]) 2018   treated with surgery and chemotherapy   Depression    Diabetes mellitus without complication ([place])    Type II   DVT (deep venous  thrombosis) ([place]) 2019   behind right knee- while she wasa on chemo   GERD (gastroesophageal reflux disease)    History of blood transfusion    History of chemotherapy    History of kidney stones 2012   passed   Hypothyroidism    Obesity     Family History  Problem Relation Age of Onset   Irritable bowel syndrome Mother    Colon polyps Father    Breast cancer Maternal Grandmother    Diabetes Maternal Grandmother    Diabetes Maternal Grandfather    Breast cancer Paternal Grandmother    Colon polyps Maternal Uncle    Stomach cancer Neg Hx    Pancreatic cancer Neg Hx    Esophageal cancer Neg Hx    Colon cancer Neg Hx     Social History   Socioeconomic History   Marital status: Widowed    Spouse name: Not on file   Number of children: Not on file   Years of education: Not on file   Highest education level: Not on file  Occupational History   Occupation: care coordinator   Tobacco Use   Smoking status: Never   Smokeless tobacco: Never  Vaping Use   Vaping Use: Never used  Substance and Sexual Activity   Alcohol use: Yes    Comment: rare   Drug use: Never   Sexual activity: Not Currently  Other Topics Concern   Not on file  Social History Narrative   Not on file   Social Determinants of Health   Financial Resource Strain: Not on file  Food Insecurity: Not on file  Transportation Needs: Not on file  Physical Activity: Not on file  Stress: Not on file  Social Connections: Not on file  Intimate Partner Violence: Not on file    Past Medical History, Surgical history, Social history, and Family history were reviewed and updated as appropriate.   Please see review of systems for further details on the patient's review from today.   Objective:   Physical Exam:  LMP 02/22/2019 (Approximate)   Physical Exam Constitutional:  General: She is not in acute distress. Musculoskeletal:        General: No deformity.  Neurological:     Mental Status: She is  alert and oriented to person, place, and time.     Coordination: Coordination normal.  Psychiatric:        Attention and Perception: Attention and perception normal. She does not perceive auditory or visual hallucinations.        Mood and Affect: Mood is anxious. Mood is not depressed. Affect is not labile, blunt, angry or inappropriate.        Speech: Speech normal.        Behavior: Behavior normal.        Thought Content: Thought content normal. Thought content is not paranoid or delusional. Thought content does not include homicidal or suicidal ideation.        Cognition and Memory: Cognition and memory normal.        Judgment: Judgment normal.     Comments: Insight intact Less depressed on Vraylar    Lab Review:     Component Value Date/Time   NA 142 06/03/2021 0000   K 3.0 (A) 06/03/2021 0000   CL 110 (A) 06/03/2021 0000   CO2 26 (A) 06/03/2021 0000   GLUCOSE 234 (H) 06/07/2020 0525   BUN 12 06/03/2021 0000   CREATININE 1.2 (A) 06/03/2021 0000   CREATININE 0.97 06/07/2020 0525   CALCIUM 8.6 (A) 06/03/2021 0000   PROT 5.2 (L) 06/07/2020 0525   ALBUMIN 3.9 06/03/2021 0000   AST 70 (A) 06/03/2021 0000   ALT 72 (A) 06/03/2021 0000   ALKPHOS 157 (A) 06/03/2021 0000   BILITOT 0.3 06/07/2020 0525   GFRNONAA >60 06/07/2020 0525       Component Value Date/Time   WBC 2.4 06/03/2021 0000   WBC 4.8 06/07/2020 0525   RBC 3.71 (A) 06/03/2021 0000   HGB 12.3 06/03/2021 0000   HCT 36 06/03/2021 0000   PLT 106 (A) 06/03/2021 0000   MCV 98 06/03/2021 0000   MCH 32.6 09/16/2020 0000   MCHC 33 09/16/2020 0000   RDW 13.5 06/07/2020 0525   LYMPHSABS 1.0 06/07/2020 0525   MONOABS 0.4 06/07/2020 0525   EOSABS 0.0 06/07/2020 0525   BASOSABS 0.0 06/07/2020 0525    No results found for: POCLITH, LITHIUM   No results found for: PHENYTOIN, PHENOBARB, VALPROATE, CBMZ   .res Assessment: Plan:    Severe recurrent major depression with psychotic features ([place]) - Plan: cariprazine  (VRAYLAR) 3 MG capsule, SEROQUEL 100 MG tablet, lamoTRIgine (LAMICTAL) 150 MG tablet  Mixed obsessional thoughts and acts - Plan: cariprazine (VRAYLAR) 3 MG capsule, SEROQUEL 100 MG tablet, fluvoxaMINE (LUVOX) 100 MG tablet   Overall mood and anxiety are less well managed.  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 In general has handled this well except some trouble going to sleep on occ with anxiety.  However more recently she has become less depressed with Vraylar 3 mg and wants to switch off Seroquel.  Try to avoid Xanax if possible BC DDI with Luvox. Ambien 5 prn.  Disc amnesia risk.  No benefit 200 mg modafinil nor SE therefore OK continue trial  Modafinil to 300 mg daily for excessive sleepiness  Discussed potential metabolic side effects associated with atypical antipsychotics, as well as potential risk for movement side effects. Advised pt to contact office if movement side effects occur.   Counseled patient regarding potential  benefits, risks, and side effects of Lamictal to include potential risk of Stevens-Johnson syndrome. Advised patient to stop taking Lamictal and contact office immediately if rash develops and to seek urgent medical attention if rash is severe and/or spreading quickly.  HX RELAPSE PSYCHOSIS WITH GENERIC QUETIAPINE, Continue lamotrigine 150 mg daily and Luvox 300 mg daily Vraylar 3 mg daily. Reduce Seroquel to 400 mg nightly for 2 weeks,  Then reduce Seroquel to 200 mg nightly for 2 weeks,  Then reduce to 100 mg for 2 weeks, Then reduce to 50 mg 2 weeks then stop if you can still sleep ok  Disc insomnia is frequent problem trying to wean off Seroquel.  FU 8 weeks  Shannon Parents, MD, DFAPA   Please see After Visit Summary for patient specific instructions.  Future Appointments  Date Time Provider [place]  06/09/2021 11:15 AM [facility]LAB [facility] None  06/10/2021 11:30 AM [facility]  INFUSION CHAIR 5 [facility] None  06/12/2021  1:30 PM [facility] INFUSION CHAIR 5 [facility] None  06/17/2021 11:30 AM [facility] INFUSION CHAIR 8 [facility] None  06/18/2021 11:30 AM [facility] INFUSION CHAIR 1 [facility] None  06/23/2021  2:00 PM [facility] None  06/23/2021  2:30 PM Lewis, Dequincy A, MD [facility] None  06/24/2021 10:00 AM [facility] INFUSION CHAIR 3 [facility] None  06/26/2021  1:30 PM [facility] INFUSION CHAIR 6 [facility] None  08/05/2021  9:30 AM Vaslow, Acey Lav, MD [facility] None  08/06/2021 10:00 AM Edgardo Roys, PsyD [facility] CPR  08/07/2021  3:30 PM Cottle, Billey Co., MD CP-CP None  09/08/2021  4:00 PM Edgardo Roys, PsyD [facility] CPR  10/13/2021  4:00 PM Edgardo Roys, PsyD [facility] CPR    No orders of the defined types were placed in this encounter.      -------------------------------

## 2021-06-09 ENCOUNTER — Other Ambulatory Visit: Payer: Self-pay | Admitting: Hematology and Oncology

## 2021-06-09 ENCOUNTER — Inpatient Hospital Stay: Payer: PRIVATE HEALTH INSURANCE

## 2021-06-09 ENCOUNTER — Other Ambulatory Visit: Payer: Self-pay

## 2021-06-09 ENCOUNTER — Other Ambulatory Visit: Payer: Self-pay | Admitting: Radiation Therapy

## 2021-06-09 DIAGNOSIS — C182 Malignant neoplasm of ascending colon: Secondary | ICD-10-CM

## 2021-06-09 LAB — COMPREHENSIVE METABOLIC PANEL
Albumin: 4.2 (ref 3.5–5.0)
Calcium: 8.8 (ref 8.7–10.7)

## 2021-06-09 LAB — BASIC METABOLIC PANEL
BUN: 13 (ref 4–21)
CO2: 24 — AB (ref 13–22)
Chloride: 106 (ref 99–108)
Creatinine: 1.2 — AB (ref 0.5–1.1)
Glucose: 218
Potassium: 3.7 (ref 3.4–5.3)
Sodium: 139 (ref 137–147)

## 2021-06-09 LAB — CBC AND DIFFERENTIAL
HCT: 41 (ref 36–46)
Hemoglobin: 13.4 (ref 12.0–16.0)
Neutrophils Absolute: 4.31
Platelets: 83 — AB (ref 150–399)
WBC: 5.9

## 2021-06-09 LAB — CBC
MCV: 101 — AB (ref 81–99)
RBC: 4.09 (ref 3.87–5.11)

## 2021-06-09 LAB — HEPATIC FUNCTION PANEL
ALT: 42 — AB (ref 7–35)
AST: 40 — AB (ref 13–35)
Alkaline Phosphatase: 146 — AB (ref 25–125)
Bilirubin, Total: 0.6

## 2021-06-09 MED FILL — Irinotecan HCl Inj 100 MG/5ML (20 MG/ML): INTRAVENOUS | Qty: 14 | Status: AC

## 2021-06-09 MED FILL — Fluorouracil IV Soln 2.5 GM/50ML (50 MG/ML): INTRAVENOUS | Qty: 17 | Status: AC

## 2021-06-09 MED FILL — Bevacizumab-bvzr IV Soln 400 MG/16ML (For Infusion): INTRAVENOUS | Qty: 18 | Status: AC

## 2021-06-09 MED FILL — Fluorouracil IV Soln 5 GM/100ML (50 MG/ML): INTRAVENOUS | Qty: 100 | Status: AC

## 2021-06-09 MED FILL — Dexamethasone Sodium Phosphate Inj 100 MG/10ML: INTRAMUSCULAR | Qty: 1 | Status: AC

## 2021-06-09 MED FILL — Fosaprepitant Dimeglumine For IV Infusion 150 MG (Base Eq): INTRAVENOUS | Qty: 5 | Status: AC

## 2021-06-09 NOTE — Progress Notes (Signed)
Proceed with chemo despite platelets=83 K per Dr. Bobby Rumpf.

## 2021-06-10 ENCOUNTER — Inpatient Hospital Stay: Payer: PRIVATE HEALTH INSURANCE

## 2021-06-10 ENCOUNTER — Other Ambulatory Visit: Payer: Self-pay | Admitting: Psychiatry

## 2021-06-10 VITALS — BP 125/81 | HR 111 | Temp 97.5°F | Resp 16 | Ht 68.0 in | Wt 188.5 lb

## 2021-06-10 DIAGNOSIS — C182 Malignant neoplasm of ascending colon: Secondary | ICD-10-CM

## 2021-06-10 DIAGNOSIS — Z5111 Encounter for antineoplastic chemotherapy: Secondary | ICD-10-CM | POA: Diagnosis not present

## 2021-06-10 DIAGNOSIS — F422 Mixed obsessional thoughts and acts: Secondary | ICD-10-CM

## 2021-06-10 DIAGNOSIS — F333 Major depressive disorder, recurrent, severe with psychotic symptoms: Secondary | ICD-10-CM

## 2021-06-10 MED ORDER — IRINOTECAN HCL CHEMO INJECTION 100 MG/5ML
135.0000 mg/m2 | Freq: Once | INTRAVENOUS | Status: AC
  Administered 2021-06-10: 280 mg via INTRAVENOUS
  Filled 2021-06-10: qty 10

## 2021-06-10 MED ORDER — SODIUM CHLORIDE 0.9 % IV SOLN: Freq: Once | INTRAVENOUS | Status: AC

## 2021-06-10 MED ORDER — SODIUM CHLORIDE 0.9 % IV SOLN
5.0000 mg/kg | Freq: Once | INTRAVENOUS | Status: AC
  Administered 2021-06-10: 450 mg via INTRAVENOUS
  Filled 2021-06-10: qty 16

## 2021-06-10 MED ORDER — SODIUM CHLORIDE 0.9 % IV SOLN
10.0000 mg | Freq: Once | INTRAVENOUS | Status: AC
  Administered 2021-06-10: 10 mg via INTRAVENOUS
  Filled 2021-06-10: qty 10

## 2021-06-10 MED ORDER — SODIUM CHLORIDE 0.9 % IV SOLN
2400.0000 mg/m2 | INTRAVENOUS | Status: DC
  Administered 2021-06-10: 5000 mg via INTRAVENOUS
  Filled 2021-06-10: qty 100

## 2021-06-10 MED ORDER — LEUCOVORIN CALCIUM INJECTION 350 MG
400.0000 mg/m2 | Freq: Once | INTRAVENOUS | Status: AC
  Administered 2021-06-10: 836 mg via INTRAVENOUS
  Filled 2021-06-10: qty 25

## 2021-06-10 MED ORDER — PALONOSETRON HCL INJECTION 0.25 MG/5ML
0.2500 mg | Freq: Once | INTRAVENOUS | Status: AC
  Administered 2021-06-10: 0.25 mg via INTRAVENOUS
  Filled 2021-06-10: qty 5

## 2021-06-10 MED ORDER — SODIUM CHLORIDE 0.9 % IV SOLN
150.0000 mg | Freq: Once | INTRAVENOUS | Status: AC
  Administered 2021-06-10: 150 mg via INTRAVENOUS
  Filled 2021-06-10: qty 150

## 2021-06-10 MED ORDER — ATROPINE SULFATE 1 MG/ML IV SOLN
0.5000 mg | Freq: Once | INTRAVENOUS | Status: AC | PRN
  Administered 2021-06-10: 0.5 mg via INTRAVENOUS
  Filled 2021-06-10: qty 1

## 2021-06-10 MED ORDER — FLUOROURACIL CHEMO INJECTION 2.5 GM/50ML
400.0000 mg/m2 | Freq: Once | INTRAVENOUS | Status: AC
  Administered 2021-06-10: 850 mg via INTRAVENOUS
  Filled 2021-06-10: qty 17

## 2021-06-10 NOTE — Progress Notes (Signed)
1540:PT STABLE AT TIME OF DISCHARGE

## 2021-06-10 NOTE — Telephone Encounter (Signed)
I looked in Cover My Meds and PA questions expired 4/22.

## 2021-06-10 NOTE — Patient Instructions (Signed)
[facility]  Discharge Instructions: Thank you for choosing [facility] to provide your oncology and hematology care.  If you have a lab appointment with the [facility], please go directly to the [facility] and check in at the registration area.   Wear comfortable clothing and clothing appropriate for easy access to any Portacath or PICC line.   We strive to give you quality time with your provider. You may need to reschedule your appointment if you arrive late (15 or more minutes).  Arriving late affects you and other patients whose appointments are after yours.  Also, if you miss three or more appointments without notifying the office, you may be dismissed from the clinic at the providers discretion.      For prescription refill requests, have your pharmacy contact our office and allow 72 hours for refills to be completed.    Today you received the following chemotherapy and/or immunotherapy agents Bevacizumab,Leucovorin,Fluorouracil, Irinotecan     To help prevent nausea and vomiting after your treatment, we encourage you to take your nausea medication as directed.  BELOW ARE SYMPTOMS THAT SHOULD BE REPORTED IMMEDIATELY: *FEVER GREATER THAN 100.4 F (38 C) OR HIGHER *CHILLS OR SWEATING *NAUSEA AND VOMITING THAT IS NOT CONTROLLED WITH YOUR NAUSEA MEDICATION *UNUSUAL SHORTNESS OF BREATH *UNUSUAL BRUISING OR BLEEDING *URINARY PROBLEMS (pain or burning when urinating, or frequent urination) *BOWEL PROBLEMS (unusual diarrhea, constipation, pain near the anus) TENDERNESS IN MOUTH AND THROAT WITH OR WITHOUT PRESENCE OF ULCERS (sore throat, sores in mouth, or a toothache) UNUSUAL RASH, SWELLING OR PAIN  UNUSUAL VAGINAL DISCHARGE OR ITCHING   Items with * indicate a potential emergency and should be followed up as soon as possible or go to the Emergency Department if any problems should occur.  Please show the CHEMOTHERAPY ALERT CARD or  IMMUNOTHERAPY ALERT CARD at check-in to the Emergency Department and triage nurse.  Should you have questions after your visit or need to cancel or reschedule your appointment, please contact [facility]  Dept: [phone]  and follow the prompts.  Office hours are 8:00 a.m. to 4:30 p.m. Monday - Friday. Please note that voicemails left after 4:00 p.m. may not be returned until the following business day.  We are closed weekends and major holidays. You have access to a nurse at all times for urgent questions. Please call the main number to the clinic Dept: [phone] and follow the prompts.  For any non-urgent questions, you may also contact your provider using MyChart. We now offer e-Visits for anyone 54 and older to request care online for non-urgent symptoms. For details visit [email or web address].   Also download the MyChart app! Go to the app store, search "MyChart", open the app, select , and log in with your MyChart username and password.  Due to Covid, a mask is required upon entering the hospital/clinic. If you do not have a mask, one will be given to you upon arrival. For doctor visits, patients may have 1 support person aged 54 or older with them. For treatment visits, patients cannot have anyone with them due to current Covid guidelines and our immunocompromised population.

## 2021-06-11 ENCOUNTER — Encounter: Payer: Self-pay | Admitting: Oncology

## 2021-06-11 ENCOUNTER — Telehealth: Payer: Self-pay | Admitting: Oncology

## 2021-06-11 NOTE — Telephone Encounter (Signed)
Patient is in need of 1LNS when scheduled each time for a pump d/c. Changes have been made for the next two treatments and time has been adjusted.

## 2021-06-11 NOTE — Telephone Encounter (Signed)
No this is a new year  I will have to initiate one if they still have same insurance. If not I will need to get updated card.

## 2021-06-12 ENCOUNTER — Other Ambulatory Visit: Payer: Self-pay

## 2021-06-12 ENCOUNTER — Inpatient Hospital Stay: Payer: PRIVATE HEALTH INSURANCE

## 2021-06-12 ENCOUNTER — Ambulatory Visit: Payer: No Typology Code available for payment source

## 2021-06-12 VITALS — BP 126/94 | HR 95 | Temp 98.1°F | Resp 16 | Ht 68.0 in | Wt 189.0 lb

## 2021-06-12 DIAGNOSIS — Z5111 Encounter for antineoplastic chemotherapy: Secondary | ICD-10-CM | POA: Diagnosis not present

## 2021-06-12 DIAGNOSIS — C182 Malignant neoplasm of ascending colon: Secondary | ICD-10-CM

## 2021-06-12 MED ORDER — SODIUM CHLORIDE 0.9% FLUSH
10.0000 mL | INTRAVENOUS | Status: DC | PRN
  Administered 2021-06-12: 10 mL

## 2021-06-12 MED ORDER — HEPARIN SOD (PORK) LOCK FLUSH 100 UNIT/ML IV SOLN
500.0000 [IU] | Freq: Once | INTRAVENOUS | Status: AC | PRN
  Administered 2021-06-12: 500 [IU]

## 2021-06-12 MED ORDER — SODIUM CHLORIDE 0.9 % IV SOLN: Freq: Once | INTRAVENOUS | Status: DC

## 2021-06-12 NOTE — Telephone Encounter (Signed)
I have her insurance information. Will submit her PA through [email or web address]

## 2021-06-12 NOTE — Telephone Encounter (Signed)
LVM for patient to call so I can verify insurance for PA.

## 2021-06-12 NOTE — Progress Notes (Signed)
Patient reports that she feels well and does not wish to have fluids today

## 2021-06-13 ENCOUNTER — Ambulatory Visit: Payer: No Typology Code available for payment source

## 2021-06-17 ENCOUNTER — Inpatient Hospital Stay: Payer: PRIVATE HEALTH INSURANCE

## 2021-06-17 ENCOUNTER — Ambulatory Visit: Payer: No Typology Code available for payment source

## 2021-06-17 ENCOUNTER — Other Ambulatory Visit: Payer: Self-pay | Admitting: Oncology

## 2021-06-17 ENCOUNTER — Encounter: Payer: Self-pay | Admitting: Oncology

## 2021-06-17 ENCOUNTER — Other Ambulatory Visit: Payer: Self-pay

## 2021-06-17 VITALS — BP 117/69 | HR 122 | Temp 97.5°F | Resp 16 | Ht 68.0 in | Wt 183.0 lb

## 2021-06-17 DIAGNOSIS — C182 Malignant neoplasm of ascending colon: Secondary | ICD-10-CM

## 2021-06-17 DIAGNOSIS — Z5111 Encounter for antineoplastic chemotherapy: Secondary | ICD-10-CM | POA: Diagnosis not present

## 2021-06-17 MED ORDER — FILGRASTIM-SNDZ 480 MCG/0.8ML IJ SOSY
480.0000 ug | PREFILLED_SYRINGE | Freq: Once | INTRAMUSCULAR | Status: AC
  Administered 2021-06-17: 480 ug via SUBCUTANEOUS
  Filled 2021-06-17: qty 0.8

## 2021-06-17 NOTE — Patient Instructions (Signed)
Filgrastim, G-CSF injection °What is this medication? °FILGRASTIM, G-CSF (fil GRA stim) is a granulocyte colony-stimulating factor that stimulates the growth of neutrophils, a type of white blood cell (WBC) important in the body's fight against infection. It is used to reduce the incidence of fever and infection in patients with certain types of cancer who are receiving chemotherapy that affects the bone marrow, to stimulate blood cell production for removal of WBCs from the body prior to a bone marrow transplantation, to reduce the incidence of fever and infection in patients who have severe chronic neutropenia, and to improve survival outcomes following high-dose radiation exposure that is toxic to the bone marrow. °This medicine may be used for other purposes; ask your health care provider or pharmacist if you have questions. °COMMON BRAND NAME(S): Neupogen, Nivestym, Releuko, Zarxio °What should I tell my care team before I take this medication? °They need to know if you have any of these conditions: °kidney disease °latex allergy °ongoing radiation therapy °sickle cell disease °an unusual or allergic reaction to filgrastim, pegfilgrastim, other medicines, foods, dyes, or preservatives °pregnant or trying to get pregnant °breast-feeding °How should I use this medication? °This medicine is for injection under the skin or infusion into a vein. As an infusion into a vein, it is usually given by a health care professional in a hospital or clinic setting. If you get this medicine at home, you will be taught how to prepare and give this medicine. Refer to the Instructions for Use that come with your medication packaging. Use exactly as directed. Take your medicine at regular intervals. Do not take your medicine more often than directed. °It is important that you put your used needles and syringes in a special sharps container. Do not put them in a trash can. If you do not have a sharps container, call your pharmacist  or healthcare provider to get one. °Talk to your pediatrician regarding the use of this medicine in children. While this drug may be prescribed for children as young as 7 months for selected conditions, precautions do apply. °Overdosage: If you think you have taken too much of this medicine contact a poison control center or emergency room at once. °NOTE: This medicine is only for you. Do not share this medicine with others. °What if I miss a dose? °It is important not to miss your dose. Call your doctor or health care professional if you miss a dose. °What may interact with this medication? °This medicine may interact with the following medications: °medicines that may cause a release of neutrophils, such as lithium °This list may not describe all possible interactions. Give your health care provider a list of all the medicines, herbs, non-prescription drugs, or dietary supplements you use. Also tell them if you smoke, drink alcohol, or use illegal drugs. Some items may interact with your medicine. °What should I watch for while using this medication? °Your condition will be monitored carefully while you are receiving this medicine. °You may need blood work done while you are taking this medicine. °Talk to your health care provider about your risk of cancer. You may be more at risk for certain types of cancer if you take this medicine. °What side effects may I notice from receiving this medication? °Side effects that you should report to your doctor or health care professional as soon as possible: °allergic reactions like skin rash, itching or hives, swelling of the face, lips, or tongue °back pain °dizziness or feeling faint °fever °pain, redness, or   irritation at site where injected °pinpoint red spots on the skin °shortness of breath or breathing problems °signs and symptoms of kidney injury like trouble passing urine, change in the amount of urine, or red or dark-brown urine °stomach or side pain, or pain at  the shoulder °swelling °tiredness °unusual bleeding or bruising °Side effects that usually do not require medical attention (report to your doctor or health care professional if they continue or are bothersome): °bone pain °cough °diarrhea °hair loss °headache °muscle pain °This list may not describe all possible side effects. Call your doctor for medical advice about side effects. You may report side effects to FDA at 1-800-FDA-1088. °Where should I keep my medication? °Keep out of the reach of children. °Store in a refrigerator between 2 and 8 degrees C (36 and 46 degrees F). Do not freeze. Keep in carton to protect from light. Throw away this medicine if vials or syringes are left out of the refrigerator for more than 24 hours. Throw away any unused medicine after the expiration date. °NOTE: This sheet is a summary. It may not cover all possible information. If you have questions about this medicine, talk to your doctor, pharmacist, or health care provider. °© 2022 [employer] (2021-02-04 00:00:00) ° °

## 2021-06-17 NOTE — Progress Notes (Signed)
[facility]  [address] [place],  [place]  [address] [phone]  Clinic Day:  06/23/2020  Referring physician: Greig Right, MD  This document serves as a record of services personally performed by Karsten Howry Macarthur Critchley, MD. It was created on their behalf by [facility], a trained medical scribe. The creation of this record is based on the scribe's personal observations and the provider's statements to them.  HISTORY OF PRESENT ILLNESS:  The patient is a 56 y.o. female with metastatic colon cancer, which includes spread of disease to her abdominal cavity, lungs, brain and right adrenal gland. She comes in today prior to her 23rd cycle of FOLFIRI/Avastin.  The patient claims to have toleraed her 22nd cycle of treatment fairly well.  She has had both diarrhea and constipation over these past weeks. Overall, she is doing well.  She denies having any new symptoms/findings which concern her for disease progression.     With respect to her colon cancer history, she is status post a right hemicolectomy in early October 2018, followed by 12 cycles of adjuvant chemotherapy, which were completed in April 2019.  In December 2021, she underwent a left cerebellar metastatectomy, whose pathology was consistent with metastatic colon cancer.  Tumor testing did come back MMR normal.  CT scans recently showed evidence of her cancer being in multiple locations, which has her on palliative chemotherapy at this time.    PHYSICAL EXAM:  Blood pressure 117/75, pulse (!) 107, temperature 98 F (36.7 C), resp. rate 16, height 5' 8"  (1.727 m), weight 185 lb 12.8 oz (84.3 kg), last menstrual period 02/22/2019, SpO2 98 %. Wt Readings from Last 3 Encounters:  06/23/21 185 lb 12.8 oz (84.3 kg)  06/18/21 182 lb (82.6 kg)  06/17/21 183 lb (83 kg)   Body mass index is 28.25 kg/m. Performance status (ECOG): 1 Physical Exam Constitutional:      General: She is not in acute  distress.    Appearance: Normal appearance. She is normal weight. She is not ill-appearing.  HENT:     Head: Normocephalic and atraumatic.  Eyes:     General: No scleral icterus.    Extraocular Movements: Extraocular movements intact.     Conjunctiva/sclera: Conjunctivae normal.     Pupils: Pupils are equal, round, and reactive to light.  Cardiovascular:     Rate and Rhythm: Regular rhythm. Tachycardia present.     Pulses: Normal pulses.     Heart sounds: Normal heart sounds. No murmur heard.   No friction rub. No gallop.  Pulmonary:     Effort: Pulmonary effort is normal. No respiratory distress.     Breath sounds: Normal breath sounds.  Abdominal:     General: Bowel sounds are normal. There is no distension.     Palpations: Abdomen is soft. There is no hepatomegaly, splenomegaly or mass.     Tenderness: There is no abdominal tenderness.  Musculoskeletal:        General: Normal range of motion.     Cervical back: Normal range of motion and neck supple.     Right lower leg: No edema.     Left lower leg: No edema.  Lymphadenopathy:     Cervical: No cervical adenopathy.  Skin:    General: Skin is warm and dry.  Neurological:     General: No focal deficit present.     Mental Status: She is alert and oriented to person, place, and time. Mental status is at baseline.  Psychiatric:        Mood and Affect: Mood normal.        Behavior: Behavior normal.        Thought Content: Thought content normal.        Judgment: Judgment normal.   LABS:    Latest Reference Range & Units 06/23/21 00:00  Sodium 137 - 147  139  Potassium 3.4 - 5.3  3.8  Chloride 99 - 108  104  CO2 13 - 22  24 !  Glucose  159  BUN 4 - 21  12  Creatinine 0.5 - 1.1  1.3 !  Calcium 8.7 - 10.7  9.4  Alkaline Phosphatase 25 - 125  142 !  Albumin 3.5 - 5.0  4.4  AST 13 - 35  33  ALT 7 - 35  38 !  Bilirubin, Total  0.3  WBC  3.0  RBC 3.87 - 5.11  4.03  Hemoglobin 12.0 - 16.0  13.2  HCT 36 - 46  40   Platelets 150 - 399  121 !  NEUT#  1.29  !: Data is abnormal ASSESSMENT & PLAN:  Assessment/Plan:  A 55 y.o. female with metastatic colon cancer.  She will proceed with her 23rd cycle of FOLFIRI/Avastin this week.  Clinically, she appears to be doing well.  Due to her relative degree of neutropenia, I will arrange for her to receive 3 consecutive days of Zarxio during her midcycle to prevent severe neutropenia from delaying her next cycle of treatment.otherwise, I will see her back in 2 weeks before she heads into her 24th cycle of FOLFIRI/Avastin. The patient understands all the plans discussed today and is in agreement with them.     I, Rita Ohara, am acting as scribe for Marice Potter, MD    I have reviewed this report as typed by the medical scribe, and it is complete and accurate.  Raffael Bugarin Macarthur Critchley, MD

## 2021-06-18 ENCOUNTER — Inpatient Hospital Stay: Payer: PRIVATE HEALTH INSURANCE

## 2021-06-18 VITALS — BP 120/75 | HR 117 | Temp 98.2°F | Resp 18 | Wt 182.0 lb

## 2021-06-18 DIAGNOSIS — Z5111 Encounter for antineoplastic chemotherapy: Secondary | ICD-10-CM | POA: Diagnosis not present

## 2021-06-18 DIAGNOSIS — C182 Malignant neoplasm of ascending colon: Secondary | ICD-10-CM

## 2021-06-18 MED ORDER — FILGRASTIM-SNDZ 480 MCG/0.8ML IJ SOSY
480.0000 ug | PREFILLED_SYRINGE | Freq: Once | INTRAMUSCULAR | Status: AC
  Administered 2021-06-18: 480 ug via SUBCUTANEOUS
  Filled 2021-06-18: qty 0.8

## 2021-06-18 NOTE — Patient Instructions (Signed)
Filgrastim, G-CSF injection °What is this medication? °FILGRASTIM, G-CSF (fil GRA stim) is a granulocyte colony-stimulating factor that stimulates the growth of neutrophils, a type of white blood cell (WBC) important in the body's fight against infection. It is used to reduce the incidence of fever and infection in patients with certain types of cancer who are receiving chemotherapy that affects the bone marrow, to stimulate blood cell production for removal of WBCs from the body prior to a bone marrow transplantation, to reduce the incidence of fever and infection in patients who have severe chronic neutropenia, and to improve survival outcomes following high-dose radiation exposure that is toxic to the bone marrow. °This medicine may be used for other purposes; ask your health care provider or pharmacist if you have questions. °COMMON BRAND NAME(S): Neupogen, Nivestym, Releuko, Zarxio °What should I tell my care team before I take this medication? °They need to know if you have any of these conditions: °kidney disease °latex allergy °ongoing radiation therapy °sickle cell disease °an unusual or allergic reaction to filgrastim, pegfilgrastim, other medicines, foods, dyes, or preservatives °pregnant or trying to get pregnant °breast-feeding °How should I use this medication? °This medicine is for injection under the skin or infusion into a vein. As an infusion into a vein, it is usually given by a health care professional in a hospital or clinic setting. If you get this medicine at home, you will be taught how to prepare and give this medicine. Refer to the Instructions for Use that come with your medication packaging. Use exactly as directed. Take your medicine at regular intervals. Do not take your medicine more often than directed. °It is important that you put your used needles and syringes in a special sharps container. Do not put them in a trash can. If you do not have a sharps container, call your pharmacist  or healthcare provider to get one. °Talk to your pediatrician regarding the use of this medicine in children. While this drug may be prescribed for children as young as 7 months for selected conditions, precautions do apply. °Overdosage: If you think you have taken too much of this medicine contact a poison control center or emergency room at once. °NOTE: This medicine is only for you. Do not share this medicine with others. °What if I miss a dose? °It is important not to miss your dose. Call your doctor or health care professional if you miss a dose. °What may interact with this medication? °This medicine may interact with the following medications: °medicines that may cause a release of neutrophils, such as lithium °This list may not describe all possible interactions. Give your health care provider a list of all the medicines, herbs, non-prescription drugs, or dietary supplements you use. Also tell them if you smoke, drink alcohol, or use illegal drugs. Some items may interact with your medicine. °What should I watch for while using this medication? °Your condition will be monitored carefully while you are receiving this medicine. °You may need blood work done while you are taking this medicine. °Talk to your health care provider about your risk of cancer. You may be more at risk for certain types of cancer if you take this medicine. °What side effects may I notice from receiving this medication? °Side effects that you should report to your doctor or health care professional as soon as possible: °allergic reactions like skin rash, itching or hives, swelling of the face, lips, or tongue °back pain °dizziness or feeling faint °fever °pain, redness, or   irritation at site where injected °pinpoint red spots on the skin °shortness of breath or breathing problems °signs and symptoms of kidney injury like trouble passing urine, change in the amount of urine, or red or dark-brown urine °stomach or side pain, or pain at  the shoulder °swelling °tiredness °unusual bleeding or bruising °Side effects that usually do not require medical attention (report to your doctor or health care professional if they continue or are bothersome): °bone pain °cough °diarrhea °hair loss °headache °muscle pain °This list may not describe all possible side effects. Call your doctor for medical advice about side effects. You may report side effects to FDA at 1-800-FDA-1088. °Where should I keep my medication? °Keep out of the reach of children. °Store in a refrigerator between 2 and 8 degrees C (36 and 46 degrees F). Do not freeze. Keep in carton to protect from light. Throw away this medicine if vials or syringes are left out of the refrigerator for more than 24 hours. Throw away any unused medicine after the expiration date. °NOTE: This sheet is a summary. It may not cover all possible information. If you have questions about this medicine, talk to your doctor, pharmacist, or health care provider. °© 2022 [employer] (2021-02-04 00:00:00) ° °

## 2021-06-19 ENCOUNTER — Ambulatory Visit: Payer: PRIVATE HEALTH INSURANCE

## 2021-06-20 ENCOUNTER — Ambulatory Visit: Payer: Self-pay

## 2021-06-23 ENCOUNTER — Encounter: Payer: Self-pay | Admitting: Oncology

## 2021-06-23 ENCOUNTER — Inpatient Hospital Stay (INDEPENDENT_AMBULATORY_CARE_PROVIDER_SITE_OTHER): Payer: PRIVATE HEALTH INSURANCE | Admitting: Oncology

## 2021-06-23 ENCOUNTER — Telehealth: Payer: Self-pay | Admitting: Oncology

## 2021-06-23 ENCOUNTER — Inpatient Hospital Stay: Payer: PRIVATE HEALTH INSURANCE

## 2021-06-23 ENCOUNTER — Other Ambulatory Visit: Payer: Self-pay

## 2021-06-23 VITALS — BP 117/75 | HR 107 | Temp 98.0°F | Resp 16 | Ht 68.0 in | Wt 185.8 lb

## 2021-06-23 DIAGNOSIS — C182 Malignant neoplasm of ascending colon: Secondary | ICD-10-CM

## 2021-06-23 DIAGNOSIS — R197 Diarrhea, unspecified: Secondary | ICD-10-CM

## 2021-06-23 LAB — COMPREHENSIVE METABOLIC PANEL
Albumin: 4.4 (ref 3.5–5.0)
Calcium: 9.4 (ref 8.7–10.7)

## 2021-06-23 LAB — CBC AND DIFFERENTIAL
HCT: 40 (ref 36–46)
Hemoglobin: 13.2 (ref 12.0–16.0)
Neutrophils Absolute: 1.29
Platelets: 121 — AB (ref 150–399)
WBC: 3

## 2021-06-23 LAB — BASIC METABOLIC PANEL
BUN: 12 (ref 4–21)
CO2: 24 — AB (ref 13–22)
Chloride: 104 (ref 99–108)
Creatinine: 1.3 — AB (ref 0.5–1.1)
Glucose: 159
Potassium: 3.8 (ref 3.4–5.3)
Sodium: 139 (ref 137–147)

## 2021-06-23 LAB — CBC: RBC: 4.03 (ref 3.87–5.11)

## 2021-06-23 LAB — HEPATIC FUNCTION PANEL
ALT: 38 — AB (ref 7–35)
AST: 33 (ref 13–35)
Alkaline Phosphatase: 142 — AB (ref 25–125)
Bilirubin, Total: 0.3

## 2021-06-23 MED FILL — Irinotecan HCl Inj 100 MG/5ML (20 MG/ML): INTRAVENOUS | Qty: 14 | Status: AC

## 2021-06-23 MED FILL — Bevacizumab-bvzr IV Soln 400 MG/16ML (For Infusion): INTRAVENOUS | Qty: 18 | Status: AC

## 2021-06-23 MED FILL — Leucovorin Calcium For Inj 350 MG: INTRAMUSCULAR | Qty: 41.8 | Status: AC

## 2021-06-23 MED FILL — Fosaprepitant Dimeglumine For IV Infusion 150 MG (Base Eq): INTRAVENOUS | Qty: 5 | Status: AC

## 2021-06-23 MED FILL — Dexamethasone Sodium Phosphate Inj 100 MG/10ML: INTRAMUSCULAR | Qty: 1 | Status: AC

## 2021-06-23 MED FILL — Fluorouracil IV Soln 5 GM/100ML (50 MG/ML): INTRAVENOUS | Qty: 100 | Status: AC

## 2021-06-23 NOTE — Telephone Encounter (Signed)
Patient rescheduled today's Appt to Labs 3:45 pm - Follow Up 4;15 pm.  Ok per [facility]

## 2021-06-24 ENCOUNTER — Inpatient Hospital Stay: Payer: PRIVATE HEALTH INSURANCE

## 2021-06-24 ENCOUNTER — Other Ambulatory Visit: Payer: PRIVATE HEALTH INSURANCE

## 2021-06-24 ENCOUNTER — Ambulatory Visit: Payer: PRIVATE HEALTH INSURANCE | Admitting: Oncology

## 2021-06-24 VITALS — BP 117/65 | HR 105 | Temp 97.5°F | Resp 16 | Ht 68.0 in | Wt 186.0 lb

## 2021-06-24 DIAGNOSIS — C182 Malignant neoplasm of ascending colon: Secondary | ICD-10-CM

## 2021-06-24 DIAGNOSIS — Z5111 Encounter for antineoplastic chemotherapy: Secondary | ICD-10-CM | POA: Diagnosis not present

## 2021-06-24 MED ORDER — ATROPINE SULFATE 1 MG/ML IV SOLN
0.5000 mg | Freq: Once | INTRAVENOUS | Status: AC | PRN
  Administered 2021-06-24: 0.5 mg via INTRAVENOUS
  Filled 2021-06-24: qty 1

## 2021-06-24 MED ORDER — SODIUM CHLORIDE 0.9 % IV SOLN
10.0000 mg | Freq: Once | INTRAVENOUS | Status: AC
  Administered 2021-06-24: 10 mg via INTRAVENOUS
  Filled 2021-06-24: qty 10

## 2021-06-24 MED ORDER — SODIUM CHLORIDE 0.9 % IV SOLN
5.0000 mg/kg | Freq: Once | INTRAVENOUS | Status: AC
  Administered 2021-06-24: 450 mg via INTRAVENOUS
  Filled 2021-06-24: qty 16

## 2021-06-24 MED ORDER — LEUCOVORIN CALCIUM INJECTION 350 MG
400.0000 mg/m2 | Freq: Once | INTRAVENOUS | Status: AC
  Administered 2021-06-24: 836 mg via INTRAVENOUS
  Filled 2021-06-24: qty 41.8

## 2021-06-24 MED ORDER — FLUOROURACIL CHEMO INJECTION 2.5 GM/50ML
400.0000 mg/m2 | Freq: Once | INTRAVENOUS | Status: AC
  Administered 2021-06-24: 850 mg via INTRAVENOUS
  Filled 2021-06-24: qty 17

## 2021-06-24 MED ORDER — SODIUM CHLORIDE 0.9 % IV SOLN: Freq: Once | INTRAVENOUS | Status: AC

## 2021-06-24 MED ORDER — SODIUM CHLORIDE 0.9 % IV SOLN: Freq: Once | INTRAVENOUS | Status: DC

## 2021-06-24 MED ORDER — SODIUM CHLORIDE 0.9 % IV SOLN
150.0000 mg | Freq: Once | INTRAVENOUS | Status: AC
  Administered 2021-06-24: 150 mg via INTRAVENOUS
  Filled 2021-06-24: qty 150

## 2021-06-24 MED ORDER — SODIUM CHLORIDE 0.9% FLUSH: 10.0000 mL | INTRAVENOUS | Status: DC | PRN

## 2021-06-24 MED ORDER — SODIUM CHLORIDE 0.9 % IV SOLN
2400.0000 mg/m2 | INTRAVENOUS | Status: DC
  Administered 2021-06-24: 5000 mg via INTRAVENOUS
  Filled 2021-06-24: qty 100

## 2021-06-24 MED ORDER — PALONOSETRON HCL INJECTION 0.25 MG/5ML
0.2500 mg | Freq: Once | INTRAVENOUS | Status: AC
  Administered 2021-06-24: 0.25 mg via INTRAVENOUS
  Filled 2021-06-24: qty 5

## 2021-06-24 MED ORDER — IRINOTECAN HCL CHEMO INJECTION 100 MG/5ML
135.0000 mg/m2 | Freq: Once | INTRAVENOUS | Status: AC
  Administered 2021-06-24: 280 mg via INTRAVENOUS
  Filled 2021-06-24: qty 10

## 2021-06-24 MED ORDER — HEPARIN SOD (PORK) LOCK FLUSH 100 UNIT/ML IV SOLN: 500.0000 [IU] | Freq: Once | INTRAVENOUS | Status: DC | PRN

## 2021-06-24 NOTE — Patient Instructions (Signed)
Fluorouracil, 5-FU injection What is this medication? FLUOROURACIL, 5-FU (flure oh YOOR a sil) is a chemotherapy drug. It slows the growth of cancer cells. This medicine is used to treat many types of cancer like breast cancer, colon or rectal cancer, pancreatic cancer, and stomach cancer. This medicine may be used for other purposes; ask your health care provider or pharmacist if you have questions. COMMON BRAND NAME(S): Adrucil What should I tell my care team before I take this medication? They need to know if you have any of these conditions: blood disorders dihydropyrimidine dehydrogenase (DPD) deficiency infection (especially a virus infection such as chickenpox, cold sores, or herpes) kidney disease liver disease malnourished, poor nutrition recent or ongoing radiation therapy an unusual or allergic reaction to fluorouracil, other chemotherapy, other medicines, foods, dyes, or preservatives pregnant or trying to get pregnant breast-feeding How should I use this medication? This drug is given as an infusion or injection into a vein. It is administered in a hospital or clinic by a specially trained health care professional. Talk to your pediatrician regarding the use of this medicine in children. Special care may be needed. Overdosage: If you think you have taken too much of this medicine contact a poison control center or emergency room at once. NOTE: This medicine is only for you. Do not share this medicine with others. What if I miss a dose? It is important not to miss your dose. Call your doctor or health care professional if you are unable to keep an appointment. What may interact with this medication? Do not take this medicine with any of the following medications: live virus vaccines This medicine may also interact with the following medications: medicines that treat or prevent blood clots like warfarin, enoxaparin, and dalteparin This list may not describe all possible  interactions. Give your health care provider a list of all the medicines, herbs, non-prescription drugs, or dietary supplements you use. Also tell them if you smoke, drink alcohol, or use illegal drugs. Some items may interact with your medicine. What should I watch for while using this medication? Visit your doctor for checks on your progress. This drug may make you feel generally unwell. This is not uncommon, as chemotherapy can affect healthy cells as well as cancer cells. Report any side effects. Continue your course of treatment even though you feel ill unless your doctor tells you to stop. In some cases, you may be given additional medicines to help with side effects. Follow all directions for their use. Call your doctor or health care professional for advice if you get a fever, chills or sore throat, or other symptoms of a cold or flu. Do not treat yourself. This drug decreases your body's ability to fight infections. Try to avoid being around people who are sick. This medicine may increase your risk to bruise or bleed. Call your doctor or health care professional if you notice any unusual bleeding. Be careful brushing and flossing your teeth or using a toothpick because you may get an infection or bleed more easily. If you have any dental work done, tell your dentist you are receiving this medicine. Avoid taking products that contain aspirin, acetaminophen, ibuprofen, naproxen, or ketoprofen unless instructed by your doctor. These medicines may hide a fever. Do not become pregnant while taking this medicine. Women should inform their doctor if they wish to become pregnant or think they might be pregnant. There is a potential for serious side effects to an unborn child. Talk to your health care  professional or pharmacist for more information. Do not breast-feed an infant while taking this medicine. Men should inform their doctor if they wish to father a child. This medicine may lower sperm  counts. Do not treat diarrhea with over the counter products. Contact your doctor if you have diarrhea that lasts more than 2 days or if it is severe and watery. This medicine can make you more sensitive to the sun. Keep out of the sun. If you cannot avoid being in the sun, wear protective clothing and use sunscreen. Do not use sun lamps or tanning beds/booths. What side effects may I notice from receiving this medication? Side effects that you should report to your doctor or health care professional as soon as possible: allergic reactions like skin rash, itching or hives, swelling of the face, lips, or tongue low blood counts - this medicine may decrease the number of white blood cells, red blood cells and platelets. You may be at increased risk for infections and bleeding. signs of infection - fever or chills, cough, sore throat, pain or difficulty passing urine signs of decreased platelets or bleeding - bruising, pinpoint red spots on the skin, black, tarry stools, blood in the urine signs of decreased red blood cells - unusually weak or tired, fainting spells, lightheadedness breathing problems changes in vision chest pain mouth sores nausea and vomiting pain, swelling, redness at site where injected pain, tingling, numbness in the hands or feet redness, swelling, or sores on hands or feet stomach pain unusual bleeding Side effects that usually do not require medical attention (report to your doctor or health care professional if they continue or are bothersome): changes in finger or toe nails diarrhea dry or itchy skin hair loss headache loss of appetite sensitivity of eyes to the light stomach upset unusually teary eyes This list may not describe all possible side effects. Call your doctor for medical advice about side effects. You may report side effects to FDA at 1-800-FDA-1088. Where should I keep my medication? This drug is given in a hospital or clinic and will not be  stored at home. NOTE: This sheet is a summary. It may not cover all possible information. If you have questions about this medicine, talk to your doctor, pharmacist, or health care provider.  2022 [employer] (2021-02-04 00:00:00) Leucovorin injection What is this medication? LEUCOVORIN (loo koe VOR in) is used to prevent or treat the harmful effects of some medicines. This medicine is used to treat anemia caused by a low amount of folic acid in the body. It is also used with 5-fluorouracil (5-FU) to treat colon cancer. This medicine may be used for other purposes; ask your health care provider or pharmacist if you have questions. What should I tell my care team before I take this medication? They need to know if you have any of these conditions: anemia from low levels of vitamin B-12 in the blood an unusual or allergic reaction to leucovorin, folic acid, other medicines, foods, dyes, or preservatives pregnant or trying to get pregnant breast-feeding How should I use this medication? This medicine is for injection into a muscle or into a vein. It is given by a health care professional in a hospital or clinic setting. Talk to your pediatrician regarding the use of this medicine in children. Special care may be needed. Overdosage: If you think you have taken too much of this medicine contact a poison control center or emergency room at once. NOTE: This medicine is only for you.  Do not share this medicine with others. What if I miss a dose? This does not apply. What may interact with this medication? capecitabine fluorouracil phenobarbital phenytoin primidone trimethoprim-sulfamethoxazole This list may not describe all possible interactions. Give your health care provider a list of all the medicines, herbs, non-prescription drugs, or dietary supplements you use. Also tell them if you smoke, drink alcohol, or use illegal drugs. Some items may interact with your medicine. What  should I watch for while using this medication? Your condition will be monitored carefully while you are receiving this medicine. This medicine may increase the side effects of 5-fluorouracil, 5-FU. Tell your doctor or health care professional if you have diarrhea or mouth sores that do not get better or that get worse. What side effects may I notice from receiving this medication? Side effects that you should report to your doctor or health care professional as soon as possible: allergic reactions like skin rash, itching or hives, swelling of the face, lips, or tongue breathing problems fever, infection mouth sores unusual bleeding or bruising unusually weak or tired Side effects that usually do not require medical attention (report to your doctor or health care professional if they continue or are bothersome): constipation or diarrhea loss of appetite nausea, vomiting This list may not describe all possible side effects. Call your doctor for medical advice about side effects. You may report side effects to FDA at 1-800-FDA-1088. Where should I keep my medication? This drug is given in a hospital or clinic and will not be stored at home. NOTE: This sheet is a summary. It may not cover all possible information. If you have questions about this medicine, talk to your doctor, pharmacist, or health care provider.  2022 [employer] (2007-11-24 00:00:00) Irinotecan injection What is this medication? IRINOTECAN (ir in oh TEE kan ) is a chemotherapy drug. It is used to treat colon and rectal cancer. This medicine may be used for other purposes; ask your health care provider or pharmacist if you have questions. COMMON BRAND NAME(S): Camptosar What should I tell my care team before I take this medication? They need to know if you have any of these conditions: dehydration diarrhea infection (especially a virus infection such as chickenpox, cold sores, or herpes) liver disease low  blood counts, like low white cell, platelet, or red cell counts low levels of calcium, magnesium, or potassium in the blood recent or ongoing radiation therapy an unusual or allergic reaction to irinotecan, other medicines, foods, dyes, or preservatives pregnant or trying to get pregnant breast-feeding How should I use this medication? This drug is given as an infusion into a vein. It is administered in a hospital or clinic by a specially trained health care professional. Talk to your pediatrician regarding the use of this medicine in children. Special care may be needed. Overdosage: If you think you have taken too much of this medicine contact a poison control center or emergency room at once. NOTE: This medicine is only for you. Do not share this medicine with others. What if I miss a dose? It is important not to miss your dose. Call your doctor or health care professional if you are unable to keep an appointment. What may interact with this medication? Do not take this medicine with any of the following medications: cobicistat itraconazole This medicine may interact with the following medications: antiviral medicines for HIV or AIDS certain antibiotics like rifampin or rifabutin certain medicines for fungal infections like ketoconazole, posaconazole, and voriconazole certain  medicines for seizures like carbamazepine, phenobarbital, phenotoin clarithromycin gemfibrozil nefazodone St. John's Wort This list may not describe all possible interactions. Give your health care provider a list of all the medicines, herbs, non-prescription drugs, or dietary supplements you use. Also tell them if you smoke, drink alcohol, or use illegal drugs. Some items may interact with your medicine. What should I watch for while using this medication? Your condition will be monitored carefully while you are receiving this medicine. You will need important blood work done while you are taking this  medicine. This drug may make you feel generally unwell. This is not uncommon, as chemotherapy can affect healthy cells as well as cancer cells. Report any side effects. Continue your course of treatment even though you feel ill unless your doctor tells you to stop. In some cases, you may be given additional medicines to help with side effects. Follow all directions for their use. You may get drowsy or dizzy. Do not drive, use machinery, or do anything that needs mental alertness until you know how this medicine affects you. Do not stand or sit up quickly, especially if you are an older patient. This reduces the risk of dizzy or fainting spells. Call your health care professional for advice if you get a fever, chills, or sore throat, or other symptoms of a cold or flu. Do not treat yourself. This medicine decreases your body's ability to fight infections. Try to avoid being around people who are sick. Avoid taking products that contain aspirin, acetaminophen, ibuprofen, naproxen, or ketoprofen unless instructed by your doctor. These medicines may hide a fever. This medicine may increase your risk to bruise or bleed. Call your doctor or health care professional if you notice any unusual bleeding. Be careful brushing and flossing your teeth or using a toothpick because you may get an infection or bleed more easily. If you have any dental work done, tell your dentist you are receiving this medicine. Do not become pregnant while taking this medicine or for 6 months after stopping it. Women should inform their health care professional if they wish to become pregnant or think they might be pregnant. Men should not father a child while taking this medicine and for 3 months after stopping it. There is potential for serious side effects to an unborn child. Talk to your health care professional for more information. Do not breast-feed an infant while taking this medicine or for 7 days after stopping it. This medicine  has caused ovarian failure in some women. This medicine may make it more difficult to get pregnant. Talk to your health care professional if you are concerned about your fertility. This medicine has caused decreased sperm counts in some men. This may make it more difficult to father a child. Talk to your health care professional if you are concerned about your fertility. What side effects may I notice from receiving this medication? Side effects that you should report to your doctor or health care professional as soon as possible: allergic reactions like skin rash, itching or hives, swelling of the face, lips, or tongue chest pain diarrhea flushing, runny nose, sweating during infusion low blood counts - this medicine may decrease the number of white blood cells, red blood cells and platelets. You may be at increased risk for infections and bleeding. nausea, vomiting pain, swelling, warmth in the leg signs of decreased platelets or bleeding - bruising, pinpoint red spots on the skin, black, tarry stools, blood in the urine signs of infection -  fever or chills, cough, sore throat, pain or difficulty passing urine signs of decreased red blood cells - unusually weak or tired, fainting spells, lightheadedness Side effects that usually do not require medical attention (report to your doctor or health care professional if they continue or are bothersome): constipation hair loss headache loss of appetite mouth sores stomach pain This list may not describe all possible side effects. Call your doctor for medical advice about side effects. You may report side effects to FDA at 1-800-FDA-1088. Where should I keep my medication? This drug is given in a hospital or clinic and will not be stored at home. NOTE: This sheet is a summary. It may not cover all possible information. If you have questions about this medicine, talk to your doctor, pharmacist, or health care provider.  2022 [employer]  (2021-02-04 00:00:00) Bevacizumab injection What is this medication? BEVACIZUMAB (be va SIZ yoo mab) is a monoclonal antibody. It is used to treat many types of cancer. This medicine may be used for other purposes; ask your health care provider or pharmacist if you have questions. COMMON BRAND NAME(S): Alymsys, Avastin, MVASI, Noah Charon What should I tell my care team before I take this medication? They need to know if you have any of these conditions: diabetes heart disease high blood pressure history of coughing up blood prior anthracycline chemotherapy (e.g., doxorubicin, daunorubicin, epirubicin) recent or ongoing radiation therapy recent or planning to have surgery stroke an unusual or allergic reaction to bevacizumab, hamster proteins, mouse proteins, other medicines, foods, dyes, or preservatives pregnant or trying to get pregnant breast-feeding How should I use this medication? This medicine is for infusion into a vein. It is given by a health care professional in a hospital or clinic setting. Talk to your pediatrician regarding the use of this medicine in children. Special care may be needed. Overdosage: If you think you have taken too much of this medicine contact a poison control center or emergency room at once. NOTE: This medicine is only for you. Do not share this medicine with others. What if I miss a dose? It is important not to miss your dose. Call your doctor or health care professional if you are unable to keep an appointment. What may interact with this medication? Interactions are not expected. This list may not describe all possible interactions. Give your health care provider a list of all the medicines, herbs, non-prescription drugs, or dietary supplements you use. Also tell them if you smoke, drink alcohol, or use illegal drugs. Some items may interact with your medicine. What should I watch for while using this medication? Your condition will be monitored  carefully while you are receiving this medicine. You will need important blood work and urine testing done while you are taking this medicine. This medicine may increase your risk to bruise or bleed. Call your doctor or health care professional if you notice any unusual bleeding. Before having surgery, talk to your health care provider to make sure it is ok. This drug can increase the risk of poor healing of your surgical site or wound. You will need to stop this drug for 28 days before surgery. After surgery, wait at least 28 days before restarting this drug. Make sure the surgical site or wound is healed enough before restarting this drug. Talk to your health care provider if questions. Do not become pregnant while taking this medicine or for 6 months after stopping it. Women should inform their doctor if they wish to become pregnant  or think they might be pregnant. There is a potential for serious side effects to an unborn child. Talk to your health care professional or pharmacist for more information. Do not breast-feed an infant while taking this medicine and for 6 months after the last dose. This medicine has caused ovarian failure in some women. This medicine may interfere with the ability to have a child. You should talk to your doctor or health care professional if you are concerned about your fertility. What side effects may I notice from receiving this medication? Side effects that you should report to your doctor or health care professional as soon as possible: allergic reactions like skin rash, itching or hives, swelling of the face, lips, or tongue chest pain or chest tightness chills coughing up blood high fever seizures severe constipation signs and symptoms of bleeding such as bloody or black, tarry stools; red or dark-brown urine; spitting up blood or brown material that looks like coffee grounds; red spots on the skin; unusual bruising or bleeding from the eye, gums, or nose signs  and symptoms of a blood clot such as breathing problems; chest pain; severe, sudden headache; pain, swelling, warmth in the leg signs and symptoms of a stroke like changes in vision; confusion; trouble speaking or understanding; severe headaches; sudden numbness or weakness of the face, arm or leg; trouble walking; dizziness; loss of balance or coordination stomach pain sweating swelling of legs or ankles vomiting weight gain Side effects that usually do not require medical attention (report to your doctor or health care professional if they continue or are bothersome): back pain changes in taste decreased appetite dry skin nausea tiredness This list may not describe all possible side effects. Call your doctor for medical advice about side effects. You may report side effects to FDA at 1-800-FDA-1088. Where should I keep my medication? This drug is given in a hospital or clinic and will not be stored at home. NOTE: This sheet is a summary. It may not cover all possible information. If you have questions about this medicine, talk to your doctor, pharmacist, or health care provider.  2022 [employer] (2021-02-04 00:00:00)

## 2021-06-26 ENCOUNTER — Other Ambulatory Visit: Payer: Self-pay

## 2021-06-26 ENCOUNTER — Inpatient Hospital Stay: Payer: PRIVATE HEALTH INSURANCE

## 2021-06-26 VITALS — BP 112/80 | HR 104 | Resp 16 | Ht 68.0 in | Wt 193.0 lb

## 2021-06-26 DIAGNOSIS — C182 Malignant neoplasm of ascending colon: Secondary | ICD-10-CM

## 2021-06-26 DIAGNOSIS — Z5111 Encounter for antineoplastic chemotherapy: Secondary | ICD-10-CM | POA: Diagnosis not present

## 2021-06-26 MED ORDER — HEPARIN SOD (PORK) LOCK FLUSH 100 UNIT/ML IV SOLN
500.0000 [IU] | Freq: Once | INTRAVENOUS | Status: AC | PRN
  Administered 2021-06-26: 500 [IU]

## 2021-06-26 MED ORDER — SODIUM CHLORIDE 0.9 % IV SOLN: Freq: Once | INTRAVENOUS | Status: AC

## 2021-06-26 MED ORDER — SODIUM CHLORIDE 0.9% FLUSH: 10.0000 mL | INTRAVENOUS | Status: DC | PRN

## 2021-06-26 NOTE — Patient Instructions (Signed)
Fluorouracil, 5-FU injection What is this medication? FLUOROURACIL, 5-FU (flure oh YOOR a sil) is a chemotherapy drug. It slows the growth of cancer cells. This medicine is used to treat many types of cancer like breast cancer, colon or rectal cancer, pancreatic cancer, and stomach cancer. This medicine may be used for other purposes; ask your health care provider or pharmacist if you have questions. COMMON BRAND NAME(S): Adrucil What should I tell my care team before I take this medication? They need to know if you have any of these conditions: blood disorders dihydropyrimidine dehydrogenase (DPD) deficiency infection (especially a virus infection such as chickenpox, cold sores, or herpes) kidney disease liver disease malnourished, poor nutrition recent or ongoing radiation therapy an unusual or allergic reaction to fluorouracil, other chemotherapy, other medicines, foods, dyes, or preservatives pregnant or trying to get pregnant breast-feeding How should I use this medication? This drug is given as an infusion or injection into a vein. It is administered in a hospital or clinic by a specially trained health care professional. Talk to your pediatrician regarding the use of this medicine in children. Special care may be needed. Overdosage: If you think you have taken too much of this medicine contact a poison control center or emergency room at once. NOTE: This medicine is only for you. Do not share this medicine with others. What if I miss a dose? It is important not to miss your dose. Call your doctor or health care professional if you are unable to keep an appointment. What may interact with this medication? Do not take this medicine with any of the following medications: live virus vaccines This medicine may also interact with the following medications: medicines that treat or prevent blood clots like warfarin, enoxaparin, and dalteparin This list may not describe all possible  interactions. Give your health care provider a list of all the medicines, herbs, non-prescription drugs, or dietary supplements you use. Also tell them if you smoke, drink alcohol, or use illegal drugs. Some items may interact with your medicine. What should I watch for while using this medication? Visit your doctor for checks on your progress. This drug may make you feel generally unwell. This is not uncommon, as chemotherapy can affect healthy cells as well as cancer cells. Report any side effects. Continue your course of treatment even though you feel ill unless your doctor tells you to stop. In some cases, you may be given additional medicines to help with side effects. Follow all directions for their use. Call your doctor or health care professional for advice if you get a fever, chills or sore throat, or other symptoms of a cold or flu. Do not treat yourself. This drug decreases your body's ability to fight infections. Try to avoid being around people who are sick. This medicine may increase your risk to bruise or bleed. Call your doctor or health care professional if you notice any unusual bleeding. Be careful brushing and flossing your teeth or using a toothpick because you may get an infection or bleed more easily. If you have any dental work done, tell your dentist you are receiving this medicine. Avoid taking products that contain aspirin, acetaminophen, ibuprofen, naproxen, or ketoprofen unless instructed by your doctor. These medicines may hide a fever. Do not become pregnant while taking this medicine. Women should inform their doctor if they wish to become pregnant or think they might be pregnant. There is a potential for serious side effects to an unborn child. Talk to your health care   professional or pharmacist for more information. Do not breast-feed an infant while taking this medicine. Men should inform their doctor if they wish to father a child. This medicine may lower sperm  counts. Do not treat diarrhea with over the counter products. Contact your doctor if you have diarrhea that lasts more than 2 days or if it is severe and watery. This medicine can make you more sensitive to the sun. Keep out of the sun. If you cannot avoid being in the sun, wear protective clothing and use sunscreen. Do not use sun lamps or tanning beds/booths. What side effects may I notice from receiving this medication? Side effects that you should report to your doctor or health care professional as soon as possible: allergic reactions like skin rash, itching or hives, swelling of the face, lips, or tongue low blood counts - this medicine may decrease the number of white blood cells, red blood cells and platelets. You may be at increased risk for infections and bleeding. signs of infection - fever or chills, cough, sore throat, pain or difficulty passing urine signs of decreased platelets or bleeding - bruising, pinpoint red spots on the skin, black, tarry stools, blood in the urine signs of decreased red blood cells - unusually weak or tired, fainting spells, lightheadedness breathing problems changes in vision chest pain mouth sores nausea and vomiting pain, swelling, redness at site where injected pain, tingling, numbness in the hands or feet redness, swelling, or sores on hands or feet stomach pain unusual bleeding Side effects that usually do not require medical attention (report to your doctor or health care professional if they continue or are bothersome): changes in finger or toe nails diarrhea dry or itchy skin hair loss headache loss of appetite sensitivity of eyes to the light stomach upset unusually teary eyes This list may not describe all possible side effects. Call your doctor for medical advice about side effects. You may report side effects to FDA at 1-800-FDA-1088. Where should I keep my medication? This drug is given in a hospital or clinic and will not be  stored at home. NOTE: This sheet is a summary. It may not cover all possible information. If you have questions about this medicine, talk to your doctor, pharmacist, or health care provider.  2022 [employer] (2021-02-04 00:00:00)  

## 2021-06-30 ENCOUNTER — Encounter: Payer: Self-pay | Admitting: Oncology

## 2021-06-30 ENCOUNTER — Other Ambulatory Visit: Payer: Self-pay

## 2021-06-30 ENCOUNTER — Inpatient Hospital Stay: Payer: PRIVATE HEALTH INSURANCE

## 2021-06-30 VITALS — BP 99/87 | HR 103 | Temp 97.5°F | Resp 18 | Ht 68.0 in | Wt 183.8 lb

## 2021-06-30 DIAGNOSIS — C182 Malignant neoplasm of ascending colon: Secondary | ICD-10-CM

## 2021-06-30 DIAGNOSIS — Z5111 Encounter for antineoplastic chemotherapy: Secondary | ICD-10-CM | POA: Diagnosis not present

## 2021-06-30 MED ORDER — FILGRASTIM-SNDZ 480 MCG/0.8ML IJ SOSY
480.0000 ug | PREFILLED_SYRINGE | Freq: Once | INTRAMUSCULAR | Status: AC
  Administered 2021-06-30: 480 ug via SUBCUTANEOUS
  Filled 2021-06-30: qty 0.8

## 2021-06-30 NOTE — Patient Instructions (Signed)
Filgrastim, G-CSF injection °What is this medication? °FILGRASTIM, G-CSF (fil GRA stim) is a granulocyte colony-stimulating factor that stimulates the growth of neutrophils, a type of white blood cell (WBC) important in the body's fight against infection. It is used to reduce the incidence of fever and infection in patients with certain types of cancer who are receiving chemotherapy that affects the bone marrow, to stimulate blood cell production for removal of WBCs from the body prior to a bone marrow transplantation, to reduce the incidence of fever and infection in patients who have severe chronic neutropenia, and to improve survival outcomes following high-dose radiation exposure that is toxic to the bone marrow. °This medicine may be used for other purposes; ask your health care provider or pharmacist if you have questions. °COMMON BRAND NAME(S): Neupogen, Nivestym, Releuko, Zarxio °What should I tell my care team before I take this medication? °They need to know if you have any of these conditions: °kidney disease °latex allergy °ongoing radiation therapy °sickle cell disease °an unusual or allergic reaction to filgrastim, pegfilgrastim, other medicines, foods, dyes, or preservatives °pregnant or trying to get pregnant °breast-feeding °How should I use this medication? °This medicine is for injection under the skin or infusion into a vein. As an infusion into a vein, it is usually given by a health care professional in a hospital or clinic setting. If you get this medicine at home, you will be taught how to prepare and give this medicine. Refer to the Instructions for Use that come with your medication packaging. Use exactly as directed. Take your medicine at regular intervals. Do not take your medicine more often than directed. °It is important that you put your used needles and syringes in a special sharps container. Do not put them in a trash can. If you do not have a sharps container, call your pharmacist  or healthcare provider to get one. °Talk to your pediatrician regarding the use of this medicine in children. While this drug may be prescribed for children as young as 7 months for selected conditions, precautions do apply. °Overdosage: If you think you have taken too much of this medicine contact a poison control center or emergency room at once. °NOTE: This medicine is only for you. Do not share this medicine with others. °What if I miss a dose? °It is important not to miss your dose. Call your doctor or health care professional if you miss a dose. °What may interact with this medication? °This medicine may interact with the following medications: °medicines that may cause a release of neutrophils, such as lithium °This list may not describe all possible interactions. Give your health care provider a list of all the medicines, herbs, non-prescription drugs, or dietary supplements you use. Also tell them if you smoke, drink alcohol, or use illegal drugs. Some items may interact with your medicine. °What should I watch for while using this medication? °Your condition will be monitored carefully while you are receiving this medicine. °You may need blood work done while you are taking this medicine. °Talk to your health care provider about your risk of cancer. You may be more at risk for certain types of cancer if you take this medicine. °What side effects may I notice from receiving this medication? °Side effects that you should report to your doctor or health care professional as soon as possible: °allergic reactions like skin rash, itching or hives, swelling of the face, lips, or tongue °back pain °dizziness or feeling faint °fever °pain, redness, or   irritation at site where injected °pinpoint red spots on the skin °shortness of breath or breathing problems °signs and symptoms of kidney injury like trouble passing urine, change in the amount of urine, or red or dark-brown urine °stomach or side pain, or pain at  the shoulder °swelling °tiredness °unusual bleeding or bruising °Side effects that usually do not require medical attention (report to your doctor or health care professional if they continue or are bothersome): °bone pain °cough °diarrhea °hair loss °headache °muscle pain °This list may not describe all possible side effects. Call your doctor for medical advice about side effects. You may report side effects to FDA at 1-800-FDA-1088. °Where should I keep my medication? °Keep out of the reach of children. °Store in a refrigerator between 2 and 8 degrees C (36 and 46 degrees F). Do not freeze. Keep in carton to protect from light. Throw away this medicine if vials or syringes are left out of the refrigerator for more than 24 hours. Throw away any unused medicine after the expiration date. °NOTE: This sheet is a summary. It may not cover all possible information. If you have questions about this medicine, talk to your doctor, pharmacist, or health care provider. °© 2022 [employer] (2021-02-04 00:00:00) ° °

## 2021-07-01 ENCOUNTER — Inpatient Hospital Stay: Payer: PRIVATE HEALTH INSURANCE

## 2021-07-01 NOTE — Progress Notes (Signed)
[facility]  [address] [place],  [place]  [address] [phone]  Clinic Day:  07/08/2020  Referring physician: Greig Right, MD  This document serves as a record of services personally performed by Dequincy Macarthur Critchley, MD. It was created on their behalf by [facility] E, a trained medical scribe. The creation of this record is based on the scribe's personal observations and the provider's statements to them.  HISTORY OF PRESENT ILLNESS:  The patient is a 56 y.o. female with metastatic colon cancer, which includes spread of disease to her abdominal cavity, lungs, brain and right adrenal gland. She comes in today prior to her 24th cycle of FOLFIRI/Avastin.  The patient claims to have tolerated her 23rd cycle of treatment fairly well.  She has had both diarrhea and constipation over these past weeks, but the symptoms have not caused her any significant alteration in her daily quality of life.  She occasionally complains of fuzzy vision and mild balance instability.  Despite these symptoms, the patient claims her symptoms are nowhere as severe as what they were when she was initially diagnosed with metastatic CNS disease.  Of note, she is scheduled to undergo another brain MRI in early March 2023 in [place].  Overall, she is doing fine.  She denies having any new symptoms/findings which concern her for disease progression.     With respect to her colon cancer history, she is status post a right hemicolectomy in early October 2018, followed by 12 cycles of adjuvant chemotherapy, which were completed in April 2019.  In December 2021, she underwent a left cerebellar metastatectomy, whose pathology was consistent with metastatic colon cancer.  Tumor testing did come back MMR normal.  CT scans recently showed evidence of her cancer being in multiple locations, which has her on palliative chemotherapy at this time.    PHYSICAL EXAM:  Blood pressure 116/78, pulse 97,  temperature 97.8 F (36.6 C), resp. rate 14, height 5' 8"  (1.727 m), weight 181 lb 12.8 oz (82.5 kg), last menstrual period 02/22/2019, SpO2 95 %. Wt Readings from Last 3 Encounters:  07/07/21 181 lb 12.8 oz (82.5 kg)  07/03/21 179 lb 8 oz (81.4 kg)  07/02/21 181 lb (82.1 kg)   Body mass index is 27.64 kg/m. Performance status (ECOG): 1 Physical Exam Constitutional:      General: She is not in acute distress.    Appearance: Normal appearance. She is normal weight. She is not ill-appearing.  HENT:     Head: Normocephalic and atraumatic.  Eyes:     General: No scleral icterus.    Extraocular Movements: Extraocular movements intact.     Conjunctiva/sclera: Conjunctivae normal.     Pupils: Pupils are equal, round, and reactive to light.  Cardiovascular:     Rate and Rhythm: Regular rhythm. Tachycardia present.     Pulses: Normal pulses.     Heart sounds: Normal heart sounds. No murmur heard.   No friction rub. No gallop.  Pulmonary:     Effort: Pulmonary effort is normal. No respiratory distress.     Breath sounds: Normal breath sounds.  Abdominal:     General: Bowel sounds are normal. There is no distension.     Palpations: Abdomen is soft. There is no hepatomegaly, splenomegaly or mass.     Tenderness: There is no abdominal tenderness.  Musculoskeletal:        General: Normal range of motion.     Cervical back: Normal range of motion and neck  supple.     Right lower leg: No edema.     Left lower leg: No edema.  Lymphadenopathy:     Cervical: No cervical adenopathy.  Skin:    General: Skin is warm and dry.  Neurological:     General: No focal deficit present.     Mental Status: She is alert and oriented to person, place, and time. Mental status is at baseline.  Psychiatric:        Mood and Affect: Mood normal.        Behavior: Behavior normal.        Thought Content: Thought content normal.        Judgment: Judgment normal.   LABS:    Latest Reference Range & Units  07/07/21 00:00  Sodium 137 - 147  140 (E)  Potassium 3.4 - 5.3  3.5 (E)  Chloride 99 - 108  107 (E)  CO2 13 - 22  25 ! (E)  Glucose  180 (E)  BUN 4 - 21  10 (E)  Creatinine 0.5 - 1.1  1.2 ! (E)  Calcium 8.7 - 10.7  8.7 (E)  Alkaline Phosphatase 25 - 125  157 ! (E)  Albumin 3.5 - 5.0  4.1 (E)  AST 13 - 35  31 (E)  ALT 7 - 35  32 (E)  Bilirubin, Total  0.4 (E)  WBC  3.4 (E)  RBC 3.87 - 5.11  3.72 ! (E)  Hemoglobin 12.0 - 16.0  12.5 (E)  HCT 36 - 46  37 (E)  MCV 81 - 99  101 ! (E)  Platelets 150 - 399  116 ! (E)  NEUT#  1.50 (E)   ASSESSMENT & PLAN:  Assessment/Plan:  A 56 y.o. female with metastatic colon cancer.  She will proceed with her 24th cycle of FOLFIRI/Avastin this week.  Clinically, she appears to be doing fairly well.  I will see her back in 2 weeks before she heads into her 25th cycle of FOLFIRI/Avastin.  CT scans of her chest/abdomen/pelvis will be done a day before next visit chest pain new disease baseline after 24 cycles of palliative FOLFIRI/Avastin chemotherapy.  The patient understands all the plans discussed today and is in agreement with them.     I, Rita Ohara, am acting as scribe for Marice Potter, MD    I have reviewed this report as typed by the medical scribe, and it is complete and accurate.  Dequincy Macarthur Critchley, MD

## 2021-07-02 ENCOUNTER — Other Ambulatory Visit: Payer: Self-pay | Admitting: Psychiatry

## 2021-07-02 ENCOUNTER — Other Ambulatory Visit: Payer: Self-pay | Admitting: Pharmacist

## 2021-07-02 ENCOUNTER — Inpatient Hospital Stay: Payer: PRIVATE HEALTH INSURANCE | Attending: Oncology

## 2021-07-02 ENCOUNTER — Other Ambulatory Visit: Payer: Self-pay

## 2021-07-02 VITALS — BP 113/89 | HR 102 | Temp 97.4°F | Resp 20 | Ht 68.0 in | Wt 181.0 lb

## 2021-07-02 DIAGNOSIS — Z5112 Encounter for antineoplastic immunotherapy: Secondary | ICD-10-CM | POA: Insufficient documentation

## 2021-07-02 DIAGNOSIS — C7971 Secondary malignant neoplasm of right adrenal gland: Secondary | ICD-10-CM | POA: Diagnosis not present

## 2021-07-02 DIAGNOSIS — C7931 Secondary malignant neoplasm of brain: Secondary | ICD-10-CM | POA: Insufficient documentation

## 2021-07-02 DIAGNOSIS — C7989 Secondary malignant neoplasm of other specified sites: Secondary | ICD-10-CM | POA: Insufficient documentation

## 2021-07-02 DIAGNOSIS — C7802 Secondary malignant neoplasm of left lung: Secondary | ICD-10-CM | POA: Insufficient documentation

## 2021-07-02 DIAGNOSIS — F422 Mixed obsessional thoughts and acts: Secondary | ICD-10-CM

## 2021-07-02 DIAGNOSIS — C182 Malignant neoplasm of ascending colon: Secondary | ICD-10-CM

## 2021-07-02 DIAGNOSIS — Z5189 Encounter for other specified aftercare: Secondary | ICD-10-CM | POA: Insufficient documentation

## 2021-07-02 DIAGNOSIS — C7801 Secondary malignant neoplasm of right lung: Secondary | ICD-10-CM | POA: Diagnosis not present

## 2021-07-02 DIAGNOSIS — C189 Malignant neoplasm of colon, unspecified: Secondary | ICD-10-CM | POA: Diagnosis present

## 2021-07-02 DIAGNOSIS — Z5111 Encounter for antineoplastic chemotherapy: Secondary | ICD-10-CM | POA: Diagnosis not present

## 2021-07-02 DIAGNOSIS — F333 Major depressive disorder, recurrent, severe with psychotic symptoms: Secondary | ICD-10-CM

## 2021-07-02 MED ORDER — FILGRASTIM-SNDZ 480 MCG/0.8ML IJ SOSY
480.0000 ug | PREFILLED_SYRINGE | Freq: Once | INTRAMUSCULAR | Status: AC
  Administered 2021-07-02: 480 ug via SUBCUTANEOUS
  Filled 2021-07-02: qty 0.8

## 2021-07-02 NOTE — Progress Notes (Signed)
1531-pt discharged home stable

## 2021-07-02 NOTE — Patient Instructions (Signed)
Filgrastim, G-CSF injection °What is this medication? °FILGRASTIM, G-CSF (fil GRA stim) is a granulocyte colony-stimulating factor that stimulates the growth of neutrophils, a type of white blood cell (WBC) important in the body's fight against infection. It is used to reduce the incidence of fever and infection in patients with certain types of cancer who are receiving chemotherapy that affects the bone marrow, to stimulate blood cell production for removal of WBCs from the body prior to a bone marrow transplantation, to reduce the incidence of fever and infection in patients who have severe chronic neutropenia, and to improve survival outcomes following high-dose radiation exposure that is toxic to the bone marrow. °This medicine may be used for other purposes; ask your health care provider or pharmacist if you have questions. °COMMON BRAND NAME(S): Neupogen, Nivestym, Releuko, Zarxio °What should I tell my care team before I take this medication? °They need to know if you have any of these conditions: °kidney disease °latex allergy °ongoing radiation therapy °sickle cell disease °an unusual or allergic reaction to filgrastim, pegfilgrastim, other medicines, foods, dyes, or preservatives °pregnant or trying to get pregnant °breast-feeding °How should I use this medication? °This medicine is for injection under the skin or infusion into a vein. As an infusion into a vein, it is usually given by a health care professional in a hospital or clinic setting. If you get this medicine at home, you will be taught how to prepare and give this medicine. Refer to the Instructions for Use that come with your medication packaging. Use exactly as directed. Take your medicine at regular intervals. Do not take your medicine more often than directed. °It is important that you put your used needles and syringes in a special sharps container. Do not put them in a trash can. If you do not have a sharps container, call your pharmacist  or healthcare provider to get one. °Talk to your pediatrician regarding the use of this medicine in children. While this drug may be prescribed for children as young as 7 months for selected conditions, precautions do apply. °Overdosage: If you think you have taken too much of this medicine contact a poison control center or emergency room at once. °NOTE: This medicine is only for you. Do not share this medicine with others. °What if I miss a dose? °It is important not to miss your dose. Call your doctor or health care professional if you miss a dose. °What may interact with this medication? °This medicine may interact with the following medications: °medicines that may cause a release of neutrophils, such as lithium °This list may not describe all possible interactions. Give your health care provider a list of all the medicines, herbs, non-prescription drugs, or dietary supplements you use. Also tell them if you smoke, drink alcohol, or use illegal drugs. Some items may interact with your medicine. °What should I watch for while using this medication? °Your condition will be monitored carefully while you are receiving this medicine. °You may need blood work done while you are taking this medicine. °Talk to your health care provider about your risk of cancer. You may be more at risk for certain types of cancer if you take this medicine. °What side effects may I notice from receiving this medication? °Side effects that you should report to your doctor or health care professional as soon as possible: °allergic reactions like skin rash, itching or hives, swelling of the face, lips, or tongue °back pain °dizziness or feeling faint °fever °pain, redness, or   irritation at site where injected °pinpoint red spots on the skin °shortness of breath or breathing problems °signs and symptoms of kidney injury like trouble passing urine, change in the amount of urine, or red or dark-brown urine °stomach or side pain, or pain at  the shoulder °swelling °tiredness °unusual bleeding or bruising °Side effects that usually do not require medical attention (report to your doctor or health care professional if they continue or are bothersome): °bone pain °cough °diarrhea °hair loss °headache °muscle pain °This list may not describe all possible side effects. Call your doctor for medical advice about side effects. You may report side effects to FDA at 1-800-FDA-1088. °Where should I keep my medication? °Keep out of the reach of children. °Store in a refrigerator between 2 and 8 degrees C (36 and 46 degrees F). Do not freeze. Keep in carton to protect from light. Throw away this medicine if vials or syringes are left out of the refrigerator for more than 24 hours. Throw away any unused medicine after the expiration date. °NOTE: This sheet is a summary. It may not cover all possible information. If you have questions about this medicine, talk to your doctor, pharmacist, or health care provider. °© 2022 [employer] (2021-02-04 00:00:00) ° °

## 2021-07-03 ENCOUNTER — Inpatient Hospital Stay: Payer: PRIVATE HEALTH INSURANCE

## 2021-07-03 VITALS — BP 118/72 | HR 109 | Temp 97.5°F | Resp 18 | Ht 68.0 in | Wt 179.5 lb

## 2021-07-03 DIAGNOSIS — Z5111 Encounter for antineoplastic chemotherapy: Secondary | ICD-10-CM | POA: Diagnosis not present

## 2021-07-03 DIAGNOSIS — C182 Malignant neoplasm of ascending colon: Secondary | ICD-10-CM

## 2021-07-03 MED ORDER — FILGRASTIM-SNDZ 480 MCG/0.8ML IJ SOSY
480.0000 ug | PREFILLED_SYRINGE | Freq: Once | INTRAMUSCULAR | Status: AC
  Administered 2021-07-03: 480 ug via SUBCUTANEOUS
  Filled 2021-07-03: qty 0.8

## 2021-07-03 NOTE — Progress Notes (Signed)
1551: PT STABLE AT TIME OF DISCHARGE  

## 2021-07-03 NOTE — Patient Instructions (Signed)
Filgrastim, G-CSF injection °What is this medication? °FILGRASTIM, G-CSF (fil GRA stim) is a granulocyte colony-stimulating factor that stimulates the growth of neutrophils, a type of white blood cell (WBC) important in the body's fight against infection. It is used to reduce the incidence of fever and infection in patients with certain types of cancer who are receiving chemotherapy that affects the bone marrow, to stimulate blood cell production for removal of WBCs from the body prior to a bone marrow transplantation, to reduce the incidence of fever and infection in patients who have severe chronic neutropenia, and to improve survival outcomes following high-dose radiation exposure that is toxic to the bone marrow. °This medicine may be used for other purposes; ask your health care provider or pharmacist if you have questions. °COMMON BRAND NAME(S): Neupogen, Nivestym, Releuko, Zarxio °What should I tell my care team before I take this medication? °They need to know if you have any of these conditions: °kidney disease °latex allergy °ongoing radiation therapy °sickle cell disease °an unusual or allergic reaction to filgrastim, pegfilgrastim, other medicines, foods, dyes, or preservatives °pregnant or trying to get pregnant °breast-feeding °How should I use this medication? °This medicine is for injection under the skin or infusion into a vein. As an infusion into a vein, it is usually given by a health care professional in a hospital or clinic setting. If you get this medicine at home, you will be taught how to prepare and give this medicine. Refer to the Instructions for Use that come with your medication packaging. Use exactly as directed. Take your medicine at regular intervals. Do not take your medicine more often than directed. °It is important that you put your used needles and syringes in a special sharps container. Do not put them in a trash can. If you do not have a sharps container, call your pharmacist  or healthcare provider to get one. °Talk to your pediatrician regarding the use of this medicine in children. While this drug may be prescribed for children as young as 7 months for selected conditions, precautions do apply. °Overdosage: If you think you have taken too much of this medicine contact a poison control center or emergency room at once. °NOTE: This medicine is only for you. Do not share this medicine with others. °What if I miss a dose? °It is important not to miss your dose. Call your doctor or health care professional if you miss a dose. °What may interact with this medication? °This medicine may interact with the following medications: °medicines that may cause a release of neutrophils, such as lithium °This list may not describe all possible interactions. Give your health care provider a list of all the medicines, herbs, non-prescription drugs, or dietary supplements you use. Also tell them if you smoke, drink alcohol, or use illegal drugs. Some items may interact with your medicine. °What should I watch for while using this medication? °Your condition will be monitored carefully while you are receiving this medicine. °You may need blood work done while you are taking this medicine. °Talk to your health care provider about your risk of cancer. You may be more at risk for certain types of cancer if you take this medicine. °What side effects may I notice from receiving this medication? °Side effects that you should report to your doctor or health care professional as soon as possible: °allergic reactions like skin rash, itching or hives, swelling of the face, lips, or tongue °back pain °dizziness or feeling faint °fever °pain, redness, or   irritation at site where injected °pinpoint red spots on the skin °shortness of breath or breathing problems °signs and symptoms of kidney injury like trouble passing urine, change in the amount of urine, or red or dark-brown urine °stomach or side pain, or pain at  the shoulder °swelling °tiredness °unusual bleeding or bruising °Side effects that usually do not require medical attention (report to your doctor or health care professional if they continue or are bothersome): °bone pain °cough °diarrhea °hair loss °headache °muscle pain °This list may not describe all possible side effects. Call your doctor for medical advice about side effects. You may report side effects to FDA at 1-800-FDA-1088. °Where should I keep my medication? °Keep out of the reach of children. °Store in a refrigerator between 2 and 8 degrees C (36 and 46 degrees F). Do not freeze. Keep in carton to protect from light. Throw away this medicine if vials or syringes are left out of the refrigerator for more than 24 hours. Throw away any unused medicine after the expiration date. °NOTE: This sheet is a summary. It may not cover all possible information. If you have questions about this medicine, talk to your doctor, pharmacist, or health care provider. °© 2022 [employer] (2021-02-04 00:00:00) ° °

## 2021-07-03 NOTE — Progress Notes (Signed)
Enrolled patient into Co-Pay assistance for [place] through [employer] and [employer] through [employer].

## 2021-07-07 ENCOUNTER — Telehealth: Payer: Self-pay | Admitting: Oncology

## 2021-07-07 ENCOUNTER — Inpatient Hospital Stay: Payer: PRIVATE HEALTH INSURANCE

## 2021-07-07 ENCOUNTER — Other Ambulatory Visit: Payer: Self-pay | Admitting: Hematology and Oncology

## 2021-07-07 ENCOUNTER — Other Ambulatory Visit: Payer: Self-pay | Admitting: *Deleted

## 2021-07-07 ENCOUNTER — Other Ambulatory Visit: Payer: Self-pay

## 2021-07-07 ENCOUNTER — Other Ambulatory Visit: Payer: Self-pay | Admitting: Pharmacist

## 2021-07-07 ENCOUNTER — Other Ambulatory Visit: Payer: Self-pay | Admitting: Oncology

## 2021-07-07 ENCOUNTER — Inpatient Hospital Stay (INDEPENDENT_AMBULATORY_CARE_PROVIDER_SITE_OTHER): Payer: PRIVATE HEALTH INSURANCE | Admitting: Oncology

## 2021-07-07 DIAGNOSIS — C182 Malignant neoplasm of ascending colon: Secondary | ICD-10-CM

## 2021-07-07 DIAGNOSIS — Z5111 Encounter for antineoplastic chemotherapy: Secondary | ICD-10-CM | POA: Diagnosis not present

## 2021-07-07 DIAGNOSIS — C183 Malignant neoplasm of hepatic flexure: Secondary | ICD-10-CM | POA: Diagnosis not present

## 2021-07-07 DIAGNOSIS — C787 Secondary malignant neoplasm of liver and intrahepatic bile duct: Secondary | ICD-10-CM

## 2021-07-07 DIAGNOSIS — Z95828 Presence of other vascular implants and grafts: Secondary | ICD-10-CM

## 2021-07-07 DIAGNOSIS — C189 Malignant neoplasm of colon, unspecified: Secondary | ICD-10-CM

## 2021-07-07 LAB — CBC AND DIFFERENTIAL
HCT: 37 (ref 36–46)
Hemoglobin: 12.5 (ref 12.0–16.0)
Neutrophils Absolute: 1.5
Platelets: 116 — AB (ref 150–399)
WBC: 3.4

## 2021-07-07 LAB — COMPREHENSIVE METABOLIC PANEL
Albumin: 4.1 (ref 3.5–5.0)
Calcium: 8.7 (ref 8.7–10.7)

## 2021-07-07 LAB — HEPATIC FUNCTION PANEL
ALT: 32 (ref 7–35)
AST: 31 (ref 13–35)
Alkaline Phosphatase: 157 — AB (ref 25–125)
Bilirubin, Total: 0.4

## 2021-07-07 LAB — BASIC METABOLIC PANEL
BUN: 10 (ref 4–21)
CO2: 25 — AB (ref 13–22)
Chloride: 107 (ref 99–108)
Creatinine: 1.2 — AB (ref 0.5–1.1)
Glucose: 180
Potassium: 3.5 (ref 3.4–5.3)
Sodium: 140 (ref 137–147)

## 2021-07-07 LAB — CBC
MCV: 101 — AB (ref 81–99)
RBC: 3.72 — AB (ref 3.87–5.11)

## 2021-07-07 LAB — TOTAL PROTEIN, URINE DIPSTICK: Protein, ur: NEGATIVE mg/dL

## 2021-07-07 MED FILL — Fluorouracil IV Soln 2.5 GM/50ML (50 MG/ML): INTRAVENOUS | Qty: 17 | Status: AC

## 2021-07-07 MED FILL — Leucovorin Calcium For Inj 350 MG: INTRAMUSCULAR | Qty: 41.8 | Status: AC

## 2021-07-07 MED FILL — Irinotecan HCl Inj 100 MG/5ML (20 MG/ML): INTRAVENOUS | Qty: 14 | Status: AC

## 2021-07-07 MED FILL — Fluorouracil IV Soln 5 GM/100ML (50 MG/ML): INTRAVENOUS | Qty: 100 | Status: AC

## 2021-07-07 NOTE — Telephone Encounter (Signed)
Per 07/07/21 los next appt sccheduled and confirmed with patient

## 2021-07-08 ENCOUNTER — Inpatient Hospital Stay: Payer: PRIVATE HEALTH INSURANCE

## 2021-07-08 VITALS — BP 111/82 | HR 98 | Temp 97.6°F | Resp 20 | Ht 68.0 in | Wt 184.8 lb

## 2021-07-08 DIAGNOSIS — C182 Malignant neoplasm of ascending colon: Secondary | ICD-10-CM

## 2021-07-08 DIAGNOSIS — Z5111 Encounter for antineoplastic chemotherapy: Secondary | ICD-10-CM | POA: Diagnosis not present

## 2021-07-08 MED ORDER — PALONOSETRON HCL INJECTION 0.25 MG/5ML
0.2500 mg | Freq: Once | INTRAVENOUS | Status: AC
  Administered 2021-07-08: 0.25 mg via INTRAVENOUS
  Filled 2021-07-08: qty 5

## 2021-07-08 MED ORDER — FLUOROURACIL CHEMO INJECTION 2.5 GM/50ML
400.0000 mg/m2 | Freq: Once | INTRAVENOUS | Status: AC
  Administered 2021-07-08: 850 mg via INTRAVENOUS
  Filled 2021-07-08: qty 17

## 2021-07-08 MED ORDER — SODIUM CHLORIDE 0.9 % IV SOLN: Freq: Once | INTRAVENOUS | Status: AC

## 2021-07-08 MED ORDER — SODIUM CHLORIDE 0.9 % IV SOLN: Freq: Once | INTRAVENOUS | Status: DC

## 2021-07-08 MED ORDER — SODIUM CHLORIDE 0.9 % IV SOLN
10.0000 mg | Freq: Once | INTRAVENOUS | Status: AC
  Administered 2021-07-08: 10 mg via INTRAVENOUS
  Filled 2021-07-08: qty 1

## 2021-07-08 MED ORDER — SODIUM CHLORIDE 0.9 % IV SOLN
5.0000 mg/kg | Freq: Once | INTRAVENOUS | Status: AC
  Administered 2021-07-08: 400 mg via INTRAVENOUS
  Filled 2021-07-08: qty 16

## 2021-07-08 MED ORDER — LEUCOVORIN CALCIUM INJECTION 350 MG
400.0000 mg/m2 | Freq: Once | INTRAVENOUS | Status: AC
  Administered 2021-07-08: 836 mg via INTRAVENOUS
  Filled 2021-07-08: qty 41.8

## 2021-07-08 MED ORDER — SODIUM CHLORIDE 0.9 % IV SOLN
2400.0000 mg/m2 | INTRAVENOUS | Status: DC
  Administered 2021-07-08: 5000 mg via INTRAVENOUS
  Filled 2021-07-08: qty 100

## 2021-07-08 MED ORDER — IRINOTECAN HCL CHEMO INJECTION 100 MG/5ML
135.0000 mg/m2 | Freq: Once | INTRAVENOUS | Status: AC
  Administered 2021-07-08: 280 mg via INTRAVENOUS
  Filled 2021-07-08: qty 10

## 2021-07-08 MED ORDER — SODIUM CHLORIDE 0.9 % IV SOLN
150.0000 mg | Freq: Once | INTRAVENOUS | Status: AC
  Administered 2021-07-08: 150 mg via INTRAVENOUS
  Filled 2021-07-08: qty 5

## 2021-07-08 MED ORDER — ATROPINE SULFATE 1 MG/ML IV SOLN
0.5000 mg | Freq: Once | INTRAVENOUS | Status: AC | PRN
  Administered 2021-07-08: 0.5 mg via INTRAVENOUS
  Filled 2021-07-08: qty 1

## 2021-07-08 NOTE — Patient Instructions (Signed)
Fluorouracil, 5-FU injection What is this medication? FLUOROURACIL, 5-FU (flure oh YOOR a sil) is a chemotherapy drug. It slows the growth of cancer cells. This medicine is used to treat many types of cancer like breast cancer, colon or rectal cancer, pancreatic cancer, and stomach cancer. This medicine may be used for other purposes; ask your health care provider or pharmacist if you have questions. COMMON BRAND NAME(S): Adrucil What should I tell my care team before I take this medication? They need to know if you have any of these conditions: blood disorders dihydropyrimidine dehydrogenase (DPD) deficiency infection (especially a virus infection such as chickenpox, cold sores, or herpes) kidney disease liver disease malnourished, poor nutrition recent or ongoing radiation therapy an unusual or allergic reaction to fluorouracil, other chemotherapy, other medicines, foods, dyes, or preservatives pregnant or trying to get pregnant breast-feeding How should I use this medication? This drug is given as an infusion or injection into a vein. It is administered in a hospital or clinic by a specially trained health care professional. Talk to your pediatrician regarding the use of this medicine in children. Special care may be needed. Overdosage: If you think you have taken too much of this medicine contact a poison control center or emergency room at once. NOTE: This medicine is only for you. Do not share this medicine with others. What if I miss a dose? It is important not to miss your dose. Call your doctor or health care professional if you are unable to keep an appointment. What may interact with this medication? Do not take this medicine with any of the following medications: live virus vaccines This medicine may also interact with the following medications: medicines that treat or prevent blood clots like warfarin, enoxaparin, and dalteparin This list may not describe all possible  interactions. Give your health care provider a list of all the medicines, herbs, non-prescription drugs, or dietary supplements you use. Also tell them if you smoke, drink alcohol, or use illegal drugs. Some items may interact with your medicine. What should I watch for while using this medication? Visit your doctor for checks on your progress. This drug may make you feel generally unwell. This is not uncommon, as chemotherapy can affect healthy cells as well as cancer cells. Report any side effects. Continue your course of treatment even though you feel ill unless your doctor tells you to stop. In some cases, you may be given additional medicines to help with side effects. Follow all directions for their use. Call your doctor or health care professional for advice if you get a fever, chills or sore throat, or other symptoms of a cold or flu. Do not treat yourself. This drug decreases your body's ability to fight infections. Try to avoid being around people who are sick. This medicine may increase your risk to bruise or bleed. Call your doctor or health care professional if you notice any unusual bleeding. Be careful brushing and flossing your teeth or using a toothpick because you may get an infection or bleed more easily. If you have any dental work done, tell your dentist you are receiving this medicine. Avoid taking products that contain aspirin, acetaminophen, ibuprofen, naproxen, or ketoprofen unless instructed by your doctor. These medicines may hide a fever. Do not become pregnant while taking this medicine. Women should inform their doctor if they wish to become pregnant or think they might be pregnant. There is a potential for serious side effects to an unborn child. Talk to your health care  professional or pharmacist for more information. Do not breast-feed an infant while taking this medicine. Men should inform their doctor if they wish to father a child. This medicine may lower sperm  counts. Do not treat diarrhea with over the counter products. Contact your doctor if you have diarrhea that lasts more than 2 days or if it is severe and watery. This medicine can make you more sensitive to the sun. Keep out of the sun. If you cannot avoid being in the sun, wear protective clothing and use sunscreen. Do not use sun lamps or tanning beds/booths. What side effects may I notice from receiving this medication? Side effects that you should report to your doctor or health care professional as soon as possible: allergic reactions like skin rash, itching or hives, swelling of the face, lips, or tongue low blood counts - this medicine may decrease the number of white blood cells, red blood cells and platelets. You may be at increased risk for infections and bleeding. signs of infection - fever or chills, cough, sore throat, pain or difficulty passing urine signs of decreased platelets or bleeding - bruising, pinpoint red spots on the skin, black, tarry stools, blood in the urine signs of decreased red blood cells - unusually weak or tired, fainting spells, lightheadedness breathing problems changes in vision chest pain mouth sores nausea and vomiting pain, swelling, redness at site where injected pain, tingling, numbness in the hands or feet redness, swelling, or sores on hands or feet stomach pain unusual bleeding Side effects that usually do not require medical attention (report to your doctor or health care professional if they continue or are bothersome): changes in finger or toe nails diarrhea dry or itchy skin hair loss headache loss of appetite sensitivity of eyes to the light stomach upset unusually teary eyes This list may not describe all possible side effects. Call your doctor for medical advice about side effects. You may report side effects to FDA at 1-800-FDA-1088. Where should I keep my medication? This drug is given in a hospital or clinic and will not be  stored at home. NOTE: This sheet is a summary. It may not cover all possible information. If you have questions about this medicine, talk to your doctor, pharmacist, or health care provider.  2022 Elsevier/Gold Standard (2021-02-04 00:00:00) Leucovorin injection What is this medication? LEUCOVORIN (loo koe VOR in) is used to prevent or treat the harmful effects of some medicines. This medicine is used to treat anemia caused by a low amount of folic acid in the body. It is also used with 5-fluorouracil (5-FU) to treat colon cancer. This medicine may be used for other purposes; ask your health care provider or pharmacist if you have questions. What should I tell my care team before I take this medication? They need to know if you have any of these conditions: anemia from low levels of vitamin B-12 in the blood an unusual or allergic reaction to leucovorin, folic acid, other medicines, foods, dyes, or preservatives pregnant or trying to get pregnant breast-feeding How should I use this medication? This medicine is for injection into a muscle or into a vein. It is given by a health care professional in a hospital or clinic setting. Talk to your pediatrician regarding the use of this medicine in children. Special care may be needed. Overdosage: If you think you have taken too much of this medicine contact a poison control center or emergency room at once. NOTE: This medicine is only for you.  Do not share this medicine with others. What if I miss a dose? This does not apply. What may interact with this medication? capecitabine fluorouracil phenobarbital phenytoin primidone trimethoprim-sulfamethoxazole This list may not describe all possible interactions. Give your health care provider a list of all the medicines, herbs, non-prescription drugs, or dietary supplements you use. Also tell them if you smoke, drink alcohol, or use illegal drugs. Some items may interact with your medicine. What  should I watch for while using this medication? Your condition will be monitored carefully while you are receiving this medicine. This medicine may increase the side effects of 5-fluorouracil, 5-FU. Tell your doctor or health care professional if you have diarrhea or mouth sores that do not get better or that get worse. What side effects may I notice from receiving this medication? Side effects that you should report to your doctor or health care professional as soon as possible: allergic reactions like skin rash, itching or hives, swelling of the face, lips, or tongue breathing problems fever, infection mouth sores unusual bleeding or bruising unusually weak or tired Side effects that usually do not require medical attention (report to your doctor or health care professional if they continue or are bothersome): constipation or diarrhea loss of appetite nausea, vomiting This list may not describe all possible side effects. Call your doctor for medical advice about side effects. You may report side effects to FDA at 1-800-FDA-1088. Where should I keep my medication? This drug is given in a hospital or clinic and will not be stored at home. NOTE: This sheet is a summary. It may not cover all possible information. If you have questions about this medicine, talk to your doctor, pharmacist, or health care provider.  2022 Elsevier/Gold Standard (2007-11-24 00:00:00) Irinotecan injection What is this medication? IRINOTECAN (ir in oh TEE kan ) is a chemotherapy drug. It is used to treat colon and rectal cancer. This medicine may be used for other purposes; ask your health care provider or pharmacist if you have questions. COMMON BRAND NAME(S): Camptosar What should I tell my care team before I take this medication? They need to know if you have any of these conditions: dehydration diarrhea infection (especially a virus infection such as chickenpox, cold sores, or herpes) liver disease low  blood counts, like low white cell, platelet, or red cell counts low levels of calcium, magnesium, or potassium in the blood recent or ongoing radiation therapy an unusual or allergic reaction to irinotecan, other medicines, foods, dyes, or preservatives pregnant or trying to get pregnant breast-feeding How should I use this medication? This drug is given as an infusion into a vein. It is administered in a hospital or clinic by a specially trained health care professional. Talk to your pediatrician regarding the use of this medicine in children. Special care may be needed. Overdosage: If you think you have taken too much of this medicine contact a poison control center or emergency room at once. NOTE: This medicine is only for you. Do not share this medicine with others. What if I miss a dose? It is important not to miss your dose. Call your doctor or health care professional if you are unable to keep an appointment. What may interact with this medication? Do not take this medicine with any of the following medications: cobicistat itraconazole This medicine may interact with the following medications: antiviral medicines for HIV or AIDS certain antibiotics like rifampin or rifabutin certain medicines for fungal infections like ketoconazole, posaconazole, and voriconazole certain  medicines for seizures like carbamazepine, phenobarbital, phenotoin clarithromycin gemfibrozil nefazodone [place]. John's Wort This list may not describe all possible interactions. Give your health care provider a list of all the medicines, herbs, non-prescription drugs, or dietary supplements you use. Also tell them if you smoke, drink alcohol, or use illegal drugs. Some items may interact with your medicine. What should I watch for while using this medication? Your condition will be monitored carefully while you are receiving this medicine. You will need important blood work done while you are taking this  medicine. This drug may make you feel generally unwell. This is not uncommon, as chemotherapy can affect healthy cells as well as cancer cells. Report any side effects. Continue your course of treatment even though you feel ill unless your doctor tells you to stop. In some cases, you may be given additional medicines to help with side effects. Follow all directions for their use. You may get drowsy or dizzy. Do not drive, use machinery, or do anything that needs mental alertness until you know how this medicine affects you. Do not stand or sit up quickly, especially if you are an older patient. This reduces the risk of dizzy or fainting spells. Call your health care professional for advice if you get a fever, chills, or sore throat, or other symptoms of a cold or flu. Do not treat yourself. This medicine decreases your body's ability to fight infections. Try to avoid being around people who are sick. Avoid taking products that contain aspirin, acetaminophen, ibuprofen, naproxen, or ketoprofen unless instructed by your doctor. These medicines may hide a fever. This medicine may increase your risk to bruise or bleed. Call your doctor or health care professional if you notice any unusual bleeding. Be careful brushing and flossing your teeth or using a toothpick because you may get an infection or bleed more easily. If you have any dental work done, tell your dentist you are receiving this medicine. Do not become pregnant while taking this medicine or for 6 months after stopping it. Women should inform their health care professional if they wish to become pregnant or think they might be pregnant. Men should not father a child while taking this medicine and for 3 months after stopping it. There is potential for serious side effects to an unborn child. Talk to your health care professional for more information. Do not breast-feed an infant while taking this medicine or for 7 days after stopping it. This medicine  has caused ovarian failure in some women. This medicine may make it more difficult to get pregnant. Talk to your health care professional if you are concerned about your fertility. This medicine has caused decreased sperm counts in some men. This may make it more difficult to father a child. Talk to your health care professional if you are concerned about your fertility. What side effects may I notice from receiving this medication? Side effects that you should report to your doctor or health care professional as soon as possible: allergic reactions like skin rash, itching or hives, swelling of the face, lips, or tongue chest pain diarrhea flushing, runny nose, sweating during infusion low blood counts - this medicine may decrease the number of white blood cells, red blood cells and platelets. You may be at increased risk for infections and bleeding. nausea, vomiting pain, swelling, warmth in the leg signs of decreased platelets or bleeding - bruising, pinpoint red spots on the skin, black, tarry stools, blood in the urine signs of infection -  fever or chills, cough, sore throat, pain or difficulty passing urine signs of decreased red blood cells - unusually weak or tired, fainting spells, lightheadedness Side effects that usually do not require medical attention (report to your doctor or health care professional if they continue or are bothersome): constipation hair loss headache loss of appetite mouth sores stomach pain This list may not describe all possible side effects. Call your doctor for medical advice about side effects. You may report side effects to FDA at 1-800-FDA-1088. Where should I keep my medication? This drug is given in a hospital or clinic and will not be stored at home. NOTE: This sheet is a summary. It may not cover all possible information. If you have questions about this medicine, talk to your doctor, pharmacist, or health care provider.  2022 Elsevier/Gold Standard  (2021-02-04 00:00:00)

## 2021-07-10 ENCOUNTER — Inpatient Hospital Stay: Payer: PRIVATE HEALTH INSURANCE

## 2021-07-10 ENCOUNTER — Other Ambulatory Visit: Payer: Self-pay

## 2021-07-10 VITALS — BP 109/69 | HR 99 | Temp 98.1°F | Resp 18 | Ht 68.0 in | Wt 187.0 lb

## 2021-07-10 DIAGNOSIS — Z5111 Encounter for antineoplastic chemotherapy: Secondary | ICD-10-CM | POA: Diagnosis not present

## 2021-07-10 DIAGNOSIS — C182 Malignant neoplasm of ascending colon: Secondary | ICD-10-CM

## 2021-07-10 MED ORDER — SODIUM CHLORIDE 0.9% FLUSH
10.0000 mL | INTRAVENOUS | Status: DC | PRN
  Administered 2021-07-10: 10 mL

## 2021-07-10 MED ORDER — SODIUM CHLORIDE 0.9 % IV SOLN: Freq: Once | INTRAVENOUS | Status: AC

## 2021-07-10 MED ORDER — HEPARIN SOD (PORK) LOCK FLUSH 100 UNIT/ML IV SOLN
500.0000 [IU] | Freq: Once | INTRAVENOUS | Status: AC | PRN
  Administered 2021-07-10: 500 [IU]

## 2021-07-10 NOTE — Patient Instructions (Signed)
Fluorouracil, 5-FU injection What is this medication? FLUOROURACIL, 5-FU (flure oh YOOR a sil) is a chemotherapy drug. It slows the growth of cancer cells. This medicine is used to treat many types of cancer like breast cancer, colon or rectal cancer, pancreatic cancer, and stomach cancer. This medicine may be used for other purposes; ask your health care provider or pharmacist if you have questions. COMMON BRAND NAME(S): Adrucil What should I tell my care team before I take this medication? They need to know if you have any of these conditions: blood disorders dihydropyrimidine dehydrogenase (DPD) deficiency infection (especially a virus infection such as chickenpox, cold sores, or herpes) kidney disease liver disease malnourished, poor nutrition recent or ongoing radiation therapy an unusual or allergic reaction to fluorouracil, other chemotherapy, other medicines, foods, dyes, or preservatives pregnant or trying to get pregnant breast-feeding How should I use this medication? This drug is given as an infusion or injection into a vein. It is administered in a hospital or clinic by a specially trained health care professional. Talk to your pediatrician regarding the use of this medicine in children. Special care may be needed. Overdosage: If you think you have taken too much of this medicine contact a poison control center or emergency room at once. NOTE: This medicine is only for you. Do not share this medicine with others. What if I miss a dose? It is important not to miss your dose. Call your doctor or health care professional if you are unable to keep an appointment. What may interact with this medication? Do not take this medicine with any of the following medications: live virus vaccines This medicine may also interact with the following medications: medicines that treat or prevent blood clots like warfarin, enoxaparin, and dalteparin This list may not describe all possible  interactions. Give your health care provider a list of all the medicines, herbs, non-prescription drugs, or dietary supplements you use. Also tell them if you smoke, drink alcohol, or use illegal drugs. Some items may interact with your medicine. What should I watch for while using this medication? Visit your doctor for checks on your progress. This drug may make you feel generally unwell. This is not uncommon, as chemotherapy can affect healthy cells as well as cancer cells. Report any side effects. Continue your course of treatment even though you feel ill unless your doctor tells you to stop. In some cases, you may be given additional medicines to help with side effects. Follow all directions for their use. Call your doctor or health care professional for advice if you get a fever, chills or sore throat, or other symptoms of a cold or flu. Do not treat yourself. This drug decreases your body's ability to fight infections. Try to avoid being around people who are sick. This medicine may increase your risk to bruise or bleed. Call your doctor or health care professional if you notice any unusual bleeding. Be careful brushing and flossing your teeth or using a toothpick because you may get an infection or bleed more easily. If you have any dental work done, tell your dentist you are receiving this medicine. Avoid taking products that contain aspirin, acetaminophen, ibuprofen, naproxen, or ketoprofen unless instructed by your doctor. These medicines may hide a fever. Do not become pregnant while taking this medicine. Women should inform their doctor if they wish to become pregnant or think they might be pregnant. There is a potential for serious side effects to an unborn child. Talk to your health care   professional or pharmacist for more information. Do not breast-feed an infant while taking this medicine. Men should inform their doctor if they wish to father a child. This medicine may lower sperm  counts. Do not treat diarrhea with over the counter products. Contact your doctor if you have diarrhea that lasts more than 2 days or if it is severe and watery. This medicine can make you more sensitive to the sun. Keep out of the sun. If you cannot avoid being in the sun, wear protective clothing and use sunscreen. Do not use sun lamps or tanning beds/booths. What side effects may I notice from receiving this medication? Side effects that you should report to your doctor or health care professional as soon as possible: allergic reactions like skin rash, itching or hives, swelling of the face, lips, or tongue low blood counts - this medicine may decrease the number of white blood cells, red blood cells and platelets. You may be at increased risk for infections and bleeding. signs of infection - fever or chills, cough, sore throat, pain or difficulty passing urine signs of decreased platelets or bleeding - bruising, pinpoint red spots on the skin, black, tarry stools, blood in the urine signs of decreased red blood cells - unusually weak or tired, fainting spells, lightheadedness breathing problems changes in vision chest pain mouth sores nausea and vomiting pain, swelling, redness at site where injected pain, tingling, numbness in the hands or feet redness, swelling, or sores on hands or feet stomach pain unusual bleeding Side effects that usually do not require medical attention (report to your doctor or health care professional if they continue or are bothersome): changes in finger or toe nails diarrhea dry or itchy skin hair loss headache loss of appetite sensitivity of eyes to the light stomach upset unusually teary eyes This list may not describe all possible side effects. Call your doctor for medical advice about side effects. You may report side effects to FDA at 1-800-FDA-1088. Where should I keep my medication? This drug is given in a hospital or clinic and will not be  stored at home. NOTE: This sheet is a summary. It may not cover all possible information. If you have questions about this medicine, talk to your doctor, pharmacist, or health care provider.  2022 [employer] (2021-02-04 00:00:00)  

## 2021-07-11 ENCOUNTER — Telehealth: Payer: Self-pay | Admitting: Oncology

## 2021-07-11 ENCOUNTER — Telehealth: Payer: Self-pay

## 2021-07-11 ENCOUNTER — Encounter: Payer: Self-pay | Admitting: Oncology

## 2021-07-11 NOTE — Telephone Encounter (Signed)
After receiving a denial on her prior authorization for pt's Vraylar 3 mg capsule, I submitted an appeal with a overturned decision and now her PA is APPROVED effective 07/08/2021-07/07/2022 with RX Benefits.   Member ID# [id number]

## 2021-07-11 NOTE — Telephone Encounter (Signed)
Thank you :)

## 2021-07-11 NOTE — Telephone Encounter (Signed)
Patient has been scheduled for CT C/A/P. LVM notifying pt of appt date and time.  Notified pt she needs to be NPO 2 hrs prior to exam and she would need to pick up contrast no later than on the 16th. Pending a call back to confirm appt.   CT CHEST/ABD/PEL Scheduled for 07/18/21 @ 10 am ; Check in at 9 (due to needing labs)

## 2021-07-14 NOTE — Telephone Encounter (Signed)
I called pharmacy and they recognize the prior authorization for Vraylar.

## 2021-07-15 ENCOUNTER — Inpatient Hospital Stay: Payer: PRIVATE HEALTH INSURANCE

## 2021-07-15 ENCOUNTER — Other Ambulatory Visit: Payer: Self-pay

## 2021-07-15 VITALS — BP 107/85 | HR 115 | Temp 98.2°F | Resp 18 | Ht 68.0 in | Wt 177.8 lb

## 2021-07-15 DIAGNOSIS — C182 Malignant neoplasm of ascending colon: Secondary | ICD-10-CM

## 2021-07-15 DIAGNOSIS — Z5111 Encounter for antineoplastic chemotherapy: Secondary | ICD-10-CM | POA: Diagnosis not present

## 2021-07-15 MED ORDER — FILGRASTIM-SNDZ 480 MCG/0.8ML IJ SOSY
480.0000 ug | PREFILLED_SYRINGE | Freq: Once | INTRAMUSCULAR | Status: AC
  Administered 2021-07-15: 480 ug via SUBCUTANEOUS
  Filled 2021-07-15: qty 0.8

## 2021-07-15 NOTE — Progress Notes (Signed)
1542:PT STABLE AT TIME OF DISCHARGE

## 2021-07-15 NOTE — Progress Notes (Signed)
[facility]  [address] [place],  [place]  [address] [phone]  Clinic Day:  07/21/2021  Referring physician: Greig Right, MD  This document serves as a record of services personally performed by Bijou Easler Macarthur Critchley, MD. It was created on their behalf by [facility] E, a trained medical scribe. The creation of this record is based on the scribe's personal observations and the provider's statements to them.  HISTORY OF PRESENT ILLNESS:  The patient is a 56 y.o. female with metastatic colon cancer, which includes spread of disease to her abdominal cavity, lungs, brain and right adrenal gland. She comes in today to ascertain her new disease baseline after 24 cycles of palliative FOLFIRI/Avastin chemotherapy.  The patient claims to have tolerated her 24th cycle of treatment fairly well.  She has had both diarrhea and constipation over these past weeks, but the symptoms have not caused her any significant alteration in her daily quality of life.  She occasionally complains of fuzzy vision and mild balance instability.  Despite these symptoms, the patient claims her symptoms are nowhere as severe as what they were when she was initially diagnosed with metastatic CNS disease.  However, she believes it is taking longer for her to bounce back to her baseline after each successive cycle of palliative chemotherapy.      With respect to her colon cancer history, she is status post a right hemicolectomy in early October 2018, followed by 12 cycles of adjuvant FOLFOX chemotherapy, which were completed in April 2019.  In December 2021, she underwent a left cerebellar metastatectomy, whose pathology was consistent with metastatic colon cancer.  Tumor testing did come back MMR normal.  CT scans recently showed evidence of her cancer being in multiple locations, which has her on palliative chemotherapy at this time.    PHYSICAL EXAM:  Blood pressure 117/75, pulse (!) 101,  temperature 98 F (36.7 C), resp. rate 14, height 5' 8"  (1.727 m), weight 179 lb 8 oz (81.4 kg), last menstrual period 02/22/2019, SpO2 98 %. Wt Readings from Last 3 Encounters:  07/21/21 179 lb 8 oz (81.4 kg)  07/16/21 177 lb (80.3 kg)  07/15/21 177 lb 12 oz (80.6 kg)   Body mass index is 27.29 kg/m. Performance status (ECOG): 1 Physical Exam Constitutional:      General: She is not in acute distress.    Appearance: Normal appearance. She is normal weight. She is not ill-appearing.  HENT:     Head: Normocephalic and atraumatic.  Eyes:     General: No scleral icterus.    Extraocular Movements: Extraocular movements intact.     Conjunctiva/sclera: Conjunctivae normal.     Pupils: Pupils are equal, round, and reactive to light.  Cardiovascular:     Rate and Rhythm: Regular rhythm. Tachycardia present.     Pulses: Normal pulses.     Heart sounds: Normal heart sounds. No murmur heard.   No friction rub. No gallop.  Pulmonary:     Effort: Pulmonary effort is normal. No respiratory distress.     Breath sounds: Normal breath sounds.  Abdominal:     General: Bowel sounds are normal. There is no distension.     Palpations: Abdomen is soft. There is no hepatomegaly, splenomegaly or mass.     Tenderness: There is no abdominal tenderness.  Musculoskeletal:        General: Normal range of motion.     Cervical back: Normal range of motion and neck supple.  Right lower leg: No edema.     Left lower leg: No edema.  Lymphadenopathy:     Cervical: No cervical adenopathy.  Skin:    General: Skin is warm and dry.  Neurological:     General: No focal deficit present.     Mental Status: She is alert and oriented to person, place, and time. Mental status is at baseline.  Psychiatric:        Mood and Affect: Mood normal.        Behavior: Behavior normal.        Thought Content: Thought content normal.        Judgment: Judgment normal.   SCANS:  CT scans of her chest/abdomen/pelvis  revealed the following: FINDINGS:  CT CHEST FINDINGS  Cardiovascular: Right anterior chest wall Port-A-Cath is present  with tip terminating in the superior vena cava. Normal heart size.  Trace fluid superior pericardial recess.  Mediastinum/Nodes: No enlarged axillary, mediastinal or hilar  lymphadenopathy. Normal appearance of the esophagus.  Lungs/Pleura: Central airways are patent. Mild interval increase in  size of spiculated subpleural right lower lobe nodule measuring 13 x  9 mm (image 74; series 03/01), previously 11 x 8 mm. Similar  spiculated nodule within the posterior segment right upper lobe  measuring 20 x 13 mm, previously 20 x 13 mm (image 46; series 301).  No new pulmonary nodules are identified. No pleural effusion or  pneumothorax.  Musculoskeletal: Thoracic spine degenerative changes.  Hepatobiliary: The liver is normal in size and contour. Hepatic  steatosis. No focal hepatic lesion is identified. Gallbladder is  unremarkable. No intrahepatic or extrahepatic biliary ductal  dilatation.  Pancreas: Unremarkable  Spleen: Unremarkable  Adrenals/Urinary Tract: Normal adrenal glands. Kidneys enhance  symmetrically with contrast. No hydronephrosis. Urinary bladder is  unremarkable.  Stomach/Bowel: Oral contrast material to the level of the rectum.  Prior right hemicolectomy. No abnormal bowel wall thickening or  evidence for bowel obstruction. Normal morphology of the stomach.  Vascular/Lymphatic: Normal caliber abdominal aorta. No  retroperitoneal lymphadenopathy. Aortic atherosclerosis.  Reproductive: Status post hysterectomy. No adnexal masses are  identified.  Other: Diastasis of the rectus abdominus musculature. Grossly  unchanged small bowel mesenteric nodule measuring 10 x 9 mm (image  83; series 2). Grossly unchanged small bowel mesenteric nodule  measuring 13 x 12 mm (image 76; series 2), previously 13 x 13 mm. No  definite new mesenteric peritoneal  nodularity identified.  Musculoskeletal: Lumbar spine degenerative changes. No aggressive or  acute appearing osseous lesions.   IMPRESSION:  1. Stable to minimal interval increase in size of spiculated  subpleural right lower lobe nodule.  2. Additional pulmonary nodule within the right upper lobe as well  as small-bowel mesenteric nodules are grossly unchanged when  compared to prior exam.  3. No new findings.  4. Aortic atherosclerosis.   LABS:    Latest Reference Range & Units 07/18/21 00:00  Sodium 137 - 147  138  Potassium 3.4 - 5.3  3.5  Chloride 99 - 108  105  CO2 13 - 22  20  Glucose  208  BUN 4 - 21  13  Creatinine 0.5 - 1.1  1.4 !  Calcium 8.7 - 10.7  9.0  Alkaline Phosphatase 25 - 125  195 !  Albumin 3.5 - 5.0  4.1  AST 13 - 35  25  ALT 7 - 35  26  Bilirubin, Total  0.5  WBC  7.2  RBC 3.87 - 5.11  3.75 !  Hemoglobin 12.0 - 16.0  12.6  HCT 36 - 46  38  Platelets 150 - 399  116 !  NEUT#  4.82      ASSESSMENT & PLAN:  Assessment/Plan:  A 56 y.o. female with metastatic colon cancer.  In clinic, I reviewed the CT images with her, for which she could see there are no obvious signs of disease progression.  Clinically, she appears to be doing fairly well.Marland Kitchen She will proceed with her 25th cycle of FOLFIRI/Avastin this week.  However, I will begin spacing all future cycles of chemotherapy out to every 3 weeks to give her an additional week to physically recuperate before heading into each new cycle of therapy.  I will see her back in 3 weeks before she heads into her 26th cycle of FOLFIRI/Avastin. The patient understands all the plans discussed today and is in agreement with them.     I, Rita Ohara, am acting as scribe for Marice Potter, MD    I have reviewed this report as typed by the medical scribe, and it is complete and accurate.  Sumiko Ceasar Macarthur Critchley, MD

## 2021-07-15 NOTE — Patient Instructions (Signed)
Filgrastim, G-CSF injection °What is this medication? °FILGRASTIM, G-CSF (fil GRA stim) is a granulocyte colony-stimulating factor that stimulates the growth of neutrophils, a type of white blood cell (WBC) important in the body's fight against infection. It is used to reduce the incidence of fever and infection in patients with certain types of cancer who are receiving chemotherapy that affects the bone marrow, to stimulate blood cell production for removal of WBCs from the body prior to a bone marrow transplantation, to reduce the incidence of fever and infection in patients who have severe chronic neutropenia, and to improve survival outcomes following high-dose radiation exposure that is toxic to the bone marrow. °This medicine may be used for other purposes; ask your health care provider or pharmacist if you have questions. °COMMON BRAND NAME(S): Neupogen, Nivestym, Releuko, Zarxio °What should I tell my care team before I take this medication? °They need to know if you have any of these conditions: °kidney disease °latex allergy °ongoing radiation therapy °sickle cell disease °an unusual or allergic reaction to filgrastim, pegfilgrastim, other medicines, foods, dyes, or preservatives °pregnant or trying to get pregnant °breast-feeding °How should I use this medication? °This medicine is for injection under the skin or infusion into a vein. As an infusion into a vein, it is usually given by a health care professional in a hospital or clinic setting. If you get this medicine at home, you will be taught how to prepare and give this medicine. Refer to the Instructions for Use that come with your medication packaging. Use exactly as directed. Take your medicine at regular intervals. Do not take your medicine more often than directed. °It is important that you put your used needles and syringes in a special sharps container. Do not put them in a trash can. If you do not have a sharps container, call your pharmacist  or healthcare provider to get one. °Talk to your pediatrician regarding the use of this medicine in children. While this drug may be prescribed for children as young as 7 months for selected conditions, precautions do apply. °Overdosage: If you think you have taken too much of this medicine contact a poison control center or emergency room at once. °NOTE: This medicine is only for you. Do not share this medicine with others. °What if I miss a dose? °It is important not to miss your dose. Call your doctor or health care professional if you miss a dose. °What may interact with this medication? °This medicine may interact with the following medications: °medicines that may cause a release of neutrophils, such as lithium °This list may not describe all possible interactions. Give your health care provider a list of all the medicines, herbs, non-prescription drugs, or dietary supplements you use. Also tell them if you smoke, drink alcohol, or use illegal drugs. Some items may interact with your medicine. °What should I watch for while using this medication? °Your condition will be monitored carefully while you are receiving this medicine. °You may need blood work done while you are taking this medicine. °Talk to your health care provider about your risk of cancer. You may be more at risk for certain types of cancer if you take this medicine. °What side effects may I notice from receiving this medication? °Side effects that you should report to your doctor or health care professional as soon as possible: °allergic reactions like skin rash, itching or hives, swelling of the face, lips, or tongue °back pain °dizziness or feeling faint °fever °pain, redness, or   irritation at site where injected °pinpoint red spots on the skin °shortness of breath or breathing problems °signs and symptoms of kidney injury like trouble passing urine, change in the amount of urine, or red or dark-brown urine °stomach or side pain, or pain at  the shoulder °swelling °tiredness °unusual bleeding or bruising °Side effects that usually do not require medical attention (report to your doctor or health care professional if they continue or are bothersome): °bone pain °cough °diarrhea °hair loss °headache °muscle pain °This list may not describe all possible side effects. Call your doctor for medical advice about side effects. You may report side effects to FDA at 1-800-FDA-1088. °Where should I keep my medication? °Keep out of the reach of children. °Store in a refrigerator between 2 and 8 degrees C (36 and 46 degrees F). Do not freeze. Keep in carton to protect from light. Throw away this medicine if vials or syringes are left out of the refrigerator for more than 24 hours. Throw away any unused medicine after the expiration date. °NOTE: This sheet is a summary. It may not cover all possible information. If you have questions about this medicine, talk to your doctor, pharmacist, or health care provider. °© 2022 [employer] (2021-02-04 00:00:00) ° °

## 2021-07-16 ENCOUNTER — Inpatient Hospital Stay: Payer: PRIVATE HEALTH INSURANCE

## 2021-07-16 VITALS — BP 101/81 | HR 117 | Temp 97.9°F | Resp 18 | Ht 68.0 in | Wt 177.0 lb

## 2021-07-16 DIAGNOSIS — Z5111 Encounter for antineoplastic chemotherapy: Secondary | ICD-10-CM | POA: Diagnosis not present

## 2021-07-16 DIAGNOSIS — C182 Malignant neoplasm of ascending colon: Secondary | ICD-10-CM

## 2021-07-16 MED ORDER — FILGRASTIM-SNDZ 480 MCG/0.8ML IJ SOSY
480.0000 ug | PREFILLED_SYRINGE | Freq: Once | INTRAMUSCULAR | Status: AC
  Administered 2021-07-16: 480 ug via SUBCUTANEOUS
  Filled 2021-07-16: qty 0.8

## 2021-07-16 NOTE — Progress Notes (Signed)
1555:PT STABLE AT TIME OF DISCHARGE

## 2021-07-16 NOTE — Patient Instructions (Signed)
Filgrastim, G-CSF injection °What is this medication? °FILGRASTIM, G-CSF (fil GRA stim) is a granulocyte colony-stimulating factor that stimulates the growth of neutrophils, a type of white blood cell (WBC) important in the body's fight against infection. It is used to reduce the incidence of fever and infection in patients with certain types of cancer who are receiving chemotherapy that affects the bone marrow, to stimulate blood cell production for removal of WBCs from the body prior to a bone marrow transplantation, to reduce the incidence of fever and infection in patients who have severe chronic neutropenia, and to improve survival outcomes following high-dose radiation exposure that is toxic to the bone marrow. °This medicine may be used for other purposes; ask your health care provider or pharmacist if you have questions. °COMMON BRAND NAME(S): Neupogen, Nivestym, Releuko, Zarxio °What should I tell my care team before I take this medication? °They need to know if you have any of these conditions: °kidney disease °latex allergy °ongoing radiation therapy °sickle cell disease °an unusual or allergic reaction to filgrastim, pegfilgrastim, other medicines, foods, dyes, or preservatives °pregnant or trying to get pregnant °breast-feeding °How should I use this medication? °This medicine is for injection under the skin or infusion into a vein. As an infusion into a vein, it is usually given by a health care professional in a hospital or clinic setting. If you get this medicine at home, you will be taught how to prepare and give this medicine. Refer to the Instructions for Use that come with your medication packaging. Use exactly as directed. Take your medicine at regular intervals. Do not take your medicine more often than directed. °It is important that you put your used needles and syringes in a special sharps container. Do not put them in a trash can. If you do not have a sharps container, call your pharmacist  or healthcare provider to get one. °Talk to your pediatrician regarding the use of this medicine in children. While this drug may be prescribed for children as young as 7 months for selected conditions, precautions do apply. °Overdosage: If you think you have taken too much of this medicine contact a poison control center or emergency room at once. °NOTE: This medicine is only for you. Do not share this medicine with others. °What if I miss a dose? °It is important not to miss your dose. Call your doctor or health care professional if you miss a dose. °What may interact with this medication? °This medicine may interact with the following medications: °medicines that may cause a release of neutrophils, such as lithium °This list may not describe all possible interactions. Give your health care provider a list of all the medicines, herbs, non-prescription drugs, or dietary supplements you use. Also tell them if you smoke, drink alcohol, or use illegal drugs. Some items may interact with your medicine. °What should I watch for while using this medication? °Your condition will be monitored carefully while you are receiving this medicine. °You may need blood work done while you are taking this medicine. °Talk to your health care provider about your risk of cancer. You may be more at risk for certain types of cancer if you take this medicine. °What side effects may I notice from receiving this medication? °Side effects that you should report to your doctor or health care professional as soon as possible: °allergic reactions like skin rash, itching or hives, swelling of the face, lips, or tongue °back pain °dizziness or feeling faint °fever °pain, redness, or   irritation at site where injected °pinpoint red spots on the skin °shortness of breath or breathing problems °signs and symptoms of kidney injury like trouble passing urine, change in the amount of urine, or red or dark-brown urine °stomach or side pain, or pain at  the shoulder °swelling °tiredness °unusual bleeding or bruising °Side effects that usually do not require medical attention (report to your doctor or health care professional if they continue or are bothersome): °bone pain °cough °diarrhea °hair loss °headache °muscle pain °This list may not describe all possible side effects. Call your doctor for medical advice about side effects. You may report side effects to FDA at 1-800-FDA-1088. °Where should I keep my medication? °Keep out of the reach of children. °Store in a refrigerator between 2 and 8 degrees C (36 and 46 degrees F). Do not freeze. Keep in carton to protect from light. Throw away this medicine if vials or syringes are left out of the refrigerator for more than 24 hours. Throw away any unused medicine after the expiration date. °NOTE: This sheet is a summary. It may not cover all possible information. If you have questions about this medicine, talk to your doctor, pharmacist, or health care provider. °© 2022 [employer] (2021-02-04 00:00:00) ° °

## 2021-07-17 ENCOUNTER — Inpatient Hospital Stay: Payer: PRIVATE HEALTH INSURANCE

## 2021-07-18 LAB — CBC: RBC: 3.75 — AB (ref 3.87–5.11)

## 2021-07-18 LAB — HEPATIC FUNCTION PANEL
ALT: 26 (ref 7–35)
AST: 25 (ref 13–35)
Alkaline Phosphatase: 195 — AB (ref 25–125)
Bilirubin, Total: 0.5

## 2021-07-18 LAB — BASIC METABOLIC PANEL
BUN: 13 (ref 4–21)
CO2: 20 (ref 13–22)
Chloride: 105 (ref 99–108)
Creatinine: 1.4 — AB (ref 0.5–1.1)
Glucose: 208
Potassium: 3.5 (ref 3.4–5.3)
Sodium: 138 (ref 137–147)

## 2021-07-18 LAB — CBC AND DIFFERENTIAL
HCT: 38 (ref 36–46)
Hemoglobin: 12.6 (ref 12.0–16.0)
Neutrophils Absolute: 4.82
Platelets: 116 — AB (ref 150–399)
WBC: 7.2

## 2021-07-18 LAB — COMPREHENSIVE METABOLIC PANEL
Albumin: 4.1 (ref 3.5–5.0)
Calcium: 9 (ref 8.7–10.7)

## 2021-07-21 ENCOUNTER — Inpatient Hospital Stay (INDEPENDENT_AMBULATORY_CARE_PROVIDER_SITE_OTHER): Payer: PRIVATE HEALTH INSURANCE | Admitting: Oncology

## 2021-07-21 ENCOUNTER — Encounter: Payer: Self-pay | Admitting: Oncology

## 2021-07-21 ENCOUNTER — Other Ambulatory Visit: Payer: Self-pay

## 2021-07-21 VITALS — BP 117/75 | HR 101 | Temp 98.0°F | Resp 14 | Ht 68.0 in | Wt 179.5 lb

## 2021-07-21 DIAGNOSIS — C182 Malignant neoplasm of ascending colon: Secondary | ICD-10-CM | POA: Diagnosis not present

## 2021-07-21 MED FILL — Fluorouracil IV Soln 5 GM/100ML (50 MG/ML): INTRAVENOUS | Qty: 100 | Status: AC

## 2021-07-21 MED FILL — Fosaprepitant Dimeglumine For IV Infusion 150 MG (Base Eq): INTRAVENOUS | Qty: 5 | Status: AC

## 2021-07-21 MED FILL — Leucovorin Calcium For Inj 350 MG: INTRAMUSCULAR | Qty: 41.8 | Status: AC

## 2021-07-21 MED FILL — Irinotecan HCl Inj 100 MG/5ML (20 MG/ML): INTRAVENOUS | Qty: 14 | Status: AC

## 2021-07-21 MED FILL — Dexamethasone Sodium Phosphate Inj 100 MG/10ML: INTRAMUSCULAR | Qty: 1 | Status: AC

## 2021-07-21 MED FILL — Bevacizumab-bvzr IV Soln 400 MG/16ML (For Infusion): INTRAVENOUS | Qty: 16 | Status: AC

## 2021-07-21 MED FILL — Fluorouracil IV Soln 2.5 GM/50ML (50 MG/ML): INTRAVENOUS | Qty: 17 | Status: AC

## 2021-07-22 ENCOUNTER — Inpatient Hospital Stay: Payer: PRIVATE HEALTH INSURANCE

## 2021-07-22 VITALS — BP 125/86 | HR 105 | Temp 97.9°F | Resp 18 | Ht 68.0 in | Wt 181.0 lb

## 2021-07-22 DIAGNOSIS — Z5111 Encounter for antineoplastic chemotherapy: Secondary | ICD-10-CM | POA: Diagnosis not present

## 2021-07-22 DIAGNOSIS — C182 Malignant neoplasm of ascending colon: Secondary | ICD-10-CM

## 2021-07-22 MED ORDER — PALONOSETRON HCL INJECTION 0.25 MG/5ML
0.2500 mg | Freq: Once | INTRAVENOUS | Status: AC
  Administered 2021-07-22: 0.25 mg via INTRAVENOUS
  Filled 2021-07-22: qty 5

## 2021-07-22 MED ORDER — ATROPINE SULFATE 1 MG/ML IV SOLN
0.5000 mg | Freq: Once | INTRAVENOUS | Status: AC | PRN
  Administered 2021-07-22: 0.5 mg via INTRAVENOUS
  Filled 2021-07-22: qty 1

## 2021-07-22 MED ORDER — SODIUM CHLORIDE 0.9 % IV SOLN
10.0000 mg | Freq: Once | INTRAVENOUS | Status: AC
  Administered 2021-07-22: 10 mg via INTRAVENOUS
  Filled 2021-07-22: qty 10

## 2021-07-22 MED ORDER — SODIUM CHLORIDE 0.9% FLUSH: 10.0000 mL | INTRAVENOUS | Status: DC | PRN

## 2021-07-22 MED ORDER — SODIUM CHLORIDE 0.9 % IV SOLN
2400.0000 mg/m2 | INTRAVENOUS | Status: DC
  Administered 2021-07-22: 5000 mg via INTRAVENOUS
  Filled 2021-07-22: qty 100

## 2021-07-22 MED ORDER — IRINOTECAN HCL CHEMO INJECTION 100 MG/5ML
135.0000 mg/m2 | Freq: Once | INTRAVENOUS | Status: AC
  Administered 2021-07-22: 280 mg via INTRAVENOUS
  Filled 2021-07-22: qty 10

## 2021-07-22 MED ORDER — SODIUM CHLORIDE 0.9 % IV SOLN
150.0000 mg | Freq: Once | INTRAVENOUS | Status: AC
  Administered 2021-07-22: 150 mg via INTRAVENOUS
  Filled 2021-07-22: qty 150

## 2021-07-22 MED ORDER — SODIUM CHLORIDE 0.9 % IV SOLN: Freq: Once | INTRAVENOUS | Status: AC

## 2021-07-22 MED ORDER — LEUCOVORIN CALCIUM INJECTION 350 MG
400.0000 mg/m2 | Freq: Once | INTRAVENOUS | Status: AC
  Administered 2021-07-22: 836 mg via INTRAVENOUS
  Filled 2021-07-22: qty 41.8

## 2021-07-22 MED ORDER — SODIUM CHLORIDE 0.9 % IV SOLN
5.0000 mg/kg | Freq: Once | INTRAVENOUS | Status: AC
  Administered 2021-07-22: 400 mg via INTRAVENOUS
  Filled 2021-07-22: qty 16

## 2021-07-22 MED ORDER — FLUOROURACIL CHEMO INJECTION 2.5 GM/50ML
400.0000 mg/m2 | Freq: Once | INTRAVENOUS | Status: AC
  Administered 2021-07-22: 850 mg via INTRAVENOUS
  Filled 2021-07-22: qty 17

## 2021-07-22 NOTE — Patient Instructions (Signed)
Irinotecan injection What is this medication? IRINOTECAN (ir in oh TEE kan ) is a chemotherapy drug. It is used to treat colon and rectal cancer. This medicine may be used for other purposes; ask your health care provider or pharmacist if you have questions. COMMON BRAND NAME(S): Camptosar What should I tell my care team before I take this medication? They need to know if you have any of these conditions: dehydration diarrhea infection (especially a virus infection such as chickenpox, cold sores, or herpes) liver disease low blood counts, like low white cell, platelet, or red cell counts low levels of calcium, magnesium, or potassium in the blood recent or ongoing radiation therapy an unusual or allergic reaction to irinotecan, other medicines, foods, dyes, or preservatives pregnant or trying to get pregnant breast-feeding How should I use this medication? This drug is given as an infusion into a vein. It is administered in a hospital or clinic by a specially trained health care professional. Talk to your pediatrician regarding the use of this medicine in children. Special care may be needed. Overdosage: If you think you have taken too much of this medicine contact a poison control center or emergency room at once. NOTE: This medicine is only for you. Do not share this medicine with others. What if I miss a dose? It is important not to miss your dose. Call your doctor or health care professional if you are unable to keep an appointment. What may interact with this medication? Do not take this medicine with any of the following medications: cobicistat itraconazole This medicine may interact with the following medications: antiviral medicines for HIV or AIDS certain antibiotics like rifampin or rifabutin certain medicines for fungal infections like ketoconazole, posaconazole, and voriconazole certain medicines for seizures like carbamazepine, phenobarbital,  phenotoin clarithromycin gemfibrozil nefazodone [facility]. John's Wort This list may not describe all possible interactions. Give your health care provider a list of all the medicines, herbs, non-prescription drugs, or dietary supplements you use. Also tell them if you smoke, drink alcohol, or use illegal drugs. Some items may interact with your medicine. What should I watch for while using this medication? Your condition will be monitored carefully while you are receiving this medicine. You will need important blood work done while you are taking this medicine. This drug may make you feel generally unwell. This is not uncommon, as chemotherapy can affect healthy cells as well as cancer cells. Report any side effects. Continue your course of treatment even though you feel ill unless your doctor tells you to stop. In some cases, you may be given additional medicines to help with side effects. Follow all directions for their use. You may get drowsy or dizzy. Do not drive, use machinery, or do anything that needs mental alertness until you know how this medicine affects you. Do not stand or sit up quickly, especially if you are an older patient. This reduces the risk of dizzy or fainting spells. Call your health care professional for advice if you get a fever, chills, or sore throat, or other symptoms of a cold or flu. Do not treat yourself. This medicine decreases your body's ability to fight infections. Try to avoid being around people who are sick. Avoid taking products that contain aspirin, acetaminophen, ibuprofen, naproxen, or ketoprofen unless instructed by your doctor. These medicines may hide a fever. This medicine may increase your risk to bruise or bleed. Call your doctor or health care professional if you notice any unusual bleeding. Be careful brushing and  flossing your teeth or using a toothpick because you may get an infection or bleed more easily. If you have any dental work done, tell your  dentist you are receiving this medicine. Do not become pregnant while taking this medicine or for 6 months after stopping it. Women should inform their health care professional if they wish to become pregnant or think they might be pregnant. Men should not father a child while taking this medicine and for 3 months after stopping it. There is potential for serious side effects to an unborn child. Talk to your health care professional for more information. Do not breast-feed an infant while taking this medicine or for 7 days after stopping it. This medicine has caused ovarian failure in some women. This medicine may make it more difficult to get pregnant. Talk to your health care professional if you are concerned about your fertility. This medicine has caused decreased sperm counts in some men. This may make it more difficult to father a child. Talk to your health care professional if you are concerned about your fertility. What side effects may I notice from receiving this medication? Side effects that you should report to your doctor or health care professional as soon as possible: allergic reactions like skin rash, itching or hives, swelling of the face, lips, or tongue chest pain diarrhea flushing, runny nose, sweating during infusion low blood counts - this medicine may decrease the number of white blood cells, red blood cells and platelets. You may be at increased risk for infections and bleeding. nausea, vomiting pain, swelling, warmth in the leg signs of decreased platelets or bleeding - bruising, pinpoint red spots on the skin, black, tarry stools, blood in the urine signs of infection - fever or chills, cough, sore throat, pain or difficulty passing urine signs of decreased red blood cells - unusually weak or tired, fainting spells, lightheadedness Side effects that usually do not require medical attention (report to your doctor or health care professional if they continue or are  bothersome): constipation hair loss headache loss of appetite mouth sores stomach pain This list may not describe all possible side effects. Call your doctor for medical advice about side effects. You may report side effects to FDA at 1-800-FDA-1088. Where should I keep my medication? This drug is given in a hospital or clinic and will not be stored at home. NOTE: This sheet is a summary. It may not cover all possible information. If you have questions about this medicine, talk to your doctor, pharmacist, or health care provider.  2022 [employer] (2021-02-04 00:00:00) Leucovorin injection What is this medication? LEUCOVORIN (loo koe VOR in) is used to prevent or treat the harmful effects of some medicines. This medicine is used to treat anemia caused by a low amount of folic acid in the body. It is also used with 5-fluorouracil (5-FU) to treat colon cancer. This medicine may be used for other purposes; ask your health care provider or pharmacist if you have questions. What should I tell my care team before I take this medication? They need to know if you have any of these conditions: anemia from low levels of vitamin B-12 in the blood an unusual or allergic reaction to leucovorin, folic acid, other medicines, foods, dyes, or preservatives pregnant or trying to get pregnant breast-feeding How should I use this medication? This medicine is for injection into a muscle or into a vein. It is given by a health care professional in a hospital or clinic setting.  Talk to your pediatrician regarding the use of this medicine in children. Special care may be needed. Overdosage: If you think you have taken too much of this medicine contact a poison control center or emergency room at once. NOTE: This medicine is only for you. Do not share this medicine with others. What if I miss a dose? This does not apply. What may interact with this  medication? capecitabine fluorouracil phenobarbital phenytoin primidone trimethoprim-sulfamethoxazole This list may not describe all possible interactions. Give your health care provider a list of all the medicines, herbs, non-prescription drugs, or dietary supplements you use. Also tell them if you smoke, drink alcohol, or use illegal drugs. Some items may interact with your medicine. What should I watch for while using this medication? Your condition will be monitored carefully while you are receiving this medicine. This medicine may increase the side effects of 5-fluorouracil, 5-FU. Tell your doctor or health care professional if you have diarrhea or mouth sores that do not get better or that get worse. What side effects may I notice from receiving this medication? Side effects that you should report to your doctor or health care professional as soon as possible: allergic reactions like skin rash, itching or hives, swelling of the face, lips, or tongue breathing problems fever, infection mouth sores unusual bleeding or bruising unusually weak or tired Side effects that usually do not require medical attention (report to your doctor or health care professional if they continue or are bothersome): constipation or diarrhea loss of appetite nausea, vomiting This list may not describe all possible side effects. Call your doctor for medical advice about side effects. You may report side effects to FDA at 1-800-FDA-1088. Where should I keep my medication? This drug is given in a hospital or clinic and will not be stored at home. NOTE: This sheet is a summary. It may not cover all possible information. If you have questions about this medicine, talk to your doctor, pharmacist, or health care provider.  2022 [employer] Standard (2007-11-24 00:00:00) Fluorouracil, 5-FU injection What is this medication? FLUOROURACIL, 5-FU (flure oh YOOR a sil) is a chemotherapy drug. It slows the growth of  cancer cells. This medicine is used to treat many types of cancer like breast cancer, colon or rectal cancer, pancreatic cancer, and stomach cancer. This medicine may be used for other purposes; ask your health care provider or pharmacist if you have questions. COMMON BRAND NAME(S): Adrucil What should I tell my care team before I take this medication? They need to know if you have any of these conditions: blood disorders dihydropyrimidine dehydrogenase (DPD) deficiency infection (especially a virus infection such as chickenpox, cold sores, or herpes) kidney disease liver disease malnourished, poor nutrition recent or ongoing radiation therapy an unusual or allergic reaction to fluorouracil, other chemotherapy, other medicines, foods, dyes, or preservatives pregnant or trying to get pregnant breast-feeding How should I use this medication? This drug is given as an infusion or injection into a vein. It is administered in a hospital or clinic by a specially trained health care professional. Talk to your pediatrician regarding the use of this medicine in children. Special care may be needed. Overdosage: If you think you have taken too much of this medicine contact a poison control center or emergency room at once. NOTE: This medicine is only for you. Do not share this medicine with others. What if I miss a dose? It is important not to miss your dose. Call your doctor or health care professional  if you are unable to keep an appointment. What may interact with this medication? Do not take this medicine with any of the following medications: live virus vaccines This medicine may also interact with the following medications: medicines that treat or prevent blood clots like warfarin, enoxaparin, and dalteparin This list may not describe all possible interactions. Give your health care provider a list of all the medicines, herbs, non-prescription drugs, or dietary supplements you use. Also tell  them if you smoke, drink alcohol, or use illegal drugs. Some items may interact with your medicine. What should I watch for while using this medication? Visit your doctor for checks on your progress. This drug may make you feel generally unwell. This is not uncommon, as chemotherapy can affect healthy cells as well as cancer cells. Report any side effects. Continue your course of treatment even though you feel ill unless your doctor tells you to stop. In some cases, you may be given additional medicines to help with side effects. Follow all directions for their use. Call your doctor or health care professional for advice if you get a fever, chills or sore throat, or other symptoms of a cold or flu. Do not treat yourself. This drug decreases your body's ability to fight infections. Try to avoid being around people who are sick. This medicine may increase your risk to bruise or bleed. Call your doctor or health care professional if you notice any unusual bleeding. Be careful brushing and flossing your teeth or using a toothpick because you may get an infection or bleed more easily. If you have any dental work done, tell your dentist you are receiving this medicine. Avoid taking products that contain aspirin, acetaminophen, ibuprofen, naproxen, or ketoprofen unless instructed by your doctor. These medicines may hide a fever. Do not become pregnant while taking this medicine. Women should inform their doctor if they wish to become pregnant or think they might be pregnant. There is a potential for serious side effects to an unborn child. Talk to your health care professional or pharmacist for more information. Do not breast-feed an infant while taking this medicine. Men should inform their doctor if they wish to father a child. This medicine may lower sperm counts. Do not treat diarrhea with over the counter products. Contact your doctor if you have diarrhea that lasts more than 2 days or if it is severe and  watery. This medicine can make you more sensitive to the sun. Keep out of the sun. If you cannot avoid being in the sun, wear protective clothing and use sunscreen. Do not use sun lamps or tanning beds/booths. What side effects may I notice from receiving this medication? Side effects that you should report to your doctor or health care professional as soon as possible: allergic reactions like skin rash, itching or hives, swelling of the face, lips, or tongue low blood counts - this medicine may decrease the number of white blood cells, red blood cells and platelets. You may be at increased risk for infections and bleeding. signs of infection - fever or chills, cough, sore throat, pain or difficulty passing urine signs of decreased platelets or bleeding - bruising, pinpoint red spots on the skin, black, tarry stools, blood in the urine signs of decreased red blood cells - unusually weak or tired, fainting spells, lightheadedness breathing problems changes in vision chest pain mouth sores nausea and vomiting pain, swelling, redness at site where injected pain, tingling, numbness in the hands or feet redness, swelling, or sores  on hands or feet stomach pain unusual bleeding Side effects that usually do not require medical attention (report to your doctor or health care professional if they continue or are bothersome): changes in finger or toe nails diarrhea dry or itchy skin hair loss headache loss of appetite sensitivity of eyes to the light stomach upset unusually teary eyes This list may not describe all possible side effects. Call your doctor for medical advice about side effects. You may report side effects to FDA at 1-800-FDA-1088. Where should I keep my medication? This drug is given in a hospital or clinic and will not be stored at home. NOTE: This sheet is a summary. It may not cover all possible information. If you have questions about this medicine, talk to your doctor,  pharmacist, or health care provider.  2022 [employer] (2021-02-04 00:00:00) Bevacizumab injection What is this medication? BEVACIZUMAB (be va SIZ yoo mab) is a monoclonal antibody. It is used to treat many types of cancer. This medicine may be used for other purposes; ask your health care provider or pharmacist if you have questions. COMMON BRAND NAME(S): Alymsys, Avastin, MVASI, Noah Charon What should I tell my care team before I take this medication? They need to know if you have any of these conditions: diabetes heart disease high blood pressure history of coughing up blood prior anthracycline chemotherapy (e.g., doxorubicin, daunorubicin, epirubicin) recent or ongoing radiation therapy recent or planning to have surgery stroke an unusual or allergic reaction to bevacizumab, hamster proteins, mouse proteins, other medicines, foods, dyes, or preservatives pregnant or trying to get pregnant breast-feeding How should I use this medication? This medicine is for infusion into a vein. It is given by a health care professional in a hospital or clinic setting. Talk to your pediatrician regarding the use of this medicine in children. Special care may be needed. Overdosage: If you think you have taken too much of this medicine contact a poison control center or emergency room at once. NOTE: This medicine is only for you. Do not share this medicine with others. What if I miss a dose? It is important not to miss your dose. Call your doctor or health care professional if you are unable to keep an appointment. What may interact with this medication? Interactions are not expected. This list may not describe all possible interactions. Give your health care provider a list of all the medicines, herbs, non-prescription drugs, or dietary supplements you use. Also tell them if you smoke, drink alcohol, or use illegal drugs. Some items may interact with your medicine. What should I watch for  while using this medication? Your condition will be monitored carefully while you are receiving this medicine. You will need important blood work and urine testing done while you are taking this medicine. This medicine may increase your risk to bruise or bleed. Call your doctor or health care professional if you notice any unusual bleeding. Before having surgery, talk to your health care provider to make sure it is ok. This drug can increase the risk of poor healing of your surgical site or wound. You will need to stop this drug for 28 days before surgery. After surgery, wait at least 28 days before restarting this drug. Make sure the surgical site or wound is healed enough before restarting this drug. Talk to your health care provider if questions. Do not become pregnant while taking this medicine or for 6 months after stopping it. Women should inform their doctor if they wish to become pregnant  or think they might be pregnant. There is a potential for serious side effects to an unborn child. Talk to your health care professional or pharmacist for more information. Do not breast-feed an infant while taking this medicine and for 6 months after the last dose. This medicine has caused ovarian failure in some women. This medicine may interfere with the ability to have a child. You should talk to your doctor or health care professional if you are concerned about your fertility. What side effects may I notice from receiving this medication? Side effects that you should report to your doctor or health care professional as soon as possible: allergic reactions like skin rash, itching or hives, swelling of the face, lips, or tongue chest pain or chest tightness chills coughing up blood high fever seizures severe constipation signs and symptoms of bleeding such as bloody or black, tarry stools; red or dark-brown urine; spitting up blood or brown material that looks like coffee grounds; red spots on the skin;  unusual bruising or bleeding from the eye, gums, or nose signs and symptoms of a blood clot such as breathing problems; chest pain; severe, sudden headache; pain, swelling, warmth in the leg signs and symptoms of a stroke like changes in vision; confusion; trouble speaking or understanding; severe headaches; sudden numbness or weakness of the face, arm or leg; trouble walking; dizziness; loss of balance or coordination stomach pain sweating swelling of legs or ankles vomiting weight gain Side effects that usually do not require medical attention (report to your doctor or health care professional if they continue or are bothersome): back pain changes in taste decreased appetite dry skin nausea tiredness This list may not describe all possible side effects. Call your doctor for medical advice about side effects. You may report side effects to FDA at 1-800-FDA-1088. Where should I keep my medication? This drug is given in a hospital or clinic and will not be stored at home. NOTE: This sheet is a summary. It may not cover all possible information. If you have questions about this medicine, talk to your doctor, pharmacist, or health care provider.  2022 [employer] (2021-02-04 00:00:00)

## 2021-07-24 ENCOUNTER — Other Ambulatory Visit: Payer: Self-pay

## 2021-07-24 ENCOUNTER — Inpatient Hospital Stay: Payer: PRIVATE HEALTH INSURANCE

## 2021-07-24 ENCOUNTER — Encounter: Payer: Self-pay | Admitting: Oncology

## 2021-07-24 VITALS — BP 110/60 | HR 98 | Temp 98.6°F | Resp 20 | Ht 67.5 in | Wt 183.1 lb

## 2021-07-24 DIAGNOSIS — Z5111 Encounter for antineoplastic chemotherapy: Secondary | ICD-10-CM | POA: Diagnosis not present

## 2021-07-24 DIAGNOSIS — C182 Malignant neoplasm of ascending colon: Secondary | ICD-10-CM

## 2021-07-24 MED ORDER — SODIUM CHLORIDE 0.9% FLUSH
10.0000 mL | INTRAVENOUS | Status: DC | PRN
  Administered 2021-07-24: 10 mL

## 2021-07-24 MED ORDER — HEPARIN SOD (PORK) LOCK FLUSH 100 UNIT/ML IV SOLN
500.0000 [IU] | Freq: Once | INTRAVENOUS | Status: AC | PRN
  Administered 2021-07-24: 500 [IU]

## 2021-07-29 ENCOUNTER — Inpatient Hospital Stay: Payer: PRIVATE HEALTH INSURANCE

## 2021-07-29 VITALS — BP 123/86 | HR 97 | Temp 98.0°F | Resp 18 | Wt 178.0 lb

## 2021-07-29 DIAGNOSIS — C182 Malignant neoplasm of ascending colon: Secondary | ICD-10-CM

## 2021-07-29 DIAGNOSIS — Z5111 Encounter for antineoplastic chemotherapy: Secondary | ICD-10-CM | POA: Diagnosis not present

## 2021-07-29 MED ORDER — FILGRASTIM-SNDZ 480 MCG/0.8ML IJ SOSY
480.0000 ug | PREFILLED_SYRINGE | Freq: Once | INTRAMUSCULAR | Status: AC
  Administered 2021-07-29: 480 ug via SUBCUTANEOUS
  Filled 2021-07-29: qty 0.8

## 2021-07-29 NOTE — Patient Instructions (Signed)
Filgrastim, G-CSF injection °What is this medication? °FILGRASTIM, G-CSF (fil GRA stim) is a granulocyte colony-stimulating factor that stimulates the growth of neutrophils, a type of white blood cell (WBC) important in the body's fight against infection. It is used to reduce the incidence of fever and infection in patients with certain types of cancer who are receiving chemotherapy that affects the bone marrow, to stimulate blood cell production for removal of WBCs from the body prior to a bone marrow transplantation, to reduce the incidence of fever and infection in patients who have severe chronic neutropenia, and to improve survival outcomes following high-dose radiation exposure that is toxic to the bone marrow. °This medicine may be used for other purposes; ask your health care provider or pharmacist if you have questions. °COMMON BRAND NAME(S): Neupogen, Nivestym, Releuko, Zarxio °What should I tell my care team before I take this medication? °They need to know if you have any of these conditions: °kidney disease °latex allergy °ongoing radiation therapy °sickle cell disease °an unusual or allergic reaction to filgrastim, pegfilgrastim, other medicines, foods, dyes, or preservatives °pregnant or trying to get pregnant °breast-feeding °How should I use this medication? °This medicine is for injection under the skin or infusion into a vein. As an infusion into a vein, it is usually given by a health care professional in a hospital or clinic setting. If you get this medicine at home, you will be taught how to prepare and give this medicine. Refer to the Instructions for Use that come with your medication packaging. Use exactly as directed. Take your medicine at regular intervals. Do not take your medicine more often than directed. °It is important that you put your used needles and syringes in a special sharps container. Do not put them in a trash can. If you do not have a sharps container, call your pharmacist  or healthcare provider to get one. °Talk to your pediatrician regarding the use of this medicine in children. While this drug may be prescribed for children as young as 7 months for selected conditions, precautions do apply. °Overdosage: If you think you have taken too much of this medicine contact a poison control center or emergency room at once. °NOTE: This medicine is only for you. Do not share this medicine with others. °What if I miss a dose? °It is important not to miss your dose. Call your doctor or health care professional if you miss a dose. °What may interact with this medication? °This medicine may interact with the following medications: °medicines that may cause a release of neutrophils, such as lithium °This list may not describe all possible interactions. Give your health care provider a list of all the medicines, herbs, non-prescription drugs, or dietary supplements you use. Also tell them if you smoke, drink alcohol, or use illegal drugs. Some items may interact with your medicine. °What should I watch for while using this medication? °Your condition will be monitored carefully while you are receiving this medicine. °You may need blood work done while you are taking this medicine. °Talk to your health care provider about your risk of cancer. You may be more at risk for certain types of cancer if you take this medicine. °What side effects may I notice from receiving this medication? °Side effects that you should report to your doctor or health care professional as soon as possible: °allergic reactions like skin rash, itching or hives, swelling of the face, lips, or tongue °back pain °dizziness or feeling faint °fever °pain, redness, or   irritation at site where injected °pinpoint red spots on the skin °shortness of breath or breathing problems °signs and symptoms of kidney injury like trouble passing urine, change in the amount of urine, or red or dark-brown urine °stomach or side pain, or pain at  the shoulder °swelling °tiredness °unusual bleeding or bruising °Side effects that usually do not require medical attention (report to your doctor or health care professional if they continue or are bothersome): °bone pain °cough °diarrhea °hair loss °headache °muscle pain °This list may not describe all possible side effects. Call your doctor for medical advice about side effects. You may report side effects to FDA at 1-800-FDA-1088. °Where should I keep my medication? °Keep out of the reach of children. °Store in a refrigerator between 2 and 8 degrees C (36 and 46 degrees F). Do not freeze. Keep in carton to protect from light. Throw away this medicine if vials or syringes are left out of the refrigerator for more than 24 hours. Throw away any unused medicine after the expiration date. °NOTE: This sheet is a summary. It may not cover all possible information. If you have questions about this medicine, talk to your doctor, pharmacist, or health care provider. °© 2022 [employer] (2021-02-04 00:00:00) ° °

## 2021-07-30 ENCOUNTER — Other Ambulatory Visit: Payer: Self-pay | Admitting: Psychiatry

## 2021-07-30 ENCOUNTER — Inpatient Hospital Stay: Payer: PRIVATE HEALTH INSURANCE | Attending: Oncology

## 2021-07-30 ENCOUNTER — Other Ambulatory Visit: Payer: Self-pay

## 2021-07-30 VITALS — BP 114/87 | HR 110 | Temp 98.1°F | Resp 18 | Ht 67.5 in | Wt 175.5 lb

## 2021-07-30 DIAGNOSIS — C7931 Secondary malignant neoplasm of brain: Secondary | ICD-10-CM | POA: Insufficient documentation

## 2021-07-30 DIAGNOSIS — E119 Type 2 diabetes mellitus without complications: Secondary | ICD-10-CM | POA: Insufficient documentation

## 2021-07-30 DIAGNOSIS — Z803 Family history of malignant neoplasm of breast: Secondary | ICD-10-CM | POA: Insufficient documentation

## 2021-07-30 DIAGNOSIS — Z90710 Acquired absence of both cervix and uterus: Secondary | ICD-10-CM | POA: Diagnosis not present

## 2021-07-30 DIAGNOSIS — Z5111 Encounter for antineoplastic chemotherapy: Secondary | ICD-10-CM | POA: Insufficient documentation

## 2021-07-30 DIAGNOSIS — C189 Malignant neoplasm of colon, unspecified: Secondary | ICD-10-CM | POA: Insufficient documentation

## 2021-07-30 DIAGNOSIS — Z5189 Encounter for other specified aftercare: Secondary | ICD-10-CM | POA: Diagnosis not present

## 2021-07-30 DIAGNOSIS — F422 Mixed obsessional thoughts and acts: Secondary | ICD-10-CM

## 2021-07-30 DIAGNOSIS — F333 Major depressive disorder, recurrent, severe with psychotic symptoms: Secondary | ICD-10-CM

## 2021-07-30 DIAGNOSIS — C182 Malignant neoplasm of ascending colon: Secondary | ICD-10-CM

## 2021-07-30 MED ORDER — FILGRASTIM-SNDZ 480 MCG/0.8ML IJ SOSY
480.0000 ug | PREFILLED_SYRINGE | Freq: Once | INTRAMUSCULAR | Status: AC
  Administered 2021-07-30: 480 ug via SUBCUTANEOUS
  Filled 2021-07-30: qty 0.8

## 2021-07-30 NOTE — Patient Instructions (Signed)
Filgrastim, G-CSF injection °What is this medication? °FILGRASTIM, G-CSF (fil GRA stim) is a granulocyte colony-stimulating factor that stimulates the growth of neutrophils, a type of white blood cell (WBC) important in the body's fight against infection. It is used to reduce the incidence of fever and infection in patients with certain types of cancer who are receiving chemotherapy that affects the bone marrow, to stimulate blood cell production for removal of WBCs from the body prior to a bone marrow transplantation, to reduce the incidence of fever and infection in patients who have severe chronic neutropenia, and to improve survival outcomes following high-dose radiation exposure that is toxic to the bone marrow. °This medicine may be used for other purposes; ask your health care provider or pharmacist if you have questions. °COMMON BRAND NAME(S): Neupogen, Nivestym, Releuko, Zarxio °What should I tell my care team before I take this medication? °They need to know if you have any of these conditions: °kidney disease °latex allergy °ongoing radiation therapy °sickle cell disease °an unusual or allergic reaction to filgrastim, pegfilgrastim, other medicines, foods, dyes, or preservatives °pregnant or trying to get pregnant °breast-feeding °How should I use this medication? °This medicine is for injection under the skin or infusion into a vein. As an infusion into a vein, it is usually given by a health care professional in a hospital or clinic setting. If you get this medicine at home, you will be taught how to prepare and give this medicine. Refer to the Instructions for Use that come with your medication packaging. Use exactly as directed. Take your medicine at regular intervals. Do not take your medicine more often than directed. °It is important that you put your used needles and syringes in a special sharps container. Do not put them in a trash can. If you do not have a sharps container, call your pharmacist  or healthcare provider to get one. °Talk to your pediatrician regarding the use of this medicine in children. While this drug may be prescribed for children as young as 7 months for selected conditions, precautions do apply. °Overdosage: If you think you have taken too much of this medicine contact a poison control center or emergency room at once. °NOTE: This medicine is only for you. Do not share this medicine with others. °What if I miss a dose? °It is important not to miss your dose. Call your doctor or health care professional if you miss a dose. °What may interact with this medication? °This medicine may interact with the following medications: °medicines that may cause a release of neutrophils, such as lithium °This list may not describe all possible interactions. Give your health care provider a list of all the medicines, herbs, non-prescription drugs, or dietary supplements you use. Also tell them if you smoke, drink alcohol, or use illegal drugs. Some items may interact with your medicine. °What should I watch for while using this medication? °Your condition will be monitored carefully while you are receiving this medicine. °You may need blood work done while you are taking this medicine. °Talk to your health care provider about your risk of cancer. You may be more at risk for certain types of cancer if you take this medicine. °What side effects may I notice from receiving this medication? °Side effects that you should report to your doctor or health care professional as soon as possible: °allergic reactions like skin rash, itching or hives, swelling of the face, lips, or tongue °back pain °dizziness or feeling faint °fever °pain, redness, or   irritation at site where injected °pinpoint red spots on the skin °shortness of breath or breathing problems °signs and symptoms of kidney injury like trouble passing urine, change in the amount of urine, or red or dark-brown urine °stomach or side pain, or pain at  the shoulder °swelling °tiredness °unusual bleeding or bruising °Side effects that usually do not require medical attention (report to your doctor or health care professional if they continue or are bothersome): °bone pain °cough °diarrhea °hair loss °headache °muscle pain °This list may not describe all possible side effects. Call your doctor for medical advice about side effects. You may report side effects to FDA at 1-800-FDA-1088. °Where should I keep my medication? °Keep out of the reach of children. °Store in a refrigerator between 2 and 8 degrees C (36 and 46 degrees F). Do not freeze. Keep in carton to protect from light. Throw away this medicine if vials or syringes are left out of the refrigerator for more than 24 hours. Throw away any unused medicine after the expiration date. °NOTE: This sheet is a summary. It may not cover all possible information. If you have questions about this medicine, talk to your doctor, pharmacist, or health care provider. °© 2022 [employer] (2021-02-04 00:00:00) ° °

## 2021-07-31 ENCOUNTER — Inpatient Hospital Stay: Payer: PRIVATE HEALTH INSURANCE

## 2021-07-31 ENCOUNTER — Other Ambulatory Visit: Payer: Self-pay | Admitting: Psychiatry

## 2021-07-31 VITALS — BP 126/89 | HR 98 | Temp 97.8°F | Resp 18 | Wt 175.8 lb

## 2021-07-31 DIAGNOSIS — C182 Malignant neoplasm of ascending colon: Secondary | ICD-10-CM

## 2021-07-31 DIAGNOSIS — F333 Major depressive disorder, recurrent, severe with psychotic symptoms: Secondary | ICD-10-CM

## 2021-07-31 DIAGNOSIS — Z5111 Encounter for antineoplastic chemotherapy: Secondary | ICD-10-CM | POA: Diagnosis not present

## 2021-07-31 DIAGNOSIS — G3184 Mild cognitive impairment, so stated: Secondary | ICD-10-CM

## 2021-07-31 DIAGNOSIS — R530 Neoplastic (malignant) related fatigue: Secondary | ICD-10-CM

## 2021-07-31 MED ORDER — FILGRASTIM-SNDZ 480 MCG/0.8ML IJ SOSY
480.0000 ug | PREFILLED_SYRINGE | Freq: Once | INTRAMUSCULAR | Status: AC
  Administered 2021-07-31: 480 ug via SUBCUTANEOUS
  Filled 2021-07-31: qty 0.8

## 2021-07-31 NOTE — Progress Notes (Signed)
1523:PT STABLE AT TIME OF DISCHARGE ?

## 2021-07-31 NOTE — Patient Instructions (Signed)
Filgrastim, G-CSF injection °What is this medication? °FILGRASTIM, G-CSF (fil GRA stim) is a granulocyte colony-stimulating factor that stimulates the growth of neutrophils, a type of white blood cell (WBC) important in the body's fight against infection. It is used to reduce the incidence of fever and infection in patients with certain types of cancer who are receiving chemotherapy that affects the bone marrow, to stimulate blood cell production for removal of WBCs from the body prior to a bone marrow transplantation, to reduce the incidence of fever and infection in patients who have severe chronic neutropenia, and to improve survival outcomes following high-dose radiation exposure that is toxic to the bone marrow. °This medicine may be used for other purposes; ask your health care provider or pharmacist if you have questions. °COMMON BRAND NAME(S): Neupogen, Nivestym, Releuko, Zarxio °What should I tell my care team before I take this medication? °They need to know if you have any of these conditions: °kidney disease °latex allergy °ongoing radiation therapy °sickle cell disease °an unusual or allergic reaction to filgrastim, pegfilgrastim, other medicines, foods, dyes, or preservatives °pregnant or trying to get pregnant °breast-feeding °How should I use this medication? °This medicine is for injection under the skin or infusion into a vein. As an infusion into a vein, it is usually given by a health care professional in a hospital or clinic setting. If you get this medicine at home, you will be taught how to prepare and give this medicine. Refer to the Instructions for Use that come with your medication packaging. Use exactly as directed. Take your medicine at regular intervals. Do not take your medicine more often than directed. °It is important that you put your used needles and syringes in a special sharps container. Do not put them in a trash can. If you do not have a sharps container, call your pharmacist  or healthcare provider to get one. °Talk to your pediatrician regarding the use of this medicine in children. While this drug may be prescribed for children as young as 7 months for selected conditions, precautions do apply. °Overdosage: If you think you have taken too much of this medicine contact a poison control center or emergency room at once. °NOTE: This medicine is only for you. Do not share this medicine with others. °What if I miss a dose? °It is important not to miss your dose. Call your doctor or health care professional if you miss a dose. °What may interact with this medication? °This medicine may interact with the following medications: °medicines that may cause a release of neutrophils, such as lithium °This list may not describe all possible interactions. Give your health care provider a list of all the medicines, herbs, non-prescription drugs, or dietary supplements you use. Also tell them if you smoke, drink alcohol, or use illegal drugs. Some items may interact with your medicine. °What should I watch for while using this medication? °Your condition will be monitored carefully while you are receiving this medicine. °You may need blood work done while you are taking this medicine. °Talk to your health care provider about your risk of cancer. You may be more at risk for certain types of cancer if you take this medicine. °What side effects may I notice from receiving this medication? °Side effects that you should report to your doctor or health care professional as soon as possible: °allergic reactions like skin rash, itching or hives, swelling of the face, lips, or tongue °back pain °dizziness or feeling faint °fever °pain, redness, or   irritation at site where injected °pinpoint red spots on the skin °shortness of breath or breathing problems °signs and symptoms of kidney injury like trouble passing urine, change in the amount of urine, or red or dark-brown urine °stomach or side pain, or pain at  the shoulder °swelling °tiredness °unusual bleeding or bruising °Side effects that usually do not require medical attention (report to your doctor or health care professional if they continue or are bothersome): °bone pain °cough °diarrhea °hair loss °headache °muscle pain °This list may not describe all possible side effects. Call your doctor for medical advice about side effects. You may report side effects to FDA at 1-800-FDA-1088. °Where should I keep my medication? °Keep out of the reach of children. °Store in a refrigerator between 2 and 8 degrees C (36 and 46 degrees F). Do not freeze. Keep in carton to protect from light. Throw away this medicine if vials or syringes are left out of the refrigerator for more than 24 hours. Throw away any unused medicine after the expiration date. °NOTE: This sheet is a summary. It may not cover all possible information. If you have questions about this medicine, talk to your doctor, pharmacist, or health care provider. °© 2022 [employer] (2021-02-04 00:00:00) ° °

## 2021-08-01 ENCOUNTER — Other Ambulatory Visit: Payer: Self-pay

## 2021-08-01 ENCOUNTER — Ambulatory Visit
Admission: RE | Admit: 2021-08-01 | Discharge: 2021-08-01 | Disposition: A | Discharge status: Home / Self Care | Payer: PRIVATE HEALTH INSURANCE | Source: Ambulatory Visit | Attending: Internal Medicine | Admitting: Internal Medicine

## 2021-08-01 DIAGNOSIS — C7931 Secondary malignant neoplasm of brain: Secondary | ICD-10-CM

## 2021-08-01 DIAGNOSIS — Z95828 Presence of other vascular implants and grafts: Secondary | ICD-10-CM

## 2021-08-01 MED ORDER — HEPARIN SOD (PORK) LOCK FLUSH 100 UNIT/ML IV SOLN
500.0000 [IU] | Freq: Once | INTRAVENOUS | Status: AC
  Administered 2021-08-01: 500 [IU] via INTRAVENOUS

## 2021-08-01 MED ORDER — SODIUM CHLORIDE 0.9% FLUSH
10.0000 mL | INTRAVENOUS | Status: DC | PRN
  Administered 2021-08-01: 10 mL via INTRAVENOUS

## 2021-08-01 MED ORDER — GADOBENATE DIMEGLUMINE 529 MG/ML IV SOLN
17.0000 mL | Freq: Once | INTRAVENOUS | Status: AC | PRN
  Administered 2021-08-01: 17 mL via INTRAVENOUS

## 2021-08-04 ENCOUNTER — Inpatient Hospital Stay: Payer: PRIVATE HEALTH INSURANCE

## 2021-08-04 ENCOUNTER — Ambulatory Visit: Payer: PRIVATE HEALTH INSURANCE

## 2021-08-04 NOTE — Progress Notes (Signed)
?[facility]  ?[address] ?[place],  [place]  [address] ?[phone] ? ?Clinic Day:  08/11/2021 ? ?Referring physician: Greig Right, MD ? ?This document serves as a record of services personally performed by Marice Potter, MD. It was created on their behalf by Curry,Lauren E, a trained medical scribe. The creation of this record is based on the scribe's personal observations and the provider's statements to them. ? ?HISTORY OF PRESENT ILLNESS:  ?The patient is a 56 y.o. female with metastatic colon cancer, which includes spread of disease to her abdominal cavity, lungs, brain and right adrenal gland. She comes in today prior to her 26th cycle of palliative FOLFIRI/Avastin chemotherapy.  The patient claims to have tolerated her 25th cycle of treatment fairly well.  She has intermittent diarrhea and constipation over these past weeks, but the symptoms have not caused her any significant alteration in her daily quality of life.  She also denies having any new symptoms or findings which concern her overt signs of disease progression.   ?  ?With respect to her colon cancer history, she is status post a right hemicolectomy in early October 2018, followed by 12 cycles of adjuvant FOLFOX chemotherapy, which were completed in April 2019.  In December 2021, she underwent a left cerebellar metastatectomy, whose pathology was consistent with metastatic colon cancer.  Tumor testing did come back MMR normal.  CT scans recently showed evidence of her cancer being in multiple locations, which has her on palliative chemotherapy at this time.   ? ?PHYSICAL EXAM:  ?Blood pressure 120/79, pulse 95, temperature 98.1 ?F (36.7 ?C), resp. rate 16, height 5' 8"  (1.727 m), weight 180 lb 9.6 oz (81.9 kg), last menstrual period 02/22/2019, SpO2 96 %. ?Wt Readings from Last 3 Encounters:  ?08/11/21 180 lb 9.6 oz (81.9 kg)  ?08/05/21 182 lb 3.2 oz (82.6 kg)  ?07/31/21 175 lb 12 oz (79.7 kg)  ? ?Body  mass index is 27.46 kg/m?Marland Kitchen ?Performance status (ECOG): 1 ?Physical Exam ?Constitutional:   ?   General: She is not in acute distress. ?   Appearance: Normal appearance. She is normal weight. She is not ill-appearing.  ?HENT:  ?   Head: Normocephalic and atraumatic.  ?Eyes:  ?   General: No scleral icterus. ?   Extraocular Movements: Extraocular movements intact.  ?   Conjunctiva/sclera: Conjunctivae normal.  ?   Pupils: Pupils are equal, round, and reactive to light.  ?Cardiovascular:  ?   Rate and Rhythm: Regular rhythm. Tachycardia present.  ?   Pulses: Normal pulses.  ?   Heart sounds: Normal heart sounds. No murmur heard. ?  No friction rub. No gallop.  ?Pulmonary:  ?   Effort: Pulmonary effort is normal. No respiratory distress.  ?   Breath sounds: Normal breath sounds.  ?Abdominal:  ?   General: Bowel sounds are normal. There is no distension.  ?   Palpations: Abdomen is soft. There is no hepatomegaly, splenomegaly or mass.  ?   Tenderness: There is no abdominal tenderness.  ?Musculoskeletal:     ?   General: Normal range of motion.  ?   Cervical back: Normal range of motion and neck supple.  ?   Right lower leg: No edema.  ?   Left lower leg: No edema.  ?Lymphadenopathy:  ?   Cervical: No cervical adenopathy.  ?Skin: ?   General: Skin is warm and dry.  ?Neurological:  ?   General: No focal deficit present.  ?  Mental Status: She is alert and oriented to person, place, and time. Mental status is at baseline.  ?Psychiatric:     ?   Mood and Affect: Mood normal.     ?   Behavior: Behavior normal.     ?   Thought Content: Thought content normal.     ?   Judgment: Judgment normal.  ? ? ?LABS:  ? ? Latest Reference Range & Units 08/11/21 00:00  ?Sodium 137 - 147  138 (E)  ?Potassium 3.5 - 5.1 mEq/L 4.1 (E)  ?Chloride 99 - 108  108 (E)  ?CO2 13 - 22  22 (E)  ?Glucose  122 (E)  ?BUN 4 - 21  13 (E)  ?Creatinine 0.5 - 1.1  1.2 ! (E)  ?Calcium 8.7 - 10.7  8.7 (E)  ?Alkaline Phosphatase 25 - 125  104 (E)  ?Albumin 3.5 -  5.0  3.7 (E)  ?AST 13 - 35  33 (E)  ?ALT 7 - 35 U/L 35 (E)  ?Bilirubin, Total  0.5 (E)  ?!: Data is abnormal ?(E): External lab result ? Latest Reference Range & Units 08/11/21 00:00  ?WBC  3.1 (E)  ?RBC 3.87 - 5.11  3.86 ! (E)  ?Hemoglobin 12.0 - 16.0  12.3 (E)  ?HCT 36 - 46  38 (E)  ?Platelets 150 - 400 K/uL 119 ! (E)  ?NEUT#  1.61 (E)  ?!: Data is abnormal ?(E): External lab result ? ?ASSESSMENT & PLAN:  ?Assessment/Plan:  A 56 y.o. female with metastatic colon cancer.  Clinically, she appears to be doing fairly well.  She will proceed with her 26th cycle of FOLFIRI/Avastin this week, which is now being given every 3 weeks to give her an additional week to physically recuperate before heading into each new cycle of therapy.  Clinically, she is doing well.  I will see her back in 3 weeks before she heads into her 27th cycle of FOLFIRI/Avastin. The patient understands all the plans discussed today and is in agreement with them.   ? ?I, Rita Ohara, am acting as scribe for Marice Potter, MD   ? ?I have reviewed this report as typed by the medical scribe, and it is complete and accurate. ? ?Salathiel Ferrara Macarthur Critchley, MD ? ? ? ?  ?

## 2021-08-05 ENCOUNTER — Other Ambulatory Visit: Payer: Self-pay

## 2021-08-05 ENCOUNTER — Inpatient Hospital Stay (HOSPITAL_BASED_OUTPATIENT_CLINIC_OR_DEPARTMENT_OTHER): Payer: PRIVATE HEALTH INSURANCE | Admitting: Internal Medicine

## 2021-08-05 VITALS — BP 118/81 | HR 102 | Temp 97.7°F | Resp 17 | Ht 67.5 in | Wt 182.2 lb

## 2021-08-05 DIAGNOSIS — Z5111 Encounter for antineoplastic chemotherapy: Secondary | ICD-10-CM | POA: Diagnosis not present

## 2021-08-05 DIAGNOSIS — C7931 Secondary malignant neoplasm of brain: Secondary | ICD-10-CM | POA: Diagnosis not present

## 2021-08-05 NOTE — Progress Notes (Signed)
[facility] at [place], [place] [address] [phone]   Interval Evaluation  Date of Service: 08/05/21 Patient Name: Shannon [name] Patient MRN: [id number] Patient DOB: Aug 14, 1965 Provider: Ventura Sellers, MD  Identifying Statement:  Shannon [name] is a 56 y.o. female with Brain metastases [facility]) [C79.31]   Primary Cancer: Colon Adenocarcinoma, Stage IV  Oncologic History: 05/29/20: Sub-occipital craniotomy, resection by Dr. Saintclair Halsted.  Path c/w colon adenocarcinoma 07/08/20: Completes fractionated SRS to resection cavity w/ Dr. Isidore Moos 10/22/20: Salvage SRS R cerebellum  Interval History:  Shannon [name] presents today for follow up after recent MRI brain.  She denies any neurologic changes or new symptoms today.  Fatigue is still present. FOLFIRI now doing every 3 weeks.  Balance is still "not great" but still walking on her own, maintains full independence.  No other changes to medications.  H+P (07/02/20) Patient presented to medical attention at the end of December, 2021 with several weeks of progressive balance impairment.  She worsened toward the end to the point of needing full assist with walking, or being mostly bed-bound.  At the same time she developed bouts of vertigo, described as "room spinning around me", for which medical interventions were not effective.  CNS imaging demonstrated large enhancing mass in left cerebellar hemisphere with swelling and compression c/w likely brain metastasis; she underwent craniotomy and resection on 05/29/20 with Dr. Saintclair Halsted.  Following surgery, her symptoms improved considerably.  At present, she is functionally intact aside from issues with short term memory, fatigue, and mood.  Visited with radiation oncology today for post-operative simulation.   Medications: Current Outpatient Medications on File Prior to Visit  Medication Sig Dispense Refill   acetaminophen  (TYLENOL) 325 MG tablet Take 2 tablets (650 mg total) by mouth every 6 (six) hours as needed for mild pain.     atorvastatin (LIPITOR) 10 MG tablet Take by mouth.     CONTOUR NEXT TEST test strip 3 (three) times daily.     Dulaglutide (TRULICITY) 1.5 IO/0.3TD SOPN Inject into the skin.     fluvoxaMINE (LUVOX) 100 MG tablet Take 3 tablets (300 mg total) by mouth at bedtime. 270 tablet 0   hyoscyamine (LEVSIN SL) 0.125 MG SL tablet Place 1 tablet (0.125 mg total) under the tongue every 6 (six) hours as needed. 45 tablet 1   insulin aspart (NOVOLOG FLEXPEN) 100 UNIT/ML FlexPen Inject 8 Units into the skin 3 (three) times daily with meals. 15 mL 11   insulin glargine (LANTUS) 100 UNIT/ML Solostar Pen Inject 26 Units into the skin daily. 15 mL 11   lamoTRIgine (LAMICTAL) 150 MG tablet Take 1 tablet (150 mg total) by mouth daily. 90 tablet 3   levothyroxine (SYNTHROID) 75 MCG tablet Take 1 tablet (75 mcg total) by mouth daily. 30 tablet 0   loratadine (CLARITIN) 10 MG tablet Take 10 mg by mouth daily.     LORazepam (ATIVAN) 0.5 MG tablet Take 0.5 mg by mouth every 8 (eight) hours as needed for anxiety.     metFORMIN (GLUCOPHAGE) 500 MG tablet Take 1 tablet (500 mg total) by mouth 2 (two) times daily with a meal. 60 tablet 0   modafinil (PROVIGIL) 200 MG tablet TAKE 1 & 1/2 (ONE & ONE-HALF) TABLETS BY MOUTH ONCE DAILY 45 tablet 0   Multiple Vitamin (MULTI-VITAMIN) tablet Take 1 tablet by mouth daily.     NOVOFINE PEN NEEDLE 32G X 6 MM  MISC SMARTSIG:Syringe(s) SUB-Q     Omega-3 1000 MG CAPS Take by mouth.     ondansetron (ZOFRAN) 4 MG tablet Take 1 tablet (4 mg total) by mouth every 4 (four) hours as needed for nausea. 90 tablet 3   pantoprazole (PROTONIX) 40 MG tablet TAKE 1 TABLET BY MOUTH EVERYDAY AT BEDTIME 30 tablet 0   potassium chloride SA (KLOR-CON M) 20 MEQ tablet Take 1 tablet (20 mEq total) by mouth daily. 5 tablet 0   prochlorperazine (COMPAZINE) 10 MG tablet TAKE 1 TABLET BY MOUTH EVERY 6  HOURS AS NEEDED FOR NAUSEA OR VOMITING. 90 tablet 3   QUEtiapine (SEROQUEL) 100 MG tablet TAKE 1 TABLET BY MOUTH EVERYDAY AT BEDTIME 30 tablet 0   senna-docusate (SENOKOT-S) 8.6-50 MG tablet Take 2 tablets by mouth at bedtime.     VRAYLAR 3 MG capsule TAKE 1 CAPSULE BY MOUTH DAILY. 30 capsule 1   zolpidem (AMBIEN) 5 MG tablet Take 1 tablet (5 mg total) by mouth at bedtime as needed for sleep. 30 tablet 0   No current facility-administered medications on file prior to visit.    Allergies:  Allergies  Allergen Reactions   Clindamycin/Lincomycin Rash   Penicillins Rash    Reaction: 10 years   Past Medical History:  Past Medical History:  Diagnosis Date   Anemia    Iron De   Anxiety    Blood clot in vein    Bowel obstruction ([facility])    Colon cancer ([facility]) 2018   treated with surgery and chemotherapy   Depression    Diabetes mellitus without complication ([facility])    Type II   DVT (deep venous thrombosis) ([facility]) 2019   behind right knee- while she wasa on chemo   GERD (gastroesophageal reflux disease)    History of blood transfusion    History of chemotherapy    History of kidney stones 2012   passed   Hypothyroidism    Obesity    Past Surgical History:  Past Surgical History:  Procedure Laterality Date   ABDOMINAL HYSTERECTOMY  05/2019   total laparoscopin with tubes and ovariers removed also.   APPENDECTOMY  05/2019   COLON SURGERY  03/2017   Colon resection   COLONOSCOPY  2019   SUBOCCIPITAL CRANIECTOMY CERVICAL LAMINECTOMY Left 05/29/2020   Procedure: Suboccipital Craniectomy for Resection of Left Cerebellar Mass;  Surgeon: Kary Kos, MD;  Location: [facility];  Service: Neurosurgery;  Laterality: Left;   Social History:  Social History   Socioeconomic History   Marital status: Widowed    Spouse name: Not on file   Number of children: Not on file   Years of education: Not on file   Highest education level: Not on file  Occupational History   Occupation: care  coordinator   Tobacco Use   Smoking status: Never   Smokeless tobacco: Never  Vaping Use   Vaping Use: Never used  Substance and Sexual Activity   Alcohol use: Yes    Comment: rare   Drug use: Never   Sexual activity: Not Currently  Other Topics Concern   Not on file  Social History Narrative   Not on file   Social Determinants of Health   Financial Resource Strain: Not on file  Food Insecurity: Not on file  Transportation Needs: Not on file  Physical Activity: Not on file  Stress: Not on file  Social Connections: Not on file  Intimate Partner Violence: Not on file   Family History:  Family History  Problem Relation Age of Onset   Irritable bowel syndrome Mother    Colon polyps Father    Breast cancer Maternal Grandmother    Diabetes Maternal Grandmother    Diabetes Maternal Grandfather    Breast cancer Paternal Grandmother    Colon polyps Maternal Uncle    Stomach cancer Neg Hx    Pancreatic cancer Neg Hx    Esophageal cancer Neg Hx    Colon cancer Neg Hx     Review of Systems: Constitutional: +Fatigue Eyes: Doesn't report blurriness of vision Ears, nose, mouth, throat, and face: Doesn't report sore throat Respiratory: Doesn't report cough, dyspnea or wheezes Cardiovascular: Doesn't report palpitation, chest discomfort  Gastrointestinal:  Doesn't report nausea, constipation, diarrhea GU: Doesn't report incontinence Skin: Doesn't report skin rashes Neurological: Per HPI Musculoskeletal: Doesn't report joint pain Behavioral/Psych: +mood issues, depression  Physical Exam: Vitals:   08/05/21 0939  BP: 118/81  Pulse: (!) 102  Resp: 17  Temp: 97.7 F (36.5 C)  SpO2: 98%   KPS: 90. General: Alert, cooperative, pleasant, in no acute distress Head: Normal EENT: No conjunctival injection or scleral icterus.  Lungs: Resp effort normal Cardiac: Regular rate Abdomen: Non-distended abdomen Skin: No rashes cyanosis or petechiae. Extremities: No clubbing or  edema  Neurologic Exam: Mental Status: Awake, alert, attentive to examiner. Oriented to self and environment. Language is fluent with intact comprehension.  Cranial Nerves: Visual acuity is grossly normal. Visual fields are full. Extra-ocular movements intact. No ptosis. Face is symmetric Motor: Tone and bulk are normal. Power is full in both arms and legs. Reflexes are symmetric, no pathologic reflexes present.  Sensory: Intact to light touch Gait: Normal.   Labs: I have reviewed the data as listed    Component Value Date/Time   NA 138 07/18/2021 0000   K 3.5 07/18/2021 0000   CL 105 07/18/2021 0000   CO2 20 07/18/2021 0000   GLUCOSE 234 (H) 06/07/2020 0525   BUN 13 07/18/2021 0000   CREATININE 1.4 (A) 07/18/2021 0000   CREATININE 0.97 06/07/2020 0525   CALCIUM 9.0 07/18/2021 0000   PROT 5.2 (L) 06/07/2020 0525   ALBUMIN 4.1 07/18/2021 0000   AST 25 07/18/2021 0000   ALT 26 07/18/2021 0000   ALKPHOS 195 (A) 07/18/2021 0000   BILITOT 0.3 06/07/2020 0525   GFRNONAA >60 06/07/2020 0525   Lab Results  Component Value Date   WBC 7.2 07/18/2021   NEUTROABS 4.82 07/18/2021   HGB 12.6 07/18/2021   HCT 38 07/18/2021   MCV 101 (A) 07/07/2021   PLT 116 (A) 07/18/2021   Imaging:  [facility] Clinician Interpretation: I have personally reviewed the CNS images as listed.  My interpretation, in the context of the patient's clinical presentation, is stable disease  MR BRAIN W WO CONTRAST  Result Date: 08/01/2021 CLINICAL DATA:  Brain/CNS neoplasm, assess treatment response EXAM: MRI HEAD WITHOUT AND WITH CONTRAST TECHNIQUE: Multiplanar, multiecho pulse sequences of the brain and surrounding structures were obtained without and with intravenous contrast. CONTRAST:  71m MULTIHANCE GADOBENATE DIMEGLUMINE 529 MG/ML IV SOLN COMPARISON:  MRI 05/02/2021. FINDINGS: Brain: Similar postoperative changes in the left posterior fossa with encephalomalacia, gliosis and chronic blood products. Similar  contiguous fluid collection extending suboccipitally. Ill-defined enhancement at the operative site is unchanged. Unchanged right cerebellar lesion, measuring 4 mm on series 12, image 23. No new enhancing lesions identified. No acute infarct, acute hemorrhage, mass effect, midline shift or hydrocephalus. Vascular: Major arterial flow voids are maintained skull base. Skull and  upper cervical spine: Normal marrow signal. Sinuses/Orbits: Mild paranasal sinus mucosal thickening. Unremarkable orbits. Other: No mastoid effusions. IMPRESSION: 1. Stable postoperative change and small right cerebellar lesion. 2. No evidence of new or progressive metastatic disease. Electronically Signed   By: Margaretha Sheffield M.D.   On: 08/01/2021 13:10    Assessment/Plan Brain metastases [facility]) [C79.31]   Fenna Semel is clinically and radiographically stable today.    No change in neurologic status, recent labs WNL as well.  We appreciate the opportunity to participate in the care of Georgean Spainhower.  She will continue to follow with Dr. Bobby Rumpf for systemic therapy, currently FOLFIRI+avastin.  We ask that Katrenia Alkins return to clinic in 3 months with brain MRI, or sooner as needed.  All questions were answered. The patient knows to call the clinic with any problems, questions or concerns. No barriers to learning were detected.  The total time spent in the encounter was 30 minutes and more than 50% was on counseling and review of test results   Ventura Sellers, MD Medical Director of Neuro-Oncology [facility] 08/05/21 9:42 AM

## 2021-08-06 ENCOUNTER — Telehealth: Payer: Self-pay | Admitting: Internal Medicine

## 2021-08-06 ENCOUNTER — Encounter: Payer: PRIVATE HEALTH INSURANCE | Admitting: Psychology

## 2021-08-06 NOTE — Telephone Encounter (Signed)
Scheduled per 3/7 los, message has been left with pt ?

## 2021-08-07 ENCOUNTER — Encounter: Payer: Self-pay | Admitting: Psychiatry

## 2021-08-07 ENCOUNTER — Ambulatory Visit (INDEPENDENT_AMBULATORY_CARE_PROVIDER_SITE_OTHER): Payer: PRIVATE HEALTH INSURANCE | Admitting: Psychiatry

## 2021-08-07 ENCOUNTER — Other Ambulatory Visit: Payer: Self-pay

## 2021-08-07 DIAGNOSIS — R530 Neoplastic (malignant) related fatigue: Secondary | ICD-10-CM

## 2021-08-07 DIAGNOSIS — G3184 Mild cognitive impairment, so stated: Secondary | ICD-10-CM

## 2021-08-07 DIAGNOSIS — G4721 Circadian rhythm sleep disorder, delayed sleep phase type: Secondary | ICD-10-CM | POA: Diagnosis not present

## 2021-08-07 DIAGNOSIS — F333 Major depressive disorder, recurrent, severe with psychotic symptoms: Secondary | ICD-10-CM | POA: Diagnosis not present

## 2021-08-07 DIAGNOSIS — F422 Mixed obsessional thoughts and acts: Secondary | ICD-10-CM

## 2021-08-07 MED ORDER — ZOLPIDEM TARTRATE 5 MG PO TABS: 5.0000 mg | ORAL_TABLET | Freq: Every evening | ORAL | 0 refills | Status: DC | PRN

## 2021-08-07 MED ORDER — FLUVOXAMINE MALEATE 100 MG PO TABS: 300.0000 mg | ORAL_TABLET | Freq: Every day | ORAL | 0 refills | Status: DC

## 2021-08-07 MED ORDER — MODAFINIL 200 MG PO TABS: ORAL_TABLET | ORAL | 3 refills | Status: DC

## 2021-08-07 MED ORDER — QUETIAPINE FUMARATE 100 MG PO TABS: 200.0000 mg | ORAL_TABLET | Freq: Every day | ORAL | 2 refills | Status: DC

## 2021-08-07 NOTE — Patient Instructions (Signed)
Reduce Vraylar 3 mg every other day to see If depression energy and interest and enjoyment is better ? ?Reduce Seroquel to 150 for 2 weeks ?Then reduce to 100 mg for 2 weeks, ?Then reduce to 50 mg 2 weeks then stop if you can still sleep ok ?

## 2021-08-07 NOTE — Progress Notes (Signed)
Shannon [name] [id number] Jun 12, 1965 56 y.o.    Subjective:   Patient ID:  Shannon [name] is a 56 y.o. (DOB 1965-12-05) female.  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  seen November 2020 .  No meds were changed at that time.  10/16/19 the following is noted at this appt: Asked questions about sleep supplement.   Just started GI doctor.   Good overall. No mood swings. No depressive episodes.  Cry more with hormonal changes more easily.   Probably sleep too much to avoid.  Occ stress from work interferes with sleep.  Not as productive from home.  Hopes to get back to the office.  Still working from home.  Boss noticed her forgetfulness with deadlines and missing issues in emails.  Used to be sharper. Wonders if related to meds.  Can usually nap if desired for years and not seasonal.  Only anxiety is Covid related. More poor self care.   Hysterectomy December 2020. No med changes  03/18/2020 appointment with the following noted: July had Covid and both parents and her father died from [place]. Able to help mom with arrangements. Required one iron infusion this year. Never took NAC bc it was too hard to take. Sleep, eating schedule is irregular. More productive at home helps her feel better. Plan: No med changes. 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 For a month had dizzy, falls, NV all of which cleared quickly after removal.  Radiation to brain after this. More CA in lungs and abdomen and having chemo.  Oncologist said she has about 5 years of lifespan left.  MRI and CT pending. Gotten into therapy  starting [name] next week.   Pray a lot and good connection with friends.  Good support.  Trying to do things for fun.   Is working but finding it difficult mentally and physically DT SE fatigue from chemo. Considering LT disability but $ concerns.   If anxious trouble falling asleep.   Works from home.   D 54 yo.   Plan: No med changes except added modafinil  11/26/2020 appointment with the following noted: Added modafinil and didn't notice much from it. Sleep average about 7 hours.  Would prefer 10 hours and may nap here and there in week of chemo even on chemo.  Had to get it at [facility] with [facility]. No dx of OSA. Last 2 weeks a little low emotionally and mentally after up for a couple of weeks with company and it wore her out. Sleepiness is normally worse than tiredness. Only situational mood effects. Plan: No benefit 200 mg modafinil nor SE therefore Increase Modafinil to 300 mg daily for excessive sleepiness  04/21/2021 appointment with the following noted: Still dealing with chemotx for colon cancer Thinks increase modafinil questionable but misses it more than takes it. No SE. Things are getting harder rough month with more depression and anxiety related to cancer and daughter.  Work is hard.  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  Missing deadlines.   Needs to call social security. D 54 yo and in  therapy with [name].  So angry with patient. Has been able to stay on branded Seroquel. Tumors on CT scan  are shrinking recently. Plan: HX RELAPSE PSYCHOSIS WITH GENERIC QUETIAPINE, Continue branded Seroquel 800 mg HS and lamotrigine 150 mg daily and Luvox 300 mg daily Vraylar 1.5 mg daily for 2 weeks, Then if tolerated increase to 3 mg daily. If mood improves then reduce Seroquel to 1 and 1/2 tablets.  06/06/2021 appt noted: I like it.  More clear headed and better energy.  Missed a few bc out of samples. Reduced Seroquel 600 mg HS Less  depressed.  Especially right after starting and others noticed the benefit. Plan : Continue lamotrigine 150 mg daily and Luvox 300 mg daily Vraylar 3 mg daily. Reduce Seroquel to 400 mg nightly for 2 weeks,  Then reduce Seroquel to 200 mg nightly for 2 weeks,  Then reduce to 100 mg for 2 weeks, Then reduce to 50 mg 2 weeks then stop if you can still sleep ok  08/07/2021 appointment with the following noted: Continues Vraylar 3 mg and Luvox 300 mg daily.  Reduced Seroquel to 200 mg nightly and takes Ambien 5 mg nightly Went slowly down with Seroquel. Doing pretty good.  Some night hard to go to sleep but usally ok.   Still please with Vraylar less sedated. No mood swings.  Some struggle with depression with reduced motivation and enjoyment.  Sometimes bored and would rather go to bed.  Don't want to read or watch TV.   Can follow a show but doesn't find it intersting.  CT scan show tumors shrinking.   Patient denies difficulty with sleep maintenance.   Denies appetite disturbance.  Patient reports that energy and motivation have been good.  Patient denies any difficulty with concentration.  Patient denies any suicidal ideation.   Past Psychiatric Medication Trials: HX RELAPSE PSYCHOSIS WITH GENERIC QUETIAPINE,  Seroquel 800, Risperdal 2, inVega 3, Abilify 15, Vraylar 3 Citalopram, fluoxetine 80, sertraline to 50,  lamotrigine 200,  Has been under the care of the psychiatrist since March 2000  D seeing [name]  Review of Systems:  Review of Systems  Constitutional:  Positive for fatigue.  HENT:  Negative for rhinorrhea.   Eyes:  Negative for discharge.  Gastrointestinal:  Positive for constipation. Negative for abdominal distention.  Neurological:  Positive for weakness. Negative for tremors.  Psychiatric/Behavioral:  Positive for dysphoric mood.    Medications: I have reviewed the patient's current medications.  Current Outpatient Medications  Medication Sig Dispense Refill    acetaminophen (TYLENOL) 325 MG tablet Take 2 tablets (650 mg total) by mouth every 6 (six) hours as needed for mild pain.     atorvastatin (LIPITOR) 10 MG tablet Take by mouth.     CONTOUR NEXT TEST test strip 3 (three) times daily.     Dulaglutide (TRULICITY) 1.5 XB/3.5HG SOPN Inject into the skin.     hyoscyamine (LEVSIN SL) 0.125 MG SL tablet Place 1 tablet (0.125 mg total) under the tongue every 6 (six) hours as needed. 45 tablet 1   insulin aspart (NOVOLOG FLEXPEN) 100 UNIT/ML FlexPen Inject 8 Units into the skin 3 (three) times daily with meals. 15 mL 11   insulin glargine (LANTUS) 100 UNIT/ML Solostar Pen Inject 26 Units into the skin daily. 15 mL 11   lamoTRIgine (LAMICTAL) 150 MG tablet Take 1 tablet (150 mg total) by mouth daily. 90 tablet 3   levothyroxine (SYNTHROID) 75 MCG tablet Take 1 tablet (75  mcg total) by mouth daily. 30 tablet 0   loratadine (CLARITIN) 10 MG tablet Take 10 mg by mouth daily.     LORazepam (ATIVAN) 0.5 MG tablet Take 0.5 mg by mouth every 8 (eight) hours as needed for anxiety.     metFORMIN (GLUCOPHAGE) 500 MG tablet Take 1 tablet (500 mg total) by mouth 2 (two) times daily with a meal. 60 tablet 0   Multiple Vitamin (MULTI-VITAMIN) tablet Take 1 tablet by mouth daily.     NOVOFINE PEN NEEDLE 32G X 6 MM MISC SMARTSIG:Syringe(s) SUB-Q     Omega-3 1000 MG CAPS Take by mouth.     ondansetron (ZOFRAN) 4 MG tablet Take 1 tablet (4 mg total) by mouth every 4 (four) hours as needed for nausea. 90 tablet 3   pantoprazole (PROTONIX) 40 MG tablet TAKE 1 TABLET BY MOUTH EVERYDAY AT BEDTIME 30 tablet 0   prochlorperazine (COMPAZINE) 10 MG tablet TAKE 1 TABLET BY MOUTH EVERY 6 HOURS AS NEEDED FOR NAUSEA OR VOMITING. 90 tablet 3   VRAYLAR 3 MG capsule TAKE 1 CAPSULE BY MOUTH DAILY. 30 capsule 1   fluvoxaMINE (LUVOX) 100 MG tablet Take 3 tablets (300 mg total) by mouth at bedtime. 270 tablet 0   modafinil (PROVIGIL) 200 MG tablet TAKE 1 & 1/2 (ONE & ONE-HALF) TABLETS BY MOUTH  ONCE DAILY 45 tablet 3   potassium chloride SA (KLOR-CON M) 20 MEQ tablet Take 1 tablet (20 mEq total) by mouth daily. (Patient not taking: Reported on 08/05/2021) 5 tablet 0   QUEtiapine (SEROQUEL) 100 MG tablet Take 2 tablets (200 mg total) by mouth at bedtime. 60 tablet 2   senna-docusate (SENOKOT-S) 8.6-50 MG tablet Take 2 tablets by mouth at bedtime. (Patient not taking: Reported on 08/05/2021)     zolpidem (AMBIEN) 5 MG tablet Take 1 tablet (5 mg total) by mouth at bedtime as needed for sleep. 30 tablet 0   No current facility-administered medications for this visit.    Medication Side Effects: None  Allergies:  Allergies  Allergen Reactions   Clindamycin/Lincomycin Rash   Penicillins Rash    Reaction: 10 years    Past Medical History:  Diagnosis Date   Anemia    Iron De   Anxiety    Blood clot in vein    Bowel obstruction ([place])    Colon cancer ([place]) 2018   treated with surgery and chemotherapy   Depression    Diabetes mellitus without complication ([place])    Type II   DVT (deep venous thrombosis) ([place]) 2019   behind right knee- while she wasa on chemo   GERD (gastroesophageal reflux disease)    History of blood transfusion    History of chemotherapy    History of kidney stones 2012   passed   Hypothyroidism    Obesity     Family History  Problem Relation Age of Onset   Irritable bowel syndrome Mother    Colon polyps Father    Breast cancer Maternal Grandmother    Diabetes Maternal Grandmother    Diabetes Maternal Grandfather    Breast cancer Paternal Grandmother    Colon polyps Maternal Uncle    Stomach cancer Neg Hx    Pancreatic cancer Neg Hx    Esophageal cancer Neg Hx    Colon cancer Neg Hx     Social History   Socioeconomic History   Marital status: Widowed    Spouse name: Not on file   Number of children: Not on file  Years of education: Not on file   Highest education level: Not on file  Occupational History   Occupation: care coordinator    Tobacco Use   Smoking status: Never   Smokeless tobacco: Never  Vaping Use   Vaping Use: Never used  Substance and Sexual Activity   Alcohol use: Yes    Comment: rare   Drug use: Never   Sexual activity: Not Currently  Other Topics Concern   Not on file  Social History Narrative   Not on file   Social Determinants of Health   Financial Resource Strain: Not on file  Food Insecurity: Not on file  Transportation Needs: Not on file  Physical Activity: Not on file  Stress: Not on file  Social Connections: Not on file  Intimate Partner Violence: Not on file    Past Medical History, Surgical history, Social history, and Family history were reviewed and updated as appropriate.   Please see review of systems for further details on the patient's review from today.   Objective:   Physical Exam:  LMP 02/22/2019 (Approximate)   Physical Exam Constitutional:      General: She is not in acute distress. Musculoskeletal:        General: No deformity.  Neurological:     Mental Status: She is alert and oriented to person, place, and time.     Coordination: Coordination normal.  Psychiatric:        Attention and Perception: Attention and perception normal. She does not perceive auditory or visual hallucinations.        Mood and Affect: Mood is anxious and depressed. Affect is not labile, blunt, angry or inappropriate.        Speech: Speech normal.        Behavior: Behavior normal.        Thought Content: Thought content normal. Thought content is not paranoid or delusional. Thought content does not include homicidal or suicidal ideation.        Cognition and Memory: Cognition and memory normal.        Judgment: Judgment normal.     Comments: Insight intact Still some anhedonia and depression    Lab Review:     Component Value Date/Time   NA 138 07/18/2021 0000   K 3.5 07/18/2021 0000   CL 105 07/18/2021 0000   CO2 20 07/18/2021 0000   GLUCOSE 234 (H) 06/07/2020 0525   BUN  13 07/18/2021 0000   CREATININE 1.4 (A) 07/18/2021 0000   CREATININE 0.97 06/07/2020 0525   CALCIUM 9.0 07/18/2021 0000   PROT 5.2 (L) 06/07/2020 0525   ALBUMIN 4.1 07/18/2021 0000   AST 25 07/18/2021 0000   ALT 26 07/18/2021 0000   ALKPHOS 195 (A) 07/18/2021 0000   BILITOT 0.3 06/07/2020 0525   GFRNONAA >60 06/07/2020 0525       Component Value Date/Time   WBC 7.2 07/18/2021 0000   WBC 4.8 06/07/2020 0525   RBC 3.75 (A) 07/18/2021 0000   HGB 12.6 07/18/2021 0000   HCT 38 07/18/2021 0000   PLT 116 (A) 07/18/2021 0000   MCV 101 (A) 07/07/2021 0000   MCH 32.6 09/16/2020 0000   MCHC 33 09/16/2020 0000   RDW 13.5 06/07/2020 0525   LYMPHSABS 1.0 06/07/2020 0525   MONOABS 0.4 06/07/2020 0525   EOSABS 0.0 06/07/2020 0525   BASOSABS 0.0 06/07/2020 0525    No results found for: POCLITH, LITHIUM   No results found for: PHENYTOIN, PHENOBARB, VALPROATE, CBMZ   .  res Assessment: Plan:    Delayed sleep phase syndrome - Plan: zolpidem (AMBIEN) 5 MG tablet  Severe recurrent major depression with psychotic features ([place]) - Plan: modafinil (PROVIGIL) 200 MG tablet, QUEtiapine (SEROQUEL) 100 MG tablet  Mild cognitive impairment - Plan: modafinil (PROVIGIL) 200 MG tablet  Neoplastic malignant related fatigue - Plan: modafinil (PROVIGIL) 200 MG tablet  Mixed obsessional thoughts and acts - Plan: QUEtiapine (SEROQUEL) 100 MG tablet, fluvoxaMINE (LUVOX) 100 MG tablet   Overall mood and anxiety are less well managed.  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 In general has handled this well except some trouble going to sleep on occ with anxiety.  However more recently she has become less depressed with Vraylar 3 mg and wants to switch off Seroquel.  Try to avoid Xanax if possible BC DDI with Luvox. Ambien 5 prn.  Disc amnesia risk.  No benefit 200 mg modafinil nor SE therefore OK continue trial  Modafinil to 300 mg daily for  excessive sleepiness  Discussed potential metabolic side effects associated with atypical antipsychotics, as well as potential risk for movement side effects. Advised pt to contact office if movement side effects occur.   Counseled patient regarding potential benefits, risks, and side effects of Lamictal to include potential risk of Stevens-Johnson syndrome. Advised patient to stop taking Lamictal and contact office immediately if rash develops and to seek urgent medical attention if rash is severe and/or spreading quickly.  HX RELAPSE PSYCHOSIS WITH GENERIC QUETIAPINE, Continue lamotrigine 150 mg daily and Luvox 300 mg daily  Reduce Vraylar 3 mg every other day to see If depression energy and interest and enjoyment is better  Reduce Seroquel to 150 for 2 weeks Then reduce to 100 mg for 2 weeks, Then reduce to 50 mg 2 weeks then stop if you can still sleep ok Wants to have access to Ambien  Disc insomnia is frequent problem trying to wean off Seroquel.  FU 8 weeks  Lynder Parents, MD, DFAPA   Please see After Visit Summary for patient specific instructions.  Future Appointments  Date Time Provider [place]  08/11/2021 10:15 AM [facility] None  08/11/2021 10:30 AM Lewis, Dequincy A, MD [facility] None  08/12/2021 10:00 AM [facility] INFUSION CHAIR 3 [facility] None  08/14/2021  1:00 PM [facility] INFUSION CHAIR 7 [facility] None  09/01/2021 10:00 AM [facility] INFUSION CHAIR 2 [facility] None  09/03/2021  1:30 PM [facility] INFUSION CHAIR 6 [facility] None  09/08/2021  4:00 PM Edgardo Roys, PsyD [facility] CPR  10/13/2021  4:00 PM Edgardo Roys, PsyD [facility] CPR  11/04/2021  9:30 AM Vaslow, Acey Lav, MD [facility] None    No orders of the defined types were placed in this encounter.      -------------------------------

## 2021-08-08 MED FILL — Fluorouracil IV Soln 5 GM/100ML (50 MG/ML): INTRAVENOUS | Qty: 100 | Status: AC

## 2021-08-08 MED FILL — Bevacizumab-bvzr IV Soln 400 MG/16ML (For Infusion): INTRAVENOUS | Qty: 16 | Status: AC

## 2021-08-08 MED FILL — Fluorouracil IV Soln 2.5 GM/50ML (50 MG/ML): INTRAVENOUS | Qty: 17 | Status: AC

## 2021-08-08 MED FILL — Leucovorin Calcium For Inj 350 MG: INTRAMUSCULAR | Qty: 41.8 | Status: AC

## 2021-08-08 MED FILL — Irinotecan HCl Inj 100 MG/5ML (20 MG/ML): INTRAVENOUS | Qty: 14 | Status: AC

## 2021-08-11 ENCOUNTER — Other Ambulatory Visit: Payer: Self-pay | Admitting: Hematology and Oncology

## 2021-08-11 ENCOUNTER — Inpatient Hospital Stay (INDEPENDENT_AMBULATORY_CARE_PROVIDER_SITE_OTHER): Payer: PRIVATE HEALTH INSURANCE | Admitting: Oncology

## 2021-08-11 ENCOUNTER — Inpatient Hospital Stay: Payer: PRIVATE HEALTH INSURANCE

## 2021-08-11 ENCOUNTER — Other Ambulatory Visit: Payer: Self-pay

## 2021-08-11 VITALS — BP 120/79 | HR 95 | Temp 98.1°F | Resp 16 | Ht 68.0 in | Wt 180.6 lb

## 2021-08-11 DIAGNOSIS — C182 Malignant neoplasm of ascending colon: Secondary | ICD-10-CM | POA: Diagnosis not present

## 2021-08-11 DIAGNOSIS — C189 Malignant neoplasm of colon, unspecified: Secondary | ICD-10-CM

## 2021-08-11 LAB — BASIC METABOLIC PANEL
BUN: 13 (ref 4–21)
CO2: 22 (ref 13–22)
Chloride: 108 (ref 99–108)
Creatinine: 1.2 — AB (ref 0.5–1.1)
Glucose: 122
Potassium: 4.1 mEq/L (ref 3.5–5.1)
Sodium: 138 (ref 137–147)

## 2021-08-11 LAB — CBC: RBC: 3.86 — AB (ref 3.87–5.11)

## 2021-08-11 LAB — CBC AND DIFFERENTIAL
HCT: 38 (ref 36–46)
Hemoglobin: 12.3 (ref 12.0–16.0)
Neutrophils Absolute: 1.61
Platelets: 119 10*3/uL — AB (ref 150–400)
WBC: 3.1

## 2021-08-11 LAB — COMPREHENSIVE METABOLIC PANEL
Albumin: 3.7 (ref 3.5–5.0)
Calcium: 8.7 (ref 8.7–10.7)

## 2021-08-11 LAB — LIPID PANEL
Cholesterol: 217 — AB (ref 0–200)
HDL: 45 (ref 35–70)
LDL Cholesterol: 111
LDl/HDL Ratio: 4.8
Triglycerides: 307 — AB (ref 40–160)

## 2021-08-11 LAB — HEPATIC FUNCTION PANEL
ALT: 35 U/L (ref 7–35)
AST: 33 (ref 13–35)
Alkaline Phosphatase: 104 (ref 25–125)
Bilirubin, Total: 0.5

## 2021-08-12 ENCOUNTER — Inpatient Hospital Stay: Payer: PRIVATE HEALTH INSURANCE

## 2021-08-12 VITALS — BP 138/83 | HR 98 | Temp 97.7°F | Resp 18 | Ht 68.0 in | Wt 182.1 lb

## 2021-08-12 DIAGNOSIS — C182 Malignant neoplasm of ascending colon: Secondary | ICD-10-CM

## 2021-08-12 DIAGNOSIS — Z5111 Encounter for antineoplastic chemotherapy: Secondary | ICD-10-CM | POA: Diagnosis not present

## 2021-08-12 MED ORDER — SODIUM CHLORIDE 0.9 % IV SOLN: Freq: Once | INTRAVENOUS | Status: AC

## 2021-08-12 MED ORDER — HEPARIN SOD (PORK) LOCK FLUSH 100 UNIT/ML IV SOLN: 500.0000 [IU] | Freq: Once | INTRAVENOUS | Status: DC | PRN

## 2021-08-12 MED ORDER — SODIUM CHLORIDE 0.9 % IV SOLN
2400.0000 mg/m2 | INTRAVENOUS | Status: DC
  Administered 2021-08-12: 5000 mg via INTRAVENOUS
  Filled 2021-08-12: qty 100

## 2021-08-12 MED ORDER — ATROPINE SULFATE 1 MG/ML IV SOLN
0.5000 mg | Freq: Once | INTRAVENOUS | Status: AC | PRN
  Administered 2021-08-12: 0.5 mg via INTRAVENOUS
  Filled 2021-08-12: qty 1

## 2021-08-12 MED ORDER — SODIUM CHLORIDE 0.9 % IV SOLN
150.0000 mg | Freq: Once | INTRAVENOUS | Status: AC
  Administered 2021-08-12: 150 mg via INTRAVENOUS
  Filled 2021-08-12: qty 150

## 2021-08-12 MED ORDER — FLUOROURACIL CHEMO INJECTION 2.5 GM/50ML
400.0000 mg/m2 | Freq: Once | INTRAVENOUS | Status: AC
  Administered 2021-08-12: 850 mg via INTRAVENOUS
  Filled 2021-08-12: qty 17

## 2021-08-12 MED ORDER — SODIUM CHLORIDE 0.9 % IV SOLN
10.0000 mg | Freq: Once | INTRAVENOUS | Status: AC
  Administered 2021-08-12: 10 mg via INTRAVENOUS
  Filled 2021-08-12: qty 10

## 2021-08-12 MED ORDER — SODIUM CHLORIDE 0.9% FLUSH: 10.0000 mL | INTRAVENOUS | Status: DC | PRN

## 2021-08-12 MED ORDER — PALONOSETRON HCL INJECTION 0.25 MG/5ML
0.2500 mg | Freq: Once | INTRAVENOUS | Status: AC
  Administered 2021-08-12: 0.25 mg via INTRAVENOUS
  Filled 2021-08-12: qty 5

## 2021-08-12 MED ORDER — LEUCOVORIN CALCIUM INJECTION 350 MG
400.0000 mg/m2 | Freq: Once | INTRAVENOUS | Status: AC
  Administered 2021-08-12: 836 mg via INTRAVENOUS
  Filled 2021-08-12: qty 41.8

## 2021-08-12 MED ORDER — IRINOTECAN HCL CHEMO INJECTION 100 MG/5ML
135.0000 mg/m2 | Freq: Once | INTRAVENOUS | Status: AC
  Administered 2021-08-12: 280 mg via INTRAVENOUS
  Filled 2021-08-12: qty 14

## 2021-08-12 MED ORDER — SODIUM CHLORIDE 0.9 % IV SOLN
5.0000 mg/kg | Freq: Once | INTRAVENOUS | Status: AC
  Administered 2021-08-12: 400 mg via INTRAVENOUS
  Filled 2021-08-12: qty 16

## 2021-08-12 NOTE — Progress Notes (Signed)
1338 2 RNs Verified: Drug appearance and integrity are intact; Drug expiration date and time - drug has not expired; Pump settings are accurate, if applicable. Second nurse is Ermelinda Das RN  ?

## 2021-08-12 NOTE — Patient Instructions (Signed)
Irinotecan injection ?What is this medication? ?IRINOTECAN (ir in oh TEE kan ) is a chemotherapy drug. It is used to treat colon and rectal cancer. ?This medicine may be used for other purposes; ask your health care provider or pharmacist if you have questions. ?COMMON BRAND NAME(S): Camptosar ?What should I tell my care team before I take this medication? ?They need to know if you have any of these conditions: ?dehydration ?diarrhea ?infection (especially a virus infection such as chickenpox, cold sores, or herpes) ?liver disease ?low blood counts, like low white cell, platelet, or red cell counts ?low levels of calcium, magnesium, or potassium in the blood ?recent or ongoing radiation therapy ?an unusual or allergic reaction to irinotecan, other medicines, foods, dyes, or preservatives ?pregnant or trying to get pregnant ?breast-feeding ?How should I use this medication? ?This drug is given as an infusion into a vein. It is administered in a hospital or clinic by a specially trained health care professional. ?Talk to your pediatrician regarding the use of this medicine in children. Special care may be needed. ?Overdosage: If you think you have taken too much of this medicine contact a poison control center or emergency room at once. ?NOTE: This medicine is only for you. Do not share this medicine with others. ?What if I miss a dose? ?It is important not to miss your dose. Call your doctor or health care professional if you are unable to keep an appointment. ?What may interact with this medication? ?Do not take this medicine with any of the following medications: ?cobicistat ?itraconazole ?This medicine may interact with the following medications: ?antiviral medicines for HIV or AIDS ?certain antibiotics like rifampin or rifabutin ?certain medicines for fungal infections like ketoconazole, posaconazole, and voriconazole ?certain medicines for seizures like carbamazepine, phenobarbital,  phenotoin ?clarithromycin ?gemfibrozil ?nefazodone ?St. John's Wort ?This list may not describe all possible interactions. Give your health care provider a list of all the medicines, herbs, non-prescription drugs, or dietary supplements you use. Also tell them if you smoke, drink alcohol, or use illegal drugs. Some items may interact with your medicine. ?What should I watch for while using this medication? ?Your condition will be monitored carefully while you are receiving this medicine. You will need important blood work done while you are taking this medicine. ?This drug may make you feel generally unwell. This is not uncommon, as chemotherapy can affect healthy cells as well as cancer cells. Report any side effects. Continue your course of treatment even though you feel ill unless your doctor tells you to stop. ?In some cases, you may be given additional medicines to help with side effects. Follow all directions for their use. ?You may get drowsy or dizzy. Do not drive, use machinery, or do anything that needs mental alertness until you know how this medicine affects you. Do not stand or sit up quickly, especially if you are an older patient. This reduces the risk of dizzy or fainting spells. ?Call your health care professional for advice if you get a fever, chills, or sore throat, or other symptoms of a cold or flu. Do not treat yourself. This medicine decreases your body's ability to fight infections. Try to avoid being around people who are sick. ?Avoid taking products that contain aspirin, acetaminophen, ibuprofen, naproxen, or ketoprofen unless instructed by your doctor. These medicines may hide a fever. ?This medicine may increase your risk to bruise or bleed. Call your doctor or health care professional if you notice any unusual bleeding. ?Be careful brushing and   flossing your teeth or using a toothpick because you may get an infection or bleed more easily. If you have any dental work done, tell your  dentist you are receiving this medicine. ?Do not become pregnant while taking this medicine or for 6 months after stopping it. Women should inform their health care professional if they wish to become pregnant or think they might be pregnant. Men should not father a child while taking this medicine and for 3 months after stopping it. There is potential for serious side effects to an unborn child. Talk to your health care professional for more information. ?Do not breast-feed an infant while taking this medicine or for 7 days after stopping it. ?This medicine has caused ovarian failure in some women. This medicine may make it more difficult to get pregnant. Talk to your health care professional if you are concerned about your fertility. ?This medicine has caused decreased sperm counts in some men. This may make it more difficult to father a child. Talk to your health care professional if you are concerned about your fertility. ?What side effects may I notice from receiving this medication? ?Side effects that you should report to your doctor or health care professional as soon as possible: ?allergic reactions like skin rash, itching or hives, swelling of the face, lips, or tongue ?chest pain ?diarrhea ?flushing, runny nose, sweating during infusion ?low blood counts - this medicine may decrease the number of white blood cells, red blood cells and platelets. You may be at increased risk for infections and bleeding. ?nausea, vomiting ?pain, swelling, warmth in the leg ?signs of decreased platelets or bleeding - bruising, pinpoint red spots on the skin, black, tarry stools, blood in the urine ?signs of infection - fever or chills, cough, sore throat, pain or difficulty passing urine ?signs of decreased red blood cells - unusually weak or tired, fainting spells, lightheadedness ?Side effects that usually do not require medical attention (report to your doctor or health care professional if they continue or are  bothersome): ?constipation ?hair loss ?headache ?loss of appetite ?mouth sores ?stomach pain ?This list may not describe all possible side effects. Call your doctor for medical advice about side effects. You may report side effects to FDA at 1-800-FDA-1088. ?Where should I keep my medication? ?This drug is given in a hospital or clinic and will not be stored at home. ?NOTE: This sheet is a summary. It may not cover all possible information. If you have questions about this medicine, talk to your doctor, pharmacist, or health care provider. ?? 2022 Elsevier/Gold Standard (2021-02-04 00:00:00) ?Leucovorin injection ?What is this medication? ?LEUCOVORIN (loo koe VOR in) is used to prevent or treat the harmful effects of some medicines. This medicine is used to treat anemia caused by a low amount of folic acid in the body. It is also used with 5-fluorouracil (5-FU) to treat colon cancer. ?This medicine may be used for other purposes; ask your health care provider or pharmacist if you have questions. ?What should I tell my care team before I take this medication? ?They need to know if you have any of these conditions: ?anemia from low levels of vitamin B-12 in the blood ?an unusual or allergic reaction to leucovorin, folic acid, other medicines, foods, dyes, or preservatives ?pregnant or trying to get pregnant ?breast-feeding ?How should I use this medication? ?This medicine is for injection into a muscle or into a vein. It is given by a health care professional in a hospital or clinic setting. ?  Talk to your pediatrician regarding the use of this medicine in children. Special care may be needed. ?Overdosage: If you think you have taken too much of this medicine contact a poison control center or emergency room at once. ?NOTE: This medicine is only for you. Do not share this medicine with others. ?What if I miss a dose? ?This does not apply. ?What may interact with this  medication? ?capecitabine ?fluorouracil ?phenobarbital ?phenytoin ?primidone ?trimethoprim-sulfamethoxazole ?This list may not describe all possible interactions. Give your health care provider a list of all the medicines, herbs, non-prescription drugs, or dietary supplements you use. Also tell t

## 2021-08-14 ENCOUNTER — Inpatient Hospital Stay: Payer: PRIVATE HEALTH INSURANCE

## 2021-08-14 ENCOUNTER — Other Ambulatory Visit: Payer: Self-pay

## 2021-08-14 VITALS — BP 98/75 | HR 108 | Temp 98.3°F | Resp 18 | Wt 182.0 lb

## 2021-08-14 DIAGNOSIS — Z5111 Encounter for antineoplastic chemotherapy: Secondary | ICD-10-CM | POA: Diagnosis not present

## 2021-08-14 MED ORDER — SODIUM CHLORIDE 0.9% FLUSH
10.0000 mL | INTRAVENOUS | Status: DC | PRN
  Administered 2021-08-14: 10 mL

## 2021-08-14 MED ORDER — HEPARIN SOD (PORK) LOCK FLUSH 100 UNIT/ML IV SOLN
500.0000 [IU] | Freq: Once | INTRAVENOUS | Status: AC | PRN
  Administered 2021-08-14: 500 [IU]

## 2021-08-14 NOTE — Progress Notes (Signed)
1329:PT STABLE AT TIME OF DISCHARGE ?

## 2021-08-14 NOTE — Patient Instructions (Signed)
[facility]  Discharge Instructions: Thank you for choosing [facility] to provide your oncology and hematology care.  If you have a lab appointment with the Cancer Center, please go directly to the Cancer Center and check in at the registration area.   Wear comfortable clothing and clothing appropriate for easy access to any Portacath or PICC line.   We strive to give you quality time with your provider. You may need to reschedule your appointment if you arrive late (15 or more minutes).  Arriving late affects you and other patients whose appointments are after yours.  Also, if you miss three or more appointments without notifying the office, you may be dismissed from the clinic at the provider's discretion.      For prescription refill requests, have your pharmacy contact our office and allow 72 hours for refills to be completed.    Today you received the following chemotherapy and/or immunotherapy agents Fluorouracil    To help prevent nausea and vomiting after your treatment, we encourage you to take your nausea medication as directed.  BELOW ARE SYMPTOMS THAT SHOULD BE REPORTED IMMEDIATELY: *FEVER GREATER THAN 100.4 F (38 C) OR HIGHER *CHILLS OR SWEATING *NAUSEA AND VOMITING THAT IS NOT CONTROLLED WITH YOUR NAUSEA MEDICATION *UNUSUAL SHORTNESS OF BREATH *UNUSUAL BRUISING OR BLEEDING *URINARY PROBLEMS (pain or burning when urinating, or frequent urination) *BOWEL PROBLEMS (unusual diarrhea, constipation, pain near the anus) TENDERNESS IN MOUTH AND THROAT WITH OR WITHOUT PRESENCE OF ULCERS (sore throat, sores in mouth, or a toothache) UNUSUAL RASH, SWELLING OR PAIN  UNUSUAL VAGINAL DISCHARGE OR ITCHING   Items with * indicate a potential emergency and should be followed up as soon as possible or go to the Emergency Department if any problems should occur.  Please show the CHEMOTHERAPY ALERT CARD or IMMUNOTHERAPY ALERT CARD at check-in to the  Emergency Department and triage nurse.  Should you have questions after your visit or need to cancel or reschedule your appointment, please contact [facility]  Dept: [phone]  and follow the prompts.  Office hours are 8:00 a.m. to 4:30 p.m. Monday - Friday. Please note that voicemails left after 4:00 p.m. may not be returned until the following business day.  We are closed weekends and major holidays. You have access to a nurse at all times for urgent questions. Please call the main number to the clinic Dept: [phone] and follow the prompts.  For any non-urgent questions, you may also contact your provider using MyChart. We now offer e-Visits for anyone 18 and older to request care online for non-urgent symptoms. For details visit [email or web address].   Also download the MyChart app! Go to the app store, search "MyChart", open the app, select [facility], and log in with your MyChart username and password.  Due to Covid, a mask is required upon entering the hospital/clinic. If you do not have a mask, one will be given to you upon arrival. For doctor visits, patients may have 1 support person aged 18 or older with them. For treatment visits, patients cannot have anyone with them due to current Covid guidelines and our immunocompromised population.    

## 2021-08-28 NOTE — Progress Notes (Signed)
?[name]  ?[address] ?[place],  [place]  [address] ?[phone] ? ?Clinic Day:  08/29/2021 ? ?Referring physician: Greig Right, MD ? ?HISTORY OF PRESENT ILLNESS:  ?The patient is a 56 y.o. female with metastatic colon cancer, which includes spread of disease to her abdominal cavity, lungs, brain and right adrenal gland. She comes in today prior to her 27th cycle of palliative FOLFIRI/Avastin chemotherapy.  The patient claims to have tolerated her 26th cycle of treatment fairly well.  She has intermittent diarrhea and constipation over these past weeks, but the symptoms have not caused her any significant alteration in her daily quality of life.  She also denies having any new symptoms or findings which concern her overt signs of disease progression.  She definitely likes her chemotherapy being every 3 weeks, particularly as it gives her an extra week to recuperate after each cycle.   ?  ?With respect to her colon cancer history, she is status post a right hemicolectomy in early October 2018, followed by 12 cycles of adjuvant FOLFOX chemotherapy, which were completed in April 2019.  In December 2021, she underwent a left cerebellar metastatectomy, whose pathology was consistent with metastatic colon cancer.  Tumor testing did come back MMR normal.  CT scans recently showed evidence of her cancer being in multiple locations, which has her on palliative chemotherapy at this time.   ? ?PHYSICAL EXAM:  ?Blood pressure 107/78, pulse (!) 109, temperature 98 ?F (36.7 ?C), resp. rate 14, height _0  (1.727 m), weight 181 lb 11.2 oz (82.4 kg), last menstrual period 02/22/2019, SpO2 95 %. ?Wt Readings from Last 3 Encounters:  ?09/03/21 181 lb (82.1 kg)  ?09/01/21 183 lb 0.6 oz (83 kg)  ?08/29/21 181 lb 11.2 oz (82.4 kg)  ? ?Body mass index is 27.63 kg/m?Marland Kitchen ?Performance status (ECOG): 1 ?Physical Exam ?Constitutional:   ?   General: She is not in acute distress. ?   Appearance:  Normal appearance. She is normal weight. She is not ill-appearing.  ?HENT:  ?   Head: Normocephalic and atraumatic.  ?Eyes:  ?   General: No scleral icterus. ?   Extraocular Movements: Extraocular movements intact.  ?   Conjunctiva/sclera: Conjunctivae normal.  ?   Pupils: Pupils are equal, round, and reactive to light.  ?Cardiovascular:  ?   Rate and Rhythm: Regular rhythm. Tachycardia present.  ?   Pulses: Normal pulses.  ?   Heart sounds: Normal heart sounds. No murmur heard. ?  No friction rub. No gallop.  ?Pulmonary:  ?   Effort: Pulmonary effort is normal. No respiratory distress.  ?   Breath sounds: Normal breath sounds.  ?Abdominal:  ?   General: Bowel sounds are normal. There is no distension.  ?   Palpations: Abdomen is soft. There is no hepatomegaly, splenomegaly or mass.  ?   Tenderness: There is no abdominal tenderness.  ?Musculoskeletal:     ?   General: Normal range of motion.  ?   Cervical back: Normal range of motion and neck supple.  ?   Right lower leg: No edema.  ?   Left lower leg: No edema.  ?Lymphadenopathy:  ?   Cervical: No cervical adenopathy.  ?Skin: ?   General: Skin is warm and dry.  ?Neurological:  ?   General: No focal deficit present.  ?   Mental Status: She is alert and oriented to person, place, and time. Mental status is at baseline.  ?Psychiatric:     ?  Mood and Affect: Mood normal.     ?   Behavior: Behavior normal.     ?   Thought Content: Thought content normal.     ?   Judgment: Judgment normal.  ? ? ?LABS:  ? ? Latest Reference Range & Units 08/29/21 00:00  ?Sodium 137 - 147  138 (E)  ?Potassium 3.5 - 5.1 mEq/L 3.6 (E)  ?Chloride 99 - 108  106 (E)  ?CO2 13 - 22  24 ! (E)  ?Glucose  180 (E)  ?BUN 4 - 21  13 (E)  ?Creatinine 0.5 - 1.1  1.2 ! (E)  ?Calcium 8.7 - 10.7  9.1 (E)  ?Alkaline Phosphatase 25 - 125  131 ! (E)  ?Albumin 3.5 - 5.0  4.1 (E)  ?AST 13 - 35  32 (E)  ?ALT 7 - 35 U/L 28 (E)  ?Bilirubin, Total  0.6 (E)  ?WBC  2.9 (E)  ?RBC 3.87 - 5.11  4.04 (E)  ?Hemoglobin  12.0 - 16.0  12.8 (E)  ?HCT 36 - 46  40 (E)  ?Platelets 150 - 400 K/uL 99 ! (E)  ?NEUT#  1.31 (E)  ?!: Data is abnormal ?(E): External lab result ? ?ASSESSMENT & PLAN:  ?Assessment/Plan:  A 56 y.o. female with metastatic colon cancer.  She will proceed with her 27th cycle of FOLFIRI/Avastin next week, which is now being given every 3 weeks to give her an additional week to physically recuperate before heading into each new cycle of therapy.  Clinically, she is doing well.  I will see her back in 3 weeks before she heads into her 28th cycle of FOLFIRI/Avastin. The patient understands all the plans discussed today and is in agreement with them.   ? ? ?Talen Poser Macarthur Critchley, MD ? ? ? ?  ?

## 2021-08-29 ENCOUNTER — Inpatient Hospital Stay (INDEPENDENT_AMBULATORY_CARE_PROVIDER_SITE_OTHER): Payer: PRIVATE HEALTH INSURANCE | Admitting: Oncology

## 2021-08-29 ENCOUNTER — Inpatient Hospital Stay: Payer: PRIVATE HEALTH INSURANCE

## 2021-08-29 ENCOUNTER — Other Ambulatory Visit: Payer: Self-pay | Admitting: Psychiatry

## 2021-08-29 ENCOUNTER — Encounter: Payer: Self-pay | Admitting: Oncology

## 2021-08-29 VITALS — BP 107/78 | HR 109 | Temp 98.0°F | Resp 14 | Ht 68.0 in | Wt 181.7 lb

## 2021-08-29 DIAGNOSIS — C182 Malignant neoplasm of ascending colon: Secondary | ICD-10-CM | POA: Diagnosis not present

## 2021-08-29 DIAGNOSIS — C189 Malignant neoplasm of colon, unspecified: Secondary | ICD-10-CM

## 2021-08-29 DIAGNOSIS — F422 Mixed obsessional thoughts and acts: Secondary | ICD-10-CM

## 2021-08-29 DIAGNOSIS — F333 Major depressive disorder, recurrent, severe with psychotic symptoms: Secondary | ICD-10-CM

## 2021-08-29 LAB — COMPREHENSIVE METABOLIC PANEL
Albumin: 4.1 (ref 3.5–5.0)
Calcium: 9.1 (ref 8.7–10.7)

## 2021-08-29 LAB — HEPATIC FUNCTION PANEL
ALT: 28 U/L (ref 7–35)
AST: 32 (ref 13–35)
Alkaline Phosphatase: 131 — AB (ref 25–125)
Bilirubin, Total: 0.6

## 2021-08-29 LAB — BASIC METABOLIC PANEL
BUN: 13 (ref 4–21)
CO2: 24 — AB (ref 13–22)
Chloride: 106 (ref 99–108)
Creatinine: 1.2 — AB (ref 0.5–1.1)
Glucose: 180
Potassium: 3.6 mEq/L (ref 3.5–5.1)
Sodium: 138 (ref 137–147)

## 2021-08-29 LAB — CBC AND DIFFERENTIAL
HCT: 40 (ref 36–46)
Hemoglobin: 12.8 (ref 12.0–16.0)
Neutrophils Absolute: 1.31
Platelets: 99 10*3/uL — AB (ref 150–400)
WBC: 2.9

## 2021-08-29 LAB — CBC: RBC: 4.04 (ref 3.87–5.11)

## 2021-09-01 ENCOUNTER — Inpatient Hospital Stay: Payer: PRIVATE HEALTH INSURANCE | Attending: Oncology

## 2021-09-01 ENCOUNTER — Encounter: Payer: Self-pay | Admitting: Oncology

## 2021-09-01 VITALS — BP 130/90 | HR 88 | Temp 97.6°F | Resp 18 | Ht 68.0 in | Wt 183.0 lb

## 2021-09-01 DIAGNOSIS — Z5189 Encounter for other specified aftercare: Secondary | ICD-10-CM | POA: Diagnosis not present

## 2021-09-01 DIAGNOSIS — C189 Malignant neoplasm of colon, unspecified: Secondary | ICD-10-CM | POA: Diagnosis present

## 2021-09-01 DIAGNOSIS — C7989 Secondary malignant neoplasm of other specified sites: Secondary | ICD-10-CM | POA: Diagnosis not present

## 2021-09-01 DIAGNOSIS — C7801 Secondary malignant neoplasm of right lung: Secondary | ICD-10-CM | POA: Insufficient documentation

## 2021-09-01 DIAGNOSIS — Z5111 Encounter for antineoplastic chemotherapy: Secondary | ICD-10-CM | POA: Insufficient documentation

## 2021-09-01 DIAGNOSIS — C7931 Secondary malignant neoplasm of brain: Secondary | ICD-10-CM | POA: Diagnosis not present

## 2021-09-01 DIAGNOSIS — C7802 Secondary malignant neoplasm of left lung: Secondary | ICD-10-CM | POA: Insufficient documentation

## 2021-09-01 DIAGNOSIS — C7971 Secondary malignant neoplasm of right adrenal gland: Secondary | ICD-10-CM | POA: Insufficient documentation

## 2021-09-01 DIAGNOSIS — C182 Malignant neoplasm of ascending colon: Secondary | ICD-10-CM

## 2021-09-01 MED ORDER — IRINOTECAN HCL CHEMO INJECTION 100 MG/5ML
135.0000 mg/m2 | Freq: Once | INTRAVENOUS | Status: AC
  Administered 2021-09-01: 280 mg via INTRAVENOUS
  Filled 2021-09-01: qty 10

## 2021-09-01 MED ORDER — SODIUM CHLORIDE 0.9 % IV SOLN: Freq: Once | INTRAVENOUS | Status: AC

## 2021-09-01 MED ORDER — SODIUM CHLORIDE 0.9% FLUSH: 10.0000 mL | INTRAVENOUS | Status: DC | PRN

## 2021-09-01 MED ORDER — SODIUM CHLORIDE 0.9 % IV SOLN
10.0000 mg | Freq: Once | INTRAVENOUS | Status: AC
  Administered 2021-09-01: 10 mg via INTRAVENOUS
  Filled 2021-09-01: qty 10

## 2021-09-01 MED ORDER — HEPARIN SOD (PORK) LOCK FLUSH 100 UNIT/ML IV SOLN: 500.0000 [IU] | Freq: Once | INTRAVENOUS | Status: DC | PRN

## 2021-09-01 MED ORDER — SODIUM CHLORIDE 0.9 % IV SOLN
150.0000 mg | Freq: Once | INTRAVENOUS | Status: AC
  Administered 2021-09-01: 150 mg via INTRAVENOUS
  Filled 2021-09-01: qty 150

## 2021-09-01 MED ORDER — SODIUM CHLORIDE 0.9 % IV SOLN
2400.0000 mg/m2 | INTRAVENOUS | Status: DC
  Administered 2021-09-01: 5000 mg via INTRAVENOUS
  Filled 2021-09-01: qty 100

## 2021-09-01 MED ORDER — LEUCOVORIN CALCIUM INJECTION 350 MG
400.0000 mg/m2 | Freq: Once | INTRAVENOUS | Status: AC
  Administered 2021-09-01: 836 mg via INTRAVENOUS
  Filled 2021-09-01: qty 41.8

## 2021-09-01 MED ORDER — PALONOSETRON HCL INJECTION 0.25 MG/5ML
0.2500 mg | Freq: Once | INTRAVENOUS | Status: AC
  Administered 2021-09-01: 0.25 mg via INTRAVENOUS
  Filled 2021-09-01: qty 5

## 2021-09-01 MED ORDER — ATROPINE SULFATE 1 MG/ML IV SOLN
0.5000 mg | Freq: Once | INTRAVENOUS | Status: AC | PRN
  Administered 2021-09-01: 0.5 mg via INTRAVENOUS
  Filled 2021-09-01: qty 1

## 2021-09-01 MED ORDER — FLUOROURACIL CHEMO INJECTION 2.5 GM/50ML
400.0000 mg/m2 | Freq: Once | INTRAVENOUS | Status: AC
  Administered 2021-09-01: 850 mg via INTRAVENOUS
  Filled 2021-09-01: qty 17

## 2021-09-01 MED ORDER — SODIUM CHLORIDE 0.9 % IV SOLN
5.0000 mg/kg | Freq: Once | INTRAVENOUS | Status: AC
  Administered 2021-09-01: 400 mg via INTRAVENOUS
  Filled 2021-09-01: qty 16

## 2021-09-01 NOTE — Patient Instructions (Signed)
Fluorouracil, 5-FU injection ?What is this medication? ?FLUOROURACIL, 5-FU (flure oh YOOR a sil) is a chemotherapy drug. It slows the growth of cancer cells. This medicine is used to treat many types of cancer like breast cancer, colon or rectal cancer, pancreatic cancer, and stomach cancer. ?This medicine may be used for other purposes; ask your health care provider or pharmacist if you have questions. ?COMMON BRAND NAME(S): Adrucil ?What should I tell my care team before I take this medication? ?They need to know if you have any of these conditions: ?blood disorders ?dihydropyrimidine dehydrogenase (DPD) deficiency ?infection (especially a virus infection such as chickenpox, cold sores, or herpes) ?kidney disease ?liver disease ?malnourished, poor nutrition ?recent or ongoing radiation therapy ?an unusual or allergic reaction to fluorouracil, other chemotherapy, other medicines, foods, dyes, or preservatives ?pregnant or trying to get pregnant ?breast-feeding ?How should I use this medication? ?This drug is given as an infusion or injection into a vein. It is administered in a hospital or clinic by a specially trained health care professional. ?Talk to your pediatrician regarding the use of this medicine in children. Special care may be needed. ?Overdosage: If you think you have taken too much of this medicine contact a poison control center or emergency room at once. ?NOTE: This medicine is only for you. Do not share this medicine with others. ?What if I miss a dose? ?It is important not to miss your dose. Call your doctor or health care professional if you are unable to keep an appointment. ?What may interact with this medication? ?Do not take this medicine with any of the following medications: ?live virus vaccines ?This medicine may also interact with the following medications: ?medicines that treat or prevent blood clots like warfarin, enoxaparin, and dalteparin ?This list may not describe all possible  interactions. Give your health care provider a list of all the medicines, herbs, non-prescription drugs, or dietary supplements you use. Also tell them if you smoke, drink alcohol, or use illegal drugs. Some items may interact with your medicine. ?What should I watch for while using this medication? ?Visit your doctor for checks on your progress. This drug may make you feel generally unwell. This is not uncommon, as chemotherapy can affect healthy cells as well as cancer cells. Report any side effects. Continue your course of treatment even though you feel ill unless your doctor tells you to stop. ?In some cases, you may be given additional medicines to help with side effects. Follow all directions for their use. ?Call your doctor or health care professional for advice if you get a fever, chills or sore throat, or other symptoms of a cold or flu. Do not treat yourself. This drug decreases your body's ability to fight infections. Try to avoid being around people who are sick. ?This medicine may increase your risk to bruise or bleed. Call your doctor or health care professional if you notice any unusual bleeding. ?Be careful brushing and flossing your teeth or using a toothpick because you may get an infection or bleed more easily. If you have any dental work done, tell your dentist you are receiving this medicine. ?Avoid taking products that contain aspirin, acetaminophen, ibuprofen, naproxen, or ketoprofen unless instructed by your doctor. These medicines may hide a fever. ?Do not become pregnant while taking this medicine. Women should inform their doctor if they wish to become pregnant or think they might be pregnant. There is a potential for serious side effects to an unborn child. Talk to your health care  professional or pharmacist for more information. Do not breast-feed an infant while taking this medicine. ?Men should inform their doctor if they wish to father a child. This medicine may lower sperm  counts. ?Do not treat diarrhea with over the counter products. Contact your doctor if you have diarrhea that lasts more than 2 days or if it is severe and watery. ?This medicine can make you more sensitive to the sun. Keep out of the sun. If you cannot avoid being in the sun, wear protective clothing and use sunscreen. Do not use sun lamps or tanning beds/booths. ?What side effects may I notice from receiving this medication? ?Side effects that you should report to your doctor or health care professional as soon as possible: ?allergic reactions like skin rash, itching or hives, swelling of the face, lips, or tongue ?low blood counts - this medicine may decrease the number of white blood cells, red blood cells and platelets. You may be at increased risk for infections and bleeding. ?signs of infection - fever or chills, cough, sore throat, pain or difficulty passing urine ?signs of decreased platelets or bleeding - bruising, pinpoint red spots on the skin, black, tarry stools, blood in the urine ?signs of decreased red blood cells - unusually weak or tired, fainting spells, lightheadedness ?breathing problems ?changes in vision ?chest pain ?mouth sores ?nausea and vomiting ?pain, swelling, redness at site where injected ?pain, tingling, numbness in the hands or feet ?redness, swelling, or sores on hands or feet ?stomach pain ?unusual bleeding ?Side effects that usually do not require medical attention (report to your doctor or health care professional if they continue or are bothersome): ?changes in finger or toe nails ?diarrhea ?dry or itchy skin ?hair loss ?headache ?loss of appetite ?sensitivity of eyes to the light ?stomach upset ?unusually teary eyes ?This list may not describe all possible side effects. Call your doctor for medical advice about side effects. You may report side effects to FDA at 1-800-FDA-1088. ?Where should I keep my medication? ?This drug is given in a hospital or clinic and will not be  stored at home. ?NOTE: This sheet is a summary. It may not cover all possible information. If you have questions about this medicine, talk to your doctor, pharmacist, or health care provider. ?? 2022 Elsevier/Gold Standard (2021-02-04 00:00:00) ?Irinotecan injection ?What is this medication? ?IRINOTECAN (ir in oh TEE kan ) is a chemotherapy drug. It is used to treat colon and rectal cancer. ?This medicine may be used for other purposes; ask your health care provider or pharmacist if you have questions. ?COMMON BRAND NAME(S): Camptosar ?What should I tell my care team before I take this medication? ?They need to know if you have any of these conditions: ?dehydration ?diarrhea ?infection (especially a virus infection such as chickenpox, cold sores, or herpes) ?liver disease ?low blood counts, like low white cell, platelet, or red cell counts ?low levels of calcium, magnesium, or potassium in the blood ?recent or ongoing radiation therapy ?an unusual or allergic reaction to irinotecan, other medicines, foods, dyes, or preservatives ?pregnant or trying to get pregnant ?breast-feeding ?How should I use this medication? ?This drug is given as an infusion into a vein. It is administered in a hospital or clinic by a specially trained health care professional. ?Talk to your pediatrician regarding the use of this medicine in children. Special care may be needed. ?Overdosage: If you think you have taken too much of this medicine contact a poison control center or emergency room at  once. ?NOTE: This medicine is only for you. Do not share this medicine with others. ?What if I miss a dose? ?It is important not to miss your dose. Call your doctor or health care professional if you are unable to keep an appointment. ?What may interact with this medication? ?Do not take this medicine with any of the following medications: ?cobicistat ?itraconazole ?This medicine may interact with the following medications: ?antiviral medicines for  HIV or AIDS ?certain antibiotics like rifampin or rifabutin ?certain medicines for fungal infections like ketoconazole, posaconazole, and voriconazole ?certain medicines for seizures like carbamazepine, phenobarbital, phen

## 2021-09-02 ENCOUNTER — Encounter: Payer: Self-pay | Admitting: Oncology

## 2021-09-02 ENCOUNTER — Other Ambulatory Visit: Payer: Self-pay | Admitting: Psychiatry

## 2021-09-02 DIAGNOSIS — F333 Major depressive disorder, recurrent, severe with psychotic symptoms: Secondary | ICD-10-CM

## 2021-09-02 DIAGNOSIS — F422 Mixed obsessional thoughts and acts: Secondary | ICD-10-CM

## 2021-09-03 ENCOUNTER — Inpatient Hospital Stay: Payer: PRIVATE HEALTH INSURANCE

## 2021-09-03 VITALS — BP 124/76 | HR 97 | Temp 97.7°F | Resp 18 | Wt 181.0 lb

## 2021-09-03 DIAGNOSIS — C182 Malignant neoplasm of ascending colon: Secondary | ICD-10-CM

## 2021-09-03 DIAGNOSIS — Z5111 Encounter for antineoplastic chemotherapy: Secondary | ICD-10-CM | POA: Diagnosis not present

## 2021-09-03 MED ORDER — SODIUM CHLORIDE 0.9% FLUSH
10.0000 mL | INTRAVENOUS | Status: DC | PRN
  Administered 2021-09-03: 10 mL

## 2021-09-03 MED ORDER — HEPARIN SOD (PORK) LOCK FLUSH 100 UNIT/ML IV SOLN
500.0000 [IU] | Freq: Once | INTRAVENOUS | Status: AC | PRN
  Administered 2021-09-03: 500 [IU]

## 2021-09-03 NOTE — Patient Instructions (Signed)
Fluorouracil, 5-FU injection What is this medication? FLUOROURACIL, 5-FU (flure oh YOOR a sil) is a chemotherapy drug. It slows the growth of cancer cells. This medicine is used to treat many types of cancer like breast cancer, colon or rectal cancer, pancreatic cancer, and stomach cancer. This medicine may be used for other purposes; ask your health care provider or pharmacist if you have questions. COMMON BRAND NAME(S): Adrucil What should I tell my care team before I take this medication? They need to know if you have any of these conditions: blood disorders dihydropyrimidine dehydrogenase (DPD) deficiency infection (especially a virus infection such as chickenpox, cold sores, or herpes) kidney disease liver disease malnourished, poor nutrition recent or ongoing radiation therapy an unusual or allergic reaction to fluorouracil, other chemotherapy, other medicines, foods, dyes, or preservatives pregnant or trying to get pregnant breast-feeding How should I use this medication? This drug is given as an infusion or injection into a vein. It is administered in a hospital or clinic by a specially trained health care professional. Talk to your pediatrician regarding the use of this medicine in children. Special care may be needed. Overdosage: If you think you have taken too much of this medicine contact a poison control center or emergency room at once. NOTE: This medicine is only for you. Do not share this medicine with others. What if I miss a dose? It is important not to miss your dose. Call your doctor or health care professional if you are unable to keep an appointment. What may interact with this medication? Do not take this medicine with any of the following medications: live virus vaccines This medicine may also interact with the following medications: medicines that treat or prevent blood clots like warfarin, enoxaparin, and dalteparin This list may not describe all possible  interactions. Give your health care provider a list of all the medicines, herbs, non-prescription drugs, or dietary supplements you use. Also tell them if you smoke, drink alcohol, or use illegal drugs. Some items may interact with your medicine. What should I watch for while using this medication? Visit your doctor for checks on your progress. This drug may make you feel generally unwell. This is not uncommon, as chemotherapy can affect healthy cells as well as cancer cells. Report any side effects. Continue your course of treatment even though you feel ill unless your doctor tells you to stop. In some cases, you may be given additional medicines to help with side effects. Follow all directions for their use. Call your doctor or health care professional for advice if you get a fever, chills or sore throat, or other symptoms of a cold or flu. Do not treat yourself. This drug decreases your body's ability to fight infections. Try to avoid being around people who are sick. This medicine may increase your risk to bruise or bleed. Call your doctor or health care professional if you notice any unusual bleeding. Be careful brushing and flossing your teeth or using a toothpick because you may get an infection or bleed more easily. If you have any dental work done, tell your dentist you are receiving this medicine. Avoid taking products that contain aspirin, acetaminophen, ibuprofen, naproxen, or ketoprofen unless instructed by your doctor. These medicines may hide a fever. Do not become pregnant while taking this medicine. Women should inform their doctor if they wish to become pregnant or think they might be pregnant. There is a potential for serious side effects to an unborn child. Talk to your health care   professional or pharmacist for more information. Do not breast-feed an infant while taking this medicine. Men should inform their doctor if they wish to father a child. This medicine may lower sperm  counts. Do not treat diarrhea with over the counter products. Contact your doctor if you have diarrhea that lasts more than 2 days or if it is severe and watery. This medicine can make you more sensitive to the sun. Keep out of the sun. If you cannot avoid being in the sun, wear protective clothing and use sunscreen. Do not use sun lamps or tanning beds/booths. What side effects may I notice from receiving this medication? Side effects that you should report to your doctor or health care professional as soon as possible: allergic reactions like skin rash, itching or hives, swelling of the face, lips, or tongue low blood counts - this medicine may decrease the number of white blood cells, red blood cells and platelets. You may be at increased risk for infections and bleeding. signs of infection - fever or chills, cough, sore throat, pain or difficulty passing urine signs of decreased platelets or bleeding - bruising, pinpoint red spots on the skin, black, tarry stools, blood in the urine signs of decreased red blood cells - unusually weak or tired, fainting spells, lightheadedness breathing problems changes in vision chest pain mouth sores nausea and vomiting pain, swelling, redness at site where injected pain, tingling, numbness in the hands or feet redness, swelling, or sores on hands or feet stomach pain unusual bleeding Side effects that usually do not require medical attention (report to your doctor or health care professional if they continue or are bothersome): changes in finger or toe nails diarrhea dry or itchy skin hair loss headache loss of appetite sensitivity of eyes to the light stomach upset unusually teary eyes This list may not describe all possible side effects. Call your doctor for medical advice about side effects. You may report side effects to FDA at 1-800-FDA-1088. Where should I keep my medication? This drug is given in a hospital or clinic and will not be  stored at home. NOTE: This sheet is a summary. It may not cover all possible information. If you have questions about this medicine, talk to your doctor, pharmacist, or health care provider.  2022 [employer] (2021-02-04 00:00:00)  

## 2021-09-06 ENCOUNTER — Encounter: Payer: Self-pay | Admitting: Oncology

## 2021-09-08 ENCOUNTER — Inpatient Hospital Stay: Payer: PRIVATE HEALTH INSURANCE

## 2021-09-08 ENCOUNTER — Encounter: Payer: PRIVATE HEALTH INSURANCE | Admitting: Psychology

## 2021-09-08 VITALS — BP 112/87 | HR 92 | Temp 97.9°F | Resp 18 | Ht 68.0 in | Wt 177.8 lb

## 2021-09-08 DIAGNOSIS — Z5111 Encounter for antineoplastic chemotherapy: Secondary | ICD-10-CM | POA: Diagnosis not present

## 2021-09-08 DIAGNOSIS — C182 Malignant neoplasm of ascending colon: Secondary | ICD-10-CM

## 2021-09-08 MED ORDER — FILGRASTIM-SNDZ 480 MCG/0.8ML IJ SOSY
480.0000 ug | PREFILLED_SYRINGE | Freq: Once | INTRAMUSCULAR | Status: AC
  Administered 2021-09-08: 480 ug via SUBCUTANEOUS
  Filled 2021-09-08: qty 0.8

## 2021-09-08 NOTE — Patient Instructions (Signed)
Filgrastim, G-CSF injection °What is this medication? °FILGRASTIM, G-CSF (fil GRA stim) is a granulocyte colony-stimulating factor that stimulates the growth of neutrophils, a type of white blood cell (WBC) important in the body's fight against infection. It is used to reduce the incidence of fever and infection in patients with certain types of cancer who are receiving chemotherapy that affects the bone marrow, to stimulate blood cell production for removal of WBCs from the body prior to a bone marrow transplantation, to reduce the incidence of fever and infection in patients who have severe chronic neutropenia, and to improve survival outcomes following high-dose radiation exposure that is toxic to the bone marrow. °This medicine may be used for other purposes; ask your health care provider or pharmacist if you have questions. °COMMON BRAND NAME(S): Neupogen, Nivestym, Releuko, Zarxio °What should I tell my care team before I take this medication? °They need to know if you have any of these conditions: °kidney disease °latex allergy °ongoing radiation therapy °sickle cell disease °an unusual or allergic reaction to filgrastim, pegfilgrastim, other medicines, foods, dyes, or preservatives °pregnant or trying to get pregnant °breast-feeding °How should I use this medication? °This medicine is for injection under the skin or infusion into a vein. As an infusion into a vein, it is usually given by a health care professional in a hospital or clinic setting. If you get this medicine at home, you will be taught how to prepare and give this medicine. Refer to the Instructions for Use that come with your medication packaging. Use exactly as directed. Take your medicine at regular intervals. Do not take your medicine more often than directed. °It is important that you put your used needles and syringes in a special sharps container. Do not put them in a trash can. If you do not have a sharps container, call your pharmacist  or healthcare provider to get one. °Talk to your pediatrician regarding the use of this medicine in children. While this drug may be prescribed for children as young as 7 months for selected conditions, precautions do apply. °Overdosage: If you think you have taken too much of this medicine contact a poison control center or emergency room at once. °NOTE: This medicine is only for you. Do not share this medicine with others. °What if I miss a dose? °It is important not to miss your dose. Call your doctor or health care professional if you miss a dose. °What may interact with this medication? °This medicine may interact with the following medications: °medicines that may cause a release of neutrophils, such as lithium °This list may not describe all possible interactions. Give your health care provider a list of all the medicines, herbs, non-prescription drugs, or dietary supplements you use. Also tell them if you smoke, drink alcohol, or use illegal drugs. Some items may interact with your medicine. °What should I watch for while using this medication? °Your condition will be monitored carefully while you are receiving this medicine. °You may need blood work done while you are taking this medicine. °Talk to your health care provider about your risk of cancer. You may be more at risk for certain types of cancer if you take this medicine. °What side effects may I notice from receiving this medication? °Side effects that you should report to your doctor or health care professional as soon as possible: °allergic reactions like skin rash, itching or hives, swelling of the face, lips, or tongue °back pain °dizziness or feeling faint °fever °pain, redness, or   irritation at site where injected °pinpoint red spots on the skin °shortness of breath or breathing problems °signs and symptoms of kidney injury like trouble passing urine, change in the amount of urine, or red or dark-brown urine °stomach or side pain, or pain at  the shoulder °swelling °tiredness °unusual bleeding or bruising °Side effects that usually do not require medical attention (report to your doctor or health care professional if they continue or are bothersome): °bone pain °cough °diarrhea °hair loss °headache °muscle pain °This list may not describe all possible side effects. Call your doctor for medical advice about side effects. You may report side effects to FDA at 1-800-FDA-1088. °Where should I keep my medication? °Keep out of the reach of children. °Store in a refrigerator between 2 and 8 degrees C (36 and 46 degrees F). Do not freeze. Keep in carton to protect from light. Throw away this medicine if vials or syringes are left out of the refrigerator for more than 24 hours. Throw away any unused medicine after the expiration date. °NOTE: This sheet is a summary. It may not cover all possible information. If you have questions about this medicine, talk to your doctor, pharmacist, or health care provider. °© 2022 [employer] (2021-02-04 00:00:00) ° °

## 2021-09-09 ENCOUNTER — Inpatient Hospital Stay: Payer: PRIVATE HEALTH INSURANCE

## 2021-09-09 VITALS — BP 103/89 | HR 114 | Temp 97.5°F | Resp 18 | Wt 179.0 lb

## 2021-09-09 DIAGNOSIS — Z5111 Encounter for antineoplastic chemotherapy: Secondary | ICD-10-CM | POA: Diagnosis not present

## 2021-09-09 DIAGNOSIS — C182 Malignant neoplasm of ascending colon: Secondary | ICD-10-CM

## 2021-09-09 MED ORDER — FILGRASTIM-SNDZ 480 MCG/0.8ML IJ SOSY
480.0000 ug | PREFILLED_SYRINGE | Freq: Once | INTRAMUSCULAR | Status: AC
  Administered 2021-09-09: 480 ug via SUBCUTANEOUS
  Filled 2021-09-09: qty 0.8

## 2021-09-09 NOTE — Patient Instructions (Signed)
Filgrastim, G-CSF injection °What is this medication? °FILGRASTIM, G-CSF (fil GRA stim) is a granulocyte colony-stimulating factor that stimulates the growth of neutrophils, a type of white blood cell (WBC) important in the body's fight against infection. It is used to reduce the incidence of fever and infection in patients with certain types of cancer who are receiving chemotherapy that affects the bone marrow, to stimulate blood cell production for removal of WBCs from the body prior to a bone marrow transplantation, to reduce the incidence of fever and infection in patients who have severe chronic neutropenia, and to improve survival outcomes following high-dose radiation exposure that is toxic to the bone marrow. °This medicine may be used for other purposes; ask your health care provider or pharmacist if you have questions. °COMMON BRAND NAME(S): Neupogen, Nivestym, Releuko, Zarxio °What should I tell my care team before I take this medication? °They need to know if you have any of these conditions: °kidney disease °latex allergy °ongoing radiation therapy °sickle cell disease °an unusual or allergic reaction to filgrastim, pegfilgrastim, other medicines, foods, dyes, or preservatives °pregnant or trying to get pregnant °breast-feeding °How should I use this medication? °This medicine is for injection under the skin or infusion into a vein. As an infusion into a vein, it is usually given by a health care professional in a hospital or clinic setting. If you get this medicine at home, you will be taught how to prepare and give this medicine. Refer to the Instructions for Use that come with your medication packaging. Use exactly as directed. Take your medicine at regular intervals. Do not take your medicine more often than directed. °It is important that you put your used needles and syringes in a special sharps container. Do not put them in a trash can. If you do not have a sharps container, call your pharmacist  or healthcare provider to get one. °Talk to your pediatrician regarding the use of this medicine in children. While this drug may be prescribed for children as young as 7 months for selected conditions, precautions do apply. °Overdosage: If you think you have taken too much of this medicine contact a poison control center or emergency room at once. °NOTE: This medicine is only for you. Do not share this medicine with others. °What if I miss a dose? °It is important not to miss your dose. Call your doctor or health care professional if you miss a dose. °What may interact with this medication? °This medicine may interact with the following medications: °medicines that may cause a release of neutrophils, such as lithium °This list may not describe all possible interactions. Give your health care provider a list of all the medicines, herbs, non-prescription drugs, or dietary supplements you use. Also tell them if you smoke, drink alcohol, or use illegal drugs. Some items may interact with your medicine. °What should I watch for while using this medication? °Your condition will be monitored carefully while you are receiving this medicine. °You may need blood work done while you are taking this medicine. °Talk to your health care provider about your risk of cancer. You may be more at risk for certain types of cancer if you take this medicine. °What side effects may I notice from receiving this medication? °Side effects that you should report to your doctor or health care professional as soon as possible: °allergic reactions like skin rash, itching or hives, swelling of the face, lips, or tongue °back pain °dizziness or feeling faint °fever °pain, redness, or   irritation at site where injected °pinpoint red spots on the skin °shortness of breath or breathing problems °signs and symptoms of kidney injury like trouble passing urine, change in the amount of urine, or red or dark-brown urine °stomach or side pain, or pain at  the shoulder °swelling °tiredness °unusual bleeding or bruising °Side effects that usually do not require medical attention (report to your doctor or health care professional if they continue or are bothersome): °bone pain °cough °diarrhea °hair loss °headache °muscle pain °This list may not describe all possible side effects. Call your doctor for medical advice about side effects. You may report side effects to FDA at 1-800-FDA-1088. °Where should I keep my medication? °Keep out of the reach of children. °Store in a refrigerator between 2 and 8 degrees C (36 and 46 degrees F). Do not freeze. Keep in carton to protect from light. Throw away this medicine if vials or syringes are left out of the refrigerator for more than 24 hours. Throw away any unused medicine after the expiration date. °NOTE: This sheet is a summary. It may not cover all possible information. If you have questions about this medicine, talk to your doctor, pharmacist, or health care provider. °© 2022 [employer] (2021-02-04 00:00:00) ° °

## 2021-09-10 ENCOUNTER — Inpatient Hospital Stay: Payer: PRIVATE HEALTH INSURANCE

## 2021-09-10 VITALS — BP 106/80 | HR 104 | Temp 98.7°F | Resp 18 | Wt 178.0 lb

## 2021-09-10 DIAGNOSIS — Z5111 Encounter for antineoplastic chemotherapy: Secondary | ICD-10-CM | POA: Diagnosis not present

## 2021-09-10 DIAGNOSIS — C182 Malignant neoplasm of ascending colon: Secondary | ICD-10-CM

## 2021-09-10 MED ORDER — FILGRASTIM-SNDZ 480 MCG/0.8ML IJ SOSY
480.0000 ug | PREFILLED_SYRINGE | Freq: Once | INTRAMUSCULAR | Status: AC
  Administered 2021-09-10: 480 ug via SUBCUTANEOUS
  Filled 2021-09-10: qty 0.8

## 2021-09-10 NOTE — Patient Instructions (Signed)
Filgrastim, G-CSF injection °What is this medication? °FILGRASTIM, G-CSF (fil GRA stim) is a granulocyte colony-stimulating factor that stimulates the growth of neutrophils, a type of white blood cell (WBC) important in the body's fight against infection. It is used to reduce the incidence of fever and infection in patients with certain types of cancer who are receiving chemotherapy that affects the bone marrow, to stimulate blood cell production for removal of WBCs from the body prior to a bone marrow transplantation, to reduce the incidence of fever and infection in patients who have severe chronic neutropenia, and to improve survival outcomes following high-dose radiation exposure that is toxic to the bone marrow. °This medicine may be used for other purposes; ask your health care provider or pharmacist if you have questions. °COMMON BRAND NAME(S): Neupogen, Nivestym, Releuko, Zarxio °What should I tell my care team before I take this medication? °They need to know if you have any of these conditions: °kidney disease °latex allergy °ongoing radiation therapy °sickle cell disease °an unusual or allergic reaction to filgrastim, pegfilgrastim, other medicines, foods, dyes, or preservatives °pregnant or trying to get pregnant °breast-feeding °How should I use this medication? °This medicine is for injection under the skin or infusion into a vein. As an infusion into a vein, it is usually given by a health care professional in a hospital or clinic setting. If you get this medicine at home, you will be taught how to prepare and give this medicine. Refer to the Instructions for Use that come with your medication packaging. Use exactly as directed. Take your medicine at regular intervals. Do not take your medicine more often than directed. °It is important that you put your used needles and syringes in a special sharps container. Do not put them in a trash can. If you do not have a sharps container, call your pharmacist  or healthcare provider to get one. °Talk to your pediatrician regarding the use of this medicine in children. While this drug may be prescribed for children as young as 7 months for selected conditions, precautions do apply. °Overdosage: If you think you have taken too much of this medicine contact a poison control center or emergency room at once. °NOTE: This medicine is only for you. Do not share this medicine with others. °What if I miss a dose? °It is important not to miss your dose. Call your doctor or health care professional if you miss a dose. °What may interact with this medication? °This medicine may interact with the following medications: °medicines that may cause a release of neutrophils, such as lithium °This list may not describe all possible interactions. Give your health care provider a list of all the medicines, herbs, non-prescription drugs, or dietary supplements you use. Also tell them if you smoke, drink alcohol, or use illegal drugs. Some items may interact with your medicine. °What should I watch for while using this medication? °Your condition will be monitored carefully while you are receiving this medicine. °You may need blood work done while you are taking this medicine. °Talk to your health care provider about your risk of cancer. You may be more at risk for certain types of cancer if you take this medicine. °What side effects may I notice from receiving this medication? °Side effects that you should report to your doctor or health care professional as soon as possible: °allergic reactions like skin rash, itching or hives, swelling of the face, lips, or tongue °back pain °dizziness or feeling faint °fever °pain, redness, or   irritation at site where injected °pinpoint red spots on the skin °shortness of breath or breathing problems °signs and symptoms of kidney injury like trouble passing urine, change in the amount of urine, or red or dark-brown urine °stomach or side pain, or pain at  the shoulder °swelling °tiredness °unusual bleeding or bruising °Side effects that usually do not require medical attention (report to your doctor or health care professional if they continue or are bothersome): °bone pain °cough °diarrhea °hair loss °headache °muscle pain °This list may not describe all possible side effects. Call your doctor for medical advice about side effects. You may report side effects to FDA at 1-800-FDA-1088. °Where should I keep my medication? °Keep out of the reach of children. °Store in a refrigerator between 2 and 8 degrees C (36 and 46 degrees F). Do not freeze. Keep in carton to protect from light. Throw away this medicine if vials or syringes are left out of the refrigerator for more than 24 hours. Throw away any unused medicine after the expiration date. °NOTE: This sheet is a summary. It may not cover all possible information. If you have questions about this medicine, talk to your doctor, pharmacist, or health care provider. °© 2022 [employer] (2021-02-04 00:00:00) ° °

## 2021-09-11 ENCOUNTER — Other Ambulatory Visit: Payer: Self-pay | Admitting: Pharmacist

## 2021-09-18 NOTE — Progress Notes (Signed)
?[facility]  ?[address] ?[place],  [place]  [address] ?[phone] ? ?Clinic Day:  09/19/2021 ? ?Referring physician: Greig Right, MD ? ?HISTORY OF PRESENT ILLNESS:  ?The patient is a 56 y.o. female with metastatic colon cancer, which includes spread of disease to her abdominal cavity, lungs, brain and right adrenal gland. She comes in today prior to her 28th cycle of palliative FOLFIRI/Avastin chemotherapy.  The patient claims to have tolerated her 27th cycle of treatment fairly well.  She has intermittent diarrhea and constipation over these past weeks, but the symptoms have not caused her any significant alteration in her daily quality of life.  She also denies having any new symptoms or findings which concern her overt signs of disease progression.  She definitely likes her chemotherapy being every 3 weeks, particularly as it gives her an extra week to recuperate after each cycle.   ?  ?With respect to her colon cancer history, she is status post a right hemicolectomy in early October 2018, followed by 12 cycles of adjuvant FOLFOX chemotherapy, which were completed in April 2019.  In December 2021, she underwent a left cerebellar metastatectomy, whose pathology was consistent with metastatic colon cancer.  Tumor testing did come back MMR normal.  CT scans recently showed evidence of her cancer being in multiple locations, which has her on palliative chemotherapy at this time.   ? ?PHYSICAL EXAM:  ?Blood pressure 119/74, pulse 94, temperature 98 ?F (36.7 ?C), resp. rate 14, height 5' 8"  (1.727 m), weight 180 lb 6.4 oz (81.8 kg), last menstrual period 02/22/2019, SpO2 97 %. ?Wt Readings from Last 3 Encounters:  ?09/19/21 180 lb 6.4 oz (81.8 kg)  ?09/10/21 178 lb (80.7 kg)  ?09/09/21 179 lb (81.2 kg)  ? ?Body mass index is 27.43 kg/m?Marland Kitchen ?Performance status (ECOG): 1 ?Physical Exam ?Constitutional:   ?   General: She is not in acute distress. ?   Appearance: Normal  appearance. She is normal weight. She is not ill-appearing.  ?HENT:  ?   Head: Normocephalic and atraumatic.  ?Eyes:  ?   General: No scleral icterus. ?   Extraocular Movements: Extraocular movements intact.  ?   Conjunctiva/sclera: Conjunctivae normal.  ?   Pupils: Pupils are equal, round, and reactive to light.  ?Cardiovascular:  ?   Rate and Rhythm: Normal rate and regular rhythm.  ?   Pulses: Normal pulses.  ?   Heart sounds: Normal heart sounds. No murmur heard. ?  No friction rub. No gallop.  ?Pulmonary:  ?   Effort: Pulmonary effort is normal. No respiratory distress.  ?   Breath sounds: Normal breath sounds.  ?Abdominal:  ?   General: Bowel sounds are normal. There is no distension.  ?   Palpations: Abdomen is soft. There is no hepatomegaly, splenomegaly or mass.  ?   Tenderness: There is no abdominal tenderness.  ?Musculoskeletal:     ?   General: Normal range of motion.  ?   Cervical back: Normal range of motion and neck supple.  ?   Right lower leg: No edema.  ?   Left lower leg: No edema.  ?Lymphadenopathy:  ?   Cervical: No cervical adenopathy.  ?Skin: ?   General: Skin is warm and dry.  ?Neurological:  ?   General: No focal deficit present.  ?   Mental Status: She is alert and oriented to person, place, and time. Mental status is at baseline.  ?Psychiatric:     ?  Mood and Affect: Mood normal.     ?   Behavior: Behavior normal.     ?   Thought Content: Thought content normal.     ?   Judgment: Judgment normal.  ? ? ?LABS:  ? ? Latest Reference Range & Units 09/19/21 00:00  ?WBC  3.2 (E)  ?RBC 3.87 - 5.11  4.02 (E)  ?Hemoglobin 12.0 - 16.0  12.8 (E)  ?HCT 36 - 46  39 (E)  ?MCV 81 - 99  98 (E)  ?Platelets 150 - 400 K/uL 120 ! (E)  ?NEUT#  1.63 (E)  ?!: Data is abnormal ?(E): External lab results ? Latest Reference Range & Units 09/19/21 00:00  ?Sodium 137 - 147  142 (E)  ?Potassium 3.5 - 5.1 mEq/L 3.8 (E)  ?Chloride 99 - 108  107 (E)  ?CO2 13 - 22  23 ! (E)  ?Glucose  110 (E)  ?BUN 4 - 21  11 (E)   ?Creatinine 0.5 - 1.1  1.0 (E)  ?Calcium 8.7 - 10.7  8.9 (E)  ?Alkaline Phosphatase 25 - 125  128 ! (E)  ?Albumin 3.5 - 5.0  4.1 (E)  ?AST 13 - 35  32 (E)  ?ALT 7 - 35 U/L 32 (E)  ?Bilirubin, Total  0.4 (E)  ?!: Data is abnormal ?(E): External lab result ? ?ASSESSMENT & PLAN:  ?Assessment/Plan:  A 56 y.o. female with metastatic colon cancer.  She will proceed with her 28th cycle of FOLFIRI/Avastin next week, which is now being given every 3 weeks.  Clinically, she is doing well.  I will see her back in 3 weeks before she heads into her 29th cycle of FOLFIRI/Avastin. The patient understands all the plans discussed today and is in agreement with them.   ? ? ?Hovanes Hymas Macarthur Critchley, MD ? ? ? ?  ?

## 2021-09-19 ENCOUNTER — Inpatient Hospital Stay (INDEPENDENT_AMBULATORY_CARE_PROVIDER_SITE_OTHER): Payer: PRIVATE HEALTH INSURANCE | Admitting: Oncology

## 2021-09-19 ENCOUNTER — Inpatient Hospital Stay: Payer: PRIVATE HEALTH INSURANCE

## 2021-09-19 ENCOUNTER — Encounter: Payer: Self-pay | Admitting: Oncology

## 2021-09-19 ENCOUNTER — Other Ambulatory Visit: Payer: Self-pay | Admitting: Hematology and Oncology

## 2021-09-19 ENCOUNTER — Other Ambulatory Visit: Payer: Self-pay

## 2021-09-19 VITALS — BP 119/74 | HR 94 | Temp 98.0°F | Resp 14 | Ht 68.0 in | Wt 180.4 lb

## 2021-09-19 DIAGNOSIS — C182 Malignant neoplasm of ascending colon: Secondary | ICD-10-CM | POA: Diagnosis not present

## 2021-09-19 LAB — BASIC METABOLIC PANEL
BUN: 11 (ref 4–21)
CO2: 23 — AB (ref 13–22)
Chloride: 107 (ref 99–108)
Creatinine: 1 (ref 0.5–1.1)
Glucose: 110
Potassium: 3.8 mEq/L (ref 3.5–5.1)
Sodium: 142 (ref 137–147)

## 2021-09-19 LAB — CBC AND DIFFERENTIAL
HCT: 39 (ref 36–46)
Hemoglobin: 12.8 (ref 12.0–16.0)
Neutrophils Absolute: 1.63
Platelets: 120 10*3/uL — AB (ref 150–400)
WBC: 3.2

## 2021-09-19 LAB — HEPATIC FUNCTION PANEL
ALT: 32 U/L (ref 7–35)
AST: 32 (ref 13–35)
Alkaline Phosphatase: 128 — AB (ref 25–125)
Bilirubin, Total: 0.4

## 2021-09-19 LAB — COMPREHENSIVE METABOLIC PANEL
Albumin: 4.1 (ref 3.5–5.0)
Calcium: 8.9 (ref 8.7–10.7)

## 2021-09-19 LAB — CBC
MCV: 98 (ref 81–99)
RBC: 4.02 (ref 3.87–5.11)

## 2021-09-20 ENCOUNTER — Encounter: Payer: Self-pay | Admitting: Oncology

## 2021-09-22 ENCOUNTER — Inpatient Hospital Stay: Payer: PRIVATE HEALTH INSURANCE

## 2021-09-22 ENCOUNTER — Other Ambulatory Visit: Payer: Self-pay

## 2021-09-22 VITALS — BP 124/45 | HR 71 | Temp 97.9°F | Wt 171.0 lb

## 2021-09-22 DIAGNOSIS — C183 Malignant neoplasm of hepatic flexure: Secondary | ICD-10-CM

## 2021-09-22 DIAGNOSIS — Z5111 Encounter for antineoplastic chemotherapy: Secondary | ICD-10-CM | POA: Diagnosis not present

## 2021-09-22 DIAGNOSIS — C182 Malignant neoplasm of ascending colon: Secondary | ICD-10-CM

## 2021-09-22 LAB — TOTAL PROTEIN, URINE DIPSTICK: Protein, ur: NEGATIVE mg/dL

## 2021-09-22 MED ORDER — SODIUM CHLORIDE 0.9 % IV SOLN
10.0000 mg | Freq: Once | INTRAVENOUS | Status: AC
  Administered 2021-09-22: 10 mg via INTRAVENOUS
  Filled 2021-09-22: qty 10

## 2021-09-22 MED ORDER — PALONOSETRON HCL INJECTION 0.25 MG/5ML
0.2500 mg | Freq: Once | INTRAVENOUS | Status: AC
  Administered 2021-09-22: 0.25 mg via INTRAVENOUS
  Filled 2021-09-22: qty 5

## 2021-09-22 MED ORDER — SODIUM CHLORIDE 0.9 % IV SOLN
150.0000 mg | Freq: Once | INTRAVENOUS | Status: AC
  Administered 2021-09-22: 150 mg via INTRAVENOUS
  Filled 2021-09-22: qty 150

## 2021-09-22 MED ORDER — ATROPINE SULFATE 1 MG/ML IV SOLN
0.5000 mg | Freq: Once | INTRAVENOUS | Status: AC | PRN
  Administered 2021-09-22: 0.5 mg via INTRAVENOUS
  Filled 2021-09-22: qty 1

## 2021-09-22 MED ORDER — FLUOROURACIL CHEMO INJECTION 2.5 GM/50ML
400.0000 mg/m2 | Freq: Once | INTRAVENOUS | Status: AC
  Administered 2021-09-22: 850 mg via INTRAVENOUS
  Filled 2021-09-22: qty 17

## 2021-09-22 MED ORDER — HEPARIN SOD (PORK) LOCK FLUSH 100 UNIT/ML IV SOLN: 500.0000 [IU] | Freq: Once | INTRAVENOUS | Status: DC | PRN

## 2021-09-22 MED ORDER — IRINOTECAN HCL CHEMO INJECTION 100 MG/5ML
135.0000 mg/m2 | Freq: Once | INTRAVENOUS | Status: AC
  Administered 2021-09-22: 280 mg via INTRAVENOUS
  Filled 2021-09-22: qty 10

## 2021-09-22 MED ORDER — SODIUM CHLORIDE 0.9 % IV SOLN
2400.0000 mg/m2 | INTRAVENOUS | Status: DC
  Administered 2021-09-22: 5000 mg via INTRAVENOUS
  Filled 2021-09-22: qty 100

## 2021-09-22 MED ORDER — LEUCOVORIN CALCIUM INJECTION 350 MG
400.0000 mg/m2 | Freq: Once | INTRAVENOUS | Status: AC
  Administered 2021-09-22: 836 mg via INTRAVENOUS
  Filled 2021-09-22: qty 41.8

## 2021-09-22 MED ORDER — SODIUM CHLORIDE 0.9 % IV SOLN
5.0000 mg/kg | Freq: Once | INTRAVENOUS | Status: AC
  Administered 2021-09-22: 400 mg via INTRAVENOUS
  Filled 2021-09-22: qty 16

## 2021-09-22 MED ORDER — SODIUM CHLORIDE 0.9 % IV SOLN: Freq: Once | INTRAVENOUS | Status: AC

## 2021-09-22 MED ORDER — SODIUM CHLORIDE 0.9% FLUSH: 10.0000 mL | INTRAVENOUS | Status: DC | PRN

## 2021-09-22 NOTE — Patient Instructions (Signed)
IFluorouracil, 5-FU injection ?What is this medication? ?FLUOROURACIL, 5-FU (flure oh YOOR a sil) is a chemotherapy drug. It slows the growth of cancer cells. This medicine is used to treat many types of cancer like breast cancer, colon or rectal cancer, pancreatic cancer, and stomach cancer. ?This medicine may be used for other purposes; ask your health care provider or pharmacist if you have questions. ?COMMON BRAND NAME(S): Adrucil ?What should I tell my care team before I take this medication? ?They need to know if you have any of these conditions: ?blood disorders ?dihydropyrimidine dehydrogenase (DPD) deficiency ?infection (especially a virus infection such as chickenpox, cold sores, or herpes) ?kidney disease ?liver disease ?malnourished, poor nutrition ?recent or ongoing radiation therapy ?an unusual or allergic reaction to fluorouracil, other chemotherapy, other medicines, foods, dyes, or preservatives ?pregnant or trying to get pregnant ?breast-feeding ?How should I use this medication? ?This drug is given as an infusion or injection into a vein. It is administered in a hospital or clinic by a specially trained health care professional. ?Talk to your pediatrician regarding the use of this medicine in children. Special care may be needed. ?Overdosage: If you think you have taken too much of this medicine contact a poison control center or emergency room at once. ?NOTE: This medicine is only for you. Do not share this medicine with others. ?What if I miss a dose? ?It is important not to miss your dose. Call your doctor or health care professional if you are unable to keep an appointment. ?What may interact with this medication? ?Do not take this medicine with any of the following medications: ?live virus vaccines ?This medicine may also interact with the following medications: ?medicines that treat or prevent blood clots like warfarin, enoxaparin, and dalteparin ?This list may not describe all possible  interactions. Give your health care provider a list of all the medicines, herbs, non-prescription drugs, or dietary supplements you use. Also tell them if you smoke, drink alcohol, or use illegal drugs. Some items may interact with your medicine. ?What should I watch for while using this medication? ?Visit your doctor for checks on your progress. This drug may make you feel generally unwell. This is not uncommon, as chemotherapy can affect healthy cells as well as cancer cells. Report any side effects. Continue your course of treatment even though you feel ill unless your doctor tells you to stop. ?In some cases, you may be given additional medicines to help with side effects. Follow all directions for their use. ?Call your doctor or health care professional for advice if you get a fever, chills or sore throat, or other symptoms of a cold or flu. Do not treat yourself. This drug decreases your body's ability to fight infections. Try to avoid being around people who are sick. ?This medicine may increase your risk to bruise or bleed. Call your doctor or health care professional if you notice any unusual bleeding. ?Be careful brushing and flossing your teeth or using a toothpick because you may get an infection or bleed more easily. If you have any dental work done, tell your dentist you are receiving this medicine. ?Avoid taking products that contain aspirin, acetaminophen, ibuprofen, naproxen, or ketoprofen unless instructed by your doctor. These medicines may hide a fever. ?Do not become pregnant while taking this medicine. Women should inform their doctor if they wish to become pregnant or think they might be pregnant. There is a potential for serious side effects to an unborn child. Talk to your health care  professional or pharmacist for more information. Do not breast-feed an infant while taking this medicine. ?Men should inform their doctor if they wish to father a child. This medicine may lower sperm  counts. ?Do not treat diarrhea with over the counter products. Contact your doctor if you have diarrhea that lasts more than 2 days or if it is severe and watery. ?This medicine can make you more sensitive to the sun. Keep out of the sun. If you cannot avoid being in the sun, wear protective clothing and use sunscreen. Do not use sun lamps or tanning beds/booths. ?What side effects may I notice from receiving this medication? ?Side effects that you should report to your doctor or health care professional as soon as possible: ?allergic reactions like skin rash, itching or hives, swelling of the face, lips, or tongue ?low blood counts - this medicine may decrease the number of white blood cells, red blood cells and platelets. You may be at increased risk for infections and bleeding. ?signs of infection - fever or chills, cough, sore throat, pain or difficulty passing urine ?signs of decreased platelets or bleeding - bruising, pinpoint red spots on the skin, black, tarry stools, blood in the urine ?signs of decreased red blood cells - unusually weak or tired, fainting spells, lightheadedness ?breathing problems ?changes in vision ?chest pain ?mouth sores ?nausea and vomiting ?pain, swelling, redness at site where injected ?pain, tingling, numbness in the hands or feet ?redness, swelling, or sores on hands or feet ?stomach pain ?unusual bleeding ?Side effects that usually do not require medical attention (report to your doctor or health care professional if they continue or are bothersome): ?changes in finger or toe nails ?diarrhea ?dry or itchy skin ?hair loss ?headache ?loss of appetite ?sensitivity of eyes to the light ?stomach upset ?unusually teary eyes ?This list may not describe all possible side effects. Call your doctor for medical advice about side effects. You may report side effects to FDA at 1-800-FDA-1088. ?Where should I keep my medication? ?This drug is given in a hospital or clinic and will not be  stored at home. ?NOTE: This sheet is a summary. It may not cover all possible information. If you have questions about this medicine, talk to your doctor, pharmacist, or health care provider. ?? 2023 Elsevier/Gold Standard (2021-04-18 00:00:00) ? ?

## 2021-09-24 ENCOUNTER — Inpatient Hospital Stay: Payer: PRIVATE HEALTH INSURANCE

## 2021-09-24 VITALS — BP 117/77 | HR 104 | Temp 98.1°F | Resp 20 | Wt 179.0 lb

## 2021-09-24 DIAGNOSIS — Z5111 Encounter for antineoplastic chemotherapy: Secondary | ICD-10-CM | POA: Diagnosis not present

## 2021-09-24 DIAGNOSIS — C182 Malignant neoplasm of ascending colon: Secondary | ICD-10-CM

## 2021-09-24 MED ORDER — HEPARIN SOD (PORK) LOCK FLUSH 100 UNIT/ML IV SOLN
500.0000 [IU] | Freq: Once | INTRAVENOUS | Status: AC | PRN
  Administered 2021-09-24: 500 [IU]

## 2021-09-24 MED ORDER — SODIUM CHLORIDE 0.9% FLUSH
10.0000 mL | INTRAVENOUS | Status: DC | PRN
  Administered 2021-09-24: 10 mL

## 2021-09-24 NOTE — Patient Instructions (Signed)
Fluorouracil, 5-FU injection What is this medication? FLUOROURACIL, 5-FU (flure oh YOOR a sil) is a chemotherapy drug. It slows the growth of cancer cells. This medicine is used to treat many types of cancer like breast cancer, colon or rectal cancer, pancreatic cancer, and stomach cancer. This medicine may be used for other purposes; ask your health care provider or pharmacist if you have questions. COMMON BRAND NAME(S): Adrucil What should I tell my care team before I take this medication? They need to know if you have any of these conditions: blood disorders dihydropyrimidine dehydrogenase (DPD) deficiency infection (especially a virus infection such as chickenpox, cold sores, or herpes) kidney disease liver disease malnourished, poor nutrition recent or ongoing radiation therapy an unusual or allergic reaction to fluorouracil, other chemotherapy, other medicines, foods, dyes, or preservatives pregnant or trying to get pregnant breast-feeding How should I use this medication? This drug is given as an infusion or injection into a vein. It is administered in a hospital or clinic by a specially trained health care professional. Talk to your pediatrician regarding the use of this medicine in children. Special care may be needed. Overdosage: If you think you have taken too much of this medicine contact a poison control center or emergency room at once. NOTE: This medicine is only for you. Do not share this medicine with others. What if I miss a dose? It is important not to miss your dose. Call your doctor or health care professional if you are unable to keep an appointment. What may interact with this medication? Do not take this medicine with any of the following medications: live virus vaccines This medicine may also interact with the following medications: medicines that treat or prevent blood clots like warfarin, enoxaparin, and dalteparin This list may not describe all possible  interactions. Give your health care provider a list of all the medicines, herbs, non-prescription drugs, or dietary supplements you use. Also tell them if you smoke, drink alcohol, or use illegal drugs. Some items may interact with your medicine. What should I watch for while using this medication? Visit your doctor for checks on your progress. This drug may make you feel generally unwell. This is not uncommon, as chemotherapy can affect healthy cells as well as cancer cells. Report any side effects. Continue your course of treatment even though you feel ill unless your doctor tells you to stop. In some cases, you may be given additional medicines to help with side effects. Follow all directions for their use. Call your doctor or health care professional for advice if you get a fever, chills or sore throat, or other symptoms of a cold or flu. Do not treat yourself. This drug decreases your body's ability to fight infections. Try to avoid being around people who are sick. This medicine may increase your risk to bruise or bleed. Call your doctor or health care professional if you notice any unusual bleeding. Be careful brushing and flossing your teeth or using a toothpick because you may get an infection or bleed more easily. If you have any dental work done, tell your dentist you are receiving this medicine. Avoid taking products that contain aspirin, acetaminophen, ibuprofen, naproxen, or ketoprofen unless instructed by your doctor. These medicines may hide a fever. Do not become pregnant while taking this medicine. Women should inform their doctor if they wish to become pregnant or think they might be pregnant. There is a potential for serious side effects to an unborn child. Talk to your health care   professional or pharmacist for more information. Do not breast-feed an infant while taking this medicine. Men should inform their doctor if they wish to father a child. This medicine may lower sperm  counts. Do not treat diarrhea with over the counter products. Contact your doctor if you have diarrhea that lasts more than 2 days or if it is severe and watery. This medicine can make you more sensitive to the sun. Keep out of the sun. If you cannot avoid being in the sun, wear protective clothing and use sunscreen. Do not use sun lamps or tanning beds/booths. What side effects may I notice from receiving this medication? Side effects that you should report to your doctor or health care professional as soon as possible: allergic reactions like skin rash, itching or hives, swelling of the face, lips, or tongue low blood counts - this medicine may decrease the number of white blood cells, red blood cells and platelets. You may be at increased risk for infections and bleeding. signs of infection - fever or chills, cough, sore throat, pain or difficulty passing urine signs of decreased platelets or bleeding - bruising, pinpoint red spots on the skin, black, tarry stools, blood in the urine signs of decreased red blood cells - unusually weak or tired, fainting spells, lightheadedness breathing problems changes in vision chest pain mouth sores nausea and vomiting pain, swelling, redness at site where injected pain, tingling, numbness in the hands or feet redness, swelling, or sores on hands or feet stomach pain unusual bleeding Side effects that usually do not require medical attention (report to your doctor or health care professional if they continue or are bothersome): changes in finger or toe nails diarrhea dry or itchy skin hair loss headache loss of appetite sensitivity of eyes to the light stomach upset unusually teary eyes This list may not describe all possible side effects. Call your doctor for medical advice about side effects. You may report side effects to FDA at 1-800-FDA-1088. Where should I keep my medication? This drug is given in a hospital or clinic and will not be  stored at home. NOTE: This sheet is a summary. It may not cover all possible information. If you have questions about this medicine, talk to your doctor, pharmacist, or health care provider.  2023 Elsevier/Gold Standard (2021-04-18 00:00:00)  

## 2021-09-25 ENCOUNTER — Encounter: Payer: Self-pay | Admitting: Oncology

## 2021-09-29 ENCOUNTER — Ambulatory Visit: Payer: PRIVATE HEALTH INSURANCE

## 2021-09-30 ENCOUNTER — Ambulatory Visit: Payer: PRIVATE HEALTH INSURANCE

## 2021-10-01 ENCOUNTER — Ambulatory Visit: Payer: PRIVATE HEALTH INSURANCE

## 2021-10-06 ENCOUNTER — Other Ambulatory Visit: Payer: Self-pay

## 2021-10-06 ENCOUNTER — Inpatient Hospital Stay: Payer: PRIVATE HEALTH INSURANCE | Attending: Oncology

## 2021-10-06 VITALS — BP 122/80 | HR 102 | Temp 98.5°F | Resp 20 | Ht 68.0 in | Wt 176.2 lb

## 2021-10-06 DIAGNOSIS — C786 Secondary malignant neoplasm of retroperitoneum and peritoneum: Secondary | ICD-10-CM | POA: Diagnosis not present

## 2021-10-06 DIAGNOSIS — Z5189 Encounter for other specified aftercare: Secondary | ICD-10-CM | POA: Insufficient documentation

## 2021-10-06 DIAGNOSIS — C7801 Secondary malignant neoplasm of right lung: Secondary | ICD-10-CM | POA: Insufficient documentation

## 2021-10-06 DIAGNOSIS — C7802 Secondary malignant neoplasm of left lung: Secondary | ICD-10-CM | POA: Diagnosis not present

## 2021-10-06 DIAGNOSIS — C7971 Secondary malignant neoplasm of right adrenal gland: Secondary | ICD-10-CM | POA: Insufficient documentation

## 2021-10-06 DIAGNOSIS — Z5111 Encounter for antineoplastic chemotherapy: Secondary | ICD-10-CM | POA: Diagnosis present

## 2021-10-06 DIAGNOSIS — C189 Malignant neoplasm of colon, unspecified: Secondary | ICD-10-CM | POA: Insufficient documentation

## 2021-10-06 DIAGNOSIS — C7931 Secondary malignant neoplasm of brain: Secondary | ICD-10-CM | POA: Insufficient documentation

## 2021-10-06 DIAGNOSIS — C182 Malignant neoplasm of ascending colon: Secondary | ICD-10-CM

## 2021-10-06 MED ORDER — FILGRASTIM-SNDZ 480 MCG/0.8ML IJ SOSY
480.0000 ug | PREFILLED_SYRINGE | Freq: Once | INTRAMUSCULAR | Status: AC
  Administered 2021-10-06: 480 ug via SUBCUTANEOUS
  Filled 2021-10-06: qty 0.8

## 2021-10-06 NOTE — Patient Instructions (Signed)
Filgrastim, G-CSF injection ?What is this medication? ?FILGRASTIM, G-CSF (fil GRA stim) is a granulocyte colony-stimulating factor that stimulates the growth of neutrophils, a type of white blood cell (WBC) important in the body's fight against infection. It is used to reduce the incidence of fever and infection in patients with certain types of cancer who are receiving chemotherapy that affects the bone marrow, to stimulate blood cell production for removal of WBCs from the body prior to a bone marrow transplantation, to reduce the incidence of fever and infection in patients who have severe chronic neutropenia, and to improve survival outcomes following high-dose radiation exposure that is toxic to the bone marrow. ?This medicine may be used for other purposes; ask your health care provider or pharmacist if you have questions. ?COMMON BRAND NAME(S): Neupogen, Nivestym, Releuko, Zarxio ?What should I tell my care team before I take this medication? ?They need to know if you have any of these conditions: ?kidney disease ?latex allergy ?ongoing radiation therapy ?sickle cell disease ?an unusual or allergic reaction to filgrastim, pegfilgrastim, other medicines, foods, dyes, or preservatives ?pregnant or trying to get pregnant ?breast-feeding ?How should I use this medication? ?This medicine is for injection under the skin or infusion into a vein. As an infusion into a vein, it is usually given by a health care professional in a hospital or clinic setting. If you get this medicine at home, you will be taught how to prepare and give this medicine. Refer to the Instructions for Use that come with your medication packaging. Use exactly as directed. Take your medicine at regular intervals. Do not take your medicine more often than directed. ?It is important that you put your used needles and syringes in a special sharps container. Do not put them in a trash can. If you do not have a sharps container, call your pharmacist  or healthcare provider to get one. ?Talk to your pediatrician regarding the use of this medicine in children. While this drug may be prescribed for children as young as 7 months for selected conditions, precautions do apply. ?Overdosage: If you think you have taken too much of this medicine contact a poison control center or emergency room at once. ?NOTE: This medicine is only for you. Do not share this medicine with others. ?What if I miss a dose? ?It is important not to miss your dose. Call your doctor or health care professional if you miss a dose. ?What may interact with this medication? ?This medicine may interact with the following medications: ?medicines that may cause a release of neutrophils, such as lithium ?This list may not describe all possible interactions. Give your health care provider a list of all the medicines, herbs, non-prescription drugs, or dietary supplements you use. Also tell them if you smoke, drink alcohol, or use illegal drugs. Some items may interact with your medicine. ?What should I watch for while using this medication? ?Your condition will be monitored carefully while you are receiving this medicine. ?You may need blood work done while you are taking this medicine. ?Talk to your health care provider about your risk of cancer. You may be more at risk for certain types of cancer if you take this medicine. ?What side effects may I notice from receiving this medication? ?Side effects that you should report to your doctor or health care professional as soon as possible: ?allergic reactions like skin rash, itching or hives, swelling of the face, lips, or tongue ?back pain ?dizziness or feeling faint ?fever ?pain, redness, or   irritation at site where injected ?pinpoint red spots on the skin ?shortness of breath or breathing problems ?signs and symptoms of kidney injury like trouble passing urine, change in the amount of urine, or red or dark-brown urine ?stomach or side pain, or pain at  the shoulder ?swelling ?tiredness ?unusual bleeding or bruising ?Side effects that usually do not require medical attention (report to your doctor or health care professional if they continue or are bothersome): ?bone pain ?cough ?diarrhea ?hair loss ?headache ?muscle pain ?This list may not describe all possible side effects. Call your doctor for medical advice about side effects. You may report side effects to FDA at 1-800-FDA-1088. ?Where should I keep my medication? ?Keep out of the reach of children. ?Store in a refrigerator between 2 and 8 degrees C (36 and 46 degrees F). Do not freeze. Keep in carton to protect from light. Throw away this medicine if vials or syringes are left out of the refrigerator for more than 24 hours. Throw away any unused medicine after the expiration date. ?NOTE: This sheet is a summary. It may not cover all possible information. If you have questions about this medicine, talk to your doctor, pharmacist, or health care provider. ?? 2023 Elsevier/Gold Standard (2021-04-18 00:00:00) ? ?

## 2021-10-07 ENCOUNTER — Inpatient Hospital Stay: Payer: PRIVATE HEALTH INSURANCE

## 2021-10-07 VITALS — BP 110/70 | HR 86 | Temp 97.9°F | Resp 20 | Wt 174.0 lb

## 2021-10-07 DIAGNOSIS — C182 Malignant neoplasm of ascending colon: Secondary | ICD-10-CM

## 2021-10-07 DIAGNOSIS — Z5111 Encounter for antineoplastic chemotherapy: Secondary | ICD-10-CM | POA: Diagnosis not present

## 2021-10-07 MED ORDER — FILGRASTIM-SNDZ 480 MCG/0.8ML IJ SOSY
480.0000 ug | PREFILLED_SYRINGE | Freq: Once | INTRAMUSCULAR | Status: AC
  Administered 2021-10-07: 480 ug via SUBCUTANEOUS
  Filled 2021-10-07: qty 0.8

## 2021-10-07 NOTE — Patient Instructions (Signed)
Filgrastim, G-CSF injection ?What is this medication? ?FILGRASTIM, G-CSF (fil GRA stim) is a granulocyte colony-stimulating factor that stimulates the growth of neutrophils, a type of white blood cell (WBC) important in the body's fight against infection. It is used to reduce the incidence of fever and infection in patients with certain types of cancer who are receiving chemotherapy that affects the bone marrow, to stimulate blood cell production for removal of WBCs from the body prior to a bone marrow transplantation, to reduce the incidence of fever and infection in patients who have severe chronic neutropenia, and to improve survival outcomes following high-dose radiation exposure that is toxic to the bone marrow. ?This medicine may be used for other purposes; ask your health care provider or pharmacist if you have questions. ?COMMON BRAND NAME(S): Neupogen, Nivestym, Releuko, Zarxio ?What should I tell my care team before I take this medication? ?They need to know if you have any of these conditions: ?kidney disease ?latex allergy ?ongoing radiation therapy ?sickle cell disease ?an unusual or allergic reaction to filgrastim, pegfilgrastim, other medicines, foods, dyes, or preservatives ?pregnant or trying to get pregnant ?breast-feeding ?How should I use this medication? ?This medicine is for injection under the skin or infusion into a vein. As an infusion into a vein, it is usually given by a health care professional in a hospital or clinic setting. If you get this medicine at home, you will be taught how to prepare and give this medicine. Refer to the Instructions for Use that come with your medication packaging. Use exactly as directed. Take your medicine at regular intervals. Do not take your medicine more often than directed. ?It is important that you put your used needles and syringes in a special sharps container. Do not put them in a trash can. If you do not have a sharps container, call your pharmacist  or healthcare provider to get one. ?Talk to your pediatrician regarding the use of this medicine in children. While this drug may be prescribed for children as young as 7 months for selected conditions, precautions do apply. ?Overdosage: If you think you have taken too much of this medicine contact a poison control center or emergency room at once. ?NOTE: This medicine is only for you. Do not share this medicine with others. ?What if I miss a dose? ?It is important not to miss your dose. Call your doctor or health care professional if you miss a dose. ?What may interact with this medication? ?This medicine may interact with the following medications: ?medicines that may cause a release of neutrophils, such as lithium ?This list may not describe all possible interactions. Give your health care provider a list of all the medicines, herbs, non-prescription drugs, or dietary supplements you use. Also tell them if you smoke, drink alcohol, or use illegal drugs. Some items may interact with your medicine. ?What should I watch for while using this medication? ?Your condition will be monitored carefully while you are receiving this medicine. ?You may need blood work done while you are taking this medicine. ?Talk to your health care provider about your risk of cancer. You may be more at risk for certain types of cancer if you take this medicine. ?What side effects may I notice from receiving this medication? ?Side effects that you should report to your doctor or health care professional as soon as possible: ?allergic reactions like skin rash, itching or hives, swelling of the face, lips, or tongue ?back pain ?dizziness or feeling faint ?fever ?pain, redness, or   irritation at site where injected ?pinpoint red spots on the skin ?shortness of breath or breathing problems ?signs and symptoms of kidney injury like trouble passing urine, change in the amount of urine, or red or dark-brown urine ?stomach or side pain, or pain at  the shoulder ?swelling ?tiredness ?unusual bleeding or bruising ?Side effects that usually do not require medical attention (report to your doctor or health care professional if they continue or are bothersome): ?bone pain ?cough ?diarrhea ?hair loss ?headache ?muscle pain ?This list may not describe all possible side effects. Call your doctor for medical advice about side effects. You may report side effects to FDA at 1-800-FDA-1088. ?Where should I keep my medication? ?Keep out of the reach of children. ?Store in a refrigerator between 2 and 8 degrees C (36 and 46 degrees F). Do not freeze. Keep in carton to protect from light. Throw away this medicine if vials or syringes are left out of the refrigerator for more than 24 hours. Throw away any unused medicine after the expiration date. ?NOTE: This sheet is a summary. It may not cover all possible information. If you have questions about this medicine, talk to your doctor, pharmacist, or health care provider. ?? 2023 Elsevier/Gold Standard (2021-04-18 00:00:00) ? ?

## 2021-10-08 ENCOUNTER — Inpatient Hospital Stay: Payer: PRIVATE HEALTH INSURANCE

## 2021-10-08 VITALS — BP 119/81 | HR 97 | Temp 98.0°F | Resp 20 | Ht 68.0 in | Wt 181.8 lb

## 2021-10-08 DIAGNOSIS — C182 Malignant neoplasm of ascending colon: Secondary | ICD-10-CM

## 2021-10-08 DIAGNOSIS — Z5111 Encounter for antineoplastic chemotherapy: Secondary | ICD-10-CM | POA: Diagnosis not present

## 2021-10-08 MED ORDER — FILGRASTIM-SNDZ 480 MCG/0.8ML IJ SOSY
480.0000 ug | PREFILLED_SYRINGE | Freq: Once | INTRAMUSCULAR | Status: AC
  Administered 2021-10-08: 480 ug via SUBCUTANEOUS
  Filled 2021-10-08: qty 0.8

## 2021-10-08 NOTE — Patient Instructions (Signed)
Filgrastim, G-CSF injection ?What is this medication? ?FILGRASTIM, G-CSF (fil GRA stim) is a granulocyte colony-stimulating factor that stimulates the growth of neutrophils, a type of white blood cell (WBC) important in the body's fight against infection. It is used to reduce the incidence of fever and infection in patients with certain types of cancer who are receiving chemotherapy that affects the bone marrow, to stimulate blood cell production for removal of WBCs from the body prior to a bone marrow transplantation, to reduce the incidence of fever and infection in patients who have severe chronic neutropenia, and to improve survival outcomes following high-dose radiation exposure that is toxic to the bone marrow. ?This medicine may be used for other purposes; ask your health care provider or pharmacist if you have questions. ?COMMON BRAND NAME(S): Neupogen, Nivestym, Releuko, Zarxio ?What should I tell my care team before I take this medication? ?They need to know if you have any of these conditions: ?kidney disease ?latex allergy ?ongoing radiation therapy ?sickle cell disease ?an unusual or allergic reaction to filgrastim, pegfilgrastim, other medicines, foods, dyes, or preservatives ?pregnant or trying to get pregnant ?breast-feeding ?How should I use this medication? ?This medicine is for injection under the skin or infusion into a vein. As an infusion into a vein, it is usually given by a health care professional in a hospital or clinic setting. If you get this medicine at home, you will be taught how to prepare and give this medicine. Refer to the Instructions for Use that come with your medication packaging. Use exactly as directed. Take your medicine at regular intervals. Do not take your medicine more often than directed. ?It is important that you put your used needles and syringes in a special sharps container. Do not put them in a trash can. If you do not have a sharps container, call your pharmacist  or healthcare provider to get one. ?Talk to your pediatrician regarding the use of this medicine in children. While this drug may be prescribed for children as young as 7 months for selected conditions, precautions do apply. ?Overdosage: If you think you have taken too much of this medicine contact a poison control center or emergency room at once. ?NOTE: This medicine is only for you. Do not share this medicine with others. ?What if I miss a dose? ?It is important not to miss your dose. Call your doctor or health care professional if you miss a dose. ?What may interact with this medication? ?This medicine may interact with the following medications: ?medicines that may cause a release of neutrophils, such as lithium ?This list may not describe all possible interactions. Give your health care provider a list of all the medicines, herbs, non-prescription drugs, or dietary supplements you use. Also tell them if you smoke, drink alcohol, or use illegal drugs. Some items may interact with your medicine. ?What should I watch for while using this medication? ?Your condition will be monitored carefully while you are receiving this medicine. ?You may need blood work done while you are taking this medicine. ?Talk to your health care provider about your risk of cancer. You may be more at risk for certain types of cancer if you take this medicine. ?What side effects may I notice from receiving this medication? ?Side effects that you should report to your doctor or health care professional as soon as possible: ?allergic reactions like skin rash, itching or hives, swelling of the face, lips, or tongue ?back pain ?dizziness or feeling faint ?fever ?pain, redness, or   irritation at site where injected ?pinpoint red spots on the skin ?shortness of breath or breathing problems ?signs and symptoms of kidney injury like trouble passing urine, change in the amount of urine, or red or dark-brown urine ?stomach or side pain, or pain at  the shoulder ?swelling ?tiredness ?unusual bleeding or bruising ?Side effects that usually do not require medical attention (report to your doctor or health care professional if they continue or are bothersome): ?bone pain ?cough ?diarrhea ?hair loss ?headache ?muscle pain ?This list may not describe all possible side effects. Call your doctor for medical advice about side effects. You may report side effects to FDA at 1-800-FDA-1088. ?Where should I keep my medication? ?Keep out of the reach of children. ?Store in a refrigerator between 2 and 8 degrees C (36 and 46 degrees F). Do not freeze. Keep in carton to protect from light. Throw away this medicine if vials or syringes are left out of the refrigerator for more than 24 hours. Throw away any unused medicine after the expiration date. ?NOTE: This sheet is a summary. It may not cover all possible information. If you have questions about this medicine, talk to your doctor, pharmacist, or health care provider. ?? 2023 Elsevier/Gold Standard (2021-04-18 00:00:00) ? ?

## 2021-10-09 ENCOUNTER — Ambulatory Visit: Payer: PRIVATE HEALTH INSURANCE

## 2021-10-09 ENCOUNTER — Other Ambulatory Visit: Payer: Self-pay | Admitting: Hematology and Oncology

## 2021-10-09 DIAGNOSIS — C182 Malignant neoplasm of ascending colon: Secondary | ICD-10-CM

## 2021-10-10 ENCOUNTER — Other Ambulatory Visit: Payer: PRIVATE HEALTH INSURANCE

## 2021-10-10 ENCOUNTER — Inpatient Hospital Stay: Payer: PRIVATE HEALTH INSURANCE | Admitting: Hematology and Oncology

## 2021-10-10 ENCOUNTER — Ambulatory Visit: Payer: PRIVATE HEALTH INSURANCE | Admitting: Oncology

## 2021-10-10 ENCOUNTER — Ambulatory Visit: Payer: PRIVATE HEALTH INSURANCE

## 2021-10-10 MED FILL — Bevacizumab-bvzr IV Soln 400 MG/16ML (For Infusion): INTRAVENOUS | Qty: 16 | Status: AC

## 2021-10-10 MED FILL — Irinotecan HCl Inj 100 MG/5ML (20 MG/ML): INTRAVENOUS | Qty: 14 | Status: AC

## 2021-10-13 ENCOUNTER — Encounter: Payer: PRIVATE HEALTH INSURANCE | Admitting: Psychology

## 2021-10-13 ENCOUNTER — Ambulatory Visit: Payer: PRIVATE HEALTH INSURANCE

## 2021-10-15 ENCOUNTER — Other Ambulatory Visit: Payer: Self-pay | Admitting: Psychiatry

## 2021-10-15 DIAGNOSIS — F422 Mixed obsessional thoughts and acts: Secondary | ICD-10-CM

## 2021-10-15 DIAGNOSIS — F333 Major depressive disorder, recurrent, severe with psychotic symptoms: Secondary | ICD-10-CM

## 2021-10-15 NOTE — Telephone Encounter (Signed)
Brand medically necessary still ?  ?

## 2021-10-20 ENCOUNTER — Ambulatory Visit: Payer: PRIVATE HEALTH INSURANCE

## 2021-10-20 ENCOUNTER — Encounter: Payer: Self-pay | Admitting: Oncology

## 2021-10-21 ENCOUNTER — Ambulatory Visit: Payer: PRIVATE HEALTH INSURANCE

## 2021-10-22 ENCOUNTER — Ambulatory Visit: Payer: PRIVATE HEALTH INSURANCE

## 2021-10-23 NOTE — Progress Notes (Signed)
[facility]  [address] [place],  [place]  [address] [phone]  Clinic Day:  10/24/2021  Referring physician: Greig Right, MD  HISTORY OF PRESENT ILLNESS:  The patient is a 56 y.o. female with metastatic colon cancer, which includes spread of disease to her abdominal cavity, lungs, brain and right adrenal gland. She comes in today prior to her 29th cycle of palliative FOLFIRI/Avastin chemotherapy.  The patient claims to have tolerated her 28th cycle of treatment fairly well.  She has intermittent diarrhea and constipation over these past weeks, but the symptoms have not caused her any significant alteration in her daily quality of life.  She also denies having any new symptoms or findings which concern her overt signs of disease progression.     With respect to her colon cancer history, she is status post a right hemicolectomy in early October 2018, followed by 12 cycles of adjuvant FOLFOX chemotherapy, which were completed in April 2019.  In December 2021, she underwent a left cerebellar metastatectomy, whose pathology was consistent with metastatic colon cancer.  Tumor testing did come back MMR normal.  CT scans recently showed evidence of her cancer being in multiple locations, which has her on palliative chemotherapy at this time.    PHYSICAL EXAM:  Blood pressure 112/73, pulse 98, temperature 97.8 F (36.6 C), resp. rate 14, height 5' 8"  (1.727 m), weight 178 lb 1.6 oz (80.8 kg), last menstrual period 02/22/2019, SpO2 97 %. Wt Readings from Last 3 Encounters:  10/29/21 178 lb (80.7 kg)  10/24/21 178 lb 1.6 oz (80.8 kg)  10/08/21 181 lb 12 oz (82.4 kg)   Body mass index is 27.08 kg/m. Performance status (ECOG): 1 Physical Exam Constitutional:      General: She is not in acute distress.    Appearance: Normal appearance. She is normal weight. She is not ill-appearing.  HENT:     Head: Normocephalic and atraumatic.  Eyes:     General: No  scleral icterus.    Extraocular Movements: Extraocular movements intact.     Conjunctiva/sclera: Conjunctivae normal.     Pupils: Pupils are equal, round, and reactive to light.  Cardiovascular:     Rate and Rhythm: Normal rate and regular rhythm.     Pulses: Normal pulses.     Heart sounds: Normal heart sounds. No murmur heard.   No friction rub. No gallop.  Pulmonary:     Effort: Pulmonary effort is normal. No respiratory distress.     Breath sounds: Normal breath sounds.  Abdominal:     General: Bowel sounds are normal. There is no distension.     Palpations: Abdomen is soft. There is no hepatomegaly, splenomegaly or mass.     Tenderness: There is no abdominal tenderness.  Musculoskeletal:        General: Normal range of motion.     Cervical back: Normal range of motion and neck supple.     Right lower leg: No edema.     Left lower leg: No edema.  Lymphadenopathy:     Cervical: No cervical adenopathy.  Skin:    General: Skin is warm and dry.  Neurological:     General: No focal deficit present.     Mental Status: She is alert and oriented to person, place, and time. Mental status is at baseline.  Psychiatric:        Mood and Affect: Mood normal.        Behavior: Behavior normal.  Thought Content: Thought content normal.        Judgment: Judgment normal.    LABS:    Latest Reference Range & Units 10/24/21 00:00  Sodium 137 - 147  140 (E)  Potassium 3.5 - 5.1 mEq/L 4.1 (E)  Chloride 99 - 108  105 (E)  CO2 13 - 22  24 ! (E)  Glucose  213 (E)  BUN 4 - 21  17 (E)  Creatinine 0.5 - 1.1  1.3 ! (E)  Calcium 8.7 - 10.7  9.1 (E)  Alkaline Phosphatase 25 - 125  127 ! (E)  Albumin 3.5 - 5.0  4.0 (E)  AST 13 - 35  24 (E)  ALT 7 - 35 U/L 24 (E)  Bilirubin, Total  0.5 (E)  !: Data is abnormal (E): External lab result  Latest Reference Range & Units 10/24/21 00:00  WBC  3.4 (E)  RBC 3.87 - 5.11  3.89 (E)  Hemoglobin 12.0 - 16.0  12.2 (E)  HCT 36 - 46  38 (E)   Platelets 150 - 400 K/uL 144 ! (E)  NEUT#  1.97 (E)  !: Data is abnormal (E): External lab result  ASSESSMENT & PLAN:  Assessment/Plan:  A 56 y.o. female with metastatic colon cancer.  She will proceed with her 29th cycle of FOLFIRI/Avastin next week, which is being given every 3 weeks.  Clinically, she is doing well.  I will see her back in 3 weeks before she heads into her 30th cycle of FOLFIRI/Avastin. The patient understands all the plans discussed today and is in agreement with them.     Ruthia Person Macarthur Critchley, MD

## 2021-10-24 ENCOUNTER — Inpatient Hospital Stay (INDEPENDENT_AMBULATORY_CARE_PROVIDER_SITE_OTHER): Payer: PRIVATE HEALTH INSURANCE | Admitting: Oncology

## 2021-10-24 ENCOUNTER — Inpatient Hospital Stay: Payer: PRIVATE HEALTH INSURANCE

## 2021-10-24 ENCOUNTER — Ambulatory Visit: Payer: PRIVATE HEALTH INSURANCE

## 2021-10-24 ENCOUNTER — Other Ambulatory Visit: Payer: Self-pay | Admitting: Radiation Therapy

## 2021-10-24 VITALS — BP 112/73 | HR 98 | Temp 97.8°F | Resp 14 | Ht 68.0 in | Wt 178.1 lb

## 2021-10-24 DIAGNOSIS — Z5111 Encounter for antineoplastic chemotherapy: Secondary | ICD-10-CM | POA: Diagnosis not present

## 2021-10-24 DIAGNOSIS — C183 Malignant neoplasm of hepatic flexure: Secondary | ICD-10-CM

## 2021-10-24 DIAGNOSIS — C182 Malignant neoplasm of ascending colon: Secondary | ICD-10-CM

## 2021-10-24 DIAGNOSIS — C184 Malignant neoplasm of transverse colon: Secondary | ICD-10-CM | POA: Diagnosis not present

## 2021-10-24 DIAGNOSIS — C7931 Secondary malignant neoplasm of brain: Secondary | ICD-10-CM

## 2021-10-24 LAB — BASIC METABOLIC PANEL
BUN: 17 (ref 4–21)
CO2: 24 — AB (ref 13–22)
Chloride: 105 (ref 99–108)
Creatinine: 1.3 — AB (ref 0.5–1.1)
Glucose: 213
Potassium: 4.1 mEq/L (ref 3.5–5.1)
Sodium: 140 (ref 137–147)

## 2021-10-24 LAB — CBC AND DIFFERENTIAL
HCT: 38 (ref 36–46)
Hemoglobin: 12.2 (ref 12.0–16.0)
Neutrophils Absolute: 1.97
Platelets: 144 10*3/uL — AB (ref 150–400)
WBC: 3.4

## 2021-10-24 LAB — HEPATIC FUNCTION PANEL
ALT: 24 U/L (ref 7–35)
AST: 24 (ref 13–35)
Alkaline Phosphatase: 127 — AB (ref 25–125)
Bilirubin, Total: 0.5

## 2021-10-24 LAB — COMPREHENSIVE METABOLIC PANEL
Albumin: 4 (ref 3.5–5.0)
Calcium: 9.1 (ref 8.7–10.7)

## 2021-10-24 LAB — TOTAL PROTEIN, URINE DIPSTICK: Protein, ur: NEGATIVE mg/dL

## 2021-10-24 LAB — CBC: RBC: 3.89 (ref 3.87–5.11)

## 2021-10-28 MED FILL — Fluorouracil IV Soln 5 GM/100ML (50 MG/ML): INTRAVENOUS | Qty: 100 | Status: AC

## 2021-10-28 MED FILL — Leucovorin Calcium For Inj 350 MG: INTRAMUSCULAR | Qty: 41.8 | Status: AC

## 2021-10-28 MED FILL — Bevacizumab-bvzr IV Soln 400 MG/16ML (For Infusion): INTRAVENOUS | Qty: 16 | Status: AC

## 2021-10-28 MED FILL — Irinotecan HCl Inj 100 MG/5ML (20 MG/ML): INTRAVENOUS | Qty: 14 | Status: AC

## 2021-10-28 MED FILL — Fluorouracil IV Soln 2.5 GM/50ML (50 MG/ML): INTRAVENOUS | Qty: 17 | Status: AC

## 2021-10-29 ENCOUNTER — Inpatient Hospital Stay: Payer: PRIVATE HEALTH INSURANCE

## 2021-10-29 ENCOUNTER — Telehealth: Payer: Self-pay

## 2021-10-29 VITALS — BP 110/68 | HR 87 | Temp 97.4°F | Resp 16 | Wt 178.0 lb

## 2021-10-29 DIAGNOSIS — Z5111 Encounter for antineoplastic chemotherapy: Secondary | ICD-10-CM | POA: Diagnosis not present

## 2021-10-29 DIAGNOSIS — C182 Malignant neoplasm of ascending colon: Secondary | ICD-10-CM

## 2021-10-29 MED ORDER — PALONOSETRON HCL INJECTION 0.25 MG/5ML
0.2500 mg | Freq: Once | INTRAVENOUS | Status: AC
  Administered 2021-10-29: 0.25 mg via INTRAVENOUS
  Filled 2021-10-29: qty 5

## 2021-10-29 MED ORDER — SODIUM CHLORIDE 0.9 % IV SOLN
2400.0000 mg/m2 | INTRAVENOUS | Status: DC
  Administered 2021-10-29: 5000 mg via INTRAVENOUS
  Filled 2021-10-29: qty 100

## 2021-10-29 MED ORDER — DIPHENHYDRAMINE HCL 50 MG/ML IJ SOLN
INTRAMUSCULAR | Status: AC
  Filled 2021-10-29: qty 1

## 2021-10-29 MED ORDER — LEUCOVORIN CALCIUM INJECTION 350 MG
400.0000 mg/m2 | Freq: Once | INTRAVENOUS | Status: AC
  Administered 2021-10-29: 836 mg via INTRAVENOUS
  Filled 2021-10-29: qty 41.8

## 2021-10-29 MED ORDER — SODIUM CHLORIDE 0.9% FLUSH: 10.0000 mL | INTRAVENOUS | Status: DC | PRN

## 2021-10-29 MED ORDER — SODIUM CHLORIDE 0.9 % IV SOLN
150.0000 mg | Freq: Once | INTRAVENOUS | Status: AC
  Administered 2021-10-29: 150 mg via INTRAVENOUS
  Filled 2021-10-29: qty 150

## 2021-10-29 MED ORDER — SODIUM CHLORIDE 0.9 % IV SOLN: Freq: Once | INTRAVENOUS | Status: AC

## 2021-10-29 MED ORDER — IRINOTECAN HCL CHEMO INJECTION 100 MG/5ML
135.0000 mg/m2 | Freq: Once | INTRAVENOUS | Status: AC
  Administered 2021-10-29: 280 mg via INTRAVENOUS
  Filled 2021-10-29: qty 10

## 2021-10-29 MED ORDER — HEPARIN SOD (PORK) LOCK FLUSH 100 UNIT/ML IV SOLN: 500.0000 [IU] | Freq: Once | INTRAVENOUS | Status: DC | PRN

## 2021-10-29 MED ORDER — FLUOROURACIL CHEMO INJECTION 2.5 GM/50ML
400.0000 mg/m2 | Freq: Once | INTRAVENOUS | Status: AC
  Administered 2021-10-29: 850 mg via INTRAVENOUS
  Filled 2021-10-29: qty 17

## 2021-10-29 MED ORDER — ATROPINE SULFATE 1 MG/ML IV SOLN
0.5000 mg | Freq: Once | INTRAVENOUS | Status: AC | PRN
  Administered 2021-10-29: 0.5 mg via INTRAVENOUS
  Filled 2021-10-29: qty 1

## 2021-10-29 MED ORDER — SODIUM CHLORIDE 0.9 % IV SOLN
10.0000 mg | Freq: Once | INTRAVENOUS | Status: AC
  Administered 2021-10-29: 10 mg via INTRAVENOUS
  Filled 2021-10-29: qty 10

## 2021-10-29 MED ORDER — FAMOTIDINE IN NACL 20-0.9 MG/50ML-% IV SOLN
20.0000 mg | Freq: Once | INTRAVENOUS | Status: AC
  Administered 2021-10-29: 20 mg via INTRAVENOUS

## 2021-10-29 MED ORDER — DIPHENHYDRAMINE HCL 50 MG/ML IJ SOLN
25.0000 mg | Freq: Once | INTRAMUSCULAR | Status: AC
  Administered 2021-10-29: 25 mg via INTRAVENOUS

## 2021-10-29 MED ORDER — SODIUM CHLORIDE 0.9 % IV SOLN
5.0000 mg/kg | Freq: Once | INTRAVENOUS | Status: AC
  Administered 2021-10-29: 400 mg via INTRAVENOUS
  Filled 2021-10-29: qty 16

## 2021-10-29 NOTE — Progress Notes (Signed)
Hypersensitivity Reaction note  Date of event: 10/29/21 Time of event: 1325   Generic name of drug involved: Leucovorin or Irinotecan Name of provider notified of the hypersensitivity reaction: Dayton Scrape NP Was agent that likely caused hypersensitivity reaction added to Allergies List within EMR? Yes Chain of events including reaction signs/symptoms, treatment administered, and outcome (e.g., drug resumed; drug discontinued; sent to Emergency Department; etc.) patient had flushing to face and ears noted at approximately 1300, flushing continued until 1325 when patient developed whelp like red rash to hands, face, ears and chest during line flush following Irinotecan and Leucovorin. Patient denies itching. Reports feeling "hot". No shortness of breath, No abd cramps, No itching. Symptoms improved after Pepcid 20 IVPB and Benadryl 25 IVSP. Fluorouracil pump attached for home use/   Maxwell Marion, RN 10/29/2021 2:36 PM

## 2021-10-29 NOTE — Telephone Encounter (Signed)
Patient states that her face still feels hot but is much less red. Denies any further symptoms.

## 2021-10-29 NOTE — Progress Notes (Signed)
1322- Patient face flushed with raised "whelp like" rash also noted to chest, ears and face- Dayton Scrape NP, Penelope Galas Charge RN and Clayton Bibles PharmD notified. Orders received.

## 2021-10-29 NOTE — Progress Notes (Signed)
Face, chest and hands remain red with raised area to forehead- patient states that her face feels "hot".  Area are less red than earlier. Denies itching, abd cramps or any other symptom. Respirations easy and unlabored. Dayton Scrape NP notified and patient instructed to take more Benadryl, as directed on bottle, when she arrives home

## 2021-10-29 NOTE — Patient Instructions (Signed)
[facility]  Discharge Instructions: Thank you for choosing [facility] to provide your oncology and hematology care.  If you have a lab appointment with the [facility], please go directly to the [facility] and check in at the registration area.   Wear comfortable clothing and clothing appropriate for easy access to any Portacath or PICC line.   We strive to give you quality time with your provider. You may need to reschedule your appointment if you arrive late (15 or more minutes).  Arriving late affects you and other patients whose appointments are after yours.  Also, if you miss three or more appointments without notifying the office, you may be dismissed from the clinic at the provider's discretion.      For prescription refill requests, have your pharmacy contact our office and allow 72 hours for refills to be completed.    Today you received the following chemotherapy and/or immunotherapy agents: Irinotecan, Leucovorin, Fluorouracil  The chemotherapy medication bag should finish at 46 hours.Fluorouracil, 5-FU injection What is this medication? FLUOROURACIL, 5-FU (flure oh YOOR a sil) is a chemotherapy drug. It slows the growth of cancer cells. This medicine is used to treat many types of cancer like breast cancer, colon or rectal cancer, pancreatic cancer, and stomach cancer. This medicine may be used for other purposes; ask your health care provider or pharmacist if you have questions. COMMON BRAND NAME(S): Adrucil What should I tell my care team before I take this medication? They need to know if you have any of these conditions: blood disorders dihydropyrimidine dehydrogenase (DPD) deficiency infection (especially a virus infection such as chickenpox, cold sores, or herpes) kidney disease liver disease malnourished, poor nutrition recent or ongoing radiation therapy an unusual or allergic reaction to fluorouracil, other chemotherapy,  other medicines, foods, dyes, or preservatives pregnant or trying to get pregnant breast-feeding How should I use this medication? This drug is given as an infusion or injection into a vein. It is administered in a hospital or clinic by a specially trained health care professional. Talk to your pediatrician regarding the use of this medicine in children. Special care may be needed. Overdosage: If you think you have taken too much of this medicine contact a poison control center or emergency room at once. NOTE: This medicine is only for you. Do not share this medicine with others. What if I miss a dose? It is important not to miss your dose. Call your doctor or health care professional if you are unable to keep an appointment. What may interact with this medication? Do not take this medicine with any of the following medications: live virus vaccines This medicine may also interact with the following medications: medicines that treat or prevent blood clots like warfarin, enoxaparin, and dalteparin This list may not describe all possible interactions. Give your health care provider a list of all the medicines, herbs, non-prescription drugs, or dietary supplements you use. Also tell them if you smoke, drink alcohol, or use illegal drugs. Some items may interact with your medicine. What should I watch for while using this medication? Visit your doctor for checks on your progress. This drug may make you feel generally unwell. This is not uncommon, as chemotherapy can affect healthy cells as well as cancer cells. Report any side effects. Continue your course of treatment even though you feel ill unless your doctor tells you to stop. In some cases, you may be given additional medicines to help with side  effects. Follow all directions for their use. Call your doctor or health care professional for advice if you get a fever, chills or sore throat, or other symptoms of a cold or flu. Do not treat yourself.  This drug decreases your body's ability to fight infections. Try to avoid being around people who are sick. This medicine may increase your risk to bruise or bleed. Call your doctor or health care professional if you notice any unusual bleeding. Be careful brushing and flossing your teeth or using a toothpick because you may get an infection or bleed more easily. If you have any dental work done, tell your dentist you are receiving this medicine. Avoid taking products that contain aspirin, acetaminophen, ibuprofen, naproxen, or ketoprofen unless instructed by your doctor. These medicines may hide a fever. Do not become pregnant while taking this medicine. Women should inform their doctor if they wish to become pregnant or think they might be pregnant. There is a potential for serious side effects to an unborn child. Talk to your health care professional or pharmacist for more information. Do not breast-feed an infant while taking this medicine. Men should inform their doctor if they wish to father a child. This medicine may lower sperm counts. Do not treat diarrhea with over the counter products. Contact your doctor if you have diarrhea that lasts more than 2 days or if it is severe and watery. This medicine can make you more sensitive to the sun. Keep out of the sun. If you cannot avoid being in the sun, wear protective clothing and use sunscreen. Do not use sun lamps or tanning beds/booths. What side effects may I notice from receiving this medication? Side effects that you should report to your doctor or health care professional as soon as possible: allergic reactions like skin rash, itching or hives, swelling of the face, lips, or tongue low blood counts - this medicine may decrease the number of white blood cells, red blood cells and platelets. You may be at increased risk for infections and bleeding. signs of infection - fever or chills, cough, sore throat, pain or difficulty passing urine signs  of decreased platelets or bleeding - bruising, pinpoint red spots on the skin, black, tarry stools, blood in the urine signs of decreased red blood cells - unusually weak or tired, fainting spells, lightheadedness breathing problems changes in vision chest pain mouth sores nausea and vomiting pain, swelling, redness at site where injected pain, tingling, numbness in the hands or feet redness, swelling, or sores on hands or feet stomach pain unusual bleeding Side effects that usually do not require medical attention (report to your doctor or health care professional if they continue or are bothersome): changes in finger or toe nails diarrhea dry or itchy skin hair loss headache loss of appetite sensitivity of eyes to the light stomach upset unusually teary eyes This list may not describe all possible side effects. Call your doctor for medical advice about side effects. You may report side effects to FDA at 1-800-FDA-1088. Where should I keep my medication? This drug is given in a hospital or clinic and will not be stored at home. NOTE: This sheet is a summary. It may not cover all possible information. If you have questions about this medicine, talk to your doctor, pharmacist, or health care provider.  2023 Elsevier/Gold Standard (2021-04-18 00:00:00) Leucovorin injection What is this medication? LEUCOVORIN (loo koe VOR in) is used to prevent or treat the harmful effects of some medicines. This medicine is  used to treat anemia caused by a low amount of folic acid in the body. It is also used with 5-fluorouracil (5-FU) to treat colon cancer. This medicine may be used for other purposes; ask your health care provider or pharmacist if you have questions. What should I tell my care team before I take this medication? They need to know if you have any of these conditions: anemia from low levels of vitamin B-12 in the blood an unusual or allergic reaction to leucovorin, folic acid, other  medicines, foods, dyes, or preservatives pregnant or trying to get pregnant breast-feeding How should I use this medication? This medicine is for injection into a muscle or into a vein. It is given by a health care professional in a hospital or clinic setting. Talk to your pediatrician regarding the use of this medicine in children. Special care may be needed. Overdosage: If you think you have taken too much of this medicine contact a poison control center or emergency room at once. NOTE: This medicine is only for you. Do not share this medicine with others. What if I miss a dose? This does not apply. What may interact with this medication? capecitabine fluorouracil phenobarbital phenytoin primidone trimethoprim-sulfamethoxazole This list may not describe all possible interactions. Give your health care provider a list of all the medicines, herbs, non-prescription drugs, or dietary supplements you use. Also tell them if you smoke, drink alcohol, or use illegal drugs. Some items may interact with your medicine. What should I watch for while using this medication? Your condition will be monitored carefully while you are receiving this medicine. This medicine may increase the side effects of 5-fluorouracil, 5-FU. Tell your doctor or health care professional if you have diarrhea or mouth sores that do not get better or that get worse. What side effects may I notice from receiving this medication? Side effects that you should report to your doctor or health care professional as soon as possible: allergic reactions like skin rash, itching or hives, swelling of the face, lips, or tongue breathing problems fever, infection mouth sores unusual bleeding or bruising unusually weak or tired Side effects that usually do not require medical attention (report to your doctor or health care professional if they continue or are bothersome): constipation or diarrhea loss of appetite nausea,  vomiting This list may not describe all possible side effects. Call your doctor for medical advice about side effects. You may report side effects to FDA at 1-800-FDA-1088. Where should I keep my medication? This drug is given in a hospital or clinic and will not be stored at home. NOTE: This sheet is a summary. It may not cover all possible information. If you have questions about this medicine, talk to your doctor, pharmacist, or health care provider.  2023 Elsevier/Gold Standard (2007-11-24 00:00:00) Irinotecan injection What is this medication? IRINOTECAN (ir in oh TEE kan ) is a chemotherapy drug. It is used to treat colon and rectal cancer. This medicine may be used for other purposes; ask your health care provider or pharmacist if you have questions. COMMON BRAND NAME(S): Camptosar What should I tell my care team before I take this medication? They need to know if you have any of these conditions: dehydration diarrhea infection (especially a virus infection such as chickenpox, cold sores, or herpes) liver disease low blood counts, like low white cell, platelet, or red cell counts low levels of calcium, magnesium, or potassium in the blood recent or ongoing radiation therapy an unusual or allergic reaction  to irinotecan, other medicines, foods, dyes, or preservatives pregnant or trying to get pregnant breast-feeding How should I use this medication? This drug is given as an infusion into a vein. It is administered in a hospital or clinic by a specially trained health care professional. Talk to your pediatrician regarding the use of this medicine in children. Special care may be needed. Overdosage: If you think you have taken too much of this medicine contact a poison control center or emergency room at once. NOTE: This medicine is only for you. Do not share this medicine with others. What if I miss a dose? It is important not to miss your dose. Call your doctor or health care  professional if you are unable to keep an appointment. What may interact with this medication? Do not take this medicine with any of the following medications: cobicistat itraconazole This medicine may interact with the following medications: antiviral medicines for HIV or AIDS certain antibiotics like rifampin or rifabutin certain medicines for fungal infections like ketoconazole, posaconazole, and voriconazole certain medicines for seizures like carbamazepine, phenobarbital, phenotoin clarithromycin gemfibrozil nefazodone St. John's Wort This list may not describe all possible interactions. Give your health care provider a list of all the medicines, herbs, non-prescription drugs, or dietary supplements you use. Also tell them if you smoke, drink alcohol, or use illegal drugs. Some items may interact with your medicine. What should I watch for while using this medication? Your condition will be monitored carefully while you are receiving this medicine. You will need important blood work done while you are taking this medicine. This drug may make you feel generally unwell. This is not uncommon, as chemotherapy can affect healthy cells as well as cancer cells. Report any side effects. Continue your course of treatment even though you feel ill unless your doctor tells you to stop. In some cases, you may be given additional medicines to help with side effects. Follow all directions for their use. You may get drowsy or dizzy. Do not drive, use machinery, or do anything that needs mental alertness until you know how this medicine affects you. Do not stand or sit up quickly, especially if you are an older patient. This reduces the risk of dizzy or fainting spells. Call your health care professional for advice if you get a fever, chills, or sore throat, or other symptoms of a cold or flu. Do not treat yourself. This medicine decreases your body's ability to fight infections. Try to avoid being around  people who are sick. Avoid taking products that contain aspirin, acetaminophen, ibuprofen, naproxen, or ketoprofen unless instructed by your doctor. These medicines may hide a fever. This medicine may increase your risk to bruise or bleed. Call your doctor or health care professional if you notice any unusual bleeding. Be careful brushing and flossing your teeth or using a toothpick because you may get an infection or bleed more easily. If you have any dental work done, tell your dentist you are receiving this medicine. Do not become pregnant while taking this medicine or for 6 months after stopping it. Women should inform their health care professional if they wish to become pregnant or think they might be pregnant. Men should not father a child while taking this medicine and for 3 months after stopping it. There is potential for serious side effects to an unborn child. Talk to your health care professional for more information. Do not breast-feed an infant while taking this medicine or for 7 days after stopping it.  This medicine has caused ovarian failure in some women. This medicine may make it more difficult to get pregnant. Talk to your health care professional if you are concerned about your fertility. This medicine has caused decreased sperm counts in some men. This may make it more difficult to father a child. Talk to your health care professional if you are concerned about your fertility. What side effects may I notice from receiving this medication? Side effects that you should report to your doctor or health care professional as soon as possible: allergic reactions like skin rash, itching or hives, swelling of the face, lips, or tongue chest pain diarrhea flushing, runny nose, sweating during infusion low blood counts - this medicine may decrease the number of white blood cells, red blood cells and platelets. You may be at increased risk for infections and bleeding. nausea, vomiting pain,  swelling, warmth in the leg signs of decreased platelets or bleeding - bruising, pinpoint red spots on the skin, black, tarry stools, blood in the urine signs of infection - fever or chills, cough, sore throat, pain or difficulty passing urine signs of decreased red blood cells - unusually weak or tired, fainting spells, lightheadedness Side effects that usually do not require medical attention (report to your doctor or health care professional if they continue or are bothersome): constipation hair loss headache loss of appetite mouth sores stomach pain This list may not describe all possible side effects. Call your doctor for medical advice about side effects. You may report side effects to FDA at 1-800-FDA-1088. Where should I keep my medication? This drug is given in a hospital or clinic and will not be stored at home. NOTE: This sheet is a summary. It may not cover all possible information. If you have questions about this medicine, talk to your doctor, pharmacist, or health care provider.  2023 Elsevier/Gold Standard (2021-04-18 00:00:00)  For example, if your pump is scheduled for 46 hours and it was put on at 4:00 p.m., it should finish at 2:00 p.m. the day it is scheduled to come off regardless of your appointment time.     Estimated time to finish at 1:00 pm.   If the display on your pump reads "Low Volume" and it is beeping, take the batteries out of the pump and come to the cancer center for it to be taken off.   If the pump alarms go off prior to the pump reading "Low Volume" then call [phone] and someone can assist you.  If the plunger comes out and the chemotherapy medication is leaking out, please use your home chemo spill kit to clean up the spill. Do NOT use paper towels or other household products.  If you have problems or questions regarding your pump, please call either [phone] (24 hours a day) or the cancer center Monday-Friday 8:00 a.m.- 4:30 p.m. at the  clinic number and we will assist you. If you are unable to get assistance, then go to the nearest Emergency Department and ask the staff to contact the IV team for assistance.        To help prevent nausea and vomiting after your treatment, we encourage you to take your nausea medication as directed.  BELOW ARE SYMPTOMS THAT SHOULD BE REPORTED IMMEDIATELY: *FEVER GREATER THAN 100.4 F (38 C) OR HIGHER *CHILLS OR SWEATING *NAUSEA AND VOMITING THAT IS NOT CONTROLLED WITH YOUR NAUSEA MEDICATION *UNUSUAL SHORTNESS OF BREATH *UNUSUAL BRUISING OR BLEEDING *URINARY PROBLEMS (pain or burning when urinating, or frequent urination) *  BOWEL PROBLEMS (unusual diarrhea, constipation, pain near the anus) TENDERNESS IN MOUTH AND THROAT WITH OR WITHOUT PRESENCE OF ULCERS (sore throat, sores in mouth, or a toothache) UNUSUAL RASH, SWELLING OR PAIN  UNUSUAL VAGINAL DISCHARGE OR ITCHING   Items with * indicate a potential emergency and should be followed up as soon as possible or go to the Emergency Department if any problems should occur.  Please show the CHEMOTHERAPY ALERT CARD or IMMUNOTHERAPY ALERT CARD at check-in to the Emergency Department and triage nurse.  Should you have questions after your visit or need to cancel or reschedule your appointment, please contact [facility]  Dept: [phone]  and follow the prompts.  Office hours are 8:00 a.m. to 4:30 p.m. Monday - Friday. Please note that voicemails left after 4:00 p.m. may not be returned until the following business day.  We are closed weekends and major holidays. You have access to a nurse at all times for urgent questions. Please call the main number to the clinic Dept: [phone] and follow the prompts.  For any non-urgent questions, you may also contact your provider using MyChart. We now offer e-Visits for anyone 19 and older to request care online for non-urgent symptoms. For details visit [email or web address].    Also download the MyChart app! Go to the app store, search "MyChart", open the app, select [facility], and log in with your MyChart username and password.  Due to Covid, a mask is required upon entering the hospital/clinic. If you do not have a mask, one will be given to you upon arrival. For doctor visits, patients may have 1 support person aged 44 or older with them. For treatment visits, patients cannot have anyone with them due to current Covid guidelines and our immunocompromised population.

## 2021-10-30 ENCOUNTER — Telehealth: Payer: Self-pay

## 2021-10-30 ENCOUNTER — Encounter: Payer: Self-pay | Admitting: Psychiatry

## 2021-10-30 ENCOUNTER — Ambulatory Visit (INDEPENDENT_AMBULATORY_CARE_PROVIDER_SITE_OTHER): Payer: PRIVATE HEALTH INSURANCE | Admitting: Psychiatry

## 2021-10-30 DIAGNOSIS — F422 Mixed obsessional thoughts and acts: Secondary | ICD-10-CM

## 2021-10-30 DIAGNOSIS — G4721 Circadian rhythm sleep disorder, delayed sleep phase type: Secondary | ICD-10-CM

## 2021-10-30 DIAGNOSIS — F333 Major depressive disorder, recurrent, severe with psychotic symptoms: Secondary | ICD-10-CM | POA: Diagnosis not present

## 2021-10-30 DIAGNOSIS — G3184 Mild cognitive impairment, so stated: Secondary | ICD-10-CM

## 2021-10-30 DIAGNOSIS — R530 Neoplastic (malignant) related fatigue: Secondary | ICD-10-CM

## 2021-10-30 MED ORDER — BUPROPION HCL ER (XL) 150 MG PO TB24: ORAL_TABLET | ORAL | 0 refills | Status: DC

## 2021-10-30 MED ORDER — MODAFINIL 200 MG PO TABS: ORAL_TABLET | ORAL | 3 refills | Status: DC

## 2021-10-30 NOTE — Progress Notes (Signed)
Shannon [name] [id number] August 28, 1965 56 y.o.    Subjective:   Patient ID:  Shannon [name] is a 56 y.o. (DOB 06-Mar-1966) female.  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  seen November 2020 .  No meds were changed at that time.  10/16/19 the following is noted at this appt: Asked questions about sleep supplement.   Just started GI doctor.   Good overall. No mood swings. No depressive episodes.  Cry more with hormonal changes more easily.   Probably sleep too much to avoid.  Occ stress from work interferes with sleep.  Not as productive from home.  Hopes to get back to the office.  Still working from home.  Boss noticed her forgetfulness with deadlines and missing issues in emails.  Used to be sharper. Wonders if related to meds.  Can usually nap if desired for years and not seasonal.  Only anxiety is Covid related. More poor self care.   Hysterectomy December 2020. No med changes  03/18/2020 appointment with the following noted: July had Covid and both parents and her father died from [place]. Able to help mom with arrangements. Required one iron infusion this year. Never took NAC bc it was too hard to take. Sleep, eating schedule is irregular. More productive at home helps her feel better. Plan: No med changes. 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 For a month had dizzy, falls, NV all of which cleared quickly after removal.  Radiation to brain after this. More CA in lungs and abdomen and having chemo.  Oncologist said she has about 5 years of lifespan left.  MRI and CT pending. Gotten into therapy starting  [name] next week.   Pray a lot and good connection with friends.  Good support.  Trying to do things for fun.   Is working but finding it difficult mentally and physically DT SE fatigue from chemo. Considering LT disability but $ concerns.   If anxious trouble falling asleep.   Works from home.   D 54 yo.   Plan: No med changes except added modafinil  11/26/2020 appointment with the following noted: Added modafinil and didn't notice much from it. Sleep average about 7 hours.  Would prefer 10 hours and may nap here and there in week of chemo even on chemo.  Had to get it at [facility] with [facility]. No dx of OSA. Last 2 weeks a little low emotionally and mentally after up for a couple of weeks with company and it wore her out. Sleepiness is normally worse than tiredness. Only situational mood effects. Plan: No benefit 200 mg modafinil nor SE therefore Increase Modafinil to 300 mg daily for excessive sleepiness  04/21/2021 appointment with the following noted: Still dealing with chemotx for colon cancer Thinks increase modafinil questionable but misses it more than takes it. No SE. Things are getting harder rough month with more depression and anxiety related to cancer and daughter.  Work is hard.  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  Missing deadlines.   Needs to call social security. D 54 yo and in therapy with [name].  So angry with patient. Has been able to stay on branded Seroquel. Tumors on CT scan  are shrinking recently. Plan: HX RELAPSE PSYCHOSIS WITH GENERIC QUETIAPINE, Continue branded Seroquel 800 mg HS and lamotrigine 150 mg daily and Luvox 300 mg daily Vraylar 1.5 mg daily for 2 weeks, Then if tolerated increase to 3 mg daily. If mood improves then reduce Seroquel to 1 and 1/2 tablets.  06/06/2021 appt noted: I like it.  More clear headed and better energy.  Missed a few bc out of samples. Reduced Seroquel 600 mg HS Less depressed.   Especially right after starting and others noticed the benefit. Plan : Continue lamotrigine 150 mg daily and Luvox 300 mg daily Vraylar 3 mg daily. Reduce Seroquel to 400 mg nightly for 2 weeks,  Then reduce Seroquel to 200 mg nightly for 2 weeks,  Then reduce to 100 mg for 2 weeks, Then reduce to 50 mg 2 weeks then stop if you can still sleep ok  08/07/2021 appointment with the following noted: Continues Vraylar 3 mg and Luvox 300 mg daily.  Reduced Seroquel to 200 mg nightly and takes Ambien 5 mg nightly Went slowly down with Seroquel. Doing pretty good.  Some night hard to go to sleep but usally ok.   Still please with Vraylar less sedated. No mood swings.  Some struggle with depression with reduced motivation and enjoyment.  Sometimes bored and would rather go to bed.  Don't want to read or watch TV.   Can follow a show but doesn't find it intersting.  CT scan show tumors shrinking. Plan: Continue lamotrigine 150 mg daily and Luvox 300 mg daily Reduce Vraylar 3 mg every other day to see If depression energy and interest and enjoyment is better Reduce Seroquel to 150 for 2 weeks Then reduce to 100 mg for 2 weeks, Then reduce to 50 mg 2 weeks then stop if you can still sleep ok Wants to have access to Ambien Plan: No benefit 200 mg modafinil nor SE therefore OK continue trial  Modafinil to 300 mg daily for excessive sleepiness  10/30/2021 appointment with the following noted: Reduced quetiapine to 100 mg and trouble sleeping so back to 150 mg HS Fine with reduced Vraylar to 3 mg QOD Increase modafinil to 300 mg and is more alert and more active. No SE Overall mood is ok but still likes to resto often but not sleeping as much.  Not sure if it is chemo related.  Will rest after activity. At night sleeps 10 hours. Still hard to enjoy things and part of reason will lay down bc is bored.   Changed TV cable and not watching much now.  Can't concentrate to read well.  Enjoys dog.  Enjoying some  projects at home.  Patient denies difficulty with sleep maintenance.   Denies appetite disturbance.  Patient reports that motivation have been good.  Patient denies any suicidal ideation.  Still too monotone. This is last week at work.  Not doing well at work DT concentration.  Will apply for disability. Ativan only every few weeks.  Past Psychiatric Medication Trials: HX RELAPSE PSYCHOSIS WITH GENERIC QUETIAPINE,  Seroquel 800, Risperdal 2, inVega 3, Abilify 15, Vraylar 3 Citalopram, fluoxetine 80, sertraline to 50,  lamotrigine 200,  Modafinil 300 Has been under the care of the psychiatrist since March 2000  D seeing [name]  Review of Systems:  Review of Systems  Constitutional:  Positive for fatigue.  HENT:  Negative for rhinorrhea.  Eyes:  Negative for discharge.  Gastrointestinal:  Positive for constipation. Negative for abdominal distention.  Neurological:  Positive for weakness. Negative for tremors.       No history of SZ  Psychiatric/Behavioral:  Positive for dysphoric mood.    Medications: I have reviewed the patient's current medications.  Current Outpatient Medications  Medication Sig Dispense Refill   acetaminophen (TYLENOL) 325 MG tablet Take 2 tablets (650 mg total) by mouth every 6 (six) hours as needed for mild pain.     atorvastatin (LIPITOR) 10 MG tablet Take by mouth.     buPROPion (WELLBUTRIN XL) 150 MG 24 hr tablet 1 in the AM for 1 week then 2 in the AM 30 tablet 0   CONTOUR NEXT TEST test strip 3 (three) times daily.     Dulaglutide (TRULICITY) 1.5 FV/4.9SW SOPN Inject into the skin.     fluvoxaMINE (LUVOX) 100 MG tablet Take 3 tablets (300 mg total) by mouth at bedtime. 270 tablet 0   hyoscyamine (LEVSIN SL) 0.125 MG SL tablet Place 1 tablet (0.125 mg total) under the tongue every 6 (six) hours as needed. 45 tablet 1   insulin aspart (NOVOLOG FLEXPEN) 100 UNIT/ML FlexPen Inject 8 Units into the skin 3 (three) times daily with meals. 15 mL 11    insulin glargine (LANTUS) 100 UNIT/ML Solostar Pen Inject 26 Units into the skin daily. 15 mL 11   lamoTRIgine (LAMICTAL) 150 MG tablet Take 1 tablet (150 mg total) by mouth daily. 90 tablet 3   levothyroxine (SYNTHROID) 75 MCG tablet Take 1 tablet (75 mcg total) by mouth daily. 30 tablet 0   loratadine (CLARITIN) 10 MG tablet Take 10 mg by mouth daily.     LORazepam (ATIVAN) 0.5 MG tablet Take 0.5 mg by mouth every 8 (eight) hours as needed for anxiety.     metFORMIN (GLUCOPHAGE) 500 MG tablet Take 1 tablet (500 mg total) by mouth 2 (two) times daily with a meal. 60 tablet 0   Multiple Vitamin (MULTI-VITAMIN) tablet Take 1 tablet by mouth daily.     NOVOFINE PEN NEEDLE 32G X 6 MM MISC SMARTSIG:Syringe(s) SUB-Q     Omega-3 1000 MG CAPS Take by mouth.     ondansetron (ZOFRAN) 4 MG tablet Take 1 tablet (4 mg total) by mouth every 4 (four) hours as needed for nausea. 90 tablet 3   pantoprazole (PROTONIX) 40 MG tablet TAKE 1 TABLET BY MOUTH EVERYDAY AT BEDTIME 30 tablet 0   prochlorperazine (COMPAZINE) 10 MG tablet TAKE 1 TABLET BY MOUTH EVERY 6 HOURS AS NEEDED FOR NAUSEA OR VOMITING. 90 tablet 3   QUEtiapine (SEROQUEL) 100 MG tablet TAKE 2 TABLETS BY MOUTH AT BEDTIME. (Patient taking differently: 1 1/2 tab) 60 tablet 2   senna-docusate (SENOKOT-S) 8.6-50 MG tablet Take 2 tablets by mouth at bedtime.     VRAYLAR 3 MG capsule TAKE 1 CAPSULE BY MOUTH DAILY. (Patient taking differently: Every other day) 30 capsule 1   zolpidem (AMBIEN) 5 MG tablet Take 1 tablet (5 mg total) by mouth at bedtime as needed for sleep. 30 tablet 0   modafinil (PROVIGIL) 200 MG tablet TAKE 1 & 1/2 (ONE & ONE-HALF) TABLETS BY MOUTH ONCE DAILY 45 tablet 3   potassium chloride SA (KLOR-CON M) 20 MEQ tablet Take 1 tablet (20 mEq total) by mouth daily. (Patient not taking: Reported on 08/05/2021) 5 tablet 0   No current facility-administered medications for this visit.    Medication Side Effects: None  Allergies:  Allergies   Allergen Reactions   Clindamycin/Lincomycin Rash   Irinotecan Rash   Leucovorin Rash   Penicillins Rash    Reaction: 10 years    Past Medical History:  Diagnosis Date   Anemia    Iron De   Anxiety    Blood clot in vein    Bowel obstruction ([place])    Colon cancer ([place]) 2018   treated with surgery and chemotherapy   Depression    Diabetes mellitus without complication ([place])    Type II   DVT (deep venous thrombosis) ([place]) 2019   behind right knee- while she wasa on chemo   GERD (gastroesophageal reflux disease)    History of blood transfusion    History of chemotherapy    History of kidney stones 2012   passed   Hypothyroidism    Obesity     Family History  Problem Relation Age of Onset   Irritable bowel syndrome Mother    Colon polyps Father    Breast cancer Maternal Grandmother    Diabetes Maternal Grandmother    Diabetes Maternal Grandfather    Breast cancer Paternal Grandmother    Colon polyps Maternal Uncle    Stomach cancer Neg Hx    Pancreatic cancer Neg Hx    Esophageal cancer Neg Hx    Colon cancer Neg Hx     Social History   Socioeconomic History   Marital status: Widowed    Spouse name: Not on file   Number of children: Not on file   Years of education: Not on file   Highest education level: Not on file  Occupational History   Occupation: care coordinator   Tobacco Use   Smoking status: Never   Smokeless tobacco: Never  Vaping Use   Vaping Use: Never used  Substance and Sexual Activity   Alcohol use: Yes    Comment: rare   Drug use: Never   Sexual activity: Not Currently  Other Topics Concern   Not on file  Social History Narrative   Not on file   Social Determinants of Health   Financial Resource Strain: Not on file  Food Insecurity: Not on file  Transportation Needs: Not on file  Physical Activity: Not on file  Stress: Not on file  Social Connections: Not on file  Intimate Partner Violence: Not on file    Past Medical  History, Surgical history, Social history, and Family history were reviewed and updated as appropriate.   Please see review of systems for further details on the patient's review from today.   Objective:   Physical Exam:  LMP 02/22/2019 (Approximate)   Physical Exam Constitutional:      General: She is not in acute distress. Musculoskeletal:        General: No deformity.  Neurological:     Mental Status: She is alert and oriented to person, place, and time.     Coordination: Coordination normal.  Psychiatric:        Attention and Perception: Attention and perception normal. She does not perceive auditory or visual hallucinations.        Mood and Affect: Mood is anxious and depressed. Affect is not labile, blunt, angry or inappropriate.        Speech: Speech normal.        Behavior: Behavior normal.        Thought Content: Thought content normal. Thought content is not paranoid or delusional. Thought content does not include homicidal or suicidal ideation.  Cognition and Memory: Cognition and memory normal.        Judgment: Judgment normal.     Comments: Insight intact Still some anhedonia and depression    Lab Review:     Component Value Date/Time   NA 140 10/24/2021 0000   K 4.1 10/24/2021 0000   CL 105 10/24/2021 0000   CO2 24 (A) 10/24/2021 0000   GLUCOSE 234 (H) 06/07/2020 0525   BUN 17 10/24/2021 0000   CREATININE 1.3 (A) 10/24/2021 0000   CREATININE 0.97 06/07/2020 0525   CALCIUM 9.1 10/24/2021 0000   PROT 5.2 (L) 06/07/2020 0525   ALBUMIN 4.0 10/24/2021 0000   AST 24 10/24/2021 0000   ALT 24 10/24/2021 0000   ALKPHOS 127 (A) 10/24/2021 0000   BILITOT 0.3 06/07/2020 0525   GFRNONAA >60 06/07/2020 0525       Component Value Date/Time   WBC 3.4 10/24/2021 0000   WBC 4.8 06/07/2020 0525   RBC 3.89 10/24/2021 0000   HGB 12.2 10/24/2021 0000   HCT 38 10/24/2021 0000   PLT 144 (A) 10/24/2021 0000   MCV 98 09/19/2021 0000   MCH 32.6 09/16/2020 0000    MCHC 33 09/16/2020 0000   RDW 13.5 06/07/2020 0525   LYMPHSABS 1.0 06/07/2020 0525   MONOABS 0.4 06/07/2020 0525   EOSABS 0.0 06/07/2020 0525   BASOSABS 0.0 06/07/2020 0525    No results found for: POCLITH, LITHIUM   No results found for: PHENYTOIN, PHENOBARB, VALPROATE, CBMZ   .res Assessment: Plan:    Severe recurrent major depression with psychotic features ([place]) - Plan: buPROPion (WELLBUTRIN XL) 150 MG 24 hr tablet, modafinil (PROVIGIL) 200 MG tablet  Delayed sleep phase syndrome  Mixed obsessional thoughts and acts  Mild cognitive impairment - Plan: modafinil (PROVIGIL) 200 MG tablet  Neoplastic malignant related fatigue - Plan: modafinil (PROVIGIL) 200 MG tablet   Overall mood and anxiety are less well managed.  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 In general has handled this well except some trouble going to sleep on occ with anxiety.  However more recently she is still too down and flat.  Try to avoid Xanax if possible BC DDI with Luvox. Ambien 5 prn.  Disc amnesia risk.  No benefit 200 mg modafinil nor SE therefore OK continue Modafinil to 300 mg daily for excessive sleepiness  Discussed potential metabolic side effects associated with atypical antipsychotics, as well as potential risk for movement side effects. Advised pt to contact office if movement side effects occur.   Counseled patient regarding potential benefits, risks, and side effects of Lamictal to include potential risk of Stevens-Johnson syndrome. Advised patient to stop taking Lamictal and contact office immediately if rash develops and to seek urgent medical attention if rash is severe and/or spreading quickly.  HX RELAPSE PSYCHOSIS WITH GENERIC QUETIAPINE, Continue lamotrigine 150 mg daily  Continue Vraylar 3 mg every other day  Try again to Reduce Seroquel to 100 mg for 2 weeks, Then reduce to 50 mg 2 weeks then stop if you can still  sleep ok Wants to have access to Ambien Reduce Luvox to 2 and 1/2 tablets Start Wellbutrin in the AM 1 for 1 week then 2 or 300 mg AM  Disc insomnia is frequent problem trying to wean off Seroquel.  Disc polypharmacy and will try to eliminate some meds if depression improves.  FU 8 weeks  Lynder Parents, MD, DFAPA   Please  see After Visit Summary for patient specific instructions.  Future Appointments  Date Time Provider [place]  10/31/2021  1:30 PM [facility] INFUSION CHAIR 1 [facility] None  11/05/2021 10:30 AM [facility] BED 1 [facility] None  11/06/2021 10:30 AM [facility] INFUSION CHAIR 4 [facility] None  11/06/2021  2:10 PM GI-315 MR 3 GI[facility]315MRI [facility] [address]  11/07/2021 11:00 AM [facility] INFUSION CHAIR 5 [facility] None  11/10/2021 12:00 PM Vaslow, Acey Lav, MD [facility] None  11/14/2021 10:15 AM [facility]-LAB [facility] None  11/14/2021 10:45 AM Lewis, Dequincy A, MD [facility] None  11/18/2021 10:00 AM [facility] INFUSION CHAIR 6 [facility] None  11/20/2021  1:30 PM [facility] INFUSION CHAIR 5 [facility] None  11/25/2021 10:30 AM [facility] INFUSION CHAIR 2 [facility] None  11/26/2021 11:00 AM [facility] INFUSION CHAIR 8 [facility] None  11/27/2021 10:30 AM [facility] INFUSION CHAIR 1 [facility] None    No orders of the defined types were placed in this encounter.      -------------------------------

## 2021-10-30 NOTE — Telephone Encounter (Signed)
Patient reports that redness has completely resolved

## 2021-10-30 NOTE — Telephone Encounter (Signed)
Prior Authorization initiated Modafinil 200 mg - 1.5 tabs qd. [facility]  [id number]  Need to do PA thru rxb.promptpa.com

## 2021-10-30 NOTE — Patient Instructions (Addendum)
Continue lamotrigine 150 mg daily  Continue Vraylar 3 mg every other day  Try again to Reduce Seroquel to 100 mg for 2 weeks, Then reduce to 50 mg 2 weeks then stop if you can still sleep ok Reduce Luvox to 2 and 1/2 tablets Start Wellbutrin in the AM 1 for 1 week then 2 or 300 mg AM

## 2021-10-31 ENCOUNTER — Inpatient Hospital Stay: Payer: PRIVATE HEALTH INSURANCE | Attending: Oncology

## 2021-10-31 VITALS — BP 118/68 | HR 97 | Temp 97.8°F | Resp 20

## 2021-10-31 DIAGNOSIS — Z5111 Encounter for antineoplastic chemotherapy: Secondary | ICD-10-CM | POA: Insufficient documentation

## 2021-10-31 DIAGNOSIS — C7931 Secondary malignant neoplasm of brain: Secondary | ICD-10-CM | POA: Diagnosis not present

## 2021-10-31 DIAGNOSIS — R42 Dizziness and giddiness: Secondary | ICD-10-CM | POA: Insufficient documentation

## 2021-10-31 DIAGNOSIS — R5383 Other fatigue: Secondary | ICD-10-CM | POA: Diagnosis not present

## 2021-10-31 DIAGNOSIS — E119 Type 2 diabetes mellitus without complications: Secondary | ICD-10-CM | POA: Insufficient documentation

## 2021-10-31 DIAGNOSIS — Z5189 Encounter for other specified aftercare: Secondary | ICD-10-CM | POA: Insufficient documentation

## 2021-10-31 DIAGNOSIS — C182 Malignant neoplasm of ascending colon: Secondary | ICD-10-CM

## 2021-10-31 DIAGNOSIS — C189 Malignant neoplasm of colon, unspecified: Secondary | ICD-10-CM | POA: Insufficient documentation

## 2021-10-31 DIAGNOSIS — Z803 Family history of malignant neoplasm of breast: Secondary | ICD-10-CM | POA: Insufficient documentation

## 2021-10-31 DIAGNOSIS — Z90710 Acquired absence of both cervix and uterus: Secondary | ICD-10-CM | POA: Diagnosis not present

## 2021-10-31 MED ORDER — HEPARIN SOD (PORK) LOCK FLUSH 100 UNIT/ML IV SOLN
500.0000 [IU] | Freq: Once | INTRAVENOUS | Status: AC | PRN
  Administered 2021-10-31: 500 [IU]

## 2021-10-31 MED ORDER — SODIUM CHLORIDE 0.9% FLUSH
10.0000 mL | INTRAVENOUS | Status: DC | PRN
  Administered 2021-10-31: 10 mL

## 2021-10-31 NOTE — Patient Instructions (Signed)
Fluorouracil, 5-FU injection What is this medication? FLUOROURACIL, 5-FU (flure oh YOOR a sil) is a chemotherapy drug. It slows the growth of cancer cells. This medicine is used to treat many types of cancer like breast cancer, colon or rectal cancer, pancreatic cancer, and stomach cancer. This medicine may be used for other purposes; ask your health care provider or pharmacist if you have questions. COMMON BRAND NAME(S): Adrucil What should I tell my care team before I take this medication? They need to know if you have any of these conditions: blood disorders dihydropyrimidine dehydrogenase (DPD) deficiency infection (especially a virus infection such as chickenpox, cold sores, or herpes) kidney disease liver disease malnourished, poor nutrition recent or ongoing radiation therapy an unusual or allergic reaction to fluorouracil, other chemotherapy, other medicines, foods, dyes, or preservatives pregnant or trying to get pregnant breast-feeding How should I use this medication? This drug is given as an infusion or injection into a vein. It is administered in a hospital or clinic by a specially trained health care professional. Talk to your pediatrician regarding the use of this medicine in children. Special care may be needed. Overdosage: If you think you have taken too much of this medicine contact a poison control center or emergency room at once. NOTE: This medicine is only for you. Do not share this medicine with others. What if I miss a dose? It is important not to miss your dose. Call your doctor or health care professional if you are unable to keep an appointment. What may interact with this medication? Do not take this medicine with any of the following medications: live virus vaccines This medicine may also interact with the following medications: medicines that treat or prevent blood clots like warfarin, enoxaparin, and dalteparin This list may not describe all possible  interactions. Give your health care provider a list of all the medicines, herbs, non-prescription drugs, or dietary supplements you use. Also tell them if you smoke, drink alcohol, or use illegal drugs. Some items may interact with your medicine. What should I watch for while using this medication? Visit your doctor for checks on your progress. This drug may make you feel generally unwell. This is not uncommon, as chemotherapy can affect healthy cells as well as cancer cells. Report any side effects. Continue your course of treatment even though you feel ill unless your doctor tells you to stop. In some cases, you may be given additional medicines to help with side effects. Follow all directions for their use. Call your doctor or health care professional for advice if you get a fever, chills or sore throat, or other symptoms of a cold or flu. Do not treat yourself. This drug decreases your body's ability to fight infections. Try to avoid being around people who are sick. This medicine may increase your risk to bruise or bleed. Call your doctor or health care professional if you notice any unusual bleeding. Be careful brushing and flossing your teeth or using a toothpick because you may get an infection or bleed more easily. If you have any dental work done, tell your dentist you are receiving this medicine. Avoid taking products that contain aspirin, acetaminophen, ibuprofen, naproxen, or ketoprofen unless instructed by your doctor. These medicines may hide a fever. Do not become pregnant while taking this medicine. Women should inform their doctor if they wish to become pregnant or think they might be pregnant. There is a potential for serious side effects to an unborn child. Talk to your health care   professional or pharmacist for more information. Do not breast-feed an infant while taking this medicine. Men should inform their doctor if they wish to father a child. This medicine may lower sperm  counts. Do not treat diarrhea with over the counter products. Contact your doctor if you have diarrhea that lasts more than 2 days or if it is severe and watery. This medicine can make you more sensitive to the sun. Keep out of the sun. If you cannot avoid being in the sun, wear protective clothing and use sunscreen. Do not use sun lamps or tanning beds/booths. What side effects may I notice from receiving this medication? Side effects that you should report to your doctor or health care professional as soon as possible: allergic reactions like skin rash, itching or hives, swelling of the face, lips, or tongue low blood counts - this medicine may decrease the number of white blood cells, red blood cells and platelets. You may be at increased risk for infections and bleeding. signs of infection - fever or chills, cough, sore throat, pain or difficulty passing urine signs of decreased platelets or bleeding - bruising, pinpoint red spots on the skin, black, tarry stools, blood in the urine signs of decreased red blood cells - unusually weak or tired, fainting spells, lightheadedness breathing problems changes in vision chest pain mouth sores nausea and vomiting pain, swelling, redness at site where injected pain, tingling, numbness in the hands or feet redness, swelling, or sores on hands or feet stomach pain unusual bleeding Side effects that usually do not require medical attention (report to your doctor or health care professional if they continue or are bothersome): changes in finger or toe nails diarrhea dry or itchy skin hair loss headache loss of appetite sensitivity of eyes to the light stomach upset unusually teary eyes This list may not describe all possible side effects. Call your doctor for medical advice about side effects. You may report side effects to FDA at 1-800-FDA-1088. Where should I keep my medication? This drug is given in a hospital or clinic and will not be  stored at home. NOTE: This sheet is a summary. It may not cover all possible information. If you have questions about this medicine, talk to your doctor, pharmacist, or health care provider.  2023 Elsevier/Gold Standard (2021-04-18 00:00:00)  

## 2021-11-02 ENCOUNTER — Encounter: Payer: Self-pay | Admitting: Oncology

## 2021-11-04 ENCOUNTER — Ambulatory Visit: Payer: PRIVATE HEALTH INSURANCE | Admitting: Internal Medicine

## 2021-11-05 ENCOUNTER — Inpatient Hospital Stay: Payer: PRIVATE HEALTH INSURANCE

## 2021-11-05 VITALS — BP 112/80 | HR 98 | Temp 98.1°F | Resp 18 | Ht 68.0 in | Wt 180.0 lb

## 2021-11-05 DIAGNOSIS — C182 Malignant neoplasm of ascending colon: Secondary | ICD-10-CM

## 2021-11-05 DIAGNOSIS — Z5111 Encounter for antineoplastic chemotherapy: Secondary | ICD-10-CM | POA: Diagnosis not present

## 2021-11-05 MED ORDER — FILGRASTIM-SNDZ 480 MCG/0.8ML IJ SOSY
480.0000 ug | PREFILLED_SYRINGE | Freq: Once | INTRAMUSCULAR | Status: AC
  Administered 2021-11-05: 480 ug via SUBCUTANEOUS
  Filled 2021-11-05: qty 0.8

## 2021-11-05 NOTE — Patient Instructions (Signed)
Filgrastim, G-CSF injection ?What is this medication? ?FILGRASTIM, G-CSF (fil GRA stim) is a granulocyte colony-stimulating factor that stimulates the growth of neutrophils, a type of white blood cell (WBC) important in the body's fight against infection. It is used to reduce the incidence of fever and infection in patients with certain types of cancer who are receiving chemotherapy that affects the bone marrow, to stimulate blood cell production for removal of WBCs from the body prior to a bone marrow transplantation, to reduce the incidence of fever and infection in patients who have severe chronic neutropenia, and to improve survival outcomes following high-dose radiation exposure that is toxic to the bone marrow. ?This medicine may be used for other purposes; ask your health care provider or pharmacist if you have questions. ?COMMON BRAND NAME(S): Neupogen, Nivestym, Releuko, Zarxio ?What should I tell my care team before I take this medication? ?They need to know if you have any of these conditions: ?kidney disease ?latex allergy ?ongoing radiation therapy ?sickle cell disease ?an unusual or allergic reaction to filgrastim, pegfilgrastim, other medicines, foods, dyes, or preservatives ?pregnant or trying to get pregnant ?breast-feeding ?How should I use this medication? ?This medicine is for injection under the skin or infusion into a vein. As an infusion into a vein, it is usually given by a health care professional in a hospital or clinic setting. If you get this medicine at home, you will be taught how to prepare and give this medicine. Refer to the Instructions for Use that come with your medication packaging. Use exactly as directed. Take your medicine at regular intervals. Do not take your medicine more often than directed. ?It is important that you put your used needles and syringes in a special sharps container. Do not put them in a trash can. If you do not have a sharps container, call your pharmacist  or healthcare provider to get one. ?Talk to your pediatrician regarding the use of this medicine in children. While this drug may be prescribed for children as young as 7 months for selected conditions, precautions do apply. ?Overdosage: If you think you have taken too much of this medicine contact a poison control center or emergency room at once. ?NOTE: This medicine is only for you. Do not share this medicine with others. ?What if I miss a dose? ?It is important not to miss your dose. Call your doctor or health care professional if you miss a dose. ?What may interact with this medication? ?This medicine may interact with the following medications: ?medicines that may cause a release of neutrophils, such as lithium ?This list may not describe all possible interactions. Give your health care provider a list of all the medicines, herbs, non-prescription drugs, or dietary supplements you use. Also tell them if you smoke, drink alcohol, or use illegal drugs. Some items may interact with your medicine. ?What should I watch for while using this medication? ?Your condition will be monitored carefully while you are receiving this medicine. ?You may need blood work done while you are taking this medicine. ?Talk to your health care provider about your risk of cancer. You may be more at risk for certain types of cancer if you take this medicine. ?What side effects may I notice from receiving this medication? ?Side effects that you should report to your doctor or health care professional as soon as possible: ?allergic reactions like skin rash, itching or hives, swelling of the face, lips, or tongue ?back pain ?dizziness or feeling faint ?fever ?pain, redness, or   irritation at site where injected ?pinpoint red spots on the skin ?shortness of breath or breathing problems ?signs and symptoms of kidney injury like trouble passing urine, change in the amount of urine, or red or dark-brown urine ?stomach or side pain, or pain at  the shoulder ?swelling ?tiredness ?unusual bleeding or bruising ?Side effects that usually do not require medical attention (report to your doctor or health care professional if they continue or are bothersome): ?bone pain ?cough ?diarrhea ?hair loss ?headache ?muscle pain ?This list may not describe all possible side effects. Call your doctor for medical advice about side effects. You may report side effects to FDA at 1-800-FDA-1088. ?Where should I keep my medication? ?Keep out of the reach of children. ?Store in a refrigerator between 2 and 8 degrees C (36 and 46 degrees F). Do not freeze. Keep in carton to protect from light. Throw away this medicine if vials or syringes are left out of the refrigerator for more than 24 hours. Throw away any unused medicine after the expiration date. ?NOTE: This sheet is a summary. It may not cover all possible information. If you have questions about this medicine, talk to your doctor, pharmacist, or health care provider. ?? 2023 Elsevier/Gold Standard (2021-04-18 00:00:00) ? ?

## 2021-11-06 ENCOUNTER — Other Ambulatory Visit: Payer: Self-pay | Admitting: Pharmacist

## 2021-11-06 ENCOUNTER — Inpatient Hospital Stay: Payer: PRIVATE HEALTH INSURANCE

## 2021-11-06 ENCOUNTER — Ambulatory Visit
Admission: RE | Admit: 2021-11-06 | Discharge: 2021-11-06 | Disposition: A | Discharge status: Home / Self Care | Payer: PRIVATE HEALTH INSURANCE | Source: Ambulatory Visit | Attending: Internal Medicine | Admitting: Internal Medicine

## 2021-11-06 VITALS — BP 120/86 | HR 99 | Temp 98.2°F | Resp 18 | Ht 68.0 in | Wt 180.5 lb

## 2021-11-06 DIAGNOSIS — Z5111 Encounter for antineoplastic chemotherapy: Secondary | ICD-10-CM | POA: Diagnosis not present

## 2021-11-06 DIAGNOSIS — C182 Malignant neoplasm of ascending colon: Secondary | ICD-10-CM

## 2021-11-06 DIAGNOSIS — C7931 Secondary malignant neoplasm of brain: Secondary | ICD-10-CM

## 2021-11-06 MED ORDER — HEPARIN SOD (PORK) LOCK FLUSH 100 UNIT/ML IV SOLN
500.0000 [IU] | Freq: Once | INTRAVENOUS | Status: AC
  Administered 2021-11-06: 500 [IU] via INTRAVENOUS

## 2021-11-06 MED ORDER — FILGRASTIM-SNDZ 480 MCG/0.8ML IJ SOSY
480.0000 ug | PREFILLED_SYRINGE | Freq: Once | INTRAMUSCULAR | Status: AC
  Administered 2021-11-06: 480 ug via SUBCUTANEOUS
  Filled 2021-11-06: qty 0.8

## 2021-11-06 MED ORDER — SODIUM CHLORIDE 0.9 % IV SOLN: Freq: Once | INTRAVENOUS | Status: AC

## 2021-11-06 MED ORDER — HEPARIN SOD (PORK) LOCK FLUSH 100 UNIT/ML IV SOLN
500.0000 [IU] | Freq: Once | INTRAVENOUS | Status: AC | PRN
  Administered 2021-11-06: 500 [IU]

## 2021-11-06 MED ORDER — SODIUM CHLORIDE 0.9% FLUSH
10.0000 mL | INTRAVENOUS | Status: DC | PRN
  Administered 2021-11-06: 10 mL

## 2021-11-06 MED ORDER — GADOBENATE DIMEGLUMINE 529 MG/ML IV SOLN
16.0000 mL | Freq: Once | INTRAVENOUS | Status: AC | PRN
  Administered 2021-11-06: 16 mL via INTRAVENOUS

## 2021-11-06 NOTE — Progress Notes (Signed)
1200-pt having mri this afternoon.  Requested that port be left accessed.  Microdisc placed and sight dressed with large transparent dressing. Pt tolerated well.

## 2021-11-06 NOTE — Patient Instructions (Signed)
Filgrastim, G-CSF injection ?What is this medication? ?FILGRASTIM, G-CSF (fil GRA stim) is a granulocyte colony-stimulating factor that stimulates the growth of neutrophils, a type of white blood cell (WBC) important in the body's fight against infection. It is used to reduce the incidence of fever and infection in patients with certain types of cancer who are receiving chemotherapy that affects the bone marrow, to stimulate blood cell production for removal of WBCs from the body prior to a bone marrow transplantation, to reduce the incidence of fever and infection in patients who have severe chronic neutropenia, and to improve survival outcomes following high-dose radiation exposure that is toxic to the bone marrow. ?This medicine may be used for other purposes; ask your health care provider or pharmacist if you have questions. ?COMMON BRAND NAME(S): Neupogen, Nivestym, Releuko, Zarxio ?What should I tell my care team before I take this medication? ?They need to know if you have any of these conditions: ?kidney disease ?latex allergy ?ongoing radiation therapy ?sickle cell disease ?an unusual or allergic reaction to filgrastim, pegfilgrastim, other medicines, foods, dyes, or preservatives ?pregnant or trying to get pregnant ?breast-feeding ?How should I use this medication? ?This medicine is for injection under the skin or infusion into a vein. As an infusion into a vein, it is usually given by a health care professional in a hospital or clinic setting. If you get this medicine at home, you will be taught how to prepare and give this medicine. Refer to the Instructions for Use that come with your medication packaging. Use exactly as directed. Take your medicine at regular intervals. Do not take your medicine more often than directed. ?It is important that you put your used needles and syringes in a special sharps container. Do not put them in a trash can. If you do not have a sharps container, call your pharmacist  or healthcare provider to get one. ?Talk to your pediatrician regarding the use of this medicine in children. While this drug may be prescribed for children as young as 7 months for selected conditions, precautions do apply. ?Overdosage: If you think you have taken too much of this medicine contact a poison control center or emergency room at once. ?NOTE: This medicine is only for you. Do not share this medicine with others. ?What if I miss a dose? ?It is important not to miss your dose. Call your doctor or health care professional if you miss a dose. ?What may interact with this medication? ?This medicine may interact with the following medications: ?medicines that may cause a release of neutrophils, such as lithium ?This list may not describe all possible interactions. Give your health care provider a list of all the medicines, herbs, non-prescription drugs, or dietary supplements you use. Also tell them if you smoke, drink alcohol, or use illegal drugs. Some items may interact with your medicine. ?What should I watch for while using this medication? ?Your condition will be monitored carefully while you are receiving this medicine. ?You may need blood work done while you are taking this medicine. ?Talk to your health care provider about your risk of cancer. You may be more at risk for certain types of cancer if you take this medicine. ?What side effects may I notice from receiving this medication? ?Side effects that you should report to your doctor or health care professional as soon as possible: ?allergic reactions like skin rash, itching or hives, swelling of the face, lips, or tongue ?back pain ?dizziness or feeling faint ?fever ?pain, redness, or   irritation at site where injected ?pinpoint red spots on the skin ?shortness of breath or breathing problems ?signs and symptoms of kidney injury like trouble passing urine, change in the amount of urine, or red or dark-brown urine ?stomach or side pain, or pain at  the shoulder ?swelling ?tiredness ?unusual bleeding or bruising ?Side effects that usually do not require medical attention (report to your doctor or health care professional if they continue or are bothersome): ?bone pain ?cough ?diarrhea ?hair loss ?headache ?muscle pain ?This list may not describe all possible side effects. Call your doctor for medical advice about side effects. You may report side effects to FDA at 1-800-FDA-1088. ?Where should I keep my medication? ?Keep out of the reach of children. ?Store in a refrigerator between 2 and 8 degrees C (36 and 46 degrees F). Do not freeze. Keep in carton to protect from light. Throw away this medicine if vials or syringes are left out of the refrigerator for more than 24 hours. Throw away any unused medicine after the expiration date. ?NOTE: This sheet is a summary. It may not cover all possible information. If you have questions about this medicine, talk to your doctor, pharmacist, or health care provider. ?? 2023 Elsevier/Gold Standard (2021-04-18 00:00:00) ? ?

## 2021-11-07 ENCOUNTER — Inpatient Hospital Stay: Payer: PRIVATE HEALTH INSURANCE

## 2021-11-07 ENCOUNTER — Other Ambulatory Visit: Payer: Self-pay | Admitting: Radiation Therapy

## 2021-11-07 VITALS — BP 121/81 | HR 97 | Temp 98.1°F | Resp 18 | Ht 68.0 in | Wt 179.0 lb

## 2021-11-07 DIAGNOSIS — C182 Malignant neoplasm of ascending colon: Secondary | ICD-10-CM

## 2021-11-07 DIAGNOSIS — Z5111 Encounter for antineoplastic chemotherapy: Secondary | ICD-10-CM | POA: Diagnosis not present

## 2021-11-07 MED ORDER — FILGRASTIM-SNDZ 480 MCG/0.8ML IJ SOSY
480.0000 ug | PREFILLED_SYRINGE | Freq: Once | INTRAMUSCULAR | Status: AC
  Administered 2021-11-07: 480 ug via SUBCUTANEOUS
  Filled 2021-11-07: qty 0.8

## 2021-11-07 NOTE — Patient Instructions (Signed)
Filgrastim, G-CSF injection ?What is this medication? ?FILGRASTIM, G-CSF (fil GRA stim) is a granulocyte colony-stimulating factor that stimulates the growth of neutrophils, a type of white blood cell (WBC) important in the body's fight against infection. It is used to reduce the incidence of fever and infection in patients with certain types of cancer who are receiving chemotherapy that affects the bone marrow, to stimulate blood cell production for removal of WBCs from the body prior to a bone marrow transplantation, to reduce the incidence of fever and infection in patients who have severe chronic neutropenia, and to improve survival outcomes following high-dose radiation exposure that is toxic to the bone marrow. ?This medicine may be used for other purposes; ask your health care provider or pharmacist if you have questions. ?COMMON BRAND NAME(S): Neupogen, Nivestym, Releuko, Zarxio ?What should I tell my care team before I take this medication? ?They need to know if you have any of these conditions: ?kidney disease ?latex allergy ?ongoing radiation therapy ?sickle cell disease ?an unusual or allergic reaction to filgrastim, pegfilgrastim, other medicines, foods, dyes, or preservatives ?pregnant or trying to get pregnant ?breast-feeding ?How should I use this medication? ?This medicine is for injection under the skin or infusion into a vein. As an infusion into a vein, it is usually given by a health care professional in a hospital or clinic setting. If you get this medicine at home, you will be taught how to prepare and give this medicine. Refer to the Instructions for Use that come with your medication packaging. Use exactly as directed. Take your medicine at regular intervals. Do not take your medicine more often than directed. ?It is important that you put your used needles and syringes in a special sharps container. Do not put them in a trash can. If you do not have a sharps container, call your pharmacist  or healthcare provider to get one. ?Talk to your pediatrician regarding the use of this medicine in children. While this drug may be prescribed for children as young as 7 months for selected conditions, precautions do apply. ?Overdosage: If you think you have taken too much of this medicine contact a poison control center or emergency room at once. ?NOTE: This medicine is only for you. Do not share this medicine with others. ?What if I miss a dose? ?It is important not to miss your dose. Call your doctor or health care professional if you miss a dose. ?What may interact with this medication? ?This medicine may interact with the following medications: ?medicines that may cause a release of neutrophils, such as lithium ?This list may not describe all possible interactions. Give your health care provider a list of all the medicines, herbs, non-prescription drugs, or dietary supplements you use. Also tell them if you smoke, drink alcohol, or use illegal drugs. Some items may interact with your medicine. ?What should I watch for while using this medication? ?Your condition will be monitored carefully while you are receiving this medicine. ?You may need blood work done while you are taking this medicine. ?Talk to your health care provider about your risk of cancer. You may be more at risk for certain types of cancer if you take this medicine. ?What side effects may I notice from receiving this medication? ?Side effects that you should report to your doctor or health care professional as soon as possible: ?allergic reactions like skin rash, itching or hives, swelling of the face, lips, or tongue ?back pain ?dizziness or feeling faint ?fever ?pain, redness, or   irritation at site where injected ?pinpoint red spots on the skin ?shortness of breath or breathing problems ?signs and symptoms of kidney injury like trouble passing urine, change in the amount of urine, or red or dark-brown urine ?stomach or side pain, or pain at  the shoulder ?swelling ?tiredness ?unusual bleeding or bruising ?Side effects that usually do not require medical attention (report to your doctor or health care professional if they continue or are bothersome): ?bone pain ?cough ?diarrhea ?hair loss ?headache ?muscle pain ?This list may not describe all possible side effects. Call your doctor for medical advice about side effects. You may report side effects to FDA at 1-800-FDA-1088. ?Where should I keep my medication? ?Keep out of the reach of children. ?Store in a refrigerator between 2 and 8 degrees C (36 and 46 degrees F). Do not freeze. Keep in carton to protect from light. Throw away this medicine if vials or syringes are left out of the refrigerator for more than 24 hours. Throw away any unused medicine after the expiration date. ?NOTE: This sheet is a summary. It may not cover all possible information. If you have questions about this medicine, talk to your doctor, pharmacist, or health care provider. ?? 2023 Elsevier/Gold Standard (2021-04-18 00:00:00) ? ?

## 2021-11-10 ENCOUNTER — Inpatient Hospital Stay (HOSPITAL_BASED_OUTPATIENT_CLINIC_OR_DEPARTMENT_OTHER): Payer: PRIVATE HEALTH INSURANCE | Admitting: Internal Medicine

## 2021-11-10 ENCOUNTER — Other Ambulatory Visit: Payer: Self-pay

## 2021-11-10 ENCOUNTER — Other Ambulatory Visit: Payer: Self-pay | Admitting: Oncology

## 2021-11-10 ENCOUNTER — Inpatient Hospital Stay: Payer: PRIVATE HEALTH INSURANCE

## 2021-11-10 ENCOUNTER — Other Ambulatory Visit: Payer: Self-pay | Admitting: Hematology and Oncology

## 2021-11-10 VITALS — BP 128/84 | HR 94 | Temp 97.1°F | Resp 17 | Wt 180.6 lb

## 2021-11-10 DIAGNOSIS — Z5111 Encounter for antineoplastic chemotherapy: Secondary | ICD-10-CM | POA: Diagnosis not present

## 2021-11-10 DIAGNOSIS — C7931 Secondary malignant neoplasm of brain: Secondary | ICD-10-CM

## 2021-11-10 NOTE — Progress Notes (Signed)
[facility]  [address] [place],  [place]  [address] [phone]  Clinic Day:  11/11/2021  Referring physician: Greig Right, MD  HISTORY OF PRESENT ILLNESS:  The patient is a 56 y.o. female with metastatic colon cancer, which includes spread of disease to her abdominal cavity, lungs, brain and right adrenal gland. She comes in today prior to her 30th cycle of palliative FOLFIRI/Avastin chemotherapy.  The patient claims to have tolerated her 29th cycle of treatment fairly well.  However, she did have increased facial redness after her last cycle of treatment to where histamine blocking therapy was necessary.  This alleviated her facial flushing in less than 30 minutes.  Over these past few weeks, she has had intermittent diarrhea.  She readily admits she does not use her antidiarrheal medicine as frequently as she likely should.  Of note, she was just evaluated by neurooncology yesterday.  A brain MRI done did not show any evidence of CNS progression from her underlying metastatic colon cancer.  She continues to deny having any new symptoms or findings which concern her for overt signs of disease progression.   With respect to her colon cancer history, she is status post a right hemicolectomy in early October 2018, followed by 12 cycles of adjuvant FOLFOX chemotherapy, which were completed in April 2019.  In December 2021, she underwent a left cerebellar metastatectomy, whose pathology was consistent with metastatic colon cancer.  Tumor testing did come back MMR normal.  CT scans recently showed evidence of her cancer being in multiple locations, for which she has been on FOLFIRI/Avastin ever since.  PHYSICAL EXAM:  Blood pressure 132/84, pulse 92, temperature 98.1 F (36.7 C), resp. rate 16, height 5' 8"  (1.727 m), weight 179 lb 11.2 oz (81.5 kg), last menstrual period 02/22/2019, SpO2 94 %. Wt Readings from Last 3 Encounters:  11/11/21 179 lb 11.2 oz (81.5  kg)  11/10/21 180 lb 9.6 oz (81.9 kg)  11/07/21 179 lb (81.2 kg)   Body mass index is 27.32 kg/m. Performance status (ECOG): 1 Physical Exam Constitutional:      General: She is not in acute distress.    Appearance: Normal appearance. She is normal weight. She is not ill-appearing.  HENT:     Head: Normocephalic and atraumatic.  Eyes:     General: No scleral icterus.    Extraocular Movements: Extraocular movements intact.     Conjunctiva/sclera: Conjunctivae normal.     Pupils: Pupils are equal, round, and reactive to light.  Cardiovascular:     Rate and Rhythm: Normal rate and regular rhythm.     Pulses: Normal pulses.     Heart sounds: Normal heart sounds. No murmur heard.    No friction rub. No gallop.  Pulmonary:     Effort: Pulmonary effort is normal. No respiratory distress.     Breath sounds: Normal breath sounds.  Abdominal:     General: Bowel sounds are normal. There is no distension.     Palpations: Abdomen is soft. There is no hepatomegaly, splenomegaly or mass.     Tenderness: There is no abdominal tenderness.  Musculoskeletal:        General: Normal range of motion.     Cervical back: Normal range of motion and neck supple.     Right lower leg: No edema.     Left lower leg: No edema.  Lymphadenopathy:     Cervical: No cervical adenopathy.  Skin:    General: Skin is warm  and dry.  Neurological:     General: No focal deficit present.     Mental Status: She is alert and oriented to person, place, and time. Mental status is at baseline.  Psychiatric:        Mood and Affect: Mood normal.        Behavior: Behavior normal.        Thought Content: Thought content normal.        Judgment: Judgment normal.    LABS:    Latest Reference Range & Units 11/11/21 00:00  Sodium 137 - 147  142 (E)  Potassium 3.5 - 5.1 mEq/L 3.2 ! (E)  Chloride 99 - 108  109 ! (E)  CO2 13 - 22  21 (E)  Glucose  129 (E)  BUN 4 - 21  8 (E)  Creatinine 0.5 - 1.1  1.1 (E)  Calcium 8.7  - 10.7  8.6 ! (E)  Alkaline Phosphatase 25 - 125  125 (E)  Albumin 3.5 - 5.0  4.0 (E)  AST 13 - 35  21 (E)  ALT 7 - 35 U/L 18 (E)  Bilirubin, Total  0.4 (E)  WBC  2.1 (E)  RBC 3.87 - 5.11  3.92 (E)  Hemoglobin 12.0 - 16.0  11.9 ! (E)  HCT 36 - 46  38 (E)  Platelets 150 - 400 K/uL 97 ! (E)  NEUT#  0.69 (E)  !: Data is abnormal (E): External lab result  ASSESSMENT & PLAN:  Assessment/Plan:  A 56 y.o. female with metastatic colon cancer.  She will proceed with her 30th cycle of FOLFIRI/Avastin next week, which is being given every 3 weeks.  Clinically, she is doing well.  I will see her back in approximately 3 weeks before she heads into her 31st cycle of FOLFIRI/Avastin.  CT scans will be done before her next visit to ascertain her new disease baseline after 30 cycles of palliative chemotherapy.  The patient understands all the plans discussed today and is in agreement with them.    Yehudis Monceaux Macarthur Critchley, MD

## 2021-11-10 NOTE — Progress Notes (Signed)
[facility] [place], [place] [address] [phone]   Interval Evaluation  Date of Service: 11/10/21 Patient Name: Shannon [name] Patient MRN: [id number] Patient DOB: Oct 05, 1965 Provider: Ventura Sellers, MD  Identifying Statement:  Shannon [name] is a 56 y.o. female with Malignant neoplasm metastatic to brain [facility]) [C79.31]   Primary Cancer: Colon Adenocarcinoma, Stage IV  Oncologic History: 05/29/20: Sub-occipital craniotomy, resection by Dr. Saintclair Halsted.  Path c/w colon adenocarcinoma 07/08/20: Completes fractionated SRS to resection cavity w/ Dr. Isidore Moos 10/22/20: Salvage SRS R cerebellum  Interval History:  Shannon [name] presents today for follow up after recent MRI brain.  No new or progressive deficits.  Fatigue is still present. FOLFIRI now doing every 3 weeks.  Balance is still "not great" but still walking on her own, maintains full independence.  No other changes to medications.  H+P (07/02/20) Patient presented to medical attention at the end of December, 2021 with several weeks of progressive balance impairment.  She worsened toward the end to the point of needing full assist with walking, or being mostly bed-bound.  At the same time she developed bouts of vertigo, described as "room spinning around me", for which medical interventions were not effective.  CNS imaging demonstrated large enhancing mass in left cerebellar hemisphere with swelling and compression c/w likely brain metastasis; she underwent craniotomy and resection on 05/29/20 with Dr. Saintclair Halsted.  Following surgery, her symptoms improved considerably.  At present, she is functionally intact aside from issues with short term memory, fatigue, and mood.  Visited with radiation oncology today for post-operative simulation.   Medications: Current Outpatient Medications on File Prior to Visit  Medication Sig Dispense Refill   acetaminophen  (TYLENOL) 325 MG tablet Take 2 tablets (650 mg total) by mouth every 6 (six) hours as needed for mild pain.     atorvastatin (LIPITOR) 10 MG tablet Take by mouth.     buPROPion (WELLBUTRIN XL) 150 MG 24 hr tablet 1 in the AM for 1 week then 2 in the AM 30 tablet 0   CONTOUR NEXT TEST test strip 3 (three) times daily.     Dulaglutide (TRULICITY) 1.5 CX/4.4YJ SOPN Inject into the skin.     fluvoxaMINE (LUVOX) 100 MG tablet Take 3 tablets (300 mg total) by mouth at bedtime. 270 tablet 0   hyoscyamine (LEVSIN SL) 0.125 MG SL tablet Place 1 tablet (0.125 mg total) under the tongue every 6 (six) hours as needed. 45 tablet 1   insulin aspart (NOVOLOG FLEXPEN) 100 UNIT/ML FlexPen Inject 8 Units into the skin 3 (three) times daily with meals. 15 mL 11   insulin glargine (LANTUS) 100 UNIT/ML Solostar Pen Inject 26 Units into the skin daily. 15 mL 11   lamoTRIgine (LAMICTAL) 150 MG tablet Take 1 tablet (150 mg total) by mouth daily. 90 tablet 3   levothyroxine (SYNTHROID) 75 MCG tablet Take 1 tablet (75 mcg total) by mouth daily. 30 tablet 0   loratadine (CLARITIN) 10 MG tablet Take 10 mg by mouth daily.     LORazepam (ATIVAN) 0.5 MG tablet Take 0.5 mg by mouth every 8 (eight) hours as needed for anxiety.     metFORMIN (GLUCOPHAGE) 500 MG tablet Take 1 tablet (500 mg total) by mouth 2 (two) times daily with a meal. 60 tablet 0   modafinil (PROVIGIL) 200 MG tablet TAKE 1 & 1/2 (ONE & ONE-HALF) TABLETS BY MOUTH ONCE DAILY 45 tablet  3   Multiple Vitamin (MULTI-VITAMIN) tablet Take 1 tablet by mouth daily.     NOVOFINE PEN NEEDLE 32G X 6 MM MISC SMARTSIG:Syringe(s) SUB-Q     Omega-3 1000 MG CAPS Take by mouth.     ondansetron (ZOFRAN) 4 MG tablet Take 1 tablet (4 mg total) by mouth every 4 (four) hours as needed for nausea. 90 tablet 3   pantoprazole (PROTONIX) 40 MG tablet TAKE 1 TABLET BY MOUTH EVERYDAY AT BEDTIME 30 tablet 0   potassium chloride SA (KLOR-CON M) 20 MEQ tablet Take 1 tablet (20 mEq total) by  mouth daily. (Patient not taking: Reported on 08/05/2021) 5 tablet 0   prochlorperazine (COMPAZINE) 10 MG tablet TAKE 1 TABLET BY MOUTH EVERY 6 HOURS AS NEEDED FOR NAUSEA OR VOMITING. 90 tablet 3   QUEtiapine (SEROQUEL) 100 MG tablet TAKE 2 TABLETS BY MOUTH AT BEDTIME. (Patient taking differently: 1 1/2 tab) 60 tablet 2   senna-docusate (SENOKOT-S) 8.6-50 MG tablet Take 2 tablets by mouth at bedtime.     VRAYLAR 3 MG capsule TAKE 1 CAPSULE BY MOUTH DAILY. (Patient taking differently: Every other day) 30 capsule 1   zolpidem (AMBIEN) 5 MG tablet Take 1 tablet (5 mg total) by mouth at bedtime as needed for sleep. 30 tablet 0   No current facility-administered medications on file prior to visit.    Allergies:  Allergies  Allergen Reactions   Clindamycin/Lincomycin Rash   Irinotecan Rash   Leucovorin Rash   Penicillins Rash    Reaction: 10 years   Past Medical History:  Past Medical History:  Diagnosis Date   Anemia    Iron De   Anxiety    Blood clot in vein    Bowel obstruction ([place])    Colon cancer ([place]) 2018   treated with surgery and chemotherapy   Depression    Diabetes mellitus without complication ([place])    Type II   DVT (deep venous thrombosis) ([place]) 2019   behind right knee- while she wasa on chemo   GERD (gastroesophageal reflux disease)    History of blood transfusion    History of chemotherapy    History of kidney stones 2012   passed   Hypothyroidism    Obesity    Past Surgical History:  Past Surgical History:  Procedure Laterality Date   ABDOMINAL HYSTERECTOMY  05/2019   total laparoscopin with tubes and ovariers removed also.   APPENDECTOMY  05/2019   COLON SURGERY  03/2017   Colon resection   COLONOSCOPY  2019   SUBOCCIPITAL CRANIECTOMY CERVICAL LAMINECTOMY Left 05/29/2020   Procedure: Suboccipital Craniectomy for Resection of Left Cerebellar Mass;  Surgeon: Kary Kos, MD;  Location: [facility];  Service: Neurosurgery;  Laterality: Left;   Social History:   Social History   Socioeconomic History   Marital status: Widowed    Spouse name: Not on file   Number of children: Not on file   Years of education: Not on file   Highest education level: Not on file  Occupational History   Occupation: care coordinator   Tobacco Use   Smoking status: Never   Smokeless tobacco: Never  Vaping Use   Vaping Use: Never used  Substance and Sexual Activity   Alcohol use: Yes    Comment: rare   Drug use: Never   Sexual activity: Not Currently  Other Topics Concern   Not on file  Social History Narrative   Not on file   Social Determinants of Health  Financial Resource Strain: Not on file  Food Insecurity: Not on file  Transportation Needs: Not on file  Physical Activity: Not on file  Stress: Not on file  Social Connections: Not on file  Intimate Partner Violence: Not on file   Family History:  Family History  Problem Relation Age of Onset   Irritable bowel syndrome Mother    Colon polyps Father    Breast cancer Maternal Grandmother    Diabetes Maternal Grandmother    Diabetes Maternal Grandfather    Breast cancer Paternal Grandmother    Colon polyps Maternal Uncle    Stomach cancer Neg Hx    Pancreatic cancer Neg Hx    Esophageal cancer Neg Hx    Colon cancer Neg Hx     Review of Systems: Constitutional: +Fatigue Eyes: Doesn't report blurriness of vision Ears, nose, mouth, throat, and face: Doesn't report sore throat Respiratory: Doesn't report cough, dyspnea or wheezes Cardiovascular: Doesn't report palpitation, chest discomfort  Gastrointestinal:  Doesn't report nausea, constipation, diarrhea GU: Doesn't report incontinence Skin: Doesn't report skin rashes Neurological: Per HPI Musculoskeletal: Doesn't report joint pain Behavioral/Psych: +mood issues, depression  Physical Exam: Vitals:   11/10/21 1208  BP: 128/84  Pulse: 94  Resp: 17  Temp: (!) 97.1 F (36.2 C)  SpO2: 95%   KPS: 90. General: Alert, cooperative,  pleasant, in no acute distress Head: Normal EENT: No conjunctival injection or scleral icterus.  Lungs: Resp effort normal Cardiac: Regular rate Abdomen: Non-distended abdomen Skin: No rashes cyanosis or petechiae. Extremities: No clubbing or edema  Neurologic Exam: Mental Status: Awake, alert, attentive to examiner. Oriented to self and environment. Language is fluent with intact comprehension.  Cranial Nerves: Visual acuity is grossly normal. Visual fields are full. Extra-ocular movements intact. No ptosis. Face is symmetric Motor: Tone and bulk are normal. Power is full in both arms and legs. Reflexes are symmetric, no pathologic reflexes present.  Sensory: Intact to light touch Gait: Normal.   Labs: I have reviewed the data as listed    Component Value Date/Time   NA 140 10/24/2021 0000   K 4.1 10/24/2021 0000   CL 105 10/24/2021 0000   CO2 24 (A) 10/24/2021 0000   GLUCOSE 234 (H) 06/07/2020 0525   BUN 17 10/24/2021 0000   CREATININE 1.3 (A) 10/24/2021 0000   CREATININE 0.97 06/07/2020 0525   CALCIUM 9.1 10/24/2021 0000   PROT 5.2 (L) 06/07/2020 0525   ALBUMIN 4.0 10/24/2021 0000   AST 24 10/24/2021 0000   ALT 24 10/24/2021 0000   ALKPHOS 127 (A) 10/24/2021 0000   BILITOT 0.3 06/07/2020 0525   GFRNONAA >60 06/07/2020 0525   Lab Results  Component Value Date   WBC 3.4 10/24/2021   NEUTROABS 1.97 10/24/2021   HGB 12.2 10/24/2021   HCT 38 10/24/2021   MCV 98 09/19/2021   PLT 144 (A) 10/24/2021   Imaging:  [place] Clinician Interpretation: I have personally reviewed the CNS images as listed.  My interpretation, in the context of the patient's clinical presentation, is stable disease  MR Brain W Wo Contrast  Result Date: 11/07/2021 CLINICAL DATA:  Brain metastases, assess treatment response 3T SRS Protocol EXAM: MRI HEAD WITHOUT AND WITH CONTRAST TECHNIQUE: Multiplanar, multiecho pulse sequences of the brain and surrounding structures were obtained without and with  intravenous contrast. CONTRAST:  81m MULTIHANCE GADOBENATE DIMEGLUMINE 529 MG/ML IV SOLN COMPARISON:  08/01/2021 FINDINGS: Brain: Postoperative changes of the left cerebellum. Unchanged small left suboccipital fluid collection. Postoperative enhancement remains present.  There is no new suspicious enhancement. Stable 4 mm right cerebellar enhancing lesion (series 11, image 36). No new mass or abnormal enhancement. No acute infarction or hemorrhage. Chronic blood products at the operative site. No hydrocephalus or extra-axial collection. Minimal small foci of T2 hyperintensity in the supratentorial white matter likely reflect nonspecific gliosis/demyelination. Vascular: Major vessel flow voids at the skull base are preserved. Skull and upper cervical spine: Normal marrow signal is preserved. Sinuses/Orbits: Mild mucosal thickening.  Orbits are unremarkable. Other: Sella is unremarkable.  Mastoid air cells are clear. IMPRESSION: Stable postoperative appearance. Stable small right cerebellar lesion. No evidence of new or progressive metastatic disease. Electronically Signed   By: Macy Mis M.D.   On: 11/07/2021 12:34    Assessment/Plan Malignant neoplasm metastatic to brain [facility]) [C79.31]   Jisselle Poth is clinically and radiographically stable today.    No new or progressive changes.  We appreciate the opportunity to participate in the care of Shannon [name].  She will continue to follow with Dr. Bobby Rumpf for systemic therapy, currently FOLFIRI+avastin.  We ask that Shannon [name] return to clinic in 4 months with brain MRI, or sooner as needed.  All questions were answered. The patient knows to call the clinic with any problems, questions or concerns. No barriers to learning were detected.  The total time spent in the encounter was 30 minutes and more than 50% was on counseling and review of test results   Ventura Sellers, MD Medical Director of  Neuro-Oncology [facility] [place] 11/10/21 12:14 PM

## 2021-11-11 ENCOUNTER — Other Ambulatory Visit: Payer: Self-pay

## 2021-11-11 ENCOUNTER — Inpatient Hospital Stay (INDEPENDENT_AMBULATORY_CARE_PROVIDER_SITE_OTHER): Payer: PRIVATE HEALTH INSURANCE | Admitting: Oncology

## 2021-11-11 ENCOUNTER — Other Ambulatory Visit: Payer: Self-pay | Admitting: Oncology

## 2021-11-11 ENCOUNTER — Telehealth: Payer: Self-pay | Admitting: Internal Medicine

## 2021-11-11 ENCOUNTER — Inpatient Hospital Stay: Payer: PRIVATE HEALTH INSURANCE

## 2021-11-11 VITALS — BP 132/84 | HR 92 | Temp 98.1°F | Resp 16 | Ht 68.0 in | Wt 179.7 lb

## 2021-11-11 DIAGNOSIS — C7931 Secondary malignant neoplasm of brain: Secondary | ICD-10-CM

## 2021-11-11 DIAGNOSIS — C182 Malignant neoplasm of ascending colon: Secondary | ICD-10-CM

## 2021-11-11 DIAGNOSIS — C183 Malignant neoplasm of hepatic flexure: Secondary | ICD-10-CM

## 2021-11-11 LAB — BASIC METABOLIC PANEL
BUN: 8 (ref 4–21)
CO2: 21 (ref 13–22)
Chloride: 109 — AB (ref 99–108)
Creatinine: 1.1 (ref 0.5–1.1)
Glucose: 129
Potassium: 3.2 mEq/L — AB (ref 3.5–5.1)
Sodium: 142 (ref 137–147)

## 2021-11-11 LAB — HEPATIC FUNCTION PANEL
ALT: 18 U/L (ref 7–35)
AST: 21 (ref 13–35)
Alkaline Phosphatase: 125 (ref 25–125)
Bilirubin, Total: 0.4

## 2021-11-11 LAB — CBC AND DIFFERENTIAL
HCT: 38 (ref 36–46)
Hemoglobin: 11.9 — AB (ref 12.0–16.0)
Neutrophils Absolute: 0.69
Platelets: 97 10*3/uL — AB (ref 150–400)
WBC: 2.1

## 2021-11-11 LAB — COMPREHENSIVE METABOLIC PANEL
Albumin: 4 (ref 3.5–5.0)
Calcium: 8.6 — AB (ref 8.7–10.7)

## 2021-11-11 LAB — CBC: RBC: 3.92 (ref 3.87–5.11)

## 2021-11-11 NOTE — Telephone Encounter (Signed)
Per 6/12 los called and left message for pt about appointment.  Details and call back number were left

## 2021-11-12 ENCOUNTER — Other Ambulatory Visit: Payer: Self-pay | Admitting: Radiation Therapy

## 2021-11-12 ENCOUNTER — Inpatient Hospital Stay: Payer: PRIVATE HEALTH INSURANCE

## 2021-11-13 ENCOUNTER — Other Ambulatory Visit: Payer: Self-pay | Admitting: Radiation Therapy

## 2021-11-14 ENCOUNTER — Other Ambulatory Visit: Payer: Self-pay | Admitting: Psychiatry

## 2021-11-14 ENCOUNTER — Ambulatory Visit: Payer: PRIVATE HEALTH INSURANCE | Admitting: Oncology

## 2021-11-14 ENCOUNTER — Other Ambulatory Visit: Payer: PRIVATE HEALTH INSURANCE

## 2021-11-14 DIAGNOSIS — F333 Major depressive disorder, recurrent, severe with psychotic symptoms: Secondary | ICD-10-CM

## 2021-11-17 ENCOUNTER — Inpatient Hospital Stay: Payer: PRIVATE HEALTH INSURANCE

## 2021-11-17 ENCOUNTER — Other Ambulatory Visit: Payer: Self-pay

## 2021-11-17 DIAGNOSIS — Z5111 Encounter for antineoplastic chemotherapy: Secondary | ICD-10-CM | POA: Diagnosis not present

## 2021-11-17 DIAGNOSIS — C182 Malignant neoplasm of ascending colon: Secondary | ICD-10-CM

## 2021-11-17 LAB — CBC: RBC: 4.15 (ref 3.87–5.11)

## 2021-11-17 LAB — TOTAL PROTEIN, URINE DIPSTICK: Protein, ur: NEGATIVE mg/dL

## 2021-11-17 LAB — CBC AND DIFFERENTIAL
HCT: 40 (ref 36–46)
Hemoglobin: 13 (ref 12.0–16.0)
Neutrophils Absolute: 2.41
Platelets: 146 10*3/uL — AB (ref 150–400)
WBC: 4.3

## 2021-11-18 ENCOUNTER — Other Ambulatory Visit: Payer: Self-pay | Admitting: Oncology

## 2021-11-18 ENCOUNTER — Ambulatory Visit: Payer: PRIVATE HEALTH INSURANCE

## 2021-11-18 MED FILL — Leucovorin Calcium For Inj 350 MG: INTRAMUSCULAR | Qty: 41.8 | Status: AC

## 2021-11-18 MED FILL — Bevacizumab-bvzr IV Soln 400 MG/16ML (For Infusion): INTRAVENOUS | Qty: 16 | Status: AC

## 2021-11-18 MED FILL — Fluorouracil IV Soln 2.5 GM/50ML (50 MG/ML): INTRAVENOUS | Qty: 17 | Status: AC

## 2021-11-18 MED FILL — Fluorouracil IV Soln 5 GM/100ML (50 MG/ML): INTRAVENOUS | Qty: 100 | Status: AC

## 2021-11-18 MED FILL — Dexamethasone Sodium Phosphate Inj 100 MG/10ML: INTRAMUSCULAR | Qty: 1 | Status: AC

## 2021-11-18 MED FILL — Fosaprepitant Dimeglumine For IV Infusion 150 MG (Base Eq): INTRAVENOUS | Qty: 5 | Status: AC

## 2021-11-18 MED FILL — Irinotecan HCl Inj 100 MG/5ML (20 MG/ML): INTRAVENOUS | Qty: 14 | Status: AC

## 2021-11-19 ENCOUNTER — Encounter: Payer: Self-pay | Admitting: Oncology

## 2021-11-19 ENCOUNTER — Inpatient Hospital Stay: Payer: PRIVATE HEALTH INSURANCE

## 2021-11-19 ENCOUNTER — Encounter: Payer: Self-pay | Admitting: Pharmacist

## 2021-11-19 VITALS — BP 135/79 | HR 82 | Temp 97.9°F | Resp 16 | Wt 177.0 lb

## 2021-11-19 DIAGNOSIS — Z5111 Encounter for antineoplastic chemotherapy: Secondary | ICD-10-CM | POA: Diagnosis not present

## 2021-11-19 DIAGNOSIS — C182 Malignant neoplasm of ascending colon: Secondary | ICD-10-CM

## 2021-11-19 MED ORDER — DIPHENHYDRAMINE HCL 50 MG/ML IJ SOLN
25.0000 mg | Freq: Once | INTRAMUSCULAR | Status: AC
  Administered 2021-11-19: 25 mg via INTRAVENOUS

## 2021-11-19 MED ORDER — HEPARIN SOD (PORK) LOCK FLUSH 100 UNIT/ML IV SOLN: 500.0000 [IU] | Freq: Once | INTRAVENOUS | Status: DC | PRN

## 2021-11-19 MED ORDER — FAMOTIDINE IN NACL 20-0.9 MG/50ML-% IV SOLN
20.0000 mg | Freq: Once | INTRAVENOUS | Status: AC
  Administered 2021-11-19: 20 mg via INTRAVENOUS

## 2021-11-19 MED ORDER — FAMOTIDINE IN NACL 20-0.9 MG/50ML-% IV SOLN
20.0000 mg | Freq: Once | INTRAVENOUS | Status: AC
  Administered 2021-11-19: 20 mg via INTRAVENOUS
  Filled 2021-11-19: qty 50

## 2021-11-19 MED ORDER — DIPHENHYDRAMINE HCL 50 MG/ML IJ SOLN
25.0000 mg | Freq: Once | INTRAMUSCULAR | Status: AC
  Administered 2021-11-19: 25 mg via INTRAVENOUS
  Filled 2021-11-19: qty 1

## 2021-11-19 MED ORDER — FLUOROURACIL CHEMO INJECTION 2.5 GM/50ML
400.0000 mg/m2 | Freq: Once | INTRAVENOUS | Status: AC
  Administered 2021-11-19: 850 mg via INTRAVENOUS
  Filled 2021-11-19: qty 17

## 2021-11-19 MED ORDER — SODIUM CHLORIDE 0.9 % IV SOLN
150.0000 mg | Freq: Once | INTRAVENOUS | Status: AC
  Administered 2021-11-19: 150 mg via INTRAVENOUS
  Filled 2021-11-19: qty 150
  Filled 2021-11-19: qty 5

## 2021-11-19 MED ORDER — SODIUM CHLORIDE 0.9 % IV SOLN
2400.0000 mg/m2 | INTRAVENOUS | Status: DC
  Administered 2021-11-19: 5000 mg via INTRAVENOUS
  Filled 2021-11-19: qty 100

## 2021-11-19 MED ORDER — IRINOTECAN HCL CHEMO INJECTION 100 MG/5ML
135.0000 mg/m2 | Freq: Once | INTRAVENOUS | Status: AC
  Administered 2021-11-19: 280 mg via INTRAVENOUS
  Filled 2021-11-19: qty 10

## 2021-11-19 MED ORDER — ATROPINE SULFATE 1 MG/ML IV SOLN
0.5000 mg | Freq: Once | INTRAVENOUS | Status: AC | PRN
  Administered 2021-11-19: 0.5 mg via INTRAVENOUS
  Filled 2021-11-19: qty 1

## 2021-11-19 MED ORDER — SODIUM CHLORIDE 0.9% FLUSH: 10.0000 mL | INTRAVENOUS | Status: DC | PRN

## 2021-11-19 MED ORDER — PALONOSETRON HCL INJECTION 0.25 MG/5ML
0.2500 mg | Freq: Once | INTRAVENOUS | Status: AC
  Administered 2021-11-19: 0.25 mg via INTRAVENOUS
  Filled 2021-11-19: qty 5

## 2021-11-19 MED ORDER — SODIUM CHLORIDE 0.9 % IV SOLN: Freq: Once | INTRAVENOUS | Status: AC

## 2021-11-19 MED ORDER — SODIUM CHLORIDE 0.9 % IV SOLN
10.0000 mg | Freq: Once | INTRAVENOUS | Status: AC
  Administered 2021-11-19: 10 mg via INTRAVENOUS
  Filled 2021-11-19: qty 1
  Filled 2021-11-19: qty 10

## 2021-11-19 MED ORDER — SODIUM CHLORIDE 0.9 % IV SOLN
5.0000 mg/kg | Freq: Once | INTRAVENOUS | Status: AC
  Administered 2021-11-19: 400 mg via INTRAVENOUS
  Filled 2021-11-19: qty 16

## 2021-11-19 MED ORDER — LEUCOVORIN CALCIUM INJECTION 350 MG
400.0000 mg/m2 | Freq: Once | INTRAVENOUS | Status: AC
  Administered 2021-11-19: 836 mg via INTRAVENOUS
  Filled 2021-11-19: qty 41.8

## 2021-11-19 NOTE — Progress Notes (Signed)
1335- Flushing and redness has returned to neck and face- Irinotecan and Leucovorin stopped. See MAR for medications given as directed by Delmer Islam D. Patient has no complaints, Denies nausea, itching or other S/S.

## 2021-11-19 NOTE — Patient Instructions (Signed)
[facility]  Discharge Instructions: Thank you for choosing [facility] to provide your oncology and hematology care.  If you have a lab appointment with the [facility], please go directly to the [facility] and check in at the registration area.   Wear comfortable clothing and clothing appropriate for easy access to any Portacath or PICC line.   We strive to give you quality time with your provider. You may need to reschedule your appointment if you arrive late (15 or more minutes).  Arriving late affects you and other patients whose appointments are after yours.  Also, if you miss three or more appointments without notifying the office, you may be dismissed from the clinic at the provider's discretion.      For prescription refill requests, have your pharmacy contact our office and allow 72 hours for refills to be completed.    Today you received the following chemotherapy and/or immunotherapy agents: 87f, Leucovorin, IrontecanFluorouracil, 5-FU injection What is this medication? FLUOROURACIL, 5-FU (flure oh YOOR a sil) is a chemotherapy drug. It slows the growth of cancer cells. This medicine is used to treat many types of cancer like breast cancer, colon or rectal cancer, pancreatic cancer, and stomach cancer. This medicine may be used for other purposes; ask your health care provider or pharmacist if you have questions. COMMON BRAND NAME(S): Adrucil What should I tell my care team before I take this medication? They need to know if you have any of these conditions: blood disorders dihydropyrimidine dehydrogenase (DPD) deficiency infection (especially a virus infection such as chickenpox, cold sores, or herpes) kidney disease liver disease malnourished, poor nutrition recent or ongoing radiation therapy an unusual or allergic reaction to fluorouracil, other chemotherapy, other medicines, foods, dyes, or preservatives pregnant or trying to get  pregnant breast-feeding How should I use this medication? This drug is given as an infusion or injection into a vein. It is administered in a hospital or clinic by a specially trained health care professional. Talk to your pediatrician regarding the use of this medicine in children. Special care may be needed. Overdosage: If you think you have taken too much of this medicine contact a poison control center or emergency room at once. NOTE: This medicine is only for you. Do not share this medicine with others. What if I miss a dose? It is important not to miss your dose. Call your doctor or health care professional if you are unable to keep an appointment. What may interact with this medication? Do not take this medicine with any of the following medications: live virus vaccines This medicine may also interact with the following medications: medicines that treat or prevent blood clots like warfarin, enoxaparin, and dalteparin This list may not describe all possible interactions. Give your health care provider a list of all the medicines, herbs, non-prescription drugs, or dietary supplements you use. Also tell them if you smoke, drink alcohol, or use illegal drugs. Some items may interact with your medicine. What should I watch for while using this medication? Visit your doctor for checks on your progress. This drug may make you feel generally unwell. This is not uncommon, as chemotherapy can affect healthy cells as well as cancer cells. Report any side effects. Continue your course of treatment even though you feel ill unless your doctor tells you to stop. In some cases, you may be given additional medicines to help with side effects. Follow all directions for their use. Call your doctor  or health care professional for advice if you get a fever, chills or sore throat, or other symptoms of a cold or flu. Do not treat yourself. This drug decreases your body's ability to fight infections. Try to avoid  being around people who are sick. This medicine may increase your risk to bruise or bleed. Call your doctor or health care professional if you notice any unusual bleeding. Be careful brushing and flossing your teeth or using a toothpick because you may get an infection or bleed more easily. If you have any dental work done, tell your dentist you are receiving this medicine. Avoid taking products that contain aspirin, acetaminophen, ibuprofen, naproxen, or ketoprofen unless instructed by your doctor. These medicines may hide a fever. Do not become pregnant while taking this medicine. Women should inform their doctor if they wish to become pregnant or think they might be pregnant. There is a potential for serious side effects to an unborn child. Talk to your health care professional or pharmacist for more information. Do not breast-feed an infant while taking this medicine. Men should inform their doctor if they wish to father a child. This medicine may lower sperm counts. Do not treat diarrhea with over the counter products. Contact your doctor if you have diarrhea that lasts more than 2 days or if it is severe and watery. This medicine can make you more sensitive to the sun. Keep out of the sun. If you cannot avoid being in the sun, wear protective clothing and use sunscreen. Do not use sun lamps or tanning beds/booths. What side effects may I notice from receiving this medication? Side effects that you should report to your doctor or health care professional as soon as possible: allergic reactions like skin rash, itching or hives, swelling of the face, lips, or tongue low blood counts - this medicine may decrease the number of white blood cells, red blood cells and platelets. You may be at increased risk for infections and bleeding. signs of infection - fever or chills, cough, sore throat, pain or difficulty passing urine signs of decreased platelets or bleeding - bruising, pinpoint red spots on the  skin, black, tarry stools, blood in the urine signs of decreased red blood cells - unusually weak or tired, fainting spells, lightheadedness breathing problems changes in vision chest pain mouth sores nausea and vomiting pain, swelling, redness at site where injected pain, tingling, numbness in the hands or feet redness, swelling, or sores on hands or feet stomach pain unusual bleeding Side effects that usually do not require medical attention (report to your doctor or health care professional if they continue or are bothersome): changes in finger or toe nails diarrhea dry or itchy skin hair loss headache loss of appetite sensitivity of eyes to the light stomach upset unusually teary eyes This list may not describe all possible side effects. Call your doctor for medical advice about side effects. You may report side effects to FDA at 1-800-FDA-1088. Where should I keep my medication? This drug is given in a hospital or clinic and will not be stored at home. NOTE: This sheet is a summary. It may not cover all possible information. If you have questions about this medicine, talk to your doctor, pharmacist, or health care provider.  2023 Elsevier/Gold Standard (2021-04-18 00:00:00) Irinotecan injection What is this medication? IRINOTECAN (ir in oh TEE kan ) is a chemotherapy drug. It is used to treat colon and rectal cancer. This medicine may be used for other purposes; ask your  health care provider or pharmacist if you have questions. COMMON BRAND NAME(S): Camptosar What should I tell my care team before I take this medication? They need to know if you have any of these conditions: dehydration diarrhea infection (especially a virus infection such as chickenpox, cold sores, or herpes) liver disease low blood counts, like low white cell, platelet, or red cell counts low levels of calcium, magnesium, or potassium in the blood recent or ongoing radiation therapy an unusual or  allergic reaction to irinotecan, other medicines, foods, dyes, or preservatives pregnant or trying to get pregnant breast-feeding How should I use this medication? This drug is given as an infusion into a vein. It is administered in a hospital or clinic by a specially trained health care professional. Talk to your pediatrician regarding the use of this medicine in children. Special care may be needed. Overdosage: If you think you have taken too much of this medicine contact a poison control center or emergency room at once. NOTE: This medicine is only for you. Do not share this medicine with others. What if I miss a dose? It is important not to miss your dose. Call your doctor or health care professional if you are unable to keep an appointment. What may interact with this medication? Do not take this medicine with any of the following medications: cobicistat itraconazole This medicine may interact with the following medications: antiviral medicines for HIV or AIDS certain antibiotics like rifampin or rifabutin certain medicines for fungal infections like ketoconazole, posaconazole, and voriconazole certain medicines for seizures like carbamazepine, phenobarbital, phenotoin clarithromycin gemfibrozil nefazodone St. John's Wort This list may not describe all possible interactions. Give your health care provider a list of all the medicines, herbs, non-prescription drugs, or dietary supplements you use. Also tell them if you smoke, drink alcohol, or use illegal drugs. Some items may interact with your medicine. What should I watch for while using this medication? Your condition will be monitored carefully while you are receiving this medicine. You will need important blood work done while you are taking this medicine. This drug may make you feel generally unwell. This is not uncommon, as chemotherapy can affect healthy cells as well as cancer cells. Report any side effects. Continue your  course of treatment even though you feel ill unless your doctor tells you to stop. In some cases, you may be given additional medicines to help with side effects. Follow all directions for their use. You may get drowsy or dizzy. Do not drive, use machinery, or do anything that needs mental alertness until you know how this medicine affects you. Do not stand or sit up quickly, especially if you are an older patient. This reduces the risk of dizzy or fainting spells. Call your health care professional for advice if you get a fever, chills, or sore throat, or other symptoms of a cold or flu. Do not treat yourself. This medicine decreases your body's ability to fight infections. Try to avoid being around people who are sick. Avoid taking products that contain aspirin, acetaminophen, ibuprofen, naproxen, or ketoprofen unless instructed by your doctor. These medicines may hide a fever. This medicine may increase your risk to bruise or bleed. Call your doctor or health care professional if you notice any unusual bleeding. Be careful brushing and flossing your teeth or using a toothpick because you may get an infection or bleed more easily. If you have any dental work done, tell your dentist you are receiving this medicine. Do not become  pregnant while taking this medicine or for 6 months after stopping it. Women should inform their health care professional if they wish to become pregnant or think they might be pregnant. Men should not father a child while taking this medicine and for 3 months after stopping it. There is potential for serious side effects to an unborn child. Talk to your health care professional for more information. Do not breast-feed an infant while taking this medicine or for 7 days after stopping it. This medicine has caused ovarian failure in some women. This medicine may make it more difficult to get pregnant. Talk to your health care professional if you are concerned about your  fertility. This medicine has caused decreased sperm counts in some men. This may make it more difficult to father a child. Talk to your health care professional if you are concerned about your fertility. What side effects may I notice from receiving this medication? Side effects that you should report to your doctor or health care professional as soon as possible: allergic reactions like skin rash, itching or hives, swelling of the face, lips, or tongue chest pain diarrhea flushing, runny nose, sweating during infusion low blood counts - this medicine may decrease the number of white blood cells, red blood cells and platelets. You may be at increased risk for infections and bleeding. nausea, vomiting pain, swelling, warmth in the leg signs of decreased platelets or bleeding - bruising, pinpoint red spots on the skin, black, tarry stools, blood in the urine signs of infection - fever or chills, cough, sore throat, pain or difficulty passing urine signs of decreased red blood cells - unusually weak or tired, fainting spells, lightheadedness Side effects that usually do not require medical attention (report to your doctor or health care professional if they continue or are bothersome): constipation hair loss headache loss of appetite mouth sores stomach pain This list may not describe all possible side effects. Call your doctor for medical advice about side effects. You may report side effects to FDA at 1-800-FDA-1088. Where should I keep my medication? This drug is given in a hospital or clinic and will not be stored at home. NOTE: This sheet is a summary. It may not cover all possible information. If you have questions about this medicine, talk to your doctor, pharmacist, or health care provider.  2023 Elsevier/Gold Standard (2021-04-18 00:00:00) Leucovorin injection What is this medication? LEUCOVORIN (loo koe VOR in) is used to prevent or treat the harmful effects of some medicines.  This medicine is used to treat anemia caused by a low amount of folic acid in the body. It is also used with 5-fluorouracil (5-FU) to treat colon cancer. This medicine may be used for other purposes; ask your health care provider or pharmacist if you have questions. What should I tell my care team before I take this medication? They need to know if you have any of these conditions: anemia from low levels of vitamin B-12 in the blood an unusual or allergic reaction to leucovorin, folic acid, other medicines, foods, dyes, or preservatives pregnant or trying to get pregnant breast-feeding How should I use this medication? This medicine is for injection into a muscle or into a vein. It is given by a health care professional in a hospital or clinic setting. Talk to your pediatrician regarding the use of this medicine in children. Special care may be needed. Overdosage: If you think you have taken too much of this medicine contact a poison control center or  emergency room at once. NOTE: This medicine is only for you. Do not share this medicine with others. What if I miss a dose? This does not apply. What may interact with this medication? capecitabine fluorouracil phenobarbital phenytoin primidone trimethoprim-sulfamethoxazole This list may not describe all possible interactions. Give your health care provider a list of all the medicines, herbs, non-prescription drugs, or dietary supplements you use. Also tell them if you smoke, drink alcohol, or use illegal drugs. Some items may interact with your medicine. What should I watch for while using this medication? Your condition will be monitored carefully while you are receiving this medicine. This medicine may increase the side effects of 5-fluorouracil, 5-FU. Tell your doctor or health care professional if you have diarrhea or mouth sores that do not get better or that get worse. What side effects may I notice from receiving this medication? Side  effects that you should report to your doctor or health care professional as soon as possible: allergic reactions like skin rash, itching or hives, swelling of the face, lips, or tongue breathing problems fever, infection mouth sores unusual bleeding or bruising unusually weak or tired Side effects that usually do not require medical attention (report to your doctor or health care professional if they continue or are bothersome): constipation or diarrhea loss of appetite nausea, vomiting This list may not describe all possible side effects. Call your doctor for medical advice about side effects. You may report side effects to FDA at 1-800-FDA-1088. Where should I keep my medication? This drug is given in a hospital or clinic and will not be stored at home. NOTE: This sheet is a summary. It may not cover all possible information. If you have questions about this medicine, talk to your doctor, pharmacist, or health care provider.  2023 Elsevier/Gold Standard (2007-11-24 00:00:00)       To help prevent nausea and vomiting after your treatment, we encourage you to take your nausea medication as directed.  BELOW ARE SYMPTOMS THAT SHOULD BE REPORTED IMMEDIATELY: *FEVER GREATER THAN 100.4 F (38 C) OR HIGHER *CHILLS OR SWEATING *NAUSEA AND VOMITING THAT IS NOT CONTROLLED WITH YOUR NAUSEA MEDICATION *UNUSUAL SHORTNESS OF BREATH *UNUSUAL BRUISING OR BLEEDING *URINARY PROBLEMS (pain or burning when urinating, or frequent urination) *BOWEL PROBLEMS (unusual diarrhea, constipation, pain near the anus) TENDERNESS IN MOUTH AND THROAT WITH OR WITHOUT PRESENCE OF ULCERS (sore throat, sores in mouth, or a toothache) UNUSUAL RASH, SWELLING OR PAIN  UNUSUAL VAGINAL DISCHARGE OR ITCHING   Items with * indicate a potential emergency and should be followed up as soon as possible or go to the Emergency Department if any problems should occur.  Please show the CHEMOTHERAPY ALERT CARD or IMMUNOTHERAPY  ALERT CARD at check-in to the Emergency Department and triage nurse.  Should you have questions after your visit or need to cancel or reschedule your appointment, please contact [facility]  Dept: [phone]  and follow the prompts.  Office hours are 8:00 a.m. to 4:30 p.m. Monday - Friday. Please note that voicemails left after 4:00 p.m. may not be returned until the following business day.  We are closed weekends and major holidays. You have access to a nurse at all times for urgent questions. Please call the main number to the clinic Dept: [phone] and follow the prompts.  For any non-urgent questions, you may also contact your provider using MyChart. We now offer e-Visits for anyone 85 and older to request care online for non-urgent symptoms.  For details visit [email or web address].   Also download the MyChart app! Go to the app store, search "MyChart", open the app, select [facility], and log in with your MyChart username and password.  Masks are optional in the cancer centers. If you would like for your care team to wear a mask while they are taking care of you, please let them know. For doctor visits, patients may have with them one support person who is at least 54 years old. At this time, visitors are not allowed in the infusion area.

## 2021-11-19 NOTE — Progress Notes (Signed)
Irinotecan increased to 264m/hr, No redness or flushing noted. Leucovorin remains on hold

## 2021-11-19 NOTE — Progress Notes (Signed)
Flushing and redness noted around eyes and cheeks- Leucovorin stopped. Ulice Dash at bedside- Clean NS line hung and Irinotecan continued at 225cc/hr.

## 2021-11-19 NOTE — Progress Notes (Signed)
1407- Irinotecan restarted at 200cc/hr. Leucovorin on hold at this time

## 2021-11-19 NOTE — Progress Notes (Signed)
Leucovorin restarted at 1547- No redness or flushing noted at this time

## 2021-11-19 NOTE — Progress Notes (Signed)
1256- Skin flushed, neck and face. Ulice Dash aware and chemo stopped. Leucovorin and Irinotecan started at half the original rate at 1312. Leucovorin at 100 ml/hr and Irinotecan at 171 ml/hr. Per Manuela Schwartz Phy's direction

## 2021-11-19 NOTE — Progress Notes (Signed)
Mild flushing noted to nose, forehead and cheeks- Patient states she feels slightly "hot". Ulice Dash at bedside. No acurte distress noted.

## 2021-11-19 NOTE — Progress Notes (Signed)
Patient less flushed at present, Denies itching, nausea or other symptoms. States hot feeling is fading. Ambulatory without difficulty. Discharged home with home pump intact

## 2021-11-21 ENCOUNTER — Other Ambulatory Visit: Payer: Self-pay | Admitting: Psychiatry

## 2021-11-21 ENCOUNTER — Inpatient Hospital Stay: Payer: PRIVATE HEALTH INSURANCE

## 2021-11-21 ENCOUNTER — Telehealth: Payer: Self-pay

## 2021-11-21 VITALS — BP 103/77 | HR 99 | Temp 97.6°F | Resp 18 | Wt 173.0 lb

## 2021-11-21 DIAGNOSIS — Z5111 Encounter for antineoplastic chemotherapy: Secondary | ICD-10-CM | POA: Diagnosis not present

## 2021-11-21 DIAGNOSIS — F422 Mixed obsessional thoughts and acts: Secondary | ICD-10-CM

## 2021-11-21 DIAGNOSIS — D702 Other drug-induced agranulocytosis: Secondary | ICD-10-CM

## 2021-11-21 DIAGNOSIS — F333 Major depressive disorder, recurrent, severe with psychotic symptoms: Secondary | ICD-10-CM

## 2021-11-21 MED ORDER — HEPARIN SOD (PORK) LOCK FLUSH 100 UNIT/ML IV SOLN
500.0000 [IU] | Freq: Once | INTRAVENOUS | Status: AC | PRN
  Administered 2021-11-21: 500 [IU]

## 2021-11-21 MED ORDER — SODIUM CHLORIDE 0.9% FLUSH
10.0000 mL | INTRAVENOUS | Status: DC | PRN
  Administered 2021-11-21: 10 mL

## 2021-11-25 ENCOUNTER — Ambulatory Visit: Payer: PRIVATE HEALTH INSURANCE

## 2021-11-26 ENCOUNTER — Inpatient Hospital Stay: Payer: PRIVATE HEALTH INSURANCE

## 2021-11-26 ENCOUNTER — Ambulatory Visit: Payer: PRIVATE HEALTH INSURANCE

## 2021-11-26 VITALS — BP 108/75 | HR 95 | Temp 98.0°F | Resp 18 | Ht 68.0 in | Wt 173.1 lb

## 2021-11-26 DIAGNOSIS — C182 Malignant neoplasm of ascending colon: Secondary | ICD-10-CM

## 2021-11-26 DIAGNOSIS — Z5111 Encounter for antineoplastic chemotherapy: Secondary | ICD-10-CM | POA: Diagnosis not present

## 2021-11-26 MED ORDER — FILGRASTIM-SNDZ 480 MCG/0.8ML IJ SOSY
480.0000 ug | PREFILLED_SYRINGE | Freq: Once | INTRAMUSCULAR | Status: AC
  Administered 2021-11-26: 480 ug via SUBCUTANEOUS
  Filled 2021-11-26: qty 0.8

## 2021-11-26 NOTE — Patient Instructions (Signed)
Filgrastim, G-CSF injection ?What is this medication? ?FILGRASTIM, G-CSF (fil GRA stim) is a granulocyte colony-stimulating factor that stimulates the growth of neutrophils, a type of white blood cell (WBC) important in the body's fight against infection. It is used to reduce the incidence of fever and infection in patients with certain types of cancer who are receiving chemotherapy that affects the bone marrow, to stimulate blood cell production for removal of WBCs from the body prior to a bone marrow transplantation, to reduce the incidence of fever and infection in patients who have severe chronic neutropenia, and to improve survival outcomes following high-dose radiation exposure that is toxic to the bone marrow. ?This medicine may be used for other purposes; ask your health care provider or pharmacist if you have questions. ?COMMON BRAND NAME(S): Neupogen, Nivestym, Releuko, Zarxio ?What should I tell my care team before I take this medication? ?They need to know if you have any of these conditions: ?kidney disease ?latex allergy ?ongoing radiation therapy ?sickle cell disease ?an unusual or allergic reaction to filgrastim, pegfilgrastim, other medicines, foods, dyes, or preservatives ?pregnant or trying to get pregnant ?breast-feeding ?How should I use this medication? ?This medicine is for injection under the skin or infusion into a vein. As an infusion into a vein, it is usually given by a health care professional in a hospital or clinic setting. If you get this medicine at home, you will be taught how to prepare and give this medicine. Refer to the Instructions for Use that come with your medication packaging. Use exactly as directed. Take your medicine at regular intervals. Do not take your medicine more often than directed. ?It is important that you put your used needles and syringes in a special sharps container. Do not put them in a trash can. If you do not have a sharps container, call your pharmacist  or healthcare provider to get one. ?Talk to your pediatrician regarding the use of this medicine in children. While this drug may be prescribed for children as young as 7 months for selected conditions, precautions do apply. ?Overdosage: If you think you have taken too much of this medicine contact a poison control center or emergency room at once. ?NOTE: This medicine is only for you. Do not share this medicine with others. ?What if I miss a dose? ?It is important not to miss your dose. Call your doctor or health care professional if you miss a dose. ?What may interact with this medication? ?This medicine may interact with the following medications: ?medicines that may cause a release of neutrophils, such as lithium ?This list may not describe all possible interactions. Give your health care provider a list of all the medicines, herbs, non-prescription drugs, or dietary supplements you use. Also tell them if you smoke, drink alcohol, or use illegal drugs. Some items may interact with your medicine. ?What should I watch for while using this medication? ?Your condition will be monitored carefully while you are receiving this medicine. ?You may need blood work done while you are taking this medicine. ?Talk to your health care provider about your risk of cancer. You may be more at risk for certain types of cancer if you take this medicine. ?What side effects may I notice from receiving this medication? ?Side effects that you should report to your doctor or health care professional as soon as possible: ?allergic reactions like skin rash, itching or hives, swelling of the face, lips, or tongue ?back pain ?dizziness or feeling faint ?fever ?pain, redness, or   irritation at site where injected ?pinpoint red spots on the skin ?shortness of breath or breathing problems ?signs and symptoms of kidney injury like trouble passing urine, change in the amount of urine, or red or dark-brown urine ?stomach or side pain, or pain at  the shoulder ?swelling ?tiredness ?unusual bleeding or bruising ?Side effects that usually do not require medical attention (report to your doctor or health care professional if they continue or are bothersome): ?bone pain ?cough ?diarrhea ?hair loss ?headache ?muscle pain ?This list may not describe all possible side effects. Call your doctor for medical advice about side effects. You may report side effects to FDA at 1-800-FDA-1088. ?Where should I keep my medication? ?Keep out of the reach of children. ?Store in a refrigerator between 2 and 8 degrees C (36 and 46 degrees F). Do not freeze. Keep in carton to protect from light. Throw away this medicine if vials or syringes are left out of the refrigerator for more than 24 hours. Throw away any unused medicine after the expiration date. ?NOTE: This sheet is a summary. It may not cover all possible information. If you have questions about this medicine, talk to your doctor, pharmacist, or health care provider. ?? 2023 Elsevier/Gold Standard (2021-04-18 00:00:00) ? ?

## 2021-11-27 ENCOUNTER — Inpatient Hospital Stay: Payer: PRIVATE HEALTH INSURANCE

## 2021-11-27 ENCOUNTER — Ambulatory Visit: Payer: PRIVATE HEALTH INSURANCE

## 2021-11-27 VITALS — BP 125/86 | HR 101 | Temp 97.6°F | Resp 18 | Ht 68.0 in | Wt 174.5 lb

## 2021-11-27 DIAGNOSIS — C182 Malignant neoplasm of ascending colon: Secondary | ICD-10-CM

## 2021-11-27 DIAGNOSIS — Z5111 Encounter for antineoplastic chemotherapy: Secondary | ICD-10-CM | POA: Diagnosis not present

## 2021-11-27 LAB — BASIC METABOLIC PANEL
BUN: 17 (ref 4–21)
CO2: 24 — AB (ref 13–22)
Chloride: 104 (ref 99–108)
Creatinine: 1.3 — AB (ref 0.5–1.1)
Glucose: 167
Potassium: 3.9 mEq/L (ref 3.5–5.1)
Sodium: 138 (ref 137–147)

## 2021-11-27 LAB — CBC AND DIFFERENTIAL
HCT: 39 (ref 36–46)
Hemoglobin: 12.7 (ref 12.0–16.0)
Neutrophils Absolute: 6.15
Platelets: 112 10*3/uL — AB (ref 150–400)
WBC: 8.2

## 2021-11-27 LAB — COMPREHENSIVE METABOLIC PANEL
Albumin: 4.3 (ref 3.5–5.0)
Calcium: 9.1 (ref 8.7–10.7)

## 2021-11-27 LAB — CBC: RBC: 4.13 (ref 3.87–5.11)

## 2021-11-27 LAB — HEPATIC FUNCTION PANEL
ALT: 22 U/L (ref 7–35)
AST: 23 (ref 13–35)
Alkaline Phosphatase: 132 — AB (ref 25–125)
Bilirubin, Total: 0.6

## 2021-11-27 MED ORDER — FILGRASTIM-SNDZ 480 MCG/0.8ML IJ SOSY
480.0000 ug | PREFILLED_SYRINGE | Freq: Once | INTRAMUSCULAR | Status: AC
  Administered 2021-11-27: 480 ug via SUBCUTANEOUS
  Filled 2021-11-27: qty 0.8

## 2021-11-27 NOTE — Progress Notes (Cosign Needed)
[place]  [address] [place],  [place]  [id number] [phone]  Clinic Day:  11/28/2021  Referring physician: Greig Right, MD   HISTORY OF PRESENT ILLNESS:  The patient is a 56 y.o. female with  metastatic colon cancer, which includes spread of disease to her abdominal cavity, lungs, brain and right adrenal gland. She comes in today prior to her 31st cycle of palliative FOLFIRI/Avastin chemotherapy.  The patient claims to have tolerated her 30th cycle of treatment fairly well.  However, she still had facial redness with her last cycle of treatment to where histamine blocking therapy was necessary, which once again alleviated her facial flushing.  She continues to report intermittent diarrhea, but is using Imodium as varyingly.  She has also had nausea for which Compazine is effective, but this has limited her eating less and losing weight.  She uses nutritional supplements when she does not feel like eating.  She feels she is drinking plenty of fluids, but reports dizziness with standing.  She continues to deny having any new symptoms or findings which concern her for overt signs of disease progression.   With respect to her colon cancer history, she is status post a right hemicolectomy in early October 2018, followed by 12 cycles of adjuvant FOLFOX chemotherapy, which were completed in April 2019.  In December 2021, she underwent a left cerebellar metastasectomy, whose pathology was consistent with metastatic colon cancer.  Tumor testing did come back MMR normal.  CT scans revealed evidence of her cancer being in multiple locations, for which she has been on FOLFIRI/Avastin.  PHYSICAL EXAM:  Blood pressure 110/75, pulse (!) 102, temperature 98.2 F (36.8 C), resp. rate 16, height 5' 8" (1.727 m), weight 172 lb 12.8 oz (78.4 kg), last menstrual period 02/22/2019, SpO2 95 %. Wt Readings from Last 3 Encounters:  11/28/21 172 lb 12.8 oz (78.4 kg)  11/27/21  174 lb 8 oz (79.2 kg)  11/26/21 173 lb 1.3 oz (78.5 kg)   Body mass index is 26.27 kg/m.  Performance status (ECOG): 1 - Symptomatic but completely ambulatory  Physical Exam Vitals and nursing note reviewed.  Constitutional:      General: She is not in acute distress.    Appearance: Normal appearance.  HENT:     Head: Normocephalic and atraumatic.     Mouth/Throat:     Mouth: Mucous membranes are moist.     Pharynx: Oropharynx is clear. No oropharyngeal exudate or posterior oropharyngeal erythema.  Eyes:     General: No scleral icterus.    Extraocular Movements: Extraocular movements intact.     Conjunctiva/sclera: Conjunctivae normal.     Pupils: Pupils are equal, round, and reactive to light.  Cardiovascular:     Rate and Rhythm: Normal rate and regular rhythm.     Heart sounds: Normal heart sounds. No murmur heard.    No friction rub. No gallop.  Pulmonary:     Effort: Pulmonary effort is normal.     Breath sounds: Normal breath sounds. No wheezing, rhonchi or rales.  Abdominal:     General: There is no distension.     Palpations: Abdomen is soft. There is no hepatomegaly, splenomegaly or mass.     Tenderness: There is no abdominal tenderness.  Musculoskeletal:        General: Normal range of motion.     Cervical back: Normal range of motion and neck supple. No tenderness.     Right lower leg: No edema.  Left lower leg: No edema.  Lymphadenopathy:     Cervical: No cervical adenopathy.     Upper Body:     Right upper body: No supraclavicular or axillary adenopathy.     Left upper body: No supraclavicular or axillary adenopathy.     Lower Body: No right inguinal adenopathy. No left inguinal adenopathy.  Skin:    General: Skin is warm and dry.     Coloration: Skin is not jaundiced.     Findings: No rash.  Neurological:     Mental Status: She is alert and oriented to person, place, and time.     Cranial Nerves: No cranial nerve deficit.  Psychiatric:        Mood  and Affect: Mood normal.        Behavior: Behavior normal.        Thought Content: Thought content normal.     LABS:      Latest Ref Rng & Units 11/27/2021   12:00 AM 11/17/2021   12:00 AM 11/11/2021   12:00 AM  CBC  WBC  8.2     4.3     2.1      Hemoglobin 12.0 - 16.0 12.7     13.0     11.9      Hematocrit 36 - 46 39     40     38      Platelets 150 - 400 K/uL 112     146     97         This result is from an external source.      Latest Ref Rng & Units 11/27/2021   12:00 AM 11/11/2021   12:00 AM 10/24/2021   12:00 AM  CMP  BUN 4 - _0 Creatinine 0.5 - 1.1 1.3     1.1     1.3      Sodium 137 - 147 138     142     140      Potassium 3.5 - 5.1 mEq/L 3.9     3.2     4.1      Chloride 99 - 108 104     109     105      CO2 13 - _1 Calcium 8.7 - 10.7 9.1     8.6     9.1      Alkaline Phos 25 - 125 132     125     127      AST 13 - 35 _2 ALT 7 - 35 U/L _3 This result is from an external source.     Lab Results  Component Value Date   CEA1 5.5 (H) 04/10/2021   CEA1 22.8 (H) 07/16/2020   CEA 5.9 (A) 11/27/2021   /  CEA  Date Value Ref Range Status  11/27/2021 5.9 (A) 0 - 4.7 Final  04/10/2021 5.5 (H) 0.0 - 4.7 ng/mL Final    Comment:    (NOTE)  Nonsmokers          <3.9                             Smokers             <5.6 [employer] Electrochemiluminescence Immunoassay (ECLIA) Values obtained with different assay methods or kits cannot be used interchangeably.  Results cannot be interpreted as absolute evidence of the presence or absence of malignant disease. Performed At: [facility] [place], [place] [id number] Rush Farmer MD RX:[id number]    No results found for: "PSA1" No results found for: "CAN199" No results found for: "CAN125"  No results found for: "TOTALPROTELP", "ALBUMINELP", "A1GS", "A2GS", "BETS", "BETA2SER",  "GAMS", "MSPIKE", "SPEI" Lab Results  Component Value Date   TIBC 476 (H) 04/29/2020   FERRITIN 17 04/29/2020   FERRITIN 7.3 (L) 11/21/2019   IRONPCTSAT 15 04/29/2020   IRONPCTSAT 8.5 (L) 11/21/2019   No results found for: "LDH"     Component Value Date/Time   CEA1 5.5 (H) 04/10/2021 1007   CEA 5.9 (A) 11/27/2021 0000    Review Flowsheet  More data exists      Latest Ref Rng & Units 07/16/2020 04/10/2021 11/27/2021  Oncology Labs  CEA 0.0 - 4.7 ng/mL 22.8  5.5  -  CEA ([facility]) 0 - 4.7 - - 5.9        Details       This result is from an external source.          STUDIES:  MR Brain W Wo Contrast  Result Date: 11/07/2021 CLINICAL DATA:  Brain metastases, assess treatment response 3T SRS Protocol EXAM: MRI HEAD WITHOUT AND WITH CONTRAST TECHNIQUE: Multiplanar, multiecho pulse sequences of the brain and surrounding structures were obtained without and with intravenous contrast. CONTRAST:  72m MULTIHANCE GADOBENATE DIMEGLUMINE 529 MG/ML IV SOLN COMPARISON:  08/01/2021 FINDINGS: Brain: Postoperative changes of the left cerebellum. Unchanged small left suboccipital fluid collection. Postoperative enhancement remains present. There is no new suspicious enhancement. Stable 4 mm right cerebellar enhancing lesion (series 11, image 36). No new mass or abnormal enhancement. No acute infarction or hemorrhage. Chronic blood products at the operative site. No hydrocephalus or extra-axial collection. Minimal small foci of T2 hyperintensity in the supratentorial white matter likely reflect nonspecific gliosis/demyelination. Vascular: Major vessel flow voids at the skull base are preserved. Skull and upper cervical spine: Normal marrow signal is preserved. Sinuses/Orbits: Mild mucosal thickening.  Orbits are unremarkable. Other: Sella is unremarkable.  Mastoid air cells are clear. IMPRESSION: Stable postoperative appearance. Stable small right cerebellar lesion. No evidence of new or progressive  metastatic disease. Electronically Signed   By: PMacy MisM.D.   On: 11/07/2021 12:34      Exam(s): [id number]/CT CHEST-ABD-PELV W/IV CM  CLINICAL DATA: Malignant neoplasm of ascending colon. History of  brain tumor and colon resection. History kidney stones. * Tracking  Code: BO *   EXAM:  CT CHEST, ABDOMEN, AND PELVIS WITH CONTRAST  TECHNIQUE:  Multidetector CT imaging of the chest, abdomen and pelvis was  performed following the standard protocol during bolus  administration of intravenous contrast.   RADIATION DOSE REDUCTION: This exam was performed according to the  departmental dose-optimization program which includes automated  exposure control, adjustment of the mA and/or kV according to  patient size and/or use of iterative reconstruction technique.   CONTRAST: 80 cc Isovue 370   COMPARISON: 07/18/2021  FINDINGS:  CT CHEST FINDINGS   Cardiovascular: Right Port-A-Cath tip low SVC. Aortic  atherosclerosis. Tortuous thoracic aorta. Normal heart size, without  pericardial effusion. No central pulmonary embolism, on this  non-dedicated study.   Mediastinum/Nodes: No supraclavicular adenopathy. No mediastinal or  hilar adenopathy.   Lungs/Pleura: No pleural fluid.  Posterior right upper lobe lung nodule measures 2.4 x 1.7 cm on  41/301 versus 2.1 x 1.3 cm on the prior exam.  Lateral right lower lobe pulmonary nodule measures 1.4 x 1.3 cm on  78/301 versus 1.3 x 0.9 cm on the prior exam.  Left apical pulmonary nodule of 5 mm on 26/3 101 measured 2 mm   Hepatobiliary: Mild hepatic steatosis. Lateral segment left liver  lobe and caudate prominence. No focal liver lesion. Normal  gallbladder, without biliary ductal dilatation.   Pancreas: Normal, without mass or ductal dilatation.   Spleen: Normal in size, without focal abnormality.   Adrenals/Urinary Tract: Normal adrenal glands. Normal kidneys,  without hydronephrosis. Normal urinary bladder.    Stomach/Bowel: Normal stomach, without wall thickening. Partial  right hemicolectomy without local recurrence.  Normal small bowel.   Vascular/Lymphatic: Aortic atherosclerosis. The previously described  small bowel mesenteric nodules are no longer identified. No  abdominopelvic adenopathy.   Reproductive: Hysterectomy. No adnexal mass.  Other: No significant free fluid. Mild pelvic floor laxity. No  evidence of omental or peritoneal disease. Ventral pelvic wall  laxity contains nonobstructive small bowel including on 104/2.   Musculoskeletal: No acute osseous abnormality.   IMPRESSION:  1. Mild progression of pulmonary metastasis.  2. Previously described small mesenteric nodules are no longer  identified.  3. Hepatic steatosis with suspicion of mild cirrhosis  4. Aortic Atherosclerosis (ICD10-I70.0).   ASSESSMENT & PLAN:   Assessment/Plan:  A 56 y.o. female with metastatic colon cancer.  She has had a mixed response with slight progression of the lung nodules, as well as increase in her CEA from 5 to 5.9, but resolution of the small mesenteric nodules previously described.  I expect Dr. Bobby Rumpf would recommend resuming FOLFIRI/Avastin every 2 weeks to provide better control over her disease.  I will discuss this with him.   She is mildly orthostatic today, so I will give her IV fluids in addition to filgrastim today.  I encouraged her to use the Imodium more frequently to avoid dehydration due to diarrhea.  I also advised her that she could alternate Compazine and Zofran for the nausea.  Clinically, she is otherwise doing fairly well.  She will proceed with her 31st cycle of FOLFIRI/Avastin next week. I will see her back in 2 weeks prior to her 32nd cycle of FOLFIRI/Avastin, if Dr. Bobby Rumpf is in agreement.  The patient understands all the plans discussed today and is in agreement with them.      Marvia Pickles, PA-C

## 2021-11-27 NOTE — Patient Instructions (Signed)
Denosumab injection What is this medication? DENOSUMAB (den oh sue mab) slows bone breakdown. Prolia is used to treat osteoporosis in women after menopause and in men, and in people who are taking corticosteroids for 6 months or more. Xgeva is used to treat a high calcium level due to cancer and to prevent bone fractures and other bone problems caused by multiple myeloma or cancer bone metastases. Xgeva is also used to treat giant cell tumor of the bone. This medicine may be used for other purposes; ask your health care provider or pharmacist if you have questions. COMMON BRAND NAME(S): Prolia, XGEVA What should I tell my care team before I take this medication? They need to know if you have any of these conditions: dental disease having surgery or tooth extraction infection kidney disease low levels of calcium or Vitamin D in the blood malnutrition on hemodialysis skin conditions or sensitivity thyroid or parathyroid disease an unusual reaction to denosumab, other medicines, foods, dyes, or preservatives pregnant or trying to get pregnant breast-feeding How should I use this medication? This medicine is for injection under the skin. It is given by a health care professional in a hospital or clinic setting. A special MedGuide will be given to you before each treatment. Be sure to read this information carefully each time. For Prolia, talk to your pediatrician regarding the use of this medicine in children. Special care may be needed. For Xgeva, talk to your pediatrician regarding the use of this medicine in children. While this drug may be prescribed for children as young as 13 years for selected conditions, precautions do apply. Overdosage: If you think you have taken too much of this medicine contact a poison control center or emergency room at once. NOTE: This medicine is only for you. Do not share this medicine with others. What if I miss a dose? It is important not to miss your dose.  Call your doctor or health care professional if you are unable to keep an appointment. What may interact with this medication? Do not take this medicine with any of the following medications: other medicines containing denosumab This medicine may also interact with the following medications: medicines that lower your chance of fighting infection steroid medicines like prednisone or cortisone This list may not describe all possible interactions. Give your health care provider a list of all the medicines, herbs, non-prescription drugs, or dietary supplements you use. Also tell them if you smoke, drink alcohol, or use illegal drugs. Some items may interact with your medicine. What should I watch for while using this medication? Visit your doctor or health care professional for regular checks on your progress. Your doctor or health care professional may order blood tests and other tests to see how you are doing. Call your doctor or health care professional for advice if you get a fever, chills or sore throat, or other symptoms of a cold or flu. Do not treat yourself. This drug may decrease your body's ability to fight infection. Try to avoid being around people who are sick. You should make sure you get enough calcium and vitamin D while you are taking this medicine, unless your doctor tells you not to. Discuss the foods you eat and the vitamins you take with your health care professional. See your dentist regularly. Brush and floss your teeth as directed. Before you have any dental work done, tell your dentist you are receiving this medicine. Do not become pregnant while taking this medicine or for 5 months after   stopping it. Talk with your doctor or health care professional about your birth control options while taking this medicine. Women should inform their doctor if they wish to become pregnant or think they might be pregnant. There is a potential for serious side effects to an unborn child. Talk to  your health care professional or pharmacist for more information. What side effects may I notice from receiving this medication? Side effects that you should report to your doctor or health care professional as soon as possible: allergic reactions like skin rash, itching or hives, swelling of the face, lips, or tongue bone pain breathing problems dizziness jaw pain, especially after dental work redness, blistering, peeling of the skin signs and symptoms of infection like fever or chills; cough; sore throat; pain or trouble passing urine signs of low calcium like fast heartbeat, muscle cramps or muscle pain; pain, tingling, numbness in the hands or feet; seizures unusual bleeding or bruising unusually weak or tired Side effects that usually do not require medical attention (report to your doctor or health care professional if they continue or are bothersome): constipation diarrhea headache joint pain loss of appetite muscle pain runny nose tiredness upset stomach This list may not describe all possible side effects. Call your doctor for medical advice about side effects. You may report side effects to FDA at 1-800-FDA-1088. Where should I keep my medication? This medicine is only given in a clinic, doctor's office, or other health care setting and will not be stored at home. NOTE: This sheet is a summary. It may not cover all possible information. If you have questions about this medicine, talk to your doctor, pharmacist, or health care provider.  2023 Elsevier/Gold Standard (2017-09-24 00:00:00)  

## 2021-11-28 ENCOUNTER — Inpatient Hospital Stay: Payer: PRIVATE HEALTH INSURANCE

## 2021-11-28 ENCOUNTER — Encounter: Payer: Self-pay | Admitting: Hematology and Oncology

## 2021-11-28 ENCOUNTER — Encounter: Payer: Self-pay | Admitting: Oncology

## 2021-11-28 ENCOUNTER — Other Ambulatory Visit: Payer: Self-pay | Admitting: Hematology and Oncology

## 2021-11-28 ENCOUNTER — Inpatient Hospital Stay (INDEPENDENT_AMBULATORY_CARE_PROVIDER_SITE_OTHER): Payer: PRIVATE HEALTH INSURANCE | Admitting: Hematology and Oncology

## 2021-11-28 VITALS — BP 122/74 | HR 94 | Temp 98.2°F | Resp 18 | Ht 68.0 in | Wt 174.0 lb

## 2021-11-28 VITALS — BP 110/75 | HR 102 | Temp 98.2°F | Resp 16 | Ht 68.0 in | Wt 172.8 lb

## 2021-11-28 DIAGNOSIS — C189 Malignant neoplasm of colon, unspecified: Secondary | ICD-10-CM | POA: Diagnosis not present

## 2021-11-28 DIAGNOSIS — Z5111 Encounter for antineoplastic chemotherapy: Secondary | ICD-10-CM | POA: Diagnosis not present

## 2021-11-28 DIAGNOSIS — C182 Malignant neoplasm of ascending colon: Secondary | ICD-10-CM

## 2021-11-28 DIAGNOSIS — C787 Secondary malignant neoplasm of liver and intrahepatic bile duct: Secondary | ICD-10-CM | POA: Diagnosis not present

## 2021-11-28 DIAGNOSIS — D702 Other drug-induced agranulocytosis: Secondary | ICD-10-CM

## 2021-11-28 LAB — CBC: MCV: 94 (ref 81–99)

## 2021-11-28 LAB — CEA: CEA: 5.9 — AB (ref 0–4.7)

## 2021-11-28 MED ORDER — SODIUM CHLORIDE 0.9 % IV SOLN: INTRAVENOUS | Status: DC

## 2021-11-28 MED ORDER — FILGRASTIM-SNDZ 480 MCG/0.8ML IJ SOSY
480.0000 ug | PREFILLED_SYRINGE | Freq: Once | INTRAMUSCULAR | Status: AC
  Administered 2021-11-28: 480 ug via SUBCUTANEOUS
  Filled 2021-11-28: qty 0.8

## 2021-11-28 MED ORDER — HEPARIN SOD (PORK) LOCK FLUSH 100 UNIT/ML IV SOLN
500.0000 [IU] | Freq: Once | INTRAVENOUS | Status: AC | PRN
  Administered 2021-11-28: 500 [IU]

## 2021-11-28 MED ORDER — SODIUM CHLORIDE 0.9% FLUSH
10.0000 mL | INTRAVENOUS | Status: DC | PRN
  Administered 2021-11-28: 10 mL

## 2021-11-28 NOTE — Patient Instructions (Signed)

## 2021-12-01 MED FILL — Fluorouracil IV Soln 2.5 GM/50ML (50 MG/ML): INTRAVENOUS | Qty: 17 | Status: AC

## 2021-12-01 MED FILL — Irinotecan HCl Inj 100 MG/5ML (20 MG/ML): INTRAVENOUS | Qty: 14 | Status: AC

## 2021-12-01 MED FILL — Fosaprepitant Dimeglumine For IV Infusion 150 MG (Base Eq): INTRAVENOUS | Qty: 5 | Status: AC

## 2021-12-01 MED FILL — Bevacizumab-bvzr IV Soln 400 MG/16ML (For Infusion): INTRAVENOUS | Qty: 16 | Status: AC

## 2021-12-01 MED FILL — Fluorouracil IV Soln 5 GM/100ML (50 MG/ML): INTRAVENOUS | Qty: 100 | Status: AC

## 2021-12-01 MED FILL — Dexamethasone Sodium Phosphate Inj 100 MG/10ML: INTRAMUSCULAR | Qty: 1 | Status: AC

## 2021-12-03 ENCOUNTER — Ambulatory Visit: Payer: PRIVATE HEALTH INSURANCE

## 2021-12-03 ENCOUNTER — Inpatient Hospital Stay: Payer: 59 | Attending: Oncology

## 2021-12-03 VITALS — BP 130/73 | HR 87 | Temp 97.8°F | Resp 20 | Wt 178.0 lb

## 2021-12-03 DIAGNOSIS — C7971 Secondary malignant neoplasm of right adrenal gland: Secondary | ICD-10-CM | POA: Diagnosis not present

## 2021-12-03 DIAGNOSIS — Z5189 Encounter for other specified aftercare: Secondary | ICD-10-CM | POA: Diagnosis not present

## 2021-12-03 DIAGNOSIS — C786 Secondary malignant neoplasm of retroperitoneum and peritoneum: Secondary | ICD-10-CM | POA: Diagnosis not present

## 2021-12-03 DIAGNOSIS — C7931 Secondary malignant neoplasm of brain: Secondary | ICD-10-CM | POA: Diagnosis not present

## 2021-12-03 DIAGNOSIS — C7802 Secondary malignant neoplasm of left lung: Secondary | ICD-10-CM | POA: Insufficient documentation

## 2021-12-03 DIAGNOSIS — C7801 Secondary malignant neoplasm of right lung: Secondary | ICD-10-CM | POA: Insufficient documentation

## 2021-12-03 DIAGNOSIS — Z5111 Encounter for antineoplastic chemotherapy: Secondary | ICD-10-CM | POA: Insufficient documentation

## 2021-12-03 DIAGNOSIS — C182 Malignant neoplasm of ascending colon: Secondary | ICD-10-CM | POA: Diagnosis not present

## 2021-12-03 DIAGNOSIS — C189 Malignant neoplasm of colon, unspecified: Secondary | ICD-10-CM | POA: Diagnosis present

## 2021-12-03 MED ORDER — PALONOSETRON HCL INJECTION 0.25 MG/5ML
0.2500 mg | Freq: Once | INTRAVENOUS | Status: AC
  Administered 2021-12-03: 0.25 mg via INTRAVENOUS
  Filled 2021-12-03: qty 5

## 2021-12-03 MED ORDER — FLUOROURACIL CHEMO INJECTION 2.5 GM/50ML
400.0000 mg/m2 | Freq: Once | INTRAVENOUS | Status: AC
  Administered 2021-12-03: 850 mg via INTRAVENOUS
  Filled 2021-12-03: qty 17

## 2021-12-03 MED ORDER — SODIUM CHLORIDE 0.9 % IV SOLN: Freq: Once | INTRAVENOUS | Status: AC

## 2021-12-03 MED ORDER — FAMOTIDINE IN NACL 20-0.9 MG/50ML-% IV SOLN
20.0000 mg | Freq: Once | INTRAVENOUS | Status: AC
  Administered 2021-12-03: 20 mg via INTRAVENOUS
  Filled 2021-12-03: qty 50

## 2021-12-03 MED ORDER — SODIUM CHLORIDE 0.9 % IV SOLN
5.0000 mg/kg | Freq: Once | INTRAVENOUS | Status: AC
  Administered 2021-12-03: 400 mg via INTRAVENOUS
  Filled 2021-12-03: qty 16

## 2021-12-03 MED ORDER — SODIUM CHLORIDE 0.9 % IV SOLN
2400.0000 mg/m2 | INTRAVENOUS | Status: DC
  Administered 2021-12-03: 5000 mg via INTRAVENOUS
  Filled 2021-12-03: qty 100

## 2021-12-03 MED ORDER — ATROPINE SULFATE 1 MG/ML IV SOLN
0.5000 mg | Freq: Once | INTRAVENOUS | Status: DC | PRN
  Filled 2021-12-03: qty 1

## 2021-12-03 MED ORDER — SODIUM CHLORIDE 0.9 % IV SOLN
10.0000 mg | Freq: Once | INTRAVENOUS | Status: AC
  Administered 2021-12-03: 10 mg via INTRAVENOUS
  Filled 2021-12-03: qty 10

## 2021-12-03 MED ORDER — SODIUM CHLORIDE 0.9% FLUSH: 10.0000 mL | INTRAVENOUS | Status: DC | PRN

## 2021-12-03 MED ORDER — ALTEPLASE 2 MG IJ SOLR
2.0000 mg | Freq: Once | INTRAMUSCULAR | Status: AC | PRN
  Administered 2021-12-03: 2 mg
  Filled 2021-12-03: qty 2

## 2021-12-03 MED ORDER — DIPHENHYDRAMINE HCL 50 MG/ML IJ SOLN
25.0000 mg | Freq: Once | INTRAMUSCULAR | Status: AC
  Administered 2021-12-03: 25 mg via INTRAVENOUS
  Filled 2021-12-03: qty 1

## 2021-12-03 MED ORDER — IRINOTECAN HCL CHEMO INJECTION 100 MG/5ML
135.0000 mg/m2 | Freq: Once | INTRAVENOUS | Status: AC
  Administered 2021-12-03: 280 mg via INTRAVENOUS
  Filled 2021-12-03: qty 14

## 2021-12-03 MED ORDER — HEPARIN SOD (PORK) LOCK FLUSH 100 UNIT/ML IV SOLN: 500.0000 [IU] | Freq: Once | INTRAVENOUS | Status: DC | PRN

## 2021-12-03 MED ORDER — SODIUM CHLORIDE 0.9 % IV SOLN
150.0000 mg | Freq: Once | INTRAVENOUS | Status: AC
  Administered 2021-12-03: 150 mg via INTRAVENOUS
  Filled 2021-12-03: qty 150

## 2021-12-03 NOTE — Patient Instructions (Signed)
[facility]  Discharge Instructions: Thank you for choosing [facility] to provide your oncology and hematology care.  If you have a lab appointment with the [facility], please go directly to the [facility] and check in at the registration area.   Wear comfortable clothing and clothing appropriate for easy access to any Portacath or PICC line.   We strive to give you quality time with your provider. You may need to reschedule your appointment if you arrive late (15 or more minutes).  Arriving late affects you and other patients whose appointments are after yours.  Also, if you miss three or more appointments without notifying the office, you may be dismissed from the clinic at the provider's discretion.      For prescription refill requests, have your pharmacy contact our office and allow 72 hours for refills to be completed.    Today you received the following chemotherapy and/or immunotherapy agents:Irinotecan, Fluorouracil,   The chemotherapy medication bag should finish at 46 hours For example, if your pump is scheduled for 46 hours and it was put on at 4:00 p.m., it should finish at 2:00 p.m. the day it is scheduled to come off regardless of your appointment time.     Estimated time to finish at 1 pm .   If the display on your pump reads "Low Volume" and it is beeping, take the batteries out of the pump and come to the cancer center for it to be taken off.   If the pump alarms go off prior to the pump reading "Low Volume" then call [phone] and someone can assist you.  If the plunger comes out and the chemotherapy medication is leaking out, please use your home chemo spill kit to clean up the spill. Do NOT use paper towels or other household products.  If you have problems or questions regarding your pump, please call either [phone] (24 hours a day) or the cancer center Monday-Friday 8:00 a.m.- 4:30 p.m. at the clinic number and  we will assist you. If you are unable to get assistance, then go to the nearest Emergency Department and ask the staff to contact the IV team for assistance.        To help prevent nausea and vomiting after your treatment, we encourage you to take your nausea medication as directed.  BELOW ARE SYMPTOMS THAT SHOULD BE REPORTED IMMEDIATELY: *FEVER GREATER THAN 100.4 F (38 C) OR HIGHER *CHILLS OR SWEATING *NAUSEA AND VOMITING THAT IS NOT CONTROLLED WITH YOUR NAUSEA MEDICATION *UNUSUAL SHORTNESS OF BREATH *UNUSUAL BRUISING OR BLEEDING *URINARY PROBLEMS (pain or burning when urinating, or frequent urination) *BOWEL PROBLEMS (unusual diarrhea, constipation, pain near the anus) TENDERNESS IN MOUTH AND THROAT WITH OR WITHOUT PRESENCE OF ULCERS (sore throat, sores in mouth, or a toothache) UNUSUAL RASH, SWELLING OR PAIN  UNUSUAL VAGINAL DISCHARGE OR ITCHING   Items with * indicate a potential emergency and should be followed up as soon as possible or go to the Emergency Department if any problems should occur.  Please show the CHEMOTHERAPY ALERT CARD or IMMUNOTHERAPY ALERT CARD at check-in to the Emergency Department and triage nurse.  Should you have questions after your visit or need to cancel or reschedule your appointment, please contact [facility]  Dept: [phone]  and follow the prompts.  Office hours are 8:00 a.m. to 4:30 p.m. Monday - Friday. Please note that voicemails left after 4:00 p.m. may not be returned  until the following business day.  We are closed weekends and major holidays. You have access to a nurse at all times for urgent questions. Please call the main number to the clinic Dept: [phone] and follow the prompts.  For any non-urgent questions, you may also contact your provider using MyChart. We now offer e-Visits for anyone 76 and older to request care online for non-urgent symptoms. For details visit [email or web address].   Also download the  MyChart app! Go to the app store, search "MyChart", open the app, select [facility], and log in with your MyChart username and password.  Masks are optional in the cancer centers. If you would like for your care team to wear a mask while they are taking care of you, please let them know. For doctor visits, patients may have with them one support person who is at least 54 years old. At this time, visitors are not allowed in the infusion area.

## 2021-12-05 ENCOUNTER — Inpatient Hospital Stay: Payer: 59

## 2021-12-05 VITALS — BP 109/77 | HR 101 | Temp 98.1°F | Resp 18 | Ht 68.0 in | Wt 177.0 lb

## 2021-12-05 DIAGNOSIS — C182 Malignant neoplasm of ascending colon: Secondary | ICD-10-CM | POA: Diagnosis not present

## 2021-12-05 DIAGNOSIS — Z5111 Encounter for antineoplastic chemotherapy: Secondary | ICD-10-CM | POA: Diagnosis not present

## 2021-12-05 MED ORDER — SODIUM CHLORIDE 0.9% FLUSH
10.0000 mL | INTRAVENOUS | Status: DC | PRN
  Administered 2021-12-05: 10 mL

## 2021-12-05 MED ORDER — HEPARIN SOD (PORK) LOCK FLUSH 100 UNIT/ML IV SOLN
500.0000 [IU] | Freq: Once | INTRAVENOUS | Status: AC | PRN
  Administered 2021-12-05: 500 [IU]

## 2021-12-05 NOTE — Patient Instructions (Signed)
Fluorouracil, 5-FU injection What is this medication? FLUOROURACIL, 5-FU (flure oh YOOR a sil) is a chemotherapy drug. It slows the growth of cancer cells. This medicine is used to treat many types of cancer like breast cancer, colon or rectal cancer, pancreatic cancer, and stomach cancer. This medicine may be used for other purposes; ask your health care provider or pharmacist if you have questions. COMMON BRAND NAME(S): Adrucil What should I tell my care team before I take this medication? They need to know if you have any of these conditions: blood disorders dihydropyrimidine dehydrogenase (DPD) deficiency infection (especially a virus infection such as chickenpox, cold sores, or herpes) kidney disease liver disease malnourished, poor nutrition recent or ongoing radiation therapy an unusual or allergic reaction to fluorouracil, other chemotherapy, other medicines, foods, dyes, or preservatives pregnant or trying to get pregnant breast-feeding How should I use this medication? This drug is given as an infusion or injection into a vein. It is administered in a hospital or clinic by a specially trained health care professional. Talk to your pediatrician regarding the use of this medicine in children. Special care may be needed. Overdosage: If you think you have taken too much of this medicine contact a poison control center or emergency room at once. NOTE: This medicine is only for you. Do not share this medicine with others. What if I miss a dose? It is important not to miss your dose. Call your doctor or health care professional if you are unable to keep an appointment. What may interact with this medication? Do not take this medicine with any of the following medications: live virus vaccines This medicine may also interact with the following medications: medicines that treat or prevent blood clots like warfarin, enoxaparin, and dalteparin This list may not describe all possible  interactions. Give your health care provider a list of all the medicines, herbs, non-prescription drugs, or dietary supplements you use. Also tell them if you smoke, drink alcohol, or use illegal drugs. Some items may interact with your medicine. What should I watch for while using this medication? Visit your doctor for checks on your progress. This drug may make you feel generally unwell. This is not uncommon, as chemotherapy can affect healthy cells as well as cancer cells. Report any side effects. Continue your course of treatment even though you feel ill unless your doctor tells you to stop. In some cases, you may be given additional medicines to help with side effects. Follow all directions for their use. Call your doctor or health care professional for advice if you get a fever, chills or sore throat, or other symptoms of a cold or flu. Do not treat yourself. This drug decreases your body's ability to fight infections. Try to avoid being around people who are sick. This medicine may increase your risk to bruise or bleed. Call your doctor or health care professional if you notice any unusual bleeding. Be careful brushing and flossing your teeth or using a toothpick because you may get an infection or bleed more easily. If you have any dental work done, tell your dentist you are receiving this medicine. Avoid taking products that contain aspirin, acetaminophen, ibuprofen, naproxen, or ketoprofen unless instructed by your doctor. These medicines may hide a fever. Do not become pregnant while taking this medicine. Women should inform their doctor if they wish to become pregnant or think they might be pregnant. There is a potential for serious side effects to an unborn child. Talk to your health care   professional or pharmacist for more information. Do not breast-feed an infant while taking this medicine. Men should inform their doctor if they wish to father a child. This medicine may lower sperm  counts. Do not treat diarrhea with over the counter products. Contact your doctor if you have diarrhea that lasts more than 2 days or if it is severe and watery. This medicine can make you more sensitive to the sun. Keep out of the sun. If you cannot avoid being in the sun, wear protective clothing and use sunscreen. Do not use sun lamps or tanning beds/booths. What side effects may I notice from receiving this medication? Side effects that you should report to your doctor or health care professional as soon as possible: allergic reactions like skin rash, itching or hives, swelling of the face, lips, or tongue low blood counts - this medicine may decrease the number of white blood cells, red blood cells and platelets. You may be at increased risk for infections and bleeding. signs of infection - fever or chills, cough, sore throat, pain or difficulty passing urine signs of decreased platelets or bleeding - bruising, pinpoint red spots on the skin, black, tarry stools, blood in the urine signs of decreased red blood cells - unusually weak or tired, fainting spells, lightheadedness breathing problems changes in vision chest pain mouth sores nausea and vomiting pain, swelling, redness at site where injected pain, tingling, numbness in the hands or feet redness, swelling, or sores on hands or feet stomach pain unusual bleeding Side effects that usually do not require medical attention (report to your doctor or health care professional if they continue or are bothersome): changes in finger or toe nails diarrhea dry or itchy skin hair loss headache loss of appetite sensitivity of eyes to the light stomach upset unusually teary eyes This list may not describe all possible side effects. Call your doctor for medical advice about side effects. You may report side effects to FDA at 1-800-FDA-1088. Where should I keep my medication? This drug is given in a hospital or clinic and will not be  stored at home. NOTE: This sheet is a summary. It may not cover all possible information. If you have questions about this medicine, talk to your doctor, pharmacist, or health care provider.  2023 Elsevier/Gold Standard (2021-04-18 00:00:00)  

## 2021-12-08 ENCOUNTER — Other Ambulatory Visit: Payer: PRIVATE HEALTH INSURANCE

## 2021-12-08 ENCOUNTER — Ambulatory Visit: Payer: PRIVATE HEALTH INSURANCE | Admitting: Oncology

## 2021-12-10 ENCOUNTER — Ambulatory Visit: Payer: PRIVATE HEALTH INSURANCE

## 2021-12-10 ENCOUNTER — Other Ambulatory Visit: Payer: Self-pay | Admitting: Psychiatry

## 2021-12-10 DIAGNOSIS — F333 Major depressive disorder, recurrent, severe with psychotic symptoms: Secondary | ICD-10-CM

## 2021-12-11 ENCOUNTER — Ambulatory Visit: Payer: PRIVATE HEALTH INSURANCE

## 2021-12-12 ENCOUNTER — Ambulatory Visit: Payer: PRIVATE HEALTH INSURANCE

## 2021-12-12 ENCOUNTER — Other Ambulatory Visit: Payer: PRIVATE HEALTH INSURANCE

## 2021-12-12 ENCOUNTER — Ambulatory Visit: Payer: PRIVATE HEALTH INSURANCE | Admitting: Oncology

## 2021-12-14 ENCOUNTER — Other Ambulatory Visit: Payer: Self-pay | Admitting: Psychiatry

## 2021-12-14 DIAGNOSIS — F333 Major depressive disorder, recurrent, severe with psychotic symptoms: Secondary | ICD-10-CM

## 2021-12-15 ENCOUNTER — Ambulatory Visit: Payer: PRIVATE HEALTH INSURANCE

## 2021-12-15 ENCOUNTER — Inpatient Hospital Stay: Payer: 59

## 2021-12-15 VITALS — BP 119/86 | HR 91 | Temp 97.6°F | Resp 20 | Wt 173.0 lb

## 2021-12-15 DIAGNOSIS — Z5111 Encounter for antineoplastic chemotherapy: Secondary | ICD-10-CM | POA: Diagnosis not present

## 2021-12-15 DIAGNOSIS — C182 Malignant neoplasm of ascending colon: Secondary | ICD-10-CM

## 2021-12-15 MED ORDER — FILGRASTIM-SNDZ 480 MCG/0.8ML IJ SOSY
480.0000 ug | PREFILLED_SYRINGE | Freq: Once | INTRAMUSCULAR | Status: AC
  Administered 2021-12-15: 480 ug via SUBCUTANEOUS
  Filled 2021-12-15: qty 0.8

## 2021-12-16 ENCOUNTER — Other Ambulatory Visit: Payer: Self-pay | Admitting: Family Medicine

## 2021-12-16 ENCOUNTER — Telehealth: Payer: Self-pay

## 2021-12-16 ENCOUNTER — Inpatient Hospital Stay: Payer: 59

## 2021-12-16 VITALS — BP 121/63 | HR 102 | Temp 98.4°F | Resp 20 | Wt 175.1 lb

## 2021-12-16 DIAGNOSIS — Z1231 Encounter for screening mammogram for malignant neoplasm of breast: Secondary | ICD-10-CM

## 2021-12-16 DIAGNOSIS — C182 Malignant neoplasm of ascending colon: Secondary | ICD-10-CM

## 2021-12-16 DIAGNOSIS — Z5111 Encounter for antineoplastic chemotherapy: Secondary | ICD-10-CM | POA: Diagnosis not present

## 2021-12-16 MED ORDER — FILGRASTIM-SNDZ 480 MCG/0.8ML IJ SOSY
480.0000 ug | PREFILLED_SYRINGE | Freq: Once | INTRAMUSCULAR | Status: AC
  Administered 2021-12-16: 480 ug via SUBCUTANEOUS
  Filled 2021-12-16: qty 0.8

## 2021-12-16 NOTE — Telephone Encounter (Signed)
Called to remind patient of appt. No Answer

## 2021-12-16 NOTE — Patient Instructions (Signed)
Filgrastim, G-CSF injection ?What is this medication? ?FILGRASTIM, G-CSF (fil GRA stim) is a granulocyte colony-stimulating factor that stimulates the growth of neutrophils, a type of white blood cell (WBC) important in the body's fight against infection. It is used to reduce the incidence of fever and infection in patients with certain types of cancer who are receiving chemotherapy that affects the bone marrow, to stimulate blood cell production for removal of WBCs from the body prior to a bone marrow transplantation, to reduce the incidence of fever and infection in patients who have severe chronic neutropenia, and to improve survival outcomes following high-dose radiation exposure that is toxic to the bone marrow. ?This medicine may be used for other purposes; ask your health care provider or pharmacist if you have questions. ?COMMON BRAND NAME(S): Neupogen, Nivestym, Releuko, Zarxio ?What should I tell my care team before I take this medication? ?They need to know if you have any of these conditions: ?kidney disease ?latex allergy ?ongoing radiation therapy ?sickle cell disease ?an unusual or allergic reaction to filgrastim, pegfilgrastim, other medicines, foods, dyes, or preservatives ?pregnant or trying to get pregnant ?breast-feeding ?How should I use this medication? ?This medicine is for injection under the skin or infusion into a vein. As an infusion into a vein, it is usually given by a health care professional in a hospital or clinic setting. If you get this medicine at home, you will be taught how to prepare and give this medicine. Refer to the Instructions for Use that come with your medication packaging. Use exactly as directed. Take your medicine at regular intervals. Do not take your medicine more often than directed. ?It is important that you put your used needles and syringes in a special sharps container. Do not put them in a trash can. If you do not have a sharps container, call your pharmacist  or healthcare provider to get one. ?Talk to your pediatrician regarding the use of this medicine in children. While this drug may be prescribed for children as young as 7 months for selected conditions, precautions do apply. ?Overdosage: If you think you have taken too much of this medicine contact a poison control center or emergency room at once. ?NOTE: This medicine is only for you. Do not share this medicine with others. ?What if I miss a dose? ?It is important not to miss your dose. Call your doctor or health care professional if you miss a dose. ?What may interact with this medication? ?This medicine may interact with the following medications: ?medicines that may cause a release of neutrophils, such as lithium ?This list may not describe all possible interactions. Give your health care provider a list of all the medicines, herbs, non-prescription drugs, or dietary supplements you use. Also tell them if you smoke, drink alcohol, or use illegal drugs. Some items may interact with your medicine. ?What should I watch for while using this medication? ?Your condition will be monitored carefully while you are receiving this medicine. ?You may need blood work done while you are taking this medicine. ?Talk to your health care provider about your risk of cancer. You may be more at risk for certain types of cancer if you take this medicine. ?What side effects may I notice from receiving this medication? ?Side effects that you should report to your doctor or health care professional as soon as possible: ?allergic reactions like skin rash, itching or hives, swelling of the face, lips, or tongue ?back pain ?dizziness or feeling faint ?fever ?pain, redness, or   irritation at site where injected ?pinpoint red spots on the skin ?shortness of breath or breathing problems ?signs and symptoms of kidney injury like trouble passing urine, change in the amount of urine, or red or dark-brown urine ?stomach or side pain, or pain at  the shoulder ?swelling ?tiredness ?unusual bleeding or bruising ?Side effects that usually do not require medical attention (report to your doctor or health care professional if they continue or are bothersome): ?bone pain ?cough ?diarrhea ?hair loss ?headache ?muscle pain ?This list may not describe all possible side effects. Call your doctor for medical advice about side effects. You may report side effects to FDA at 1-800-FDA-1088. ?Where should I keep my medication? ?Keep out of the reach of children. ?Store in a refrigerator between 2 and 8 degrees C (36 and 46 degrees F). Do not freeze. Keep in carton to protect from light. Throw away this medicine if vials or syringes are left out of the refrigerator for more than 24 hours. Throw away any unused medicine after the expiration date. ?NOTE: This sheet is a summary. It may not cover all possible information. If you have questions about this medicine, talk to your doctor, pharmacist, or health care provider. ?? 2023 Elsevier/Gold Standard (2021-04-18 00:00:00) ? ?

## 2021-12-17 ENCOUNTER — Inpatient Hospital Stay: Payer: 59

## 2021-12-17 ENCOUNTER — Ambulatory Visit: Payer: PRIVATE HEALTH INSURANCE

## 2021-12-17 ENCOUNTER — Other Ambulatory Visit: Payer: Self-pay | Admitting: Pharmacist

## 2021-12-17 VITALS — BP 113/71 | HR 92 | Temp 98.4°F | Resp 20

## 2021-12-17 DIAGNOSIS — D702 Other drug-induced agranulocytosis: Secondary | ICD-10-CM

## 2021-12-17 DIAGNOSIS — Z5111 Encounter for antineoplastic chemotherapy: Secondary | ICD-10-CM | POA: Diagnosis not present

## 2021-12-17 DIAGNOSIS — C182 Malignant neoplasm of ascending colon: Secondary | ICD-10-CM

## 2021-12-17 DIAGNOSIS — C189 Malignant neoplasm of colon, unspecified: Secondary | ICD-10-CM

## 2021-12-17 MED ORDER — SODIUM CHLORIDE 0.9 % IV SOLN: Freq: Once | INTRAVENOUS | Status: AC

## 2021-12-17 MED ORDER — HEPARIN SOD (PORK) LOCK FLUSH 100 UNIT/ML IV SOLN
500.0000 [IU] | Freq: Once | INTRAVENOUS | Status: AC | PRN
  Administered 2021-12-17: 500 [IU]

## 2021-12-17 MED ORDER — SODIUM CHLORIDE 0.9% FLUSH
10.0000 mL | INTRAVENOUS | Status: DC | PRN
  Administered 2021-12-17: 10 mL

## 2021-12-17 MED ORDER — FILGRASTIM-SNDZ 480 MCG/0.8ML IJ SOSY
480.0000 ug | PREFILLED_SYRINGE | Freq: Once | INTRAMUSCULAR | Status: AC
  Administered 2021-12-17: 480 ug via SUBCUTANEOUS
  Filled 2021-12-17: qty 0.8

## 2021-12-17 NOTE — Progress Notes (Signed)
Patient states that she feels "week and wobby" No acute distress noted- Shannon [name] Phy aware.

## 2021-12-17 NOTE — Patient Instructions (Signed)
Filgrastim, G-CSF injection ?What is this medication? ?FILGRASTIM, G-CSF (fil GRA stim) is a granulocyte colony-stimulating factor that stimulates the growth of neutrophils, a type of white blood cell (WBC) important in the body's fight against infection. It is used to reduce the incidence of fever and infection in patients with certain types of cancer who are receiving chemotherapy that affects the bone marrow, to stimulate blood cell production for removal of WBCs from the body prior to a bone marrow transplantation, to reduce the incidence of fever and infection in patients who have severe chronic neutropenia, and to improve survival outcomes following high-dose radiation exposure that is toxic to the bone marrow. ?This medicine may be used for other purposes; ask your health care provider or pharmacist if you have questions. ?COMMON BRAND NAME(S): Neupogen, Nivestym, Releuko, Zarxio ?What should I tell my care team before I take this medication? ?They need to know if you have any of these conditions: ?kidney disease ?latex allergy ?ongoing radiation therapy ?sickle cell disease ?an unusual or allergic reaction to filgrastim, pegfilgrastim, other medicines, foods, dyes, or preservatives ?pregnant or trying to get pregnant ?breast-feeding ?How should I use this medication? ?This medicine is for injection under the skin or infusion into a vein. As an infusion into a vein, it is usually given by a health care professional in a hospital or clinic setting. If you get this medicine at home, you will be taught how to prepare and give this medicine. Refer to the Instructions for Use that come with your medication packaging. Use exactly as directed. Take your medicine at regular intervals. Do not take your medicine more often than directed. ?It is important that you put your used needles and syringes in a special sharps container. Do not put them in a trash can. If you do not have a sharps container, call your pharmacist  or healthcare provider to get one. ?Talk to your pediatrician regarding the use of this medicine in children. While this drug may be prescribed for children as young as 7 months for selected conditions, precautions do apply. ?Overdosage: If you think you have taken too much of this medicine contact a poison control center or emergency room at once. ?NOTE: This medicine is only for you. Do not share this medicine with others. ?What if I miss a dose? ?It is important not to miss your dose. Call your doctor or health care professional if you miss a dose. ?What may interact with this medication? ?This medicine may interact with the following medications: ?medicines that may cause a release of neutrophils, such as lithium ?This list may not describe all possible interactions. Give your health care provider a list of all the medicines, herbs, non-prescription drugs, or dietary supplements you use. Also tell them if you smoke, drink alcohol, or use illegal drugs. Some items may interact with your medicine. ?What should I watch for while using this medication? ?Your condition will be monitored carefully while you are receiving this medicine. ?You may need blood work done while you are taking this medicine. ?Talk to your health care provider about your risk of cancer. You may be more at risk for certain types of cancer if you take this medicine. ?What side effects may I notice from receiving this medication? ?Side effects that you should report to your doctor or health care professional as soon as possible: ?allergic reactions like skin rash, itching or hives, swelling of the face, lips, or tongue ?back pain ?dizziness or feeling faint ?fever ?pain, redness, or   irritation at site where injected ?pinpoint red spots on the skin ?shortness of breath or breathing problems ?signs and symptoms of kidney injury like trouble passing urine, change in the amount of urine, or red or dark-brown urine ?stomach or side pain, or pain at  the shoulder ?swelling ?tiredness ?unusual bleeding or bruising ?Side effects that usually do not require medical attention (report to your doctor or health care professional if they continue or are bothersome): ?bone pain ?cough ?diarrhea ?hair loss ?headache ?muscle pain ?This list may not describe all possible side effects. Call your doctor for medical advice about side effects. You may report side effects to FDA at 1-800-FDA-1088. ?Where should I keep my medication? ?Keep out of the reach of children. ?Store in a refrigerator between 2 and 8 degrees C (36 and 46 degrees F). Do not freeze. Keep in carton to protect from light. Throw away this medicine if vials or syringes are left out of the refrigerator for more than 24 hours. Throw away any unused medicine after the expiration date. ?NOTE: This sheet is a summary. It may not cover all possible information. If you have questions about this medicine, talk to your doctor, pharmacist, or health care provider. ?? 2023 Elsevier/Gold Standard (2021-04-18 00:00:00) ? ?

## 2021-12-18 ENCOUNTER — Ambulatory Visit: Payer: PRIVATE HEALTH INSURANCE

## 2021-12-19 ENCOUNTER — Ambulatory Visit: Payer: PRIVATE HEALTH INSURANCE

## 2021-12-21 NOTE — Progress Notes (Signed)
[facility]  [address] [place],  [place]  [address] [phone]  Clinic Day:  12/22/2021  Referring physician: Greig Right, MD   HISTORY OF PRESENT ILLNESS:  The patient is a 56 y.o. female with  metastatic colon cancer, which includes spread of disease to her abdominal cavity, lungs, brain and right adrenal gland. She comes in today prior to her 32nd cycle of palliative FOLFIRI/Avastin chemotherapy.  Recent CT scans showed a mixed response to therapy.  Some of her lung nodules had increased in size by millimeters.  However, previous peritoneal nodules had dissipated.  The patient claims to have tolerated her 31st cycle of treatment fairly well.  She continues to report intermittent diarrhea, but is using Imodium as needed.  She denies her GI symptoms getting worse to where she is concerned about disease progression.   With respect to her colon cancer history, she is status post a right hemicolectomy in early October 2018, followed by 12 cycles of adjuvant FOLFOX chemotherapy, which were completed in April 2019.  In December 2021, she underwent a left cerebellar metastasectomy, whose pathology was consistent with metastatic colon cancer.  Tumor testing did come back MMR normal.  CT scans revealed evidence of her cancer being in multiple locations, for which she has been on FOLFIRI/Avastin.  PHYSICAL EXAM:  Blood pressure 113/76, pulse (!) 114, temperature 98.8 F (37.1 C), resp. rate 16, height 5' 8"  (1.727 m), weight 172 lb 8 oz (78.2 kg), last menstrual period 02/22/2019, SpO2 96 %.  Body mass index is 26.23 kg/m.  Performance status (ECOG): 1 - Symptomatic but completely ambulatory  Physical Exam Vitals and nursing note reviewed.  Constitutional:      General: She is not in acute distress.    Appearance: Normal appearance.  HENT:     Head: Normocephalic and atraumatic.     Mouth/Throat:     Mouth: Mucous membranes are moist.     Pharynx:  Oropharynx is clear. No oropharyngeal exudate or posterior oropharyngeal erythema.  Eyes:     General: No scleral icterus.    Extraocular Movements: Extraocular movements intact.     Conjunctiva/sclera: Conjunctivae normal.     Pupils: Pupils are equal, round, and reactive to light.  Cardiovascular:     Rate and Rhythm: Normal rate and regular rhythm.     Heart sounds: Normal heart sounds. No murmur heard.    No friction rub. No gallop.  Pulmonary:     Effort: Pulmonary effort is normal.     Breath sounds: Normal breath sounds. No wheezing, rhonchi or rales.  Abdominal:     General: There is no distension.     Palpations: Abdomen is soft. There is no hepatomegaly, splenomegaly or mass.     Tenderness: There is no abdominal tenderness.  Musculoskeletal:        General: Normal range of motion.     Cervical back: Normal range of motion and neck supple. No tenderness.     Right lower leg: No edema.     Left lower leg: No edema.  Lymphadenopathy:     Cervical: No cervical adenopathy.     Upper Body:     Right upper body: No supraclavicular or axillary adenopathy.     Left upper body: No supraclavicular or axillary adenopathy.     Lower Body: No right inguinal adenopathy. No left inguinal adenopathy.  Skin:    General: Skin is warm and dry.     Coloration: Skin is  not jaundiced.     Findings: No rash.  Neurological:     Mental Status: She is alert and oriented to person, place, and time.     Cranial Nerves: No cranial nerve deficit.  Psychiatric:        Mood and Affect: Mood normal.        Behavior: Behavior normal.        Thought Content: Thought content normal.     LABS:      Latest Ref Rng & Units 12/22/2021   12:00 AM 11/27/2021   12:00 AM 11/17/2021   12:00 AM  CBC  WBC  6.1     8.2     4.3      Hemoglobin 12.0 - 16.0 14.3     12.7     13.0      Hematocrit 36 - 46 44     39     40      Platelets 150 - 400 K/uL 97     112     146         This result is from an  external source.      Latest Ref Rng & Units 12/22/2021   12:00 AM 11/27/2021   12:00 AM 11/11/2021   12:00 AM  CMP  BUN 4 - 21 14     17     8       Creatinine 0.5 - 1.1 1.2     1.3     1.1      Sodium 137 - 147 136     138     142      Potassium 3.5 - 5.1 mEq/L 4.4     3.9     3.2      Chloride 99 - 108 102     104     109      CO2 13 - 22 23     24     21       Calcium 8.7 - 10.7 9.5     9.1     8.6      Alkaline Phos 25 - 125 155     132     125      AST 13 - 35 28     23     21       ALT 7 - 35 U/L 25     22     18          This result is from an external source.    STUDIES:  No results found.    Exam(s): [id number] CT/CT CHEST-ABD-PELV W/IV CM  CLINICAL DATA: Malignant neoplasm of ascending colon. History of  brain tumor and colon resection. History kidney stones. * Tracking  Code: BO *   EXAM:  CT CHEST, ABDOMEN, AND PELVIS WITH CONTRAST  TECHNIQUE:  Multidetector CT imaging of the chest, abdomen and pelvis was  performed following the standard protocol during bolus  administration of intravenous contrast.   RADIATION DOSE REDUCTION: This exam was performed according to the  departmental dose-optimization program which includes automated  exposure control, adjustment of the mA and/or kV according to  patient size and/or use of iterative reconstruction technique.   CONTRAST: 80 cc Isovue 370   COMPARISON: 07/18/2021   FINDINGS:  CT CHEST FINDINGS   Cardiovascular: Right Port-A-Cath tip low SVC. Aortic  atherosclerosis. Tortuous thoracic aorta. Normal heart size, without  pericardial effusion. No central pulmonary embolism, on this  non-dedicated study.   Mediastinum/Nodes: No supraclavicular adenopathy. No mediastinal or  hilar adenopathy.   Lungs/Pleura: No pleural fluid.  Posterior right upper lobe lung nodule measures 2.4 x 1.7 cm on  41/301 versus 2.1 x 1.3 cm on the prior exam.  Lateral right lower lobe pulmonary nodule measures 1.4 x 1.3 cm on  78/301  versus 1.3 x 0.9 cm on the prior exam.  Left apical pulmonary nodule of 5 mm on 26/3 101 measured 2 mm   Hepatobiliary: Mild hepatic steatosis. Lateral segment left liver  lobe and caudate prominence. No focal liver lesion. Normal  gallbladder, without biliary ductal dilatation.   Pancreas: Normal, without mass or ductal dilatation.   Spleen: Normal in size, without focal abnormality.   Adrenals/Urinary Tract: Normal adrenal glands. Normal kidneys,  without hydronephrosis. Normal urinary bladder.   Stomach/Bowel: Normal stomach, without wall thickening. Partial  right hemicolectomy without local recurrence.  Normal small bowel.   Vascular/Lymphatic: Aortic atherosclerosis. The previously described  small bowel mesenteric nodules are no longer identified. No  abdominopelvic adenopathy.   Reproductive: Hysterectomy. No adnexal mass.  Other: No significant free fluid. Mild pelvic floor laxity. No  evidence of omental or peritoneal disease. Ventral pelvic wall  laxity contains nonobstructive small bowel including on 104/2.   Musculoskeletal: No acute osseous abnormality.   IMPRESSION:  1. Mild progression of pulmonary metastasis.  2. Previously described small mesenteric nodules are no longer  identified.  3. Hepatic steatosis with suspicion of mild cirrhosis  4. Aortic Atherosclerosis (ICD10-I70.0).   ASSESSMENT & PLAN:  Assessment/Plan:  A 56 y.o. female with metastatic colon cancer.  In clinic today, I went over her CT scan images with her, for which she can see that her pulmonary nodules really had not changed in size to bill it was signs of disease progression.  The millimeter changes and the measurements are minuscule at best; furthermore, her peritoneal nodules are no longer visible.  Based upon this, I see no need to change either her chemotherapy regimen or his frequency.  She will proceed with her 32nd cycle of FOLFIRI/Avastin this week, and continue to receive this  chemotherapy regimen once every 3 weeks.  Clinically, she appears to be doing well.  I will see her back in 3 weeks before she gets into her 33rd cycle of FOLFIRI/Avastin.  The patient understands all the plans discussed today and is in agreement with them.      Neziah Braley Macarthur Critchley, MD

## 2021-12-22 ENCOUNTER — Encounter: Payer: Self-pay | Admitting: Psychiatry

## 2021-12-22 ENCOUNTER — Ambulatory Visit (INDEPENDENT_AMBULATORY_CARE_PROVIDER_SITE_OTHER): Payer: 59 | Admitting: Psychiatry

## 2021-12-22 ENCOUNTER — Inpatient Hospital Stay: Payer: 59

## 2021-12-22 ENCOUNTER — Inpatient Hospital Stay (HOSPITAL_BASED_OUTPATIENT_CLINIC_OR_DEPARTMENT_OTHER): Payer: 59 | Admitting: Oncology

## 2021-12-22 ENCOUNTER — Other Ambulatory Visit: Payer: Self-pay

## 2021-12-22 ENCOUNTER — Encounter: Payer: Self-pay | Admitting: Oncology

## 2021-12-22 VITALS — BP 113/76 | HR 114 | Temp 98.8°F | Resp 16 | Ht 68.0 in | Wt 172.5 lb

## 2021-12-22 DIAGNOSIS — F333 Major depressive disorder, recurrent, severe with psychotic symptoms: Secondary | ICD-10-CM

## 2021-12-22 DIAGNOSIS — F422 Mixed obsessional thoughts and acts: Secondary | ICD-10-CM

## 2021-12-22 DIAGNOSIS — G3184 Mild cognitive impairment, so stated: Secondary | ICD-10-CM | POA: Diagnosis not present

## 2021-12-22 DIAGNOSIS — G4721 Circadian rhythm sleep disorder, delayed sleep phase type: Secondary | ICD-10-CM | POA: Diagnosis not present

## 2021-12-22 DIAGNOSIS — C182 Malignant neoplasm of ascending colon: Secondary | ICD-10-CM | POA: Diagnosis not present

## 2021-12-22 DIAGNOSIS — R69 Illness, unspecified: Secondary | ICD-10-CM | POA: Diagnosis not present

## 2021-12-22 DIAGNOSIS — C189 Malignant neoplasm of colon, unspecified: Secondary | ICD-10-CM

## 2021-12-22 DIAGNOSIS — R530 Neoplastic (malignant) related fatigue: Secondary | ICD-10-CM | POA: Diagnosis not present

## 2021-12-22 LAB — BASIC METABOLIC PANEL
BUN: 14 (ref 4–21)
CO2: 23 — AB (ref 13–22)
Chloride: 102 (ref 99–108)
Creatinine: 1.2 — AB (ref 0.5–1.1)
Glucose: 155
Potassium: 4.4 mEq/L (ref 3.5–5.1)
Sodium: 136 — AB (ref 137–147)

## 2021-12-22 LAB — CBC AND DIFFERENTIAL
HCT: 44 (ref 36–46)
Hemoglobin: 14.3 (ref 12.0–16.0)
Neutrophils Absolute: 3.54
Platelets: 97 10*3/uL — AB (ref 150–400)
WBC: 6.1

## 2021-12-22 LAB — HEPATIC FUNCTION PANEL
ALT: 25 U/L (ref 7–35)
AST: 28 (ref 13–35)
Alkaline Phosphatase: 155 — AB (ref 25–125)
Bilirubin, Total: 0.5

## 2021-12-22 LAB — COMPREHENSIVE METABOLIC PANEL
Albumin: 4.3 (ref 3.5–5.0)
Calcium: 9.5 (ref 8.7–10.7)

## 2021-12-22 LAB — CBC: RBC: 4.6 (ref 3.87–5.11)

## 2021-12-22 MED ORDER — MODAFINIL 200 MG PO TABS: ORAL_TABLET | ORAL | 3 refills | Status: DC

## 2021-12-22 MED ORDER — BUPROPION HCL ER (XL) 150 MG PO TB24: 450.0000 mg | ORAL_TABLET | Freq: Every morning | ORAL | 1 refills | Status: DC

## 2021-12-22 MED ORDER — QUETIAPINE FUMARATE 25 MG PO TABS: ORAL_TABLET | ORAL | 1 refills | Status: DC

## 2021-12-22 NOTE — Progress Notes (Signed)
Shannon [name] [id number] [birth year] 56 y.o.    Subjective:   Patient ID:  Shannon [name] is a 56 y.o. (DOB 22-Oct-1965) female.  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  seen November 2020 .  No meds were changed at that time.  10/16/19 the following is noted at this appt: Asked questions about sleep supplement.   Just started GI doctor.   Good overall. No mood swings. No depressive episodes.  Cry more with hormonal changes more easily.   Probably sleep too much to avoid.  Occ stress from work interferes with sleep.  Not as productive from home.  Hopes to get back to the office.  Still working from home.  Boss noticed her forgetfulness with deadlines and missing issues in emails.  Used to be sharper. Wonders if related to meds.  Can usually nap if desired for years and not seasonal.  Only anxiety is Covid related. More poor self care.   Hysterectomy December 2020. No med changes  03/18/2020 appointment with the following noted: July had Covid and both parents and her father died from [place]. Able to help mom with arrangements. Required one iron infusion this year. Never took NAC bc it was too hard to take. Sleep, eating schedule is irregular. More productive at home helps her feel better. Plan: No med changes. 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 For a month had dizzy, falls, NV all of which cleared quickly after removal.  Radiation to brain after this. More CA in lungs and abdomen and having chemo.  Oncologist said she has about 5 years of lifespan left.  MRI and CT pending. Gotten into therapy starting  [name] next week.   Pray a lot and good connection with friends.  Good support.  Trying to do things for fun.   Is working but finding it difficult mentally and physically DT SE fatigue from chemo. Considering LT disability but $ concerns.   If anxious trouble falling asleep.   Works from home.   D 54 yo.   Plan: No med changes except added modafinil  11/26/2020 appointment with the following noted: Added modafinil and didn't notice much from it. Sleep average about 7 hours.  Would prefer 10 hours and may nap here and there in week of chemo even on chemo.  Had to get it at [facility] with [facility]. No dx of OSA. Last 2 weeks a little low emotionally and mentally after up for a couple of weeks with company and it wore her out. Sleepiness is normally worse than tiredness. Only situational mood effects. Plan: No benefit 200 mg modafinil nor SE therefore Increase Modafinil to 300 mg daily for excessive sleepiness  04/21/2021 appointment with the following noted: Still dealing with chemotx for colon cancer Thinks increase modafinil questionable but misses it more than takes it. No SE. Things are getting harder rough month with more depression and anxiety related to cancer and daughter.  Work is hard.  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  Missing deadlines.   Needs to call social security. D 54 yo and in therapy with [name].  So angry with patient. Has been able to stay on branded Seroquel. Tumors on CT scan  are shrinking recently. Plan: HX RELAPSE PSYCHOSIS WITH GENERIC QUETIAPINE, Continue branded Seroquel 800 mg HS and lamotrigine 150 mg daily and Luvox 300 mg daily Vraylar 1.5 mg daily for 2 weeks, Then if tolerated increase to 3 mg daily. If mood improves then reduce Seroquel to 1 and 1/2 tablets.  06/06/2021 appt noted: I like it.  More clear headed and better energy.  Missed a few bc out of samples. Reduced Seroquel 600 mg HS Less depressed.   Especially right after starting and others noticed the benefit. Plan : Continue lamotrigine 150 mg daily and Luvox 300 mg daily Vraylar 3 mg daily. Reduce Seroquel to 400 mg nightly for 2 weeks,  Then reduce Seroquel to 200 mg nightly for 2 weeks,  Then reduce to 100 mg for 2 weeks, Then reduce to 50 mg 2 weeks then stop if you can still sleep ok  08/07/2021 appointment with the following noted: Continues Vraylar 3 mg and Luvox 300 mg daily.  Reduced Seroquel to 200 mg nightly and takes Ambien 5 mg nightly Went slowly down with Seroquel. Doing pretty good.  Some night hard to go to sleep but usally ok.   Still please with Vraylar less sedated. No mood swings.  Some struggle with depression with reduced motivation and enjoyment.  Sometimes bored and would rather go to bed.  Don't want to read or watch TV.   Can follow a show but doesn't find it intersting.  CT scan show tumors shrinking. Plan: Continue lamotrigine 150 mg daily and Luvox 300 mg daily Reduce Vraylar 3 mg every other day to see If depression energy and interest and enjoyment is better Reduce Seroquel to 150 for 2 weeks Then reduce to 100 mg for 2 weeks, Then reduce to 50 mg 2 weeks then stop if you can still sleep ok Wants to have access to Ambien Plan: No benefit 200 mg modafinil nor SE therefore OK continue trial  Modafinil to 300 mg daily for excessive sleepiness  10/30/2021 appointment with the following noted: Reduced quetiapine to 100 mg and trouble sleeping so back to 150 mg HS Fine with reduced Vraylar to 3 mg QOD Increase modafinil to 300 mg and is more alert and more active. No SE Overall mood is ok but still likes to resto often but not sleeping as much.  Not sure if it is chemo related.  Will rest after activity. At night sleeps 10 hours. Still hard to enjoy things and part of reason will lay down bc is bored.   Changed TV cable and not watching much now.  Can't concentrate to read well.  Enjoys dog.  Enjoying some  projects at home.  Patient denies difficulty with sleep maintenance.   Denies appetite disturbance.  Patient reports that motivation have been good.  Patient denies any suicidal ideation.  Still too monotone. This is last week at work.  Not doing well at work DT concentration.  Will apply for disability. Ativan only every few weeks. Plan: Continue lamotrigine 150 mg daily  Continue Vraylar 3 mg every other day  Try again to Reduce Seroquel to 100 mg for 2 weeks, Then reduce to 50 mg 2 weeks then stop if you can still sleep ok Wants to have access to Ambien Reduce Luvox to 2 and 1/2 tablets Start Wellbutrin in the AM 1 for 1 week then 2 or 300 mg AM  12/22/21  appt noted: Reduced quetiapine to 50 and was OK but when stopped couldn't sleep.  Less daytime drowsy with less.   Reduced fluvoxamine to 250 and started Wellbutrin XL 300 mg AM. More even keel level and less down.  More content.  More steady.  Less teary and emotional. No SE problems with change. ? Modafinil in hopes of better concentration and clarity but ? Effect. Has some trouble with conc but it varies. No increase anxiety or jitteriness.   Past Psychiatric Medication Trials: HX RELAPSE PSYCHOSIS WITH GENERIC QUETIAPINE,  Seroquel 800, Risperdal 2, inVega 3, Abilify 15, Vraylar 3 Citalopram, fluoxetine 80, sertraline to 50,  lamotrigine 200,  Modafinil 300 Has been under the care of the psychiatrist since March 2000  D seeing [name]  Review of Systems:  Review of Systems  Constitutional:  Positive for fatigue.  HENT:  Negative for rhinorrhea.   Eyes:  Negative for discharge.  Cardiovascular:  Negative for palpitations.  Gastrointestinal:  Positive for constipation. Negative for abdominal distention.  Neurological:  Positive for weakness. Negative for tremors.       No history of SZ  Psychiatric/Behavioral:  Positive for dysphoric mood.     Medications: I have reviewed the patient's current  medications.  Current Outpatient Medications  Medication Sig Dispense Refill   acetaminophen (TYLENOL) 325 MG tablet Take 2 tablets (650 mg total) by mouth every 6 (six) hours as needed for mild pain.     atorvastatin (LIPITOR) 10 MG tablet Take by mouth.     CONTOUR NEXT TEST test strip 3 (three) times daily.     Dulaglutide (TRULICITY) 1.5 AJ/2.8NO SOPN Inject into the skin.     fluvoxaMINE (LUVOX) 100 MG tablet TAKE 3 TABLETS (300 MG TOTAL) BY MOUTH AT BEDTIME (Patient taking differently: Take 250 mg by mouth at bedtime.) 270 tablet 0   HUMALOG KWIKPEN 100 UNIT/ML KwikPen Inject into the skin.     hyoscyamine (LEVSIN SL) 0.125 MG SL tablet Place 1 tablet (0.125 mg total) under the tongue every 6 (six) hours as needed. 45 tablet 1   insulin aspart (NOVOLOG FLEXPEN) 100 UNIT/ML FlexPen Inject 8 Units into the skin 3 (three) times daily with meals. 15 mL 11   insulin glargine (LANTUS) 100 UNIT/ML Solostar Pen Inject 26 Units into the skin daily. 15 mL 11   lamoTRIgine (LAMICTAL) 150 MG tablet Take 1 tablet (150 mg total) by mouth daily. 90 tablet 3   levothyroxine (SYNTHROID) 75 MCG tablet Take 1 tablet (75 mcg total) by mouth daily. 30 tablet 0   loratadine (CLARITIN) 10 MG tablet Take 10 mg by mouth daily.     LORazepam (ATIVAN) 0.5 MG tablet Take 0.5 mg by mouth every 8 (eight) hours as needed for anxiety.     metFORMIN (GLUCOPHAGE) 500 MG tablet Take 1 tablet (500 mg total) by mouth 2 (two) times daily with a meal. 60 tablet 0   Multiple Vitamin (MULTI-VITAMIN) tablet Take 1 tablet by mouth daily.     NOVOFINE PEN NEEDLE 32G X 6 MM MISC SMARTSIG:Syringe(s) SUB-Q     Omega-3 1000 MG CAPS Take by mouth.     ondansetron (ZOFRAN) 4 MG tablet Take 1 tablet (4 mg total) by mouth every 4 (four) hours as needed for nausea. 90 tablet 3   pantoprazole (PROTONIX) 40 MG tablet TAKE 1 TABLET BY MOUTH EVERYDAY AT BEDTIME 30 tablet 0   potassium chloride SA (KLOR-CON M) 20 MEQ tablet Take 1 tablet (20  mEq total)  by mouth daily. 5 tablet 0   prochlorperazine (COMPAZINE) 10 MG tablet TAKE 1 TABLET BY MOUTH EVERY 6 HOURS AS NEEDED FOR NAUSEA OR VOMITING. 90 tablet 3   senna-docusate (SENOKOT-S) 8.6-50 MG tablet Take 2 tablets by mouth at bedtime.     VRAYLAR 3 MG capsule TAKE 1 CAPSULE BY MOUTH DAILY. (Patient taking differently: Every other day) 30 capsule 1   zolpidem (AMBIEN) 5 MG tablet Take 1 tablet (5 mg total) by mouth at bedtime as needed for sleep. 30 tablet 0   buPROPion (WELLBUTRIN XL) 150 MG 24 hr tablet Take 3 tablets (450 mg total) by mouth every morning. 90 tablet 1   modafinil (PROVIGIL) 200 MG tablet TAKE 1 & 1/2 (ONE & ONE-HALF) TABLETS BY MOUTH ONCE DAILY 45 tablet 3   QUEtiapine (SEROQUEL) 25 MG tablet 1-2 tablets at night 60 tablet 1   No current facility-administered medications for this visit.    Medication Side Effects: None  Allergies:  Allergies  Allergen Reactions   Leucovorin Hives, Rash and Other (See Comments)    Redness on face and neck.  Confirmed that it was the leucovorin causing this on 11/19/21.  We are deleting this from future plan.   Clindamycin/Lincomycin Rash   Irinotecan Rash   Penicillins Rash    Reaction: 10 years    Past Medical History:  Diagnosis Date   Anemia    Iron De   Anxiety    Blood clot in vein    Bowel obstruction ([place])    Colon cancer ([place]) 2018   treated with surgery and chemotherapy   Depression    Diabetes mellitus without complication ([place])    Type II   DVT (deep venous thrombosis) ([place]) 2019   behind right knee- while she wasa on chemo   GERD (gastroesophageal reflux disease)    History of blood transfusion    History of chemotherapy    History of kidney stones 2012   passed   Hypothyroidism    Obesity     Family History  Problem Relation Age of Onset   Irritable bowel syndrome Mother    Colon polyps Father    Breast cancer Maternal Grandmother    Diabetes Maternal Grandmother    Diabetes Maternal  Grandfather    Breast cancer Paternal Grandmother    Colon polyps Maternal Uncle    Stomach cancer Neg Hx    Pancreatic cancer Neg Hx    Esophageal cancer Neg Hx    Colon cancer Neg Hx     Social History   Socioeconomic History   Marital status: Widowed    Spouse name: Not on file   Number of children: Not on file   Years of education: Not on file   Highest education level: Not on file  Occupational History   Occupation: care coordinator   Tobacco Use   Smoking status: Never   Smokeless tobacco: Never  Vaping Use   Vaping Use: Never used  Substance and Sexual Activity   Alcohol use: Yes    Comment: rare   Drug use: Never   Sexual activity: Not Currently  Other Topics Concern   Not on file  Social History Narrative   Not on file   Social Determinants of Health   Financial Resource Strain: Not on file  Food Insecurity: Not on file  Transportation Needs: Not on file  Physical Activity: Not on file  Stress: Not on file  Social Connections: Not on file  Intimate Partner Violence:  Not on file    Past Medical History, Surgical history, Social history, and Family history were reviewed and updated as appropriate.   Please see review of systems for further details on the patient's review from today.   Objective:   Physical Exam:  LMP 02/22/2019 (Approximate)   Physical Exam Constitutional:      General: She is not in acute distress. Musculoskeletal:        General: No deformity.  Neurological:     Mental Status: She is alert and oriented to person, place, and time.     Coordination: Coordination normal.  Psychiatric:        Attention and Perception: Attention and perception normal. She does not perceive auditory or visual hallucinations.        Mood and Affect: Mood is anxious and depressed. Affect is not labile, blunt, angry or inappropriate.        Speech: Speech normal. Speech is not rapid and pressured.        Behavior: Behavior normal.        Thought  Content: Thought content normal. Thought content is not paranoid or delusional. Thought content does not include homicidal or suicidal ideation.        Cognition and Memory: Cognition and memory normal.        Judgment: Judgment normal.     Comments: Insight intact Still some anhedonia and depression     Lab Review:     Component Value Date/Time   NA 136 (A) 12/22/2021 0000   K 4.4 12/22/2021 0000   CL 102 12/22/2021 0000   CO2 23 (A) 12/22/2021 0000   GLUCOSE 234 (H) 06/07/2020 0525   BUN 14 12/22/2021 0000   CREATININE 1.2 (A) 12/22/2021 0000   CREATININE 0.97 06/07/2020 0525   CALCIUM 9.5 12/22/2021 0000   PROT 5.2 (L) 06/07/2020 0525   ALBUMIN 4.3 12/22/2021 0000   AST 28 12/22/2021 0000   ALT 25 12/22/2021 0000   ALKPHOS 155 (A) 12/22/2021 0000   BILITOT 0.3 06/07/2020 0525   GFRNONAA >60 06/07/2020 0525       Component Value Date/Time   WBC 6.1 12/22/2021 0000   WBC 4.8 06/07/2020 0525   RBC 4.6 12/22/2021 0000   HGB 14.3 12/22/2021 0000   HCT 44 12/22/2021 0000   PLT 97 (A) 12/22/2021 0000   MCV 94 11/27/2021 0000   MCH 32.6 09/16/2020 0000   MCHC 33 09/16/2020 0000   RDW 13.5 06/07/2020 0525   LYMPHSABS 1.0 06/07/2020 0525   MONOABS 0.4 06/07/2020 0525   EOSABS 0.0 06/07/2020 0525   BASOSABS 0.0 06/07/2020 0525    No results found for: "POCLITH", "LITHIUM"   No results found for: "PHENYTOIN", "PHENOBARB", "VALPROATE", "CBMZ"   .res Assessment: Plan:    Severe recurrent major depression with psychotic features ([place]) - Plan: QUEtiapine (SEROQUEL) 25 MG tablet, buPROPion (WELLBUTRIN XL) 150 MG 24 hr tablet, modafinil (PROVIGIL) 200 MG tablet  Mixed obsessional thoughts and acts - Plan: QUEtiapine (SEROQUEL) 25 MG tablet  Delayed sleep phase syndrome  Neoplastic malignant related fatigue - Plan: modafinil (PROVIGIL) 200 MG tablet  Mild cognitive impairment - Plan: modafinil (PROVIGIL) 200 MG tablet   Overall mood and anxiety are less well managed.   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 In general has handled this well except some trouble going to sleep on occ with anxiety.  However more recently she is still too down and  flat.  Try to avoid Xanax if possible BC DDI with Luvox. Ambien 5 prn.  Disc amnesia risk.  No benefit 200 mg modafinil nor SE therefore OK continue Modafinil to 300 mg daily for excessive sleepiness  Discussed potential metabolic side effects associated with atypical antipsychotics, as well as potential risk for movement side effects. Advised pt to contact office if movement side effects occur.   Counseled patient regarding potential benefits, risks, and side effects of Lamictal to include potential risk of Stevens-Johnson syndrome. Advised patient to stop taking Lamictal and contact office immediately if rash develops and to seek urgent medical attention if rash is severe and/or spreading quickly.  HX RELAPSE PSYCHOSIS WITH GENERIC QUETIAPINE, Continue lamotrigine 150 mg daily  Continue Vraylar 3 mg every other day  Try again to Reduce Seroquel to 25-50 mg HS Wants to have access to Ambien Continue Luvox to 2 and 1/2 tablets Increase Wellbutrin to 450 mg AM residual depression anhedonia  Disc insomnia is frequent problem trying to wean off Seroquel.  Disc polypharmacy and will try to eliminate some meds if depression improves.  FU 8 weeks  Lynder Parents, MD, DFAPA   Please see After Visit Summary for patient specific instructions.  Future Appointments  Date Time Provider Combine  12/24/2021 11:30 AM [facility] INFUSION CHAIR 8 [facility] None  12/26/2021  2:00 PM [facility] INFUSION CHAIR 8 [facility] None  12/31/2021  2:30 PM [facility] INFUSION CHAIR 3 [facility] None  01/01/2022  2:30 PM [facility] INFUSION CHAIR 3 [facility] None  01/02/2022  2:30 PM [facility] INFUSION CHAIR 3 [facility] None  01/12/2022  2:00 PM [facility] None  01/12/2022   2:30 PM Lewis, Dequincy A, MD [facility] None  01/14/2022 10:00 AM [facility] INFUSION CHAIR 7 [facility] None  01/16/2022  2:15 PM [facility] INFUSION CHAIR 8 [facility] None  01/21/2022  2:30 PM [facility] INFUSION CHAIR 3 [facility] None  01/22/2022  2:30 PM [facility] INFUSION CHAIR 3 [facility] None  01/23/2022 10:00 AM [facility]FUSION CHAIR 7 [facility] None  03/09/2022  7:00 AM [facility]OR BOARD CONFERENCE [facility] None  03/09/2022 10:30 AM Vaslow, Acey Lav, MD [facility] None    No orders of the defined types were placed in this encounter.      -------------------------------

## 2021-12-23 ENCOUNTER — Encounter: Payer: Self-pay | Admitting: Oncology

## 2021-12-23 ENCOUNTER — Other Ambulatory Visit: Payer: Self-pay

## 2021-12-23 MED FILL — Dexamethasone Sodium Phosphate Inj 100 MG/10ML: INTRAMUSCULAR | Qty: 1 | Status: AC

## 2021-12-23 MED FILL — Fosaprepitant Dimeglumine For IV Infusion 150 MG (Base Eq): INTRAVENOUS | Qty: 5 | Status: AC

## 2021-12-23 MED FILL — Irinotecan HCl Inj 100 MG/5ML (20 MG/ML): INTRAVENOUS | Qty: 14 | Status: AC

## 2021-12-23 MED FILL — Fluorouracil IV Soln 2.5 GM/50ML (50 MG/ML): INTRAVENOUS | Qty: 16 | Status: AC

## 2021-12-23 MED FILL — Bevacizumab-bvzr IV Soln 400 MG/16ML (For Infusion): INTRAVENOUS | Qty: 16 | Status: AC

## 2021-12-23 MED FILL — Fluorouracil IV Soln 5 GM/100ML (50 MG/ML): INTRAVENOUS | Qty: 100 | Status: AC

## 2021-12-23 NOTE — Progress Notes (Signed)
Ok to treat with 12/22/21 pltc - 97.   Raul Del Sparks, [place], BCPS, BCOP 12/23/2021 11:18 AM

## 2021-12-24 ENCOUNTER — Inpatient Hospital Stay: Payer: 59

## 2021-12-24 VITALS — BP 125/74 | HR 101 | Temp 98.2°F | Resp 18 | Ht 68.0 in | Wt 173.0 lb

## 2021-12-24 DIAGNOSIS — C182 Malignant neoplasm of ascending colon: Secondary | ICD-10-CM

## 2021-12-24 DIAGNOSIS — Z5111 Encounter for antineoplastic chemotherapy: Secondary | ICD-10-CM | POA: Diagnosis not present

## 2021-12-24 MED ORDER — FLUOROURACIL CHEMO INJECTION 2.5 GM/50ML
800.0000 mg | Freq: Once | INTRAVENOUS | Status: AC
  Administered 2021-12-24: 800 mg via INTRAVENOUS
  Filled 2021-12-24: qty 16

## 2021-12-24 MED ORDER — SODIUM CHLORIDE 0.9 % IV SOLN
10.0000 mg | Freq: Once | INTRAVENOUS | Status: AC
  Administered 2021-12-24: 10 mg via INTRAVENOUS
  Filled 2021-12-24: qty 10

## 2021-12-24 MED ORDER — SODIUM CHLORIDE 0.9 % IV SOLN
2400.0000 mg/m2 | INTRAVENOUS | Status: DC
  Administered 2021-12-24: 5000 mg via INTRAVENOUS
  Filled 2021-12-24: qty 100

## 2021-12-24 MED ORDER — DIPHENHYDRAMINE HCL 50 MG/ML IJ SOLN
25.0000 mg | Freq: Once | INTRAMUSCULAR | Status: AC
  Administered 2021-12-24: 25 mg via INTRAVENOUS
  Filled 2021-12-24: qty 1

## 2021-12-24 MED ORDER — ATROPINE SULFATE 1 MG/ML IV SOLN
0.5000 mg | Freq: Once | INTRAVENOUS | Status: AC | PRN
  Administered 2021-12-24: 0.5 mg via INTRAVENOUS
  Filled 2021-12-24: qty 1

## 2021-12-24 MED ORDER — SODIUM CHLORIDE 0.9 % IV SOLN: Freq: Once | INTRAVENOUS | Status: DC

## 2021-12-24 MED ORDER — FAMOTIDINE IN NACL 20-0.9 MG/50ML-% IV SOLN
20.0000 mg | Freq: Once | INTRAVENOUS | Status: AC
  Administered 2021-12-24: 20 mg via INTRAVENOUS
  Filled 2021-12-24: qty 50

## 2021-12-24 MED ORDER — HEPARIN SOD (PORK) LOCK FLUSH 100 UNIT/ML IV SOLN: 500.0000 [IU] | Freq: Once | INTRAVENOUS | Status: DC | PRN

## 2021-12-24 MED ORDER — SODIUM CHLORIDE 0.9% FLUSH: 10.0000 mL | INTRAVENOUS | Status: DC | PRN

## 2021-12-24 MED ORDER — IRINOTECAN HCL CHEMO INJECTION 100 MG/5ML
135.0000 mg/m2 | Freq: Once | INTRAVENOUS | Status: AC
  Administered 2021-12-24: 280 mg via INTRAVENOUS
  Filled 2021-12-24: qty 10

## 2021-12-24 MED ORDER — SODIUM CHLORIDE 0.9 % IV SOLN
150.0000 mg | Freq: Once | INTRAVENOUS | Status: AC
  Administered 2021-12-24: 150 mg via INTRAVENOUS
  Filled 2021-12-24: qty 150

## 2021-12-24 MED ORDER — SODIUM CHLORIDE 0.9 % IV SOLN: Freq: Once | INTRAVENOUS | Status: AC

## 2021-12-24 MED ORDER — SODIUM CHLORIDE 0.9 % IV SOLN
5.0000 mg/kg | Freq: Once | INTRAVENOUS | Status: AC
  Administered 2021-12-24: 400 mg via INTRAVENOUS
  Filled 2021-12-24: qty 16

## 2021-12-24 MED ORDER — PALONOSETRON HCL INJECTION 0.25 MG/5ML
0.2500 mg | Freq: Once | INTRAVENOUS | Status: AC
  Administered 2021-12-24: 0.25 mg via INTRAVENOUS
  Filled 2021-12-24: qty 5

## 2021-12-24 NOTE — Patient Instructions (Signed)
The chemotherapy medication bag should finish at 46 hours, 96 hours, or 7 days. For example, if your pump is scheduled for 46 hours and it was put on at 4:00 p.m., it should finish at 2:00 p.m. the day it is scheduled to come off regardless of your appointment time.     Estimated time to finish at 1530.   If the display on your pump reads "Low Volume" and it is beeping, take the batteries out of the pump and come to the cancer center for it to be taken off.   If the pump alarms go off prior to the pump reading "Low Volume" then call [phone] and someone can assist you.  If the plunger comes out and the chemotherapy medication is leaking out, please use your home chemo spill kit to clean up the spill. Do NOT use paper towels or other household products.  If you have problems or questions regarding your pump, please call either [phone] (24 hours a day) or the cancer center Monday-Friday 8:00 a.m.- 4:30 p.m. at the clinic number and we will assist you. If you are unable to get assistance, then go to the nearest Emergency Department and ask the staff to contact the IV team for assistance.   Fluorouracil, 5-FU injection What is this medication? FLUOROURACIL, 5-FU (flure oh YOOR a sil) is a chemotherapy drug. It slows the growth of cancer cells. This medicine is used to treat many types of cancer like breast cancer, colon or rectal cancer, pancreatic cancer, and stomach cancer. This medicine may be used for other purposes; ask your health care provider or pharmacist if you have questions. COMMON BRAND NAME(S): Adrucil What should I tell my care team before I take this medication? They need to know if you have any of these conditions: blood disorders dihydropyrimidine dehydrogenase (DPD) deficiency infection (especially a virus infection such as chickenpox, cold sores, or herpes) kidney disease liver disease malnourished, poor nutrition recent or ongoing radiation therapy an unusual or  allergic reaction to fluorouracil, other chemotherapy, other medicines, foods, dyes, or preservatives pregnant or trying to get pregnant breast-feeding How should I use this medication? This drug is given as an infusion or injection into a vein. It is administered in a hospital or clinic by a specially trained health care professional. Talk to your pediatrician regarding the use of this medicine in children. Special care may be needed. Overdosage: If you think you have taken too much of this medicine contact a poison control center or emergency room at once. NOTE: This medicine is only for you. Do not share this medicine with others. What if I miss a dose? It is important not to miss your dose. Call your doctor or health care professional if you are unable to keep an appointment. What may interact with this medication? Do not take this medicine with any of the following medications: live virus vaccines This medicine may also interact with the following medications: medicines that treat or prevent blood clots like warfarin, enoxaparin, and dalteparin This list may not describe all possible interactions. Give your health care provider a list of all the medicines, herbs, non-prescription drugs, or dietary supplements you use. Also tell them if you smoke, drink alcohol, or use illegal drugs. Some items may interact with your medicine. What should I watch for while using this medication? Visit your doctor for checks on your progress. This drug may make you feel generally unwell. This is not uncommon, as chemotherapy can affect healthy cells as well  as cancer cells. Report any side effects. Continue your course of treatment even though you feel ill unless your doctor tells you to stop. In some cases, you may be given additional medicines to help with side effects. Follow all directions for their use. Call your doctor or health care professional for advice if you get a fever, chills or sore throat, or  other symptoms of a cold or flu. Do not treat yourself. This drug decreases your body's ability to fight infections. Try to avoid being around people who are sick. This medicine may increase your risk to bruise or bleed. Call your doctor or health care professional if you notice any unusual bleeding. Be careful brushing and flossing your teeth or using a toothpick because you may get an infection or bleed more easily. If you have any dental work done, tell your dentist you are receiving this medicine. Avoid taking products that contain aspirin, acetaminophen, ibuprofen, naproxen, or ketoprofen unless instructed by your doctor. These medicines may hide a fever. Do not become pregnant while taking this medicine. Women should inform their doctor if they wish to become pregnant or think they might be pregnant. There is a potential for serious side effects to an unborn child. Talk to your health care professional or pharmacist for more information. Do not breast-feed an infant while taking this medicine. Men should inform their doctor if they wish to father a child. This medicine may lower sperm counts. Do not treat diarrhea with over the counter products. Contact your doctor if you have diarrhea that lasts more than 2 days or if it is severe and watery. This medicine can make you more sensitive to the sun. Keep out of the sun. If you cannot avoid being in the sun, wear protective clothing and use sunscreen. Do not use sun lamps or tanning beds/booths. What side effects may I notice from receiving this medication? Side effects that you should report to your doctor or health care professional as soon as possible: allergic reactions like skin rash, itching or hives, swelling of the face, lips, or tongue low blood counts - this medicine may decrease the number of white blood cells, red blood cells and platelets. You may be at increased risk for infections and bleeding. signs of infection - fever or chills,  cough, sore throat, pain or difficulty passing urine signs of decreased platelets or bleeding - bruising, pinpoint red spots on the skin, black, tarry stools, blood in the urine signs of decreased red blood cells - unusually weak or tired, fainting spells, lightheadedness breathing problems changes in vision chest pain mouth sores nausea and vomiting pain, swelling, redness at site where injected pain, tingling, numbness in the hands or feet redness, swelling, or sores on hands or feet stomach pain unusual bleeding Side effects that usually do not require medical attention (report to your doctor or health care professional if they continue or are bothersome): changes in finger or toe nails diarrhea dry or itchy skin hair loss headache loss of appetite sensitivity of eyes to the light stomach upset unusually teary eyes This list may not describe all possible side effects. Call your doctor for medical advice about side effects. You may report side effects to FDA at 1-800-FDA-1088. Where should I keep my medication? This drug is given in a hospital or clinic and will not be stored at home. NOTE: This sheet is a summary. It may not cover all possible information. If you have questions about this medicine, talk to your doctor,  pharmacist, or health care provider.  2023 Elsevier/Gold Standard (2021-04-18 00:00:00) Irinotecan injection What is this medication? IRINOTECAN (ir in oh TEE kan ) is a chemotherapy drug. It is used to treat colon and rectal cancer. This medicine may be used for other purposes; ask your health care provider or pharmacist if you have questions. COMMON BRAND NAME(S): Camptosar What should I tell my care team before I take this medication? They need to know if you have any of these conditions: dehydration diarrhea infection (especially a virus infection such as chickenpox, cold sores, or herpes) liver disease low blood counts, like low white cell, platelet, or  red cell counts low levels of calcium, magnesium, or potassium in the blood recent or ongoing radiation therapy an unusual or allergic reaction to irinotecan, other medicines, foods, dyes, or preservatives pregnant or trying to get pregnant breast-feeding How should I use this medication? This drug is given as an infusion into a vein. It is administered in a hospital or clinic by a specially trained health care professional. Talk to your pediatrician regarding the use of this medicine in children. Special care may be needed. Overdosage: If you think you have taken too much of this medicine contact a poison control center or emergency room at once. NOTE: This medicine is only for you. Do not share this medicine with others. What if I miss a dose? It is important not to miss your dose. Call your doctor or health care professional if you are unable to keep an appointment. What may interact with this medication? Do not take this medicine with any of the following medications: cobicistat itraconazole This medicine may interact with the following medications: antiviral medicines for HIV or AIDS certain antibiotics like rifampin or rifabutin certain medicines for fungal infections like ketoconazole, posaconazole, and voriconazole certain medicines for seizures like carbamazepine, phenobarbital, phenotoin clarithromycin gemfibrozil nefazodone [facility]. John's Wort This list may not describe all possible interactions. Give your health care provider a list of all the medicines, herbs, non-prescription drugs, or dietary supplements you use. Also tell them if you smoke, drink alcohol, or use illegal drugs. Some items may interact with your medicine. What should I watch for while using this medication? Your condition will be monitored carefully while you are receiving this medicine. You will need important blood work done while you are taking this medicine. This drug may make you feel generally unwell.  This is not uncommon, as chemotherapy can affect healthy cells as well as cancer cells. Report any side effects. Continue your course of treatment even though you feel ill unless your doctor tells you to stop. In some cases, you may be given additional medicines to help with side effects. Follow all directions for their use. You may get drowsy or dizzy. Do not drive, use machinery, or do anything that needs mental alertness until you know how this medicine affects you. Do not stand or sit up quickly, especially if you are an older patient. This reduces the risk of dizzy or fainting spells. Call your health care professional for advice if you get a fever, chills, or sore throat, or other symptoms of a cold or flu. Do not treat yourself. This medicine decreases your body's ability to fight infections. Try to avoid being around people who are sick. Avoid taking products that contain aspirin, acetaminophen, ibuprofen, naproxen, or ketoprofen unless instructed by your doctor. These medicines may hide a fever. This medicine may increase your risk to bruise or bleed. Call your doctor or health  care professional if you notice any unusual bleeding. Be careful brushing and flossing your teeth or using a toothpick because you may get an infection or bleed more easily. If you have any dental work done, tell your dentist you are receiving this medicine. Do not become pregnant while taking this medicine or for 6 months after stopping it. Women should inform their health care professional if they wish to become pregnant or think they might be pregnant. Men should not father a child while taking this medicine and for 3 months after stopping it. There is potential for serious side effects to an unborn child. Talk to your health care professional for more information. Do not breast-feed an infant while taking this medicine or for 7 days after stopping it. This medicine has caused ovarian failure in some women. This medicine  may make it more difficult to get pregnant. Talk to your health care professional if you are concerned about your fertility. This medicine has caused decreased sperm counts in some men. This may make it more difficult to father a child. Talk to your health care professional if you are concerned about your fertility. What side effects may I notice from receiving this medication? Side effects that you should report to your doctor or health care professional as soon as possible: allergic reactions like skin rash, itching or hives, swelling of the face, lips, or tongue chest pain diarrhea flushing, runny nose, sweating during infusion low blood counts - this medicine may decrease the number of white blood cells, red blood cells and platelets. You may be at increased risk for infections and bleeding. nausea, vomiting pain, swelling, warmth in the leg signs of decreased platelets or bleeding - bruising, pinpoint red spots on the skin, black, tarry stools, blood in the urine signs of infection - fever or chills, cough, sore throat, pain or difficulty passing urine signs of decreased red blood cells - unusually weak or tired, fainting spells, lightheadedness Side effects that usually do not require medical attention (report to your doctor or health care professional if they continue or are bothersome): constipation hair loss headache loss of appetite mouth sores stomach pain This list may not describe all possible side effects. Call your doctor for medical advice about side effects. You may report side effects to FDA at 1-800-FDA-1088. Where should I keep my medication? This drug is given in a hospital or clinic and will not be stored at home. NOTE: This sheet is a summary. It may not cover all possible information. If you have questions about this medicine, talk to your doctor, pharmacist, or health care provider.  2023 Elsevier/Gold Standard (2021-04-18 00:00:00) Bevacizumab injection What is  this medication? BEVACIZUMAB (be va SIZ yoo mab) is a monoclonal antibody. It is used to treat many types of cancer. This medicine may be used for other purposes; ask your health care provider or pharmacist if you have questions. COMMON BRAND NAME(S): Alymsys, Avastin, MVASI, Noah Charon What should I tell my care team before I take this medication? They need to know if you have any of these conditions: diabetes heart disease high blood pressure history of coughing up blood prior anthracycline chemotherapy (e.g., doxorubicin, daunorubicin, epirubicin) recent or ongoing radiation therapy recent or planning to have surgery stroke an unusual or allergic reaction to bevacizumab, hamster proteins, mouse proteins, other medicines, foods, dyes, or preservatives pregnant or trying to get pregnant breast-feeding How should I use this medication? This medicine is for infusion into a vein. It is given by  a health care professional in a hospital or clinic setting. Talk to your pediatrician regarding the use of this medicine in children. Special care may be needed. Overdosage: If you think you have taken too much of this medicine contact a poison control center or emergency room at once. NOTE: This medicine is only for you. Do not share this medicine with others. What if I miss a dose? It is important not to miss your dose. Call your doctor or health care professional if you are unable to keep an appointment. What may interact with this medication? Interactions are not expected. This list may not describe all possible interactions. Give your health care provider a list of all the medicines, herbs, non-prescription drugs, or dietary supplements you use. Also tell them if you smoke, drink alcohol, or use illegal drugs. Some items may interact with your medicine. What should I watch for while using this medication? Your condition will be monitored carefully while you are receiving this medicine. You will  need important blood work and urine testing done while you are taking this medicine. This medicine may increase your risk to bruise or bleed. Call your doctor or health care professional if you notice any unusual bleeding. Before having surgery, talk to your health care provider to make sure it is ok. This drug can increase the risk of poor healing of your surgical site or wound. You will need to stop this drug for 28 days before surgery. After surgery, wait at least 28 days before restarting this drug. Make sure the surgical site or wound is healed enough before restarting this drug. Talk to your health care provider if questions. Do not become pregnant while taking this medicine or for 6 months after stopping it. Women should inform their doctor if they wish to become pregnant or think they might be pregnant. There is a potential for serious side effects to an unborn child. Talk to your health care professional or pharmacist for more information. Do not breast-feed an infant while taking this medicine and for 6 months after the last dose. This medicine has caused ovarian failure in some women. This medicine may interfere with the ability to have a child. You should talk to your doctor or health care professional if you are concerned about your fertility. What side effects may I notice from receiving this medication? Side effects that you should report to your doctor or health care professional as soon as possible: allergic reactions like skin rash, itching or hives, swelling of the face, lips, or tongue chest pain or chest tightness chills coughing up blood high fever seizures severe constipation signs and symptoms of bleeding such as bloody or black, tarry stools; red or dark-brown urine; spitting up blood or brown material that looks like coffee grounds; red spots on the skin; unusual bruising or bleeding from the eye, gums, or nose signs and symptoms of a blood clot such as breathing problems;  chest pain; severe, sudden headache; pain, swelling, warmth in the leg signs and symptoms of a stroke like changes in vision; confusion; trouble speaking or understanding; severe headaches; sudden numbness or weakness of the face, arm or leg; trouble walking; dizziness; loss of balance or coordination stomach pain sweating swelling of legs or ankles vomiting weight gain Side effects that usually do not require medical attention (report to your doctor or health care professional if they continue or are bothersome): back pain changes in taste decreased appetite dry skin nausea tiredness This list may not describe all  possible side effects. Call your doctor for medical advice about side effects. You may report side effects to FDA at 1-800-FDA-1088. Where should I keep my medication? This drug is given in a hospital or clinic and will not be stored at home. NOTE: This sheet is a summary. It may not cover all possible information. If you have questions about this medicine, talk to your doctor, pharmacist, or health care provider.  2023 Elsevier/Gold Standard (2021-04-18 00:00:00)

## 2021-12-26 ENCOUNTER — Inpatient Hospital Stay: Payer: 59

## 2021-12-26 VITALS — BP 116/79 | HR 94 | Temp 98.1°F | Resp 18 | Ht 68.0 in | Wt 176.0 lb

## 2021-12-26 DIAGNOSIS — Z5111 Encounter for antineoplastic chemotherapy: Secondary | ICD-10-CM | POA: Diagnosis not present

## 2021-12-26 DIAGNOSIS — C182 Malignant neoplasm of ascending colon: Secondary | ICD-10-CM

## 2021-12-26 MED ORDER — SODIUM CHLORIDE 0.9% FLUSH
10.0000 mL | INTRAVENOUS | Status: DC | PRN
  Administered 2021-12-26: 10 mL

## 2021-12-26 MED ORDER — HEPARIN SOD (PORK) LOCK FLUSH 100 UNIT/ML IV SOLN
500.0000 [IU] | Freq: Once | INTRAVENOUS | Status: AC | PRN
  Administered 2021-12-26: 500 [IU]

## 2021-12-26 NOTE — Progress Notes (Signed)
The chemotherapy medication bag should finish at 46 hours, 96 hours, or 7 days. For example, if your pump is scheduled for 46 hours and it was put on at 4:00 p.m., it should finish at 2:00 p.m. the day it is scheduled to come off regardless of your appointment time.     Estimated time to finish at 1500.   If the display on your pump reads "Low Volume" and it is beeping, take the batteries out of the pump and come to the cancer center for it to be taken off.   If the pump alarms go off prior to the pump reading "Low Volume" then call [phone] and someone can assist you.  If the plunger comes out and the chemotherapy medication is leaking out, please use your home chemo spill kit to clean up the spill. Do NOT use paper towels or other household products.  If you have problems or questions regarding your pump, please call either [phone] (24 hours a day) or the cancer center Monday-Friday 8:00 a.m.- 4:30 p.m. at the clinic number and we will assist you. If you are unable to get assistance, then go to the nearest Emergency Department and ask the staff to contact the IV team for assistance.  1500 

## 2021-12-26 NOTE — Patient Instructions (Signed)
[facility]  Discharge Instructions: Thank you for choosing [facility] to provide your oncology and hematology care.  If you have a lab appointment with the [facility], please go directly to the [facility] and check in at the registration area.   Wear comfortable clothing and clothing appropriate for easy access to any Portacath or PICC line.   We strive to give you quality time with your provider. You may need to reschedule your appointment if you arrive late (15 or more minutes).  Arriving late affects you and other patients whose appointments are after yours.  Also, if you miss three or more appointments without notifying the office, you may be dismissed from the clinic at the provider's discretion.      For prescription refill requests, have your pharmacy contact our office and allow 72 hours for refills to be completed.    Today you received the following chemotherapy and/or immunotherapy agents 5FLOURUORACIL      To help prevent nausea and vomiting after your treatment, we encourage you to take your nausea medication as directed.  BELOW ARE SYMPTOMS THAT SHOULD BE REPORTED IMMEDIATELY: *FEVER GREATER THAN 100.4 F (38 C) OR HIGHER *CHILLS OR SWEATING *NAUSEA AND VOMITING THAT IS NOT CONTROLLED WITH YOUR NAUSEA MEDICATION *UNUSUAL SHORTNESS OF BREATH *UNUSUAL BRUISING OR BLEEDING *URINARY PROBLEMS (pain or burning when urinating, or frequent urination) *BOWEL PROBLEMS (unusual diarrhea, constipation, pain near the anus) TENDERNESS IN MOUTH AND THROAT WITH OR WITHOUT PRESENCE OF ULCERS (sore throat, sores in mouth, or a toothache) UNUSUAL RASH, SWELLING OR PAIN  UNUSUAL VAGINAL DISCHARGE OR ITCHING   Items with * indicate a potential emergency and should be followed up as soon as possible or go to the Emergency Department if any problems should occur.  Please show the CHEMOTHERAPY ALERT CARD or IMMUNOTHERAPY ALERT CARD at check-in to the  Emergency Department and triage nurse.  Should you have questions after your visit or need to cancel or reschedule your appointment, please contact [facility]  Dept: [phone]  and follow the prompts.  Office hours are 8:00 a.m. to 4:30 p.m. Monday - Friday. Please note that voicemails left after 4:00 p.m. may not be returned until the following business day.  We are closed weekends and major holidays. You have access to a nurse at all times for urgent questions. Please call the main number to the clinic Dept: [phone] and follow the prompts.  For any non-urgent questions, you may also contact your provider using MyChart. We now offer e-Visits for anyone 97 and older to request care online for non-urgent symptoms. For details visit [email or web address].   Also download the MyChart app! Go to the app store, search "MyChart", open the app, select [facility], and log in with your MyChart username and password.  Masks are optional in the cancer centers. If you would like for your care team to wear a mask while they are taking care of you, please let them know. For doctor visits, patients may have with them one support person who is at least 54 years old. At this time, visitors are not allowed in the infusion area.

## 2021-12-31 ENCOUNTER — Inpatient Hospital Stay: Payer: 59 | Attending: Oncology

## 2021-12-31 VITALS — BP 117/80 | HR 102 | Temp 98.7°F | Resp 18 | Ht 68.0 in | Wt 176.0 lb

## 2021-12-31 DIAGNOSIS — C7802 Secondary malignant neoplasm of left lung: Secondary | ICD-10-CM | POA: Diagnosis not present

## 2021-12-31 DIAGNOSIS — C189 Malignant neoplasm of colon, unspecified: Secondary | ICD-10-CM | POA: Diagnosis present

## 2021-12-31 DIAGNOSIS — Z5111 Encounter for antineoplastic chemotherapy: Secondary | ICD-10-CM | POA: Diagnosis present

## 2021-12-31 DIAGNOSIS — C7971 Secondary malignant neoplasm of right adrenal gland: Secondary | ICD-10-CM | POA: Insufficient documentation

## 2021-12-31 DIAGNOSIS — Z5189 Encounter for other specified aftercare: Secondary | ICD-10-CM | POA: Insufficient documentation

## 2021-12-31 DIAGNOSIS — C182 Malignant neoplasm of ascending colon: Secondary | ICD-10-CM

## 2021-12-31 DIAGNOSIS — C7931 Secondary malignant neoplasm of brain: Secondary | ICD-10-CM | POA: Diagnosis not present

## 2021-12-31 DIAGNOSIS — C7801 Secondary malignant neoplasm of right lung: Secondary | ICD-10-CM | POA: Diagnosis not present

## 2021-12-31 MED ORDER — FILGRASTIM-SNDZ 480 MCG/0.8ML IJ SOSY
480.0000 ug | PREFILLED_SYRINGE | Freq: Once | INTRAMUSCULAR | Status: AC
  Administered 2021-12-31: 480 ug via SUBCUTANEOUS
  Filled 2021-12-31: qty 0.8

## 2021-12-31 NOTE — Patient Instructions (Signed)
Filgrastim, G-CSF injection ?What is this medication? ?FILGRASTIM, G-CSF (fil GRA stim) is a granulocyte colony-stimulating factor that stimulates the growth of neutrophils, a type of white blood cell (WBC) important in the body's fight against infection. It is used to reduce the incidence of fever and infection in patients with certain types of cancer who are receiving chemotherapy that affects the bone marrow, to stimulate blood cell production for removal of WBCs from the body prior to a bone marrow transplantation, to reduce the incidence of fever and infection in patients who have severe chronic neutropenia, and to improve survival outcomes following high-dose radiation exposure that is toxic to the bone marrow. ?This medicine may be used for other purposes; ask your health care provider or pharmacist if you have questions. ?COMMON BRAND NAME(S): Neupogen, Nivestym, Releuko, Zarxio ?What should I tell my care team before I take this medication? ?They need to know if you have any of these conditions: ?kidney disease ?latex allergy ?ongoing radiation therapy ?sickle cell disease ?an unusual or allergic reaction to filgrastim, pegfilgrastim, other medicines, foods, dyes, or preservatives ?pregnant or trying to get pregnant ?breast-feeding ?How should I use this medication? ?This medicine is for injection under the skin or infusion into a vein. As an infusion into a vein, it is usually given by a health care professional in a hospital or clinic setting. If you get this medicine at home, you will be taught how to prepare and give this medicine. Refer to the Instructions for Use that come with your medication packaging. Use exactly as directed. Take your medicine at regular intervals. Do not take your medicine more often than directed. ?It is important that you put your used needles and syringes in a special sharps container. Do not put them in a trash can. If you do not have a sharps container, call your pharmacist  or healthcare provider to get one. ?Talk to your pediatrician regarding the use of this medicine in children. While this drug may be prescribed for children as young as 7 months for selected conditions, precautions do apply. ?Overdosage: If you think you have taken too much of this medicine contact a poison control center or emergency room at once. ?NOTE: This medicine is only for you. Do not share this medicine with others. ?What if I miss a dose? ?It is important not to miss your dose. Call your doctor or health care professional if you miss a dose. ?What may interact with this medication? ?This medicine may interact with the following medications: ?medicines that may cause a release of neutrophils, such as lithium ?This list may not describe all possible interactions. Give your health care provider a list of all the medicines, herbs, non-prescription drugs, or dietary supplements you use. Also tell them if you smoke, drink alcohol, or use illegal drugs. Some items may interact with your medicine. ?What should I watch for while using this medication? ?Your condition will be monitored carefully while you are receiving this medicine. ?You may need blood work done while you are taking this medicine. ?Talk to your health care provider about your risk of cancer. You may be more at risk for certain types of cancer if you take this medicine. ?What side effects may I notice from receiving this medication? ?Side effects that you should report to your doctor or health care professional as soon as possible: ?allergic reactions like skin rash, itching or hives, swelling of the face, lips, or tongue ?back pain ?dizziness or feeling faint ?fever ?pain, redness, or   irritation at site where injected ?pinpoint red spots on the skin ?shortness of breath or breathing problems ?signs and symptoms of kidney injury like trouble passing urine, change in the amount of urine, or red or dark-brown urine ?stomach or side pain, or pain at  the shoulder ?swelling ?tiredness ?unusual bleeding or bruising ?Side effects that usually do not require medical attention (report to your doctor or health care professional if they continue or are bothersome): ?bone pain ?cough ?diarrhea ?hair loss ?headache ?muscle pain ?This list may not describe all possible side effects. Call your doctor for medical advice about side effects. You may report side effects to FDA at 1-800-FDA-1088. ?Where should I keep my medication? ?Keep out of the reach of children. ?Store in a refrigerator between 2 and 8 degrees C (36 and 46 degrees F). Do not freeze. Keep in carton to protect from light. Throw away this medicine if vials or syringes are left out of the refrigerator for more than 24 hours. Throw away any unused medicine after the expiration date. ?NOTE: This sheet is a summary. It may not cover all possible information. If you have questions about this medicine, talk to your doctor, pharmacist, or health care provider. ?? 2023 Elsevier/Gold Standard (2021-04-18 00:00:00) ? ?

## 2022-01-01 ENCOUNTER — Inpatient Hospital Stay: Payer: 59

## 2022-01-01 VITALS — BP 123/73 | HR 97 | Temp 97.3°F | Resp 16 | Wt 174.0 lb

## 2022-01-01 DIAGNOSIS — C182 Malignant neoplasm of ascending colon: Secondary | ICD-10-CM

## 2022-01-01 DIAGNOSIS — Z5111 Encounter for antineoplastic chemotherapy: Secondary | ICD-10-CM | POA: Diagnosis not present

## 2022-01-01 MED ORDER — FILGRASTIM-SNDZ 480 MCG/0.8ML IJ SOSY
480.0000 ug | PREFILLED_SYRINGE | Freq: Once | INTRAMUSCULAR | Status: AC
  Administered 2022-01-01: 480 ug via SUBCUTANEOUS
  Filled 2022-01-01: qty 0.8

## 2022-01-01 NOTE — Patient Instructions (Signed)
Filgrastim Injection What is this medication? FILGRASTIM (fil GRA stim) lowers the risk of infection in people who are receiving chemotherapy. It works by helping your body make more white blood cells, which protects your body from infection. It may also be used to help people who have been exposed to high doses of radiation. It can be used to help prepare your body before a stem cell transplant. It works by helping your bone marrow make and release stem cells into the blood. This medicine may be used for other purposes; ask your health care provider or pharmacist if you have questions. COMMON BRAND NAME(S): Neupogen, Nivestym, Releuko, Zarxio What should I tell my care team before I take this medication? They need to know if you have any of these conditions: History of blood diseases, such as sickle cell anemia Kidney disease Recent or ongoing radiation An unusual or allergic reaction to filgrastim, pegfilgrastim, latex, rubber, other medications, foods, dyes, or preservatives Pregnant or trying to get pregnant Breast-feeding How should I use this medication? This medication is injected under the skin or into a vein. It is usually given by your care team in a hospital or clinic setting. It may be given at home. If you get this medication at home, you will be taught how to prepare and give it. Use exactly as directed. Take it as directed on the prescription label at the same time every day. Keep taking it unless your care team tells you to stop. It is important that you put your used needles and syringes in a special sharps container. Do not put them in a trash can. If you do not have a sharps container, call your pharmacist or care team to get one. This medication comes with INSTRUCTIONS FOR USE. Ask your pharmacist for directions on how to use this medication. Read the information carefully. Talk to your pharmacist or care team if you have questions. Talk to your care team about the use of this  medication in children. While it may be prescribed for children for selected conditions, precautions do apply. Overdosage: If you think you have taken too much of this medicine contact a poison control center or emergency room at once. NOTE: This medicine is only for you. Do not share this medicine with others. What if I miss a dose? It is important not to miss any doses. Talk to your care team about what to do if you miss a dose. What may interact with this medication? Medications that may cause a release of neutrophils, such as lithium This list may not describe all possible interactions. Give your health care provider a list of all the medicines, herbs, non-prescription drugs, or dietary supplements you use. Also tell them if you smoke, drink alcohol, or use illegal drugs. Some items may interact with your medicine. What should I watch for while using this medication? Your condition will be monitored carefully while you are receiving this medication. You may need bloodwork while taking this medication. Talk to your care team about your risk of cancer. You may be more at risk for certain types of cancer if you take this medication. What side effects may I notice from receiving this medication? Side effects that you should report to your care team as soon as possible: Allergic reactions--skin rash, itching, hives, swelling of the face, lips, tongue, or throat Capillary leak syndrome--stomach or muscle pain, unusual weakness or fatigue, feeling faint or lightheaded, decrease in the amount of urine, swelling of the ankles, hands, or   feet, trouble breathing High white blood cell level--fever, fatigue, trouble breathing, night sweats, change in vision, weight loss Inflammation of the aorta--fever, fatigue, back, chest, or stomach pain, severe headache Kidney injury (glomerulonephritis)--decrease in the amount of urine, red or dark brown urine, foamy or bubbly urine, swelling of the ankles, hands, or  feet Shortness of breath or trouble breathing Spleen injury--pain in upper left stomach or shoulder Unusual bruising or bleeding Side effects that usually do not require medical attention (report to your care team if they continue or are bothersome): Back pain Bone pain Fatigue Fever Headache Nausea This list may not describe all possible side effects. Call your doctor for medical advice about side effects. You may report side effects to FDA at 1-800-FDA-1088. Where should I keep my medication? Keep out of the reach of children and pets. Keep this medication in the original packaging until you are ready to take it. Protect from light. See product for storage information. Each product may have different instructions. Get rid of any unused medication after the expiration date. To get rid of medications that are no longer needed or have expired: Take the medication to a medications take-back program. Check with your pharmacy or law enforcement to find a location. If you cannot return the medication, ask your pharmacist or care team how to get rid of this medication safely. NOTE: This sheet is a summary. It may not cover all possible information. If you have questions about this medicine, talk to your doctor, pharmacist, or health care provider.  2023 Elsevier/Gold Standard (2021-08-26 00:00:00)  

## 2022-01-02 ENCOUNTER — Inpatient Hospital Stay: Payer: 59

## 2022-01-02 VITALS — BP 117/83 | HR 103 | Temp 98.1°F | Resp 18 | Ht 68.0 in | Wt 175.2 lb

## 2022-01-02 DIAGNOSIS — Z5111 Encounter for antineoplastic chemotherapy: Secondary | ICD-10-CM | POA: Diagnosis not present

## 2022-01-02 DIAGNOSIS — C182 Malignant neoplasm of ascending colon: Secondary | ICD-10-CM

## 2022-01-02 MED ORDER — FILGRASTIM-SNDZ 480 MCG/0.8ML IJ SOSY
480.0000 ug | PREFILLED_SYRINGE | Freq: Once | INTRAMUSCULAR | Status: AC
  Administered 2022-01-02: 480 ug via SUBCUTANEOUS
  Filled 2022-01-02: qty 0.8

## 2022-01-02 NOTE — Patient Instructions (Signed)
Filgrastim Injection What is this medication? FILGRASTIM (fil GRA stim) lowers the risk of infection in people who are receiving chemotherapy. It works by helping your body make more white blood cells, which protects your body from infection. It may also be used to help people who have been exposed to high doses of radiation. It can be used to help prepare your body before a stem cell transplant. It works by helping your bone marrow make and release stem cells into the blood. This medicine may be used for other purposes; ask your health care provider or pharmacist if you have questions. COMMON BRAND NAME(S): Neupogen, Nivestym, Releuko, Zarxio What should I tell my care team before I take this medication? They need to know if you have any of these conditions: History of blood diseases, such as sickle cell anemia Kidney disease Recent or ongoing radiation An unusual or allergic reaction to filgrastim, pegfilgrastim, latex, rubber, other medications, foods, dyes, or preservatives Pregnant or trying to get pregnant Breast-feeding How should I use this medication? This medication is injected under the skin or into a vein. It is usually given by your care team in a hospital or clinic setting. It may be given at home. If you get this medication at home, you will be taught how to prepare and give it. Use exactly as directed. Take it as directed on the prescription label at the same time every day. Keep taking it unless your care team tells you to stop. It is important that you put your used needles and syringes in a special sharps container. Do not put them in a trash can. If you do not have a sharps container, call your pharmacist or care team to get one. This medication comes with INSTRUCTIONS FOR USE. Ask your pharmacist for directions on how to use this medication. Read the information carefully. Talk to your pharmacist or care team if you have questions. Talk to your care team about the use of this  medication in children. While it may be prescribed for children for selected conditions, precautions do apply. Overdosage: If you think you have taken too much of this medicine contact a poison control center or emergency room at once. NOTE: This medicine is only for you. Do not share this medicine with others. What if I miss a dose? It is important not to miss any doses. Talk to your care team about what to do if you miss a dose. What may interact with this medication? Medications that may cause a release of neutrophils, such as lithium This list may not describe all possible interactions. Give your health care provider a list of all the medicines, herbs, non-prescription drugs, or dietary supplements you use. Also tell them if you smoke, drink alcohol, or use illegal drugs. Some items may interact with your medicine. What should I watch for while using this medication? Your condition will be monitored carefully while you are receiving this medication. You may need bloodwork while taking this medication. Talk to your care team about your risk of cancer. You may be more at risk for certain types of cancer if you take this medication. What side effects may I notice from receiving this medication? Side effects that you should report to your care team as soon as possible: Allergic reactions--skin rash, itching, hives, swelling of the face, lips, tongue, or throat Capillary leak syndrome--stomach or muscle pain, unusual weakness or fatigue, feeling faint or lightheaded, decrease in the amount of urine, swelling of the ankles, hands, or   feet, trouble breathing High white blood cell level--fever, fatigue, trouble breathing, night sweats, change in vision, weight loss Inflammation of the aorta--fever, fatigue, back, chest, or stomach pain, severe headache Kidney injury (glomerulonephritis)--decrease in the amount of urine, red or dark brown urine, foamy or bubbly urine, swelling of the ankles, hands, or  feet Shortness of breath or trouble breathing Spleen injury--pain in upper left stomach or shoulder Unusual bruising or bleeding Side effects that usually do not require medical attention (report to your care team if they continue or are bothersome): Back pain Bone pain Fatigue Fever Headache Nausea This list may not describe all possible side effects. Call your doctor for medical advice about side effects. You may report side effects to FDA at 1-800-FDA-1088. Where should I keep my medication? Keep out of the reach of children and pets. Keep this medication in the original packaging until you are ready to take it. Protect from light. See product for storage information. Each product may have different instructions. Get rid of any unused medication after the expiration date. To get rid of medications that are no longer needed or have expired: Take the medication to a medications take-back program. Check with your pharmacy or law enforcement to find a location. If you cannot return the medication, ask your pharmacist or care team how to get rid of this medication safely. NOTE: This sheet is a summary. It may not cover all possible information. If you have questions about this medicine, talk to your doctor, pharmacist, or health care provider.  2023 Elsevier/Gold Standard (2021-08-26 00:00:00)  

## 2022-01-05 DIAGNOSIS — C182 Malignant neoplasm of ascending colon: Secondary | ICD-10-CM | POA: Diagnosis not present

## 2022-01-06 ENCOUNTER — Encounter: Payer: Self-pay | Admitting: Oncology

## 2022-01-11 ENCOUNTER — Encounter: Payer: Self-pay | Admitting: Oncology

## 2022-01-11 NOTE — Progress Notes (Signed)
[facility]  [address] [place],  [place]  [address] [phone]  Clinic Day:  01/12/2022  Referring physician: Greig Right, MD   HISTORY OF PRESENT ILLNESS:  The patient is a 56 y.o. female with  metastatic colon cancer, which includes spread of disease to her abdominal cavity, lungs, brain and right adrenal gland. She comes in today prior to her 33rd cycle of palliative FOLFIRI/Avastin chemotherapy.  Of note, leucovorin has been discontinued due to an allergic reaction from this agent that did not improve despite being predmedicated with diphenhydramine and famotidine. The patient claims to have tolerated her 32nd cycle of treatment fairly well.  She continues to report intermittent diarrhea, but is using Imodium as needed.  She denies her GI symptoms are getting worse to where she is concerned about disease progression.   With respect to her colon cancer history, she is status post a right hemicolectomy in early October 2018, followed by 12 cycles of adjuvant FOLFOX chemotherapy, which were completed in April 2019.  In December 2021, she underwent a left cerebellar metastasectomy, whose pathology was consistent with metastatic colon cancer.  Tumor testing did come back MMR normal.  CT scans revealed evidence of her cancer being in multiple locations, for which she has been on FOLFIRI/Avastin.  PHYSICAL EXAM:  Blood pressure 116/72, pulse 95, temperature 97.6 F (36.4 C), resp. rate 14, height 5' 8"  (1.727 m), weight 171 lb 11.2 oz (77.9 kg), last menstrual period 02/22/2019, SpO2 95 %.  Body mass index is 26.11 kg/m.  Performance status (ECOG): 1 - Symptomatic but completely ambulatory  Physical Exam Vitals and nursing note reviewed.  Constitutional:      General: She is not in acute distress.    Appearance: Normal appearance.  HENT:     Head: Normocephalic and atraumatic.     Mouth/Throat:     Mouth: Mucous membranes are moist.      Pharynx: Oropharynx is clear. No oropharyngeal exudate or posterior oropharyngeal erythema.  Eyes:     General: No scleral icterus.    Extraocular Movements: Extraocular movements intact.     Conjunctiva/sclera: Conjunctivae normal.     Pupils: Pupils are equal, round, and reactive to light.  Cardiovascular:     Rate and Rhythm: Normal rate and regular rhythm.     Heart sounds: Normal heart sounds. No murmur heard.    No friction rub. No gallop.  Pulmonary:     Effort: Pulmonary effort is normal.     Breath sounds: Normal breath sounds. No wheezing, rhonchi or rales.  Abdominal:     General: There is no distension.     Palpations: Abdomen is soft. There is no hepatomegaly, splenomegaly or mass.     Tenderness: There is no abdominal tenderness.  Musculoskeletal:        General: Normal range of motion.     Cervical back: Normal range of motion and neck supple. No tenderness.     Right lower leg: No edema.     Left lower leg: No edema.  Lymphadenopathy:     Cervical: No cervical adenopathy.     Upper Body:     Right upper body: No supraclavicular or axillary adenopathy.     Left upper body: No supraclavicular or axillary adenopathy.     Lower Body: No right inguinal adenopathy. No left inguinal adenopathy.  Skin:    General: Skin is warm and dry.     Coloration: Skin is not jaundiced.  Findings: No rash.  Neurological:     Mental Status: She is alert and oriented to person, place, and time.     Cranial Nerves: No cranial nerve deficit.  Psychiatric:        Mood and Affect: Mood normal.        Behavior: Behavior normal.        Thought Content: Thought content normal.    LABS:      Latest Ref Rng & Units 01/12/2022   12:00 AM 12/22/2021   12:00 AM 11/27/2021   12:00 AM  CBC  WBC  3.7     6.1     8.2      Hemoglobin 12.0 - 16.0 12.5     14.3     12.7      Hematocrit 36 - 46 38     44     39      Platelets 150 - 400 K/uL 101     97     112         This result is from an  external source.      Latest Ref Rng & Units 01/12/2022   12:00 AM 12/22/2021   12:00 AM 11/27/2021   12:00 AM  CMP  BUN 4 - 21 13     14     17       Creatinine 0.5 - 1.1 1.2     1.2     1.3      Sodium 137 - 147 139     136     138      Potassium 3.5 - 5.1 mEq/L 4.2     4.4     3.9      Chloride 99 - 108 103     102     104      CO2 13 - 22 29     23     24       Calcium 8.7 - 10.7 8.9     9.5     9.1      Alkaline Phos 25 - 125 129     155     132      AST 13 - 35 27     28     23       ALT 7 - 35 U/L 23     25     22          This result is from an external source.    ASSESSMENT & PLAN:  Assessment/Plan:  A 56 y.o. female with metastatic colon cancer.  She will proceed with her 33rd cycle of FOLFIRI/Avastin (without leucovorin) this week.  She will continue to receive this chemotherapy regimen once every 3 weeks.  Clinically, she appears to be doing well.  I will see her back in 3 weeks before she heads into her 34th cycle of FOLFIRI/Avastin.  The patient understands all the plans discussed today and is in agreement with them.    Peytyn Trine Macarthur Critchley, MD

## 2022-01-12 ENCOUNTER — Inpatient Hospital Stay: Payer: 59

## 2022-01-12 ENCOUNTER — Inpatient Hospital Stay (HOSPITAL_BASED_OUTPATIENT_CLINIC_OR_DEPARTMENT_OTHER): Payer: 59 | Admitting: Oncology

## 2022-01-12 VITALS — BP 116/72 | HR 95 | Temp 97.6°F | Resp 14 | Ht 68.0 in | Wt 171.7 lb

## 2022-01-12 DIAGNOSIS — C182 Malignant neoplasm of ascending colon: Secondary | ICD-10-CM

## 2022-01-12 DIAGNOSIS — C189 Malignant neoplasm of colon, unspecified: Secondary | ICD-10-CM

## 2022-01-12 LAB — HEPATIC FUNCTION PANEL
ALT: 23 U/L (ref 7–35)
AST: 27 (ref 13–35)
Alkaline Phosphatase: 129 — AB (ref 25–125)
Bilirubin, Total: 0.5

## 2022-01-12 LAB — BASIC METABOLIC PANEL
BUN: 13 (ref 4–21)
CO2: 29 — AB (ref 13–22)
Chloride: 103 (ref 99–108)
Creatinine: 1.2 — AB (ref 0.5–1.1)
Glucose: 173
Potassium: 4.2 mEq/L (ref 3.5–5.1)
Sodium: 139 (ref 137–147)

## 2022-01-12 LAB — CBC AND DIFFERENTIAL
HCT: 38 (ref 36–46)
Hemoglobin: 12.5 (ref 12.0–16.0)
Neutrophils Absolute: 2.33
Platelets: 101 10*3/uL — AB (ref 150–400)
WBC: 3.7

## 2022-01-12 LAB — COMPREHENSIVE METABOLIC PANEL
Albumin: 4.1 (ref 3.5–5.0)
Calcium: 8.9 (ref 8.7–10.7)

## 2022-01-12 LAB — CBC: RBC: 3.9 (ref 3.87–5.11)

## 2022-01-13 ENCOUNTER — Other Ambulatory Visit: Payer: Self-pay | Admitting: Pharmacist

## 2022-01-13 DIAGNOSIS — C182 Malignant neoplasm of ascending colon: Secondary | ICD-10-CM

## 2022-01-13 DIAGNOSIS — C189 Malignant neoplasm of colon, unspecified: Secondary | ICD-10-CM

## 2022-01-13 DIAGNOSIS — C7800 Secondary malignant neoplasm of unspecified lung: Secondary | ICD-10-CM

## 2022-01-13 MED FILL — Bevacizumab-awwb IV Soln 400 MG/16ML (For Infusion): INTRAVENOUS | Qty: 16 | Status: AC

## 2022-01-13 MED FILL — Fosaprepitant Dimeglumine For IV Infusion 150 MG (Base Eq): INTRAVENOUS | Qty: 5 | Status: AC

## 2022-01-13 MED FILL — Fluorouracil IV Soln 5 GM/100ML (50 MG/ML): INTRAVENOUS | Qty: 100 | Status: AC

## 2022-01-13 MED FILL — Dexamethasone Sodium Phosphate Inj 100 MG/10ML: INTRAMUSCULAR | Qty: 1 | Status: AC

## 2022-01-13 MED FILL — Irinotecan HCl Inj 100 MG/5ML (20 MG/ML): INTRAVENOUS | Qty: 14 | Status: AC

## 2022-01-13 MED FILL — Fluorouracil IV Soln 2.5 GM/50ML (50 MG/ML): INTRAVENOUS | Qty: 16 | Status: AC

## 2022-01-13 NOTE — Progress Notes (Signed)
The following biosimilar Mvasi (bevacizumab-awwb) has been selected for use in this patient.  

## 2022-01-14 ENCOUNTER — Inpatient Hospital Stay: Payer: 59

## 2022-01-14 VITALS — BP 129/68 | HR 79 | Temp 97.5°F | Resp 18 | Wt 173.1 lb

## 2022-01-14 DIAGNOSIS — C182 Malignant neoplasm of ascending colon: Secondary | ICD-10-CM

## 2022-01-14 DIAGNOSIS — C189 Malignant neoplasm of colon, unspecified: Secondary | ICD-10-CM

## 2022-01-14 DIAGNOSIS — Z5111 Encounter for antineoplastic chemotherapy: Secondary | ICD-10-CM | POA: Diagnosis not present

## 2022-01-14 DIAGNOSIS — C7800 Secondary malignant neoplasm of unspecified lung: Secondary | ICD-10-CM

## 2022-01-14 MED ORDER — SODIUM CHLORIDE 0.9 % IV SOLN
5.0000 mg/kg | Freq: Once | INTRAVENOUS | Status: AC
  Administered 2022-01-14: 400 mg via INTRAVENOUS
  Filled 2022-01-14: qty 16

## 2022-01-14 MED ORDER — HEPARIN SOD (PORK) LOCK FLUSH 100 UNIT/ML IV SOLN: 500.0000 [IU] | Freq: Once | INTRAVENOUS | Status: DC | PRN

## 2022-01-14 MED ORDER — SODIUM CHLORIDE 0.9 % IV SOLN
2575.0000 mg/m2 | INTRAVENOUS | Status: DC
  Administered 2022-01-14: 5000 mg via INTRAVENOUS
  Filled 2022-01-14: qty 100

## 2022-01-14 MED ORDER — SODIUM CHLORIDE 0.9% FLUSH: 10.0000 mL | INTRAVENOUS | Status: DC | PRN

## 2022-01-14 MED ORDER — SODIUM CHLORIDE 0.9 % IV SOLN: Freq: Once | INTRAVENOUS | Status: DC

## 2022-01-14 MED ORDER — FAMOTIDINE IN NACL 20-0.9 MG/50ML-% IV SOLN
20.0000 mg | Freq: Once | INTRAVENOUS | Status: AC
  Administered 2022-01-14: 20 mg via INTRAVENOUS

## 2022-01-14 MED ORDER — ATROPINE SULFATE 1 MG/ML IV SOLN
0.5000 mg | Freq: Once | INTRAVENOUS | Status: AC | PRN
  Administered 2022-01-14: 0.5 mg via INTRAVENOUS

## 2022-01-14 MED ORDER — FLUOROURACIL CHEMO INJECTION 2.5 GM/50ML
400.0000 mg/m2 | Freq: Once | INTRAVENOUS | Status: AC
  Administered 2022-01-14: 800 mg via INTRAVENOUS
  Filled 2022-01-14: qty 16

## 2022-01-14 MED ORDER — SODIUM CHLORIDE 0.9 % IV SOLN
10.0000 mg | Freq: Once | INTRAVENOUS | Status: AC
  Administered 2022-01-14: 10 mg via INTRAVENOUS
  Filled 2022-01-14: qty 10

## 2022-01-14 MED ORDER — SODIUM CHLORIDE 0.9 % IV SOLN: Freq: Once | INTRAVENOUS | Status: AC

## 2022-01-14 MED ORDER — IRINOTECAN HCL CHEMO INJECTION 100 MG/5ML
144.0000 mg/m2 | Freq: Once | INTRAVENOUS | Status: AC
  Administered 2022-01-14: 280 mg via INTRAVENOUS
  Filled 2022-01-14: qty 10

## 2022-01-14 MED ORDER — DIPHENHYDRAMINE HCL 50 MG/ML IJ SOLN
25.0000 mg | Freq: Once | INTRAMUSCULAR | Status: AC
  Administered 2022-01-14: 25 mg via INTRAVENOUS

## 2022-01-14 MED ORDER — PALONOSETRON HCL INJECTION 0.25 MG/5ML
0.2500 mg | Freq: Once | INTRAVENOUS | Status: AC
  Administered 2022-01-14: 0.25 mg via INTRAVENOUS

## 2022-01-14 MED ORDER — SODIUM CHLORIDE 0.9 % IV SOLN
150.0000 mg | Freq: Once | INTRAVENOUS | Status: AC
  Administered 2022-01-14: 150 mg via INTRAVENOUS
  Filled 2022-01-14: qty 150

## 2022-01-14 NOTE — Patient Instructions (Signed)
[facility]  Discharge Instructions: Thank you for choosing [facility] to provide your oncology and hematology care.  If you have a lab appointment with the [facility], please go directly to the [facility] and check in at the registration area.   Wear comfortable clothing and clothing appropriate for easy access to any Portacath or PICC line.   We strive to give you quality time with your provider. You may need to reschedule your appointment if you arrive late (15 or more minutes).  Arriving late affects you and other patients whose appointments are after yours.  Also, if you miss three or more appointments without notifying the office, you may be dismissed from the clinic at the provider's discretion.      For prescription refill requests, have your pharmacy contact our office and allow 72 hours for refills to be completed.    Today you received the following chemotherapy and/or immunotherapy agents: Zirabev, Irinotecan and Fluorouracil Fluorouracil Injection What is this medication? FLUOROURACIL (flure oh YOOR a sil) treats some types of cancer. It works by slowing down the growth of cancer cells. This medicine may be used for other purposes; ask your health care provider or pharmacist if you have questions. COMMON BRAND NAME(S): Adrucil What should I tell my care team before I take this medication? They need to know if you have any of these conditions: Blood disorders Dihydropyrimidine dehydrogenase (DPD) deficiency Infection, such as chickenpox, cold sores, herpes Kidney disease Liver disease Poor nutrition Recent or ongoing radiation therapy An unusual or allergic reaction to fluorouracil, other medications, foods, dyes, or preservatives If you or your partner are pregnant or trying to get pregnant Breast-feeding How should I use this medication? This medication is injected into a vein. It is administered by your care team in a hospital  or clinic setting. Talk to your care team about the use of this medication in children. Special care may be needed. Overdosage: If you think you have taken too much of this medicine contact a poison control center or emergency room at once. NOTE: This medicine is only for you. Do not share this medicine with others. What if I miss a dose? Keep appointments for follow-up doses. It is important not to miss your dose. Call your care team if you are unable to keep an appointment. What may interact with this medication? Do not take this medication with any of the following: Live virus vaccines This medication may also interact with the following: Medications that treat or prevent blood clots, such as warfarin, enoxaparin, dalteparin This list may not describe all possible interactions. Give your health care provider a list of all the medicines, herbs, non-prescription drugs, or dietary supplements you use. Also tell them if you smoke, drink alcohol, or use illegal drugs. Some items may interact with your medicine. What should I watch for while using this medication? Your condition will be monitored carefully while you are receiving this medication. This medication may make you feel generally unwell. This is not uncommon as chemotherapy can affect healthy cells as well as cancer cells. Report any side effects. Continue your course of treatment even though you feel ill unless your care team tells you to stop. In some cases, you may be given additional medications to help with side effects. Follow all directions for their use. This medication may increase your risk of getting an infection. Call your care team for advice if you get a fever, chills, sore throat,  or other symptoms of a cold or flu. Do not treat yourself. Try to avoid being around people who are sick. This medication may increase your risk to bruise or bleed. Call your care team if you notice any unusual bleeding. Be careful brushing or  flossing your teeth or using a toothpick because you may get an infection or bleed more easily. If you have any dental work done, tell your dentist you are receiving this medication. Avoid taking medications that contain aspirin, acetaminophen, ibuprofen, naproxen, or ketoprofen unless instructed by your care team. These medications may hide a fever. Do not treat diarrhea with over the counter products. Contact your care team if you have diarrhea that lasts more than 2 days or if it is severe and watery. This medication can make you more sensitive to the sun. Keep out of the sun. If you cannot avoid being in the sun, wear protective clothing and sunscreen. Do not use sun lamps, tanning beds, or tanning booths. Talk to your care team if you or your partner wish to become pregnant or think you might be pregnant. This medication can cause serious birth defects if taken during pregnancy and for 3 months after the last dose. A reliable form of contraception is recommended while taking this medication and for 3 months after the last dose. Talk to your care team about effective forms of contraception. Do not father a child while taking this medication and for 3 months after the last dose. Use a condom while having sex during this time period. Do not breastfeed while taking this medication. This medication may cause infertility. Talk to your care team if you are concerned about your fertility. What side effects may I notice from receiving this medication? Side effects that you should report to your care team as soon as possible: Allergic reactions--skin rash, itching, hives, swelling of the face, lips, tongue, or throat Heart attack--pain or tightness in the chest, shoulders, arms, or jaw, nausea, shortness of breath, cold or clammy skin, feeling faint or lightheaded Heart failure--shortness of breath, swelling of the ankles, feet, or hands, sudden weight gain, unusual weakness or fatigue Heart rhythm  changes--fast or irregular heartbeat, dizziness, feeling faint or lightheaded, chest pain, trouble breathing High ammonia level--unusual weakness or fatigue, confusion, loss of appetite, nausea, vomiting, seizures Infection--fever, chills, cough, sore throat, wounds that don't heal, pain or trouble when passing urine, general feeling of discomfort or being unwell Low red blood cell level--unusual weakness or fatigue, dizziness, headache, trouble breathing Pain, tingling, or numbness in the hands or feet, muscle weakness, change in vision, confusion or trouble speaking, loss of balance or coordination, trouble walking, seizures Redness, swelling, and blistering of the skin over hands and feet Severe or prolonged diarrhea Unusual bruising or bleeding Side effects that usually do not require medical attention (report to your care team if they continue or are bothersome): Dry skin Headache Increased tears Nausea Pain, redness, or swelling with sores inside the mouth or throat Sensitivity to light Vomiting This list may not describe all possible side effects. Call your doctor for medical advice about side effects. You may report side effects to FDA at 1-800-FDA-1088. Where should I keep my medication? This medication is given in a hospital or clinic. It will not be stored at home. NOTE: This sheet is a summary. It may not cover all possible information. If you have questions about this medicine, talk to your doctor, pharmacist, or health care provider.  2023 Elsevier/Gold Standard (2021-09-23 00:00:00) Irinotecan  Injection What is this medication? IRINOTECAN (ir in oh TEE kan) treats some types of cancer. It works by slowing down the growth of cancer cells. This medicine may be used for other purposes; ask your health care provider or pharmacist if you have questions. COMMON BRAND NAME(S): Camptosar What should I tell my care team before I take this medication? They need to know if you have  any of these conditions: Dehydration Diarrhea Infection, especially a viral infection, such as chickenpox, cold sores, herpes Liver disease Low blood cell levels (white cells, red cells, and platelets) Low levels of electrolytes, such as calcium, magnesium, or potassium in your blood Recent or ongoing radiation An unusual or allergic reaction to irinotecan, other medications, foods, dyes, or preservatives If you or your partner are pregnant or trying to get pregnant Breast-feeding How should I use this medication? This medication is injected into a vein. It is given by your care team in a hospital or clinic setting. Talk to your care team about the use of this medication in children. Special care may be needed. Overdosage: If you think you have taken too much of this medicine contact a poison control center or emergency room at once. NOTE: This medicine is only for you. Do not share this medicine with others. What if I miss a dose? Keep appointments for follow-up doses. It is important not to miss your dose. Call your care team if you are unable to keep an appointment. What may interact with this medication? Do not take this medication with any of the following: Cobicistat Itraconazole This medication may also interact with the following: Certain antibiotics, such as clarithromycin, rifampin, rifabutin Certain antivirals for HIV or AIDS Certain medications for fungal infections, such as ketoconazole, posaconazole, voriconazole Certain medications for seizures, such as carbamazepine, phenobarbital, phenytoin Gemfibrozil Nefazodone St. John's wort This list may not describe all possible interactions. Give your health care provider a list of all the medicines, herbs, non-prescription drugs, or dietary supplements you use. Also tell them if you smoke, drink alcohol, or use illegal drugs. Some items may interact with your medicine. What should I watch for while using this medication? Your  condition will be monitored carefully while you are receiving this medication. You may need blood work while taking this medication. This medication may make you feel generally unwell. This is not uncommon as chemotherapy can affect healthy cells as well as cancer cells. Report any side effects. Continue your course of treatment even though you feel ill unless your care team tells you to stop. This medication can cause serious side effects. To reduce the risk, your care team may give you other medications to take before receiving this one. Be sure to follow the directions from your care team. This medication may affect your coordination, reaction time, or judgement. Do not drive or operate machinery until you know how this medication affects you. Sit up or stand slowly to reduce the risk of dizzy or fainting spells. Drinking alcohol with this medication can increase the risk of these side effects. This medication may increase your risk of getting an infection. Call your care team for advice if you get a fever, chills, sore throat, or other symptoms of a cold or flu. Do not treat yourself. Try to avoid being around people who are sick. Avoid taking medications that contain aspirin, acetaminophen, ibuprofen, naproxen, or ketoprofen unless instructed by your care team. These medications may hide a fever. This medication may increase your risk to bruise or  bleed. Call your care team if you notice any unusual bleeding. Be careful brushing or flossing your teeth or using a toothpick because you may get an infection or bleed more easily. If you have any dental work done, tell your dentist you are receiving this medication. Talk to your care team if you or your partner are pregnant or think either of you might be pregnant. This medication can cause serious birth defects if taken during pregnancy and for 6 months after the last dose. You will need a negative pregnancy test before starting this medication.  Contraception is recommended while taking this medication and for 6 months after the last dose. Your care team can help you find the option that works for you. Do not father a child while taking this medication and for 3 months after the last dose. Use a condom for contraception during this time period. Do not breastfeed while taking this medication and for 7 days after the last dose. This medication may cause infertility. Talk to your care team if you are concerned about your fertility. What side effects may I notice from receiving this medication? Side effects that you should report to your care team as soon as possible: Allergic reactions--skin rash, itching, hives, swelling of the face, lips, tongue, or throat Dry cough, shortness of breath or trouble breathing Increased saliva or tears, increased sweating, stomach cramping, diarrhea, small pupils, unusual weakness or fatigue, slow heartbeat Infection--fever, chills, cough, sore throat, wounds that don't heal, pain or trouble when passing urine, general feeling of discomfort or being unwell Kidney injury--decrease in the amount of urine, swelling of the ankles, hands, or feet Low red blood cell level--unusual weakness or fatigue, dizziness, headache, trouble breathing Severe or prolonged diarrhea Unusual bruising or bleeding Side effects that usually do not require medical attention (report to your care team if they continue or are bothersome): Constipation Diarrhea Hair loss Loss of appetite Nausea Stomach pain This list may not describe all possible side effects. Call your doctor for medical advice about side effects. You may report side effects to FDA at 1-800-FDA-1088. Where should I keep my medication? This medication is given in a hospital or clinic. It will not be stored at home. NOTE: This sheet is a summary. It may not cover all possible information. If you have questions about this medicine, talk to your doctor, pharmacist, or  health care provider.  2023 Elsevier/Gold Standard (2021-09-25 00:00:00) Bevacizumab Injection What is this medication? BEVACIZUMAB (be va SIZ yoo mab) treats some types of cancer. It works by blocking a protein that causes cancer cells to grow and multiply. This helps to slow or stop the spread of cancer cells. It is a monoclonal antibody. This medicine may be used for other purposes; ask your health care provider or pharmacist if you have questions. COMMON BRAND NAME(S): Alymsys, Avastin, MVASI, Noah Charon What should I tell my care team before I take this medication? They need to know if you have any of these conditions: Blood clots Coughing up blood Having or recent surgery Heart failure High blood pressure History of a connection between 2 or more body parts that do not usually connect (fistula) History of a tear in your stomach or intestines Protein in your urine An unusual or allergic reaction to bevacizumab, other medications, foods, dyes, or preservatives Pregnant or trying to get pregnant Breast-feeding How should I use this medication? This medication is injected into a vein. It is given by your care team in a hospital or  clinic setting. Talk to your care team the use of this medication in children. Special care may be needed. Overdosage: If you think you have taken too much of this medicine contact a poison control center or emergency room at once. NOTE: This medicine is only for you. Do not share this medicine with others. What if I miss a dose? Keep appointments for follow-up doses. It is important not to miss your dose. Call your care team if you are unable to keep an appointment. What may interact with this medication? Interactions are not expected. This list may not describe all possible interactions. Give your health care provider a list of all the medicines, herbs, non-prescription drugs, or dietary supplements you use. Also tell them if you smoke, drink alcohol, or use  illegal drugs. Some items may interact with your medicine. What should I watch for while using this medication? Your condition will be monitored carefully while you are receiving this medication. You may need blood work while taking this medication. This medication may make you feel generally unwell. This is not uncommon as chemotherapy can affect healthy cells as well as cancer cells. Report any side effects. Continue your course of treatment even though you feel ill unless your care team tells you to stop. This medication may increase your risk to bruise or bleed. Call your care team if you notice any unusual bleeding. Before having surgery, talk to your care team to make sure it is ok. This medication can increase the risk of poor healing of your surgical site or wound. You will need to stop this medication for 28 days before surgery. After surgery, wait at least 28 days before restarting this medication. Make sure the surgical site or wound is healed enough before restarting this medication. Talk to your care team if questions. Talk to your care team if you may be pregnant. Serious birth defects can occur if you take this medication during pregnancy and for 6 months after the last dose. Contraception is recommended while taking this medication and for 6 months after the last dose. Your care team can help you find the option that works for you. Do not breastfeed while taking this medication and for 6 months after the last dose. This medication can cause infertility. Talk to your care team if you are concerned about your fertility. What side effects may I notice from receiving this medication? Side effects that you should report to your care team as soon as possible: Allergic reactions--skin rash, itching, hives, swelling of the face, lips, tongue, or throat Bleeding--bloody or black, tar-like stools, vomiting blood or brown material that looks like coffee grounds, red or dark brown urine, small red or  purple spots on skin, unusual bruising or bleeding Blood clot--pain, swelling, or warmth in the leg, shortness of breath, chest pain Heart attack--pain or tightness in the chest, shoulders, arms, or jaw, nausea, shortness of breath, cold or clammy skin, feeling faint or lightheaded Heart failure--shortness of breath, swelling of the ankles, feet, or hands, sudden weight gain, unusual weakness or fatigue Increase in blood pressure Infection--fever, chills, cough, sore throat, wounds that don't heal, pain or trouble when passing urine, general feeling of discomfort or being unwell Infusion reactions--chest pain, shortness of breath or trouble breathing, feeling faint or lightheaded Kidney injury--decrease in the amount of urine, swelling of the ankles, hands, or feet Stomach pain that is severe, does not go away, or gets worse Stroke--sudden numbness or weakness of the face, arm, or leg, trouble  speaking, confusion, trouble walking, loss of balance or coordination, dizziness, severe headache, change in vision Sudden and severe headache, confusion, change in vision, seizures, which may be signs of posterior reversible encephalopathy syndrome (PRES) Side effects that usually do not require medical attention (report to your care team if they continue or are bothersome): Back pain Change in taste Diarrhea Dry skin Increased tears Nosebleed This list may not describe all possible side effects. Call your doctor for medical advice about side effects. You may report side effects to FDA at 1-800-FDA-1088. Where should I keep my medication? This medication is given in a hospital or clinic. It will not be stored at home. NOTE: This sheet is a summary. It may not cover all possible information. If you have questions about this medicine, talk to your doctor, pharmacist, or health care provider.  2023 Elsevier/Gold Standard (2021-09-30 00:00:00)       To help prevent nausea and vomiting after your  treatment, we encourage you to take your nausea medication as directed.  BELOW ARE SYMPTOMS THAT SHOULD BE REPORTED IMMEDIATELY: *FEVER GREATER THAN 100.4 F (38 C) OR HIGHER *CHILLS OR SWEATING *NAUSEA AND VOMITING THAT IS NOT CONTROLLED WITH YOUR NAUSEA MEDICATION *UNUSUAL SHORTNESS OF BREATH *UNUSUAL BRUISING OR BLEEDING *URINARY PROBLEMS (pain or burning when urinating, or frequent urination) *BOWEL PROBLEMS (unusual diarrhea, constipation, pain near the anus) TENDERNESS IN MOUTH AND THROAT WITH OR WITHOUT PRESENCE OF ULCERS (sore throat, sores in mouth, or a toothache) UNUSUAL RASH, SWELLING OR PAIN  UNUSUAL VAGINAL DISCHARGE OR ITCHING   Items with * indicate a potential emergency and should be followed up as soon as possible or go to the Emergency Department if any problems should occur.  The chemotherapy medication bag should finish at 46 hours.. For example, if your pump is scheduled for 46 hours and it was put on at 4:00 p.m., it should finish at 2:00 p.m. the day it is scheduled to come off regardless of your appointment time.     Estimated time to finish at 1PM.   If the display on your pump reads "Low Volume" and it is beeping, take the batteries out of the pump and come to the cancer center for it to be taken off.   If the pump alarms go off prior to the pump reading "Low Volume" then call [phone] and someone can assist you.  If the plunger comes out and the chemotherapy medication is leaking out, please use your home chemo spill kit to clean up the spill. Do NOT use paper towels or other household products.  If you have problems or questions regarding your pump, please call either [phone] (24 hours a day) or the cancer center Monday-Friday 8:00 a.m.- 4:30 p.m. at the clinic number and we will assist you. If you are unable to get assistance, then go to the nearest Emergency Department and ask the staff to contact the IV team for assistance.    Please show the  CHEMOTHERAPY ALERT CARD or IMMUNOTHERAPY ALERT CARD at check-in to the Emergency Department and triage nurse.  Should you have questions after your visit or need to cancel or reschedule your appointment, please contact [facility]  Dept: [phone]  and follow the prompts.  Office hours are 8:00 a.m. to 4:30 p.m. Monday - Friday. Please note that voicemails left after 4:00 p.m. may not be returned until the following business day.  We are closed weekends and major holidays. You have access to a nurse  at all times for urgent questions. Please call the main number to the clinic Dept: [phone] and follow the prompts.  For any non-urgent questions, you may also contact your provider using MyChart. We now offer e-Visits for anyone 74 and older to request care online for non-urgent symptoms. For details visit [email or web address].   Also download the MyChart app! Go to the app store, search "MyChart", open the app, select [facility], and log in with your MyChart username and password.  Masks are optional in the cancer centers. If you would like for your care team to wear a mask while they are taking care of you, please let them know. You may have one support person who is at least 54 years old accompany you for your appointments.

## 2022-01-16 ENCOUNTER — Other Ambulatory Visit: Payer: Self-pay | Admitting: Psychiatry

## 2022-01-16 ENCOUNTER — Inpatient Hospital Stay: Payer: 59

## 2022-01-16 VITALS — BP 125/68 | HR 88 | Temp 98.8°F | Resp 20

## 2022-01-16 DIAGNOSIS — C787 Secondary malignant neoplasm of liver and intrahepatic bile duct: Secondary | ICD-10-CM

## 2022-01-16 DIAGNOSIS — F422 Mixed obsessional thoughts and acts: Secondary | ICD-10-CM

## 2022-01-16 DIAGNOSIS — C189 Malignant neoplasm of colon, unspecified: Secondary | ICD-10-CM

## 2022-01-16 DIAGNOSIS — C7800 Secondary malignant neoplasm of unspecified lung: Secondary | ICD-10-CM

## 2022-01-16 DIAGNOSIS — C182 Malignant neoplasm of ascending colon: Secondary | ICD-10-CM

## 2022-01-16 DIAGNOSIS — Z5111 Encounter for antineoplastic chemotherapy: Secondary | ICD-10-CM | POA: Diagnosis not present

## 2022-01-16 DIAGNOSIS — F333 Major depressive disorder, recurrent, severe with psychotic symptoms: Secondary | ICD-10-CM

## 2022-01-16 MED ORDER — HEPARIN SOD (PORK) LOCK FLUSH 100 UNIT/ML IV SOLN
500.0000 [IU] | Freq: Once | INTRAVENOUS | Status: AC | PRN
  Administered 2022-01-16: 500 [IU]

## 2022-01-16 MED ORDER — SODIUM CHLORIDE 0.9% FLUSH
10.0000 mL | INTRAVENOUS | Status: DC | PRN
  Administered 2022-01-16: 10 mL

## 2022-01-16 NOTE — Patient Instructions (Signed)

## 2022-01-21 ENCOUNTER — Inpatient Hospital Stay: Payer: 59

## 2022-01-21 VITALS — BP 131/72 | HR 96 | Temp 97.5°F | Resp 20 | Wt 170.1 lb

## 2022-01-21 DIAGNOSIS — Z5111 Encounter for antineoplastic chemotherapy: Secondary | ICD-10-CM | POA: Diagnosis not present

## 2022-01-21 DIAGNOSIS — C182 Malignant neoplasm of ascending colon: Secondary | ICD-10-CM

## 2022-01-21 DIAGNOSIS — C189 Malignant neoplasm of colon, unspecified: Secondary | ICD-10-CM

## 2022-01-21 MED ORDER — FILGRASTIM-SNDZ 480 MCG/0.8ML IJ SOSY
480.0000 ug | PREFILLED_SYRINGE | Freq: Once | INTRAMUSCULAR | Status: AC
  Administered 2022-01-21: 480 ug via SUBCUTANEOUS
  Filled 2022-01-21: qty 0.8

## 2022-01-21 NOTE — Patient Instructions (Signed)
Filgrastim Injection What is this medication? FILGRASTIM (fil GRA stim) lowers the risk of infection in people who are receiving chemotherapy. It works by helping your body make more white blood cells, which protects your body from infection. It may also be used to help people who have been exposed to high doses of radiation. It can be used to help prepare your body before a stem cell transplant. It works by helping your bone marrow make and release stem cells into the blood. This medicine may be used for other purposes; ask your health care provider or pharmacist if you have questions. COMMON BRAND NAME(S): Neupogen, Nivestym, Releuko, Zarxio What should I tell my care team before I take this medication? They need to know if you have any of these conditions: History of blood diseases, such as sickle cell anemia Kidney disease Recent or ongoing radiation An unusual or allergic reaction to filgrastim, pegfilgrastim, latex, rubber, other medications, foods, dyes, or preservatives Pregnant or trying to get pregnant Breast-feeding How should I use this medication? This medication is injected under the skin or into a vein. It is usually given by your care team in a hospital or clinic setting. It may be given at home. If you get this medication at home, you will be taught how to prepare and give it. Use exactly as directed. Take it as directed on the prescription label at the same time every day. Keep taking it unless your care team tells you to stop. It is important that you put your used needles and syringes in a special sharps container. Do not put them in a trash can. If you do not have a sharps container, call your pharmacist or care team to get one. This medication comes with INSTRUCTIONS FOR USE. Ask your pharmacist for directions on how to use this medication. Read the information carefully. Talk to your pharmacist or care team if you have questions. Talk to your care team about the use of this  medication in children. While it may be prescribed for children for selected conditions, precautions do apply. Overdosage: If you think you have taken too much of this medicine contact a poison control center or emergency room at once. NOTE: This medicine is only for you. Do not share this medicine with others. What if I miss a dose? It is important not to miss any doses. Talk to your care team about what to do if you miss a dose. What may interact with this medication? Medications that may cause a release of neutrophils, such as lithium This list may not describe all possible interactions. Give your health care provider a list of all the medicines, herbs, non-prescription drugs, or dietary supplements you use. Also tell them if you smoke, drink alcohol, or use illegal drugs. Some items may interact with your medicine. What should I watch for while using this medication? Your condition will be monitored carefully while you are receiving this medication. You may need bloodwork while taking this medication. Talk to your care team about your risk of cancer. You may be more at risk for certain types of cancer if you take this medication. What side effects may I notice from receiving this medication? Side effects that you should report to your care team as soon as possible: Allergic reactions--skin rash, itching, hives, swelling of the face, lips, tongue, or throat Capillary leak syndrome--stomach or muscle pain, unusual weakness or fatigue, feeling faint or lightheaded, decrease in the amount of urine, swelling of the ankles, hands, or   feet, trouble breathing High white blood cell level--fever, fatigue, trouble breathing, night sweats, change in vision, weight loss Inflammation of the aorta--fever, fatigue, back, chest, or stomach pain, severe headache Kidney injury (glomerulonephritis)--decrease in the amount of urine, red or dark brown urine, foamy or bubbly urine, swelling of the ankles, hands, or  feet Shortness of breath or trouble breathing Spleen injury--pain in upper left stomach or shoulder Unusual bruising or bleeding Side effects that usually do not require medical attention (report to your care team if they continue or are bothersome): Back pain Bone pain Fatigue Fever Headache Nausea This list may not describe all possible side effects. Call your doctor for medical advice about side effects. You may report side effects to FDA at 1-800-FDA-1088. Where should I keep my medication? Keep out of the reach of children and pets. Keep this medication in the original packaging until you are ready to take it. Protect from light. See product for storage information. Each product may have different instructions. Get rid of any unused medication after the expiration date. To get rid of medications that are no longer needed or have expired: Take the medication to a medications take-back program. Check with your pharmacy or law enforcement to find a location. If you cannot return the medication, ask your pharmacist or care team how to get rid of this medication safely. NOTE: This sheet is a summary. It may not cover all possible information. If you have questions about this medicine, talk to your doctor, pharmacist, or health care provider.  2023 Elsevier/Gold Standard (2021-08-26 00:00:00)  

## 2022-01-22 ENCOUNTER — Inpatient Hospital Stay: Payer: 59

## 2022-01-22 ENCOUNTER — Other Ambulatory Visit: Payer: Self-pay | Admitting: Psychiatry

## 2022-01-22 VITALS — BP 123/77 | HR 97 | Temp 98.2°F | Resp 16 | Wt 170.1 lb

## 2022-01-22 DIAGNOSIS — Z5111 Encounter for antineoplastic chemotherapy: Secondary | ICD-10-CM | POA: Diagnosis not present

## 2022-01-22 DIAGNOSIS — C182 Malignant neoplasm of ascending colon: Secondary | ICD-10-CM

## 2022-01-22 DIAGNOSIS — C189 Malignant neoplasm of colon, unspecified: Secondary | ICD-10-CM

## 2022-01-22 DIAGNOSIS — F333 Major depressive disorder, recurrent, severe with psychotic symptoms: Secondary | ICD-10-CM

## 2022-01-22 MED ORDER — FILGRASTIM-SNDZ 480 MCG/0.8ML IJ SOSY
480.0000 ug | PREFILLED_SYRINGE | Freq: Once | INTRAMUSCULAR | Status: AC
  Administered 2022-01-22: 480 ug via SUBCUTANEOUS

## 2022-01-23 ENCOUNTER — Inpatient Hospital Stay: Payer: 59

## 2022-01-23 VITALS — BP 135/82 | HR 87 | Temp 98.1°F | Resp 18 | Ht 68.0 in | Wt 173.0 lb

## 2022-01-23 DIAGNOSIS — C182 Malignant neoplasm of ascending colon: Secondary | ICD-10-CM

## 2022-01-23 DIAGNOSIS — C189 Malignant neoplasm of colon, unspecified: Secondary | ICD-10-CM

## 2022-01-23 DIAGNOSIS — Z5111 Encounter for antineoplastic chemotherapy: Secondary | ICD-10-CM | POA: Diagnosis not present

## 2022-01-23 MED ORDER — FILGRASTIM-SNDZ 480 MCG/0.8ML IJ SOSY
480.0000 ug | PREFILLED_SYRINGE | Freq: Once | INTRAMUSCULAR | Status: AC
  Administered 2022-01-23: 480 ug via SUBCUTANEOUS
  Filled 2022-01-23: qty 0.8

## 2022-01-23 NOTE — Patient Instructions (Signed)
Filgrastim Injection What is this medication? FILGRASTIM (fil GRA stim) lowers the risk of infection in people who are receiving chemotherapy. It works by helping your body make more white blood cells, which protects your body from infection. It may also be used to help people who have been exposed to high doses of radiation. It can be used to help prepare your body before a stem cell transplant. It works by helping your bone marrow make and release stem cells into the blood. This medicine may be used for other purposes; ask your health care provider or pharmacist if you have questions. COMMON BRAND NAME(S): Neupogen, Nivestym, Releuko, Zarxio What should I tell my care team before I take this medication? They need to know if you have any of these conditions: History of blood diseases, such as sickle cell anemia Kidney disease Recent or ongoing radiation An unusual or allergic reaction to filgrastim, pegfilgrastim, latex, rubber, other medications, foods, dyes, or preservatives Pregnant or trying to get pregnant Breast-feeding How should I use this medication? This medication is injected under the skin or into a vein. It is usually given by your care team in a hospital or clinic setting. It may be given at home. If you get this medication at home, you will be taught how to prepare and give it. Use exactly as directed. Take it as directed on the prescription label at the same time every day. Keep taking it unless your care team tells you to stop. It is important that you put your used needles and syringes in a special sharps container. Do not put them in a trash can. If you do not have a sharps container, call your pharmacist or care team to get one. This medication comes with INSTRUCTIONS FOR USE. Ask your pharmacist for directions on how to use this medication. Read the information carefully. Talk to your pharmacist or care team if you have questions. Talk to your care team about the use of this  medication in children. While it may be prescribed for children for selected conditions, precautions do apply. Overdosage: If you think you have taken too much of this medicine contact a poison control center or emergency room at once. NOTE: This medicine is only for you. Do not share this medicine with others. What if I miss a dose? It is important not to miss any doses. Talk to your care team about what to do if you miss a dose. What may interact with this medication? Medications that may cause a release of neutrophils, such as lithium This list may not describe all possible interactions. Give your health care provider a list of all the medicines, herbs, non-prescription drugs, or dietary supplements you use. Also tell them if you smoke, drink alcohol, or use illegal drugs. Some items may interact with your medicine. What should I watch for while using this medication? Your condition will be monitored carefully while you are receiving this medication. You may need bloodwork while taking this medication. Talk to your care team about your risk of cancer. You may be more at risk for certain types of cancer if you take this medication. What side effects may I notice from receiving this medication? Side effects that you should report to your care team as soon as possible: Allergic reactions--skin rash, itching, hives, swelling of the face, lips, tongue, or throat Capillary leak syndrome--stomach or muscle pain, unusual weakness or fatigue, feeling faint or lightheaded, decrease in the amount of urine, swelling of the ankles, hands, or   feet, trouble breathing High white blood cell level--fever, fatigue, trouble breathing, night sweats, change in vision, weight loss Inflammation of the aorta--fever, fatigue, back, chest, or stomach pain, severe headache Kidney injury (glomerulonephritis)--decrease in the amount of urine, red or dark brown urine, foamy or bubbly urine, swelling of the ankles, hands, or  feet Shortness of breath or trouble breathing Spleen injury--pain in upper left stomach or shoulder Unusual bruising or bleeding Side effects that usually do not require medical attention (report to your care team if they continue or are bothersome): Back pain Bone pain Fatigue Fever Headache Nausea This list may not describe all possible side effects. Call your doctor for medical advice about side effects. You may report side effects to FDA at 1-800-FDA-1088. Where should I keep my medication? Keep out of the reach of children and pets. Keep this medication in the original packaging until you are ready to take it. Protect from light. See product for storage information. Each product may have different instructions. Get rid of any unused medication after the expiration date. To get rid of medications that are no longer needed or have expired: Take the medication to a medications take-back program. Check with your pharmacy or law enforcement to find a location. If you cannot return the medication, ask your pharmacist or care team how to get rid of this medication safely. NOTE: This sheet is a summary. It may not cover all possible information. If you have questions about this medicine, talk to your doctor, pharmacist, or health care provider.  2023 Elsevier/Gold Standard (2021-08-26 00:00:00)  

## 2022-01-25 ENCOUNTER — Other Ambulatory Visit: Payer: Self-pay | Admitting: Psychiatry

## 2022-01-25 DIAGNOSIS — F422 Mixed obsessional thoughts and acts: Secondary | ICD-10-CM

## 2022-01-25 DIAGNOSIS — F333 Major depressive disorder, recurrent, severe with psychotic symptoms: Secondary | ICD-10-CM

## 2022-01-28 NOTE — Telephone Encounter (Signed)
Patient has new insurance.

## 2022-01-29 NOTE — Telephone Encounter (Signed)
Noted will submit a new PA

## 2022-01-29 NOTE — Progress Notes (Unsigned)
Shannon [name]  [address] [place],  [place]  [address] [phone]  Clinic Day:  01/30/2022  Referring physician: Greig Right, MD   HISTORY OF PRESENT ILLNESS:  The patient is a 56 y.o. female with  metastatic colon cancer, which includes spread of disease to her abdominal cavity, lungs, brain and right adrenal gland. She comes in today prior to her 34th cycle of palliative FOLFIRI/Avastin chemotherapy.  Of note, leucovorin has been discontinued due to an allergic reaction from this agent that did not improve despite being predmedicated with diphenhydramine and famotidine. The patient claims to have tolerated her 33rd cycle of treatment fairly well.  She continues to report intermittent diarrhea, but is using Imodium as needed.  She denies her GI symptoms are getting worse to where she is concerned about disease progression.   With respect to her colon cancer history, she is status post a right hemicolectomy in early October 2018, followed by 12 cycles of adjuvant FOLFOX chemotherapy, which were completed in April 2019.  In December 2021, she underwent a left cerebellar metastasectomy, whose pathology was consistent with metastatic colon cancer.  Tumor testing did come back MMR normal.  CT scans revealed evidence of her cancer being in multiple locations, for which she has been taking FOLFIRI/Avastin ever since.  PHYSICAL EXAM:  Blood pressure (!) 141/84, pulse 91, temperature 98.7 F (37.1 C), resp. rate 16, height 5' 8"  (1.727 m), weight 175 lb 6.4 oz (79.6 kg), last menstrual period 02/22/2019, SpO2 96 %.  Body mass index is 26.67 kg/m.  Performance status (ECOG): 1 - Symptomatic but completely ambulatory  Physical Exam Vitals and nursing note reviewed.  Constitutional:      General: She is not in acute distress.    Appearance: Normal appearance.  HENT:     Head: Normocephalic and atraumatic.     Mouth/Throat:     Mouth: Mucous membranes are  moist.     Pharynx: Oropharynx is clear. No oropharyngeal exudate or posterior oropharyngeal erythema.  Eyes:     General: No scleral icterus.    Extraocular Movements: Extraocular movements intact.     Conjunctiva/sclera: Conjunctivae normal.     Pupils: Pupils are equal, round, and reactive to light.  Cardiovascular:     Rate and Rhythm: Normal rate and regular rhythm.     Heart sounds: Normal heart sounds. No murmur heard.    No friction rub. No gallop.  Pulmonary:     Effort: Pulmonary effort is normal.     Breath sounds: Normal breath sounds. No wheezing, rhonchi or rales.  Abdominal:     General: There is no distension.     Palpations: Abdomen is soft. There is no hepatomegaly, splenomegaly or mass.     Tenderness: There is no abdominal tenderness.  Musculoskeletal:        General: Normal range of motion.     Cervical back: Normal range of motion and neck supple. No tenderness.     Right lower leg: No edema.     Left lower leg: No edema.  Lymphadenopathy:     Cervical: No cervical adenopathy.     Upper Body:     Right upper body: No supraclavicular or axillary adenopathy.     Left upper body: No supraclavicular or axillary adenopathy.     Lower Body: No right inguinal adenopathy. No left inguinal adenopathy.  Skin:    General: Skin is warm and dry.     Coloration: Skin is not  jaundiced.     Findings: No rash.  Neurological:     Mental Status: She is alert and oriented to person, place, and time.     Cranial Nerves: No cranial nerve deficit.  Psychiatric:        Mood and Affect: Mood normal.        Behavior: Behavior normal.        Thought Content: Thought content normal.    LABS:      Latest Ref Rng & Units 01/30/2022   12:00 AM 01/12/2022   12:00 AM 12/22/2021   12:00 AM  CBC  WBC  3.7     3.7     6.1      Hemoglobin 12.0 - 16.0 12.9     12.5     14.3      Hematocrit 36 - 46 40     38     44      Platelets 150 - 400 K/uL 99     101     97         This result  is from an external source.      Latest Ref Rng & Units 01/30/2022   12:00 AM 01/12/2022   12:00 AM 12/22/2021   12:00 AM  CMP  BUN 4 - 21 10     13     14       Creatinine 0.5 - 1.1 1.2     1.2     1.2      Sodium 137 - 147 141     139     136      Potassium 3.5 - 5.1 mEq/L 4.2     4.2     4.4      Chloride 99 - 108 104     103     102      CO2 13 - 22 32     29     23      Calcium 8.7 - 10.7 9.2     8.9     9.5      Alkaline Phos 25 - 125 130     129     155      AST 13 - 35 27     27     28       ALT 7 - 35 U/L 26     23     25          This result is from an external source.    ASSESSMENT & PLAN:  Assessment/Plan:  A 56 y.o. female with metastatic colon cancer.  She will proceed with her 34th cycle of FOLFIRI/Avastin (without leucovorin) next week.  She will continue to receive this chemotherapy regimen once every 3 weeks.  Clinically, she appears to be doing well.  I will see her back in 3 weeks before she heads into her 35th cycle of FOLFIRI/Avastin.  The patient understands all the plans discussed today and is in agreement with them.    Lauran Romanski Macarthur Critchley, MD

## 2022-01-30 ENCOUNTER — Inpatient Hospital Stay: Payer: 59 | Attending: Oncology

## 2022-01-30 ENCOUNTER — Inpatient Hospital Stay (HOSPITAL_BASED_OUTPATIENT_CLINIC_OR_DEPARTMENT_OTHER): Payer: 59 | Admitting: Oncology

## 2022-01-30 ENCOUNTER — Other Ambulatory Visit: Payer: Self-pay | Admitting: Psychiatry

## 2022-01-30 ENCOUNTER — Encounter: Payer: Self-pay | Admitting: Oncology

## 2022-01-30 VITALS — BP 141/84 | HR 91 | Temp 98.7°F | Resp 16 | Ht 68.0 in | Wt 175.4 lb

## 2022-01-30 DIAGNOSIS — C7971 Secondary malignant neoplasm of right adrenal gland: Secondary | ICD-10-CM | POA: Insufficient documentation

## 2022-01-30 DIAGNOSIS — C189 Malignant neoplasm of colon, unspecified: Secondary | ICD-10-CM

## 2022-01-30 DIAGNOSIS — C7800 Secondary malignant neoplasm of unspecified lung: Secondary | ICD-10-CM

## 2022-01-30 DIAGNOSIS — Z5111 Encounter for antineoplastic chemotherapy: Secondary | ICD-10-CM | POA: Insufficient documentation

## 2022-01-30 DIAGNOSIS — C7802 Secondary malignant neoplasm of left lung: Secondary | ICD-10-CM | POA: Insufficient documentation

## 2022-01-30 DIAGNOSIS — C7801 Secondary malignant neoplasm of right lung: Secondary | ICD-10-CM | POA: Insufficient documentation

## 2022-01-30 DIAGNOSIS — C7989 Secondary malignant neoplasm of other specified sites: Secondary | ICD-10-CM | POA: Insufficient documentation

## 2022-01-30 DIAGNOSIS — F333 Major depressive disorder, recurrent, severe with psychotic symptoms: Secondary | ICD-10-CM

## 2022-01-30 DIAGNOSIS — Z5189 Encounter for other specified aftercare: Secondary | ICD-10-CM | POA: Insufficient documentation

## 2022-01-30 DIAGNOSIS — C182 Malignant neoplasm of ascending colon: Secondary | ICD-10-CM

## 2022-01-30 DIAGNOSIS — F422 Mixed obsessional thoughts and acts: Secondary | ICD-10-CM

## 2022-01-30 DIAGNOSIS — C7931 Secondary malignant neoplasm of brain: Secondary | ICD-10-CM | POA: Insufficient documentation

## 2022-01-30 DIAGNOSIS — C787 Secondary malignant neoplasm of liver and intrahepatic bile duct: Secondary | ICD-10-CM

## 2022-01-30 LAB — CBC AND DIFFERENTIAL
HCT: 40 (ref 36–46)
Hemoglobin: 12.9 (ref 12.0–16.0)
Neutrophils Absolute: 2.18
Platelets: 99 10*3/uL — AB (ref 150–400)
WBC: 3.7

## 2022-01-30 LAB — BASIC METABOLIC PANEL
BUN: 10 (ref 4–21)
CO2: 32 — AB (ref 13–22)
Chloride: 104 (ref 99–108)
Creatinine: 1.2 — AB (ref 0.5–1.1)
Glucose: 97
Potassium: 4.2 mEq/L (ref 3.5–5.1)
Sodium: 141 (ref 137–147)

## 2022-01-30 LAB — HEPATIC FUNCTION PANEL
ALT: 26 U/L (ref 7–35)
AST: 27 (ref 13–35)
Alkaline Phosphatase: 130 — AB (ref 25–125)
Bilirubin, Total: 0.4

## 2022-01-30 LAB — CBC: RBC: 4.1 (ref 3.87–5.11)

## 2022-01-30 LAB — COMPREHENSIVE METABOLIC PANEL
Albumin: 4.1 (ref 3.5–5.0)
Calcium: 9.2 (ref 8.7–10.7)

## 2022-01-30 MED FILL — Irinotecan HCl Inj 100 MG/5ML (20 MG/ML): INTRAVENOUS | Qty: 14 | Status: AC

## 2022-01-30 MED FILL — Fluorouracil IV Soln 5 GM/100ML (50 MG/ML): INTRAVENOUS | Qty: 100 | Status: AC

## 2022-01-30 MED FILL — Dexamethasone Sodium Phosphate Inj 100 MG/10ML: INTRAMUSCULAR | Qty: 1 | Status: AC

## 2022-01-30 MED FILL — Fosaprepitant Dimeglumine For IV Infusion 150 MG (Base Eq): INTRAVENOUS | Qty: 5 | Status: AC

## 2022-01-30 MED FILL — Bevacizumab-awwb IV Soln 400 MG/16ML (For Infusion): INTRAVENOUS | Qty: 16 | Status: AC

## 2022-02-02 ENCOUNTER — Encounter: Payer: Self-pay | Admitting: Oncology

## 2022-02-03 ENCOUNTER — Inpatient Hospital Stay: Payer: 59

## 2022-02-03 VITALS — BP 133/83 | HR 103 | Temp 97.8°F | Resp 18 | Ht 68.0 in | Wt 170.0 lb

## 2022-02-03 DIAGNOSIS — C182 Malignant neoplasm of ascending colon: Secondary | ICD-10-CM

## 2022-02-03 DIAGNOSIS — C7802 Secondary malignant neoplasm of left lung: Secondary | ICD-10-CM | POA: Diagnosis not present

## 2022-02-03 DIAGNOSIS — C7931 Secondary malignant neoplasm of brain: Secondary | ICD-10-CM | POA: Diagnosis not present

## 2022-02-03 DIAGNOSIS — C7801 Secondary malignant neoplasm of right lung: Secondary | ICD-10-CM | POA: Diagnosis not present

## 2022-02-03 DIAGNOSIS — C189 Malignant neoplasm of colon, unspecified: Secondary | ICD-10-CM | POA: Diagnosis not present

## 2022-02-03 DIAGNOSIS — Z5189 Encounter for other specified aftercare: Secondary | ICD-10-CM | POA: Diagnosis not present

## 2022-02-03 DIAGNOSIS — C7971 Secondary malignant neoplasm of right adrenal gland: Secondary | ICD-10-CM | POA: Diagnosis not present

## 2022-02-03 DIAGNOSIS — C7989 Secondary malignant neoplasm of other specified sites: Secondary | ICD-10-CM | POA: Diagnosis not present

## 2022-02-03 DIAGNOSIS — Z5111 Encounter for antineoplastic chemotherapy: Secondary | ICD-10-CM | POA: Diagnosis not present

## 2022-02-03 MED ORDER — SODIUM CHLORIDE 0.9 % IV SOLN
10.0000 mg | Freq: Once | INTRAVENOUS | Status: AC
  Administered 2022-02-03: 10 mg via INTRAVENOUS
  Filled 2022-02-03: qty 10

## 2022-02-03 MED ORDER — DIPHENHYDRAMINE HCL 50 MG/ML IJ SOLN
25.0000 mg | Freq: Once | INTRAMUSCULAR | Status: AC
  Administered 2022-02-03: 25 mg via INTRAVENOUS
  Filled 2022-02-03: qty 1

## 2022-02-03 MED ORDER — IRINOTECAN HCL CHEMO INJECTION 100 MG/5ML
144.0000 mg/m2 | Freq: Once | INTRAVENOUS | Status: AC
  Administered 2022-02-03: 280 mg via INTRAVENOUS
  Filled 2022-02-03: qty 10

## 2022-02-03 MED ORDER — SODIUM CHLORIDE 0.9 % IV SOLN
150.0000 mg | Freq: Once | INTRAVENOUS | Status: AC
  Administered 2022-02-03: 150 mg via INTRAVENOUS
  Filled 2022-02-03: qty 150

## 2022-02-03 MED ORDER — FLUOROURACIL CHEMO INJECTION 2.5 GM/50ML
400.0000 mg/m2 | Freq: Once | INTRAVENOUS | Status: AC
  Administered 2022-02-03: 800 mg via INTRAVENOUS
  Filled 2022-02-03: qty 12

## 2022-02-03 MED ORDER — SODIUM CHLORIDE 0.9 % IV SOLN: Freq: Once | INTRAVENOUS | Status: AC

## 2022-02-03 MED ORDER — SODIUM CHLORIDE 0.9 % IV SOLN
2575.0000 mg/m2 | INTRAVENOUS | Status: DC
  Administered 2022-02-03: 5000 mg via INTRAVENOUS
  Filled 2022-02-03: qty 100

## 2022-02-03 MED ORDER — SODIUM CHLORIDE 0.9 % IV SOLN
5.0000 mg/kg | Freq: Once | INTRAVENOUS | Status: AC
  Administered 2022-02-03: 400 mg via INTRAVENOUS
  Filled 2022-02-03: qty 16

## 2022-02-03 MED ORDER — PALONOSETRON HCL INJECTION 0.25 MG/5ML
0.2500 mg | Freq: Once | INTRAVENOUS | Status: AC
  Administered 2022-02-03: 0.25 mg via INTRAVENOUS
  Filled 2022-02-03: qty 5

## 2022-02-03 MED ORDER — FAMOTIDINE IN NACL 20-0.9 MG/50ML-% IV SOLN
20.0000 mg | Freq: Once | INTRAVENOUS | Status: AC
  Administered 2022-02-03: 20 mg via INTRAVENOUS
  Filled 2022-02-03: qty 50

## 2022-02-03 NOTE — Patient Instructions (Signed)
Fluorouracil Injection What is this medication? FLUOROURACIL (flure oh YOOR a sil) treats some types of cancer. It works by slowing down the growth of cancer cells. This medicine may be used for other purposes; ask your health care provider or pharmacist if you have questions. COMMON BRAND NAME(S): Adrucil What should I tell my care team before I take this medication? They need to know if you have any of these conditions: Blood disorders Dihydropyrimidine dehydrogenase (DPD) deficiency Infection, such as chickenpox, cold sores, herpes Kidney disease Liver disease Poor nutrition Recent or ongoing radiation therapy An unusual or allergic reaction to fluorouracil, other medications, foods, dyes, or preservatives If you or your partner are pregnant or trying to get pregnant Breast-feeding How should I use this medication? This medication is injected into a vein. It is administered by your care team in a hospital or clinic setting. Talk to your care team about the use of this medication in children. Special care may be needed. Overdosage: If you think you have taken too much of this medicine contact a poison control center or emergency room at once. NOTE: This medicine is only for you. Do not share this medicine with others. What if I miss a dose? Keep appointments for follow-up doses. It is important not to miss your dose. Call your care team if you are unable to keep an appointment. What may interact with this medication? Do not take this medication with any of the following: Live virus vaccines This medication may also interact with the following: Medications that treat or prevent blood clots, such as warfarin, enoxaparin, dalteparin This list may not describe all possible interactions. Give your health care provider a list of all the medicines, herbs, non-prescription drugs, or dietary supplements you use. Also tell them if you smoke, drink alcohol, or use illegal drugs. Some items may  interact with your medicine. What should I watch for while using this medication? Your condition will be monitored carefully while you are receiving this medication. This medication may make you feel generally unwell. This is not uncommon as chemotherapy can affect healthy cells as well as cancer cells. Report any side effects. Continue your course of treatment even though you feel ill unless your care team tells you to stop. In some cases, you may be given additional medications to help with side effects. Follow all directions for their use. This medication may increase your risk of getting an infection. Call your care team for advice if you get a fever, chills, sore throat, or other symptoms of a cold or flu. Do not treat yourself. Try to avoid being around people who are sick. This medication may increase your risk to bruise or bleed. Call your care team if you notice any unusual bleeding. Be careful brushing or flossing your teeth or using a toothpick because you may get an infection or bleed more easily. If you have any dental work done, tell your dentist you are receiving this medication. Avoid taking medications that contain aspirin, acetaminophen, ibuprofen, naproxen, or ketoprofen unless instructed by your care team. These medications may hide a fever. Do not treat diarrhea with over the counter products. Contact your care team if you have diarrhea that lasts more than 2 days or if it is severe and watery. This medication can make you more sensitive to the sun. Keep out of the sun. If you cannot avoid being in the sun, wear protective clothing and sunscreen. Do not use sun lamps, tanning beds, or tanning booths. Talk to   your care team if you or your partner wish to become pregnant or think you might be pregnant. This medication can cause serious birth defects if taken during pregnancy and for 3 months after the last dose. A reliable form of contraception is recommended while taking this  medication and for 3 months after the last dose. Talk to your care team about effective forms of contraception. Do not father a child while taking this medication and for 3 months after the last dose. Use a condom while having sex during this time period. Do not breastfeed while taking this medication. This medication may cause infertility. Talk to your care team if you are concerned about your fertility. What side effects may I notice from receiving this medication? Side effects that you should report to your care team as soon as possible: Allergic reactions--skin rash, itching, hives, swelling of the face, lips, tongue, or throat Heart attack--pain or tightness in the chest, shoulders, arms, or jaw, nausea, shortness of breath, cold or clammy skin, feeling faint or lightheaded Heart failure--shortness of breath, swelling of the ankles, feet, or hands, sudden weight gain, unusual weakness or fatigue Heart rhythm changes--fast or irregular heartbeat, dizziness, feeling faint or lightheaded, chest pain, trouble breathing High ammonia level--unusual weakness or fatigue, confusion, loss of appetite, nausea, vomiting, seizures Infection--fever, chills, cough, sore throat, wounds that don't heal, pain or trouble when passing urine, general feeling of discomfort or being unwell Low red blood cell level--unusual weakness or fatigue, dizziness, headache, trouble breathing Pain, tingling, or numbness in the hands or feet, muscle weakness, change in vision, confusion or trouble speaking, loss of balance or coordination, trouble walking, seizures Redness, swelling, and blistering of the skin over hands and feet Severe or prolonged diarrhea Unusual bruising or bleeding Side effects that usually do not require medical attention (report to your care team if they continue or are bothersome): Dry skin Headache Increased tears Nausea Pain, redness, or swelling with sores inside the mouth or throat Sensitivity  to light Vomiting This list may not describe all possible side effects. Call your doctor for medical advice about side effects. You may report side effects to FDA at 1-800-FDA-1088. Where should I keep my medication? This medication is given in a hospital or clinic. It will not be stored at home. NOTE: This sheet is a summary. It may not cover all possible information. If you have questions about this medicine, talk to your doctor, pharmacist, or health care provider.  2023 Elsevier/Gold Standard (2021-09-23 00:00:00) Bevacizumab Injection What is this medication? BEVACIZUMAB (be va SIZ yoo mab) treats some types of cancer. It works by blocking a protein that causes cancer cells to grow and multiply. This helps to slow or stop the spread of cancer cells. It is a monoclonal antibody. This medicine may be used for other purposes; ask your health care provider or pharmacist if you have questions. COMMON BRAND NAME(S): Alymsys, Avastin, MVASI, Noah Charon What should I tell my care team before I take this medication? They need to know if you have any of these conditions: Blood clots Coughing up blood Having or recent surgery Heart failure High blood pressure History of a connection between 2 or more body parts that do not usually connect (fistula) History of a tear in your stomach or intestines Protein in your urine An unusual or allergic reaction to bevacizumab, other medications, foods, dyes, or preservatives Pregnant or trying to get pregnant Breast-feeding How should I use this medication? This medication is injected into a  vein. It is given by your care team in a hospital or clinic setting. Talk to your care team the use of this medication in children. Special care may be needed. Overdosage: If you think you have taken too much of this medicine contact a poison control center or emergency room at once. NOTE: This medicine is only for you. Do not share this medicine with others. What if I  miss a dose? Keep appointments for follow-up doses. It is important not to miss your dose. Call your care team if you are unable to keep an appointment. What may interact with this medication? Interactions are not expected. This list may not describe all possible interactions. Give your health care provider a list of all the medicines, herbs, non-prescription drugs, or dietary supplements you use. Also tell them if you smoke, drink alcohol, or use illegal drugs. Some items may interact with your medicine. What should I watch for while using this medication? Your condition will be monitored carefully while you are receiving this medication. You may need blood work while taking this medication. This medication may make you feel generally unwell. This is not uncommon as chemotherapy can affect healthy cells as well as cancer cells. Report any side effects. Continue your course of treatment even though you feel ill unless your care team tells you to stop. This medication may increase your risk to bruise or bleed. Call your care team if you notice any unusual bleeding. Before having surgery, talk to your care team to make sure it is ok. This medication can increase the risk of poor healing of your surgical site or wound. You will need to stop this medication for 28 days before surgery. After surgery, wait at least 28 days before restarting this medication. Make sure the surgical site or wound is healed enough before restarting this medication. Talk to your care team if questions. Talk to your care team if you may be pregnant. Serious birth defects can occur if you take this medication during pregnancy and for 6 months after the last dose. Contraception is recommended while taking this medication and for 6 months after the last dose. Your care team can help you find the option that works for you. Do not breastfeed while taking this medication and for 6 months after the last dose. This medication can cause  infertility. Talk to your care team if you are concerned about your fertility. What side effects may I notice from receiving this medication? Side effects that you should report to your care team as soon as possible: Allergic reactions--skin rash, itching, hives, swelling of the face, lips, tongue, or throat Bleeding--bloody or black, tar-like stools, vomiting blood or brown material that looks like coffee grounds, red or dark brown urine, small red or purple spots on skin, unusual bruising or bleeding Blood clot--pain, swelling, or warmth in the leg, shortness of breath, chest pain Heart attack--pain or tightness in the chest, shoulders, arms, or jaw, nausea, shortness of breath, cold or clammy skin, feeling faint or lightheaded Heart failure--shortness of breath, swelling of the ankles, feet, or hands, sudden weight gain, unusual weakness or fatigue Increase in blood pressure Infection--fever, chills, cough, sore throat, wounds that don't heal, pain or trouble when passing urine, general feeling of discomfort or being unwell Infusion reactions--chest pain, shortness of breath or trouble breathing, feeling faint or lightheaded Kidney injury--decrease in the amount of urine, swelling of the ankles, hands, or feet Stomach pain that is severe, does not go away, or gets  worse Stroke--sudden numbness or weakness of the face, arm, or leg, trouble speaking, confusion, trouble walking, loss of balance or coordination, dizziness, severe headache, change in vision Sudden and severe headache, confusion, change in vision, seizures, which may be signs of posterior reversible encephalopathy syndrome (PRES) Side effects that usually do not require medical attention (report to your care team if they continue or are bothersome): Back pain Change in taste Diarrhea Dry skin Increased tears Nosebleed This list may not describe all possible side effects. Call your doctor for medical advice about side effects. You  may report side effects to FDA at 1-800-FDA-1088. Where should I keep my medication? This medication is given in a hospital or clinic. It will not be stored at home. NOTE: This sheet is a summary. It may not cover all possible information. If you have questions about this medicine, talk to your doctor, pharmacist, or health care provider.  2023 Elsevier/Gold Standard (2021-09-30 00:00:00) Irinotecan Injection What is this medication? IRINOTECAN (ir in oh TEE kan) treats some types of cancer. It works by slowing down the growth of cancer cells. This medicine may be used for other purposes; ask your health care provider or pharmacist if you have questions. COMMON BRAND NAME(S): Camptosar What should I tell my care team before I take this medication? They need to know if you have any of these conditions: Dehydration Diarrhea Infection, especially a viral infection, such as chickenpox, cold sores, herpes Liver disease Low blood cell levels (white cells, red cells, and platelets) Low levels of electrolytes, such as calcium, magnesium, or potassium in your blood Recent or ongoing radiation An unusual or allergic reaction to irinotecan, other medications, foods, dyes, or preservatives If you or your partner are pregnant or trying to get pregnant Breast-feeding How should I use this medication? This medication is injected into a vein. It is given by your care team in a hospital or clinic setting. Talk to your care team about the use of this medication in children. Special care may be needed. Overdosage: If you think you have taken too much of this medicine contact a poison control center or emergency room at once. NOTE: This medicine is only for you. Do not share this medicine with others. What if I miss a dose? Keep appointments for follow-up doses. It is important not to miss your dose. Call your care team if you are unable to keep an appointment. What may interact with this medication? Do  not take this medication with any of the following: Cobicistat Itraconazole This medication may also interact with the following: Certain antibiotics, such as clarithromycin, rifampin, rifabutin Certain antivirals for HIV or AIDS Certain medications for fungal infections, such as ketoconazole, posaconazole, voriconazole Certain medications for seizures, such as carbamazepine, phenobarbital, phenytoin Gemfibrozil Nefazodone St. John's wort This list may not describe all possible interactions. Give your health care provider a list of all the medicines, herbs, non-prescription drugs, or dietary supplements you use. Also tell them if you smoke, drink alcohol, or use illegal drugs. Some items may interact with your medicine. What should I watch for while using this medication? Your condition will be monitored carefully while you are receiving this medication. You may need blood work while taking this medication. This medication may make you feel generally unwell. This is not uncommon as chemotherapy can affect healthy cells as well as cancer cells. Report any side effects. Continue your course of treatment even though you feel ill unless your care team tells you to stop. This  medication can cause serious side effects. To reduce the risk, your care team may give you other medications to take before receiving this one. Be sure to follow the directions from your care team. This medication may affect your coordination, reaction time, or judgement. Do not drive or operate machinery until you know how this medication affects you. Sit up or stand slowly to reduce the risk of dizzy or fainting spells. Drinking alcohol with this medication can increase the risk of these side effects. This medication may increase your risk of getting an infection. Call your care team for advice if you get a fever, chills, sore throat, or other symptoms of a cold or flu. Do not treat yourself. Try to avoid being around people who  are sick. Avoid taking medications that contain aspirin, acetaminophen, ibuprofen, naproxen, or ketoprofen unless instructed by your care team. These medications may hide a fever. This medication may increase your risk to bruise or bleed. Call your care team if you notice any unusual bleeding. Be careful brushing or flossing your teeth or using a toothpick because you may get an infection or bleed more easily. If you have any dental work done, tell your dentist you are receiving this medication. Talk to your care team if you or your partner are pregnant or think either of you might be pregnant. This medication can cause serious birth defects if taken during pregnancy and for 6 months after the last dose. You will need a negative pregnancy test before starting this medication. Contraception is recommended while taking this medication and for 6 months after the last dose. Your care team can help you find the option that works for you. Do not father a child while taking this medication and for 3 months after the last dose. Use a condom for contraception during this time period. Do not breastfeed while taking this medication and for 7 days after the last dose. This medication may cause infertility. Talk to your care team if you are concerned about your fertility. What side effects may I notice from receiving this medication? Side effects that you should report to your care team as soon as possible: Allergic reactions--skin rash, itching, hives, swelling of the face, lips, tongue, or throat Dry cough, shortness of breath or trouble breathing Increased saliva or tears, increased sweating, stomach cramping, diarrhea, small pupils, unusual weakness or fatigue, slow heartbeat Infection--fever, chills, cough, sore throat, wounds that don't heal, pain or trouble when passing urine, general feeling of discomfort or being unwell Kidney injury--decrease in the amount of urine, swelling of the ankles, hands, or  feet Low red blood cell level--unusual weakness or fatigue, dizziness, headache, trouble breathing Severe or prolonged diarrhea Unusual bruising or bleeding Side effects that usually do not require medical attention (report to your care team if they continue or are bothersome): Constipation Diarrhea Hair loss Loss of appetite Nausea Stomach pain This list may not describe all possible side effects. Call your doctor for medical advice about side effects. You may report side effects to FDA at 1-800-FDA-1088. Where should I keep my medication? This medication is given in a hospital or clinic. It will not be stored at home. NOTE: This sheet is a summary. It may not cover all possible information. If you have questions about this medicine, talk to your doctor, pharmacist, or health care provider.  2023 Elsevier/Gold Standard (2021-09-25 00:00:00)

## 2022-02-04 DIAGNOSIS — C182 Malignant neoplasm of ascending colon: Secondary | ICD-10-CM | POA: Diagnosis not present

## 2022-02-05 ENCOUNTER — Inpatient Hospital Stay: Payer: 59

## 2022-02-05 VITALS — BP 112/63 | HR 99 | Temp 98.1°F | Resp 18 | Ht 68.0 in | Wt 172.0 lb

## 2022-02-05 DIAGNOSIS — C189 Malignant neoplasm of colon, unspecified: Secondary | ICD-10-CM | POA: Diagnosis not present

## 2022-02-05 DIAGNOSIS — C7931 Secondary malignant neoplasm of brain: Secondary | ICD-10-CM | POA: Diagnosis not present

## 2022-02-05 DIAGNOSIS — C7802 Secondary malignant neoplasm of left lung: Secondary | ICD-10-CM | POA: Diagnosis not present

## 2022-02-05 DIAGNOSIS — C7989 Secondary malignant neoplasm of other specified sites: Secondary | ICD-10-CM | POA: Diagnosis not present

## 2022-02-05 DIAGNOSIS — C7971 Secondary malignant neoplasm of right adrenal gland: Secondary | ICD-10-CM | POA: Diagnosis not present

## 2022-02-05 DIAGNOSIS — Z5189 Encounter for other specified aftercare: Secondary | ICD-10-CM | POA: Diagnosis not present

## 2022-02-05 DIAGNOSIS — C182 Malignant neoplasm of ascending colon: Secondary | ICD-10-CM

## 2022-02-05 DIAGNOSIS — Z5111 Encounter for antineoplastic chemotherapy: Secondary | ICD-10-CM | POA: Diagnosis not present

## 2022-02-05 DIAGNOSIS — C7801 Secondary malignant neoplasm of right lung: Secondary | ICD-10-CM | POA: Diagnosis not present

## 2022-02-05 MED ORDER — SODIUM CHLORIDE 0.9% FLUSH
10.0000 mL | INTRAVENOUS | Status: DC | PRN
  Administered 2022-02-05: 10 mL

## 2022-02-05 MED ORDER — HEPARIN SOD (PORK) LOCK FLUSH 100 UNIT/ML IV SOLN
500.0000 [IU] | Freq: Once | INTRAVENOUS | Status: AC | PRN
  Administered 2022-02-05: 500 [IU]

## 2022-02-10 ENCOUNTER — Inpatient Hospital Stay: Payer: 59

## 2022-02-10 VITALS — BP 118/62 | HR 88 | Temp 98.2°F | Resp 18

## 2022-02-10 DIAGNOSIS — C189 Malignant neoplasm of colon, unspecified: Secondary | ICD-10-CM | POA: Diagnosis not present

## 2022-02-10 DIAGNOSIS — Z5111 Encounter for antineoplastic chemotherapy: Secondary | ICD-10-CM | POA: Diagnosis not present

## 2022-02-10 DIAGNOSIS — Z5189 Encounter for other specified aftercare: Secondary | ICD-10-CM | POA: Diagnosis not present

## 2022-02-10 DIAGNOSIS — C7971 Secondary malignant neoplasm of right adrenal gland: Secondary | ICD-10-CM | POA: Diagnosis not present

## 2022-02-10 DIAGNOSIS — C182 Malignant neoplasm of ascending colon: Secondary | ICD-10-CM

## 2022-02-10 DIAGNOSIS — C7802 Secondary malignant neoplasm of left lung: Secondary | ICD-10-CM | POA: Diagnosis not present

## 2022-02-10 DIAGNOSIS — C7801 Secondary malignant neoplasm of right lung: Secondary | ICD-10-CM | POA: Diagnosis not present

## 2022-02-10 DIAGNOSIS — C7931 Secondary malignant neoplasm of brain: Secondary | ICD-10-CM | POA: Diagnosis not present

## 2022-02-10 DIAGNOSIS — C7989 Secondary malignant neoplasm of other specified sites: Secondary | ICD-10-CM | POA: Diagnosis not present

## 2022-02-10 MED ORDER — FILGRASTIM-SNDZ 480 MCG/0.8ML IJ SOSY
480.0000 ug | PREFILLED_SYRINGE | Freq: Once | INTRAMUSCULAR | Status: AC
  Administered 2022-02-10: 480 ug via SUBCUTANEOUS

## 2022-02-11 ENCOUNTER — Inpatient Hospital Stay: Payer: 59

## 2022-02-11 VITALS — BP 115/75 | HR 93 | Temp 97.8°F | Resp 18 | Wt 166.0 lb

## 2022-02-11 DIAGNOSIS — C7989 Secondary malignant neoplasm of other specified sites: Secondary | ICD-10-CM | POA: Diagnosis not present

## 2022-02-11 DIAGNOSIS — C7801 Secondary malignant neoplasm of right lung: Secondary | ICD-10-CM | POA: Diagnosis not present

## 2022-02-11 DIAGNOSIS — C189 Malignant neoplasm of colon, unspecified: Secondary | ICD-10-CM

## 2022-02-11 DIAGNOSIS — Z5189 Encounter for other specified aftercare: Secondary | ICD-10-CM | POA: Diagnosis not present

## 2022-02-11 DIAGNOSIS — C7802 Secondary malignant neoplasm of left lung: Secondary | ICD-10-CM | POA: Diagnosis not present

## 2022-02-11 DIAGNOSIS — C182 Malignant neoplasm of ascending colon: Secondary | ICD-10-CM

## 2022-02-11 DIAGNOSIS — C7971 Secondary malignant neoplasm of right adrenal gland: Secondary | ICD-10-CM | POA: Diagnosis not present

## 2022-02-11 DIAGNOSIS — Z5111 Encounter for antineoplastic chemotherapy: Secondary | ICD-10-CM | POA: Diagnosis not present

## 2022-02-11 DIAGNOSIS — C7931 Secondary malignant neoplasm of brain: Secondary | ICD-10-CM | POA: Diagnosis not present

## 2022-02-11 MED ORDER — FILGRASTIM-SNDZ 480 MCG/0.8ML IJ SOSY
480.0000 ug | PREFILLED_SYRINGE | Freq: Once | INTRAMUSCULAR | Status: AC
  Administered 2022-02-11: 480 ug via SUBCUTANEOUS
  Filled 2022-02-11: qty 0.8

## 2022-02-11 NOTE — Patient Instructions (Signed)

## 2022-02-12 ENCOUNTER — Inpatient Hospital Stay: Payer: 59

## 2022-02-12 VITALS — BP 111/54 | HR 90 | Temp 97.5°F | Resp 18

## 2022-02-12 DIAGNOSIS — C189 Malignant neoplasm of colon, unspecified: Secondary | ICD-10-CM

## 2022-02-12 DIAGNOSIS — C7989 Secondary malignant neoplasm of other specified sites: Secondary | ICD-10-CM | POA: Diagnosis not present

## 2022-02-12 DIAGNOSIS — C7931 Secondary malignant neoplasm of brain: Secondary | ICD-10-CM | POA: Diagnosis not present

## 2022-02-12 DIAGNOSIS — C7801 Secondary malignant neoplasm of right lung: Secondary | ICD-10-CM | POA: Diagnosis not present

## 2022-02-12 DIAGNOSIS — Z5189 Encounter for other specified aftercare: Secondary | ICD-10-CM | POA: Diagnosis not present

## 2022-02-12 DIAGNOSIS — C7800 Secondary malignant neoplasm of unspecified lung: Secondary | ICD-10-CM

## 2022-02-12 DIAGNOSIS — C7802 Secondary malignant neoplasm of left lung: Secondary | ICD-10-CM | POA: Diagnosis not present

## 2022-02-12 DIAGNOSIS — C7971 Secondary malignant neoplasm of right adrenal gland: Secondary | ICD-10-CM | POA: Diagnosis not present

## 2022-02-12 DIAGNOSIS — Z5111 Encounter for antineoplastic chemotherapy: Secondary | ICD-10-CM | POA: Diagnosis not present

## 2022-02-12 DIAGNOSIS — C182 Malignant neoplasm of ascending colon: Secondary | ICD-10-CM

## 2022-02-12 MED ORDER — FILGRASTIM-SNDZ 480 MCG/0.8ML IJ SOSY
480.0000 ug | PREFILLED_SYRINGE | Freq: Once | INTRAMUSCULAR | Status: AC
  Administered 2022-02-12: 480 ug via SUBCUTANEOUS
  Filled 2022-02-12: qty 0.8

## 2022-02-16 ENCOUNTER — Telehealth: Payer: Self-pay

## 2022-02-16 ENCOUNTER — Other Ambulatory Visit: Payer: Self-pay

## 2022-02-16 DIAGNOSIS — F333 Major depressive disorder, recurrent, severe with psychotic symptoms: Secondary | ICD-10-CM

## 2022-02-16 DIAGNOSIS — F422 Mixed obsessional thoughts and acts: Secondary | ICD-10-CM

## 2022-02-16 MED ORDER — CARIPRAZINE HCL 3 MG PO CAPS: 3.0000 mg | ORAL_CAPSULE | Freq: Every day | ORAL | 1 refills | Status: DC

## 2022-02-16 NOTE — Telephone Encounter (Signed)
Prior Authorization submitted and approved for VRAYLAR 3 MG effective 02/16/2022-02/17/2023 with [employer].

## 2022-02-16 NOTE — Telephone Encounter (Signed)
LVM to [facility] 

## 2022-02-19 NOTE — Progress Notes (Unsigned)
[facility]  [address] [place],  [place]  [address] [phone]  Clinic Day:  02/20/2022  Referring physician: Marice Potter, MD   HISTORY OF PRESENT ILLNESS:  The patient is a 56 y.o. female with  metastatic colon cancer, which includes spread of disease to her abdominal cavity, lungs, brain and right adrenal gland. She comes in today prior to her 35th cycle of palliative FOLFIRI/Avastin chemotherapy.  Of note, leucovorin has been discontinued due to an allergic reaction from this agent that did not improve despite being predmedicated with diphenhydramine and famotidine. The patient claims to have tolerated her 34th cycle of treatment okay, but did complain of slightly more nausea than what she has had previously.  Her oral antiemetics eventually helped curtailing her degree of nausea..  She continues to report intermittent diarrhea, but is using Imodium as needed.  She denies her GI symptoms are getting worse to where she is concerned about disease progression.   With respect to her colon cancer history, she is status post a right hemicolectomy in early October 2018, followed by 12 cycles of adjuvant FOLFOX chemotherapy, which were completed in April 2019.  In December 2021, she underwent a left cerebellar metastasectomy, whose pathology was consistent with metastatic colon cancer.  Tumor testing did come back MMR normal.  CT scans revealed evidence of her cancer being in multiple locations, for which she has been taking FOLFIRI/Avastin ever since.  PHYSICAL EXAM:  Blood pressure 131/80, pulse 96, temperature 98.6 F (37 C), resp. rate 16, height 5' 8"  (1.727 m), weight 173 lb (78.5 kg), last menstrual period 02/22/2019, SpO2 97 %.  Body mass index is 26.3 kg/m.  Performance status (ECOG): 1 - Symptomatic but completely ambulatory  Physical Exam Vitals and nursing note reviewed.  Constitutional:      General: She is not in acute distress.     Appearance: Normal appearance.  HENT:     Head: Normocephalic and atraumatic.     Mouth/Throat:     Mouth: Mucous membranes are moist.     Pharynx: Oropharynx is clear. No oropharyngeal exudate or posterior oropharyngeal erythema.  Eyes:     General: No scleral icterus.    Extraocular Movements: Extraocular movements intact.     Conjunctiva/sclera: Conjunctivae normal.     Pupils: Pupils are equal, round, and reactive to light.  Cardiovascular:     Rate and Rhythm: Normal rate and regular rhythm.     Heart sounds: Normal heart sounds. No murmur heard.    No friction rub. No gallop.  Pulmonary:     Effort: Pulmonary effort is normal.     Breath sounds: Normal breath sounds. No wheezing, rhonchi or rales.  Abdominal:     General: There is no distension.     Palpations: Abdomen is soft. There is no hepatomegaly, splenomegaly or mass.     Tenderness: There is no abdominal tenderness.  Musculoskeletal:        General: Normal range of motion.     Cervical back: Normal range of motion and neck supple. No tenderness.     Right lower leg: No edema.     Left lower leg: No edema.  Lymphadenopathy:     Cervical: No cervical adenopathy.     Upper Body:     Right upper body: No supraclavicular or axillary adenopathy.     Left upper body: No supraclavicular or axillary adenopathy.     Lower Body: No right inguinal adenopathy. No left  inguinal adenopathy.  Skin:    General: Skin is warm and dry.     Coloration: Skin is not jaundiced.     Findings: No rash.  Neurological:     Mental Status: She is alert and oriented to person, place, and time.     Cranial Nerves: No cranial nerve deficit.  Psychiatric:        Mood and Affect: Mood normal.        Behavior: Behavior normal.        Thought Content: Thought content normal.    LABS:      Latest Ref Rng & Units 02/20/2022   12:00 AM 01/30/2022   12:00 AM 01/12/2022   12:00 AM  CBC  WBC  3.0     3.7     3.7      Hemoglobin 12.0 - 16.0 12.3      12.9     12.5      Hematocrit 36 - 46 37     40     38      Platelets 150 - 400 K/uL 88     99     101         This result is from an external source.      Latest Ref Rng & Units 02/20/2022   12:00 AM 01/30/2022   12:00 AM 01/12/2022   12:00 AM  CMP  BUN 4 - 21 7     10     13       Creatinine 0.5 - 1.1 1.2     1.2     1.2      Sodium 137 - 147 142     141     139      Potassium 3.5 - 5.1 mEq/L 3.7     4.2     4.2      Chloride 99 - 108 106     104     103      CO2 13 - 22 28     32     29      Calcium 8.7 - 10.7 8.8     9.2     8.9      Alkaline Phos 25 - 125 110     130     129      AST 13 - 35 30     27     27       ALT 7 - 35 U/L 28     26     23          This result is from an external source.    ASSESSMENT & PLAN:  Assessment/Plan:  A 56 y.o. female with metastatic colon cancer.  She will proceed with her 35th cycle of FOLFIRI/Avastin (without leucovorin) next week.  She will continue to receive this chemotherapy regimen once every 3 weeks.  As her white count is somewhat low, white cell shot therapy will be given with this cycle of treatment to prevent neutropenia from delaying her next cycle of treatment.  Clinically, she appears to be doing well.  I will see her back in 3 weeks before she heads into her 36th cycle of FOLFIRI/Avastin.  CT scans will be done a day before her next visit to ascertain her new disease baseline after 35 cycles of FOLFIRI/Avastin.  The patient understands all the plans discussed today and is in agreement with them.    Taha Dimond Macarthur Critchley,  MD

## 2022-02-20 ENCOUNTER — Other Ambulatory Visit: Payer: Self-pay | Admitting: Oncology

## 2022-02-20 ENCOUNTER — Inpatient Hospital Stay (HOSPITAL_BASED_OUTPATIENT_CLINIC_OR_DEPARTMENT_OTHER): Payer: 59 | Admitting: Oncology

## 2022-02-20 ENCOUNTER — Inpatient Hospital Stay: Payer: 59

## 2022-02-20 ENCOUNTER — Encounter: Payer: Self-pay | Admitting: Oncology

## 2022-02-20 DIAGNOSIS — C182 Malignant neoplasm of ascending colon: Secondary | ICD-10-CM

## 2022-02-20 DIAGNOSIS — C189 Malignant neoplasm of colon, unspecified: Secondary | ICD-10-CM

## 2022-02-20 DIAGNOSIS — C787 Secondary malignant neoplasm of liver and intrahepatic bile duct: Secondary | ICD-10-CM

## 2022-02-20 DIAGNOSIS — C7800 Secondary malignant neoplasm of unspecified lung: Secondary | ICD-10-CM

## 2022-02-20 LAB — BASIC METABOLIC PANEL
BUN: 7 (ref 4–21)
CO2: 28 — AB (ref 13–22)
Chloride: 106 (ref 99–108)
Creatinine: 1.2 — AB (ref 0.5–1.1)
Glucose: 113
Potassium: 3.7 mEq/L (ref 3.5–5.1)
Sodium: 142 (ref 137–147)

## 2022-02-20 LAB — CBC AND DIFFERENTIAL
HCT: 37 (ref 36–46)
Hemoglobin: 12.3 (ref 12.0–16.0)
Neutrophils Absolute: 1.89
Platelets: 88 10*3/uL — AB (ref 150–400)
WBC: 3

## 2022-02-20 LAB — HEPATIC FUNCTION PANEL
ALT: 28 U/L (ref 7–35)
AST: 30 (ref 13–35)
Alkaline Phosphatase: 110 (ref 25–125)
Bilirubin, Total: 0.4

## 2022-02-20 LAB — COMPREHENSIVE METABOLIC PANEL
Albumin: 3.9 (ref 3.5–5.0)
Calcium: 8.8 (ref 8.7–10.7)

## 2022-02-20 LAB — CBC: RBC: 3.82 — AB (ref 3.87–5.11)

## 2022-02-22 ENCOUNTER — Encounter: Payer: Self-pay | Admitting: Oncology

## 2022-02-23 MED FILL — Fosaprepitant Dimeglumine For IV Infusion 150 MG (Base Eq): INTRAVENOUS | Qty: 5 | Status: AC

## 2022-02-23 MED FILL — Fluorouracil IV Soln 2.5 GM/50ML (50 MG/ML): INTRAVENOUS | Qty: 16 | Status: AC

## 2022-02-23 MED FILL — Irinotecan HCl Inj 100 MG/5ML (20 MG/ML): INTRAVENOUS | Qty: 14 | Status: AC

## 2022-02-23 MED FILL — Fluorouracil IV Soln 5 GM/100ML (50 MG/ML): INTRAVENOUS | Qty: 100 | Status: AC

## 2022-02-23 MED FILL — Dexamethasone Sodium Phosphate Inj 100 MG/10ML: INTRAMUSCULAR | Qty: 1 | Status: AC

## 2022-02-23 MED FILL — Bevacizumab-awwb IV Soln 400 MG/16ML (For Infusion): INTRAVENOUS | Qty: 16 | Status: AC

## 2022-02-23 NOTE — Progress Notes (Signed)
Enrolled patient into co-pay assistance for [employer] through [employer].  ID# [id number]

## 2022-02-24 ENCOUNTER — Other Ambulatory Visit: Payer: Self-pay

## 2022-02-24 ENCOUNTER — Inpatient Hospital Stay: Payer: 59

## 2022-02-24 VITALS — BP 141/80 | HR 111 | Temp 98.4°F | Resp 18 | Ht 68.0 in | Wt 173.1 lb

## 2022-02-24 DIAGNOSIS — C7802 Secondary malignant neoplasm of left lung: Secondary | ICD-10-CM | POA: Diagnosis not present

## 2022-02-24 DIAGNOSIS — C189 Malignant neoplasm of colon, unspecified: Secondary | ICD-10-CM | POA: Diagnosis not present

## 2022-02-24 DIAGNOSIS — C182 Malignant neoplasm of ascending colon: Secondary | ICD-10-CM

## 2022-02-24 DIAGNOSIS — Z5111 Encounter for antineoplastic chemotherapy: Secondary | ICD-10-CM | POA: Diagnosis not present

## 2022-02-24 DIAGNOSIS — C7989 Secondary malignant neoplasm of other specified sites: Secondary | ICD-10-CM | POA: Diagnosis not present

## 2022-02-24 DIAGNOSIS — Z5189 Encounter for other specified aftercare: Secondary | ICD-10-CM | POA: Diagnosis not present

## 2022-02-24 DIAGNOSIS — C7971 Secondary malignant neoplasm of right adrenal gland: Secondary | ICD-10-CM | POA: Diagnosis not present

## 2022-02-24 DIAGNOSIS — C7931 Secondary malignant neoplasm of brain: Secondary | ICD-10-CM | POA: Diagnosis not present

## 2022-02-24 DIAGNOSIS — C7801 Secondary malignant neoplasm of right lung: Secondary | ICD-10-CM | POA: Diagnosis not present

## 2022-02-24 MED ORDER — SODIUM CHLORIDE 0.9 % IV SOLN
150.0000 mg | Freq: Once | INTRAVENOUS | Status: AC
  Administered 2022-02-24: 150 mg via INTRAVENOUS
  Filled 2022-02-24: qty 150

## 2022-02-24 MED ORDER — SODIUM CHLORIDE 0.9 % IV SOLN
2575.0000 mg/m2 | INTRAVENOUS | Status: DC
  Administered 2022-02-24: 5000 mg via INTRAVENOUS
  Filled 2022-02-24: qty 100

## 2022-02-24 MED ORDER — SODIUM CHLORIDE 0.9 % IV SOLN: Freq: Once | INTRAVENOUS | Status: AC

## 2022-02-24 MED ORDER — IRINOTECAN HCL CHEMO INJECTION 100 MG/5ML
144.0000 mg/m2 | Freq: Once | INTRAVENOUS | Status: AC
  Administered 2022-02-24: 280 mg via INTRAVENOUS
  Filled 2022-02-24: qty 10

## 2022-02-24 MED ORDER — PALONOSETRON HCL INJECTION 0.25 MG/5ML
0.2500 mg | Freq: Once | INTRAVENOUS | Status: AC
  Administered 2022-02-24: 0.25 mg via INTRAVENOUS
  Filled 2022-02-24: qty 5

## 2022-02-24 MED ORDER — FAMOTIDINE IN NACL 20-0.9 MG/50ML-% IV SOLN
20.0000 mg | Freq: Once | INTRAVENOUS | Status: AC
  Administered 2022-02-24: 20 mg via INTRAVENOUS
  Filled 2022-02-24: qty 50

## 2022-02-24 MED ORDER — DIPHENHYDRAMINE HCL 50 MG/ML IJ SOLN
25.0000 mg | Freq: Once | INTRAMUSCULAR | Status: AC
  Administered 2022-02-24: 25 mg via INTRAVENOUS
  Filled 2022-02-24: qty 1

## 2022-02-24 MED ORDER — SODIUM CHLORIDE 0.9 % IV SOLN
5.0000 mg/kg | Freq: Once | INTRAVENOUS | Status: AC
  Administered 2022-02-24: 400 mg via INTRAVENOUS
  Filled 2022-02-24: qty 16

## 2022-02-24 MED ORDER — SODIUM CHLORIDE 0.9 % IV SOLN
10.0000 mg | Freq: Once | INTRAVENOUS | Status: AC
  Administered 2022-02-24: 10 mg via INTRAVENOUS
  Filled 2022-02-24: qty 10

## 2022-02-24 MED ORDER — FLUOROURACIL CHEMO INJECTION 2.5 GM/50ML
400.0000 mg/m2 | Freq: Once | INTRAVENOUS | Status: AC
  Administered 2022-02-24: 800 mg via INTRAVENOUS
  Filled 2022-02-24: qty 16

## 2022-02-24 MED ORDER — ATROPINE SULFATE 1 MG/ML IV SOLN
0.5000 mg | Freq: Once | INTRAVENOUS | Status: AC | PRN
  Administered 2022-02-24: 0.5 mg via INTRAVENOUS
  Filled 2022-02-24: qty 1

## 2022-02-24 NOTE — Patient Instructions (Signed)
[facility]  Discharge Instructions: Thank you for choosing [facility] to provide your oncology and hematology care.  If you have a lab appointment with the [facility], please go directly to the [facility] and check in at the registration area.   Wear comfortable clothing and clothing appropriate for easy access to any Portacath or PICC line.   We strive to give you quality time with your provider. You may need to reschedule your appointment if you arrive late (15 or more minutes).  Arriving late affects you and other patients whose appointments are after yours.  Also, if you miss three or more appointments without notifying the office, you may be dismissed from the clinic at the provider's discretion.      For prescription refill requests, have your pharmacy contact our office and allow 72 hours for refills to be completed.    Today you received the following chemotherapy and/or immunotherapy agents     To help prevent nausea and vomiting after your treatment, we encourage you to take your nausea medication as directed.  BELOW ARE SYMPTOMS THAT SHOULD BE REPORTED IMMEDIATELY: *FEVER GREATER THAN 100.4 F (38 C) OR HIGHER *CHILLS OR SWEATING *NAUSEA AND VOMITING THAT IS NOT CONTROLLED WITH YOUR NAUSEA MEDICATION *UNUSUAL SHORTNESS OF BREATH *UNUSUAL BRUISING OR BLEEDING *URINARY PROBLEMS (pain or burning when urinating, or frequent urination) *BOWEL PROBLEMS (unusual diarrhea, constipation, pain near the anus) TENDERNESS IN MOUTH AND THROAT WITH OR WITHOUT PRESENCE OF ULCERS (sore throat, sores in mouth, or a toothache) UNUSUAL RASH, SWELLING OR PAIN  UNUSUAL VAGINAL DISCHARGE OR ITCHING   Items with * indicate a potential emergency and should be followed up as soon as possible or go to the Emergency Department if any problems should occur.  Please show the CHEMOTHERAPY ALERT CARD or IMMUNOTHERAPY ALERT CARD at check-in to the Emergency  Department and triage nurse.  Should you have questions after your visit or need to cancel or reschedule your appointment, please contact [facility]  Dept: [phone]  and follow the prompts.  Office hours are 8:00 a.m. to 4:30 p.m. Monday - Friday. Please note that voicemails left after 4:00 p.m. may not be returned until the following business day.  We are closed weekends and major holidays. You have access to a nurse at all times for urgent questions. Please call the main number to the clinic Dept: [phone] and follow the prompts.  For any non-urgent questions, you may also contact your provider using MyChart. We now offer e-Visits for anyone 71 and older to request care online for non-urgent symptoms. For details visit [email or web address].   Also download the MyChart app! Go to the app store, search "MyChart", open the app, select [facility], and log in with your MyChart username and password.  Masks are optional in the cancer centers. If you would like for your care team to wear a mask while they are taking care of you, please let them know. You may have one support person who is at least 54 years old accompany you for your appointments. Irinotecan Injection What is this medication? IRINOTECAN (ir in oh TEE kan) treats some types of cancer. It works by slowing down the growth of cancer cells. This medicine may be used for other purposes; ask your health care provider or pharmacist if you have questions. COMMON BRAND NAME(S): Camptosar What should I tell my care team before I take this medication? They need to know  if you have any of these conditions: Dehydration Diarrhea Infection, especially a viral infection, such as chickenpox, cold sores, herpes Liver disease Low blood cell levels (white cells, red cells, and platelets) Low levels of electrolytes, such as calcium, magnesium, or potassium in your blood Recent or ongoing radiation An unusual or  allergic reaction to irinotecan, other medications, foods, dyes, or preservatives If you or your partner are pregnant or trying to get pregnant Breast-feeding How should I use this medication? This medication is injected into a vein. It is given by your care team in a hospital or clinic setting. Talk to your care team about the use of this medication in children. Special care may be needed. Overdosage: If you think you have taken too much of this medicine contact a poison control center or emergency room at once. NOTE: This medicine is only for you. Do not share this medicine with others. What if I miss a dose? Keep appointments for follow-up doses. It is important not to miss your dose. Call your care team if you are unable to keep an appointment. What may interact with this medication? Do not take this medication with any of the following: Cobicistat Itraconazole This medication may also interact with the following: Certain antibiotics, such as clarithromycin, rifampin, rifabutin Certain antivirals for HIV or AIDS Certain medications for fungal infections, such as ketoconazole, posaconazole, voriconazole Certain medications for seizures, such as carbamazepine, phenobarbital, phenytoin Gemfibrozil Nefazodone St. John's wort This list may not describe all possible interactions. Give your health care provider a list of all the medicines, herbs, non-prescription drugs, or dietary supplements you use. Also tell them if you smoke, drink alcohol, or use illegal drugs. Some items may interact with your medicine. What should I watch for while using this medication? Your condition will be monitored carefully while you are receiving this medication. You may need blood work while taking this medication. This medication may make you feel generally unwell. This is not uncommon as chemotherapy can affect healthy cells as well as cancer cells. Report any side effects. Continue your course of treatment  even though you feel ill unless your care team tells you to stop. This medication can cause serious side effects. To reduce the risk, your care team may give you other medications to take before receiving this one. Be sure to follow the directions from your care team. This medication may affect your coordination, reaction time, or judgement. Do not drive or operate machinery until you know how this medication affects you. Sit up or stand slowly to reduce the risk of dizzy or fainting spells. Drinking alcohol with this medication can increase the risk of these side effects. This medication may increase your risk of getting an infection. Call your care team for advice if you get a fever, chills, sore throat, or other symptoms of a cold or flu. Do not treat yourself. Try to avoid being around people who are sick. Avoid taking medications that contain aspirin, acetaminophen, ibuprofen, naproxen, or ketoprofen unless instructed by your care team. These medications may hide a fever. This medication may increase your risk to bruise or bleed. Call your care team if you notice any unusual bleeding. Be careful brushing or flossing your teeth or using a toothpick because you may get an infection or bleed more easily. If you have any dental work done, tell your dentist you are receiving this medication. Talk to your care team if you or your partner are pregnant or think either of you might  be pregnant. This medication can cause serious birth defects if taken during pregnancy and for 6 months after the last dose. You will need a negative pregnancy test before starting this medication. Contraception is recommended while taking this medication and for 6 months after the last dose. Your care team can help you find the option that works for you. Do not father a child while taking this medication and for 3 months after the last dose. Use a condom for contraception during this time period. Do not breastfeed while taking this  medication and for 7 days after the last dose. This medication may cause infertility. Talk to your care team if you are concerned about your fertility. What side effects may I notice from receiving this medication? Side effects that you should report to your care team as soon as possible: Allergic reactions--skin rash, itching, hives, swelling of the face, lips, tongue, or throat Dry cough, shortness of breath or trouble breathing Increased saliva or tears, increased sweating, stomach cramping, diarrhea, small pupils, unusual weakness or fatigue, slow heartbeat Infection--fever, chills, cough, sore throat, wounds that don't heal, pain or trouble when passing urine, general feeling of discomfort or being unwell Kidney injury--decrease in the amount of urine, swelling of the ankles, hands, or feet Low red blood cell level--unusual weakness or fatigue, dizziness, headache, trouble breathing Severe or prolonged diarrhea Unusual bruising or bleeding Side effects that usually do not require medical attention (report to your care team if they continue or are bothersome): Constipation Diarrhea Hair loss Loss of appetite Nausea Stomach pain This list may not describe all possible side effects. Call your doctor for medical advice about side effects. You may report side effects to FDA at 1-800-FDA-1088. Where should I keep my medication? This medication is given in a hospital or clinic. It will not be stored at home. NOTE: This sheet is a summary. It may not cover all possible information. If you have questions about this medicine, talk to your doctor, pharmacist, or health care provider.  2023 Elsevier/Gold Standard (2021-09-25 00:00:00) Fluorouracil Injection What is this medication? FLUOROURACIL (flure oh YOOR a sil) treats some types of cancer. It works by slowing down the growth of cancer cells. This medicine may be used for other purposes; ask your health care provider or pharmacist if you  have questions. COMMON BRAND NAME(S): Adrucil What should I tell my care team before I take this medication? They need to know if you have any of these conditions: Blood disorders Dihydropyrimidine dehydrogenase (DPD) deficiency Infection, such as chickenpox, cold sores, herpes Kidney disease Liver disease Poor nutrition Recent or ongoing radiation therapy An unusual or allergic reaction to fluorouracil, other medications, foods, dyes, or preservatives If you or your partner are pregnant or trying to get pregnant Breast-feeding How should I use this medication? This medication is injected into a vein. It is administered by your care team in a hospital or clinic setting. Talk to your care team about the use of this medication in children. Special care may be needed. Overdosage: If you think you have taken too much of this medicine contact a poison control center or emergency room at once. NOTE: This medicine is only for you. Do not share this medicine with others. What if I miss a dose? Keep appointments for follow-up doses. It is important not to miss your dose. Call your care team if you are unable to keep an appointment. What may interact with this medication? Do not take this medication with any of  the following: Live virus vaccines This medication may also interact with the following: Medications that treat or prevent blood clots, such as warfarin, enoxaparin, dalteparin This list may not describe all possible interactions. Give your health care provider a list of all the medicines, herbs, non-prescription drugs, or dietary supplements you use. Also tell them if you smoke, drink alcohol, or use illegal drugs. Some items may interact with your medicine. What should I watch for while using this medication? Your condition will be monitored carefully while you are receiving this medication. This medication may make you feel generally unwell. This is not uncommon as chemotherapy can  affect healthy cells as well as cancer cells. Report any side effects. Continue your course of treatment even though you feel ill unless your care team tells you to stop. In some cases, you may be given additional medications to help with side effects. Follow all directions for their use. This medication may increase your risk of getting an infection. Call your care team for advice if you get a fever, chills, sore throat, or other symptoms of a cold or flu. Do not treat yourself. Try to avoid being around people who are sick. This medication may increase your risk to bruise or bleed. Call your care team if you notice any unusual bleeding. Be careful brushing or flossing your teeth or using a toothpick because you may get an infection or bleed more easily. If you have any dental work done, tell your dentist you are receiving this medication. Avoid taking medications that contain aspirin, acetaminophen, ibuprofen, naproxen, or ketoprofen unless instructed by your care team. These medications may hide a fever. Do not treat diarrhea with over the counter products. Contact your care team if you have diarrhea that lasts more than 2 days or if it is severe and watery. This medication can make you more sensitive to the sun. Keep out of the sun. If you cannot avoid being in the sun, wear protective clothing and sunscreen. Do not use sun lamps, tanning beds, or tanning booths. Talk to your care team if you or your partner wish to become pregnant or think you might be pregnant. This medication can cause serious birth defects if taken during pregnancy and for 3 months after the last dose. A reliable form of contraception is recommended while taking this medication and for 3 months after the last dose. Talk to your care team about effective forms of contraception. Do not father a child while taking this medication and for 3 months after the last dose. Use a condom while having sex during this time period. Do not  breastfeed while taking this medication. This medication may cause infertility. Talk to your care team if you are concerned about your fertility. What side effects may I notice from receiving this medication? Side effects that you should report to your care team as soon as possible: Allergic reactions--skin rash, itching, hives, swelling of the face, lips, tongue, or throat Heart attack--pain or tightness in the chest, shoulders, arms, or jaw, nausea, shortness of breath, cold or clammy skin, feeling faint or lightheaded Heart failure--shortness of breath, swelling of the ankles, feet, or hands, sudden weight gain, unusual weakness or fatigue Heart rhythm changes--fast or irregular heartbeat, dizziness, feeling faint or lightheaded, chest pain, trouble breathing High ammonia level--unusual weakness or fatigue, confusion, loss of appetite, nausea, vomiting, seizures Infection--fever, chills, cough, sore throat, wounds that don't heal, pain or trouble when passing urine, general feeling of discomfort or being unwell Low red  blood cell level--unusual weakness or fatigue, dizziness, headache, trouble breathing Pain, tingling, or numbness in the hands or feet, muscle weakness, change in vision, confusion or trouble speaking, loss of balance or coordination, trouble walking, seizures Redness, swelling, and blistering of the skin over hands and feet Severe or prolonged diarrhea Unusual bruising or bleeding Side effects that usually do not require medical attention (report to your care team if they continue or are bothersome): Dry skin Headache Increased tears Nausea Pain, redness, or swelling with sores inside the mouth or throat Sensitivity to light Vomiting This list may not describe all possible side effects. Call your doctor for medical advice about side effects. You may report side effects to FDA at 1-800-FDA-1088. Where should I keep my medication? This medication is given in a hospital or  clinic. It will not be stored at home. NOTE: This sheet is a summary. It may not cover all possible information. If you have questions about this medicine, talk to your doctor, pharmacist, or health care provider.  2023 Elsevier/Gold Standard (2021-09-23 00:00:00) Bevacizumab Injection What is this medication? BEVACIZUMAB (be va SIZ yoo mab) treats some types of cancer. It works by blocking a protein that causes cancer cells to grow and multiply. This helps to slow or stop the spread of cancer cells. It is a monoclonal antibody. This medicine may be used for other purposes; ask your health care provider or pharmacist if you have questions. COMMON BRAND NAME(S): Alymsys, Avastin, MVASI, Noah Charon What should I tell my care team before I take this medication? They need to know if you have any of these conditions: Blood clots Coughing up blood Having or recent surgery Heart failure High blood pressure History of a connection between 2 or more body parts that do not usually connect (fistula) History of a tear in your stomach or intestines Protein in your urine An unusual or allergic reaction to bevacizumab, other medications, foods, dyes, or preservatives Pregnant or trying to get pregnant Breast-feeding How should I use this medication? This medication is injected into a vein. It is given by your care team in a hospital or clinic setting. Talk to your care team the use of this medication in children. Special care may be needed. Overdosage: If you think you have taken too much of this medicine contact a poison control center or emergency room at once. NOTE: This medicine is only for you. Do not share this medicine with others. What if I miss a dose? Keep appointments for follow-up doses. It is important not to miss your dose. Call your care team if you are unable to keep an appointment. What may interact with this medication? Interactions are not expected. This list may not describe all  possible interactions. Give your health care provider a list of all the medicines, herbs, non-prescription drugs, or dietary supplements you use. Also tell them if you smoke, drink alcohol, or use illegal drugs. Some items may interact with your medicine. What should I watch for while using this medication? Your condition will be monitored carefully while you are receiving this medication. You may need blood work while taking this medication. This medication may make you feel generally unwell. This is not uncommon as chemotherapy can affect healthy cells as well as cancer cells. Report any side effects. Continue your course of treatment even though you feel ill unless your care team tells you to stop. This medication may increase your risk to bruise or bleed. Call your care team if you notice any  unusual bleeding. Before having surgery, talk to your care team to make sure it is ok. This medication can increase the risk of poor healing of your surgical site or wound. You will need to stop this medication for 28 days before surgery. After surgery, wait at least 28 days before restarting this medication. Make sure the surgical site or wound is healed enough before restarting this medication. Talk to your care team if questions. Talk to your care team if you may be pregnant. Serious birth defects can occur if you take this medication during pregnancy and for 6 months after the last dose. Contraception is recommended while taking this medication and for 6 months after the last dose. Your care team can help you find the option that works for you. Do not breastfeed while taking this medication and for 6 months after the last dose. This medication can cause infertility. Talk to your care team if you are concerned about your fertility. What side effects may I notice from receiving this medication? Side effects that you should report to your care team as soon as possible: Allergic reactions--skin rash, itching,  hives, swelling of the face, lips, tongue, or throat Bleeding--bloody or black, tar-like stools, vomiting blood or brown material that looks like coffee grounds, red or dark brown urine, small red or purple spots on skin, unusual bruising or bleeding Blood clot--pain, swelling, or warmth in the leg, shortness of breath, chest pain Heart attack--pain or tightness in the chest, shoulders, arms, or jaw, nausea, shortness of breath, cold or clammy skin, feeling faint or lightheaded Heart failure--shortness of breath, swelling of the ankles, feet, or hands, sudden weight gain, unusual weakness or fatigue Increase in blood pressure Infection--fever, chills, cough, sore throat, wounds that don't heal, pain or trouble when passing urine, general feeling of discomfort or being unwell Infusion reactions--chest pain, shortness of breath or trouble breathing, feeling faint or lightheaded Kidney injury--decrease in the amount of urine, swelling of the ankles, hands, or feet Stomach pain that is severe, does not go away, or gets worse Stroke--sudden numbness or weakness of the face, arm, or leg, trouble speaking, confusion, trouble walking, loss of balance or coordination, dizziness, severe headache, change in vision Sudden and severe headache, confusion, change in vision, seizures, which may be signs of posterior reversible encephalopathy syndrome (PRES) Side effects that usually do not require medical attention (report to your care team if they continue or are bothersome): Back pain Change in taste Diarrhea Dry skin Increased tears Nosebleed This list may not describe all possible side effects. Call your doctor for medical advice about side effects. You may report side effects to FDA at 1-800-FDA-1088. Where should I keep my medication? This medication is given in a hospital or clinic. It will not be stored at home. NOTE: This sheet is a summary. It may not cover all possible information. If you have  questions about this medicine, talk to your doctor, pharmacist, or health care provider.  2023 Elsevier/Gold Standard (2021-09-30 00:00:00)

## 2022-02-25 DIAGNOSIS — C182 Malignant neoplasm of ascending colon: Secondary | ICD-10-CM | POA: Diagnosis not present

## 2022-02-26 ENCOUNTER — Inpatient Hospital Stay: Payer: 59

## 2022-02-26 VITALS — BP 125/61 | HR 94 | Temp 98.1°F | Resp 16 | Wt 169.0 lb

## 2022-02-26 DIAGNOSIS — C189 Malignant neoplasm of colon, unspecified: Secondary | ICD-10-CM | POA: Diagnosis not present

## 2022-02-26 DIAGNOSIS — C7800 Secondary malignant neoplasm of unspecified lung: Secondary | ICD-10-CM

## 2022-02-26 DIAGNOSIS — C7801 Secondary malignant neoplasm of right lung: Secondary | ICD-10-CM | POA: Diagnosis not present

## 2022-02-26 DIAGNOSIS — C182 Malignant neoplasm of ascending colon: Secondary | ICD-10-CM

## 2022-02-26 DIAGNOSIS — C7971 Secondary malignant neoplasm of right adrenal gland: Secondary | ICD-10-CM | POA: Diagnosis not present

## 2022-02-26 DIAGNOSIS — C7802 Secondary malignant neoplasm of left lung: Secondary | ICD-10-CM | POA: Diagnosis not present

## 2022-02-26 DIAGNOSIS — C7989 Secondary malignant neoplasm of other specified sites: Secondary | ICD-10-CM | POA: Diagnosis not present

## 2022-02-26 DIAGNOSIS — Z5189 Encounter for other specified aftercare: Secondary | ICD-10-CM | POA: Diagnosis not present

## 2022-02-26 DIAGNOSIS — Z5111 Encounter for antineoplastic chemotherapy: Secondary | ICD-10-CM | POA: Diagnosis not present

## 2022-02-26 DIAGNOSIS — C7931 Secondary malignant neoplasm of brain: Secondary | ICD-10-CM | POA: Diagnosis not present

## 2022-02-26 MED ORDER — SODIUM CHLORIDE 0.9% FLUSH
10.0000 mL | INTRAVENOUS | Status: DC | PRN
  Administered 2022-02-26: 10 mL

## 2022-02-26 MED ORDER — HEPARIN SOD (PORK) LOCK FLUSH 100 UNIT/ML IV SOLN
500.0000 [IU] | Freq: Once | INTRAVENOUS | Status: AC | PRN
  Administered 2022-02-26: 500 [IU]

## 2022-02-26 NOTE — Patient Instructions (Signed)
Fluorouracil Injection What is this medication? FLUOROURACIL (flure oh YOOR a sil) treats some types of cancer. It works by slowing down the growth of cancer cells. This medicine may be used for other purposes; ask your health care provider or pharmacist if you have questions. COMMON BRAND NAME(S): Adrucil What should I tell my care team before I take this medication? They need to know if you have any of these conditions: Blood disorders Dihydropyrimidine dehydrogenase (DPD) deficiency Infection, such as chickenpox, cold sores, herpes Kidney disease Liver disease Poor nutrition Recent or ongoing radiation therapy An unusual or allergic reaction to fluorouracil, other medications, foods, dyes, or preservatives If you or your partner are pregnant or trying to get pregnant Breast-feeding How should I use this medication? This medication is injected into a vein. It is administered by your care team in a hospital or clinic setting. Talk to your care team about the use of this medication in children. Special care may be needed. Overdosage: If you think you have taken too much of this medicine contact a poison control center or emergency room at once. NOTE: This medicine is only for you. Do not share this medicine with others. What if I miss a dose? Keep appointments for follow-up doses. It is important not to miss your dose. Call your care team if you are unable to keep an appointment. What may interact with this medication? Do not take this medication with any of the following: Live virus vaccines This medication may also interact with the following: Medications that treat or prevent blood clots, such as warfarin, enoxaparin, dalteparin This list may not describe all possible interactions. Give your health care provider a list of all the medicines, herbs, non-prescription drugs, or dietary supplements you use. Also tell them if you smoke, drink alcohol, or use illegal drugs. Some items may  interact with your medicine. What should I watch for while using this medication? Your condition will be monitored carefully while you are receiving this medication. This medication may make you feel generally unwell. This is not uncommon as chemotherapy can affect healthy cells as well as cancer cells. Report any side effects. Continue your course of treatment even though you feel ill unless your care team tells you to stop. In some cases, you may be given additional medications to help with side effects. Follow all directions for their use. This medication may increase your risk of getting an infection. Call your care team for advice if you get a fever, chills, sore throat, or other symptoms of a cold or flu. Do not treat yourself. Try to avoid being around people who are sick. This medication may increase your risk to bruise or bleed. Call your care team if you notice any unusual bleeding. Be careful brushing or flossing your teeth or using a toothpick because you may get an infection or bleed more easily. If you have any dental work done, tell your dentist you are receiving this medication. Avoid taking medications that contain aspirin, acetaminophen, ibuprofen, naproxen, or ketoprofen unless instructed by your care team. These medications may hide a fever. Do not treat diarrhea with over the counter products. Contact your care team if you have diarrhea that lasts more than 2 days or if it is severe and watery. This medication can make you more sensitive to the sun. Keep out of the sun. If you cannot avoid being in the sun, wear protective clothing and sunscreen. Do not use sun lamps, tanning beds, or tanning booths. Talk to   your care team if you or your partner wish to become pregnant or think you might be pregnant. This medication can cause serious birth defects if taken during pregnancy and for 3 months after the last dose. A reliable form of contraception is recommended while taking this  medication and for 3 months after the last dose. Talk to your care team about effective forms of contraception. Do not father a child while taking this medication and for 3 months after the last dose. Use a condom while having sex during this time period. Do not breastfeed while taking this medication. This medication may cause infertility. Talk to your care team if you are concerned about your fertility. What side effects may I notice from receiving this medication? Side effects that you should report to your care team as soon as possible: Allergic reactions--skin rash, itching, hives, swelling of the face, lips, tongue, or throat Heart attack--pain or tightness in the chest, shoulders, arms, or jaw, nausea, shortness of breath, cold or clammy skin, feeling faint or lightheaded Heart failure--shortness of breath, swelling of the ankles, feet, or hands, sudden weight gain, unusual weakness or fatigue Heart rhythm changes--fast or irregular heartbeat, dizziness, feeling faint or lightheaded, chest pain, trouble breathing High ammonia level--unusual weakness or fatigue, confusion, loss of appetite, nausea, vomiting, seizures Infection--fever, chills, cough, sore throat, wounds that don't heal, pain or trouble when passing urine, general feeling of discomfort or being unwell Low red blood cell level--unusual weakness or fatigue, dizziness, headache, trouble breathing Pain, tingling, or numbness in the hands or feet, muscle weakness, change in vision, confusion or trouble speaking, loss of balance or coordination, trouble walking, seizures Redness, swelling, and blistering of the skin over hands and feet Severe or prolonged diarrhea Unusual bruising or bleeding Side effects that usually do not require medical attention (report to your care team if they continue or are bothersome): Dry skin Headache Increased tears Nausea Pain, redness, or swelling with sores inside the mouth or throat Sensitivity  to light Vomiting This list may not describe all possible side effects. Call your doctor for medical advice about side effects. You may report side effects to FDA at 1-800-FDA-1088. Where should I keep my medication? This medication is given in a hospital or clinic. It will not be stored at home. NOTE: This sheet is a summary. It may not cover all possible information. If you have questions about this medicine, talk to your doctor, pharmacist, or health care provider.  2023 Elsevier/Gold Standard (2021-09-23 00:00:00)  

## 2022-03-03 ENCOUNTER — Inpatient Hospital Stay: Payer: 59 | Attending: Oncology

## 2022-03-03 VITALS — BP 101/78 | HR 97 | Temp 97.9°F | Resp 16

## 2022-03-03 DIAGNOSIS — C7800 Secondary malignant neoplasm of unspecified lung: Secondary | ICD-10-CM

## 2022-03-03 DIAGNOSIS — Z5111 Encounter for antineoplastic chemotherapy: Secondary | ICD-10-CM | POA: Insufficient documentation

## 2022-03-03 DIAGNOSIS — Z5189 Encounter for other specified aftercare: Secondary | ICD-10-CM | POA: Insufficient documentation

## 2022-03-03 DIAGNOSIS — Z23 Encounter for immunization: Secondary | ICD-10-CM | POA: Diagnosis not present

## 2022-03-03 DIAGNOSIS — C189 Malignant neoplasm of colon, unspecified: Secondary | ICD-10-CM

## 2022-03-03 DIAGNOSIS — C7931 Secondary malignant neoplasm of brain: Secondary | ICD-10-CM | POA: Diagnosis not present

## 2022-03-03 DIAGNOSIS — C182 Malignant neoplasm of ascending colon: Secondary | ICD-10-CM

## 2022-03-03 MED ORDER — FILGRASTIM-SNDZ 480 MCG/0.8ML IJ SOSY
480.0000 ug | PREFILLED_SYRINGE | Freq: Once | INTRAMUSCULAR | Status: AC
  Administered 2022-03-03: 480 ug via SUBCUTANEOUS
  Filled 2022-03-03: qty 0.8

## 2022-03-03 NOTE — Patient Instructions (Signed)
Filgrastim Injection What is this medication? FILGRASTIM (fil GRA stim) lowers the risk of infection in people who are receiving chemotherapy. It works by helping your body make more white blood cells, which protects your body from infection. It may also be used to help people who have been exposed to high doses of radiation. It can be used to help prepare your body before a stem cell transplant. It works by helping your bone marrow make and release stem cells into the blood. This medicine may be used for other purposes; ask your health care provider or pharmacist if you have questions. COMMON BRAND NAME(S): Neupogen, Nivestym, Releuko, Zarxio What should I tell my care team before I take this medication? They need to know if you have any of these conditions: History of blood diseases, such as sickle cell anemia Kidney disease Recent or ongoing radiation An unusual or allergic reaction to filgrastim, pegfilgrastim, latex, rubber, other medications, foods, dyes, or preservatives Pregnant or trying to get pregnant Breast-feeding How should I use this medication? This medication is injected under the skin or into a vein. It is usually given by your care team in a hospital or clinic setting. It may be given at home. If you get this medication at home, you will be taught how to prepare and give it. Use exactly as directed. Take it as directed on the prescription label at the same time every day. Keep taking it unless your care team tells you to stop. It is important that you put your used needles and syringes in a special sharps container. Do not put them in a trash can. If you do not have a sharps container, call your pharmacist or care team to get one. This medication comes with INSTRUCTIONS FOR USE. Ask your pharmacist for directions on how to use this medication. Read the information carefully. Talk to your pharmacist or care team if you have questions. Talk to your care team about the use of this  medication in children. While it may be prescribed for children for selected conditions, precautions do apply. Overdosage: If you think you have taken too much of this medicine contact a poison control center or emergency room at once. NOTE: This medicine is only for you. Do not share this medicine with others. What if I miss a dose? It is important not to miss any doses. Talk to your care team about what to do if you miss a dose. What may interact with this medication? Medications that may cause a release of neutrophils, such as lithium This list may not describe all possible interactions. Give your health care provider a list of all the medicines, herbs, non-prescription drugs, or dietary supplements you use. Also tell them if you smoke, drink alcohol, or use illegal drugs. Some items may interact with your medicine. What should I watch for while using this medication? Your condition will be monitored carefully while you are receiving this medication. You may need bloodwork while taking this medication. Talk to your care team about your risk of cancer. You may be more at risk for certain types of cancer if you take this medication. What side effects may I notice from receiving this medication? Side effects that you should report to your care team as soon as possible: Allergic reactions--skin rash, itching, hives, swelling of the face, lips, tongue, or throat Capillary leak syndrome--stomach or muscle pain, unusual weakness or fatigue, feeling faint or lightheaded, decrease in the amount of urine, swelling of the ankles, hands, or   feet, trouble breathing High white blood cell level--fever, fatigue, trouble breathing, night sweats, change in vision, weight loss Inflammation of the aorta--fever, fatigue, back, chest, or stomach pain, severe headache Kidney injury (glomerulonephritis)--decrease in the amount of urine, red or dark brown urine, foamy or bubbly urine, swelling of the ankles, hands, or  feet Shortness of breath or trouble breathing Spleen injury--pain in upper left stomach or shoulder Unusual bruising or bleeding Side effects that usually do not require medical attention (report to your care team if they continue or are bothersome): Back pain Bone pain Fatigue Fever Headache Nausea This list may not describe all possible side effects. Call your doctor for medical advice about side effects. You may report side effects to FDA at 1-800-FDA-1088. Where should I keep my medication? Keep out of the reach of children and pets. Keep this medication in the original packaging until you are ready to take it. Protect from light. See product for storage information. Each product may have different instructions. Get rid of any unused medication after the expiration date. To get rid of medications that are no longer needed or have expired: Take the medication to a medications take-back program. Check with your pharmacy or law enforcement to find a location. If you cannot return the medication, ask your pharmacist or care team how to get rid of this medication safely. NOTE: This sheet is a summary. It may not cover all possible information. If you have questions about this medicine, talk to your doctor, pharmacist, or health care provider.  2023 Elsevier/Gold Standard (2021-08-26 00:00:00)  

## 2022-03-04 ENCOUNTER — Inpatient Hospital Stay: Payer: 59

## 2022-03-04 VITALS — BP 114/81 | HR 100 | Temp 98.3°F | Resp 18 | Ht 68.0 in | Wt 166.1 lb

## 2022-03-04 DIAGNOSIS — C7931 Secondary malignant neoplasm of brain: Secondary | ICD-10-CM | POA: Diagnosis not present

## 2022-03-04 DIAGNOSIS — Z23 Encounter for immunization: Secondary | ICD-10-CM | POA: Diagnosis not present

## 2022-03-04 DIAGNOSIS — C189 Malignant neoplasm of colon, unspecified: Secondary | ICD-10-CM

## 2022-03-04 DIAGNOSIS — Z5189 Encounter for other specified aftercare: Secondary | ICD-10-CM | POA: Diagnosis not present

## 2022-03-04 DIAGNOSIS — C182 Malignant neoplasm of ascending colon: Secondary | ICD-10-CM

## 2022-03-04 DIAGNOSIS — Z5111 Encounter for antineoplastic chemotherapy: Secondary | ICD-10-CM | POA: Diagnosis not present

## 2022-03-04 MED ORDER — FILGRASTIM-SNDZ 480 MCG/0.8ML IJ SOSY
480.0000 ug | PREFILLED_SYRINGE | Freq: Once | INTRAMUSCULAR | Status: AC
  Administered 2022-03-04: 480 ug via SUBCUTANEOUS
  Filled 2022-03-04: qty 0.8

## 2022-03-04 NOTE — Patient Instructions (Signed)
Filgrastim Injection What is this medication? FILGRASTIM (fil GRA stim) lowers the risk of infection in people who are receiving chemotherapy. It works by helping your body make more white blood cells, which protects your body from infection. It may also be used to help people who have been exposed to high doses of radiation. It can be used to help prepare your body before a stem cell transplant. It works by helping your bone marrow make and release stem cells into the blood. This medicine may be used for other purposes; ask your health care provider or pharmacist if you have questions. COMMON BRAND NAME(S): Neupogen, Nivestym, Releuko, Zarxio What should I tell my care team before I take this medication? They need to know if you have any of these conditions: History of blood diseases, such as sickle cell anemia Kidney disease Recent or ongoing radiation An unusual or allergic reaction to filgrastim, pegfilgrastim, latex, rubber, other medications, foods, dyes, or preservatives Pregnant or trying to get pregnant Breast-feeding How should I use this medication? This medication is injected under the skin or into a vein. It is usually given by your care team in a hospital or clinic setting. It may be given at home. If you get this medication at home, you will be taught how to prepare and give it. Use exactly as directed. Take it as directed on the prescription label at the same time every day. Keep taking it unless your care team tells you to stop. It is important that you put your used needles and syringes in a special sharps container. Do not put them in a trash can. If you do not have a sharps container, call your pharmacist or care team to get one. This medication comes with INSTRUCTIONS FOR USE. Ask your pharmacist for directions on how to use this medication. Read the information carefully. Talk to your pharmacist or care team if you have questions. Talk to your care team about the use of this  medication in children. While it may be prescribed for children for selected conditions, precautions do apply. Overdosage: If you think you have taken too much of this medicine contact a poison control center or emergency room at once. NOTE: This medicine is only for you. Do not share this medicine with others. What if I miss a dose? It is important not to miss any doses. Talk to your care team about what to do if you miss a dose. What may interact with this medication? Medications that may cause a release of neutrophils, such as lithium This list may not describe all possible interactions. Give your health care provider a list of all the medicines, herbs, non-prescription drugs, or dietary supplements you use. Also tell them if you smoke, drink alcohol, or use illegal drugs. Some items may interact with your medicine. What should I watch for while using this medication? Your condition will be monitored carefully while you are receiving this medication. You may need bloodwork while taking this medication. Talk to your care team about your risk of cancer. You may be more at risk for certain types of cancer if you take this medication. What side effects may I notice from receiving this medication? Side effects that you should report to your care team as soon as possible: Allergic reactions--skin rash, itching, hives, swelling of the face, lips, tongue, or throat Capillary leak syndrome--stomach or muscle pain, unusual weakness or fatigue, feeling faint or lightheaded, decrease in the amount of urine, swelling of the ankles, hands, or   feet, trouble breathing High white blood cell level--fever, fatigue, trouble breathing, night sweats, change in vision, weight loss Inflammation of the aorta--fever, fatigue, back, chest, or stomach pain, severe headache Kidney injury (glomerulonephritis)--decrease in the amount of urine, red or dark brown urine, foamy or bubbly urine, swelling of the ankles, hands, or  feet Shortness of breath or trouble breathing Spleen injury--pain in upper left stomach or shoulder Unusual bruising or bleeding Side effects that usually do not require medical attention (report to your care team if they continue or are bothersome): Back pain Bone pain Fatigue Fever Headache Nausea This list may not describe all possible side effects. Call your doctor for medical advice about side effects. You may report side effects to FDA at 1-800-FDA-1088. Where should I keep my medication? Keep out of the reach of children and pets. Keep this medication in the original packaging until you are ready to take it. Protect from light. See product for storage information. Each product may have different instructions. Get rid of any unused medication after the expiration date. To get rid of medications that are no longer needed or have expired: Take the medication to a medications take-back program. Check with your pharmacy or law enforcement to find a location. If you cannot return the medication, ask your pharmacist or care team how to get rid of this medication safely. NOTE: This sheet is a summary. It may not cover all possible information. If you have questions about this medicine, talk to your doctor, pharmacist, or health care provider.  2023 Elsevier/Gold Standard (2021-08-26 00:00:00)  

## 2022-03-05 ENCOUNTER — Inpatient Hospital Stay: Payer: 59

## 2022-03-05 ENCOUNTER — Other Ambulatory Visit: Payer: Self-pay | Admitting: Radiation Therapy

## 2022-03-05 DIAGNOSIS — C182 Malignant neoplasm of ascending colon: Secondary | ICD-10-CM

## 2022-03-05 DIAGNOSIS — C189 Malignant neoplasm of colon, unspecified: Secondary | ICD-10-CM

## 2022-03-05 DIAGNOSIS — Z5111 Encounter for antineoplastic chemotherapy: Secondary | ICD-10-CM | POA: Diagnosis not present

## 2022-03-05 DIAGNOSIS — C7931 Secondary malignant neoplasm of brain: Secondary | ICD-10-CM | POA: Diagnosis not present

## 2022-03-05 DIAGNOSIS — Z23 Encounter for immunization: Secondary | ICD-10-CM | POA: Diagnosis not present

## 2022-03-05 DIAGNOSIS — Z5189 Encounter for other specified aftercare: Secondary | ICD-10-CM | POA: Diagnosis not present

## 2022-03-05 MED ORDER — FILGRASTIM-SNDZ 480 MCG/0.8ML IJ SOSY
480.0000 ug | PREFILLED_SYRINGE | Freq: Once | INTRAMUSCULAR | Status: AC
  Administered 2022-03-05: 480 ug via SUBCUTANEOUS
  Filled 2022-03-05: qty 0.8

## 2022-03-05 MED ORDER — INFLUENZA VAC SPLIT QUAD 0.5 ML IM SUSY
0.5000 mL | PREFILLED_SYRINGE | Freq: Once | INTRAMUSCULAR | Status: AC
  Administered 2022-03-05: 0.5 mL via INTRAMUSCULAR
  Filled 2022-03-05: qty 0.5

## 2022-03-05 NOTE — Progress Notes (Signed)
Orders placed to access and de-access port for brain MRI contrast.  Mont Dutton R.T.(R)(T) Radiation Special Procedures Navigator

## 2022-03-05 NOTE — Patient Instructions (Signed)
Influenza, Adult Influenza is also called "the flu." It is an infection in the lungs, nose, and throat (respiratory tract). It spreads easily from person to person (is contagious). The flu causes symptoms that are like a cold, along with high fever and body aches. What are the causes? This condition is caused by the influenza virus. You can get the virus by: Breathing in droplets that are in the air after a person infected with the flu coughed or sneezed. Touching something that has the virus on it and then touching your mouth, nose, or eyes. What increases the risk? Certain things may make you more likely to get the flu. These include: Not washing your hands often. Having close contact with many people during cold and flu season. Touching your mouth, eyes, or nose without first washing your hands. Not getting a flu shot every year. You may have a higher risk for the flu, and serious problems, such as a lung infection (pneumonia), if you: Are older than 65. Are pregnant. Have a weakened disease-fighting system (immune system) because of a disease or because you are taking certain medicines. Have a long-term (chronic) condition, such as: Heart, kidney, or lung disease. Diabetes. Asthma. Have a liver disorder. Are very overweight (morbidly obese). Have anemia. What are the signs or symptoms? Symptoms usually begin suddenly and last 4-14 days. They may include: Fever and chills. Headaches, body aches, or muscle aches. Sore throat. Cough. Runny or stuffy (congested) nose. Feeling discomfort in your chest. Not wanting to eat as much as normal. Feeling weak or tired. Feeling dizzy. Feeling sick to your stomach or throwing up. How is this treated? If the flu is found early, you can be treated with antiviral medicine. This can help to reduce how bad the illness is and how long it lasts. This may be given by mouth or through an IV tube. Taking care of yourself at home can help your  symptoms get better. Your doctor may want you to: Take over-the-counter medicines. Drink plenty of fluids. The flu often goes away on its own. If you have very bad symptoms or other problems, you may be treated in a hospital. Follow these instructions at home:     Activity Rest as needed. Get plenty of sleep. Stay home from work or school as told by your doctor. Do not leave home until you do not have a fever for 24 hours without taking medicine. Leave home only to go to your doctor. Eating and drinking Take an ORS (oral rehydration solution). This is a drink that is sold at pharmacies and stores. Drink enough fluid to keep your pee pale yellow. Drink clear fluids in small amounts as you are able. Clear fluids include: Water. Ice chips. Fruit juice mixed with water. Low-calorie sports drinks. Eat bland foods that are easy to digest. Eat small amounts as you are able. These foods include: Bananas. Applesauce. Rice. Lean meats. Toast. Crackers. Do not eat or drink: Fluids that have a lot of sugar or caffeine. Alcohol. Spicy or fatty foods. General instructions Take over-the-counter and prescription medicines only as told by your doctor. Use a cool mist humidifier to add moisture to the air in your home. This can make it easier for you to breathe. When using a cool mist humidifier, clean it daily. Empty water and replace with clean water. Cover your mouth and nose when you cough or sneeze. Wash your hands with soap and water often and for at least 20 seconds. This is also   important after you cough or sneeze. If you cannot use soap and water, use alcohol-based hand sanitizer. Keep all follow-up visits. How is this prevented?  Get a flu shot every year. You may get the flu shot in late summer, fall, or winter. Ask your doctor when you should get your flu shot. Avoid contact with people who are sick during fall and winter. This is cold and flu season. Contact a doctor if: You  get new symptoms. You have: Chest pain. Watery poop (diarrhea). A fever. Your cough gets worse. You start to have more mucus. You feel sick to your stomach. You throw up. Get help right away if you: Have shortness of breath. Have trouble breathing. Have skin or nails that turn a bluish color. Have very bad pain or stiffness in your neck. Get a sudden headache. Get sudden pain in your face or ear. Cannot eat or drink without throwing up. These symptoms may represent a serious problem that is an emergency. Get medical help right away. Call your local emergency services (911 in the [place] Do not wait to see if the symptoms will go away. Do not drive yourself to the hospital. Summary Influenza is also called "the flu." It is an infection in the lungs, nose, and throat. It spreads easily from person to person. Take over-the-counter and prescription medicines only as told by your doctor. Getting a flu shot every year is the best way to not get the flu. This information is not intended to replace advice given to you by your health care provider. Make sure you discuss any questions you have with your health care provider. Document Revised: 01/05/2020 Document Reviewed: 01/05/2020 Elsevier Patient Education  [employer]. Filgrastim Injection What is this medication? FILGRASTIM (fil GRA stim) lowers the risk of infection in people who are receiving chemotherapy. It works by Building control surveyor make more white blood cells, which protects your body from infection. It may also be used to help people who have been exposed to high doses of radiation. It can be used to help prepare your body before a stem cell transplant. It works by helping your bone marrow make and release stem cells into the blood. This medicine may be used for other purposes; ask your health care provider or pharmacist if you have questions. COMMON BRAND NAME(S): Neupogen, Nivestym, Releuko, Zarxio What should I tell my care  team before I take this medication? They need to know if you have any of these conditions: History of blood diseases, such as sickle cell anemia Kidney disease Recent or ongoing radiation An unusual or allergic reaction to filgrastim, pegfilgrastim, latex, rubber, other medications, foods, dyes, or preservatives Pregnant or trying to get pregnant Breast-feeding How should I use this medication? This medication is injected under the skin or into a vein. It is usually given by your care team in a hospital or clinic setting. It may be given at home. If you get this medication at home, you will be taught how to prepare and give it. Use exactly as directed. Take it as directed on the prescription label at the same time every day. Keep taking it unless your care team tells you to stop. It is important that you put your used needles and syringes in a special sharps container. Do not put them in a trash can. If you do not have a sharps container, call your pharmacist or care team to get one. This medication comes with INSTRUCTIONS FOR USE. Ask your pharmacist for  directions on how to use this medication. Read the information carefully. Talk to your pharmacist or care team if you have questions. Talk to your care team about the use of this medication in children. While it may be prescribed for children for selected conditions, precautions do apply. Overdosage: If you think you have taken too much of this medicine contact a poison control center or emergency room at once. NOTE: This medicine is only for you. Do not share this medicine with others. What if I miss a dose? It is important not to miss any doses. Talk to your care team about what to do if you miss a dose. What may interact with this medication? Medications that may cause a release of neutrophils, such as lithium This list may not describe all possible interactions. Give your health care provider a list of all the medicines, herbs,  non-prescription drugs, or dietary supplements you use. Also tell them if you smoke, drink alcohol, or use illegal drugs. Some items may interact with your medicine. What should I watch for while using this medication? Your condition will be monitored carefully while you are receiving this medication. You may need bloodwork while taking this medication. Talk to your care team about your risk of cancer. You may be more at risk for certain types of cancer if you take this medication. What side effects may I notice from receiving this medication? Side effects that you should report to your care team as soon as possible: Allergic reactions--skin rash, itching, hives, swelling of the face, lips, tongue, or throat Capillary leak syndrome--stomach or muscle pain, unusual weakness or fatigue, feeling faint or lightheaded, decrease in the amount of urine, swelling of the ankles, hands, or feet, trouble breathing High white blood cell level--fever, fatigue, trouble breathing, night sweats, change in vision, weight loss Inflammation of the aorta--fever, fatigue, back, chest, or stomach pain, severe headache Kidney injury (glomerulonephritis)--decrease in the amount of urine, red or dark brown urine, foamy or bubbly urine, swelling of the ankles, hands, or feet Shortness of breath or trouble breathing Spleen injury--pain in upper left stomach or shoulder Unusual bruising or bleeding Side effects that usually do not require medical attention (report to your care team if they continue or are bothersome): Back pain Bone pain Fatigue Fever Headache Nausea This list may not describe all possible side effects. Call your doctor for medical advice about side effects. You may report side effects to FDA at 1-800-FDA-1088. Where should I keep my medication? Keep out of the reach of children and pets. Keep this medication in the original packaging until you are ready to take it. Protect from light. See product for  storage information. Each product may have different instructions. Get rid of any unused medication after the expiration date. To get rid of medications that are no longer needed or have expired: Take the medication to a medications take-back program. Check with your pharmacy or law enforcement to find a location. If you cannot return the medication, ask your pharmacist or care team how to get rid of this medication safely. NOTE: This sheet is a summary. It may not cover all possible information. If you have questions about this medicine, talk to your doctor, pharmacist, or health care provider.  2023 Elsevier/Gold Standard (2021-08-26 00:00:00)

## 2022-03-06 ENCOUNTER — Ambulatory Visit
Admission: RE | Admit: 2022-03-06 | Discharge: 2022-03-06 | Disposition: A | Discharge status: Home / Self Care | Payer: 59 | Source: Ambulatory Visit | Attending: Internal Medicine | Admitting: Internal Medicine

## 2022-03-06 DIAGNOSIS — C729 Malignant neoplasm of central nervous system, unspecified: Secondary | ICD-10-CM | POA: Diagnosis not present

## 2022-03-06 DIAGNOSIS — C7931 Secondary malignant neoplasm of brain: Secondary | ICD-10-CM

## 2022-03-06 MED ORDER — SODIUM CHLORIDE 0.9% FLUSH
10.0000 mL | INTRAVENOUS | Status: DC | PRN
  Administered 2022-03-06: 10 mL via INTRAVENOUS

## 2022-03-06 MED ORDER — GADOPICLENOL 0.5 MMOL/ML IV SOLN
8.0000 mL | Freq: Once | INTRAVENOUS | Status: AC | PRN
  Administered 2022-03-06: 8 mL via INTRAVENOUS

## 2022-03-06 MED ORDER — HEPARIN SOD (PORK) LOCK FLUSH 100 UNIT/ML IV SOLN: 500.0000 [IU] | Freq: Once | INTRAVENOUS | Status: DC

## 2022-03-07 DIAGNOSIS — C182 Malignant neoplasm of ascending colon: Secondary | ICD-10-CM | POA: Diagnosis not present

## 2022-03-09 ENCOUNTER — Inpatient Hospital Stay (HOSPITAL_BASED_OUTPATIENT_CLINIC_OR_DEPARTMENT_OTHER): Payer: 59 | Admitting: Internal Medicine

## 2022-03-09 ENCOUNTER — Inpatient Hospital Stay: Payer: 59

## 2022-03-09 DIAGNOSIS — C7931 Secondary malignant neoplasm of brain: Secondary | ICD-10-CM

## 2022-03-09 NOTE — Progress Notes (Signed)
I connected with Shannon [name] on 03/09/22 at 10:30 AM EDT by telephone visit and verified that I am speaking with the correct person using two identifiers.  I discussed the limitations, risks, security and privacy concerns of performing an evaluation and management service by telemedicine and the availability of in-person appointments. I also discussed with the patient that there may be a patient responsible charge related to this service. The patient expressed understanding and agreed to proceed.  Other persons participating in the visit and their role in the encounter:  n/a  Patient's location:  Home Provider's location:  Office Chief Complaint:  Malignant neoplasm metastatic to brain [facility]) - Plan: MR BRAIN W WO CONTRAST  History of Present Ilness: Shannon [name] reports no clinical changes today.  She does experience some dizziness and imbalance following chemo treatments, but then returns to normal within several days.  No headaches, seizures.  Still on FOLFIRI infusions.  Observations: Language and cognition at baseline  Imaging:  [place] Clinician Interpretation: I have personally reviewed the CNS images as listed.  My interpretation, in the context of the patient's clinical presentation, is stable disease  MR BRAIN W WO CONTRAST  Result Date: 03/08/2022 CLINICAL DATA:  Provided history: Malignant neoplasm metastatic to brain. Brain/CNS neoplasm, assess treatment response. Additional history obtained from EMR: History of metastatic colon cancer. EXAM: MRI HEAD WITHOUT AND WITH CONTRAST TECHNIQUE: Multiplanar, multiecho pulse sequences of the brain and surrounding structures were obtained without and with intravenous contrast. CONTRAST:  8 mL Vueway intravenous contrast. COMPARISON:  Prior brain MRI examinations 11/06/2021 and earlier. FINDINGS: Brain: Redemonstrated postoperative changes from prior left suboccipital craniectomy and left cerebellar mass resection.  Encephalomalacia/gliosis and chronic blood products at the resection site. Unchanged ill-defined enhancement along the periphery of the resection cavity, likely postoperative. Unchanged small adjacent left occipital fluid collection. Stable 4 mm enhancing lesion within the right cerebellar hemisphere (series 13, image 44) (series 15, image 17). No new intracranial metastases are identified. As before, there are a few tiny nonspecific T2 FLAIR hyperintense remote insults scattered within the bilateral cerebral white matter. There is no acute infarct. No extra-axial fluid collection. No midline shift. Vascular: Maintained flow voids within the proximal large arterial vessels. Skull and upper cervical spine: No focal suspicious marrow lesion. Prior left suboccipital craniectomy. Sinuses/Orbits: No mass or acute finding within the imaged orbits. Mild mucosal thickening within the right sphenoid and bilateral maxillary sinuses. IMPRESSION: 1. Stable postoperative changes within the left cerebellar hemisphere. 2. Unchanged 4 mm enhancing lesion within the right cerebellar hemisphere. 3. No new sites of intracranial metastatic disease are identified. Electronically Signed   By: Kellie Simmering D.O.   On: 03/08/2022 10:31    Assessment and Plan: Malignant neoplasm metastatic to brain [facility]) - Plan: MR BRAIN W WO CONTRAST  Clinically and radiographically stable today.  Non new or progressive findings.  Follow Up Instructions: RTC with MRI brain in 6 months  I discussed the assessment and treatment plan with the patient.  The patient was provided an opportunity to ask questions and all were answered.  The patient agreed with the plan and demonstrated understanding of the instructions.    The patient was advised to call back or seek an in-person evaluation if the symptoms worsen or if the condition fails to improve as anticipated.    Ventura Sellers, MD   I provided 15 minutes of non face-to-face telephone visit  time during this encounter, and > 50% was spent  counseling as documented under my assessment & plan.

## 2022-03-10 ENCOUNTER — Other Ambulatory Visit: Payer: Self-pay | Admitting: Radiation Therapy

## 2022-03-11 ENCOUNTER — Other Ambulatory Visit: Payer: Self-pay

## 2022-03-11 ENCOUNTER — Ambulatory Visit (INDEPENDENT_AMBULATORY_CARE_PROVIDER_SITE_OTHER): Payer: 59 | Admitting: Psychiatry

## 2022-03-11 ENCOUNTER — Encounter: Payer: Self-pay | Admitting: Psychiatry

## 2022-03-11 DIAGNOSIS — G4721 Circadian rhythm sleep disorder, delayed sleep phase type: Secondary | ICD-10-CM

## 2022-03-11 DIAGNOSIS — F422 Mixed obsessional thoughts and acts: Secondary | ICD-10-CM

## 2022-03-11 DIAGNOSIS — G3184 Mild cognitive impairment, so stated: Secondary | ICD-10-CM

## 2022-03-11 DIAGNOSIS — R530 Neoplastic (malignant) related fatigue: Secondary | ICD-10-CM

## 2022-03-11 DIAGNOSIS — F333 Major depressive disorder, recurrent, severe with psychotic symptoms: Secondary | ICD-10-CM | POA: Diagnosis not present

## 2022-03-11 DIAGNOSIS — R69 Illness, unspecified: Secondary | ICD-10-CM | POA: Diagnosis not present

## 2022-03-11 MED ORDER — MODAFINIL 200 MG PO TABS: ORAL_TABLET | ORAL | 3 refills | Status: DC

## 2022-03-11 NOTE — Progress Notes (Signed)
Shannon [name] [id number] 1965/10/29 56 y.o.    Subjective:   Patient ID:  Shannon [name] is a 56 y.o. (DOB 08-01-1965) female.  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  seen November 2020 .  No meds were changed at that time.  10/16/19 the following is noted at this appt: Asked questions about sleep supplement.   Just started GI doctor.   Good overall. No mood swings. No depressive episodes.  Cry more with hormonal changes more easily.   Probably sleep too much to avoid.  Occ stress from work interferes with sleep.  Not as productive from home.  Hopes to get back to the office.  Still working from home.  Boss noticed her forgetfulness with deadlines and missing issues in emails.  Used to be sharper. Wonders if related to meds.  Can usually nap if desired for years and not seasonal.  Only anxiety is Covid related. More poor self care.   Hysterectomy December 2020. No med changes  03/18/2020 appointment with the following noted: July had Covid and both parents and her father died from [place]. Able to help mom with arrangements. Required one iron infusion this year. Never took NAC bc it was too hard to take. Sleep, eating schedule is irregular. More productive at home helps her feel better. Plan: No med changes. 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 For a month had dizzy, falls, NV all of which cleared quickly after removal.  Radiation to brain after this. More CA in lungs and abdomen and having chemo.  Oncologist said she has about 5 years of lifespan left.  MRI and CT pending. Gotten into therapy starting Jenny Reichmann  Roden[place]augh next week.   Pray a lot and good connection with friends.  Good support.  Trying to do things for fun.   Is working but finding it difficult mentally and physically DT SE fatigue from chemo. Considering LT disability but $ concerns.   If anxious trouble falling asleep.   Works from home.   D 54 yo.   Plan: No med changes except added modafinil  11/26/2020 appointment with the following noted: Added modafinil and didn't notice much from it. Sleep average about 7 hours.  Would prefer 10 hours and may nap here and there in week of chemo even on chemo.  Had to get it at [facility] with [facility]. No dx of OSA. Last 2 weeks a little low emotionally and mentally after up for a couple of weeks with company and it wore her out. Sleepiness is normally worse than tiredness. Only situational mood effects. Plan: No benefit 200 mg modafinil nor SE therefore Increase Modafinil to 300 mg daily for excessive sleepiness  04/21/2021 appointment with the following noted: Still dealing with chemotx for colon cancer Thinks increase modafinil questionable but misses it more than takes it. No SE. Things are getting harder rough month with more depression and anxiety related to cancer and daughter.  Work is hard.  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  Missing deadlines.   Needs to call social security. D 54 yo and in therapy with [name].  So angry  with patient. Has been able to stay on branded Seroquel. Tumors on CT scan  are shrinking recently. Plan: HX RELAPSE PSYCHOSIS WITH GENERIC QUETIAPINE, Continue branded Seroquel 800 mg HS and lamotrigine 150 mg daily and Luvox 300 mg daily Vraylar 1.5 mg daily for 2 weeks, Then if tolerated increase to 3 mg daily. If mood improves then reduce Seroquel to 1 and 1/2 tablets.  06/06/2021 appt noted: I like it.  More clear headed and better energy.  Missed a few bc out of samples. Reduced Seroquel 600 mg HS Less depressed.  Especially  right after starting and others noticed the benefit. Plan : Continue lamotrigine 150 mg daily and Luvox 300 mg daily Vraylar 3 mg daily. Reduce Seroquel to 400 mg nightly for 2 weeks,  Then reduce Seroquel to 200 mg nightly for 2 weeks,  Then reduce to 100 mg for 2 weeks, Then reduce to 50 mg 2 weeks then stop if you can still sleep ok  08/07/2021 appointment with the following noted: Continues Vraylar 3 mg and Luvox 300 mg daily.  Reduced Seroquel to 200 mg nightly and takes Ambien 5 mg nightly Went slowly down with Seroquel. Doing pretty good.  Some night hard to go to sleep but usally ok.   Still please with Vraylar less sedated. No mood swings.  Some struggle with depression with reduced motivation and enjoyment.  Sometimes bored and would rather go to bed.  Don't want to read or watch TV.   Can follow a show but doesn't find it intersting.  CT scan show tumors shrinking. Plan: Continue lamotrigine 150 mg daily and Luvox 300 mg daily Reduce Vraylar 3 mg every other day to see If depression energy and interest and enjoyment is better Reduce Seroquel to 150 for 2 weeks Then reduce to 100 mg for 2 weeks, Then reduce to 50 mg 2 weeks then stop if you can still sleep ok Wants to have access to Ambien Plan: No benefit 200 mg modafinil nor SE therefore OK continue trial  Modafinil to 300 mg daily for excessive sleepiness  10/30/2021 appointment with the following noted: Reduced quetiapine to 100 mg and trouble sleeping so back to 150 mg HS Fine with reduced Vraylar to 3 mg QOD Increase modafinil to 300 mg and is more alert and more active. No SE Overall mood is ok but still likes to resto often but not sleeping as much.  Not sure if it is chemo related.  Will rest after activity. At night sleeps 10 hours. Still hard to enjoy things and part of reason will lay down bc is bored.   Changed TV cable and not watching much now.  Can't concentrate to read well.  Enjoys dog.  Enjoying some projects at  home.  Patient denies difficulty with sleep maintenance.   Denies appetite disturbance.  Patient reports that motivation have been good.  Patient denies any suicidal ideation.  Still too monotone. This is last week at work.  Not doing well at work DT concentration.  Will apply for disability. Ativan only every few weeks. Plan: Continue lamotrigine 150 mg daily  Continue Vraylar 3 mg every other day  Try again to Reduce Seroquel to 100 mg for 2 weeks, Then reduce to 50 mg 2 weeks then stop if you can still sleep ok Wants to have access to Ambien Reduce Luvox to 2 and 1/2 tablets Start Wellbutrin in the AM 1 for 1 week then 2 or 300 mg AM  12/22/21 appt noted:  Reduced quetiapine to 50 and was OK but when stopped couldn't sleep.  Less daytime drowsy with less.   Reduced fluvoxamine to 250 and started Wellbutrin XL 300 mg AM. More even keel level and less down.  More content.  More steady.  Less teary and emotional. No SE problems with change. ? Modafinil in hopes of better concentration and clarity but ? Effect. Has some trouble with conc but it varies. No increase anxiety or jitteriness. Plan: Continue lamotrigine 150 mg daily  Continue Vraylar 3 mg every other day  Try again to Reduce Seroquel to 25-50 mg HS Wants to have access to Ambien Continue Luvox to 2 and 1/2 tablets Increase Wellbutrin to 450 mg AM residual depression anhedonia  03/11/2022 appointment noted Increase Wellbutrin to 450 mg AM residual depression anhedonia is partially better.  Not as heavy and cloudy.  Energy a little better until last 2 chemos wiped her out more with nausea for 6 days.  Chemo every 3 weeks. Enjoys her dog and helps her get up.   No SE with Wellbutrin.   Minimal anxiety usually.  Except a couple of days per week.  Anxiety over bills.   No sig obsessions.   Problems with daughter are stressful.  D drinks too much and sleeps all day and has social problems and irresponsibility. D Not working.  She is  54 yo and very needy and childlike emotionally. Reduced Seroquel to 25 mg HS and willing to try to stop.  Avg 10 hour and some naps. But no longer hangover without much Seroquel. Has taken more Ambien.  Past Psychiatric Medication Trials: HX RELAPSE PSYCHOSIS WITH GENERIC QUETIAPINE,  Seroquel 800, Risperdal 2, inVega 3, Abilify 15, Vraylar 3 Citalopram, fluoxetine 80, sertraline to 50, fluvoxamine 300 mg daily lamotrigine 200,  Modafinil 300 Has been under the care of the psychiatrist since March 2000  D seeing [name]  Review of Systems:  Review of Systems  Constitutional:  Positive for fatigue.  HENT:  Negative for rhinorrhea.   Eyes:  Negative for discharge.  Cardiovascular:  Negative for palpitations.  Gastrointestinal:  Positive for constipation. Negative for abdominal distention.  Neurological:  Positive for weakness. Negative for tremors.       No history of SZ  Psychiatric/Behavioral:  Positive for decreased concentration and dysphoric mood.     Medications: I have reviewed the patient's current medications.  Current Outpatient Medications  Medication Sig Dispense Refill   acetaminophen (TYLENOL) 325 MG tablet Take 2 tablets (650 mg total) by mouth every 6 (six) hours as needed for mild pain.     atorvastatin (LIPITOR) 10 MG tablet Take by mouth.     buPROPion (WELLBUTRIN XL) 150 MG 24 hr tablet TAKE 3 TABLETS BY MOUTH EVERY MORNING. 270 tablet 0   cariprazine (VRAYLAR) 3 MG capsule Take 1 capsule (3 mg total) by mouth daily. (Patient taking differently: Take 3 mg by mouth daily. Every other day) 30 capsule 1   CONTOUR NEXT TEST test strip 3 (three) times daily.     Dulaglutide (TRULICITY) 1.5 BS/9.6GE SOPN Inject into the skin.     fluvoxaMINE (LUVOX) 100 MG tablet TAKE 3 TABLETS (300 MG TOTAL) BY MOUTH AT BEDTIME 270 tablet 0   HUMALOG KWIKPEN 100 UNIT/ML KwikPen Inject into the skin.     hyoscyamine (LEVSIN SL) 0.125 MG SL tablet Place 1 tablet (0.125 mg total)  under the tongue every 6 (six) hours as needed. 45 tablet 1   insulin aspart (NOVOLOG  FLEXPEN) 100 UNIT/ML FlexPen Inject 8 Units into the skin 3 (three) times daily with meals. 15 mL 11   insulin glargine (LANTUS) 100 UNIT/ML Solostar Pen Inject 26 Units into the skin daily. 15 mL 11   lamoTRIgine (LAMICTAL) 150 MG tablet Take 1 tablet (150 mg total) by mouth daily. 90 tablet 3   levothyroxine (SYNTHROID) 75 MCG tablet Take 1 tablet (75 mcg total) by mouth daily. 30 tablet 0   loratadine (CLARITIN) 10 MG tablet Take 10 mg by mouth daily.     LORazepam (ATIVAN) 0.5 MG tablet Take 0.5 mg by mouth every 8 (eight) hours as needed for anxiety.     metFORMIN (GLUCOPHAGE) 500 MG tablet Take 1 tablet (500 mg total) by mouth 2 (two) times daily with a meal. 60 tablet 0   modafinil (PROVIGIL) 200 MG tablet TAKE 1 & 1/2 (ONE & ONE-HALF) TABLETS BY MOUTH ONCE DAILY 45 tablet 3   Multiple Vitamin (MULTI-VITAMIN) tablet Take 1 tablet by mouth daily.     NOVOFINE PEN NEEDLE 32G X 6 MM MISC SMARTSIG:Syringe(s) SUB-Q     Omega-3 1000 MG CAPS Take by mouth.     ondansetron (ZOFRAN) 4 MG tablet Take 1 tablet (4 mg total) by mouth every 4 (four) hours as needed for nausea. 90 tablet 3   pantoprazole (PROTONIX) 40 MG tablet TAKE 1 TABLET BY MOUTH EVERYDAY AT BEDTIME 30 tablet 0   potassium chloride SA (KLOR-CON M) 20 MEQ tablet Take 1 tablet (20 mEq total) by mouth daily. 5 tablet 0   prochlorperazine (COMPAZINE) 10 MG tablet TAKE 1 TABLET BY MOUTH EVERY 6 HOURS AS NEEDED FOR NAUSEA OR VOMITING. 90 tablet 3   QUEtiapine (SEROQUEL) 25 MG tablet TAKE 1 TO 2 TABLETS BY MOUTH NIGHTLY 180 tablet 0   senna-docusate (SENOKOT-S) 8.6-50 MG tablet Take 2 tablets by mouth at bedtime.     TRULICITY 3 TM/2.1RZ SOPN Inject into the skin.     zolpidem (AMBIEN) 5 MG tablet Take 1 tablet (5 mg total) by mouth at bedtime as needed for sleep. 30 tablet 0   No current facility-administered medications for this visit.    Medication  Side Effects: None  Allergies:  Allergies  Allergen Reactions   Leucovorin Hives, Rash and Other (See Comments)    Redness on face and neck.  Confirmed that it was the leucovorin causing this on 11/19/21.  We are deleting this from future plan.   Clindamycin/Lincomycin Rash   Irinotecan Rash   Penicillins Rash    Reaction: 10 years    Past Medical History:  Diagnosis Date   Anemia    Iron De   Anxiety    Blood clot in vein    Bowel obstruction ([place])    Colon cancer ([place]) 2018   treated with surgery and chemotherapy   Depression    Diabetes mellitus without complication ([place])    Type II   DVT (deep venous thrombosis) ([place]) 2019   behind right knee- while she wasa on chemo   GERD (gastroesophageal reflux disease)    History of blood transfusion    History of chemotherapy    History of kidney stones 2012   passed   Hypothyroidism    Obesity     Family History  Problem Relation Age of Onset   Irritable bowel syndrome Mother    Colon polyps Father    Breast cancer Maternal Grandmother    Diabetes Maternal Grandmother    Diabetes Maternal Grandfather  Breast cancer Paternal Grandmother    Colon polyps Maternal Uncle    Stomach cancer Neg Hx    Pancreatic cancer Neg Hx    Esophageal cancer Neg Hx    Colon cancer Neg Hx     Social History   Socioeconomic History   Marital status: Widowed    Spouse name: Not on file   Number of children: Not on file   Years of education: Not on file   Highest education level: Not on file  Occupational History   Occupation: care coordinator   Tobacco Use   Smoking status: Never   Smokeless tobacco: Never  Vaping Use   Vaping Use: Never used  Substance and Sexual Activity   Alcohol use: Yes    Comment: rare   Drug use: Never   Sexual activity: Not Currently  Other Topics Concern   Not on file  Social History Narrative   Not on file   Social Determinants of Health   Financial Resource Strain: Not on file  Food  Insecurity: Not on file  Transportation Needs: Not on file  Physical Activity: Not on file  Stress: Not on file  Social Connections: Not on file  Intimate Partner Violence: Not on file    Past Medical History, Surgical history, Social history, and Family history were reviewed and updated as appropriate.   Please see review of systems for further details on the patient's review from today.   Objective:   Physical Exam:  LMP 02/22/2019 (Approximate)   Physical Exam Constitutional:      General: She is not in acute distress. Musculoskeletal:        General: No deformity.  Neurological:     Mental Status: She is alert and oriented to person, place, and time.     Coordination: Coordination normal.  Psychiatric:        Attention and Perception: Attention and perception normal. She does not perceive auditory or visual hallucinations.        Mood and Affect: Mood is anxious and depressed. Affect is not labile, blunt, angry or inappropriate.        Speech: Speech normal. Speech is not rapid and pressured.        Behavior: Behavior normal.        Thought Content: Thought content normal. Thought content is not paranoid or delusional. Thought content does not include homicidal or suicidal ideation.        Cognition and Memory: Cognition and memory normal.        Judgment: Judgment normal.     Comments: Insight intact Still some anhedonia and depression     Lab Review:     Component Value Date/Time   NA 142 02/20/2022 0000   K 3.7 02/20/2022 0000   CL 106 02/20/2022 0000   CO2 28 (A) 02/20/2022 0000   GLUCOSE 234 (H) 06/07/2020 0525   BUN 7 02/20/2022 0000   CREATININE 1.2 (A) 02/20/2022 0000   CREATININE 0.97 06/07/2020 0525   CALCIUM 8.8 02/20/2022 0000   PROT 5.2 (L) 06/07/2020 0525   ALBUMIN 3.9 02/20/2022 0000   AST 30 02/20/2022 0000   ALT 28 02/20/2022 0000   ALKPHOS 110 02/20/2022 0000   BILITOT 0.3 06/07/2020 0525   GFRNONAA >60 06/07/2020 0525       Component  Value Date/Time   WBC 3.0 02/20/2022 0000   WBC 4.8 06/07/2020 0525   RBC 3.82 (A) 02/20/2022 0000   HGB 12.3 02/20/2022 0000   HCT 37  02/20/2022 0000   PLT 88 (A) 02/20/2022 0000   MCV 94 11/27/2021 0000   MCH 32.6 09/16/2020 0000   MCHC 33 09/16/2020 0000   RDW 13.5 06/07/2020 0525   LYMPHSABS 1.0 06/07/2020 0525   MONOABS 0.4 06/07/2020 0525   EOSABS 0.0 06/07/2020 0525   BASOSABS 0.0 06/07/2020 0525    No results found for: "POCLITH", "LITHIUM"   No results found for: "PHENYTOIN", "PHENOBARB", "VALPROATE", "CBMZ"   .res Assessment: Plan:    Severe recurrent major depression with psychotic features ([place])  Mixed obsessional thoughts and acts  Delayed sleep phase syndrome  Mild cognitive impairment  Neoplastic malignant related fatigue   Overall mood and anxiety are less well managed.  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 In general has handled this well except some trouble going to sleep on occ with anxiety.  Partially better with the increase in Wellbutrin to 450 mg daily.  Try to avoid Xanax if possible BC DDI with Luvox. Ambien 5 prn.  Disc amnesia risk.  Discussed potential metabolic side effects associated with atypical antipsychotics, as well as potential risk for movement side effects. Advised pt to contact office if movement side effects occur.   Counseled patient regarding potential benefits, risks, and side effects of Lamictal to include potential risk of Stevens-Johnson syndrome. Advised patient to stop taking Lamictal and contact office immediately if rash develops and to seek urgent medical attention if rash is severe and/or spreading quickly.  HX RELAPSE PSYCHOSIS WITH GENERIC QUETIAPINE, Continue lamotrigine 150 mg daily  Continue Vraylar 3 mg every other day  Try again to Reduce Seroquel if possible to 0 or  25 mg HS Wants to have access to Ambien Continue Luvox to 2 and 1/2  tablets Continue  Wellbutrin to 450 mg AM residual depression anhedonia OK continue Modafinil to 300 mg daily for excessive sleepiness and cancer related fatigue  Disc insomnia is frequent problem trying to wean off Seroquel.  Disc polypharmacy and will try to eliminate some meds if depression improves.  Disc problems with D and how to intervene getting help for D's mental health and drinking problems.  FU 3-4 mos  Lynder Parents, MD, DFAPA   Please see After Visit Summary for patient specific instructions.  Future Appointments  Date Time Provider [place]  03/13/2022  2:00 PM Lavera Guise A, MD [facility] None  03/17/2022 10:00 AM [facility] INFUSION CHAIR 6 [facility] None  03/19/2022  2:00 PM [facility] INFUSION CHAIR 4 [facility] None  03/24/2022  3:00 PM [facility] INFUSION CHAIR 5 [facility] None  03/25/2022  3:00 PM [facility] INFUSION CHAIR 5 [facility] None  03/26/2022  2:30 PM [facility] INFUSION CHAIR 5 [facility] None  09/07/2022  7:00 AM [facility] BO[facility] CONFERENCE [facility] None  09/08/2022 12:30 PM Vaslow, Acey Lav, MD [facility] None    No orders of the defined types were placed in this encounter.      -------------------------------

## 2022-03-12 DIAGNOSIS — N8189 Other female genital prolapse: Secondary | ICD-10-CM | POA: Diagnosis not present

## 2022-03-12 DIAGNOSIS — C7800 Secondary malignant neoplasm of unspecified lung: Secondary | ICD-10-CM | POA: Diagnosis not present

## 2022-03-12 DIAGNOSIS — E1169 Type 2 diabetes mellitus with other specified complication: Secondary | ICD-10-CM | POA: Diagnosis not present

## 2022-03-12 DIAGNOSIS — R918 Other nonspecific abnormal finding of lung field: Secondary | ICD-10-CM | POA: Diagnosis not present

## 2022-03-12 DIAGNOSIS — C189 Malignant neoplasm of colon, unspecified: Secondary | ICD-10-CM | POA: Diagnosis not present

## 2022-03-12 DIAGNOSIS — I700 Atherosclerosis of aorta: Secondary | ICD-10-CM | POA: Diagnosis not present

## 2022-03-12 DIAGNOSIS — K6389 Other specified diseases of intestine: Secondary | ICD-10-CM | POA: Diagnosis not present

## 2022-03-12 LAB — BASIC METABOLIC PANEL
BUN: 11 (ref 4–21)
CO2: 30 — AB (ref 13–22)
Chloride: 104 (ref 99–108)
Creatinine: 1.3 — AB (ref 0.5–1.1)
Glucose: 81
Potassium: 3.3 mEq/L — AB (ref 3.5–5.1)
Sodium: 143 (ref 137–147)

## 2022-03-12 LAB — COMPREHENSIVE METABOLIC PANEL
Albumin: 4.6 (ref 3.5–5.0)
Calcium: 9.6 (ref 8.7–10.7)

## 2022-03-12 LAB — HEPATIC FUNCTION PANEL
ALT: 26 U/L (ref 7–35)
AST: 30 (ref 13–35)
Alkaline Phosphatase: 156 — AB (ref 25–125)
Bilirubin, Total: 0.5

## 2022-03-12 LAB — CBC AND DIFFERENTIAL
HCT: 40 (ref 36–46)
Hemoglobin: 13.5 (ref 12.0–16.0)
Neutrophils Absolute: 1.92
Platelets: 109 10*3/uL — AB (ref 150–400)
WBC: 3.1

## 2022-03-12 LAB — CBC: RBC: 4.17 (ref 3.87–5.11)

## 2022-03-12 LAB — HEMOGLOBIN A1C: Hemoglobin A1C: 6.3

## 2022-03-12 NOTE — Progress Notes (Signed)
[facility]  [address] [place],  [place]  [address] [phone]  Clinic Day:  03/13/2022  Referring physician: Marice Potter, MD  HISTORY OF PRESENT ILLNESS:  The patient is a 56 y.o. female with  metastatic colon cancer, which includes spread of disease to her abdominal cavity, lungs, brain and right adrenal gland. She comes in today to go over her CT scans to ascertain her new disease baseline after receiving 35 cycles of palliative FOLFIRI/Avastin chemotherapy.  Of note, leucovorin has been discontinued due to an allergic reaction from this agent that did not improve despite being predmedicated with diphenhydramine and famotidine. The patient claims to have tolerated her 35th cycle of treatment okay, but did complain of slightly more nausea than what she has had previously.  Her oral antiemetics have helped curtailing her degree of nausea..  She continues to report intermittent diarrhea, but is using Imodium as needed.  She denies having any new symptoms which concern her for possible disease progression.   With respect to her colon cancer history, she is status post a right hemicolectomy in early October 2018, followed by 12 cycles of adjuvant FOLFOX chemotherapy, which were completed in April 2019.  In December 2021, she underwent a left cerebellar metastasectomy, whose pathology was consistent with metastatic colon cancer.  Tumor testing did come back MMR normal.  CT scans revealed evidence of her cancer being in multiple locations, for which she has been taking FOLFIRI/Avastin ever since.  PHYSICAL EXAM:  Last menstrual period 02/22/2019.  There is no height or weight on file to calculate BMI.  Performance status (ECOG): 1 - Symptomatic but completely ambulatory  Physical Exam Vitals and nursing note reviewed.  Constitutional:      General: She is not in acute distress.    Appearance: Normal appearance.  HENT:     Head: Normocephalic and  atraumatic.     Mouth/Throat:     Mouth: Mucous membranes are moist.     Pharynx: Oropharynx is clear. No oropharyngeal exudate or posterior oropharyngeal erythema.  Eyes:     General: No scleral icterus.    Extraocular Movements: Extraocular movements intact.     Conjunctiva/sclera: Conjunctivae normal.     Pupils: Pupils are equal, round, and reactive to light.  Cardiovascular:     Rate and Rhythm: Normal rate and regular rhythm.     Heart sounds: Normal heart sounds. No murmur heard.    No friction rub. No gallop.  Pulmonary:     Effort: Pulmonary effort is normal.     Breath sounds: Normal breath sounds. No wheezing, rhonchi or rales.  Abdominal:     General: There is no distension.     Palpations: Abdomen is soft. There is no hepatomegaly, splenomegaly or mass.     Tenderness: There is no abdominal tenderness.  Musculoskeletal:        General: Normal range of motion.     Cervical back: Normal range of motion and neck supple. No tenderness.     Right lower leg: No edema.     Left lower leg: No edema.  Lymphadenopathy:     Cervical: No cervical adenopathy.     Upper Body:     Right upper body: No supraclavicular or axillary adenopathy.     Left upper body: No supraclavicular or axillary adenopathy.     Lower Body: No right inguinal adenopathy. No left inguinal adenopathy.  Skin:    General: Skin is warm and dry.  Coloration: Skin is not jaundiced.     Findings: No rash.  Neurological:     Mental Status: She is alert and oriented to person, place, and time.     Cranial Nerves: No cranial nerve deficit.  Psychiatric:        Mood and Affect: Mood normal.        Behavior: Behavior normal.        Thought Content: Thought content normal.   SCANS:  CT scans of her chest/abdomen/pelvis revealed the following: FINDINGS: CT CHEST FINDINGS  Cardiovascular: No significant vascular findings. Right subclavian Port-A-Cath extends to the superior cavoatrial junction. The  heart size is normal. There is no pericardial effusion.  Mediastinum/Nodes: There are no enlarged mediastinal, hilar or axillary lymph nodes. The thyroid gland, trachea and esophagus demonstrate no significant findings.  Lungs/Pleura: There is no pleural effusion. Multiple irregular pulmonary nodules are again demonstrated with interval slight progression. The largest nodule posteriorly in the right upper lobe measures 3.0 x 1.9 cm on image 44/301 (previously 2.4 x 1.7 cm). Subpleural right lower lobe nodule measures 1.5 x 1.3 cm (previously 1.4 x 1.3 cm). There are additional smaller nodules which appear mildly progressive, including a partially cavitary left upper lobe nodule measuring 5 mm on image 30/301.  Musculoskeletal/Chest wall: No chest wall mass or suspicious osseous findings.  CT ABDOMEN AND PELVIS FINDINGS  Hepatobiliary: The liver is normal in density without suspicious focal abnormality. No evidence of gallstones, gallbladder wall thickening or biliary dilatation.  Pancreas: Unremarkable. No pancreatic ductal dilatation or surrounding inflammatory changes.  Spleen: Normal in size without focal abnormality.  Adrenals/Urinary Tract: Both adrenal glands appear normal. No evidence of urinary tract calculus, suspicious renal lesion or hydronephrosis. The bladder appears normal for its degree of distention.  Stomach/Bowel: Enteric contrast was administered and has passed into the proximal colon. The stomach appears unremarkable for its degree of distension. No evidence of bowel wall thickening, distention or surrounding inflammatory change. Stable postsurgical changes from right hemicolectomy. Patent ileocolonic anastomosis.  Vascular/Lymphatic: There are no enlarged abdominal or pelvic lymph nodes. Mild aortic and branch vessel atherosclerosis without evidence of aneurysm or large vessel occlusion.  Reproductive: Hysterectomy. No adnexal mass.  Other:  Postsurgical changes in the anterior abdominal wall. No focal hernia, ascites or peritoneal nodularity. Mild pelvic floor laxity.  Musculoskeletal: No acute or significant osseous findings.  IMPRESSION: 1. Further slight progression pulmonary metastatic disease. 2. No evidence of metastatic disease or local recurrence within the abdomen or pelvis. 3. No acute findings. 4. Aortic Atherosclerosis (ICD10-I70.0).  LABS:      Latest Ref Rng & Units 02/20/2022   12:00 AM 01/30/2022   12:00 AM 01/12/2022   12:00 AM  CBC  WBC  3.0     3.7     3.7      Hemoglobin 12.0 - 16.0 12.3     12.9     12.5      Hematocrit 36 - 46 37     40     38      Platelets 150 - 400 K/uL 88     99     101         This result is from an external source.       Latest Ref Rng & Units 02/20/2022   12:00 AM 01/30/2022   12:00 AM 01/12/2022   12:00 AM  CMP  BUN 4 - _0 13  Creatinine 0.5 - 1.1 1.2     1.2     1.2      Sodium 137 - 147 142     141     139      Potassium 3.5 - 5.1 mEq/L 3.7     4.2     4.2      Chloride 99 - 108 106     104     103      CO2 13 - 22 28     32     29      Calcium 8.7 - 10.7 8.8     9.2     8.9      Alkaline Phos 25 - 125 110     130     129      AST 13 - 35 _0 ALT 7 - 35 U/L _1 This result is from an external source.     ASSESSMENT & PLAN:  Assessment/Plan:  A 56 y.o. female with metastatic colon cancer.  In clinic today, I went over all of her CT scan images with her, for which she could see that some of her pulmonary nodules have minimally increased in size.  There remains no disease in the abdomen, particularly in her liver.  Overall, I do not see a need to change her systemic chemotherapy based upon the minor progression of her lung nodules.  I will discuss her case with radiation oncology to see if the larger nodules would be amenable to stereotactic radiation.  Once again, there remains no obvious metastatic disease  that puts her quality and quantity of life in peril at this time.  She will proceed with her 36th cycle of FOLFIRI/Avastin (without leucovorin) next week, which she will continue to receive every 3 weeks.  I will see her back in 3 weeks before she heads into her 37th cycle of FOLFIRI/Avastin. The patient understands all the plans discussed today and is in agreement with them.    Yosef Krogh Macarthur Critchley, MD

## 2022-03-13 ENCOUNTER — Inpatient Hospital Stay (HOSPITAL_BASED_OUTPATIENT_CLINIC_OR_DEPARTMENT_OTHER): Payer: 59 | Admitting: Oncology

## 2022-03-13 DIAGNOSIS — C182 Malignant neoplasm of ascending colon: Secondary | ICD-10-CM

## 2022-03-13 DIAGNOSIS — C787 Secondary malignant neoplasm of liver and intrahepatic bile duct: Secondary | ICD-10-CM | POA: Diagnosis not present

## 2022-03-13 DIAGNOSIS — C189 Malignant neoplasm of colon, unspecified: Secondary | ICD-10-CM | POA: Diagnosis not present

## 2022-03-13 DIAGNOSIS — C7800 Secondary malignant neoplasm of unspecified lung: Secondary | ICD-10-CM

## 2022-03-13 LAB — TSH: TSH: 5.02

## 2022-03-13 LAB — T4, FREE: Free T4: 1.74

## 2022-03-16 MED ORDER — POTASSIUM CHLORIDE CRYS ER 20 MEQ PO TBCR: 20.0000 meq | EXTENDED_RELEASE_TABLET | Freq: Every day | ORAL | 0 refills | Status: DC

## 2022-03-16 MED FILL — Bevacizumab-awwb IV Soln 400 MG/16ML (For Infusion): INTRAVENOUS | Qty: 16 | Status: AC

## 2022-03-16 MED FILL — Fosaprepitant Dimeglumine For IV Infusion 150 MG (Base Eq): INTRAVENOUS | Qty: 5 | Status: AC

## 2022-03-16 MED FILL — Fluorouracil IV Soln 5 GM/100ML (50 MG/ML): INTRAVENOUS | Qty: 100 | Status: AC

## 2022-03-16 MED FILL — Irinotecan HCl Inj 100 MG/5ML (20 MG/ML): INTRAVENOUS | Qty: 14 | Status: AC

## 2022-03-16 MED FILL — Fluorouracil IV Soln 2.5 GM/50ML (50 MG/ML): INTRAVENOUS | Qty: 16 | Status: AC

## 2022-03-16 MED FILL — Dexamethasone Sodium Phosphate Inj 100 MG/10ML: INTRAMUSCULAR | Qty: 1 | Status: AC

## 2022-03-17 ENCOUNTER — Inpatient Hospital Stay: Payer: 59

## 2022-03-17 ENCOUNTER — Encounter: Payer: Self-pay | Admitting: Oncology

## 2022-03-17 VITALS — BP 122/78 | HR 97 | Temp 97.4°F | Resp 20 | Ht 68.0 in | Wt 169.2 lb

## 2022-03-17 DIAGNOSIS — Z23 Encounter for immunization: Secondary | ICD-10-CM | POA: Diagnosis not present

## 2022-03-17 DIAGNOSIS — Z5111 Encounter for antineoplastic chemotherapy: Secondary | ICD-10-CM | POA: Diagnosis not present

## 2022-03-17 DIAGNOSIS — C182 Malignant neoplasm of ascending colon: Secondary | ICD-10-CM

## 2022-03-17 DIAGNOSIS — Z5189 Encounter for other specified aftercare: Secondary | ICD-10-CM | POA: Diagnosis not present

## 2022-03-17 DIAGNOSIS — C7931 Secondary malignant neoplasm of brain: Secondary | ICD-10-CM | POA: Diagnosis not present

## 2022-03-17 DIAGNOSIS — C7800 Secondary malignant neoplasm of unspecified lung: Secondary | ICD-10-CM

## 2022-03-17 DIAGNOSIS — C189 Malignant neoplasm of colon, unspecified: Secondary | ICD-10-CM

## 2022-03-17 MED ORDER — SODIUM CHLORIDE 0.9 % IV SOLN
150.0000 mg | Freq: Once | INTRAVENOUS | Status: AC
  Administered 2022-03-17: 150 mg via INTRAVENOUS
  Filled 2022-03-17: qty 150

## 2022-03-17 MED ORDER — FAMOTIDINE IN NACL 20-0.9 MG/50ML-% IV SOLN
20.0000 mg | Freq: Once | INTRAVENOUS | Status: AC
  Administered 2022-03-17: 20 mg via INTRAVENOUS
  Filled 2022-03-17: qty 50

## 2022-03-17 MED ORDER — FLUOROURACIL CHEMO INJECTION 2.5 GM/50ML
400.0000 mg/m2 | Freq: Once | INTRAVENOUS | Status: AC
  Administered 2022-03-17: 800 mg via INTRAVENOUS
  Filled 2022-03-17: qty 16

## 2022-03-17 MED ORDER — IRINOTECAN HCL CHEMO INJECTION 100 MG/5ML
144.0000 mg/m2 | Freq: Once | INTRAVENOUS | Status: AC
  Administered 2022-03-17: 280 mg via INTRAVENOUS
  Filled 2022-03-17: qty 10

## 2022-03-17 MED ORDER — SODIUM CHLORIDE 0.9% FLUSH: 10.0000 mL | INTRAVENOUS | Status: DC | PRN

## 2022-03-17 MED ORDER — HEPARIN SOD (PORK) LOCK FLUSH 100 UNIT/ML IV SOLN: 500.0000 [IU] | Freq: Once | INTRAVENOUS | Status: DC | PRN

## 2022-03-17 MED ORDER — ATROPINE SULFATE 1 MG/ML IV SOLN
0.5000 mg | Freq: Once | INTRAVENOUS | Status: AC | PRN
  Administered 2022-03-17: 0.5 mg via INTRAVENOUS
  Filled 2022-03-17: qty 1

## 2022-03-17 MED ORDER — SODIUM CHLORIDE 0.9 % IV SOLN: Freq: Once | INTRAVENOUS | Status: AC

## 2022-03-17 MED ORDER — DIPHENHYDRAMINE HCL 50 MG/ML IJ SOLN
25.0000 mg | Freq: Once | INTRAMUSCULAR | Status: AC
  Administered 2022-03-17: 25 mg via INTRAVENOUS
  Filled 2022-03-17: qty 1

## 2022-03-17 MED ORDER — SODIUM CHLORIDE 0.9 % IV SOLN
2575.0000 mg/m2 | INTRAVENOUS | Status: DC
  Administered 2022-03-17: 5000 mg via INTRAVENOUS
  Filled 2022-03-17: qty 100

## 2022-03-17 MED ORDER — PALONOSETRON HCL INJECTION 0.25 MG/5ML
0.2500 mg | Freq: Once | INTRAVENOUS | Status: AC
  Administered 2022-03-17: 0.25 mg via INTRAVENOUS
  Filled 2022-03-17: qty 5

## 2022-03-17 MED ORDER — SODIUM CHLORIDE 0.9 % IV SOLN: Freq: Once | INTRAVENOUS | Status: DC

## 2022-03-17 MED ORDER — SODIUM CHLORIDE 0.9 % IV SOLN
10.0000 mg | Freq: Once | INTRAVENOUS | Status: AC
  Administered 2022-03-17: 10 mg via INTRAVENOUS
  Filled 2022-03-17: qty 10

## 2022-03-17 MED ORDER — SODIUM CHLORIDE 0.9 % IV SOLN
5.0000 mg/kg | Freq: Once | INTRAVENOUS | Status: AC
  Administered 2022-03-17: 400 mg via INTRAVENOUS
  Filled 2022-03-17: qty 16

## 2022-03-17 NOTE — Patient Instructions (Signed)
The chemotherapy medication bag should finish at 46 hours. For example, if your pump is scheduled for 46 hours and it was put on at 4:00 p.m., it should finish at 2:00 p.m. the day it is scheduled to come off regardless of your appointment time.     Estimated time to finish at 1200.   If the display on your pump reads "Low Volume" and it is beeping, take the batteries out of the pump and come to the cancer center for it to be taken off.   If the pump alarms go off prior to the pump reading "Low Volume" then call [phone] and someone can assist you.  If the plunger comes out and the chemotherapy medication is leaking out, please use your home chemo spill kit to clean up the spill. Do NOT use paper towels or other household products.  If you have problems or questions regarding your pump, please call either [phone] (24 hours a day) or the cancer center Monday-Friday 8:00 a.m.- 4:30 p.m. at the clinic number and we will assist you. If you are unable to get assistance, then go to the nearest Emergency Department and ask the staff to contact the IV team for assistance.   

## 2022-03-17 NOTE — Progress Notes (Signed)
Patient informed that potassium RX sent to [facility]

## 2022-03-18 ENCOUNTER — Telehealth: Payer: Self-pay

## 2022-03-18 DIAGNOSIS — C182 Malignant neoplasm of ascending colon: Secondary | ICD-10-CM | POA: Diagnosis not present

## 2022-03-18 NOTE — Telephone Encounter (Signed)
Prior Authorization submitted for MODAFINIL 200 MG Tablets with [employer], pending response

## 2022-03-18 NOTE — Telephone Encounter (Signed)
Prior Approval received effective 03/18/2022-03/18/2023 with [employer] for MODAFINIL 200 MG TABS, no more than #60/30 day.

## 2022-03-19 ENCOUNTER — Inpatient Hospital Stay: Payer: 59

## 2022-03-19 VITALS — BP 98/67 | HR 92 | Temp 97.7°F | Resp 20

## 2022-03-19 DIAGNOSIS — Z5189 Encounter for other specified aftercare: Secondary | ICD-10-CM | POA: Diagnosis not present

## 2022-03-19 DIAGNOSIS — C182 Malignant neoplasm of ascending colon: Secondary | ICD-10-CM

## 2022-03-19 DIAGNOSIS — Z5111 Encounter for antineoplastic chemotherapy: Secondary | ICD-10-CM | POA: Diagnosis not present

## 2022-03-19 DIAGNOSIS — Z23 Encounter for immunization: Secondary | ICD-10-CM | POA: Diagnosis not present

## 2022-03-19 DIAGNOSIS — C189 Malignant neoplasm of colon, unspecified: Secondary | ICD-10-CM

## 2022-03-19 DIAGNOSIS — C7931 Secondary malignant neoplasm of brain: Secondary | ICD-10-CM | POA: Diagnosis not present

## 2022-03-19 MED ORDER — HEPARIN SOD (PORK) LOCK FLUSH 100 UNIT/ML IV SOLN
500.0000 [IU] | Freq: Once | INTRAVENOUS | Status: AC | PRN
  Administered 2022-03-19: 500 [IU]

## 2022-03-19 MED ORDER — SODIUM CHLORIDE 0.9% FLUSH
10.0000 mL | INTRAVENOUS | Status: DC | PRN
  Administered 2022-03-19: 10 mL

## 2022-03-24 ENCOUNTER — Inpatient Hospital Stay: Payer: 59

## 2022-03-24 VITALS — BP 102/81 | HR 107 | Temp 98.3°F | Resp 18 | Ht 68.0 in | Wt 161.1 lb

## 2022-03-24 DIAGNOSIS — C7800 Secondary malignant neoplasm of unspecified lung: Secondary | ICD-10-CM

## 2022-03-24 DIAGNOSIS — C7931 Secondary malignant neoplasm of brain: Secondary | ICD-10-CM | POA: Diagnosis not present

## 2022-03-24 DIAGNOSIS — Z5189 Encounter for other specified aftercare: Secondary | ICD-10-CM | POA: Diagnosis not present

## 2022-03-24 DIAGNOSIS — Z5111 Encounter for antineoplastic chemotherapy: Secondary | ICD-10-CM | POA: Diagnosis not present

## 2022-03-24 DIAGNOSIS — Z23 Encounter for immunization: Secondary | ICD-10-CM | POA: Diagnosis not present

## 2022-03-24 DIAGNOSIS — C787 Secondary malignant neoplasm of liver and intrahepatic bile duct: Secondary | ICD-10-CM

## 2022-03-24 DIAGNOSIS — C182 Malignant neoplasm of ascending colon: Secondary | ICD-10-CM

## 2022-03-24 MED ORDER — FILGRASTIM-SNDZ 480 MCG/0.8ML IJ SOSY
480.0000 ug | PREFILLED_SYRINGE | Freq: Once | INTRAMUSCULAR | Status: AC
  Administered 2022-03-24: 480 ug via SUBCUTANEOUS
  Filled 2022-03-24: qty 0.8

## 2022-03-24 NOTE — Patient Instructions (Signed)
Filgrastim Injection What is this medication? FILGRASTIM (fil GRA stim) lowers the risk of infection in people who are receiving chemotherapy. It works by helping your body make more white blood cells, which protects your body from infection. It may also be used to help people who have been exposed to high doses of radiation. It can be used to help prepare your body before a stem cell transplant. It works by helping your bone marrow make and release stem cells into the blood. This medicine may be used for other purposes; ask your health care provider or pharmacist if you have questions. COMMON BRAND NAME(S): Neupogen, Nivestym, Releuko, Zarxio What should I tell my care team before I take this medication? They need to know if you have any of these conditions: History of blood diseases, such as sickle cell anemia Kidney disease Recent or ongoing radiation An unusual or allergic reaction to filgrastim, pegfilgrastim, latex, rubber, other medications, foods, dyes, or preservatives Pregnant or trying to get pregnant Breast-feeding How should I use this medication? This medication is injected under the skin or into a vein. It is usually given by your care team in a hospital or clinic setting. It may be given at home. If you get this medication at home, you will be taught how to prepare and give it. Use exactly as directed. Take it as directed on the prescription label at the same time every day. Keep taking it unless your care team tells you to stop. It is important that you put your used needles and syringes in a special sharps container. Do not put them in a trash can. If you do not have a sharps container, call your pharmacist or care team to get one. This medication comes with INSTRUCTIONS FOR USE. Ask your pharmacist for directions on how to use this medication. Read the information carefully. Talk to your pharmacist or care team if you have questions. Talk to your care team about the use of this  medication in children. While it may be prescribed for children for selected conditions, precautions do apply. Overdosage: If you think you have taken too much of this medicine contact a poison control center or emergency room at once. NOTE: This medicine is only for you. Do not share this medicine with others. What if I miss a dose? It is important not to miss any doses. Talk to your care team about what to do if you miss a dose. What may interact with this medication? Medications that may cause a release of neutrophils, such as lithium This list may not describe all possible interactions. Give your health care provider a list of all the medicines, herbs, non-prescription drugs, or dietary supplements you use. Also tell them if you smoke, drink alcohol, or use illegal drugs. Some items may interact with your medicine. What should I watch for while using this medication? Your condition will be monitored carefully while you are receiving this medication. You may need bloodwork while taking this medication. Talk to your care team about your risk of cancer. You may be more at risk for certain types of cancer if you take this medication. What side effects may I notice from receiving this medication? Side effects that you should report to your care team as soon as possible: Allergic reactions--skin rash, itching, hives, swelling of the face, lips, tongue, or throat Capillary leak syndrome--stomach or muscle pain, unusual weakness or fatigue, feeling faint or lightheaded, decrease in the amount of urine, swelling of the ankles, hands, or   feet, trouble breathing High white blood cell level--fever, fatigue, trouble breathing, night sweats, change in vision, weight loss Inflammation of the aorta--fever, fatigue, back, chest, or stomach pain, severe headache Kidney injury (glomerulonephritis)--decrease in the amount of urine, red or dark brown urine, foamy or bubbly urine, swelling of the ankles, hands, or  feet Shortness of breath or trouble breathing Spleen injury--pain in upper left stomach or shoulder Unusual bruising or bleeding Side effects that usually do not require medical attention (report to your care team if they continue or are bothersome): Back pain Bone pain Fatigue Fever Headache Nausea This list may not describe all possible side effects. Call your doctor for medical advice about side effects. You may report side effects to FDA at 1-800-FDA-1088. Where should I keep my medication? Keep out of the reach of children and pets. Keep this medication in the original packaging until you are ready to take it. Protect from light. See product for storage information. Each product may have different instructions. Get rid of any unused medication after the expiration date. To get rid of medications that are no longer needed or have expired: Take the medication to a medications take-back program. Check with your pharmacy or law enforcement to find a location. If you cannot return the medication, ask your pharmacist or care team how to get rid of this medication safely. NOTE: This sheet is a summary. It may not cover all possible information. If you have questions about this medicine, talk to your doctor, pharmacist, or health care provider.  2023 Elsevier/Gold Standard (2021-08-26 00:00:00)  

## 2022-03-25 ENCOUNTER — Inpatient Hospital Stay: Payer: 59

## 2022-03-25 VITALS — BP 108/89 | HR 103 | Temp 97.8°F | Resp 20 | Ht 68.0 in | Wt 162.8 lb

## 2022-03-25 DIAGNOSIS — C182 Malignant neoplasm of ascending colon: Secondary | ICD-10-CM

## 2022-03-25 DIAGNOSIS — Z23 Encounter for immunization: Secondary | ICD-10-CM | POA: Diagnosis not present

## 2022-03-25 DIAGNOSIS — C7931 Secondary malignant neoplasm of brain: Secondary | ICD-10-CM | POA: Diagnosis not present

## 2022-03-25 DIAGNOSIS — C7800 Secondary malignant neoplasm of unspecified lung: Secondary | ICD-10-CM

## 2022-03-25 DIAGNOSIS — Z5189 Encounter for other specified aftercare: Secondary | ICD-10-CM | POA: Diagnosis not present

## 2022-03-25 DIAGNOSIS — C189 Malignant neoplasm of colon, unspecified: Secondary | ICD-10-CM

## 2022-03-25 DIAGNOSIS — Z5111 Encounter for antineoplastic chemotherapy: Secondary | ICD-10-CM | POA: Diagnosis not present

## 2022-03-25 MED ORDER — FILGRASTIM-SNDZ 480 MCG/0.8ML IJ SOSY
480.0000 ug | PREFILLED_SYRINGE | Freq: Once | INTRAMUSCULAR | Status: AC
  Administered 2022-03-25: 480 ug via SUBCUTANEOUS
  Filled 2022-03-25: qty 0.8

## 2022-03-25 NOTE — Patient Instructions (Signed)
Filgrastim Injection What is this medication? FILGRASTIM (fil GRA stim) lowers the risk of infection in people who are receiving chemotherapy. It works by helping your body make more white blood cells, which protects your body from infection. It may also be used to help people who have been exposed to high doses of radiation. It can be used to help prepare your body before a stem cell transplant. It works by helping your bone marrow make and release stem cells into the blood. This medicine may be used for other purposes; ask your health care provider or pharmacist if you have questions. COMMON BRAND NAME(S): Neupogen, Nivestym, Releuko, Zarxio What should I tell my care team before I take this medication? They need to know if you have any of these conditions: History of blood diseases, such as sickle cell anemia Kidney disease Recent or ongoing radiation An unusual or allergic reaction to filgrastim, pegfilgrastim, latex, rubber, other medications, foods, dyes, or preservatives Pregnant or trying to get pregnant Breast-feeding How should I use this medication? This medication is injected under the skin or into a vein. It is usually given by your care team in a hospital or clinic setting. It may be given at home. If you get this medication at home, you will be taught how to prepare and give it. Use exactly as directed. Take it as directed on the prescription label at the same time every day. Keep taking it unless your care team tells you to stop. It is important that you put your used needles and syringes in a special sharps container. Do not put them in a trash can. If you do not have a sharps container, call your pharmacist or care team to get one. This medication comes with INSTRUCTIONS FOR USE. Ask your pharmacist for directions on how to use this medication. Read the information carefully. Talk to your pharmacist or care team if you have questions. Talk to your care team about the use of this  medication in children. While it may be prescribed for children for selected conditions, precautions do apply. Overdosage: If you think you have taken too much of this medicine contact a poison control center or emergency room at once. NOTE: This medicine is only for you. Do not share this medicine with others. What if I miss a dose? It is important not to miss any doses. Talk to your care team about what to do if you miss a dose. What may interact with this medication? Medications that may cause a release of neutrophils, such as lithium This list may not describe all possible interactions. Give your health care provider a list of all the medicines, herbs, non-prescription drugs, or dietary supplements you use. Also tell them if you smoke, drink alcohol, or use illegal drugs. Some items may interact with your medicine. What should I watch for while using this medication? Your condition will be monitored carefully while you are receiving this medication. You may need bloodwork while taking this medication. Talk to your care team about your risk of cancer. You may be more at risk for certain types of cancer if you take this medication. What side effects may I notice from receiving this medication? Side effects that you should report to your care team as soon as possible: Allergic reactions--skin rash, itching, hives, swelling of the face, lips, tongue, or throat Capillary leak syndrome--stomach or muscle pain, unusual weakness or fatigue, feeling faint or lightheaded, decrease in the amount of urine, swelling of the ankles, hands, or   feet, trouble breathing High white blood cell level--fever, fatigue, trouble breathing, night sweats, change in vision, weight loss Inflammation of the aorta--fever, fatigue, back, chest, or stomach pain, severe headache Kidney injury (glomerulonephritis)--decrease in the amount of urine, red or dark brown urine, foamy or bubbly urine, swelling of the ankles, hands, or  feet Shortness of breath or trouble breathing Spleen injury--pain in upper left stomach or shoulder Unusual bruising or bleeding Side effects that usually do not require medical attention (report to your care team if they continue or are bothersome): Back pain Bone pain Fatigue Fever Headache Nausea This list may not describe all possible side effects. Call your doctor for medical advice about side effects. You may report side effects to FDA at 1-800-FDA-1088. Where should I keep my medication? Keep out of the reach of children and pets. Keep this medication in the original packaging until you are ready to take it. Protect from light. See product for storage information. Each product may have different instructions. Get rid of any unused medication after the expiration date. To get rid of medications that are no longer needed or have expired: Take the medication to a medications take-back program. Check with your pharmacy or law enforcement to find a location. If you cannot return the medication, ask your pharmacist or care team how to get rid of this medication safely. NOTE: This sheet is a summary. It may not cover all possible information. If you have questions about this medicine, talk to your doctor, pharmacist, or health care provider.  2023 Elsevier/Gold Standard (2021-08-26 00:00:00)  

## 2022-03-26 ENCOUNTER — Inpatient Hospital Stay: Payer: 59

## 2022-03-26 VITALS — BP 105/91 | HR 91 | Temp 97.6°F | Resp 18

## 2022-03-26 DIAGNOSIS — Z5189 Encounter for other specified aftercare: Secondary | ICD-10-CM | POA: Diagnosis not present

## 2022-03-26 DIAGNOSIS — Z23 Encounter for immunization: Secondary | ICD-10-CM | POA: Diagnosis not present

## 2022-03-26 DIAGNOSIS — C182 Malignant neoplasm of ascending colon: Secondary | ICD-10-CM

## 2022-03-26 DIAGNOSIS — Z5111 Encounter for antineoplastic chemotherapy: Secondary | ICD-10-CM | POA: Diagnosis not present

## 2022-03-26 DIAGNOSIS — C189 Malignant neoplasm of colon, unspecified: Secondary | ICD-10-CM

## 2022-03-26 DIAGNOSIS — C7931 Secondary malignant neoplasm of brain: Secondary | ICD-10-CM | POA: Diagnosis not present

## 2022-03-26 MED ORDER — FILGRASTIM-SNDZ 480 MCG/0.8ML IJ SOSY
480.0000 ug | PREFILLED_SYRINGE | Freq: Once | INTRAMUSCULAR | Status: AC
  Administered 2022-03-26: 480 ug via SUBCUTANEOUS
  Filled 2022-03-26: qty 0.8

## 2022-03-26 NOTE — Patient Instructions (Signed)
Filgrastim Injection What is this medication? FILGRASTIM (fil GRA stim) lowers the risk of infection in people who are receiving chemotherapy. It works by helping your body make more white blood cells, which protects your body from infection. It may also be used to help people who have been exposed to high doses of radiation. It can be used to help prepare your body before a stem cell transplant. It works by helping your bone marrow make and release stem cells into the blood. This medicine may be used for other purposes; ask your health care provider or pharmacist if you have questions. COMMON BRAND NAME(S): Neupogen, Nivestym, Releuko, Zarxio What should I tell my care team before I take this medication? They need to know if you have any of these conditions: History of blood diseases, such as sickle cell anemia Kidney disease Recent or ongoing radiation An unusual or allergic reaction to filgrastim, pegfilgrastim, latex, rubber, other medications, foods, dyes, or preservatives Pregnant or trying to get pregnant Breast-feeding How should I use this medication? This medication is injected under the skin or into a vein. It is usually given by your care team in a hospital or clinic setting. It may be given at home. If you get this medication at home, you will be taught how to prepare and give it. Use exactly as directed. Take it as directed on the prescription label at the same time every day. Keep taking it unless your care team tells you to stop. It is important that you put your used needles and syringes in a special sharps container. Do not put them in a trash can. If you do not have a sharps container, call your pharmacist or care team to get one. This medication comes with INSTRUCTIONS FOR USE. Ask your pharmacist for directions on how to use this medication. Read the information carefully. Talk to your pharmacist or care team if you have questions. Talk to your care team about the use of this  medication in children. While it may be prescribed for children for selected conditions, precautions do apply. Overdosage: If you think you have taken too much of this medicine contact a poison control center or emergency room at once. NOTE: This medicine is only for you. Do not share this medicine with others. What if I miss a dose? It is important not to miss any doses. Talk to your care team about what to do if you miss a dose. What may interact with this medication? Medications that may cause a release of neutrophils, such as lithium This list may not describe all possible interactions. Give your health care provider a list of all the medicines, herbs, non-prescription drugs, or dietary supplements you use. Also tell them if you smoke, drink alcohol, or use illegal drugs. Some items may interact with your medicine. What should I watch for while using this medication? Your condition will be monitored carefully while you are receiving this medication. You may need bloodwork while taking this medication. Talk to your care team about your risk of cancer. You may be more at risk for certain types of cancer if you take this medication. What side effects may I notice from receiving this medication? Side effects that you should report to your care team as soon as possible: Allergic reactions--skin rash, itching, hives, swelling of the face, lips, tongue, or throat Capillary leak syndrome--stomach or muscle pain, unusual weakness or fatigue, feeling faint or lightheaded, decrease in the amount of urine, swelling of the ankles, hands, or   feet, trouble breathing High white blood cell level--fever, fatigue, trouble breathing, night sweats, change in vision, weight loss Inflammation of the aorta--fever, fatigue, back, chest, or stomach pain, severe headache Kidney injury (glomerulonephritis)--decrease in the amount of urine, red or dark brown urine, foamy or bubbly urine, swelling of the ankles, hands, or  feet Shortness of breath or trouble breathing Spleen injury--pain in upper left stomach or shoulder Unusual bruising or bleeding Side effects that usually do not require medical attention (report to your care team if they continue or are bothersome): Back pain Bone pain Fatigue Fever Headache Nausea This list may not describe all possible side effects. Call your doctor for medical advice about side effects. You may report side effects to FDA at 1-800-FDA-1088. Where should I keep my medication? Keep out of the reach of children and pets. Keep this medication in the original packaging until you are ready to take it. Protect from light. See product for storage information. Each product may have different instructions. Get rid of any unused medication after the expiration date. To get rid of medications that are no longer needed or have expired: Take the medication to a medications take-back program. Check with your pharmacy or law enforcement to find a location. If you cannot return the medication, ask your pharmacist or care team how to get rid of this medication safely. NOTE: This sheet is a summary. It may not cover all possible information. If you have questions about this medicine, talk to your doctor, pharmacist, or health care provider.  2023 Elsevier/Gold Standard (2021-08-26 00:00:00)  

## 2022-03-31 ENCOUNTER — Other Ambulatory Visit: Payer: Self-pay

## 2022-03-31 ENCOUNTER — Telehealth: Payer: Self-pay | Admitting: Psychiatry

## 2022-03-31 DIAGNOSIS — G4721 Circadian rhythm sleep disorder, delayed sleep phase type: Secondary | ICD-10-CM

## 2022-03-31 MED ORDER — LORAZEPAM 0.5 MG PO TABS: 0.5000 mg | ORAL_TABLET | Freq: Three times a day (TID) | ORAL | 3 refills | Status: DC | PRN

## 2022-03-31 MED ORDER — ZOLPIDEM TARTRATE 5 MG PO TABS: 5.0000 mg | ORAL_TABLET | Freq: Every evening | ORAL | 3 refills | Status: DC | PRN

## 2022-03-31 NOTE — Telephone Encounter (Signed)
Pt called and said that she needs new scripts on her ambien and ativan. [facility] on [address] [employer]

## 2022-03-31 NOTE — Telephone Encounter (Signed)
Pended.

## 2022-04-01 NOTE — Progress Notes (Signed)
[facility]  [address] [place],  [place]  [address] [phone]  Clinic Day:  04/02/2022  Referring physician: Marice Potter, MD  HISTORY OF PRESENT ILLNESS:  The patient is a 56 y.o. female with  metastatic colon cancer, which includes spread of disease to her abdominal cavity, lungs, brain and right adrenal gland. She comes in today to be evaluated before heading into her 37th cycle of palliative FOLFIRI/Avastin chemotherapy.  Of note, leucovorin has been discontinued due to an allergic reaction from this agent that did not improve despite being predmedicated with diphenhydramine and famotidine. The patient claims to have tolerated her 36th cycle of treatment okay.  She continues to report intermittent diarrhea, but is using Imodium as needed.  She also has had intermittent nausea, but it is managed with oral antiemetics.  She denies having any new symptoms which concern her for possible disease progression.   With respect to her colon cancer history, she is status post a right hemicolectomy in early October 2018, followed by 12 cycles of adjuvant FOLFOX chemotherapy, which were completed in April 2019.  In December 2021, she underwent a left cerebellar metastasectomy, whose pathology was consistent with metastatic colon cancer.  Tumor testing did come back MMR normal.  CT scans revealed evidence of her cancer being in multiple locations, for which she has been taking FOLFIRI/Avastin ever since.  PHYSICAL EXAM:  Blood pressure 110/83, pulse 93, temperature 97.8 F (36.6 C), resp. rate 16, height _0  (1.727 m), weight 163 lb 14.4 oz (74.3 kg), last menstrual period 02/22/2019, SpO2 99 %.  Body mass index is 24.92 kg/m.  Performance status (ECOG): 1 - Symptomatic but completely ambulatory  Physical Exam Vitals and nursing note reviewed.  Constitutional:      General: She is not in acute distress.    Appearance: Normal appearance.  HENT:      Head: Normocephalic and atraumatic.     Mouth/Throat:     Mouth: Mucous membranes are moist.     Pharynx: Oropharynx is clear. No oropharyngeal exudate or posterior oropharyngeal erythema.  Eyes:     General: No scleral icterus.    Extraocular Movements: Extraocular movements intact.     Conjunctiva/sclera: Conjunctivae normal.     Pupils: Pupils are equal, round, and reactive to light.  Cardiovascular:     Rate and Rhythm: Normal rate and regular rhythm.     Heart sounds: Normal heart sounds. No murmur heard.    No friction rub. No gallop.  Pulmonary:     Effort: Pulmonary effort is normal.     Breath sounds: Normal breath sounds. No wheezing, rhonchi or rales.  Abdominal:     General: There is no distension.     Palpations: Abdomen is soft. There is no hepatomegaly, splenomegaly or mass.     Tenderness: There is no abdominal tenderness.  Musculoskeletal:        General: Normal range of motion.     Cervical back: Normal range of motion and neck supple. No tenderness.     Right lower leg: No edema.     Left lower leg: No edema.  Lymphadenopathy:     Cervical: No cervical adenopathy.     Upper Body:     Right upper body: No supraclavicular or axillary adenopathy.     Left upper body: No supraclavicular or axillary adenopathy.     Lower Body: No right inguinal adenopathy. No left inguinal adenopathy.  Skin:    General:  Skin is warm and dry.     Coloration: Skin is not jaundiced.     Findings: No rash.  Neurological:     Mental Status: She is alert and oriented to person, place, and time.     Cranial Nerves: No cranial nerve deficit.  Psychiatric:        Mood and Affect: Mood normal.        Behavior: Behavior normal.        Thought Content: Thought content normal.    LABS:      Latest Ref Rng & Units 04/02/2022   12:00 AM 03/12/2022   12:00 AM 02/20/2022   12:00 AM  CBC  WBC  2.3     3.1     3.0      Hemoglobin 12.0 - 16.0 12.8     13.5     12.3      Hematocrit 36 -  46 39     40     37      Platelets 150 - 400 K/uL 92     109     88         This result is from an external source.      Latest Ref Rng & Units 04/02/2022   12:00 AM 03/12/2022   12:00 AM 02/20/2022   12:00 AM  CMP  BUN 4 - _0 Creatinine 0.5 - 1.1 1.3     1.3     1.2      Sodium 137 - 147 141     143     142      Potassium 3.5 - 5.1 mEq/L 3.7     3.3     3.7      Chloride 99 - 108 105     104     106      CO2 13 - _1 Calcium 8.7 - 10.7 9.3     9.6     8.8      Alkaline Phos 25 - 125 134     156     110      AST 13 - 35 33     30     30      ALT 7 - 35 U/L _2 This result is from an external source.   ASSESSMENT & PLAN:  Assessment/Plan:  A 56 y.o. female with metastatic colon cancer.  She will proceed with her 37th cycle of FOLFIRI/Avastin (without leucovorin) next week, which she will continue to receive every 3 weeks.  I will see her back in 3 weeks before she heads into her 38th cycle of FOLFIRI/Avastin. The patient understands all the plans discussed today and is in agreement with them.    Teia Freitas Macarthur Critchley, MD

## 2022-04-02 ENCOUNTER — Inpatient Hospital Stay: Payer: 59 | Attending: Oncology

## 2022-04-02 ENCOUNTER — Inpatient Hospital Stay (HOSPITAL_BASED_OUTPATIENT_CLINIC_OR_DEPARTMENT_OTHER): Payer: 59 | Admitting: Oncology

## 2022-04-02 DIAGNOSIS — C7800 Secondary malignant neoplasm of unspecified lung: Secondary | ICD-10-CM | POA: Diagnosis not present

## 2022-04-02 DIAGNOSIS — C786 Secondary malignant neoplasm of retroperitoneum and peritoneum: Secondary | ICD-10-CM | POA: Insufficient documentation

## 2022-04-02 DIAGNOSIS — C787 Secondary malignant neoplasm of liver and intrahepatic bile duct: Secondary | ICD-10-CM | POA: Diagnosis not present

## 2022-04-02 DIAGNOSIS — C182 Malignant neoplasm of ascending colon: Secondary | ICD-10-CM

## 2022-04-02 DIAGNOSIS — C189 Malignant neoplasm of colon, unspecified: Secondary | ICD-10-CM | POA: Diagnosis not present

## 2022-04-02 DIAGNOSIS — Z5189 Encounter for other specified aftercare: Secondary | ICD-10-CM | POA: Insufficient documentation

## 2022-04-02 DIAGNOSIS — Z5111 Encounter for antineoplastic chemotherapy: Secondary | ICD-10-CM | POA: Insufficient documentation

## 2022-04-02 DIAGNOSIS — C7801 Secondary malignant neoplasm of right lung: Secondary | ICD-10-CM | POA: Insufficient documentation

## 2022-04-02 DIAGNOSIS — C7802 Secondary malignant neoplasm of left lung: Secondary | ICD-10-CM | POA: Insufficient documentation

## 2022-04-02 DIAGNOSIS — C7931 Secondary malignant neoplasm of brain: Secondary | ICD-10-CM | POA: Insufficient documentation

## 2022-04-02 DIAGNOSIS — C7971 Secondary malignant neoplasm of right adrenal gland: Secondary | ICD-10-CM | POA: Insufficient documentation

## 2022-04-02 LAB — CBC AND DIFFERENTIAL
HCT: 39 (ref 36–46)
Hemoglobin: 12.8 (ref 12.0–16.0)
Neutrophils Absolute: 1.1
Platelets: 92 10*3/uL — AB (ref 150–400)
WBC: 2.3

## 2022-04-02 LAB — BASIC METABOLIC PANEL
BUN: 11 (ref 4–21)
CO2: 28 — AB (ref 13–22)
Chloride: 105 (ref 99–108)
Creatinine: 1.3 — AB (ref 0.5–1.1)
Glucose: 75
Potassium: 3.7 mEq/L (ref 3.5–5.1)
Sodium: 141 (ref 137–147)

## 2022-04-02 LAB — HEPATIC FUNCTION PANEL
ALT: 27 U/L (ref 7–35)
AST: 33 (ref 13–35)
Alkaline Phosphatase: 134 — AB (ref 25–125)
Bilirubin, Total: 0.4

## 2022-04-02 LAB — COMPREHENSIVE METABOLIC PANEL
Albumin: 4.2 (ref 3.5–5.0)
Calcium: 9.3 (ref 8.7–10.7)

## 2022-04-02 LAB — CBC: RBC: 4.01 (ref 3.87–5.11)

## 2022-04-03 ENCOUNTER — Ambulatory Visit: Payer: 59 | Admitting: Oncology

## 2022-04-03 ENCOUNTER — Other Ambulatory Visit: Payer: 59

## 2022-04-03 DIAGNOSIS — E876 Hypokalemia: Secondary | ICD-10-CM

## 2022-04-05 ENCOUNTER — Encounter: Payer: Self-pay | Admitting: Oncology

## 2022-04-06 ENCOUNTER — Telehealth: Payer: Self-pay | Admitting: Oncology

## 2022-04-06 ENCOUNTER — Encounter: Payer: Self-pay | Admitting: Oncology

## 2022-04-06 ENCOUNTER — Other Ambulatory Visit: Payer: Self-pay | Admitting: Pharmacist

## 2022-04-06 DIAGNOSIS — C182 Malignant neoplasm of ascending colon: Secondary | ICD-10-CM

## 2022-04-06 DIAGNOSIS — C7800 Secondary malignant neoplasm of unspecified lung: Secondary | ICD-10-CM

## 2022-04-06 DIAGNOSIS — C189 Malignant neoplasm of colon, unspecified: Secondary | ICD-10-CM

## 2022-04-06 DIAGNOSIS — C787 Secondary malignant neoplasm of liver and intrahepatic bile duct: Secondary | ICD-10-CM

## 2022-04-06 MED FILL — Dexamethasone Sodium Phosphate Inj 100 MG/10ML: INTRAMUSCULAR | Qty: 1 | Status: AC

## 2022-04-06 MED FILL — Fluorouracil IV Soln 2.5 GM/50ML (50 MG/ML): INTRAVENOUS | Qty: 16 | Status: AC

## 2022-04-06 MED FILL — Fluorouracil IV Soln 5 GM/100ML (50 MG/ML): INTRAVENOUS | Qty: 100 | Status: AC

## 2022-04-06 MED FILL — Irinotecan HCl Inj 100 MG/5ML (20 MG/ML): INTRAVENOUS | Qty: 14 | Status: AC

## 2022-04-06 MED FILL — Bevacizumab-awwb IV Soln 400 MG/16ML (For Infusion): INTRAVENOUS | Qty: 16 | Status: AC

## 2022-04-06 MED FILL — Fosaprepitant Dimeglumine For IV Infusion 150 MG (Base Eq): INTRAVENOUS | Qty: 5 | Status: AC

## 2022-04-06 NOTE — Telephone Encounter (Signed)
Appt scheduled, lvm notifying pt.   Scheduling Message Entered by Juanetta Beets on 04/06/2022 at 12:19 PM Priority: Routine <No visit type provided>  Department: [facility]  Provider:  Appointment Notes:  Please schedule pt for another zarxio injection on 11/17 due to low blood counts from last week.  Scheduling Notes:

## 2022-04-06 NOTE — Progress Notes (Signed)
Ok to proceed with chemo on 11/7 despite plt=92 amd ANC=1100 per Dr. Bobby Rumpf.  Pt will receive an additional zarxio injection after this cycle to help bolster her counts.

## 2022-04-07 ENCOUNTER — Inpatient Hospital Stay: Payer: 59

## 2022-04-07 VITALS — BP 131/81 | HR 85 | Temp 97.6°F | Resp 18 | Wt 164.0 lb

## 2022-04-07 DIAGNOSIS — C7801 Secondary malignant neoplasm of right lung: Secondary | ICD-10-CM | POA: Diagnosis not present

## 2022-04-07 DIAGNOSIS — C189 Malignant neoplasm of colon, unspecified: Secondary | ICD-10-CM

## 2022-04-07 DIAGNOSIS — C786 Secondary malignant neoplasm of retroperitoneum and peritoneum: Secondary | ICD-10-CM | POA: Diagnosis not present

## 2022-04-07 DIAGNOSIS — C7802 Secondary malignant neoplasm of left lung: Secondary | ICD-10-CM | POA: Diagnosis not present

## 2022-04-07 DIAGNOSIS — C182 Malignant neoplasm of ascending colon: Secondary | ICD-10-CM | POA: Diagnosis not present

## 2022-04-07 DIAGNOSIS — C7931 Secondary malignant neoplasm of brain: Secondary | ICD-10-CM | POA: Diagnosis not present

## 2022-04-07 DIAGNOSIS — Z5189 Encounter for other specified aftercare: Secondary | ICD-10-CM | POA: Diagnosis not present

## 2022-04-07 DIAGNOSIS — C7971 Secondary malignant neoplasm of right adrenal gland: Secondary | ICD-10-CM | POA: Diagnosis not present

## 2022-04-07 DIAGNOSIS — Z5111 Encounter for antineoplastic chemotherapy: Secondary | ICD-10-CM | POA: Diagnosis not present

## 2022-04-07 LAB — TOTAL PROTEIN, URINE DIPSTICK: Protein, ur: NEGATIVE mg/dL

## 2022-04-07 MED ORDER — SODIUM CHLORIDE 0.9 % IV SOLN
150.0000 mg | Freq: Once | INTRAVENOUS | Status: AC
  Administered 2022-04-07: 150 mg via INTRAVENOUS
  Filled 2022-04-07: qty 150

## 2022-04-07 MED ORDER — SODIUM CHLORIDE 0.9 % IV SOLN
2575.0000 mg/m2 | INTRAVENOUS | Status: DC
  Administered 2022-04-07: 5000 mg via INTRAVENOUS
  Filled 2022-04-07: qty 100

## 2022-04-07 MED ORDER — ATROPINE SULFATE 1 MG/ML IV SOLN
0.5000 mg | Freq: Once | INTRAVENOUS | Status: AC | PRN
  Administered 2022-04-07: 0.5 mg via INTRAVENOUS
  Filled 2022-04-07: qty 1

## 2022-04-07 MED ORDER — IRINOTECAN HCL CHEMO INJECTION 100 MG/5ML
144.0000 mg/m2 | Freq: Once | INTRAVENOUS | Status: AC
  Administered 2022-04-07: 280 mg via INTRAVENOUS
  Filled 2022-04-07: qty 10

## 2022-04-07 MED ORDER — HEPARIN SOD (PORK) LOCK FLUSH 100 UNIT/ML IV SOLN: 500.0000 [IU] | Freq: Once | INTRAVENOUS | Status: DC | PRN

## 2022-04-07 MED ORDER — SODIUM CHLORIDE 0.9 % IV SOLN
10.0000 mg | Freq: Once | INTRAVENOUS | Status: AC
  Administered 2022-04-07: 10 mg via INTRAVENOUS
  Filled 2022-04-07: qty 1

## 2022-04-07 MED ORDER — LORAZEPAM 2 MG/ML IJ SOLN
1.0000 mg | Freq: Once | INTRAMUSCULAR | Status: AC
  Administered 2022-04-07: 1 mg via INTRAVENOUS
  Filled 2022-04-07: qty 1

## 2022-04-07 MED ORDER — PALONOSETRON HCL INJECTION 0.25 MG/5ML
0.2500 mg | Freq: Once | INTRAVENOUS | Status: AC
  Administered 2022-04-07: 0.25 mg via INTRAVENOUS
  Filled 2022-04-07: qty 5

## 2022-04-07 MED ORDER — FLUOROURACIL CHEMO INJECTION 2.5 GM/50ML
400.0000 mg/m2 | Freq: Once | INTRAVENOUS | Status: AC
  Administered 2022-04-07: 800 mg via INTRAVENOUS
  Filled 2022-04-07: qty 16

## 2022-04-07 MED ORDER — FAMOTIDINE IN NACL 20-0.9 MG/50ML-% IV SOLN
20.0000 mg | Freq: Once | INTRAVENOUS | Status: AC
  Administered 2022-04-07: 20 mg via INTRAVENOUS
  Filled 2022-04-07: qty 50

## 2022-04-07 MED ORDER — SODIUM CHLORIDE 0.9% FLUSH: 10.0000 mL | INTRAVENOUS | Status: DC | PRN

## 2022-04-07 MED ORDER — SODIUM CHLORIDE 0.9 % IV SOLN: Freq: Once | INTRAVENOUS | Status: AC

## 2022-04-07 MED ORDER — DIPHENHYDRAMINE HCL 50 MG/ML IJ SOLN
25.0000 mg | Freq: Once | INTRAMUSCULAR | Status: AC
  Administered 2022-04-07: 25 mg via INTRAVENOUS
  Filled 2022-04-07: qty 1

## 2022-04-07 MED ORDER — SODIUM CHLORIDE 0.9 % IV SOLN
5.0000 mg/kg | Freq: Once | INTRAVENOUS | Status: AC
  Administered 2022-04-07: 400 mg via INTRAVENOUS
  Filled 2022-04-07: qty 16

## 2022-04-07 NOTE — Progress Notes (Signed)
Urine Specimen collected

## 2022-04-07 NOTE — Progress Notes (Signed)
1430- patient reports nausea- Rosanne Sack and Ulice Dash notified and orders received,  1500- patient reports that nausea is resolving

## 2022-04-08 DIAGNOSIS — C182 Malignant neoplasm of ascending colon: Secondary | ICD-10-CM | POA: Diagnosis not present

## 2022-04-09 ENCOUNTER — Inpatient Hospital Stay: Payer: 59

## 2022-04-09 VITALS — BP 106/74 | HR 95 | Temp 97.9°F | Resp 18 | Ht 68.0 in | Wt 164.0 lb

## 2022-04-09 DIAGNOSIS — Z5189 Encounter for other specified aftercare: Secondary | ICD-10-CM | POA: Diagnosis not present

## 2022-04-09 DIAGNOSIS — C7971 Secondary malignant neoplasm of right adrenal gland: Secondary | ICD-10-CM | POA: Diagnosis not present

## 2022-04-09 DIAGNOSIS — C7802 Secondary malignant neoplasm of left lung: Secondary | ICD-10-CM | POA: Diagnosis not present

## 2022-04-09 DIAGNOSIS — C189 Malignant neoplasm of colon, unspecified: Secondary | ICD-10-CM | POA: Diagnosis not present

## 2022-04-09 DIAGNOSIS — Z5111 Encounter for antineoplastic chemotherapy: Secondary | ICD-10-CM | POA: Diagnosis not present

## 2022-04-09 DIAGNOSIS — C7801 Secondary malignant neoplasm of right lung: Secondary | ICD-10-CM | POA: Diagnosis not present

## 2022-04-09 DIAGNOSIS — C7931 Secondary malignant neoplasm of brain: Secondary | ICD-10-CM | POA: Diagnosis not present

## 2022-04-09 DIAGNOSIS — C786 Secondary malignant neoplasm of retroperitoneum and peritoneum: Secondary | ICD-10-CM | POA: Diagnosis not present

## 2022-04-09 DIAGNOSIS — C182 Malignant neoplasm of ascending colon: Secondary | ICD-10-CM

## 2022-04-09 MED ORDER — HEPARIN SOD (PORK) LOCK FLUSH 100 UNIT/ML IV SOLN
500.0000 [IU] | Freq: Once | INTRAVENOUS | Status: AC | PRN
  Administered 2022-04-09: 500 [IU]

## 2022-04-09 MED ORDER — SODIUM CHLORIDE 0.9% FLUSH
10.0000 mL | INTRAVENOUS | Status: DC | PRN
  Administered 2022-04-09: 10 mL

## 2022-04-09 NOTE — Patient Instructions (Signed)
The chemotherapy medication bag should finish at 46 hours, 96 hours, or 7 days. For example, if your pump is scheduled for 46 hours and it was put on at 4:00 p.m., it should finish at 2:00 p.m. the day it is scheduled to come off regardless of your appointment time.     Estimated time to finish at 1349.   If the display on your pump reads "Low Volume" and it is beeping, take the batteries out of the pump and come to the cancer center for it to be taken off.   If the pump alarms go off prior to the pump reading "Low Volume" then call [phone] and someone can assist you.  If the plunger comes out and the chemotherapy medication is leaking out, please use your home chemo spill kit to clean up the spill. Do NOT use paper towels or other household products.  If you have problems or questions regarding your pump, please call either [phone] (24 hours a day) or the cancer center Monday-Friday 8:00 a.m.- 4:30 p.m. at the clinic number and we will assist you. If you are unable to get assistance, then go to the nearest Emergency Department and ask the staff to contact the IV team for assistance.

## 2022-04-14 ENCOUNTER — Other Ambulatory Visit: Payer: Self-pay | Admitting: Psychiatry

## 2022-04-14 ENCOUNTER — Inpatient Hospital Stay: Payer: 59

## 2022-04-14 VITALS — BP 101/71 | HR 91 | Temp 98.1°F | Resp 16

## 2022-04-14 DIAGNOSIS — Z5189 Encounter for other specified aftercare: Secondary | ICD-10-CM | POA: Diagnosis not present

## 2022-04-14 DIAGNOSIS — C189 Malignant neoplasm of colon, unspecified: Secondary | ICD-10-CM | POA: Diagnosis not present

## 2022-04-14 DIAGNOSIS — F333 Major depressive disorder, recurrent, severe with psychotic symptoms: Secondary | ICD-10-CM

## 2022-04-14 DIAGNOSIS — C182 Malignant neoplasm of ascending colon: Secondary | ICD-10-CM

## 2022-04-14 DIAGNOSIS — C7971 Secondary malignant neoplasm of right adrenal gland: Secondary | ICD-10-CM | POA: Diagnosis not present

## 2022-04-14 DIAGNOSIS — C786 Secondary malignant neoplasm of retroperitoneum and peritoneum: Secondary | ICD-10-CM | POA: Diagnosis not present

## 2022-04-14 DIAGNOSIS — C7931 Secondary malignant neoplasm of brain: Secondary | ICD-10-CM | POA: Diagnosis not present

## 2022-04-14 DIAGNOSIS — C7801 Secondary malignant neoplasm of right lung: Secondary | ICD-10-CM | POA: Diagnosis not present

## 2022-04-14 DIAGNOSIS — C7802 Secondary malignant neoplasm of left lung: Secondary | ICD-10-CM | POA: Diagnosis not present

## 2022-04-14 DIAGNOSIS — F422 Mixed obsessional thoughts and acts: Secondary | ICD-10-CM

## 2022-04-14 DIAGNOSIS — C7800 Secondary malignant neoplasm of unspecified lung: Secondary | ICD-10-CM

## 2022-04-14 DIAGNOSIS — Z5111 Encounter for antineoplastic chemotherapy: Secondary | ICD-10-CM | POA: Diagnosis not present

## 2022-04-14 MED ORDER — FILGRASTIM-SNDZ 480 MCG/0.8ML IJ SOSY
480.0000 ug | PREFILLED_SYRINGE | Freq: Once | INTRAMUSCULAR | Status: AC
  Administered 2022-04-14: 480 ug via SUBCUTANEOUS
  Filled 2022-04-14: qty 0.8

## 2022-04-14 NOTE — Patient Instructions (Signed)
Filgrastim Injection What is this medication? FILGRASTIM (fil GRA stim) lowers the risk of infection in people who are receiving chemotherapy. It works by helping your body make more white blood cells, which protects your body from infection. It may also be used to help people who have been exposed to high doses of radiation. It can be used to help prepare your body before a stem cell transplant. It works by helping your bone marrow make and release stem cells into the blood. This medicine may be used for other purposes; ask your health care provider or pharmacist if you have questions. COMMON BRAND NAME(S): Neupogen, Nivestym, Releuko, Zarxio What should I tell my care team before I take this medication? They need to know if you have any of these conditions: History of blood diseases, such as sickle cell anemia Kidney disease Recent or ongoing radiation An unusual or allergic reaction to filgrastim, pegfilgrastim, latex, rubber, other medications, foods, dyes, or preservatives Pregnant or trying to get pregnant Breast-feeding How should I use this medication? This medication is injected under the skin or into a vein. It is usually given by your care team in a hospital or clinic setting. It may be given at home. If you get this medication at home, you will be taught how to prepare and give it. Use exactly as directed. Take it as directed on the prescription label at the same time every day. Keep taking it unless your care team tells you to stop. It is important that you put your used needles and syringes in a special sharps container. Do not put them in a trash can. If you do not have a sharps container, call your pharmacist or care team to get one. This medication comes with INSTRUCTIONS FOR USE. Ask your pharmacist for directions on how to use this medication. Read the information carefully. Talk to your pharmacist or care team if you have questions. Talk to your care team about the use of this  medication in children. While it may be prescribed for children for selected conditions, precautions do apply. Overdosage: If you think you have taken too much of this medicine contact a poison control center or emergency room at once. NOTE: This medicine is only for you. Do not share this medicine with others. What if I miss a dose? It is important not to miss any doses. Talk to your care team about what to do if you miss a dose. What may interact with this medication? Medications that may cause a release of neutrophils, such as lithium This list may not describe all possible interactions. Give your health care provider a list of all the medicines, herbs, non-prescription drugs, or dietary supplements you use. Also tell them if you smoke, drink alcohol, or use illegal drugs. Some items may interact with your medicine. What should I watch for while using this medication? Your condition will be monitored carefully while you are receiving this medication. You may need bloodwork while taking this medication. Talk to your care team about your risk of cancer. You may be more at risk for certain types of cancer if you take this medication. What side effects may I notice from receiving this medication? Side effects that you should report to your care team as soon as possible: Allergic reactions--skin rash, itching, hives, swelling of the face, lips, tongue, or throat Capillary leak syndrome--stomach or muscle pain, unusual weakness or fatigue, feeling faint or lightheaded, decrease in the amount of urine, swelling of the ankles, hands, or   feet, trouble breathing High white blood cell level--fever, fatigue, trouble breathing, night sweats, change in vision, weight loss Inflammation of the aorta--fever, fatigue, back, chest, or stomach pain, severe headache Kidney injury (glomerulonephritis)--decrease in the amount of urine, red or dark brown urine, foamy or bubbly urine, swelling of the ankles, hands, or  feet Shortness of breath or trouble breathing Spleen injury--pain in upper left stomach or shoulder Unusual bruising or bleeding Side effects that usually do not require medical attention (report to your care team if they continue or are bothersome): Back pain Bone pain Fatigue Fever Headache Nausea This list may not describe all possible side effects. Call your doctor for medical advice about side effects. You may report side effects to FDA at 1-800-FDA-1088. Where should I keep my medication? Keep out of the reach of children and pets. Keep this medication in the original packaging until you are ready to take it. Protect from light. See product for storage information. Each product may have different instructions. Get rid of any unused medication after the expiration date. To get rid of medications that are no longer needed or have expired: Take the medication to a medications take-back program. Check with your pharmacy or law enforcement to find a location. If you cannot return the medication, ask your pharmacist or care team how to get rid of this medication safely. NOTE: This sheet is a summary. It may not cover all possible information. If you have questions about this medicine, talk to your doctor, pharmacist, or health care provider.  2023 Elsevier/Gold Standard (2021-08-26 00:00:00)  

## 2022-04-15 ENCOUNTER — Inpatient Hospital Stay: Payer: 59

## 2022-04-15 VITALS — BP 128/90 | HR 103 | Resp 16

## 2022-04-15 DIAGNOSIS — C7801 Secondary malignant neoplasm of right lung: Secondary | ICD-10-CM | POA: Diagnosis not present

## 2022-04-15 DIAGNOSIS — Z5111 Encounter for antineoplastic chemotherapy: Secondary | ICD-10-CM | POA: Diagnosis not present

## 2022-04-15 DIAGNOSIS — C7800 Secondary malignant neoplasm of unspecified lung: Secondary | ICD-10-CM

## 2022-04-15 DIAGNOSIS — C786 Secondary malignant neoplasm of retroperitoneum and peritoneum: Secondary | ICD-10-CM | POA: Diagnosis not present

## 2022-04-15 DIAGNOSIS — Z5189 Encounter for other specified aftercare: Secondary | ICD-10-CM | POA: Diagnosis not present

## 2022-04-15 DIAGNOSIS — C189 Malignant neoplasm of colon, unspecified: Secondary | ICD-10-CM | POA: Diagnosis not present

## 2022-04-15 DIAGNOSIS — C7971 Secondary malignant neoplasm of right adrenal gland: Secondary | ICD-10-CM | POA: Diagnosis not present

## 2022-04-15 DIAGNOSIS — C787 Secondary malignant neoplasm of liver and intrahepatic bile duct: Secondary | ICD-10-CM

## 2022-04-15 DIAGNOSIS — C182 Malignant neoplasm of ascending colon: Secondary | ICD-10-CM

## 2022-04-15 DIAGNOSIS — C7802 Secondary malignant neoplasm of left lung: Secondary | ICD-10-CM | POA: Diagnosis not present

## 2022-04-15 DIAGNOSIS — C7931 Secondary malignant neoplasm of brain: Secondary | ICD-10-CM | POA: Diagnosis not present

## 2022-04-15 MED ORDER — FILGRASTIM-SNDZ 480 MCG/0.8ML IJ SOSY
480.0000 ug | PREFILLED_SYRINGE | Freq: Once | INTRAMUSCULAR | Status: AC
  Administered 2022-04-15: 480 ug via SUBCUTANEOUS
  Filled 2022-04-15: qty 0.8

## 2022-04-15 NOTE — Patient Instructions (Signed)
Filgrastim Injection What is this medication? FILGRASTIM (fil GRA stim) lowers the risk of infection in people who are receiving chemotherapy. It works by helping your body make more white blood cells, which protects your body from infection. It may also be used to help people who have been exposed to high doses of radiation. It can be used to help prepare your body before a stem cell transplant. It works by helping your bone marrow make and release stem cells into the blood. This medicine may be used for other purposes; ask your health care provider or pharmacist if you have questions. COMMON BRAND NAME(S): Neupogen, Nivestym, Releuko, Zarxio What should I tell my care team before I take this medication? They need to know if you have any of these conditions: History of blood diseases, such as sickle cell anemia Kidney disease Recent or ongoing radiation An unusual or allergic reaction to filgrastim, pegfilgrastim, latex, rubber, other medications, foods, dyes, or preservatives Pregnant or trying to get pregnant Breast-feeding How should I use this medication? This medication is injected under the skin or into a vein. It is usually given by your care team in a hospital or clinic setting. It may be given at home. If you get this medication at home, you will be taught how to prepare and give it. Use exactly as directed. Take it as directed on the prescription label at the same time every day. Keep taking it unless your care team tells you to stop. It is important that you put your used needles and syringes in a special sharps container. Do not put them in a trash can. If you do not have a sharps container, call your pharmacist or care team to get one. This medication comes with INSTRUCTIONS FOR USE. Ask your pharmacist for directions on how to use this medication. Read the information carefully. Talk to your pharmacist or care team if you have questions. Talk to your care team about the use of this  medication in children. While it may be prescribed for children for selected conditions, precautions do apply. Overdosage: If you think you have taken too much of this medicine contact a poison control center or emergency room at once. NOTE: This medicine is only for you. Do not share this medicine with others. What if I miss a dose? It is important not to miss any doses. Talk to your care team about what to do if you miss a dose. What may interact with this medication? Medications that may cause a release of neutrophils, such as lithium This list may not describe all possible interactions. Give your health care provider a list of all the medicines, herbs, non-prescription drugs, or dietary supplements you use. Also tell them if you smoke, drink alcohol, or use illegal drugs. Some items may interact with your medicine. What should I watch for while using this medication? Your condition will be monitored carefully while you are receiving this medication. You may need bloodwork while taking this medication. Talk to your care team about your risk of cancer. You may be more at risk for certain types of cancer if you take this medication. What side effects may I notice from receiving this medication? Side effects that you should report to your care team as soon as possible: Allergic reactions--skin rash, itching, hives, swelling of the face, lips, tongue, or throat Capillary leak syndrome--stomach or muscle pain, unusual weakness or fatigue, feeling faint or lightheaded, decrease in the amount of urine, swelling of the ankles, hands, or   feet, trouble breathing High white blood cell level--fever, fatigue, trouble breathing, night sweats, change in vision, weight loss Inflammation of the aorta--fever, fatigue, back, chest, or stomach pain, severe headache Kidney injury (glomerulonephritis)--decrease in the amount of urine, red or dark brown urine, foamy or bubbly urine, swelling of the ankles, hands, or  feet Shortness of breath or trouble breathing Spleen injury--pain in upper left stomach or shoulder Unusual bruising or bleeding Side effects that usually do not require medical attention (report to your care team if they continue or are bothersome): Back pain Bone pain Fatigue Fever Headache Nausea This list may not describe all possible side effects. Call your doctor for medical advice about side effects. You may report side effects to FDA at 1-800-FDA-1088. Where should I keep my medication? Keep out of the reach of children and pets. Keep this medication in the original packaging until you are ready to take it. Protect from light. See product for storage information. Each product may have different instructions. Get rid of any unused medication after the expiration date. To get rid of medications that are no longer needed or have expired: Take the medication to a medications take-back program. Check with your pharmacy or law enforcement to find a location. If you cannot return the medication, ask your pharmacist or care team how to get rid of this medication safely. NOTE: This sheet is a summary. It may not cover all possible information. If you have questions about this medicine, talk to your doctor, pharmacist, or health care provider.  2023 Elsevier/Gold Standard (2021-08-26 00:00:00)  

## 2022-04-16 ENCOUNTER — Inpatient Hospital Stay: Payer: 59

## 2022-04-16 VITALS — BP 118/83 | HR 92 | Temp 98.6°F | Resp 18 | Ht 68.0 in | Wt 164.1 lb

## 2022-04-16 DIAGNOSIS — C7931 Secondary malignant neoplasm of brain: Secondary | ICD-10-CM | POA: Diagnosis not present

## 2022-04-16 DIAGNOSIS — C7971 Secondary malignant neoplasm of right adrenal gland: Secondary | ICD-10-CM | POA: Diagnosis not present

## 2022-04-16 DIAGNOSIS — C7800 Secondary malignant neoplasm of unspecified lung: Secondary | ICD-10-CM

## 2022-04-16 DIAGNOSIS — C189 Malignant neoplasm of colon, unspecified: Secondary | ICD-10-CM

## 2022-04-16 DIAGNOSIS — C786 Secondary malignant neoplasm of retroperitoneum and peritoneum: Secondary | ICD-10-CM | POA: Diagnosis not present

## 2022-04-16 DIAGNOSIS — C7802 Secondary malignant neoplasm of left lung: Secondary | ICD-10-CM | POA: Diagnosis not present

## 2022-04-16 DIAGNOSIS — Z5189 Encounter for other specified aftercare: Secondary | ICD-10-CM | POA: Diagnosis not present

## 2022-04-16 DIAGNOSIS — C7801 Secondary malignant neoplasm of right lung: Secondary | ICD-10-CM | POA: Diagnosis not present

## 2022-04-16 DIAGNOSIS — Z5111 Encounter for antineoplastic chemotherapy: Secondary | ICD-10-CM | POA: Diagnosis not present

## 2022-04-16 DIAGNOSIS — C182 Malignant neoplasm of ascending colon: Secondary | ICD-10-CM

## 2022-04-16 MED ORDER — FILGRASTIM-SNDZ 480 MCG/0.8ML IJ SOSY
480.0000 ug | PREFILLED_SYRINGE | Freq: Once | INTRAMUSCULAR | Status: AC
  Administered 2022-04-16: 480 ug via SUBCUTANEOUS
  Filled 2022-04-16: qty 0.8

## 2022-04-16 NOTE — Patient Instructions (Signed)
Filgrastim Injection What is this medication? FILGRASTIM (fil GRA stim) lowers the risk of infection in people who are receiving chemotherapy. It works by helping your body make more white blood cells, which protects your body from infection. It may also be used to help people who have been exposed to high doses of radiation. It can be used to help prepare your body before a stem cell transplant. It works by helping your bone marrow make and release stem cells into the blood. This medicine may be used for other purposes; ask your health care provider or pharmacist if you have questions. COMMON BRAND NAME(S): Neupogen, Nivestym, Releuko, Zarxio What should I tell my care team before I take this medication? They need to know if you have any of these conditions: History of blood diseases, such as sickle cell anemia Kidney disease Recent or ongoing radiation An unusual or allergic reaction to filgrastim, pegfilgrastim, latex, rubber, other medications, foods, dyes, or preservatives Pregnant or trying to get pregnant Breast-feeding How should I use this medication? This medication is injected under the skin or into a vein. It is usually given by your care team in a hospital or clinic setting. It may be given at home. If you get this medication at home, you will be taught how to prepare and give it. Use exactly as directed. Take it as directed on the prescription label at the same time every day. Keep taking it unless your care team tells you to stop. It is important that you put your used needles and syringes in a special sharps container. Do not put them in a trash can. If you do not have a sharps container, call your pharmacist or care team to get one. This medication comes with INSTRUCTIONS FOR USE. Ask your pharmacist for directions on how to use this medication. Read the information carefully. Talk to your pharmacist or care team if you have questions. Talk to your care team about the use of this  medication in children. While it may be prescribed for children for selected conditions, precautions do apply. Overdosage: If you think you have taken too much of this medicine contact a poison control center or emergency room at once. NOTE: This medicine is only for you. Do not share this medicine with others. What if I miss a dose? It is important not to miss any doses. Talk to your care team about what to do if you miss a dose. What may interact with this medication? Medications that may cause a release of neutrophils, such as lithium This list may not describe all possible interactions. Give your health care provider a list of all the medicines, herbs, non-prescription drugs, or dietary supplements you use. Also tell them if you smoke, drink alcohol, or use illegal drugs. Some items may interact with your medicine. What should I watch for while using this medication? Your condition will be monitored carefully while you are receiving this medication. You may need bloodwork while taking this medication. Talk to your care team about your risk of cancer. You may be more at risk for certain types of cancer if you take this medication. What side effects may I notice from receiving this medication? Side effects that you should report to your care team as soon as possible: Allergic reactions--skin rash, itching, hives, swelling of the face, lips, tongue, or throat Capillary leak syndrome--stomach or muscle pain, unusual weakness or fatigue, feeling faint or lightheaded, decrease in the amount of urine, swelling of the ankles, hands, or   feet, trouble breathing High white blood cell level--fever, fatigue, trouble breathing, night sweats, change in vision, weight loss Inflammation of the aorta--fever, fatigue, back, chest, or stomach pain, severe headache Kidney injury (glomerulonephritis)--decrease in the amount of urine, red or dark brown urine, foamy or bubbly urine, swelling of the ankles, hands, or  feet Shortness of breath or trouble breathing Spleen injury--pain in upper left stomach or shoulder Unusual bruising or bleeding Side effects that usually do not require medical attention (report to your care team if they continue or are bothersome): Back pain Bone pain Fatigue Fever Headache Nausea This list may not describe all possible side effects. Call your doctor for medical advice about side effects. You may report side effects to FDA at 1-800-FDA-1088. Where should I keep my medication? Keep out of the reach of children and pets. Keep this medication in the original packaging until you are ready to take it. Protect from light. See product for storage information. Each product may have different instructions. Get rid of any unused medication after the expiration date. To get rid of medications that are no longer needed or have expired: Take the medication to a medications take-back program. Check with your pharmacy or law enforcement to find a location. If you cannot return the medication, ask your pharmacist or care team how to get rid of this medication safely. NOTE: This sheet is a summary. It may not cover all possible information. If you have questions about this medicine, talk to your doctor, pharmacist, or health care provider.  2023 Elsevier/Gold Standard (2021-08-26 00:00:00)  

## 2022-04-17 ENCOUNTER — Inpatient Hospital Stay: Payer: 59

## 2022-04-17 VITALS — BP 136/81 | HR 63 | Temp 98.0°F | Resp 18 | Wt 163.0 lb

## 2022-04-17 DIAGNOSIS — C7801 Secondary malignant neoplasm of right lung: Secondary | ICD-10-CM | POA: Diagnosis not present

## 2022-04-17 DIAGNOSIS — Z5111 Encounter for antineoplastic chemotherapy: Secondary | ICD-10-CM | POA: Diagnosis not present

## 2022-04-17 DIAGNOSIS — C189 Malignant neoplasm of colon, unspecified: Secondary | ICD-10-CM | POA: Diagnosis not present

## 2022-04-17 DIAGNOSIS — Z5189 Encounter for other specified aftercare: Secondary | ICD-10-CM | POA: Diagnosis not present

## 2022-04-17 DIAGNOSIS — C7802 Secondary malignant neoplasm of left lung: Secondary | ICD-10-CM | POA: Diagnosis not present

## 2022-04-17 DIAGNOSIS — C182 Malignant neoplasm of ascending colon: Secondary | ICD-10-CM

## 2022-04-17 DIAGNOSIS — C7800 Secondary malignant neoplasm of unspecified lung: Secondary | ICD-10-CM

## 2022-04-17 DIAGNOSIS — C7971 Secondary malignant neoplasm of right adrenal gland: Secondary | ICD-10-CM | POA: Diagnosis not present

## 2022-04-17 DIAGNOSIS — C786 Secondary malignant neoplasm of retroperitoneum and peritoneum: Secondary | ICD-10-CM | POA: Diagnosis not present

## 2022-04-17 DIAGNOSIS — C7931 Secondary malignant neoplasm of brain: Secondary | ICD-10-CM | POA: Diagnosis not present

## 2022-04-17 MED ORDER — FILGRASTIM-SNDZ 480 MCG/0.8ML IJ SOSY
480.0000 ug | PREFILLED_SYRINGE | Freq: Once | INTRAMUSCULAR | Status: AC
  Administered 2022-04-17: 480 ug via SUBCUTANEOUS
  Filled 2022-04-17: qty 0.8

## 2022-04-17 NOTE — Patient Instructions (Signed)
Filgrastim Injection What is this medication? FILGRASTIM (fil GRA stim) lowers the risk of infection in people who are receiving chemotherapy. It works by helping your body make more white blood cells, which protects your body from infection. It may also be used to help people who have been exposed to high doses of radiation. It can be used to help prepare your body before a stem cell transplant. It works by helping your bone marrow make and release stem cells into the blood. This medicine may be used for other purposes; ask your health care provider or pharmacist if you have questions. COMMON BRAND NAME(S): Neupogen, Nivestym, Releuko, Zarxio What should I tell my care team before I take this medication? They need to know if you have any of these conditions: History of blood diseases, such as sickle cell anemia Kidney disease Recent or ongoing radiation An unusual or allergic reaction to filgrastim, pegfilgrastim, latex, rubber, other medications, foods, dyes, or preservatives Pregnant or trying to get pregnant Breast-feeding How should I use this medication? This medication is injected under the skin or into a vein. It is usually given by your care team in a hospital or clinic setting. It may be given at home. If you get this medication at home, you will be taught how to prepare and give it. Use exactly as directed. Take it as directed on the prescription label at the same time every day. Keep taking it unless your care team tells you to stop. It is important that you put your used needles and syringes in a special sharps container. Do not put them in a trash can. If you do not have a sharps container, call your pharmacist or care team to get one. This medication comes with INSTRUCTIONS FOR USE. Ask your pharmacist for directions on how to use this medication. Read the information carefully. Talk to your pharmacist or care team if you have questions. Talk to your care team about the use of this  medication in children. While it may be prescribed for children for selected conditions, precautions do apply. Overdosage: If you think you have taken too much of this medicine contact a poison control center or emergency room at once. NOTE: This medicine is only for you. Do not share this medicine with others. What if I miss a dose? It is important not to miss any doses. Talk to your care team about what to do if you miss a dose. What may interact with this medication? Medications that may cause a release of neutrophils, such as lithium This list may not describe all possible interactions. Give your health care provider a list of all the medicines, herbs, non-prescription drugs, or dietary supplements you use. Also tell them if you smoke, drink alcohol, or use illegal drugs. Some items may interact with your medicine. What should I watch for while using this medication? Your condition will be monitored carefully while you are receiving this medication. You may need bloodwork while taking this medication. Talk to your care team about your risk of cancer. You may be more at risk for certain types of cancer if you take this medication. What side effects may I notice from receiving this medication? Side effects that you should report to your care team as soon as possible: Allergic reactions--skin rash, itching, hives, swelling of the face, lips, tongue, or throat Capillary leak syndrome--stomach or muscle pain, unusual weakness or fatigue, feeling faint or lightheaded, decrease in the amount of urine, swelling of the ankles, hands, or   feet, trouble breathing High white blood cell level--fever, fatigue, trouble breathing, night sweats, change in vision, weight loss Inflammation of the aorta--fever, fatigue, back, chest, or stomach pain, severe headache Kidney injury (glomerulonephritis)--decrease in the amount of urine, red or dark brown urine, foamy or bubbly urine, swelling of the ankles, hands, or  feet Shortness of breath or trouble breathing Spleen injury--pain in upper left stomach or shoulder Unusual bruising or bleeding Side effects that usually do not require medical attention (report to your care team if they continue or are bothersome): Back pain Bone pain Fatigue Fever Headache Nausea This list may not describe all possible side effects. Call your doctor for medical advice about side effects. You may report side effects to FDA at 1-800-FDA-1088. Where should I keep my medication? Keep out of the reach of children and pets. Keep this medication in the original packaging until you are ready to take it. Protect from light. See product for storage information. Each product may have different instructions. Get rid of any unused medication after the expiration date. To get rid of medications that are no longer needed or have expired: Take the medication to a medications take-back program. Check with your pharmacy or law enforcement to find a location. If you cannot return the medication, ask your pharmacist or care team how to get rid of this medication safely. NOTE: This sheet is a summary. It may not cover all possible information. If you have questions about this medicine, talk to your doctor, pharmacist, or health care provider.  2023 Elsevier/Gold Standard (2021-08-26 00:00:00)  

## 2022-04-24 ENCOUNTER — Other Ambulatory Visit: Payer: Self-pay | Admitting: Psychiatry

## 2022-04-24 DIAGNOSIS — F333 Major depressive disorder, recurrent, severe with psychotic symptoms: Secondary | ICD-10-CM

## 2022-04-26 NOTE — Progress Notes (Signed)
[facility]  [address] [place],  [place]  [address] [phone]  Clinic Day:  04/02/2022  Referring physician: Greig Right, MD  HISTORY OF PRESENT ILLNESS:  The patient is a 56 y.o. female with  metastatic colon cancer, which includes spread of disease to her abdominal cavity, lungs, brain and right adrenal gland. She comes in today to be evaluated before heading into her 38th cycle of palliative FOLFIRI/Avastin chemotherapy.  Of note, leucovorin has been discontinued due to an allergic reaction from this agent that did not improve despite being predmedicated with diphenhydramine and famotidine. The patient claims to have tolerated her 37th cycle of treatment okay.  She continues to report intermittent diarrhea, but is using Imodium as needed.  She also has had intermittent nausea, but it is managed with oral antiemetics.  She denies having any new symptoms which concern her for possible disease progression.   With respect to her colon cancer history, she is status post a right hemicolectomy in early October 2018, followed by 12 cycles of adjuvant FOLFOX chemotherapy, which were completed in April 2019.  In December 2021, she underwent a left cerebellar metastasectomy, whose pathology was consistent with metastatic colon cancer.  Tumor testing did come back MMR normal.  CT scans revealed evidence of her cancer being in multiple locations, for which she has been taking FOLFIRI/Avastin ever since.  PHYSICAL EXAM:  Blood pressure 131/85, pulse (!) 104, temperature 98.3 F (36.8 C), resp. rate 14, height _0  (1.727 m), weight 162 lb 6.4 oz (73.7 kg), last menstrual period 02/22/2019, SpO2 94 %.  Body mass index is 24.69 kg/m.  Performance status (ECOG): 1 - Symptomatic but completely ambulatory  Physical Exam Vitals and nursing note reviewed.  Constitutional:      General: She is not in acute distress.    Appearance: Normal appearance.  HENT:      Head: Normocephalic and atraumatic.     Mouth/Throat:     Mouth: Mucous membranes are moist.     Pharynx: Oropharynx is clear. No oropharyngeal exudate or posterior oropharyngeal erythema.  Eyes:     General: No scleral icterus.    Extraocular Movements: Extraocular movements intact.     Conjunctiva/sclera: Conjunctivae normal.     Pupils: Pupils are equal, round, and reactive to light.  Cardiovascular:     Rate and Rhythm: Normal rate and regular rhythm.     Heart sounds: Normal heart sounds. No murmur heard.    No friction rub. No gallop.  Pulmonary:     Effort: Pulmonary effort is normal.     Breath sounds: Normal breath sounds. No wheezing, rhonchi or rales.  Abdominal:     General: There is no distension.     Palpations: Abdomen is soft. There is no hepatomegaly, splenomegaly or mass.     Tenderness: There is no abdominal tenderness.  Musculoskeletal:        General: Normal range of motion.     Cervical back: Normal range of motion and neck supple. No tenderness.     Right lower leg: No edema.     Left lower leg: No edema.  Lymphadenopathy:     Cervical: No cervical adenopathy.     Upper Body:     Right upper body: No supraclavicular or axillary adenopathy.     Left upper body: No supraclavicular or axillary adenopathy.     Lower Body: No right inguinal adenopathy. No left inguinal adenopathy.  Skin:    General:  Skin is warm and dry.     Coloration: Skin is not jaundiced.     Findings: No rash.  Neurological:     Mental Status: She is alert and oriented to person, place, and time.     Cranial Nerves: No cranial nerve deficit.  Psychiatric:        Mood and Affect: Mood normal.        Behavior: Behavior normal.        Thought Content: Thought content normal.    LABS:      Latest Ref Rng & Units 05/01/2022   11:15 AM 04/27/2022   12:00 AM 04/02/2022   12:00 AM  CBC  WBC 4.0 - 10.5 K/uL 7.6  5.0     2.3      Hemoglobin 12.0 - 15.0 g/dL 13.1  13.2     12.8       Hematocrit 36.0 - 46.0 % 42.3  40     39      Platelets 150 - 400 K/uL 130  100     92         This result is from an external source.      Latest Ref Rng & Units 05/01/2022   11:15 AM 04/27/2022   12:00 AM 04/02/2022   12:00 AM  CMP  Glucose 70 - 99 mg/dL 112     BUN 6 - 20 mg/dL _0 Creatinine 0.44 - 1.00 mg/dL 1.43  1.2     1.3      Sodium 135 - 145 mmol/L 141  139     141      Potassium 3.5 - 5.1 mmol/L 4.0  4.2     3.7      Chloride 98 - 111 mmol/L 103  105     105      CO2 22 - 32 mmol/L _1 Calcium 8.9 - 10.3 mg/dL 9.1  9.3     9.3      Total Protein 6.5 - 8.1 g/dL 7.0     Total Bilirubin 0.3 - 1.2 mg/dL 0.5     Alkaline Phos 38 - 126 U/L 104  121     134      AST 15 - 41 U/L 21  25     33      ALT 0 - 44 U/L _2 This result is from an external source.   ASSESSMENT & PLAN:  Assessment/Plan:  A 56 y.o. female with metastatic colon cancer.  She will proceed with her 38th cycle of FOLFIRI/Avastin (without leucovorin) next week, which she will continue to receive every 3 weeks.  Clinically, she is doing well.  I will see her back in 3 weeks before she heads into her 39th cycle of FOLFIRI/Avastin. The patient understands all the plans discussed today and is in agreement with them.    Ciarah Peace Macarthur Critchley, MD

## 2022-04-27 ENCOUNTER — Inpatient Hospital Stay: Payer: 59

## 2022-04-27 ENCOUNTER — Inpatient Hospital Stay (HOSPITAL_BASED_OUTPATIENT_CLINIC_OR_DEPARTMENT_OTHER): Payer: 59 | Admitting: Oncology

## 2022-04-27 DIAGNOSIS — C182 Malignant neoplasm of ascending colon: Secondary | ICD-10-CM | POA: Diagnosis not present

## 2022-04-27 DIAGNOSIS — C7800 Secondary malignant neoplasm of unspecified lung: Secondary | ICD-10-CM

## 2022-04-27 DIAGNOSIS — C787 Secondary malignant neoplasm of liver and intrahepatic bile duct: Secondary | ICD-10-CM | POA: Diagnosis not present

## 2022-04-27 DIAGNOSIS — C189 Malignant neoplasm of colon, unspecified: Secondary | ICD-10-CM | POA: Diagnosis not present

## 2022-04-27 LAB — HEPATIC FUNCTION PANEL
ALT: 19 U/L (ref 7–35)
AST: 25 (ref 13–35)
Alkaline Phosphatase: 121 (ref 25–125)
Bilirubin, Total: 0.5

## 2022-04-27 LAB — CBC AND DIFFERENTIAL
HCT: 40 (ref 36–46)
Hemoglobin: 13.2 (ref 12.0–16.0)
Neutrophils Absolute: 3.7
Platelets: 100 10*3/uL — AB (ref 150–400)
WBC: 5

## 2022-04-27 LAB — BASIC METABOLIC PANEL
BUN: 10 (ref 4–21)
CO2: 26 — AB (ref 13–22)
Chloride: 105 (ref 99–108)
Creatinine: 1.2 — AB (ref 0.5–1.1)
Glucose: 98
Potassium: 4.2 mEq/L (ref 3.5–5.1)
Sodium: 139 (ref 137–147)

## 2022-04-27 LAB — COMPREHENSIVE METABOLIC PANEL
Albumin: 4.1 (ref 3.5–5.0)
Calcium: 9.3 (ref 8.7–10.7)

## 2022-04-27 LAB — CBC: RBC: 4.13 (ref 3.87–5.11)

## 2022-04-27 MED FILL — Irinotecan HCl Inj 100 MG/5ML (20 MG/ML): INTRAVENOUS | Qty: 14 | Status: AC

## 2022-04-27 MED FILL — Fluorouracil IV Soln 2.5 GM/50ML (50 MG/ML): INTRAVENOUS | Qty: 16 | Status: AC

## 2022-04-27 MED FILL — Dexamethasone Sodium Phosphate Inj 100 MG/10ML: INTRAMUSCULAR | Qty: 1 | Status: AC

## 2022-04-27 MED FILL — Bevacizumab-awwb IV Soln 400 MG/16ML (For Infusion): INTRAVENOUS | Qty: 16 | Status: AC

## 2022-04-27 MED FILL — Fluorouracil IV Soln 5 GM/100ML (50 MG/ML): INTRAVENOUS | Qty: 100 | Status: AC

## 2022-04-28 ENCOUNTER — Inpatient Hospital Stay: Payer: 59

## 2022-04-28 ENCOUNTER — Encounter: Payer: Self-pay | Admitting: Oncology

## 2022-04-28 ENCOUNTER — Telehealth: Payer: Self-pay

## 2022-04-28 NOTE — Telephone Encounter (Signed)
PATIENT CALLED IN SICK WITH RUNNY NOSE , COUGH , AND HEADACHE NO FEVER. TOOK A COVID TEST SHE WAS NEGATIVE. R/S APPTS FOR 12/1 FOR LABS AND 12/5 FOR TREATMENT.

## 2022-04-29 DIAGNOSIS — C182 Malignant neoplasm of ascending colon: Secondary | ICD-10-CM | POA: Diagnosis not present

## 2022-05-01 ENCOUNTER — Inpatient Hospital Stay: Payer: 59 | Attending: Oncology

## 2022-05-01 DIAGNOSIS — C7971 Secondary malignant neoplasm of right adrenal gland: Secondary | ICD-10-CM | POA: Insufficient documentation

## 2022-05-01 DIAGNOSIS — C7802 Secondary malignant neoplasm of left lung: Secondary | ICD-10-CM | POA: Insufficient documentation

## 2022-05-01 DIAGNOSIS — C786 Secondary malignant neoplasm of retroperitoneum and peritoneum: Secondary | ICD-10-CM | POA: Diagnosis not present

## 2022-05-01 DIAGNOSIS — C7801 Secondary malignant neoplasm of right lung: Secondary | ICD-10-CM | POA: Diagnosis not present

## 2022-05-01 DIAGNOSIS — C189 Malignant neoplasm of colon, unspecified: Secondary | ICD-10-CM | POA: Diagnosis not present

## 2022-05-01 DIAGNOSIS — C182 Malignant neoplasm of ascending colon: Secondary | ICD-10-CM

## 2022-05-01 DIAGNOSIS — Z5111 Encounter for antineoplastic chemotherapy: Secondary | ICD-10-CM | POA: Insufficient documentation

## 2022-05-01 DIAGNOSIS — C7931 Secondary malignant neoplasm of brain: Secondary | ICD-10-CM | POA: Diagnosis not present

## 2022-05-01 DIAGNOSIS — Z5189 Encounter for other specified aftercare: Secondary | ICD-10-CM | POA: Insufficient documentation

## 2022-05-01 DIAGNOSIS — C787 Secondary malignant neoplasm of liver and intrahepatic bile duct: Secondary | ICD-10-CM

## 2022-05-01 LAB — CMP (CANCER CENTER ONLY)
ALT: 15 U/L (ref 0–44)
AST: 21 U/L (ref 15–41)
Albumin: 3.7 g/dL (ref 3.5–5.0)
Alkaline Phosphatase: 104 U/L (ref 38–126)
Anion gap: 10 (ref 5–15)
BUN: 16 mg/dL (ref 6–20)
CO2: 28 mmol/L (ref 22–32)
Calcium: 9.1 mg/dL (ref 8.9–10.3)
Chloride: 103 mmol/L (ref 98–111)
Creatinine: 1.43 mg/dL — ABNORMAL HIGH (ref 0.44–1.00)
GFR, Estimated: 44 mL/min — ABNORMAL LOW (ref >60)
Glucose, Bld: 112 mg/dL — ABNORMAL HIGH (ref 70–99)
Potassium: 4 mmol/L (ref 3.5–5.1)
Sodium: 141 mmol/L (ref 135–145)
Total Bilirubin: 0.5 mg/dL (ref 0.3–1.2)
Total Protein: 7 g/dL (ref 6.5–8.1)

## 2022-05-01 LAB — CBC WITH DIFFERENTIAL (CANCER CENTER ONLY)
Abs Immature Granulocytes: 0.02 10*3/uL (ref 0.00–0.07)
Basophils Absolute: 0 10*3/uL (ref 0.0–0.1)
Basophils Relative: 0 %
Eosinophils Absolute: 0.1 10*3/uL (ref 0.0–0.5)
Eosinophils Relative: 1 %
HCT: 42.3 % (ref 36.0–46.0)
Hemoglobin: 13.1 g/dL (ref 12.0–15.0)
Immature Granulocytes: 0 %
Lymphocytes Relative: 15 %
Lymphs Abs: 1.1 10*3/uL (ref 0.7–4.0)
MCH: 31.4 pg (ref 26.0–34.0)
MCHC: 31 g/dL (ref 30.0–36.0)
MCV: 101.4 fL — ABNORMAL HIGH (ref 80.0–100.0)
Monocytes Absolute: 0.5 10*3/uL (ref 0.1–1.0)
Monocytes Relative: 6 %
Neutro Abs: 5.9 10*3/uL (ref 1.7–7.7)
Neutrophils Relative %: 78 %
Platelet Count: 130 10*3/uL — ABNORMAL LOW (ref 150–400)
RBC: 4.17 MIL/uL (ref 3.87–5.11)
RDW: 13.9 % (ref 11.5–15.5)
WBC Count: 7.6 10*3/uL (ref 4.0–10.5)
nRBC: 0 % (ref 0.0–0.2)

## 2022-05-03 ENCOUNTER — Other Ambulatory Visit: Payer: Self-pay | Admitting: Psychiatry

## 2022-05-03 DIAGNOSIS — F422 Mixed obsessional thoughts and acts: Secondary | ICD-10-CM

## 2022-05-04 MED FILL — Bevacizumab-awwb IV Soln 400 MG/16ML (For Infusion): INTRAVENOUS | Qty: 16 | Status: AC

## 2022-05-04 MED FILL — Dexamethasone Sodium Phosphate Inj 100 MG/10ML: INTRAMUSCULAR | Qty: 1 | Status: AC

## 2022-05-04 MED FILL — Fluorouracil IV Soln 2.5 GM/50ML (50 MG/ML): INTRAVENOUS | Qty: 16 | Status: AC

## 2022-05-04 MED FILL — Fluorouracil IV Soln 5 GM/100ML (50 MG/ML): INTRAVENOUS | Qty: 100 | Status: AC

## 2022-05-04 MED FILL — Fosaprepitant Dimeglumine For IV Infusion 150 MG (Base Eq): INTRAVENOUS | Qty: 5 | Status: AC

## 2022-05-04 MED FILL — Irinotecan HCl Inj 100 MG/5ML (20 MG/ML): INTRAVENOUS | Qty: 14 | Status: AC

## 2022-05-05 ENCOUNTER — Inpatient Hospital Stay: Payer: 59

## 2022-05-05 ENCOUNTER — Ambulatory Visit: Payer: 59

## 2022-05-05 VITALS — BP 119/85 | HR 76 | Temp 97.6°F | Resp 18

## 2022-05-05 DIAGNOSIS — C787 Secondary malignant neoplasm of liver and intrahepatic bile duct: Secondary | ICD-10-CM

## 2022-05-05 DIAGNOSIS — C7802 Secondary malignant neoplasm of left lung: Secondary | ICD-10-CM | POA: Diagnosis not present

## 2022-05-05 DIAGNOSIS — Z5111 Encounter for antineoplastic chemotherapy: Secondary | ICD-10-CM | POA: Diagnosis not present

## 2022-05-05 DIAGNOSIS — C7931 Secondary malignant neoplasm of brain: Secondary | ICD-10-CM | POA: Diagnosis not present

## 2022-05-05 DIAGNOSIS — C182 Malignant neoplasm of ascending colon: Secondary | ICD-10-CM

## 2022-05-05 DIAGNOSIS — Z5189 Encounter for other specified aftercare: Secondary | ICD-10-CM | POA: Diagnosis not present

## 2022-05-05 DIAGNOSIS — C786 Secondary malignant neoplasm of retroperitoneum and peritoneum: Secondary | ICD-10-CM | POA: Diagnosis not present

## 2022-05-05 DIAGNOSIS — C189 Malignant neoplasm of colon, unspecified: Secondary | ICD-10-CM

## 2022-05-05 DIAGNOSIS — C7971 Secondary malignant neoplasm of right adrenal gland: Secondary | ICD-10-CM | POA: Diagnosis not present

## 2022-05-05 DIAGNOSIS — C7801 Secondary malignant neoplasm of right lung: Secondary | ICD-10-CM | POA: Diagnosis not present

## 2022-05-05 MED ORDER — AZITHROMYCIN 250 MG PO TABS: ORAL_TABLET | ORAL | 0 refills | Status: DC

## 2022-05-05 MED ORDER — HEPARIN SOD (PORK) LOCK FLUSH 100 UNIT/ML IV SOLN: 500.0000 [IU] | Freq: Once | INTRAVENOUS | Status: DC | PRN

## 2022-05-05 MED ORDER — SODIUM CHLORIDE 0.9% FLUSH: 10.0000 mL | INTRAVENOUS | Status: DC | PRN

## 2022-05-05 MED ORDER — SODIUM CHLORIDE 0.9 % IV SOLN: Freq: Once | INTRAVENOUS | Status: AC

## 2022-05-05 MED ORDER — ATROPINE SULFATE 1 MG/ML IV SOLN
0.5000 mg | Freq: Once | INTRAVENOUS | Status: AC | PRN
  Administered 2022-05-05: 0.5 mg via INTRAVENOUS
  Filled 2022-05-05: qty 1

## 2022-05-05 MED ORDER — FLUOROURACIL CHEMO INJECTION 2.5 GM/50ML
400.0000 mg/m2 | Freq: Once | INTRAVENOUS | Status: AC
  Administered 2022-05-05: 800 mg via INTRAVENOUS
  Filled 2022-05-05 (×2): qty 16

## 2022-05-05 MED ORDER — FAMOTIDINE IN NACL 20-0.9 MG/50ML-% IV SOLN
20.0000 mg | Freq: Once | INTRAVENOUS | Status: AC
  Administered 2022-05-05: 20 mg via INTRAVENOUS
  Filled 2022-05-05: qty 50

## 2022-05-05 MED ORDER — SODIUM CHLORIDE 0.9 % IV SOLN
10.0000 mg | Freq: Once | INTRAVENOUS | Status: AC
  Administered 2022-05-05: 10 mg via INTRAVENOUS
  Filled 2022-05-05: qty 10
  Filled 2022-05-05: qty 1

## 2022-05-05 MED ORDER — SODIUM CHLORIDE 0.9 % IV SOLN
150.0000 mg | Freq: Once | INTRAVENOUS | Status: AC
  Administered 2022-05-05: 150 mg via INTRAVENOUS
  Filled 2022-05-05: qty 150
  Filled 2022-05-05: qty 5

## 2022-05-05 MED ORDER — SODIUM CHLORIDE 0.9 % IV SOLN
5.0000 mg/kg | Freq: Once | INTRAVENOUS | Status: AC
  Administered 2022-05-05: 400 mg via INTRAVENOUS
  Filled 2022-05-05 (×2): qty 16

## 2022-05-05 MED ORDER — SODIUM CHLORIDE 0.9 % IV SOLN: Freq: Once | INTRAVENOUS | Status: DC

## 2022-05-05 MED ORDER — DIPHENHYDRAMINE HCL 50 MG/ML IJ SOLN
25.0000 mg | Freq: Once | INTRAMUSCULAR | Status: AC
  Administered 2022-05-05: 25 mg via INTRAVENOUS
  Filled 2022-05-05: qty 1

## 2022-05-05 MED ORDER — SODIUM CHLORIDE 0.9 % IV SOLN
2575.0000 mg/m2 | INTRAVENOUS | Status: DC
  Administered 2022-05-05: 5000 mg via INTRAVENOUS
  Filled 2022-05-05 (×2): qty 100

## 2022-05-05 MED ORDER — PALONOSETRON HCL INJECTION 0.25 MG/5ML
0.2500 mg | Freq: Once | INTRAVENOUS | Status: AC
  Administered 2022-05-05: 0.25 mg via INTRAVENOUS
  Filled 2022-05-05: qty 5

## 2022-05-05 MED ORDER — IRINOTECAN HCL CHEMO INJECTION 100 MG/5ML
144.0000 mg/m2 | Freq: Once | INTRAVENOUS | Status: AC
  Administered 2022-05-05: 280 mg via INTRAVENOUS
  Filled 2022-05-05: qty 10
  Filled 2022-05-05: qty 14

## 2022-05-05 NOTE — Patient Instructions (Signed)
Fluorouracil Injection What is this medication? FLUOROURACIL (flure oh YOOR a sil) treats some types of cancer. It works by slowing down the growth of cancer cells. This medicine may be used for other purposes; ask your health care provider or pharmacist if you have questions. COMMON BRAND NAME(S): Adrucil What should I tell my care team before I take this medication? They need to know if you have any of these conditions: Blood disorders Dihydropyrimidine dehydrogenase (DPD) deficiency Infection, such as chickenpox, cold sores, herpes Kidney disease Liver disease Poor nutrition Recent or ongoing radiation therapy An unusual or allergic reaction to fluorouracil, other medications, foods, dyes, or preservatives If you or your partner are pregnant or trying to get pregnant Breast-feeding How should I use this medication? This medication is injected into a vein. It is administered by your care team in a hospital or clinic setting. Talk to your care team about the use of this medication in children. Special care may be needed. Overdosage: If you think you have taken too much of this medicine contact a poison control center or emergency room at once. NOTE: This medicine is only for you. Do not share this medicine with others. What if I miss a dose? Keep appointments for follow-up doses. It is important not to miss your dose. Call your care team if you are unable to keep an appointment. What may interact with this medication? Do not take this medication with any of the following: Live virus vaccines This medication may also interact with the following: Medications that treat or prevent blood clots, such as warfarin, enoxaparin, dalteparin This list may not describe all possible interactions. Give your health care provider a list of all the medicines, herbs, non-prescription drugs, or dietary supplements you use. Also tell them if you smoke, drink alcohol, or use illegal drugs. Some items may  interact with your medicine. What should I watch for while using this medication? Your condition will be monitored carefully while you are receiving this medication. This medication may make you feel generally unwell. This is not uncommon as chemotherapy can affect healthy cells as well as cancer cells. Report any side effects. Continue your course of treatment even though you feel ill unless your care team tells you to stop. In some cases, you may be given additional medications to help with side effects. Follow all directions for their use. This medication may increase your risk of getting an infection. Call your care team for advice if you get a fever, chills, sore throat, or other symptoms of a cold or flu. Do not treat yourself. Try to avoid being around people who are sick. This medication may increase your risk to bruise or bleed. Call your care team if you notice any unusual bleeding. Be careful brushing or flossing your teeth or using a toothpick because you may get an infection or bleed more easily. If you have any dental work done, tell your dentist you are receiving this medication. Avoid taking medications that contain aspirin, acetaminophen, ibuprofen, naproxen, or ketoprofen unless instructed by your care team. These medications may hide a fever. Do not treat diarrhea with over the counter products. Contact your care team if you have diarrhea that lasts more than 2 days or if it is severe and watery. This medication can make you more sensitive to the sun. Keep out of the sun. If you cannot avoid being in the sun, wear protective clothing and sunscreen. Do not use sun lamps, tanning beds, or tanning booths. Talk to   your care team if you or your partner wish to become pregnant or think you might be pregnant. This medication can cause serious birth defects if taken during pregnancy and for 3 months after the last dose. A reliable form of contraception is recommended while taking this  medication and for 3 months after the last dose. Talk to your care team about effective forms of contraception. Do not father a child while taking this medication and for 3 months after the last dose. Use a condom while having sex during this time period. Do not breastfeed while taking this medication. This medication may cause infertility. Talk to your care team if you are concerned about your fertility. What side effects may I notice from receiving this medication? Side effects that you should report to your care team as soon as possible: Allergic reactions--skin rash, itching, hives, swelling of the face, lips, tongue, or throat Heart attack--pain or tightness in the chest, shoulders, arms, or jaw, nausea, shortness of breath, cold or clammy skin, feeling faint or lightheaded Heart failure--shortness of breath, swelling of the ankles, feet, or hands, sudden weight gain, unusual weakness or fatigue Heart rhythm changes--fast or irregular heartbeat, dizziness, feeling faint or lightheaded, chest pain, trouble breathing High ammonia level--unusual weakness or fatigue, confusion, loss of appetite, nausea, vomiting, seizures Infection--fever, chills, cough, sore throat, wounds that don't heal, pain or trouble when passing urine, general feeling of discomfort or being unwell Low red blood cell level--unusual weakness or fatigue, dizziness, headache, trouble breathing Pain, tingling, or numbness in the hands or feet, muscle weakness, change in vision, confusion or trouble speaking, loss of balance or coordination, trouble walking, seizures Redness, swelling, and blistering of the skin over hands and feet Severe or prolonged diarrhea Unusual bruising or bleeding Side effects that usually do not require medical attention (report to your care team if they continue or are bothersome): Dry skin Headache Increased tears Nausea Pain, redness, or swelling with sores inside the mouth or throat Sensitivity  to light Vomiting This list may not describe all possible side effects. Call your doctor for medical advice about side effects. You may report side effects to FDA at 1-800-FDA-1088. Where should I keep my medication? This medication is given in a hospital or clinic. It will not be stored at home. NOTE: This sheet is a summary. It may not cover all possible information. If you have questions about this medicine, talk to your doctor, pharmacist, or health care provider.  2023 Elsevier/Gold Standard (2021-09-16 00:00:00) Bevacizumab Injection What is this medication? BEVACIZUMAB (be va SIZ yoo mab) treats some types of cancer. It works by blocking a protein that causes cancer cells to grow and multiply. This helps to slow or stop the spread of cancer cells. It is a monoclonal antibody. This medicine may be used for other purposes; ask your health care provider or pharmacist if you have questions. COMMON BRAND NAME(S): Alymsys, Avastin, MVASI, Noah Charon What should I tell my care team before I take this medication? They need to know if you have any of these conditions: Blood clots Coughing up blood Having or recent surgery Heart failure High blood pressure History of a connection between 2 or more body parts that do not usually connect (fistula) History of a tear in your stomach or intestines Protein in your urine An unusual or allergic reaction to bevacizumab, other medications, foods, dyes, or preservatives Pregnant or trying to get pregnant Breast-feeding How should I use this medication? This medication is injected into a  vein. It is given by your care team in a hospital or clinic setting. Talk to your care team the use of this medication in children. Special care may be needed. Overdosage: If you think you have taken too much of this medicine contact a poison control center or emergency room at once. NOTE: This medicine is only for you. Do not share this medicine with others. What if I  miss a dose? Keep appointments for follow-up doses. It is important not to miss your dose. Call your care team if you are unable to keep an appointment. What may interact with this medication? Interactions are not expected. This list may not describe all possible interactions. Give your health care provider a list of all the medicines, herbs, non-prescription drugs, or dietary supplements you use. Also tell them if you smoke, drink alcohol, or use illegal drugs. Some items may interact with your medicine. What should I watch for while using this medication? Your condition will be monitored carefully while you are receiving this medication. You may need blood work while taking this medication. This medication may make you feel generally unwell. This is not uncommon as chemotherapy can affect healthy cells as well as cancer cells. Report any side effects. Continue your course of treatment even though you feel ill unless your care team tells you to stop. This medication may increase your risk to bruise or bleed. Call your care team if you notice any unusual bleeding. Before having surgery, talk to your care team to make sure it is ok. This medication can increase the risk of poor healing of your surgical site or wound. You will need to stop this medication for 28 days before surgery. After surgery, wait at least 28 days before restarting this medication. Make sure the surgical site or wound is healed enough before restarting this medication. Talk to your care team if questions. Talk to your care team if you may be pregnant. Serious birth defects can occur if you take this medication during pregnancy and for 6 months after the last dose. Contraception is recommended while taking this medication and for 6 months after the last dose. Your care team can help you find the option that works for you. Do not breastfeed while taking this medication and for 6 months after the last dose. This medication can cause  infertility. Talk to your care team if you are concerned about your fertility. What side effects may I notice from receiving this medication? Side effects that you should report to your care team as soon as possible: Allergic reactions--skin rash, itching, hives, swelling of the face, lips, tongue, or throat Bleeding--bloody or black, tar-like stools, vomiting blood or brown material that looks like coffee grounds, red or dark brown urine, small red or purple spots on skin, unusual bruising or bleeding Blood clot--pain, swelling, or warmth in the leg, shortness of breath, chest pain Heart attack--pain or tightness in the chest, shoulders, arms, or jaw, nausea, shortness of breath, cold or clammy skin, feeling faint or lightheaded Heart failure--shortness of breath, swelling of the ankles, feet, or hands, sudden weight gain, unusual weakness or fatigue Increase in blood pressure Infection--fever, chills, cough, sore throat, wounds that don't heal, pain or trouble when passing urine, general feeling of discomfort or being unwell Infusion reactions--chest pain, shortness of breath or trouble breathing, feeling faint or lightheaded Kidney injury--decrease in the amount of urine, swelling of the ankles, hands, or feet Stomach pain that is severe, does not go away, or gets  worse Stroke--sudden numbness or weakness of the face, arm, or leg, trouble speaking, confusion, trouble walking, loss of balance or coordination, dizziness, severe headache, change in vision Sudden and severe headache, confusion, change in vision, seizures, which may be signs of posterior reversible encephalopathy syndrome (PRES) Side effects that usually do not require medical attention (report to your care team if they continue or are bothersome): Back pain Change in taste Diarrhea Dry skin Increased tears Nosebleed This list may not describe all possible side effects. Call your doctor for medical advice about side effects. You  may report side effects to FDA at 1-800-FDA-1088. Where should I keep my medication? This medication is given in a hospital or clinic. It will not be stored at home. NOTE: This sheet is a summary. It may not cover all possible information. If you have questions about this medicine, talk to your doctor, pharmacist, or health care provider.  2023 Elsevier/Gold Standard (2021-09-19 00:00:00) Irinotecan Injection What is this medication? IRINOTECAN (ir in oh TEE kan) treats some types of cancer. It works by slowing down the growth of cancer cells. This medicine may be used for other purposes; ask your health care provider or pharmacist if you have questions. COMMON BRAND NAME(S): Camptosar What should I tell my care team before I take this medication? They need to know if you have any of these conditions: Dehydration Diarrhea Infection, especially a viral infection, such as chickenpox, cold sores, herpes Liver disease Low blood cell levels (white cells, red cells, and platelets) Low levels of electrolytes, such as calcium, magnesium, or potassium in your blood Recent or ongoing radiation An unusual or allergic reaction to irinotecan, other medications, foods, dyes, or preservatives If you or your partner are pregnant or trying to get pregnant Breast-feeding How should I use this medication? This medication is injected into a vein. It is given by your care team in a hospital or clinic setting. Talk to your care team about the use of this medication in children. Special care may be needed. Overdosage: If you think you have taken too much of this medicine contact a poison control center or emergency room at once. NOTE: This medicine is only for you. Do not share this medicine with others. What if I miss a dose? Keep appointments for follow-up doses. It is important not to miss your dose. Call your care team if you are unable to keep an appointment. What may interact with this medication? Do  not take this medication with any of the following: Cobicistat Itraconazole This medication may also interact with the following: Certain antibiotics, such as clarithromycin, rifampin, rifabutin Certain antivirals for HIV or AIDS Certain medications for fungal infections, such as ketoconazole, posaconazole, voriconazole Certain medications for seizures, such as carbamazepine, phenobarbital, phenytoin Gemfibrozil Nefazodone St. John's wort This list may not describe all possible interactions. Give your health care provider a list of all the medicines, herbs, non-prescription drugs, or dietary supplements you use. Also tell them if you smoke, drink alcohol, or use illegal drugs. Some items may interact with your medicine. What should I watch for while using this medication? Your condition will be monitored carefully while you are receiving this medication. You may need blood work while taking this medication. This medication may make you feel generally unwell. This is not uncommon as chemotherapy can affect healthy cells as well as cancer cells. Report any side effects. Continue your course of treatment even though you feel ill unless your care team tells you to stop. This  medication can cause serious side effects. To reduce the risk, your care team may give you other medications to take before receiving this one. Be sure to follow the directions from your care team. This medication may affect your coordination, reaction time, or judgement. Do not drive or operate machinery until you know how this medication affects you. Sit up or stand slowly to reduce the risk of dizzy or fainting spells. Drinking alcohol with this medication can increase the risk of these side effects. This medication may increase your risk of getting an infection. Call your care team for advice if you get a fever, chills, sore throat, or other symptoms of a cold or flu. Do not treat yourself. Try to avoid being around people who  are sick. Avoid taking medications that contain aspirin, acetaminophen, ibuprofen, naproxen, or ketoprofen unless instructed by your care team. These medications may hide a fever. This medication may increase your risk to bruise or bleed. Call your care team if you notice any unusual bleeding. Be careful brushing or flossing your teeth or using a toothpick because you may get an infection or bleed more easily. If you have any dental work done, tell your dentist you are receiving this medication. Talk to your care team if you or your partner are pregnant or think either of you might be pregnant. This medication can cause serious birth defects if taken during pregnancy and for 6 months after the last dose. You will need a negative pregnancy test before starting this medication. Contraception is recommended while taking this medication and for 6 months after the last dose. Your care team can help you find the option that works for you. Do not father a child while taking this medication and for 3 months after the last dose. Use a condom for contraception during this time period. Do not breastfeed while taking this medication and for 7 days after the last dose. This medication may cause infertility. Talk to your care team if you are concerned about your fertility. What side effects may I notice from receiving this medication? Side effects that you should report to your care team as soon as possible: Allergic reactions--skin rash, itching, hives, swelling of the face, lips, tongue, or throat Dry cough, shortness of breath or trouble breathing Increased saliva or tears, increased sweating, stomach cramping, diarrhea, small pupils, unusual weakness or fatigue, slow heartbeat Infection--fever, chills, cough, sore throat, wounds that don't heal, pain or trouble when passing urine, general feeling of discomfort or being unwell Kidney injury--decrease in the amount of urine, swelling of the ankles, hands, or  feet Low red blood cell level--unusual weakness or fatigue, dizziness, headache, trouble breathing Severe or prolonged diarrhea Unusual bruising or bleeding Side effects that usually do not require medical attention (report to your care team if they continue or are bothersome): Constipation Diarrhea Hair loss Loss of appetite Nausea Stomach pain This list may not describe all possible side effects. Call your doctor for medical advice about side effects. You may report side effects to FDA at 1-800-FDA-1088. Where should I keep my medication? This medication is given in a hospital or clinic. It will not be stored at home. NOTE: This sheet is a summary. It may not cover all possible information. If you have questions about this medicine, talk to your doctor, pharmacist, or health care provider.  2023 Elsevier/Gold Standard (2021-09-25 00:00:00)

## 2022-05-05 NOTE — Progress Notes (Signed)
Patient reports cold symptoms that have lasted over 1 week- [name] in to evaluate patient. Ok to proceed with treatment

## 2022-05-06 ENCOUNTER — Ambulatory Visit: Payer: 59

## 2022-05-07 ENCOUNTER — Inpatient Hospital Stay: Payer: 59

## 2022-05-07 ENCOUNTER — Ambulatory Visit: Payer: 59

## 2022-05-07 VITALS — BP 115/83 | HR 95 | Temp 97.6°F | Resp 20

## 2022-05-07 DIAGNOSIS — Z5111 Encounter for antineoplastic chemotherapy: Secondary | ICD-10-CM | POA: Diagnosis not present

## 2022-05-07 DIAGNOSIS — C7801 Secondary malignant neoplasm of right lung: Secondary | ICD-10-CM | POA: Diagnosis not present

## 2022-05-07 DIAGNOSIS — C182 Malignant neoplasm of ascending colon: Secondary | ICD-10-CM

## 2022-05-07 DIAGNOSIS — C786 Secondary malignant neoplasm of retroperitoneum and peritoneum: Secondary | ICD-10-CM | POA: Diagnosis not present

## 2022-05-07 DIAGNOSIS — C7931 Secondary malignant neoplasm of brain: Secondary | ICD-10-CM | POA: Diagnosis not present

## 2022-05-07 DIAGNOSIS — C189 Malignant neoplasm of colon, unspecified: Secondary | ICD-10-CM

## 2022-05-07 DIAGNOSIS — C7802 Secondary malignant neoplasm of left lung: Secondary | ICD-10-CM | POA: Diagnosis not present

## 2022-05-07 DIAGNOSIS — C7800 Secondary malignant neoplasm of unspecified lung: Secondary | ICD-10-CM

## 2022-05-07 DIAGNOSIS — Z5189 Encounter for other specified aftercare: Secondary | ICD-10-CM | POA: Diagnosis not present

## 2022-05-07 DIAGNOSIS — C7971 Secondary malignant neoplasm of right adrenal gland: Secondary | ICD-10-CM | POA: Diagnosis not present

## 2022-05-07 MED ORDER — HEPARIN SOD (PORK) LOCK FLUSH 100 UNIT/ML IV SOLN
500.0000 [IU] | Freq: Once | INTRAVENOUS | Status: AC | PRN
  Administered 2022-05-07: 500 [IU]

## 2022-05-07 MED ORDER — SODIUM CHLORIDE 0.9% FLUSH
10.0000 mL | INTRAVENOUS | Status: DC | PRN
  Administered 2022-05-07: 10 mL

## 2022-05-07 NOTE — Patient Instructions (Signed)
The chemotherapy medication bag should finish at 46 hours, 96 hours, or 7 days. For example, if your pump is scheduled for 46 hours and it was put on at 4:00 p.m., it should finish at 2:00 p.m. the day it is scheduled to come off regardless of your appointment time.        If the display on your pump reads "Low Volume" and it is beeping, take the batteries out of the pump and come to the cancer center for it to be taken off.   If the pump alarms go off prior to the pump reading "Low Volume" then call 1-800-315-3287 and someone can assist you.  If the plunger comes out and the chemotherapy medication is leaking out, please use your home chemo spill kit to clean up the spill. Do NOT use paper towels or other household products.  If you have problems or questions regarding your pump, please call either 1-800-315-3287 (24 hours a day) or the cancer center Monday-Friday 8:00 a.m.- 4:30 p.m. at the clinic number and we will assist you. If you are unable to get assistance, then go to the nearest Emergency Department and ask the staff to contact the IV team for assistance.    

## 2022-05-09 ENCOUNTER — Encounter: Payer: Self-pay | Admitting: Oncology

## 2022-05-09 ENCOUNTER — Other Ambulatory Visit: Payer: Self-pay | Admitting: Psychiatry

## 2022-05-09 DIAGNOSIS — F333 Major depressive disorder, recurrent, severe with psychotic symptoms: Secondary | ICD-10-CM

## 2022-05-09 DIAGNOSIS — F422 Mixed obsessional thoughts and acts: Secondary | ICD-10-CM

## 2022-05-11 IMAGING — MR MR HEAD WO/W CM
14 of 16 series · 40 of 48 positions shown · IV contrast (gadavist)
Comparison: MRI May 27, 2020.

CLINICAL DATA: Brain mass or lesion. Status post craniotomy for
brain mass.

EXAM:
MRI HEAD WITHOUT AND WITH CONTRAST
TECHNIQUE: Multiplanar, multiecho pulse sequences of the brain and surrounding
structures were obtained without and with intravenous contrast.
CONTRAST:  8mL GADAVIST GADOBUTROL 1 MMOL/ML IV SOLN

[Series 5: DWI · coronal · 4.0mm · 0.88mm/px · 3 of 70 slices shown (1 of 4)]
[im 1/70]
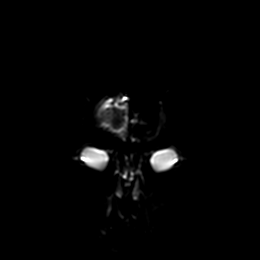
[im 35/70]
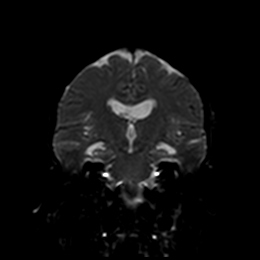
[im 70/70]
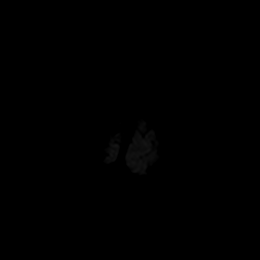

[Series 6: DWI · coronal · 4.0mm · 0.88mm/px · 2 of 35 slices shown (2 of 4)]
[im 1/35]
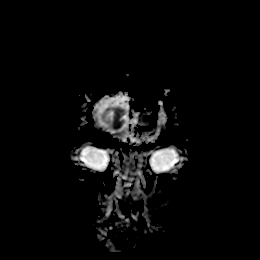
[im 35/35]
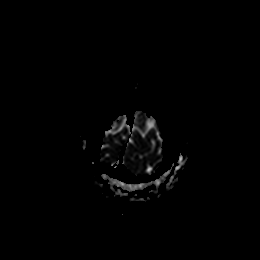

[Series 7: DWI · axial · 3.0mm · 0.88mm/px · z∈[-121,+31]mm · 6 of 104 slices shown (3 of 4)]
[im 1/104]
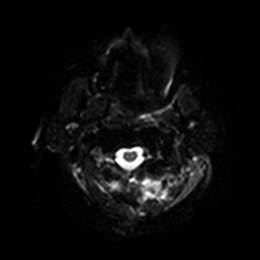
[im 21/104]
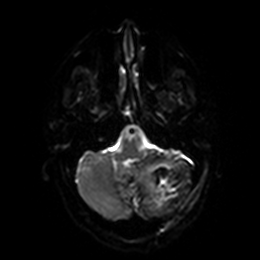
[im 42/104]
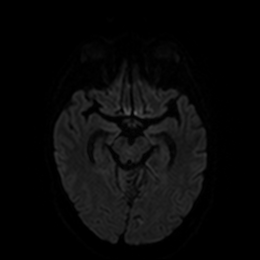
[im 62/104]
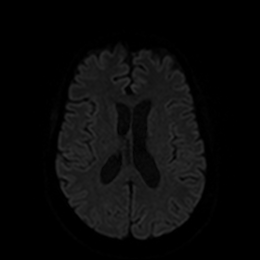
[im 83/104]
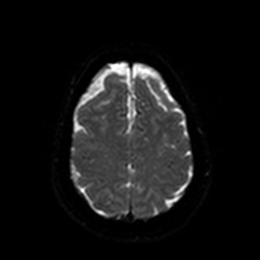
[im 104/104]
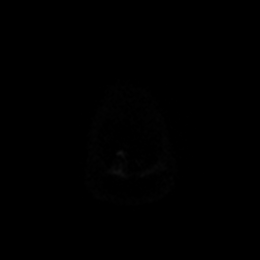

[Series 8: DWI · axial · 3.0mm · 0.88mm/px · z∈[-121,+31]mm · 3 of 52 slices shown (4 of 4)]
[im 1/52]
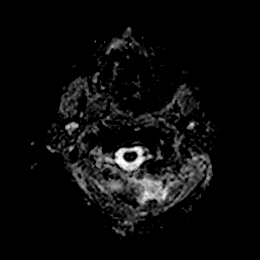
[im 26/52]
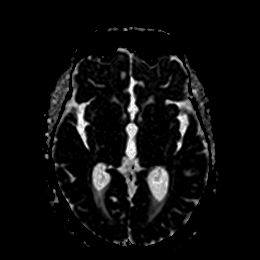
[im 52/52]
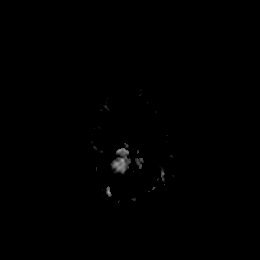

[Series 9: T1 · sagittal · 5.0mm · 0.75mm/px · 1 of 23 slices shown]
[im 1/23]
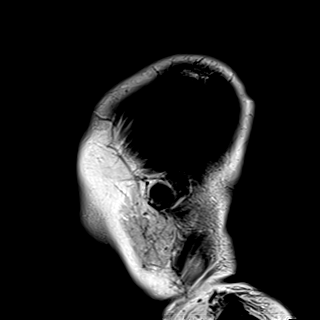

[Series 10: T2 · axial · 5.0mm · 0.72mm/px · z∈[-128,+33]mm · 2 of 28 slices shown (1 of 2)]
[im 1/28]
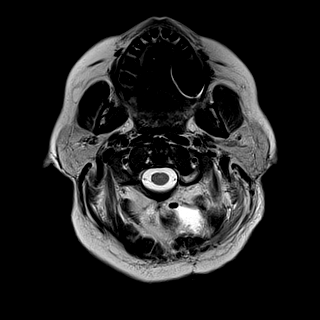
[im 28/28]
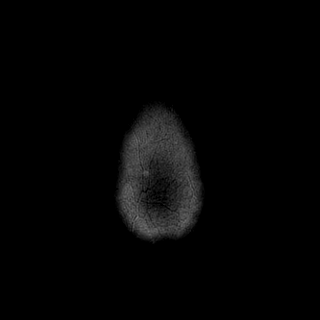

[Series 11: FLAIR · axial · 5.0mm · 0.45mm/px · z∈[-129,+32]mm · 2 of 28 slices shown]
[im 1/28]
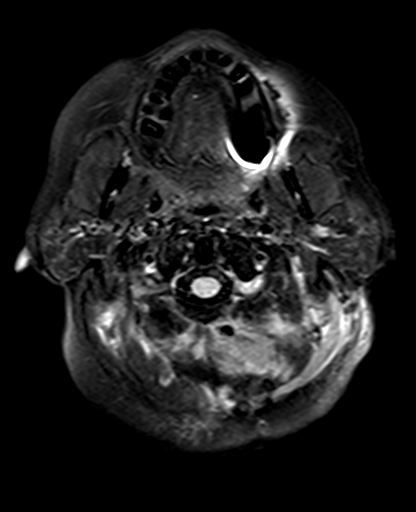
[im 28/28]
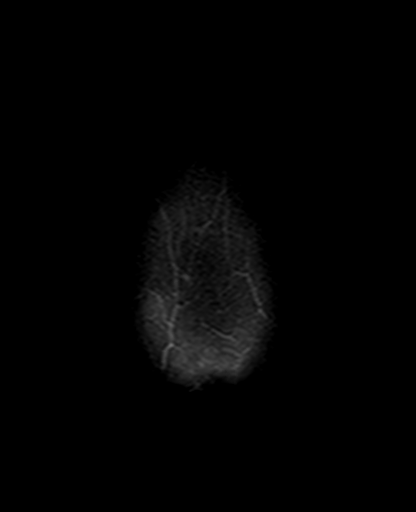

[Series 12: mag_images · axial · 3.0mm · 0.90mm/px · z∈[-129,+47]mm · 4 of 60 slices shown]
[im 1/60]
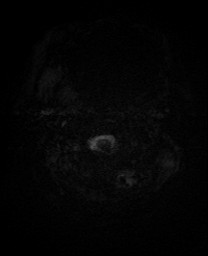
[im 20/60]
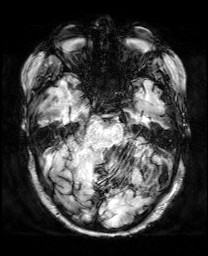
[im 40/60]
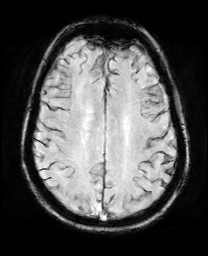
[im 60/60]
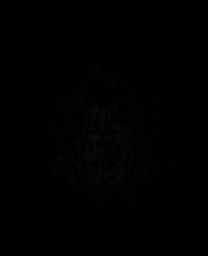

[Series 13: pha_images · axial · 3.0mm · 0.90mm/px · z∈[-129,+44]mm · 4 of 59 slices shown]
[im 1/59]
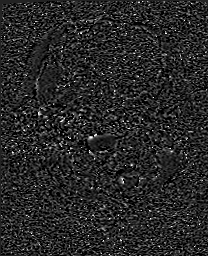
[im 20/59]
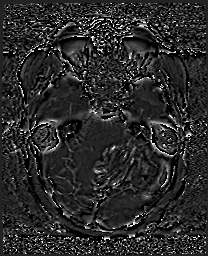
[im 39/59]
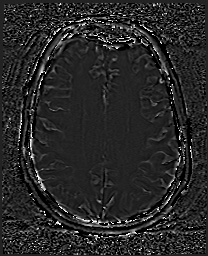
[im 59/59]
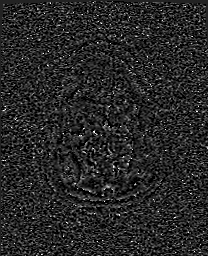

[Series 14: swi_images · axial · 3.0mm · 0.90mm/px · z∈[-129,+47]mm · 4 of 60 slices shown]
[im 1/60]
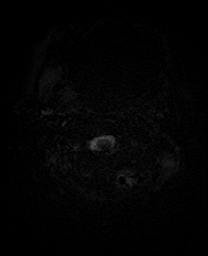
[im 20/60]
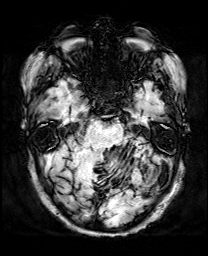
[im 40/60]
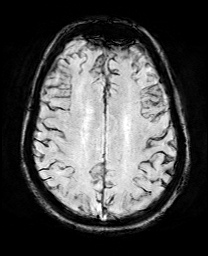
[im 60/60]
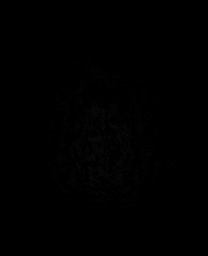

[Series 15: mip_images(sw) · axial · 24.0mm · 0.90mm/px · z∈[-119,+37]mm · 3 of 53 slices shown]
[im 1/53]
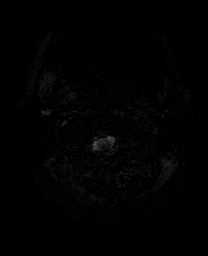
[im 27/53]
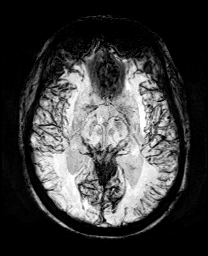
[im 53/53]
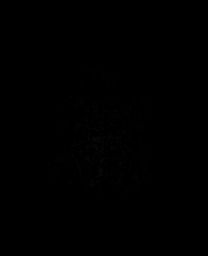

[Series 18: T2 · coronal · 5.0mm · 0.34mm/px · 2 of 32 slices shown (2 of 2)]
[im 1/32]
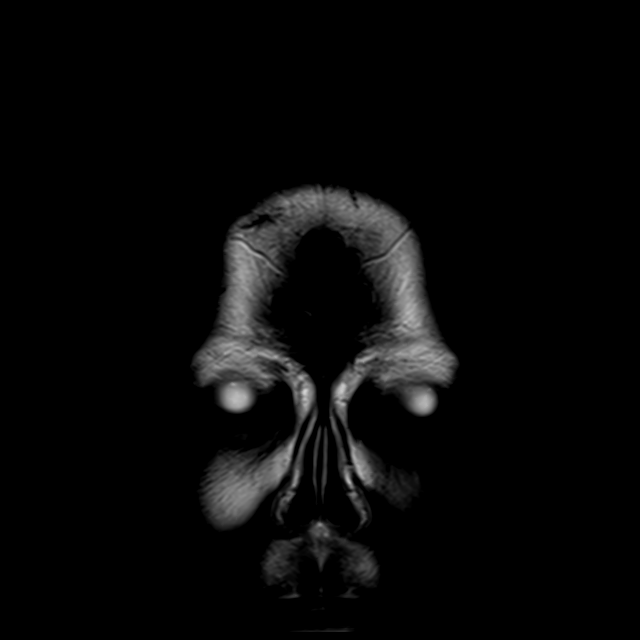
[im 32/32]
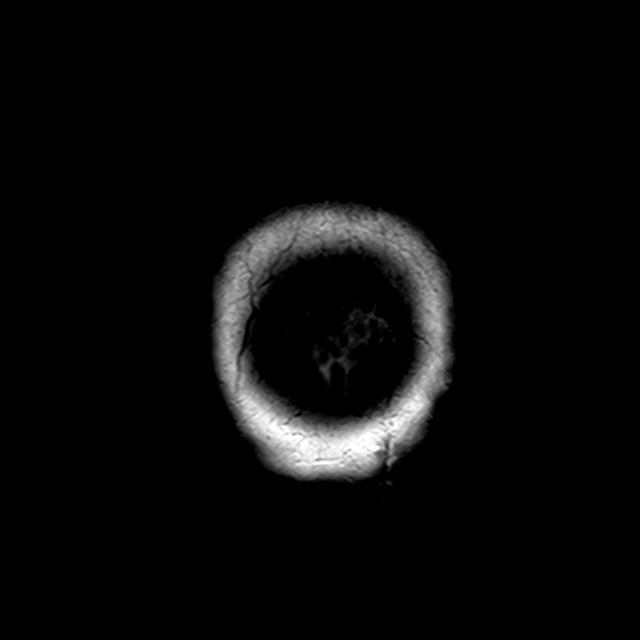

[Series 20: T1 post-contrast · coronal · 5.0mm · 0.34mm/px · 2 of 32 slices shown (1 of 2)]
[im 1/32]
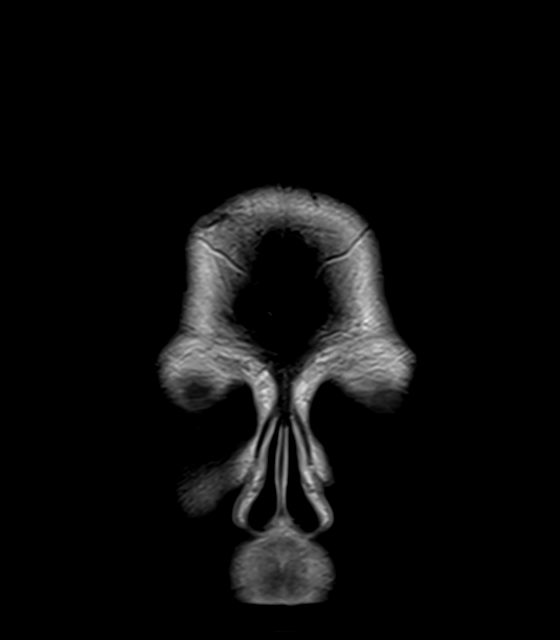
[im 32/32]
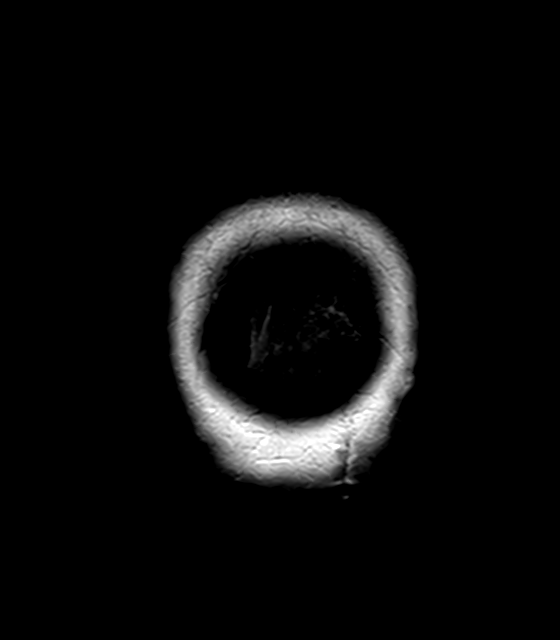

[Series 21: T1 post-contrast · sagittal · 5.0mm · 0.72mm/px · 2 of 26 slices shown (2 of 2)]
[im 1/26]
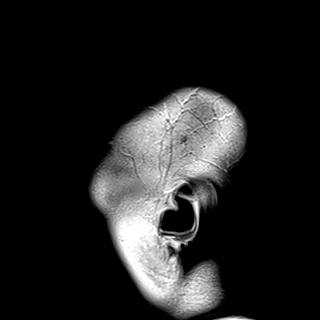
[im 26/26]
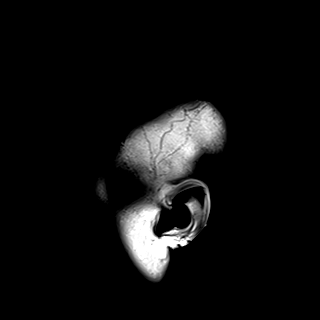

[40 of 48 positions shown; findings below may reference images not displayed]

FINDINGS: Brain: Interval resection of the previously described left
cerebellar mass. Evaluation is limited by the iron throughout the
left cerebellum from prior Feraheme injection. Post-contrast is
imaging is particularly limited within these limitations, no
specific evidence of residual tumor. T2 hypointense area in the
anterior aspect of the resection cavity is favored to represent an
air bubble. There is expected blood products within the resection
cavity and expected devitalized tissue. Redemonstrated left
cerebellar edema with T1 hyperintensity from Feraheme staining.
Decreased mass effect and decreased effacement of the fourth
ventricle. The ventricular system remains mildly dilated, but this
is slightly improved from the prior. Mild periventricular edema. No
midline shift. Basal cisterns are patent.

Vascular: Major arterial flow voids are maintained at the skull
base.

Skull and upper cervical spine: Left occipital craniectomy.
Heterogeneous marrow signal without evidence of focal marrow
replacing lesion.

Sinuses/Orbits: Mild paranasal sinus mucosal thickening without
air-fluid levels. Unremarkable orbits.

Other: Trace bilateral mastoid effusions.
IMPRESSION: 1. Interval resection of a left cerebellar mass. Evaluation is
limited by iron throughout the left cerebellum from prior Feraheme
injection. Post-contrast is imaging is particularly limited. Within
these limitations, no specific evidence of residual tumor. T2
hypointense area in the anterior aspect of the resection cavity is
favored to represent an air bubble, but warrants attention on
follow.
2. Redemonstrated left cerebellar edema with decreased mass effect
and decreased effacement of the fourth ventricle. The ventricular
system remains dilated, but this is slightly improved from the
prior. Similar mild periventricular edema.

## 2022-05-12 ENCOUNTER — Inpatient Hospital Stay: Payer: 59

## 2022-05-12 ENCOUNTER — Encounter: Payer: Self-pay | Admitting: Oncology

## 2022-05-12 VITALS — BP 103/74 | HR 103 | Resp 20

## 2022-05-12 DIAGNOSIS — C182 Malignant neoplasm of ascending colon: Secondary | ICD-10-CM

## 2022-05-12 DIAGNOSIS — C7971 Secondary malignant neoplasm of right adrenal gland: Secondary | ICD-10-CM | POA: Diagnosis not present

## 2022-05-12 DIAGNOSIS — Z5111 Encounter for antineoplastic chemotherapy: Secondary | ICD-10-CM | POA: Diagnosis not present

## 2022-05-12 DIAGNOSIS — C7931 Secondary malignant neoplasm of brain: Secondary | ICD-10-CM | POA: Diagnosis not present

## 2022-05-12 DIAGNOSIS — Z5189 Encounter for other specified aftercare: Secondary | ICD-10-CM | POA: Diagnosis not present

## 2022-05-12 DIAGNOSIS — C786 Secondary malignant neoplasm of retroperitoneum and peritoneum: Secondary | ICD-10-CM | POA: Diagnosis not present

## 2022-05-12 DIAGNOSIS — C7802 Secondary malignant neoplasm of left lung: Secondary | ICD-10-CM | POA: Diagnosis not present

## 2022-05-12 DIAGNOSIS — C7801 Secondary malignant neoplasm of right lung: Secondary | ICD-10-CM | POA: Diagnosis not present

## 2022-05-12 DIAGNOSIS — C189 Malignant neoplasm of colon, unspecified: Secondary | ICD-10-CM

## 2022-05-12 MED ORDER — FILGRASTIM-SNDZ 480 MCG/0.8ML IJ SOSY
480.0000 ug | PREFILLED_SYRINGE | Freq: Once | INTRAMUSCULAR | Status: AC
  Administered 2022-05-12: 480 ug via SUBCUTANEOUS
  Filled 2022-05-12: qty 0.8

## 2022-05-12 NOTE — Patient Instructions (Signed)
Filgrastim Injection What is this medication? FILGRASTIM (fil GRA stim) lowers the risk of infection in people who are receiving chemotherapy. It works by helping your body make more white blood cells, which protects your body from infection. It may also be used to help people who have been exposed to high doses of radiation. It can be used to help prepare your body before a stem cell transplant. It works by helping your bone marrow make and release stem cells into the blood. This medicine may be used for other purposes; ask your health care provider or pharmacist if you have questions. COMMON BRAND NAME(S): Neupogen, Nivestym, Releuko, Zarxio What should I tell my care team before I take this medication? They need to know if you have any of these conditions: History of blood diseases, such as sickle cell anemia Kidney disease Recent or ongoing radiation An unusual or allergic reaction to filgrastim, pegfilgrastim, latex, rubber, other medications, foods, dyes, or preservatives Pregnant or trying to get pregnant Breast-feeding How should I use this medication? This medication is injected under the skin or into a vein. It is usually given by your care team in a hospital or clinic setting. It may be given at home. If you get this medication at home, you will be taught how to prepare and give it. Use exactly as directed. Take it as directed on the prescription label at the same time every day. Keep taking it unless your care team tells you to stop. It is important that you put your used needles and syringes in a special sharps container. Do not put them in a trash can. If you do not have a sharps container, call your pharmacist or care team to get one. This medication comes with INSTRUCTIONS FOR USE. Ask your pharmacist for directions on how to use this medication. Read the information carefully. Talk to your pharmacist or care team if you have questions. Talk to your care team about the use of this  medication in children. While it may be prescribed for children for selected conditions, precautions do apply. Overdosage: If you think you have taken too much of this medicine contact a poison control center or emergency room at once. NOTE: This medicine is only for you. Do not share this medicine with others. What if I miss a dose? It is important not to miss any doses. Talk to your care team about what to do if you miss a dose. What may interact with this medication? Medications that may cause a release of neutrophils, such as lithium This list may not describe all possible interactions. Give your health care provider a list of all the medicines, herbs, non-prescription drugs, or dietary supplements you use. Also tell them if you smoke, drink alcohol, or use illegal drugs. Some items may interact with your medicine. What should I watch for while using this medication? Your condition will be monitored carefully while you are receiving this medication. You may need bloodwork while taking this medication. Talk to your care team about your risk of cancer. You may be more at risk for certain types of cancer if you take this medication. What side effects may I notice from receiving this medication? Side effects that you should report to your care team as soon as possible: Allergic reactions--skin rash, itching, hives, swelling of the face, lips, tongue, or throat Capillary leak syndrome--stomach or muscle pain, unusual weakness or fatigue, feeling faint or lightheaded, decrease in the amount of urine, swelling of the ankles, hands, or   feet, trouble breathing High white blood cell level--fever, fatigue, trouble breathing, night sweats, change in vision, weight loss Inflammation of the aorta--fever, fatigue, back, chest, or stomach pain, severe headache Kidney injury (glomerulonephritis)--decrease in the amount of urine, red or dark brown urine, foamy or bubbly urine, swelling of the ankles, hands, or  feet Shortness of breath or trouble breathing Spleen injury--pain in upper left stomach or shoulder Unusual bruising or bleeding Side effects that usually do not require medical attention (report to your care team if they continue or are bothersome): Back pain Bone pain Fatigue Fever Headache Nausea This list may not describe all possible side effects. Call your doctor for medical advice about side effects. You may report side effects to FDA at 1-800-FDA-1088. Where should I keep my medication? Keep out of the reach of children and pets. Keep this medication in the original packaging until you are ready to take it. Protect from light. See product for storage information. Each product may have different instructions. Get rid of any unused medication after the expiration date. To get rid of medications that are no longer needed or have expired: Take the medication to a medications take-back program. Check with your pharmacy or law enforcement to find a location. If you cannot return the medication, ask your pharmacist or care team how to get rid of this medication safely. NOTE: This sheet is a summary. It may not cover all possible information. If you have questions about this medicine, talk to your doctor, pharmacist, or health care provider.  2023 Elsevier/Gold Standard (2021-08-26 00:00:00)  

## 2022-05-13 ENCOUNTER — Inpatient Hospital Stay: Payer: 59

## 2022-05-14 ENCOUNTER — Inpatient Hospital Stay: Payer: 59

## 2022-05-14 VITALS — BP 101/83 | HR 110 | Temp 98.9°F | Resp 18 | Wt 162.0 lb

## 2022-05-14 DIAGNOSIS — Z5189 Encounter for other specified aftercare: Secondary | ICD-10-CM | POA: Diagnosis not present

## 2022-05-14 DIAGNOSIS — C786 Secondary malignant neoplasm of retroperitoneum and peritoneum: Secondary | ICD-10-CM | POA: Diagnosis not present

## 2022-05-14 DIAGNOSIS — C189 Malignant neoplasm of colon, unspecified: Secondary | ICD-10-CM | POA: Diagnosis not present

## 2022-05-14 DIAGNOSIS — C7802 Secondary malignant neoplasm of left lung: Secondary | ICD-10-CM | POA: Diagnosis not present

## 2022-05-14 DIAGNOSIS — C7971 Secondary malignant neoplasm of right adrenal gland: Secondary | ICD-10-CM | POA: Diagnosis not present

## 2022-05-14 DIAGNOSIS — C7931 Secondary malignant neoplasm of brain: Secondary | ICD-10-CM | POA: Diagnosis not present

## 2022-05-14 DIAGNOSIS — C787 Secondary malignant neoplasm of liver and intrahepatic bile duct: Secondary | ICD-10-CM

## 2022-05-14 DIAGNOSIS — Z5111 Encounter for antineoplastic chemotherapy: Secondary | ICD-10-CM | POA: Diagnosis not present

## 2022-05-14 DIAGNOSIS — C182 Malignant neoplasm of ascending colon: Secondary | ICD-10-CM

## 2022-05-14 DIAGNOSIS — C7801 Secondary malignant neoplasm of right lung: Secondary | ICD-10-CM | POA: Diagnosis not present

## 2022-05-14 MED ORDER — FILGRASTIM-SNDZ 480 MCG/0.8ML IJ SOSY
480.0000 ug | PREFILLED_SYRINGE | Freq: Once | INTRAMUSCULAR | Status: AC
  Administered 2022-05-14: 480 ug via SUBCUTANEOUS
  Filled 2022-05-14: qty 0.8

## 2022-05-15 ENCOUNTER — Inpatient Hospital Stay: Payer: 59

## 2022-05-15 VITALS — BP 115/77 | HR 115 | Temp 98.0°F | Resp 18 | Wt 162.0 lb

## 2022-05-15 DIAGNOSIS — C189 Malignant neoplasm of colon, unspecified: Secondary | ICD-10-CM

## 2022-05-15 DIAGNOSIS — C182 Malignant neoplasm of ascending colon: Secondary | ICD-10-CM

## 2022-05-15 DIAGNOSIS — C7931 Secondary malignant neoplasm of brain: Secondary | ICD-10-CM | POA: Diagnosis not present

## 2022-05-15 DIAGNOSIS — C7971 Secondary malignant neoplasm of right adrenal gland: Secondary | ICD-10-CM | POA: Diagnosis not present

## 2022-05-15 DIAGNOSIS — Z5111 Encounter for antineoplastic chemotherapy: Secondary | ICD-10-CM | POA: Diagnosis not present

## 2022-05-15 DIAGNOSIS — Z5189 Encounter for other specified aftercare: Secondary | ICD-10-CM | POA: Diagnosis not present

## 2022-05-15 DIAGNOSIS — C7801 Secondary malignant neoplasm of right lung: Secondary | ICD-10-CM | POA: Diagnosis not present

## 2022-05-15 DIAGNOSIS — C7802 Secondary malignant neoplasm of left lung: Secondary | ICD-10-CM | POA: Diagnosis not present

## 2022-05-15 DIAGNOSIS — C786 Secondary malignant neoplasm of retroperitoneum and peritoneum: Secondary | ICD-10-CM | POA: Diagnosis not present

## 2022-05-15 IMAGING — DX DG CHEST 2V
2 series · 2 of 2 positions shown · non-contrast
Comparison: 03/25/2017

CLINICAL DATA: Cough.

EXAM:
CHEST - 2 VIEW

[chest lat]
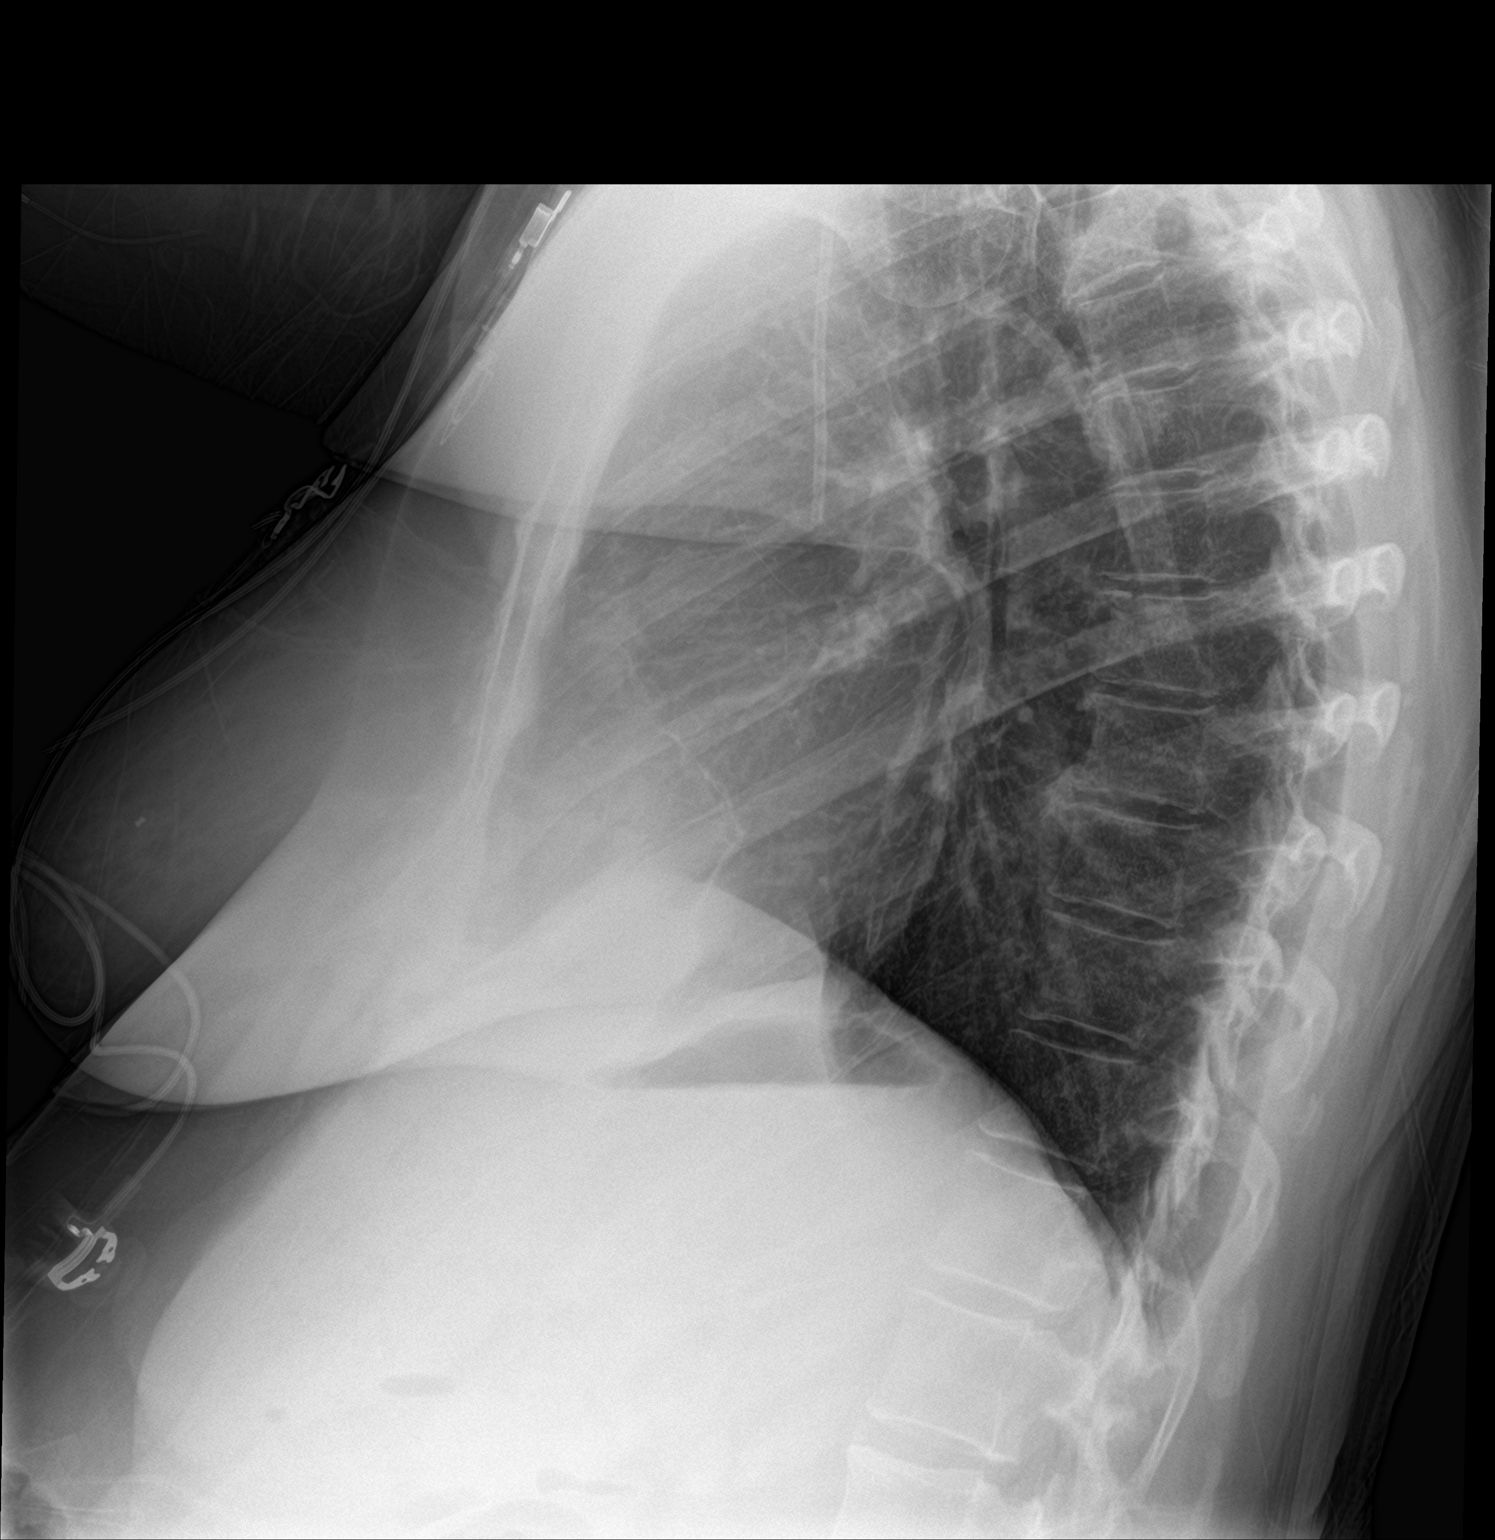

[chest ap]
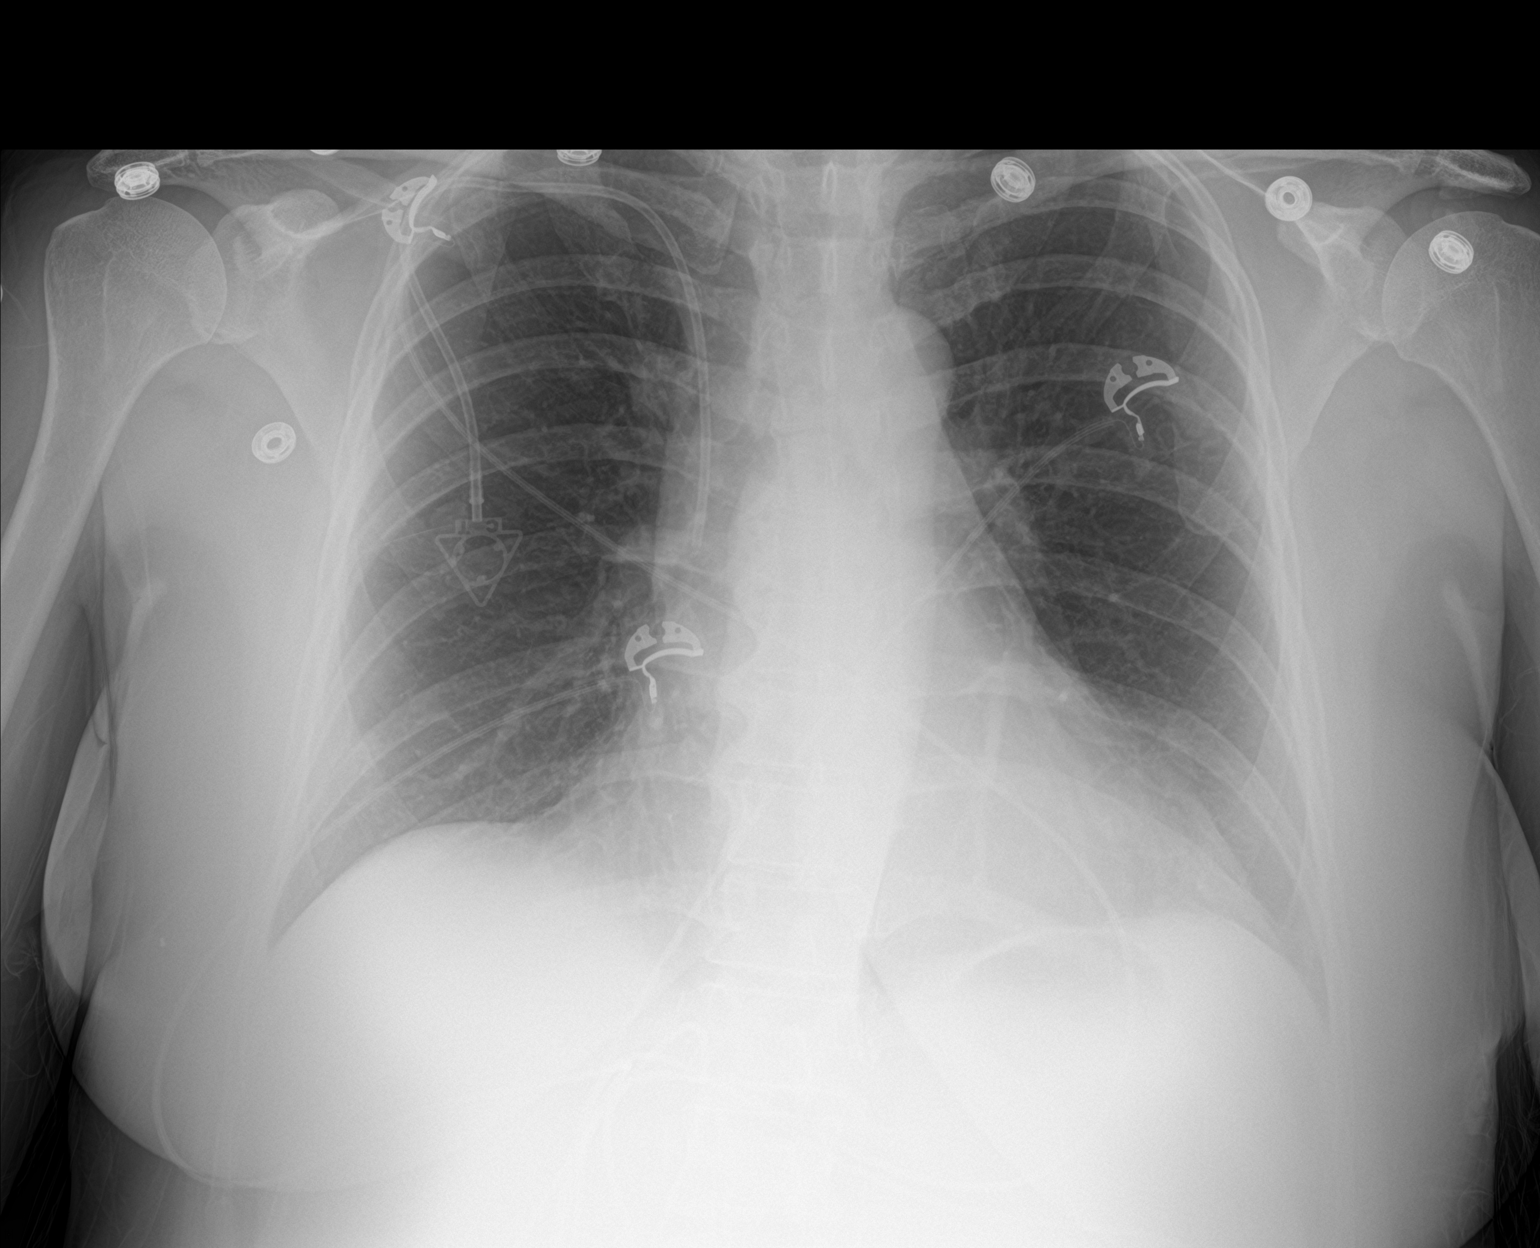

[2 of 2 positions shown; findings below may reference images not displayed]

FINDINGS: Cardiac silhouette is normal in size. No mediastinal or hilar masses
or evidence of adenopathy.

Linear opacities project in the anterior lung base on the lateral
view consistent with scarring. Lungs otherwise clear.

No pleural effusion or pneumothorax.

Right anterior chest wall Port-A-Cath is stable.

Skeletal structures are intact.
IMPRESSION: No active cardiopulmonary disease.

## 2022-05-15 MED ORDER — FILGRASTIM-SNDZ 480 MCG/0.8ML IJ SOSY
480.0000 ug | PREFILLED_SYRINGE | Freq: Once | INTRAMUSCULAR | Status: AC
  Administered 2022-05-15: 480 ug via SUBCUTANEOUS
  Filled 2022-05-15: qty 0.8

## 2022-05-15 NOTE — Patient Instructions (Signed)
Filgrastim Injection What is this medication? FILGRASTIM (fil GRA stim) lowers the risk of infection in people who are receiving chemotherapy. It works by helping your body make more white blood cells, which protects your body from infection. It may also be used to help people who have been exposed to high doses of radiation. It can be used to help prepare your body before a stem cell transplant. It works by helping your bone marrow make and release stem cells into the blood. This medicine may be used for other purposes; ask your health care provider or pharmacist if you have questions. COMMON BRAND NAME(S): Neupogen, Nivestym, Releuko, Zarxio What should I tell my care team before I take this medication? They need to know if you have any of these conditions: History of blood diseases, such as sickle cell anemia Kidney disease Recent or ongoing radiation An unusual or allergic reaction to filgrastim, pegfilgrastim, latex, rubber, other medications, foods, dyes, or preservatives Pregnant or trying to get pregnant Breast-feeding How should I use this medication? This medication is injected under the skin or into a vein. It is usually given by your care team in a hospital or clinic setting. It may be given at home. If you get this medication at home, you will be taught how to prepare and give it. Use exactly as directed. Take it as directed on the prescription label at the same time every day. Keep taking it unless your care team tells you to stop. It is important that you put your used needles and syringes in a special sharps container. Do not put them in a trash can. If you do not have a sharps container, call your pharmacist or care team to get one. This medication comes with INSTRUCTIONS FOR USE. Ask your pharmacist for directions on how to use this medication. Read the information carefully. Talk to your pharmacist or care team if you have questions. Talk to your care team about the use of this  medication in children. While it may be prescribed for children for selected conditions, precautions do apply. Overdosage: If you think you have taken too much of this medicine contact a poison control center or emergency room at once. NOTE: This medicine is only for you. Do not share this medicine with others. What if I miss a dose? It is important not to miss any doses. Talk to your care team about what to do if you miss a dose. What may interact with this medication? Medications that may cause a release of neutrophils, such as lithium This list may not describe all possible interactions. Give your health care provider a list of all the medicines, herbs, non-prescription drugs, or dietary supplements you use. Also tell them if you smoke, drink alcohol, or use illegal drugs. Some items may interact with your medicine. What should I watch for while using this medication? Your condition will be monitored carefully while you are receiving this medication. You may need bloodwork while taking this medication. Talk to your care team about your risk of cancer. You may be more at risk for certain types of cancer if you take this medication. What side effects may I notice from receiving this medication? Side effects that you should report to your care team as soon as possible: Allergic reactions--skin rash, itching, hives, swelling of the face, lips, tongue, or throat Capillary leak syndrome--stomach or muscle pain, unusual weakness or fatigue, feeling faint or lightheaded, decrease in the amount of urine, swelling of the ankles, hands, or   feet, trouble breathing High white blood cell level--fever, fatigue, trouble breathing, night sweats, change in vision, weight loss Inflammation of the aorta--fever, fatigue, back, chest, or stomach pain, severe headache Kidney injury (glomerulonephritis)--decrease in the amount of urine, red or dark brown urine, foamy or bubbly urine, swelling of the ankles, hands, or  feet Shortness of breath or trouble breathing Spleen injury--pain in upper left stomach or shoulder Unusual bruising or bleeding Side effects that usually do not require medical attention (report to your care team if they continue or are bothersome): Back pain Bone pain Fatigue Fever Headache Nausea This list may not describe all possible side effects. Call your doctor for medical advice about side effects. You may report side effects to FDA at 1-800-FDA-1088. Where should I keep my medication? Keep out of the reach of children and pets. Keep this medication in the original packaging until you are ready to take it. Protect from light. See product for storage information. Each product may have different instructions. Get rid of any unused medication after the expiration date. To get rid of medications that are no longer needed or have expired: Take the medication to a medications take-back program. Check with your pharmacy or law enforcement to find a location. If you cannot return the medication, ask your pharmacist or care team how to get rid of this medication safely. NOTE: This sheet is a summary. It may not cover all possible information. If you have questions about this medicine, talk to your doctor, pharmacist, or health care provider.  2023 Elsevier/Gold Standard (2021-08-26 00:00:00)  

## 2022-05-18 ENCOUNTER — Other Ambulatory Visit: Payer: 59

## 2022-05-18 ENCOUNTER — Ambulatory Visit: Payer: 59 | Admitting: Oncology

## 2022-05-18 ENCOUNTER — Other Ambulatory Visit: Payer: Self-pay

## 2022-05-18 DIAGNOSIS — C786 Secondary malignant neoplasm of retroperitoneum and peritoneum: Secondary | ICD-10-CM | POA: Diagnosis not present

## 2022-05-18 DIAGNOSIS — Z5111 Encounter for antineoplastic chemotherapy: Secondary | ICD-10-CM | POA: Diagnosis not present

## 2022-05-18 DIAGNOSIS — C7802 Secondary malignant neoplasm of left lung: Secondary | ICD-10-CM | POA: Diagnosis not present

## 2022-05-18 DIAGNOSIS — C7971 Secondary malignant neoplasm of right adrenal gland: Secondary | ICD-10-CM | POA: Diagnosis not present

## 2022-05-18 DIAGNOSIS — C7801 Secondary malignant neoplasm of right lung: Secondary | ICD-10-CM | POA: Diagnosis not present

## 2022-05-18 DIAGNOSIS — C7931 Secondary malignant neoplasm of brain: Secondary | ICD-10-CM | POA: Diagnosis not present

## 2022-05-18 DIAGNOSIS — Z5189 Encounter for other specified aftercare: Secondary | ICD-10-CM | POA: Diagnosis not present

## 2022-05-18 DIAGNOSIS — C189 Malignant neoplasm of colon, unspecified: Secondary | ICD-10-CM | POA: Diagnosis not present

## 2022-05-18 LAB — TOTAL PROTEIN, URINE DIPSTICK: Protein, ur: NEGATIVE mg/dL

## 2022-05-20 ENCOUNTER — Ambulatory Visit: Payer: 59

## 2022-05-22 ENCOUNTER — Inpatient Hospital Stay (HOSPITAL_BASED_OUTPATIENT_CLINIC_OR_DEPARTMENT_OTHER): Payer: 59 | Admitting: Oncology

## 2022-05-22 ENCOUNTER — Inpatient Hospital Stay: Payer: 59

## 2022-05-22 ENCOUNTER — Encounter: Payer: Self-pay | Admitting: Oncology

## 2022-05-22 ENCOUNTER — Telehealth: Payer: Self-pay | Admitting: Oncology

## 2022-05-22 DIAGNOSIS — C182 Malignant neoplasm of ascending colon: Secondary | ICD-10-CM

## 2022-05-22 DIAGNOSIS — C7801 Secondary malignant neoplasm of right lung: Secondary | ICD-10-CM | POA: Diagnosis not present

## 2022-05-22 DIAGNOSIS — C787 Secondary malignant neoplasm of liver and intrahepatic bile duct: Secondary | ICD-10-CM

## 2022-05-22 DIAGNOSIS — C7971 Secondary malignant neoplasm of right adrenal gland: Secondary | ICD-10-CM | POA: Diagnosis not present

## 2022-05-22 DIAGNOSIS — C786 Secondary malignant neoplasm of retroperitoneum and peritoneum: Secondary | ICD-10-CM | POA: Diagnosis not present

## 2022-05-22 DIAGNOSIS — Z5111 Encounter for antineoplastic chemotherapy: Secondary | ICD-10-CM | POA: Diagnosis not present

## 2022-05-22 DIAGNOSIS — Z5189 Encounter for other specified aftercare: Secondary | ICD-10-CM | POA: Diagnosis not present

## 2022-05-22 DIAGNOSIS — C7802 Secondary malignant neoplasm of left lung: Secondary | ICD-10-CM | POA: Diagnosis not present

## 2022-05-22 DIAGNOSIS — C7931 Secondary malignant neoplasm of brain: Secondary | ICD-10-CM | POA: Diagnosis not present

## 2022-05-22 DIAGNOSIS — C7800 Secondary malignant neoplasm of unspecified lung: Secondary | ICD-10-CM

## 2022-05-22 DIAGNOSIS — C189 Malignant neoplasm of colon, unspecified: Secondary | ICD-10-CM | POA: Diagnosis not present

## 2022-05-22 LAB — CMP (CANCER CENTER ONLY)
ALT: 23 U/L (ref 0–44)
AST: 25 U/L (ref 15–41)
Albumin: 4.4 g/dL (ref 3.5–5.0)
Alkaline Phosphatase: 108 U/L (ref 38–126)
Anion gap: 10 (ref 5–15)
BUN: 17 mg/dL (ref 6–20)
CO2: 21 mmol/L — ABNORMAL LOW (ref 22–32)
Calcium: 9.4 mg/dL (ref 8.9–10.3)
Chloride: 108 mmol/L (ref 98–111)
Creatinine: 1.75 mg/dL — ABNORMAL HIGH (ref 0.44–1.00)
GFR, Estimated: 34 mL/min — ABNORMAL LOW (ref >60)
Glucose, Bld: 202 mg/dL — ABNORMAL HIGH (ref 70–99)
Potassium: 4.1 mmol/L (ref 3.5–5.1)
Sodium: 139 mmol/L (ref 135–145)
Total Bilirubin: 0.4 mg/dL (ref 0.3–1.2)
Total Protein: 7 g/dL (ref 6.5–8.1)

## 2022-05-22 LAB — CBC: RBC: 4.06 (ref 3.87–5.11)

## 2022-05-22 LAB — CBC AND DIFFERENTIAL
HCT: 39 (ref 36–46)
Hemoglobin: 12.9 (ref 12.0–16.0)
Neutrophils Absolute: 1.32
Platelets: 110 10*3/uL — AB (ref 150–400)
WBC: 2.7

## 2022-05-22 LAB — CBC W DIFFERENTIAL (~~LOC~~ CC SCANNED REPORT)

## 2022-05-22 NOTE — Progress Notes (Signed)
[facility]  [address] [place],  [place]  [id number] [phone]  Clinic Day:  05/22/2022  Referring physician: Greig Right, MD  HISTORY OF PRESENT ILLNESS:  The patient is a 56 y.o. female with  metastatic colon cancer, which includes spread of disease to her abdominal cavity, lungs, brain and right adrenal gland. She comes in today to be evaluated before heading into her 39th cycle of palliative FOLFIRI/Avastin chemotherapy.  Of note, leucovorin has been discontinued due to an allergic reaction from this agent that did not improve despite being predmedicated with diphenhydramine and famotidine. The patient claims to have tolerated her 38th cycle of treatment okay.  She continues to report intermittent diarrhea, but uses Imodium as needed.  She denies having any new symptoms which concern her for possible disease progression.   With respect to her colon cancer history, she is status post a right hemicolectomy in early October 2018, followed by 12 cycles of adjuvant FOLFOX chemotherapy, which were completed in April 2019.  In December 2021, she underwent a left cerebellar metastasectomy, whose pathology was consistent with metastatic colon cancer.  Tumor testing did come back MMR normal.  CT scans revealed evidence of her cancer being in multiple locations, for which she has been taking FOLFIRI/Avastin ever since for disease control.  PHYSICAL EXAM:  Blood pressure 95/63, pulse (!) 106, temperature (!) 97.5 F (36.4 C), resp. rate 16, height _0  (1.727 m), weight 157 lb (71.2 kg), last menstrual period 02/22/2019, SpO2 93 %.  Body mass index is 23.87 kg/m.  Performance status (ECOG): 1 - Symptomatic but completely ambulatory  Physical Exam Vitals and nursing note reviewed.  Constitutional:      General: She is not in acute distress.    Appearance: Normal appearance.  HENT:     Head: Normocephalic and atraumatic.     Mouth/Throat:     Mouth:  Mucous membranes are moist.     Pharynx: Oropharynx is clear. No oropharyngeal exudate or posterior oropharyngeal erythema.  Eyes:     General: No scleral icterus.    Extraocular Movements: Extraocular movements intact.     Conjunctiva/sclera: Conjunctivae normal.     Pupils: Pupils are equal, round, and reactive to light.  Cardiovascular:     Rate and Rhythm: Normal rate and regular rhythm.     Heart sounds: Normal heart sounds. No murmur heard.    No friction rub. No gallop.  Pulmonary:     Effort: Pulmonary effort is normal.     Breath sounds: Normal breath sounds. No wheezing, rhonchi or rales.  Abdominal:     General: There is no distension.     Palpations: Abdomen is soft. There is no hepatomegaly, splenomegaly or mass.     Tenderness: There is no abdominal tenderness.  Musculoskeletal:        General: Normal range of motion.     Cervical back: Normal range of motion and neck supple. No tenderness.     Right lower leg: No edema.     Left lower leg: No edema.  Lymphadenopathy:     Cervical: No cervical adenopathy.     Upper Body:     Right upper body: No supraclavicular or axillary adenopathy.     Left upper body: No supraclavicular or axillary adenopathy.     Lower Body: No right inguinal adenopathy. No left inguinal adenopathy.  Skin:    General: Skin is warm and dry.     Coloration: Skin is not  jaundiced.     Findings: No rash.  Neurological:     Mental Status: She is alert and oriented to person, place, and time.     Cranial Nerves: No cranial nerve deficit.  Psychiatric:        Mood and Affect: Mood normal.        Behavior: Behavior normal.        Thought Content: Thought content normal.    LABS:      Latest Ref Rng & Units 05/22/2022   12:00 AM 05/01/2022   11:15 AM 04/27/2022   12:00 AM  CBC  WBC  2.7     7.6  5.0      Hemoglobin 12.0 - 16.0 12.9     13.1  13.2      Hematocrit 36 - 46 39     42.3  40      Platelets 150 - 400 K/uL 110     130  100          This result is from an external source.      Latest Ref Rng & Units 05/22/2022    2:40 PM 05/01/2022   11:15 AM 04/27/2022   12:00 AM  CMP  Glucose 70 - 99 mg/dL 202  112    BUN 6 - 20 mg/dL _0 Creatinine 0.44 - 1.00 mg/dL 1.75  1.43  1.2      Sodium 135 - 145 mmol/L 139  141  139      Potassium 3.5 - 5.1 mmol/L 4.1  4.0  4.2      Chloride 98 - 111 mmol/L 108  103  105      CO2 22 - 32 mmol/L _1 Calcium 8.9 - 10.3 mg/dL 9.4  9.1  9.3      Total Protein 6.5 - 8.1 g/dL 7.0  7.0    Total Bilirubin 0.3 - 1.2 mg/dL 0.4  0.5    Alkaline Phos 38 - 126 U/L 108  104  121      AST 15 - 41 U/L _2 ALT 0 - 44 U/L _3 This result is from an external source.   ASSESSMENT & PLAN:  Assessment/Plan:  A 56 y.o. female with metastatic colon cancer.  She will proceed with her 39th cycle of FOLFIRI/Avastin (without leucovorin) next week, which she will continue to receive every 3 weeks.  Clinically, she is doing well.  I will see her back in 3 weeks before she heads into her 40th cycle of FOLFIRI/Avastin. The patient understands all the plans discussed today and is in agreement with them.    Yafet Cline Macarthur Critchley, MD

## 2022-05-22 NOTE — Telephone Encounter (Signed)
Patient has been scheduled for follow-up visit per 05/22/22 los. Pt given an appt calendar with date and time.

## 2022-05-25 ENCOUNTER — Other Ambulatory Visit: Payer: Self-pay | Admitting: Pharmacist

## 2022-05-25 ENCOUNTER — Encounter: Payer: Self-pay | Admitting: Oncology

## 2022-05-25 NOTE — Progress Notes (Signed)
Ok to proceed with chemo despite ANC=1320 and scr=1.75 per Dr. Bobby Rumpf.  Patient will receive additional IVF with this cycle due to worsening renal function.

## 2022-05-26 ENCOUNTER — Ambulatory Visit: Payer: 59

## 2022-05-26 ENCOUNTER — Inpatient Hospital Stay: Payer: 59

## 2022-05-26 VITALS — BP 108/80 | HR 99 | Temp 97.5°F | Resp 16 | Wt 157.1 lb

## 2022-05-26 DIAGNOSIS — C7800 Secondary malignant neoplasm of unspecified lung: Secondary | ICD-10-CM

## 2022-05-26 DIAGNOSIS — C7801 Secondary malignant neoplasm of right lung: Secondary | ICD-10-CM | POA: Diagnosis not present

## 2022-05-26 DIAGNOSIS — C7931 Secondary malignant neoplasm of brain: Secondary | ICD-10-CM | POA: Diagnosis not present

## 2022-05-26 DIAGNOSIS — C7971 Secondary malignant neoplasm of right adrenal gland: Secondary | ICD-10-CM | POA: Diagnosis not present

## 2022-05-26 DIAGNOSIS — Z5189 Encounter for other specified aftercare: Secondary | ICD-10-CM | POA: Diagnosis not present

## 2022-05-26 DIAGNOSIS — C182 Malignant neoplasm of ascending colon: Secondary | ICD-10-CM

## 2022-05-26 DIAGNOSIS — C787 Secondary malignant neoplasm of liver and intrahepatic bile duct: Secondary | ICD-10-CM

## 2022-05-26 DIAGNOSIS — C189 Malignant neoplasm of colon, unspecified: Secondary | ICD-10-CM | POA: Diagnosis not present

## 2022-05-26 DIAGNOSIS — C786 Secondary malignant neoplasm of retroperitoneum and peritoneum: Secondary | ICD-10-CM | POA: Diagnosis not present

## 2022-05-26 DIAGNOSIS — C7802 Secondary malignant neoplasm of left lung: Secondary | ICD-10-CM | POA: Diagnosis not present

## 2022-05-26 DIAGNOSIS — Z5111 Encounter for antineoplastic chemotherapy: Secondary | ICD-10-CM | POA: Diagnosis not present

## 2022-05-26 MED ORDER — SODIUM CHLORIDE 0.9 % IV SOLN: Freq: Once | INTRAVENOUS | Status: AC

## 2022-05-26 MED ORDER — SODIUM CHLORIDE 0.9 % IV SOLN
10.0000 mg | Freq: Once | INTRAVENOUS | Status: AC
  Administered 2022-05-26: 10 mg via INTRAVENOUS
  Filled 2022-05-26: qty 10

## 2022-05-26 MED ORDER — HEPARIN SOD (PORK) LOCK FLUSH 100 UNIT/ML IV SOLN: 500.0000 [IU] | Freq: Once | INTRAVENOUS | Status: DC | PRN

## 2022-05-26 MED ORDER — FAMOTIDINE IN NACL 20-0.9 MG/50ML-% IV SOLN
20.0000 mg | Freq: Once | INTRAVENOUS | Status: AC
  Administered 2022-05-26: 20 mg via INTRAVENOUS
  Filled 2022-05-26: qty 50

## 2022-05-26 MED ORDER — SODIUM CHLORIDE 0.9 % IV SOLN
5.0000 mg/kg | Freq: Once | INTRAVENOUS | Status: AC
  Administered 2022-05-26: 350 mg via INTRAVENOUS
  Filled 2022-05-26: qty 14

## 2022-05-26 MED ORDER — PALONOSETRON HCL INJECTION 0.25 MG/5ML
0.2500 mg | Freq: Once | INTRAVENOUS | Status: AC
  Administered 2022-05-26: 0.25 mg via INTRAVENOUS
  Filled 2022-05-26: qty 5

## 2022-05-26 MED ORDER — DIPHENHYDRAMINE HCL 50 MG/ML IJ SOLN
25.0000 mg | Freq: Once | INTRAMUSCULAR | Status: AC
  Administered 2022-05-26: 25 mg via INTRAVENOUS
  Filled 2022-05-26: qty 1

## 2022-05-26 MED ORDER — SODIUM CHLORIDE 0.9 % IV SOLN
150.0000 mg | Freq: Once | INTRAVENOUS | Status: AC
  Administered 2022-05-26: 150 mg via INTRAVENOUS
  Filled 2022-05-26: qty 150

## 2022-05-26 MED ORDER — FLUOROURACIL CHEMO INJECTION 2.5 GM/50ML
400.0000 mg/m2 | Freq: Once | INTRAVENOUS | Status: AC
  Administered 2022-05-26: 800 mg via INTRAVENOUS
  Filled 2022-05-26: qty 16

## 2022-05-26 MED ORDER — SODIUM CHLORIDE 0.9% FLUSH: 10.0000 mL | INTRAVENOUS | Status: DC | PRN

## 2022-05-26 MED ORDER — ATROPINE SULFATE 1 MG/ML IV SOLN
0.5000 mg | Freq: Once | INTRAVENOUS | Status: AC | PRN
  Administered 2022-05-26: 0.5 mg via INTRAVENOUS
  Filled 2022-05-26: qty 1

## 2022-05-26 MED ORDER — SODIUM CHLORIDE 0.9 % IV SOLN
2400.0000 mg/m2 | INTRAVENOUS | Status: DC
  Administered 2022-05-26: 4450 mg via INTRAVENOUS
  Filled 2022-05-26: qty 89

## 2022-05-26 MED ORDER — SODIUM CHLORIDE 0.9 % IV SOLN: Freq: Once | INTRAVENOUS | Status: DC

## 2022-05-26 MED ORDER — IRINOTECAN HCL CHEMO INJECTION 100 MG/5ML
144.0000 mg/m2 | Freq: Once | INTRAVENOUS | Status: AC
  Administered 2022-05-26: 280 mg via INTRAVENOUS
  Filled 2022-05-26: qty 10

## 2022-05-26 NOTE — Patient Instructions (Signed)
The chemotherapy medication bag should finish at 46 hours. For example, if your pump is scheduled for 46 hours and it was put on at 4:00 p.m., it should finish at 2:00 p.m. the day it is scheduled to come off regardless of your appointment time.     Estimated time to finish at 2pm.   If the display on your pump reads "Low Volume" and it is beeping, take the batteries out of the pump and come to the cancer center for it to be taken off.   If the pump alarms go off prior to the pump reading "Low Volume" then call [phone] and someone can assist you.  If the plunger comes out and the chemotherapy medication is leaking out, please use your home chemo spill kit to clean up the spill. Do NOT use paper towels or other household products.  If you have problems or questions regarding your pump, please call either [phone] (24 hours a day) or the cancer center Monday-Friday 8:00 a.m.- 4:30 p.m. at the clinic number and we will assist you. If you are unable to get assistance, then go to the nearest Emergency Department and ask the staff to contact the IV team for assistance.   Fluorouracil Injection What is this medication? FLUOROURACIL (flure oh YOOR a sil) treats some types of cancer. It works by slowing down the growth of cancer cells. This medicine may be used for other purposes; ask your health care provider or pharmacist if you have questions. COMMON BRAND NAME(S): Adrucil What should I tell my care team before I take this medication? They need to know if you have any of these conditions: Blood disorders Dihydropyrimidine dehydrogenase (DPD) deficiency Infection, such as chickenpox, cold sores, herpes Kidney disease Liver disease Poor nutrition Recent or ongoing radiation therapy An unusual or allergic reaction to fluorouracil, other medications, foods, dyes, or preservatives If you or your partner are pregnant or trying to get pregnant Breast-feeding How should I use this  medication? This medication is injected into a vein. It is administered by your care team in a hospital or clinic setting. Talk to your care team about the use of this medication in children. Special care may be needed. Overdosage: If you think you have taken too much of this medicine contact a poison control center or emergency room at once. NOTE: This medicine is only for you. Do not share this medicine with others. What if I miss a dose? Keep appointments for follow-up doses. It is important not to miss your dose. Call your care team if you are unable to keep an appointment. What may interact with this medication? Do not take this medication with any of the following: Live virus vaccines This medication may also interact with the following: Medications that treat or prevent blood clots, such as warfarin, enoxaparin, dalteparin This list may not describe all possible interactions. Give your health care provider a list of all the medicines, herbs, non-prescription drugs, or dietary supplements you use. Also tell them if you smoke, drink alcohol, or use illegal drugs. Some items may interact with your medicine. What should I watch for while using this medication? Your condition will be monitored carefully while you are receiving this medication. This medication may make you feel generally unwell. This is not uncommon as chemotherapy can affect healthy cells as well as cancer cells. Report any side effects. Continue your course of treatment even though you feel ill unless your care team tells you to stop. In some cases, you  may be given additional medications to help with side effects. Follow all directions for their use. This medication may increase your risk of getting an infection. Call your care team for advice if you get a fever, chills, sore throat, or other symptoms of a cold or flu. Do not treat yourself. Try to avoid being around people who are sick. This medication may increase your risk  to bruise or bleed. Call your care team if you notice any unusual bleeding. Be careful brushing or flossing your teeth or using a toothpick because you may get an infection or bleed more easily. If you have any dental work done, tell your dentist you are receiving this medication. Avoid taking medications that contain aspirin, acetaminophen, ibuprofen, naproxen, or ketoprofen unless instructed by your care team. These medications may hide a fever. Do not treat diarrhea with over the counter products. Contact your care team if you have diarrhea that lasts more than 2 days or if it is severe and watery. This medication can make you more sensitive to the sun. Keep out of the sun. If you cannot avoid being in the sun, wear protective clothing and sunscreen. Do not use sun lamps, tanning beds, or tanning booths. Talk to your care team if you or your partner wish to become pregnant or think you might be pregnant. This medication can cause serious birth defects if taken during pregnancy and for 3 months after the last dose. A reliable form of contraception is recommended while taking this medication and for 3 months after the last dose. Talk to your care team about effective forms of contraception. Do not father a child while taking this medication and for 3 months after the last dose. Use a condom while having sex during this time period. Do not breastfeed while taking this medication. This medication may cause infertility. Talk to your care team if you are concerned about your fertility. What side effects may I notice from receiving this medication? Side effects that you should report to your care team as soon as possible: Allergic reactions--skin rash, itching, hives, swelling of the face, lips, tongue, or throat Heart attack--pain or tightness in the chest, shoulders, arms, or jaw, nausea, shortness of breath, cold or clammy skin, feeling faint or lightheaded Heart failure--shortness of breath, swelling of  the ankles, feet, or hands, sudden weight gain, unusual weakness or fatigue Heart rhythm changes--fast or irregular heartbeat, dizziness, feeling faint or lightheaded, chest pain, trouble breathing High ammonia level--unusual weakness or fatigue, confusion, loss of appetite, nausea, vomiting, seizures Infection--fever, chills, cough, sore throat, wounds that don't heal, pain or trouble when passing urine, general feeling of discomfort or being unwell Low red blood cell level--unusual weakness or fatigue, dizziness, headache, trouble breathing Pain, tingling, or numbness in the hands or feet, muscle weakness, change in vision, confusion or trouble speaking, loss of balance or coordination, trouble walking, seizures Redness, swelling, and blistering of the skin over hands and feet Severe or prolonged diarrhea Unusual bruising or bleeding Side effects that usually do not require medical attention (report to your care team if they continue or are bothersome): Dry skin Headache Increased tears Nausea Pain, redness, or swelling with sores inside the mouth or throat Sensitivity to light Vomiting This list may not describe all possible side effects. Call your doctor for medical advice about side effects. You may report side effects to FDA at 1-800-FDA-1088. Where should I keep my medication? This medication is given in a hospital or clinic. It will not  be stored at home. NOTE: This sheet is a summary. It may not cover all possible information. If you have questions about this medicine, talk to your doctor, pharmacist, or health care provider.  2023 [employer] Standard (2021-09-16 00:00:00) Irinotecan Injection What is this medication? IRINOTECAN (ir in oh TEE kan) treats some types of cancer. It works by slowing down the growth of cancer cells. This medicine may be used for other purposes; ask your health care provider or pharmacist if you have questions. COMMON BRAND NAME(S): Camptosar What  should I tell my care team before I take this medication? They need to know if you have any of these conditions: Dehydration Diarrhea Infection, especially a viral infection, such as chickenpox, cold sores, herpes Liver disease Low blood cell levels (white cells, red cells, and platelets) Low levels of electrolytes, such as calcium, magnesium, or potassium in your blood Recent or ongoing radiation An unusual or allergic reaction to irinotecan, other medications, foods, dyes, or preservatives If you or your partner are pregnant or trying to get pregnant Breast-feeding How should I use this medication? This medication is injected into a vein. It is given by your care team in a hospital or clinic setting. Talk to your care team about the use of this medication in children. Special care may be needed. Overdosage: If you think you have taken too much of this medicine contact a poison control center or emergency room at once. NOTE: This medicine is only for you. Do not share this medicine with others. What if I miss a dose? Keep appointments for follow-up doses. It is important not to miss your dose. Call your care team if you are unable to keep an appointment. What may interact with this medication? Do not take this medication with any of the following: Cobicistat Itraconazole This medication may also interact with the following: Certain antibiotics, such as clarithromycin, rifampin, rifabutin Certain antivirals for HIV or AIDS Certain medications for fungal infections, such as ketoconazole, posaconazole, voriconazole Certain medications for seizures, such as carbamazepine, phenobarbital, phenytoin Gemfibrozil Nefazodone St. John's wort This list may not describe all possible interactions. Give your health care provider a list of all the medicines, herbs, non-prescription drugs, or dietary supplements you use. Also tell them if you smoke, drink alcohol, or use illegal drugs. Some items may  interact with your medicine. What should I watch for while using this medication? Your condition will be monitored carefully while you are receiving this medication. You may need blood work while taking this medication. This medication may make you feel generally unwell. This is not uncommon as chemotherapy can affect healthy cells as well as cancer cells. Report any side effects. Continue your course of treatment even though you feel ill unless your care team tells you to stop. This medication can cause serious side effects. To reduce the risk, your care team may give you other medications to take before receiving this one. Be sure to follow the directions from your care team. This medication may affect your coordination, reaction time, or judgement. Do not drive or operate machinery until you know how this medication affects you. Sit up or stand slowly to reduce the risk of dizzy or fainting spells. Drinking alcohol with this medication can increase the risk of these side effects. This medication may increase your risk of getting an infection. Call your care team for advice if you get a fever, chills, sore throat, or other symptoms of a cold or flu. Do not treat yourself. Try  to avoid being around people who are sick. Avoid taking medications that contain aspirin, acetaminophen, ibuprofen, naproxen, or ketoprofen unless instructed by your care team. These medications may hide a fever. This medication may increase your risk to bruise or bleed. Call your care team if you notice any unusual bleeding. Be careful brushing or flossing your teeth or using a toothpick because you may get an infection or bleed more easily. If you have any dental work done, tell your dentist you are receiving this medication. Talk to your care team if you or your partner are pregnant or think either of you might be pregnant. This medication can cause serious birth defects if taken during pregnancy and for 6 months after the last  dose. You will need a negative pregnancy test before starting this medication. Contraception is recommended while taking this medication and for 6 months after the last dose. Your care team can help you find the option that works for you. Do not father a child while taking this medication and for 3 months after the last dose. Use a condom for contraception during this time period. Do not breastfeed while taking this medication and for 7 days after the last dose. This medication may cause infertility. Talk to your care team if you are concerned about your fertility. What side effects may I notice from receiving this medication? Side effects that you should report to your care team as soon as possible: Allergic reactions--skin rash, itching, hives, swelling of the face, lips, tongue, or throat Dry cough, shortness of breath or trouble breathing Increased saliva or tears, increased sweating, stomach cramping, diarrhea, small pupils, unusual weakness or fatigue, slow heartbeat Infection--fever, chills, cough, sore throat, wounds that don't heal, pain or trouble when passing urine, general feeling of discomfort or being unwell Kidney injury--decrease in the amount of urine, swelling of the ankles, hands, or feet Low red blood cell level--unusual weakness or fatigue, dizziness, headache, trouble breathing Severe or prolonged diarrhea Unusual bruising or bleeding Side effects that usually do not require medical attention (report to your care team if they continue or are bothersome): Constipation Diarrhea Hair loss Loss of appetite Nausea Stomach pain This list may not describe all possible side effects. Call your doctor for medical advice about side effects. You may report side effects to FDA at 1-800-FDA-1088. Where should I keep my medication? This medication is given in a hospital or clinic. It will not be stored at home. NOTE: This sheet is a summary. It may not cover all possible information.  If you have questions about this medicine, talk to your doctor, pharmacist, or health care provider.  2023 [employer]/Gold Standard (2021-09-25 00:00:00) Bevacizumab Injection What is this medication? BEVACIZUMAB (be va SIZ yoo mab) treats some types of cancer. It works by blocking a protein that causes cancer cells to grow and multiply. This helps to slow or stop the spread of cancer cells. It is a monoclonal antibody. This medicine may be used for other purposes; ask your health care provider or pharmacist if you have questions. COMMON BRAND NAME(S): Alymsys, Avastin, MVASI, Noah Charon What should I tell my care team before I take this medication? They need to know if you have any of these conditions: Blood clots Coughing up blood Having or recent surgery Heart failure High blood pressure History of a connection between 2 or more body parts that do not usually connect (fistula) History of a tear in your stomach or intestines Protein in your urine An unusual or allergic  reaction to bevacizumab, other medications, foods, dyes, or preservatives Pregnant or trying to get pregnant Breast-feeding How should I use this medication? This medication is injected into a vein. It is given by your care team in a hospital or clinic setting. Talk to your care team the use of this medication in children. Special care may be needed. Overdosage: If you think you have taken too much of this medicine contact a poison control center or emergency room at once. NOTE: This medicine is only for you. Do not share this medicine with others. What if I miss a dose? Keep appointments for follow-up doses. It is important not to miss your dose. Call your care team if you are unable to keep an appointment. What may interact with this medication? Interactions are not expected. This list may not describe all possible interactions. Give your health care provider a list of all the medicines, herbs, non-prescription drugs, or  dietary supplements you use. Also tell them if you smoke, drink alcohol, or use illegal drugs. Some items may interact with your medicine. What should I watch for while using this medication? Your condition will be monitored carefully while you are receiving this medication. You may need blood work while taking this medication. This medication may make you feel generally unwell. This is not uncommon as chemotherapy can affect healthy cells as well as cancer cells. Report any side effects. Continue your course of treatment even though you feel ill unless your care team tells you to stop. This medication may increase your risk to bruise or bleed. Call your care team if you notice any unusual bleeding. Before having surgery, talk to your care team to make sure it is ok. This medication can increase the risk of poor healing of your surgical site or wound. You will need to stop this medication for 28 days before surgery. After surgery, wait at least 28 days before restarting this medication. Make sure the surgical site or wound is healed enough before restarting this medication. Talk to your care team if questions. Talk to your care team if you may be pregnant. Serious birth defects can occur if you take this medication during pregnancy and for 6 months after the last dose. Contraception is recommended while taking this medication and for 6 months after the last dose. Your care team can help you find the option that works for you. Do not breastfeed while taking this medication and for 6 months after the last dose. This medication can cause infertility. Talk to your care team if you are concerned about your fertility. What side effects may I notice from receiving this medication? Side effects that you should report to your care team as soon as possible: Allergic reactions--skin rash, itching, hives, swelling of the face, lips, tongue, or throat Bleeding--bloody or black, tar-like stools, vomiting blood or  brown material that looks like coffee grounds, red or dark brown urine, small red or purple spots on skin, unusual bruising or bleeding Blood clot--pain, swelling, or warmth in the leg, shortness of breath, chest pain Heart attack--pain or tightness in the chest, shoulders, arms, or jaw, nausea, shortness of breath, cold or clammy skin, feeling faint or lightheaded Heart failure--shortness of breath, swelling of the ankles, feet, or hands, sudden weight gain, unusual weakness or fatigue Increase in blood pressure Infection--fever, chills, cough, sore throat, wounds that don't heal, pain or trouble when passing urine, general feeling of discomfort or being unwell Infusion reactions--chest pain, shortness of breath or trouble breathing, feeling  faint or lightheaded Kidney injury--decrease in the amount of urine, swelling of the ankles, hands, or feet Stomach pain that is severe, does not go away, or gets worse Stroke--sudden numbness or weakness of the face, arm, or leg, trouble speaking, confusion, trouble walking, loss of balance or coordination, dizziness, severe headache, change in vision Sudden and severe headache, confusion, change in vision, seizures, which may be signs of posterior reversible encephalopathy syndrome (PRES) Side effects that usually do not require medical attention (report to your care team if they continue or are bothersome): Back pain Change in taste Diarrhea Dry skin Increased tears Nosebleed This list may not describe all possible side effects. Call your doctor for medical advice about side effects. You may report side effects to FDA at 1-800-FDA-1088. Where should I keep my medication? This medication is given in a hospital or clinic. It will not be stored at home. NOTE: This sheet is a summary. It may not cover all possible information. If you have questions about this medicine, talk to your doctor, pharmacist, or health care provider.  2023 [employer]/Gold Standard  (2021-09-19 00:00:00)

## 2022-05-27 ENCOUNTER — Ambulatory Visit: Payer: 59

## 2022-05-27 ENCOUNTER — Other Ambulatory Visit: Payer: Self-pay | Admitting: Psychiatry

## 2022-05-27 DIAGNOSIS — F422 Mixed obsessional thoughts and acts: Secondary | ICD-10-CM

## 2022-05-28 ENCOUNTER — Inpatient Hospital Stay: Payer: 59

## 2022-05-28 ENCOUNTER — Ambulatory Visit: Payer: 59

## 2022-05-28 VITALS — BP 93/68 | HR 98 | Temp 97.9°F | Resp 18 | Ht 68.0 in | Wt 160.0 lb

## 2022-05-28 DIAGNOSIS — Z5111 Encounter for antineoplastic chemotherapy: Secondary | ICD-10-CM | POA: Diagnosis not present

## 2022-05-28 DIAGNOSIS — C7971 Secondary malignant neoplasm of right adrenal gland: Secondary | ICD-10-CM | POA: Diagnosis not present

## 2022-05-28 DIAGNOSIS — C7931 Secondary malignant neoplasm of brain: Secondary | ICD-10-CM | POA: Diagnosis not present

## 2022-05-28 DIAGNOSIS — C7801 Secondary malignant neoplasm of right lung: Secondary | ICD-10-CM | POA: Diagnosis not present

## 2022-05-28 DIAGNOSIS — Z5189 Encounter for other specified aftercare: Secondary | ICD-10-CM | POA: Diagnosis not present

## 2022-05-28 DIAGNOSIS — C189 Malignant neoplasm of colon, unspecified: Secondary | ICD-10-CM

## 2022-05-28 DIAGNOSIS — C182 Malignant neoplasm of ascending colon: Secondary | ICD-10-CM

## 2022-05-28 DIAGNOSIS — C7802 Secondary malignant neoplasm of left lung: Secondary | ICD-10-CM | POA: Diagnosis not present

## 2022-05-28 DIAGNOSIS — C786 Secondary malignant neoplasm of retroperitoneum and peritoneum: Secondary | ICD-10-CM | POA: Diagnosis not present

## 2022-05-28 MED ORDER — HEPARIN SOD (PORK) LOCK FLUSH 100 UNIT/ML IV SOLN
500.0000 [IU] | Freq: Once | INTRAVENOUS | Status: AC | PRN
  Administered 2022-05-28: 500 [IU]

## 2022-05-28 MED ORDER — SODIUM CHLORIDE 0.9 % IV SOLN: Freq: Once | INTRAVENOUS | Status: AC

## 2022-05-28 MED ORDER — SODIUM CHLORIDE 0.9% FLUSH
10.0000 mL | INTRAVENOUS | Status: DC | PRN
  Administered 2022-05-28: 10 mL

## 2022-05-28 NOTE — Patient Instructions (Signed)
Dehydration, Adult Dehydration is condition in which there is not enough water or other fluids in the body. This happens when a person loses more fluids than he or she takes in. Important body parts cannot work right without the right amount of fluids. Any loss of fluids from the body can cause dehydration. Dehydration can be mild, worse, or very bad. It should be treated right away to keep it from getting very bad. What are the causes? This condition may be caused by: Conditions that cause loss of water or other fluids, such as: Watery poop (diarrhea). Vomiting. Sweating a lot. Peeing (urinating) a lot. Not drinking enough fluids, especially when you: Are ill. Are doing things that take a lot of energy to do. Other illnesses and conditions, such as fever or infection. Certain medicines, such as medicines that take extra fluid out of the body (diuretics). Lack of safe drinking water. Not being able to get enough water and food. What increases the risk? The following factors may make you more likely to develop this condition: Having a long-term (chronic) illness that has not been treated the right way, such as: Diabetes. Heart disease. Kidney disease. Being 65 years of age or older. Having a disability. Living in a place that is high above the ground or sea (high in altitude). The thinner, dried air causes more fluid loss. Doing exercises that put stress on your body for a long time. What are the signs or symptoms? Symptoms of dehydration depend on how bad it is. Mild or worse dehydration Thirst. Dry lips or dry mouth. Feeling dizzy or light-headed, especially when you stand up from sitting. Muscle cramps. Your body making: Dark pee (urine). Pee may be the color of tea. Less pee than normal. Less tears than normal. Headache. Very bad dehydration Changes in skin. Skin may: Be cold to the touch (clammy). Be blotchy or pale. Not go back to normal right after you lightly pinch  it and let it go. Little or no tears, pee, or sweat. Changes in vital signs, such as: Fast breathing. Low blood pressure. Weak pulse. Pulse that is more than 100 beats a minute when you are sitting still. Other changes, such as: Feeling very thirsty. Eyes that look hollow (sunken). Cold hands and feet. Being mixed up (confused). Being very tired (lethargic) or having trouble waking from sleep. Short-term weight loss. Loss of consciousness. How is this treated? Treatment for this condition depends on how bad it is. Treatment should start right away. Do not wait until your condition gets very bad. Very bad dehydration is an emergency. You will need to go to a hospital. Mild or worse dehydration can be treated at home. You may be asked to: Drink more fluids. Drink an oral rehydration solution (ORS). This drink helps get the right amounts of fluids and salts and minerals in the blood (electrolytes). Very bad dehydration can be treated: With fluids through an IV tube. By getting normal levels of salts and minerals in your blood. This is often done by giving salts and minerals through a tube. The tube is passed through your nose and into your stomach. By treating the root cause. Follow these instructions at home: Oral rehydration solution If told by your doctor, drink an ORS: Make an ORS. Use instructions on the package. Start by drinking small amounts, about  cup (120 mL) every 5-10 minutes. Slowly drink more until you have had the amount that your doctor said to have. Eating and drinking          Drink enough clear fluid to keep your pee pale yellow. If you were told to drink an ORS, finish the ORS first. Then, start slowly drinking other clear fluids. Drink fluids such as: Water. Do not drink only water. Doing that can make the salt (sodium) level in your body get too low. Water from ice chips you suck on. Fruit juice that you have added water to (diluted). Low-calorie sports  drinks. Eat foods that have the right amounts of salts and minerals, such as: Bananas. Oranges. Potatoes. Tomatoes. Spinach. Do not drink alcohol. Avoid: Drinks that have a lot of sugar. These include: High-calorie sports drinks. Fruit juice that you did not add water to. Soda. Caffeine. Foods that are greasy or have a lot of fat or sugar. General instructions Take over-the-counter and prescription medicines only as told by your doctor. Do not take salt tablets. Doing that can make the salt level in your body get too high. Return to your normal activities as told by your doctor. Ask your doctor what activities are safe for you. Keep all follow-up visits as told by your doctor. This is important. Contact a doctor if: You have pain in your belly (abdomen) and the pain: Gets worse. Stays in one place. You have a rash. You have a stiff neck. You get angry or annoyed (irritable) more easily than normal. You are more tired or have a harder time waking than normal. You feel: Weak or dizzy. Very thirsty. Get help right away if you have: Any symptoms of very bad dehydration. Symptoms of vomiting, such as: You cannot eat or drink without vomiting. Your vomiting gets worse or does not go away. Your vomit has blood or green stuff in it. Symptoms that get worse with treatment. A fever. A very bad headache. Problems with peeing or pooping (having a bowel movement), such as: Watery poop that gets worse or does not go away. Blood in your poop (stool). This may cause poop to look black and tarry. Not peeing in 6-8 hours. Peeing only a small amount of very dark pee in 6-8 hours. Trouble breathing. These symptoms may be an emergency. Do not wait to see if the symptoms will go away. Get medical help right away. Call your local emergency services (911 in the [place] Do not drive yourself to the hospital. Summary Dehydration is a condition in which there is not enough water or other fluids  in the body. This happens when a person loses more fluids than he or she takes in. Treatment for this condition depends on how bad it is. Treatment should be started right away. Do not wait until your condition gets very bad. Drink enough clear fluid to keep your pee pale yellow. If you were told to drink an oral rehydration solution (ORS), finish the ORS first. Then, start slowly drinking other clear fluids. Take over-the-counter and prescription medicines only as told by your doctor. Get help right away if you have any symptoms of very bad dehydration. This information is not intended to replace advice given to you by your health care provider. Make sure you discuss any questions you have with your health care provider. Document Revised: 09/24/2021 Document Reviewed: 12/29/2018 Elsevier Patient Education  2023 Elsevier Inc.  

## 2022-06-02 ENCOUNTER — Inpatient Hospital Stay: Payer: 59 | Attending: Oncology

## 2022-06-02 VITALS — BP 108/87 | HR 102 | Temp 98.0°F | Resp 18 | Ht 68.0 in | Wt 155.2 lb

## 2022-06-02 DIAGNOSIS — Z5189 Encounter for other specified aftercare: Secondary | ICD-10-CM | POA: Insufficient documentation

## 2022-06-02 DIAGNOSIS — C7801 Secondary malignant neoplasm of right lung: Secondary | ICD-10-CM | POA: Insufficient documentation

## 2022-06-02 DIAGNOSIS — C7931 Secondary malignant neoplasm of brain: Secondary | ICD-10-CM | POA: Insufficient documentation

## 2022-06-02 DIAGNOSIS — C182 Malignant neoplasm of ascending colon: Secondary | ICD-10-CM | POA: Diagnosis not present

## 2022-06-02 DIAGNOSIS — C7800 Secondary malignant neoplasm of unspecified lung: Secondary | ICD-10-CM

## 2022-06-02 DIAGNOSIS — C786 Secondary malignant neoplasm of retroperitoneum and peritoneum: Secondary | ICD-10-CM | POA: Insufficient documentation

## 2022-06-02 DIAGNOSIS — Z5111 Encounter for antineoplastic chemotherapy: Secondary | ICD-10-CM | POA: Diagnosis not present

## 2022-06-02 DIAGNOSIS — C7802 Secondary malignant neoplasm of left lung: Secondary | ICD-10-CM | POA: Diagnosis not present

## 2022-06-02 DIAGNOSIS — C787 Secondary malignant neoplasm of liver and intrahepatic bile duct: Secondary | ICD-10-CM | POA: Insufficient documentation

## 2022-06-02 DIAGNOSIS — C7971 Secondary malignant neoplasm of right adrenal gland: Secondary | ICD-10-CM | POA: Diagnosis not present

## 2022-06-02 DIAGNOSIS — C189 Malignant neoplasm of colon, unspecified: Secondary | ICD-10-CM

## 2022-06-02 MED ORDER — FILGRASTIM-SNDZ 480 MCG/0.8ML IJ SOSY
480.0000 ug | PREFILLED_SYRINGE | Freq: Once | INTRAMUSCULAR | Status: AC
  Administered 2022-06-02: 480 ug via SUBCUTANEOUS
  Filled 2022-06-02: qty 0.8

## 2022-06-02 NOTE — Patient Instructions (Signed)
Filgrastim Injection What is this medication? FILGRASTIM (fil GRA stim) lowers the risk of infection in people who are receiving chemotherapy. It works by helping your body make more white blood cells, which protects your body from infection. It may also be used to help people who have been exposed to high doses of radiation. It can be used to help prepare your body before a stem cell transplant. It works by helping your bone marrow make and release stem cells into the blood. This medicine may be used for other purposes; ask your health care provider or pharmacist if you have questions. COMMON BRAND NAME(S): Neupogen, Nivestym, Releuko, Zarxio What should I tell my care team before I take this medication? They need to know if you have any of these conditions: History of blood diseases, such as sickle cell anemia Kidney disease Recent or ongoing radiation An unusual or allergic reaction to filgrastim, pegfilgrastim, latex, rubber, other medications, foods, dyes, or preservatives Pregnant or trying to get pregnant Breast-feeding How should I use this medication? This medication is injected under the skin or into a vein. It is usually given by your care team in a hospital or clinic setting. It may be given at home. If you get this medication at home, you will be taught how to prepare and give it. Use exactly as directed. Take it as directed on the prescription label at the same time every day. Keep taking it unless your care team tells you to stop. It is important that you put your used needles and syringes in a special sharps container. Do not put them in a trash can. If you do not have a sharps container, call your pharmacist or care team to get one. This medication comes with INSTRUCTIONS FOR USE. Ask your pharmacist for directions on how to use this medication. Read the information carefully. Talk to your pharmacist or care team if you have questions. Talk to your care team about the use of this  medication in children. While it may be prescribed for children for selected conditions, precautions do apply. Overdosage: If you think you have taken too much of this medicine contact a poison control center or emergency room at once. NOTE: This medicine is only for you. Do not share this medicine with others. What if I miss a dose? It is important not to miss any doses. Talk to your care team about what to do if you miss a dose. What may interact with this medication? Medications that may cause a release of neutrophils, such as lithium This list may not describe all possible interactions. Give your health care provider a list of all the medicines, herbs, non-prescription drugs, or dietary supplements you use. Also tell them if you smoke, drink alcohol, or use illegal drugs. Some items may interact with your medicine. What should I watch for while using this medication? Your condition will be monitored carefully while you are receiving this medication. You may need bloodwork while taking this medication. Talk to your care team about your risk of cancer. You may be more at risk for certain types of cancer if you take this medication. What side effects may I notice from receiving this medication? Side effects that you should report to your care team as soon as possible: Allergic reactions--skin rash, itching, hives, swelling of the face, lips, tongue, or throat Capillary leak syndrome--stomach or muscle pain, unusual weakness or fatigue, feeling faint or lightheaded, decrease in the amount of urine, swelling of the ankles, hands, or   feet, trouble breathing High white blood cell level--fever, fatigue, trouble breathing, night sweats, change in vision, weight loss Inflammation of the aorta--fever, fatigue, back, chest, or stomach pain, severe headache Kidney injury (glomerulonephritis)--decrease in the amount of urine, red or dark brown urine, foamy or bubbly urine, swelling of the ankles, hands, or  feet Shortness of breath or trouble breathing Spleen injury--pain in upper left stomach or shoulder Unusual bruising or bleeding Side effects that usually do not require medical attention (report to your care team if they continue or are bothersome): Back pain Bone pain Fatigue Fever Headache Nausea This list may not describe all possible side effects. Call your doctor for medical advice about side effects. You may report side effects to FDA at 1-800-FDA-1088. Where should I keep my medication? Keep out of the reach of children and pets. Keep this medication in the original packaging until you are ready to take it. Protect from light. See product for storage information. Each product may have different instructions. Get rid of any unused medication after the expiration date. To get rid of medications that are no longer needed or have expired: Take the medication to a medications take-back program. Check with your pharmacy or law enforcement to find a location. If you cannot return the medication, ask your pharmacist or care team how to get rid of this medication safely. NOTE: This sheet is a summary. It may not cover all possible information. If you have questions about this medicine, talk to your doctor, pharmacist, or health care provider.  2023 Elsevier/Gold Standard (2021-08-26 00:00:00)  

## 2022-06-03 ENCOUNTER — Inpatient Hospital Stay: Payer: 59

## 2022-06-03 VITALS — BP 120/76 | HR 100 | Temp 98.0°F | Resp 18 | Ht 68.0 in | Wt 157.5 lb

## 2022-06-03 DIAGNOSIS — Z5189 Encounter for other specified aftercare: Secondary | ICD-10-CM | POA: Diagnosis not present

## 2022-06-03 DIAGNOSIS — C182 Malignant neoplasm of ascending colon: Secondary | ICD-10-CM

## 2022-06-03 DIAGNOSIS — C7931 Secondary malignant neoplasm of brain: Secondary | ICD-10-CM | POA: Diagnosis not present

## 2022-06-03 DIAGNOSIS — C7971 Secondary malignant neoplasm of right adrenal gland: Secondary | ICD-10-CM | POA: Diagnosis not present

## 2022-06-03 DIAGNOSIS — C189 Malignant neoplasm of colon, unspecified: Secondary | ICD-10-CM

## 2022-06-03 DIAGNOSIS — C786 Secondary malignant neoplasm of retroperitoneum and peritoneum: Secondary | ICD-10-CM | POA: Diagnosis not present

## 2022-06-03 DIAGNOSIS — C7802 Secondary malignant neoplasm of left lung: Secondary | ICD-10-CM | POA: Diagnosis not present

## 2022-06-03 DIAGNOSIS — C787 Secondary malignant neoplasm of liver and intrahepatic bile duct: Secondary | ICD-10-CM | POA: Diagnosis not present

## 2022-06-03 DIAGNOSIS — Z5111 Encounter for antineoplastic chemotherapy: Secondary | ICD-10-CM | POA: Diagnosis not present

## 2022-06-03 DIAGNOSIS — C7801 Secondary malignant neoplasm of right lung: Secondary | ICD-10-CM | POA: Diagnosis not present

## 2022-06-03 MED ORDER — FILGRASTIM-SNDZ 480 MCG/0.8ML IJ SOSY
480.0000 ug | PREFILLED_SYRINGE | Freq: Once | INTRAMUSCULAR | Status: AC
  Administered 2022-06-03: 480 ug via SUBCUTANEOUS

## 2022-06-03 NOTE — Patient Instructions (Signed)
Filgrastim Injection What is this medication? FILGRASTIM (fil GRA stim) lowers the risk of infection in people who are receiving chemotherapy. It works by helping your body make more white blood cells, which protects your body from infection. It may also be used to help people who have been exposed to high doses of radiation. It can be used to help prepare your body before a stem cell transplant. It works by helping your bone marrow make and release stem cells into the blood. This medicine may be used for other purposes; ask your health care provider or pharmacist if you have questions. COMMON BRAND NAME(S): Neupogen, Nivestym, Releuko, Zarxio What should I tell my care team before I take this medication? They need to know if you have any of these conditions: History of blood diseases, such as sickle cell anemia Kidney disease Recent or ongoing radiation An unusual or allergic reaction to filgrastim, pegfilgrastim, latex, rubber, other medications, foods, dyes, or preservatives Pregnant or trying to get pregnant Breast-feeding How should I use this medication? This medication is injected under the skin or into a vein. It is usually given by your care team in a hospital or clinic setting. It may be given at home. If you get this medication at home, you will be taught how to prepare and give it. Use exactly as directed. Take it as directed on the prescription label at the same time every day. Keep taking it unless your care team tells you to stop. It is important that you put your used needles and syringes in a special sharps container. Do not put them in a trash can. If you do not have a sharps container, call your pharmacist or care team to get one. This medication comes with INSTRUCTIONS FOR USE. Ask your pharmacist for directions on how to use this medication. Read the information carefully. Talk to your pharmacist or care team if you have questions. Talk to your care team about the use of this  medication in children. While it may be prescribed for children for selected conditions, precautions do apply. Overdosage: If you think you have taken too much of this medicine contact a poison control center or emergency room at once. NOTE: This medicine is only for you. Do not share this medicine with others. What if I miss a dose? It is important not to miss any doses. Talk to your care team about what to do if you miss a dose. What may interact with this medication? Medications that may cause a release of neutrophils, such as lithium This list may not describe all possible interactions. Give your health care provider a list of all the medicines, herbs, non-prescription drugs, or dietary supplements you use. Also tell them if you smoke, drink alcohol, or use illegal drugs. Some items may interact with your medicine. What should I watch for while using this medication? Your condition will be monitored carefully while you are receiving this medication. You may need bloodwork while taking this medication. Talk to your care team about your risk of cancer. You may be more at risk for certain types of cancer if you take this medication. What side effects may I notice from receiving this medication? Side effects that you should report to your care team as soon as possible: Allergic reactions--skin rash, itching, hives, swelling of the face, lips, tongue, or throat Capillary leak syndrome--stomach or muscle pain, unusual weakness or fatigue, feeling faint or lightheaded, decrease in the amount of urine, swelling of the ankles, hands, or   feet, trouble breathing High white blood cell level--fever, fatigue, trouble breathing, night sweats, change in vision, weight loss Inflammation of the aorta--fever, fatigue, back, chest, or stomach pain, severe headache Kidney injury (glomerulonephritis)--decrease in the amount of urine, red or dark brown urine, foamy or bubbly urine, swelling of the ankles, hands, or  feet Shortness of breath or trouble breathing Spleen injury--pain in upper left stomach or shoulder Unusual bruising or bleeding Side effects that usually do not require medical attention (report to your care team if they continue or are bothersome): Back pain Bone pain Fatigue Fever Headache Nausea This list may not describe all possible side effects. Call your doctor for medical advice about side effects. You may report side effects to FDA at 1-800-FDA-1088. Where should I keep my medication? Keep out of the reach of children and pets. Keep this medication in the original packaging until you are ready to take it. Protect from light. See product for storage information. Each product may have different instructions. Get rid of any unused medication after the expiration date. To get rid of medications that are no longer needed or have expired: Take the medication to a medications take-back program. Check with your pharmacy or law enforcement to find a location. If you cannot return the medication, ask your pharmacist or care team how to get rid of this medication safely. NOTE: This sheet is a summary. It may not cover all possible information. If you have questions about this medicine, talk to your doctor, pharmacist, or health care provider.  2023 Elsevier/Gold Standard (2021-08-26 00:00:00)  

## 2022-06-04 ENCOUNTER — Inpatient Hospital Stay: Payer: 59

## 2022-06-04 VITALS — BP 103/83 | HR 98 | Temp 98.1°F | Resp 18 | Ht 68.0 in | Wt 157.5 lb

## 2022-06-04 DIAGNOSIS — Z5189 Encounter for other specified aftercare: Secondary | ICD-10-CM | POA: Diagnosis not present

## 2022-06-04 DIAGNOSIS — C787 Secondary malignant neoplasm of liver and intrahepatic bile duct: Secondary | ICD-10-CM | POA: Diagnosis not present

## 2022-06-04 DIAGNOSIS — C182 Malignant neoplasm of ascending colon: Secondary | ICD-10-CM | POA: Diagnosis not present

## 2022-06-04 DIAGNOSIS — C7931 Secondary malignant neoplasm of brain: Secondary | ICD-10-CM | POA: Diagnosis not present

## 2022-06-04 DIAGNOSIS — C786 Secondary malignant neoplasm of retroperitoneum and peritoneum: Secondary | ICD-10-CM | POA: Diagnosis not present

## 2022-06-04 DIAGNOSIS — C189 Malignant neoplasm of colon, unspecified: Secondary | ICD-10-CM

## 2022-06-04 DIAGNOSIS — C7801 Secondary malignant neoplasm of right lung: Secondary | ICD-10-CM | POA: Diagnosis not present

## 2022-06-04 DIAGNOSIS — Z5111 Encounter for antineoplastic chemotherapy: Secondary | ICD-10-CM | POA: Diagnosis not present

## 2022-06-04 DIAGNOSIS — C7802 Secondary malignant neoplasm of left lung: Secondary | ICD-10-CM | POA: Diagnosis not present

## 2022-06-04 DIAGNOSIS — C7971 Secondary malignant neoplasm of right adrenal gland: Secondary | ICD-10-CM | POA: Diagnosis not present

## 2022-06-04 MED ORDER — FILGRASTIM-SNDZ 480 MCG/0.8ML IJ SOSY
480.0000 ug | PREFILLED_SYRINGE | Freq: Once | INTRAMUSCULAR | Status: AC
  Administered 2022-06-04: 480 ug via SUBCUTANEOUS
  Filled 2022-06-04: qty 0.8

## 2022-06-04 NOTE — Patient Instructions (Signed)
Filgrastim Injection What is this medication? FILGRASTIM (fil GRA stim) lowers the risk of infection in people who are receiving chemotherapy. It works by helping your body make more white blood cells, which protects your body from infection. It may also be used to help people who have been exposed to high doses of radiation. It can be used to help prepare your body before a stem cell transplant. It works by helping your bone marrow make and release stem cells into the blood. This medicine may be used for other purposes; ask your health care provider or pharmacist if you have questions. COMMON BRAND NAME(S): Neupogen, Nivestym, Releuko, Zarxio What should I tell my care team before I take this medication? They need to know if you have any of these conditions: History of blood diseases, such as sickle cell anemia Kidney disease Recent or ongoing radiation An unusual or allergic reaction to filgrastim, pegfilgrastim, latex, rubber, other medications, foods, dyes, or preservatives Pregnant or trying to get pregnant Breast-feeding How should I use this medication? This medication is injected under the skin or into a vein. It is usually given by your care team in a hospital or clinic setting. It may be given at home. If you get this medication at home, you will be taught how to prepare and give it. Use exactly as directed. Take it as directed on the prescription label at the same time every day. Keep taking it unless your care team tells you to stop. It is important that you put your used needles and syringes in a special sharps container. Do not put them in a trash can. If you do not have a sharps container, call your pharmacist or care team to get one. This medication comes with INSTRUCTIONS FOR USE. Ask your pharmacist for directions on how to use this medication. Read the information carefully. Talk to your pharmacist or care team if you have questions. Talk to your care team about the use of this  medication in children. While it may be prescribed for children for selected conditions, precautions do apply. Overdosage: If you think you have taken too much of this medicine contact a poison control center or emergency room at once. NOTE: This medicine is only for you. Do not share this medicine with others. What if I miss a dose? It is important not to miss any doses. Talk to your care team about what to do if you miss a dose. What may interact with this medication? Medications that may cause a release of neutrophils, such as lithium This list may not describe all possible interactions. Give your health care provider a list of all the medicines, herbs, non-prescription drugs, or dietary supplements you use. Also tell them if you smoke, drink alcohol, or use illegal drugs. Some items may interact with your medicine. What should I watch for while using this medication? Your condition will be monitored carefully while you are receiving this medication. You may need bloodwork while taking this medication. Talk to your care team about your risk of cancer. You may be more at risk for certain types of cancer if you take this medication. What side effects may I notice from receiving this medication? Side effects that you should report to your care team as soon as possible: Allergic reactions--skin rash, itching, hives, swelling of the face, lips, tongue, or throat Capillary leak syndrome--stomach or muscle pain, unusual weakness or fatigue, feeling faint or lightheaded, decrease in the amount of urine, swelling of the ankles, hands, or   feet, trouble breathing High white blood cell level--fever, fatigue, trouble breathing, night sweats, change in vision, weight loss Inflammation of the aorta--fever, fatigue, back, chest, or stomach pain, severe headache Kidney injury (glomerulonephritis)--decrease in the amount of urine, red or dark brown urine, foamy or bubbly urine, swelling of the ankles, hands, or  feet Shortness of breath or trouble breathing Spleen injury--pain in upper left stomach or shoulder Unusual bruising or bleeding Side effects that usually do not require medical attention (report to your care team if they continue or are bothersome): Back pain Bone pain Fatigue Fever Headache Nausea This list may not describe all possible side effects. Call your doctor for medical advice about side effects. You may report side effects to FDA at 1-800-FDA-1088. Where should I keep my medication? Keep out of the reach of children and pets. Keep this medication in the original packaging until you are ready to take it. Protect from light. See product for storage information. Each product may have different instructions. Get rid of any unused medication after the expiration date. To get rid of medications that are no longer needed or have expired: Take the medication to a medications take-back program. Check with your pharmacy or law enforcement to find a location. If you cannot return the medication, ask your pharmacist or care team how to get rid of this medication safely. NOTE: This sheet is a summary. It may not cover all possible information. If you have questions about this medicine, talk to your doctor, pharmacist, or health care provider.  2023 Elsevier/Gold Standard (2021-08-26 00:00:00)  

## 2022-06-07 ENCOUNTER — Encounter: Payer: Self-pay | Admitting: Oncology

## 2022-06-07 DIAGNOSIS — C182 Malignant neoplasm of ascending colon: Secondary | ICD-10-CM | POA: Diagnosis not present

## 2022-06-11 NOTE — Progress Notes (Signed)
[facility]  [address] [place],  [place]  [address] [phone]  Clinic Day:  06/12/2022  Referring physician: Greig Right, MD  HISTORY OF PRESENT ILLNESS:  The patient is a 57 y.o. female with  metastatic colon cancer, which includes spread of disease to her abdominal cavity, lungs, brain and right adrenal gland. She comes in today to be evaluated before heading into her 40th cycle of palliative FOLFIRI/Avastin chemotherapy.  Of note, leucovorin has been discontinued due to an allergic reaction from this agent that did not improve despite being predmedicated with diphenhydramine and famotidine. The patient claims to have tolerated her 39th cycle of treatment okay.  She continues to report intermittent diarrhea, but uses Imodium as needed.  She denies having any new symptoms which concern her for possible disease progression.   With respect to her colon cancer history, she is status post a right hemicolectomy in early October 2018, followed by 12 cycles of adjuvant FOLFOX chemotherapy, which were completed in April 2019.  In December 2021, she underwent a left cerebellar metastasectomy, whose pathology was consistent with metastatic colon cancer.  Tumor testing did come back MMR normal.  CT scans revealed evidence of her cancer being in multiple locations, for which she has been taking FOLFIRI/Avastin ever since for disease control.  PHYSICAL EXAM:  Blood pressure 117/65, pulse 99, temperature 97.8 F (36.6 C), resp. rate 16, height '5\' 8"'$  (1.727 m), weight 157 lb 12.8 oz (71.6 kg), last menstrual period 02/22/2019, SpO2 94 %.  Body mass index is 23.99 kg/m.  Performance status (ECOG): 1 - Symptomatic but completely ambulatory  Physical Exam Vitals and nursing note reviewed.  Constitutional:      General: She is not in acute distress.    Appearance: Normal appearance.  HENT:     Head: Normocephalic and atraumatic.     Mouth/Throat:     Mouth:  Mucous membranes are moist.     Pharynx: Oropharynx is clear. No oropharyngeal exudate or posterior oropharyngeal erythema.  Eyes:     General: No scleral icterus.    Extraocular Movements: Extraocular movements intact.     Conjunctiva/sclera: Conjunctivae normal.     Pupils: Pupils are equal, round, and reactive to light.  Cardiovascular:     Rate and Rhythm: Normal rate and regular rhythm.     Heart sounds: Normal heart sounds. No murmur heard.    No friction rub. No gallop.  Pulmonary:     Effort: Pulmonary effort is normal.     Breath sounds: Normal breath sounds. No wheezing, rhonchi or rales.  Abdominal:     General: There is no distension.     Palpations: Abdomen is soft. There is no hepatomegaly, splenomegaly or mass.     Tenderness: There is no abdominal tenderness.  Musculoskeletal:        General: Normal range of motion.     Cervical back: Normal range of motion and neck supple. No tenderness.     Right lower leg: No edema.     Left lower leg: No edema.  Lymphadenopathy:     Cervical: No cervical adenopathy.     Upper Body:     Right upper body: No supraclavicular or axillary adenopathy.     Left upper body: No supraclavicular or axillary adenopathy.     Lower Body: No right inguinal adenopathy. No left inguinal adenopathy.  Skin:    General: Skin is warm and dry.     Coloration: Skin is not  jaundiced.     Findings: No rash.  Neurological:     Mental Status: She is alert and oriented to person, place, and time.     Cranial Nerves: No cranial nerve deficit.  Psychiatric:        Mood and Affect: Mood normal.        Behavior: Behavior normal.        Thought Content: Thought content normal.    LABS:       Latest Ref Rng & Units 05/22/2022   12:00 AM 05/01/2022   11:15 AM 04/27/2022   12:00 AM  CBC  WBC  2.7     7.6  5.0      Hemoglobin 12.0 - 16.0 12.9     13.1  13.2      Hematocrit 36 - 46 39     42.3  40      Platelets 150 - 400 K/uL 110     130  100          This result is from an external source.      Latest Ref Rng & Units 06/12/2022    2:55 PM 05/22/2022    2:40 PM 05/01/2022   11:15 AM  CMP  Glucose 70 - 99 mg/dL 136  202  112   BUN 6 - 20 mg/dL '15  17  16   '$ Creatinine 0.44 - 1.00 mg/dL 1.55  1.75  1.43   Sodium 135 - 145 mmol/L 140  139  141   Potassium 3.5 - 5.1 mmol/L 4.3  4.1  4.0   Chloride 98 - 111 mmol/L 102  108  103   CO2 22 - 32 mmol/L '26  21  28   '$ Calcium 8.9 - 10.3 mg/dL 9.6  9.4  9.1   Total Protein 6.5 - 8.1 g/dL 7.3  7.0  7.0   Total Bilirubin 0.3 - 1.2 mg/dL 0.4  0.4  0.5   Alkaline Phos 38 - 126 U/L 125  108  104   AST 15 - 41 U/L '25  25  21   '$ ALT 0 - 44 U/L '26  23  15    '$ ASSESSMENT & PLAN:  Assessment/Plan:  A 57 y.o. female with metastatic colon cancer.  She will proceed with her 40th cycle of FOLFIRI/Avastin (without leucovorin) next week, which she will continue to receive every 3 weeks.  Clinically, she is doing well.  I will see her back in 3 weeks before she heads into her 41st cycle of FOLFIRI/Avastin. CT scans will be done a day before her next visit to ascertain her new disease baseline after 40 cycles of FOLFIRI/Avastin.  The patient understands all the plans discussed today and is in agreement with them.    Malkie Wille Macarthur Critchley, MD

## 2022-06-12 ENCOUNTER — Inpatient Hospital Stay: Payer: 59

## 2022-06-12 ENCOUNTER — Other Ambulatory Visit: Payer: Self-pay | Admitting: Oncology

## 2022-06-12 ENCOUNTER — Inpatient Hospital Stay (HOSPITAL_BASED_OUTPATIENT_CLINIC_OR_DEPARTMENT_OTHER): Payer: 59 | Admitting: Oncology

## 2022-06-12 DIAGNOSIS — C182 Malignant neoplasm of ascending colon: Secondary | ICD-10-CM | POA: Diagnosis not present

## 2022-06-12 DIAGNOSIS — C189 Malignant neoplasm of colon, unspecified: Secondary | ICD-10-CM | POA: Diagnosis not present

## 2022-06-12 DIAGNOSIS — Z5111 Encounter for antineoplastic chemotherapy: Secondary | ICD-10-CM | POA: Diagnosis not present

## 2022-06-12 DIAGNOSIS — C7800 Secondary malignant neoplasm of unspecified lung: Secondary | ICD-10-CM | POA: Diagnosis not present

## 2022-06-12 DIAGNOSIS — C787 Secondary malignant neoplasm of liver and intrahepatic bile duct: Secondary | ICD-10-CM

## 2022-06-12 DIAGNOSIS — C7971 Secondary malignant neoplasm of right adrenal gland: Secondary | ICD-10-CM | POA: Diagnosis not present

## 2022-06-12 DIAGNOSIS — C786 Secondary malignant neoplasm of retroperitoneum and peritoneum: Secondary | ICD-10-CM | POA: Diagnosis not present

## 2022-06-12 DIAGNOSIS — C7931 Secondary malignant neoplasm of brain: Secondary | ICD-10-CM | POA: Diagnosis not present

## 2022-06-12 DIAGNOSIS — C7802 Secondary malignant neoplasm of left lung: Secondary | ICD-10-CM | POA: Diagnosis not present

## 2022-06-12 DIAGNOSIS — Z5189 Encounter for other specified aftercare: Secondary | ICD-10-CM | POA: Diagnosis not present

## 2022-06-12 DIAGNOSIS — C7801 Secondary malignant neoplasm of right lung: Secondary | ICD-10-CM | POA: Diagnosis not present

## 2022-06-12 LAB — CMP (CANCER CENTER ONLY)
ALT: 26 U/L (ref 0–44)
AST: 25 U/L (ref 15–41)
Albumin: 4.4 g/dL (ref 3.5–5.0)
Alkaline Phosphatase: 125 U/L (ref 38–126)
Anion gap: 12 (ref 5–15)
BUN: 15 mg/dL (ref 6–20)
CO2: 26 mmol/L (ref 22–32)
Calcium: 9.6 mg/dL (ref 8.9–10.3)
Chloride: 102 mmol/L (ref 98–111)
Creatinine: 1.55 mg/dL — ABNORMAL HIGH (ref 0.44–1.00)
GFR, Estimated: 40 mL/min — ABNORMAL LOW (ref >60)
Glucose, Bld: 136 mg/dL — ABNORMAL HIGH (ref 70–99)
Potassium: 4.3 mmol/L (ref 3.5–5.1)
Sodium: 140 mmol/L (ref 135–145)
Total Bilirubin: 0.4 mg/dL (ref 0.3–1.2)
Total Protein: 7.3 g/dL (ref 6.5–8.1)

## 2022-06-12 LAB — CBC AND DIFFERENTIAL
HCT: 40 (ref 36–46)
Hemoglobin: 13.1 (ref 12.0–16.0)
Neutrophils Absolute: 3.95
Platelets: 96 10*3/uL — AB (ref 150–400)
WBC: 5.2

## 2022-06-12 LAB — CBC W DIFFERENTIAL (~~LOC~~ CC SCANNED REPORT)

## 2022-06-12 LAB — CBC: RBC: 4.13 (ref 3.87–5.11)

## 2022-06-12 LAB — TOTAL PROTEIN, URINE DIPSTICK: Protein, ur: NEGATIVE mg/dL

## 2022-06-14 ENCOUNTER — Other Ambulatory Visit: Payer: Self-pay | Admitting: Psychiatry

## 2022-06-14 ENCOUNTER — Encounter: Payer: Self-pay | Admitting: Oncology

## 2022-06-14 DIAGNOSIS — F333 Major depressive disorder, recurrent, severe with psychotic symptoms: Secondary | ICD-10-CM

## 2022-06-15 ENCOUNTER — Telehealth: Payer: Self-pay | Admitting: Oncology

## 2022-06-15 ENCOUNTER — Other Ambulatory Visit: Payer: Self-pay | Admitting: Oncology

## 2022-06-15 ENCOUNTER — Encounter: Payer: Self-pay | Admitting: Oncology

## 2022-06-15 ENCOUNTER — Other Ambulatory Visit: Payer: Self-pay | Admitting: Pharmacist

## 2022-06-15 DIAGNOSIS — R110 Nausea: Secondary | ICD-10-CM

## 2022-06-15 DIAGNOSIS — C189 Malignant neoplasm of colon, unspecified: Secondary | ICD-10-CM

## 2022-06-15 DIAGNOSIS — C182 Malignant neoplasm of ascending colon: Secondary | ICD-10-CM

## 2022-06-15 MED FILL — Irinotecan HCl Inj 100 MG/5ML (20 MG/ML): INTRAVENOUS | Qty: 14 | Status: AC

## 2022-06-15 MED FILL — Dexamethasone Sodium Phosphate Inj 100 MG/10ML: INTRAMUSCULAR | Qty: 1 | Status: AC

## 2022-06-15 MED FILL — Fluorouracil IV Soln 2.5 GM/50ML (50 MG/ML): INTRAVENOUS | Qty: 16 | Status: AC

## 2022-06-15 MED FILL — Fosaprepitant Dimeglumine For IV Infusion 150 MG (Base Eq): INTRAVENOUS | Qty: 5 | Status: AC

## 2022-06-15 MED FILL — Fluorouracil IV Soln 5 GM/100ML (50 MG/ML): INTRAVENOUS | Qty: 89 | Status: AC

## 2022-06-15 MED FILL — Bevacizumab-awwb IV Soln 400 MG/16ML (For Infusion): INTRAVENOUS | Qty: 14 | Status: AC

## 2022-06-15 NOTE — Progress Notes (Signed)
Proceed with chemo despite plt=96K and scr=1.55 per Dr. Bobby Rumpf.  Patient will receive extra IVF with this cycle to help improve renal function.

## 2022-06-15 NOTE — Telephone Encounter (Signed)
CT C/A/P has been scheduled for 07/02/22 @ 10:30 am ; Check in @ 9:30 am   LVM notifying pt of date,time and instructions.

## 2022-06-15 NOTE — Telephone Encounter (Signed)
CT C/A/P has been s  Notified pt of date,time and instructions.

## 2022-06-15 NOTE — Telephone Encounter (Signed)
scheduled

## 2022-06-16 ENCOUNTER — Inpatient Hospital Stay: Payer: 59

## 2022-06-16 VITALS — BP 134/77 | HR 87 | Temp 97.7°F | Resp 18

## 2022-06-16 DIAGNOSIS — C7801 Secondary malignant neoplasm of right lung: Secondary | ICD-10-CM | POA: Diagnosis not present

## 2022-06-16 DIAGNOSIS — C182 Malignant neoplasm of ascending colon: Secondary | ICD-10-CM

## 2022-06-16 DIAGNOSIS — Z5189 Encounter for other specified aftercare: Secondary | ICD-10-CM | POA: Diagnosis not present

## 2022-06-16 DIAGNOSIS — C786 Secondary malignant neoplasm of retroperitoneum and peritoneum: Secondary | ICD-10-CM | POA: Diagnosis not present

## 2022-06-16 DIAGNOSIS — C189 Malignant neoplasm of colon, unspecified: Secondary | ICD-10-CM

## 2022-06-16 DIAGNOSIS — C7931 Secondary malignant neoplasm of brain: Secondary | ICD-10-CM | POA: Diagnosis not present

## 2022-06-16 DIAGNOSIS — Z5111 Encounter for antineoplastic chemotherapy: Secondary | ICD-10-CM | POA: Diagnosis not present

## 2022-06-16 DIAGNOSIS — C787 Secondary malignant neoplasm of liver and intrahepatic bile duct: Secondary | ICD-10-CM | POA: Diagnosis not present

## 2022-06-16 DIAGNOSIS — C7971 Secondary malignant neoplasm of right adrenal gland: Secondary | ICD-10-CM | POA: Diagnosis not present

## 2022-06-16 DIAGNOSIS — C7802 Secondary malignant neoplasm of left lung: Secondary | ICD-10-CM | POA: Diagnosis not present

## 2022-06-16 MED ORDER — SODIUM CHLORIDE 0.9 % IV SOLN: Freq: Once | INTRAVENOUS | Status: AC

## 2022-06-16 MED ORDER — FAMOTIDINE IN NACL 20-0.9 MG/50ML-% IV SOLN
20.0000 mg | Freq: Once | INTRAVENOUS | Status: AC
  Administered 2022-06-16: 20 mg via INTRAVENOUS
  Filled 2022-06-16: qty 50

## 2022-06-16 MED ORDER — SODIUM CHLORIDE 0.9 % IV SOLN
150.0000 mg | Freq: Once | INTRAVENOUS | Status: AC
  Administered 2022-06-16: 150 mg via INTRAVENOUS
  Filled 2022-06-16: qty 150

## 2022-06-16 MED ORDER — SODIUM CHLORIDE 0.9 % IV SOLN
5.0000 mg/kg | Freq: Once | INTRAVENOUS | Status: AC
  Administered 2022-06-16: 350 mg via INTRAVENOUS
  Filled 2022-06-16: qty 14

## 2022-06-16 MED ORDER — IRINOTECAN HCL CHEMO INJECTION 100 MG/5ML
144.0000 mg/m2 | Freq: Once | INTRAVENOUS | Status: AC
  Administered 2022-06-16: 280 mg via INTRAVENOUS
  Filled 2022-06-16: qty 10

## 2022-06-16 MED ORDER — SODIUM CHLORIDE 0.9 % IV SOLN
10.0000 mg | Freq: Once | INTRAVENOUS | Status: AC
  Administered 2022-06-16: 10 mg via INTRAVENOUS
  Filled 2022-06-16: qty 10

## 2022-06-16 MED ORDER — DIPHENHYDRAMINE HCL 50 MG/ML IJ SOLN
25.0000 mg | Freq: Once | INTRAMUSCULAR | Status: AC
  Administered 2022-06-16: 25 mg via INTRAVENOUS
  Filled 2022-06-16: qty 1

## 2022-06-16 MED ORDER — PALONOSETRON HCL INJECTION 0.25 MG/5ML
0.2500 mg | Freq: Once | INTRAVENOUS | Status: AC
  Administered 2022-06-16: 0.25 mg via INTRAVENOUS
  Filled 2022-06-16: qty 5

## 2022-06-16 MED ORDER — ATROPINE SULFATE 1 MG/ML IV SOLN
0.5000 mg | Freq: Once | INTRAVENOUS | Status: AC | PRN
  Administered 2022-06-16: 0.5 mg via INTRAVENOUS
  Filled 2022-06-16: qty 1

## 2022-06-16 MED ORDER — FLUOROURACIL CHEMO INJECTION 2.5 GM/50ML
400.0000 mg/m2 | Freq: Once | INTRAVENOUS | Status: AC
  Administered 2022-06-16: 800 mg via INTRAVENOUS
  Filled 2022-06-16: qty 16

## 2022-06-16 MED ORDER — SODIUM CHLORIDE 0.9 % IV SOLN
2400.0000 mg/m2 | INTRAVENOUS | Status: DC
  Administered 2022-06-16: 4450 mg via INTRAVENOUS
  Filled 2022-06-16: qty 89

## 2022-06-16 NOTE — Patient Instructions (Signed)
[facility]  Discharge Instructions: Thank you for choosing [facility] to provide your oncology and hematology care.  If you have a lab appointment with the [facility], please go directly to the [facility] and check in at the registration area.   Wear comfortable clothing and clothing appropriate for easy access to any Portacath or PICC line.   We strive to give you quality time with your provider. You may need to reschedule your appointment if you arrive late (15 or more minutes).  Arriving late affects you and other patients whose appointments are after yours.  Also, if you miss three or more appointments without notifying the office, you may be dismissed from the clinic at the provider's discretion.      For prescription refill requests, have your pharmacy contact our office and allow 72 hours for refills to be completed.    Today you received the following chemotherapy and/or immunotherapy agents Bevacizumab, irinotecan, and Flourouracil      To help prevent nausea and vomiting after your treatment, we encourage you to take your nausea medication as directed.  BELOW ARE SYMPTOMS THAT SHOULD BE REPORTED IMMEDIATELY: *FEVER GREATER THAN 100.4 F (38 C) OR HIGHER *CHILLS OR SWEATING *NAUSEA AND VOMITING THAT IS NOT CONTROLLED WITH YOUR NAUSEA MEDICATION *UNUSUAL SHORTNESS OF BREATH *UNUSUAL BRUISING OR BLEEDING *URINARY PROBLEMS (pain or burning when urinating, or frequent urination) *BOWEL PROBLEMS (unusual diarrhea, constipation, pain near the anus) TENDERNESS IN MOUTH AND THROAT WITH OR WITHOUT PRESENCE OF ULCERS (sore throat, sores in mouth, or a toothache) UNUSUAL RASH, SWELLING OR PAIN  UNUSUAL VAGINAL DISCHARGE OR ITCHING   Items with * indicate a potential emergency and should be followed up as soon as possible or go to the Emergency Department if any problems should occur.  Please show the CHEMOTHERAPY ALERT CARD or IMMUNOTHERAPY  ALERT CARD at check-in to the Emergency Department and triage nurse.  Should you have questions after your visit or need to cancel or reschedule your appointment, please contact [facility]  Dept: [phone]  and follow the prompts.  Office hours are 8:00 a.m. to 4:30 p.m. Monday - Friday. Please note that voicemails left after 4:00 p.m. may not be returned until the following business day.  We are closed weekends and major holidays. You have access to a nurse at all times for urgent questions. Please call the main number to the clinic Dept: [phone] and follow the prompts.  For any non-urgent questions, you may also contact your provider using MyChart. We now offer e-Visits for anyone 51 and older to request care online for non-urgent symptoms. For details visit [email or web address].   Also download the MyChart app! Go to the app store, search "MyChart", open the app, select [facility], and log in with your MyChart username and password.

## 2022-06-18 ENCOUNTER — Inpatient Hospital Stay: Payer: 59

## 2022-06-18 VITALS — BP 103/82 | HR 93 | Temp 97.4°F | Resp 18

## 2022-06-18 DIAGNOSIS — C7931 Secondary malignant neoplasm of brain: Secondary | ICD-10-CM | POA: Diagnosis not present

## 2022-06-18 DIAGNOSIS — Z5111 Encounter for antineoplastic chemotherapy: Secondary | ICD-10-CM | POA: Diagnosis not present

## 2022-06-18 DIAGNOSIS — C7802 Secondary malignant neoplasm of left lung: Secondary | ICD-10-CM | POA: Diagnosis not present

## 2022-06-18 DIAGNOSIS — C786 Secondary malignant neoplasm of retroperitoneum and peritoneum: Secondary | ICD-10-CM | POA: Diagnosis not present

## 2022-06-18 DIAGNOSIS — Z5189 Encounter for other specified aftercare: Secondary | ICD-10-CM | POA: Diagnosis not present

## 2022-06-18 DIAGNOSIS — C189 Malignant neoplasm of colon, unspecified: Secondary | ICD-10-CM

## 2022-06-18 DIAGNOSIS — C787 Secondary malignant neoplasm of liver and intrahepatic bile duct: Secondary | ICD-10-CM | POA: Diagnosis not present

## 2022-06-18 DIAGNOSIS — C182 Malignant neoplasm of ascending colon: Secondary | ICD-10-CM | POA: Diagnosis not present

## 2022-06-18 DIAGNOSIS — C7971 Secondary malignant neoplasm of right adrenal gland: Secondary | ICD-10-CM | POA: Diagnosis not present

## 2022-06-18 DIAGNOSIS — C7801 Secondary malignant neoplasm of right lung: Secondary | ICD-10-CM | POA: Diagnosis not present

## 2022-06-18 MED ORDER — HEPARIN SOD (PORK) LOCK FLUSH 100 UNIT/ML IV SOLN
500.0000 [IU] | Freq: Once | INTRAVENOUS | Status: AC | PRN
  Administered 2022-06-18: 500 [IU]

## 2022-06-18 MED ORDER — SODIUM CHLORIDE 0.9 % IV SOLN: Freq: Once | INTRAVENOUS | Status: AC

## 2022-06-18 MED ORDER — SODIUM CHLORIDE 0.9% FLUSH
10.0000 mL | INTRAVENOUS | Status: DC | PRN
  Administered 2022-06-18: 10 mL

## 2022-06-18 NOTE — Patient Instructions (Signed)

## 2022-06-23 ENCOUNTER — Inpatient Hospital Stay: Payer: 59

## 2022-06-23 VITALS — BP 100/78 | HR 92 | Temp 97.3°F | Resp 18 | Wt 154.1 lb

## 2022-06-23 DIAGNOSIS — C7931 Secondary malignant neoplasm of brain: Secondary | ICD-10-CM | POA: Diagnosis not present

## 2022-06-23 DIAGNOSIS — C189 Malignant neoplasm of colon, unspecified: Secondary | ICD-10-CM

## 2022-06-23 DIAGNOSIS — Z5189 Encounter for other specified aftercare: Secondary | ICD-10-CM | POA: Diagnosis not present

## 2022-06-23 DIAGNOSIS — C787 Secondary malignant neoplasm of liver and intrahepatic bile duct: Secondary | ICD-10-CM | POA: Diagnosis not present

## 2022-06-23 DIAGNOSIS — C7802 Secondary malignant neoplasm of left lung: Secondary | ICD-10-CM | POA: Diagnosis not present

## 2022-06-23 DIAGNOSIS — C7801 Secondary malignant neoplasm of right lung: Secondary | ICD-10-CM | POA: Diagnosis not present

## 2022-06-23 DIAGNOSIS — C7971 Secondary malignant neoplasm of right adrenal gland: Secondary | ICD-10-CM | POA: Diagnosis not present

## 2022-06-23 DIAGNOSIS — C182 Malignant neoplasm of ascending colon: Secondary | ICD-10-CM | POA: Diagnosis not present

## 2022-06-23 DIAGNOSIS — C786 Secondary malignant neoplasm of retroperitoneum and peritoneum: Secondary | ICD-10-CM | POA: Diagnosis not present

## 2022-06-23 DIAGNOSIS — Z5111 Encounter for antineoplastic chemotherapy: Secondary | ICD-10-CM | POA: Diagnosis not present

## 2022-06-23 MED ORDER — FILGRASTIM-SNDZ 480 MCG/0.8ML IJ SOSY
480.0000 ug | PREFILLED_SYRINGE | Freq: Once | INTRAMUSCULAR | Status: AC
  Administered 2022-06-23: 480 ug via SUBCUTANEOUS
  Filled 2022-06-23: qty 0.8

## 2022-06-23 NOTE — Patient Instructions (Signed)
Filgrastim Injection What is this medication? FILGRASTIM (fil GRA stim) lowers the risk of infection in people who are receiving chemotherapy. It works by Building control surveyor make more white blood cells, which protects your body from infection. It may also be used to help people who have been exposed to high doses of radiation. It can be used to help prepare your body before a stem cell transplant. It works by helping your bone marrow make and release stem cells into the blood. This medicine may be used for other purposes; ask your health care provider or pharmacist if you have questions. COMMON BRAND NAME(S): Neupogen, Nivestym, Releuko, Zarxio What should I tell my care team before I take this medication? They need to know if you have any of these conditions: History of blood diseases, such as sickle cell anemia Kidney disease Recent or ongoing radiation An unusual or allergic reaction to filgrastim, pegfilgrastim, latex, rubber, other medications, foods, dyes, or preservatives Pregnant or trying to get pregnant Breast-feeding How should I use this medication? This medication is injected under the skin or into a vein. It is usually given by your care team in a hospital or clinic setting. It may be given at home. If you get this medication at home, you will be taught how to prepare and give it. Use exactly as directed. Take it as directed on the prescription label at the same time every day. Keep taking it unless your care team tells you to stop. It is important that you put your used needles and syringes in a special sharps container. Do not put them in a trash can. If you do not have a sharps container, call your pharmacist or care team to get one. This medication comes with INSTRUCTIONS FOR USE. Ask your pharmacist for directions on how to use this medication. Read the information carefully. Talk to your pharmacist or care team if you have questions. Talk to your care team about the use of this  medication in children. While it may be prescribed for children for selected conditions, precautions do apply. Overdosage: If you think you have taken too much of this medicine contact a poison control center or emergency room at once. NOTE: This medicine is only for you. Do not share this medicine with others. What if I miss a dose? It is important not to miss any doses. Talk to your care team about what to do if you miss a dose. What may interact with this medication? Medications that may cause a release of neutrophils, such as lithium This list may not describe all possible interactions. Give your health care provider a list of all the medicines, herbs, non-prescription drugs, or dietary supplements you use. Also tell them if you smoke, drink alcohol, or use illegal drugs. Some items may interact with your medicine. What should I watch for while using this medication? Your condition will be monitored carefully while you are receiving this medication. You may need bloodwork while taking this medication. Talk to your care team about your risk of cancer. You may be more at risk for certain types of cancer if you take this medication. What side effects may I notice from receiving this medication? Side effects that you should report to your care team as soon as possible: Allergic reactions--skin rash, itching, hives, swelling of the face, lips, tongue, or throat Capillary leak syndrome--stomach or muscle pain, unusual weakness or fatigue, feeling faint or lightheaded, decrease in the amount of urine, swelling of the ankles, hands, or  feet, trouble breathing High white blood cell level--fever, fatigue, trouble breathing, night sweats, change in vision, weight loss Inflammation of the aorta--fever, fatigue, back, chest, or stomach pain, severe headache Kidney injury (glomerulonephritis)--decrease in the amount of urine, red or dark brown urine, foamy or bubbly urine, swelling of the ankles, hands, or  feet Shortness of breath or trouble breathing Spleen injury--pain in upper left stomach or shoulder Unusual bruising or bleeding Side effects that usually do not require medical attention (report to your care team if they continue or are bothersome): Back pain Bone pain Fatigue Fever Headache Nausea This list may not describe all possible side effects. Call your doctor for medical advice about side effects. You may report side effects to FDA at 1-800-FDA-1088. Where should I keep my medication? Keep out of the reach of children and pets. Keep this medication in the original packaging until you are ready to take it. Protect from light. See product for storage information. Each product may have different instructions. Get rid of any unused medication after the expiration date. To get rid of medications that are no longer needed or have expired: Take the medication to a medications take-back program. Check with your pharmacy or law enforcement to find a location. If you cannot return the medication, ask your pharmacist or care team how to get rid of this medication safely. NOTE: This sheet is a summary. It may not cover all possible information. If you have questions about this medicine, talk to your doctor, pharmacist, or health care provider.  2023 Elsevier/Gold Standard (2021-08-26 00:00:00)

## 2022-06-24 ENCOUNTER — Inpatient Hospital Stay: Payer: 59

## 2022-06-24 VITALS — BP 113/75 | HR 83 | Temp 97.9°F | Resp 18 | Wt 155.0 lb

## 2022-06-24 DIAGNOSIS — C182 Malignant neoplasm of ascending colon: Secondary | ICD-10-CM

## 2022-06-24 DIAGNOSIS — C787 Secondary malignant neoplasm of liver and intrahepatic bile duct: Secondary | ICD-10-CM | POA: Diagnosis not present

## 2022-06-24 DIAGNOSIS — C7800 Secondary malignant neoplasm of unspecified lung: Secondary | ICD-10-CM

## 2022-06-24 DIAGNOSIS — C7801 Secondary malignant neoplasm of right lung: Secondary | ICD-10-CM | POA: Diagnosis not present

## 2022-06-24 DIAGNOSIS — C189 Malignant neoplasm of colon, unspecified: Secondary | ICD-10-CM

## 2022-06-24 DIAGNOSIS — C7802 Secondary malignant neoplasm of left lung: Secondary | ICD-10-CM | POA: Diagnosis not present

## 2022-06-24 DIAGNOSIS — C7971 Secondary malignant neoplasm of right adrenal gland: Secondary | ICD-10-CM | POA: Diagnosis not present

## 2022-06-24 DIAGNOSIS — C786 Secondary malignant neoplasm of retroperitoneum and peritoneum: Secondary | ICD-10-CM | POA: Diagnosis not present

## 2022-06-24 DIAGNOSIS — Z5189 Encounter for other specified aftercare: Secondary | ICD-10-CM | POA: Diagnosis not present

## 2022-06-24 DIAGNOSIS — C7931 Secondary malignant neoplasm of brain: Secondary | ICD-10-CM | POA: Diagnosis not present

## 2022-06-24 DIAGNOSIS — Z5111 Encounter for antineoplastic chemotherapy: Secondary | ICD-10-CM | POA: Diagnosis not present

## 2022-06-24 MED ORDER — FILGRASTIM-SNDZ 480 MCG/0.8ML IJ SOSY
480.0000 ug | PREFILLED_SYRINGE | Freq: Once | INTRAMUSCULAR | Status: AC
  Administered 2022-06-24: 480 ug via SUBCUTANEOUS

## 2022-06-24 NOTE — Patient Instructions (Signed)
Filgrastim Injection What is this medication? FILGRASTIM (fil GRA stim) lowers the risk of infection in people who are receiving chemotherapy. It works by Building control surveyor make more white blood cells, which protects your body from infection. It may also be used to help people who have been exposed to high doses of radiation. It can be used to help prepare your body before a stem cell transplant. It works by helping your bone marrow make and release stem cells into the blood. This medicine may be used for other purposes; ask your health care provider or pharmacist if you have questions. COMMON BRAND NAME(S): Neupogen, Nivestym, Releuko, Zarxio What should I tell my care team before I take this medication? They need to know if you have any of these conditions: History of blood diseases, such as sickle cell anemia Kidney disease Recent or ongoing radiation An unusual or allergic reaction to filgrastim, pegfilgrastim, latex, rubber, other medications, foods, dyes, or preservatives Pregnant or trying to get pregnant Breast-feeding How should I use this medication? This medication is injected under the skin or into a vein. It is usually given by your care team in a hospital or clinic setting. It may be given at home. If you get this medication at home, you will be taught how to prepare and give it. Use exactly as directed. Take it as directed on the prescription label at the same time every day. Keep taking it unless your care team tells you to stop. It is important that you put your used needles and syringes in a special sharps container. Do not put them in a trash can. If you do not have a sharps container, call your pharmacist or care team to get one. This medication comes with INSTRUCTIONS FOR USE. Ask your pharmacist for directions on how to use this medication. Read the information carefully. Talk to your pharmacist or care team if you have questions. Talk to your care team about the use of this  medication in children. While it may be prescribed for children for selected conditions, precautions do apply. Overdosage: If you think you have taken too much of this medicine contact a poison control center or emergency room at once. NOTE: This medicine is only for you. Do not share this medicine with others. What if I miss a dose? It is important not to miss any doses. Talk to your care team about what to do if you miss a dose. What may interact with this medication? Medications that may cause a release of neutrophils, such as lithium This list may not describe all possible interactions. Give your health care provider a list of all the medicines, herbs, non-prescription drugs, or dietary supplements you use. Also tell them if you smoke, drink alcohol, or use illegal drugs. Some items may interact with your medicine. What should I watch for while using this medication? Your condition will be monitored carefully while you are receiving this medication. You may need bloodwork while taking this medication. Talk to your care team about your risk of cancer. You may be more at risk for certain types of cancer if you take this medication. What side effects may I notice from receiving this medication? Side effects that you should report to your care team as soon as possible: Allergic reactions--skin rash, itching, hives, swelling of the face, lips, tongue, or throat Capillary leak syndrome--stomach or muscle pain, unusual weakness or fatigue, feeling faint or lightheaded, decrease in the amount of urine, swelling of the ankles, hands, or  feet, trouble breathing High white blood cell level--fever, fatigue, trouble breathing, night sweats, change in vision, weight loss Inflammation of the aorta--fever, fatigue, back, chest, or stomach pain, severe headache Kidney injury (glomerulonephritis)--decrease in the amount of urine, red or dark brown urine, foamy or bubbly urine, swelling of the ankles, hands, or  feet Shortness of breath or trouble breathing Spleen injury--pain in upper left stomach or shoulder Unusual bruising or bleeding Side effects that usually do not require medical attention (report to your care team if they continue or are bothersome): Back pain Bone pain Fatigue Fever Headache Nausea This list may not describe all possible side effects. Call your doctor for medical advice about side effects. You may report side effects to FDA at 1-800-FDA-1088. Where should I keep my medication? Keep out of the reach of children and pets. Keep this medication in the original packaging until you are ready to take it. Protect from light. See product for storage information. Each product may have different instructions. Get rid of any unused medication after the expiration date. To get rid of medications that are no longer needed or have expired: Take the medication to a medications take-back program. Check with your pharmacy or law enforcement to find a location. If you cannot return the medication, ask your pharmacist or care team how to get rid of this medication safely. NOTE: This sheet is a summary. It may not cover all possible information. If you have questions about this medicine, talk to your doctor, pharmacist, or health care provider.  2023 Elsevier/Gold Standard (2021-08-26 00:00:00)

## 2022-06-25 ENCOUNTER — Ambulatory Visit: Payer: 59

## 2022-06-26 ENCOUNTER — Inpatient Hospital Stay: Payer: 59

## 2022-06-26 VITALS — BP 103/70 | HR 92 | Temp 98.5°F | Resp 18 | Wt 155.1 lb

## 2022-06-26 DIAGNOSIS — Z5111 Encounter for antineoplastic chemotherapy: Secondary | ICD-10-CM | POA: Diagnosis not present

## 2022-06-26 DIAGNOSIS — C787 Secondary malignant neoplasm of liver and intrahepatic bile duct: Secondary | ICD-10-CM | POA: Diagnosis not present

## 2022-06-26 DIAGNOSIS — C182 Malignant neoplasm of ascending colon: Secondary | ICD-10-CM | POA: Diagnosis not present

## 2022-06-26 DIAGNOSIS — C7931 Secondary malignant neoplasm of brain: Secondary | ICD-10-CM | POA: Diagnosis not present

## 2022-06-26 DIAGNOSIS — Z5189 Encounter for other specified aftercare: Secondary | ICD-10-CM | POA: Diagnosis not present

## 2022-06-26 DIAGNOSIS — C7802 Secondary malignant neoplasm of left lung: Secondary | ICD-10-CM | POA: Diagnosis not present

## 2022-06-26 DIAGNOSIS — C7801 Secondary malignant neoplasm of right lung: Secondary | ICD-10-CM | POA: Diagnosis not present

## 2022-06-26 DIAGNOSIS — C189 Malignant neoplasm of colon, unspecified: Secondary | ICD-10-CM

## 2022-06-26 DIAGNOSIS — C786 Secondary malignant neoplasm of retroperitoneum and peritoneum: Secondary | ICD-10-CM | POA: Diagnosis not present

## 2022-06-26 DIAGNOSIS — C7971 Secondary malignant neoplasm of right adrenal gland: Secondary | ICD-10-CM | POA: Diagnosis not present

## 2022-06-26 MED ORDER — FILGRASTIM-SNDZ 480 MCG/0.8ML IJ SOSY
480.0000 ug | PREFILLED_SYRINGE | Freq: Once | INTRAMUSCULAR | Status: AC
  Administered 2022-06-26: 480 ug via SUBCUTANEOUS
  Filled 2022-06-26: qty 0.8

## 2022-06-26 NOTE — Patient Instructions (Signed)
Filgrastim Injection What is this medication? FILGRASTIM (fil GRA stim) lowers the risk of infection in people who are receiving chemotherapy. It works by Building control surveyor make more white blood cells, which protects your body from infection. It may also be used to help people who have been exposed to high doses of radiation. It can be used to help prepare your body before a stem cell transplant. It works by helping your bone marrow make and release stem cells into the blood. This medicine may be used for other purposes; ask your health care provider or pharmacist if you have questions. COMMON BRAND NAME(S): Neupogen, Nivestym, Releuko, Zarxio What should I tell my care team before I take this medication? They need to know if you have any of these conditions: History of blood diseases, such as sickle cell anemia Kidney disease Recent or ongoing radiation An unusual or allergic reaction to filgrastim, pegfilgrastim, latex, rubber, other medications, foods, dyes, or preservatives Pregnant or trying to get pregnant Breast-feeding How should I use this medication? This medication is injected under the skin or into a vein. It is usually given by your care team in a hospital or clinic setting. It may be given at home. If you get this medication at home, you will be taught how to prepare and give it. Use exactly as directed. Take it as directed on the prescription label at the same time every day. Keep taking it unless your care team tells you to stop. It is important that you put your used needles and syringes in a special sharps container. Do not put them in a trash can. If you do not have a sharps container, call your pharmacist or care team to get one. This medication comes with INSTRUCTIONS FOR USE. Ask your pharmacist for directions on how to use this medication. Read the information carefully. Talk to your pharmacist or care team if you have questions. Talk to your care team about the use of this  medication in children. While it may be prescribed for children for selected conditions, precautions do apply. Overdosage: If you think you have taken too much of this medicine contact a poison control center or emergency room at once. NOTE: This medicine is only for you. Do not share this medicine with others. What if I miss a dose? It is important not to miss any doses. Talk to your care team about what to do if you miss a dose. What may interact with this medication? Medications that may cause a release of neutrophils, such as lithium This list may not describe all possible interactions. Give your health care provider a list of all the medicines, herbs, non-prescription drugs, or dietary supplements you use. Also tell them if you smoke, drink alcohol, or use illegal drugs. Some items may interact with your medicine. What should I watch for while using this medication? Your condition will be monitored carefully while you are receiving this medication. You may need bloodwork while taking this medication. Talk to your care team about your risk of cancer. You may be more at risk for certain types of cancer if you take this medication. What side effects may I notice from receiving this medication? Side effects that you should report to your care team as soon as possible: Allergic reactions--skin rash, itching, hives, swelling of the face, lips, tongue, or throat Capillary leak syndrome--stomach or muscle pain, unusual weakness or fatigue, feeling faint or lightheaded, decrease in the amount of urine, swelling of the ankles, hands, or  feet, trouble breathing High white blood cell level--fever, fatigue, trouble breathing, night sweats, change in vision, weight loss Inflammation of the aorta--fever, fatigue, back, chest, or stomach pain, severe headache Kidney injury (glomerulonephritis)--decrease in the amount of urine, red or dark brown urine, foamy or bubbly urine, swelling of the ankles, hands, or  feet Shortness of breath or trouble breathing Spleen injury--pain in upper left stomach or shoulder Unusual bruising or bleeding Side effects that usually do not require medical attention (report to your care team if they continue or are bothersome): Back pain Bone pain Fatigue Fever Headache Nausea This list may not describe all possible side effects. Call your doctor for medical advice about side effects. You may report side effects to FDA at 1-800-FDA-1088. Where should I keep my medication? Keep out of the reach of children and pets. Keep this medication in the original packaging until you are ready to take it. Protect from light. See product for storage information. Each product may have different instructions. Get rid of any unused medication after the expiration date. To get rid of medications that are no longer needed or have expired: Take the medication to a medications take-back program. Check with your pharmacy or law enforcement to find a location. If you cannot return the medication, ask your pharmacist or care team how to get rid of this medication safely. NOTE: This sheet is a summary. It may not cover all possible information. If you have questions about this medicine, talk to your doctor, pharmacist, or health care provider.  2023 Elsevier/Gold Standard (2021-08-26 00:00:00)

## 2022-06-30 ENCOUNTER — Encounter: Payer: Self-pay | Admitting: Oncology

## 2022-07-02 DIAGNOSIS — I700 Atherosclerosis of aorta: Secondary | ICD-10-CM | POA: Diagnosis not present

## 2022-07-02 DIAGNOSIS — C189 Malignant neoplasm of colon, unspecified: Secondary | ICD-10-CM | POA: Diagnosis not present

## 2022-07-02 DIAGNOSIS — K760 Fatty (change of) liver, not elsewhere classified: Secondary | ICD-10-CM | POA: Diagnosis not present

## 2022-07-02 DIAGNOSIS — R918 Other nonspecific abnormal finding of lung field: Secondary | ICD-10-CM | POA: Diagnosis not present

## 2022-07-02 DIAGNOSIS — Z9889 Other specified postprocedural states: Secondary | ICD-10-CM | POA: Diagnosis not present

## 2022-07-02 LAB — CBC: RBC: 3.92 (ref 3.87–5.11)

## 2022-07-02 LAB — BASIC METABOLIC PANEL
BUN: 14 (ref 4–21)
CO2: 26 — AB (ref 13–22)
Chloride: 103 (ref 99–108)
Creatinine: 1.2 — AB (ref 0.5–1.1)
Glucose: 145
Potassium: 3.5 mEq/L (ref 3.5–5.1)
Sodium: 142 (ref 137–147)

## 2022-07-02 LAB — CBC AND DIFFERENTIAL
HCT: 38 (ref 36–46)
Hemoglobin: 12.5 (ref 12.0–16.0)
Neutrophils Absolute: 1.35
Platelets: 83 10*3/uL — AB (ref 150–400)
WBC: 2.6

## 2022-07-02 LAB — COMPREHENSIVE METABOLIC PANEL
Albumin: 4.1 (ref 3.5–5.0)
Calcium: 9 (ref 8.7–10.7)

## 2022-07-02 LAB — HEPATIC FUNCTION PANEL
ALT: 26 U/L (ref 7–35)
AST: 31 (ref 13–35)
Alkaline Phosphatase: 111 (ref 25–125)
Bilirubin, Total: 0.4

## 2022-07-03 ENCOUNTER — Inpatient Hospital Stay: Payer: 59 | Attending: Oncology | Admitting: Oncology

## 2022-07-03 ENCOUNTER — Encounter: Payer: Self-pay | Admitting: Oncology

## 2022-07-03 DIAGNOSIS — C189 Malignant neoplasm of colon, unspecified: Secondary | ICD-10-CM

## 2022-07-03 DIAGNOSIS — C182 Malignant neoplasm of ascending colon: Secondary | ICD-10-CM

## 2022-07-03 DIAGNOSIS — C7800 Secondary malignant neoplasm of unspecified lung: Secondary | ICD-10-CM | POA: Diagnosis not present

## 2022-07-03 DIAGNOSIS — C787 Secondary malignant neoplasm of liver and intrahepatic bile duct: Secondary | ICD-10-CM

## 2022-07-03 NOTE — Progress Notes (Signed)
[facility]  [address] [place],  [place]  [address] [phone]  Clinic Day:  07/03/2022  Referring physician: Greig Right, MD  HISTORY OF PRESENT ILLNESS:  The patient is a 57 y.o. female with  metastatic colon cancer, which includes spread of disease to her abdominal cavity, lungs, brain and right adrenal gland. She comes in today to go over her CT scans to ascertain her new disease baseline after receiving 40 cycles of palliative FOLFIRI/Avastin chemotherapy.  Of note, leucovorin has been discontinued due to an allergic reaction from this agent that did not improve despite being predmedicated with diphenhydramine and famotidine. The patient claims to have tolerated her 40h cycle of treatment okay.  She continues to report intermittent diarrhea, but uses Imodium as needed.  She denies having any new symptoms which concern her for possible disease progression.   With respect to her colon cancer history, she is status post a right hemicolectomy in early October 2018, followed by 12 cycles of adjuvant FOLFOX chemotherapy, which were completed in April 2019.  In December 2021, she underwent a left cerebellar metastasectomy, whose pathology was consistent with metastatic colon cancer.  Tumor testing did come back MMR normal. CT scans revealed evidence of her cancer being in multiple locations, for which she has been taking FOLFIRI/Avastin ever since for disease control.  PHYSICAL EXAM:  Blood pressure (!) 116/58, pulse 96, temperature 97.9 F (36.6 C), resp. rate 14, height '5\' 8"'$  (1.727 m), weight 153 lb 1.6 oz (69.4 kg), last menstrual period 02/22/2019, SpO2 97 %. Body mass index is 23.28 kg/m.  Performance status (ECOG): 1 - Symptomatic but completely ambulatory  Physical Exam Vitals and nursing note reviewed.  Constitutional:      General: She is not in acute distress.    Appearance: Normal appearance.  HENT:     Head: Normocephalic and  atraumatic.     Mouth/Throat:     Mouth: Mucous membranes are moist.     Pharynx: Oropharynx is clear. No oropharyngeal exudate or posterior oropharyngeal erythema.  Eyes:     General: No scleral icterus.    Extraocular Movements: Extraocular movements intact.     Conjunctiva/sclera: Conjunctivae normal.     Pupils: Pupils are equal, round, and reactive to light.  Cardiovascular:     Rate and Rhythm: Normal rate and regular rhythm.     Heart sounds: Normal heart sounds. No murmur heard.    No friction rub. No gallop.  Pulmonary:     Effort: Pulmonary effort is normal.     Breath sounds: Normal breath sounds. No wheezing, rhonchi or rales.  Abdominal:     General: There is no distension.     Palpations: Abdomen is soft. There is no hepatomegaly, splenomegaly or mass.     Tenderness: There is no abdominal tenderness.  Musculoskeletal:        General: Normal range of motion.     Cervical back: Normal range of motion and neck supple. No tenderness.     Right lower leg: No edema.     Left lower leg: No edema.  Lymphadenopathy:     Cervical: No cervical adenopathy.     Upper Body:     Right upper body: No supraclavicular or axillary adenopathy.     Left upper body: No supraclavicular or axillary adenopathy.     Lower Body: No right inguinal adenopathy. No left inguinal adenopathy.  Skin:    General: Skin is warm and dry.  Coloration: Skin is not jaundiced.     Findings: No rash.  Neurological:     Mental Status: She is alert and oriented to person, place, and time.     Cranial Nerves: No cranial nerve deficit.  Psychiatric:        Mood and Affect: Mood normal.        Behavior: Behavior normal.        Thought Content: Thought content normal.  SCANS:  CT scans of her chest/abdomen/pelvis revealed the following: FINDINGS: CT CHEST FINDINGS  Cardiovascular: Right chest port catheter. Aortic atherosclerosis. Normal heart size. No pericardial effusion.  Mediastinum/Nodes: No  enlarged mediastinal, hilar, or axillary lymph nodes. Thyroid gland, trachea, and esophagus demonstrate no significant findings.  Lungs/Pleura: Multiple bilateral pulmonary nodules of varying sizes are slightly enlarged compared to prior examination, for example in the peripheral right lower lobe measuring 1.8 x 1.5 cm, previously 1.5 x 1.3 cm (series 4, image 70) and in the posterior right upper lobe measuring 3.2 x 2.2 cm, previously 3.0 x 1.9 cm (series 4, image 65). Additional tiny cavitary nodule of the medial left apex measures 0.7 cm, previously 0.5 cm (series 4, image 23), and a tiny nodule in the anterior right upper lobe measures 0.4 cm, previously no greater than 0.2 cm (series 4, image 24). No pleural effusion or pneumothorax.  Musculoskeletal: No chest wall abnormality. No acute osseous findings.  CT ABDOMEN PELVIS FINDINGS  Hepatobiliary: Hepatic steatosis. New, or at least significantly enlarged hypodense lesion of the caudate measuring 1.4 x 1.1 cm (series 4, image 49). No gallstones, gallbladder wall thickening, or biliary dilatation.  Pancreas: Unremarkable. No pancreatic ductal dilatation or surrounding inflammatory changes.  Spleen: Normal in size without significant abnormality.  Adrenals/Urinary Tract: Adrenal glands are unremarkable. Kidneys are normal, without renal calculi, solid lesion, or hydronephrosis. Bladder is unremarkable.  Stomach/Bowel: Stomach is within normal limits. Status post partial right hemicolectomy and ileocolic reanastomosis. No evidence of bowel wall thickening, distention, or inflammatory changes.  Vascular/Lymphatic: Aortic atherosclerosis. No enlarged abdominal or pelvic lymph nodes.  Reproductive: Status post hysterectomy.  Other: No abdominal wall hernia or abnormality. No ascites.  Musculoskeletal: No acute osseous findings.  IMPRESSION: 1. Multiple bilateral pulmonary nodules of varying sizes are slightly enlarged  compared to prior examination, consistent with worsened pulmonary metastatic disease. 2. New, or at least significantly enlarged hypodense lesion of the caudate lobe, consistent with hepatic metastatic disease. 3. Status post partial right hemicolectomy and ileocolic reanastomosis. 4. Hepatic steatosis.  Aortic Atherosclerosis (ICD10-I70.0).  LABS:         Latest Ref Rng & Units 06/12/2022   12:00 AM 05/22/2022   12:00 AM 05/01/2022   11:15 AM  CBC  WBC  5.2     2.7     7.6   Hemoglobin 12.0 - 16.0 13.1     12.9     13.1   Hematocrit 36 - 46 40     39     42.3   Platelets 150 - 400 K/uL 96     110     130      This result is from an external source.      Latest Ref Rng & Units 06/12/2022    2:55 PM 05/22/2022    2:40 PM 05/01/2022   11:15 AM  CMP  Glucose 70 - 99 mg/dL 136  202  112   BUN 6 - 20 mg/dL '15  17  16   '$ Creatinine 0.44 - 1.00  mg/dL 1.55  1.75  1.43   Sodium 135 - 145 mmol/L 140  139  141   Potassium 3.5 - 5.1 mmol/L 4.3  4.1  4.0   Chloride 98 - 111 mmol/L 102  108  103   CO2 22 - 32 mmol/L '26  21  28   '$ Calcium 8.9 - 10.3 mg/dL 9.6  9.4  9.1   Total Protein 6.5 - 8.1 g/dL 7.3  7.0  7.0   Total Bilirubin 0.3 - 1.2 mg/dL 0.4  0.4  0.5   Alkaline Phos 38 - 126 U/L 125  108  104   AST 15 - 41 U/L '25  25  21   '$ ALT 0 - 44 U/L '26  23  15    '$ ASSESSMENT & PLAN:  Assessment/Plan:  A 57 y.o. female with metastatic colon cancer.  In clinic today, I went over all of her CT scan images with her, for which she could see she has a new liver lesion which represents her first ever evidence of metastatic liver disease.  Furthermore, her many of her pulmonary nodules have grown by just a few millimeters in size.  The patient understands that these findings represent disease progression to where a change in therapy is warranted.  With respect to her chemotherapy history, she was on FOLFOX in the adjuvant setting, as well as FOLFIRI/Avastin in the metastatic/palliative setting.   Theoretically, she has been on the best chemotherapy regimens for her metastatic colon cancer.  In clinic today, I will have her peripheral blood sent for a liquid biopsy to [employer] to see if her cancer harbors any type of mutation that would make her eligible for some form of targeted therapy.  Despite her multiple areas of disease metastasis, she still has a very good ECOG 1 performance status to where I will refer her to [facility] to discuss possible enrollment into any attractive clinic trials they currently have.  The patient understands there are oral agents available, such as fruquintinib, Lonsurf, and regorafenib, that could be used to treat her disease if her clinical trial eligibility is in question.  I will tentatively see her back in 2 weeks to go over her [employer] liquid biopsy results and their implications. The patient understands all the plans discussed today and is in agreement with them.    Aziya Arena Macarthur Critchley, MD

## 2022-07-07 ENCOUNTER — Encounter: Payer: 59 | Admitting: Dietician

## 2022-07-07 ENCOUNTER — Ambulatory Visit: Payer: 59

## 2022-07-07 ENCOUNTER — Encounter: Payer: Self-pay | Admitting: Oncology

## 2022-07-07 DIAGNOSIS — C182 Malignant neoplasm of ascending colon: Secondary | ICD-10-CM | POA: Diagnosis not present

## 2022-07-07 LAB — CEA: CEA: 14.4

## 2022-07-08 ENCOUNTER — Encounter: Payer: Self-pay | Admitting: Oncology

## 2022-07-08 DIAGNOSIS — C182 Malignant neoplasm of ascending colon: Secondary | ICD-10-CM | POA: Diagnosis not present

## 2022-07-14 ENCOUNTER — Ambulatory Visit: Payer: 59

## 2022-07-15 ENCOUNTER — Ambulatory Visit: Payer: 59

## 2022-07-15 ENCOUNTER — Ambulatory Visit: Payer: 59 | Admitting: Psychiatry

## 2022-07-16 ENCOUNTER — Encounter: Payer: Self-pay | Admitting: Oncology

## 2022-07-16 ENCOUNTER — Ambulatory Visit: Payer: 59

## 2022-07-16 NOTE — Progress Notes (Signed)
[facility]  [address] [place],  [place]  [address] [phone]  Clinic Day:  07/17/2022  Referring physician: Greig Right, MD  HISTORY OF PRESENT ILLNESS:  The patient is a 57 y.o. female with  metastatic colon cancer, which includes spread of disease to her abdominal cavity, lungs, brain and right adrenal gland. Recent scans also showed a new focus of liver metastasis.  She comes in today to go over her [employer] test results to determine if she may be eligible for targeted therapy.  Since her last visit, the patient has been doing fairly well.  Despite being off of chemotherapy for these past weeks, diarrhea remains an issue.   With respect to her colon cancer history, she is status post a right hemicolectomy in early October 2018, followed by 12 cycles of adjuvant FOLFOX chemotherapy, which were completed in April 2019.  In December 2021, she underwent a left cerebellar metastasectomy, whose pathology was consistent with metastatic colon cancer.  Tumor testing did come back MMR normal. CT scans revealed evidence of her cancer being in multiple locations, for which she took 40 cycles of FOLFIRI/Avastin.  Recent scans now show liver metastasis.      PHYSICAL EXAM:  Blood pressure 101/68, pulse (!) 103, temperature 99 F (37.2 C), resp. rate 16, height 5' 8"$  (1.727 m), weight 151 lb 11.2 oz (68.8 kg), last menstrual period 02/22/2019, SpO2 97 %. Body mass index is 23.07 kg/m.  Performance status (ECOG): 1 - Symptomatic but completely ambulatory  Physical Exam Vitals and nursing note reviewed.  Constitutional:      General: She is not in acute distress.    Appearance: Normal appearance.  HENT:     Head: Normocephalic and atraumatic.     Mouth/Throat:     Mouth: Mucous membranes are moist.     Pharynx: Oropharynx is clear. No oropharyngeal exudate or posterior oropharyngeal erythema.  Eyes:     General: No scleral icterus.    Extraocular  Movements: Extraocular movements intact.     Conjunctiva/sclera: Conjunctivae normal.     Pupils: Pupils are equal, round, and reactive to light.  Cardiovascular:     Rate and Rhythm: Normal rate and regular rhythm.     Heart sounds: Normal heart sounds. No murmur heard.    No friction rub. No gallop.  Pulmonary:     Effort: Pulmonary effort is normal.     Breath sounds: Normal breath sounds. No wheezing, rhonchi or rales.  Abdominal:     General: There is no distension.     Palpations: Abdomen is soft. There is no hepatomegaly, splenomegaly or mass.     Tenderness: There is no abdominal tenderness.  Musculoskeletal:        General: Normal range of motion.     Cervical back: Normal range of motion and neck supple. No tenderness.     Right lower leg: No edema.     Left lower leg: No edema.  Lymphadenopathy:     Cervical: No cervical adenopathy.     Upper Body:     Right upper body: No supraclavicular or axillary adenopathy.     Left upper body: No supraclavicular or axillary adenopathy.     Lower Body: No right inguinal adenopathy. No left inguinal adenopathy.  Skin:    General: Skin is warm and dry.     Coloration: Skin is not jaundiced.     Findings: No rash.  Neurological:     Mental Status: She  is alert and oriented to person, place, and time.     Cranial Nerves: No cranial nerve deficit.  Psychiatric:        Mood and Affect: Mood normal.        Behavior: Behavior normal.        Thought Content: Thought content normal.   PATHOLOGY:  [employer] unfortunately showed no actionable mutations to where targeted therapy would apply.  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   LABS:         Latest Ref Rng & Units 07/02/2022   12:00 AM 06/12/2022   12:00 AM 05/22/2022   12:00 AM  CBC  WBC  2.6     5.2     2.7      Hemoglobin 12.0 - 16.0 12.5     13.1     12.9      Hematocrit 36 - 46 38     40     39      Platelets 150 - 400 K/uL 83      96     110         This result is from an external source.      Latest Ref Rng & Units 07/02/2022   12:00 AM 06/12/2022    2:55 PM 05/22/2022    2:40 PM  CMP  Glucose 70 - 99 mg/dL  136  202   BUN 4 - 21 14     15  17   $ Creatinine 0.5 - 1.1 1.2     1.55  1.75   Sodium 137 - 147 142     140  139   Potassium 3.5 - 5.1 mEq/L 3.5     4.3  4.1   Chloride 99 - 108 103     102  108   CO2 13 - 22 26     26  21   $ Calcium 8.7 - 10.7 9.0     9.6  9.4   Total Protein 6.5 - 8.1 g/dL  7.3  7.0   Total Bilirubin 0.3 - 1.2 mg/dL  0.4  0.4   Alkaline Phos 25 - 125 111     125  108   AST 13 - 35 31     25  25   $ ALT 7 - 35 U/L 26     26  23      $ This result is from an external source.   ASSESSMENT & PLAN:  Assessment/Plan:  A 57 y.o. female with metastatic colon cancer, whose disease has recently progressed after being on FOFIRI/Avastin for 40 cycles.  In clinic today, I went over her [employer] results with her, which unfortunately did not show her disease would be sensitive to some form of targeted therapy.  She already knows there are oral agents that could be used next, including Lonsurf, regorafenib, and fruquintinib.  There is also the possibility of reintroducing oxaliplatin-based therapy, as it has been nearly 5 years since it was given.  However, due to her young age and overall good health, she is scheduled to be seen at [facility] next week to discuss the potential of clinical trial enrollment.  I want to see what they have to offer before discussing her next line of treatment.  I will tentatively see her back in 2 weeks, primarily to discuss what she wishes to do next for her palliative disease management.  The patient understands all the plans discussed  today and is in agreement with them.    Estella Malatesta Macarthur Critchley, MD

## 2022-07-17 ENCOUNTER — Inpatient Hospital Stay (HOSPITAL_BASED_OUTPATIENT_CLINIC_OR_DEPARTMENT_OTHER): Payer: 59 | Admitting: Oncology

## 2022-07-17 VITALS — BP 101/68 | HR 103 | Temp 99.0°F | Resp 16 | Ht 68.0 in | Wt 151.7 lb

## 2022-07-17 DIAGNOSIS — C184 Malignant neoplasm of transverse colon: Secondary | ICD-10-CM | POA: Diagnosis not present

## 2022-07-23 DIAGNOSIS — C189 Malignant neoplasm of colon, unspecified: Secondary | ICD-10-CM | POA: Diagnosis not present

## 2022-07-31 ENCOUNTER — Inpatient Hospital Stay: Payer: 59 | Admitting: Oncology

## 2022-08-02 NOTE — Progress Notes (Signed)
[facility]  [address] [place],  [place]  [address] [phone]  Clinic Day:  08/03/2022  Referring physician: Greig Right, MD  HISTORY OF PRESENT ILLNESS:  The patient is a 57 y.o. female with  metastatic colon cancer, which includes spread of disease to her abdominal cavity, lungs, brain and right adrenal gland. Recent scans also showed a new focus of liver metastasis.  She comes in today to touch base after receiving a second opinion at [facility].  She is being evaluated for a study that is evaluating DKN-01 in combination with standard of care chemotherapy in the metastatic setting.  She is scheduled for an upcoming liver biopsy, as well as repeat scans to ascertain her current disease burden.  Overall, the patient has been doing fairly well.  She does complain of a steady level of fatigue, but denies having other problems that particularly tie in with her metastatic colon cancer.    With respect to her colon cancer history, she is status post a right hemicolectomy in early October 2018, followed by 12 cycles of adjuvant FOLFOX chemotherapy, which were completed in April 2019.  In December 2021, she underwent a left cerebellar metastasectomy, whose pathology was consistent with metastatic colon cancer.  Tumor testing did come back MMR normal. CT scans revealed evidence of her cancer being in multiple locations, for which she took 40 cycles of FOLFIRI/Avastin.  Recent scans have just revealed liver metastasis.      PHYSICAL EXAM:  Blood pressure 122/81, pulse 92, temperature (!) 97.5 F (36.4 C), resp. rate 16, height '5\' 8"'$  (1.727 m), weight 150 lb 4.8 oz (68.2 kg), last menstrual period 02/22/2019, SpO2 98 %. Body mass index is 22.85 kg/m.  Performance status (ECOG): 1 - Symptomatic but completely ambulatory  Physical Exam Vitals and nursing note reviewed.  Constitutional:      General: She is not in acute distress.    Appearance: Normal appearance.   HENT:     Head: Normocephalic and atraumatic.     Mouth/Throat:     Mouth: Mucous membranes are moist.     Pharynx: Oropharynx is clear. No oropharyngeal exudate or posterior oropharyngeal erythema.  Eyes:     General: No scleral icterus.    Extraocular Movements: Extraocular movements intact.     Conjunctiva/sclera: Conjunctivae normal.     Pupils: Pupils are equal, round, and reactive to light.  Cardiovascular:     Rate and Rhythm: Normal rate and regular rhythm.     Heart sounds: Normal heart sounds. No murmur heard.    No friction rub. No gallop.  Pulmonary:     Effort: Pulmonary effort is normal.     Breath sounds: Normal breath sounds. No wheezing, rhonchi or rales.  Abdominal:     General: There is no distension.     Palpations: Abdomen is soft. There is no hepatomegaly, splenomegaly or mass.     Tenderness: There is no abdominal tenderness.  Musculoskeletal:        General: Normal range of motion.     Cervical back: Normal range of motion and neck supple. No tenderness.     Right lower leg: No edema.     Left lower leg: No edema.  Lymphadenopathy:     Cervical: No cervical adenopathy.     Upper Body:     Right upper body: No supraclavicular or axillary adenopathy.     Left upper body: No supraclavicular or axillary adenopathy.     Lower  Body: No right inguinal adenopathy. No left inguinal adenopathy.  Skin:    General: Skin is warm and dry.     Coloration: Skin is not jaundiced.     Findings: No rash.  Neurological:     Mental Status: She is alert and oriented to person, place, and time.     Cranial Nerves: No cranial nerve deficit.  Psychiatric:        Mood and Affect: Mood normal.        Behavior: Behavior normal.        Thought Content: Thought content normal.    PATHOLOGY:  [facility] unfortunately showed no actionable mutations to where targeted therapy would apply.  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   LABS:        Latest Ref Rng & Units 07/02/2022   12:00 AM 06/12/2022   12:00 AM 05/22/2022   12:00 AM  CBC  WBC  2.6     5.2     2.7      Hemoglobin 12.0 - 16.0 12.5     13.1     12.9      Hematocrit 36 - 46 38     40     39      Platelets 150 - 400 K/uL 83     96     110         This result is from an external source.      Latest Ref Rng & Units 07/02/2022   12:00 AM 06/12/2022    2:55 PM 05/22/2022    2:40 PM  CMP  Glucose 70 - 99 mg/dL  136  202   BUN 4 - '21 14     15  17   '$ Creatinine 0.5 - 1.1 1.2     1.55  1.75   Sodium 137 - 147 142     140  139   Potassium 3.5 - 5.1 mEq/L 3.5     4.3  4.1   Chloride 99 - 108 103     102  108   CO2 13 - '22 26     26  21   '$ Calcium 8.7 - 10.7 9.0     9.6  9.4   Total Protein 6.5 - 8.1 g/dL  7.3  7.0   Total Bilirubin 0.3 - 1.2 mg/dL  0.4  0.4   Alkaline Phos 25 - 125 111     125  108   AST 13 - 35 '31     25  25   '$ ALT 7 - 35 U/L '26     26  23      '$ This result is from an external source.   ASSESSMENT & PLAN:  Assessment/Plan:  A 57 y.o. female with metastatic colon cancer, whose disease has recently progressed after being on FOFIRI/Avastin for 40 cycles.  As mentioned previously, she is currently being evaluated at [facility] for potential enrollment into a clinical trial evaluating DKN-01.  The patient is somewhat optimistic about gettiing into this therapy.  Outside of this, she knows that standard of care therapy could be offered at our facility.  However, I do want to give her every opportunity of clinical trial enrollment before relegating her to standard care agents in the metastatic disease setting.  I will tentatively see this patient back in 1 month.  The patient knows to contact our office before then if she is deemed ineligible for  the clinical trial at [facility] to where some form of third-line therapy needs to be initiated as quickly as possible.    Paticia Moster Macarthur Critchley, MD

## 2022-08-02 NOTE — Progress Notes (Incomplete)
[facility]  [address] [place],  [place]  [address] [phone]  Clinic Day:  07/17/2022  Referring physician: Greig Right, MD  HISTORY OF PRESENT ILLNESS:  The patient is a 57 y.o. female with  metastatic colon cancer, which includes spread of disease to her abdominal cavity, lungs, brain and right adrenal gland. Recent scans also showed a new focus of liver metastasis.  She comes in today to go over her Guardant360 test results to determine if she may be eligible for targeted therapy.  Since her last visit, the patient has been doing fairly well.  Despite being off of chemotherapy for these past weeks, diarrhea remains an issue.   With respect to her colon cancer history, she is status post a right hemicolectomy in early October 2018, followed by 12 cycles of adjuvant FOLFOX chemotherapy, which were completed in April 2019.  In December 2021, she underwent a left cerebellar metastasectomy, whose pathology was consistent with metastatic colon cancer.  Tumor testing did come back MMR normal. CT scans revealed evidence of her cancer being in multiple locations, for which she took 40 cycles of FOLFIRI/Avastin.  Recent scans now show liver metastasis.      PHYSICAL EXAM:  Last menstrual period 02/22/2019. There is no height or weight on file to calculate BMI.  Performance status (ECOG): 1 - Symptomatic but completely ambulatory  Physical Exam Vitals and nursing note reviewed.  Constitutional:      General: She is not in acute distress.    Appearance: Normal appearance.  HENT:     Head: Normocephalic and atraumatic.     Mouth/Throat:     Mouth: Mucous membranes are moist.     Pharynx: Oropharynx is clear. No oropharyngeal exudate or posterior oropharyngeal erythema.  Eyes:     General: No scleral icterus.    Extraocular Movements: Extraocular movements intact.     Conjunctiva/sclera: Conjunctivae normal.     Pupils: Pupils are equal, round, and  reactive to light.  Cardiovascular:     Rate and Rhythm: Normal rate and regular rhythm.     Heart sounds: Normal heart sounds. No murmur heard.    No friction rub. No gallop.  Pulmonary:     Effort: Pulmonary effort is normal.     Breath sounds: Normal breath sounds. No wheezing, rhonchi or rales.  Abdominal:     General: There is no distension.     Palpations: Abdomen is soft. There is no hepatomegaly, splenomegaly or mass.     Tenderness: There is no abdominal tenderness.  Musculoskeletal:        General: Normal range of motion.     Cervical back: Normal range of motion and neck supple. No tenderness.     Right lower leg: No edema.     Left lower leg: No edema.  Lymphadenopathy:     Cervical: No cervical adenopathy.     Upper Body:     Right upper body: No supraclavicular or axillary adenopathy.     Left upper body: No supraclavicular or axillary adenopathy.     Lower Body: No right inguinal adenopathy. No left inguinal adenopathy.  Skin:    General: Skin is warm and dry.     Coloration: Skin is not jaundiced.     Findings: No rash.  Neurological:     Mental Status: She is alert and oriented to person, place, and time.     Cranial Nerves: No cranial nerve deficit.  Psychiatric:  Mood and Affect: Mood normal.        Behavior: Behavior normal.        Thought Content: Thought content normal.    PATHOLOGY:  Guardant360 unfortunately showed no actionable mutations to where targeted therapy would apply.  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   LABS:         Latest Ref Rng & Units 07/02/2022   12:00 AM 06/12/2022   12:00 AM 05/22/2022   12:00 AM  CBC  WBC  2.6     5.2     2.7      Hemoglobin 12.0 - 16.0 12.5     13.1     12.9      Hematocrit 36 - 46 38     40     39      Platelets 150 - 400 K/uL 83     96     110         This result is from an external source.       Latest Ref Rng & Units 07/02/2022   12:00 AM 06/12/2022     2:55 PM 05/22/2022    2:40 PM  CMP  Glucose 70 - 99 mg/dL  136  202   BUN 4 - '21 14     15  17   '$ Creatinine 0.5 - 1.1 1.2     1.55  1.75   Sodium 137 - 147 142     140  139   Potassium 3.5 - 5.1 mEq/L 3.5     4.3  4.1   Chloride 99 - 108 103     102  108   CO2 13 - '22 26     26  21   '$ Calcium 8.7 - 10.7 9.0     9.6  9.4   Total Protein 6.5 - 8.1 g/dL  7.3  7.0   Total Bilirubin 0.3 - 1.2 mg/dL  0.4  0.4   Alkaline Phos 25 - 125 111     125  108   AST 13 - 35 '31     25  25   '$ ALT 7 - 35 U/L '26     26  23      '$ This result is from an external source.    ASSESSMENT & PLAN:  Assessment/Plan:  A 57 y.o. female with metastatic colon cancer, whose disease has recently progressed after being on FOFIRI/Avastin for 40 cycles.  In clinic today, I went over her Guardant360 results with her, which unfortunately did not show her disease would be sensitive to some form of targeted therapy.  She already knows there are oral agents that could be used next, including Lonsurf, regorafenib, and fruquintinib.  There is also the possibility of reintroducing oxaliplatin-based therapy, as it has been nearly 5 years since it was given.  However, due to her young age and overall good health, she is scheduled to be seen at [facility] next week to discuss the potential of clinical trial enrollment.  I want to see what they have to offer before discussing her next line of treatment.  I will tentatively see her back in 2 weeks, primarily to discuss what she wishes to do next for her palliative disease management.  The patient understands all the plans discussed today and is in agreement with them.    Pollie Poma Macarthur Critchley, MD

## 2022-08-03 ENCOUNTER — Inpatient Hospital Stay: Payer: 59 | Attending: Oncology | Admitting: Oncology

## 2022-08-03 VITALS — BP 122/81 | HR 92 | Temp 97.5°F | Resp 16 | Ht 68.0 in | Wt 150.3 lb

## 2022-08-03 DIAGNOSIS — C183 Malignant neoplasm of hepatic flexure: Secondary | ICD-10-CM | POA: Diagnosis not present

## 2022-08-05 ENCOUNTER — Other Ambulatory Visit: Payer: Self-pay | Admitting: Psychiatry

## 2022-08-05 DIAGNOSIS — F333 Major depressive disorder, recurrent, severe with psychotic symptoms: Secondary | ICD-10-CM

## 2022-08-06 DIAGNOSIS — C787 Secondary malignant neoplasm of liver and intrahepatic bile duct: Secondary | ICD-10-CM | POA: Diagnosis not present

## 2022-08-06 DIAGNOSIS — K769 Liver disease, unspecified: Secondary | ICD-10-CM | POA: Diagnosis not present

## 2022-08-06 DIAGNOSIS — C189 Malignant neoplasm of colon, unspecified: Secondary | ICD-10-CM | POA: Diagnosis not present

## 2022-08-10 ENCOUNTER — Other Ambulatory Visit: Payer: Self-pay | Admitting: Psychiatry

## 2022-08-10 DIAGNOSIS — C7801 Secondary malignant neoplasm of right lung: Secondary | ICD-10-CM | POA: Diagnosis not present

## 2022-08-10 DIAGNOSIS — C189 Malignant neoplasm of colon, unspecified: Secondary | ICD-10-CM | POA: Diagnosis not present

## 2022-08-10 DIAGNOSIS — F422 Mixed obsessional thoughts and acts: Secondary | ICD-10-CM

## 2022-08-10 DIAGNOSIS — C787 Secondary malignant neoplasm of liver and intrahepatic bile duct: Secondary | ICD-10-CM | POA: Diagnosis not present

## 2022-08-10 NOTE — Telephone Encounter (Signed)
Please call to schedule appt. February visit was canceled by provider.

## 2022-08-10 NOTE — Telephone Encounter (Signed)
Lvm for patient to call and schedule 

## 2022-08-11 ENCOUNTER — Ambulatory Visit
Admission: RE | Admit: 2022-08-11 | Discharge: 2022-08-11 | Disposition: A | Discharge status: Home / Self Care | Payer: Self-pay | Source: Ambulatory Visit | Attending: Internal Medicine | Admitting: Internal Medicine

## 2022-08-11 ENCOUNTER — Other Ambulatory Visit: Payer: Self-pay | Admitting: Radiation Therapy

## 2022-08-11 DIAGNOSIS — E7800 Pure hypercholesterolemia, unspecified: Secondary | ICD-10-CM | POA: Diagnosis not present

## 2022-08-11 DIAGNOSIS — C7931 Secondary malignant neoplasm of brain: Secondary | ICD-10-CM | POA: Diagnosis not present

## 2022-08-11 DIAGNOSIS — Z6824 Body mass index (BMI) 24.0-24.9, adult: Secondary | ICD-10-CM | POA: Diagnosis not present

## 2022-08-11 DIAGNOSIS — E1169 Type 2 diabetes mellitus with other specified complication: Secondary | ICD-10-CM | POA: Diagnosis not present

## 2022-08-11 DIAGNOSIS — E039 Hypothyroidism, unspecified: Secondary | ICD-10-CM | POA: Diagnosis not present

## 2022-08-11 DIAGNOSIS — F321 Major depressive disorder, single episode, moderate: Secondary | ICD-10-CM | POA: Diagnosis not present

## 2022-08-24 DIAGNOSIS — C182 Malignant neoplasm of ascending colon: Secondary | ICD-10-CM | POA: Diagnosis not present

## 2022-08-24 DIAGNOSIS — C189 Malignant neoplasm of colon, unspecified: Secondary | ICD-10-CM | POA: Diagnosis not present

## 2022-08-25 ENCOUNTER — Telehealth: Payer: Self-pay | Admitting: Oncology

## 2022-08-25 NOTE — Telephone Encounter (Signed)
Patient has been scheduled. Aware of appt date and time    F/U APPT Received: Today Velora Heckler, CMA sent to [employer] Scheduling PLEASE CALL PATIENT AND PUT ON SCHED WITH [facility] FOR THURSDAY OR FRIDAY LABS AND F/U.  THANKS

## 2022-08-27 ENCOUNTER — Other Ambulatory Visit: Payer: 59

## 2022-08-27 ENCOUNTER — Ambulatory Visit: Payer: 59 | Admitting: Oncology

## 2022-08-29 ENCOUNTER — Other Ambulatory Visit: Payer: Self-pay | Admitting: Psychiatry

## 2022-08-29 DIAGNOSIS — F333 Major depressive disorder, recurrent, severe with psychotic symptoms: Secondary | ICD-10-CM

## 2022-08-30 NOTE — Telephone Encounter (Signed)
Please call to schedule an appt. February visit was canceled by provider.

## 2022-08-31 ENCOUNTER — Inpatient Hospital Stay: Payer: 59 | Attending: Oncology

## 2022-08-31 ENCOUNTER — Encounter: Payer: Self-pay | Admitting: Oncology

## 2022-08-31 ENCOUNTER — Inpatient Hospital Stay (HOSPITAL_BASED_OUTPATIENT_CLINIC_OR_DEPARTMENT_OTHER): Payer: 59 | Admitting: Oncology

## 2022-08-31 VITALS — BP 119/84 | HR 100 | Temp 99.0°F | Resp 16 | Ht 68.0 in | Wt 150.1 lb

## 2022-08-31 DIAGNOSIS — C7802 Secondary malignant neoplasm of left lung: Secondary | ICD-10-CM | POA: Insufficient documentation

## 2022-08-31 DIAGNOSIS — Z5111 Encounter for antineoplastic chemotherapy: Secondary | ICD-10-CM | POA: Insufficient documentation

## 2022-08-31 DIAGNOSIS — C7801 Secondary malignant neoplasm of right lung: Secondary | ICD-10-CM | POA: Insufficient documentation

## 2022-08-31 DIAGNOSIS — C7971 Secondary malignant neoplasm of right adrenal gland: Secondary | ICD-10-CM | POA: Diagnosis not present

## 2022-08-31 DIAGNOSIS — C787 Secondary malignant neoplasm of liver and intrahepatic bile duct: Secondary | ICD-10-CM | POA: Insufficient documentation

## 2022-08-31 DIAGNOSIS — C182 Malignant neoplasm of ascending colon: Secondary | ICD-10-CM

## 2022-08-31 DIAGNOSIS — C7931 Secondary malignant neoplasm of brain: Secondary | ICD-10-CM | POA: Insufficient documentation

## 2022-08-31 DIAGNOSIS — C189 Malignant neoplasm of colon, unspecified: Secondary | ICD-10-CM

## 2022-08-31 DIAGNOSIS — C786 Secondary malignant neoplasm of retroperitoneum and peritoneum: Secondary | ICD-10-CM | POA: Diagnosis not present

## 2022-08-31 DIAGNOSIS — C7800 Secondary malignant neoplasm of unspecified lung: Secondary | ICD-10-CM | POA: Diagnosis not present

## 2022-08-31 LAB — CMP (CANCER CENTER ONLY)
ALT: 18 U/L (ref 0–44)
AST: 21 U/L (ref 15–41)
Albumin: 4.1 g/dL (ref 3.5–5.0)
Alkaline Phosphatase: 105 U/L (ref 38–126)
Anion gap: 10 (ref 5–15)
BUN: 12 mg/dL (ref 6–20)
CO2: 27 mmol/L (ref 22–32)
Calcium: 9.6 mg/dL (ref 8.9–10.3)
Chloride: 103 mmol/L (ref 98–111)
Creatinine: 1.23 mg/dL — ABNORMAL HIGH (ref 0.44–1.00)
GFR, Estimated: 52 mL/min — ABNORMAL LOW (ref >60)
Glucose, Bld: 90 mg/dL (ref 70–99)
Potassium: 4.3 mmol/L (ref 3.5–5.1)
Sodium: 140 mmol/L (ref 135–145)
Total Bilirubin: 0.5 mg/dL (ref 0.3–1.2)
Total Protein: 7 g/dL (ref 6.5–8.1)

## 2022-08-31 LAB — CBC W DIFFERENTIAL (~~LOC~~ CC SCANNED REPORT)

## 2022-08-31 LAB — CBC: RBC: 4.36 (ref 3.87–5.11)

## 2022-08-31 LAB — CBC AND DIFFERENTIAL
HCT: 41 (ref 36–46)
Hemoglobin: 13.5 (ref 12.0–16.0)
Neutrophils Absolute: 2.42
Platelets: 128 10*3/uL — AB (ref 150–400)
WBC: 4.1

## 2022-08-31 LAB — TOTAL PROTEIN, URINE DIPSTICK: Protein, ur: NEGATIVE mg/dL

## 2022-08-31 NOTE — Telephone Encounter (Signed)
Pt is scheduled 5/9

## 2022-08-31 NOTE — Progress Notes (Signed)
[facility]  [address] [place],  [place]  [address] [phone]  Clinic Day:  08/31/2022  Referring physician: Greig Right, MD  HISTORY OF PRESENT ILLNESS:  The patient is a 57 y.o. female with  metastatic colon cancer, which includes spread of disease to her abdominal cavity, lungs, brain and right adrenal gland. Recent scans also showed a new focus of liver metastasis.  She comes in today to get back on some form of standard chemotherapy.  The patient was to be enrolled in the phase 3 clinical trial at [facility] that was implementing standard chemotherapy +/- DKN-01 in the metastatic setting.  As she was randomized to the placebo arm, the patient was not enamored with not getting the DKN-01 agent to where she took herself of the study.  She comes into clinic today doing fairly well.  She denies having any new GI symptoms which concern her for overt progression of her metastatic colon cancer.  Of note, recent CT scans at [facility] did show her to now have 3 metastatic liver lesions.    With respect to her colon cancer history, she is status post a right hemicolectomy in early October 2018, followed by 12 cycles of adjuvant FOLFOX chemotherapy, which were completed in April 2019.  In December 2021, she underwent a left cerebellar metastasectomy, whose pathology was consistent with metastatic colon cancer.  Tumor testing did come back MMR normal. CT scans revealed evidence of her cancer being in multiple locations, for which she took 40 cycles of FOLFIRI/Avastin.  Recent scans have just revealed liver metastasis.      PHYSICAL EXAM:  Blood pressure 119/84, pulse 100, temperature 99 F (37.2 C), resp. rate 16, height 5\' 8"  (1.727 m), weight 150 lb 1.6 oz (68.1 kg), last menstrual period 02/22/2019, SpO2 98 %. Body mass index is 22.82 kg/m.  Performance status (ECOG): 1 - Symptomatic but completely ambulatory  Physical Exam Vitals and nursing note  reviewed.  Constitutional:      General: She is not in acute distress.    Appearance: Normal appearance.  HENT:     Head: Normocephalic and atraumatic.     Mouth/Throat:     Mouth: Mucous membranes are moist.     Pharynx: Oropharynx is clear. No oropharyngeal exudate or posterior oropharyngeal erythema.  Eyes:     General: No scleral icterus.    Extraocular Movements: Extraocular movements intact.     Conjunctiva/sclera: Conjunctivae normal.     Pupils: Pupils are equal, round, and reactive to light.  Cardiovascular:     Rate and Rhythm: Normal rate and regular rhythm.     Heart sounds: Normal heart sounds. No murmur heard.    No friction rub. No gallop.  Pulmonary:     Effort: Pulmonary effort is normal.     Breath sounds: Normal breath sounds. No wheezing, rhonchi or rales.  Abdominal:     General: There is no distension.     Palpations: Abdomen is soft. There is no hepatomegaly, splenomegaly or mass.     Tenderness: There is no abdominal tenderness.  Musculoskeletal:        General: Normal range of motion.     Cervical back: Normal range of motion and neck supple. No tenderness.     Right lower leg: No edema.     Left lower leg: No edema.  Lymphadenopathy:     Cervical: No cervical adenopathy.     Upper Body:     Right upper  body: No supraclavicular or axillary adenopathy.     Left upper body: No supraclavicular or axillary adenopathy.     Lower Body: No right inguinal adenopathy. No left inguinal adenopathy.  Skin:    General: Skin is warm and dry.     Coloration: Skin is not jaundiced.     Findings: No rash.  Neurological:     Mental Status: She is alert and oriented to person, place, and time.     Cranial Nerves: No cranial nerve deficit.  Psychiatric:        Mood and Affect: Mood normal.        Behavior: Behavior normal.        Thought Content: Thought content normal.   PATHOLOGY:  Guardant360 unfortunately showed no actionable mutations to where targeted  therapy would apply.  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   LABS:       Latest Ref Rng & Units 08/31/2022   12:00 AM 07/02/2022   12:00 AM 06/12/2022   12:00 AM  CBC  WBC  4.1     2.6     5.2      Hemoglobin 12.0 - 16.0 13.5     12.5     13.1      Hematocrit 36 - 46 41     38     40      Platelets 150 - 400 K/uL 128     83     96         This result is from an external source.      Latest Ref Rng & Units 08/31/2022    3:06 PM 07/02/2022   12:00 AM 06/12/2022    2:55 PM  CMP  Glucose 70 - 99 mg/dL 90   136   BUN 6 - 20 mg/dL 12  14     15    Creatinine 0.44 - 1.00 mg/dL 1.23  1.2     1.55   Sodium 135 - 145 mmol/L 140  142     140   Potassium 3.5 - 5.1 mmol/L 4.3  3.5     4.3   Chloride 98 - 111 mmol/L 103  103     102   CO2 22 - 32 mmol/L 27  26     26    Calcium 8.9 - 10.3 mg/dL 9.6  9.0     9.6   Total Protein 6.5 - 8.1 g/dL 7.0   7.3   Total Bilirubin 0.3 - 1.2 mg/dL 0.5   0.4   Alkaline Phos 38 - 126 U/L 105  111     125   AST 15 - 41 U/L 21  31     25    ALT 0 - 44 U/L 18  26     26       This result is from an external source.   ASSESSMENT & PLAN:  Assessment/Plan:  A 57 y.o. female with metastatic colon cancer, whose disease has recently progressed after being on FOFIRI/Avastin for 40 cycles.  As mentioned previously, the patient took herself off of the clinical trial at [facility] due to her not receiving the agent DKN-01 in the phase III clinical trial.  Based upon this, I will place the patient on FOLFOX/Avastin.  Of note, she took FOLFOX in the adjuvant setting 5 years ago.  As she is NRAS positive, EGFR inhibitors cannot be used, thus the reason for adding Avastin.  Her 1st cycle of FOLFOX/Avastin will be given on Wednesday, April 3rd.  She is already aware of the side effects that can go along with this regimen, including cold-induced neuropathy, cytopenias, and hypertension.  I will see her back in 2 weeks before she heads into  her 2nd cycle of FOLFOX/Avastin.  The patient understands all the plans discussed today and is in agreement with them.  Jazmine Longshore Macarthur Critchley, MD

## 2022-09-01 ENCOUNTER — Encounter: Payer: Self-pay | Admitting: Oncology

## 2022-09-01 ENCOUNTER — Other Ambulatory Visit: Payer: Self-pay

## 2022-09-01 MED FILL — Fluorouracil IV Soln 5 GM/100ML (50 MG/ML): INTRAVENOUS | Qty: 87 | Status: AC

## 2022-09-01 MED FILL — Dexamethasone Sodium Phosphate Inj 100 MG/10ML: INTRAMUSCULAR | Qty: 1 | Status: AC

## 2022-09-01 MED FILL — Fosaprepitant Dimeglumine For IV Infusion 150 MG (Base Eq): INTRAVENOUS | Qty: 5 | Status: AC

## 2022-09-01 MED FILL — Fluorouracil IV Soln 2.5 GM/50ML (50 MG/ML): INTRAVENOUS | Qty: 14 | Status: AC

## 2022-09-02 ENCOUNTER — Inpatient Hospital Stay: Payer: 59

## 2022-09-02 VITALS — BP 140/81 | HR 95 | Temp 97.7°F | Resp 20 | Ht 68.0 in | Wt 151.0 lb

## 2022-09-02 DIAGNOSIS — C189 Malignant neoplasm of colon, unspecified: Secondary | ICD-10-CM | POA: Diagnosis not present

## 2022-09-02 DIAGNOSIS — C7802 Secondary malignant neoplasm of left lung: Secondary | ICD-10-CM | POA: Diagnosis not present

## 2022-09-02 DIAGNOSIS — C787 Secondary malignant neoplasm of liver and intrahepatic bile duct: Secondary | ICD-10-CM | POA: Diagnosis not present

## 2022-09-02 DIAGNOSIS — Z5111 Encounter for antineoplastic chemotherapy: Secondary | ICD-10-CM | POA: Diagnosis not present

## 2022-09-02 DIAGNOSIS — C182 Malignant neoplasm of ascending colon: Secondary | ICD-10-CM

## 2022-09-02 DIAGNOSIS — C7971 Secondary malignant neoplasm of right adrenal gland: Secondary | ICD-10-CM | POA: Diagnosis not present

## 2022-09-02 DIAGNOSIS — C7931 Secondary malignant neoplasm of brain: Secondary | ICD-10-CM | POA: Diagnosis not present

## 2022-09-02 DIAGNOSIS — C7801 Secondary malignant neoplasm of right lung: Secondary | ICD-10-CM | POA: Diagnosis not present

## 2022-09-02 DIAGNOSIS — C786 Secondary malignant neoplasm of retroperitoneum and peritoneum: Secondary | ICD-10-CM | POA: Diagnosis not present

## 2022-09-02 MED ORDER — DEXTROSE 5 % IV SOLN: Freq: Once | INTRAVENOUS | Status: AC

## 2022-09-02 MED ORDER — PALONOSETRON HCL INJECTION 0.25 MG/5ML
0.2500 mg | Freq: Once | INTRAVENOUS | Status: AC
  Administered 2022-09-02: 0.25 mg via INTRAVENOUS
  Filled 2022-09-02: qty 5

## 2022-09-02 MED ORDER — SODIUM CHLORIDE 0.9 % IV SOLN
5.0000 mg/kg | Freq: Once | INTRAVENOUS | Status: AC
  Administered 2022-09-02: 350 mg via INTRAVENOUS
  Filled 2022-09-02: qty 14

## 2022-09-02 MED ORDER — SODIUM CHLORIDE 0.9 % IV SOLN
2400.0000 mg/m2 | INTRAVENOUS | Status: DC
  Administered 2022-09-02: 4350 mg via INTRAVENOUS
  Filled 2022-09-02: qty 87

## 2022-09-02 MED ORDER — SODIUM CHLORIDE 0.9 % IV SOLN: Freq: Once | INTRAVENOUS | Status: AC

## 2022-09-02 MED ORDER — SODIUM CHLORIDE 0.9 % IV SOLN
10.0000 mg | Freq: Once | INTRAVENOUS | Status: AC
  Administered 2022-09-02: 10 mg via INTRAVENOUS
  Filled 2022-09-02: qty 10

## 2022-09-02 MED ORDER — FAMOTIDINE IN NACL 20-0.9 MG/50ML-% IV SOLN
20.0000 mg | Freq: Once | INTRAVENOUS | Status: AC
  Administered 2022-09-02: 20 mg via INTRAVENOUS
  Filled 2022-09-02: qty 50

## 2022-09-02 MED ORDER — FLUOROURACIL CHEMO INJECTION 2.5 GM/50ML
400.0000 mg/m2 | Freq: Once | INTRAVENOUS | Status: AC
  Administered 2022-09-02: 700 mg via INTRAVENOUS
  Filled 2022-09-02: qty 14

## 2022-09-02 MED ORDER — SODIUM CHLORIDE 0.9 % IV SOLN
150.0000 mg | Freq: Once | INTRAVENOUS | Status: AC
  Administered 2022-09-02: 150 mg via INTRAVENOUS
  Filled 2022-09-02: qty 150

## 2022-09-02 MED ORDER — DIPHENHYDRAMINE HCL 50 MG/ML IJ SOLN
25.0000 mg | Freq: Once | INTRAMUSCULAR | Status: AC
  Administered 2022-09-02: 25 mg via INTRAVENOUS
  Filled 2022-09-02: qty 1

## 2022-09-02 MED ORDER — ONDANSETRON 4 MG PO TBDP
4.0000 mg | ORAL_TABLET | ORAL | 3 refills | Status: AC | PRN
Start: 2022-09-02 — End: ?

## 2022-09-02 MED ORDER — OXALIPLATIN CHEMO INJECTION 100 MG/20ML
85.0000 mg/m2 | Freq: Once | INTRAVENOUS | Status: AC
  Administered 2022-09-02: 150 mg via INTRAVENOUS
  Filled 2022-09-02: qty 20

## 2022-09-02 NOTE — Patient Instructions (Signed)
Bevacizumab Injection What is this medication? BEVACIZUMAB (be va SIZ yoo mab) treats some types of cancer. It works by blocking a protein that causes cancer cells to grow and multiply. This helps to slow or stop the spread of cancer cells. It is a monoclonal antibody. This medicine may be used for other purposes; ask your health care provider or pharmacist if you have questions. COMMON BRAND NAME(S): Alymsys, Avastin, MVASI, Zirabev What should I tell my care team before I take this medication? They need to know if you have any of these conditions: Blood clots Coughing up blood Having or recent surgery Heart failure High blood pressure History of a connection between 2 or more body parts that do not usually connect (fistula) History of a tear in your stomach or intestines Protein in your urine An unusual or allergic reaction to bevacizumab, other medications, foods, dyes, or preservatives Pregnant or trying to get pregnant Breast-feeding How should I use this medication? This medication is injected into a vein. It is given by your care team in a hospital or clinic setting. Talk to your care team the use of this medication in children. Special care may be needed. Overdosage: If you think you have taken too much of this medicine contact a poison control center or emergency room at once. NOTE: This medicine is only for you. Do not share this medicine with others. What if I miss a dose? Keep appointments for follow-up doses. It is important not to miss your dose. Call your care team if you are unable to keep an appointment. What may interact with this medication? Interactions are not expected. This list may not describe all possible interactions. Give your health care provider a list of all the medicines, herbs, non-prescription drugs, or dietary supplements you use. Also tell them if you smoke, drink alcohol, or use illegal drugs. Some items may interact with your medicine. What should I  watch for while using this medication? Your condition will be monitored carefully while you are receiving this medication. You may need blood work while taking this medication. This medication may make you feel generally unwell. This is not uncommon as chemotherapy can affect healthy cells as well as cancer cells. Report any side effects. Continue your course of treatment even though you feel ill unless your care team tells you to stop. This medication may increase your risk to bruise or bleed. Call your care team if you notice any unusual bleeding. Before having surgery, talk to your care team to make sure it is ok. This medication can increase the risk of poor healing of your surgical site or wound. You will need to stop this medication for 28 days before surgery. After surgery, wait at least 28 days before restarting this medication. Make sure the surgical site or wound is healed enough before restarting this medication. Talk to your care team if questions. Talk to your care team if you may be pregnant. Serious birth defects can occur if you take this medication during pregnancy and for 6 months after the last dose. Contraception is recommended while taking this medication and for 6 months after the last dose. Your care team can help you find the option that works for you. Do not breastfeed while taking this medication and for 6 months after the last dose. This medication can cause infertility. Talk to your care team if you are concerned about your fertility. What side effects may I notice from receiving this medication? Side effects that you should report   to your care team as soon as possible: Allergic reactions--skin rash, itching, hives, swelling of the face, lips, tongue, or throat Bleeding--bloody or black, tar-like stools, vomiting blood or brown material that looks like coffee grounds, red or dark brown urine, small red or purple spots on skin, unusual bruising or bleeding Blood clot--pain,  swelling, or warmth in the leg, shortness of breath, chest pain Heart attack--pain or tightness in the chest, shoulders, arms, or jaw, nausea, shortness of breath, cold or clammy skin, feeling faint or lightheaded Heart failure--shortness of breath, swelling of the ankles, feet, or hands, sudden weight gain, unusual weakness or fatigue Increase in blood pressure Infection--fever, chills, cough, sore throat, wounds that don't heal, pain or trouble when passing urine, general feeling of discomfort or being unwell Infusion reactions--chest pain, shortness of breath or trouble breathing, feeling faint or lightheaded Kidney injury--decrease in the amount of urine, swelling of the ankles, hands, or feet Stomach pain that is severe, does not go away, or gets worse Stroke--sudden numbness or weakness of the face, arm, or leg, trouble speaking, confusion, trouble walking, loss of balance or coordination, dizziness, severe headache, change in vision Sudden and severe headache, confusion, change in vision, seizures, which may be signs of posterior reversible encephalopathy syndrome (PRES) Side effects that usually do not require medical attention (report to your care team if they continue or are bothersome): Back pain Change in taste Diarrhea Dry skin Increased tears Nosebleed This list may not describe all possible side effects. Call your doctor for medical advice about side effects. You may report side effects to FDA at 1-800-FDA-1088. Where should I keep my medication? This medication is given in a hospital or clinic. It will not be stored at home. NOTE: This sheet is a summary. It may not cover all possible information. If you have questions about this medicine, talk to your doctor, pharmacist, or health care provider.  2023 Else[employer]/Gold Standard (2021-09-19 00:00:00) Oxaliplatin Injection What is this medication? OXALIPLATIN (ox AL i PLA tin) treats colorectal cancer. It works by slowing down the  growth of cancer cells. This medicine may be used for other purposes; ask your health care provider or pharmacist if you have questions. COMMON BRAND NAME(S): Eloxatin What should I tell my care team before I take this medication? They need to know if you have any of these conditions: Heart disease History of irregular heartbeat or rhythm Liver disease Low blood cell levels (white cells, red cells, and platelets) Lung or breathing disease, such as asthma Take medications that treat or prevent blood clots Tingling of the fingers, toes, or other nerve disorder An unusual or allergic reaction to oxaliplatin, other medications, foods, dyes, or preservatives If you or your partner are pregnant or trying to get pregnant Breast-feeding How should I use this medication? This medication is injected into a vein. It is given by your care team in a hospital or clinic setting. Talk to your care team about the use of this medication in children. Special care may be needed. Overdosage: If you think you have taken too much of this medicine contact a poison control center or emergency room at once. NOTE: This medicine is only for you. Do not share this medicine with others. What if I miss a dose? Keep appointments for follow-up doses. It is important not to miss a dose. Call your care team if you are unable to keep an appointment. What may interact with this medication? Do not take this medication with any of the  following: Cisapride Dronedarone Pimozide Thioridazine This medication may also interact with the following: Aspirin and aspirin-like medications Certain medications that treat or prevent blood clots, such as warfarin, apixaban, dabigatran, and rivaroxaban Cisplatin Cyclosporine Diuretics Medications for infection, such as acyclovir, adefovir, amphotericin B, bacitracin, cidofovir, foscarnet, ganciclovir, gentamicin, pentamidine, vancomycin NSAIDs, medications for pain and inflammation,  such as ibuprofen or naproxen Other medications that cause heart rhythm changes Pamidronate Zoledronic acid This list may not describe all possible interactions. Give your health care provider a list of all the medicines, herbs, non-prescription drugs, or dietary supplements you use. Also tell them if you smoke, drink alcohol, or use illegal drugs. Some items may interact with your medicine. What should I watch for while using this medication? Your condition will be monitored carefully while you are receiving this medication. You may need blood work while taking this medication. This medication may make you feel generally unwell. This is not uncommon as chemotherapy can affect healthy cells as well as cancer cells. Report any side effects. Continue your course of treatment even though you feel ill unless your care team tells you to stop. This medication may increase your risk of getting an infection. Call your care team for advice if you get a fever, chills, sore throat, or other symptoms of a cold or flu. Do not treat yourself. Try to avoid being around people who are sick. Avoid taking medications that contain aspirin, acetaminophen, ibuprofen, naproxen, or ketoprofen unless instructed by your care team. These medications may hide a fever. Be careful brushing or flossing your teeth or using a toothpick because you may get an infection or bleed more easily. If you have any dental work done, tell your dentist you are receiving this medication. This medication can make you more sensitive to cold. Do not drink cold drinks or use ice. Cover exposed skin before coming in contact with cold temperatures or cold objects. When out in cold weather wear warm clothing and cover your mouth and nose to warm the air that goes into your lungs. Tell your care team if you get sensitive to the cold. Talk to your care team if you or your partner are pregnant or think either of you might be pregnant. This medication can  cause serious birth defects if taken during pregnancy and for 9 months after the last dose. A negative pregnancy test is required before starting this medication. A reliable form of contraception is recommended while taking this medication and for 9 months after the last dose. Talk to your care team about effective forms of contraception. Do not father a child while taking this medication and for 6 months after the last dose. Use a condom while having sex during this time period. Do not breastfeed while taking this medication and for 3 months after the last dose. This medication may cause infertility. Talk to your care team if you are concerned about your fertility. What side effects may I notice from receiving this medication? Side effects that you should report to your care team as soon as possible: Allergic reactions--skin rash, itching, hives, swelling of the face, lips, tongue, or throat Bleeding--bloody or black, tar-like stools, vomiting blood or brown material that looks like coffee grounds, red or dark brown urine, small red or purple spots on skin, unusual bruising or bleeding Dry cough, shortness of breath or trouble breathing Heart rhythm changes--fast or irregular heartbeat, dizziness, feeling faint or lightheaded, chest pain, trouble breathing Infection--fever, chills, cough, sore throat, wounds that don't heal,  pain or trouble when passing urine, general feeling of discomfort or being unwell Liver injury--right upper belly pain, loss of appetite, nausea, light-colored stool, dark yellow or brown urine, yellowing skin or eyes, unusual weakness or fatigue Low red blood cell level--unusual weakness or fatigue, dizziness, headache, trouble breathing Muscle injury--unusual weakness or fatigue, muscle pain, dark yellow or brown urine, decrease in amount of urine Pain, tingling, or numbness in the hands or feet Sudden and severe headache, confusion, change in vision, seizures, which may be  signs of posterior reversible encephalopathy syndrome (PRES) Unusual bruising or bleeding Side effects that usually do not require medical attention (report to your care team if they continue or are bothersome): Diarrhea Nausea Pain, redness, or swelling with sores inside the mouth or throat Unusual weakness or fatigue Vomiting This list may not describe all possible side effects. Call your doctor for medical advice about side effects. You may report side effects to FDA at 1-800-FDA-1088. Where should I keep my medication? This medication is given in a hospital or clinic. It will not be stored at home. NOTE: This sheet is a summary. It may not cover all possible information. If you have questions about this medicine, talk to your doctor, pharmacist, or health care provider.  2023 Elsevier/Gold Standard (2007-07-09 00:00:00) Fluorouracil Injection What is this medication? FLUOROURACIL (flure oh YOOR a sil) treats some types of cancer. It works by slowing down the growth of cancer cells. This medicine may be used for other purposes; ask your health care provider or pharmacist if you have questions. COMMON BRAND NAME(S): Adrucil What should I tell my care team before I take this medication? They need to know if you have any of these conditions: Blood disorders Dihydropyrimidine dehydrogenase (DPD) deficiency Infection, such as chickenpox, cold sores, herpes Kidney disease Liver disease Poor nutrition Recent or ongoing radiation therapy An unusual or allergic reaction to fluorouracil, other medications, foods, dyes, or preservatives If you or your partner are pregnant or trying to get pregnant Breast-feeding How should I use this medication? This medication is injected into a vein. It is administered by your care team in a hospital or clinic setting. Talk to your care team about the use of this medication in children. Special care may be needed. Overdosage: If you think you have taken  too much of this medicine contact a poison control center or emergency room at once. NOTE: This medicine is only for you. Do not share this medicine with others. What if I miss a dose? Keep appointments for follow-up doses. It is important not to miss your dose. Call your care team if you are unable to keep an appointment. What may interact with this medication? Do not take this medication with any of the following: Live virus vaccines This medication may also interact with the following: Medications that treat or prevent blood clots, such as warfarin, enoxaparin, dalteparin This list may not describe all possible interactions. Give your health care provider a list of all the medicines, herbs, non-prescription drugs, or dietary supplements you use. Also tell them if you smoke, drink alcohol, or use illegal drugs. Some items may interact with your medicine. What should I watch for while using this medication? Your condition will be monitored carefully while you are receiving this medication. This medication may make you feel generally unwell. This is not uncommon as chemotherapy can affect healthy cells as well as cancer cells. Report any side effects. Continue your course of treatment even though you feel ill unless  your care team tells you to stop. In some cases, you may be given additional medications to help with side effects. Follow all directions for their use. This medication may increase your risk of getting an infection. Call your care team for advice if you get a fever, chills, sore throat, or other symptoms of a cold or flu. Do not treat yourself. Try to avoid being around people who are sick. This medication may increase your risk to bruise or bleed. Call your care team if you notice any unusual bleeding. Be careful brushing or flossing your teeth or using a toothpick because you may get an infection or bleed more easily. If you have any dental work done, tell your dentist you are  receiving this medication. Avoid taking medications that contain aspirin, acetaminophen, ibuprofen, naproxen, or ketoprofen unless instructed by your care team. These medications may hide a fever. Do not treat diarrhea with over the counter products. Contact your care team if you have diarrhea that lasts more than 2 days or if it is severe and watery. This medication can make you more sensitive to the sun. Keep out of the sun. If you cannot avoid being in the sun, wear protective clothing and sunscreen. Do not use sun lamps, tanning beds, or tanning booths. Talk to your care team if you or your partner wish to become pregnant or think you might be pregnant. This medication can cause serious birth defects if taken during pregnancy and for 3 months after the last dose. A reliable form of contraception is recommended while taking this medication and for 3 months after the last dose. Talk to your care team about effective forms of contraception. Do not father a child while taking this medication and for 3 months after the last dose. Use a condom while having sex during this time period. Do not breastfeed while taking this medication. This medication may cause infertility. Talk to your care team if you are concerned about your fertility. What side effects may I notice from receiving this medication? Side effects that you should report to your care team as soon as possible: Allergic reactions--skin rash, itching, hives, swelling of the face, lips, tongue, or throat Heart attack--pain or tightness in the chest, shoulders, arms, or jaw, nausea, shortness of breath, cold or clammy skin, feeling faint or lightheaded Heart failure--shortness of breath, swelling of the ankles, feet, or hands, sudden weight gain, unusual weakness or fatigue Heart rhythm changes--fast or irregular heartbeat, dizziness, feeling faint or lightheaded, chest pain, trouble breathing High ammonia level--unusual weakness or fatigue,  confusion, loss of appetite, nausea, vomiting, seizures Infection--fever, chills, cough, sore throat, wounds that don't heal, pain or trouble when passing urine, general feeling of discomfort or being unwell Low red blood cell level--unusual weakness or fatigue, dizziness, headache, trouble breathing Pain, tingling, or numbness in the hands or feet, muscle weakness, change in vision, confusion or trouble speaking, loss of balance or coordination, trouble walking, seizures Redness, swelling, and blistering of the skin over hands and feet Severe or prolonged diarrhea Unusual bruising or bleeding Side effects that usually do not require medical attention (report to your care team if they continue or are bothersome): Dry skin Headache Increased tears Nausea Pain, redness, or swelling with sores inside the mouth or throat Sensitivity to light Vomiting This list may not describe all possible side effects. Call your doctor for medical advice about side effects. You may report side effects to FDA at 1-800-FDA-1088. Where should I keep my medication? This  medication is given in a hospital or clinic. It will not be stored at home. NOTE: This sheet is a summary. It may not cover all possible information. If you have questions about this medicine, talk to your doctor, pharmacist, or health care provider.  2023 [employer]/Gold Standard (2021-09-16 00:00:00)

## 2022-09-03 ENCOUNTER — Other Ambulatory Visit: Payer: Self-pay | Admitting: Psychiatry

## 2022-09-03 ENCOUNTER — Other Ambulatory Visit: Payer: Self-pay

## 2022-09-03 DIAGNOSIS — F422 Mixed obsessional thoughts and acts: Secondary | ICD-10-CM

## 2022-09-04 ENCOUNTER — Ambulatory Visit: Payer: 59 | Admitting: Oncology

## 2022-09-04 ENCOUNTER — Inpatient Hospital Stay: Payer: 59

## 2022-09-04 VITALS — BP 102/74 | HR 99 | Temp 97.5°F | Resp 14 | Ht 68.0 in | Wt 150.0 lb

## 2022-09-04 DIAGNOSIS — Z5111 Encounter for antineoplastic chemotherapy: Secondary | ICD-10-CM | POA: Diagnosis not present

## 2022-09-04 DIAGNOSIS — C189 Malignant neoplasm of colon, unspecified: Secondary | ICD-10-CM | POA: Diagnosis not present

## 2022-09-04 DIAGNOSIS — C182 Malignant neoplasm of ascending colon: Secondary | ICD-10-CM

## 2022-09-04 DIAGNOSIS — C7801 Secondary malignant neoplasm of right lung: Secondary | ICD-10-CM | POA: Diagnosis not present

## 2022-09-04 DIAGNOSIS — C787 Secondary malignant neoplasm of liver and intrahepatic bile duct: Secondary | ICD-10-CM | POA: Diagnosis not present

## 2022-09-04 DIAGNOSIS — C7971 Secondary malignant neoplasm of right adrenal gland: Secondary | ICD-10-CM | POA: Diagnosis not present

## 2022-09-04 DIAGNOSIS — C7802 Secondary malignant neoplasm of left lung: Secondary | ICD-10-CM | POA: Diagnosis not present

## 2022-09-04 DIAGNOSIS — C7931 Secondary malignant neoplasm of brain: Secondary | ICD-10-CM | POA: Diagnosis not present

## 2022-09-04 DIAGNOSIS — C786 Secondary malignant neoplasm of retroperitoneum and peritoneum: Secondary | ICD-10-CM | POA: Diagnosis not present

## 2022-09-04 MED ORDER — HEPARIN SOD (PORK) LOCK FLUSH 100 UNIT/ML IV SOLN
500.0000 [IU] | Freq: Once | INTRAVENOUS | Status: AC | PRN
  Administered 2022-09-04: 500 [IU]

## 2022-09-04 MED ORDER — SODIUM CHLORIDE 0.9% FLUSH
10.0000 mL | INTRAVENOUS | Status: DC | PRN
  Administered 2022-09-04: 10 mL

## 2022-09-04 NOTE — Patient Instructions (Signed)
Managing Chemotherapy Side Effects, Adult Chemotherapy is a treatment that uses medicine to kill cancer cells. However, in addition to killing cancer cells, the medicines can also damage healthy cells. The damage to healthy cells can lead to side effects. The exact side effects depend on the specific medicines used. Most of the side effects of chemotherapy go away once treatment is finished. Until then, work closely with your health care providers and take an active role in managing your side effects. What are common side effects of chemotherapy? Increased risk of infection, bruising, or bleeding. Nausea and vomiting. Constipation or diarrhea. Loss of appetite. Hair loss. Mouth or throat sores. Tiredness (fatigue). Tingling, pain, or numbness in the hands and feet. Dry, sensitive, itchy, or sore skin. Sleep disturbances, such as excessive sleepiness. Confusion, anxiety, or mood swings. Memory changes. How to manage the side effects of chemotherapy Medicines Take over-the-counter and prescription medicines only as told by your health care provider. Talk with your health care provider before taking vitamins, herbs, supplements, or over-the-counter medicines. Some of these can interfere with chemotherapy. Activity Get plenty of rest. Get regular exercise by doing activities such as walking, gentle yoga, or tai chi. Return to your normal activities as told by your health care provider. Ask your health care provider what activities are safe for you. Eating and drinking  Talk to a dietitian about what you should eat and drink during cancer treatment. Drink enough fluid to keep your urine pale yellow. If you have side effects that affect eating, these tips may help: Eat smaller meals and snacks often. Drink high-nutrition and high-calorie shakes or supplements. Choose bland and soft foods that are easy to eat. Do not eat foods that are hot, spicy, or hard to swallow. Do not eat raw or  undercooked meat, eggs, or seafood. Always wash fresh fruits and vegetables well before eating them. Skin care If you have sore or itchy skin: Wear soft, comfortable clothing. Apply creams and ointments to your skin as told by your health care provider. If you lose your hair, consider wearing a wig, hat, or scarf to cover your head. You may want to have someone shave your head as you start to lose hair. During outdoor activities, protect your head and skin from the sun by using sunscreen with an SPF of 30 or higher or by wearing protective clothing and a hat. Meet with a hair and skin care specialist for makeup and skin care tips. Apply sunscreen to your scalp as told by your health care provider. General tips Learn as much as you can about your condition. If you are struggling emotionally, talk with a mental health care provider or join a support group. Keep all follow-up visits. This is important. How to prevent infection and bleeding Chemotherapy may lower your blood counts and put you at risk for infection and bleeding. Here are some ways to help prevent problems. Vaccines Talk to your health care provider about vaccines. You should not get any live vaccines, such as the polio, MMR, chickenpox, and shingles vaccines until your health care provider says that it is safe to do so. Do not be around people who have had live vaccines for as long as your health care provider recommends. Make sure you get a yearly flu shot. People who will be near you should also get a yearly flu shot. Social activity Stay away from crowded places where you could be exposed to germs. Do not be around people who may be sick or   people who have symptoms of a fever until they have been fever-free for at least 24 hours. Do not share food, cups, straws, or utensils with other people. Wear a mask when outside the home if your blood counts are low. Cleanliness  Wash your hands often for at least 20 seconds. Also make  sure that other members of your household wash their hands often. Brush your teeth twice daily using a soft toothbrush. Use mouth rinse only as told by your health care provider. Take a bath or shower daily unless your health care provider gives different instructions. General tips Take your temperature regularly, especially if you have chills or feel warm. Check with your health care provider: Before you travel. Before you have a dental procedure. Before you use a swimming pool, hot tub, or swim in a lake or ocean. If you get chemotherapy through an IV or port, check the site every day for signs of infection. Check for redness, swelling, pain, fluid, and warmth. Avoid activities that put you at risk for injury or bleeding. Use an electric razor to shave instead of a blade. Questions to ask your health care provider What are the most common side effects of my treatment? How will they affect my daily life? What can I do to manage them? What are some possible long-term side effects? What are possible complications? What support services are available? What number can I call with questions or concerns? Where to find support Cancer affects the entire family. Find out what family support resources are available from your cancer treatment center. For more support, turn to: Your cancer care team. Friends and family. Your religious community. Other people with cancer. Community-based or online support groups. Where to find more information National Cancer Institute: www.cancer.gov American Cancer Society: www.cancer.org Contact a health care provider if: You bleed or bruise more often. You notice blood in your urine or stool. You have any of these symptoms: A skin rash, or dry or itchy skin. A headache or stiff neck. Cold or flu symptoms. A cough. Persistent nausea or vomiting. Persistent diarrhea. Frequent urination, burning when passing urine, or foul-smelling urine. You cannot eat  because of mouth or throat pain. You are sad, confused, anxious, or depressed. Get help right away if: You have any of these symptoms: A fever or chills. Your health care provider should know about this right away. Redness, swelling, pain, fluid, or warmth near an IV site. Bleeding that you cannot stop. A seizure. You cannot swallow. You have chest pain. You have trouble breathing. A family member or caregiver should get help right away if you have a sudden or unusual change in behavior. These symptoms may be an emergency. Get help right away. Call 911. Do not wait to see if the symptoms will go away. Do not drive yourself to the hospital. Summary Chemotherapy is a treatment that uses medicine to kill cancer cells and can cause side effects. The specific side effects depend on the specific medicines used. Learn as much as you can about your condition. Ask about side effects to watch for and how to treat them. Seek out support and resources from others. Find out what family support resources are available from your cancer treatment center. Let your health care provider know if you notice any new, unusual, or worsening symptoms, especially fever or chills. This information is not intended to replace advice given to you by your health care provider. Make sure you discuss any questions you have with your health   care provider. Document Revised: 05/08/2021 Document Reviewed: 05/08/2021 Elsevier Patient Education  2023 Elsevier Inc.  

## 2022-09-07 ENCOUNTER — Inpatient Hospital Stay: Payer: 59

## 2022-09-07 ENCOUNTER — Other Ambulatory Visit: Payer: Self-pay | Admitting: Psychiatry

## 2022-09-07 DIAGNOSIS — F333 Major depressive disorder, recurrent, severe with psychotic symptoms: Secondary | ICD-10-CM

## 2022-09-08 ENCOUNTER — Other Ambulatory Visit: Payer: Self-pay

## 2022-09-08 ENCOUNTER — Inpatient Hospital Stay (HOSPITAL_BASED_OUTPATIENT_CLINIC_OR_DEPARTMENT_OTHER): Payer: 59 | Admitting: Internal Medicine

## 2022-09-08 VITALS — BP 110/89 | HR 98 | Temp 97.7°F | Resp 20 | Wt 147.1 lb

## 2022-09-08 DIAGNOSIS — C786 Secondary malignant neoplasm of retroperitoneum and peritoneum: Secondary | ICD-10-CM | POA: Diagnosis not present

## 2022-09-08 DIAGNOSIS — C7931 Secondary malignant neoplasm of brain: Secondary | ICD-10-CM | POA: Diagnosis not present

## 2022-09-08 DIAGNOSIS — C7801 Secondary malignant neoplasm of right lung: Secondary | ICD-10-CM | POA: Diagnosis not present

## 2022-09-08 DIAGNOSIS — C189 Malignant neoplasm of colon, unspecified: Secondary | ICD-10-CM | POA: Diagnosis not present

## 2022-09-08 DIAGNOSIS — C7802 Secondary malignant neoplasm of left lung: Secondary | ICD-10-CM | POA: Diagnosis not present

## 2022-09-08 DIAGNOSIS — C787 Secondary malignant neoplasm of liver and intrahepatic bile duct: Secondary | ICD-10-CM | POA: Diagnosis not present

## 2022-09-08 DIAGNOSIS — C7971 Secondary malignant neoplasm of right adrenal gland: Secondary | ICD-10-CM | POA: Diagnosis not present

## 2022-09-08 DIAGNOSIS — Z5111 Encounter for antineoplastic chemotherapy: Secondary | ICD-10-CM | POA: Diagnosis not present

## 2022-09-08 NOTE — Progress Notes (Signed)
[facility] [address]  [place], [place] [address] [phone]   Interval Evaluation  Date of Service: 09/08/22 Patient Name: Shannon [name] Patient MRN: [id number] Patient DOB: 02/11/1966 Provider: Henreitta LeberZachary K Ryan Ogborn, MD  Identifying Statement:  Shannon [name] is a 57 y.o. 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 Wynetta Emeryram.  Path c/w colon adenocarcinoma 07/08/20: Completes fractionated SRS to resection cavity w/ Dr. Basilio CairoSquire 10/22/20: Salvage SRS R cerebellum  Interval History:  Shannon [name] presents today for follow up after recent MRI brain.  No new or progressive deficits.  Fatigue is still present. FOLFOX was resumed last week after she opted out of [facility] study.  Balance is still "not great" but still walking on her own, maintains full independence.  She continues to have issues with sleep, brain fog.  No other changes to medications.  H+P (07/02/20) Patient presented to medical attention at the end of December, 2021 with several weeks of progressive balance impairment.  She worsened toward the end to the point of needing full assist with walking, or being mostly bed-bound.  At the same time she developed bouts of vertigo, described as "room spinning around me", for which medical interventions were not effective.  CNS imaging demonstrated large enhancing mass in left cerebellar hemisphere with swelling and compression c/w likely brain metastasis; she underwent craniotomy and resection on 05/29/20 with Dr. Wynetta Emeryram.  Following surgery, her symptoms improved considerably.  At present, she is functionally intact aside from issues with short term memory, fatigue, and mood.  Visited with radiation oncology today for post-operative simulation.   Medications: Current Outpatient Medications on  File Prior to Visit  Medication Sig Dispense Refill   fluvoxaMINE (LUVOX) 100 MG tablet TAKE 3 TABLETS (300 MG TOTAL) BY MOUTH AT BEDTIME 270 tablet 0   acetaminophen (TYLENOL) 325 MG tablet Take 2 tablets (650 mg total) by mouth every 6 (six) hours as needed for mild pain.     atorvastatin (LIPITOR) 10 MG tablet Take by mouth.     azithromycin (ZITHROMAX) 250 MG tablet Take 2 tablets by mouth on day 1, then one tablet daily on days 2 through 5 6 each 0   buPROPion (WELLBUTRIN XL) 150 MG 24 hr tablet TAKE 3 TABLETS BY MOUTH EVERY MORNING 180 tablet 0   cariprazine (VRAYLAR) 3 MG capsule Take 1 capsule (3 mg total) by mouth daily. (Patient taking differently: Take 3 mg by mouth daily. Every other day) 30 capsule 1   CONTOUR NEXT TEST test strip 3 (three) times daily.     Dulaglutide (TRULICITY) 1.5 MG/0.5ML SOPN Inject into the skin.     HUMALOG KWIKPEN 100 UNIT/ML KwikPen Inject into the skin.     hyoscyamine (LEVSIN SL) 0.125 MG SL tablet Place 1 tablet (0.125 mg total) under the tongue every 6 (six) hours as needed. 45 tablet 1   insulin aspart (NOVOLOG FLEXPEN) 100 UNIT/ML FlexPen Inject 8 Units into the skin 3 (three) times daily with meals. 15 mL 11   insulin glargine (LANTUS) 100 UNIT/ML Solostar Pen Inject 26 Units into the skin daily. 15 mL 11   lamoTRIgine (LAMICTAL) 150 MG tablet TAKE 1 TABLET BY MOUTH EVERY DAY 30 tablet 1   levothyroxine (SYNTHROID) 75 MCG tablet Take 1 tablet (75 mcg total) by mouth daily. 30 tablet 0   loratadine (CLARITIN)  10 MG tablet Take 10 mg by mouth daily.     LORazepam (ATIVAN) 0.5 MG tablet Take 1 tablet (0.5 mg total) by mouth every 8 (eight) hours as needed for anxiety. 30 tablet 3   metFORMIN (GLUCOPHAGE) 500 MG tablet Take 1 tablet (500 mg total) by mouth 2 (two) times daily with a meal. 60 tablet 0   modafinil (PROVIGIL) 200 MG tablet TAKE 1 & 1/2 (ONE & ONE-HALF) TABLETS BY MOUTH ONCE DAILY 45 tablet 3   Multiple Vitamin (MULTI-VITAMIN) tablet Take 1  tablet by mouth daily.     NOVOFINE PEN NEEDLE 32G X 6 MM MISC SMARTSIG:Syringe(s) SUB-Q     Omega-3 1000 MG CAPS Take by mouth.     ondansetron (ZOFRAN) 4 MG tablet Take 1 tablet (4 mg total) by mouth every 4 (four) hours as needed for nausea. 90 tablet 3   ondansetron (ZOFRAN-ODT) 4 MG disintegrating tablet Take 1 tablet (4 mg total) by mouth every 4 (four) hours as needed for nausea or vomiting. Dissolve on tongue 90 tablet 3   pantoprazole (PROTONIX) 40 MG tablet TAKE 1 TABLET BY MOUTH EVERYDAY AT BEDTIME 30 tablet 0   potassium chloride SA (KLOR-CON M) 20 MEQ tablet Take 1 tablet (20 mEq total) by mouth daily. 5 tablet 0   potassium chloride SA (KLOR-CON M) 20 MEQ tablet Take 1 tablet (20 mEq total) by mouth daily for 5 days. 5 tablet 0   prochlorperazine (COMPAZINE) 10 MG tablet TAKE 1 TABLET BY MOUTH EVERY 6 HOURS AS NEEDED FOR NAUSEA OR VOMITING. 90 tablet 3   QUEtiapine (SEROQUEL) 25 MG tablet TAKE 1 TO 2 TABLETS BY MOUTH NIGHTLY 180 tablet 0   senna-docusate (SENOKOT-S) 8.6-50 MG tablet Take 2 tablets by mouth at bedtime.     TRULICITY 3 MG/0.5ML SOPN Inject into the skin.     zolpidem (AMBIEN) 5 MG tablet Take 1 tablet (5 mg total) by mouth at bedtime as needed for sleep. 30 tablet 3   No current facility-administered medications on file prior to visit.    Allergies:  Allergies  Allergen Reactions   Leucovorin Hives, Rash and Other (See Comments)    Redness on face and neck.  Confirmed that it was the leucovorin causing this on 11/19/21.  We are deleting this from future plan.   Clindamycin/Lincomycin Rash   Irinotecan Rash   Penicillins Rash    Reaction: 10 years   Past Medical History:  Past Medical History:  Diagnosis Date   Anemia    Iron De   Anxiety    Blood clot in vein    Bowel obstruction (HCC)    Colon cancer (HCC) 2018   treated with surgery and chemotherapy   Depression    Diabetes mellitus without complication (HCC)    Type II   DVT (deep venous  thrombosis) (HCC) 2019   behind right knee- while she wasa on chemo   GERD (gastroesophageal reflux disease)    History of blood transfusion    History of chemotherapy    History of kidney stones 2012   passed   Hypothyroidism    Obesity    Past Surgical History:  Past Surgical History:  Procedure Laterality Date   ABDOMINAL HYSTERECTOMY  05/2019   total laparoscopin with tubes and ovariers removed also.   APPENDECTOMY  05/2019   COLON SURGERY  03/2017   Colon resection   COLONOSCOPY  2019   SUBOCCIPITAL CRANIECTOMY CERVICAL LAMINECTOMY Left 05/29/2020   Procedure: Suboccipital Craniectomy  for Resection of Left Cerebellar Mass;  Surgeon: Donalee Citrin, MD;  Location: [facility] OR;  Service: Neurosurgery;  Laterality: Left;   Social History:  Social History   Socioeconomic History   Marital status: Widowed    Spouse name: Not on file   Number of children: Not on file   Years of education: Not on file   Highest education level: Not on file  Occupational History   Occupation: care coordinator   Tobacco Use   Smoking status: Never   Smokeless tobacco: Never  Vaping Use   Vaping Use: Never used  Substance and Sexual Activity   Alcohol use: Yes    Comment: rare   Drug use: Never   Sexual activity: Not Currently  Other Topics Concern   Not on file  Social History Narrative   Not on file   Social Determinants of Health   Financial Resource Strain: Not on file  Food Insecurity: Not on file  Transportation Needs: Not on file  Physical Activity: Not on file  Stress: Not on file  Social Connections: Not on file  Intimate Partner Violence: Not on file   Family History:  Family History  Problem Relation Age of Onset   Irritable bowel syndrome Mother    Colon polyps Father    Breast cancer Maternal Grandmother    Diabetes Maternal Grandmother    Diabetes Maternal Grandfather    Breast cancer Paternal Grandmother    Colon polyps Maternal Uncle    Stomach cancer Neg Hx     Pancreatic cancer Neg Hx    Esophageal cancer Neg Hx    Colon cancer Neg Hx     Review of Systems: Constitutional: +Fatigue Eyes: Doesn't report blurriness of vision Ears, nose, mouth, throat, and face: Doesn't report sore throat Respiratory: Doesn't report cough, dyspnea or wheezes Cardiovascular: Doesn't report palpitation, chest discomfort  Gastrointestinal:  Doesn't report nausea, constipation, diarrhea GU: Doesn't report incontinence Skin: Doesn't report skin rashes Neurological: Per HPI Musculoskeletal: Doesn't report joint pain Behavioral/Psych: +mood issues, depression  Physical Exam: Vitals:   09/08/22 1230  BP: 110/89  Pulse: 98  Resp: 20  Temp: 97.7 F (36.5 C)  SpO2: 98%    KPS: 90. General: Alert, cooperative, pleasant, in no acute distress Head: Normal EENT: No conjunctival injection or scleral icterus.  Lungs: Resp effort normal Cardiac: Regular rate Abdomen: Non-distended abdomen Skin: No rashes cyanosis or petechiae. Extremities: No clubbing or edema  Neurologic Exam: Mental Status: Awake, alert, attentive to examiner. Oriented to self and environment. Language is fluent with intact comprehension.  Cranial Nerves: Visual acuity is grossly normal. Visual fields are full. Extra-ocular movements intact. No ptosis. Face is symmetric Motor: Tone and bulk are normal. Power is full in both arms and legs. Reflexes are symmetric, no pathologic reflexes present.  Sensory: Intact to light touch Gait: Normal.   Labs: I have reviewed the data as listed    Component Value Date/Time   NA 140 08/31/2022 1506   NA 142 07/02/2022 0000   K 4.3 08/31/2022 1506   CL 103 08/31/2022 1506   CO2 27 08/31/2022 1506   GLUCOSE 90 08/31/2022 1506   BUN 12 08/31/2022 1506   BUN 14 07/02/2022 0000   CREATININE 1.23 (H) 08/31/2022 1506   CALCIUM 9.6 08/31/2022 1506   PROT 7.0 08/31/2022 1506   ALBUMIN 4.1 08/31/2022 1506   AST 21 08/31/2022 1506   ALT 18 08/31/2022  1506   ALKPHOS 105 08/31/2022 1506   BILITOT 0.5  08/31/2022 1506   GFRNONAA 52 (L) 08/31/2022 1506   Lab Results  Component Value Date   WBC 4.1 08/31/2022   NEUTROABS 2.42 08/31/2022   HGB 13.5 08/31/2022   HCT 41 08/31/2022   MCV 101.4 (H) 05/01/2022   PLT 128 (A) 08/31/2022   Imaging:  [facility] Clinician Interpretation: I have personally reviewed the CNS images as listed.  My interpretation, in the context of the patient's clinical presentation, is stable disease  MRI BRAIN WITHOUT AND WITH CONTRAST   INDICATION: Colon cancer, stage IV, monitor, History of colon cancer and  brain met s/p resection. Needs follow up MRI, C18.9 Malignant neoplasm of  colon, unspecified (CMS-HCC), Z00.6 Encounter for examination for normal  comparison and control in clinical research program   COMPARISON: 03/06/2022 MRI brain reference   TECHNIQUE/PROTOCOL: Adult brain tumor protocol without and with IV  contrast.   FINDINGS:  Postoperative changes of left cerebellar craniotomy with subjacent  resection cavity redemonstrated. Surrounding increased signal on T2  weighted imaging is unchanged. No new or nodular contrast enhancement.  Unchanged appearance of a 4 mm focus of contrast enhancement in the right  cerebellum (16:10).   Other Brain Parenchyma:  No hemorrhage or acute cortical infarction.  No  midline shift.  Foci of periventricular white matter T2/FLAIR signal  abnormality are nonspecific but can be seen in chronic microvascular  ischemic disease. Cerebral atrophy advanced for age.  Ventricles: No hydrocephalus.  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  Basal Cisterns: The basal cisterns are maintained.  Intracranial Flow-Voids: Normal.   Paranasal Sinuses: Mild maxillary and ethmoid mucosal wall thickening.  Orbits are unremarkable.  Mastoid air cells: Normal.  Orbits: Normal.    IMPRESSION:  Status post left cerebellar craniotomy with an unchanged 4 mm enhancing  right cerebellar lesion. No evidence of new or progressive metastatic  disease.    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    No new or progressive CNS changes.  We recommended she review her psych regimen with her provider, given potential association with insomnia with Lamictal and Modafinil.    We appreciate the opportunity to participate in the care of Shannon [name].  She will continue to follow with Dr. Melvyn Neth for systemic therapy, has resuemd FOLFox+avastin as of 09/02/22.  We ask that Shannon [name] return to clinic in 5 months with brain MRI, or sooner as needed.  All questions were answered. The patient knows to call the clinic with any problems, questions or concerns. No barriers to learning were detected.  The total time spent in the encounter was 30 minutes and more than 50% was on counseling and review of test results   Henreitta Leber, MD Medical Director of Neuro-Oncology [facility] 09/08/22 12:31 PM

## 2022-09-11 ENCOUNTER — Other Ambulatory Visit: Payer: Self-pay

## 2022-09-14 ENCOUNTER — Inpatient Hospital Stay (HOSPITAL_BASED_OUTPATIENT_CLINIC_OR_DEPARTMENT_OTHER): Payer: 59 | Admitting: Oncology

## 2022-09-14 ENCOUNTER — Inpatient Hospital Stay: Payer: 59

## 2022-09-14 DIAGNOSIS — C189 Malignant neoplasm of colon, unspecified: Secondary | ICD-10-CM

## 2022-09-14 DIAGNOSIS — C7931 Secondary malignant neoplasm of brain: Secondary | ICD-10-CM | POA: Diagnosis not present

## 2022-09-14 DIAGNOSIS — C7800 Secondary malignant neoplasm of unspecified lung: Secondary | ICD-10-CM | POA: Diagnosis not present

## 2022-09-14 DIAGNOSIS — C7802 Secondary malignant neoplasm of left lung: Secondary | ICD-10-CM | POA: Diagnosis not present

## 2022-09-14 DIAGNOSIS — C786 Secondary malignant neoplasm of retroperitoneum and peritoneum: Secondary | ICD-10-CM | POA: Diagnosis not present

## 2022-09-14 DIAGNOSIS — C182 Malignant neoplasm of ascending colon: Secondary | ICD-10-CM | POA: Diagnosis not present

## 2022-09-14 DIAGNOSIS — Z5111 Encounter for antineoplastic chemotherapy: Secondary | ICD-10-CM | POA: Diagnosis not present

## 2022-09-14 DIAGNOSIS — C787 Secondary malignant neoplasm of liver and intrahepatic bile duct: Secondary | ICD-10-CM | POA: Diagnosis not present

## 2022-09-14 DIAGNOSIS — C7801 Secondary malignant neoplasm of right lung: Secondary | ICD-10-CM | POA: Diagnosis not present

## 2022-09-14 DIAGNOSIS — C7971 Secondary malignant neoplasm of right adrenal gland: Secondary | ICD-10-CM | POA: Diagnosis not present

## 2022-09-14 LAB — CMP (CANCER CENTER ONLY)
ALT: 18 U/L (ref 0–44)
AST: 22 U/L (ref 15–41)
Albumin: 3.9 g/dL (ref 3.5–5.0)
Alkaline Phosphatase: 98 U/L (ref 38–126)
Anion gap: 10 (ref 5–15)
BUN: 19 mg/dL (ref 6–20)
CO2: 24 mmol/L (ref 22–32)
Calcium: 9.3 mg/dL (ref 8.9–10.3)
Chloride: 105 mmol/L (ref 98–111)
Creatinine: 1.46 mg/dL — ABNORMAL HIGH (ref 0.44–1.00)
GFR, Estimated: 43 mL/min — ABNORMAL LOW (ref >60)
Glucose, Bld: 141 mg/dL — ABNORMAL HIGH (ref 70–99)
Potassium: 3.9 mmol/L (ref 3.5–5.1)
Sodium: 139 mmol/L (ref 135–145)
Total Bilirubin: 0.6 mg/dL (ref 0.3–1.2)
Total Protein: 7 g/dL (ref 6.5–8.1)

## 2022-09-14 LAB — CBC AND DIFFERENTIAL
HCT: 39 (ref 36–46)
Hemoglobin: 13 (ref 12.0–16.0)
Neutrophils Absolute: 2.5
Platelets: 118 10*3/uL — AB (ref 150–400)
WBC: 4.8

## 2022-09-14 LAB — CBC W DIFFERENTIAL (~~LOC~~ CC SCANNED REPORT)

## 2022-09-14 LAB — CBC: RBC: 4.21 (ref 3.87–5.11)

## 2022-09-14 NOTE — Progress Notes (Signed)
[facility]  [address] [place],  [place]  [address] [phone]  Clinic Day:  09/14/2022  Referring physician: Alinda Deem, MD  HISTORY OF PRESENT ILLNESS:  The patient is a 57 y.o. female with  metastatic colon cancer, which includes spread of disease to her abdominal cavity, lungs, brain and right adrenal gland. Recent CT scans at [facility] showed 3 liver metastases.   She comes in today to be evaluated before heading into her 2nd cycle of FOLFOX/Avastin.  The patient claims to have tolerated her first cycle of his chemotherapy regimen fairly well.  She only had a few days of nausea and occasional episodes of vomiting.  Otherwise, her daily quality of life has been good.  She denies having any new symptoms or findings which concern her for overt signs of disease progression.   With respect to her colon cancer history, she is status post a right hemicolectomy in early October 2018, followed by 12 cycles of adjuvant FOLFOX chemotherapy, which were completed in April 2019.  In December 2021, she underwent a left cerebellar metastasectomy, whose pathology was consistent with metastatic colon cancer.  Tumor testing did come back MMR normal. CT scans revealed evidence of her cancer being in multiple locations, for which she took 40 cycles of FOLFIRI/Avastin.  Recent scans have just revealed liver metastasis, please have her back on FOLFOX/Avastin.     PHYSICAL EXAM:  Blood pressure 123/79, pulse (!) 104, temperature 99.2 F (37.3 C), resp. rate 16, height 5\' 8"  (1.727 m), weight 150 lb 6.4 oz (68.2 kg), last menstrual period 02/22/2019, SpO2 98 %. Body mass index is 22.87 kg/m.  Performance status (ECOG): 1 - Symptomatic but completely ambulatory  Physical Exam Vitals and nursing note reviewed.  Constitutional:      General: She is not in acute distress.    Appearance: Normal appearance.  HENT:     Head: Normocephalic and atraumatic.     Mouth/Throat:      Mouth: Mucous membranes are moist.     Pharynx: Oropharynx is clear. No oropharyngeal exudate or posterior oropharyngeal erythema.  Eyes:     General: No scleral icterus.    Extraocular Movements: Extraocular movements intact.     Conjunctiva/sclera: Conjunctivae normal.     Pupils: Pupils are equal, round, and reactive to light.  Cardiovascular:     Rate and Rhythm: Normal rate and regular rhythm.     Heart sounds: Normal heart sounds. No murmur heard.    No friction rub. No gallop.  Pulmonary:     Effort: Pulmonary effort is normal.     Breath sounds: Normal breath sounds. No wheezing, rhonchi or rales.  Abdominal:     General: There is no distension.     Palpations: Abdomen is soft. There is no hepatomegaly, splenomegaly or mass.     Tenderness: There is no abdominal tenderness.  Musculoskeletal:        General: Normal range of motion.     Cervical back: Normal range of motion and neck supple. No tenderness.     Right lower leg: No edema.     Left lower leg: No edema.  Lymphadenopathy:     Cervical: No cervical adenopathy.     Upper Body:     Right upper body: No supraclavicular or axillary adenopathy.     Left upper body: No supraclavicular or axillary adenopathy.     Lower Body: No right inguinal adenopathy. No left inguinal adenopathy.  Skin:  General: Skin is warm and dry.     Coloration: Skin is not jaundiced.     Findings: No rash.  Neurological:     Mental Status: She is alert and oriented to person, place, and time.     Cranial Nerves: No cranial nerve deficit.  Psychiatric:        Mood and Affect: Mood normal.        Behavior: Behavior normal.        Thought Content: Thought content normal.    LABS:       Latest Ref Rng & Units 08/31/2022   12:00 AM 07/02/2022   12:00 AM 06/12/2022   12:00 AM  CBC  WBC  4.1     2.6     5.2      Hemoglobin 12.0 - 16.0 13.5     12.5     13.1      Hematocrit 36 - 46 41     38     40      Platelets 150 - 400 K/uL 128      83     96         This result is from an external source.      Latest Ref Rng & Units 09/14/2022    2:16 PM 08/31/2022    3:06 PM 07/02/2022   12:00 AM  CMP  Glucose 70 - 99 mg/dL 453  90    BUN 6 - 20 mg/dL 19  12  14       Creatinine 0.44 - 1.00 mg/dL 6.46  8.03  1.2      Sodium 135 - 145 mmol/L 139  140  142      Potassium 3.5 - 5.1 mmol/L 3.9  4.3  3.5      Chloride 98 - 111 mmol/L 105  103  103      CO2 22 - 32 mmol/L 24  27  26       Calcium 8.9 - 10.3 mg/dL 9.3  9.6  9.0      Total Protein 6.5 - 8.1 g/dL 7.0  7.0    Total Bilirubin 0.3 - 1.2 mg/dL 0.6  0.5    Alkaline Phos 38 - 126 U/L 98  105  111      AST 15 - 41 U/L 22  21  31       ALT 0 - 44 U/L 18  18  26          This result is from an external source.   ASSESSMENT & PLAN:  Assessment/Plan:  A 57 y.o. female with metastatic colon cancer.  He will proceed with her second cycle of FOLFOX/Avastin this week.  Clinically, the patient appears to be doing very well.  I will see her back in 2 weeks before she heads into her 3rd cycle of FOLFOX/Avastin.  The patient understands all the plans discussed today and is in agreement with them.  Grant[place] Want Kirby Funk, MD

## 2022-09-15 ENCOUNTER — Other Ambulatory Visit: Payer: Self-pay | Admitting: Pharmacist

## 2022-09-15 MED FILL — Oxaliplatin IV Soln 100 MG/20ML: INTRAVENOUS | Qty: 30 | Status: AC

## 2022-09-15 MED FILL — Fosaprepitant Dimeglumine For IV Infusion 150 MG (Base Eq): INTRAVENOUS | Qty: 5 | Status: AC

## 2022-09-15 MED FILL — Fluorouracil IV Soln 2.5 GM/50ML (50 MG/ML): INTRAVENOUS | Qty: 14 | Status: AC

## 2022-09-15 MED FILL — Oxaliplatin IV Soln 50 MG/10ML: INTRAVENOUS | Qty: 30 | Status: AC

## 2022-09-15 MED FILL — Dexamethasone Sodium Phosphate Inj 100 MG/10ML: INTRAMUSCULAR | Qty: 1 | Status: AC

## 2022-09-15 MED FILL — Fluorouracil IV Soln 5 GM/100ML (50 MG/ML): INTRAVENOUS | Qty: 87 | Status: AC

## 2022-09-16 ENCOUNTER — Telehealth: Payer: Self-pay | Admitting: Dietician

## 2022-09-16 ENCOUNTER — Inpatient Hospital Stay: Payer: 59

## 2022-09-16 ENCOUNTER — Ambulatory Visit: Payer: 59 | Admitting: Dietician

## 2022-09-16 VITALS — BP 128/81 | HR 110 | Temp 97.5°F | Resp 18 | Ht 68.0 in | Wt 149.1 lb

## 2022-09-16 DIAGNOSIS — C7971 Secondary malignant neoplasm of right adrenal gland: Secondary | ICD-10-CM | POA: Diagnosis not present

## 2022-09-16 DIAGNOSIS — C189 Malignant neoplasm of colon, unspecified: Secondary | ICD-10-CM

## 2022-09-16 DIAGNOSIS — C7931 Secondary malignant neoplasm of brain: Secondary | ICD-10-CM | POA: Diagnosis not present

## 2022-09-16 DIAGNOSIS — C786 Secondary malignant neoplasm of retroperitoneum and peritoneum: Secondary | ICD-10-CM | POA: Diagnosis not present

## 2022-09-16 DIAGNOSIS — C7802 Secondary malignant neoplasm of left lung: Secondary | ICD-10-CM | POA: Diagnosis not present

## 2022-09-16 DIAGNOSIS — C7801 Secondary malignant neoplasm of right lung: Secondary | ICD-10-CM | POA: Diagnosis not present

## 2022-09-16 DIAGNOSIS — C787 Secondary malignant neoplasm of liver and intrahepatic bile duct: Secondary | ICD-10-CM | POA: Diagnosis not present

## 2022-09-16 DIAGNOSIS — Z5111 Encounter for antineoplastic chemotherapy: Secondary | ICD-10-CM | POA: Diagnosis not present

## 2022-09-16 DIAGNOSIS — C182 Malignant neoplasm of ascending colon: Secondary | ICD-10-CM

## 2022-09-16 MED ORDER — SODIUM CHLORIDE 0.9 % IV SOLN
5.0000 mg/kg | Freq: Once | INTRAVENOUS | Status: AC
  Administered 2022-09-16: 350 mg via INTRAVENOUS
  Filled 2022-09-16: qty 14

## 2022-09-16 MED ORDER — SODIUM CHLORIDE 0.9% FLUSH: 10.0000 mL | INTRAVENOUS | Status: DC | PRN

## 2022-09-16 MED ORDER — SODIUM CHLORIDE 0.9 % IV SOLN
150.0000 mg | Freq: Once | INTRAVENOUS | Status: AC
  Administered 2022-09-16: 150 mg via INTRAVENOUS
  Filled 2022-09-16: qty 150

## 2022-09-16 MED ORDER — FAMOTIDINE IN NACL 20-0.9 MG/50ML-% IV SOLN
20.0000 mg | Freq: Once | INTRAVENOUS | Status: AC
  Administered 2022-09-16: 20 mg via INTRAVENOUS
  Filled 2022-09-16: qty 50

## 2022-09-16 MED ORDER — OXALIPLATIN CHEMO INJECTION 50 MG/10ML
85.0000 mg/m2 | Freq: Once | INTRAVENOUS | Status: AC
  Administered 2022-09-16: 150 mg via INTRAVENOUS
  Filled 2022-09-16: qty 20

## 2022-09-16 MED ORDER — PALONOSETRON HCL INJECTION 0.25 MG/5ML
0.2500 mg | Freq: Once | INTRAVENOUS | Status: AC
  Administered 2022-09-16: 0.25 mg via INTRAVENOUS
  Filled 2022-09-16: qty 5

## 2022-09-16 MED ORDER — SODIUM CHLORIDE 0.9 % IV SOLN
2400.0000 mg/m2 | INTRAVENOUS | Status: DC
  Administered 2022-09-16: 4350 mg via INTRAVENOUS
  Filled 2022-09-16: qty 87

## 2022-09-16 MED ORDER — FLUOROURACIL CHEMO INJECTION 2.5 GM/50ML
400.0000 mg/m2 | Freq: Once | INTRAVENOUS | Status: AC
  Administered 2022-09-16: 700 mg via INTRAVENOUS
  Filled 2022-09-16: qty 14

## 2022-09-16 MED ORDER — SODIUM CHLORIDE 0.9 % IV SOLN: Freq: Once | INTRAVENOUS | Status: AC

## 2022-09-16 MED ORDER — SODIUM CHLORIDE 0.9 % IV SOLN
10.0000 mg | Freq: Once | INTRAVENOUS | Status: AC
  Administered 2022-09-16: 10 mg via INTRAVENOUS
  Filled 2022-09-16: qty 10

## 2022-09-16 MED ORDER — DIPHENHYDRAMINE HCL 50 MG/ML IJ SOLN
25.0000 mg | Freq: Once | INTRAMUSCULAR | Status: AC
  Administered 2022-09-16: 25 mg via INTRAVENOUS
  Filled 2022-09-16: qty 1

## 2022-09-16 MED ORDER — DEXTROSE 5 % IV SOLN: Freq: Once | INTRAVENOUS | Status: AC

## 2022-09-16 MED ORDER — HEPARIN SOD (PORK) LOCK FLUSH 100 UNIT/ML IV SOLN: 500.0000 [IU] | Freq: Once | INTRAVENOUS | Status: DC | PRN

## 2022-09-16 NOTE — Progress Notes (Signed)
Nutrition Assessment Patient returned message and spoke with me over her cell phone during her chemo.  Reason for Assessment: MST screen for weight loss.    ASSESSMENT: Patient is a 56 year old female with metastatic colon cancer, which includes spread of disease to her abdominal cavity, lungs, brain and right adrenal gland.  She has a PMHx that includes anemia, dehydration, GERD, OCD and orthostatic hypotension.  She is followed by Dr. Melvyn Neth and is getting treated with FOLFOX/Avastin.  She reports past two weeks she has had 2-3 episodes of vomiting.  She is now using Zofran with some improvement. Compazine hasn't provided much relief.   No other NIS inhibiting intake at this time, bowels are regular but somewhat sluggish.  She does use senna to help. She doesn't like ginger in Asian foods but hasn't tried and Gingerale, teas or candy.  Mom doesn't live with her but does help with meals. Appetite is improving but she states she can force herself to eat when she doesn't have an appetite as she knows it is good for her health.  Food s she enjoys regularly:cheese & crackers, apples with peanut butter,  cheerios with milk, ramen noodles When mom cooks (meat or other protein with 3 vegetables pattern) Will drink some ONS: Boost is OK, doesn't like Ensure products, Likes the Glucerna chocolate She tried to keep fluids up for renal health: water (sips all day long 48-60 oz) some orange juice or fruit punch, Snapple, coffee  Labs: 09/14/22  GFR 43, Creat 1.46   Anthropometrics: loss of 6# past 3 months and 65 pounds past 3 years unintentionally  Height: 68" Weight:  09/16/22  149# 06/26/22  155# UBW: 200# BMI: 22.67  INTERVENTION:  Relayed that nutrition services are wrap around service provided at no charge and encouraged continued communication if experiencing continued weight loss or any nutritional impact symptoms (NIS). Educated on importance of adequate calorie and protein energy intake  with  nutrient dense foods when possible to maintain weight/strength Reviewed tips for managing nausea. Encouraged small frequent feeds and trying to eat 6 small meals Suggested oral nutrition supplement, encouraged her to get coupons from RNs today for Glucerna Discussed strategies for managing constipation Emailed Nutrition Tip sheet  for  nausea and constipation also sent through mail with Glucerna coupons and contact information provided.   MONITORING, EVALUATION, GOAL: weight, PO intake, Nutrition Impact Symptoms, labs Goal is weight maintenance  Next Visit: PRN at patient or provider request  Gennaro Africa, RDN, LDN Registered Dietitian, [facility] Part Time Remote (Usual office hours: Tuesday-Thursday) Cell: [phone]

## 2022-09-16 NOTE — Patient Instructions (Signed)
Oxaliplatin Injection What is this medication? OXALIPLATIN (ox AL i PLA tin) treats colorectal cancer. It works by slowing down the growth of cancer cells. This medicine may be used for other purposes; ask your health care provider or pharmacist if you have questions. COMMON BRAND NAME(S): Eloxatin What should I tell my care team before I take this medication? They need to know if you have any of these conditions: Heart disease History of irregular heartbeat or rhythm Liver disease Low blood cell levels (white cells, red cells, and platelets) Lung or breathing disease, such as asthma Take medications that treat or prevent blood clots Tingling of the fingers, toes, or other nerve disorder An unusual or allergic reaction to oxaliplatin, other medications, foods, dyes, or preservatives If you or your partner are pregnant or trying to get pregnant Breast-feeding How should I use this medication? This medication is injected into a vein. It is given by your care team in a hospital or clinic setting. Talk to your care team about the use of this medication in children. Special care may be needed. Overdosage: If you think you have taken too much of this medicine contact a poison control center or emergency room at once. NOTE: This medicine is only for you. Do not share this medicine with others. What if I miss a dose? Keep appointments for follow-up doses. It is important not to miss a dose. Call your care team if you are unable to keep an appointment. What may interact with this medication? Do not take this medication with any of the following: Cisapride Dronedarone Pimozide Thioridazine This medication may also interact with the following: Aspirin and aspirin-like medications Certain medications that treat or prevent blood clots, such as warfarin, apixaban, dabigatran, and rivaroxaban Cisplatin Cyclosporine Diuretics Medications for infection, such as acyclovir, adefovir, amphotericin B,  bacitracin, cidofovir, foscarnet, ganciclovir, gentamicin, pentamidine, vancomycin NSAIDs, medications for pain and inflammation, such as ibuprofen or naproxen Other medications that cause heart rhythm changes Pamidronate Zoledronic acid This list may not describe all possible interactions. Give your health care provider a list of all the medicines, herbs, non-prescription drugs, or dietary supplements you use. Also tell them if you smoke, drink alcohol, or use illegal drugs. Some items may interact with your medicine. What should I watch for while using this medication? Your condition will be monitored carefully while you are receiving this medication. You may need blood work while taking this medication. This medication may make you feel generally unwell. This is not uncommon as chemotherapy can affect healthy cells as well as cancer cells. Report any side effects. Continue your course of treatment even though you feel ill unless your care team tells you to stop. This medication may increase your risk of getting an infection. Call your care team for advice if you get a fever, chills, sore throat, or other symptoms of a cold or flu. Do not treat yourself. Try to avoid being around people who are sick. Avoid taking medications that contain aspirin, acetaminophen, ibuprofen, naproxen, or ketoprofen unless instructed by your care team. These medications may hide a fever. Be careful brushing or flossing your teeth or using a toothpick because you may get an infection or bleed more easily. If you have any dental work done, tell your dentist you are receiving this medication. This medication can make you more sensitive to cold. Do not drink cold drinks or use ice. Cover exposed skin before coming in contact with cold temperatures or cold objects. When out in cold weather  wear warm clothing and cover your mouth and nose to warm the air that goes into your lungs. Tell your care team if you get sensitive to the  cold. Talk to your care team if you or your partner are pregnant or think either of you might be pregnant. This medication can cause serious birth defects if taken during pregnancy and for 9 months after the last dose. A negative pregnancy test is required before starting this medication. A reliable form of contraception is recommended while taking this medication and for 9 months after the last dose. Talk to your care team about effective forms of contraception. Do not father a child while taking this medication and for 6 months after the last dose. Use a condom while having sex during this time period. Do not breastfeed while taking this medication and for 3 months after the last dose. This medication may cause infertility. Talk to your care team if you are concerned about your fertility. What side effects may I notice from receiving this medication? Side effects that you should report to your care team as soon as possible: Allergic reactions--skin rash, itching, hives, swelling of the face, lips, tongue, or throat Bleeding--bloody or black, tar-like stools, vomiting blood or brown material that looks like coffee grounds, red or dark brown urine, small red or purple spots on skin, unusual bruising or bleeding Dry cough, shortness of breath or trouble breathing Heart rhythm changes--fast or irregular heartbeat, dizziness, feeling faint or lightheaded, chest pain, trouble breathing Infection--fever, chills, cough, sore throat, wounds that don't heal, pain or trouble when passing urine, general feeling of discomfort or being unwell Liver injury--right upper belly pain, loss of appetite, nausea, light-colored stool, dark yellow or brown urine, yellowing skin or eyes, unusual weakness or fatigue Low red blood cell level--unusual weakness or fatigue, dizziness, headache, trouble breathing Muscle injury--unusual weakness or fatigue, muscle pain, dark yellow or brown urine, decrease in amount of urine Pain,  tingling, or numbness in the hands or feet Sudden and severe headache, confusion, change in vision, seizures, which may be signs of posterior reversible encephalopathy syndrome (PRES) Unusual bruising or bleeding Side effects that usually do not require medical attention (report to your care team if they continue or are bothersome): Diarrhea Nausea Pain, redness, or swelling with sores inside the mouth or throat Unusual weakness or fatigue Vomiting This list may not describe all possible side effects. Call your doctor for medical advice about side effects. You may report side effects to FDA at 1-800-FDA-1088. Where should I keep my medication? This medication is given in a hospital or clinic. It will not be stored at home. NOTE: This sheet is a summary. It may not cover all possible information. If you have questions about this medicine, talk to your doctor, pharmacist, or health care provider.  2023 Elsevier/Gold Standard (2007-07-09 00:00:00) Fluorouracil Injection What is this medication? FLUOROURACIL (flure oh YOOR a sil) treats some types of cancer. It works by slowing down the growth of cancer cells. This medicine may be used for other purposes; ask your health care provider or pharmacist if you have questions. COMMON BRAND NAME(S): Adrucil What should I tell my care team before I take this medication? They need to know if you have any of these conditions: Blood disorders Dihydropyrimidine dehydrogenase (DPD) deficiency Infection, such as chickenpox, cold sores, herpes Kidney disease Liver disease Poor nutrition Recent or ongoing radiation therapy An unusual or allergic reaction to fluorouracil, other medications, foods, dyes, or preservatives If you  or your partner are pregnant or trying to get pregnant Breast-feeding How should I use this medication? This medication is injected into a vein. It is administered by your care team in a hospital or clinic setting. Talk to your  care team about the use of this medication in children. Special care may be needed. Overdosage: If you think you have taken too much of this medicine contact a poison control center or emergency room at once. NOTE: This medicine is only for you. Do not share this medicine with others. What if I miss a dose? Keep appointments for follow-up doses. It is important not to miss your dose. Call your care team if you are unable to keep an appointment. What may interact with this medication? Do not take this medication with any of the following: Live virus vaccines This medication may also interact with the following: Medications that treat or prevent blood clots, such as warfarin, enoxaparin, dalteparin This list may not describe all possible interactions. Give your health care provider a list of all the medicines, herbs, non-prescription drugs, or dietary supplements you use. Also tell them if you smoke, drink alcohol, or use illegal drugs. Some items may interact with your medicine. What should I watch for while using this medication? Your condition will be monitored carefully while you are receiving this medication. This medication may make you feel generally unwell. This is not uncommon as chemotherapy can affect healthy cells as well as cancer cells. Report any side effects. Continue your course of treatment even though you feel ill unless your care team tells you to stop. In some cases, you may be given additional medications to help with side effects. Follow all directions for their use. This medication may increase your risk of getting an infection. Call your care team for advice if you get a fever, chills, sore throat, or other symptoms of a cold or flu. Do not treat yourself. Try to avoid being around people who are sick. This medication may increase your risk to bruise or bleed. Call your care team if you notice any unusual bleeding. Be careful brushing or flossing your teeth or using a  toothpick because you may get an infection or bleed more easily. If you have any dental work done, tell your dentist you are receiving this medication. Avoid taking medications that contain aspirin, acetaminophen, ibuprofen, naproxen, or ketoprofen unless instructed by your care team. These medications may hide a fever. Do not treat diarrhea with over the counter products. Contact your care team if you have diarrhea that lasts more than 2 days or if it is severe and watery. This medication can make you more sensitive to the sun. Keep out of the sun. If you cannot avoid being in the sun, wear protective clothing and sunscreen. Do not use sun lamps, tanning beds, or tanning booths. Talk to your care team if you or your partner wish to become pregnant or think you might be pregnant. This medication can cause serious birth defects if taken during pregnancy and for 3 months after the last dose. A reliable form of contraception is recommended while taking this medication and for 3 months after the last dose. Talk to your care team about effective forms of contraception. Do not father a child while taking this medication and for 3 months after the last dose. Use a condom while having sex during this time period. Do not breastfeed while taking this medication. This medication may cause infertility. Talk to your care team if  you are concerned about your fertility. What side effects may I notice from receiving this medication? Side effects that you should report to your care team as soon as possible: Allergic reactions--skin rash, itching, hives, swelling of the face, lips, tongue, or throat Heart attack--pain or tightness in the chest, shoulders, arms, or jaw, nausea, shortness of breath, cold or clammy skin, feeling faint or lightheaded Heart failure--shortness of breath, swelling of the ankles, feet, or hands, sudden weight gain, unusual weakness or fatigue Heart rhythm changes--fast or irregular heartbeat,  dizziness, feeling faint or lightheaded, chest pain, trouble breathing High ammonia level--unusual weakness or fatigue, confusion, loss of appetite, nausea, vomiting, seizures Infection--fever, chills, cough, sore throat, wounds that don't heal, pain or trouble when passing urine, general feeling of discomfort or being unwell Low red blood cell level--unusual weakness or fatigue, dizziness, headache, trouble breathing Pain, tingling, or numbness in the hands or feet, muscle weakness, change in vision, confusion or trouble speaking, loss of balance or coordination, trouble walking, seizures Redness, swelling, and blistering of the skin over hands and feet Severe or prolonged diarrhea Unusual bruising or bleeding Side effects that usually do not require medical attention (report to your care team if they continue or are bothersome): Dry skin Headache Increased tears Nausea Pain, redness, or swelling with sores inside the mouth or throat Sensitivity to light Vomiting This list may not describe all possible side effects. Call your doctor for medical advice about side effects. You may report side effects to FDA at 1-800-FDA-1088. Where should I keep my medication? This medication is given in a hospital or clinic. It will not be stored at home. NOTE: This sheet is a summary. It may not cover all possible information. If you have questions about this medicine, talk to your doctor, pharmacist, or health care provider.  2023 Elsevier/Gold Standard (2021-09-16 00:00:00)

## 2022-09-16 NOTE — Telephone Encounter (Signed)
Patient screened on MST. First attempt to reach. Provided my cell# on voice mail  and in text to return call to set up a nutrition consult.  Cyndi Rishith Siddoway, RDN, LDN Registered Dietitian, [facility] Part Time Remote (Usual office hours: Tuesday-Thursday) Cell: [phone]   

## 2022-09-18 ENCOUNTER — Inpatient Hospital Stay: Payer: 59

## 2022-09-18 VITALS — BP 115/82 | HR 93 | Temp 98.1°F | Resp 18 | Ht 68.0 in | Wt 150.0 lb

## 2022-09-18 DIAGNOSIS — C189 Malignant neoplasm of colon, unspecified: Secondary | ICD-10-CM

## 2022-09-18 DIAGNOSIS — C7931 Secondary malignant neoplasm of brain: Secondary | ICD-10-CM | POA: Diagnosis not present

## 2022-09-18 DIAGNOSIS — C7801 Secondary malignant neoplasm of right lung: Secondary | ICD-10-CM | POA: Diagnosis not present

## 2022-09-18 DIAGNOSIS — C182 Malignant neoplasm of ascending colon: Secondary | ICD-10-CM

## 2022-09-18 DIAGNOSIS — C787 Secondary malignant neoplasm of liver and intrahepatic bile duct: Secondary | ICD-10-CM | POA: Diagnosis not present

## 2022-09-18 DIAGNOSIS — C786 Secondary malignant neoplasm of retroperitoneum and peritoneum: Secondary | ICD-10-CM | POA: Diagnosis not present

## 2022-09-18 DIAGNOSIS — C7802 Secondary malignant neoplasm of left lung: Secondary | ICD-10-CM | POA: Diagnosis not present

## 2022-09-18 DIAGNOSIS — C7971 Secondary malignant neoplasm of right adrenal gland: Secondary | ICD-10-CM | POA: Diagnosis not present

## 2022-09-18 DIAGNOSIS — Z5111 Encounter for antineoplastic chemotherapy: Secondary | ICD-10-CM | POA: Diagnosis not present

## 2022-09-18 MED ORDER — SODIUM CHLORIDE 0.9% FLUSH
10.0000 mL | INTRAVENOUS | Status: DC | PRN
  Administered 2022-09-18: 10 mL

## 2022-09-18 MED ORDER — HEPARIN SOD (PORK) LOCK FLUSH 100 UNIT/ML IV SOLN
500.0000 [IU] | Freq: Once | INTRAVENOUS | Status: AC | PRN
  Administered 2022-09-18: 500 [IU]

## 2022-09-18 NOTE — Patient Instructions (Signed)
Managing Chemotherapy Side Effects, Adult Chemotherapy is a treatment that uses medicine to kill cancer cells. However, in addition to killing cancer cells, the medicines can also damage healthy cells. The damage to healthy cells can lead to side effects. The exact side effects depend on the specific medicines used. Most of the side effects of chemotherapy go away once treatment is finished. Until then, work closely with your health care providers and take an active role in managing your side effects. What are common side effects of chemotherapy? Increased risk of infection, bruising, or bleeding. Nausea and vomiting. Constipation or diarrhea. Loss of appetite. Hair loss. Mouth or throat sores. Tiredness (fatigue). Tingling, pain, or numbness in the hands and feet. Dry, sensitive, itchy, or sore skin. Sleep disturbances, such as excessive sleepiness. Confusion, anxiety, or mood swings. Memory changes. How to manage the side effects of chemotherapy Medicines Take over-the-counter and prescription medicines only as told by your health care provider. Talk with your health care provider before taking vitamins, herbs, supplements, or over-the-counter medicines. Some of these can interfere with chemotherapy. Activity Get plenty of rest. Get regular exercise by doing activities such as walking, gentle yoga, or tai chi. Return to your normal activities as told by your health care provider. Ask your health care provider what activities are safe for you. Eating and drinking  Talk to a dietitian about what you should eat and drink during cancer treatment. Drink enough fluid to keep your urine pale yellow. If you have side effects that affect eating, these tips may help: Eat smaller meals and snacks often. Drink high-nutrition and high-calorie shakes or supplements. Choose bland and soft foods that are easy to eat. Do not eat foods that are hot, spicy, or hard to swallow. Do not eat raw or  undercooked meat, eggs, or seafood. Always wash fresh fruits and vegetables well before eating them. Skin care If you have sore or itchy skin: Wear soft, comfortable clothing. Apply creams and ointments to your skin as told by your health care provider. If you lose your hair, consider wearing a wig, hat, or scarf to cover your head. You may want to have someone shave your head as you start to lose hair. During outdoor activities, protect your head and skin from the sun by using sunscreen with an SPF of 30 or higher or by wearing protective clothing and a hat. Meet with a hair and skin care specialist for makeup and skin care tips. Apply sunscreen to your scalp as told by your health care provider. General tips Learn as much as you can about your condition. If you are struggling emotionally, talk with a mental health care provider or join a support group. Keep all follow-up visits. This is important. How to prevent infection and bleeding Chemotherapy may lower your blood counts and put you at risk for infection and bleeding. Here are some ways to help prevent problems. Vaccines Talk to your health care provider about vaccines. You should not get any live vaccines, such as the polio, MMR, chickenpox, and shingles vaccines until your health care provider says that it is safe to do so. Do not be around people who have had live vaccines for as long as your health care provider recommends. Make sure you get a yearly flu shot. People who will be near you should also get a yearly flu shot. Social activity Stay away from crowded places where you could be exposed to germs. Do not be around people who may be sick or   people who have symptoms of a fever until they have been fever-free for at least 24 hours. Do not share food, cups, straws, or utensils with other people. Wear a mask when outside the home if your blood counts are low. Cleanliness  Wash your hands often for at least 20 seconds. Also make  sure that other members of your household wash their hands often. Brush your teeth twice daily using a soft toothbrush. Use mouth rinse only as told by your health care provider. Take a bath or shower daily unless your health care provider gives different instructions. General tips Take your temperature regularly, especially if you have chills or feel warm. Check with your health care provider: Before you travel. Before you have a dental procedure. Before you use a swimming pool, hot tub, or swim in a lake or ocean. If you get chemotherapy through an IV or port, check the site every day for signs of infection. Check for redness, swelling, pain, fluid, and warmth. Avoid activities that put you at risk for injury or bleeding. Use an electric razor to shave instead of a blade. Questions to ask your health care provider What are the most common side effects of my treatment? How will they affect my daily life? What can I do to manage them? What are some possible long-term side effects? What are possible complications? What support services are available? What number can I call with questions or concerns? Where to find support Cancer affects the entire family. Find out what family support resources are available from your cancer treatment center. For more support, turn to: Your cancer care team. Friends and family. Your religious community. Other people with cancer. Community-based or online support groups. Where to find more information National Cancer Institute: www.cancer.gov American Cancer Society: www.cancer.org Contact a health care provider if: You bleed or bruise more often. You notice blood in your urine or stool. You have any of these symptoms: A skin rash, or dry or itchy skin. A headache or stiff neck. Cold or flu symptoms. A cough. Persistent nausea or vomiting. Persistent diarrhea. Frequent urination, burning when passing urine, or foul-smelling urine. You cannot eat  because of mouth or throat pain. You are sad, confused, anxious, or depressed. Get help right away if: You have any of these symptoms: A fever or chills. Your health care provider should know about this right away. Redness, swelling, pain, fluid, or warmth near an IV site. Bleeding that you cannot stop. A seizure. You cannot swallow. You have chest pain. You have trouble breathing. A family member or caregiver should get help right away if you have a sudden or unusual change in behavior. These symptoms may be an emergency. Get help right away. Call 911. Do not wait to see if the symptoms will go away. Do not drive yourself to the hospital. Summary Chemotherapy is a treatment that uses medicine to kill cancer cells and can cause side effects. The specific side effects depend on the specific medicines used. Learn as much as you can about your condition. Ask about side effects to watch for and how to treat them. Seek out support and resources from others. Find out what family support resources are available from your cancer treatment center. Let your health care provider know if you notice any new, unusual, or worsening symptoms, especially fever or chills. This information is not intended to replace advice given to you by your health care provider. Make sure you discuss any questions you have with your health   care provider. Document Revised: 05/08/2021 Document Reviewed: 05/08/2021 Elsevier Patient Education  2023 Elsevier Inc.  

## 2022-09-26 ENCOUNTER — Other Ambulatory Visit: Payer: Self-pay | Admitting: Psychiatry

## 2022-09-26 DIAGNOSIS — F333 Major depressive disorder, recurrent, severe with psychotic symptoms: Secondary | ICD-10-CM

## 2022-09-27 NOTE — Progress Notes (Signed)
[facility]  [address] [place],  [place]  [address] [phone]  Clinic Day:  09/28/2022  Referring physician: Weston Settle, MD  HISTORY OF PRESENT ILLNESS:  The patient is a 57 y.o. female with  metastatic colon cancer, which includes spread of disease to her abdominal cavity, lungs, brain and right adrenal gland. Recent CT scans at [facility] showed 3 liver metastases.   She comes in today to be evaluated before heading into her 3rd cycle of FOLFOX/Avastin.  The patient claims to have tolerated her 2nd cycle of this chemotherapy regimen fairly well.  She did have a few days of nausea.  Otherwise, her daily quality of life has been good.  She denies having any new GI symptoms or findings which concern her for overt signs of disease progression.   With respect to her colon cancer history, she is status post a right hemicolectomy in early October 2018, followed by 12 cycles of adjuvant FOLFOX chemotherapy, which were completed in April 2019.  In December 2021, she underwent a left cerebellar metastasectomy, whose pathology was consistent with metastatic colon cancer.  Tumor testing did come back MMR normal. CT scans revealed evidence of her cancer being in multiple locations, for which she took 40 cycles of FOLFIRI/Avastin.  Recent scans have just revealed liver metastasis, please have her back on FOLFOX/Avastin.     PHYSICAL EXAM:  Blood pressure 123/79, pulse 91, temperature 98.3 F (36.8 C), temperature source Oral, resp. rate 16, height 5\' 8"  (1.727 m), weight 148 lb 14.4 oz (67.5 kg), last menstrual period 02/22/2019, SpO2 98 %. Body mass index is 22.64 kg/m.  Performance status (ECOG): 1 - Symptomatic but completely ambulatory  Physical Exam Vitals and nursing note reviewed.  Constitutional:      General: She is not in acute distress.    Appearance: Normal appearance.  HENT:     Head: Normocephalic and atraumatic.     Mouth/Throat:     Mouth:  Mucous membranes are moist.     Pharynx: Oropharynx is clear. No oropharyngeal exudate or posterior oropharyngeal erythema.  Eyes:     General: No scleral icterus.    Extraocular Movements: Extraocular movements intact.     Conjunctiva/sclera: Conjunctivae normal.     Pupils: Pupils are equal, round, and reactive to light.  Cardiovascular:     Rate and Rhythm: Normal rate and regular rhythm.     Heart sounds: Normal heart sounds. No murmur heard.    No friction rub. No gallop.  Pulmonary:     Effort: Pulmonary effort is normal.     Breath sounds: Normal breath sounds. No wheezing, rhonchi or rales.  Abdominal:     General: There is no distension.     Palpations: Abdomen is soft. There is no hepatomegaly, splenomegaly or mass.     Tenderness: There is no abdominal tenderness.  Musculoskeletal:        General: Normal range of motion.     Cervical back: Normal range of motion and neck supple. No tenderness.     Right lower leg: No edema.     Left lower leg: No edema.  Lymphadenopathy:     Cervical: No cervical adenopathy.     Upper Body:     Right upper body: No supraclavicular or axillary adenopathy.     Left upper body: No supraclavicular or axillary adenopathy.     Lower Body: No right inguinal adenopathy. No left inguinal adenopathy.  Skin:  General: Skin is warm and dry.     Coloration: Skin is not jaundiced.     Findings: No rash.  Neurological:     Mental Status: She is alert and oriented to person, place, and time.     Cranial Nerves: No cranial nerve deficit.  Psychiatric:        Mood and Affect: Mood normal.        Behavior: Behavior normal.        Thought Content: Thought content normal.    LABS:       Latest Ref Rng & Units 09/28/2022   12:00 AM 09/14/2022   12:00 AM 08/31/2022   12:00 AM  CBC  WBC  3.9     4.8     4.1      Hemoglobin 12.0 - 16.0 12.8     13.0     13.5      Hematocrit 36 - 46 39     39     41      Platelets 150 - 400 K/uL 98     118      128         This result is from an external source.      Latest Ref Rng & Units 09/14/2022    2:16 PM 08/31/2022    3:06 PM 07/02/2022   12:00 AM  CMP  Glucose 70 - 99 mg/dL 161  90    BUN 6 - 20 mg/dL 19  12  14       Creatinine 0.44 - 1.00 mg/dL 0.96  0.45  1.2      Sodium 135 - 145 mmol/L 139  140  142      Potassium 3.5 - 5.1 mmol/L 3.9  4.3  3.5      Chloride 98 - 111 mmol/L 105  103  103      CO2 22 - 32 mmol/L 24  27  26       Calcium 8.9 - 10.3 mg/dL 9.3  9.6  9.0      Total Protein 6.5 - 8.1 g/dL 7.0  7.0    Total Bilirubin 0.3 - 1.2 mg/dL 0.6  0.5    Alkaline Phos 38 - 126 U/L 98  105  111      AST 15 - 41 U/L 22  21  31       ALT 0 - 44 U/L 18  18  26          This result is from an external source.   ASSESSMENT & PLAN:  Assessment/Plan:  A 57 y.o. female with metastatic colon cancer.  He will proceed with her 3rd cycle of FOLFOX/Avastin this week.  Clinically, the patient appears to be doing very well.  I will see her back in 2 weeks before she heads into her 4th cycle of FOLFOX/Avastin.  The patient understands all the plans discussed today and is in agreement with them.  Markian Glockner Kirby Funk, MD

## 2022-09-28 ENCOUNTER — Other Ambulatory Visit: Payer: Self-pay | Admitting: Psychiatry

## 2022-09-28 ENCOUNTER — Inpatient Hospital Stay: Payer: 59

## 2022-09-28 ENCOUNTER — Inpatient Hospital Stay (HOSPITAL_BASED_OUTPATIENT_CLINIC_OR_DEPARTMENT_OTHER): Payer: 59 | Admitting: Oncology

## 2022-09-28 ENCOUNTER — Other Ambulatory Visit: Payer: Self-pay | Admitting: Oncology

## 2022-09-28 DIAGNOSIS — C189 Malignant neoplasm of colon, unspecified: Secondary | ICD-10-CM | POA: Diagnosis not present

## 2022-09-28 DIAGNOSIS — C7800 Secondary malignant neoplasm of unspecified lung: Secondary | ICD-10-CM | POA: Diagnosis not present

## 2022-09-28 DIAGNOSIS — R530 Neoplastic (malignant) related fatigue: Secondary | ICD-10-CM

## 2022-09-28 DIAGNOSIS — C787 Secondary malignant neoplasm of liver and intrahepatic bile duct: Secondary | ICD-10-CM | POA: Diagnosis not present

## 2022-09-28 DIAGNOSIS — F333 Major depressive disorder, recurrent, severe with psychotic symptoms: Secondary | ICD-10-CM

## 2022-09-28 DIAGNOSIS — C7802 Secondary malignant neoplasm of left lung: Secondary | ICD-10-CM | POA: Diagnosis not present

## 2022-09-28 DIAGNOSIS — C182 Malignant neoplasm of ascending colon: Secondary | ICD-10-CM

## 2022-09-28 DIAGNOSIS — C786 Secondary malignant neoplasm of retroperitoneum and peritoneum: Secondary | ICD-10-CM | POA: Diagnosis not present

## 2022-09-28 DIAGNOSIS — C7931 Secondary malignant neoplasm of brain: Secondary | ICD-10-CM | POA: Diagnosis not present

## 2022-09-28 DIAGNOSIS — G3184 Mild cognitive impairment, so stated: Secondary | ICD-10-CM

## 2022-09-28 DIAGNOSIS — C7971 Secondary malignant neoplasm of right adrenal gland: Secondary | ICD-10-CM | POA: Diagnosis not present

## 2022-09-28 DIAGNOSIS — C7801 Secondary malignant neoplasm of right lung: Secondary | ICD-10-CM | POA: Diagnosis not present

## 2022-09-28 DIAGNOSIS — Z5111 Encounter for antineoplastic chemotherapy: Secondary | ICD-10-CM | POA: Diagnosis not present

## 2022-09-28 LAB — CMP (CANCER CENTER ONLY)
ALT: 21 U/L (ref 0–44)
AST: 24 U/L (ref 15–41)
Albumin: 3.7 g/dL (ref 3.5–5.0)
Alkaline Phosphatase: 92 U/L (ref 38–126)
Anion gap: 11 (ref 5–15)
BUN: 16 mg/dL (ref 6–20)
CO2: 25 mmol/L (ref 22–32)
Calcium: 9.2 mg/dL (ref 8.9–10.3)
Chloride: 104 mmol/L (ref 98–111)
Creatinine: 1.31 mg/dL — ABNORMAL HIGH (ref 0.44–1.00)
GFR, Estimated: 48 mL/min — ABNORMAL LOW (ref >60)
Glucose, Bld: 93 mg/dL (ref 70–99)
Potassium: 4.3 mmol/L (ref 3.5–5.1)
Sodium: 140 mmol/L (ref 135–145)
Total Bilirubin: 0.5 mg/dL (ref 0.3–1.2)
Total Protein: 6.6 g/dL (ref 6.5–8.1)

## 2022-09-28 LAB — TOTAL PROTEIN, URINE DIPSTICK: Protein, ur: NEGATIVE mg/dL

## 2022-09-28 LAB — CBC AND DIFFERENTIAL
HCT: 39 (ref 36–46)
Hemoglobin: 12.8 (ref 12.0–16.0)
Neutrophils Absolute: 1.91
Platelets: 98 10*3/uL — AB (ref 150–400)
WBC: 3.9

## 2022-09-28 LAB — CBC: RBC: 4.14 (ref 3.87–5.11)

## 2022-09-28 LAB — CBC W DIFFERENTIAL (~~LOC~~ CC SCANNED REPORT)

## 2022-09-28 MED ORDER — PROMETHAZINE HCL 25 MG PO TABS: 25.0000 mg | ORAL_TABLET | Freq: Four times a day (QID) | ORAL | 3 refills | Status: AC | PRN

## 2022-09-29 MED FILL — Fluorouracil IV Soln 2.5 GM/50ML (50 MG/ML): INTRAVENOUS | Qty: 14 | Status: AC

## 2022-09-29 MED FILL — Fosaprepitant Dimeglumine For IV Infusion 150 MG (Base Eq): INTRAVENOUS | Qty: 5 | Status: AC

## 2022-09-29 MED FILL — Fluorouracil IV Soln 5 GM/100ML (50 MG/ML): INTRAVENOUS | Qty: 87 | Status: AC

## 2022-09-29 MED FILL — Dexamethasone Sodium Phosphate Inj 100 MG/10ML: INTRAMUSCULAR | Qty: 1 | Status: AC

## 2022-09-30 ENCOUNTER — Inpatient Hospital Stay: Payer: 59 | Attending: Oncology

## 2022-09-30 VITALS — BP 124/72 | HR 96 | Temp 97.7°F | Resp 18 | Ht 68.0 in | Wt 149.1 lb

## 2022-09-30 DIAGNOSIS — Z5111 Encounter for antineoplastic chemotherapy: Secondary | ICD-10-CM | POA: Insufficient documentation

## 2022-09-30 DIAGNOSIS — Z79630 Long term (current) use of alkylating agent: Secondary | ICD-10-CM | POA: Insufficient documentation

## 2022-09-30 DIAGNOSIS — Z79631 Long term (current) use of antimetabolite agent: Secondary | ICD-10-CM | POA: Diagnosis not present

## 2022-09-30 DIAGNOSIS — C7931 Secondary malignant neoplasm of brain: Secondary | ICD-10-CM | POA: Insufficient documentation

## 2022-09-30 DIAGNOSIS — C786 Secondary malignant neoplasm of retroperitoneum and peritoneum: Secondary | ICD-10-CM | POA: Diagnosis not present

## 2022-09-30 DIAGNOSIS — C7801 Secondary malignant neoplasm of right lung: Secondary | ICD-10-CM | POA: Diagnosis not present

## 2022-09-30 DIAGNOSIS — C182 Malignant neoplasm of ascending colon: Secondary | ICD-10-CM

## 2022-09-30 DIAGNOSIS — Z5189 Encounter for other specified aftercare: Secondary | ICD-10-CM | POA: Insufficient documentation

## 2022-09-30 DIAGNOSIS — C7971 Secondary malignant neoplasm of right adrenal gland: Secondary | ICD-10-CM | POA: Insufficient documentation

## 2022-09-30 DIAGNOSIS — C189 Malignant neoplasm of colon, unspecified: Secondary | ICD-10-CM | POA: Diagnosis not present

## 2022-09-30 DIAGNOSIS — C787 Secondary malignant neoplasm of liver and intrahepatic bile duct: Secondary | ICD-10-CM | POA: Insufficient documentation

## 2022-09-30 DIAGNOSIS — C7802 Secondary malignant neoplasm of left lung: Secondary | ICD-10-CM | POA: Insufficient documentation

## 2022-09-30 MED ORDER — OXALIPLATIN CHEMO INJECTION 100 MG/20ML
85.0000 mg/m2 | Freq: Once | INTRAVENOUS | Status: AC
  Administered 2022-09-30: 150 mg via INTRAVENOUS
  Filled 2022-09-30: qty 20

## 2022-09-30 MED ORDER — FAMOTIDINE IN NACL 20-0.9 MG/50ML-% IV SOLN
20.0000 mg | Freq: Once | INTRAVENOUS | Status: AC
  Administered 2022-09-30: 20 mg via INTRAVENOUS
  Filled 2022-09-30: qty 50

## 2022-09-30 MED ORDER — SODIUM CHLORIDE 0.9 % IV SOLN
5.0000 mg/kg | Freq: Once | INTRAVENOUS | Status: AC
  Administered 2022-09-30: 350 mg via INTRAVENOUS
  Filled 2022-09-30: qty 14

## 2022-09-30 MED ORDER — SODIUM CHLORIDE 0.9 % IV SOLN: Freq: Once | INTRAVENOUS | Status: AC

## 2022-09-30 MED ORDER — SODIUM CHLORIDE 0.9 % IV SOLN
2400.0000 mg/m2 | INTRAVENOUS | Status: DC
  Administered 2022-09-30: 4350 mg via INTRAVENOUS
  Filled 2022-09-30: qty 87

## 2022-09-30 MED ORDER — SODIUM CHLORIDE 0.9 % IV SOLN
150.0000 mg | Freq: Once | INTRAVENOUS | Status: AC
  Administered 2022-09-30: 150 mg via INTRAVENOUS
  Filled 2022-09-30: qty 150

## 2022-09-30 MED ORDER — PALONOSETRON HCL INJECTION 0.25 MG/5ML
0.2500 mg | Freq: Once | INTRAVENOUS | Status: AC
  Administered 2022-09-30: 0.25 mg via INTRAVENOUS
  Filled 2022-09-30: qty 5

## 2022-09-30 MED ORDER — DIPHENHYDRAMINE HCL 50 MG/ML IJ SOLN
25.0000 mg | Freq: Once | INTRAMUSCULAR | Status: AC
  Administered 2022-09-30: 25 mg via INTRAVENOUS
  Filled 2022-09-30: qty 1

## 2022-09-30 MED ORDER — DEXTROSE 5 % IV SOLN: Freq: Once | INTRAVENOUS | Status: AC

## 2022-09-30 MED ORDER — SODIUM CHLORIDE 0.9 % IV SOLN
10.0000 mg | Freq: Once | INTRAVENOUS | Status: AC
  Administered 2022-09-30: 10 mg via INTRAVENOUS
  Filled 2022-09-30: qty 10

## 2022-09-30 MED ORDER — FLUOROURACIL CHEMO INJECTION 2.5 GM/50ML
400.0000 mg/m2 | Freq: Once | INTRAVENOUS | Status: AC
  Administered 2022-09-30: 700 mg via INTRAVENOUS
  Filled 2022-09-30: qty 14

## 2022-09-30 NOTE — Patient Instructions (Signed)
Fluorouracil Injection What is this medication? FLUOROURACIL (flure oh YOOR a sil) treats some types of cancer. It works by slowing down the growth of cancer cells. This medicine may be used for other purposes; ask your health care provider or pharmacist if you have questions. COMMON BRAND NAME(S): Adrucil What should I tell my care team before I take this medication? They need to know if you have any of these conditions: Blood disorders Dihydropyrimidine dehydrogenase (DPD) deficiency Infection, such as chickenpox, cold sores, herpes Kidney disease Liver disease Poor nutrition Recent or ongoing radiation therapy An unusual or allergic reaction to fluorouracil, other medications, foods, dyes, or preservatives If you or your partner are pregnant or trying to get pregnant Breast-feeding How should I use this medication? This medication is injected into a vein. It is administered by your care team in a hospital or clinic setting. Talk to your care team about the use of this medication in children. Special care may be needed. Overdosage: If you think you have taken too much of this medicine contact a poison control center or emergency room at once. NOTE: This medicine is only for you. Do not share this medicine with others. What if I miss a dose? Keep appointments for follow-up doses. It is important not to miss your dose. Call your care team if you are unable to keep an appointment. What may interact with this medication? Do not take this medication with any of the following: Live virus vaccines This medication may also interact with the following: Medications that treat or prevent blood clots, such as warfarin, enoxaparin, dalteparin This list may not describe all possible interactions. Give your health care provider a list of all the medicines, herbs, non-prescription drugs, or dietary supplements you use. Also tell them if you smoke, drink alcohol, or use illegal drugs. Some items may  interact with your medicine. What should I watch for while using this medication? Your condition will be monitored carefully while you are receiving this medication. This medication may make you feel generally unwell. This is not uncommon as chemotherapy can affect healthy cells as well as cancer cells. Report any side effects. Continue your course of treatment even though you feel ill unless your care team tells you to stop. In some cases, you may be given additional medications to help with side effects. Follow all directions for their use. This medication may increase your risk of getting an infection. Call your care team for advice if you get a fever, chills, sore throat, or other symptoms of a cold or flu. Do not treat yourself. Try to avoid being around people who are sick. This medication may increase your risk to bruise or bleed. Call your care team if you notice any unusual bleeding. Be careful brushing or flossing your teeth or using a toothpick because you may get an infection or bleed more easily. If you have any dental work done, tell your dentist you are receiving this medication. Avoid taking medications that contain aspirin, acetaminophen, ibuprofen, naproxen, or ketoprofen unless instructed by your care team. These medications may hide a fever. Do not treat diarrhea with over the counter products. Contact your care team if you have diarrhea that lasts more than 2 days or if it is severe and watery. This medication can make you more sensitive to the sun. Keep out of the sun. If you cannot avoid being in the sun, wear protective clothing and sunscreen. Do not use sun lamps, tanning beds, or tanning booths. Talk to   your care team if you or your partner wish to become pregnant or think you might be pregnant. This medication can cause serious birth defects if taken during pregnancy and for 3 months after the last dose. A reliable form of contraception is recommended while taking this  medication and for 3 months after the last dose. Talk to your care team about effective forms of contraception. Do not father a child while taking this medication and for 3 months after the last dose. Use a condom while having sex during this time period. Do not breastfeed while taking this medication. This medication may cause infertility. Talk to your care team if you are concerned about your fertility. What side effects may I notice from receiving this medication? Side effects that you should report to your care team as soon as possible: Allergic reactions--skin rash, itching, hives, swelling of the face, lips, tongue, or throat Heart attack--pain or tightness in the chest, shoulders, arms, or jaw, nausea, shortness of breath, cold or clammy skin, feeling faint or lightheaded Heart failure--shortness of breath, swelling of the ankles, feet, or hands, sudden weight gain, unusual weakness or fatigue Heart rhythm changes--fast or irregular heartbeat, dizziness, feeling faint or lightheaded, chest pain, trouble breathing High ammonia level--unusual weakness or fatigue, confusion, loss of appetite, nausea, vomiting, seizures Infection--fever, chills, cough, sore throat, wounds that don't heal, pain or trouble when passing urine, general feeling of discomfort or being unwell Low red blood cell level--unusual weakness or fatigue, dizziness, headache, trouble breathing Pain, tingling, or numbness in the hands or feet, muscle weakness, change in vision, confusion or trouble speaking, loss of balance or coordination, trouble walking, seizures Redness, swelling, and blistering of the skin over hands and feet Severe or prolonged diarrhea Unusual bruising or bleeding Side effects that usually do not require medical attention (report to your care team if they continue or are bothersome): Dry skin Headache Increased tears Nausea Pain, redness, or swelling with sores inside the mouth or throat Sensitivity  to light Vomiting This list may not describe all possible side effects. Call your doctor for medical advice about side effects. You may report side effects to FDA at 1-800-FDA-1088. Where should I keep my medication? This medication is given in a hospital or clinic. It will not be stored at home. NOTE: This sheet is a summary. It may not cover all possible information. If you have questions about this medicine, talk to your doctor, pharmacist, or health care provider.  2023 Elsevier/Gold Standard (2021-09-16 00:00:00) Oxaliplatin Injection What is this medication? OXALIPLATIN (ox AL i PLA tin) treats colorectal cancer. It works by slowing down the growth of cancer cells. This medicine may be used for other purposes; ask your health care provider or pharmacist if you have questions. COMMON BRAND NAME(S): Eloxatin What should I tell my care team before I take this medication? They need to know if you have any of these conditions: Heart disease History of irregular heartbeat or rhythm Liver disease Low blood cell levels (white cells, red cells, and platelets) Lung or breathing disease, such as asthma Take medications that treat or prevent blood clots Tingling of the fingers, toes, or other nerve disorder An unusual or allergic reaction to oxaliplatin, other medications, foods, dyes, or preservatives If you or your partner are pregnant or trying to get pregnant Breast-feeding How should I use this medication? This medication is injected into a vein. It is given by your care team in a hospital or clinic setting. Talk to your care   team about the use of this medication in children. Special care may be needed. Overdosage: If you think you have taken too much of this medicine contact a poison control center or emergency room at once. NOTE: This medicine is only for you. Do not share this medicine with others. What if I miss a dose? Keep appointments for follow-up doses. It is important not to  miss a dose. Call your care team if you are unable to keep an appointment. What may interact with this medication? Do not take this medication with any of the following: Cisapride Dronedarone Pimozide Thioridazine This medication may also interact with the following: Aspirin and aspirin-like medications Certain medications that treat or prevent blood clots, such as warfarin, apixaban, dabigatran, and rivaroxaban Cisplatin Cyclosporine Diuretics Medications for infection, such as acyclovir, adefovir, amphotericin B, bacitracin, cidofovir, foscarnet, ganciclovir, gentamicin, pentamidine, vancomycin NSAIDs, medications for pain and inflammation, such as ibuprofen or naproxen Other medications that cause heart rhythm changes Pamidronate Zoledronic acid This list may not describe all possible interactions. Give your health care provider a list of all the medicines, herbs, non-prescription drugs, or dietary supplements you use. Also tell them if you smoke, drink alcohol, or use illegal drugs. Some items may interact with your medicine. What should I watch for while using this medication? Your condition will be monitored carefully while you are receiving this medication. You may need blood work while taking this medication. This medication may make you feel generally unwell. This is not uncommon as chemotherapy can affect healthy cells as well as cancer cells. Report any side effects. Continue your course of treatment even though you feel ill unless your care team tells you to stop. This medication may increase your risk of getting an infection. Call your care team for advice if you get a fever, chills, sore throat, or other symptoms of a cold or flu. Do not treat yourself. Try to avoid being around people who are sick. Avoid taking medications that contain aspirin, acetaminophen, ibuprofen, naproxen, or ketoprofen unless instructed by your care team. These medications may hide a fever. Be careful  brushing or flossing your teeth or using a toothpick because you may get an infection or bleed more easily. If you have any dental work done, tell your dentist you are receiving this medication. This medication can make you more sensitive to cold. Do not drink cold drinks or use ice. Cover exposed skin before coming in contact with cold temperatures or cold objects. When out in cold weather wear warm clothing and cover your mouth and nose to warm the air that goes into your lungs. Tell your care team if you get sensitive to the cold. Talk to your care team if you or your partner are pregnant or think either of you might be pregnant. This medication can cause serious birth defects if taken during pregnancy and for 9 months after the last dose. A negative pregnancy test is required before starting this medication. A reliable form of contraception is recommended while taking this medication and for 9 months after the last dose. Talk to your care team about effective forms of contraception. Do not father a child while taking this medication and for 6 months after the last dose. Use a condom while having sex during this time period. Do not breastfeed while taking this medication and for 3 months after the last dose. This medication may cause infertility. Talk to your care team if you are concerned about your fertility. What side effects may  I notice from receiving this medication? Side effects that you should report to your care team as soon as possible: Allergic reactions--skin rash, itching, hives, swelling of the face, lips, tongue, or throat Bleeding--bloody or black, tar-like stools, vomiting blood or brown material that looks like coffee grounds, red or dark brown urine, small red or purple spots on skin, unusual bruising or bleeding Dry cough, shortness of breath or trouble breathing Heart rhythm changes--fast or irregular heartbeat, dizziness, feeling faint or lightheaded, chest pain, trouble  breathing Infection--fever, chills, cough, sore throat, wounds that don't heal, pain or trouble when passing urine, general feeling of discomfort or being unwell Liver injury--right upper belly pain, loss of appetite, nausea, light-colored stool, dark yellow or brown urine, yellowing skin or eyes, unusual weakness or fatigue Low red blood cell level--unusual weakness or fatigue, dizziness, headache, trouble breathing Muscle injury--unusual weakness or fatigue, muscle pain, dark yellow or brown urine, decrease in amount of urine Pain, tingling, or numbness in the hands or feet Sudden and severe headache, confusion, change in vision, seizures, which may be signs of posterior reversible encephalopathy syndrome (PRES) Unusual bruising or bleeding Side effects that usually do not require medical attention (report to your care team if they continue or are bothersome): Diarrhea Nausea Pain, redness, or swelling with sores inside the mouth or throat Unusual weakness or fatigue Vomiting This list may not describe all possible side effects. Call your doctor for medical advice about side effects. You may report side effects to FDA at 1-800-FDA-1088. Where should I keep my medication? This medication is given in a hospital or clinic. It will not be stored at home. NOTE: This sheet is a summary. It may not cover all possible information. If you have questions about this medicine, talk to your doctor, pharmacist, or health care provider.  2023 Elsevier/Gold Standard (2007-07-09 00:00:00)

## 2022-10-02 ENCOUNTER — Inpatient Hospital Stay: Payer: 59

## 2022-10-02 VITALS — BP 121/72 | HR 95 | Temp 97.9°F | Resp 18 | Wt 147.0 lb

## 2022-10-02 DIAGNOSIS — C189 Malignant neoplasm of colon, unspecified: Secondary | ICD-10-CM | POA: Diagnosis not present

## 2022-10-02 DIAGNOSIS — C182 Malignant neoplasm of ascending colon: Secondary | ICD-10-CM

## 2022-10-02 DIAGNOSIS — Z79630 Long term (current) use of alkylating agent: Secondary | ICD-10-CM | POA: Diagnosis not present

## 2022-10-02 DIAGNOSIS — Z79631 Long term (current) use of antimetabolite agent: Secondary | ICD-10-CM | POA: Diagnosis not present

## 2022-10-02 DIAGNOSIS — Z5111 Encounter for antineoplastic chemotherapy: Secondary | ICD-10-CM | POA: Diagnosis not present

## 2022-10-02 DIAGNOSIS — C7971 Secondary malignant neoplasm of right adrenal gland: Secondary | ICD-10-CM | POA: Diagnosis not present

## 2022-10-02 DIAGNOSIS — C7802 Secondary malignant neoplasm of left lung: Secondary | ICD-10-CM | POA: Diagnosis not present

## 2022-10-02 DIAGNOSIS — Z5189 Encounter for other specified aftercare: Secondary | ICD-10-CM | POA: Diagnosis not present

## 2022-10-02 DIAGNOSIS — C786 Secondary malignant neoplasm of retroperitoneum and peritoneum: Secondary | ICD-10-CM | POA: Diagnosis not present

## 2022-10-02 DIAGNOSIS — C7931 Secondary malignant neoplasm of brain: Secondary | ICD-10-CM | POA: Diagnosis not present

## 2022-10-02 DIAGNOSIS — C7801 Secondary malignant neoplasm of right lung: Secondary | ICD-10-CM | POA: Diagnosis not present

## 2022-10-02 DIAGNOSIS — C787 Secondary malignant neoplasm of liver and intrahepatic bile duct: Secondary | ICD-10-CM | POA: Diagnosis not present

## 2022-10-02 MED ORDER — HEPARIN SOD (PORK) LOCK FLUSH 100 UNIT/ML IV SOLN
500.0000 [IU] | Freq: Once | INTRAVENOUS | Status: AC | PRN
  Administered 2022-10-02: 500 [IU]

## 2022-10-02 MED ORDER — SODIUM CHLORIDE 0.9% FLUSH
10.0000 mL | INTRAVENOUS | Status: DC | PRN
  Administered 2022-10-02: 10 mL

## 2022-10-02 NOTE — Patient Instructions (Signed)
Fluorouracil Injection What is this medication? FLUOROURACIL (flure oh YOOR a sil) treats some types of cancer. It works by slowing down the growth of cancer cells. This medicine may be used for other purposes; ask your health care provider or pharmacist if you have questions. COMMON BRAND NAME(S): Adrucil What should I tell my care team before I take this medication? They need to know if you have any of these conditions: Blood disorders Dihydropyrimidine dehydrogenase (DPD) deficiency Infection, such as chickenpox, cold sores, herpes Kidney disease Liver disease Poor nutrition Recent or ongoing radiation therapy An unusual or allergic reaction to fluorouracil, other medications, foods, dyes, or preservatives If you or your partner are pregnant or trying to get pregnant Breast-feeding How should I use this medication? This medication is injected into a vein. It is administered by your care team in a hospital or clinic setting. Talk to your care team about the use of this medication in children. Special care may be needed. Overdosage: If you think you have taken too much of this medicine contact a poison control center or emergency room at once. NOTE: This medicine is only for you. Do not share this medicine with others. What if I miss a dose? Keep appointments for follow-up doses. It is important not to miss your dose. Call your care team if you are unable to keep an appointment. What may interact with this medication? Do not take this medication with any of the following: Live virus vaccines This medication may also interact with the following: Medications that treat or prevent blood clots, such as warfarin, enoxaparin, dalteparin This list may not describe all possible interactions. Give your health care provider a list of all the medicines, herbs, non-prescription drugs, or dietary supplements you use. Also tell them if you smoke, drink alcohol, or use illegal drugs. Some items may  interact with your medicine. What should I watch for while using this medication? Your condition will be monitored carefully while you are receiving this medication. This medication may make you feel generally unwell. This is not uncommon as chemotherapy can affect healthy cells as well as cancer cells. Report any side effects. Continue your course of treatment even though you feel ill unless your care team tells you to stop. In some cases, you may be given additional medications to help with side effects. Follow all directions for their use. This medication may increase your risk of getting an infection. Call your care team for advice if you get a fever, chills, sore throat, or other symptoms of a cold or flu. Do not treat yourself. Try to avoid being around people who are sick. This medication may increase your risk to bruise or bleed. Call your care team if you notice any unusual bleeding. Be careful brushing or flossing your teeth or using a toothpick because you may get an infection or bleed more easily. If you have any dental work done, tell your dentist you are receiving this medication. Avoid taking medications that contain aspirin, acetaminophen, ibuprofen, naproxen, or ketoprofen unless instructed by your care team. These medications may hide a fever. Do not treat diarrhea with over the counter products. Contact your care team if you have diarrhea that lasts more than 2 days or if it is severe and watery. This medication can make you more sensitive to the sun. Keep out of the sun. If you cannot avoid being in the sun, wear protective clothing and sunscreen. Do not use sun lamps, tanning beds, or tanning booths. Talk to   your care team if you or your partner wish to become pregnant or think you might be pregnant. This medication can cause serious birth defects if taken during pregnancy and for 3 months after the last dose. A reliable form of contraception is recommended while taking this  medication and for 3 months after the last dose. Talk to your care team about effective forms of contraception. Do not father a child while taking this medication and for 3 months after the last dose. Use a condom while having sex during this time period. Do not breastfeed while taking this medication. This medication may cause infertility. Talk to your care team if you are concerned about your fertility. What side effects may I notice from receiving this medication? Side effects that you should report to your care team as soon as possible: Allergic reactions--skin rash, itching, hives, swelling of the face, lips, tongue, or throat Heart attack--pain or tightness in the chest, shoulders, arms, or jaw, nausea, shortness of breath, cold or clammy skin, feeling faint or lightheaded Heart failure--shortness of breath, swelling of the ankles, feet, or hands, sudden weight gain, unusual weakness or fatigue Heart rhythm changes--fast or irregular heartbeat, dizziness, feeling faint or lightheaded, chest pain, trouble breathing High ammonia level--unusual weakness or fatigue, confusion, loss of appetite, nausea, vomiting, seizures Infection--fever, chills, cough, sore throat, wounds that don't heal, pain or trouble when passing urine, general feeling of discomfort or being unwell Low red blood cell level--unusual weakness or fatigue, dizziness, headache, trouble breathing Pain, tingling, or numbness in the hands or feet, muscle weakness, change in vision, confusion or trouble speaking, loss of balance or coordination, trouble walking, seizures Redness, swelling, and blistering of the skin over hands and feet Severe or prolonged diarrhea Unusual bruising or bleeding Side effects that usually do not require medical attention (report to your care team if they continue or are bothersome): Dry skin Headache Increased tears Nausea Pain, redness, or swelling with sores inside the mouth or throat Sensitivity  to light Vomiting This list may not describe all possible side effects. Call your doctor for medical advice about side effects. You may report side effects to FDA at 1-800-FDA-1088. Where should I keep my medication? This medication is given in a hospital or clinic. It will not be stored at home. NOTE: This sheet is a summary. It may not cover all possible information. If you have questions about this medicine, talk to your doctor, pharmacist, or health care provider.  2023 Elsevier/Gold Standard (2021-09-16 00:00:00) Oxaliplatin Injection What is this medication? OXALIPLATIN (ox AL i PLA tin) treats colorectal cancer. It works by slowing down the growth of cancer cells. This medicine may be used for other purposes; ask your health care provider or pharmacist if you have questions. COMMON BRAND NAME(S): Eloxatin What should I tell my care team before I take this medication? They need to know if you have any of these conditions: Heart disease History of irregular heartbeat or rhythm Liver disease Low blood cell levels (white cells, red cells, and platelets) Lung or breathing disease, such as asthma Take medications that treat or prevent blood clots Tingling of the fingers, toes, or other nerve disorder An unusual or allergic reaction to oxaliplatin, other medications, foods, dyes, or preservatives If you or your partner are pregnant or trying to get pregnant Breast-feeding How should I use this medication? This medication is injected into a vein. It is given by your care team in a hospital or clinic setting. Talk to your care   team about the use of this medication in children. Special care may be needed. Overdosage: If you think you have taken too much of this medicine contact a poison control center or emergency room at once. NOTE: This medicine is only for you. Do not share this medicine with others. What if I miss a dose? Keep appointments for follow-up doses. It is important not to  miss a dose. Call your care team if you are unable to keep an appointment. What may interact with this medication? Do not take this medication with any of the following: Cisapride Dronedarone Pimozide Thioridazine This medication may also interact with the following: Aspirin and aspirin-like medications Certain medications that treat or prevent blood clots, such as warfarin, apixaban, dabigatran, and rivaroxaban Cisplatin Cyclosporine Diuretics Medications for infection, such as acyclovir, adefovir, amphotericin B, bacitracin, cidofovir, foscarnet, ganciclovir, gentamicin, pentamidine, vancomycin NSAIDs, medications for pain and inflammation, such as ibuprofen or naproxen Other medications that cause heart rhythm changes Pamidronate Zoledronic acid This list may not describe all possible interactions. Give your health care provider a list of all the medicines, herbs, non-prescription drugs, or dietary supplements you use. Also tell them if you smoke, drink alcohol, or use illegal drugs. Some items may interact with your medicine. What should I watch for while using this medication? Your condition will be monitored carefully while you are receiving this medication. You may need blood work while taking this medication. This medication may make you feel generally unwell. This is not uncommon as chemotherapy can affect healthy cells as well as cancer cells. Report any side effects. Continue your course of treatment even though you feel ill unless your care team tells you to stop. This medication may increase your risk of getting an infection. Call your care team for advice if you get a fever, chills, sore throat, or other symptoms of a cold or flu. Do not treat yourself. Try to avoid being around people who are sick. Avoid taking medications that contain aspirin, acetaminophen, ibuprofen, naproxen, or ketoprofen unless instructed by your care team. These medications may hide a fever. Be careful  brushing or flossing your teeth or using a toothpick because you may get an infection or bleed more easily. If you have any dental work done, tell your dentist you are receiving this medication. This medication can make you more sensitive to cold. Do not drink cold drinks or use ice. Cover exposed skin before coming in contact with cold temperatures or cold objects. When out in cold weather wear warm clothing and cover your mouth and nose to warm the air that goes into your lungs. Tell your care team if you get sensitive to the cold. Talk to your care team if you or your partner are pregnant or think either of you might be pregnant. This medication can cause serious birth defects if taken during pregnancy and for 9 months after the last dose. A negative pregnancy test is required before starting this medication. A reliable form of contraception is recommended while taking this medication and for 9 months after the last dose. Talk to your care team about effective forms of contraception. Do not father a child while taking this medication and for 6 months after the last dose. Use a condom while having sex during this time period. Do not breastfeed while taking this medication and for 3 months after the last dose. This medication may cause infertility. Talk to your care team if you are concerned about your fertility. What side effects may   I notice from receiving this medication? Side effects that you should report to your care team as soon as possible: Allergic reactions--skin rash, itching, hives, swelling of the face, lips, tongue, or throat Bleeding--bloody or black, tar-like stools, vomiting blood or brown material that looks like coffee grounds, red or dark brown urine, small red or purple spots on skin, unusual bruising or bleeding Dry cough, shortness of breath or trouble breathing Heart rhythm changes--fast or irregular heartbeat, dizziness, feeling faint or lightheaded, chest pain, trouble  breathing Infection--fever, chills, cough, sore throat, wounds that don't heal, pain or trouble when passing urine, general feeling of discomfort or being unwell Liver injury--right upper belly pain, loss of appetite, nausea, light-colored stool, dark yellow or brown urine, yellowing skin or eyes, unusual weakness or fatigue Low red blood cell level--unusual weakness or fatigue, dizziness, headache, trouble breathing Muscle injury--unusual weakness or fatigue, muscle pain, dark yellow or brown urine, decrease in amount of urine Pain, tingling, or numbness in the hands or feet Sudden and severe headache, confusion, change in vision, seizures, which may be signs of posterior reversible encephalopathy syndrome (PRES) Unusual bruising or bleeding Side effects that usually do not require medical attention (report to your care team if they continue or are bothersome): Diarrhea Nausea Pain, redness, or swelling with sores inside the mouth or throat Unusual weakness or fatigue Vomiting This list may not describe all possible side effects. Call your doctor for medical advice about side effects. You may report side effects to FDA at 1-800-FDA-1088. Where should I keep my medication? This medication is given in a hospital or clinic. It will not be stored at home. NOTE: This sheet is a summary. It may not cover all possible information. If you have questions about this medicine, talk to your doctor, pharmacist, or health care provider.  2023 Elsevier/Gold Standard (2007-07-09 00:00:00)  

## 2022-10-06 ENCOUNTER — Other Ambulatory Visit: Payer: Self-pay | Admitting: Psychiatry

## 2022-10-06 DIAGNOSIS — F333 Major depressive disorder, recurrent, severe with psychotic symptoms: Secondary | ICD-10-CM

## 2022-10-08 ENCOUNTER — Ambulatory Visit: Payer: 59 | Admitting: Psychiatry

## 2022-10-11 NOTE — Progress Notes (Unsigned)
[facility]  [address] [place],  [place]  [address] [phone]  Clinic Day:  10/12/2022  Referring physician: Weston Settle, MD  HISTORY OF PRESENT ILLNESS:  The patient is a 57 y.o. female with  metastatic colon cancer, which includes spread of disease to her abdominal cavity, lungs, brain and right adrenal gland. Recent CT scans at [facility] showed 3 liver metastases.   She comes in today to be evaluated before heading into her 4th cycle of FOLFOX/Avastin.  The patient claims to have tolerated her 3rd cycle of this chemotherapy regimen fairly well.  She did have less nausea with cycle #3 of treatment.  Her daily quality of life remains great.  She denies having any new GI symptoms or findings which concern her for overt signs of disease progression.   With respect to her colon cancer history, she is status post a right hemicolectomy in early October 2018, followed by 12 cycles of adjuvant FOLFOX chemotherapy, which were completed in April 2019.  In December 2021, she underwent a left cerebellar metastasectomy, whose pathology was consistent with metastatic colon cancer.  Tumor testing did come back MMR normal. CT scans revealed evidence of her cancer being in multiple locations, for which she took 40 cycles of FOLFIRI/Avastin.  Recent scans have just revealed liver metastasis, please have her back on FOLFOX/Avastin.     PHYSICAL EXAM:  Blood pressure (!) 122/90, pulse 100, temperature 98.5 F (36.9 C), temperature source Oral, resp. rate 18, height 5\' 8"  (1.727 m), weight 149 lb 1.6 oz (67.6 kg), last menstrual period 02/22/2019, SpO2 96 %. Body mass index is 22.67 kg/m.  Performance status (ECOG): 1 - Symptomatic but completely ambulatory  Physical Exam Vitals and nursing note reviewed.  Constitutional:      General: She is not in acute distress.    Appearance: Normal appearance.  HENT:     Head: Normocephalic and atraumatic.     Mouth/Throat:      Mouth: Mucous membranes are moist.     Pharynx: Oropharynx is clear. No oropharyngeal exudate or posterior oropharyngeal erythema.  Eyes:     General: No scleral icterus.    Extraocular Movements: Extraocular movements intact.     Conjunctiva/sclera: Conjunctivae normal.     Pupils: Pupils are equal, round, and reactive to light.  Cardiovascular:     Rate and Rhythm: Normal rate and regular rhythm.     Heart sounds: Normal heart sounds. No murmur heard.    No friction rub. No gallop.  Pulmonary:     Effort: Pulmonary effort is normal.     Breath sounds: Normal breath sounds. No wheezing, rhonchi or rales.  Abdominal:     General: There is no distension.     Palpations: Abdomen is soft. There is no hepatomegaly, splenomegaly or mass.     Tenderness: There is no abdominal tenderness.  Musculoskeletal:        General: Normal range of motion.     Cervical back: Normal range of motion and neck supple. No tenderness.     Right lower leg: No edema.     Left lower leg: No edema.  Lymphadenopathy:     Cervical: No cervical adenopathy.     Upper Body:     Right upper body: No supraclavicular or axillary adenopathy.     Left upper body: No supraclavicular or axillary adenopathy.     Lower Body: No right inguinal adenopathy. No left inguinal adenopathy.  Skin:  General: Skin is warm and dry.     Coloration: Skin is not jaundiced.     Findings: No rash.  Neurological:     Mental Status: She is alert and oriented to person, place, and time.     Cranial Nerves: No cranial nerve deficit.  Psychiatric:        Mood and Affect: Mood normal.        Behavior: Behavior normal.        Thought Content: Thought content normal.    LABS:       Latest Ref Rng & Units 10/12/2022   12:00 AM 09/28/2022   12:00 AM 09/14/2022   12:00 AM  CBC  WBC  4.5     3.9     4.8      Hemoglobin 12.0 - 16.0 12.4     12.8     13.0      Hematocrit 36 - 46 37     39     39      Platelets 150 - 400 K/uL 84      98     118         This result is from an external source.      Latest Ref Rng & Units 10/12/2022    1:37 PM 09/28/2022    2:18 PM 09/14/2022    2:16 PM  CMP  Glucose 70 - 99 mg/dL 77  93  161   BUN 6 - 20 mg/dL 15  16  19    Creatinine 0.44 - 1.00 mg/dL 0.96  0.45  4.09   Sodium 135 - 145 mmol/L 141  140  139   Potassium 3.5 - 5.1 mmol/L 3.5  4.3  3.9   Chloride 98 - 111 mmol/L 107  104  105   CO2 22 - 32 mmol/L 23  25  24    Calcium 8.9 - 10.3 mg/dL 9.1  9.2  9.3   Total Protein 6.5 - 8.1 g/dL 6.7  6.6  7.0   Total Bilirubin 0.3 - 1.2 mg/dL 0.5  0.5  0.6   Alkaline Phos 38 - 126 U/L 102  92  98   AST 15 - 41 U/L 26  24  22    ALT 0 - 44 U/L 22  21  18     ASSESSMENT & PLAN:  Assessment/Plan:  A 57 y.o. female with metastatic colon cancer.  He will proceed with her 4th cycle of FOLFOX/Avastin this week.  Clinically, the patient appears to be doing very well.  I will see her back in 2 weeks before she heads into her 5th cycle of FOLFOX/Avastin.   CT scans will be done a day before her next visit to ascertain her new disease baseline after 4 cycles of treatment.  The patient understands all the plans discussed today and is in agreement with them.  Elisandra Deshmukh Kirby Funk, MD

## 2022-10-12 ENCOUNTER — Inpatient Hospital Stay: Payer: 59

## 2022-10-12 ENCOUNTER — Inpatient Hospital Stay (HOSPITAL_BASED_OUTPATIENT_CLINIC_OR_DEPARTMENT_OTHER): Payer: 59 | Admitting: Oncology

## 2022-10-12 ENCOUNTER — Other Ambulatory Visit: Payer: Self-pay | Admitting: Oncology

## 2022-10-12 DIAGNOSIS — C7800 Secondary malignant neoplasm of unspecified lung: Secondary | ICD-10-CM | POA: Diagnosis not present

## 2022-10-12 DIAGNOSIS — C182 Malignant neoplasm of ascending colon: Secondary | ICD-10-CM

## 2022-10-12 DIAGNOSIS — C189 Malignant neoplasm of colon, unspecified: Secondary | ICD-10-CM

## 2022-10-12 DIAGNOSIS — Z5111 Encounter for antineoplastic chemotherapy: Secondary | ICD-10-CM | POA: Diagnosis not present

## 2022-10-12 DIAGNOSIS — Z79630 Long term (current) use of alkylating agent: Secondary | ICD-10-CM | POA: Diagnosis not present

## 2022-10-12 DIAGNOSIS — C7931 Secondary malignant neoplasm of brain: Secondary | ICD-10-CM | POA: Diagnosis not present

## 2022-10-12 DIAGNOSIS — Z79631 Long term (current) use of antimetabolite agent: Secondary | ICD-10-CM | POA: Diagnosis not present

## 2022-10-12 DIAGNOSIS — C787 Secondary malignant neoplasm of liver and intrahepatic bile duct: Secondary | ICD-10-CM

## 2022-10-12 DIAGNOSIS — C7802 Secondary malignant neoplasm of left lung: Secondary | ICD-10-CM | POA: Diagnosis not present

## 2022-10-12 DIAGNOSIS — C7801 Secondary malignant neoplasm of right lung: Secondary | ICD-10-CM | POA: Diagnosis not present

## 2022-10-12 DIAGNOSIS — C786 Secondary malignant neoplasm of retroperitoneum and peritoneum: Secondary | ICD-10-CM | POA: Diagnosis not present

## 2022-10-12 DIAGNOSIS — Z5189 Encounter for other specified aftercare: Secondary | ICD-10-CM | POA: Diagnosis not present

## 2022-10-12 DIAGNOSIS — C7971 Secondary malignant neoplasm of right adrenal gland: Secondary | ICD-10-CM | POA: Diagnosis not present

## 2022-10-12 LAB — CMP (CANCER CENTER ONLY)
ALT: 22 U/L (ref 0–44)
AST: 26 U/L (ref 15–41)
Albumin: 3.7 g/dL (ref 3.5–5.0)
Alkaline Phosphatase: 102 U/L (ref 38–126)
Anion gap: 11 (ref 5–15)
BUN: 15 mg/dL (ref 6–20)
CO2: 23 mmol/L (ref 22–32)
Calcium: 9.1 mg/dL (ref 8.9–10.3)
Chloride: 107 mmol/L (ref 98–111)
Creatinine: 1.35 mg/dL — ABNORMAL HIGH (ref 0.44–1.00)
GFR, Estimated: 47 mL/min — ABNORMAL LOW (ref >60)
Glucose, Bld: 77 mg/dL (ref 70–99)
Potassium: 3.5 mmol/L (ref 3.5–5.1)
Sodium: 141 mmol/L (ref 135–145)
Total Bilirubin: 0.5 mg/dL (ref 0.3–1.2)
Total Protein: 6.7 g/dL (ref 6.5–8.1)

## 2022-10-12 LAB — CBC AND DIFFERENTIAL
HCT: 37 (ref 36–46)
Hemoglobin: 12.4 (ref 12.0–16.0)
Neutrophils Absolute: 2.3
Platelets: 84 10*3/uL — AB (ref 150–400)
WBC: 4.5

## 2022-10-12 LAB — CBC W DIFFERENTIAL (~~LOC~~ CC SCANNED REPORT)

## 2022-10-12 LAB — CBC: RBC: 3.97 (ref 3.87–5.11)

## 2022-10-13 ENCOUNTER — Encounter: Payer: Self-pay | Admitting: Oncology

## 2022-10-13 MED FILL — Fosaprepitant Dimeglumine For IV Infusion 150 MG (Base Eq): INTRAVENOUS | Qty: 5 | Status: AC

## 2022-10-13 MED FILL — Fluorouracil IV Soln 2.5 GM/50ML (50 MG/ML): INTRAVENOUS | Qty: 14 | Status: AC

## 2022-10-14 ENCOUNTER — Telehealth: Payer: Self-pay | Admitting: Oncology

## 2022-10-14 ENCOUNTER — Inpatient Hospital Stay: Payer: 59

## 2022-10-14 VITALS — BP 123/86 | HR 88 | Temp 97.5°F | Resp 18 | Ht 68.0 in | Wt 149.8 lb

## 2022-10-14 DIAGNOSIS — C7801 Secondary malignant neoplasm of right lung: Secondary | ICD-10-CM | POA: Diagnosis not present

## 2022-10-14 DIAGNOSIS — C7971 Secondary malignant neoplasm of right adrenal gland: Secondary | ICD-10-CM | POA: Diagnosis not present

## 2022-10-14 DIAGNOSIS — C787 Secondary malignant neoplasm of liver and intrahepatic bile duct: Secondary | ICD-10-CM | POA: Diagnosis not present

## 2022-10-14 DIAGNOSIS — C786 Secondary malignant neoplasm of retroperitoneum and peritoneum: Secondary | ICD-10-CM | POA: Diagnosis not present

## 2022-10-14 DIAGNOSIS — Z79631 Long term (current) use of antimetabolite agent: Secondary | ICD-10-CM | POA: Diagnosis not present

## 2022-10-14 DIAGNOSIS — C189 Malignant neoplasm of colon, unspecified: Secondary | ICD-10-CM

## 2022-10-14 DIAGNOSIS — Z5111 Encounter for antineoplastic chemotherapy: Secondary | ICD-10-CM | POA: Diagnosis not present

## 2022-10-14 DIAGNOSIS — Z5189 Encounter for other specified aftercare: Secondary | ICD-10-CM | POA: Diagnosis not present

## 2022-10-14 DIAGNOSIS — C182 Malignant neoplasm of ascending colon: Secondary | ICD-10-CM

## 2022-10-14 DIAGNOSIS — C7931 Secondary malignant neoplasm of brain: Secondary | ICD-10-CM | POA: Diagnosis not present

## 2022-10-14 DIAGNOSIS — Z79630 Long term (current) use of alkylating agent: Secondary | ICD-10-CM | POA: Diagnosis not present

## 2022-10-14 DIAGNOSIS — C7802 Secondary malignant neoplasm of left lung: Secondary | ICD-10-CM | POA: Diagnosis not present

## 2022-10-14 MED ORDER — SODIUM CHLORIDE 0.9 % IV SOLN: Freq: Once | INTRAVENOUS | Status: AC

## 2022-10-14 MED ORDER — DEXTROSE 5 % IV SOLN: Freq: Once | INTRAVENOUS | Status: AC

## 2022-10-14 MED ORDER — FAMOTIDINE IN NACL 20-0.9 MG/50ML-% IV SOLN
20.0000 mg | Freq: Once | INTRAVENOUS | Status: AC
  Administered 2022-10-14: 20 mg via INTRAVENOUS
  Filled 2022-10-14: qty 50

## 2022-10-14 MED ORDER — SODIUM CHLORIDE 0.9 % IV SOLN
5.0000 mg/kg | Freq: Once | INTRAVENOUS | Status: AC
  Administered 2022-10-14: 350 mg via INTRAVENOUS
  Filled 2022-10-14: qty 14

## 2022-10-14 MED ORDER — PALONOSETRON HCL INJECTION 0.25 MG/5ML
0.2500 mg | Freq: Once | INTRAVENOUS | Status: AC
  Administered 2022-10-14: 0.25 mg via INTRAVENOUS
  Filled 2022-10-14: qty 5

## 2022-10-14 MED ORDER — DIPHENHYDRAMINE HCL 50 MG/ML IJ SOLN
25.0000 mg | Freq: Once | INTRAMUSCULAR | Status: AC
  Administered 2022-10-14: 25 mg via INTRAVENOUS
  Filled 2022-10-14: qty 1

## 2022-10-14 MED ORDER — SODIUM CHLORIDE 0.9 % IV SOLN
2400.0000 mg/m2 | INTRAVENOUS | Status: DC
  Administered 2022-10-14: 4350 mg via INTRAVENOUS
  Filled 2022-10-14: qty 87

## 2022-10-14 MED ORDER — SODIUM CHLORIDE 0.9 % IV SOLN
150.0000 mg | Freq: Once | INTRAVENOUS | Status: AC
  Administered 2022-10-14: 150 mg via INTRAVENOUS
  Filled 2022-10-14: qty 150

## 2022-10-14 MED ORDER — OXALIPLATIN CHEMO INJECTION 100 MG/20ML
85.0000 mg/m2 | Freq: Once | INTRAVENOUS | Status: AC
  Administered 2022-10-14: 150 mg via INTRAVENOUS
  Filled 2022-10-14: qty 20

## 2022-10-14 MED ORDER — FLUOROURACIL CHEMO INJECTION 2.5 GM/50ML
400.0000 mg/m2 | Freq: Once | INTRAVENOUS | Status: AC
  Administered 2022-10-14: 700 mg via INTRAVENOUS
  Filled 2022-10-14: qty 14

## 2022-10-14 MED ORDER — SODIUM CHLORIDE 0.9 % IV SOLN
10.0000 mg | Freq: Once | INTRAVENOUS | Status: AC
  Administered 2022-10-14: 10 mg via INTRAVENOUS
  Filled 2022-10-14: qty 10

## 2022-10-14 NOTE — Telephone Encounter (Signed)
10/14/22 Left msg-CT SCANS on 10/22/22 arrive at 10am.Appt at 11am

## 2022-10-14 NOTE — Patient Instructions (Signed)
Leucovorin Injection What is this medication? LEUCOVORIN (loo koe VOR in) prevents side effects from certain medications, such as methotrexate. It works by increasing folate levels. This helps protect healthy cells in your body. It may also be used to treat anemia caused by low levels of folate. It can also be used with fluorouracil, a type of chemotherapy, to treat colorectal cancer. It works by increasing the effects of fluorouracil in the body. This medicine may be used for other purposes; ask your health care provider or pharmacist if you have questions. What should I tell my care team before I take this medication? They need to know if you have any of these conditions: Anemia from low levels of vitamin B12 in the blood An unusual or allergic reaction to leucovorin, folic acid, other medications, foods, dyes, or preservatives Pregnant or trying to get pregnant Breastfeeding How should I use this medication? This medication is injected into a vein or a muscle. It is given by your care team in a hospital or clinic setting. Talk to your care team about the use of this medication in children. Special care may be needed. Overdosage: If you think you have taken too much of this medicine contact a poison control center or emergency room at once. NOTE: This medicine is only for you. Do not share this medicine with others. What if I miss a dose? Keep appointments for follow-up doses. It is important not to miss your dose. Call your care team if you are unable to keep an appointment. What may interact with this medication? Capecitabine Fluorouracil Phenobarbital Phenytoin Primidone Trimethoprim;sulfamethoxazole This list may not describe all possible interactions. Give your health care provider a list of all the medicines, herbs, non-prescription drugs, or dietary supplements you use. Also tell them if you smoke, drink alcohol, or use illegal drugs. Some items may interact with your medicine. What  should I watch for while using this medication? Your condition will be monitored carefully while you are receiving this medication. This medication may increase the side effects of 5-fluorouracil. Tell your care team if you have diarrhea or mouth sores that do not get better or that get worse. What side effects may I notice from receiving this medication? Side effects that you should report to your care team as soon as possible: Allergic reactions--skin rash, itching, hives, swelling of the face, lips, tongue, or throat This list may not describe all possible side effects. Call your doctor for medical advice about side effects. You may report side effects to FDA at 1-800-FDA-1088. Where should I keep my medication? This medication is given in a hospital or clinic. It will not be stored at home. NOTE: This sheet is a summary. It may not cover all possible information. If you have questions about this medicine, talk to your doctor, pharmacist, or health care provider.  2023 Elsevier/Gold Standard (2021-09-26 00:00:00)  Oxaliplatin Injection What is this medication? OXALIPLATIN (ox AL i PLA tin) treats colorectal cancer. It works by slowing down the growth of cancer cells. This medicine may be used for other purposes; ask your health care provider or pharmacist if you have questions. COMMON BRAND NAME(S): Eloxatin What should I tell my care team before I take this medication? They need to know if you have any of these conditions: Heart disease History of irregular heartbeat or rhythm Liver disease Low blood cell levels (white cells, red cells, and platelets) Lung or breathing disease, such as asthma Take medications that treat or prevent blood clots Tingling  of the fingers, toes, or other nerve disorder An unusual or allergic reaction to oxaliplatin, other medications, foods, dyes, or preservatives If you or your partner are pregnant or trying to get pregnant Breast-feeding How should I  use this medication? This medication is injected into a vein. It is given by your care team in a hospital or clinic setting. Talk to your care team about the use of this medication in children. Special care may be needed. Overdosage: If you think you have taken too much of this medicine contact a poison control center or emergency room at once. NOTE: This medicine is only for you. Do not share this medicine with others. What if I miss a dose? Keep appointments for follow-up doses. It is important not to miss a dose. Call your care team if you are unable to keep an appointment. What may interact with this medication? Do not take this medication with any of the following: Cisapride Dronedarone Pimozide Thioridazine This medication may also interact with the following: Aspirin and aspirin-like medications Certain medications that treat or prevent blood clots, such as warfarin, apixaban, dabigatran, and rivaroxaban Cisplatin Cyclosporine Diuretics Medications for infection, such as acyclovir, adefovir, amphotericin B, bacitracin, cidofovir, foscarnet, ganciclovir, gentamicin, pentamidine, vancomycin NSAIDs, medications for pain and inflammation, such as ibuprofen or naproxen Other medications that cause heart rhythm changes Pamidronate Zoledronic acid This list may not describe all possible interactions. Give your health care provider a list of all the medicines, herbs, non-prescription drugs, or dietary supplements you use. Also tell them if you smoke, drink alcohol, or use illegal drugs. Some items may interact with your medicine. What should I watch for while using this medication? Your condition will be monitored carefully while you are receiving this medication. You may need blood work while taking this medication. This medication may make you feel generally unwell. This is not uncommon as chemotherapy can affect healthy cells as well as cancer cells. Report any side effects. Continue  your course of treatment even though you feel ill unless your care team tells you to stop. This medication may increase your risk of getting an infection. Call your care team for advice if you get a fever, chills, sore throat, or other symptoms of a cold or flu. Do not treat yourself. Try to avoid being around people who are sick. Avoid taking medications that contain aspirin, acetaminophen, ibuprofen, naproxen, or ketoprofen unless instructed by your care team. These medications may hide a fever. Be careful brushing or flossing your teeth or using a toothpick because you may get an infection or bleed more easily. If you have any dental work done, tell your dentist you are receiving this medication. This medication can make you more sensitive to cold. Do not drink cold drinks or use ice. Cover exposed skin before coming in contact with cold temperatures or cold objects. When out in cold weather wear warm clothing and cover your mouth and nose to warm the air that goes into your lungs. Tell your care team if you get sensitive to the cold. Talk to your care team if you or your partner are pregnant or think either of you might be pregnant. This medication can cause serious birth defects if taken during pregnancy and for 9 months after the last dose. A negative pregnancy test is required before starting this medication. A reliable form of contraception is recommended while taking this medication and for 9 months after the last dose. Talk to your care team about effective forms of  contraception. Do not father a child while taking this medication and for 6 months after the last dose. Use a condom while having sex during this time period. Do not breastfeed while taking this medication and for 3 months after the last dose. This medication may cause infertility. Talk to your care team if you are concerned about your fertility. What side effects may I notice from receiving this medication? Side effects that you  should report to your care team as soon as possible: Allergic reactions--skin rash, itching, hives, swelling of the face, lips, tongue, or throat Bleeding--bloody or black, tar-like stools, vomiting blood or brown material that looks like coffee grounds, red or dark brown urine, small red or purple spots on skin, unusual bruising or bleeding Dry cough, shortness of breath or trouble breathing Heart rhythm changes--fast or irregular heartbeat, dizziness, feeling faint or lightheaded, chest pain, trouble breathing Infection--fever, chills, cough, sore throat, wounds that don't heal, pain or trouble when passing urine, general feeling of discomfort or being unwell Liver injury--right upper belly pain, loss of appetite, nausea, light-colored stool, dark yellow or brown urine, yellowing skin or eyes, unusual weakness or fatigue Low red blood cell level--unusual weakness or fatigue, dizziness, headache, trouble breathing Muscle injury--unusual weakness or fatigue, muscle pain, dark yellow or brown urine, decrease in amount of urine Pain, tingling, or numbness in the hands or feet Sudden and severe headache, confusion, change in vision, seizures, which may be signs of posterior reversible encephalopathy syndrome (PRES) Unusual bruising or bleeding Side effects that usually do not require medical attention (report to your care team if they continue or are bothersome): Diarrhea Nausea Pain, redness, or swelling with sores inside the mouth or throat Unusual weakness or fatigue Vomiting This list may not describe all possible side effects. Call your doctor for medical advice about side effects. You may report side effects to FDA at 1-800-FDA-1088. Where should I keep my medication? This medication is given in a hospital or clinic. It will not be stored at home. NOTE: This sheet is a summary. It may not cover all possible information. If you have questions about this medicine, talk to your doctor,  pharmacist, or health care provider.  2023 Elsevier/Gold Standard (2007-07-09 00:00:00)  Bevacizumab Injection What is this medication? BEVACIZUMAB (be va SIZ yoo mab) treats some types of cancer. It works by blocking a protein that causes cancer cells to grow and multiply. This helps to slow or stop the spread of cancer cells. It is a monoclonal antibody. This medicine may be used for other purposes; ask your health care provider or pharmacist if you have questions. COMMON BRAND NAME(S): Alymsys, Avastin, MVASI, Omer Jack What should I tell my care team before I take this medication? They need to know if you have any of these conditions: Blood clots Coughing up blood Having or recent surgery Heart failure High blood pressure History of a connection between 2 or more body parts that do not usually connect (fistula) History of a tear in your stomach or intestines Protein in your urine An unusual or allergic reaction to bevacizumab, other medications, foods, dyes, or preservatives Pregnant or trying to get pregnant Breast-feeding How should I use this medication? This medication is injected into a vein. It is given by your care team in a hospital or clinic setting. Talk to your care team the use of this medication in children. Special care may be needed. Overdosage: If you think you have taken too much of this medicine contact a poison  control center or emergency room at once. NOTE: This medicine is only for you. Do not share this medicine with others. What if I miss a dose? Keep appointments for follow-up doses. It is important not to miss your dose. Call your care team if you are unable to keep an appointment. What may interact with this medication? Interactions are not expected. This list may not describe all possible interactions. Give your health care provider a list of all the medicines, herbs, non-prescription drugs, or dietary supplements you use. Also tell them if you smoke, drink  alcohol, or use illegal drugs. Some items may interact with your medicine. What should I watch for while using this medication? Your condition will be monitored carefully while you are receiving this medication. You may need blood work while taking this medication. This medication may make you feel generally unwell. This is not uncommon as chemotherapy can affect healthy cells as well as cancer cells. Report any side effects. Continue your course of treatment even though you feel ill unless your care team tells you to stop. This medication may increase your risk to bruise or bleed. Call your care team if you notice any unusual bleeding. Before having surgery, talk to your care team to make sure it is ok. This medication can increase the risk of poor healing of your surgical site or wound. You will need to stop this medication for 28 days before surgery. After surgery, wait at least 28 days before restarting this medication. Make sure the surgical site or wound is healed enough before restarting this medication. Talk to your care team if questions. Talk to your care team if you may be pregnant. Serious birth defects can occur if you take this medication during pregnancy and for 6 months after the last dose. Contraception is recommended while taking this medication and for 6 months after the last dose. Your care team can help you find the option that works for you. Do not breastfeed while taking this medication and for 6 months after the last dose. This medication can cause infertility. Talk to your care team if you are concerned about your fertility. What side effects may I notice from receiving this medication? Side effects that you should report to your care team as soon as possible: Allergic reactions--skin rash, itching, hives, swelling of the face, lips, tongue, or throat Bleeding--bloody or black, tar-like stools, vomiting blood or brown material that looks like coffee grounds, red or dark brown  urine, small red or purple spots on skin, unusual bruising or bleeding Blood clot--pain, swelling, or warmth in the leg, shortness of breath, chest pain Heart attack--pain or tightness in the chest, shoulders, arms, or jaw, nausea, shortness of breath, cold or clammy skin, feeling faint or lightheaded Heart failure--shortness of breath, swelling of the ankles, feet, or hands, sudden weight gain, unusual weakness or fatigue Increase in blood pressure Infection--fever, chills, cough, sore throat, wounds that don't heal, pain or trouble when passing urine, general feeling of discomfort or being unwell Infusion reactions--chest pain, shortness of breath or trouble breathing, feeling faint or lightheaded Kidney injury--decrease in the amount of urine, swelling of the ankles, hands, or feet Stomach pain that is severe, does not go away, or gets worse Stroke--sudden numbness or weakness of the face, arm, or leg, trouble speaking, confusion, trouble walking, loss of balance or coordination, dizziness, severe headache, change in vision Sudden and severe headache, confusion, change in vision, seizures, which may be signs of posterior reversible encephalopathy syndrome (PRES)  Side effects that usually do not require medical attention (report to your care team if they continue or are bothersome): Back pain Change in taste Diarrhea Dry skin Increased tears Nosebleed This list may not describe all possible side effects. Call your doctor for medical advice about side effects. You may report side effects to FDA at 1-800-FDA-1088. Where should I keep my medication? This medication is given in a hospital or clinic. It will not be stored at home. NOTE: This sheet is a summary. It may not cover all possible information. If you have questions about this medicine, talk to your doctor, pharmacist, or health care provider.  2023 Elsevier/Gold Standard (2021-09-19 00:00:00) Fluorouracil Injection What is this  medication? FLUOROURACIL (flure oh YOOR a sil) treats some types of cancer. It works by slowing down the growth of cancer cells. This medicine may be used for other purposes; ask your health care provider or pharmacist if you have questions. COMMON BRAND NAME(S): Adrucil What should I tell my care team before I take this medication? They need to know if you have any of these conditions: Blood disorders Dihydropyrimidine dehydrogenase (DPD) deficiency Infection, such as chickenpox, cold sores, herpes Kidney disease Liver disease Poor nutrition Recent or ongoing radiation therapy An unusual or allergic reaction to fluorouracil, other medications, foods, dyes, or preservatives If you or your partner are pregnant or trying to get pregnant Breast-feeding How should I use this medication? This medication is injected into a vein. It is administered by your care team in a hospital or clinic setting. Talk to your care team about the use of this medication in children. Special care may be needed. Overdosage: If you think you have taken too much of this medicine contact a poison control center or emergency room at once. NOTE: This medicine is only for you. Do not share this medicine with others. What if I miss a dose? Keep appointments for follow-up doses. It is important not to miss your dose. Call your care team if you are unable to keep an appointment. What may interact with this medication? Do not take this medication with any of the following: Live virus vaccines This medication may also interact with the following: Medications that treat or prevent blood clots, such as warfarin, enoxaparin, dalteparin This list may not describe all possible interactions. Give your health care provider a list of all the medicines, herbs, non-prescription drugs, or dietary supplements you use. Also tell them if you smoke, drink alcohol, or use illegal drugs. Some items may interact with your medicine. What  should I watch for while using this medication? Your condition will be monitored carefully while you are receiving this medication. This medication may make you feel generally unwell. This is not uncommon as chemotherapy can affect healthy cells as well as cancer cells. Report any side effects. Continue your course of treatment even though you feel ill unless your care team tells you to stop. In some cases, you may be given additional medications to help with side effects. Follow all directions for their use. This medication may increase your risk of getting an infection. Call your care team for advice if you get a fever, chills, sore throat, or other symptoms of a cold or flu. Do not treat yourself. Try to avoid being around people who are sick. This medication may increase your risk to bruise or bleed. Call your care team if you notice any unusual bleeding. Be careful brushing or flossing your teeth or using a toothpick because you  may get an infection or bleed more easily. If you have any dental work done, tell your dentist you are receiving this medication. Avoid taking medications that contain aspirin, acetaminophen, ibuprofen, naproxen, or ketoprofen unless instructed by your care team. These medications may hide a fever. Do not treat diarrhea with over the counter products. Contact your care team if you have diarrhea that lasts more than 2 days or if it is severe and watery. This medication can make you more sensitive to the sun. Keep out of the sun. If you cannot avoid being in the sun, wear protective clothing and sunscreen. Do not use sun lamps, tanning beds, or tanning booths. Talk to your care team if you or your partner wish to become pregnant or think you might be pregnant. This medication can cause serious birth defects if taken during pregnancy and for 3 months after the last dose. A reliable form of contraception is recommended while taking this medication and for 3 months after the last  dose. Talk to your care team about effective forms of contraception. Do not father a child while taking this medication and for 3 months after the last dose. Use a condom while having sex during this time period. Do not breastfeed while taking this medication. This medication may cause infertility. Talk to your care team if you are concerned about your fertility. What side effects may I notice from receiving this medication? Side effects that you should report to your care team as soon as possible: Allergic reactions--skin rash, itching, hives, swelling of the face, lips, tongue, or throat Heart attack--pain or tightness in the chest, shoulders, arms, or jaw, nausea, shortness of breath, cold or clammy skin, feeling faint or lightheaded Heart failure--shortness of breath, swelling of the ankles, feet, or hands, sudden weight gain, unusual weakness or fatigue Heart rhythm changes--fast or irregular heartbeat, dizziness, feeling faint or lightheaded, chest pain, trouble breathing High ammonia level--unusual weakness or fatigue, confusion, loss of appetite, nausea, vomiting, seizures Infection--fever, chills, cough, sore throat, wounds that don't heal, pain or trouble when passing urine, general feeling of discomfort or being unwell Low red blood cell level--unusual weakness or fatigue, dizziness, headache, trouble breathing Pain, tingling, or numbness in the hands or feet, muscle weakness, change in vision, confusion or trouble speaking, loss of balance or coordination, trouble walking, seizures Redness, swelling, and blistering of the skin over hands and feet Severe or prolonged diarrhea Unusual bruising or bleeding Side effects that usually do not require medical attention (report to your care team if they continue or are bothersome): Dry skin Headache Increased tears Nausea Pain, redness, or swelling with sores inside the mouth or throat Sensitivity to light Vomiting This list may not  describe all possible side effects. Call your doctor for medical advice about side effects. You may report side effects to FDA at 1-800-FDA-1088. Where should I keep my medication? This medication is given in a hospital or clinic. It will not be stored at home. NOTE: This sheet is a summary. It may not cover all possible information. If you have questions about this medicine, talk to your doctor, pharmacist, or health care provider.  2023 Elsevier/Gold Standard (2021-09-16 00:00:00)  [facility]  Discharge Instructions: Thank you for choosing [facility] to provide your oncology and hematology care.  If you have a lab appointment with the Cancer Center, please go directly to the Cancer Center and check in at the registration area.   Wear comfortable clothing  and clothing appropriate for easy access to any Portacath or PICC line.   We strive to give you quality time with your provider. You may need to reschedule your appointment if you arrive late (15 or more minutes).  Arriving late affects you and other patients whose appointments are after yours.  Also, if you miss three or more appointments without notifying the office, you may be dismissed from the clinic at the provider's discretion.      For prescription refill requests, have your pharmacy contact our office and allow 72 hours for refills to be completed.    Today you received the following chemotherapy and/or immunotherapy agents BEVACIZUMAB -5FLOURUORACIL, LEUCOVORIN . AND OXALIPLATIN      To help prevent nausea and vomiting after your treatment, we encourage you to take your nausea medication as directed.  BELOW ARE SYMPTOMS THAT SHOULD BE REPORTED IMMEDIATELY: *FEVER GREATER THAN 100.4 F (38 C) OR HIGHER *CHILLS OR SWEATING *NAUSEA AND VOMITING THAT IS NOT CONTROLLED WITH YOUR NAUSEA MEDICATION *UNUSUAL SHORTNESS OF BREATH *UNUSUAL BRUISING OR BLEEDING *URINARY PROBLEMS (pain or burning  when urinating, or frequent urination) *BOWEL PROBLEMS (unusual diarrhea, constipation, pain near the anus) TENDERNESS IN MOUTH AND THROAT WITH OR WITHOUT PRESENCE OF ULCERS (sore throat, sores in mouth, or a toothache) UNUSUAL RASH, SWELLING OR PAIN  UNUSUAL VAGINAL DISCHARGE OR ITCHING   Items with * indicate a potential emergency and should be followed up as soon as possible or go to the Emergency Department if any problems should occur.  Please show the CHEMOTHERAPY ALERT CARD or IMMUNOTHERAPY ALERT CARD at check-in to the Emergency Department and triage nurse.  Should you have questions after your visit or need to cancel or reschedule your appointment, please contact [facility] AT [facility]  Dept: [phone]  and follow the prompts.  Office hours are 8:00 a.m. to 4:30 p.m. Monday - Friday. Please note that voicemails left after 4:00 p.m. may not be returned until the following business day.  We are closed weekends and major holidays. You have access to a nurse at all times for urgent questions. Please call the main number to the clinic Dept: [phone] and follow the prompts.  For any non-urgent questions, you may also contact your provider using MyChart. We now offer e-Visits for anyone 43 and older to request care online for non-urgent symptoms. For details visit [email or web address].   Also download the MyChart app! Go to the app store, search "MyChart", open the app, select [place] [facility], and log in with your MyChart username and password.

## 2022-10-15 ENCOUNTER — Ambulatory Visit (INDEPENDENT_AMBULATORY_CARE_PROVIDER_SITE_OTHER): Payer: 59 | Admitting: Psychiatry

## 2022-10-15 ENCOUNTER — Encounter: Payer: Self-pay | Admitting: Psychiatry

## 2022-10-15 DIAGNOSIS — F422 Mixed obsessional thoughts and acts: Secondary | ICD-10-CM

## 2022-10-15 DIAGNOSIS — F333 Major depressive disorder, recurrent, severe with psychotic symptoms: Secondary | ICD-10-CM | POA: Diagnosis not present

## 2022-10-15 DIAGNOSIS — G4721 Circadian rhythm sleep disorder, delayed sleep phase type: Secondary | ICD-10-CM | POA: Diagnosis not present

## 2022-10-15 DIAGNOSIS — R530 Neoplastic (malignant) related fatigue: Secondary | ICD-10-CM

## 2022-10-15 DIAGNOSIS — G3184 Mild cognitive impairment, so stated: Secondary | ICD-10-CM | POA: Diagnosis not present

## 2022-10-15 MED ORDER — LAMOTRIGINE 100 MG PO TABS
100.0000 mg | ORAL_TABLET | Freq: Every day | ORAL | 0 refills | Status: DC
Start: 2022-10-15 — End: 2023-01-19

## 2022-10-15 MED ORDER — MODAFINIL 200 MG PO TABS
ORAL_TABLET | ORAL | 5 refills | Status: DC
Start: 2022-10-15 — End: 2023-01-19

## 2022-10-15 MED ORDER — CARIPRAZINE HCL 3 MG PO CAPS: 3.0000 mg | ORAL_CAPSULE | Freq: Every day | ORAL | 1 refills | Status: DC

## 2022-10-15 MED ORDER — BUPROPION HCL ER (XL) 150 MG PO TB24: 450.0000 mg | ORAL_TABLET | Freq: Every morning | ORAL | 0 refills | Status: DC

## 2022-10-15 NOTE — Progress Notes (Signed)
Shannon [name] [id number] 10/20/1965 57 y.o.    Subjective:   Patient ID:  Shannon [name] is a 57 y.o. (DOB Jun 24, 1965) female.  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  seen November 2020 .  No meds were changed at that time.  10/16/19 the following is noted at this appt: Asked questions about sleep supplement.   Just started GI doctor.   Good overall. No mood swings. No depressive episodes.  Cry more with hormonal changes more easily.   Probably sleep too much to avoid.  Occ stress from work interferes with sleep.  Not as productive from home.  Hopes to get back to the office.  Still working from home.  Boss noticed her forgetfulness with deadlines and missing issues in emails.  Used to be sharper. Wonders if related to meds.  Can usually nap if desired for years and not seasonal.  Only anxiety is Covid related. More poor self care.   Hysterectomy December 2020. No med changes  03/18/2020 appointment with the following noted: July had Covid and both parents and her father died from [place]. Able to help mom with arrangements. Required one iron infusion this year. Never took NAC bc it was too hard to take. Sleep, eating schedule is irregular. More productive at home helps her feel better. Plan: No med changes. 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 For a month had dizzy, falls, NV all of which cleared quickly after removal.  Radiation to brain after this. More CA in lungs and abdomen and having chemo.  Oncologist said she has about 5 years of lifespan left.  MRI and CT  pending. Gotten into therapy starting Jonny Ruiz Rodenbaugh next week.   Pray a lot and good connection with friends.  Good support.  Trying to do things for fun.   Is working but finding it difficult mentally and physically DT SE fatigue from chemo. Considering LT disability but $ concerns.   If anxious trouble falling asleep.   Works from home.   D 55 yo.   Plan: No med changes except added modafinil  11/26/2020 appointment with the following noted: Added modafinil and didn't notice much from it. Sleep average about 7 hours.  Would prefer 10 hours and may nap here and there in week of chemo even on chemo.  Had to get it at [facility] with [facility]. No dx of OSA. Last 2 weeks a little low emotionally and mentally after up for a couple of weeks with company and it wore her out. Sleepiness is normally worse than tiredness. Only situational mood effects. Plan: No benefit 200 mg modafinil nor SE therefore Increase Modafinil to 300 mg daily for excessive sleepiness  04/21/2021 appointment with the following noted: Still dealing with chemotx for colon cancer Thinks increase modafinil questionable but misses it more than takes it. No SE. Things are getting harder rough month with more depression and anxiety related to cancer and daughter.  Work is hard.  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  Missing deadlines.   Needs to call social security. D 55  yo and in therapy with Elio Forget.  So angry with patient. Has been able to stay on branded Seroquel. Tumors on CT scan  are shrinking recently. Plan: HX RELAPSE PSYCHOSIS WITH GENERIC QUETIAPINE, Continue branded Seroquel 800 mg HS and lamotrigine 150 mg daily and Luvox 300 mg daily Vraylar 1.5 mg daily for 2 weeks, Then if tolerated increase to 3 mg daily. If mood improves then reduce Seroquel to 1 and 1/2 tablets.  06/06/2021 appt noted: I like it.  More clear headed and better energy.  Missed a few bc out of samples. Reduced  Seroquel 600 mg HS Less depressed.  Especially right after starting and others noticed the benefit. Plan : Continue lamotrigine 150 mg daily and Luvox 300 mg daily Vraylar 3 mg daily. Reduce Seroquel to 400 mg nightly for 2 weeks,  Then reduce Seroquel to 200 mg nightly for 2 weeks,  Then reduce to 100 mg for 2 weeks, Then reduce to 50 mg 2 weeks then stop if you can still sleep ok  08/07/2021 appointment with the following noted: Continues Vraylar 3 mg and Luvox 300 mg daily.  Reduced Seroquel to 200 mg nightly and takes Ambien 5 mg nightly Went slowly down with Seroquel. Doing pretty good.  Some night hard to go to sleep but usally ok.   Still please with Vraylar less sedated. No mood swings.  Some struggle with depression with reduced motivation and enjoyment.  Sometimes bored and would rather go to bed.  Don't want to read or watch TV.   Can follow a show but doesn't find it intersting.  CT scan show tumors shrinking. Plan: Continue lamotrigine 150 mg daily and Luvox 300 mg daily Reduce Vraylar 3 mg every other day to see If depression energy and interest and enjoyment is better Reduce Seroquel to 150 for 2 weeks Then reduce to 100 mg for 2 weeks, Then reduce to 50 mg 2 weeks then stop if you can still sleep ok Wants to have access to Ambien Plan: No benefit 200 mg modafinil nor SE therefore OK continue trial  Modafinil to 300 mg daily for excessive sleepiness  10/30/2021 appointment with the following noted: Reduced quetiapine to 100 mg and trouble sleeping so back to 150 mg HS Fine with reduced Vraylar to 3 mg QOD Increase modafinil to 300 mg and is more alert and more active. No SE Overall mood is ok but still likes to resto often but not sleeping as much.  Not sure if it is chemo related.  Will rest after activity. At night sleeps 10 hours. Still hard to enjoy things and part of reason will lay down bc is bored.   Changed TV cable and not watching much now.  Can't concentrate to  read well.  Enjoys dog.  Enjoying some projects at home.  Patient denies difficulty with sleep maintenance.   Denies appetite disturbance.  Patient reports that motivation have been good.  Patient denies any suicidal ideation.  Still too monotone. This is last week at work.  Not doing well at work DT concentration.  Will apply for disability. Ativan only every few weeks. Plan: Continue lamotrigine 150 mg daily  Continue Vraylar 3 mg every other day  Try again to Reduce Seroquel to 100 mg for 2 weeks, Then reduce to 50 mg 2 weeks then stop if you can still sleep ok Wants to have access to Ambien Reduce Luvox to 2 and 1/2 tablets Start Wellbutrin in the AM 1 for 1 week  then 2 or 300 mg AM  12/22/21 appt noted: Reduced quetiapine to 50 and was OK but when stopped couldn't sleep.  Less daytime drowsy with less.   Reduced fluvoxamine to 250 and started Wellbutrin XL 300 mg AM. More even keel level and less down.  More content.  More steady.  Less teary and emotional. No SE problems with change. ? Modafinil in hopes of better concentration and clarity but ? Effect. Has some trouble with conc but it varies. No increase anxiety or jitteriness. Plan: Continue lamotrigine 150 mg daily  Continue Vraylar 3 mg every other day  Try again to Reduce Seroquel to 25-50 mg HS Wants to have access to Ambien Continue Luvox to 2 and 1/2 tablets Increase Wellbutrin to 450 mg AM residual depression anhedonia  03/11/2022 appointment noted Increase Wellbutrin to 450 mg AM residual depression anhedonia is partially better.  Not as heavy and cloudy.  Energy a little better until last 2 chemos wiped her out more with nausea for 6 days.  Chemo every 3 weeks. Enjoys her dog and helps her get up.   No SE with Wellbutrin.   Minimal anxiety usually.  Except a couple of days per week.  Anxiety over bills.   No sig obsessions.   Problems with daughter are stressful.  D drinks too much and sleeps all day and has social  problems and irresponsibility. D Not working.  She is 55 yo and very needy and childlike emotionally. Reduced Seroquel to 25 mg HS and willing to try to stop.  Avg 10 hour and some naps. But no longer hangover without much Seroquel. Has taken more Ambien. Plan: HX RELAPSE PSYCHOSIS WITH GENERIC QUETIAPINE, Continue lamotrigine 150 mg daily  Continue Vraylar 3 mg every other day  Try again to Reduce Seroquel if possible to 0 or  25 mg HS Wants to have access to Ambien Continue Luvox to 2 and 1/2 tablets Continue  Wellbutrin to 450 mg AM residual depression anhedonia OK continue Modafinil to 300 mg daily for excessive sleepiness and cancer related fatigue  10/15/22 appt noted: Meds as above and off Seroquel.  Rare Ambien.    No sig SE  Lately sleeping ok.  Mouth guard helped clenching of mouth.  Occ night of barely sleeping but better with mouth guard. Mood is ok . Anxiety high DT stressors.  Talking with friends family helps with stress.   Primary stressors CA and D 55 yo.   Disc sleep.   No sig obsessions.  No panic she can't handle.     Past Psychiatric Medication Trials: HX RELAPSE PSYCHOSIS WITH GENERIC QUETIAPINE,  Seroquel 800, Risperdal 2, inVega 3, Abilify 15, Vraylar 3 Citalopram, fluoxetine 80, sertraline to 50, fluvoxamine 300 mg daily lamotrigine 200,  Modafinil 300 Has been under the care of the psychiatrist since March 2000  D seeing Elio Forget  Review of Systems:  Review of Systems  Constitutional:  Positive for fatigue.  HENT:  Negative for rhinorrhea.   Eyes:  Negative for discharge.  Cardiovascular:  Negative for palpitations.  Gastrointestinal:  Positive for constipation. Negative for abdominal distention.  Neurological:  Positive for weakness. Negative for tremors.       No history of SZ  Psychiatric/Behavioral:  Positive for decreased concentration and dysphoric mood. The patient is nervous/anxious.     Medications: I have reviewed the patient's  current medications.  Current Outpatient Medications  Medication Sig Dispense Refill   acetaminophen (TYLENOL) 325 MG tablet Take 2 tablets (  650 mg total) by mouth every 6 (six) hours as needed for mild pain.     atorvastatin (LIPITOR) 10 MG tablet Take by mouth.     CONTOUR NEXT TEST test strip 3 (three) times daily.     fluvoxaMINE (LUVOX) 100 MG tablet TAKE 3 TABLETS (300 MG TOTAL) BY MOUTH AT BEDTIME (Patient taking differently: Take 250 mg by mouth at bedtime.) 270 tablet 0   hyoscyamine (LEVSIN SL) 0.125 MG SL tablet Place 1 tablet (0.125 mg total) under the tongue every 6 (six) hours as needed. 45 tablet 1   insulin aspart (NOVOLOG FLEXPEN) 100 UNIT/ML FlexPen Inject 8 Units into the skin 3 (three) times daily with meals. 15 mL 11   insulin glargine (LANTUS) 100 UNIT/ML Solostar Pen Inject 26 Units into the skin daily. 15 mL 11   levothyroxine (SYNTHROID) 75 MCG tablet Take 1 tablet (75 mcg total) by mouth daily. 30 tablet 0   loratadine (CLARITIN) 10 MG tablet Take 10 mg by mouth daily.     LORazepam (ATIVAN) 0.5 MG tablet Take 1 tablet (0.5 mg total) by mouth every 8 (eight) hours as needed for anxiety. 30 tablet 3   Multiple Vitamin (MULTI-VITAMIN) tablet Take 1 tablet by mouth daily.     NOVOFINE PEN NEEDLE 32G X 6 MM MISC SMARTSIG:Syringe(s) SUB-Q     omeprazole (PRILOSEC) 20 MG capsule Take 20 mg by mouth daily.     ondansetron (ZOFRAN) 4 MG tablet Take 1 tablet (4 mg total) by mouth every 4 (four) hours as needed for nausea. 90 tablet 3   ondansetron (ZOFRAN-ODT) 4 MG disintegrating tablet Take 1 tablet (4 mg total) by mouth every 4 (four) hours as needed for nausea or vomiting. Dissolve on tongue 90 tablet 3   pantoprazole (PROTONIX) 40 MG tablet TAKE 1 TABLET BY MOUTH EVERYDAY AT BEDTIME 30 tablet 0   potassium chloride SA (KLOR-CON M) 20 MEQ tablet Take 1 tablet (20 mEq total) by mouth daily. 5 tablet 0   promethazine (PHENERGAN) 25 MG tablet Take 1 tablet (25 mg total) by mouth  every 6 (six) hours as needed for nausea. 30 tablet 3   senna-docusate (SENOKOT-S) 8.6-50 MG tablet Take 2 tablets by mouth at bedtime.     TRULICITY 3 MG/0.5ML SOPN Inject into the skin.     zolpidem (AMBIEN) 5 MG tablet Take 1 tablet (5 mg total) by mouth at bedtime as needed for sleep. 30 tablet 3   buPROPion (WELLBUTRIN XL) 150 MG 24 hr tablet Take 3 tablets (450 mg total) by mouth every morning. 180 tablet 0   cariprazine (VRAYLAR) 3 MG capsule Take 1 capsule (3 mg total) by mouth daily. 30 capsule 1   lamoTRIgine (LAMICTAL) 100 MG tablet Take 1 tablet (100 mg total) by mouth daily. 90 tablet 0   modafinil (PROVIGIL) 200 MG tablet TAKE 1 & 1/2 (ONE & ONE-HALF) TABLETS BY MOUTH ONCE DAILY 45 tablet 5   potassium chloride SA (KLOR-CON M) 20 MEQ tablet Take 1 tablet (20 mEq total) by mouth daily for 5 days. 5 tablet 0   No current facility-administered medications for this visit.    Medication Side Effects: None  Allergies:  Allergies  Allergen Reactions   Leucovorin Hives, Rash and Other (See Comments)    Redness on face and neck.  Confirmed that it was the leucovorin causing this on 11/19/21.  We are deleting this from future plan.   Clindamycin/Lincomycin Rash   Irinotecan Rash   Penicillins Rash  Reaction: 10 years    Past Medical History:  Diagnosis Date   Anemia    Iron De   Anxiety    Blood clot in vein    Bowel obstruction (HCC)    Colon cancer (HCC) 2018   treated with surgery and chemotherapy   Depression    Diabetes mellitus without complication (HCC)    Type II   DVT (deep venous thrombosis) (HCC) 2019   behind right knee- while she wasa on chemo   GERD (gastroesophageal reflux disease)    History of blood transfusion    History of chemotherapy    History of kidney stones 2012   passed   Hypothyroidism    Obesity     Family History  Problem Relation Age of Onset   Irritable bowel syndrome Mother    Colon polyps Father    Breast cancer Maternal  Grandmother    Diabetes Maternal Grandmother    Diabetes Maternal Grandfather    Breast cancer Paternal Grandmother    Colon polyps Maternal Uncle    Stomach cancer Neg Hx    Pancreatic cancer Neg Hx    Esophageal cancer Neg Hx    Colon cancer Neg Hx     Social History   Socioeconomic History   Marital status: Widowed    Spouse name: Not on file   Number of children: Not on file   Years of education: Not on file   Highest education level: Not on file  Occupational History   Occupation: care coordinator   Tobacco Use   Smoking status: Never   Smokeless tobacco: Never  Vaping Use   Vaping Use: Never used  Substance and Sexual Activity   Alcohol use: Yes    Comment: rare   Drug use: Never   Sexual activity: Not Currently  Other Topics Concern   Not on file  Social History Narrative   Not on file   Social Determinants of Health   Financial Resource Strain: Not on file  Food Insecurity: Not on file  Transportation Needs: Not on file  Physical Activity: Not on file  Stress: Not on file  Social Connections: Not on file  Intimate Partner Violence: Not on file    Past Medical History, Surgical history, Social history, and Family history were reviewed and updated as appropriate.   Please see review of systems for further details on the patient's review from today.   Objective:   Physical Exam:  LMP 02/22/2019 (Approximate)   Physical Exam Constitutional:      General: She is not in acute distress. Musculoskeletal:        General: No deformity.  Neurological:     Mental Status: She is alert and oriented to person, place, and time.     Coordination: Coordination normal.  Psychiatric:        Attention and Perception: Attention and perception normal. She does not perceive auditory or visual hallucinations.        Mood and Affect: Mood is anxious and depressed. Affect is not labile, blunt or inappropriate.        Speech: Speech normal. Speech is not rapid and  pressured.        Behavior: Behavior normal.        Thought Content: Thought content normal. Thought content is not paranoid or delusional. Thought content does not include homicidal or suicidal ideation.        Cognition and Memory: Cognition and memory normal.  Judgment: Judgment normal.     Comments: Insight intact Still some anhedonia and depression     Lab Review:     Component Value Date/Time   NA 141 10/12/2022 1337   NA 142 07/02/2022 0000   K 3.5 10/12/2022 1337   CL 107 10/12/2022 1337   CO2 23 10/12/2022 1337   GLUCOSE 77 10/12/2022 1337   BUN 15 10/12/2022 1337   BUN 14 07/02/2022 0000   CREATININE 1.35 (H) 10/12/2022 1337   CALCIUM 9.1 10/12/2022 1337   PROT 6.7 10/12/2022 1337   ALBUMIN 3.7 10/12/2022 1337   AST 26 10/12/2022 1337   ALT 22 10/12/2022 1337   ALKPHOS 102 10/12/2022 1337   BILITOT 0.5 10/12/2022 1337   GFRNONAA 47 (L) 10/12/2022 1337       Component Value Date/Time   WBC 4.5 10/12/2022 0000   WBC 7.6 05/01/2022 1115   WBC 4.8 06/07/2020 0525   RBC 3.97 10/12/2022 0000   HGB 12.4 10/12/2022 0000   HGB 13.1 05/01/2022 1115   HCT 37 10/12/2022 0000   PLT 84 (A) 10/12/2022 0000   PLT 130 (L) 05/01/2022 1115   MCV 101.4 (H) 05/01/2022 1115   MCV 94 11/27/2021 0000   MCH 31.4 05/01/2022 1115   MCHC 31.0 05/01/2022 1115   RDW 13.9 05/01/2022 1115   LYMPHSABS 1.1 05/01/2022 1115   MONOABS 0.5 05/01/2022 1115   EOSABS 0.1 05/01/2022 1115   BASOSABS 0.0 05/01/2022 1115    No results found for: "POCLITH", "LITHIUM"   No results found for: "PHENYTOIN", "PHENOBARB", "VALPROATE", "CBMZ"   .res Assessment: Plan:    Severe recurrent major depression with psychotic features (HCC) - Plan: lamoTRIgine (LAMICTAL) 100 MG tablet, buPROPion (WELLBUTRIN XL) 150 MG 24 hr tablet, cariprazine (VRAYLAR) 3 MG capsule, modafinil (PROVIGIL) 200 MG tablet  Mixed obsessional thoughts and acts - Plan: cariprazine (VRAYLAR) 3 MG capsule  Delayed sleep  phase syndrome  Mild cognitive impairment - Plan: modafinil (PROVIGIL) 200 MG tablet  Neoplastic malignant related fatigue - Plan: modafinil (PROVIGIL) 200 MG tablet   Overall mood and anxiety are less well managed.  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 In general has handled this well except some trouble going to sleep on occ with anxiety.  Partially better with the increase in Wellbutrin to 450 mg daily.  Try to avoid Xanax if possible BC DDI with Luvox. Ambien 5 prn.  Disc amnesia risk.  Discussed potential metabolic side effects associated with atypical antipsychotics, as well as potential risk for movement side effects. Advised pt to contact office if movement side effects occur.   Asks about trying to reduce some of the meds.  Disc options of fluvoxamine vs lamotrigine. Reduce lamotrigine to 1 of the 100 mg tablets for 1 month then reduce to 1/2 of the 100 mg for 2 weeks and if you feel ok stop it.  Continue Vraylar 3 mg every other day  Off Seroquel Wants to have access to Ambien Continue Luvox to 2 and 1/2 tablets Continue  Wellbutrin to 450 mg AM residual depression anhedonia OK continue Modafinil to 300 mg daily for excessive sleepiness and cancer related fatigue  Disc polypharmacy and will try to eliminate some meds if depression improves.  Disc problems with D and how to intervene getting help for D's mental health and drinking problems.  FU 3 mos  Meredith Staggers, MD, DFAPA   Please see After Visit Summary  for patient specific instructions.  Future Appointments  Date Time Provider Department Center  10/16/2022  2:00 PM [facility] INFUSION CHAIR 1 [facility] None  10/23/2022  1:30 PM [facility] None  10/23/2022  2:00 PM Mosher, Harvin Hazel A, PA-C [facility] None  10/28/2022  9:45 AM [facility] INFUSION CHAIR 5 [facility] None  10/30/2022  2:00 PM [facility] INFUSION CHAIR 2 [facility] None  02/16/2023 11:30 AM  Vaslow, Georgeanna Lea, MD [facility] None    No orders of the defined types were placed in this encounter.      -------------------------------

## 2022-10-15 NOTE — Patient Instructions (Signed)
Reduce lamotrigine to 1 of the 100 mg tablets for 1 month then reduce to 1/2 of the 100 mg for 2 weeks and if you feel ok stop it.

## 2022-10-16 ENCOUNTER — Inpatient Hospital Stay: Payer: 59

## 2022-10-16 VITALS — BP 124/81 | HR 96 | Temp 98.7°F | Resp 14 | Ht 68.0 in | Wt 150.1 lb

## 2022-10-16 DIAGNOSIS — C7802 Secondary malignant neoplasm of left lung: Secondary | ICD-10-CM | POA: Diagnosis not present

## 2022-10-16 DIAGNOSIS — C182 Malignant neoplasm of ascending colon: Secondary | ICD-10-CM

## 2022-10-16 DIAGNOSIS — C7801 Secondary malignant neoplasm of right lung: Secondary | ICD-10-CM | POA: Diagnosis not present

## 2022-10-16 DIAGNOSIS — Z79630 Long term (current) use of alkylating agent: Secondary | ICD-10-CM | POA: Diagnosis not present

## 2022-10-16 DIAGNOSIS — C786 Secondary malignant neoplasm of retroperitoneum and peritoneum: Secondary | ICD-10-CM | POA: Diagnosis not present

## 2022-10-16 DIAGNOSIS — Z79631 Long term (current) use of antimetabolite agent: Secondary | ICD-10-CM | POA: Diagnosis not present

## 2022-10-16 DIAGNOSIS — C189 Malignant neoplasm of colon, unspecified: Secondary | ICD-10-CM | POA: Diagnosis not present

## 2022-10-16 DIAGNOSIS — C7971 Secondary malignant neoplasm of right adrenal gland: Secondary | ICD-10-CM | POA: Diagnosis not present

## 2022-10-16 DIAGNOSIS — Z5189 Encounter for other specified aftercare: Secondary | ICD-10-CM | POA: Diagnosis not present

## 2022-10-16 DIAGNOSIS — C787 Secondary malignant neoplasm of liver and intrahepatic bile duct: Secondary | ICD-10-CM | POA: Diagnosis not present

## 2022-10-16 DIAGNOSIS — C7931 Secondary malignant neoplasm of brain: Secondary | ICD-10-CM | POA: Diagnosis not present

## 2022-10-16 DIAGNOSIS — Z5111 Encounter for antineoplastic chemotherapy: Secondary | ICD-10-CM | POA: Diagnosis not present

## 2022-10-16 MED ORDER — HEPARIN SOD (PORK) LOCK FLUSH 100 UNIT/ML IV SOLN
500.0000 [IU] | Freq: Once | INTRAVENOUS | Status: AC | PRN
  Administered 2022-10-16: 500 [IU]

## 2022-10-16 MED ORDER — SODIUM CHLORIDE 0.9% FLUSH
10.0000 mL | INTRAVENOUS | Status: DC | PRN
  Administered 2022-10-16: 10 mL

## 2022-10-16 NOTE — Patient Instructions (Signed)
Fluorouracil Injection What is this medication? FLUOROURACIL (flure oh YOOR a sil) treats some types of cancer. It works by slowing down the growth of cancer cells. This medicine may be used for other purposes; ask your health care provider or pharmacist if you have questions. COMMON BRAND NAME(S): Adrucil What should I tell my care team before I take this medication? They need to know if you have any of these conditions: Blood disorders Dihydropyrimidine dehydrogenase (DPD) deficiency Infection, such as chickenpox, cold sores, herpes Kidney disease Liver disease Poor nutrition Recent or ongoing radiation therapy An unusual or allergic reaction to fluorouracil, other medications, foods, dyes, or preservatives If you or your partner are pregnant or trying to get pregnant Breast-feeding How should I use this medication? This medication is injected into a vein. It is administered by your care team in a hospital or clinic setting. Talk to your care team about the use of this medication in children. Special care may be needed. Overdosage: If you think you have taken too much of this medicine contact a poison control center or emergency room at once. NOTE: This medicine is only for you. Do not share this medicine with others. What if I miss a dose? Keep appointments for follow-up doses. It is important not to miss your dose. Call your care team if you are unable to keep an appointment. What may interact with this medication? Do not take this medication with any of the following: Live virus vaccines This medication may also interact with the following: Medications that treat or prevent blood clots, such as warfarin, enoxaparin, dalteparin This list may not describe all possible interactions. Give your health care provider a list of all the medicines, herbs, non-prescription drugs, or dietary supplements you use. Also tell them if you smoke, drink alcohol, or use illegal drugs. Some items may  interact with your medicine. What should I watch for while using this medication? Your condition will be monitored carefully while you are receiving this medication. This medication may make you feel generally unwell. This is not uncommon as chemotherapy can affect healthy cells as well as cancer cells. Report any side effects. Continue your course of treatment even though you feel ill unless your care team tells you to stop. In some cases, you may be given additional medications to help with side effects. Follow all directions for their use. This medication may increase your risk of getting an infection. Call your care team for advice if you get a fever, chills, sore throat, or other symptoms of a cold or flu. Do not treat yourself. Try to avoid being around people who are sick. This medication may increase your risk to bruise or bleed. Call your care team if you notice any unusual bleeding. Be careful brushing or flossing your teeth or using a toothpick because you may get an infection or bleed more easily. If you have any dental work done, tell your dentist you are receiving this medication. Avoid taking medications that contain aspirin, acetaminophen, ibuprofen, naproxen, or ketoprofen unless instructed by your care team. These medications may hide a fever. Do not treat diarrhea with over the counter products. Contact your care team if you have diarrhea that lasts more than 2 days or if it is severe and watery. This medication can make you more sensitive to the sun. Keep out of the sun. If you cannot avoid being in the sun, wear protective clothing and sunscreen. Do not use sun lamps, tanning beds, or tanning booths. Talk to   your care team if you or your partner wish to become pregnant or think you might be pregnant. This medication can cause serious birth defects if taken during pregnancy and for 3 months after the last dose. A reliable form of contraception is recommended while taking this  medication and for 3 months after the last dose. Talk to your care team about effective forms of contraception. Do not father a child while taking this medication and for 3 months after the last dose. Use a condom while having sex during this time period. Do not breastfeed while taking this medication. This medication may cause infertility. Talk to your care team if you are concerned about your fertility. What side effects may I notice from receiving this medication? Side effects that you should report to your care team as soon as possible: Allergic reactions--skin rash, itching, hives, swelling of the face, lips, tongue, or throat Heart attack--pain or tightness in the chest, shoulders, arms, or jaw, nausea, shortness of breath, cold or clammy skin, feeling faint or lightheaded Heart failure--shortness of breath, swelling of the ankles, feet, or hands, sudden weight gain, unusual weakness or fatigue Heart rhythm changes--fast or irregular heartbeat, dizziness, feeling faint or lightheaded, chest pain, trouble breathing High ammonia level--unusual weakness or fatigue, confusion, loss of appetite, nausea, vomiting, seizures Infection--fever, chills, cough, sore throat, wounds that don't heal, pain or trouble when passing urine, general feeling of discomfort or being unwell Low red blood cell level--unusual weakness or fatigue, dizziness, headache, trouble breathing Pain, tingling, or numbness in the hands or feet, muscle weakness, change in vision, confusion or trouble speaking, loss of balance or coordination, trouble walking, seizures Redness, swelling, and blistering of the skin over hands and feet Severe or prolonged diarrhea Unusual bruising or bleeding Side effects that usually do not require medical attention (report to your care team if they continue or are bothersome): Dry skin Headache Increased tears Nausea Pain, redness, or swelling with sores inside the mouth or throat Sensitivity  to light Vomiting This list may not describe all possible side effects. Call your doctor for medical advice about side effects. You may report side effects to FDA at 1-800-FDA-1088. Where should I keep my medication? This medication is given in a hospital or clinic. It will not be stored at home. NOTE: This sheet is a summary. It may not cover all possible information. If you have questions about this medicine, talk to your doctor, pharmacist, or health care provider.  2023 Elsevier/Gold Standard (2021-09-16 00:00:00)  

## 2022-10-22 DIAGNOSIS — C182 Malignant neoplasm of ascending colon: Secondary | ICD-10-CM | POA: Diagnosis not present

## 2022-10-22 DIAGNOSIS — K439 Ventral hernia without obstruction or gangrene: Secondary | ICD-10-CM | POA: Diagnosis not present

## 2022-10-22 DIAGNOSIS — N200 Calculus of kidney: Secondary | ICD-10-CM | POA: Diagnosis not present

## 2022-10-22 DIAGNOSIS — C189 Malignant neoplasm of colon, unspecified: Secondary | ICD-10-CM | POA: Diagnosis not present

## 2022-10-22 DIAGNOSIS — R911 Solitary pulmonary nodule: Secondary | ICD-10-CM | POA: Diagnosis not present

## 2022-10-22 DIAGNOSIS — K7689 Other specified diseases of liver: Secondary | ICD-10-CM | POA: Diagnosis not present

## 2022-10-22 DIAGNOSIS — I700 Atherosclerosis of aorta: Secondary | ICD-10-CM | POA: Diagnosis not present

## 2022-10-22 LAB — CBC AND DIFFERENTIAL
HCT: 38 (ref 36–46)
Hemoglobin: 12.7 (ref 12.0–16.0)
Neutrophils Absolute: 0.95
Platelets: 77 10*3/uL — AB (ref 150–400)
WBC: 2.8

## 2022-10-22 LAB — CBC: RBC: 4.05 (ref 3.87–5.11)

## 2022-10-23 ENCOUNTER — Encounter: Payer: Self-pay | Admitting: Oncology

## 2022-10-23 ENCOUNTER — Encounter: Payer: Self-pay | Admitting: Hematology and Oncology

## 2022-10-23 ENCOUNTER — Inpatient Hospital Stay (HOSPITAL_BASED_OUTPATIENT_CLINIC_OR_DEPARTMENT_OTHER): Payer: 59 | Admitting: Hematology and Oncology

## 2022-10-23 ENCOUNTER — Inpatient Hospital Stay: Payer: 59

## 2022-10-23 VITALS — BP 120/84 | HR 92 | Resp 14 | Ht 68.0 in | Wt 147.0 lb

## 2022-10-23 DIAGNOSIS — C7971 Secondary malignant neoplasm of right adrenal gland: Secondary | ICD-10-CM | POA: Diagnosis not present

## 2022-10-23 DIAGNOSIS — C787 Secondary malignant neoplasm of liver and intrahepatic bile duct: Secondary | ICD-10-CM

## 2022-10-23 DIAGNOSIS — Z5111 Encounter for antineoplastic chemotherapy: Secondary | ICD-10-CM | POA: Diagnosis not present

## 2022-10-23 DIAGNOSIS — C189 Malignant neoplasm of colon, unspecified: Secondary | ICD-10-CM

## 2022-10-23 DIAGNOSIS — D702 Other drug-induced agranulocytosis: Secondary | ICD-10-CM

## 2022-10-23 DIAGNOSIS — C7800 Secondary malignant neoplasm of unspecified lung: Secondary | ICD-10-CM

## 2022-10-23 DIAGNOSIS — C786 Secondary malignant neoplasm of retroperitoneum and peritoneum: Secondary | ICD-10-CM | POA: Diagnosis not present

## 2022-10-23 DIAGNOSIS — C7802 Secondary malignant neoplasm of left lung: Secondary | ICD-10-CM | POA: Diagnosis not present

## 2022-10-23 DIAGNOSIS — C7931 Secondary malignant neoplasm of brain: Secondary | ICD-10-CM | POA: Diagnosis not present

## 2022-10-23 DIAGNOSIS — C7801 Secondary malignant neoplasm of right lung: Secondary | ICD-10-CM | POA: Diagnosis not present

## 2022-10-23 DIAGNOSIS — Z79631 Long term (current) use of antimetabolite agent: Secondary | ICD-10-CM | POA: Diagnosis not present

## 2022-10-23 DIAGNOSIS — Z79630 Long term (current) use of alkylating agent: Secondary | ICD-10-CM | POA: Diagnosis not present

## 2022-10-23 DIAGNOSIS — Z5189 Encounter for other specified aftercare: Secondary | ICD-10-CM | POA: Diagnosis not present

## 2022-10-23 DIAGNOSIS — C182 Malignant neoplasm of ascending colon: Secondary | ICD-10-CM

## 2022-10-23 LAB — BASIC METABOLIC PANEL
BUN: 18 (ref 4–21)
CO2: 23 — AB (ref 13–22)
Chloride: 105 (ref 99–108)
Creatinine: 1.1 (ref 0.5–1.1)
EGFR (Non-African Amer.): 52
Glucose: 226
Potassium: 3.9 mEq/L (ref 3.5–5.1)
Sodium: 138 (ref 137–147)

## 2022-10-23 LAB — HEPATIC FUNCTION PANEL
ALT: 19 U/L (ref 7–35)
AST: 28 (ref 13–35)
Alkaline Phosphatase: 101 (ref 25–125)
Bilirubin, Total: 0.3

## 2022-10-23 LAB — CBC: RBC: 3.85 — AB (ref 3.87–5.11)

## 2022-10-23 LAB — COMPREHENSIVE METABOLIC PANEL
Albumin: 3.9 (ref 3.5–5.0)
Calcium: 9.1 (ref 8.7–10.7)

## 2022-10-23 LAB — CBC AND DIFFERENTIAL
HCT: 36 (ref 36–46)
Hemoglobin: 12 (ref 12.0–16.0)
Neutrophils Absolute: 1.01
Platelets: 72 10*3/uL — AB (ref 150–400)
WBC: 2.6

## 2022-10-23 LAB — CEA: CEA: 17.2 — AB (ref 0–4.7)

## 2022-10-23 MED ORDER — FILGRASTIM-SNDZ 480 MCG/0.8ML IJ SOSY
480.0000 ug | PREFILLED_SYRINGE | Freq: Once | INTRAMUSCULAR | Status: AC
  Administered 2022-10-23: 480 ug via SUBCUTANEOUS
  Filled 2022-10-23: qty 0.8

## 2022-10-23 NOTE — Progress Notes (Cosign Needed)
[facility]  [address] [place],  [place]  [address] [phone]  Clinic Day:  10/23/2022  Referring physician: Weston Settle, MD   HISTORY OF PRESENT ILLNESS:  The patient is a 57 y.o. female with metastatic colon cancer, which includes spread of disease to her abdominal cavity, lungs, brain and right adrenal gland. Recent CT scans at [facility] showed 3 liver metastases.  She comes in today to to review her recent CT scans prior to a 5th cycle of FOLFOX/Avastin.  The patient claims to have tolerated her 4th cycle of this chemotherapy regimen fairly well.  Her daily quality of life remains great.  She denies having any new GI symptoms or findings which concern her for overt signs of disease progression.    With respect to her colon cancer history, she is status post a right hemicolectomy in early October 2018, followed by 12 cycles of adjuvant FOLFOX chemotherapy, which were completed in April 2019.  In December 2021, she underwent a left cerebellar metastasectomy, whose pathology was consistent with metastatic colon cancer.  Tumor testing did come back MMR normal. CT scans revealed evidence of her cancer being in multiple locations, for which she took 40 cycles of FOLFIRI/Avastin.  Recent scans have just revealed liver metastasis, so we have her back on FOLFOX/Avastin.      PHYSICAL EXAM:  Blood pressure 109/80, pulse 98, temperature 98.4 F (36.9 C), temperature source Oral, resp. rate 18, height 5\' 8"  (1.727 m), weight 147 lb (66.7 kg), last menstrual period 02/22/2019, SpO2 99 %. Wt Readings from Last 3 Encounters:  10/23/22 147 lb (66.7 kg)  10/16/22 150 lb 1.3 oz (68.1 kg)  10/14/22 149 lb 12 oz (67.9 kg)   Body mass index is 22.35 kg/m.  Performance status (ECOG): 1 - Symptomatic but completely ambulatory  Physical Exam Vitals and nursing note reviewed.  Constitutional:      General: She is not in acute distress.    Appearance: Normal  appearance.  HENT:     Head: Normocephalic and atraumatic.     Mouth/Throat:     Mouth: Mucous membranes are moist.     Pharynx: Oropharynx is clear. No oropharyngeal exudate or posterior oropharyngeal erythema.  Eyes:     General: No scleral icterus.    Extraocular Movements: Extraocular movements intact.     Conjunctiva/sclera: Conjunctivae normal.     Pupils: Pupils are equal, round, and reactive to light.  Cardiovascular:     Rate and Rhythm: Normal rate and regular rhythm.     Heart sounds: Normal heart sounds. No murmur heard.    No friction rub. No gallop.  Pulmonary:     Effort: Pulmonary effort is normal.     Breath sounds: Normal breath sounds. No wheezing, rhonchi or rales.  Abdominal:     General: There is no distension.     Palpations: Abdomen is soft. There is no mass.     Tenderness: There is abdominal tenderness in the periumbilical area.  Musculoskeletal:        General: Normal range of motion.     Cervical back: Normal range of motion and neck supple. No tenderness.     Right lower leg: No edema.     Left lower leg: No edema.  Lymphadenopathy:     Cervical: No cervical adenopathy.     Upper Body:     Right upper body: No supraclavicular or axillary adenopathy.     Left upper body: No supraclavicular  or axillary adenopathy.     Lower Body: No right inguinal adenopathy. No left inguinal adenopathy.  Skin:    General: Skin is warm and dry.     Coloration: Skin is not jaundiced.     Findings: No rash.  Neurological:     Mental Status: She is alert and oriented to person, place, and time.     Cranial Nerves: No cranial nerve deficit.  Psychiatric:        Mood and Affect: Mood normal.        Behavior: Behavior normal.        Thought Content: Thought content normal.     LABS:      Latest Ref Rng & Units 10/23/2022   12:00 AM 10/12/2022   12:00 AM 09/28/2022   12:00 AM  CBC  WBC  2.6     4.5     3.9      Hemoglobin 12.0 - 16.0 12.0     12.4     12.8       Hematocrit 36 - 46 36     37     39      Platelets 150 - 400 K/uL 72     84     98         This result is from an external source.      Latest Ref Rng & Units 10/23/2022   12:00 AM 10/12/2022    1:37 PM 09/28/2022    2:18 PM  CMP  Glucose 70 - 99 mg/dL  77  93   BUN 4 - 21 18     15  16    Creatinine 0.5 - 1.1 1.1     1.35  1.31   Sodium 137 - 147 138     141  140   Potassium 3.5 - 5.1 mEq/L 3.9     3.5  4.3   Chloride 99 - 108 105     107  104   CO2 13 - 22 23     23  25    Calcium 8.7 - 10.7 9.1     9.1  9.2   Total Protein 6.5 - 8.1 g/dL  6.7  6.6   Total Bilirubin 0.3 - 1.2 mg/dL  0.5  0.5   Alkaline Phos 25 - 125 101     102  92   AST 13 - 35 28     26  24    ALT 7 - 35 U/L 19     22  21       This result is from an external source.     Lab Results  Component Value Date   CEA1 5.5 (H) 04/10/2021   CEA1 22.8 (H) 07/16/2020   CEA 17.2 (A) 10/22/2022   CEA 14.4 07/02/2022   /  CEA  Date Value Ref Range Status  10/22/2022 17.2 (A) 0 - 4.7 Final  04/10/2021 5.5 (H) 0.0 - 4.7 ng/mL Final    Comment:    (NOTE)                             Nonsmokers          <3.9                             Smokers             <5.6 Roche  Diagnostics Electrochemiluminescence Immunoassay (ECLIA) Values obtained with different assay methods or kits cannot be used interchangeably.  Results cannot be interpreted as absolute evidence of the presence or absence of malignant disease. Performed At: [facility] [address] [place], [place] [id number] Jolene Schimke MD WU:[id number]    No results found for: "PSA1" No results found for: "CAN199" No results found for: "CAN125"  No results found for: "TOTALPROTELP", "ALBUMINELP", "A1GS", "A2GS", "BETS", "BETA2SER", "GAMS", "MSPIKE", "SPEI" Lab Results  Component Value Date   TIBC 476 (H) 04/29/2020   FERRITIN 17 04/29/2020   FERRITIN 7.3 (L) 11/21/2019   IRONPCTSAT 15 04/29/2020   IRONPCTSAT 8.5 (L) 11/21/2019   No results  found for: "LDH"     Component Value Date/Time   CEA1 5.5 (H) 04/10/2021 1007   CEA 17.2 (A) 10/22/2022 0000    Review Flowsheet  More data exists      Latest Ref Rng & Units 11/27/2021 07/02/2022 10/22/2022  Oncology Labs  CEA ([facility]) 0 - 4.7 5.9     14.4     17.2        Details       This result is from an external source.          STUDIES:    Exam(s): [id number] CT/CT CHEST-ABD-PELV W/IV CM  CLINICAL DATA: Restaging metastatic colon cancer.  * Tracking Code: BO *  EXAM:  CT CHEST, ABDOMEN, AND PELVIS WITH CONTRAST  TECHNIQUE:  Multidetector CT imaging of the chest, abdomen and pelvis was  performed following the standard protocol during bolus  administration of intravenous contrast.   RADIATION DOSE REDUCTION: This exam was performed according to the  departmental dose-optimization program which includes automated  exposure control, adjustment of the mA and/or kV according to  patient size and/or use of iterative reconstruction technique.  CONTRAST: 80 cc Optiray 350   COMPARISON: 07/02/2022   FINDINGS:  CT CHEST FINDINGS  Cardiovascular: Right Port-A-Cath tip: SVC.  Mild atherosclerotic calcification of the aortic arch and of the  left subclavian artery.  Mediastinum/Nodes: Unremarkable  Lungs/Pleura: The dominant component of the right upper lobe mass  measures 3.1 by 1.6 cm on image 47 series 4, reduced in size  from prior measurement of 3.2 by 2.2 cm on 07/02/2022, compatible  with improvement. The peripheral nodule along  the margin of this process is likewise reduced in size and more  bandlike today.  The right lower lobe nodule measures 1.9 by 1.1 cm on image 79  series 4, previously 2.0 by 1.4 cm by my measurements.  A few scattered sub-centimeter pulmonary nodules are generally  stable. Mild cavitation of the 5 mm lingular nodule on image 75  series 4, the cavitation is new compared to the prior exam.  Musculoskeletal: Lower thoracic spondylosis.    CT ABDOMEN PELVIS FINDINGS  Hepatobiliary: Caudate lesion 1.1 by 0.9 cm, formerly 1.4 by 1.1 cm.  Subtle 0.6 cm lesion in the dome of the right hepatic lobe, roughly  stable. 1.1 cm hypodensity adjacent to the gallbladder along the  gallbladder fossa, previously 1.0 cm.  Pancreas: Unremarkable  Spleen: Unremarkable  Adrenals/Urinary Tract: 2 mm right mid kidney nonobstructive renal  calculus. Adrenal glands unremarkable. Urinary bladder unremarkable.  Stomach/Bowel: Right hemicolectomy. Mild wall thickening in the  rectum, cannot exclude rectal inflammation.  Vascular/Lymphatic: Mild aortoiliac atherosclerotic vascular  calcification. No pathologic adenopathy.  Reproductive: Uterus absent. Adnexa unremarkable.  Other: No supplemental non-categorized findings.  Musculoskeletal: Small periumbilical ventral hernia contains adipose  tissue,  image 91 series 2.   IMPRESSION:  1. Reduced size of the dominant right upper lobe mass and the right  lower lobe nodule.  2. Mild cavitation of a 5 mm lingular nodule. Other scattered  sub-centimeter pulmonary nodules are generally stable.  3. Mild reduction in size of the caudate lesion. Stable nonspecific  0.6 cm lesion in the dome of the right hepatic lobe.  4. Mild wall thickening in the rectum, cannot exclude rectal  inflammation.  5. 2 mm right mid kidney nonobstructive renal calculus.  6. Small periumbilical ventral hernia contains adipose tissue.  7. Aortic atherosclerosis.  Aortic Atherosclerosis (ICD10-I70.0).   ASSESSMENT & PLAN:   Assessment/Plan:  57 y.o. female with metastatic colon cancer.  She is understandably pleased that she has already had reduction in her disease after 4 cycles of FOLFOX/bevacizumab.  Her ANC and platelets are lower than we would like.  We will give her Zarxio today to increase her white blood count, so that we can proceed with chemotherapy next week.  We will plan to check a CBC on May 28 and if the counts  are adequate, she will proceed with 5th cycle of FOLFOX/bevacizumab next week.  We will plan to see her back in 2 weeks for repeat clinical assessment prior to a 6th cycle.  The patient understands all the plans discussed today and is in agreement with them.  She knows to contact our office if she develops concerns prior to her next appointment.     Adah Perl, PA-C   Physician Assistant [facility] [place] [phone]

## 2022-10-25 LAB — CEA: CEA: 18.1 ng/mL — ABNORMAL HIGH (ref 0.0–4.7)

## 2022-10-27 ENCOUNTER — Encounter: Payer: Self-pay | Admitting: Oncology

## 2022-10-27 ENCOUNTER — Inpatient Hospital Stay: Payer: 59

## 2022-10-27 DIAGNOSIS — C189 Malignant neoplasm of colon, unspecified: Secondary | ICD-10-CM

## 2022-10-27 DIAGNOSIS — C182 Malignant neoplasm of ascending colon: Secondary | ICD-10-CM

## 2022-10-27 DIAGNOSIS — C787 Secondary malignant neoplasm of liver and intrahepatic bile duct: Secondary | ICD-10-CM

## 2022-10-27 DIAGNOSIS — C7800 Secondary malignant neoplasm of unspecified lung: Secondary | ICD-10-CM | POA: Diagnosis not present

## 2022-10-27 LAB — CBC: RBC: 4.03 (ref 3.87–5.11)

## 2022-10-27 LAB — CBC AND DIFFERENTIAL
HCT: 37 (ref 36–46)
Hemoglobin: 12.8 (ref 12.0–16.0)
Neutrophils Absolute: 0.75
Platelets: 96 10*3/uL — AB (ref 150–400)
WBC: 2.5

## 2022-10-27 MED FILL — Fosaprepitant Dimeglumine For IV Infusion 150 MG (Base Eq): INTRAVENOUS | Qty: 5 | Status: AC

## 2022-10-27 MED FILL — Oxaliplatin IV Soln 50 MG/10ML: INTRAVENOUS | Qty: 30 | Status: AC

## 2022-10-27 MED FILL — Dexamethasone Sodium Phosphate Inj 100 MG/10ML: INTRAMUSCULAR | Qty: 1 | Status: AC

## 2022-10-27 MED FILL — Fluorouracil IV Soln 2.5 GM/50ML (50 MG/ML): INTRAVENOUS | Qty: 14 | Status: AC

## 2022-10-27 MED FILL — Fluorouracil IV Soln 5 GM/100ML (50 MG/ML): INTRAVENOUS | Qty: 87 | Status: AC

## 2022-10-28 ENCOUNTER — Other Ambulatory Visit: Payer: Self-pay | Admitting: Pharmacist

## 2022-10-28 ENCOUNTER — Inpatient Hospital Stay: Payer: 59

## 2022-10-28 VITALS — BP 117/80 | HR 99 | Temp 98.1°F | Resp 18 | Ht 68.0 in | Wt 151.0 lb

## 2022-10-28 DIAGNOSIS — Z5189 Encounter for other specified aftercare: Secondary | ICD-10-CM | POA: Diagnosis not present

## 2022-10-28 DIAGNOSIS — Z5111 Encounter for antineoplastic chemotherapy: Secondary | ICD-10-CM | POA: Diagnosis not present

## 2022-10-28 DIAGNOSIS — Z79630 Long term (current) use of alkylating agent: Secondary | ICD-10-CM | POA: Diagnosis not present

## 2022-10-28 DIAGNOSIS — C7802 Secondary malignant neoplasm of left lung: Secondary | ICD-10-CM | POA: Diagnosis not present

## 2022-10-28 DIAGNOSIS — C189 Malignant neoplasm of colon, unspecified: Secondary | ICD-10-CM | POA: Diagnosis not present

## 2022-10-28 DIAGNOSIS — C7931 Secondary malignant neoplasm of brain: Secondary | ICD-10-CM | POA: Diagnosis not present

## 2022-10-28 DIAGNOSIS — Z79631 Long term (current) use of antimetabolite agent: Secondary | ICD-10-CM | POA: Diagnosis not present

## 2022-10-28 DIAGNOSIS — C182 Malignant neoplasm of ascending colon: Secondary | ICD-10-CM

## 2022-10-28 DIAGNOSIS — C7801 Secondary malignant neoplasm of right lung: Secondary | ICD-10-CM | POA: Diagnosis not present

## 2022-10-28 DIAGNOSIS — C787 Secondary malignant neoplasm of liver and intrahepatic bile duct: Secondary | ICD-10-CM | POA: Diagnosis not present

## 2022-10-28 DIAGNOSIS — C7971 Secondary malignant neoplasm of right adrenal gland: Secondary | ICD-10-CM | POA: Diagnosis not present

## 2022-10-28 DIAGNOSIS — C786 Secondary malignant neoplasm of retroperitoneum and peritoneum: Secondary | ICD-10-CM | POA: Diagnosis not present

## 2022-10-28 MED ORDER — SODIUM CHLORIDE 0.9 % IV SOLN
10.0000 mg | Freq: Once | INTRAVENOUS | Status: AC
  Administered 2022-10-28: 10 mg via INTRAVENOUS
  Filled 2022-10-28: qty 10

## 2022-10-28 MED ORDER — OXALIPLATIN CHEMO INJECTION 50 MG/10ML
85.0000 mg/m2 | Freq: Once | INTRAVENOUS | Status: AC
  Administered 2022-10-28: 150 mg via INTRAVENOUS
  Filled 2022-10-28: qty 20

## 2022-10-28 MED ORDER — SODIUM CHLORIDE 0.9 % IV SOLN
5.0000 mg/kg | Freq: Once | INTRAVENOUS | Status: AC
  Administered 2022-10-28: 350 mg via INTRAVENOUS
  Filled 2022-10-28: qty 14

## 2022-10-28 MED ORDER — SODIUM CHLORIDE 0.9% FLUSH: 10.0000 mL | INTRAVENOUS | Status: DC | PRN

## 2022-10-28 MED ORDER — SODIUM CHLORIDE 0.9 % IV SOLN
2400.0000 mg/m2 | INTRAVENOUS | Status: DC
  Administered 2022-10-28: 4350 mg via INTRAVENOUS
  Filled 2022-10-28: qty 87

## 2022-10-28 MED ORDER — DEXTROSE 5 % IV SOLN: Freq: Once | INTRAVENOUS | Status: AC

## 2022-10-28 MED ORDER — FAMOTIDINE IN NACL 20-0.9 MG/50ML-% IV SOLN
20.0000 mg | Freq: Once | INTRAVENOUS | Status: AC
  Administered 2022-10-28: 20 mg via INTRAVENOUS
  Filled 2022-10-28: qty 50

## 2022-10-28 MED ORDER — PALONOSETRON HCL INJECTION 0.25 MG/5ML
0.2500 mg | Freq: Once | INTRAVENOUS | Status: AC
  Administered 2022-10-28: 0.25 mg via INTRAVENOUS
  Filled 2022-10-28: qty 5

## 2022-10-28 MED ORDER — SODIUM CHLORIDE 0.9 % IV SOLN
150.0000 mg | Freq: Once | INTRAVENOUS | Status: AC
  Administered 2022-10-28: 150 mg via INTRAVENOUS
  Filled 2022-10-28: qty 150

## 2022-10-28 MED ORDER — HEPARIN SOD (PORK) LOCK FLUSH 100 UNIT/ML IV SOLN: 500.0000 [IU] | Freq: Once | INTRAVENOUS | Status: DC | PRN

## 2022-10-28 MED ORDER — FLUOROURACIL CHEMO INJECTION 2.5 GM/50ML
400.0000 mg/m2 | Freq: Once | INTRAVENOUS | Status: AC
  Administered 2022-10-28: 700 mg via INTRAVENOUS
  Filled 2022-10-28: qty 14

## 2022-10-28 MED ORDER — SODIUM CHLORIDE 0.9 % IV SOLN: Freq: Once | INTRAVENOUS | Status: AC

## 2022-10-28 MED ORDER — DIPHENHYDRAMINE HCL 50 MG/ML IJ SOLN
25.0000 mg | Freq: Once | INTRAMUSCULAR | Status: AC
  Administered 2022-10-28: 25 mg via INTRAVENOUS
  Filled 2022-10-28: qty 1

## 2022-10-28 NOTE — Patient Instructions (Signed)
Fluorouracil Injection What is this medication? FLUOROURACIL (flure oh YOOR a sil) treats some types of cancer. It works by slowing down the growth of cancer cells. This medicine may be used for other purposes; ask your health care provider or pharmacist if you have questions. COMMON BRAND NAME(S): Adrucil What should I tell my care team before I take this medication? They need to know if you have any of these conditions: Blood disorders Dihydropyrimidine dehydrogenase (DPD) deficiency Infection, such as chickenpox, cold sores, herpes Kidney disease Liver disease Poor nutrition Recent or ongoing radiation therapy An unusual or allergic reaction to fluorouracil, other medications, foods, dyes, or preservatives If you or your partner are pregnant or trying to get pregnant Breast-feeding How should I use this medication? This medication is injected into a vein. It is administered by your care team in a hospital or clinic setting. Talk to your care team about the use of this medication in children. Special care may be needed. Overdosage: If you think you have taken too much of this medicine contact a poison control center or emergency room at once. NOTE: This medicine is only for you. Do not share this medicine with others. What if I miss a dose? Keep appointments for follow-up doses. It is important not to miss your dose. Call your care team if you are unable to keep an appointment. What may interact with this medication? Do not take this medication with any of the following: Live virus vaccines This medication may also interact with the following: Medications that treat or prevent blood clots, such as warfarin, enoxaparin, dalteparin This list may not describe all possible interactions. Give your health care provider a list of all the medicines, herbs, non-prescription drugs, or dietary supplements you use. Also tell them if you smoke, drink alcohol, or use illegal drugs. Some items may  interact with your medicine. What should I watch for while using this medication? Your condition will be monitored carefully while you are receiving this medication. This medication may make you feel generally unwell. This is not uncommon as chemotherapy can affect healthy cells as well as cancer cells. Report any side effects. Continue your course of treatment even though you feel ill unless your care team tells you to stop. In some cases, you may be given additional medications to help with side effects. Follow all directions for their use. This medication may increase your risk of getting an infection. Call your care team for advice if you get a fever, chills, sore throat, or other symptoms of a cold or flu. Do not treat yourself. Try to avoid being around people who are sick. This medication may increase your risk to bruise or bleed. Call your care team if you notice any unusual bleeding. Be careful brushing or flossing your teeth or using a toothpick because you may get an infection or bleed more easily. If you have any dental work done, tell your dentist you are receiving this medication. Avoid taking medications that contain aspirin, acetaminophen, ibuprofen, naproxen, or ketoprofen unless instructed by your care team. These medications may hide a fever. Do not treat diarrhea with over the counter products. Contact your care team if you have diarrhea that lasts more than 2 days or if it is severe and watery. This medication can make you more sensitive to the sun. Keep out of the sun. If you cannot avoid being in the sun, wear protective clothing and sunscreen. Do not use sun lamps, tanning beds, or tanning booths. Talk to   your care team if you or your partner wish to become pregnant or think you might be pregnant. This medication can cause serious birth defects if taken during pregnancy and for 3 months after the last dose. A reliable form of contraception is recommended while taking this  medication and for 3 months after the last dose. Talk to your care team about effective forms of contraception. Do not father a child while taking this medication and for 3 months after the last dose. Use a condom while having sex during this time period. Do not breastfeed while taking this medication. This medication may cause infertility. Talk to your care team if you are concerned about your fertility. What side effects may I notice from receiving this medication? Side effects that you should report to your care team as soon as possible: Allergic reactions--skin rash, itching, hives, swelling of the face, lips, tongue, or throat Heart attack--pain or tightness in the chest, shoulders, arms, or jaw, nausea, shortness of breath, cold or clammy skin, feeling faint or lightheaded Heart failure--shortness of breath, swelling of the ankles, feet, or hands, sudden weight gain, unusual weakness or fatigue Heart rhythm changes--fast or irregular heartbeat, dizziness, feeling faint or lightheaded, chest pain, trouble breathing High ammonia level--unusual weakness or fatigue, confusion, loss of appetite, nausea, vomiting, seizures Infection--fever, chills, cough, sore throat, wounds that don't heal, pain or trouble when passing urine, general feeling of discomfort or being unwell Low red blood cell level--unusual weakness or fatigue, dizziness, headache, trouble breathing Pain, tingling, or numbness in the hands or feet, muscle weakness, change in vision, confusion or trouble speaking, loss of balance or coordination, trouble walking, seizures Redness, swelling, and blistering of the skin over hands and feet Severe or prolonged diarrhea Unusual bruising or bleeding Side effects that usually do not require medical attention (report to your care team if they continue or are bothersome): Dry skin Headache Increased tears Nausea Pain, redness, or swelling with sores inside the mouth or throat Sensitivity  to light Vomiting This list may not describe all possible side effects. Call your doctor for medical advice about side effects. You may report side effects to FDA at 1-800-FDA-1088. Where should I keep my medication? This medication is given in a hospital or clinic. It will not be stored at home. NOTE: This sheet is a summary. It may not cover all possible information. If you have questions about this medicine, talk to your doctor, pharmacist, or health care provider.  2023 Elsevier/Gold Standard (2021-09-16 00:00:00)  

## 2022-10-28 NOTE — Progress Notes (Signed)
Ok to proceed with chemo despite ANC=750 and Platelets=96K per Dr. Melvyn Neth.  Patient denies any symptoms of infection or bleeding.  Patient will receive 5 doses of zarxio next week to help reduce the risk of febrile neutropenia.

## 2022-10-29 LAB — CEA: CEA: 17.2

## 2022-10-30 ENCOUNTER — Telehealth: Payer: Self-pay | Admitting: Oncology

## 2022-10-30 ENCOUNTER — Inpatient Hospital Stay: Payer: 59

## 2022-10-30 VITALS — BP 130/85 | HR 95 | Temp 98.2°F | Resp 14 | Ht 68.0 in | Wt 150.1 lb

## 2022-10-30 DIAGNOSIS — C7931 Secondary malignant neoplasm of brain: Secondary | ICD-10-CM | POA: Diagnosis not present

## 2022-10-30 DIAGNOSIS — H524 Presbyopia: Secondary | ICD-10-CM | POA: Diagnosis not present

## 2022-10-30 DIAGNOSIS — C7801 Secondary malignant neoplasm of right lung: Secondary | ICD-10-CM | POA: Diagnosis not present

## 2022-10-30 DIAGNOSIS — E119 Type 2 diabetes mellitus without complications: Secondary | ICD-10-CM | POA: Diagnosis not present

## 2022-10-30 DIAGNOSIS — Z5111 Encounter for antineoplastic chemotherapy: Secondary | ICD-10-CM | POA: Diagnosis not present

## 2022-10-30 DIAGNOSIS — C7971 Secondary malignant neoplasm of right adrenal gland: Secondary | ICD-10-CM | POA: Diagnosis not present

## 2022-10-30 DIAGNOSIS — C189 Malignant neoplasm of colon, unspecified: Secondary | ICD-10-CM | POA: Diagnosis not present

## 2022-10-30 DIAGNOSIS — Z794 Long term (current) use of insulin: Secondary | ICD-10-CM | POA: Diagnosis not present

## 2022-10-30 DIAGNOSIS — Z79631 Long term (current) use of antimetabolite agent: Secondary | ICD-10-CM | POA: Diagnosis not present

## 2022-10-30 DIAGNOSIS — C786 Secondary malignant neoplasm of retroperitoneum and peritoneum: Secondary | ICD-10-CM | POA: Diagnosis not present

## 2022-10-30 DIAGNOSIS — C787 Secondary malignant neoplasm of liver and intrahepatic bile duct: Secondary | ICD-10-CM | POA: Diagnosis not present

## 2022-10-30 DIAGNOSIS — C182 Malignant neoplasm of ascending colon: Secondary | ICD-10-CM

## 2022-10-30 DIAGNOSIS — Z5189 Encounter for other specified aftercare: Secondary | ICD-10-CM | POA: Diagnosis not present

## 2022-10-30 DIAGNOSIS — C7802 Secondary malignant neoplasm of left lung: Secondary | ICD-10-CM | POA: Diagnosis not present

## 2022-10-30 DIAGNOSIS — Z79630 Long term (current) use of alkylating agent: Secondary | ICD-10-CM | POA: Diagnosis not present

## 2022-10-30 MED ORDER — SODIUM CHLORIDE 0.9% FLUSH
10.0000 mL | INTRAVENOUS | Status: DC | PRN
  Administered 2022-10-30: 10 mL

## 2022-10-30 MED ORDER — HEPARIN SOD (PORK) LOCK FLUSH 100 UNIT/ML IV SOLN
500.0000 [IU] | Freq: Once | INTRAVENOUS | Status: AC | PRN
  Administered 2022-10-30: 500 [IU]

## 2022-10-30 NOTE — Patient Instructions (Signed)
Fluorouracil Injection What is this medication? FLUOROURACIL (flure oh YOOR a sil) treats some types of cancer. It works by slowing down the growth of cancer cells. This medicine may be used for other purposes; ask your health care provider or pharmacist if you have questions. COMMON BRAND NAME(S): Adrucil What should I tell my care team before I take this medication? They need to know if you have any of these conditions: Blood disorders Dihydropyrimidine dehydrogenase (DPD) deficiency Infection, such as chickenpox, cold sores, herpes Kidney disease Liver disease Poor nutrition Recent or ongoing radiation therapy An unusual or allergic reaction to fluorouracil, other medications, foods, dyes, or preservatives If you or your partner are pregnant or trying to get pregnant Breast-feeding How should I use this medication? This medication is injected into a vein. It is administered by your care team in a hospital or clinic setting. Talk to your care team about the use of this medication in children. Special care may be needed. Overdosage: If you think you have taken too much of this medicine contact a poison control center or emergency room at once. NOTE: This medicine is only for you. Do not share this medicine with others. What if I miss a dose? Keep appointments for follow-up doses. It is important not to miss your dose. Call your care team if you are unable to keep an appointment. What may interact with this medication? Do not take this medication with any of the following: Live virus vaccines This medication may also interact with the following: Medications that treat or prevent blood clots, such as warfarin, enoxaparin, dalteparin This list may not describe all possible interactions. Give your health care provider a list of all the medicines, herbs, non-prescription drugs, or dietary supplements you use. Also tell them if you smoke, drink alcohol, or use illegal drugs. Some items may  interact with your medicine. What should I watch for while using this medication? Your condition will be monitored carefully while you are receiving this medication. This medication may make you feel generally unwell. This is not uncommon as chemotherapy can affect healthy cells as well as cancer cells. Report any side effects. Continue your course of treatment even though you feel ill unless your care team tells you to stop. In some cases, you may be given additional medications to help with side effects. Follow all directions for their use. This medication may increase your risk of getting an infection. Call your care team for advice if you get a fever, chills, sore throat, or other symptoms of a cold or flu. Do not treat yourself. Try to avoid being around people who are sick. This medication may increase your risk to bruise or bleed. Call your care team if you notice any unusual bleeding. Be careful brushing or flossing your teeth or using a toothpick because you may get an infection or bleed more easily. If you have any dental work done, tell your dentist you are receiving this medication. Avoid taking medications that contain aspirin, acetaminophen, ibuprofen, naproxen, or ketoprofen unless instructed by your care team. These medications may hide a fever. Do not treat diarrhea with over the counter products. Contact your care team if you have diarrhea that lasts more than 2 days or if it is severe and watery. This medication can make you more sensitive to the sun. Keep out of the sun. If you cannot avoid being in the sun, wear protective clothing and sunscreen. Do not use sun lamps, tanning beds, or tanning booths. Talk to   your care team if you or your partner wish to become pregnant or think you might be pregnant. This medication can cause serious birth defects if taken during pregnancy and for 3 months after the last dose. A reliable form of contraception is recommended while taking this  medication and for 3 months after the last dose. Talk to your care team about effective forms of contraception. Do not father a child while taking this medication and for 3 months after the last dose. Use a condom while having sex during this time period. Do not breastfeed while taking this medication. This medication may cause infertility. Talk to your care team if you are concerned about your fertility. What side effects may I notice from receiving this medication? Side effects that you should report to your care team as soon as possible: Allergic reactions--skin rash, itching, hives, swelling of the face, lips, tongue, or throat Heart attack--pain or tightness in the chest, shoulders, arms, or jaw, nausea, shortness of breath, cold or clammy skin, feeling faint or lightheaded Heart failure--shortness of breath, swelling of the ankles, feet, or hands, sudden weight gain, unusual weakness or fatigue Heart rhythm changes--fast or irregular heartbeat, dizziness, feeling faint or lightheaded, chest pain, trouble breathing High ammonia level--unusual weakness or fatigue, confusion, loss of appetite, nausea, vomiting, seizures Infection--fever, chills, cough, sore throat, wounds that don't heal, pain or trouble when passing urine, general feeling of discomfort or being unwell Low red blood cell level--unusual weakness or fatigue, dizziness, headache, trouble breathing Pain, tingling, or numbness in the hands or feet, muscle weakness, change in vision, confusion or trouble speaking, loss of balance or coordination, trouble walking, seizures Redness, swelling, and blistering of the skin over hands and feet Severe or prolonged diarrhea Unusual bruising or bleeding Side effects that usually do not require medical attention (report to your care team if they continue or are bothersome): Dry skin Headache Increased tears Nausea Pain, redness, or swelling with sores inside the mouth or throat Sensitivity  to light Vomiting This list may not describe all possible side effects. Call your doctor for medical advice about side effects. You may report side effects to FDA at 1-800-FDA-1088. Where should I keep my medication? This medication is given in a hospital or clinic. It will not be stored at home. NOTE: This sheet is a summary. It may not cover all possible information. If you have questions about this medicine, talk to your doctor, pharmacist, or health care provider.  2023 Elsevier/Gold Standard (2021-09-16 00:00:00)  

## 2022-10-30 NOTE — Telephone Encounter (Signed)
10/30/22 LVM -New injection appts starting on 11/02/22

## 2022-11-02 ENCOUNTER — Inpatient Hospital Stay: Payer: 59 | Attending: Oncology

## 2022-11-02 VITALS — BP 134/90 | HR 94 | Temp 98.1°F | Resp 14 | Ht 68.0 in | Wt 152.0 lb

## 2022-11-02 DIAGNOSIS — Z5111 Encounter for antineoplastic chemotherapy: Secondary | ICD-10-CM | POA: Insufficient documentation

## 2022-11-02 DIAGNOSIS — C7802 Secondary malignant neoplasm of left lung: Secondary | ICD-10-CM | POA: Insufficient documentation

## 2022-11-02 DIAGNOSIS — D709 Neutropenia, unspecified: Secondary | ICD-10-CM | POA: Diagnosis not present

## 2022-11-02 DIAGNOSIS — C189 Malignant neoplasm of colon, unspecified: Secondary | ICD-10-CM | POA: Diagnosis not present

## 2022-11-02 DIAGNOSIS — C7931 Secondary malignant neoplasm of brain: Secondary | ICD-10-CM | POA: Insufficient documentation

## 2022-11-02 DIAGNOSIS — C786 Secondary malignant neoplasm of retroperitoneum and peritoneum: Secondary | ICD-10-CM | POA: Diagnosis not present

## 2022-11-02 DIAGNOSIS — C182 Malignant neoplasm of ascending colon: Secondary | ICD-10-CM

## 2022-11-02 DIAGNOSIS — C787 Secondary malignant neoplasm of liver and intrahepatic bile duct: Secondary | ICD-10-CM | POA: Diagnosis not present

## 2022-11-02 DIAGNOSIS — C7971 Secondary malignant neoplasm of right adrenal gland: Secondary | ICD-10-CM | POA: Insufficient documentation

## 2022-11-02 DIAGNOSIS — Z5189 Encounter for other specified aftercare: Secondary | ICD-10-CM | POA: Diagnosis not present

## 2022-11-02 DIAGNOSIS — C7801 Secondary malignant neoplasm of right lung: Secondary | ICD-10-CM | POA: Insufficient documentation

## 2022-11-02 MED ORDER — FILGRASTIM-SNDZ 480 MCG/0.8ML IJ SOSY
480.0000 ug | PREFILLED_SYRINGE | Freq: Once | INTRAMUSCULAR | Status: AC
  Administered 2022-11-02: 480 ug via SUBCUTANEOUS
  Filled 2022-11-02: qty 0.8

## 2022-11-03 ENCOUNTER — Inpatient Hospital Stay: Payer: 59

## 2022-11-03 VITALS — BP 136/98 | HR 102 | Temp 98.0°F | Resp 16

## 2022-11-03 DIAGNOSIS — C7801 Secondary malignant neoplasm of right lung: Secondary | ICD-10-CM | POA: Diagnosis not present

## 2022-11-03 DIAGNOSIS — Z5189 Encounter for other specified aftercare: Secondary | ICD-10-CM | POA: Diagnosis not present

## 2022-11-03 DIAGNOSIS — C787 Secondary malignant neoplasm of liver and intrahepatic bile duct: Secondary | ICD-10-CM | POA: Diagnosis not present

## 2022-11-03 DIAGNOSIS — C7971 Secondary malignant neoplasm of right adrenal gland: Secondary | ICD-10-CM | POA: Diagnosis not present

## 2022-11-03 DIAGNOSIS — C786 Secondary malignant neoplasm of retroperitoneum and peritoneum: Secondary | ICD-10-CM | POA: Diagnosis not present

## 2022-11-03 DIAGNOSIS — C7931 Secondary malignant neoplasm of brain: Secondary | ICD-10-CM | POA: Diagnosis not present

## 2022-11-03 DIAGNOSIS — C7800 Secondary malignant neoplasm of unspecified lung: Secondary | ICD-10-CM

## 2022-11-03 DIAGNOSIS — C7802 Secondary malignant neoplasm of left lung: Secondary | ICD-10-CM | POA: Diagnosis not present

## 2022-11-03 DIAGNOSIS — Z5111 Encounter for antineoplastic chemotherapy: Secondary | ICD-10-CM | POA: Diagnosis not present

## 2022-11-03 DIAGNOSIS — D709 Neutropenia, unspecified: Secondary | ICD-10-CM | POA: Diagnosis not present

## 2022-11-03 DIAGNOSIS — C182 Malignant neoplasm of ascending colon: Secondary | ICD-10-CM

## 2022-11-03 DIAGNOSIS — C189 Malignant neoplasm of colon, unspecified: Secondary | ICD-10-CM

## 2022-11-03 MED ORDER — FILGRASTIM-SNDZ 480 MCG/0.8ML IJ SOSY
480.0000 ug | PREFILLED_SYRINGE | Freq: Once | INTRAMUSCULAR | Status: AC
  Administered 2022-11-03: 480 ug via SUBCUTANEOUS
  Filled 2022-11-03: qty 0.8

## 2022-11-04 ENCOUNTER — Inpatient Hospital Stay: Payer: 59

## 2022-11-04 VITALS — BP 112/84 | HR 100 | Temp 98.1°F | Resp 16 | Ht 68.0 in | Wt 151.1 lb

## 2022-11-04 DIAGNOSIS — C787 Secondary malignant neoplasm of liver and intrahepatic bile duct: Secondary | ICD-10-CM | POA: Diagnosis not present

## 2022-11-04 DIAGNOSIS — C189 Malignant neoplasm of colon, unspecified: Secondary | ICD-10-CM

## 2022-11-04 DIAGNOSIS — C7931 Secondary malignant neoplasm of brain: Secondary | ICD-10-CM | POA: Diagnosis not present

## 2022-11-04 DIAGNOSIS — C7971 Secondary malignant neoplasm of right adrenal gland: Secondary | ICD-10-CM | POA: Diagnosis not present

## 2022-11-04 DIAGNOSIS — C182 Malignant neoplasm of ascending colon: Secondary | ICD-10-CM

## 2022-11-04 DIAGNOSIS — C786 Secondary malignant neoplasm of retroperitoneum and peritoneum: Secondary | ICD-10-CM | POA: Diagnosis not present

## 2022-11-04 DIAGNOSIS — D709 Neutropenia, unspecified: Secondary | ICD-10-CM | POA: Diagnosis not present

## 2022-11-04 DIAGNOSIS — C7802 Secondary malignant neoplasm of left lung: Secondary | ICD-10-CM | POA: Diagnosis not present

## 2022-11-04 DIAGNOSIS — C7801 Secondary malignant neoplasm of right lung: Secondary | ICD-10-CM | POA: Diagnosis not present

## 2022-11-04 DIAGNOSIS — Z5189 Encounter for other specified aftercare: Secondary | ICD-10-CM | POA: Diagnosis not present

## 2022-11-04 DIAGNOSIS — Z5111 Encounter for antineoplastic chemotherapy: Secondary | ICD-10-CM | POA: Diagnosis not present

## 2022-11-04 MED ORDER — FILGRASTIM-SNDZ 480 MCG/0.8ML IJ SOSY
480.0000 ug | PREFILLED_SYRINGE | Freq: Once | INTRAMUSCULAR | Status: AC
  Administered 2022-11-04: 480 ug via SUBCUTANEOUS
  Filled 2022-11-04: qty 0.8

## 2022-11-05 ENCOUNTER — Other Ambulatory Visit: Payer: 59

## 2022-11-05 ENCOUNTER — Inpatient Hospital Stay: Payer: 59

## 2022-11-05 ENCOUNTER — Ambulatory Visit: Payer: 59 | Admitting: Oncology

## 2022-11-05 VITALS — BP 108/84 | HR 108 | Temp 98.2°F | Resp 18 | Ht 68.0 in | Wt 148.0 lb

## 2022-11-05 DIAGNOSIS — Z5189 Encounter for other specified aftercare: Secondary | ICD-10-CM | POA: Diagnosis not present

## 2022-11-05 DIAGNOSIS — C786 Secondary malignant neoplasm of retroperitoneum and peritoneum: Secondary | ICD-10-CM | POA: Diagnosis not present

## 2022-11-05 DIAGNOSIS — D709 Neutropenia, unspecified: Secondary | ICD-10-CM | POA: Diagnosis not present

## 2022-11-05 DIAGNOSIS — C7802 Secondary malignant neoplasm of left lung: Secondary | ICD-10-CM | POA: Diagnosis not present

## 2022-11-05 DIAGNOSIS — C7971 Secondary malignant neoplasm of right adrenal gland: Secondary | ICD-10-CM | POA: Diagnosis not present

## 2022-11-05 DIAGNOSIS — C7931 Secondary malignant neoplasm of brain: Secondary | ICD-10-CM | POA: Diagnosis not present

## 2022-11-05 DIAGNOSIS — C7801 Secondary malignant neoplasm of right lung: Secondary | ICD-10-CM | POA: Diagnosis not present

## 2022-11-05 DIAGNOSIS — C189 Malignant neoplasm of colon, unspecified: Secondary | ICD-10-CM | POA: Diagnosis not present

## 2022-11-05 DIAGNOSIS — C182 Malignant neoplasm of ascending colon: Secondary | ICD-10-CM

## 2022-11-05 DIAGNOSIS — C787 Secondary malignant neoplasm of liver and intrahepatic bile duct: Secondary | ICD-10-CM

## 2022-11-05 DIAGNOSIS — Z5111 Encounter for antineoplastic chemotherapy: Secondary | ICD-10-CM | POA: Diagnosis not present

## 2022-11-05 MED ORDER — FILGRASTIM-SNDZ 480 MCG/0.8ML IJ SOSY
480.0000 ug | PREFILLED_SYRINGE | Freq: Once | INTRAMUSCULAR | Status: AC
  Administered 2022-11-05: 480 ug via SUBCUTANEOUS
  Filled 2022-11-05: qty 0.8

## 2022-11-05 NOTE — Patient Instructions (Signed)
Filgrastim Injection What is this medication? FILGRASTIM (fil GRA stim) lowers the risk of infection in people who are receiving chemotherapy. It works by helping your body make more white blood cells, which protects your body from infection. It may also be used to help people who have been exposed to high doses of radiation. It can be used to help prepare your body before a stem cell transplant. It works by helping your bone marrow make and release stem cells into the blood. This medicine may be used for other purposes; ask your health care provider or pharmacist if you have questions. COMMON BRAND NAME(S): Neupogen, Nivestym, Releuko, Zarxio What should I tell my care team before I take this medication? They need to know if you have any of these conditions: History of blood diseases, such as sickle cell anemia Kidney disease Recent or ongoing radiation An unusual or allergic reaction to filgrastim, pegfilgrastim, latex, rubber, other medications, foods, dyes, or preservatives Pregnant or trying to get pregnant Breast-feeding How should I use this medication? This medication is injected under the skin or into a vein. It is usually given by your care team in a hospital or clinic setting. It may be given at home. If you get this medication at home, you will be taught how to prepare and give it. Use exactly as directed. Take it as directed on the prescription label at the same time every day. Keep taking it unless your care team tells you to stop. It is important that you put your used needles and syringes in a special sharps container. Do not put them in a trash can. If you do not have a sharps container, call your pharmacist or care team to get one. This medication comes with INSTRUCTIONS FOR USE. Ask your pharmacist for directions on how to use this medication. Read the information carefully. Talk to your pharmacist or care team if you have questions. Talk to your care team about the use of this  medication in children. While it may be prescribed for children for selected conditions, precautions do apply. Overdosage: If you think you have taken too much of this medicine contact a poison control center or emergency room at once. NOTE: This medicine is only for you. Do not share this medicine with others. What if I miss a dose? It is important not to miss any doses. Talk to your care team about what to do if you miss a dose. What may interact with this medication? Medications that may cause a release of neutrophils, such as lithium This list may not describe all possible interactions. Give your health care provider a list of all the medicines, herbs, non-prescription drugs, or dietary supplements you use. Also tell them if you smoke, drink alcohol, or use illegal drugs. Some items may interact with your medicine. What should I watch for while using this medication? Your condition will be monitored carefully while you are receiving this medication. You may need bloodwork while taking this medication. Talk to your care team about your risk of cancer. You may be more at risk for certain types of cancer if you take this medication. What side effects may I notice from receiving this medication? Side effects that you should report to your care team as soon as possible: Allergic reactions--skin rash, itching, hives, swelling of the face, lips, tongue, or throat Capillary leak syndrome--stomach or muscle pain, unusual weakness or fatigue, feeling faint or lightheaded, decrease in the amount of urine, swelling of the ankles, hands, or   feet, trouble breathing High white blood cell level--fever, fatigue, trouble breathing, night sweats, change in vision, weight loss Inflammation of the aorta--fever, fatigue, back, chest, or stomach pain, severe headache Kidney injury (glomerulonephritis)--decrease in the amount of urine, red or dark brown urine, foamy or bubbly urine, swelling of the ankles, hands, or  feet Shortness of breath or trouble breathing Spleen injury--pain in upper left stomach or shoulder Unusual bruising or bleeding Side effects that usually do not require medical attention (report to your care team if they continue or are bothersome): Back pain Bone pain Fatigue Fever Headache Nausea This list may not describe all possible side effects. Call your doctor for medical advice about side effects. You may report side effects to FDA at 1-800-FDA-1088. Where should I keep my medication? Keep out of the reach of children and pets. Keep this medication in the original packaging until you are ready to take it. Protect from light. See product for storage information. Each product may have different instructions. Get rid of any unused medication after the expiration date. To get rid of medications that are no longer needed or have expired: Take the medication to a medications take-back program. Check with your pharmacy or law enforcement to find a location. If you cannot return the medication, ask your pharmacist or care team how to get rid of this medication safely. NOTE: This sheet is a summary. It may not cover all possible information. If you have questions about this medicine, talk to your doctor, pharmacist, or health care provider.  2024 Elsevier/Gold Standard (2021-10-09 00:00:00)  

## 2022-11-06 ENCOUNTER — Inpatient Hospital Stay: Payer: 59

## 2022-11-06 ENCOUNTER — Encounter: Payer: Self-pay | Admitting: Oncology

## 2022-11-06 VITALS — BP 108/86 | HR 112 | Temp 99.0°F | Resp 14 | Ht 68.0 in | Wt 151.0 lb

## 2022-11-06 DIAGNOSIS — Z5189 Encounter for other specified aftercare: Secondary | ICD-10-CM | POA: Diagnosis not present

## 2022-11-06 DIAGNOSIS — C189 Malignant neoplasm of colon, unspecified: Secondary | ICD-10-CM | POA: Diagnosis not present

## 2022-11-06 DIAGNOSIS — C7971 Secondary malignant neoplasm of right adrenal gland: Secondary | ICD-10-CM | POA: Diagnosis not present

## 2022-11-06 DIAGNOSIS — C7801 Secondary malignant neoplasm of right lung: Secondary | ICD-10-CM | POA: Diagnosis not present

## 2022-11-06 DIAGNOSIS — D709 Neutropenia, unspecified: Secondary | ICD-10-CM | POA: Diagnosis not present

## 2022-11-06 DIAGNOSIS — C7931 Secondary malignant neoplasm of brain: Secondary | ICD-10-CM | POA: Diagnosis not present

## 2022-11-06 DIAGNOSIS — C786 Secondary malignant neoplasm of retroperitoneum and peritoneum: Secondary | ICD-10-CM | POA: Diagnosis not present

## 2022-11-06 DIAGNOSIS — Z5111 Encounter for antineoplastic chemotherapy: Secondary | ICD-10-CM | POA: Diagnosis not present

## 2022-11-06 DIAGNOSIS — C787 Secondary malignant neoplasm of liver and intrahepatic bile duct: Secondary | ICD-10-CM | POA: Diagnosis not present

## 2022-11-06 DIAGNOSIS — C182 Malignant neoplasm of ascending colon: Secondary | ICD-10-CM

## 2022-11-06 DIAGNOSIS — C7802 Secondary malignant neoplasm of left lung: Secondary | ICD-10-CM | POA: Diagnosis not present

## 2022-11-06 MED ORDER — FILGRASTIM-SNDZ 480 MCG/0.8ML IJ SOSY
480.0000 ug | PREFILLED_SYRINGE | Freq: Once | INTRAMUSCULAR | Status: AC
  Administered 2022-11-06: 480 ug via SUBCUTANEOUS
  Filled 2022-11-06: qty 0.8

## 2022-11-08 NOTE — Progress Notes (Unsigned)
[facility]  [address] [place],  [place]  [address] [phone]  Clinic Day:  11/09/2022  Referring physician: Alinda Deem, MD  HISTORY OF PRESENT ILLNESS:  The patient is a 57 y.o. female with  metastatic colon cancer, which includes spread of disease to her abdominal cavity, lungs, brain and right adrenal gland. Recent CT scans showed disease improvement to where she remains on FOLFOX/Avastinat [facility] showed 3 liver metastases.   She comes in today to be evaluated before heading into her 6th cycle of FOLFOX/Avastin.  The patient claims to have tolerated her 5th  cycle of this chemotherapy regimen fairly well.  She did have minimal nausea with cycle #5 of treatment, which was managed with oral antiemetics.  Her daily quality of life remains great.  She denies having any new GI symptoms or findings which concern her for overt signs of disease progression.   With respect to her colon cancer history, she is status post a right hemicolectomy in early October 2018, followed by 12 cycles of adjuvant FOLFOX chemotherapy, which were completed in April 2019.  In December 2021, she underwent a left cerebellar metastasectomy, whose pathology was consistent with metastatic colon cancer.  Tumor testing did come back MMR normal. CT scans revealed evidence of her cancer being in multiple locations, for which she took 40 cycles of FOLFIRI/Avastin.  As follow-up scans revealed liver metastasis, she has been placed back on FOLFOX/Avastin.     PHYSICAL EXAM:  Blood pressure 135/73, pulse 100, temperature 98.8 F (37.1 C), resp. rate 16, height 5\' 8"  (1.727 m), weight 152 lb 12.8 oz (69.3 kg), last menstrual period 02/22/2019, SpO2 97 %. Body mass index is 23.23 kg/m.  Performance status (ECOG): 1 - Symptomatic but completely ambulatory  Physical Exam Vitals and nursing note reviewed.  Constitutional:      General: She is not in acute distress.    Appearance: Normal  appearance.  HENT:     Head: Normocephalic and atraumatic.     Mouth/Throat:     Mouth: Mucous membranes are moist.     Pharynx: Oropharynx is clear. No oropharyngeal exudate or posterior oropharyngeal erythema.  Eyes:     General: No scleral icterus.    Extraocular Movements: Extraocular movements intact.     Conjunctiva/sclera: Conjunctivae normal.     Pupils: Pupils are equal, round, and reactive to light.  Cardiovascular:     Rate and Rhythm: Normal rate and regular rhythm.     Heart sounds: Normal heart sounds. No murmur heard.    No friction rub. No gallop.  Pulmonary:     Effort: Pulmonary effort is normal.     Breath sounds: Normal breath sounds. No wheezing, rhonchi or rales.  Abdominal:     General: There is no distension.     Palpations: Abdomen is soft. There is no hepatomegaly, splenomegaly or mass.     Tenderness: There is no abdominal tenderness.  Musculoskeletal:        General: Normal range of motion.     Cervical back: Normal range of motion and neck supple. No tenderness.     Right lower leg: No edema.     Left lower leg: No edema.  Lymphadenopathy:     Cervical: No cervical adenopathy.     Upper Body:     Right upper body: No supraclavicular or axillary adenopathy.     Left upper body: No supraclavicular or axillary adenopathy.     Lower Body: No  right inguinal adenopathy. No left inguinal adenopathy.  Skin:    General: Skin is warm and dry.     Coloration: Skin is not jaundiced.     Findings: No rash.  Neurological:     Mental Status: She is alert and oriented to person, place, and time.     Cranial Nerves: No cranial nerve deficit.  Psychiatric:        Mood and Affect: Mood normal.        Behavior: Behavior normal.        Thought Content: Thought content normal.    LABS:       Latest Ref Rng & Units 10/27/2022   12:00 AM 10/23/2022   12:00 AM 10/22/2022   12:00 AM  CBC  WBC  2.5     2.6     2.8      Hemoglobin 12.0 - 16.0 12.8     12.0      12.7      Hematocrit 36 - 46 37     36     38      Platelets 150 - 400 K/uL 96     72     77         This result is from an external source.      Latest Ref Rng & Units 10/23/2022   12:00 AM 10/12/2022    1:37 PM 09/28/2022    2:18 PM  CMP  Glucose 70 - 99 mg/dL  77  93   BUN 4 - 21 18     15  16    Creatinine 0.5 - 1.1 1.1     1.35  1.31   Sodium 137 - 147 138     141  140   Potassium 3.5 - 5.1 mEq/L 3.9     3.5  4.3   Chloride 99 - 108 105     107  104   CO2 13 - 22 23     23  25    Calcium 8.7 - 10.7 9.1     9.1  9.2   Total Protein 6.5 - 8.1 g/dL  6.7  6.6   Total Bilirubin 0.3 - 1.2 mg/dL  0.5  0.5   Alkaline Phos 25 - 125 101     102  92   AST 13 - 35 28     26  24    ALT 7 - 35 U/L 19     22  21       This result is from an external source.   ASSESSMENT & PLAN:  Assessment/Plan:  A 58 y.o. female with metastatic colon cancer.  He will proceed with her 6th cycle of FOLFOX/Avastin this week.  Clinically, the patient appears to be doing very well.  I will see her back in 2 weeks before she heads into her 7th cycle of FOLFOX/Avastin.  The patient understands all the plans discussed today and is in agreement with them.  Rhyanna Sorce Kirby Funk, MD

## 2022-11-09 ENCOUNTER — Inpatient Hospital Stay (HOSPITAL_BASED_OUTPATIENT_CLINIC_OR_DEPARTMENT_OTHER): Payer: 59 | Admitting: Oncology

## 2022-11-09 ENCOUNTER — Inpatient Hospital Stay: Payer: 59

## 2022-11-09 DIAGNOSIS — Z5111 Encounter for antineoplastic chemotherapy: Secondary | ICD-10-CM | POA: Diagnosis not present

## 2022-11-09 DIAGNOSIS — C787 Secondary malignant neoplasm of liver and intrahepatic bile duct: Secondary | ICD-10-CM | POA: Diagnosis not present

## 2022-11-09 DIAGNOSIS — D709 Neutropenia, unspecified: Secondary | ICD-10-CM | POA: Diagnosis not present

## 2022-11-09 DIAGNOSIS — C7802 Secondary malignant neoplasm of left lung: Secondary | ICD-10-CM | POA: Diagnosis not present

## 2022-11-09 DIAGNOSIS — C786 Secondary malignant neoplasm of retroperitoneum and peritoneum: Secondary | ICD-10-CM | POA: Diagnosis not present

## 2022-11-09 DIAGNOSIS — C7800 Secondary malignant neoplasm of unspecified lung: Secondary | ICD-10-CM

## 2022-11-09 DIAGNOSIS — C189 Malignant neoplasm of colon, unspecified: Secondary | ICD-10-CM

## 2022-11-09 DIAGNOSIS — C182 Malignant neoplasm of ascending colon: Secondary | ICD-10-CM | POA: Diagnosis not present

## 2022-11-09 DIAGNOSIS — Z5189 Encounter for other specified aftercare: Secondary | ICD-10-CM | POA: Diagnosis not present

## 2022-11-09 DIAGNOSIS — C7801 Secondary malignant neoplasm of right lung: Secondary | ICD-10-CM | POA: Diagnosis not present

## 2022-11-09 DIAGNOSIS — C7931 Secondary malignant neoplasm of brain: Secondary | ICD-10-CM | POA: Diagnosis not present

## 2022-11-09 DIAGNOSIS — C7971 Secondary malignant neoplasm of right adrenal gland: Secondary | ICD-10-CM | POA: Diagnosis not present

## 2022-11-09 LAB — CMP (CANCER CENTER ONLY)
ALT: 19 U/L (ref 0–44)
AST: 25 U/L (ref 15–41)
Albumin: 3.5 g/dL (ref 3.5–5.0)
Alkaline Phosphatase: 128 U/L — ABNORMAL HIGH (ref 38–126)
Anion gap: 9 (ref 5–15)
BUN: 14 mg/dL (ref 6–20)
CO2: 24 mmol/L (ref 22–32)
Calcium: 9 mg/dL (ref 8.9–10.3)
Chloride: 107 mmol/L (ref 98–111)
Creatinine: 1.35 mg/dL — ABNORMAL HIGH (ref 0.44–1.00)
GFR, Estimated: 46 mL/min — ABNORMAL LOW (ref >60)
Glucose, Bld: 143 mg/dL — ABNORMAL HIGH (ref 70–99)
Potassium: 3.8 mmol/L (ref 3.5–5.1)
Sodium: 140 mmol/L (ref 135–145)
Total Bilirubin: 0.5 mg/dL (ref 0.3–1.2)
Total Protein: 6.1 g/dL — ABNORMAL LOW (ref 6.5–8.1)

## 2022-11-09 LAB — CBC AND DIFFERENTIAL
HCT: 35 — AB (ref 36–46)
Hemoglobin: 11.7 — AB (ref 12.0–16.0)
Neutrophils Absolute: 2.21
Platelets: 84 10*3/uL — AB (ref 150–400)
WBC: 4.1

## 2022-11-09 LAB — CBC: RBC: 3.65 — AB (ref 3.87–5.11)

## 2022-11-10 ENCOUNTER — Other Ambulatory Visit: Payer: Self-pay | Admitting: Oncology

## 2022-11-10 DIAGNOSIS — C7800 Secondary malignant neoplasm of unspecified lung: Secondary | ICD-10-CM

## 2022-11-10 DIAGNOSIS — C182 Malignant neoplasm of ascending colon: Secondary | ICD-10-CM

## 2022-11-10 DIAGNOSIS — K1230 Oral mucositis (ulcerative), unspecified: Secondary | ICD-10-CM

## 2022-11-10 DIAGNOSIS — C189 Malignant neoplasm of colon, unspecified: Secondary | ICD-10-CM

## 2022-11-10 MED FILL — Fosaprepitant Dimeglumine For IV Infusion 150 MG (Base Eq): INTRAVENOUS | Qty: 5 | Status: AC

## 2022-11-10 MED FILL — Dexamethasone Sodium Phosphate Inj 100 MG/10ML: INTRAMUSCULAR | Qty: 1 | Status: AC

## 2022-11-11 ENCOUNTER — Inpatient Hospital Stay: Payer: 59

## 2022-11-11 VITALS — BP 135/88 | HR 99 | Temp 98.6°F | Resp 14 | Ht 68.0 in | Wt 152.0 lb

## 2022-11-11 DIAGNOSIS — D709 Neutropenia, unspecified: Secondary | ICD-10-CM | POA: Diagnosis not present

## 2022-11-11 DIAGNOSIS — C182 Malignant neoplasm of ascending colon: Secondary | ICD-10-CM

## 2022-11-11 DIAGNOSIS — C787 Secondary malignant neoplasm of liver and intrahepatic bile duct: Secondary | ICD-10-CM | POA: Diagnosis not present

## 2022-11-11 DIAGNOSIS — C786 Secondary malignant neoplasm of retroperitoneum and peritoneum: Secondary | ICD-10-CM | POA: Diagnosis not present

## 2022-11-11 DIAGNOSIS — C7801 Secondary malignant neoplasm of right lung: Secondary | ICD-10-CM | POA: Diagnosis not present

## 2022-11-11 DIAGNOSIS — C7931 Secondary malignant neoplasm of brain: Secondary | ICD-10-CM | POA: Diagnosis not present

## 2022-11-11 DIAGNOSIS — C189 Malignant neoplasm of colon, unspecified: Secondary | ICD-10-CM

## 2022-11-11 DIAGNOSIS — C7971 Secondary malignant neoplasm of right adrenal gland: Secondary | ICD-10-CM | POA: Diagnosis not present

## 2022-11-11 DIAGNOSIS — Z5189 Encounter for other specified aftercare: Secondary | ICD-10-CM | POA: Diagnosis not present

## 2022-11-11 DIAGNOSIS — Z5111 Encounter for antineoplastic chemotherapy: Secondary | ICD-10-CM | POA: Diagnosis not present

## 2022-11-11 DIAGNOSIS — C7802 Secondary malignant neoplasm of left lung: Secondary | ICD-10-CM | POA: Diagnosis not present

## 2022-11-11 MED ORDER — SODIUM CHLORIDE 0.9 % IV SOLN
2400.0000 mg/m2 | INTRAVENOUS | Status: DC
  Administered 2022-11-11: 4350 mg via INTRAVENOUS
  Filled 2022-11-11: qty 87

## 2022-11-11 MED ORDER — SODIUM CHLORIDE 0.9 % IV SOLN
150.0000 mg | Freq: Once | INTRAVENOUS | Status: AC
  Administered 2022-11-11: 150 mg via INTRAVENOUS
  Filled 2022-11-11: qty 150

## 2022-11-11 MED ORDER — FLUOROURACIL CHEMO INJECTION 2.5 GM/50ML
400.0000 mg/m2 | Freq: Once | INTRAVENOUS | Status: AC
  Administered 2022-11-11: 700 mg via INTRAVENOUS
  Filled 2022-11-11: qty 14

## 2022-11-11 MED ORDER — FAMOTIDINE IN NACL 20-0.9 MG/50ML-% IV SOLN
20.0000 mg | Freq: Once | INTRAVENOUS | Status: AC
  Administered 2022-11-11: 20 mg via INTRAVENOUS
  Filled 2022-11-11: qty 50

## 2022-11-11 MED ORDER — DEXTROSE 5 % IV SOLN: Freq: Once | INTRAVENOUS | Status: AC

## 2022-11-11 MED ORDER — SODIUM CHLORIDE 0.9 % IV SOLN: Freq: Once | INTRAVENOUS | Status: AC

## 2022-11-11 MED ORDER — SODIUM CHLORIDE 0.9% FLUSH: 10.0000 mL | INTRAVENOUS | Status: DC | PRN

## 2022-11-11 MED ORDER — DIPHENHYDRAMINE HCL 50 MG/ML IJ SOLN
25.0000 mg | Freq: Once | INTRAMUSCULAR | Status: AC
  Administered 2022-11-11: 25 mg via INTRAVENOUS
  Filled 2022-11-11: qty 1

## 2022-11-11 MED ORDER — OXALIPLATIN CHEMO INJECTION 100 MG/20ML
85.0000 mg/m2 | Freq: Once | INTRAVENOUS | Status: AC
  Administered 2022-11-11: 150 mg via INTRAVENOUS
  Filled 2022-11-11: qty 20

## 2022-11-11 MED ORDER — SODIUM CHLORIDE 0.9 % IV SOLN
10.0000 mg | Freq: Once | INTRAVENOUS | Status: AC
  Administered 2022-11-11: 10 mg via INTRAVENOUS
  Filled 2022-11-11: qty 10

## 2022-11-11 MED ORDER — PALONOSETRON HCL INJECTION 0.25 MG/5ML
0.2500 mg | Freq: Once | INTRAVENOUS | Status: AC
  Administered 2022-11-11: 0.25 mg via INTRAVENOUS
  Filled 2022-11-11: qty 5

## 2022-11-11 MED ORDER — HEPARIN SOD (PORK) LOCK FLUSH 100 UNIT/ML IV SOLN: 500.0000 [IU] | Freq: Once | INTRAVENOUS | Status: DC | PRN

## 2022-11-11 MED ORDER — SODIUM CHLORIDE 0.9 % IV SOLN
5.0000 mg/kg | Freq: Once | INTRAVENOUS | Status: AC
  Administered 2022-11-11: 350 mg via INTRAVENOUS
  Filled 2022-11-11: qty 14

## 2022-11-12 ENCOUNTER — Encounter: Payer: Self-pay | Admitting: Oncology

## 2022-11-12 LAB — CEA: CEA: 14.2 ng/mL — ABNORMAL HIGH (ref 0.0–4.7)

## 2022-11-13 ENCOUNTER — Inpatient Hospital Stay: Payer: 59

## 2022-11-13 VITALS — BP 134/88 | HR 91 | Resp 18 | Wt 152.0 lb

## 2022-11-13 DIAGNOSIS — C189 Malignant neoplasm of colon, unspecified: Secondary | ICD-10-CM

## 2022-11-13 DIAGNOSIS — C182 Malignant neoplasm of ascending colon: Secondary | ICD-10-CM

## 2022-11-13 MED ORDER — HEPARIN SOD (PORK) LOCK FLUSH 100 UNIT/ML IV SOLN: 500.0000 [IU] | Freq: Once | INTRAVENOUS | Status: DC | PRN

## 2022-11-13 MED ORDER — SODIUM CHLORIDE 0.9% FLUSH: 10.0000 mL | INTRAVENOUS | Status: DC | PRN

## 2022-11-16 ENCOUNTER — Inpatient Hospital Stay: Payer: 59

## 2022-11-16 VITALS — BP 111/82 | HR 96 | Temp 97.5°F | Resp 16 | Ht 68.0 in | Wt 153.1 lb

## 2022-11-16 DIAGNOSIS — C7971 Secondary malignant neoplasm of right adrenal gland: Secondary | ICD-10-CM | POA: Diagnosis not present

## 2022-11-16 DIAGNOSIS — C786 Secondary malignant neoplasm of retroperitoneum and peritoneum: Secondary | ICD-10-CM | POA: Diagnosis not present

## 2022-11-16 DIAGNOSIS — Z5189 Encounter for other specified aftercare: Secondary | ICD-10-CM | POA: Diagnosis not present

## 2022-11-16 DIAGNOSIS — C7800 Secondary malignant neoplasm of unspecified lung: Secondary | ICD-10-CM

## 2022-11-16 DIAGNOSIS — C189 Malignant neoplasm of colon, unspecified: Secondary | ICD-10-CM

## 2022-11-16 DIAGNOSIS — C7802 Secondary malignant neoplasm of left lung: Secondary | ICD-10-CM | POA: Diagnosis not present

## 2022-11-16 DIAGNOSIS — C787 Secondary malignant neoplasm of liver and intrahepatic bile duct: Secondary | ICD-10-CM | POA: Diagnosis not present

## 2022-11-16 DIAGNOSIS — D709 Neutropenia, unspecified: Secondary | ICD-10-CM | POA: Diagnosis not present

## 2022-11-16 DIAGNOSIS — C182 Malignant neoplasm of ascending colon: Secondary | ICD-10-CM

## 2022-11-16 DIAGNOSIS — C7931 Secondary malignant neoplasm of brain: Secondary | ICD-10-CM | POA: Diagnosis not present

## 2022-11-16 DIAGNOSIS — C7801 Secondary malignant neoplasm of right lung: Secondary | ICD-10-CM | POA: Diagnosis not present

## 2022-11-16 DIAGNOSIS — Z5111 Encounter for antineoplastic chemotherapy: Secondary | ICD-10-CM | POA: Diagnosis not present

## 2022-11-16 MED ORDER — FILGRASTIM-SNDZ 480 MCG/0.8ML IJ SOSY
480.0000 ug | PREFILLED_SYRINGE | Freq: Once | INTRAMUSCULAR | Status: AC
  Administered 2022-11-16: 480 ug via SUBCUTANEOUS
  Filled 2022-11-16: qty 0.8

## 2022-11-16 MED ORDER — NYSTATIN 100000 UNIT/ML MT SUSP
5.0000 mL | Freq: Three times a day (TID) | ORAL | 0 refills | Status: DC
Start: 2022-11-16 — End: 2023-06-07

## 2022-11-17 ENCOUNTER — Inpatient Hospital Stay: Payer: 59

## 2022-11-17 ENCOUNTER — Ambulatory Visit: Payer: 59

## 2022-11-17 VITALS — BP 122/83 | HR 103 | Temp 98.4°F | Resp 18 | Ht 68.0 in | Wt 153.0 lb

## 2022-11-17 DIAGNOSIS — C189 Malignant neoplasm of colon, unspecified: Secondary | ICD-10-CM

## 2022-11-17 DIAGNOSIS — C7931 Secondary malignant neoplasm of brain: Secondary | ICD-10-CM | POA: Diagnosis not present

## 2022-11-17 DIAGNOSIS — C7971 Secondary malignant neoplasm of right adrenal gland: Secondary | ICD-10-CM | POA: Diagnosis not present

## 2022-11-17 DIAGNOSIS — Z5111 Encounter for antineoplastic chemotherapy: Secondary | ICD-10-CM | POA: Diagnosis not present

## 2022-11-17 DIAGNOSIS — C7802 Secondary malignant neoplasm of left lung: Secondary | ICD-10-CM | POA: Diagnosis not present

## 2022-11-17 DIAGNOSIS — D709 Neutropenia, unspecified: Secondary | ICD-10-CM | POA: Diagnosis not present

## 2022-11-17 DIAGNOSIS — C786 Secondary malignant neoplasm of retroperitoneum and peritoneum: Secondary | ICD-10-CM | POA: Diagnosis not present

## 2022-11-17 DIAGNOSIS — C787 Secondary malignant neoplasm of liver and intrahepatic bile duct: Secondary | ICD-10-CM | POA: Diagnosis not present

## 2022-11-17 DIAGNOSIS — C7801 Secondary malignant neoplasm of right lung: Secondary | ICD-10-CM | POA: Diagnosis not present

## 2022-11-17 DIAGNOSIS — C182 Malignant neoplasm of ascending colon: Secondary | ICD-10-CM

## 2022-11-17 DIAGNOSIS — Z5189 Encounter for other specified aftercare: Secondary | ICD-10-CM | POA: Diagnosis not present

## 2022-11-17 MED ORDER — FILGRASTIM-SNDZ 480 MCG/0.8ML IJ SOSY
480.0000 ug | PREFILLED_SYRINGE | Freq: Once | INTRAMUSCULAR | Status: AC
  Administered 2022-11-17: 480 ug via SUBCUTANEOUS
  Filled 2022-11-17: qty 0.8

## 2022-11-18 ENCOUNTER — Inpatient Hospital Stay: Payer: 59

## 2022-11-18 VITALS — BP 123/78 | HR 94 | Temp 98.2°F | Resp 16 | Ht 68.0 in | Wt 154.0 lb

## 2022-11-18 DIAGNOSIS — D709 Neutropenia, unspecified: Secondary | ICD-10-CM | POA: Diagnosis not present

## 2022-11-18 DIAGNOSIS — C7931 Secondary malignant neoplasm of brain: Secondary | ICD-10-CM | POA: Diagnosis not present

## 2022-11-18 DIAGNOSIS — Z5189 Encounter for other specified aftercare: Secondary | ICD-10-CM | POA: Diagnosis not present

## 2022-11-18 DIAGNOSIS — C786 Secondary malignant neoplasm of retroperitoneum and peritoneum: Secondary | ICD-10-CM | POA: Diagnosis not present

## 2022-11-18 DIAGNOSIS — C189 Malignant neoplasm of colon, unspecified: Secondary | ICD-10-CM | POA: Diagnosis not present

## 2022-11-18 DIAGNOSIS — C7801 Secondary malignant neoplasm of right lung: Secondary | ICD-10-CM | POA: Diagnosis not present

## 2022-11-18 DIAGNOSIS — C7971 Secondary malignant neoplasm of right adrenal gland: Secondary | ICD-10-CM | POA: Diagnosis not present

## 2022-11-18 DIAGNOSIS — Z5111 Encounter for antineoplastic chemotherapy: Secondary | ICD-10-CM | POA: Diagnosis not present

## 2022-11-18 DIAGNOSIS — C182 Malignant neoplasm of ascending colon: Secondary | ICD-10-CM

## 2022-11-18 DIAGNOSIS — C7802 Secondary malignant neoplasm of left lung: Secondary | ICD-10-CM | POA: Diagnosis not present

## 2022-11-18 DIAGNOSIS — C787 Secondary malignant neoplasm of liver and intrahepatic bile duct: Secondary | ICD-10-CM | POA: Diagnosis not present

## 2022-11-18 MED ORDER — FILGRASTIM-SNDZ 480 MCG/0.8ML IJ SOSY
480.0000 ug | PREFILLED_SYRINGE | Freq: Once | INTRAMUSCULAR | Status: AC
  Administered 2022-11-18: 480 ug via SUBCUTANEOUS
  Filled 2022-11-18: qty 0.8

## 2022-11-22 NOTE — Progress Notes (Signed)
[facility]  [address] [place],  [place]  [address] [phone]  Clinic Day:  11/23/2022  Referring physician: Alinda Deem, MD  HISTORY OF PRESENT ILLNESS:  The patient is a 57 y.o. female with  metastatic colon cancer, which includes spread of disease to her abdominal cavity, lungs, brain and right adrenal gland.  She comes in today to be evaluated before heading into her 7th cycle of FOLFOX/Avastin.  The patient claims to have tolerated her 6th cycle of this chemotherapy regimen fairly well.  She did have more diarrhea with cycle 6 to where she needed more Imodium for relief.  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  However, her finger/hand functions have not been hampered to where she feels a dose reduction with her oxaliplatin has been necessary.  She denies having any new GI symptoms or findings which concern her for overt signs of disease progression.   With respect to her colon cancer history, she is status post a right hemicolectomy in early October 2018, followed by 12 cycles of adjuvant FOLFOX chemotherapy, which were completed in April 2019.  In December 2021, she underwent a left cerebellar metastasectomy, whose pathology was consistent with metastatic colon cancer.  Tumor testing did come back MMR normal. CT scans revealed evidence of her cancer being in multiple locations, for which she took 40 cycles of FOLFIRI/Avastin.  As follow-up scans revealed liver metastasis, she has been placed back on FOLFOX/Avastin.     PHYSICAL EXAM:  Blood pressure 120/75, pulse 93, temperature 98.8 F (37.1 C), resp. rate 16, height 5\' 8"  (1.727 m), weight 156 lb 6.4 oz (70.9 kg), last menstrual period 02/22/2019, SpO2 100 %. Body mass index is 23.78 kg/m.  Performance status (ECOG): 1 - Symptomatic but completely ambulatory  Physical Exam Vitals and nursing note reviewed.  Constitutional:      General: She is not  in acute distress.    Appearance: Normal appearance.  HENT:     Head: Normocephalic and atraumatic.     Mouth/Throat:     Mouth: Mucous membranes are moist.     Pharynx: Oropharynx is clear. No oropharyngeal exudate or posterior oropharyngeal erythema.  Eyes:     General: No scleral icterus.    Extraocular Movements: Extraocular movements intact.     Conjunctiva/sclera: Conjunctivae normal.     Pupils: Pupils are equal, round, and reactive to light.  Cardiovascular:     Rate and Rhythm: Normal rate and regular rhythm.     Heart sounds: Normal heart sounds. No murmur heard.    No friction rub. No gallop.  Pulmonary:     Effort: Pulmonary effort is normal.     Breath sounds: Normal breath sounds. No wheezing, rhonchi or rales.  Abdominal:     General: There is no distension.     Palpations: Abdomen is soft. There is no hepatomegaly, splenomegaly or mass.     Tenderness: There is no abdominal tenderness.  Musculoskeletal:        General: Normal range of motion.     Cervical back: Normal range of motion and neck supple. No tenderness.     Right lower leg: No edema.     Left lower leg: No edema.  Lymphadenopathy:     Cervical: No cervical adenopathy.     Upper Body:     Right upper body: No supraclavicular or axillary adenopathy.     Left upper body: No supraclavicular or  axillary adenopathy.     Lower Body: No right inguinal adenopathy. No left inguinal adenopathy.  Skin:    General: Skin is warm and dry.     Coloration: Skin is not jaundiced.     Findings: No rash.  Neurological:     Mental Status: She is alert and oriented to person, place, and time.     Cranial Nerves: No cranial nerve deficit.  Psychiatric:        Mood and Affect: Mood normal.        Behavior: Behavior normal.        Thought Content: Thought content normal.    LABS:        Latest Ref Rng & Units 11/09/2022   12:00 AM 10/27/2022   12:00 AM 10/23/2022   12:00 AM  CBC  WBC  4.1     2.5     2.6       Hemoglobin 12.0 - 16.0 11.7     12.8     12.0      Hematocrit 36 - 46 35     37     36      Platelets 150 - 400 K/uL 84     96     72         This result is from an external source.      Latest Ref Rng & Units 11/09/2022    3:47 PM 10/23/2022   12:00 AM 10/12/2022    1:37 PM  CMP  Glucose 70 - 99 mg/dL 161   77   BUN 6 - 20 mg/dL 14  18     15    Creatinine 0.44 - 1.00 mg/dL 0.96  1.1     0.45   Sodium 135 - 145 mmol/L 140  138     141   Potassium 3.5 - 5.1 mmol/L 3.8  3.9     3.5   Chloride 98 - 111 mmol/L 107  105     107   CO2 22 - 32 mmol/L 24  23     23    Calcium 8.9 - 10.3 mg/dL 9.0  9.1     9.1   Total Protein 6.5 - 8.1 g/dL 6.1   6.7   Total Bilirubin 0.3 - 1.2 mg/dL 0.5   0.5   Alkaline Phos 38 - 126 U/L 128  101     102   AST 15 - 41 U/L 25  28     26    ALT 0 - 44 U/L 19  19     22       This result is from an external source.   ASSESSMENT & PLAN:  Assessment/Plan:  A 57 y.o. female with metastatic colon cancer.  He will proceed with her 7th cycle of FOLFOX/Avastin this week.  As she is mildly neutropenic, she will receive a few doses of Zarxio to prevent severe neutropenia from either delaying future cycles of treatment or causing a spontaneous infection.  Clinically, the patient appears to be doing very well.  I will see her back in 2 weeks before she heads into her 8th cycle of FOLFOX/Avastin.  The patient understands all the plans discussed today and is in agreement with them.  Xerxes Agrusa Kirby Funk, MD

## 2022-11-23 ENCOUNTER — Inpatient Hospital Stay: Payer: 59

## 2022-11-23 ENCOUNTER — Inpatient Hospital Stay (HOSPITAL_BASED_OUTPATIENT_CLINIC_OR_DEPARTMENT_OTHER): Payer: 59 | Admitting: Oncology

## 2022-11-23 DIAGNOSIS — C189 Malignant neoplasm of colon, unspecified: Secondary | ICD-10-CM | POA: Diagnosis not present

## 2022-11-23 DIAGNOSIS — Z5111 Encounter for antineoplastic chemotherapy: Secondary | ICD-10-CM | POA: Diagnosis not present

## 2022-11-23 DIAGNOSIS — C7971 Secondary malignant neoplasm of right adrenal gland: Secondary | ICD-10-CM | POA: Diagnosis not present

## 2022-11-23 DIAGNOSIS — C7802 Secondary malignant neoplasm of left lung: Secondary | ICD-10-CM | POA: Diagnosis not present

## 2022-11-23 DIAGNOSIS — C7800 Secondary malignant neoplasm of unspecified lung: Secondary | ICD-10-CM

## 2022-11-23 DIAGNOSIS — D709 Neutropenia, unspecified: Secondary | ICD-10-CM | POA: Diagnosis not present

## 2022-11-23 DIAGNOSIS — C182 Malignant neoplasm of ascending colon: Secondary | ICD-10-CM | POA: Diagnosis not present

## 2022-11-23 DIAGNOSIS — C787 Secondary malignant neoplasm of liver and intrahepatic bile duct: Secondary | ICD-10-CM | POA: Diagnosis not present

## 2022-11-23 DIAGNOSIS — Z5189 Encounter for other specified aftercare: Secondary | ICD-10-CM | POA: Diagnosis not present

## 2022-11-23 DIAGNOSIS — C786 Secondary malignant neoplasm of retroperitoneum and peritoneum: Secondary | ICD-10-CM | POA: Diagnosis not present

## 2022-11-23 DIAGNOSIS — C7931 Secondary malignant neoplasm of brain: Secondary | ICD-10-CM | POA: Diagnosis not present

## 2022-11-23 DIAGNOSIS — C7801 Secondary malignant neoplasm of right lung: Secondary | ICD-10-CM | POA: Diagnosis not present

## 2022-11-23 LAB — TOTAL PROTEIN, URINE DIPSTICK: Protein, ur: 30 mg/dL — AB

## 2022-11-24 ENCOUNTER — Encounter: Payer: Self-pay | Admitting: Oncology

## 2022-11-24 ENCOUNTER — Other Ambulatory Visit: Payer: Self-pay | Admitting: Psychiatry

## 2022-11-24 DIAGNOSIS — F333 Major depressive disorder, recurrent, severe with psychotic symptoms: Secondary | ICD-10-CM

## 2022-11-24 LAB — CBC W DIFFERENTIAL (~~LOC~~ CC SCANNED REPORT)

## 2022-11-24 LAB — CMP (CANCER CENTER ONLY)
ALT: 22 U/L (ref 0–44)
AST: 27 U/L (ref 15–41)
Albumin: 3.5 g/dL (ref 3.5–5.0)
Alkaline Phosphatase: 128 U/L — ABNORMAL HIGH (ref 38–126)
Anion gap: 5 (ref 5–15)
BUN: 17 mg/dL (ref 6–20)
CO2: 25 mmol/L (ref 22–32)
Calcium: 8.6 mg/dL — ABNORMAL LOW (ref 8.9–10.3)
Chloride: 109 mmol/L (ref 98–111)
Creatinine: 1.24 mg/dL — ABNORMAL HIGH (ref 0.44–1.00)
GFR, Estimated: 51 mL/min — ABNORMAL LOW (ref >60)
Glucose, Bld: 102 mg/dL — ABNORMAL HIGH (ref 70–99)
Potassium: 3.6 mmol/L (ref 3.5–5.1)
Sodium: 139 mmol/L (ref 135–145)
Total Bilirubin: 0.6 mg/dL (ref 0.3–1.2)
Total Protein: 6.1 g/dL — ABNORMAL LOW (ref 6.5–8.1)

## 2022-11-24 MED FILL — Fosaprepitant Dimeglumine For IV Infusion 150 MG (Base Eq): INTRAVENOUS | Qty: 5 | Status: AC

## 2022-11-24 MED FILL — Fluorouracil IV Soln 2.5 GM/50ML (50 MG/ML): INTRAVENOUS | Qty: 14 | Status: AC

## 2022-11-24 MED FILL — Dexamethasone Sodium Phosphate Inj 100 MG/10ML: INTRAMUSCULAR | Qty: 1 | Status: AC

## 2022-11-24 MED FILL — Fluorouracil IV Soln 5 GM/100ML (50 MG/ML): INTRAVENOUS | Qty: 87 | Status: AC

## 2022-11-25 ENCOUNTER — Encounter: Payer: Self-pay | Admitting: Oncology

## 2022-11-25 ENCOUNTER — Other Ambulatory Visit (HOSPITAL_COMMUNITY): Payer: Self-pay

## 2022-11-25 ENCOUNTER — Inpatient Hospital Stay: Payer: 59

## 2022-11-25 VITALS — BP 140/84 | HR 90 | Resp 20 | Ht 68.0 in | Wt 157.0 lb

## 2022-11-25 DIAGNOSIS — C7931 Secondary malignant neoplasm of brain: Secondary | ICD-10-CM | POA: Diagnosis not present

## 2022-11-25 DIAGNOSIS — C182 Malignant neoplasm of ascending colon: Secondary | ICD-10-CM

## 2022-11-25 DIAGNOSIS — D709 Neutropenia, unspecified: Secondary | ICD-10-CM | POA: Diagnosis not present

## 2022-11-25 DIAGNOSIS — C7802 Secondary malignant neoplasm of left lung: Secondary | ICD-10-CM | POA: Diagnosis not present

## 2022-11-25 DIAGNOSIS — Z5189 Encounter for other specified aftercare: Secondary | ICD-10-CM | POA: Diagnosis not present

## 2022-11-25 DIAGNOSIS — C189 Malignant neoplasm of colon, unspecified: Secondary | ICD-10-CM

## 2022-11-25 DIAGNOSIS — Z5111 Encounter for antineoplastic chemotherapy: Secondary | ICD-10-CM | POA: Diagnosis not present

## 2022-11-25 DIAGNOSIS — C7971 Secondary malignant neoplasm of right adrenal gland: Secondary | ICD-10-CM | POA: Diagnosis not present

## 2022-11-25 DIAGNOSIS — C7801 Secondary malignant neoplasm of right lung: Secondary | ICD-10-CM | POA: Diagnosis not present

## 2022-11-25 DIAGNOSIS — C786 Secondary malignant neoplasm of retroperitoneum and peritoneum: Secondary | ICD-10-CM | POA: Diagnosis not present

## 2022-11-25 DIAGNOSIS — C787 Secondary malignant neoplasm of liver and intrahepatic bile duct: Secondary | ICD-10-CM | POA: Diagnosis not present

## 2022-11-25 MED ORDER — TRULICITY 3 MG/0.5ML ~~LOC~~ SOAJ
SUBCUTANEOUS | 1 refills | Status: DC
  Filled 2022-11-25 – 2022-12-14 (×2): qty 2, 28d supply, fill #0

## 2022-11-25 MED ORDER — OXALIPLATIN CHEMO INJECTION 50 MG/10ML
85.0000 mg/m2 | Freq: Once | INTRAVENOUS | Status: AC
  Administered 2022-11-25: 150 mg via INTRAVENOUS
  Filled 2022-11-25: qty 20

## 2022-11-25 MED ORDER — SODIUM CHLORIDE 0.9 % IV SOLN
5.0000 mg/kg | Freq: Once | INTRAVENOUS | Status: AC
  Administered 2022-11-25: 350 mg via INTRAVENOUS
  Filled 2022-11-25: qty 14

## 2022-11-25 MED ORDER — FAMOTIDINE IN NACL 20-0.9 MG/50ML-% IV SOLN
20.0000 mg | Freq: Once | INTRAVENOUS | Status: AC
  Administered 2022-11-25: 20 mg via INTRAVENOUS
  Filled 2022-11-25: qty 50

## 2022-11-25 MED ORDER — FLUOROURACIL CHEMO INJECTION 2.5 GM/50ML
400.0000 mg/m2 | Freq: Once | INTRAVENOUS | Status: AC
  Administered 2022-11-25: 700 mg via INTRAVENOUS
  Filled 2022-11-25: qty 14

## 2022-11-25 MED ORDER — SODIUM CHLORIDE 0.9 % IV SOLN: Freq: Once | INTRAVENOUS | Status: AC

## 2022-11-25 MED ORDER — SODIUM CHLORIDE 0.9 % IV SOLN
2400.0000 mg/m2 | INTRAVENOUS | Status: DC
  Administered 2022-11-25: 4350 mg via INTRAVENOUS
  Filled 2022-11-25: qty 87

## 2022-11-25 MED ORDER — SODIUM CHLORIDE 0.9 % IV SOLN
150.0000 mg | Freq: Once | INTRAVENOUS | Status: AC
  Administered 2022-11-25: 150 mg via INTRAVENOUS
  Filled 2022-11-25: qty 150

## 2022-11-25 MED ORDER — PALONOSETRON HCL INJECTION 0.25 MG/5ML
0.2500 mg | Freq: Once | INTRAVENOUS | Status: AC
  Administered 2022-11-25: 0.25 mg via INTRAVENOUS
  Filled 2022-11-25: qty 5

## 2022-11-25 MED ORDER — DIPHENHYDRAMINE HCL 50 MG/ML IJ SOLN
25.0000 mg | Freq: Once | INTRAMUSCULAR | Status: AC
  Administered 2022-11-25: 25 mg via INTRAVENOUS
  Filled 2022-11-25: qty 1

## 2022-11-25 MED ORDER — DEXTROSE 5 % IV SOLN: Freq: Once | INTRAVENOUS | Status: AC

## 2022-11-25 MED ORDER — SODIUM CHLORIDE 0.9 % IV SOLN
10.0000 mg | Freq: Once | INTRAVENOUS | Status: AC
  Administered 2022-11-25: 10 mg via INTRAVENOUS
  Filled 2022-11-25: qty 10

## 2022-11-25 NOTE — Patient Instructions (Addendum)
Bevacizumab Injection What is this medication? BEVACIZUMAB (be va SIZ yoo mab) treats some types of cancer. It works by blocking a protein that causes cancer cells to grow and multiply. This helps to slow or stop the spread of cancer cells. It is a monoclonal antibody. This medicine may be used for other purposes; ask your health care provider or pharmacist if you have questions. COMMON BRAND NAME(S): Alymsys, Avastin, MVASI, Zirabev What should I tell my care team before I take this medication? They need to know if you have any of these conditions: Blood clots Coughing up blood Having or recent surgery Heart failure High blood pressure History of a connection between 2 or more body parts that do not usually connect (fistula) History of a tear in your stomach or intestines Protein in your urine An unusual or allergic reaction to bevacizumab, other medications, foods, dyes, or preservatives Pregnant or trying to get pregnant Breast-feeding How should I use this medication? This medication is injected into a vein. It is given by your care team in a hospital or clinic setting. Talk to your care team the use of this medication in children. Special care may be needed. Overdosage: If you think you have taken too much of this medicine contact a poison control center or emergency room at once. NOTE: This medicine is only for you. Do not share this medicine with others. What if I miss a dose? Keep appointments for follow-up doses. It is important not to miss your dose. Call your care team if you are unable to keep an appointment. What may interact with this medication? Interactions are not expected. This list may not describe all possible interactions. Give your health care provider a list of all the medicines, herbs, non-prescription drugs, or dietary supplements you use. Also tell them if you smoke, drink alcohol, or use illegal drugs. Some items may interact with your medicine. What should I  watch for while using this medication? Your condition will be monitored carefully while you are receiving this medication. You may need blood work while taking this medication. This medication may make you feel generally unwell. This is not uncommon as chemotherapy can affect healthy cells as well as cancer cells. Report any side effects. Continue your course of treatment even though you feel ill unless your care team tells you to stop. This medication may increase your risk to bruise or bleed. Call your care team if you notice any unusual bleeding. Before having surgery, talk to your care team to make sure it is ok. This medication can increase the risk of poor healing of your surgical site or wound. You will need to stop this medication for 28 days before surgery. After surgery, wait at least 28 days before restarting this medication. Make sure the surgical site or wound is healed enough before restarting this medication. Talk to your care team if questions. Talk to your care team if you may be pregnant. Serious birth defects can occur if you take this medication during pregnancy and for 6 months after the last dose. Contraception is recommended while taking this medication and for 6 months after the last dose. Your care team can help you find the option that works for you. Do not breastfeed while taking this medication and for 6 months after the last dose. This medication can cause infertility. Talk to your care team if you are concerned about your fertility. What side effects may I notice from receiving this medication? Side effects that you should report   to your care team as soon as possible: Allergic reactions--skin rash, itching, hives, swelling of the face, lips, tongue, or throat Bleeding--bloody or black, tar-like stools, vomiting blood or brown material that looks like coffee grounds, red or dark brown urine, small red or purple spots on skin, unusual bruising or bleeding Blood clot--pain,  swelling, or warmth in the leg, shortness of breath, chest pain Heart attack--pain or tightness in the chest, shoulders, arms, or jaw, nausea, shortness of breath, cold or clammy skin, feeling faint or lightheaded Heart failure--shortness of breath, swelling of the ankles, feet, or hands, sudden weight gain, unusual weakness or fatigue Increase in blood pressure Infection--fever, chills, cough, sore throat, wounds that don't heal, pain or trouble when passing urine, general feeling of discomfort or being unwell Infusion reactions--chest pain, shortness of breath or trouble breathing, feeling faint or lightheaded Kidney injury--decrease in the amount of urine, swelling of the ankles, hands, or feet Stomach pain that is severe, does not go away, or gets worse Stroke--sudden numbness or weakness of the face, arm, or leg, trouble speaking, confusion, trouble walking, loss of balance or coordination, dizziness, severe headache, change in vision Sudden and severe headache, confusion, change in vision, seizures, which may be signs of posterior reversible encephalopathy syndrome (PRES) Side effects that usually do not require medical attention (report to your care team if they continue or are bothersome): Back pain Change in taste Diarrhea Dry skin Increased tears Nosebleed This list may not describe all possible side effects. Call your doctor for medical advice about side effects. You may report side effects to FDA at 1-800-FDA-1088. Where should I keep my medication? This medication is given in a hospital or clinic. It will not be stored at home. NOTE: This sheet is a summary. It may not cover all possible information. If you have questions about this medicine, talk to your doctor, pharmacist, or health care provider.  2024 [employer]/Gold Standard (2021-10-03 00:00:00) Oxaliplatin Injection What is this medication? OXALIPLATIN (ox AL i PLA tin) treats colorectal cancer. It works by slowing down the  growth of cancer cells. This medicine may be used for other purposes; ask your health care provider or pharmacist if you have questions. COMMON BRAND NAME(S): Eloxatin What should I tell my care team before I take this medication? They need to know if you have any of these conditions: Heart disease History of irregular heartbeat or rhythm Liver disease Low blood cell levels (white cells, red cells, and platelets) Lung or breathing disease, such as asthma Take medications that treat or prevent blood clots Tingling of the fingers, toes, or other nerve disorder An unusual or allergic reaction to oxaliplatin, other medications, foods, dyes, or preservatives If you or your partner are pregnant or trying to get pregnant Breast-feeding How should I use this medication? This medication is injected into a vein. It is given by your care team in a hospital or clinic setting. Talk to your care team about the use of this medication in children. Special care may be needed. Overdosage: If you think you have taken too much of this medicine contact a poison control center or emergency room at once. NOTE: This medicine is only for you. Do not share this medicine with others. What if I miss a dose? Keep appointments for follow-up doses. It is important not to miss a dose. Call your care team if you are unable to keep an appointment. What may interact with this medication? Do not take this medication with any of the  following: Cisapride Dronedarone Pimozide Thioridazine This medication may also interact with the following: Aspirin and aspirin-like medications Certain medications that treat or prevent blood clots, such as warfarin, apixaban, dabigatran, and rivaroxaban Cisplatin Cyclosporine Diuretics Medications for infection, such as acyclovir, adefovir, amphotericin B, bacitracin, cidofovir, foscarnet, ganciclovir, gentamicin, pentamidine, vancomycin NSAIDs, medications for pain and inflammation,  such as ibuprofen or naproxen Other medications that cause heart rhythm changes Pamidronate Zoledronic acid This list may not describe all possible interactions. Give your health care provider a list of all the medicines, herbs, non-prescription drugs, or dietary supplements you use. Also tell them if you smoke, drink alcohol, or use illegal drugs. Some items may interact with your medicine. What should I watch for while using this medication? Your condition will be monitored carefully while you are receiving this medication. You may need blood work while taking this medication. This medication may make you feel generally unwell. This is not uncommon as chemotherapy can affect healthy cells as well as cancer cells. Report any side effects. Continue your course of treatment even though you feel ill unless your care team tells you to stop. This medication may increase your risk of getting an infection. Call your care team for advice if you get a fever, chills, sore throat, or other symptoms of a cold or flu. Do not treat yourself. Try to avoid being around people who are sick. Avoid taking medications that contain aspirin, acetaminophen, ibuprofen, naproxen, or ketoprofen unless instructed by your care team. These medications may hide a fever. Be careful brushing or flossing your teeth or using a toothpick because you may get an infection or bleed more easily. If you have any dental work done, tell your dentist you are receiving this medication. This medication can make you more sensitive to cold. Do not drink cold drinks or use ice. Cover exposed skin before coming in contact with cold temperatures or cold objects. When out in cold weather wear warm clothing and cover your mouth and nose to warm the air that goes into your lungs. Tell your care team if you get sensitive to the cold. Talk to your care team if you or your partner are pregnant or think either of you might be pregnant. This medication can  cause serious birth defects if taken during pregnancy and for 9 months after the last dose. A negative pregnancy test is required before starting this medication. A reliable form of contraception is recommended while taking this medication and for 9 months after the last dose. Talk to your care team about effective forms of contraception. Do not father a child while taking this medication and for 6 months after the last dose. Use a condom while having sex during this time period. Do not breastfeed while taking this medication and for 3 months after the last dose. This medication may cause infertility. Talk to your care team if you are concerned about your fertility. What side effects may I notice from receiving this medication? Side effects that you should report to your care team as soon as possible: Allergic reactions--skin rash, itching, hives, swelling of the face, lips, tongue, or throat Bleeding--bloody or black, tar-like stools, vomiting blood or brown material that looks like coffee grounds, red or dark brown urine, small red or purple spots on skin, unusual bruising or bleeding Dry cough, shortness of breath or trouble breathing Heart rhythm changes--fast or irregular heartbeat, dizziness, feeling faint or lightheaded, chest pain, trouble breathing Infection--fever, chills, cough, sore throat, wounds that don't heal,  pain or trouble when passing urine, general feeling of discomfort or being unwell Liver injury--right upper belly pain, loss of appetite, nausea, light-colored stool, dark yellow or brown urine, yellowing skin or eyes, unusual weakness or fatigue Low red blood cell level--unusual weakness or fatigue, dizziness, headache, trouble breathing Muscle injury--unusual weakness or fatigue, muscle pain, dark yellow or brown urine, decrease in amount of urine Pain, tingling, or numbness in the hands or feet Sudden and severe headache, confusion, change in vision, seizures, which may be  signs of posterior reversible encephalopathy syndrome (PRES) Unusual bruising or bleeding Side effects that usually do not require medical attention (report to your care team if they continue or are bothersome): Diarrhea Nausea Pain, redness, or swelling with sores inside the mouth or throat Unusual weakness or fatigue Vomiting This list may not describe all possible side effects. Call your doctor for medical advice about side effects. You may report side effects to FDA at 1-800-FDA-1088. Where should I keep my medication? This medication is given in a hospital or clinic. It will not be stored at home. NOTE: This sheet is a summary. It may not cover all possible information. If you have questions about this medicine, talk to your doctor, pharmacist, or health care provider.  2024 [employer]/Gold Standard (2022-07-26 00:00:00) Fluorouracil Injection What is this medication? FLUOROURACIL (flure oh YOOR a sil) treats some types of cancer. It works by slowing down the growth of cancer cells. This medicine may be used for other purposes; ask your health care provider or pharmacist if you have questions. COMMON BRAND NAME(S): Adrucil What should I tell my care team before I take this medication? They need to know if you have any of these conditions: Blood disorders Dihydropyrimidine dehydrogenase (DPD) deficiency Infection, such as chickenpox, cold sores, herpes Kidney disease Liver disease Poor nutrition Recent or ongoing radiation therapy An unusual or allergic reaction to fluorouracil, other medications, foods, dyes, or preservatives If you or your partner are pregnant or trying to get pregnant Breast-feeding How should I use this medication? This medication is injected into a vein. It is administered by your care team in a hospital or clinic setting. Talk to your care team about the use of this medication in children. Special care may be needed. Overdosage: If you think you have taken  too much of this medicine contact a poison control center or emergency room at once. NOTE: This medicine is only for you. Do not share this medicine with others. What if I miss a dose? Keep appointments for follow-up doses. It is important not to miss your dose. Call your care team if you are unable to keep an appointment. What may interact with this medication? Do not take this medication with any of the following: Live virus vaccines This medication may also interact with the following: Medications that treat or prevent blood clots, such as warfarin, enoxaparin, dalteparin This list may not describe all possible interactions. Give your health care provider a list of all the medicines, herbs, non-prescription drugs, or dietary supplements you use. Also tell them if you smoke, drink alcohol, or use illegal drugs. Some items may interact with your medicine. What should I watch for while using this medication? Your condition will be monitored carefully while you are receiving this medication. This medication may make you feel generally unwell. This is not uncommon as chemotherapy can affect healthy cells as well as cancer cells. Report any side effects. Continue your course of treatment even though you feel ill unless  your care team tells you to stop. In some cases, you may be given additional medications to help with side effects. Follow all directions for their use. This medication may increase your risk of getting an infection. Call your care team for advice if you get a fever, chills, sore throat, or other symptoms of a cold or flu. Do not treat yourself. Try to avoid being around people who are sick. This medication may increase your risk to bruise or bleed. Call your care team if you notice any unusual bleeding. Be careful brushing or flossing your teeth or using a toothpick because you may get an infection or bleed more easily. If you have any dental work done, tell your dentist you are  receiving this medication. Avoid taking medications that contain aspirin, acetaminophen, ibuprofen, naproxen, or ketoprofen unless instructed by your care team. These medications may hide a fever. Do not treat diarrhea with over the counter products. Contact your care team if you have diarrhea that lasts more than 2 days or if it is severe and watery. This medication can make you more sensitive to the sun. Keep out of the sun. If you cannot avoid being in the sun, wear protective clothing and sunscreen. Do not use sun lamps, tanning beds, or tanning booths. Talk to your care team if you or your partner wish to become pregnant or think you might be pregnant. This medication can cause serious birth defects if taken during pregnancy and for 3 months after the last dose. A reliable form of contraception is recommended while taking this medication and for 3 months after the last dose. Talk to your care team about effective forms of contraception. Do not father a child while taking this medication and for 3 months after the last dose. Use a condom while having sex during this time period. Do not breastfeed while taking this medication. This medication may cause infertility. Talk to your care team if you are concerned about your fertility. What side effects may I notice from receiving this medication? Side effects that you should report to your care team as soon as possible: Allergic reactions--skin rash, itching, hives, swelling of the face, lips, tongue, or throat Heart attack--pain or tightness in the chest, shoulders, arms, or jaw, nausea, shortness of breath, cold or clammy skin, feeling faint or lightheaded Heart failure--shortness of breath, swelling of the ankles, feet, or hands, sudden weight gain, unusual weakness or fatigue Heart rhythm changes--fast or irregular heartbeat, dizziness, feeling faint or lightheaded, chest pain, trouble breathing High ammonia level--unusual weakness or fatigue,  confusion, loss of appetite, nausea, vomiting, seizures Infection--fever, chills, cough, sore throat, wounds that don't heal, pain or trouble when passing urine, general feeling of discomfort or being unwell Low red blood cell level--unusual weakness or fatigue, dizziness, headache, trouble breathing Pain, tingling, or numbness in the hands or feet, muscle weakness, change in vision, confusion or trouble speaking, loss of balance or coordination, trouble walking, seizures Redness, swelling, and blistering of the skin over hands and feet Severe or prolonged diarrhea Unusual bruising or bleeding Side effects that usually do not require medical attention (report to your care team if they continue or are bothersome): Dry skin Headache Increased tears Nausea Pain, redness, or swelling with sores inside the mouth or throat Sensitivity to light Vomiting This list may not describe all possible side effects. Call your doctor for medical advice about side effects. You may report side effects to FDA at 1-800-FDA-1088. Where should I keep my medication? This  medication is given in a hospital or clinic. It will not be stored at home. NOTE: This sheet is a summary. It may not cover all possible information. If you have questions about this medicine, talk to your doctor, pharmacist, or health care provider.  2024 [employer]/Gold Standard (2021-09-23 00:00:00)

## 2022-11-27 ENCOUNTER — Inpatient Hospital Stay: Payer: 59

## 2022-11-27 VITALS — BP 133/72 | HR 89 | Temp 98.1°F | Resp 18 | Ht 68.0 in | Wt 158.0 lb

## 2022-11-27 DIAGNOSIS — C7971 Secondary malignant neoplasm of right adrenal gland: Secondary | ICD-10-CM | POA: Diagnosis not present

## 2022-11-27 DIAGNOSIS — Z5111 Encounter for antineoplastic chemotherapy: Secondary | ICD-10-CM | POA: Diagnosis not present

## 2022-11-27 DIAGNOSIS — C786 Secondary malignant neoplasm of retroperitoneum and peritoneum: Secondary | ICD-10-CM | POA: Diagnosis not present

## 2022-11-27 DIAGNOSIS — Z5189 Encounter for other specified aftercare: Secondary | ICD-10-CM | POA: Diagnosis not present

## 2022-11-27 DIAGNOSIS — C7801 Secondary malignant neoplasm of right lung: Secondary | ICD-10-CM | POA: Diagnosis not present

## 2022-11-27 DIAGNOSIS — C189 Malignant neoplasm of colon, unspecified: Secondary | ICD-10-CM | POA: Diagnosis not present

## 2022-11-27 DIAGNOSIS — C182 Malignant neoplasm of ascending colon: Secondary | ICD-10-CM

## 2022-11-27 DIAGNOSIS — C7802 Secondary malignant neoplasm of left lung: Secondary | ICD-10-CM | POA: Diagnosis not present

## 2022-11-27 DIAGNOSIS — C7931 Secondary malignant neoplasm of brain: Secondary | ICD-10-CM | POA: Diagnosis not present

## 2022-11-27 DIAGNOSIS — C787 Secondary malignant neoplasm of liver and intrahepatic bile duct: Secondary | ICD-10-CM | POA: Diagnosis not present

## 2022-11-27 DIAGNOSIS — D709 Neutropenia, unspecified: Secondary | ICD-10-CM | POA: Diagnosis not present

## 2022-11-27 MED ORDER — SODIUM CHLORIDE 0.9% FLUSH
10.0000 mL | INTRAVENOUS | Status: DC | PRN
  Administered 2022-11-27: 10 mL

## 2022-11-27 MED ORDER — HEPARIN SOD (PORK) LOCK FLUSH 100 UNIT/ML IV SOLN
500.0000 [IU] | Freq: Once | INTRAVENOUS | Status: AC | PRN
  Administered 2022-11-27: 500 [IU]

## 2022-11-27 NOTE — Patient Instructions (Signed)
Fluorouracil Injection What is this medication? FLUOROURACIL (flure oh YOOR a sil) treats some types of cancer. It works by slowing down the growth of cancer cells. This medicine may be used for other purposes; ask your health care provider or pharmacist if you have questions. COMMON BRAND NAME(S): Adrucil What should I tell my care team before I take this medication? They need to know if you have any of these conditions: Blood disorders Dihydropyrimidine dehydrogenase (DPD) deficiency Infection, such as chickenpox, cold sores, herpes Kidney disease Liver disease Poor nutrition Recent or ongoing radiation therapy An unusual or allergic reaction to fluorouracil, other medications, foods, dyes, or preservatives If you or your partner are pregnant or trying to get pregnant Breast-feeding How should I use this medication? This medication is injected into a vein. It is administered by your care team in a hospital or clinic setting. Talk to your care team about the use of this medication in children. Special care may be needed. Overdosage: If you think you have taken too much of this medicine contact a poison control center or emergency room at once. NOTE: This medicine is only for you. Do not share this medicine with others. What if I miss a dose? Keep appointments for follow-up doses. It is important not to miss your dose. Call your care team if you are unable to keep an appointment. What may interact with this medication? Do not take this medication with any of the following: Live virus vaccines This medication may also interact with the following: Medications that treat or prevent blood clots, such as warfarin, enoxaparin, dalteparin This list may not describe all possible interactions. Give your health care provider a list of all the medicines, herbs, non-prescription drugs, or dietary supplements you use. Also tell them if you smoke, drink alcohol, or use illegal drugs. Some items may  interact with your medicine. What should I watch for while using this medication? Your condition will be monitored carefully while you are receiving this medication. This medication may make you feel generally unwell. This is not uncommon as chemotherapy can affect healthy cells as well as cancer cells. Report any side effects. Continue your course of treatment even though you feel ill unless your care team tells you to stop. In some cases, you may be given additional medications to help with side effects. Follow all directions for their use. This medication may increase your risk of getting an infection. Call your care team for advice if you get a fever, chills, sore throat, or other symptoms of a cold or flu. Do not treat yourself. Try to avoid being around people who are sick. This medication may increase your risk to bruise or bleed. Call your care team if you notice any unusual bleeding. Be careful brushing or flossing your teeth or using a toothpick because you may get an infection or bleed more easily. If you have any dental work done, tell your dentist you are receiving this medication. Avoid taking medications that contain aspirin, acetaminophen, ibuprofen, naproxen, or ketoprofen unless instructed by your care team. These medications may hide a fever. Do not treat diarrhea with over the counter products. Contact your care team if you have diarrhea that lasts more than 2 days or if it is severe and watery. This medication can make you more sensitive to the sun. Keep out of the sun. If you cannot avoid being in the sun, wear protective clothing and sunscreen. Do not use sun lamps, tanning beds, or tanning booths. Talk to   your care team if you or your partner wish to become pregnant or think you might be pregnant. This medication can cause serious birth defects if taken during pregnancy and for 3 months after the last dose. A reliable form of contraception is recommended while taking this  medication and for 3 months after the last dose. Talk to your care team about effective forms of contraception. Do not father a child while taking this medication and for 3 months after the last dose. Use a condom while having sex during this time period. Do not breastfeed while taking this medication. This medication may cause infertility. Talk to your care team if you are concerned about your fertility. What side effects may I notice from receiving this medication? Side effects that you should report to your care team as soon as possible: Allergic reactions--skin rash, itching, hives, swelling of the face, lips, tongue, or throat Heart attack--pain or tightness in the chest, shoulders, arms, or jaw, nausea, shortness of breath, cold or clammy skin, feeling faint or lightheaded Heart failure--shortness of breath, swelling of the ankles, feet, or hands, sudden weight gain, unusual weakness or fatigue Heart rhythm changes--fast or irregular heartbeat, dizziness, feeling faint or lightheaded, chest pain, trouble breathing High ammonia level--unusual weakness or fatigue, confusion, loss of appetite, nausea, vomiting, seizures Infection--fever, chills, cough, sore throat, wounds that don't heal, pain or trouble when passing urine, general feeling of discomfort or being unwell Low red blood cell level--unusual weakness or fatigue, dizziness, headache, trouble breathing Pain, tingling, or numbness in the hands or feet, muscle weakness, change in vision, confusion or trouble speaking, loss of balance or coordination, trouble walking, seizures Redness, swelling, and blistering of the skin over hands and feet Severe or prolonged diarrhea Unusual bruising or bleeding Side effects that usually do not require medical attention (report to your care team if they continue or are bothersome): Dry skin Headache Increased tears Nausea Pain, redness, or swelling with sores inside the mouth or throat Sensitivity  to light Vomiting This list may not describe all possible side effects. Call your doctor for medical advice about side effects. You may report side effects to FDA at 1-800-FDA-1088. Where should I keep my medication? This medication is given in a hospital or clinic. It will not be stored at home. NOTE: This sheet is a summary. It may not cover all possible information. If you have questions about this medicine, talk to your doctor, pharmacist, or health care provider.  2024 [employer]vier/Gold Standard (2021-09-23 00:00:00)  

## 2022-11-30 ENCOUNTER — Inpatient Hospital Stay: Payer: 59 | Attending: Oncology

## 2022-11-30 VITALS — BP 113/76 | HR 100 | Temp 97.7°F | Resp 18 | Wt 157.1 lb

## 2022-11-30 DIAGNOSIS — C7931 Secondary malignant neoplasm of brain: Secondary | ICD-10-CM | POA: Insufficient documentation

## 2022-11-30 DIAGNOSIS — Z5111 Encounter for antineoplastic chemotherapy: Secondary | ICD-10-CM | POA: Diagnosis not present

## 2022-11-30 DIAGNOSIS — C7802 Secondary malignant neoplasm of left lung: Secondary | ICD-10-CM | POA: Diagnosis not present

## 2022-11-30 DIAGNOSIS — C189 Malignant neoplasm of colon, unspecified: Secondary | ICD-10-CM | POA: Insufficient documentation

## 2022-11-30 DIAGNOSIS — C786 Secondary malignant neoplasm of retroperitoneum and peritoneum: Secondary | ICD-10-CM | POA: Diagnosis not present

## 2022-11-30 DIAGNOSIS — Z5189 Encounter for other specified aftercare: Secondary | ICD-10-CM | POA: Diagnosis not present

## 2022-11-30 DIAGNOSIS — C787 Secondary malignant neoplasm of liver and intrahepatic bile duct: Secondary | ICD-10-CM | POA: Diagnosis not present

## 2022-11-30 DIAGNOSIS — C182 Malignant neoplasm of ascending colon: Secondary | ICD-10-CM

## 2022-11-30 DIAGNOSIS — C7801 Secondary malignant neoplasm of right lung: Secondary | ICD-10-CM | POA: Insufficient documentation

## 2022-11-30 DIAGNOSIS — C7971 Secondary malignant neoplasm of right adrenal gland: Secondary | ICD-10-CM | POA: Diagnosis not present

## 2022-11-30 MED ORDER — FILGRASTIM-SNDZ 480 MCG/0.8ML IJ SOSY
480.0000 ug | PREFILLED_SYRINGE | Freq: Once | INTRAMUSCULAR | Status: AC
  Administered 2022-11-30: 480 ug via SUBCUTANEOUS
  Filled 2022-11-30: qty 0.8

## 2022-12-01 ENCOUNTER — Other Ambulatory Visit (HOSPITAL_COMMUNITY): Payer: Self-pay

## 2022-12-01 ENCOUNTER — Inpatient Hospital Stay: Payer: 59

## 2022-12-01 DIAGNOSIS — C7801 Secondary malignant neoplasm of right lung: Secondary | ICD-10-CM | POA: Diagnosis not present

## 2022-12-01 DIAGNOSIS — C7931 Secondary malignant neoplasm of brain: Secondary | ICD-10-CM | POA: Diagnosis not present

## 2022-12-01 DIAGNOSIS — Z5189 Encounter for other specified aftercare: Secondary | ICD-10-CM | POA: Diagnosis not present

## 2022-12-01 DIAGNOSIS — C786 Secondary malignant neoplasm of retroperitoneum and peritoneum: Secondary | ICD-10-CM | POA: Diagnosis not present

## 2022-12-01 DIAGNOSIS — C182 Malignant neoplasm of ascending colon: Secondary | ICD-10-CM

## 2022-12-01 DIAGNOSIS — C787 Secondary malignant neoplasm of liver and intrahepatic bile duct: Secondary | ICD-10-CM | POA: Diagnosis not present

## 2022-12-01 DIAGNOSIS — C189 Malignant neoplasm of colon, unspecified: Secondary | ICD-10-CM | POA: Diagnosis not present

## 2022-12-01 DIAGNOSIS — Z5111 Encounter for antineoplastic chemotherapy: Secondary | ICD-10-CM | POA: Diagnosis not present

## 2022-12-01 DIAGNOSIS — C7971 Secondary malignant neoplasm of right adrenal gland: Secondary | ICD-10-CM | POA: Diagnosis not present

## 2022-12-01 DIAGNOSIS — C7802 Secondary malignant neoplasm of left lung: Secondary | ICD-10-CM | POA: Diagnosis not present

## 2022-12-01 MED ORDER — FILGRASTIM-SNDZ 480 MCG/0.8ML IJ SOSY
480.0000 ug | PREFILLED_SYRINGE | Freq: Once | INTRAMUSCULAR | Status: AC
  Administered 2022-12-01: 480 ug via SUBCUTANEOUS
  Filled 2022-12-01: qty 0.8

## 2022-12-02 ENCOUNTER — Inpatient Hospital Stay: Payer: 59

## 2022-12-02 DIAGNOSIS — C182 Malignant neoplasm of ascending colon: Secondary | ICD-10-CM

## 2022-12-02 DIAGNOSIS — C189 Malignant neoplasm of colon, unspecified: Secondary | ICD-10-CM

## 2022-12-02 DIAGNOSIS — C7801 Secondary malignant neoplasm of right lung: Secondary | ICD-10-CM | POA: Diagnosis not present

## 2022-12-02 DIAGNOSIS — C787 Secondary malignant neoplasm of liver and intrahepatic bile duct: Secondary | ICD-10-CM | POA: Diagnosis not present

## 2022-12-02 DIAGNOSIS — Z5189 Encounter for other specified aftercare: Secondary | ICD-10-CM | POA: Diagnosis not present

## 2022-12-02 DIAGNOSIS — C786 Secondary malignant neoplasm of retroperitoneum and peritoneum: Secondary | ICD-10-CM | POA: Diagnosis not present

## 2022-12-02 DIAGNOSIS — C7931 Secondary malignant neoplasm of brain: Secondary | ICD-10-CM | POA: Diagnosis not present

## 2022-12-02 DIAGNOSIS — Z5111 Encounter for antineoplastic chemotherapy: Secondary | ICD-10-CM | POA: Diagnosis not present

## 2022-12-02 DIAGNOSIS — C7802 Secondary malignant neoplasm of left lung: Secondary | ICD-10-CM | POA: Diagnosis not present

## 2022-12-02 DIAGNOSIS — C7971 Secondary malignant neoplasm of right adrenal gland: Secondary | ICD-10-CM | POA: Diagnosis not present

## 2022-12-02 MED ORDER — FILGRASTIM-SNDZ 480 MCG/0.8ML IJ SOSY
480.0000 ug | PREFILLED_SYRINGE | Freq: Once | INTRAMUSCULAR | Status: AC
  Administered 2022-12-02: 480 ug via SUBCUTANEOUS
  Filled 2022-12-02: qty 0.8

## 2022-12-02 NOTE — Patient Instructions (Signed)
Filgrastim Injection What is this medication? FILGRASTIM (fil GRA stim) lowers the risk of infection in people who are receiving chemotherapy. It works by helping your body make more white blood cells, which protects your body from infection. It may also be used to help people who have been exposed to high doses of radiation. It can be used to help prepare your body before a stem cell transplant. It works by helping your bone marrow make and release stem cells into the blood. This medicine may be used for other purposes; ask your health care provider or pharmacist if you have questions. COMMON BRAND NAME(S): Neupogen, Nivestym, Releuko, Zarxio What should I tell my care team before I take this medication? They need to know if you have any of these conditions: History of blood diseases, such as sickle cell anemia Kidney disease Recent or ongoing radiation An unusual or allergic reaction to filgrastim, pegfilgrastim, latex, rubber, other medications, foods, dyes, or preservatives Pregnant or trying to get pregnant Breast-feeding How should I use this medication? This medication is injected under the skin or into a vein. It is usually given by your care team in a hospital or clinic setting. It may be given at home. If you get this medication at home, you will be taught how to prepare and give it. Use exactly as directed. Take it as directed on the prescription label at the same time every day. Keep taking it unless your care team tells you to stop. It is important that you put your used needles and syringes in a special sharps container. Do not put them in a trash can. If you do not have a sharps container, call your pharmacist or care team to get one. This medication comes with INSTRUCTIONS FOR USE. Ask your pharmacist for directions on how to use this medication. Read the information carefully. Talk to your pharmacist or care team if you have questions. Talk to your care team about the use of this  medication in children. While it may be prescribed for children for selected conditions, precautions do apply. Overdosage: If you think you have taken too much of this medicine contact a poison control center or emergency room at once. NOTE: This medicine is only for you. Do not share this medicine with others. What if I miss a dose? It is important not to miss any doses. Talk to your care team about what to do if you miss a dose. What may interact with this medication? Medications that may cause a release of neutrophils, such as lithium This list may not describe all possible interactions. Give your health care provider a list of all the medicines, herbs, non-prescription drugs, or dietary supplements you use. Also tell them if you smoke, drink alcohol, or use illegal drugs. Some items may interact with your medicine. What should I watch for while using this medication? Your condition will be monitored carefully while you are receiving this medication. You may need bloodwork while taking this medication. Talk to your care team about your risk of cancer. You may be more at risk for certain types of cancer if you take this medication. What side effects may I notice from receiving this medication? Side effects that you should report to your care team as soon as possible: Allergic reactions--skin rash, itching, hives, swelling of the face, lips, tongue, or throat Capillary leak syndrome--stomach or muscle pain, unusual weakness or fatigue, feeling faint or lightheaded, decrease in the amount of urine, swelling of the ankles, hands, or   feet, trouble breathing High white blood cell level--fever, fatigue, trouble breathing, night sweats, change in vision, weight loss Inflammation of the aorta--fever, fatigue, back, chest, or stomach pain, severe headache Kidney injury (glomerulonephritis)--decrease in the amount of urine, red or dark brown urine, foamy or bubbly urine, swelling of the ankles, hands, or  feet Shortness of breath or trouble breathing Spleen injury--pain in upper left stomach or shoulder Unusual bruising or bleeding Side effects that usually do not require medical attention (report to your care team if they continue or are bothersome): Back pain Bone pain Fatigue Fever Headache Nausea This list may not describe all possible side effects. Call your doctor for medical advice about side effects. You may report side effects to FDA at 1-800-FDA-1088. Where should I keep my medication? Keep out of the reach of children and pets. Keep this medication in the original packaging until you are ready to take it. Protect from light. See product for storage information. Each product may have different instructions. Get rid of any unused medication after the expiration date. To get rid of medications that are no longer needed or have expired: Take the medication to a medications take-back program. Check with your pharmacy or law enforcement to find a location. If you cannot return the medication, ask your pharmacist or care team how to get rid of this medication safely. NOTE: This sheet is a summary. It may not cover all possible information. If you have questions about this medicine, talk to your doctor, pharmacist, or health care provider.  2024 Elsevier/Gold Standard (2021-10-09 00:00:00)  

## 2022-12-06 NOTE — Progress Notes (Signed)
[facility]  [address] [place],  [place]  [address] [phone]  Clinic Day:  12/07/2022  Referring physician: Alinda Deem, MD  HISTORY OF PRESENT ILLNESS:  The patient is a 57 y.o. female with  metastatic colon cancer, which includes spread of disease to her abdominal cavity, lungs, brain and right adrenal gland.  She comes in today to be evaluated before heading into her 8th cycle of FOLFOX/Avastin.  The patient claims to have tolerated her 7th cycle of this chemotherapy regimen fairly well.  She did have cold insensitivity with her lips and fingers.  However, her finger/hand functions have not been hampered to where she feels a dose reduction with her oxaliplatin is necessary.  She denies having any new GI symptoms or findings which concern her for overt signs of disease progression.   With respect to her colon cancer history, she is status post a right hemicolectomy in early October 2018, followed by 12 cycles of adjuvant FOLFOX chemotherapy, which were completed in April 2019.  In December 2021, she underwent a left cerebellar metastasectomy, whose pathology was consistent with metastatic colon cancer.  Tumor testing did come back MMR normal. CT scans revealed evidence of her cancer being in multiple locations, for which she took 40 cycles of FOLFIRI/Avastin.  As follow-up scans revealed liver metastasis, she was placed back on FOLFOX/Avastin.     PHYSICAL EXAM:  Blood pressure 118/81, pulse 99, temperature 98.4 F (36.9 C), resp. rate 16, height 5\' 8"  (1.727 m), weight 156 lb 4.8 oz (70.9 kg), last menstrual period 02/22/2019, SpO2 98 %. Body mass index is 23.77 kg/m.  Performance status (ECOG): 1 - Symptomatic but completely ambulatory  Physical Exam Vitals and nursing note reviewed.  Constitutional:      General: She is not in acute distress.    Appearance: Normal appearance.  HENT:     Head: Normocephalic and atraumatic.      Mouth/Throat:     Mouth: Mucous membranes are moist.     Pharynx: Oropharynx is clear. No oropharyngeal exudate or posterior oropharyngeal erythema.  Eyes:     General: No scleral icterus.    Extraocular Movements: Extraocular movements intact.     Conjunctiva/sclera: Conjunctivae normal.     Pupils: Pupils are equal, round, and reactive to light.  Cardiovascular:     Rate and Rhythm: Normal rate and regular rhythm.     Heart sounds: Normal heart sounds. No murmur heard.    No friction rub. No gallop.  Pulmonary:     Effort: Pulmonary effort is normal.     Breath sounds: Normal breath sounds. No wheezing, rhonchi or rales.  Abdominal:     General: There is no distension.     Palpations: Abdomen is soft. There is no hepatomegaly, splenomegaly or mass.     Tenderness: There is no abdominal tenderness.  Musculoskeletal:        General: Normal range of motion.     Cervical back: Normal range of motion and neck supple. No tenderness.     Right lower leg: No edema.     Left lower leg: No edema.  Lymphadenopathy:     Cervical: No cervical adenopathy.     Upper Body:     Right upper body: No supraclavicular or axillary adenopathy.     Left upper body: No supraclavicular or axillary adenopathy.     Lower Body: No right inguinal adenopathy. No left inguinal adenopathy.  Skin:    General:  Skin is warm and dry.     Coloration: Skin is not jaundiced.     Findings: No rash.  Neurological:     Mental Status: She is alert and oriented to person, place, and time.     Cranial Nerves: No cranial nerve deficit.  Psychiatric:        Mood and Affect: Mood normal.        Behavior: Behavior normal.        Thought Content: Thought content normal.   LABS:    CBC PENDING     Latest Ref Rng & Units 11/09/2022   12:00 AM 10/27/2022   12:00 AM 10/23/2022   12:00 AM  CBC  WBC  4.1     2.5     2.6      Hemoglobin 12.0 - 16.0 11.7     12.8     12.0      Hematocrit 36 - 46 35     37     36       Platelets 150 - 400 K/uL 84     96     72         This result is from an external source.      Latest Ref Rng & Units 12/07/2022    4:02 PM 11/23/2022    3:49 PM 11/09/2022    3:47 PM  CMP  Glucose 70 - 99 mg/dL 161  096  045   BUN 6 - 20 mg/dL 16  17  14    Creatinine 0.44 - 1.00 mg/dL 4.09  8.11  9.14   Sodium 135 - 145 mmol/L 141  139  140   Potassium 3.5 - 5.1 mmol/L 3.9  3.6  3.8   Chloride 98 - 111 mmol/L 109  109  107   CO2 22 - 32 mmol/L 24  25  24    Calcium 8.9 - 10.3 mg/dL 8.9  8.6  9.0   Total Protein 6.5 - 8.1 g/dL 6.4  6.1  6.1   Total Bilirubin 0.3 - 1.2 mg/dL 0.6  0.6  0.5   Alkaline Phos 38 - 126 U/L 140  128  128   AST 15 - 41 U/L 22  27  25    ALT 0 - 44 U/L 23  22  19     ASSESSMENT & PLAN:  Assessment/Plan:  A 57 y.o. female with metastatic colon cancer.  He will proceed with her 8th cycle of FOLFOX/Avastin this week.  As she is mildly neutropenic, she will receive a few doses of Zarxio to prevent severe neutropenia from either delaying future cycles of treatment or causing a spontaneous infection.  Clinically, the patient appears to be doing very well.  I will see her back in 2 weeks before she heads into a potential 9th cycle of FOLFOX/Avastin.  CT scans of her chest/abdomen/pelvis will be done today before next visit to ascertain her new disease baseline after 8 cycles of FOLFOX/Avastin.  The patient understands all the plans discussed today and is in agreement with them.  Chanice Brenton Kirby Funk, MD

## 2022-12-07 ENCOUNTER — Inpatient Hospital Stay (HOSPITAL_BASED_OUTPATIENT_CLINIC_OR_DEPARTMENT_OTHER): Payer: 59 | Admitting: Oncology

## 2022-12-07 ENCOUNTER — Inpatient Hospital Stay: Payer: 59

## 2022-12-07 ENCOUNTER — Other Ambulatory Visit (HOSPITAL_COMMUNITY): Payer: Self-pay

## 2022-12-07 ENCOUNTER — Other Ambulatory Visit: Payer: Self-pay | Admitting: Oncology

## 2022-12-07 DIAGNOSIS — C182 Malignant neoplasm of ascending colon: Secondary | ICD-10-CM | POA: Diagnosis not present

## 2022-12-07 DIAGNOSIS — C787 Secondary malignant neoplasm of liver and intrahepatic bile duct: Secondary | ICD-10-CM

## 2022-12-07 DIAGNOSIS — C7801 Secondary malignant neoplasm of right lung: Secondary | ICD-10-CM | POA: Diagnosis not present

## 2022-12-07 DIAGNOSIS — R197 Diarrhea, unspecified: Secondary | ICD-10-CM

## 2022-12-07 DIAGNOSIS — C7800 Secondary malignant neoplasm of unspecified lung: Secondary | ICD-10-CM

## 2022-12-07 DIAGNOSIS — Z5111 Encounter for antineoplastic chemotherapy: Secondary | ICD-10-CM | POA: Diagnosis not present

## 2022-12-07 DIAGNOSIS — C7931 Secondary malignant neoplasm of brain: Secondary | ICD-10-CM | POA: Diagnosis not present

## 2022-12-07 DIAGNOSIS — Z5189 Encounter for other specified aftercare: Secondary | ICD-10-CM | POA: Diagnosis not present

## 2022-12-07 DIAGNOSIS — D696 Thrombocytopenia, unspecified: Secondary | ICD-10-CM

## 2022-12-07 DIAGNOSIS — C189 Malignant neoplasm of colon, unspecified: Secondary | ICD-10-CM

## 2022-12-07 DIAGNOSIS — C786 Secondary malignant neoplasm of retroperitoneum and peritoneum: Secondary | ICD-10-CM | POA: Diagnosis not present

## 2022-12-07 DIAGNOSIS — C7802 Secondary malignant neoplasm of left lung: Secondary | ICD-10-CM | POA: Diagnosis not present

## 2022-12-07 DIAGNOSIS — C7971 Secondary malignant neoplasm of right adrenal gland: Secondary | ICD-10-CM | POA: Diagnosis not present

## 2022-12-07 LAB — CBC WITH DIFFERENTIAL (CANCER CENTER ONLY)
Abs Immature Granulocytes: 0.02 10*3/uL (ref 0.00–0.07)
Basophils Absolute: 0 10*3/uL (ref 0.0–0.1)
Basophils Relative: 1 %
Eosinophils Absolute: 0 10*3/uL (ref 0.0–0.5)
Eosinophils Relative: 0 %
HCT: 36.9 % (ref 36.0–46.0)
Hemoglobin: 11.9 g/dL — ABNORMAL LOW (ref 12.0–15.0)
Immature Granulocytes: 1 %
Lymphocytes Relative: 30 %
Lymphs Abs: 1 10*3/uL (ref 0.7–4.0)
MCH: 32.6 pg (ref 26.0–34.0)
MCHC: 32.2 g/dL (ref 30.0–36.0)
MCV: 101.1 fL — ABNORMAL HIGH (ref 80.0–100.0)
Monocytes Absolute: 0.7 10*3/uL (ref 0.1–1.0)
Monocytes Relative: 19 %
Neutro Abs: 1.8 10*3/uL (ref 1.7–7.7)
Neutrophils Relative %: 49 %
Platelet Count: 72 10*3/uL — ABNORMAL LOW (ref 150–400)
RBC: 3.65 MIL/uL — ABNORMAL LOW (ref 3.87–5.11)
RDW: 18.6 % — ABNORMAL HIGH (ref 11.5–15.5)
WBC Count: 3.5 10*3/uL — ABNORMAL LOW (ref 4.0–10.5)
nRBC: 0.6 % — ABNORMAL HIGH (ref 0.0–0.2)

## 2022-12-07 LAB — CMP (CANCER CENTER ONLY)
ALT: 23 U/L (ref 0–44)
AST: 22 U/L (ref 15–41)
Albumin: 3.6 g/dL (ref 3.5–5.0)
Alkaline Phosphatase: 140 U/L — ABNORMAL HIGH (ref 38–126)
Anion gap: 8 (ref 5–15)
BUN: 16 mg/dL (ref 6–20)
CO2: 24 mmol/L (ref 22–32)
Calcium: 8.9 mg/dL (ref 8.9–10.3)
Chloride: 109 mmol/L (ref 98–111)
Creatinine: 1.43 mg/dL — ABNORMAL HIGH (ref 0.44–1.00)
GFR, Estimated: 43 mL/min — ABNORMAL LOW (ref >60)
Glucose, Bld: 162 mg/dL — ABNORMAL HIGH (ref 70–99)
Potassium: 3.9 mmol/L (ref 3.5–5.1)
Sodium: 141 mmol/L (ref 135–145)
Total Bilirubin: 0.6 mg/dL (ref 0.3–1.2)
Total Protein: 6.4 g/dL — ABNORMAL LOW (ref 6.5–8.1)

## 2022-12-08 ENCOUNTER — Encounter: Payer: Self-pay | Admitting: Oncology

## 2022-12-08 MED FILL — Fluorouracil IV Soln 5 GM/100ML (50 MG/ML): INTRAVENOUS | Qty: 87 | Status: AC

## 2022-12-08 MED FILL — Fluorouracil IV Soln 2.5 GM/50ML (50 MG/ML): INTRAVENOUS | Qty: 14 | Status: AC

## 2022-12-08 MED FILL — Oxaliplatin IV Soln 50 MG/10ML: INTRAVENOUS | Qty: 30 | Status: AC

## 2022-12-08 MED FILL — Fosaprepitant Dimeglumine For IV Infusion 150 MG (Base Eq): INTRAVENOUS | Qty: 5 | Status: AC

## 2022-12-08 MED FILL — Dexamethasone Sodium Phosphate Inj 100 MG/10ML: INTRAMUSCULAR | Qty: 1 | Status: AC

## 2022-12-09 ENCOUNTER — Inpatient Hospital Stay: Payer: 59

## 2022-12-09 ENCOUNTER — Other Ambulatory Visit (HOSPITAL_COMMUNITY): Payer: Self-pay

## 2022-12-09 VITALS — BP 107/74 | HR 100 | Temp 97.5°F | Resp 18 | Ht 68.0 in | Wt 151.0 lb

## 2022-12-09 DIAGNOSIS — Z5111 Encounter for antineoplastic chemotherapy: Secondary | ICD-10-CM | POA: Diagnosis not present

## 2022-12-09 DIAGNOSIS — Z5189 Encounter for other specified aftercare: Secondary | ICD-10-CM | POA: Diagnosis not present

## 2022-12-09 DIAGNOSIS — C7971 Secondary malignant neoplasm of right adrenal gland: Secondary | ICD-10-CM | POA: Diagnosis not present

## 2022-12-09 DIAGNOSIS — C182 Malignant neoplasm of ascending colon: Secondary | ICD-10-CM

## 2022-12-09 DIAGNOSIS — C189 Malignant neoplasm of colon, unspecified: Secondary | ICD-10-CM

## 2022-12-09 DIAGNOSIS — C786 Secondary malignant neoplasm of retroperitoneum and peritoneum: Secondary | ICD-10-CM | POA: Diagnosis not present

## 2022-12-09 DIAGNOSIS — C7801 Secondary malignant neoplasm of right lung: Secondary | ICD-10-CM | POA: Diagnosis not present

## 2022-12-09 DIAGNOSIS — R197 Diarrhea, unspecified: Secondary | ICD-10-CM

## 2022-12-09 DIAGNOSIS — C7931 Secondary malignant neoplasm of brain: Secondary | ICD-10-CM | POA: Diagnosis not present

## 2022-12-09 DIAGNOSIS — C7802 Secondary malignant neoplasm of left lung: Secondary | ICD-10-CM | POA: Diagnosis not present

## 2022-12-09 DIAGNOSIS — C787 Secondary malignant neoplasm of liver and intrahepatic bile duct: Secondary | ICD-10-CM | POA: Diagnosis not present

## 2022-12-09 LAB — COMPREHENSIVE METABOLIC PANEL
ALT: 21 U/L (ref 0–44)
AST: 27 U/L (ref 15–41)
Albumin: 3.3 g/dL — ABNORMAL LOW (ref 3.5–5.0)
Alkaline Phosphatase: 129 U/L — ABNORMAL HIGH (ref 38–126)
Anion gap: 12 (ref 5–15)
BUN: 29 mg/dL — ABNORMAL HIGH (ref 6–20)
CO2: 20 mmol/L — ABNORMAL LOW (ref 22–32)
Calcium: 8.3 mg/dL — ABNORMAL LOW (ref 8.9–10.3)
Chloride: 100 mmol/L (ref 98–111)
Creatinine, Ser: 1.68 mg/dL — ABNORMAL HIGH (ref 0.44–1.00)
GFR, Estimated: 36 mL/min — ABNORMAL LOW (ref >60)
Glucose, Bld: 353 mg/dL — ABNORMAL HIGH (ref 70–99)
Potassium: 4.3 mmol/L (ref 3.5–5.1)
Sodium: 132 mmol/L — ABNORMAL LOW (ref 135–145)
Total Bilirubin: 0.6 mg/dL (ref 0.3–1.2)
Total Protein: 6.2 g/dL — ABNORMAL LOW (ref 6.5–8.1)

## 2022-12-09 LAB — MAGNESIUM: Magnesium: 2 mg/dL (ref 1.7–2.4)

## 2022-12-09 LAB — CEA: CEA: 14.1 ng/mL — ABNORMAL HIGH (ref 0.0–4.7)

## 2022-12-09 MED ORDER — FAMOTIDINE IN NACL 20-0.9 MG/50ML-% IV SOLN
20.0000 mg | Freq: Once | INTRAVENOUS | Status: AC
  Administered 2022-12-09: 20 mg via INTRAVENOUS
  Filled 2022-12-09: qty 50

## 2022-12-09 MED ORDER — FLUOROURACIL CHEMO INJECTION 2.5 GM/50ML
400.0000 mg/m2 | Freq: Once | INTRAVENOUS | Status: AC
  Administered 2022-12-09: 700 mg via INTRAVENOUS
  Filled 2022-12-09: qty 14

## 2022-12-09 MED ORDER — PALONOSETRON HCL INJECTION 0.25 MG/5ML
0.2500 mg | Freq: Once | INTRAVENOUS | Status: AC
  Administered 2022-12-09: 0.25 mg via INTRAVENOUS
  Filled 2022-12-09: qty 5

## 2022-12-09 MED ORDER — DEXTROSE 5 % IV SOLN: Freq: Once | INTRAVENOUS | Status: AC

## 2022-12-09 MED ORDER — SODIUM CHLORIDE 0.9 % IV SOLN
5.0000 mg/kg | Freq: Once | INTRAVENOUS | Status: AC
  Administered 2022-12-09: 350 mg via INTRAVENOUS
  Filled 2022-12-09: qty 14

## 2022-12-09 MED ORDER — SODIUM CHLORIDE 0.9 % IV SOLN
2400.0000 mg/m2 | INTRAVENOUS | Status: DC
  Administered 2022-12-09: 4350 mg via INTRAVENOUS
  Filled 2022-12-09: qty 87

## 2022-12-09 MED ORDER — DIPHENHYDRAMINE HCL 50 MG/ML IJ SOLN
25.0000 mg | Freq: Once | INTRAMUSCULAR | Status: AC
  Administered 2022-12-09: 25 mg via INTRAVENOUS
  Filled 2022-12-09: qty 1

## 2022-12-09 MED ORDER — SODIUM CHLORIDE 0.9 % IV SOLN
10.0000 mg | Freq: Once | INTRAVENOUS | Status: AC
  Administered 2022-12-09: 10 mg via INTRAVENOUS
  Filled 2022-12-09: qty 10

## 2022-12-09 MED ORDER — SODIUM CHLORIDE 0.9 % IV SOLN
150.0000 mg | Freq: Once | INTRAVENOUS | Status: AC
  Administered 2022-12-09: 150 mg via INTRAVENOUS
  Filled 2022-12-09: qty 150

## 2022-12-09 MED ORDER — SODIUM CHLORIDE 0.9 % IV SOLN: Freq: Once | INTRAVENOUS | Status: AC

## 2022-12-09 MED ORDER — OXALIPLATIN CHEMO INJECTION 50 MG/10ML
85.0000 mg/m2 | Freq: Once | INTRAVENOUS | Status: AC
  Administered 2022-12-09: 150 mg via INTRAVENOUS
  Filled 2022-12-09: qty 20

## 2022-12-09 NOTE — Patient Instructions (Signed)
Fluorouracil Injection What is this medication? FLUOROURACIL (flure oh YOOR a sil) treats some types of cancer. It works by slowing down the growth of cancer cells. This medicine may be used for other purposes; ask your health care provider or pharmacist if you have questions. COMMON BRAND NAME(S): Adrucil What should I tell my care team before I take this medication? They need to know if you have any of these conditions: Blood disorders Dihydropyrimidine dehydrogenase (DPD) deficiency Infection, such as chickenpox, cold sores, herpes Kidney disease Liver disease Poor nutrition Recent or ongoing radiation therapy An unusual or allergic reaction to fluorouracil, other medications, foods, dyes, or preservatives If you or your partner are pregnant or trying to get pregnant Breast-feeding How should I use this medication? This medication is injected into a vein. It is administered by your care team in a hospital or clinic setting. Talk to your care team about the use of this medication in children. Special care may be needed. Overdosage: If you think you have taken too much of this medicine contact a poison control center or emergency room at once. NOTE: This medicine is only for you. Do not share this medicine with others. What if I miss a dose? Keep appointments for follow-up doses. It is important not to miss your dose. Call your care team if you are unable to keep an appointment. What may interact with this medication? Do not take this medication with any of the following: Live virus vaccines This medication may also interact with the following: Medications that treat or prevent blood clots, such as warfarin, enoxaparin, dalteparin This list may not describe all possible interactions. Give your health care provider a list of all the medicines, herbs, non-prescription drugs, or dietary supplements you use. Also tell them if you smoke, drink alcohol, or use illegal drugs. Some items may  interact with your medicine. What should I watch for while using this medication? Your condition will be monitored carefully while you are receiving this medication. This medication may make you feel generally unwell. This is not uncommon as chemotherapy can affect healthy cells as well as cancer cells. Report any side effects. Continue your course of treatment even though you feel ill unless your care team tells you to stop. In some cases, you may be given additional medications to help with side effects. Follow all directions for their use. This medication may increase your risk of getting an infection. Call your care team for advice if you get a fever, chills, sore throat, or other symptoms of a cold or flu. Do not treat yourself. Try to avoid being around people who are sick. This medication may increase your risk to bruise or bleed. Call your care team if you notice any unusual bleeding. Be careful brushing or flossing your teeth or using a toothpick because you may get an infection or bleed more easily. If you have any dental work done, tell your dentist you are receiving this medication. Avoid taking medications that contain aspirin, acetaminophen, ibuprofen, naproxen, or ketoprofen unless instructed by your care team. These medications may hide a fever. Do not treat diarrhea with over the counter products. Contact your care team if you have diarrhea that lasts more than 2 days or if it is severe and watery. This medication can make you more sensitive to the sun. Keep out of the sun. If you cannot avoid being in the sun, wear protective clothing and sunscreen. Do not use sun lamps, tanning beds, or tanning booths. Talk to  your care team if you or your partner wish to become pregnant or think you might be pregnant. This medication can cause serious birth defects if taken during pregnancy and for 3 months after the last dose. A reliable form of contraception is recommended while taking this  medication and for 3 months after the last dose. Talk to your care team about effective forms of contraception. Do not father a child while taking this medication and for 3 months after the last dose. Use a condom while having sex during this time period. Do not breastfeed while taking this medication. This medication may cause infertility. Talk to your care team if you are concerned about your fertility. What side effects may I notice from receiving this medication? Side effects that you should report to your care team as soon as possible: Allergic reactions--skin rash, itching, hives, swelling of the face, lips, tongue, or throat Heart attack--pain or tightness in the chest, shoulders, arms, or jaw, nausea, shortness of breath, cold or clammy skin, feeling faint or lightheaded Heart failure--shortness of breath, swelling of the ankles, feet, or hands, sudden weight gain, unusual weakness or fatigue Heart rhythm changes--fast or irregular heartbeat, dizziness, feeling faint or lightheaded, chest pain, trouble breathing High ammonia level--unusual weakness or fatigue, confusion, loss of appetite, nausea, vomiting, seizures Infection--fever, chills, cough, sore throat, wounds that don't heal, pain or trouble when passing urine, general feeling of discomfort or being unwell Low red blood cell level--unusual weakness or fatigue, dizziness, headache, trouble breathing Pain, tingling, or numbness in the hands or feet, muscle weakness, change in vision, confusion or trouble speaking, loss of balance or coordination, trouble walking, seizures Redness, swelling, and blistering of the skin over hands and feet Severe or prolonged diarrhea Unusual bruising or bleeding Side effects that usually do not require medical attention (report to your care team if they continue or are bothersome): Dry skin Headache Increased tears Nausea Pain, redness, or swelling with sores inside the mouth or throat Sensitivity  to light Vomiting This list may not describe all possible side effects. Call your doctor for medical advice about side effects. You may report side effects to FDA at 1-800-FDA-1088. Where should I keep my medication? This medication is given in a hospital or clinic. It will not be stored at home. NOTE: This sheet is a summary. It may not cover all possible information. If you have questions about this medicine, talk to your doctor, pharmacist, or health care provider.  2024 [employer]/Gold Standard (2021-09-23 00:00:00) Oxaliplatin Injection What is this medication? OXALIPLATIN (ox AL i PLA tin) treats colorectal cancer. It works by slowing down the growth of cancer cells. This medicine may be used for other purposes; ask your health care provider or pharmacist if you have questions. COMMON BRAND NAME(S): Eloxatin What should I tell my care team before I take this medication? They need to know if you have any of these conditions: Heart disease History of irregular heartbeat or rhythm Liver disease Low blood cell levels (white cells, red cells, and platelets) Lung or breathing disease, such as asthma Take medications that treat or prevent blood clots Tingling of the fingers, toes, or other nerve disorder An unusual or allergic reaction to oxaliplatin, other medications, foods, dyes, or preservatives If you or your partner are pregnant or trying to get pregnant Breast-feeding How should I use this medication? This medication is injected into a vein. It is given by your care team in a hospital or clinic setting. Talk to your care  team about the use of this medication in children. Special care may be needed. Overdosage: If you think you have taken too much of this medicine contact a poison control center or emergency room at once. NOTE: This medicine is only for you. Do not share this medicine with others. What if I miss a dose? Keep appointments for follow-up doses. It is important not to  miss a dose. Call your care team if you are unable to keep an appointment. What may interact with this medication? Do not take this medication with any of the following: Cisapride Dronedarone Pimozide Thioridazine This medication may also interact with the following: Aspirin and aspirin-like medications Certain medications that treat or prevent blood clots, such as warfarin, apixaban, dabigatran, and rivaroxaban Cisplatin Cyclosporine Diuretics Medications for infection, such as acyclovir, adefovir, amphotericin B, bacitracin, cidofovir, foscarnet, ganciclovir, gentamicin, pentamidine, vancomycin NSAIDs, medications for pain and inflammation, such as ibuprofen or naproxen Other medications that cause heart rhythm changes Pamidronate Zoledronic acid This list may not describe all possible interactions. Give your health care provider a list of all the medicines, herbs, non-prescription drugs, or dietary supplements you use. Also tell them if you smoke, drink alcohol, or use illegal drugs. Some items may interact with your medicine. What should I watch for while using this medication? Your condition will be monitored carefully while you are receiving this medication. You may need blood work while taking this medication. This medication may make you feel generally unwell. This is not uncommon as chemotherapy can affect healthy cells as well as cancer cells. Report any side effects. Continue your course of treatment even though you feel ill unless your care team tells you to stop. This medication may increase your risk of getting an infection. Call your care team for advice if you get a fever, chills, sore throat, or other symptoms of a cold or flu. Do not treat yourself. Try to avoid being around people who are sick. Avoid taking medications that contain aspirin, acetaminophen, ibuprofen, naproxen, or ketoprofen unless instructed by your care team. These medications may hide a fever. Be careful  brushing or flossing your teeth or using a toothpick because you may get an infection or bleed more easily. If you have any dental work done, tell your dentist you are receiving this medication. This medication can make you more sensitive to cold. Do not drink cold drinks or use ice. Cover exposed skin before coming in contact with cold temperatures or cold objects. When out in cold weather wear warm clothing and cover your mouth and nose to warm the air that goes into your lungs. Tell your care team if you get sensitive to the cold. Talk to your care team if you or your partner are pregnant or think either of you might be pregnant. This medication can cause serious birth defects if taken during pregnancy and for 9 months after the last dose. A negative pregnancy test is required before starting this medication. A reliable form of contraception is recommended while taking this medication and for 9 months after the last dose. Talk to your care team about effective forms of contraception. Do not father a child while taking this medication and for 6 months after the last dose. Use a condom while having sex during this time period. Do not breastfeed while taking this medication and for 3 months after the last dose. This medication may cause infertility. Talk to your care team if you are concerned about your fertility. What side effects may  I notice from receiving this medication? Side effects that you should report to your care team as soon as possible: Allergic reactions--skin rash, itching, hives, swelling of the face, lips, tongue, or throat Bleeding--bloody or black, tar-like stools, vomiting blood or brown material that looks like coffee grounds, red or dark brown urine, small red or purple spots on skin, unusual bruising or bleeding Dry cough, shortness of breath or trouble breathing Heart rhythm changes--fast or irregular heartbeat, dizziness, feeling faint or lightheaded, chest pain, trouble  breathing Infection--fever, chills, cough, sore throat, wounds that don't heal, pain or trouble when passing urine, general feeling of discomfort or being unwell Liver injury--right upper belly pain, loss of appetite, nausea, light-colored stool, dark yellow or brown urine, yellowing skin or eyes, unusual weakness or fatigue Low red blood cell level--unusual weakness or fatigue, dizziness, headache, trouble breathing Muscle injury--unusual weakness or fatigue, muscle pain, dark yellow or brown urine, decrease in amount of urine Pain, tingling, or numbness in the hands or feet Sudden and severe headache, confusion, change in vision, seizures, which may be signs of posterior reversible encephalopathy syndrome (PRES) Unusual bruising or bleeding Side effects that usually do not require medical attention (report to your care team if they continue or are bothersome): Diarrhea Nausea Pain, redness, or swelling with sores inside the mouth or throat Unusual weakness or fatigue Vomiting This list may not describe all possible side effects. Call your doctor for medical advice about side effects. You may report side effects to FDA at 1-800-FDA-1088. Where should I keep my medication? This medication is given in a hospital or clinic. It will not be stored at home. NOTE: This sheet is a summary. It may not cover all possible information. If you have questions about this medicine, talk to your doctor, pharmacist, or health care provider.  2024 [employer]/Gold Standard (2022-07-26 00:00:00) Bevacizumab Injection What is this medication? BEVACIZUMAB (be va SIZ yoo mab) treats some types of cancer. It works by blocking a protein that causes cancer cells to grow and multiply. This helps to slow or stop the spread of cancer cells. It is a monoclonal antibody. This medicine may be used for other purposes; ask your health care provider or pharmacist if you have questions. COMMON BRAND NAME(S): Alymsys, Avastin,  MVASI, Omer Jack What should I tell my care team before I take this medication? They need to know if you have any of these conditions: Blood clots Coughing up blood Having or recent surgery Heart failure High blood pressure History of a connection between 2 or more body parts that do not usually connect (fistula) History of a tear in your stomach or intestines Protein in your urine An unusual or allergic reaction to bevacizumab, other medications, foods, dyes, or preservatives Pregnant or trying to get pregnant Breast-feeding How should I use this medication? This medication is injected into a vein. It is given by your care team in a hospital or clinic setting. Talk to your care team the use of this medication in children. Special care may be needed. Overdosage: If you think you have taken too much of this medicine contact a poison control center or emergency room at once. NOTE: This medicine is only for you. Do not share this medicine with others. What if I miss a dose? Keep appointments for follow-up doses. It is important not to miss your dose. Call your care team if you are unable to keep an appointment. What may interact with this medication? Interactions are not expected. This list may  not describe all possible interactions. Give your health care provider a list of all the medicines, herbs, non-prescription drugs, or dietary supplements you use. Also tell them if you smoke, drink alcohol, or use illegal drugs. Some items may interact with your medicine. What should I watch for while using this medication? Your condition will be monitored carefully while you are receiving this medication. You may need blood work while taking this medication. This medication may make you feel generally unwell. This is not uncommon as chemotherapy can affect healthy cells as well as cancer cells. Report any side effects. Continue your course of treatment even though you feel ill unless your care team tells  you to stop. This medication may increase your risk to bruise or bleed. Call your care team if you notice any unusual bleeding. Before having surgery, talk to your care team to make sure it is ok. This medication can increase the risk of poor healing of your surgical site or wound. You will need to stop this medication for 28 days before surgery. After surgery, wait at least 28 days before restarting this medication. Make sure the surgical site or wound is healed enough before restarting this medication. Talk to your care team if questions. Talk to your care team if you may be pregnant. Serious birth defects can occur if you take this medication during pregnancy and for 6 months after the last dose. Contraception is recommended while taking this medication and for 6 months after the last dose. Your care team can help you find the option that works for you. Do not breastfeed while taking this medication and for 6 months after the last dose. This medication can cause infertility. Talk to your care team if you are concerned about your fertility. What side effects may I notice from receiving this medication? Side effects that you should report to your care team as soon as possible: Allergic reactions--skin rash, itching, hives, swelling of the face, lips, tongue, or throat Bleeding--bloody or black, tar-like stools, vomiting blood or brown material that looks like coffee grounds, red or dark brown urine, small red or purple spots on skin, unusual bruising or bleeding Blood clot--pain, swelling, or warmth in the leg, shortness of breath, chest pain Heart attack--pain or tightness in the chest, shoulders, arms, or jaw, nausea, shortness of breath, cold or clammy skin, feeling faint or lightheaded Heart failure--shortness of breath, swelling of the ankles, feet, or hands, sudden weight gain, unusual weakness or fatigue Increase in blood pressure Infection--fever, chills, cough, sore throat, wounds that don't  heal, pain or trouble when passing urine, general feeling of discomfort or being unwell Infusion reactions--chest pain, shortness of breath or trouble breathing, feeling faint or lightheaded Kidney injury--decrease in the amount of urine, swelling of the ankles, hands, or feet Stomach pain that is severe, does not go away, or gets worse Stroke--sudden numbness or weakness of the face, arm, or leg, trouble speaking, confusion, trouble walking, loss of balance or coordination, dizziness, severe headache, change in vision Sudden and severe headache, confusion, change in vision, seizures, which may be signs of posterior reversible encephalopathy syndrome (PRES) Side effects that usually do not require medical attention (report to your care team if they continue or are bothersome): Back pain Change in taste Diarrhea Dry skin Increased tears Nosebleed This list may not describe all possible side effects. Call your doctor for medical advice about side effects. You may report side effects to FDA at 1-800-FDA-1088. Where should I keep my medication? This  medication is given in a hospital or clinic. It will not be stored at home. NOTE: This sheet is a summary. It may not cover all possible information. If you have questions about this medicine, talk to your doctor, pharmacist, or health care provider.  2024 [employer]/Gold Standard (2021-10-03 00:00:00)

## 2022-12-09 NOTE — Progress Notes (Signed)
Pt requested that the last be infused due to her diarrhea yesterday

## 2022-12-10 ENCOUNTER — Encounter: Payer: Self-pay | Admitting: Oncology

## 2022-12-10 ENCOUNTER — Telehealth: Payer: Self-pay | Admitting: Pharmacist

## 2022-12-10 DIAGNOSIS — H35372 Puckering of macula, left eye: Secondary | ICD-10-CM | POA: Diagnosis not present

## 2022-12-10 NOTE — Telephone Encounter (Signed)
I called Shannon [name] to discuss recent labwork that showed elevated creatinine and glucose.  Shannon [name] stated that her glucose readings at home have been much lower and she is currently on insulin.  She will continue to monitor closely at home.  She agreed to come in tomorrow and Monday for some IVF to help with her dehydration.  She will try to drink more fluids at home.  She was appreciative of the call.

## 2022-12-11 ENCOUNTER — Inpatient Hospital Stay: Payer: 59

## 2022-12-11 ENCOUNTER — Encounter: Payer: Self-pay | Admitting: Oncology

## 2022-12-11 VITALS — BP 116/85 | HR 111 | Temp 98.0°F | Resp 16 | Ht 68.0 in | Wt 156.1 lb

## 2022-12-11 DIAGNOSIS — C189 Malignant neoplasm of colon, unspecified: Secondary | ICD-10-CM

## 2022-12-11 DIAGNOSIS — C787 Secondary malignant neoplasm of liver and intrahepatic bile duct: Secondary | ICD-10-CM | POA: Diagnosis not present

## 2022-12-11 DIAGNOSIS — C786 Secondary malignant neoplasm of retroperitoneum and peritoneum: Secondary | ICD-10-CM | POA: Diagnosis not present

## 2022-12-11 DIAGNOSIS — C182 Malignant neoplasm of ascending colon: Secondary | ICD-10-CM

## 2022-12-11 DIAGNOSIS — C7931 Secondary malignant neoplasm of brain: Secondary | ICD-10-CM | POA: Diagnosis not present

## 2022-12-11 DIAGNOSIS — C7801 Secondary malignant neoplasm of right lung: Secondary | ICD-10-CM | POA: Diagnosis not present

## 2022-12-11 DIAGNOSIS — C7971 Secondary malignant neoplasm of right adrenal gland: Secondary | ICD-10-CM | POA: Diagnosis not present

## 2022-12-11 DIAGNOSIS — C7802 Secondary malignant neoplasm of left lung: Secondary | ICD-10-CM | POA: Diagnosis not present

## 2022-12-11 DIAGNOSIS — Z5111 Encounter for antineoplastic chemotherapy: Secondary | ICD-10-CM | POA: Diagnosis not present

## 2022-12-11 DIAGNOSIS — Z5189 Encounter for other specified aftercare: Secondary | ICD-10-CM | POA: Diagnosis not present

## 2022-12-11 MED ORDER — SODIUM CHLORIDE 0.9% FLUSH
10.0000 mL | INTRAVENOUS | Status: DC | PRN
  Administered 2022-12-11: 10 mL

## 2022-12-11 MED ORDER — SODIUM CHLORIDE 0.9 % IV SOLN: Freq: Once | INTRAVENOUS | Status: AC

## 2022-12-11 MED ORDER — HEPARIN SOD (PORK) LOCK FLUSH 100 UNIT/ML IV SOLN
500.0000 [IU] | Freq: Once | INTRAVENOUS | Status: AC | PRN
  Administered 2022-12-11: 500 [IU]

## 2022-12-11 NOTE — Patient Instructions (Signed)
Dehydration, Adult Dehydration is a condition in which there is not enough water or other fluids in the body. This happens when a person loses more fluids than they take in. Important organs cannot work right without the right amount of fluids. Any loss of fluids from the body can cause dehydration. Dehydration can be mild, worse, or very bad. It should be treated right away to keep it from getting very bad. What are the causes? Conditions that cause loss of water in the body. They include: Watery poop (diarrhea). Vomiting. Sweating a lot. Fever. Infection. Peeing (urinating) a lot. Not drinking enough fluids. Certain medicines, such as medicines that take extra fluid out of the body (diuretics). Lack of safe drinking water. Not being able to get enough water and food. What increases the risk? Having a long-term (chronic) illness that has not been treated the right way, such as: Diabetes. Heart disease. Kidney disease. Being 65 years of age or older. Having a disability. Living in a place that is high above the ground or sea (high in altitude). The thinner, drier air causes more fluid loss. Doing exercises that put stress on your body for a long time. Being active when in hot places. What are the signs or symptoms? Symptoms of dehydration depend on how bad it is. Mild or worse dehydration Thirst. Dry lips or dry mouth. Feeling dizzy or light-headed. Muscle cramps. Passing little pee or dark pee. Pee may be the color of tea. Headache. Very bad dehydration Changes in skin. Skin may: Be cold to the touch (clammy). Be blotchy or pale. Not go back to normal right after you pinch it and let it go. Little or no tears, pee, or sweat. Fast breathing. Low blood pressure. Weak pulse. Pulse that is more than 100 beats a minute when you are sitting still. Other changes, such as: Feeling very thirsty. Eyes that look hollow (sunken). Cold hands and feet. Being confused. Being very  tired (lethargic) or having trouble waking from sleep. Losing weight. Loss of consciousness. How is this treated? Treatment for this condition depends on how bad your dehydration is. Treatment should start right away. Do not wait until your condition gets very bad. Very bad dehydration is an emergency. You will need to go to a hospital. Mild or worse dehydration can be treated at home. You may be asked to: Drink more fluids. Drink an oral rehydration solution (ORS). This drink gives you the right amount of fluids, salts, and minerals (electrolytes). Very bad dehydration can be treated: With fluids through an IV tube. By correcting low levels of electrolytes in the body. By treating the problem that caused your dehydration. Follow these instructions at home: Oral rehydration solution If told by your doctor, drink an ORS: Make an ORS. Use instructions on the package. Start by drinking small amounts, about  cup (120 mL) every 5-10 minutes. Slowly drink more until you have had the amount that your doctor said to have.  Eating and drinking  Drink enough clear fluid to keep your pee pale yellow. If you were told to drink an ORS, finish the ORS first. Then, start slowly drinking other clear fluids. Drink fluids such as: Water. Do not drink only water. Doing that can make the salt (sodium) level in your body get too low. Water from ice chips you suck on. Fruit juice that you have added water to (diluted). Low-calorie sports drinks. Eat foods that have the right amounts of salts and minerals, such as bananas, oranges, potatoes,   tomatoes, or spinach. Do not drink alcohol. Avoid drinks that have caffeine or sugar. These include:: High-calorie sports drinks. Fruit juice that you did not add water to. Soda. Coffee or energy drinks. Avoid foods that are greasy or have a lot of fat or sugar. General instructions Take over-the-counter and prescription medicines only as told by your doctor. Do  not take sodium tablets. Doing that can make the salt level in your body get too high. Return to your normal activities as told by your doctor. Ask your doctor what activities are safe for you. Keep all follow-up visits. Your doctor may check and change your treatment. Contact a doctor if: You have pain in your belly (abdomen) and the pain: Gets worse. Stays in one place. You have a rash. You have a stiff neck. You get angry or annoyed more easily than normal. You are more tired or have a harder time waking than normal. You feel weak or dizzy. You feel very thirsty. Get help right away if: You have any symptoms of very bad dehydration. You vomit every time you eat or drink. Your vomiting gets worse, does not go away, or you vomit blood or green stuff. You are getting treatment, but symptoms are getting worse. You have a fever. You have a very bad headache. You have: Diarrhea that gets worse or does not go away. Blood in your poop (stool). This may cause poop to look black and tarry. No pee in 6-8 hours. Only a small amount of pee in 6-8 hours, and the pee is very dark. You have trouble breathing. These symptoms may be an emergency. Get help right away. Call 911. Do not wait to see if the symptoms will go away. Do not drive yourself to the hospital. This information is not intended to replace advice given to you by your health care provider. Make sure you discuss any questions you have with your health care provider. Document Revised: 12/15/2021 Document Reviewed: 12/15/2021 Elsevier Patient Education  2024 [employer]  

## 2022-12-14 ENCOUNTER — Other Ambulatory Visit: Payer: Self-pay | Admitting: Family

## 2022-12-14 ENCOUNTER — Other Ambulatory Visit (HOSPITAL_COMMUNITY): Payer: Self-pay

## 2022-12-14 ENCOUNTER — Inpatient Hospital Stay: Payer: 59

## 2022-12-14 VITALS — BP 133/81 | HR 93 | Temp 98.3°F | Resp 18 | Ht 68.0 in | Wt 156.1 lb

## 2022-12-14 DIAGNOSIS — Z5111 Encounter for antineoplastic chemotherapy: Secondary | ICD-10-CM | POA: Diagnosis not present

## 2022-12-14 DIAGNOSIS — E7800 Pure hypercholesterolemia, unspecified: Secondary | ICD-10-CM | POA: Diagnosis not present

## 2022-12-14 DIAGNOSIS — C7931 Secondary malignant neoplasm of brain: Secondary | ICD-10-CM | POA: Diagnosis not present

## 2022-12-14 DIAGNOSIS — Z5189 Encounter for other specified aftercare: Secondary | ICD-10-CM | POA: Diagnosis not present

## 2022-12-14 DIAGNOSIS — C189 Malignant neoplasm of colon, unspecified: Secondary | ICD-10-CM

## 2022-12-14 DIAGNOSIS — C7801 Secondary malignant neoplasm of right lung: Secondary | ICD-10-CM | POA: Diagnosis not present

## 2022-12-14 DIAGNOSIS — Z6825 Body mass index (BMI) 25.0-25.9, adult: Secondary | ICD-10-CM | POA: Diagnosis not present

## 2022-12-14 DIAGNOSIS — C786 Secondary malignant neoplasm of retroperitoneum and peritoneum: Secondary | ICD-10-CM | POA: Diagnosis not present

## 2022-12-14 DIAGNOSIS — F339 Major depressive disorder, recurrent, unspecified: Secondary | ICD-10-CM | POA: Diagnosis not present

## 2022-12-14 DIAGNOSIS — C787 Secondary malignant neoplasm of liver and intrahepatic bile duct: Secondary | ICD-10-CM | POA: Diagnosis not present

## 2022-12-14 DIAGNOSIS — Z79899 Other long term (current) drug therapy: Secondary | ICD-10-CM | POA: Diagnosis not present

## 2022-12-14 DIAGNOSIS — C7971 Secondary malignant neoplasm of right adrenal gland: Secondary | ICD-10-CM | POA: Diagnosis not present

## 2022-12-14 DIAGNOSIS — C182 Malignant neoplasm of ascending colon: Secondary | ICD-10-CM

## 2022-12-14 DIAGNOSIS — C7800 Secondary malignant neoplasm of unspecified lung: Secondary | ICD-10-CM

## 2022-12-14 DIAGNOSIS — C7802 Secondary malignant neoplasm of left lung: Secondary | ICD-10-CM | POA: Diagnosis not present

## 2022-12-14 DIAGNOSIS — E1169 Type 2 diabetes mellitus with other specified complication: Secondary | ICD-10-CM | POA: Diagnosis not present

## 2022-12-14 DIAGNOSIS — E039 Hypothyroidism, unspecified: Secondary | ICD-10-CM | POA: Diagnosis not present

## 2022-12-14 DIAGNOSIS — Z1231 Encounter for screening mammogram for malignant neoplasm of breast: Secondary | ICD-10-CM

## 2022-12-14 MED ORDER — SODIUM CHLORIDE 0.9 % IV SOLN: Freq: Once | INTRAVENOUS | Status: AC

## 2022-12-14 MED ORDER — FILGRASTIM-SNDZ 480 MCG/0.8ML IJ SOSY
480.0000 ug | PREFILLED_SYRINGE | Freq: Once | INTRAMUSCULAR | Status: AC
  Administered 2022-12-14: 480 ug via SUBCUTANEOUS
  Filled 2022-12-14: qty 0.8

## 2022-12-14 MED ORDER — TRULICITY 3 MG/0.5ML ~~LOC~~ SOAJ
3.0000 mg | SUBCUTANEOUS | 1 refills | Status: AC
Start: 2022-12-14 — End: ?
  Filled 2022-12-14 – 2023-11-02 (×7): qty 2, 28d supply, fill #0

## 2022-12-14 NOTE — Patient Instructions (Signed)
IV Infusion Therapy IV infusion therapy is a type of treatment to deliver a liquid substance (infusion) directly into a vein through a small, thin tube (catheter). You may have IV infusion therapy to receive an infusion of: Fluids. Medicines. Nutrition. Chemotherapy. This is the use of medicines to stop or slow the growth of cancer cells. Blood or blood products. X-ray dye that is given before an imaging procedure, such as an MRI or a CT scan. Tell a health care provider about: Any allergies you have. All medicines you are taking, including vitamins, herbs, eye drops, creams, and over-the-counter medicines. Any problems you or family members have had with anesthetic medicines or X-ray dyes. Any blood disorders you have. Any surgeries you have had, including an axillary lymph node dissection and an arteriovenous fistula for dialysis. Any medical conditions you have. Whether you are pregnant or may be pregnant. Any history of IV drug use. Any history of health care providers not being able to find a vein for an IV or blood draw. What are the risks? Generally, this is a safe procedure. However, problems may occur, including: Pain or bruising. Bleeding. Infection. Failure to place the catheter due to inability to find a vein. Leaking or blockage of the catheter (infiltration). Damage to blood vessels or nerves. Allergic reactions to medicines or dyes. A blood clot. What happens before the procedure? Follow instructions from your health care provider about eating or drinking restrictions. Ask your health care provider about changing or stopping your regular medicines. This is especially important if you are taking diabetes medicines or blood thinners. Learn as much as you can about your treatment. Ask your health care provider for reliable resources, such as websites, books, videos, and people, to help you learn about the treatment you will be having. What happens during the procedure?      Placing the catheter IV infusion therapy starts with a procedure to place a catheter into a vein. An IV tube will be attached to the catheter to allow the infusion to flow into your bloodstream. Your catheter may be placed: Into a vein that is usually in the bend of the elbow, in the forearm, or in the back of the hand (peripheral IV catheter). This type of catheter may need to be inserted into a vein each time you get an infusion. Into a vein near your elbow (midline catheter or PICC). This type of catheter may stay in place for weeks or months at a time so you can receive repeated infusions through it. Into a vein near your neck that leads to your heart (non-tunneled catheter). This type of catheter is only used for short amounts of time because it can cause infection. Through the skin of your chest and into a large vein that leads to your heart (tunneled catheter). This type of catheter may stay in your body for months or years. So that it connects to an implanted port. An implanted port is a device that is surgically inserted under the skin of the chest to provide long-term IV access. The catheter will connect the port to a large vein in the chest or upper arm. A port may be kept in place for many months or years. Each time you have an infusion, a needle will be inserted through your skin to connect the catheter to the port. After your catheter is placed, your health care team will lower your risk of infection by washing the skin at the IV site with a germ-killing (antiseptic) solution.  Doing the infusion To start the infusion, your health care provider will: Attach the IV tubing to your catheter. Use a tape or an adhesive bandage (dressing) to hold the catheter and tubing in place against your skin. Use an IV pump to control the flow of the IV infusion, if needed. During the infusion, your health care provider will check the area to make sure: There is no swelling or pain. Your IV infusion  is flowing through the catheter properly. After the infusion, your health care provider will: Remove the dressing or tape. Disconnect the tubing from the catheter. Remove the catheter, if you have a peripheral IV. Apply pressure over the IV insertion site to stop bleeding, then cover the area with a dressing. If you have an implanted port, PICC, non-tunneled, or tunneled catheter, your health care team may leave the catheter in place. This depends on your treatment, your medical condition, and what type of catheter you have. The procedure may vary among health care providers and hospitals. What can I expect after the procedure? Your blood pressure, heart rate, breathing rate, and blood oxygen level may be monitored until you leave the hospital or clinic. Follow these instructions at home: Take over-the-counter and prescription medicines only as told by your health care provider. Change or remove any dressings only as told by your health care provider. Return to your normal activities as told by your health care provider. Ask your health care provider what activities are safe for you. Do not take baths, swim, or use a hot tub until your health care provider approves. Ask your health care provider if you may take showers. Check your IV insertion site every day for signs of infection. Check for: Redness, swelling, or pain. Fluid or blood. If fluid or blood drains from your IV site, use your hands to press down firmly on the area for a minute or two. Doing this should stop the bleeding. Warmth. Pus or a bad smell. Keep all follow-up visits. This is important. Contact a health care provider if: You have signs of infection around your IV site. You have a fever or chills. You have fluid or blood coming from your IV site that does not stop after you apply pressure to the site. You have itchy skin. You have a rash or blisters. You have itchy, red, swollen areas of skin (hives). Get help right away  if: You have trouble breathing. You have chest pain. These symptoms may represent a serious problem that is an emergency. Do not wait to see if the symptoms will go away. Get medical help right away. Call your local emergency services (911 in the [place] Do not drive yourself to the hospital. Summary IV infusion therapy is a type of treatment to deliver a liquid substance (infusion) directly into a vein. Check your IV insertion site every day for signs of infection, such as redness or swelling. Change or remove bandages (dressings) only as told by your health care provider. Call your health care provider if you notice any signs of infection around your IV site or have a rash or blisters. This information is not intended to replace advice given to you by your health care provider. Make sure you discuss any questions you have with your health care provider. Document Revised: 12/30/2019 Document Reviewed: 12/30/2019 Elsevier Patient Education  2024 [employer] Filgrastim Injection What is this medication? FILGRASTIM (fil GRA stim) lowers the risk of infection in people who are receiving chemotherapy. It works by Building services engineer  body make more white blood cells, which protects your body from infection. It may also be used to help people who have been exposed to high doses of radiation. It can be used to help prepare your body before a stem cell transplant. It works by helping your bone marrow make and release stem cells into the blood. This medicine may be used for other purposes; ask your health care provider or pharmacist if you have questions. COMMON BRAND NAME(S): Neupogen, Nivestym, Releuko, Zarxio What should I tell my care team before I take this medication? They need to know if you have any of these conditions: History of blood diseases, such as sickle cell anemia Kidney disease Recent or ongoing radiation An unusual or allergic reaction to filgrastim, pegfilgrastim, latex, rubber, other  medications, foods, dyes, or preservatives Pregnant or trying to get pregnant Breast-feeding How should I use this medication? This medication is injected under the skin or into a vein. It is usually given by your care team in a hospital or clinic setting. It may be given at home. If you get this medication at home, you will be taught how to prepare and give it. Use exactly as directed. Take it as directed on the prescription label at the same time every day. Keep taking it unless your care team tells you to stop. It is important that you put your used needles and syringes in a special sharps container. Do not put them in a trash can. If you do not have a sharps container, call your pharmacist or care team to get one. This medication comes with INSTRUCTIONS FOR USE. Ask your pharmacist for directions on how to use this medication. Read the information carefully. Talk to your pharmacist or care team if you have questions. Talk to your care team about the use of this medication in children. While it may be prescribed for children for selected conditions, precautions do apply. Overdosage: If you think you have taken too much of this medicine contact a poison control center or emergency room at once. NOTE: This medicine is only for you. Do not share this medicine with others. What if I miss a dose? It is important not to miss any doses. Talk to your care team about what to do if you miss a dose. What may interact with this medication? Medications that may cause a release of neutrophils, such as lithium This list may not describe all possible interactions. Give your health care provider a list of all the medicines, herbs, non-prescription drugs, or dietary supplements you use. Also tell them if you smoke, drink alcohol, or use illegal drugs. Some items may interact with your medicine. What should I watch for while using this medication? Your condition will be monitored carefully while you are receiving  this medication. You may need bloodwork while taking this medication. Talk to your care team about your risk of cancer. You may be more at risk for certain types of cancer if you take this medication. What side effects may I notice from receiving this medication? Side effects that you should report to your care team as soon as possible: Allergic reactions--skin rash, itching, hives, swelling of the face, lips, tongue, or throat Capillary leak syndrome--stomach or muscle pain, unusual weakness or fatigue, feeling faint or lightheaded, decrease in the amount of urine, swelling of the ankles, hands, or feet, trouble breathing High white blood cell level--fever, fatigue, trouble breathing, night sweats, change in vision, weight loss Inflammation of the aorta--fever, fatigue, back, chest,  or stomach pain, severe headache Kidney injury (glomerulonephritis)--decrease in the amount of urine, red or dark brown urine, foamy or bubbly urine, swelling of the ankles, hands, or feet Shortness of breath or trouble breathing Spleen injury--pain in upper left stomach or shoulder Unusual bruising or bleeding Side effects that usually do not require medical attention (report to your care team if they continue or are bothersome): Back pain Bone pain Fatigue Fever Headache Nausea This list may not describe all possible side effects. Call your doctor for medical advice about side effects. You may report side effects to FDA at 1-800-FDA-1088. Where should I keep my medication? Keep out of the reach of children and pets. Keep this medication in the original packaging until you are ready to take it. Protect from light. See product for storage information. Each product may have different instructions. Get rid of any unused medication after the expiration date. To get rid of medications that are no longer needed or have expired: Take the medication to a medications take-back program. Check with your pharmacy or law  enforcement to find a location. If you cannot return the medication, ask your pharmacist or care team how to get rid of this medication safely. NOTE: This sheet is a summary. It may not cover all possible information. If you have questions about this medicine, talk to your doctor, pharmacist, or health care provider.  2024 Elsevier/[employer] Standard (2021-10-09 00:00:00)

## 2022-12-15 ENCOUNTER — Inpatient Hospital Stay: Payer: 59

## 2022-12-15 ENCOUNTER — Other Ambulatory Visit (HOSPITAL_COMMUNITY): Payer: Self-pay

## 2022-12-15 ENCOUNTER — Encounter: Payer: Self-pay | Admitting: Oncology

## 2022-12-15 VITALS — BP 126/86 | HR 100 | Temp 98.2°F | Resp 20

## 2022-12-15 DIAGNOSIS — C182 Malignant neoplasm of ascending colon: Secondary | ICD-10-CM

## 2022-12-15 DIAGNOSIS — C787 Secondary malignant neoplasm of liver and intrahepatic bile duct: Secondary | ICD-10-CM | POA: Diagnosis not present

## 2022-12-15 DIAGNOSIS — C7802 Secondary malignant neoplasm of left lung: Secondary | ICD-10-CM | POA: Diagnosis not present

## 2022-12-15 DIAGNOSIS — C7801 Secondary malignant neoplasm of right lung: Secondary | ICD-10-CM | POA: Diagnosis not present

## 2022-12-15 DIAGNOSIS — C7931 Secondary malignant neoplasm of brain: Secondary | ICD-10-CM | POA: Diagnosis not present

## 2022-12-15 DIAGNOSIS — Z5111 Encounter for antineoplastic chemotherapy: Secondary | ICD-10-CM | POA: Diagnosis not present

## 2022-12-15 DIAGNOSIS — Z5189 Encounter for other specified aftercare: Secondary | ICD-10-CM | POA: Diagnosis not present

## 2022-12-15 DIAGNOSIS — C189 Malignant neoplasm of colon, unspecified: Secondary | ICD-10-CM

## 2022-12-15 DIAGNOSIS — C7971 Secondary malignant neoplasm of right adrenal gland: Secondary | ICD-10-CM | POA: Diagnosis not present

## 2022-12-15 DIAGNOSIS — C786 Secondary malignant neoplasm of retroperitoneum and peritoneum: Secondary | ICD-10-CM | POA: Diagnosis not present

## 2022-12-15 MED ORDER — FILGRASTIM-SNDZ 480 MCG/0.8ML IJ SOSY
480.0000 ug | PREFILLED_SYRINGE | Freq: Once | INTRAMUSCULAR | Status: AC
  Administered 2022-12-15: 480 ug via SUBCUTANEOUS

## 2022-12-15 NOTE — Patient Instructions (Signed)
Filgrastim Injection What is this medication? FILGRASTIM (fil GRA stim) lowers the risk of infection in people who are receiving chemotherapy. It works by helping your body make more white blood cells, which protects your body from infection. It may also be used to help people who have been exposed to high doses of radiation. It can be used to help prepare your body before a stem cell transplant. It works by helping your bone marrow make and release stem cells into the blood. This medicine may be used for other purposes; ask your health care provider or pharmacist if you have questions. COMMON BRAND NAME(S): Neupogen, Nivestym, Releuko, Zarxio What should I tell my care team before I take this medication? They need to know if you have any of these conditions: History of blood diseases, such as sickle cell anemia Kidney disease Recent or ongoing radiation An unusual or allergic reaction to filgrastim, pegfilgrastim, latex, rubber, other medications, foods, dyes, or preservatives Pregnant or trying to get pregnant Breast-feeding How should I use this medication? This medication is injected under the skin or into a vein. It is usually given by your care team in a hospital or clinic setting. It may be given at home. If you get this medication at home, you will be taught how to prepare and give it. Use exactly as directed. Take it as directed on the prescription label at the same time every day. Keep taking it unless your care team tells you to stop. It is important that you put your used needles and syringes in a special sharps container. Do not put them in a trash can. If you do not have a sharps container, call your pharmacist or care team to get one. This medication comes with INSTRUCTIONS FOR USE. Ask your pharmacist for directions on how to use this medication. Read the information carefully. Talk to your pharmacist or care team if you have questions. Talk to your care team about the use of this  medication in children. While it may be prescribed for children for selected conditions, precautions do apply. Overdosage: If you think you have taken too much of this medicine contact a poison control center or emergency room at once. NOTE: This medicine is only for you. Do not share this medicine with others. What if I miss a dose? It is important not to miss any doses. Talk to your care team about what to do if you miss a dose. What may interact with this medication? Medications that may cause a release of neutrophils, such as lithium This list may not describe all possible interactions. Give your health care provider a list of all the medicines, herbs, non-prescription drugs, or dietary supplements you use. Also tell them if you smoke, drink alcohol, or use illegal drugs. Some items may interact with your medicine. What should I watch for while using this medication? Your condition will be monitored carefully while you are receiving this medication. You may need bloodwork while taking this medication. Talk to your care team about your risk of cancer. You may be more at risk for certain types of cancer if you take this medication. What side effects may I notice from receiving this medication? Side effects that you should report to your care team as soon as possible: Allergic reactions--skin rash, itching, hives, swelling of the face, lips, tongue, or throat Capillary leak syndrome--stomach or muscle pain, unusual weakness or fatigue, feeling faint or lightheaded, decrease in the amount of urine, swelling of the ankles, hands, or   feet, trouble breathing High white blood cell level--fever, fatigue, trouble breathing, night sweats, change in vision, weight loss Inflammation of the aorta--fever, fatigue, back, chest, or stomach pain, severe headache Kidney injury (glomerulonephritis)--decrease in the amount of urine, red or dark brown urine, foamy or bubbly urine, swelling of the ankles, hands, or  feet Shortness of breath or trouble breathing Spleen injury--pain in upper left stomach or shoulder Unusual bruising or bleeding Side effects that usually do not require medical attention (report to your care team if they continue or are bothersome): Back pain Bone pain Fatigue Fever Headache Nausea This list may not describe all possible side effects. Call your doctor for medical advice about side effects. You may report side effects to FDA at 1-800-FDA-1088. Where should I keep my medication? Keep out of the reach of children and pets. Keep this medication in the original packaging until you are ready to take it. Protect from light. See product for storage information. Each product may have different instructions. Get rid of any unused medication after the expiration date. To get rid of medications that are no longer needed or have expired: Take the medication to a medications take-back program. Check with your pharmacy or law enforcement to find a location. If you cannot return the medication, ask your pharmacist or care team how to get rid of this medication safely. NOTE: This sheet is a summary. It may not cover all possible information. If you have questions about this medicine, talk to your doctor, pharmacist, or health care provider.  2024 Elsevier/Gold Standard (2021-10-09 00:00:00)  

## 2022-12-16 ENCOUNTER — Other Ambulatory Visit (HOSPITAL_COMMUNITY): Payer: Self-pay

## 2022-12-16 ENCOUNTER — Inpatient Hospital Stay: Payer: 59

## 2022-12-16 VITALS — BP 106/80 | HR 68 | Temp 98.8°F | Wt 156.1 lb

## 2022-12-16 DIAGNOSIS — C7971 Secondary malignant neoplasm of right adrenal gland: Secondary | ICD-10-CM | POA: Diagnosis not present

## 2022-12-16 DIAGNOSIS — C7931 Secondary malignant neoplasm of brain: Secondary | ICD-10-CM | POA: Diagnosis not present

## 2022-12-16 DIAGNOSIS — C189 Malignant neoplasm of colon, unspecified: Secondary | ICD-10-CM

## 2022-12-16 DIAGNOSIS — Z5111 Encounter for antineoplastic chemotherapy: Secondary | ICD-10-CM | POA: Diagnosis not present

## 2022-12-16 DIAGNOSIS — C182 Malignant neoplasm of ascending colon: Secondary | ICD-10-CM

## 2022-12-16 DIAGNOSIS — Z5189 Encounter for other specified aftercare: Secondary | ICD-10-CM | POA: Diagnosis not present

## 2022-12-16 DIAGNOSIS — C7801 Secondary malignant neoplasm of right lung: Secondary | ICD-10-CM | POA: Diagnosis not present

## 2022-12-16 DIAGNOSIS — C787 Secondary malignant neoplasm of liver and intrahepatic bile duct: Secondary | ICD-10-CM | POA: Diagnosis not present

## 2022-12-16 DIAGNOSIS — C786 Secondary malignant neoplasm of retroperitoneum and peritoneum: Secondary | ICD-10-CM | POA: Diagnosis not present

## 2022-12-16 DIAGNOSIS — C7802 Secondary malignant neoplasm of left lung: Secondary | ICD-10-CM | POA: Diagnosis not present

## 2022-12-16 MED ORDER — FILGRASTIM-SNDZ 480 MCG/0.8ML IJ SOSY
480.0000 ug | PREFILLED_SYRINGE | Freq: Once | INTRAMUSCULAR | Status: AC
  Administered 2022-12-16: 480 ug via SUBCUTANEOUS
  Filled 2022-12-16: qty 0.8

## 2022-12-16 NOTE — Patient Instructions (Signed)
Filgrastim Injection What is this medication? FILGRASTIM (fil GRA stim) lowers the risk of infection in people who are receiving chemotherapy. It works by helping your body make more white blood cells, which protects your body from infection. It may also be used to help people who have been exposed to high doses of radiation. It can be used to help prepare your body before a stem cell transplant. It works by helping your bone marrow make and release stem cells into the blood. This medicine may be used for other purposes; ask your health care provider or pharmacist if you have questions. COMMON BRAND NAME(S): Neupogen, Nivestym, Releuko, Zarxio What should I tell my care team before I take this medication? They need to know if you have any of these conditions: History of blood diseases, such as sickle cell anemia Kidney disease Recent or ongoing radiation An unusual or allergic reaction to filgrastim, pegfilgrastim, latex, rubber, other medications, foods, dyes, or preservatives Pregnant or trying to get pregnant Breast-feeding How should I use this medication? This medication is injected under the skin or into a vein. It is usually given by your care team in a hospital or clinic setting. It may be given at home. If you get this medication at home, you will be taught how to prepare and give it. Use exactly as directed. Take it as directed on the prescription label at the same time every day. Keep taking it unless your care team tells you to stop. It is important that you put your used needles and syringes in a special sharps container. Do not put them in a trash can. If you do not have a sharps container, call your pharmacist or care team to get one. This medication comes with INSTRUCTIONS FOR USE. Ask your pharmacist for directions on how to use this medication. Read the information carefully. Talk to your pharmacist or care team if you have questions. Talk to your care team about the use of this  medication in children. While it may be prescribed for children for selected conditions, precautions do apply. Overdosage: If you think you have taken too much of this medicine contact a poison control center or emergency room at once. NOTE: This medicine is only for you. Do not share this medicine with others. What if I miss a dose? It is important not to miss any doses. Talk to your care team about what to do if you miss a dose. What may interact with this medication? Medications that may cause a release of neutrophils, such as lithium This list may not describe all possible interactions. Give your health care provider a list of all the medicines, herbs, non-prescription drugs, or dietary supplements you use. Also tell them if you smoke, drink alcohol, or use illegal drugs. Some items may interact with your medicine. What should I watch for while using this medication? Your condition will be monitored carefully while you are receiving this medication. You may need bloodwork while taking this medication. Talk to your care team about your risk of cancer. You may be more at risk for certain types of cancer if you take this medication. What side effects may I notice from receiving this medication? Side effects that you should report to your care team as soon as possible: Allergic reactions--skin rash, itching, hives, swelling of the face, lips, tongue, or throat Capillary leak syndrome--stomach or muscle pain, unusual weakness or fatigue, feeling faint or lightheaded, decrease in the amount of urine, swelling of the ankles, hands, or   feet, trouble breathing High white blood cell level--fever, fatigue, trouble breathing, night sweats, change in vision, weight loss Inflammation of the aorta--fever, fatigue, back, chest, or stomach pain, severe headache Kidney injury (glomerulonephritis)--decrease in the amount of urine, red or dark brown urine, foamy or bubbly urine, swelling of the ankles, hands, or  feet Shortness of breath or trouble breathing Spleen injury--pain in upper left stomach or shoulder Unusual bruising or bleeding Side effects that usually do not require medical attention (report to your care team if they continue or are bothersome): Back pain Bone pain Fatigue Fever Headache Nausea This list may not describe all possible side effects. Call your doctor for medical advice about side effects. You may report side effects to FDA at 1-800-FDA-1088. Where should I keep my medication? Keep out of the reach of children and pets. Keep this medication in the original packaging until you are ready to take it. Protect from light. See product for storage information. Each product may have different instructions. Get rid of any unused medication after the expiration date. To get rid of medications that are no longer needed or have expired: Take the medication to a medications take-back program. Check with your pharmacy or law enforcement to find a location. If you cannot return the medication, ask your pharmacist or care team how to get rid of this medication safely. NOTE: This sheet is a summary. It may not cover all possible information. If you have questions about this medicine, talk to your doctor, pharmacist, or health care provider.  2024 Elsevier/Gold Standard (2021-10-09 00:00:00)  

## 2022-12-18 DIAGNOSIS — K769 Liver disease, unspecified: Secondary | ICD-10-CM | POA: Diagnosis not present

## 2022-12-18 DIAGNOSIS — C182 Malignant neoplasm of ascending colon: Secondary | ICD-10-CM | POA: Diagnosis not present

## 2022-12-18 DIAGNOSIS — J984 Other disorders of lung: Secondary | ICD-10-CM | POA: Diagnosis not present

## 2022-12-18 DIAGNOSIS — R918 Other nonspecific abnormal finding of lung field: Secondary | ICD-10-CM | POA: Diagnosis not present

## 2022-12-18 DIAGNOSIS — Z85038 Personal history of other malignant neoplasm of large intestine: Secondary | ICD-10-CM | POA: Diagnosis not present

## 2022-12-18 LAB — COMPREHENSIVE METABOLIC PANEL
Albumin: 4 (ref 3.5–5.0)
Calcium: 9.8 (ref 8.7–10.7)

## 2022-12-18 LAB — BASIC METABOLIC PANEL
BUN: 21 (ref 4–21)
CO2: 27 — AB (ref 13–22)
Chloride: 104 (ref 99–108)
Creatinine: 1.4 — AB (ref 0.5–1.1)
Glucose: 161
Potassium: 4.1 mEq/L (ref 3.5–5.1)
Sodium: 138 (ref 137–147)

## 2022-12-18 LAB — CBC: RBC: 3.79 — AB (ref 3.87–5.11)

## 2022-12-18 LAB — CBC AND DIFFERENTIAL
HCT: 38 (ref 36–46)
Hemoglobin: 12.6 (ref 12.0–16.0)
Neutrophils Absolute: 3.07
Platelets: 52 10*3/uL — AB (ref 150–400)
WBC: 5.2

## 2022-12-18 LAB — HEPATIC FUNCTION PANEL
ALT: 17 U/L (ref 7–35)
AST: 28 (ref 13–35)
Alkaline Phosphatase: 195 — AB (ref 25–125)
Bilirubin, Total: 0.8

## 2022-12-20 NOTE — Progress Notes (Signed)
[facility]  [address] [place],  [place]  [address] [phone]  Clinic Day:  12/21/2022  Referring physician: Alinda Deem, MD  HISTORY OF PRESENT ILLNESS:  The patient is a 57 y.o. female with  metastatic colon cancer, which includes spread of disease to her abdominal cavity, lungs, brain and right adrenal gland.  She comes in today to go over her CT scans to ascertain her new disease baseline after 8 cycles of FOLFOX/Avastin.  The patient claims to have tolerated her 8th cycle of this chemotherapy regimen fairly well.  She did have weekly episodes of vomiting over these past 2 weeks.  However, these episodes were brief in nature.  She still has cold insensitivity with her lips and fingers.  However, her finger/hand functions have not been hampered to where she feels a dose reduction with her oxaliplatin is necessary.   She denies having any new GI symptoms or findings which concern her for overt signs of disease progression.   With respect to her colon cancer history, she is status post a right hemicolectomy in early October 2018, followed by 12 cycles of adjuvant FOLFOX chemotherapy, which were completed in April 2019.  In December 2021, she underwent a left cerebellar metastasectomy, whose pathology was consistent with metastatic colon cancer.  Tumor testing did come back MMR normal. CT scans revealed evidence of her cancer being in multiple locations, for which she took 40 cycles of FOLFIRI/Avastin.  As follow-up scans revealed liver metastasis, she was placed back on FOLFOX/Avastin.     PHYSICAL EXAM:  Blood pressure 123/79, pulse (!) 106, temperature 97.6 F (36.4 C), temperature source Oral, resp. rate 18, height 5\' 8"  (1.727 m), weight 152 lb 4.8 oz (69.1 kg), last menstrual period 02/22/2019, SpO2 97%. Body mass index is 23.16 kg/m.  Performance status (ECOG): 1 - Symptomatic but completely ambulatory  Physical Exam Vitals and nursing note  reviewed.  Constitutional:      General: She is not in acute distress.    Appearance: Normal appearance.  HENT:     Head: Normocephalic and atraumatic.     Mouth/Throat:     Mouth: Mucous membranes are moist.     Pharynx: Oropharynx is clear. No oropharyngeal exudate or posterior oropharyngeal erythema.  Eyes:     General: No scleral icterus.    Extraocular Movements: Extraocular movements intact.     Conjunctiva/sclera: Conjunctivae normal.     Pupils: Pupils are equal, round, and reactive to light.  Cardiovascular:     Rate and Rhythm: Normal rate and regular rhythm.     Heart sounds: Normal heart sounds. No murmur heard.    No friction rub. No gallop.  Pulmonary:     Effort: Pulmonary effort is normal.     Breath sounds: Normal breath sounds. No wheezing, rhonchi or rales.  Abdominal:     General: There is no distension.     Palpations: Abdomen is soft. There is no hepatomegaly, splenomegaly or mass.     Tenderness: There is no abdominal tenderness.  Musculoskeletal:        General: Normal range of motion.     Cervical back: Normal range of motion and neck supple. No tenderness.     Right lower leg: No edema.     Left lower leg: No edema.  Lymphadenopathy:     Cervical: No cervical adenopathy.     Upper Body:     Right upper body: No supraclavicular or axillary adenopathy.  Left upper body: No supraclavicular or axillary adenopathy.     Lower Body: No right inguinal adenopathy. No left inguinal adenopathy.  Skin:    General: Skin is warm and dry.     Coloration: Skin is not jaundiced.     Findings: No rash.  Neurological:     Mental Status: She is alert and oriented to person, place, and time.     Cranial Nerves: No cranial nerve deficit.  Psychiatric:        Mood and Affect: Mood normal.        Behavior: Behavior normal.        Thought Content: Thought content normal.   SCANS:  CT scans of her chest/abdomen/pelvis revealed the following: FINDINGS: CT CHEST  FINDINGS  Cardiovascular: Accessed right chest Port-A-Cath with tip at the superior cavoatrial junction. Aortic atherosclerosis. No central pulmonary embolus on this nondedicated study. Normal size heart. No significant pericardial effusion/thickening  Mediastinum/Nodes: No suspicious thyroid nodule. No pathologically enlarged mediastinal, hilar or axillary lymph nodes. The esophagus is grossly unremarkable  Lungs/Pleura: Dominant portion of the spiculated right upper lobe pulmonary nodule measures 2.4 x 1.6 cm on image 34/301 previously 3.1 x 1.6 cm.  Peripheral left lower lobe irregular nodule measures 13 x 13 mm on image 71/301 previously 19 x 11 mm.  Few additional scattered subcentimeter pulmonary nodules are not significantly changed from prior for instance a 5 mm pulmonary nodule in the lingula on image 67/301. No new suspicious pulmonary nodules or masses.  Musculoskeletal: No aggressive lytic or blastic lesion of bone. Thoracic spondylosis  CT ABDOMEN PELVIS FINDINGS  Hepatobiliary: Hypodense lesion in the caudate lobe measures 6 x 5 mm on image 52/2 previously 11 x 9 mm. Hypodensity along the anterior margin of the gallbladder fossa measures 9 mm on image 61/2 previously 11 mm. No new suspicious hepatic lesion.  Gallbladder is unremarkable no biliary ductal dilation  Pancreas: No pancreatic ductal dilation or evidence of acute inflammation  Spleen: No splenomegaly.  Adrenals/Urinary Tract: Bilateral adrenal glands appear normal. No hydronephrosis. Kidneys demonstrate symmetric enhancement. Urinary bladder is unremarkable for degree of distension.  Stomach/Bowel: Radiopaque enteric contrast material traverses the descending colon. Stomach is unremarkable for degree of distension no pathologic dilation of small or large bowel. Prior right hemicolectomy without suspicious enhancing nodularity along the suture line in the right hemiabdomen. Similar diffuse  wall thickening of a nondistended rectum.  Vascular/Lymphatic: Aortic atherosclerosis. Smooth IVC contours. The portal, splenic and superior mesenteric veins are patent. No pathologically enlarged abdominal or pelvic lymph nodes  Reproductive: Status post hysterectomy. No adnexal masses.  Other: No significant abdominopelvic free fluid. No discrete peritoneal or omental nodularity  Musculoskeletal: No aggressive lytic or blastic lesion of bone  IMPRESSION: 1. Slight interval decrease in size of the dominant right-sided pulmonary nodules and caudate lobe hepatic lesion. Additional pulmonary nodules and lesion along the gallbladder fossa are similar to prior. 2. No evidence of new or progressive disease in the chest, abdomen or pelvis 3. Similar diffuse wall thickening of a nondistended rectum, nonspecific and may be related to underdistention or proctitis. Consider correlation with direct visualization 4. Aortic Atherosclerosis (ICD10-I70.0).  LABS:       Latest Ref Rng & Units 12/18/2022   12:00 AM 12/07/2022    4:02 PM 11/09/2022   12:00 AM  CBC  WBC  5.2     3.5  4.1      Hemoglobin 12.0 - 16.0 12.6     11.9  11.7  Hematocrit 36 - 46 38     36.9  35      Platelets 150 - 400 K/uL 52     72  84         This result is from an external source.      Latest Ref Rng & Units 12/18/2022   12:00 AM 12/09/2022    3:12 PM 12/07/2022    4:02 PM  CMP  Glucose 70 - 99 mg/dL  865  784   BUN 4 - 21 21     29  16    Creatinine 0.5 - 1.1 1.4     1.68  1.43   Sodium 137 - 147 138     132  141   Potassium 3.5 - 5.1 mEq/L 4.1     4.3  3.9   Chloride 99 - 108 104     100  109   CO2 13 - 22 27     20  24    Calcium 8.7 - 10.7 9.8     8.3  8.9   Total Protein 6.5 - 8.1 g/dL  6.2  6.4   Total Bilirubin 0.3 - 1.2 mg/dL  0.6  0.6   Alkaline Phos 25 - 125 195     129  140   AST 13 - 35 28     27  22    ALT 7 - 35 U/L 17     21  23       This result is from an external source.       ASSESSMENT & PLAN:  Assessment/Plan:  A 57 y.o. female with metastatic colon cancer.  In clinic today, I went over all of her CT scan images with her, for which she could see the decrease in the size of her metastatic pulmonary nodules and metastatic liver lesion.  Furthermore, her CEA is lower.  Understandably, she was pleased with her CT scan images.  Based upon this, she will proceed with her 9th cycle of FOLFOX/Avastin this week.  Despite having some occasional side effects from her chemotherapy, she does not believe they are severe to where she needs to space out her chemotherapy.  She will continue to receive this regimen every 2 weeks.  I will see her back in 2 weeks before she heads into her 10th cycle of FOLFOX/Avastin.  The patient understands all the plans discussed today and is in agreement with them.  Iniya Matzek Kirby Funk, MD

## 2022-12-20 NOTE — Progress Notes (Incomplete)
[facility]  [address] [place],  [place]  [address] [phone]  Clinic Day:  12/07/2022  Referring physician: Alinda Deem, MD  HISTORY OF PRESENT ILLNESS:  The patient is a 57 y.o. female with  metastatic colon cancer, which includes spread of disease to her abdominal cavity, lungs, brain and right adrenal gland.  She comes in today to be evaluated before heading into her 8th cycle of FOLFOX/Avastin.  The patient claims to have tolerated her 7th cycle of this chemotherapy regimen fairly well.  She did have cold insensitivity with her lips and fingers.  However, her finger/hand functions have not been hampered to where she feels a dose reduction with her oxaliplatin is necessary.  She denies having any new GI symptoms or findings which concern her for overt signs of disease progression.   With respect to her colon cancer history, she is status post a right hemicolectomy in early October 2018, followed by 12 cycles of adjuvant FOLFOX chemotherapy, which were completed in April 2019.  In December 2021, she underwent a left cerebellar metastasectomy, whose pathology was consistent with metastatic colon cancer.  Tumor testing did come back MMR normal. CT scans revealed evidence of her cancer being in multiple locations, for which she took 40 cycles of FOLFIRI/Avastin.  As follow-up scans revealed liver metastasis, she was placed back on FOLFOX/Avastin.     PHYSICAL EXAM:  Last menstrual period 02/22/2019. There is no height or weight on file to calculate BMI.  Performance status (ECOG): 1 - Symptomatic but completely ambulatory  Physical Exam Vitals and nursing note reviewed.  Constitutional:      General: She is not in acute distress.    Appearance: Normal appearance.  HENT:     Head: Normocephalic and atraumatic.     Mouth/Throat:     Mouth: Mucous membranes are moist.     Pharynx: Oropharynx is clear. No oropharyngeal exudate or posterior  oropharyngeal erythema.  Eyes:     General: No scleral icterus.    Extraocular Movements: Extraocular movements intact.     Conjunctiva/sclera: Conjunctivae normal.     Pupils: Pupils are equal, round, and reactive to light.  Cardiovascular:     Rate and Rhythm: Normal rate and regular rhythm.     Heart sounds: Normal heart sounds. No murmur heard.    No friction rub. No gallop.  Pulmonary:     Effort: Pulmonary effort is normal.     Breath sounds: Normal breath sounds. No wheezing, rhonchi or rales.  Abdominal:     General: There is no distension.     Palpations: Abdomen is soft. There is no hepatomegaly, splenomegaly or mass.     Tenderness: There is no abdominal tenderness.  Musculoskeletal:        General: Normal range of motion.     Cervical back: Normal range of motion and neck supple. No tenderness.     Right lower leg: No edema.     Left lower leg: No edema.  Lymphadenopathy:     Cervical: No cervical adenopathy.     Upper Body:     Right upper body: No supraclavicular or axillary adenopathy.     Left upper body: No supraclavicular or axillary adenopathy.     Lower Body: No right inguinal adenopathy. No left inguinal adenopathy.  Skin:    General: Skin is warm and dry.     Coloration: Skin is not jaundiced.     Findings: No rash.  Neurological:  Mental Status: She is alert and oriented to person, place, and time.     Cranial Nerves: No cranial nerve deficit.  Psychiatric:        Mood and Affect: Mood normal.        Behavior: Behavior normal.        Thought Content: Thought content normal.   SCANS:  CT scans of her chest/abdomen/pelvis revealed the following: FINDINGS: CT CHEST FINDINGS  Cardiovascular: Accessed right chest Port-A-Cath with tip at the superior cavoatrial junction. Aortic atherosclerosis. No central pulmonary embolus on this nondedicated study. Normal size heart. No significant pericardial effusion/thickening  Mediastinum/Nodes: No suspicious  thyroid nodule. No pathologically enlarged mediastinal, hilar or axillary lymph nodes. The esophagus is grossly unremarkable  Lungs/Pleura: Dominant portion of the spiculated right upper lobe pulmonary nodule measures 2.4 x 1.6 cm on image 34/301 previously 3.1 x 1.6 cm.  Peripheral left lower lobe irregular nodule measures 13 x 13 mm on image 71/301 previously 19 x 11 mm.  Few additional scattered subcentimeter pulmonary nodules are not significantly changed from prior for instance a 5 mm pulmonary nodule in the lingula on image 67/301. No new suspicious pulmonary nodules or masses.  Musculoskeletal: No aggressive lytic or blastic lesion of bone. Thoracic spondylosis  CT ABDOMEN PELVIS FINDINGS  Hepatobiliary: Hypodense lesion in the caudate lobe measures 6 x 5 mm on image 52/2 previously 11 x 9 mm. Hypodensity along the anterior margin of the gallbladder fossa measures 9 mm on image 61/2 previously 11 mm. No new suspicious hepatic lesion.  Gallbladder is unremarkable no biliary ductal dilation  Pancreas: No pancreatic ductal dilation or evidence of acute inflammation  Spleen: No splenomegaly.  Adrenals/Urinary Tract: Bilateral adrenal glands appear normal. No hydronephrosis. Kidneys demonstrate symmetric enhancement. Urinary bladder is unremarkable for degree of distension.  Stomach/Bowel: Radiopaque enteric contrast material traverses the descending colon. Stomach is unremarkable for degree of distension no pathologic dilation of small or large bowel. Prior right hemicolectomy without suspicious enhancing nodularity along the suture line in the right hemiabdomen. Similar diffuse wall thickening of a nondistended rectum.  Vascular/Lymphatic: Aortic atherosclerosis. Smooth IVC contours. The portal, splenic and superior mesenteric veins are patent. No pathologically enlarged abdominal or pelvic lymph nodes  Reproductive: Status post hysterectomy. No adnexal  masses.  Other: No significant abdominopelvic free fluid. No discrete peritoneal or omental nodularity  Musculoskeletal: No aggressive lytic or blastic lesion of bone  IMPRESSION: 1. Slight interval decrease in size of the dominant right-sided pulmonary nodules and caudate lobe hepatic lesion. Additional pulmonary nodules and lesion along the gallbladder fossa are similar to prior. 2. No evidence of new or progressive disease in the chest, abdomen or pelvis 3. Similar diffuse wall thickening of a nondistended rectum, nonspecific and may be related to underdistention or proctitis. Consider correlation with direct visualization 4. Aortic Atherosclerosis (ICD10-I70.0).  LABS:       Latest Ref Rng & Units 12/07/2022    4:02 PM 11/09/2022   12:00 AM 10/27/2022   12:00 AM  CBC  WBC 4.0 - 10.5 K/uL 3.5  4.1     2.5      Hemoglobin 12.0 - 15.0 g/dL 29.5  28.4     13.2      Hematocrit 36.0 - 46.0 % 36.9  35     37      Platelets 150 - 400 K/uL 72  84     96         This result is from an external source.  Latest Ref Rng & Units 12/09/2022    3:12 PM 12/07/2022    4:02 PM 11/23/2022    3:49 PM  CMP  Glucose 70 - 99 mg/dL 161  096  045   BUN 6 - 20 mg/dL 29  16  17    Creatinine 0.44 - 1.00 mg/dL 4.09  8.11  9.14   Sodium 135 - 145 mmol/L 132  141  139   Potassium 3.5 - 5.1 mmol/L 4.3  3.9  3.6   Chloride 98 - 111 mmol/L 100  109  109   CO2 22 - 32 mmol/L 20  24  25    Calcium 8.9 - 10.3 mg/dL 8.3  8.9  8.6   Total Protein 6.5 - 8.1 g/dL 6.2  6.4  6.1   Total Bilirubin 0.3 - 1.2 mg/dL 0.6  0.6  0.6   Alkaline Phos 38 - 126 U/L 129  140  128   AST 15 - 41 U/L 27  22  27    ALT 0 - 44 U/L 21  23  22        ASSESSMENT & PLAN:  Assessment/Plan:  A 57 y.o. female with metastatic colon cancer.  He will proceed with her 8th cycle of FOLFOX/Avastin this week.  As she is mildly neutropenic, she will receive a few doses of Zarxio to prevent severe neutropenia from either delaying future  cycles of treatment or causing a spontaneous infection.  Clinically, the patient appears to be doing very well.  I will see her back in 2 weeks before she heads into a potential 9th cycle of FOLFOX/Avastin.  CT scans of her chest/abdomen/pelvis will be done today before her next visit to ascertain her new disease baseline after 8 cycles of FOLFOX/Avastin.  The patient understands all the plans discussed today and is in agreement with them.  Maxim Bedel Kirby Funk, MD

## 2022-12-21 ENCOUNTER — Encounter: Payer: Self-pay | Admitting: Oncology

## 2022-12-21 ENCOUNTER — Inpatient Hospital Stay (HOSPITAL_BASED_OUTPATIENT_CLINIC_OR_DEPARTMENT_OTHER): Payer: 59 | Admitting: Oncology

## 2022-12-21 DIAGNOSIS — C189 Malignant neoplasm of colon, unspecified: Secondary | ICD-10-CM

## 2022-12-21 DIAGNOSIS — C787 Secondary malignant neoplasm of liver and intrahepatic bile duct: Secondary | ICD-10-CM | POA: Diagnosis not present

## 2022-12-21 DIAGNOSIS — C7800 Secondary malignant neoplasm of unspecified lung: Secondary | ICD-10-CM | POA: Diagnosis not present

## 2022-12-21 DIAGNOSIS — C182 Malignant neoplasm of ascending colon: Secondary | ICD-10-CM

## 2022-12-21 LAB — CEA: CEA: 11.2

## 2022-12-22 ENCOUNTER — Other Ambulatory Visit: Payer: Self-pay | Admitting: Pharmacist

## 2022-12-22 ENCOUNTER — Encounter: Payer: Self-pay | Admitting: Oncology

## 2022-12-22 ENCOUNTER — Inpatient Hospital Stay: Payer: 59

## 2022-12-22 DIAGNOSIS — C182 Malignant neoplasm of ascending colon: Secondary | ICD-10-CM | POA: Diagnosis not present

## 2022-12-22 DIAGNOSIS — C189 Malignant neoplasm of colon, unspecified: Secondary | ICD-10-CM

## 2022-12-22 LAB — CBC AND DIFFERENTIAL
HCT: 34 — AB (ref 36–46)
Hemoglobin: 11.5 — AB (ref 12.0–16.0)
Neutrophils Absolute: 1.12
Platelets: 63 10*3/uL — AB (ref 150–400)
WBC: 2.6

## 2022-12-22 LAB — CBC: RBC: 3.45 — AB (ref 3.87–5.11)

## 2022-12-22 MED FILL — Fluorouracil IV Soln 2.5 GM/50ML (50 MG/ML): INTRAVENOUS | Qty: 14 | Status: AC

## 2022-12-22 MED FILL — Fosaprepitant Dimeglumine For IV Infusion 150 MG (Base Eq): INTRAVENOUS | Qty: 5 | Status: AC

## 2022-12-22 MED FILL — Dexamethasone Sodium Phosphate Inj 100 MG/10ML: INTRAMUSCULAR | Qty: 1 | Status: AC

## 2022-12-22 NOTE — Progress Notes (Signed)
Ok to proceed with chemo on 7/24 despite plt = 63K and ANC=1120 per Dr. Melvyn Neth.

## 2022-12-23 ENCOUNTER — Encounter: Payer: Self-pay | Admitting: Oncology

## 2022-12-23 ENCOUNTER — Inpatient Hospital Stay: Payer: 59

## 2022-12-23 VITALS — BP 138/82 | HR 98 | Temp 98.3°F | Resp 20 | Wt 151.1 lb

## 2022-12-23 DIAGNOSIS — C7971 Secondary malignant neoplasm of right adrenal gland: Secondary | ICD-10-CM | POA: Diagnosis not present

## 2022-12-23 DIAGNOSIS — C189 Malignant neoplasm of colon, unspecified: Secondary | ICD-10-CM | POA: Diagnosis not present

## 2022-12-23 DIAGNOSIS — C182 Malignant neoplasm of ascending colon: Secondary | ICD-10-CM

## 2022-12-23 DIAGNOSIS — C7931 Secondary malignant neoplasm of brain: Secondary | ICD-10-CM | POA: Diagnosis not present

## 2022-12-23 DIAGNOSIS — C7802 Secondary malignant neoplasm of left lung: Secondary | ICD-10-CM | POA: Diagnosis not present

## 2022-12-23 DIAGNOSIS — C786 Secondary malignant neoplasm of retroperitoneum and peritoneum: Secondary | ICD-10-CM | POA: Diagnosis not present

## 2022-12-23 DIAGNOSIS — C787 Secondary malignant neoplasm of liver and intrahepatic bile duct: Secondary | ICD-10-CM | POA: Diagnosis not present

## 2022-12-23 DIAGNOSIS — C7801 Secondary malignant neoplasm of right lung: Secondary | ICD-10-CM | POA: Diagnosis not present

## 2022-12-23 DIAGNOSIS — Z5111 Encounter for antineoplastic chemotherapy: Secondary | ICD-10-CM | POA: Diagnosis not present

## 2022-12-23 DIAGNOSIS — Z5189 Encounter for other specified aftercare: Secondary | ICD-10-CM | POA: Diagnosis not present

## 2022-12-23 MED ORDER — FLUOROURACIL CHEMO INJECTION 2.5 GM/50ML
400.0000 mg/m2 | Freq: Once | INTRAVENOUS | Status: AC
  Administered 2022-12-23: 700 mg via INTRAVENOUS
  Filled 2022-12-23: qty 14

## 2022-12-23 MED ORDER — PALONOSETRON HCL INJECTION 0.25 MG/5ML
0.2500 mg | Freq: Once | INTRAVENOUS | Status: AC
  Administered 2022-12-23: 0.25 mg via INTRAVENOUS
  Filled 2022-12-23: qty 5

## 2022-12-23 MED ORDER — HEPARIN SOD (PORK) LOCK FLUSH 100 UNIT/ML IV SOLN: 500.0000 [IU] | Freq: Once | INTRAVENOUS | Status: DC | PRN

## 2022-12-23 MED ORDER — SODIUM CHLORIDE 0.9% FLUSH: 10.0000 mL | INTRAVENOUS | Status: DC | PRN

## 2022-12-23 MED ORDER — SODIUM CHLORIDE 0.9 % IV SOLN
5.0000 mg/kg | Freq: Once | INTRAVENOUS | Status: AC
  Administered 2022-12-23: 350 mg via INTRAVENOUS
  Filled 2022-12-23: qty 14

## 2022-12-23 MED ORDER — FAMOTIDINE IN NACL 20-0.9 MG/50ML-% IV SOLN
20.0000 mg | Freq: Once | INTRAVENOUS | Status: AC
  Administered 2022-12-23: 20 mg via INTRAVENOUS
  Filled 2022-12-23: qty 50

## 2022-12-23 MED ORDER — DEXTROSE 5 % IV SOLN: Freq: Once | INTRAVENOUS | Status: AC

## 2022-12-23 MED ORDER — SODIUM CHLORIDE 0.9 % IV SOLN
150.0000 mg | Freq: Once | INTRAVENOUS | Status: AC
  Administered 2022-12-23: 150 mg via INTRAVENOUS
  Filled 2022-12-23: qty 150

## 2022-12-23 MED ORDER — SODIUM CHLORIDE 0.9 % IV SOLN
10.0000 mg | Freq: Once | INTRAVENOUS | Status: AC
  Administered 2022-12-23: 10 mg via INTRAVENOUS
  Filled 2022-12-23: qty 10

## 2022-12-23 MED ORDER — OXALIPLATIN CHEMO INJECTION 100 MG/20ML
85.0000 mg/m2 | Freq: Once | INTRAVENOUS | Status: AC
  Administered 2022-12-23: 150 mg via INTRAVENOUS
  Filled 2022-12-23: qty 20

## 2022-12-23 MED ORDER — DIPHENHYDRAMINE HCL 50 MG/ML IJ SOLN
25.0000 mg | Freq: Once | INTRAMUSCULAR | Status: AC
  Administered 2022-12-23: 25 mg via INTRAVENOUS
  Filled 2022-12-23: qty 1

## 2022-12-23 MED ORDER — SODIUM CHLORIDE 0.9 % IV SOLN
2400.0000 mg/m2 | INTRAVENOUS | Status: DC
  Administered 2022-12-23: 4350 mg via INTRAVENOUS
  Filled 2022-12-23: qty 87

## 2022-12-23 MED ORDER — SODIUM CHLORIDE 0.9 % IV SOLN: Freq: Once | INTRAVENOUS | Status: AC

## 2022-12-23 NOTE — Patient Instructions (Signed)
Bevacizumab Injection What is this medication? BEVACIZUMAB (be va SIZ yoo mab) treats some types of cancer. It works by blocking a protein that causes cancer cells to grow and multiply. This helps to slow or stop the spread of cancer cells. It is a monoclonal antibody. This medicine may be used for other purposes; ask your health care provider or pharmacist if you have questions. COMMON BRAND NAME(S): Alymsys, Avastin, MVASI, Omer Jack What should I tell my care team before I take this medication? They need to know if you have any of these conditions: Blood clots Coughing up blood Having or recent surgery Heart failure High blood pressure History of a connection between 2 or more body parts that do not usually connect (fistula) History of a tear in your stomach or intestines Protein in your urine An unusual or allergic reaction to bevacizumab, other medications, foods, dyes, or preservatives Pregnant or trying to get pregnant Breast-feeding How should I use this medication? This medication is injected into a vein. It is given by your care team in a hospital or clinic setting. Talk to your care team the use of this medication in children. Special care may be needed. Overdosage: If you think you have taken too much of this medicine contact a poison control center or emergency room at once. NOTE: This medicine is only for you. Do not share this medicine with others. What if I miss a dose? Keep appointments for follow-up doses. It is important not to miss your dose. Call your care team if you are unable to keep an appointment. What may interact with this medication? Interactions are not expected. This list may not describe all possible interactions. Give your health care provider a list of all the medicines, herbs, non-prescription drugs, or dietary supplements you use. Also tell them if you smoke, drink alcohol, or use illegal drugs. Some items may interact with your medicine. What should I  watch for while using this medication? Your condition will be monitored carefully while you are receiving this medication. You may need blood work while taking this medication. This medication may make you feel generally unwell. This is not uncommon as chemotherapy can affect healthy cells as well as cancer cells. Report any side effects. Continue your course of treatment even though you feel ill unless your care team tells you to stop. This medication may increase your risk to bruise or bleed. Call your care team if you notice any unusual bleeding. Before having surgery, talk to your care team to make sure it is ok. This medication can increase the risk of poor healing of your surgical site or wound. You will need to stop this medication for 28 days before surgery. After surgery, wait at least 28 days before restarting this medication. Make sure the surgical site or wound is healed enough before restarting this medication. Talk to your care team if questions. Talk to your care team if you may be pregnant. Serious birth defects can occur if you take this medication during pregnancy and for 6 months after the last dose. Contraception is recommended while taking this medication and for 6 months after the last dose. Your care team can help you find the option that works for you. Do not breastfeed while taking this medication and for 6 months after the last dose. This medication can cause infertility. Talk to your care team if you are concerned about your fertility. What side effects may I notice from receiving this medication? Side effects that you should report  to your care team as soon as possible: Allergic reactions--skin rash, itching, hives, swelling of the face, lips, tongue, or throat Bleeding--bloody or black, tar-like stools, vomiting blood or brown material that looks like coffee grounds, red or dark brown urine, small red or purple spots on skin, unusual bruising or bleeding Blood clot--pain,  swelling, or warmth in the leg, shortness of breath, chest pain Heart attack--pain or tightness in the chest, shoulders, arms, or jaw, nausea, shortness of breath, cold or clammy skin, feeling faint or lightheaded Heart failure--shortness of breath, swelling of the ankles, feet, or hands, sudden weight gain, unusual weakness or fatigue Increase in blood pressure Infection--fever, chills, cough, sore throat, wounds that don't heal, pain or trouble when passing urine, general feeling of discomfort or being unwell Infusion reactions--chest pain, shortness of breath or trouble breathing, feeling faint or lightheaded Kidney injury--decrease in the amount of urine, swelling of the ankles, hands, or feet Stomach pain that is severe, does not go away, or gets worse Stroke--sudden numbness or weakness of the face, arm, or leg, trouble speaking, confusion, trouble walking, loss of balance or coordination, dizziness, severe headache, change in vision Sudden and severe headache, confusion, change in vision, seizures, which may be signs of posterior reversible encephalopathy syndrome (PRES) Side effects that usually do not require medical attention (report to your care team if they continue or are bothersome): Back pain Change in taste Diarrhea Dry skin Increased tears Nosebleed This list may not describe all possible side effects. Call your doctor for medical advice about side effects. You may report side effects to FDA at 1-800-FDA-1088. Where should I keep my medication? This medication is given in a hospital or clinic. It will not be stored at home. NOTE: This sheet is a summary. It may not cover all possible information. If you have questions about this medicine, talk to your doctor, pharmacist, or health care provider.  2024 Else[employer]/Gold Standard (2021-10-03 00:00:00) Fluorouracil Injection What is this medication? FLUOROURACIL (flure oh YOOR a sil) treats some types of cancer. It works by slowing  down the growth of cancer cells. This medicine may be used for other purposes; ask your health care provider or pharmacist if you have questions. COMMON BRAND NAME(S): Adrucil What should I tell my care team before I take this medication? They need to know if you have any of these conditions: Blood disorders Dihydropyrimidine dehydrogenase (DPD) deficiency Infection, such as chickenpox, cold sores, herpes Kidney disease Liver disease Poor nutrition Recent or ongoing radiation therapy An unusual or allergic reaction to fluorouracil, other medications, foods, dyes, or preservatives If you or your partner are pregnant or trying to get pregnant Breast-feeding How should I use this medication? This medication is injected into a vein. It is administered by your care team in a hospital or clinic setting. Talk to your care team about the use of this medication in children. Special care may be needed. Overdosage: If you think you have taken too much of this medicine contact a poison control center or emergency room at once. NOTE: This medicine is only for you. Do not share this medicine with others. What if I miss a dose? Keep appointments for follow-up doses. It is important not to miss your dose. Call your care team if you are unable to keep an appointment. What may interact with this medication? Do not take this medication with any of the following: Live virus vaccines This medication may also interact with the following: Medications that treat or prevent blood  clots, such as warfarin, enoxaparin, dalteparin This list may not describe all possible interactions. Give your health care provider a list of all the medicines, herbs, non-prescription drugs, or dietary supplements you use. Also tell them if you smoke, drink alcohol, or use illegal drugs. Some items may interact with your medicine. What should I watch for while using this medication? Your condition will be monitored carefully while you  are receiving this medication. This medication may make you feel generally unwell. This is not uncommon as chemotherapy can affect healthy cells as well as cancer cells. Report any side effects. Continue your course of treatment even though you feel ill unless your care team tells you to stop. In some cases, you may be given additional medications to help with side effects. Follow all directions for their use. This medication may increase your risk of getting an infection. Call your care team for advice if you get a fever, chills, sore throat, or other symptoms of a cold or flu. Do not treat yourself. Try to avoid being around people who are sick. This medication may increase your risk to bruise or bleed. Call your care team if you notice any unusual bleeding. Be careful brushing or flossing your teeth or using a toothpick because you may get an infection or bleed more easily. If you have any dental work done, tell your dentist you are receiving this medication. Avoid taking medications that contain aspirin, acetaminophen, ibuprofen, naproxen, or ketoprofen unless instructed by your care team. These medications may hide a fever. Do not treat diarrhea with over the counter products. Contact your care team if you have diarrhea that lasts more than 2 days or if it is severe and watery. This medication can make you more sensitive to the sun. Keep out of the sun. If you cannot avoid being in the sun, wear protective clothing and sunscreen. Do not use sun lamps, tanning beds, or tanning booths. Talk to your care team if you or your partner wish to become pregnant or think you might be pregnant. This medication can cause serious birth defects if taken during pregnancy and for 3 months after the last dose. A reliable form of contraception is recommended while taking this medication and for 3 months after the last dose. Talk to your care team about effective forms of contraception. Do not father a child while taking  this medication and for 3 months after the last dose. Use a condom while having sex during this time period. Do not breastfeed while taking this medication. This medication may cause infertility. Talk to your care team if you are concerned about your fertility. What side effects may I notice from receiving this medication? Side effects that you should report to your care team as soon as possible: Allergic reactions--skin rash, itching, hives, swelling of the face, lips, tongue, or throat Heart attack--pain or tightness in the chest, shoulders, arms, or jaw, nausea, shortness of breath, cold or clammy skin, feeling faint or lightheaded Heart failure--shortness of breath, swelling of the ankles, feet, or hands, sudden weight gain, unusual weakness or fatigue Heart rhythm changes--fast or irregular heartbeat, dizziness, feeling faint or lightheaded, chest pain, trouble breathing High ammonia level--unusual weakness or fatigue, confusion, loss of appetite, nausea, vomiting, seizures Infection--fever, chills, cough, sore throat, wounds that don't heal, pain or trouble when passing urine, general feeling of discomfort or being unwell Low red blood cell level--unusual weakness or fatigue, dizziness, headache, trouble breathing Pain, tingling, or numbness in the hands or feet,  muscle weakness, change in vision, confusion or trouble speaking, loss of balance or coordination, trouble walking, seizures Redness, swelling, and blistering of the skin over hands and feet Severe or prolonged diarrhea Unusual bruising or bleeding Side effects that usually do not require medical attention (report to your care team if they continue or are bothersome): Dry skin Headache Increased tears Nausea Pain, redness, or swelling with sores inside the mouth or throat Sensitivity to light Vomiting This list may not describe all possible side effects. Call your doctor for medical advice about side effects. You may report  side effects to FDA at 1-800-FDA-1088. Where should I keep my medication? This medication is given in a hospital or clinic. It will not be stored at home. NOTE: This sheet is a summary. It may not cover all possible information. If you have questions about this medicine, talk to your doctor, pharmacist, or health care provider.  2024 [employer]/Gold Standard (2021-09-23 00:00:00) Oxaliplatin Injection What is this medication? OXALIPLATIN (ox AL i PLA tin) treats colorectal cancer. It works by slowing down the growth of cancer cells. This medicine may be used for other purposes; ask your health care provider or pharmacist if you have questions. COMMON BRAND NAME(S): Eloxatin What should I tell my care team before I take this medication? They need to know if you have any of these conditions: Heart disease History of irregular heartbeat or rhythm Liver disease Low blood cell levels (white cells, red cells, and platelets) Lung or breathing disease, such as asthma Take medications that treat or prevent blood clots Tingling of the fingers, toes, or other nerve disorder An unusual or allergic reaction to oxaliplatin, other medications, foods, dyes, or preservatives If you or your partner are pregnant or trying to get pregnant Breast-feeding How should I use this medication? This medication is injected into a vein. It is given by your care team in a hospital or clinic setting. Talk to your care team about the use of this medication in children. Special care may be needed. Overdosage: If you think you have taken too much of this medicine contact a poison control center or emergency room at once. NOTE: This medicine is only for you. Do not share this medicine with others. What if I miss a dose? Keep appointments for follow-up doses. It is important not to miss a dose. Call your care team if you are unable to keep an appointment. What may interact with this medication? Do not take this medication  with any of the following: Cisapride Dronedarone Pimozide Thioridazine This medication may also interact with the following: Aspirin and aspirin-like medications Certain medications that treat or prevent blood clots, such as warfarin, apixaban, dabigatran, and rivaroxaban Cisplatin Cyclosporine Diuretics Medications for infection, such as acyclovir, adefovir, amphotericin B, bacitracin, cidofovir, foscarnet, ganciclovir, gentamicin, pentamidine, vancomycin NSAIDs, medications for pain and inflammation, such as ibuprofen or naproxen Other medications that cause heart rhythm changes Pamidronate Zoledronic acid This list may not describe all possible interactions. Give your health care provider a list of all the medicines, herbs, non-prescription drugs, or dietary supplements you use. Also tell them if you smoke, drink alcohol, or use illegal drugs. Some items may interact with your medicine. What should I watch for while using this medication? Your condition will be monitored carefully while you are receiving this medication. You may need blood work while taking this medication. This medication may make you feel generally unwell. This is not uncommon as chemotherapy can affect healthy cells as well as cancer  cells. Report any side effects. Continue your course of treatment even though you feel ill unless your care team tells you to stop. This medication may increase your risk of getting an infection. Call your care team for advice if you get a fever, chills, sore throat, or other symptoms of a cold or flu. Do not treat yourself. Try to avoid being around people who are sick. Avoid taking medications that contain aspirin, acetaminophen, ibuprofen, naproxen, or ketoprofen unless instructed by your care team. These medications may hide a fever. Be careful brushing or flossing your teeth or using a toothpick because you may get an infection or bleed more easily. If you have any dental work done,  tell your dentist you are receiving this medication. This medication can make you more sensitive to cold. Do not drink cold drinks or use ice. Cover exposed skin before coming in contact with cold temperatures or cold objects. When out in cold weather wear warm clothing and cover your mouth and nose to warm the air that goes into your lungs. Tell your care team if you get sensitive to the cold. Talk to your care team if you or your partner are pregnant or think either of you might be pregnant. This medication can cause serious birth defects if taken during pregnancy and for 9 months after the last dose. A negative pregnancy test is required before starting this medication. A reliable form of contraception is recommended while taking this medication and for 9 months after the last dose. Talk to your care team about effective forms of contraception. Do not father a child while taking this medication and for 6 months after the last dose. Use a condom while having sex during this time period. Do not breastfeed while taking this medication and for 3 months after the last dose. This medication may cause infertility. Talk to your care team if you are concerned about your fertility. What side effects may I notice from receiving this medication? Side effects that you should report to your care team as soon as possible: Allergic reactions--skin rash, itching, hives, swelling of the face, lips, tongue, or throat Bleeding--bloody or black, tar-like stools, vomiting blood or brown material that looks like coffee grounds, red or dark brown urine, small red or purple spots on skin, unusual bruising or bleeding Dry cough, shortness of breath or trouble breathing Heart rhythm changes--fast or irregular heartbeat, dizziness, feeling faint or lightheaded, chest pain, trouble breathing Infection--fever, chills, cough, sore throat, wounds that don't heal, pain or trouble when passing urine, general feeling of discomfort or  being unwell Liver injury--right upper belly pain, loss of appetite, nausea, light-colored stool, dark yellow or brown urine, yellowing skin or eyes, unusual weakness or fatigue Low red blood cell level--unusual weakness or fatigue, dizziness, headache, trouble breathing Muscle injury--unusual weakness or fatigue, muscle pain, dark yellow or brown urine, decrease in amount of urine Pain, tingling, or numbness in the hands or feet Sudden and severe headache, confusion, change in vision, seizures, which may be signs of posterior reversible encephalopathy syndrome (PRES) Unusual bruising or bleeding Side effects that usually do not require medical attention (report to your care team if they continue or are bothersome): Diarrhea Nausea Pain, redness, or swelling with sores inside the mouth or throat Unusual weakness or fatigue Vomiting This list may not describe all possible side effects. Call your doctor for medical advice about side effects. You may report side effects to FDA at 1-800-FDA-1088. Where should I keep my medication? This  medication is given in a hospital or clinic. It will not be stored at home. NOTE: This sheet is a summary. It may not cover all possible information. If you have questions about this medicine, talk to your doctor, pharmacist, or health care provider.  2024 [employer] Standard (2022-03-03 00:00:00)

## 2022-12-23 NOTE — Progress Notes (Signed)
Report to courtney spangler, rn

## 2022-12-25 ENCOUNTER — Inpatient Hospital Stay: Payer: 59

## 2022-12-25 VITALS — BP 121/71 | HR 89 | Temp 98.1°F | Resp 18 | Ht 68.0 in | Wt 151.0 lb

## 2022-12-25 DIAGNOSIS — Z5189 Encounter for other specified aftercare: Secondary | ICD-10-CM | POA: Diagnosis not present

## 2022-12-25 DIAGNOSIS — C7931 Secondary malignant neoplasm of brain: Secondary | ICD-10-CM | POA: Diagnosis not present

## 2022-12-25 DIAGNOSIS — C189 Malignant neoplasm of colon, unspecified: Secondary | ICD-10-CM

## 2022-12-25 DIAGNOSIS — C7802 Secondary malignant neoplasm of left lung: Secondary | ICD-10-CM | POA: Diagnosis not present

## 2022-12-25 DIAGNOSIS — Z5111 Encounter for antineoplastic chemotherapy: Secondary | ICD-10-CM | POA: Diagnosis not present

## 2022-12-25 DIAGNOSIS — C182 Malignant neoplasm of ascending colon: Secondary | ICD-10-CM

## 2022-12-25 DIAGNOSIS — C786 Secondary malignant neoplasm of retroperitoneum and peritoneum: Secondary | ICD-10-CM | POA: Diagnosis not present

## 2022-12-25 DIAGNOSIS — C7800 Secondary malignant neoplasm of unspecified lung: Secondary | ICD-10-CM

## 2022-12-25 DIAGNOSIS — C7971 Secondary malignant neoplasm of right adrenal gland: Secondary | ICD-10-CM | POA: Diagnosis not present

## 2022-12-25 DIAGNOSIS — C787 Secondary malignant neoplasm of liver and intrahepatic bile duct: Secondary | ICD-10-CM | POA: Diagnosis not present

## 2022-12-25 DIAGNOSIS — C7801 Secondary malignant neoplasm of right lung: Secondary | ICD-10-CM | POA: Diagnosis not present

## 2022-12-25 MED ORDER — HEPARIN SOD (PORK) LOCK FLUSH 100 UNIT/ML IV SOLN
500.0000 [IU] | Freq: Once | INTRAVENOUS | Status: AC | PRN
  Administered 2022-12-25: 500 [IU]

## 2022-12-25 MED ORDER — SODIUM CHLORIDE 0.9% FLUSH
10.0000 mL | INTRAVENOUS | Status: DC | PRN
  Administered 2022-12-25: 10 mL

## 2022-12-25 NOTE — Progress Notes (Unsigned)
[facility]  [address] [place],  [place]  [address] [phone]  Clinic Day:  01/04/2023  Referring physician: Alinda Deem, MD   HISTORY OF PRESENT ILLNESS:  The patient is a 57 y.o. female with metastatic colon cancer, which includes spread of disease to her abdominal cavity, lungs, brain and right adrenal gland.  CT imaging after 8 cycles of FOLFOX/Avastin revealed a decrease in the pulmonary nodules and metastatic liver lesion.  She is here today prior to a 10th cycle of FOLFOX/Avastin.  The patient claims to have tolerated her 9th cycle fairly well.  She did have 1 day of vomiting and reports diarrhea 4-6 times a day.  She has had to get IV fluids.  She is only taking Imodium occasionally as she does not wish to get constipated.  She denies having any new GI symptoms or findings which concern her for overt signs of disease progression. She still has cold insensitivity with her lips and fingers.  However, her finger/hand functions have not been hampered to where she feels a dose reduction with her oxaliplatin is necessary.  She has had cytopenias secondary to treatment.  She reports easy bruising and occasional slight nosebleeds.  She denies other overt form of blood loss.  She reports shortness of breath with any exertion.  She denies palpitations, cough or chest pain.  She will be out of town next week.  With respect to her colon cancer history, she is status post a right hemicolectomy in early October 2018, followed by 12 cycles of adjuvant FOLFOX chemotherapy, which were completed in April 2019.  In December 2021, she underwent a left cerebellar metastasectomy, whose pathology was consistent with metastatic colon cancer.  Tumor testing did come back MMR normal. CT scans revealed evidence of her cancer being in multiple locations, for which she took 40 cycles of FOLFIRI/Avastin.  As follow-up scans revealed liver metastasis, she was placed back on  FOLFOX/Avastin.        PHYSICAL EXAM:  Blood pressure 120/73, pulse 94, temperature 98.1 F (36.7 C), temperature source Oral, resp. rate 18, height 5\' 8"  (1.727 m), weight 152 lb 9.6 oz (69.2 kg), last menstrual period 02/22/2019, SpO2 100%. Wt Readings from Last 3 Encounters:  01/04/23 152 lb 9.6 oz (69.2 kg)  12/31/22 152 lb (68.9 kg)  12/25/22 151 lb (68.5 kg)   Body mass index is 23.2 kg/m.  Performance status (ECOG): 1 - Symptomatic but completely ambulatory  Physical Exam Vitals and nursing note reviewed.  Constitutional:      General: She is not in acute distress.    Appearance: Normal appearance.  HENT:     Head: Normocephalic and atraumatic.     Mouth/Throat:     Mouth: Mucous membranes are moist.     Pharynx: Oropharynx is clear. No oropharyngeal exudate or posterior oropharyngeal erythema.  Eyes:     General: No scleral icterus.    Extraocular Movements: Extraocular movements intact.     Conjunctiva/sclera: Conjunctivae normal.     Pupils: Pupils are equal, round, and reactive to light.  Cardiovascular:     Rate and Rhythm: Normal rate and regular rhythm.     Heart sounds: Normal heart sounds. No murmur heard.    No friction rub. No gallop.  Pulmonary:     Effort: Pulmonary effort is normal.     Breath sounds: Normal breath sounds. No wheezing, rhonchi or rales.  Abdominal:     General: There is no distension.  Palpations: Abdomen is soft. There is no hepatomegaly, splenomegaly or mass.     Tenderness: There is no abdominal tenderness.  Musculoskeletal:        General: Normal range of motion.     Cervical back: Normal range of motion and neck supple. No tenderness.     Right lower leg: No edema.     Left lower leg: No edema.  Lymphadenopathy:     Cervical: No cervical adenopathy.     Upper Body:     Right upper body: No supraclavicular or axillary adenopathy.     Left upper body: No supraclavicular or axillary adenopathy.     Lower Body: No right  inguinal adenopathy. No left inguinal adenopathy.  Skin:    General: Skin is warm and dry.     Coloration: Skin is not jaundiced.     Findings: No rash.  Neurological:     Mental Status: She is alert and oriented to person, place, and time.     Cranial Nerves: No cranial nerve deficit.  Psychiatric:        Mood and Affect: Mood normal.        Behavior: Behavior normal.        Thought Content: Thought content normal.     LABS:      Latest Ref Rng & Units 01/04/2023   12:00 AM 12/22/2022   12:00 AM 12/18/2022   12:00 AM  CBC  WBC  1.9     2.6     5.2      Hemoglobin 12.0 - 16.0 11.4     11.5     12.6      Hematocrit 36 - 46 33     34     38      Platelets 150 - 400 K/uL 42     63     52         This result is from an external source.      Latest Ref Rng & Units 01/04/2023   12:00 AM 12/18/2022   12:00 AM 12/09/2022    3:12 PM  CMP  Glucose 70 - 99 mg/dL   295   BUN 4 - 21 13     21     29    Creatinine 0.5 - 1.1 1.2     1.4     1.68   Sodium 137 - 147 140     138     132   Potassium 3.5 - 5.1 mEq/L 3.3     4.1     4.3   Chloride 99 - 108 106     104     100   CO2 13 - 22 22     27     20    Calcium 8.7 - 10.7 9.2     9.8     8.3   Total Protein 6.5 - 8.1 g/dL   6.2   Total Bilirubin 0.3 - 1.2 mg/dL   0.6   Alkaline Phos 25 - 125 144     195     129   AST 13 - 35 27     28     27    ALT 7 - 35 U/L 18     17     21       This result is from an external source.     Lab Results  Component Value Date   CEA1 14.1 (H) 12/07/2022   CEA1 14.2 (  H) 11/09/2022   CEA 11.2 12/18/2022   CEA 17.2 (A) 10/22/2022   CEA 17.2 10/22/2022   /  CEA  Date Value Ref Range Status  12/18/2022 11.2  Final  12/07/2022 14.1 (H) 0.0 - 4.7 ng/mL Final    Comment:    (NOTE)                             Nonsmokers          <3.9                             Smokers             <5.6 [employer] Electrochemiluminescence Immunoassay (ECLIA) Values obtained with different assay methods or  kits cannot be used interchangeably.  Results cannot be interpreted as absolute evidence of the presence or absence of malignant disease. Performed At: [facility] [address] [place], [place] [id number] Jolene Schimke MD JX:[id number]    No results found for: "PSA1" No results found for: "CAN199" No results found for: "CAN125"  No results found for: "TOTALPROTELP", "ALBUMINELP", "A1GS", "A2GS", "BETS", "BETA2SER", "GAMS", "MSPIKE", "SPEI"  Lab Results  Component Value Date   TIBC 476 (H) 04/29/2020   FERRITIN 17 04/29/2020   FERRITIN 7.3 (L) 11/21/2019   IRONPCTSAT 15 04/29/2020   IRONPCTSAT 8.5 (L) 11/21/2019   No results found for: "LDH"     Component Value Date/Time   CEA1 14.1 (H) 12/07/2022 1602   CEA 11.2 12/18/2022 0000    Review Flowsheet  More data exists      Latest Ref Rng & Units 11/09/2022 12/07/2022 12/18/2022  Oncology Labs  CEA 0.0 - 4.7 ng/mL 14.2  14.1  -  CEA ([facility]) - - - 11.2        Details       This result is from an external source.        D-dimer is elevated at 2.36  STUDIES:  No results found.    ASSESSMENT & PLAN:   Assessment/Plan:  57 y.o. female with metastatic colon cancer, which includes spread of disease to her abdominal cavity, lungs, brain and right adrenal gland.  Overall, she is doing fairly well.  She has worsening neutropenia and thrombocytopenia to the point that I do not feel comfortable treating her. We will give her filgrastim this week due to the severe neutropenia and she will be out of town next week. Her potassium is low, so will give her IV potassium this week.  I advised her to control the diarrhea by taking Imodium after 2 diarrhea stools, rather than waiting.  She reports shortness of breath with any exertion.  D-dimer is elevated, so I will obtain a CTA chest to evaluate for pulmonary embolism.  We will plan to see her back in 2 weeks for repeat clinical assessment prior to her 10th.  The patient  understands all the plans discussed today and is in agreement with them.  She knows to contact our office if she develops concerns prior to her next appointment.     Adah Perl, PA-C   Physician Assistant [facility] [place] [phone]

## 2022-12-25 NOTE — Patient Instructions (Signed)
Bevacizumab Injection What is this medication? BEVACIZUMAB (be va SIZ yoo mab) treats some types of cancer. It works by blocking a protein that causes cancer cells to grow and multiply. This helps to slow or stop the spread of cancer cells. It is a monoclonal antibody. This medicine may be used for other purposes; ask your health care provider or pharmacist if you have questions. COMMON BRAND NAME(S): Alymsys, Avastin, MVASI, Omer Jack What should I tell my care team before I take this medication? They need to know if you have any of these conditions: Blood clots Coughing up blood Having or recent surgery Heart failure High blood pressure History of a connection between 2 or more body parts that do not usually connect (fistula) History of a tear in your stomach or intestines Protein in your urine An unusual or allergic reaction to bevacizumab, other medications, foods, dyes, or preservatives Pregnant or trying to get pregnant Breast-feeding How should I use this medication? This medication is injected into a vein. It is given by your care team in a hospital or clinic setting. Talk to your care team the use of this medication in children. Special care may be needed. Overdosage: If you think you have taken too much of this medicine contact a poison control center or emergency room at once. NOTE: This medicine is only for you. Do not share this medicine with others. What if I miss a dose? Keep appointments for follow-up doses. It is important not to miss your dose. Call your care team if you are unable to keep an appointment. What may interact with this medication? Interactions are not expected. This list may not describe all possible interactions. Give your health care provider a list of all the medicines, herbs, non-prescription drugs, or dietary supplements you use. Also tell them if you smoke, drink alcohol, or use illegal drugs. Some items may interact with your medicine. What should I  watch for while using this medication? Your condition will be monitored carefully while you are receiving this medication. You may need blood work while taking this medication. This medication may make you feel generally unwell. This is not uncommon as chemotherapy can affect healthy cells as well as cancer cells. Report any side effects. Continue your course of treatment even though you feel ill unless your care team tells you to stop. This medication may increase your risk to bruise or bleed. Call your care team if you notice any unusual bleeding. Before having surgery, talk to your care team to make sure it is ok. This medication can increase the risk of poor healing of your surgical site or wound. You will need to stop this medication for 28 days before surgery. After surgery, wait at least 28 days before restarting this medication. Make sure the surgical site or wound is healed enough before restarting this medication. Talk to your care team if questions. Talk to your care team if you may be pregnant. Serious birth defects can occur if you take this medication during pregnancy and for 6 months after the last dose. Contraception is recommended while taking this medication and for 6 months after the last dose. Your care team can help you find the option that works for you. Do not breastfeed while taking this medication and for 6 months after the last dose. This medication can cause infertility. Talk to your care team if you are concerned about your fertility. What side effects may I notice from receiving this medication? Side effects that you should report  to your care team as soon as possible: Allergic reactions--skin rash, itching, hives, swelling of the face, lips, tongue, or throat Bleeding--bloody or black, tar-like stools, vomiting blood or brown material that looks like coffee grounds, red or dark brown urine, small red or purple spots on skin, unusual bruising or bleeding Blood clot--pain,  swelling, or warmth in the leg, shortness of breath, chest pain Heart attack--pain or tightness in the chest, shoulders, arms, or jaw, nausea, shortness of breath, cold or clammy skin, feeling faint or lightheaded Heart failure--shortness of breath, swelling of the ankles, feet, or hands, sudden weight gain, unusual weakness or fatigue Increase in blood pressure Infection--fever, chills, cough, sore throat, wounds that don't heal, pain or trouble when passing urine, general feeling of discomfort or being unwell Infusion reactions--chest pain, shortness of breath or trouble breathing, feeling faint or lightheaded Kidney injury--decrease in the amount of urine, swelling of the ankles, hands, or feet Stomach pain that is severe, does not go away, or gets worse Stroke--sudden numbness or weakness of the face, arm, or leg, trouble speaking, confusion, trouble walking, loss of balance or coordination, dizziness, severe headache, change in vision Sudden and severe headache, confusion, change in vision, seizures, which may be signs of posterior reversible encephalopathy syndrome (PRES) Side effects that usually do not require medical attention (report to your care team if they continue or are bothersome): Back pain Change in taste Diarrhea Dry skin Increased tears Nosebleed This list may not describe all possible side effects. Call your doctor for medical advice about side effects. You may report side effects to FDA at 1-800-FDA-1088. Where should I keep my medication? This medication is given in a hospital or clinic. It will not be stored at home. NOTE: This sheet is a summary. It may not cover all possible information. If you have questions about this medicine, talk to your doctor, pharmacist, or health care provider.  2024 Else[employer]/Gold Standard (2021-10-03 00:00:00) Fluorouracil Injection What is this medication? FLUOROURACIL (flure oh YOOR a sil) treats some types of cancer. It works by slowing  down the growth of cancer cells. This medicine may be used for other purposes; ask your health care provider or pharmacist if you have questions. COMMON BRAND NAME(S): Adrucil What should I tell my care team before I take this medication? They need to know if you have any of these conditions: Blood disorders Dihydropyrimidine dehydrogenase (DPD) deficiency Infection, such as chickenpox, cold sores, herpes Kidney disease Liver disease Poor nutrition Recent or ongoing radiation therapy An unusual or allergic reaction to fluorouracil, other medications, foods, dyes, or preservatives If you or your partner are pregnant or trying to get pregnant Breast-feeding How should I use this medication? This medication is injected into a vein. It is administered by your care team in a hospital or clinic setting. Talk to your care team about the use of this medication in children. Special care may be needed. Overdosage: If you think you have taken too much of this medicine contact a poison control center or emergency room at once. NOTE: This medicine is only for you. Do not share this medicine with others. What if I miss a dose? Keep appointments for follow-up doses. It is important not to miss your dose. Call your care team if you are unable to keep an appointment. What may interact with this medication? Do not take this medication with any of the following: Live virus vaccines This medication may also interact with the following: Medications that treat or prevent blood  clots, such as warfarin, enoxaparin, dalteparin This list may not describe all possible interactions. Give your health care provider a list of all the medicines, herbs, non-prescription drugs, or dietary supplements you use. Also tell them if you smoke, drink alcohol, or use illegal drugs. Some items may interact with your medicine. What should I watch for while using this medication? Your condition will be monitored carefully while you  are receiving this medication. This medication may make you feel generally unwell. This is not uncommon as chemotherapy can affect healthy cells as well as cancer cells. Report any side effects. Continue your course of treatment even though you feel ill unless your care team tells you to stop. In some cases, you may be given additional medications to help with side effects. Follow all directions for their use. This medication may increase your risk of getting an infection. Call your care team for advice if you get a fever, chills, sore throat, or other symptoms of a cold or flu. Do not treat yourself. Try to avoid being around people who are sick. This medication may increase your risk to bruise or bleed. Call your care team if you notice any unusual bleeding. Be careful brushing or flossing your teeth or using a toothpick because you may get an infection or bleed more easily. If you have any dental work done, tell your dentist you are receiving this medication. Avoid taking medications that contain aspirin, acetaminophen, ibuprofen, naproxen, or ketoprofen unless instructed by your care team. These medications may hide a fever. Do not treat diarrhea with over the counter products. Contact your care team if you have diarrhea that lasts more than 2 days or if it is severe and watery. This medication can make you more sensitive to the sun. Keep out of the sun. If you cannot avoid being in the sun, wear protective clothing and sunscreen. Do not use sun lamps, tanning beds, or tanning booths. Talk to your care team if you or your partner wish to become pregnant or think you might be pregnant. This medication can cause serious birth defects if taken during pregnancy and for 3 months after the last dose. A reliable form of contraception is recommended while taking this medication and for 3 months after the last dose. Talk to your care team about effective forms of contraception. Do not father a child while taking  this medication and for 3 months after the last dose. Use a condom while having sex during this time period. Do not breastfeed while taking this medication. This medication may cause infertility. Talk to your care team if you are concerned about your fertility. What side effects may I notice from receiving this medication? Side effects that you should report to your care team as soon as possible: Allergic reactions--skin rash, itching, hives, swelling of the face, lips, tongue, or throat Heart attack--pain or tightness in the chest, shoulders, arms, or jaw, nausea, shortness of breath, cold or clammy skin, feeling faint or lightheaded Heart failure--shortness of breath, swelling of the ankles, feet, or hands, sudden weight gain, unusual weakness or fatigue Heart rhythm changes--fast or irregular heartbeat, dizziness, feeling faint or lightheaded, chest pain, trouble breathing High ammonia level--unusual weakness or fatigue, confusion, loss of appetite, nausea, vomiting, seizures Infection--fever, chills, cough, sore throat, wounds that don't heal, pain or trouble when passing urine, general feeling of discomfort or being unwell Low red blood cell level--unusual weakness or fatigue, dizziness, headache, trouble breathing Pain, tingling, or numbness in the hands or feet,  muscle weakness, change in vision, confusion or trouble speaking, loss of balance or coordination, trouble walking, seizures Redness, swelling, and blistering of the skin over hands and feet Severe or prolonged diarrhea Unusual bruising or bleeding Side effects that usually do not require medical attention (report to your care team if they continue or are bothersome): Dry skin Headache Increased tears Nausea Pain, redness, or swelling with sores inside the mouth or throat Sensitivity to light Vomiting This list may not describe all possible side effects. Call your doctor for medical advice about side effects. You may report  side effects to FDA at 1-800-FDA-1088. Where should I keep my medication? This medication is given in a hospital or clinic. It will not be stored at home. NOTE: This sheet is a summary. It may not cover all possible information. If you have questions about this medicine, talk to your doctor, pharmacist, or health care provider.  2024 [employer]/Gold Standard (2021-09-23 00:00:00) Oxaliplatin Injection What is this medication? OXALIPLATIN (ox AL i PLA tin) treats colorectal cancer. It works by slowing down the growth of cancer cells. This medicine may be used for other purposes; ask your health care provider or pharmacist if you have questions. COMMON BRAND NAME(S): Eloxatin What should I tell my care team before I take this medication? They need to know if you have any of these conditions: Heart disease History of irregular heartbeat or rhythm Liver disease Low blood cell levels (white cells, red cells, and platelets) Lung or breathing disease, such as asthma Take medications that treat or prevent blood clots Tingling of the fingers, toes, or other nerve disorder An unusual or allergic reaction to oxaliplatin, other medications, foods, dyes, or preservatives If you or your partner are pregnant or trying to get pregnant Breast-feeding How should I use this medication? This medication is injected into a vein. It is given by your care team in a hospital or clinic setting. Talk to your care team about the use of this medication in children. Special care may be needed. Overdosage: If you think you have taken too much of this medicine contact a poison control center or emergency room at once. NOTE: This medicine is only for you. Do not share this medicine with others. What if I miss a dose? Keep appointments for follow-up doses. It is important not to miss a dose. Call your care team if you are unable to keep an appointment. What may interact with this medication? Do not take this medication  with any of the following: Cisapride Dronedarone Pimozide Thioridazine This medication may also interact with the following: Aspirin and aspirin-like medications Certain medications that treat or prevent blood clots, such as warfarin, apixaban, dabigatran, and rivaroxaban Cisplatin Cyclosporine Diuretics Medications for infection, such as acyclovir, adefovir, amphotericin B, bacitracin, cidofovir, foscarnet, ganciclovir, gentamicin, pentamidine, vancomycin NSAIDs, medications for pain and inflammation, such as ibuprofen or naproxen Other medications that cause heart rhythm changes Pamidronate Zoledronic acid This list may not describe all possible interactions. Give your health care provider a list of all the medicines, herbs, non-prescription drugs, or dietary supplements you use. Also tell them if you smoke, drink alcohol, or use illegal drugs. Some items may interact with your medicine. What should I watch for while using this medication? Your condition will be monitored carefully while you are receiving this medication. You may need blood work while taking this medication. This medication may make you feel generally unwell. This is not uncommon as chemotherapy can affect healthy cells as well as cancer  cells. Report any side effects. Continue your course of treatment even though you feel ill unless your care team tells you to stop. This medication may increase your risk of getting an infection. Call your care team for advice if you get a fever, chills, sore throat, or other symptoms of a cold or flu. Do not treat yourself. Try to avoid being around people who are sick. Avoid taking medications that contain aspirin, acetaminophen, ibuprofen, naproxen, or ketoprofen unless instructed by your care team. These medications may hide a fever. Be careful brushing or flossing your teeth or using a toothpick because you may get an infection or bleed more easily. If you have any dental work done,  tell your dentist you are receiving this medication. This medication can make you more sensitive to cold. Do not drink cold drinks or use ice. Cover exposed skin before coming in contact with cold temperatures or cold objects. When out in cold weather wear warm clothing and cover your mouth and nose to warm the air that goes into your lungs. Tell your care team if you get sensitive to the cold. Talk to your care team if you or your partner are pregnant or think either of you might be pregnant. This medication can cause serious birth defects if taken during pregnancy and for 9 months after the last dose. A negative pregnancy test is required before starting this medication. A reliable form of contraception is recommended while taking this medication and for 9 months after the last dose. Talk to your care team about effective forms of contraception. Do not father a child while taking this medication and for 6 months after the last dose. Use a condom while having sex during this time period. Do not breastfeed while taking this medication and for 3 months after the last dose. This medication may cause infertility. Talk to your care team if you are concerned about your fertility. What side effects may I notice from receiving this medication? Side effects that you should report to your care team as soon as possible: Allergic reactions--skin rash, itching, hives, swelling of the face, lips, tongue, or throat Bleeding--bloody or black, tar-like stools, vomiting blood or brown material that looks like coffee grounds, red or dark brown urine, small red or purple spots on skin, unusual bruising or bleeding Dry cough, shortness of breath or trouble breathing Heart rhythm changes--fast or irregular heartbeat, dizziness, feeling faint or lightheaded, chest pain, trouble breathing Infection--fever, chills, cough, sore throat, wounds that don't heal, pain or trouble when passing urine, general feeling of discomfort or  being unwell Liver injury--right upper belly pain, loss of appetite, nausea, light-colored stool, dark yellow or brown urine, yellowing skin or eyes, unusual weakness or fatigue Low red blood cell level--unusual weakness or fatigue, dizziness, headache, trouble breathing Muscle injury--unusual weakness or fatigue, muscle pain, dark yellow or brown urine, decrease in amount of urine Pain, tingling, or numbness in the hands or feet Sudden and severe headache, confusion, change in vision, seizures, which may be signs of posterior reversible encephalopathy syndrome (PRES) Unusual bruising or bleeding Side effects that usually do not require medical attention (report to your care team if they continue or are bothersome): Diarrhea Nausea Pain, redness, or swelling with sores inside the mouth or throat Unusual weakness or fatigue Vomiting This list may not describe all possible side effects. Call your doctor for medical advice about side effects. You may report side effects to FDA at 1-800-FDA-1088. Where should I keep my medication? This  medication is given in a hospital or clinic. It will not be stored at home. NOTE: This sheet is a summary. It may not cover all possible information. If you have questions about this medicine, talk to your doctor, pharmacist, or health care provider.  2024 [employer] Standard (2022-03-03 00:00:00)

## 2022-12-28 ENCOUNTER — Ambulatory Visit: Payer: 59

## 2022-12-29 ENCOUNTER — Ambulatory Visit: Payer: 59

## 2022-12-30 ENCOUNTER — Telehealth: Payer: Self-pay | Admitting: Internal Medicine

## 2022-12-30 ENCOUNTER — Ambulatory Visit: Payer: 59

## 2022-12-30 NOTE — Telephone Encounter (Signed)
Called patient regarding September appointments, left a voicemail.

## 2022-12-31 ENCOUNTER — Inpatient Hospital Stay: Payer: 59 | Attending: Oncology

## 2022-12-31 VITALS — BP 117/85 | HR 99 | Temp 98.0°F | Resp 18 | Ht 68.0 in | Wt 152.0 lb

## 2022-12-31 DIAGNOSIS — E876 Hypokalemia: Secondary | ICD-10-CM | POA: Diagnosis not present

## 2022-12-31 DIAGNOSIS — C7802 Secondary malignant neoplasm of left lung: Secondary | ICD-10-CM | POA: Diagnosis not present

## 2022-12-31 DIAGNOSIS — C787 Secondary malignant neoplasm of liver and intrahepatic bile duct: Secondary | ICD-10-CM | POA: Diagnosis not present

## 2022-12-31 DIAGNOSIS — Z5189 Encounter for other specified aftercare: Secondary | ICD-10-CM | POA: Insufficient documentation

## 2022-12-31 DIAGNOSIS — D696 Thrombocytopenia, unspecified: Secondary | ICD-10-CM | POA: Diagnosis not present

## 2022-12-31 DIAGNOSIS — D709 Neutropenia, unspecified: Secondary | ICD-10-CM | POA: Diagnosis not present

## 2022-12-31 DIAGNOSIS — C189 Malignant neoplasm of colon, unspecified: Secondary | ICD-10-CM | POA: Insufficient documentation

## 2022-12-31 DIAGNOSIS — C182 Malignant neoplasm of ascending colon: Secondary | ICD-10-CM

## 2022-12-31 DIAGNOSIS — C7801 Secondary malignant neoplasm of right lung: Secondary | ICD-10-CM | POA: Diagnosis not present

## 2022-12-31 DIAGNOSIS — C7971 Secondary malignant neoplasm of right adrenal gland: Secondary | ICD-10-CM | POA: Diagnosis not present

## 2022-12-31 DIAGNOSIS — C7931 Secondary malignant neoplasm of brain: Secondary | ICD-10-CM | POA: Insufficient documentation

## 2022-12-31 DIAGNOSIS — Z5111 Encounter for antineoplastic chemotherapy: Secondary | ICD-10-CM | POA: Diagnosis not present

## 2022-12-31 DIAGNOSIS — C786 Secondary malignant neoplasm of retroperitoneum and peritoneum: Secondary | ICD-10-CM | POA: Insufficient documentation

## 2022-12-31 MED ORDER — FILGRASTIM-SNDZ 480 MCG/0.8ML IJ SOSY
480.0000 ug | PREFILLED_SYRINGE | Freq: Once | INTRAMUSCULAR | Status: AC
  Administered 2022-12-31: 480 ug via SUBCUTANEOUS
  Filled 2022-12-31: qty 0.8

## 2022-12-31 NOTE — Patient Instructions (Signed)
Filgrastim Injection What is this medication? FILGRASTIM (fil GRA stim) lowers the risk of infection in people who are receiving chemotherapy. It works by helping your body make more white blood cells, which protects your body from infection. It may also be used to help people who have been exposed to high doses of radiation. It can be used to help prepare your body before a stem cell transplant. It works by helping your bone marrow make and release stem cells into the blood. This medicine may be used for other purposes; ask your health care provider or pharmacist if you have questions. COMMON BRAND NAME(S): Neupogen, Nivestym, Releuko, Zarxio What should I tell my care team before I take this medication? They need to know if you have any of these conditions: History of blood diseases, such as sickle cell anemia Kidney disease Recent or ongoing radiation An unusual or allergic reaction to filgrastim, pegfilgrastim, latex, rubber, other medications, foods, dyes, or preservatives Pregnant or trying to get pregnant Breast-feeding How should I use this medication? This medication is injected under the skin or into a vein. It is usually given by your care team in a hospital or clinic setting. It may be given at home. If you get this medication at home, you will be taught how to prepare and give it. Use exactly as directed. Take it as directed on the prescription label at the same time every day. Keep taking it unless your care team tells you to stop. It is important that you put your used needles and syringes in a special sharps container. Do not put them in a trash can. If you do not have a sharps container, call your pharmacist or care team to get one. This medication comes with INSTRUCTIONS FOR USE. Ask your pharmacist for directions on how to use this medication. Read the information carefully. Talk to your pharmacist or care team if you have questions. Talk to your care team about the use of this  medication in children. While it may be prescribed for children for selected conditions, precautions do apply. Overdosage: If you think you have taken too much of this medicine contact a poison control center or emergency room at once. NOTE: This medicine is only for you. Do not share this medicine with others. What if I miss a dose? It is important not to miss any doses. Talk to your care team about what to do if you miss a dose. What may interact with this medication? Medications that may cause a release of neutrophils, such as lithium This list may not describe all possible interactions. Give your health care provider a list of all the medicines, herbs, non-prescription drugs, or dietary supplements you use. Also tell them if you smoke, drink alcohol, or use illegal drugs. Some items may interact with your medicine. What should I watch for while using this medication? Your condition will be monitored carefully while you are receiving this medication. You may need bloodwork while taking this medication. Talk to your care team about your risk of cancer. You may be more at risk for certain types of cancer if you take this medication. What side effects may I notice from receiving this medication? Side effects that you should report to your care team as soon as possible: Allergic reactions--skin rash, itching, hives, swelling of the face, lips, tongue, or throat Capillary leak syndrome--stomach or muscle pain, unusual weakness or fatigue, feeling faint or lightheaded, decrease in the amount of urine, swelling of the ankles, hands, or   feet, trouble breathing High white blood cell level--fever, fatigue, trouble breathing, night sweats, change in vision, weight loss Inflammation of the aorta--fever, fatigue, back, chest, or stomach pain, severe headache Kidney injury (glomerulonephritis)--decrease in the amount of urine, red or dark brown urine, foamy or bubbly urine, swelling of the ankles, hands, or  feet Shortness of breath or trouble breathing Spleen injury--pain in upper left stomach or shoulder Unusual bruising or bleeding Side effects that usually do not require medical attention (report to your care team if they continue or are bothersome): Back pain Bone pain Fatigue Fever Headache Nausea This list may not describe all possible side effects. Call your doctor for medical advice about side effects. You may report side effects to FDA at 1-800-FDA-1088. Where should I keep my medication? Keep out of the reach of children and pets. Keep this medication in the original packaging until you are ready to take it. Protect from light. See product for storage information. Each product may have different instructions. Get rid of any unused medication after the expiration date. To get rid of medications that are no longer needed or have expired: Take the medication to a medications take-back program. Check with your pharmacy or law enforcement to find a location. If you cannot return the medication, ask your pharmacist or care team how to get rid of this medication safely. NOTE: This sheet is a summary. It may not cover all possible information. If you have questions about this medicine, talk to your doctor, pharmacist, or health care provider.  2024 Elsevier/Gold Standard (2021-10-09 00:00:00)  

## 2023-01-01 ENCOUNTER — Encounter: Payer: Self-pay | Admitting: Oncology

## 2023-01-01 ENCOUNTER — Inpatient Hospital Stay: Payer: 59

## 2023-01-01 DIAGNOSIS — C786 Secondary malignant neoplasm of retroperitoneum and peritoneum: Secondary | ICD-10-CM | POA: Diagnosis not present

## 2023-01-01 DIAGNOSIS — C189 Malignant neoplasm of colon, unspecified: Secondary | ICD-10-CM

## 2023-01-01 DIAGNOSIS — E876 Hypokalemia: Secondary | ICD-10-CM | POA: Diagnosis not present

## 2023-01-01 DIAGNOSIS — C182 Malignant neoplasm of ascending colon: Secondary | ICD-10-CM

## 2023-01-01 DIAGNOSIS — C7931 Secondary malignant neoplasm of brain: Secondary | ICD-10-CM | POA: Diagnosis not present

## 2023-01-01 DIAGNOSIS — Z5111 Encounter for antineoplastic chemotherapy: Secondary | ICD-10-CM | POA: Diagnosis not present

## 2023-01-01 DIAGNOSIS — C787 Secondary malignant neoplasm of liver and intrahepatic bile duct: Secondary | ICD-10-CM | POA: Diagnosis not present

## 2023-01-01 DIAGNOSIS — D709 Neutropenia, unspecified: Secondary | ICD-10-CM | POA: Diagnosis not present

## 2023-01-01 DIAGNOSIS — C7802 Secondary malignant neoplasm of left lung: Secondary | ICD-10-CM | POA: Diagnosis not present

## 2023-01-01 DIAGNOSIS — C7801 Secondary malignant neoplasm of right lung: Secondary | ICD-10-CM | POA: Diagnosis not present

## 2023-01-01 DIAGNOSIS — D696 Thrombocytopenia, unspecified: Secondary | ICD-10-CM | POA: Diagnosis not present

## 2023-01-01 DIAGNOSIS — C7971 Secondary malignant neoplasm of right adrenal gland: Secondary | ICD-10-CM | POA: Diagnosis not present

## 2023-01-01 DIAGNOSIS — Z5189 Encounter for other specified aftercare: Secondary | ICD-10-CM | POA: Diagnosis not present

## 2023-01-01 MED ORDER — FILGRASTIM-SNDZ 480 MCG/0.8ML IJ SOSY
480.0000 ug | PREFILLED_SYRINGE | Freq: Once | INTRAMUSCULAR | Status: AC
  Administered 2023-01-01: 480 ug via SUBCUTANEOUS
  Filled 2023-01-01: qty 0.8

## 2023-01-02 ENCOUNTER — Other Ambulatory Visit: Payer: Self-pay | Admitting: Psychiatry

## 2023-01-02 DIAGNOSIS — F422 Mixed obsessional thoughts and acts: Secondary | ICD-10-CM

## 2023-01-04 ENCOUNTER — Encounter: Payer: Self-pay | Admitting: Hematology and Oncology

## 2023-01-04 ENCOUNTER — Encounter: Payer: Self-pay | Admitting: Oncology

## 2023-01-04 ENCOUNTER — Inpatient Hospital Stay: Payer: 59

## 2023-01-04 ENCOUNTER — Inpatient Hospital Stay (HOSPITAL_BASED_OUTPATIENT_CLINIC_OR_DEPARTMENT_OTHER): Payer: 59 | Admitting: Hematology and Oncology

## 2023-01-04 VITALS — BP 120/73 | HR 94 | Temp 98.1°F | Resp 18 | Ht 68.0 in | Wt 152.6 lb

## 2023-01-04 DIAGNOSIS — C189 Malignant neoplasm of colon, unspecified: Secondary | ICD-10-CM

## 2023-01-04 DIAGNOSIS — D709 Neutropenia, unspecified: Secondary | ICD-10-CM | POA: Diagnosis not present

## 2023-01-04 DIAGNOSIS — C7800 Secondary malignant neoplasm of unspecified lung: Secondary | ICD-10-CM | POA: Diagnosis not present

## 2023-01-04 DIAGNOSIS — Z5111 Encounter for antineoplastic chemotherapy: Secondary | ICD-10-CM | POA: Diagnosis not present

## 2023-01-04 DIAGNOSIS — R0602 Shortness of breath: Secondary | ICD-10-CM

## 2023-01-04 DIAGNOSIS — C7971 Secondary malignant neoplasm of right adrenal gland: Secondary | ICD-10-CM | POA: Diagnosis not present

## 2023-01-04 DIAGNOSIS — Z5189 Encounter for other specified aftercare: Secondary | ICD-10-CM | POA: Diagnosis not present

## 2023-01-04 DIAGNOSIS — C786 Secondary malignant neoplasm of retroperitoneum and peritoneum: Secondary | ICD-10-CM | POA: Diagnosis not present

## 2023-01-04 DIAGNOSIS — C182 Malignant neoplasm of ascending colon: Secondary | ICD-10-CM

## 2023-01-04 DIAGNOSIS — C787 Secondary malignant neoplasm of liver and intrahepatic bile duct: Secondary | ICD-10-CM

## 2023-01-04 DIAGNOSIS — C7931 Secondary malignant neoplasm of brain: Secondary | ICD-10-CM | POA: Diagnosis not present

## 2023-01-04 DIAGNOSIS — C7801 Secondary malignant neoplasm of right lung: Secondary | ICD-10-CM | POA: Diagnosis not present

## 2023-01-04 DIAGNOSIS — D696 Thrombocytopenia, unspecified: Secondary | ICD-10-CM | POA: Diagnosis not present

## 2023-01-04 DIAGNOSIS — C7802 Secondary malignant neoplasm of left lung: Secondary | ICD-10-CM | POA: Diagnosis not present

## 2023-01-04 DIAGNOSIS — E876 Hypokalemia: Secondary | ICD-10-CM | POA: Diagnosis not present

## 2023-01-04 LAB — BASIC METABOLIC PANEL
BUN: 13 (ref 4–21)
CO2: 22 (ref 13–22)
Chloride: 106 (ref 99–108)
Creatinine: 1.2 — AB (ref 0.5–1.1)
Glucose: 130
Potassium: 3.3 mEq/L — AB (ref 3.5–5.1)
Sodium: 140 (ref 137–147)

## 2023-01-04 LAB — HEPATIC FUNCTION PANEL
ALT: 18 U/L (ref 7–35)
AST: 27 (ref 13–35)
Alkaline Phosphatase: 144 — AB (ref 25–125)
Bilirubin, Total: 0.8

## 2023-01-04 LAB — CBC AND DIFFERENTIAL
EOS%: 2 %
HCT: 33 — AB (ref 36–46)
Hemoglobin: 11.4 — AB (ref 12.0–16.0)
LYMPH%: 50 %
MCV: 101 — AB (ref 81–99)
MONO%: 20 %
NEUT%: 28 %
Neutrophils Absolute: 0.53
Platelets: 42 10*3/uL — AB (ref 150–400)
WBC: 1.9

## 2023-01-04 LAB — COMPREHENSIVE METABOLIC PANEL
Albumin: 4.1 (ref 3.5–5.0)
Calcium: 9.2 (ref 8.7–10.7)

## 2023-01-04 LAB — CBC: RBC: 3.28 — AB (ref 3.87–5.11)

## 2023-01-04 LAB — D-DIMER, QUANTITATIVE: D-Dimer, Quant: 2.36 — AB (ref ?–0.59)

## 2023-01-04 MED ORDER — FILGRASTIM-SNDZ 480 MCG/0.8ML IJ SOSY
480.0000 ug | PREFILLED_SYRINGE | Freq: Once | INTRAMUSCULAR | Status: AC
  Administered 2023-01-04: 480 ug via SUBCUTANEOUS
  Filled 2023-01-04: qty 0.8

## 2023-01-05 ENCOUNTER — Encounter: Payer: Self-pay | Admitting: Hematology and Oncology

## 2023-01-05 ENCOUNTER — Encounter: Payer: Self-pay | Admitting: Oncology

## 2023-01-05 ENCOUNTER — Telehealth: Payer: Self-pay

## 2023-01-05 DIAGNOSIS — C189 Malignant neoplasm of colon, unspecified: Secondary | ICD-10-CM | POA: Diagnosis not present

## 2023-01-05 DIAGNOSIS — C7800 Secondary malignant neoplasm of unspecified lung: Secondary | ICD-10-CM | POA: Diagnosis not present

## 2023-01-05 DIAGNOSIS — R0602 Shortness of breath: Secondary | ICD-10-CM | POA: Diagnosis not present

## 2023-01-05 DIAGNOSIS — R918 Other nonspecific abnormal finding of lung field: Secondary | ICD-10-CM | POA: Diagnosis not present

## 2023-01-05 NOTE — Telephone Encounter (Addendum)
-----   Message from Shannon [name] sent at 01/05/2023  4:14 PM EDT ----- Would you call her and tell her she did not have blood clot in the lung. No changes to explain shortness of breath. Thanks

## 2023-01-06 ENCOUNTER — Inpatient Hospital Stay: Payer: 59

## 2023-01-06 ENCOUNTER — Other Ambulatory Visit: Payer: Self-pay

## 2023-01-06 VITALS — BP 115/78 | HR 99 | Temp 98.4°F | Resp 18 | Ht 68.0 in | Wt 149.1 lb

## 2023-01-06 DIAGNOSIS — C189 Malignant neoplasm of colon, unspecified: Secondary | ICD-10-CM | POA: Diagnosis not present

## 2023-01-06 DIAGNOSIS — Z5189 Encounter for other specified aftercare: Secondary | ICD-10-CM | POA: Diagnosis not present

## 2023-01-06 DIAGNOSIS — C7802 Secondary malignant neoplasm of left lung: Secondary | ICD-10-CM | POA: Diagnosis not present

## 2023-01-06 DIAGNOSIS — C786 Secondary malignant neoplasm of retroperitoneum and peritoneum: Secondary | ICD-10-CM | POA: Diagnosis not present

## 2023-01-06 DIAGNOSIS — C7801 Secondary malignant neoplasm of right lung: Secondary | ICD-10-CM | POA: Diagnosis not present

## 2023-01-06 DIAGNOSIS — D709 Neutropenia, unspecified: Secondary | ICD-10-CM | POA: Diagnosis not present

## 2023-01-06 DIAGNOSIS — C7931 Secondary malignant neoplasm of brain: Secondary | ICD-10-CM | POA: Diagnosis not present

## 2023-01-06 DIAGNOSIS — C182 Malignant neoplasm of ascending colon: Secondary | ICD-10-CM

## 2023-01-06 DIAGNOSIS — C7971 Secondary malignant neoplasm of right adrenal gland: Secondary | ICD-10-CM | POA: Diagnosis not present

## 2023-01-06 DIAGNOSIS — D696 Thrombocytopenia, unspecified: Secondary | ICD-10-CM | POA: Diagnosis not present

## 2023-01-06 DIAGNOSIS — E876 Hypokalemia: Secondary | ICD-10-CM | POA: Diagnosis not present

## 2023-01-06 DIAGNOSIS — C787 Secondary malignant neoplasm of liver and intrahepatic bile duct: Secondary | ICD-10-CM | POA: Diagnosis not present

## 2023-01-06 DIAGNOSIS — Z5111 Encounter for antineoplastic chemotherapy: Secondary | ICD-10-CM | POA: Diagnosis not present

## 2023-01-06 DIAGNOSIS — D702 Other drug-induced agranulocytosis: Secondary | ICD-10-CM

## 2023-01-06 MED ORDER — POTASSIUM CHLORIDE 10 MEQ/100ML IV SOLN
10.0000 meq | INTRAVENOUS | Status: AC
  Administered 2023-01-06 (×2): 10 meq via INTRAVENOUS
  Filled 2023-01-06 (×2): qty 100

## 2023-01-06 MED ORDER — SODIUM CHLORIDE 0.9% FLUSH
10.0000 mL | Freq: Once | INTRAVENOUS | Status: AC | PRN
  Administered 2023-01-06: 10 mL

## 2023-01-06 MED ORDER — FILGRASTIM-SNDZ 480 MCG/0.8ML IJ SOSY
480.0000 ug | PREFILLED_SYRINGE | Freq: Once | INTRAMUSCULAR | Status: AC
  Administered 2023-01-06: 480 ug via SUBCUTANEOUS
  Filled 2023-01-06: qty 0.8

## 2023-01-06 MED ORDER — SODIUM CHLORIDE 0.9 % IV SOLN: Freq: Once | INTRAVENOUS | Status: AC

## 2023-01-06 MED ORDER — HEPARIN SOD (PORK) LOCK FLUSH 100 UNIT/ML IV SOLN
500.0000 [IU] | Freq: Once | INTRAVENOUS | Status: AC | PRN
  Administered 2023-01-06: 500 [IU]

## 2023-01-06 NOTE — Patient Instructions (Signed)
Hypokalemia Hypokalemia means that the amount of potassium in the blood is lower than normal. Potassium is a mineral (electrolyte) that helps regulate the amount of fluid in the body. It also stimulates muscle tightening (contraction) and helps nerves work properly. Normally, most of the body's potassium is inside cells, and only a very small amount is in the blood. Because the amount in the blood is so small, minor changes to potassium levels in the blood can be life-threatening. What are the causes? This condition may be caused by: Antibiotic medicine. Diarrhea or vomiting. Taking too much of a medicine that helps you have a bowel movement (laxative) can cause diarrhea and lead to hypokalemia. Chronic kidney disease (CKD). Medicines that help the body get rid of excess fluid (diuretics). Eating disorders, such as anorexia or bulimia. Low magnesium levels in the body. Sweating a lot. What are the signs or symptoms? Symptoms of this condition include: Weakness. Constipation. Fatigue. Muscle cramps. Mental confusion. Skipped heartbeats or irregular heartbeat (palpitations). Tingling or numbness. How is this diagnosed? This condition is diagnosed with a blood test. How is this treated? This condition may be treated by: Taking potassium supplements. Adjusting the medicines that you take. Eating more foods that contain a lot of potassium. If your potassium level is very low, you may need to get potassium through an IV and be monitored in the hospital. Follow these instructions at home: Eating and drinking  Eat a healthy diet. A healthy diet includes fresh fruits and vegetables, whole grains, healthy fats, and lean proteins. If told, eat more foods that contain a lot of potassium. These include: Nuts, such as peanuts and pistachios. Seeds, such as sunflower seeds and pumpkin seeds. Peas, lentils, and lima beans. Whole grain and bran cereals and breads. Fresh fruits and vegetables,  such as apricots, avocado, bananas, cantaloupe, kiwi, oranges, tomatoes, asparagus, and potatoes. Juices, such as orange, tomato, and prune. Lean meats, including fish. Milk and milk products, such as yogurt. General instructions Take over-the-counter and prescription medicines only as told by your health care provider. This includes vitamins, natural food products, and supplements. Keep all follow-up visits. This is important. Contact a health care provider if: You have weakness that gets worse. You feel your heart pounding or racing. You vomit. You have diarrhea. You have diabetes and you have trouble keeping your blood sugar in your target range. Get help right away if: You have chest pain. You have shortness of breath. You have vomiting or diarrhea that lasts for more than 2 days. You faint. These symptoms may be an emergency. Get help right away. Call 911. Do not wait to see if the symptoms will go away. Do not drive yourself to the hospital. Summary Hypokalemia means that the amount of potassium in the blood is lower than normal. This condition is diagnosed with a blood test. Hypokalemia may be treated by taking potassium supplements, adjusting the medicines that you take, or eating more foods that are high in potassium. If your potassium level is very low, you may need to get potassium through an IV and be monitored in the hospital. This information is not intended to replace advice given to you by your health care provider. Make sure you discuss any questions you have with your health care provider. Document Revised: 01/30/2021 Document Reviewed: 01/30/2021 Elsevier Patient Education  2024 [employer] Inc.  

## 2023-01-07 ENCOUNTER — Inpatient Hospital Stay: Payer: 59

## 2023-01-07 VITALS — BP 119/81 | HR 98 | Temp 98.0°F | Resp 18 | Ht 68.0 in | Wt 152.0 lb

## 2023-01-07 DIAGNOSIS — C7801 Secondary malignant neoplasm of right lung: Secondary | ICD-10-CM | POA: Diagnosis not present

## 2023-01-07 DIAGNOSIS — Z5111 Encounter for antineoplastic chemotherapy: Secondary | ICD-10-CM | POA: Diagnosis not present

## 2023-01-07 DIAGNOSIS — C787 Secondary malignant neoplasm of liver and intrahepatic bile duct: Secondary | ICD-10-CM | POA: Diagnosis not present

## 2023-01-07 DIAGNOSIS — C7971 Secondary malignant neoplasm of right adrenal gland: Secondary | ICD-10-CM | POA: Diagnosis not present

## 2023-01-07 DIAGNOSIS — D702 Other drug-induced agranulocytosis: Secondary | ICD-10-CM

## 2023-01-07 DIAGNOSIS — Z5189 Encounter for other specified aftercare: Secondary | ICD-10-CM | POA: Diagnosis not present

## 2023-01-07 DIAGNOSIS — D709 Neutropenia, unspecified: Secondary | ICD-10-CM | POA: Diagnosis not present

## 2023-01-07 DIAGNOSIS — C7931 Secondary malignant neoplasm of brain: Secondary | ICD-10-CM | POA: Diagnosis not present

## 2023-01-07 DIAGNOSIS — C786 Secondary malignant neoplasm of retroperitoneum and peritoneum: Secondary | ICD-10-CM | POA: Diagnosis not present

## 2023-01-07 DIAGNOSIS — C189 Malignant neoplasm of colon, unspecified: Secondary | ICD-10-CM | POA: Diagnosis not present

## 2023-01-07 DIAGNOSIS — D696 Thrombocytopenia, unspecified: Secondary | ICD-10-CM | POA: Diagnosis not present

## 2023-01-07 DIAGNOSIS — E876 Hypokalemia: Secondary | ICD-10-CM | POA: Diagnosis not present

## 2023-01-07 DIAGNOSIS — C7802 Secondary malignant neoplasm of left lung: Secondary | ICD-10-CM | POA: Diagnosis not present

## 2023-01-07 MED ORDER — FILGRASTIM-SNDZ 480 MCG/0.8ML IJ SOSY
480.0000 ug | PREFILLED_SYRINGE | Freq: Once | INTRAMUSCULAR | Status: AC
  Administered 2023-01-07: 480 ug via SUBCUTANEOUS
  Filled 2023-01-07: qty 0.8

## 2023-01-07 NOTE — Patient Instructions (Signed)
Filgrastim Injection What is this medication? FILGRASTIM (fil GRA stim) lowers the risk of infection in people who are receiving chemotherapy. It works by helping your body make more white blood cells, which protects your body from infection. It may also be used to help people who have been exposed to high doses of radiation. It can be used to help prepare your body before a stem cell transplant. It works by helping your bone marrow make and release stem cells into the blood. This medicine may be used for other purposes; ask your health care provider or pharmacist if you have questions. COMMON BRAND NAME(S): Neupogen, Nivestym, Releuko, Zarxio What should I tell my care team before I take this medication? They need to know if you have any of these conditions: History of blood diseases, such as sickle cell anemia Kidney disease Recent or ongoing radiation An unusual or allergic reaction to filgrastim, pegfilgrastim, latex, rubber, other medications, foods, dyes, or preservatives Pregnant or trying to get pregnant Breast-feeding How should I use this medication? This medication is injected under the skin or into a vein. It is usually given by your care team in a hospital or clinic setting. It may be given at home. If you get this medication at home, you will be taught how to prepare and give it. Use exactly as directed. Take it as directed on the prescription label at the same time every day. Keep taking it unless your care team tells you to stop. It is important that you put your used needles and syringes in a special sharps container. Do not put them in a trash can. If you do not have a sharps container, call your pharmacist or care team to get one. This medication comes with INSTRUCTIONS FOR USE. Ask your pharmacist for directions on how to use this medication. Read the information carefully. Talk to your pharmacist or care team if you have questions. Talk to your care team about the use of this  medication in children. While it may be prescribed for children for selected conditions, precautions do apply. Overdosage: If you think you have taken too much of this medicine contact a poison control center or emergency room at once. NOTE: This medicine is only for you. Do not share this medicine with others. What if I miss a dose? It is important not to miss any doses. Talk to your care team about what to do if you miss a dose. What may interact with this medication? Medications that may cause a release of neutrophils, such as lithium This list may not describe all possible interactions. Give your health care provider a list of all the medicines, herbs, non-prescription drugs, or dietary supplements you use. Also tell them if you smoke, drink alcohol, or use illegal drugs. Some items may interact with your medicine. What should I watch for while using this medication? Your condition will be monitored carefully while you are receiving this medication. You may need bloodwork while taking this medication. Talk to your care team about your risk of cancer. You may be more at risk for certain types of cancer if you take this medication. What side effects may I notice from receiving this medication? Side effects that you should report to your care team as soon as possible: Allergic reactions--skin rash, itching, hives, swelling of the face, lips, tongue, or throat Capillary leak syndrome--stomach or muscle pain, unusual weakness or fatigue, feeling faint or lightheaded, decrease in the amount of urine, swelling of the ankles, hands, or   feet, trouble breathing High white blood cell level--fever, fatigue, trouble breathing, night sweats, change in vision, weight loss Inflammation of the aorta--fever, fatigue, back, chest, or stomach pain, severe headache Kidney injury (glomerulonephritis)--decrease in the amount of urine, red or dark brown urine, foamy or bubbly urine, swelling of the ankles, hands, or  feet Shortness of breath or trouble breathing Spleen injury--pain in upper left stomach or shoulder Unusual bruising or bleeding Side effects that usually do not require medical attention (report to your care team if they continue or are bothersome): Back pain Bone pain Fatigue Fever Headache Nausea This list may not describe all possible side effects. Call your doctor for medical advice about side effects. You may report side effects to FDA at 1-800-FDA-1088. Where should I keep my medication? Keep out of the reach of children and pets. Keep this medication in the original packaging until you are ready to take it. Protect from light. See product for storage information. Each product may have different instructions. Get rid of any unused medication after the expiration date. To get rid of medications that are no longer needed or have expired: Take the medication to a medications take-back program. Check with your pharmacy or law enforcement to find a location. If you cannot return the medication, ask your pharmacist or care team how to get rid of this medication safely. NOTE: This sheet is a summary. It may not cover all possible information. If you have questions about this medicine, talk to your doctor, pharmacist, or health care provider.  2024 Elsevier/Gold Standard (2021-10-09 00:00:00)  

## 2023-01-08 ENCOUNTER — Ambulatory Visit
Admission: RE | Admit: 2023-01-08 | Discharge: 2023-01-08 | Disposition: A | Discharge status: Home / Self Care | Payer: 59 | Source: Ambulatory Visit | Attending: Family | Admitting: Family

## 2023-01-08 DIAGNOSIS — Z1231 Encounter for screening mammogram for malignant neoplasm of breast: Secondary | ICD-10-CM | POA: Diagnosis not present

## 2023-01-11 ENCOUNTER — Encounter: Payer: Self-pay | Admitting: Oncology

## 2023-01-12 ENCOUNTER — Ambulatory Visit: Payer: 59

## 2023-01-12 ENCOUNTER — Encounter: Payer: Self-pay | Admitting: Oncology

## 2023-01-14 ENCOUNTER — Encounter: Payer: Self-pay | Admitting: Hematology and Oncology

## 2023-01-14 ENCOUNTER — Encounter: Payer: Self-pay | Admitting: Oncology

## 2023-01-15 ENCOUNTER — Ambulatory Visit: Payer: 59

## 2023-01-16 ENCOUNTER — Other Ambulatory Visit: Payer: Self-pay | Admitting: Psychiatry

## 2023-01-16 DIAGNOSIS — F333 Major depressive disorder, recurrent, severe with psychotic symptoms: Secondary | ICD-10-CM

## 2023-01-17 NOTE — Telephone Encounter (Signed)
From 5/16 visit:  Reduce lamotrigine to 1 of the 100 mg tablets for 1 month then reduce to 1/2 of the 100 mg for 2 weeks and if you feel ok stop it.   Is she still taking?

## 2023-01-17 NOTE — Progress Notes (Unsigned)
[facility]  [address] [place],  [place]  [address] [phone]  Clinic Day:  01/18/2023  Referring physician: Alinda Deem, MD  HISTORY OF PRESENT ILLNESS:  The patient is a 57 y.o. female with  metastatic colon cancer, which includes spread of disease to her abdominal cavity, lungs, brain and right adrenal gland.  She comes in today to be evaluated again before heading into her 10th cycle of  FOLFOX/Avastin.  Of note, her 10th cycle of FOLFOX/Avastin had to be held  2 weeks ago due to severely low blood counts.  The patient claims to be feeling better over these past few weeks.  Despite her cytopenias, her daily quality of life has not declined.  She still has intermittent diarrhea.  She also has intermittent tingling of her fingers.  However, she claims her symptoms are not severe enough to where her oxaliplatin needs to either be decreased or discontinued.  She denies having any new symptoms or findings which concern her for possible disease progression.  With respect to her colon cancer history, she is status post a right hemicolectomy in early October 2018, followed by 12 cycles of adjuvant FOLFOX chemotherapy, which were completed in April 2019.  In December 2021, she underwent a left cerebellar metastasectomy, whose pathology was consistent with metastatic colon cancer.  Tumor testing did come back MMR normal. CT scans revealed evidence of her cancer being in multiple locations, for which she took 40 cycles of FOLFIRI/Avastin.  As follow-up scans revealed liver metastasis, she was placed back on FOLFOX/Avastin, for which she continues to take.  PHYSICAL EXAM:  Blood pressure 104/70, pulse (!) 101, temperature 98.3 F (36.8 C), resp. rate 14, height 5\' 8"  (1.727 m), weight 156 lb (70.8 kg), last menstrual period 02/22/2019, SpO2 95%. Body mass index is 23.72 kg/m.  Performance status (ECOG): 1 - Symptomatic but completely ambulatory  Physical  Exam Vitals and nursing note reviewed.  Constitutional:      General: She is not in acute distress.    Appearance: Normal appearance.  HENT:     Head: Normocephalic and atraumatic.     Mouth/Throat:     Mouth: Mucous membranes are moist.     Pharynx: Oropharynx is clear. No oropharyngeal exudate or posterior oropharyngeal erythema.  Eyes:     General: No scleral icterus.    Extraocular Movements: Extraocular movements intact.     Conjunctiva/sclera: Conjunctivae normal.     Pupils: Pupils are equal, round, and reactive to light.  Cardiovascular:     Rate and Rhythm: Normal rate and regular rhythm.     Heart sounds: Normal heart sounds. No murmur heard.    No friction rub. No gallop.  Pulmonary:     Effort: Pulmonary effort is normal.     Breath sounds: Normal breath sounds. No wheezing, rhonchi or rales.  Abdominal:     General: There is no distension.     Palpations: Abdomen is soft. There is no hepatomegaly, splenomegaly or mass.     Tenderness: There is no abdominal tenderness.  Musculoskeletal:        General: Normal range of motion.     Cervical back: Normal range of motion and neck supple. No tenderness.     Right lower leg: No edema.     Left lower leg: No edema.  Lymphadenopathy:     Cervical: No cervical adenopathy.     Upper Body:     Right upper body: No supraclavicular or  axillary adenopathy.     Left upper body: No supraclavicular or axillary adenopathy.     Lower Body: No right inguinal adenopathy. No left inguinal adenopathy.  Skin:    General: Skin is warm and dry.     Coloration: Skin is not jaundiced.     Findings: No rash.  Neurological:     Mental Status: She is alert and oriented to person, place, and time.     Cranial Nerves: No cranial nerve deficit.  Psychiatric:        Mood and Affect: Mood normal.        Behavior: Behavior normal.        Thought Content: Thought content normal.    LABS:       Latest Ref Rng & Units 01/18/2023   12:00 AM  01/04/2023   12:00 AM 12/22/2022   12:00 AM  CBC  WBC  3.7     1.9     2.6      Hemoglobin 12.0 - 16.0 12.1     11.4     11.5      Hematocrit 36 - 46 36     33     34      Platelets 150 - 400 K/uL 106     42     63         This result is from an external source.      Latest Ref Rng & Units 01/04/2023   12:00 AM 12/18/2022   12:00 AM 12/09/2022    3:12 PM  CMP  Glucose 70 - 99 mg/dL   161   BUN 4 - 21 13     21     29    Creatinine 0.5 - 1.1 1.2     1.4     1.68   Sodium 137 - 147 140     138     132   Potassium 3.5 - 5.1 mEq/L 3.3     4.1     4.3   Chloride 99 - 108 106     104     100   CO2 13 - 22 22     27     20    Calcium 8.7 - 10.7 9.2     9.8     8.3   Total Protein 6.5 - 8.1 g/dL   6.2   Total Bilirubin 0.3 - 1.2 mg/dL   0.6   Alkaline Phos 25 - 125 144     195     129   AST 13 - 35 27     28     27    ALT 7 - 35 U/L 18     17     21       This result is from an external source.   ASSESSMENT & PLAN:  Assessment/Plan:  A 57 y.o. female with metastatic colon cancer.  As her counts are much better, she will proceed with her 10th cycle of FOLFOX/Avastin this week.  Despite having some side effects from her chemotherapy, she does not believe they are severe to where she needs to space out her chemotherapy.  For now, she will continue to receive this regimen every 2 weeks.  I will see her back in 2 weeks before she heads into her 11th cycle of FOLFOX/Avastin.  The patient understands all the plans discussed today and is in agreement with them.  Khalilah Hoke Kirby Funk, MD

## 2023-01-18 ENCOUNTER — Ambulatory Visit: Payer: 59

## 2023-01-18 ENCOUNTER — Inpatient Hospital Stay: Payer: 59

## 2023-01-18 ENCOUNTER — Inpatient Hospital Stay (HOSPITAL_BASED_OUTPATIENT_CLINIC_OR_DEPARTMENT_OTHER): Payer: 59 | Admitting: Oncology

## 2023-01-18 DIAGNOSIS — C7802 Secondary malignant neoplasm of left lung: Secondary | ICD-10-CM | POA: Diagnosis not present

## 2023-01-18 DIAGNOSIS — C182 Malignant neoplasm of ascending colon: Secondary | ICD-10-CM | POA: Diagnosis not present

## 2023-01-18 DIAGNOSIS — C7801 Secondary malignant neoplasm of right lung: Secondary | ICD-10-CM | POA: Diagnosis not present

## 2023-01-18 DIAGNOSIS — C189 Malignant neoplasm of colon, unspecified: Secondary | ICD-10-CM

## 2023-01-18 DIAGNOSIS — C7800 Secondary malignant neoplasm of unspecified lung: Secondary | ICD-10-CM | POA: Diagnosis not present

## 2023-01-18 DIAGNOSIS — C787 Secondary malignant neoplasm of liver and intrahepatic bile duct: Secondary | ICD-10-CM | POA: Diagnosis not present

## 2023-01-18 DIAGNOSIS — Z5189 Encounter for other specified aftercare: Secondary | ICD-10-CM | POA: Diagnosis not present

## 2023-01-18 DIAGNOSIS — C786 Secondary malignant neoplasm of retroperitoneum and peritoneum: Secondary | ICD-10-CM | POA: Diagnosis not present

## 2023-01-18 DIAGNOSIS — D709 Neutropenia, unspecified: Secondary | ICD-10-CM | POA: Diagnosis not present

## 2023-01-18 DIAGNOSIS — C7971 Secondary malignant neoplasm of right adrenal gland: Secondary | ICD-10-CM | POA: Diagnosis not present

## 2023-01-18 DIAGNOSIS — E876 Hypokalemia: Secondary | ICD-10-CM | POA: Diagnosis not present

## 2023-01-18 DIAGNOSIS — Z5111 Encounter for antineoplastic chemotherapy: Secondary | ICD-10-CM | POA: Diagnosis not present

## 2023-01-18 DIAGNOSIS — C7931 Secondary malignant neoplasm of brain: Secondary | ICD-10-CM | POA: Diagnosis not present

## 2023-01-18 DIAGNOSIS — D696 Thrombocytopenia, unspecified: Secondary | ICD-10-CM | POA: Diagnosis not present

## 2023-01-18 LAB — CMP (CANCER CENTER ONLY)
ALT: 20 U/L (ref 0–44)
AST: 25 U/L (ref 15–41)
Albumin: 3.6 g/dL (ref 3.5–5.0)
Alkaline Phosphatase: 126 U/L (ref 38–126)
Anion gap: 14 (ref 5–15)
BUN: 17 mg/dL (ref 6–20)
CO2: 24 mmol/L (ref 22–32)
Calcium: 9.7 mg/dL (ref 8.9–10.3)
Chloride: 106 mmol/L (ref 98–111)
Creatinine: 1.09 mg/dL — ABNORMAL HIGH (ref 0.44–1.00)
GFR, Estimated: 60 mL/min — ABNORMAL LOW (ref >60)
Glucose, Bld: 123 mg/dL — ABNORMAL HIGH (ref 70–99)
Potassium: 4.7 mmol/L (ref 3.5–5.1)
Sodium: 144 mmol/L (ref 135–145)
Total Bilirubin: 0.5 mg/dL (ref 0.3–1.2)
Total Protein: 6.6 g/dL (ref 6.5–8.1)

## 2023-01-18 LAB — CBC AND DIFFERENTIAL
HCT: 36 (ref 36–46)
Hemoglobin: 12.1 (ref 12.0–16.0)
Neutrophils Absolute: 2.44
Platelets: 106 10*3/uL — AB (ref 150–400)
WBC: 3.7

## 2023-01-18 LAB — CBC: RBC: 3.47 — AB (ref 3.87–5.11)

## 2023-01-19 ENCOUNTER — Ambulatory Visit (INDEPENDENT_AMBULATORY_CARE_PROVIDER_SITE_OTHER): Payer: 59 | Admitting: Psychiatry

## 2023-01-19 ENCOUNTER — Ambulatory Visit: Payer: 59

## 2023-01-19 ENCOUNTER — Encounter: Payer: Self-pay | Admitting: Oncology

## 2023-01-19 ENCOUNTER — Encounter: Payer: Self-pay | Admitting: Psychiatry

## 2023-01-19 DIAGNOSIS — G3184 Mild cognitive impairment, so stated: Secondary | ICD-10-CM

## 2023-01-19 DIAGNOSIS — R530 Neoplastic (malignant) related fatigue: Secondary | ICD-10-CM

## 2023-01-19 DIAGNOSIS — F422 Mixed obsessional thoughts and acts: Secondary | ICD-10-CM | POA: Diagnosis not present

## 2023-01-19 DIAGNOSIS — F333 Major depressive disorder, recurrent, severe with psychotic symptoms: Secondary | ICD-10-CM

## 2023-01-19 DIAGNOSIS — G4721 Circadian rhythm sleep disorder, delayed sleep phase type: Secondary | ICD-10-CM

## 2023-01-19 LAB — CEA: CEA: 8.7 ng/mL — ABNORMAL HIGH (ref 0.0–4.7)

## 2023-01-19 MED ORDER — FLUVOXAMINE MALEATE 100 MG PO TABS
300.0000 mg | ORAL_TABLET | Freq: Every day | ORAL | 0 refills | Status: DC
Start: 2023-01-19 — End: 2023-03-26

## 2023-01-19 MED ORDER — CARIPRAZINE HCL 3 MG PO CAPS
3.0000 mg | ORAL_CAPSULE | Freq: Every day | ORAL | 0 refills | Status: DC
Start: 2023-01-19 — End: 2023-05-20

## 2023-01-19 MED ORDER — BUPROPION HCL ER (XL) 150 MG PO TB24
450.0000 mg | ORAL_TABLET | Freq: Every morning | ORAL | 0 refills | Status: DC
Start: 2023-01-19 — End: 2023-04-05

## 2023-01-19 MED ORDER — MODAFINIL 200 MG PO TABS
ORAL_TABLET | ORAL | 1 refills | Status: DC
Start: 2023-01-19 — End: 2023-04-15

## 2023-01-19 MED ORDER — TRAZODONE HCL 50 MG PO TABS
ORAL_TABLET | ORAL | 0 refills | Status: DC
Start: 2023-01-19 — End: 2023-02-11

## 2023-01-19 MED FILL — Oxaliplatin IV Soln 50 MG/10ML: INTRAVENOUS | Qty: 30 | Status: AC

## 2023-01-19 MED FILL — Dexamethasone Sodium Phosphate Inj 100 MG/10ML: INTRAMUSCULAR | Qty: 1 | Status: AC

## 2023-01-19 MED FILL — Fosaprepitant Dimeglumine For IV Infusion 150 MG (Base Eq): INTRAVENOUS | Qty: 5 | Status: AC

## 2023-01-19 NOTE — Telephone Encounter (Signed)
stopped

## 2023-01-19 NOTE — Progress Notes (Signed)
Tamma Mandell [id number] 09-18-1965 57 y.o.    Subjective:   Patient ID:  Shannon [name] is a 57 y.o. (DOB 01-27-1966) female.  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  seen November 2020 .  No meds were changed at that time.  10/16/19 the following is noted at this appt: Asked questions about sleep supplement.   Just started GI doctor.   Good overall. No mood swings. No depressive episodes.  Cry more with hormonal changes more easily.   Probably sleep too much to avoid.  Occ stress from work interferes with sleep.  Not as productive from home.  Hopes to get back to the office.  Still working from home.  Boss noticed her forgetfulness with deadlines and missing issues in emails.  Used to be sharper. Wonders if related to meds.  Can usually nap if desired for years and not seasonal.  Only anxiety is Covid related. More poor self care.   Hysterectomy December 2020. No med changes  03/18/2020 appointment with the following noted: July had Covid and both parents and her father died from [place]. Able to help mom with arrangements. Required one iron infusion this year. Never took NAC bc it was too hard to take. Sleep, eating schedule is irregular. More productive at home helps her feel better. Plan: No med changes. 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 For a month had dizzy, falls, NV all of which cleared quickly after removal.  Radiation to brain after this. More CA in lungs and abdomen and having chemo.  Oncologist said she has about 5 years of lifespan left.  MRI and CT pending. Gotten into therapy starting  Jonny Ruiz Rodenbaugh next week.   Pray a lot and good connection with friends.  Good support.  Trying to do things for fun.   Is working but finding it difficult mentally and physically DT SE fatigue from chemo. Considering LT disability but $ concerns.   If anxious trouble falling asleep.   Works from home.   D 55 yo.   Plan: No med changes except added modafinil  11/26/2020 appointment with the following noted: Added modafinil and didn't notice much from it. Sleep average about 7 hours.  Would prefer 10 hours and may nap here and there in week of chemo even on chemo.  Had to get it at [facility] with [facility]. No dx of OSA. Last 2 weeks a little low emotionally and mentally after up for a couple of weeks with company and it wore her out. Sleepiness is normally worse than tiredness. Only situational mood effects. Plan: No benefit 200 mg modafinil nor SE therefore Increase Modafinil to 300 mg daily for excessive sleepiness  04/21/2021 appointment with the following noted: Still dealing with chemotx for colon cancer Thinks increase modafinil questionable but misses it more than takes it. No SE. Things are getting harder rough month with more depression and anxiety related to cancer and daughter.  Work is hard.  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  Missing deadlines.   Needs to call social security. D 55 yo and in therapy with Elio Forget.  So angry with patient. Has been able to stay on branded Seroquel. Tumors on CT scan  are shrinking recently. Plan: HX RELAPSE PSYCHOSIS WITH GENERIC QUETIAPINE, Continue branded Seroquel 800 mg HS and lamotrigine 150 mg daily and Luvox 300 mg daily Vraylar 1.5 mg daily for 2 weeks, Then if tolerated increase to 3 mg daily. If mood improves then reduce Seroquel to 1 and 1/2 tablets.  06/06/2021 appt noted: I like it.  More clear headed and better energy.  Missed a few bc out of samples. Reduced Seroquel 600 mg HS Less depressed.   Especially right after starting and others noticed the benefit. Plan : Continue lamotrigine 150 mg daily and Luvox 300 mg daily Vraylar 3 mg daily. Reduce Seroquel to 400 mg nightly for 2 weeks,  Then reduce Seroquel to 200 mg nightly for 2 weeks,  Then reduce to 100 mg for 2 weeks, Then reduce to 50 mg 2 weeks then stop if you can still sleep ok  08/07/2021 appointment with the following noted: Continues Vraylar 3 mg and Luvox 300 mg daily.  Reduced Seroquel to 200 mg nightly and takes Ambien 5 mg nightly Went slowly down with Seroquel. Doing pretty good.  Some night hard to go to sleep but usally ok.   Still please with Vraylar less sedated. No mood swings.  Some struggle with depression with reduced motivation and enjoyment.  Sometimes bored and would rather go to bed.  Don't want to read or watch TV.   Can follow a show but doesn't find it intersting.  CT scan show tumors shrinking. Plan: Continue lamotrigine 150 mg daily and Luvox 300 mg daily Reduce Vraylar 3 mg every other day to see If depression energy and interest and enjoyment is better Reduce Seroquel to 150 for 2 weeks Then reduce to 100 mg for 2 weeks, Then reduce to 50 mg 2 weeks then stop if you can still sleep ok Wants to have access to Ambien Plan: No benefit 200 mg modafinil nor SE therefore OK continue trial  Modafinil to 300 mg daily for excessive sleepiness  10/30/2021 appointment with the following noted: Reduced quetiapine to 100 mg and trouble sleeping so back to 150 mg HS Fine with reduced Vraylar to 3 mg QOD Increase modafinil to 300 mg and is more alert and more active. No SE Overall mood is ok but still likes to resto often but not sleeping as much.  Not sure if it is chemo related.  Will rest after activity. At night sleeps 10 hours. Still hard to enjoy things and part of reason will lay down bc is bored.   Changed TV cable and not watching much now.  Can't concentrate to read well.  Enjoys dog.  Enjoying some  projects at home.  Patient denies difficulty with sleep maintenance.   Denies appetite disturbance.  Patient reports that motivation have been good.  Patient denies any suicidal ideation.  Still too monotone. This is last week at work.  Not doing well at work DT concentration.  Will apply for disability. Ativan only every few weeks. Plan: Continue lamotrigine 150 mg daily  Continue Vraylar 3 mg every other day  Try again to Reduce Seroquel to 100 mg for 2 weeks, Then reduce to 50 mg 2 weeks then stop if you can still sleep ok Wants to have access to Ambien Reduce Luvox to 2 and 1/2 tablets Start Wellbutrin in the AM 1 for 1 week then 2 or 300 mg AM  12/22/21  appt noted: Reduced quetiapine to 50 and was OK but when stopped couldn't sleep.  Less daytime drowsy with less.   Reduced fluvoxamine to 250 and started Wellbutrin XL 300 mg AM. More even keel level and less down.  More content.  More steady.  Less teary and emotional. No SE problems with change. ? Modafinil in hopes of better concentration and clarity but ? Effect. Has some trouble with conc but it varies. No increase anxiety or jitteriness. Plan: Continue lamotrigine 150 mg daily  Continue Vraylar 3 mg every other day  Try again to Reduce Seroquel to 25-50 mg HS Wants to have access to Ambien Continue Luvox to 2 and 1/2 tablets Increase Wellbutrin to 450 mg AM residual depression anhedonia  03/11/2022 appointment noted Increase Wellbutrin to 450 mg AM residual depression anhedonia is partially better.  Not as heavy and cloudy.  Energy a little better until last 2 chemos wiped her out more with nausea for 6 days.  Chemo every 3 weeks. Enjoys her dog and helps her get up.   No SE with Wellbutrin.   Minimal anxiety usually.  Except a couple of days per week.  Anxiety over bills.   No sig obsessions.   Problems with daughter are stressful.  D drinks too much and sleeps all day and has social problems and irresponsibility. D Not  working.  She is 55 yo and very needy and childlike emotionally. Reduced Seroquel to 25 mg HS and willing to try to stop.  Avg 10 hour and some naps. But no longer hangover without much Seroquel. Has taken more Ambien. Plan: HX RELAPSE PSYCHOSIS WITH GENERIC QUETIAPINE, Continue lamotrigine 150 mg daily  Continue Vraylar 3 mg every other day  Try again to Reduce Seroquel if possible to 0 or  25 mg HS Wants to have access to Ambien Continue Luvox to 2 and 1/2 tablets Continue  Wellbutrin to 450 mg AM residual depression anhedonia OK continue Modafinil to 300 mg daily for excessive sleepiness and cancer related fatigue  10/15/22 appt noted: Meds as above and off Seroquel.  Rare Ambien.    No sig SE  Lately sleeping ok.  Mouth guard helped clenching of mouth.  Occ night of barely sleeping but better with mouth guard. Mood is ok . Anxiety high DT stressors.  Talking with friends family helps with stress.   Primary stressors CA and D 55 yo.   Disc sleep.   No sig obsessions.  No panic she can't handle.   Plan: Asks about trying to reduce some of the meds.  Disc options of fluvoxamine vs lamotrigine. Reduce lamotrigine to 1 of the 100 mg tablets for 1 month then reduce to 1/2 of the 100 mg for 2 weeks and if you feel ok stop it. Continue Vraylar 3 mg every other day  Off Seroquel Wants to have access to Ambien Continue Luvox to 2 and 1/2 tablets Continue  Wellbutrin to 450 mg AM residual depression anhedonia OK continue Modafinil to 300 mg daily for excessive sleepiness and cancer related fatigue  01/19/23 appt noted: Couldn't tell a difference off the lamotrigine. Meds: fluvox 250, lorazepam 0.5 rare, modafinil 300, Vraylar 3 mg every other day, Wellbutrin XL 450 AM, off lamotrigine. No Ambien Pretty good, stable, even keel.  Mild dep sx chronically.   No sig panic or anxity. Sleep variable.  Trouble staying asleep.  Naps an hour or more twice daily. 5-6 hours sleep at night.  Still feels  drowsy in the  day and even driving.   No SE  Past Psychiatric Medication Trials: HX RELAPSE PSYCHOSIS WITH GENERIC QUETIAPINE,  Seroquel 800, Risperdal 2, inVega 3, Abilify 15, Vraylar 3 Citalopram, fluoxetine 80, sertraline to 50, fluvoxamine 300 mg daily lamotrigine 200,  Modafinil 300 Ambien. No Lunesta,  Has been under the care of the psychiatrist since March 2000  D seeing Elio Forget  Review of Systems:  Review of Systems  Constitutional:  Positive for fatigue.  HENT:  Negative for rhinorrhea.   Eyes:  Negative for discharge.  Cardiovascular:  Negative for palpitations.  Gastrointestinal:  Positive for constipation. Negative for abdominal distention.  Neurological:  Positive for weakness. Negative for tremors.       No history of SZ  Psychiatric/Behavioral:  Positive for decreased concentration. Negative for dysphoric mood. The patient is nervous/anxious.     Medications: I have reviewed the patient's current medications.  Current Outpatient Medications  Medication Sig Dispense Refill   acetaminophen (TYLENOL) 325 MG tablet Take 2 tablets (650 mg total) by mouth every 6 (six) hours as needed for mild pain.     atorvastatin (LIPITOR) 10 MG tablet Take by mouth.     buPROPion (WELLBUTRIN XL) 150 MG 24 hr tablet TAKE 3 TABLETS BY MOUTH EVERY MORNING. 270 tablet 0   cariprazine (VRAYLAR) 3 MG capsule Take 1 capsule (3 mg total) by mouth daily. (Patient taking differently: Take 3 mg by mouth daily. Patient voiced taking other other day) 30 capsule 1   CONTOUR NEXT TEST test strip 3 (three) times daily.     Dulaglutide (TRULICITY) 3 MG/0.5ML SOPN Inject 3 mg into the skin once weekly. 6 mL 1   Dulaglutide (TRULICITY) 3 MG/0.5ML SOPN Inject 3 mg into the skin once a week. 6 mL 1   fluvoxaMINE (LUVOX) 100 MG tablet TAKE 3 TABLETS (300 MG TOTAL) BY MOUTH AT BEDTIME 90 tablet 0   hyoscyamine (LEVSIN SL) 0.125 MG SL tablet Place 1 tablet (0.125 mg total) under the tongue every 6  (six) hours as needed. 45 tablet 1   insulin aspart (NOVOLOG FLEXPEN) 100 UNIT/ML FlexPen Inject 8 Units into the skin 3 (three) times daily with meals. 15 mL 11   insulin glargine (LANTUS) 100 UNIT/ML Solostar Pen Inject 26 Units into the skin daily. 15 mL 11   levothyroxine (SYNTHROID) 75 MCG tablet Take 1 tablet (75 mcg total) by mouth daily. 30 tablet 0   loratadine (CLARITIN) 10 MG tablet Take 10 mg by mouth daily.     LORazepam (ATIVAN) 0.5 MG tablet Take 1 tablet (0.5 mg total) by mouth every 8 (eight) hours as needed for anxiety. 30 tablet 3   magic mouthwash (nystatin, lidocaine, diphenhydrAMINE, alum & mag hydroxide) suspension Swish and spit 5-10 mLs 3 (three) times daily. Do not eat or drink for 1 hour after using. 240 mL 0   magic mouthwash SOLN Take 10 mLs by mouth 3 (three) times daily as needed for mouth pain.     modafinil (PROVIGIL) 200 MG tablet TAKE 1 & 1/2 (ONE & ONE-HALF) TABLETS BY MOUTH ONCE DAILY 45 tablet 5   Multiple Vitamin (MULTI-VITAMIN) tablet Take 1 tablet by mouth daily.     NOVOFINE PEN NEEDLE 32G X 6 MM MISC SMARTSIG:Syringe(s) SUB-Q     omeprazole (PRILOSEC) 10 MG capsule Take 10 mg by mouth daily.     omeprazole (PRILOSEC) 20 MG capsule Take 20 mg by mouth daily.     ondansetron (ZOFRAN) 4 MG tablet Take 1  tablet (4 mg total) by mouth every 4 (four) hours as needed for nausea. 90 tablet 3   ondansetron (ZOFRAN-ODT) 4 MG disintegrating tablet Take 1 tablet (4 mg total) by mouth every 4 (four) hours as needed for nausea or vomiting. Dissolve on tongue 90 tablet 3   pantoprazole (PROTONIX) 40 MG tablet TAKE 1 TABLET BY MOUTH EVERYDAY AT BEDTIME 30 tablet 0   potassium chloride SA (KLOR-CON M) 20 MEQ tablet Take 1 tablet (20 mEq total) by mouth daily. 5 tablet 0   promethazine (PHENERGAN) 25 MG tablet Take 1 tablet (25 mg total) by mouth every 6 (six) hours as needed for nausea. 30 tablet 3   senna-docusate (SENOKOT-S) 8.6-50 MG tablet Take 2 tablets by mouth at  bedtime.     TRULICITY 3 MG/0.5ML SOPN Inject into the skin.     zolpidem (AMBIEN) 5 MG tablet Take 1 tablet (5 mg total) by mouth at bedtime as needed for sleep. (Patient not taking: Reported on 01/19/2023) 30 tablet 3   No current facility-administered medications for this visit.    Medication Side Effects: None  Allergies:  Allergies  Allergen Reactions   Leucovorin Hives, Rash and Other (See Comments)    Redness on face and neck.  Confirmed that it was the leucovorin causing this on 11/19/21.  We are deleting this from future plan.   Clindamycin/Lincomycin Rash   Irinotecan Rash   Penicillins Rash    Reaction: 10 years    Past Medical History:  Diagnosis Date   Anemia    Iron De   Anxiety    Blood clot in vein    Bowel obstruction (HCC)    Colon cancer (HCC) 2018   treated with surgery and chemotherapy   Depression    Diabetes mellitus without complication (HCC)    Type II   DVT (deep venous thrombosis) (HCC) 2019   behind right knee- while she wasa on chemo   GERD (gastroesophageal reflux disease)    History of blood transfusion    History of chemotherapy    History of kidney stones 2012   passed   Hypothyroidism    Obesity     Family History  Problem Relation Age of Onset   Irritable bowel syndrome Mother    Colon polyps Father    Breast cancer Maternal Grandmother    Diabetes Maternal Grandmother    Diabetes Maternal Grandfather    Breast cancer Paternal Grandmother    Colon polyps Maternal Uncle    Stomach cancer Neg Hx    Pancreatic cancer Neg Hx    Esophageal cancer Neg Hx    Colon cancer Neg Hx     Social History   Socioeconomic History   Marital status: Widowed    Spouse name: Not on file   Number of children: Not on file   Years of education: Not on file   Highest education level: Not on file  Occupational History   Occupation: care coordinator   Tobacco Use   Smoking status: Never   Smokeless tobacco: Never  Vaping Use   Vaping  status: Never Used  Substance and Sexual Activity   Alcohol use: Yes    Comment: rare   Drug use: Never   Sexual activity: Not Currently  Other Topics Concern   Not on file  Social History Narrative   Not on file   Social Determinants of Health   Financial Resource Strain: Not on file  Food Insecurity: Not on file  Transportation Needs:  Not on file  Physical Activity: Not on file  Stress: Not on file  Social Connections: Not on file  Intimate Partner Violence: Not on file    Past Medical History, Surgical history, Social history, and Family history were reviewed and updated as appropriate.   Please see review of systems for further details on the patient's review from today.   Objective:   Physical Exam:  LMP 02/22/2019 (Approximate)   Physical Exam Constitutional:      General: She is not in acute distress. Musculoskeletal:        General: No deformity.  Neurological:     Mental Status: She is alert and oriented to person, place, and time.     Coordination: Coordination normal.  Psychiatric:        Attention and Perception: Attention and perception normal. She does not perceive auditory or visual hallucinations.        Mood and Affect: Mood is anxious. Mood is not depressed. Affect is not labile, blunt or inappropriate.        Speech: Speech normal. Speech is not rapid and pressured.        Behavior: Behavior normal.        Thought Content: Thought content normal. Thought content is not paranoid or delusional. Thought content does not include homicidal or suicidal ideation.        Cognition and Memory: Cognition and memory normal.        Judgment: Judgment normal.     Comments: Insight intact Less  anhedonia and depression     Lab Review:     Component Value Date/Time   NA 144 01/18/2023 1400   NA 140 01/04/2023 0000   K 4.7 01/18/2023 1400   CL 106 01/18/2023 1400   CO2 24 01/18/2023 1400   GLUCOSE 123 (H) 01/18/2023 1400   BUN 17 01/18/2023 1400   BUN  13 01/04/2023 0000   CREATININE 1.09 (H) 01/18/2023 1400   CALCIUM 9.7 01/18/2023 1400   PROT 6.6 01/18/2023 1400   ALBUMIN 3.6 01/18/2023 1400   AST 25 01/18/2023 1400   ALT 20 01/18/2023 1400   ALKPHOS 126 01/18/2023 1400   BILITOT 0.5 01/18/2023 1400   GFRNONAA 60 (L) 01/18/2023 1400   GFRNONAA 52 10/23/2022 0000       Component Value Date/Time   WBC 3.7 01/18/2023 0000   WBC 3.5 (L) 12/07/2022 1602   WBC 4.8 06/07/2020 0525   RBC 3.47 (A) 01/18/2023 0000   HGB 12.1 01/18/2023 0000   HGB 11.9 (L) 12/07/2022 1602   HCT 36 01/18/2023 0000   PLT 106 (A) 01/18/2023 0000   PLT 72 (L) 12/07/2022 1602   MCV 101 (A) 01/04/2023 0000   MCH 32.6 12/07/2022 1602   MCHC 32.2 12/07/2022 1602   RDW 18.6 (H) 12/07/2022 1602   LYMPHSABS 1.0 12/07/2022 1602   MONOABS 0.7 12/07/2022 1602   EOSABS 0.0 12/07/2022 1602   BASOSABS 0.0 12/07/2022 1602    No results found for: "POCLITH", "LITHIUM"   No results found for: "PHENYTOIN", "PHENOBARB", "VALPROATE", "CBMZ"   .res Assessment: Plan:    Severe recurrent major depression with psychotic features (HCC)  Mixed obsessional thoughts and acts  Delayed sleep phase syndrome  Mild cognitive impairment  Neoplastic malignant related fatigue   Overall mood and anxiety are well managed.  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 In general has handled this well except some trouble going to  sleep on occ with anxiety.  Partially better with the increase in Wellbutrin to 450 mg daily.  Disc EFA, trial trazodone 50-100 mg HS Option Lunesta alternative  Discussed potential metabolic side effects associated with atypical antipsychotics, as well as potential risk for movement side effects. Advised pt to contact office if movement side effects occur.   Continue Vraylar 3 mg every other day   Wants to have access to Ambien but doesn't keep her asleep.  Continue Luvox to 2 and 1/2  tablets Continue  Wellbutrin to 450 mg AM residual depression anhedonia OK continue Modafinil to 300 mg daily for excessive sleepiness and cancer related fatigue  Disc polypharmacy and will try to eliminate some meds if depression improves.  Option reduce fluvox at follow up.   Disc problems with D and how to intervene getting help for D's mental health and drinking problems.  FU 3-4  mos  Meredith Staggers, MD, DFAPA   Please see After Visit Summary for patient specific instructions.  Future Appointments  Date Time Provider Department Center  01/20/2023  9:30 AM [facility] INFUSION CHAIR 1 [facility] None  01/22/2023  2:00 PM [facility] BED 1 [facility] None  01/25/2023  2:30 PM [facility] INFUSION CHAIR 8 [facility] None  01/26/2023  2:30 PM [facility] INFUSION CHAIR 8 [facility] None  01/27/2023  2:30 PM [facility] INFUSION CHAIR 8 [facility] None  01/29/2023  1:30 PM [facility] None  01/29/2023  2:00 PM Lewis, Dequincy A, MD [facility] None  02/03/2023  9:30 AM [facility] INFUSION CHAIR 4 [facility] None  02/05/2023  2:00 PM [facility] INFUSION CHAIR 6 [facility] None  02/16/2023 11:30 AM Vaslow, Georgeanna Lea, MD [facility] None    No orders of the defined types were placed in this encounter.      -------------------------------

## 2023-01-20 ENCOUNTER — Other Ambulatory Visit: Payer: Self-pay

## 2023-01-20 ENCOUNTER — Inpatient Hospital Stay: Payer: 59

## 2023-01-20 VITALS — BP 120/80 | HR 97 | Temp 97.9°F | Resp 16 | Ht 68.0 in | Wt 156.8 lb

## 2023-01-20 DIAGNOSIS — C7802 Secondary malignant neoplasm of left lung: Secondary | ICD-10-CM | POA: Diagnosis not present

## 2023-01-20 DIAGNOSIS — C786 Secondary malignant neoplasm of retroperitoneum and peritoneum: Secondary | ICD-10-CM | POA: Diagnosis not present

## 2023-01-20 DIAGNOSIS — D696 Thrombocytopenia, unspecified: Secondary | ICD-10-CM | POA: Diagnosis not present

## 2023-01-20 DIAGNOSIS — Z5189 Encounter for other specified aftercare: Secondary | ICD-10-CM | POA: Diagnosis not present

## 2023-01-20 DIAGNOSIS — D709 Neutropenia, unspecified: Secondary | ICD-10-CM | POA: Diagnosis not present

## 2023-01-20 DIAGNOSIS — C7800 Secondary malignant neoplasm of unspecified lung: Secondary | ICD-10-CM

## 2023-01-20 DIAGNOSIS — Z5111 Encounter for antineoplastic chemotherapy: Secondary | ICD-10-CM | POA: Diagnosis not present

## 2023-01-20 DIAGNOSIS — C7931 Secondary malignant neoplasm of brain: Secondary | ICD-10-CM | POA: Diagnosis not present

## 2023-01-20 DIAGNOSIS — C7801 Secondary malignant neoplasm of right lung: Secondary | ICD-10-CM | POA: Diagnosis not present

## 2023-01-20 DIAGNOSIS — C189 Malignant neoplasm of colon, unspecified: Secondary | ICD-10-CM

## 2023-01-20 DIAGNOSIS — C7971 Secondary malignant neoplasm of right adrenal gland: Secondary | ICD-10-CM | POA: Diagnosis not present

## 2023-01-20 DIAGNOSIS — C182 Malignant neoplasm of ascending colon: Secondary | ICD-10-CM

## 2023-01-20 DIAGNOSIS — C787 Secondary malignant neoplasm of liver and intrahepatic bile duct: Secondary | ICD-10-CM | POA: Diagnosis not present

## 2023-01-20 DIAGNOSIS — E876 Hypokalemia: Secondary | ICD-10-CM | POA: Diagnosis not present

## 2023-01-20 MED ORDER — SODIUM CHLORIDE 0.9 % IV SOLN
10.0000 mg | Freq: Once | INTRAVENOUS | Status: AC
  Administered 2023-01-20: 10 mg via INTRAVENOUS
  Filled 2023-01-20: qty 10

## 2023-01-20 MED ORDER — SODIUM CHLORIDE 0.9 % IV SOLN
150.0000 mg | Freq: Once | INTRAVENOUS | Status: AC
  Administered 2023-01-20: 150 mg via INTRAVENOUS
  Filled 2023-01-20: qty 150

## 2023-01-20 MED ORDER — DIPHENHYDRAMINE HCL 50 MG/ML IJ SOLN
25.0000 mg | Freq: Once | INTRAMUSCULAR | Status: AC
  Administered 2023-01-20: 25 mg via INTRAVENOUS
  Filled 2023-01-20: qty 1

## 2023-01-20 MED ORDER — OXALIPLATIN CHEMO INJECTION 50 MG/10ML
85.0000 mg/m2 | Freq: Once | INTRAVENOUS | Status: AC
  Administered 2023-01-20: 150 mg via INTRAVENOUS
  Filled 2023-01-20: qty 20

## 2023-01-20 MED ORDER — PALONOSETRON HCL INJECTION 0.25 MG/5ML
0.2500 mg | Freq: Once | INTRAVENOUS | Status: AC
  Administered 2023-01-20: 0.25 mg via INTRAVENOUS
  Filled 2023-01-20: qty 5

## 2023-01-20 MED ORDER — SODIUM CHLORIDE 0.9 % IV SOLN
2400.0000 mg/m2 | INTRAVENOUS | Status: DC
  Administered 2023-01-20: 4350 mg via INTRAVENOUS
  Filled 2023-01-20: qty 87

## 2023-01-20 MED ORDER — FAMOTIDINE IN NACL 20-0.9 MG/50ML-% IV SOLN
20.0000 mg | Freq: Once | INTRAVENOUS | Status: AC
  Administered 2023-01-20: 20 mg via INTRAVENOUS
  Filled 2023-01-20: qty 50

## 2023-01-20 MED ORDER — FLUOROURACIL CHEMO INJECTION 2.5 GM/50ML
400.0000 mg/m2 | Freq: Once | INTRAVENOUS | Status: AC
  Administered 2023-01-20: 700 mg via INTRAVENOUS
  Filled 2023-01-20: qty 14

## 2023-01-20 MED ORDER — DEXTROSE 5 % IV SOLN: Freq: Once | INTRAVENOUS | Status: AC

## 2023-01-20 MED ORDER — SODIUM CHLORIDE 0.9 % IV SOLN
5.0000 mg/kg | Freq: Once | INTRAVENOUS | Status: AC
  Administered 2023-01-20: 350 mg via INTRAVENOUS
  Filled 2023-01-20: qty 14

## 2023-01-20 MED ORDER — SODIUM CHLORIDE 0.9 % IV SOLN: Freq: Once | INTRAVENOUS | Status: AC

## 2023-01-20 NOTE — Patient Instructions (Signed)
[facility]  Discharge Instructions: Thank you for choosing [facility] to provide your oncology and hematology care.  If you have a lab appointment with the Cancer Center, please go directly to the Cancer Center and check in at the registration area.   Wear comfortable clothing and clothing appropriate for easy access to any Portacath or PICC line.   We strive to give you quality time with your provider. You may need to reschedule your appointment if you arrive late (15 or more minutes).  Arriving late affects you and other patients whose appointments are after yours.  Also, if you miss three or more appointments without notifying the office, you may be dismissed from the clinic at the provider's discretion.      For prescription refill requests, have your pharmacy contact our office and allow 72 hours for refills to be completed.    Today you received the following chemotherapy and/or immunotherapy agents Oxaliplatin, Bevacizumab, 50fu push/pump       To help prevent nausea and vomiting after your treatment, we encourage you to take your nausea medication as directed.  BELOW ARE SYMPTOMS THAT SHOULD BE REPORTED IMMEDIATELY: *FEVER GREATER THAN 100.4 F (38 C) OR HIGHER *CHILLS OR SWEATING *NAUSEA AND VOMITING THAT IS NOT CONTROLLED WITH YOUR NAUSEA MEDICATION *UNUSUAL SHORTNESS OF BREATH *UNUSUAL BRUISING OR BLEEDING *URINARY PROBLEMS (pain or burning when urinating, or frequent urination) *BOWEL PROBLEMS (unusual diarrhea, constipation, pain near the anus) TENDERNESS IN MOUTH AND THROAT WITH OR WITHOUT PRESENCE OF ULCERS (sore throat, sores in mouth, or a toothache) UNUSUAL RASH, SWELLING OR PAIN  UNUSUAL VAGINAL DISCHARGE OR ITCHING   Items with * indicate a potential emergency and should be followed up as soon as possible or go to the Emergency Department if any problems should occur.  Please show the CHEMOTHERAPY ALERT CARD or IMMUNOTHERAPY  ALERT CARD at check-in to the Emergency Department and triage nurse.  Should you have questions after your visit or need to cancel or reschedule your appointment, please contact [facility]  Dept: [phone]  and follow the prompts.  Office hours are 8:00 a.m. to 4:30 p.m. Monday - Friday. Please note that voicemails left after 4:00 p.m. may not be returned until the following business day.  We are closed weekends and major holidays. You have access to a nurse at all times for urgent questions. Please call the main number to the clinic Dept: [phone] and follow the prompts.  For any non-urgent questions, you may also contact your provider using MyChart. We now offer e-Visits for anyone 45 and older to request care online for non-urgent symptoms. For details visit [email or web address].   Also download the MyChart app! Go to the app store, search "MyChart", open the app, select [facility], and log in with your MyChart username and password.

## 2023-01-22 ENCOUNTER — Inpatient Hospital Stay: Payer: 59

## 2023-01-22 ENCOUNTER — Other Ambulatory Visit: Payer: Self-pay | Admitting: Hematology and Oncology

## 2023-01-22 VITALS — BP 87/65 | HR 91 | Temp 98.0°F | Resp 18 | Ht 68.0 in | Wt 160.0 lb

## 2023-01-22 DIAGNOSIS — Z5111 Encounter for antineoplastic chemotherapy: Secondary | ICD-10-CM | POA: Diagnosis not present

## 2023-01-22 DIAGNOSIS — E876 Hypokalemia: Secondary | ICD-10-CM | POA: Diagnosis not present

## 2023-01-22 DIAGNOSIS — C189 Malignant neoplasm of colon, unspecified: Secondary | ICD-10-CM | POA: Diagnosis not present

## 2023-01-22 DIAGNOSIS — C182 Malignant neoplasm of ascending colon: Secondary | ICD-10-CM

## 2023-01-22 DIAGNOSIS — Z5189 Encounter for other specified aftercare: Secondary | ICD-10-CM | POA: Diagnosis not present

## 2023-01-22 DIAGNOSIS — C7801 Secondary malignant neoplasm of right lung: Secondary | ICD-10-CM | POA: Diagnosis not present

## 2023-01-22 DIAGNOSIS — C786 Secondary malignant neoplasm of retroperitoneum and peritoneum: Secondary | ICD-10-CM | POA: Diagnosis not present

## 2023-01-22 DIAGNOSIS — D696 Thrombocytopenia, unspecified: Secondary | ICD-10-CM | POA: Diagnosis not present

## 2023-01-22 DIAGNOSIS — C787 Secondary malignant neoplasm of liver and intrahepatic bile duct: Secondary | ICD-10-CM | POA: Diagnosis not present

## 2023-01-22 DIAGNOSIS — C7802 Secondary malignant neoplasm of left lung: Secondary | ICD-10-CM | POA: Diagnosis not present

## 2023-01-22 DIAGNOSIS — D709 Neutropenia, unspecified: Secondary | ICD-10-CM | POA: Diagnosis not present

## 2023-01-22 DIAGNOSIS — C7931 Secondary malignant neoplasm of brain: Secondary | ICD-10-CM | POA: Diagnosis not present

## 2023-01-22 DIAGNOSIS — C7971 Secondary malignant neoplasm of right adrenal gland: Secondary | ICD-10-CM | POA: Diagnosis not present

## 2023-01-22 DIAGNOSIS — E860 Dehydration: Secondary | ICD-10-CM

## 2023-01-22 MED ORDER — SODIUM CHLORIDE 0.9 % IV SOLN: Freq: Once | INTRAVENOUS | Status: AC

## 2023-01-22 MED ORDER — HEPARIN SOD (PORK) LOCK FLUSH 100 UNIT/ML IV SOLN
500.0000 [IU] | Freq: Once | INTRAVENOUS | Status: AC | PRN
  Administered 2023-01-22: 500 [IU]

## 2023-01-22 MED ORDER — SODIUM CHLORIDE 0.9% FLUSH
10.0000 mL | INTRAVENOUS | Status: DC | PRN
  Administered 2023-01-22: 10 mL

## 2023-01-22 NOTE — Patient Instructions (Signed)
Managing Chemotherapy Side Effects, Adult Chemotherapy is a treatment that uses medicine to kill cancer cells. However, in addition to killing cancer cells, the medicines can also damage healthy cells. The damage to healthy cells can lead to side effects. The exact side effects depend on the specific medicines used. Most of the side effects of chemotherapy go away once treatment is finished. Until then, work closely with your health care providers and take an active role in managing your side effects. What are common side effects of chemotherapy? Increased risk of infection, bruising, or bleeding. Nausea and vomiting. Constipation or diarrhea. Loss of appetite. Hair loss. Mouth or throat sores. Tiredness (fatigue). Tingling, pain, or numbness in the hands and feet. Dry, sensitive, itchy, or sore skin. Sleep disturbances, such as excessive sleepiness. Confusion, anxiety, or mood swings. Memory changes. How to manage the side effects of chemotherapy Medicines Take over-the-counter and prescription medicines only as told by your health care provider. Talk with your health care provider before taking vitamins, herbs, supplements, or over-the-counter medicines. Some of these can interfere with chemotherapy. Activity Get plenty of rest. Get regular exercise by doing activities such as walking, gentle yoga, or tai chi. Return to your normal activities as told by your health care provider. Ask your health care provider what activities are safe for you. Eating and drinking  Talk to a dietitian about what you should eat and drink during cancer treatment. Drink enough fluid to keep your urine pale yellow. If you have side effects that affect eating, these tips may help: Eat smaller meals and snacks often. Drink high-nutrition and high-calorie shakes or supplements. Choose bland and soft foods that are easy to eat. Do not eat foods that are hot, spicy, or hard to swallow. Do not eat raw or  undercooked meat, eggs, or seafood. Always wash fresh fruits and vegetables well before eating them. Skin care If you have sore or itchy skin: Wear soft, comfortable clothing. Apply creams and ointments to your skin as told by your health care provider. If you lose your hair, consider wearing a wig, hat, or scarf to cover your head. You may want to have someone shave your head as you start to lose hair. During outdoor activities, protect your head and skin from the sun by using sunscreen with an SPF of 30 or higher or by wearing protective clothing and a hat. Meet with a hair and skin care specialist for makeup and skin care tips. Apply sunscreen to your scalp as told by your health care provider. General tips Learn as much as you can about your condition. If you are struggling emotionally, talk with a mental health care provider or join a support group. Keep all follow-up visits. This is important. How to prevent infection and bleeding Chemotherapy may lower your blood counts and put you at risk for infection and bleeding. Here are some ways to help prevent problems. Vaccines Talk to your health care provider about vaccines. You should not get any live vaccines, such as the polio, MMR, chickenpox, and shingles vaccines until your health care provider says that it is safe to do so. Do not be around people who have had live vaccines for as long as your health care provider recommends. Make sure you get a yearly flu shot. People who will be near you should also get a yearly flu shot. Social activity Stay away from crowded places where you could be exposed to germs. Do not be around people who may be sick or   people who have symptoms of a fever until they have been fever-free for at least 24 hours. Do not share food, cups, straws, or utensils with other people. Wear a mask when outside the home if your blood counts are low. Cleanliness  Wash your hands often for at least 20 seconds. Also make  sure that other members of your household wash their hands often. Brush your teeth twice daily using a soft toothbrush. Use mouth rinse only as told by your health care provider. Take a bath or shower daily unless your health care provider gives different instructions. General tips Take your temperature regularly, especially if you have chills or feel warm. Check with your health care provider: Before you travel. Before you have a dental procedure. Before you use a swimming pool, hot tub, or swim in a lake or ocean. If you get chemotherapy through an IV or port, check the site every day for signs of infection. Check for redness, swelling, pain, fluid, and warmth. Avoid activities that put you at risk for injury or bleeding. Use an electric razor to shave instead of a blade. Questions to ask your health care provider What are the most common side effects of my treatment? How will they affect my daily life? What can I do to manage them? What are some possible long-term side effects? What are possible complications? What support services are available? What number can I call with questions or concerns? Where to find support Cancer affects the entire family. Find out what family support resources are available from your cancer treatment center. For more support, turn to: Your cancer care team. Friends and family. Your religious community. Other people with cancer. Community-based or online support groups. Where to find more information National Cancer Institute: www.cancer.gov American [employer] Society: www.cancer.org Contact a health care provider if: You bleed or bruise more often. You notice blood in your urine or stool. You have any of these symptoms: A skin rash, or dry or itchy skin. A headache or stiff neck. Cold or flu symptoms. A cough. Persistent nausea or vomiting. Persistent diarrhea. Frequent urination, burning when passing urine, or foul-smelling urine. You cannot eat  because of mouth or throat pain. You are sad, confused, anxious, or depressed. Get help right away if: You have any of these symptoms: A fever or chills. Your health care provider should know about this right away. Redness, swelling, pain, fluid, or warmth near an IV site. Bleeding that you cannot stop. A seizure. You cannot swallow. You have chest pain. You have trouble breathing. A family member or caregiver should get help right away if you have a sudden or unusual change in behavior. These symptoms may be an emergency. Get help right away. Call 911. Do not wait to see if the symptoms will go away. Do not drive yourself to the hospital. Summary Chemotherapy is a treatment that uses medicine to kill cancer cells and can cause side effects. The specific side effects depend on the specific medicines used. Learn as much as you can about your condition. Ask about side effects to watch for and how to treat them. Seek out support and resources from others. Find out what family support resources are available from your cancer treatment center. Let your health care provider know if you notice any new, unusual, or worsening symptoms, especially fever or chills. This information is not intended to replace advice given to you by your health care provider. Make sure you discuss any questions you have with your health   care provider. Document Revised: 05/08/2021 Document Reviewed: 05/08/2021 Elsevier Patient Education  2024 [employer]  

## 2023-01-25 ENCOUNTER — Inpatient Hospital Stay: Payer: 59

## 2023-01-25 VITALS — BP 100/69 | HR 118 | Temp 98.3°F | Resp 20 | Ht 68.0 in | Wt 163.0 lb

## 2023-01-25 DIAGNOSIS — Z5111 Encounter for antineoplastic chemotherapy: Secondary | ICD-10-CM | POA: Diagnosis not present

## 2023-01-25 DIAGNOSIS — Z5189 Encounter for other specified aftercare: Secondary | ICD-10-CM | POA: Diagnosis not present

## 2023-01-25 DIAGNOSIS — C787 Secondary malignant neoplasm of liver and intrahepatic bile duct: Secondary | ICD-10-CM

## 2023-01-25 DIAGNOSIS — C189 Malignant neoplasm of colon, unspecified: Secondary | ICD-10-CM | POA: Diagnosis not present

## 2023-01-25 DIAGNOSIS — E876 Hypokalemia: Secondary | ICD-10-CM | POA: Diagnosis not present

## 2023-01-25 DIAGNOSIS — C182 Malignant neoplasm of ascending colon: Secondary | ICD-10-CM

## 2023-01-25 DIAGNOSIS — C786 Secondary malignant neoplasm of retroperitoneum and peritoneum: Secondary | ICD-10-CM | POA: Diagnosis not present

## 2023-01-25 DIAGNOSIS — C7801 Secondary malignant neoplasm of right lung: Secondary | ICD-10-CM | POA: Diagnosis not present

## 2023-01-25 DIAGNOSIS — C7971 Secondary malignant neoplasm of right adrenal gland: Secondary | ICD-10-CM | POA: Diagnosis not present

## 2023-01-25 DIAGNOSIS — D696 Thrombocytopenia, unspecified: Secondary | ICD-10-CM | POA: Diagnosis not present

## 2023-01-25 DIAGNOSIS — D709 Neutropenia, unspecified: Secondary | ICD-10-CM | POA: Diagnosis not present

## 2023-01-25 DIAGNOSIS — C7802 Secondary malignant neoplasm of left lung: Secondary | ICD-10-CM | POA: Diagnosis not present

## 2023-01-25 DIAGNOSIS — C7931 Secondary malignant neoplasm of brain: Secondary | ICD-10-CM | POA: Diagnosis not present

## 2023-01-25 MED ORDER — FILGRASTIM-SNDZ 480 MCG/0.8ML IJ SOSY
480.0000 ug | PREFILLED_SYRINGE | Freq: Once | INTRAMUSCULAR | Status: AC
  Administered 2023-01-25: 480 ug via SUBCUTANEOUS
  Filled 2023-01-25: qty 0.8

## 2023-01-25 NOTE — Patient Instructions (Signed)
Filgrastim Injection What is this medication? FILGRASTIM (fil GRA stim) lowers the risk of infection in people who are receiving chemotherapy. It works by helping your body make more white blood cells, which protects your body from infection. It may also be used to help people who have been exposed to high doses of radiation. It can be used to help prepare your body before a stem cell transplant. It works by helping your bone marrow make and release stem cells into the blood. This medicine may be used for other purposes; ask your health care provider or pharmacist if you have questions. COMMON BRAND NAME(S): Neupogen, Nivestym, Releuko, Zarxio What should I tell my care team before I take this medication? They need to know if you have any of these conditions: History of blood diseases, such as sickle cell anemia Kidney disease Recent or ongoing radiation An unusual or allergic reaction to filgrastim, pegfilgrastim, latex, rubber, other medications, foods, dyes, or preservatives Pregnant or trying to get pregnant Breast-feeding How should I use this medication? This medication is injected under the skin or into a vein. It is usually given by your care team in a hospital or clinic setting. It may be given at home. If you get this medication at home, you will be taught how to prepare and give it. Use exactly as directed. Take it as directed on the prescription label at the same time every day. Keep taking it unless your care team tells you to stop. It is important that you put your used needles and syringes in a special sharps container. Do not put them in a trash can. If you do not have a sharps container, call your pharmacist or care team to get one. This medication comes with INSTRUCTIONS FOR USE. Ask your pharmacist for directions on how to use this medication. Read the information carefully. Talk to your pharmacist or care team if you have questions. Talk to your care team about the use of this  medication in children. While it may be prescribed for children for selected conditions, precautions do apply. Overdosage: If you think you have taken too much of this medicine contact a poison control center or emergency room at once. NOTE: This medicine is only for you. Do not share this medicine with others. What if I miss a dose? It is important not to miss any doses. Talk to your care team about what to do if you miss a dose. What may interact with this medication? Medications that may cause a release of neutrophils, such as lithium This list may not describe all possible interactions. Give your health care provider a list of all the medicines, herbs, non-prescription drugs, or dietary supplements you use. Also tell them if you smoke, drink alcohol, or use illegal drugs. Some items may interact with your medicine. What should I watch for while using this medication? Your condition will be monitored carefully while you are receiving this medication. You may need bloodwork while taking this medication. Talk to your care team about your risk of cancer. You may be more at risk for certain types of cancer if you take this medication. What side effects may I notice from receiving this medication? Side effects that you should report to your care team as soon as possible: Allergic reactions--skin rash, itching, hives, swelling of the face, lips, tongue, or throat Capillary leak syndrome--stomach or muscle pain, unusual weakness or fatigue, feeling faint or lightheaded, decrease in the amount of urine, swelling of the ankles, hands, or   feet, trouble breathing High white blood cell level--fever, fatigue, trouble breathing, night sweats, change in vision, weight loss Inflammation of the aorta--fever, fatigue, back, chest, or stomach pain, severe headache Kidney injury (glomerulonephritis)--decrease in the amount of urine, red or dark brown urine, foamy or bubbly urine, swelling of the ankles, hands, or  feet Shortness of breath or trouble breathing Spleen injury--pain in upper left stomach or shoulder Unusual bruising or bleeding Side effects that usually do not require medical attention (report to your care team if they continue or are bothersome): Back pain Bone pain Fatigue Fever Headache Nausea This list may not describe all possible side effects. Call your doctor for medical advice about side effects. You may report side effects to FDA at 1-800-FDA-1088. Where should I keep my medication? Keep out of the reach of children and pets. Keep this medication in the original packaging until you are ready to take it. Protect from light. See product for storage information. Each product may have different instructions. Get rid of any unused medication after the expiration date. To get rid of medications that are no longer needed or have expired: Take the medication to a medications take-back program. Check with your pharmacy or law enforcement to find a location. If you cannot return the medication, ask your pharmacist or care team how to get rid of this medication safely. NOTE: This sheet is a summary. It may not cover all possible information. If you have questions about this medicine, talk to your doctor, pharmacist, or health care provider.  2024 Elsevier/Gold Standard (2021-10-09 00:00:00)  

## 2023-01-26 ENCOUNTER — Inpatient Hospital Stay: Payer: 59

## 2023-01-26 VITALS — BP 98/67 | HR 96 | Temp 98.2°F | Resp 12 | Ht 68.0 in | Wt 159.1 lb

## 2023-01-26 DIAGNOSIS — D696 Thrombocytopenia, unspecified: Secondary | ICD-10-CM | POA: Diagnosis not present

## 2023-01-26 DIAGNOSIS — C787 Secondary malignant neoplasm of liver and intrahepatic bile duct: Secondary | ICD-10-CM | POA: Diagnosis not present

## 2023-01-26 DIAGNOSIS — C7801 Secondary malignant neoplasm of right lung: Secondary | ICD-10-CM | POA: Diagnosis not present

## 2023-01-26 DIAGNOSIS — Z5189 Encounter for other specified aftercare: Secondary | ICD-10-CM | POA: Diagnosis not present

## 2023-01-26 DIAGNOSIS — C189 Malignant neoplasm of colon, unspecified: Secondary | ICD-10-CM

## 2023-01-26 DIAGNOSIS — C7802 Secondary malignant neoplasm of left lung: Secondary | ICD-10-CM | POA: Diagnosis not present

## 2023-01-26 DIAGNOSIS — C182 Malignant neoplasm of ascending colon: Secondary | ICD-10-CM

## 2023-01-26 DIAGNOSIS — C7931 Secondary malignant neoplasm of brain: Secondary | ICD-10-CM | POA: Diagnosis not present

## 2023-01-26 DIAGNOSIS — C7971 Secondary malignant neoplasm of right adrenal gland: Secondary | ICD-10-CM | POA: Diagnosis not present

## 2023-01-26 DIAGNOSIS — E876 Hypokalemia: Secondary | ICD-10-CM | POA: Diagnosis not present

## 2023-01-26 DIAGNOSIS — D709 Neutropenia, unspecified: Secondary | ICD-10-CM | POA: Diagnosis not present

## 2023-01-26 DIAGNOSIS — C786 Secondary malignant neoplasm of retroperitoneum and peritoneum: Secondary | ICD-10-CM | POA: Diagnosis not present

## 2023-01-26 DIAGNOSIS — Z5111 Encounter for antineoplastic chemotherapy: Secondary | ICD-10-CM | POA: Diagnosis not present

## 2023-01-26 MED ORDER — FILGRASTIM-SNDZ 480 MCG/0.8ML IJ SOSY
480.0000 ug | PREFILLED_SYRINGE | Freq: Once | INTRAMUSCULAR | Status: AC
  Administered 2023-01-26: 480 ug via SUBCUTANEOUS
  Filled 2023-01-26: qty 0.8

## 2023-01-27 ENCOUNTER — Inpatient Hospital Stay: Payer: 59

## 2023-01-27 ENCOUNTER — Encounter: Payer: Self-pay | Admitting: Oncology

## 2023-01-27 VITALS — BP 108/73 | HR 93 | Temp 97.9°F | Resp 16 | Wt 159.1 lb

## 2023-01-27 DIAGNOSIS — D709 Neutropenia, unspecified: Secondary | ICD-10-CM | POA: Diagnosis not present

## 2023-01-27 DIAGNOSIS — C786 Secondary malignant neoplasm of retroperitoneum and peritoneum: Secondary | ICD-10-CM | POA: Diagnosis not present

## 2023-01-27 DIAGNOSIS — C7931 Secondary malignant neoplasm of brain: Secondary | ICD-10-CM | POA: Diagnosis not present

## 2023-01-27 DIAGNOSIS — C787 Secondary malignant neoplasm of liver and intrahepatic bile duct: Secondary | ICD-10-CM | POA: Diagnosis not present

## 2023-01-27 DIAGNOSIS — C7800 Secondary malignant neoplasm of unspecified lung: Secondary | ICD-10-CM

## 2023-01-27 DIAGNOSIS — E876 Hypokalemia: Secondary | ICD-10-CM | POA: Diagnosis not present

## 2023-01-27 DIAGNOSIS — Z5111 Encounter for antineoplastic chemotherapy: Secondary | ICD-10-CM | POA: Diagnosis not present

## 2023-01-27 DIAGNOSIS — C7971 Secondary malignant neoplasm of right adrenal gland: Secondary | ICD-10-CM | POA: Diagnosis not present

## 2023-01-27 DIAGNOSIS — C182 Malignant neoplasm of ascending colon: Secondary | ICD-10-CM

## 2023-01-27 DIAGNOSIS — C189 Malignant neoplasm of colon, unspecified: Secondary | ICD-10-CM

## 2023-01-27 DIAGNOSIS — C7801 Secondary malignant neoplasm of right lung: Secondary | ICD-10-CM | POA: Diagnosis not present

## 2023-01-27 DIAGNOSIS — C7802 Secondary malignant neoplasm of left lung: Secondary | ICD-10-CM | POA: Diagnosis not present

## 2023-01-27 DIAGNOSIS — D696 Thrombocytopenia, unspecified: Secondary | ICD-10-CM | POA: Diagnosis not present

## 2023-01-27 DIAGNOSIS — Z5189 Encounter for other specified aftercare: Secondary | ICD-10-CM | POA: Diagnosis not present

## 2023-01-27 MED ORDER — FILGRASTIM-SNDZ 480 MCG/0.8ML IJ SOSY
480.0000 ug | PREFILLED_SYRINGE | Freq: Once | INTRAMUSCULAR | Status: AC
  Administered 2023-01-27: 480 ug via SUBCUTANEOUS
  Filled 2023-01-27: qty 0.8

## 2023-01-27 NOTE — Patient Instructions (Signed)
Filgrastim Injection What is this medication? FILGRASTIM (fil GRA stim) lowers the risk of infection in people who are receiving chemotherapy. It works by helping your body make more white blood cells, which protects your body from infection. It may also be used to help people who have been exposed to high doses of radiation. It can be used to help prepare your body before a stem cell transplant. It works by helping your bone marrow make and release stem cells into the blood. This medicine may be used for other purposes; ask your health care provider or pharmacist if you have questions. COMMON BRAND NAME(S): Neupogen, Nivestym, Releuko, Zarxio What should I tell my care team before I take this medication? They need to know if you have any of these conditions: History of blood diseases, such as sickle cell anemia Kidney disease Recent or ongoing radiation An unusual or allergic reaction to filgrastim, pegfilgrastim, latex, rubber, other medications, foods, dyes, or preservatives Pregnant or trying to get pregnant Breast-feeding How should I use this medication? This medication is injected under the skin or into a vein. It is usually given by your care team in a hospital or clinic setting. It may be given at home. If you get this medication at home, you will be taught how to prepare and give it. Use exactly as directed. Take it as directed on the prescription label at the same time every day. Keep taking it unless your care team tells you to stop. It is important that you put your used needles and syringes in a special sharps container. Do not put them in a trash can. If you do not have a sharps container, call your pharmacist or care team to get one. This medication comes with INSTRUCTIONS FOR USE. Ask your pharmacist for directions on how to use this medication. Read the information carefully. Talk to your pharmacist or care team if you have questions. Talk to your care team about the use of this  medication in children. While it may be prescribed for children for selected conditions, precautions do apply. Overdosage: If you think you have taken too much of this medicine contact a poison control center or emergency room at once. NOTE: This medicine is only for you. Do not share this medicine with others. What if I miss a dose? It is important not to miss any doses. Talk to your care team about what to do if you miss a dose. What may interact with this medication? Medications that may cause a release of neutrophils, such as lithium This list may not describe all possible interactions. Give your health care provider a list of all the medicines, herbs, non-prescription drugs, or dietary supplements you use. Also tell them if you smoke, drink alcohol, or use illegal drugs. Some items may interact with your medicine. What should I watch for while using this medication? Your condition will be monitored carefully while you are receiving this medication. You may need bloodwork while taking this medication. Talk to your care team about your risk of cancer. You may be more at risk for certain types of cancer if you take this medication. What side effects may I notice from receiving this medication? Side effects that you should report to your care team as soon as possible: Allergic reactions--skin rash, itching, hives, swelling of the face, lips, tongue, or throat Capillary leak syndrome--stomach or muscle pain, unusual weakness or fatigue, feeling faint or lightheaded, decrease in the amount of urine, swelling of the ankles, hands, or   feet, trouble breathing High white blood cell level--fever, fatigue, trouble breathing, night sweats, change in vision, weight loss Inflammation of the aorta--fever, fatigue, back, chest, or stomach pain, severe headache Kidney injury (glomerulonephritis)--decrease in the amount of urine, red or dark brown urine, foamy or bubbly urine, swelling of the ankles, hands, or  feet Shortness of breath or trouble breathing Spleen injury--pain in upper left stomach or shoulder Unusual bruising or bleeding Side effects that usually do not require medical attention (report to your care team if they continue or are bothersome): Back pain Bone pain Fatigue Fever Headache Nausea This list may not describe all possible side effects. Call your doctor for medical advice about side effects. You may report side effects to FDA at 1-800-FDA-1088. Where should I keep my medication? Keep out of the reach of children and pets. Keep this medication in the original packaging until you are ready to take it. Protect from light. See product for storage information. Each product may have different instructions. Get rid of any unused medication after the expiration date. To get rid of medications that are no longer needed or have expired: Take the medication to a medications take-back program. Check with your pharmacy or law enforcement to find a location. If you cannot return the medication, ask your pharmacist or care team how to get rid of this medication safely. NOTE: This sheet is a summary. It may not cover all possible information. If you have questions about this medicine, talk to your doctor, pharmacist, or health care provider.  2024 Elsevier/Gold Standard (2021-10-09 00:00:00)  

## 2023-01-29 ENCOUNTER — Inpatient Hospital Stay: Payer: 59

## 2023-01-29 ENCOUNTER — Inpatient Hospital Stay (HOSPITAL_BASED_OUTPATIENT_CLINIC_OR_DEPARTMENT_OTHER): Payer: 59 | Admitting: Oncology

## 2023-01-29 DIAGNOSIS — Z5189 Encounter for other specified aftercare: Secondary | ICD-10-CM | POA: Diagnosis not present

## 2023-01-29 DIAGNOSIS — C7801 Secondary malignant neoplasm of right lung: Secondary | ICD-10-CM | POA: Diagnosis not present

## 2023-01-29 DIAGNOSIS — D709 Neutropenia, unspecified: Secondary | ICD-10-CM | POA: Diagnosis not present

## 2023-01-29 DIAGNOSIS — D696 Thrombocytopenia, unspecified: Secondary | ICD-10-CM | POA: Diagnosis not present

## 2023-01-29 DIAGNOSIS — C7802 Secondary malignant neoplasm of left lung: Secondary | ICD-10-CM | POA: Diagnosis not present

## 2023-01-29 DIAGNOSIS — C189 Malignant neoplasm of colon, unspecified: Secondary | ICD-10-CM

## 2023-01-29 DIAGNOSIS — C787 Secondary malignant neoplasm of liver and intrahepatic bile duct: Secondary | ICD-10-CM

## 2023-01-29 DIAGNOSIS — C182 Malignant neoplasm of ascending colon: Secondary | ICD-10-CM | POA: Diagnosis not present

## 2023-01-29 DIAGNOSIS — C7800 Secondary malignant neoplasm of unspecified lung: Secondary | ICD-10-CM | POA: Diagnosis not present

## 2023-01-29 DIAGNOSIS — E876 Hypokalemia: Secondary | ICD-10-CM | POA: Diagnosis not present

## 2023-01-29 DIAGNOSIS — Z5111 Encounter for antineoplastic chemotherapy: Secondary | ICD-10-CM | POA: Diagnosis not present

## 2023-01-29 DIAGNOSIS — C7931 Secondary malignant neoplasm of brain: Secondary | ICD-10-CM | POA: Diagnosis not present

## 2023-01-29 DIAGNOSIS — C7971 Secondary malignant neoplasm of right adrenal gland: Secondary | ICD-10-CM | POA: Diagnosis not present

## 2023-01-29 DIAGNOSIS — C786 Secondary malignant neoplasm of retroperitoneum and peritoneum: Secondary | ICD-10-CM | POA: Diagnosis not present

## 2023-01-29 LAB — CBC AND DIFFERENTIAL
HCT: 32 — AB (ref 36–46)
Hemoglobin: 10.9 — AB (ref 12.0–16.0)
Neutrophils Absolute: 4.03
Platelets: 68 10*3/uL — AB (ref 150–400)
WBC: 6.3

## 2023-01-29 LAB — TOTAL PROTEIN, URINE DIPSTICK: Protein, ur: NEGATIVE mg/dL

## 2023-01-29 LAB — CMP (CANCER CENTER ONLY)
ALT: 18 U/L (ref 0–44)
AST: 20 U/L (ref 15–41)
Albumin: 3.6 g/dL (ref 3.5–5.0)
Alkaline Phosphatase: 147 U/L — ABNORMAL HIGH (ref 38–126)
Anion gap: 8 (ref 5–15)
BUN: 23 mg/dL — ABNORMAL HIGH (ref 6–20)
CO2: 23 mmol/L (ref 22–32)
Calcium: 8.6 mg/dL — ABNORMAL LOW (ref 8.9–10.3)
Chloride: 104 mmol/L (ref 98–111)
Creatinine: 1.29 mg/dL — ABNORMAL HIGH (ref 0.44–1.00)
GFR, Estimated: 49 mL/min — ABNORMAL LOW (ref >60)
Glucose, Bld: 141 mg/dL — ABNORMAL HIGH (ref 70–99)
Potassium: 3.8 mmol/L (ref 3.5–5.1)
Sodium: 135 mmol/L (ref 135–145)
Total Bilirubin: 0.6 mg/dL (ref 0.3–1.2)
Total Protein: 6.5 g/dL (ref 6.5–8.1)

## 2023-01-29 LAB — CBC W DIFFERENTIAL (~~LOC~~ CC SCANNED REPORT)

## 2023-01-29 LAB — CBC: RBC: 3.13 — AB (ref 3.87–5.11)

## 2023-01-29 NOTE — Progress Notes (Unsigned)
[facility]  [address] [place],  [place]  [address] [phone]  Clinic Day:  01/29/2023  Referring physician: Alinda Deem, MD  HISTORY OF PRESENT ILLNESS:  The patient is a 57 y.o. female with  metastatic colon cancer, which includes spread of disease to her abdominal cavity, lungs, brain and right adrenal gland.  She comes in today to be evaluated again before heading into her 11th cycle of  FOLFOX/Avastin.  The patient claims to have tolerated her 10th cycle of FOLFOX/Avastin fairly well.  She did have intermittent diarrhea.  She also has intermittent tingling of her fingers.  However, she claims her symptoms have not increased in severity to where her oxaliplatin needs to either be decreased or discontinued.  She denies having any new symptoms or findings which concern her for possible disease progression.  With respect to her colon cancer history, she is status post a right hemicolectomy in early October 2018, followed by 12 cycles of adjuvant FOLFOX chemotherapy, which were completed in April 2019.  In December 2021, she underwent a left cerebellar metastasectomy, whose pathology was consistent with metastatic colon cancer.  Tumor testing did come back MMR normal. CT scans revealed evidence of her cancer being in multiple locations, for which she took 40 cycles of FOLFIRI/Avastin.  As follow-up scans revealed liver metastasis, she was placed back on FOLFOX/Avastin, for which she continues to take.  PHYSICAL EXAM:  Blood pressure 122/74, pulse 97, temperature 98.3 F (36.8 C), resp. rate 14, height 5\' 8"  (1.727 m), weight 162 lb 1.6 oz (73.5 kg), last menstrual period 02/22/2019, SpO2 100%. Body mass index is 24.65 kg/m.  Performance status (ECOG): 1 - Symptomatic but completely ambulatory  Physical Exam Vitals and nursing note reviewed.  Constitutional:      General: She is not in acute distress.    Appearance: Normal appearance.  HENT:      Head: Normocephalic and atraumatic.     Mouth/Throat:     Mouth: Mucous membranes are moist.     Pharynx: Oropharynx is clear. No oropharyngeal exudate or posterior oropharyngeal erythema.  Eyes:     General: No scleral icterus.    Extraocular Movements: Extraocular movements intact.     Conjunctiva/sclera: Conjunctivae normal.     Pupils: Pupils are equal, round, and reactive to light.  Cardiovascular:     Rate and Rhythm: Normal rate and regular rhythm.     Heart sounds: Normal heart sounds. No murmur heard.    No friction rub. No gallop.  Pulmonary:     Effort: Pulmonary effort is normal.     Breath sounds: Normal breath sounds. No wheezing, rhonchi or rales.  Abdominal:     General: There is no distension.     Palpations: Abdomen is soft. There is no hepatomegaly, splenomegaly or mass.     Tenderness: There is no abdominal tenderness.  Musculoskeletal:        General: Normal range of motion.     Cervical back: Normal range of motion and neck supple. No tenderness.     Right lower leg: No edema.     Left lower leg: No edema.  Lymphadenopathy:     Cervical: No cervical adenopathy.     Upper Body:     Right upper body: No supraclavicular or axillary adenopathy.     Left upper body: No supraclavicular or axillary adenopathy.     Lower Body: No right inguinal adenopathy. No left inguinal adenopathy.  Skin:  General: Skin is warm and dry.     Coloration: Skin is not jaundiced.     Findings: No rash.  Neurological:     Mental Status: She is alert and oriented to person, place, and time.     Cranial Nerves: No cranial nerve deficit.  Psychiatric:        Mood and Affect: Mood normal.        Behavior: Behavior normal.        Thought Content: Thought content normal.    LABS:       Latest Ref Rng & Units 01/29/2023   12:00 AM 01/18/2023   12:00 AM 01/04/2023   12:00 AM  CBC  WBC  6.3     3.7     1.9      Hemoglobin 12.0 - 16.0 10.9     12.1     11.4      Hematocrit 36 -  46 32     36     33      Platelets 150 - 400 K/uL 68     106     42         This result is from an external source.      Latest Ref Rng & Units 01/29/2023    1:37 PM 01/18/2023    2:00 PM 01/04/2023   12:00 AM  CMP  Glucose 70 - 99 mg/dL 621  308    BUN 6 - 20 mg/dL 23  17  13       Creatinine 0.44 - 1.00 mg/dL 6.57  8.46  1.2      Sodium 135 - 145 mmol/L 135  144  140      Potassium 3.5 - 5.1 mmol/L 3.8  4.7  3.3      Chloride 98 - 111 mmol/L 104  106  106      CO2 22 - 32 mmol/L 23  24  22       Calcium 8.9 - 10.3 mg/dL 8.6  9.7  9.2      Total Protein 6.5 - 8.1 g/dL 6.5  6.6    Total Bilirubin 0.3 - 1.2 mg/dL 0.6  0.5    Alkaline Phos 38 - 126 U/L 147  126  144      AST 15 - 41 U/L 20  25  27       ALT 0 - 44 U/L 18  20  18          This result is from an external source.   ASSESSMENT & PLAN:  Assessment/Plan:  A 57 y.o. female with metastatic colon cancer.  She will proceed with her 11th cycle of FOLFOX/Avastin next week.  Clinically, she appears to be doing will.  She will continue to receive this regimen every 2 weeks.  I will see her back in 2 weeks before she heads into her 12th cycle of FOLFOX/Avastin.  The patient understands all the plans discussed today and is in agreement with them.  Camara Rosander Kirby Funk, MD

## 2023-02-02 ENCOUNTER — Other Ambulatory Visit: Payer: Self-pay | Admitting: Oncology

## 2023-02-02 ENCOUNTER — Encounter: Payer: Self-pay | Admitting: Hematology and Oncology

## 2023-02-02 ENCOUNTER — Encounter: Payer: Self-pay | Admitting: Oncology

## 2023-02-02 DIAGNOSIS — C182 Malignant neoplasm of ascending colon: Secondary | ICD-10-CM

## 2023-02-02 DIAGNOSIS — C7800 Secondary malignant neoplasm of unspecified lung: Secondary | ICD-10-CM

## 2023-02-02 DIAGNOSIS — C189 Malignant neoplasm of colon, unspecified: Secondary | ICD-10-CM

## 2023-02-03 ENCOUNTER — Inpatient Hospital Stay: Payer: 59 | Attending: Oncology

## 2023-02-03 VITALS — BP 100/62 | HR 96 | Temp 97.2°F | Resp 18 | Ht 68.0 in | Wt 160.1 lb

## 2023-02-03 DIAGNOSIS — G629 Polyneuropathy, unspecified: Secondary | ICD-10-CM | POA: Insufficient documentation

## 2023-02-03 DIAGNOSIS — C7802 Secondary malignant neoplasm of left lung: Secondary | ICD-10-CM | POA: Insufficient documentation

## 2023-02-03 DIAGNOSIS — Z5111 Encounter for antineoplastic chemotherapy: Secondary | ICD-10-CM | POA: Insufficient documentation

## 2023-02-03 DIAGNOSIS — C182 Malignant neoplasm of ascending colon: Secondary | ICD-10-CM

## 2023-02-03 DIAGNOSIS — C189 Malignant neoplasm of colon, unspecified: Secondary | ICD-10-CM | POA: Insufficient documentation

## 2023-02-03 DIAGNOSIS — C787 Secondary malignant neoplasm of liver and intrahepatic bile duct: Secondary | ICD-10-CM | POA: Insufficient documentation

## 2023-02-03 DIAGNOSIS — C7800 Secondary malignant neoplasm of unspecified lung: Secondary | ICD-10-CM

## 2023-02-03 DIAGNOSIS — C786 Secondary malignant neoplasm of retroperitoneum and peritoneum: Secondary | ICD-10-CM | POA: Insufficient documentation

## 2023-02-03 DIAGNOSIS — C7931 Secondary malignant neoplasm of brain: Secondary | ICD-10-CM | POA: Diagnosis not present

## 2023-02-03 DIAGNOSIS — Z5189 Encounter for other specified aftercare: Secondary | ICD-10-CM | POA: Diagnosis not present

## 2023-02-03 DIAGNOSIS — C7801 Secondary malignant neoplasm of right lung: Secondary | ICD-10-CM | POA: Insufficient documentation

## 2023-02-03 DIAGNOSIS — Z23 Encounter for immunization: Secondary | ICD-10-CM | POA: Diagnosis not present

## 2023-02-03 DIAGNOSIS — C7971 Secondary malignant neoplasm of right adrenal gland: Secondary | ICD-10-CM | POA: Insufficient documentation

## 2023-02-03 MED ORDER — OXALIPLATIN CHEMO INJECTION 50 MG/10ML
85.0000 mg/m2 | Freq: Once | INTRAVENOUS | Status: AC
  Administered 2023-02-03: 150 mg via INTRAVENOUS
  Filled 2023-02-03: qty 20

## 2023-02-03 MED ORDER — DEXTROSE 5 % IV SOLN: Freq: Once | INTRAVENOUS | Status: AC

## 2023-02-03 MED ORDER — SODIUM CHLORIDE 0.9 % IV SOLN
5.0000 mg/kg | Freq: Once | INTRAVENOUS | Status: AC
  Administered 2023-02-03: 350 mg via INTRAVENOUS
  Filled 2023-02-03: qty 14

## 2023-02-03 MED ORDER — FAMOTIDINE IN NACL 20-0.9 MG/50ML-% IV SOLN
20.0000 mg | Freq: Once | INTRAVENOUS | Status: AC
  Administered 2023-02-03: 20 mg via INTRAVENOUS
  Filled 2023-02-03: qty 50

## 2023-02-03 MED ORDER — SODIUM CHLORIDE 0.9 % IV SOLN
2400.0000 mg/m2 | INTRAVENOUS | Status: DC
  Administered 2023-02-03: 4350 mg via INTRAVENOUS
  Filled 2023-02-03: qty 87

## 2023-02-03 MED ORDER — INFLUENZA VIRUS VACC SPLIT PF (FLUZONE) 0.5 ML IM SUSY
0.5000 mL | PREFILLED_SYRINGE | Freq: Once | INTRAMUSCULAR | Status: AC
  Administered 2023-02-03: 0.5 mL via INTRAMUSCULAR
  Filled 2023-02-03: qty 0.5

## 2023-02-03 MED ORDER — SODIUM CHLORIDE 0.9 % IV SOLN: Freq: Once | INTRAVENOUS | Status: AC

## 2023-02-03 MED ORDER — FLUOROURACIL CHEMO INJECTION 2.5 GM/50ML
400.0000 mg/m2 | Freq: Once | INTRAVENOUS | Status: AC
  Administered 2023-02-03: 700 mg via INTRAVENOUS
  Filled 2023-02-03: qty 14

## 2023-02-03 MED ORDER — SODIUM CHLORIDE 0.9 % IV SOLN
150.0000 mg | Freq: Once | INTRAVENOUS | Status: AC
  Administered 2023-02-03: 150 mg via INTRAVENOUS
  Filled 2023-02-03: qty 150

## 2023-02-03 MED ORDER — PALONOSETRON HCL INJECTION 0.25 MG/5ML
0.2500 mg | Freq: Once | INTRAVENOUS | Status: AC
  Administered 2023-02-03: 0.25 mg via INTRAVENOUS
  Filled 2023-02-03: qty 5

## 2023-02-03 MED ORDER — DIPHENHYDRAMINE HCL 50 MG/ML IJ SOLN
25.0000 mg | Freq: Once | INTRAMUSCULAR | Status: AC
  Administered 2023-02-03: 25 mg via INTRAVENOUS
  Filled 2023-02-03: qty 1

## 2023-02-03 MED ORDER — SODIUM CHLORIDE 0.9 % IV SOLN
10.0000 mg | Freq: Once | INTRAVENOUS | Status: AC
  Administered 2023-02-03: 10 mg via INTRAVENOUS
  Filled 2023-02-03: qty 10

## 2023-02-03 NOTE — Patient Instructions (Signed)
Bevacizumab Injection What is this medication? BEVACIZUMAB (be va SIZ yoo mab) treats some types of cancer. It works by blocking a protein that causes cancer cells to grow and multiply. This helps to slow or stop the spread of cancer cells. It is a monoclonal antibody. This medicine may be used for other purposes; ask your health care provider or pharmacist if you have questions. COMMON BRAND NAME(S): Alymsys, Avastin, MVASI, Omer Jack What should I tell my care team before I take this medication? They need to know if you have any of these conditions: Blood clots Coughing up blood Having or recent surgery Heart failure High blood pressure History of a connection between 2 or more body parts that do not usually connect (fistula) History of a tear in your stomach or intestines Protein in your urine An unusual or allergic reaction to bevacizumab, other medications, foods, dyes, or preservatives Pregnant or trying to get pregnant Breast-feeding How should I use this medication? This medication is injected into a vein. It is given by your care team in a hospital or clinic setting. Talk to your care team the use of this medication in children. Special care may be needed. Overdosage: If you think you have taken too much of this medicine contact a poison control center or emergency room at once. NOTE: This medicine is only for you. Do not share this medicine with others. What if I miss a dose? Keep appointments for follow-up doses. It is important not to miss your dose. Call your care team if you are unable to keep an appointment. What may interact with this medication? Interactions are not expected. This list may not describe all possible interactions. Give your health care provider a list of all the medicines, herbs, non-prescription drugs, or dietary supplements you use. Also tell them if you smoke, drink alcohol, or use illegal drugs. Some items may interact with your medicine. What should I  watch for while using this medication? Your condition will be monitored carefully while you are receiving this medication. You may need blood work while taking this medication. This medication may make you feel generally unwell. This is not uncommon as chemotherapy can affect healthy cells as well as cancer cells. Report any side effects. Continue your course of treatment even though you feel ill unless your care team tells you to stop. This medication may increase your risk to bruise or bleed. Call your care team if you notice any unusual bleeding. Before having surgery, talk to your care team to make sure it is ok. This medication can increase the risk of poor healing of your surgical site or wound. You will need to stop this medication for 28 days before surgery. After surgery, wait at least 28 days before restarting this medication. Make sure the surgical site or wound is healed enough before restarting this medication. Talk to your care team if questions. Talk to your care team if you may be pregnant. Serious birth defects can occur if you take this medication during pregnancy and for 6 months after the last dose. Contraception is recommended while taking this medication and for 6 months after the last dose. Your care team can help you find the option that works for you. Do not breastfeed while taking this medication and for 6 months after the last dose. This medication can cause infertility. Talk to your care team if you are concerned about your fertility. What side effects may I notice from receiving this medication? Side effects that you should report  to your care team as soon as possible: Allergic reactions--skin rash, itching, hives, swelling of the face, lips, tongue, or throat Bleeding--bloody or black, tar-like stools, vomiting blood or brown material that looks like coffee grounds, red or dark brown urine, small red or purple spots on skin, unusual bruising or bleeding Blood clot--pain,  swelling, or warmth in the leg, shortness of breath, chest pain Heart attack--pain or tightness in the chest, shoulders, arms, or jaw, nausea, shortness of breath, cold or clammy skin, feeling faint or lightheaded Heart failure--shortness of breath, swelling of the ankles, feet, or hands, sudden weight gain, unusual weakness or fatigue Increase in blood pressure Infection--fever, chills, cough, sore throat, wounds that don't heal, pain or trouble when passing urine, general feeling of discomfort or being unwell Infusion reactions--chest pain, shortness of breath or trouble breathing, feeling faint or lightheaded Kidney injury--decrease in the amount of urine, swelling of the ankles, hands, or feet Stomach pain that is severe, does not go away, or gets worse Stroke--sudden numbness or weakness of the face, arm, or leg, trouble speaking, confusion, trouble walking, loss of balance or coordination, dizziness, severe headache, change in vision Sudden and severe headache, confusion, change in vision, seizures, which may be signs of posterior reversible encephalopathy syndrome (PRES) Side effects that usually do not require medical attention (report to your care team if they continue or are bothersome): Back pain Change in taste Diarrhea Dry skin Increased tears Nosebleed This list may not describe all possible side effects. Call your doctor for medical advice about side effects. You may report side effects to FDA at 1-800-FDA-1088. Where should I keep my medication? This medication is given in a hospital or clinic. It will not be stored at home. NOTE: This sheet is a summary. It may not cover all possible information. If you have questions about this medicine, talk to your doctor, pharmacist, or health care provider.  2024 Elsevier/Gold Standard (2021-10-03 00:00:00) Oxaliplatin Injection What is this medication? OXALIPLATIN (ox AL i PLA tin) treats colorectal cancer. It works by slowing down the  growth of cancer cells. This medicine may be used for other purposes; ask your health care provider or pharmacist if you have questions. COMMON BRAND NAME(S): Eloxatin What should I tell my care team before I take this medication? They need to know if you have any of these conditions: Heart disease History of irregular heartbeat or rhythm Liver disease Low blood cell levels (white cells, red cells, and platelets) Lung or breathing disease, such as asthma Take medications that treat or prevent blood clots Tingling of the fingers, toes, or other nerve disorder An unusual or allergic reaction to oxaliplatin, other medications, foods, dyes, or preservatives If you or your partner are pregnant or trying to get pregnant Breast-feeding How should I use this medication? This medication is injected into a vein. It is given by your care team in a hospital or clinic setting. Talk to your care team about the use of this medication in children. Special care may be needed. Overdosage: If you think you have taken too much of this medicine contact a poison control center or emergency room at once. NOTE: This medicine is only for you. Do not share this medicine with others. What if I miss a dose? Keep appointments for follow-up doses. It is important not to miss a dose. Call your care team if you are unable to keep an appointment. What may interact with this medication? Do not take this medication with any of the  following: Cisapride Dronedarone Pimozide Thioridazine This medication may also interact with the following: Aspirin and aspirin-like medications Certain medications that treat or prevent blood clots, such as warfarin, apixaban, dabigatran, and rivaroxaban Cisplatin Cyclosporine Diuretics Medications for infection, such as acyclovir, adefovir, amphotericin B, bacitracin, cidofovir, foscarnet, ganciclovir, gentamicin, pentamidine, vancomycin NSAIDs, medications for pain and inflammation,  such as ibuprofen or naproxen Other medications that cause heart rhythm changes Pamidronate Zoledronic acid This list may not describe all possible interactions. Give your health care provider a list of all the medicines, herbs, non-prescription drugs, or dietary supplements you use. Also tell them if you smoke, drink alcohol, or use illegal drugs. Some items may interact with your medicine. What should I watch for while using this medication? Your condition will be monitored carefully while you are receiving this medication. You may need blood work while taking this medication. This medication may make you feel generally unwell. This is not uncommon as chemotherapy can affect healthy cells as well as cancer cells. Report any side effects. Continue your course of treatment even though you feel ill unless your care team tells you to stop. This medication may increase your risk of getting an infection. Call your care team for advice if you get a fever, chills, sore throat, or other symptoms of a cold or flu. Do not treat yourself. Try to avoid being around people who are sick. Avoid taking medications that contain aspirin, acetaminophen, ibuprofen, naproxen, or ketoprofen unless instructed by your care team. These medications may hide a fever. Be careful brushing or flossing your teeth or using a toothpick because you may get an infection or bleed more easily. If you have any dental work done, tell your dentist you are receiving this medication. This medication can make you more sensitive to cold. Do not drink cold drinks or use ice. Cover exposed skin before coming in contact with cold temperatures or cold objects. When out in cold weather wear warm clothing and cover your mouth and nose to warm the air that goes into your lungs. Tell your care team if you get sensitive to the cold. Talk to your care team if you or your partner are pregnant or think either of you might be pregnant. This medication can  cause serious birth defects if taken during pregnancy and for 9 months after the last dose. A negative pregnancy test is required before starting this medication. A reliable form of contraception is recommended while taking this medication and for 9 months after the last dose. Talk to your care team about effective forms of contraception. Do not father a child while taking this medication and for 6 months after the last dose. Use a condom while having sex during this time period. Do not breastfeed while taking this medication and for 3 months after the last dose. This medication may cause infertility. Talk to your care team if you are concerned about your fertility. What side effects may I notice from receiving this medication? Side effects that you should report to your care team as soon as possible: Allergic reactions--skin rash, itching, hives, swelling of the face, lips, tongue, or throat Bleeding--bloody or black, tar-like stools, vomiting blood or brown material that looks like coffee grounds, red or dark brown urine, small red or purple spots on skin, unusual bruising or bleeding Dry cough, shortness of breath or trouble breathing Heart rhythm changes--fast or irregular heartbeat, dizziness, feeling faint or lightheaded, chest pain, trouble breathing Infection--fever, chills, cough, sore throat, wounds that don't heal,  pain or trouble when passing urine, general feeling of discomfort or being unwell Liver injury--right upper belly pain, loss of appetite, nausea, light-colored stool, dark yellow or brown urine, yellowing skin or eyes, unusual weakness or fatigue Low red blood cell level--unusual weakness or fatigue, dizziness, headache, trouble breathing Muscle injury--unusual weakness or fatigue, muscle pain, dark yellow or brown urine, decrease in amount of urine Pain, tingling, or numbness in the hands or feet Sudden and severe headache, confusion, change in vision, seizures, which may be  signs of posterior reversible encephalopathy syndrome (PRES) Unusual bruising or bleeding Side effects that usually do not require medical attention (report to your care team if they continue or are bothersome): Diarrhea Nausea Pain, redness, or swelling with sores inside the mouth or throat Unusual weakness or fatigue Vomiting This list may not describe all possible side effects. Call your doctor for medical advice about side effects. You may report side effects to FDA at 1-800-FDA-1088. Where should I keep my medication? This medication is given in a hospital or clinic. It will not be stored at home. NOTE: This sheet is a summary. It may not cover all possible information. If you have questions about this medicine, talk to your doctor, pharmacist, or health care provider.  2024 Elsevier/Gold Standard (2022-03-03 00:00:00)

## 2023-02-04 ENCOUNTER — Encounter: Payer: Self-pay | Admitting: Oncology

## 2023-02-05 ENCOUNTER — Inpatient Hospital Stay: Payer: 59

## 2023-02-05 VITALS — BP 110/65 | HR 90 | Temp 97.7°F | Resp 20 | Ht 68.0 in | Wt 163.1 lb

## 2023-02-05 DIAGNOSIS — Z5189 Encounter for other specified aftercare: Secondary | ICD-10-CM | POA: Diagnosis not present

## 2023-02-05 DIAGNOSIS — C7802 Secondary malignant neoplasm of left lung: Secondary | ICD-10-CM | POA: Diagnosis not present

## 2023-02-05 DIAGNOSIS — C189 Malignant neoplasm of colon, unspecified: Secondary | ICD-10-CM | POA: Diagnosis not present

## 2023-02-05 DIAGNOSIS — Z5111 Encounter for antineoplastic chemotherapy: Secondary | ICD-10-CM | POA: Diagnosis not present

## 2023-02-05 DIAGNOSIS — Z23 Encounter for immunization: Secondary | ICD-10-CM | POA: Diagnosis not present

## 2023-02-05 DIAGNOSIS — G629 Polyneuropathy, unspecified: Secondary | ICD-10-CM | POA: Diagnosis not present

## 2023-02-05 DIAGNOSIS — C7931 Secondary malignant neoplasm of brain: Secondary | ICD-10-CM | POA: Diagnosis not present

## 2023-02-05 DIAGNOSIS — C182 Malignant neoplasm of ascending colon: Secondary | ICD-10-CM

## 2023-02-05 DIAGNOSIS — C787 Secondary malignant neoplasm of liver and intrahepatic bile duct: Secondary | ICD-10-CM | POA: Diagnosis not present

## 2023-02-05 DIAGNOSIS — C7971 Secondary malignant neoplasm of right adrenal gland: Secondary | ICD-10-CM | POA: Diagnosis not present

## 2023-02-05 DIAGNOSIS — C7801 Secondary malignant neoplasm of right lung: Secondary | ICD-10-CM | POA: Diagnosis not present

## 2023-02-05 DIAGNOSIS — C786 Secondary malignant neoplasm of retroperitoneum and peritoneum: Secondary | ICD-10-CM | POA: Diagnosis not present

## 2023-02-05 MED ORDER — HEPARIN SOD (PORK) LOCK FLUSH 100 UNIT/ML IV SOLN
500.0000 [IU] | Freq: Once | INTRAVENOUS | Status: AC | PRN
  Administered 2023-02-05: 500 [IU]

## 2023-02-05 MED ORDER — SODIUM CHLORIDE 0.9% FLUSH
10.0000 mL | INTRAVENOUS | Status: DC | PRN
  Administered 2023-02-05: 10 mL

## 2023-02-05 NOTE — Patient Instructions (Signed)
Managing Chemotherapy Side Effects, Adult Chemotherapy is a treatment that uses medicine to kill cancer cells. However, in addition to killing cancer cells, the medicines can also damage healthy cells. The damage to healthy cells can lead to side effects. The exact side effects depend on the specific medicines used. Most of the side effects of chemotherapy go away once treatment is finished. Until then, work closely with your health care providers and take an active role in managing your side effects. What are common side effects of chemotherapy? Increased risk of infection, bruising, or bleeding. Nausea and vomiting. Constipation or diarrhea. Loss of appetite. Hair loss. Mouth or throat sores. Tiredness (fatigue). Tingling, pain, or numbness in the hands and feet. Dry, sensitive, itchy, or sore skin. Sleep disturbances, such as excessive sleepiness. Confusion, anxiety, or mood swings. Memory changes. How to manage the side effects of chemotherapy Medicines Take over-the-counter and prescription medicines only as told by your health care provider. Talk with your health care provider before taking vitamins, herbs, supplements, or over-the-counter medicines. Some of these can interfere with chemotherapy. Activity Get plenty of rest. Get regular exercise by doing activities such as walking, gentle yoga, or tai chi. Return to your normal activities as told by your health care provider. Ask your health care provider what activities are safe for you. Eating and drinking  Talk to a dietitian about what you should eat and drink during cancer treatment. Drink enough fluid to keep your urine pale yellow. If you have side effects that affect eating, these tips may help: Eat smaller meals and snacks often. Drink high-nutrition and high-calorie shakes or supplements. Choose bland and soft foods that are easy to eat. Do not eat foods that are hot, spicy, or hard to swallow. Do not eat raw or  undercooked meat, eggs, or seafood. Always wash fresh fruits and vegetables well before eating them. Skin care If you have sore or itchy skin: Wear soft, comfortable clothing. Apply creams and ointments to your skin as told by your health care provider. If you lose your hair, consider wearing a wig, hat, or scarf to cover your head. You may want to have someone shave your head as you start to lose hair. During outdoor activities, protect your head and skin from the sun by using sunscreen with an SPF of 30 or higher or by wearing protective clothing and a hat. Meet with a hair and skin care specialist for makeup and skin care tips. Apply sunscreen to your scalp as told by your health care provider. General tips Learn as much as you can about your condition. If you are struggling emotionally, talk with a mental health care provider or join a support group. Keep all follow-up visits. This is important. How to prevent infection and bleeding Chemotherapy may lower your blood counts and put you at risk for infection and bleeding. Here are some ways to help prevent problems. Vaccines Talk to your health care provider about vaccines. You should not get any live vaccines, such as the polio, MMR, chickenpox, and shingles vaccines until your health care provider says that it is safe to do so. Do not be around people who have had live vaccines for as long as your health care provider recommends. Make sure you get a yearly flu shot. People who will be near you should also get a yearly flu shot. Social activity Stay away from crowded places where you could be exposed to germs. Do not be around people who may be sick or   people who have symptoms of a fever until they have been fever-free for at least 24 hours. Do not share food, cups, straws, or utensils with other people. Wear a mask when outside the home if your blood counts are low. Cleanliness  Wash your hands often for at least 20 seconds. Also make  sure that other members of your household wash their hands often. Brush your teeth twice daily using a soft toothbrush. Use mouth rinse only as told by your health care provider. Take a bath or shower daily unless your health care provider gives different instructions. General tips Take your temperature regularly, especially if you have chills or feel warm. Check with your health care provider: Before you travel. Before you have a dental procedure. Before you use a swimming pool, hot tub, or swim in a lake or ocean. If you get chemotherapy through an IV or port, check the site every day for signs of infection. Check for redness, swelling, pain, fluid, and warmth. Avoid activities that put you at risk for injury or bleeding. Use an electric razor to shave instead of a blade. Questions to ask your health care provider What are the most common side effects of my treatment? How will they affect my daily life? What can I do to manage them? What are some possible long-term side effects? What are possible complications? What support services are available? What number can I call with questions or concerns? Where to find support Cancer affects the entire family. Find out what family support resources are available from your cancer treatment center. For more support, turn to: Your cancer care team. Friends and family. Your religious community. Other people with cancer. Community-based or online support groups. Where to find more information National Cancer Institute: www.cancer.gov American [employer] Society: www.cancer.org Contact a health care provider if: You bleed or bruise more often. You notice blood in your urine or stool. You have any of these symptoms: A skin rash, or dry or itchy skin. A headache or stiff neck. Cold or flu symptoms. A cough. Persistent nausea or vomiting. Persistent diarrhea. Frequent urination, burning when passing urine, or foul-smelling urine. You cannot eat  because of mouth or throat pain. You are sad, confused, anxious, or depressed. Get help right away if: You have any of these symptoms: A fever or chills. Your health care provider should know about this right away. Redness, swelling, pain, fluid, or warmth near an IV site. Bleeding that you cannot stop. A seizure. You cannot swallow. You have chest pain. You have trouble breathing. A family member or caregiver should get help right away if you have a sudden or unusual change in behavior. These symptoms may be an emergency. Get help right away. Call 911. Do not wait to see if the symptoms will go away. Do not drive yourself to the hospital. Summary Chemotherapy is a treatment that uses medicine to kill cancer cells and can cause side effects. The specific side effects depend on the specific medicines used. Learn as much as you can about your condition. Ask about side effects to watch for and how to treat them. Seek out support and resources from others. Find out what family support resources are available from your cancer treatment center. Let your health care provider know if you notice any new, unusual, or worsening symptoms, especially fever or chills. This information is not intended to replace advice given to you by your health care provider. Make sure you discuss any questions you have with your health   care provider. Document Revised: 05/08/2021 Document Reviewed: 05/08/2021 Elsevier Patient Education  2024 [employer]  

## 2023-02-08 ENCOUNTER — Inpatient Hospital Stay: Payer: 59

## 2023-02-08 VITALS — BP 101/75 | HR 93 | Temp 98.4°F | Resp 16 | Ht 68.0 in | Wt 159.0 lb

## 2023-02-08 DIAGNOSIS — C786 Secondary malignant neoplasm of retroperitoneum and peritoneum: Secondary | ICD-10-CM | POA: Diagnosis not present

## 2023-02-08 DIAGNOSIS — C7802 Secondary malignant neoplasm of left lung: Secondary | ICD-10-CM | POA: Diagnosis not present

## 2023-02-08 DIAGNOSIS — G629 Polyneuropathy, unspecified: Secondary | ICD-10-CM | POA: Diagnosis not present

## 2023-02-08 DIAGNOSIS — C7931 Secondary malignant neoplasm of brain: Secondary | ICD-10-CM | POA: Diagnosis not present

## 2023-02-08 DIAGNOSIS — C7971 Secondary malignant neoplasm of right adrenal gland: Secondary | ICD-10-CM | POA: Diagnosis not present

## 2023-02-08 DIAGNOSIS — C189 Malignant neoplasm of colon, unspecified: Secondary | ICD-10-CM | POA: Diagnosis not present

## 2023-02-08 DIAGNOSIS — Z5189 Encounter for other specified aftercare: Secondary | ICD-10-CM | POA: Diagnosis not present

## 2023-02-08 DIAGNOSIS — Z23 Encounter for immunization: Secondary | ICD-10-CM | POA: Diagnosis not present

## 2023-02-08 DIAGNOSIS — C787 Secondary malignant neoplasm of liver and intrahepatic bile duct: Secondary | ICD-10-CM | POA: Diagnosis not present

## 2023-02-08 DIAGNOSIS — C7801 Secondary malignant neoplasm of right lung: Secondary | ICD-10-CM | POA: Diagnosis not present

## 2023-02-08 DIAGNOSIS — C182 Malignant neoplasm of ascending colon: Secondary | ICD-10-CM

## 2023-02-08 DIAGNOSIS — Z5111 Encounter for antineoplastic chemotherapy: Secondary | ICD-10-CM | POA: Diagnosis not present

## 2023-02-08 MED ORDER — FILGRASTIM-SNDZ 480 MCG/0.8ML IJ SOSY
480.0000 ug | PREFILLED_SYRINGE | Freq: Once | INTRAMUSCULAR | Status: AC
  Administered 2023-02-08: 480 ug via SUBCUTANEOUS
  Filled 2023-02-08: qty 0.8

## 2023-02-09 ENCOUNTER — Inpatient Hospital Stay: Payer: 59

## 2023-02-09 VITALS — BP 106/71 | HR 95 | Temp 98.1°F | Resp 18 | Ht 68.0 in | Wt 159.0 lb

## 2023-02-09 DIAGNOSIS — C182 Malignant neoplasm of ascending colon: Secondary | ICD-10-CM

## 2023-02-09 DIAGNOSIS — C189 Malignant neoplasm of colon, unspecified: Secondary | ICD-10-CM | POA: Diagnosis not present

## 2023-02-09 DIAGNOSIS — Z5189 Encounter for other specified aftercare: Secondary | ICD-10-CM | POA: Diagnosis not present

## 2023-02-09 DIAGNOSIS — C7931 Secondary malignant neoplasm of brain: Secondary | ICD-10-CM | POA: Diagnosis not present

## 2023-02-09 DIAGNOSIS — C786 Secondary malignant neoplasm of retroperitoneum and peritoneum: Secondary | ICD-10-CM | POA: Diagnosis not present

## 2023-02-09 DIAGNOSIS — G629 Polyneuropathy, unspecified: Secondary | ICD-10-CM | POA: Diagnosis not present

## 2023-02-09 DIAGNOSIS — C7801 Secondary malignant neoplasm of right lung: Secondary | ICD-10-CM | POA: Diagnosis not present

## 2023-02-09 DIAGNOSIS — C7971 Secondary malignant neoplasm of right adrenal gland: Secondary | ICD-10-CM | POA: Diagnosis not present

## 2023-02-09 DIAGNOSIS — Z5111 Encounter for antineoplastic chemotherapy: Secondary | ICD-10-CM | POA: Diagnosis not present

## 2023-02-09 DIAGNOSIS — C7802 Secondary malignant neoplasm of left lung: Secondary | ICD-10-CM | POA: Diagnosis not present

## 2023-02-09 DIAGNOSIS — Z23 Encounter for immunization: Secondary | ICD-10-CM | POA: Diagnosis not present

## 2023-02-09 DIAGNOSIS — C787 Secondary malignant neoplasm of liver and intrahepatic bile duct: Secondary | ICD-10-CM | POA: Diagnosis not present

## 2023-02-09 MED ORDER — FILGRASTIM-SNDZ 480 MCG/0.8ML IJ SOSY
480.0000 ug | PREFILLED_SYRINGE | Freq: Once | INTRAMUSCULAR | Status: AC
  Administered 2023-02-09: 480 ug via SUBCUTANEOUS
  Filled 2023-02-09: qty 0.8

## 2023-02-09 NOTE — Patient Instructions (Signed)
Filgrastim Injection What is this medication? FILGRASTIM (fil GRA stim) lowers the risk of infection in people who are receiving chemotherapy. It works by helping your body make more white blood cells, which protects your body from infection. It may also be used to help people who have been exposed to high doses of radiation. It can be used to help prepare your body before a stem cell transplant. It works by helping your bone marrow make and release stem cells into the blood. This medicine may be used for other purposes; ask your health care provider or pharmacist if you have questions. COMMON BRAND NAME(S): Neupogen, Nivestym, Releuko, Zarxio What should I tell my care team before I take this medication? They need to know if you have any of these conditions: History of blood diseases, such as sickle cell anemia Kidney disease Recent or ongoing radiation An unusual or allergic reaction to filgrastim, pegfilgrastim, latex, rubber, other medications, foods, dyes, or preservatives Pregnant or trying to get pregnant Breast-feeding How should I use this medication? This medication is injected under the skin or into a vein. It is usually given by your care team in a hospital or clinic setting. It may be given at home. If you get this medication at home, you will be taught how to prepare and give it. Use exactly as directed. Take it as directed on the prescription label at the same time every day. Keep taking it unless your care team tells you to stop. It is important that you put your used needles and syringes in a special sharps container. Do not put them in a trash can. If you do not have a sharps container, call your pharmacist or care team to get one. This medication comes with INSTRUCTIONS FOR USE. Ask your pharmacist for directions on how to use this medication. Read the information carefully. Talk to your pharmacist or care team if you have questions. Talk to your care team about the use of this  medication in children. While it may be prescribed for children for selected conditions, precautions do apply. Overdosage: If you think you have taken too much of this medicine contact a poison control center or emergency room at once. NOTE: This medicine is only for you. Do not share this medicine with others. What if I miss a dose? It is important not to miss any doses. Talk to your care team about what to do if you miss a dose. What may interact with this medication? Medications that may cause a release of neutrophils, such as lithium This list may not describe all possible interactions. Give your health care provider a list of all the medicines, herbs, non-prescription drugs, or dietary supplements you use. Also tell them if you smoke, drink alcohol, or use illegal drugs. Some items may interact with your medicine. What should I watch for while using this medication? Your condition will be monitored carefully while you are receiving this medication. You may need bloodwork while taking this medication. Talk to your care team about your risk of cancer. You may be more at risk for certain types of cancer if you take this medication. What side effects may I notice from receiving this medication? Side effects that you should report to your care team as soon as possible: Allergic reactions--skin rash, itching, hives, swelling of the face, lips, tongue, or throat Capillary leak syndrome--stomach or muscle pain, unusual weakness or fatigue, feeling faint or lightheaded, decrease in the amount of urine, swelling of the ankles, hands, or   feet, trouble breathing High white blood cell level--fever, fatigue, trouble breathing, night sweats, change in vision, weight loss Inflammation of the aorta--fever, fatigue, back, chest, or stomach pain, severe headache Kidney injury (glomerulonephritis)--decrease in the amount of urine, red or dark brown urine, foamy or bubbly urine, swelling of the ankles, hands, or  feet Shortness of breath or trouble breathing Spleen injury--pain in upper left stomach or shoulder Unusual bruising or bleeding Side effects that usually do not require medical attention (report to your care team if they continue or are bothersome): Back pain Bone pain Fatigue Fever Headache Nausea This list may not describe all possible side effects. Call your doctor for medical advice about side effects. You may report side effects to FDA at 1-800-FDA-1088. Where should I keep my medication? Keep out of the reach of children and pets. Keep this medication in the original packaging until you are ready to take it. Protect from light. See product for storage information. Each product may have different instructions. Get rid of any unused medication after the expiration date. To get rid of medications that are no longer needed or have expired: Take the medication to a medications take-back program. Check with your pharmacy or law enforcement to find a location. If you cannot return the medication, ask your pharmacist or care team how to get rid of this medication safely. NOTE: This sheet is a summary. It may not cover all possible information. If you have questions about this medicine, talk to your doctor, pharmacist, or health care provider.  2024 Elsevier/Gold Standard (2021-10-09 00:00:00)  

## 2023-02-10 ENCOUNTER — Encounter: Payer: Self-pay | Admitting: Oncology

## 2023-02-10 ENCOUNTER — Inpatient Hospital Stay: Payer: 59

## 2023-02-10 VITALS — BP 118/70 | HR 81 | Temp 98.0°F | Resp 18 | Wt 159.0 lb

## 2023-02-10 DIAGNOSIS — Z23 Encounter for immunization: Secondary | ICD-10-CM | POA: Diagnosis not present

## 2023-02-10 DIAGNOSIS — G629 Polyneuropathy, unspecified: Secondary | ICD-10-CM | POA: Diagnosis not present

## 2023-02-10 DIAGNOSIS — C7801 Secondary malignant neoplasm of right lung: Secondary | ICD-10-CM | POA: Diagnosis not present

## 2023-02-10 DIAGNOSIS — C182 Malignant neoplasm of ascending colon: Secondary | ICD-10-CM

## 2023-02-10 DIAGNOSIS — C786 Secondary malignant neoplasm of retroperitoneum and peritoneum: Secondary | ICD-10-CM | POA: Diagnosis not present

## 2023-02-10 DIAGNOSIS — C189 Malignant neoplasm of colon, unspecified: Secondary | ICD-10-CM | POA: Diagnosis not present

## 2023-02-10 DIAGNOSIS — C7931 Secondary malignant neoplasm of brain: Secondary | ICD-10-CM | POA: Diagnosis not present

## 2023-02-10 DIAGNOSIS — Z5189 Encounter for other specified aftercare: Secondary | ICD-10-CM | POA: Diagnosis not present

## 2023-02-10 DIAGNOSIS — C787 Secondary malignant neoplasm of liver and intrahepatic bile duct: Secondary | ICD-10-CM | POA: Diagnosis not present

## 2023-02-10 DIAGNOSIS — C7971 Secondary malignant neoplasm of right adrenal gland: Secondary | ICD-10-CM | POA: Diagnosis not present

## 2023-02-10 DIAGNOSIS — Z5111 Encounter for antineoplastic chemotherapy: Secondary | ICD-10-CM | POA: Diagnosis not present

## 2023-02-10 DIAGNOSIS — C7802 Secondary malignant neoplasm of left lung: Secondary | ICD-10-CM | POA: Diagnosis not present

## 2023-02-10 MED ORDER — SODIUM CHLORIDE 0.9 % IV SOLN: Freq: Once | INTRAVENOUS | Status: AC

## 2023-02-10 MED ORDER — FILGRASTIM-SNDZ 480 MCG/0.8ML IJ SOSY
480.0000 ug | PREFILLED_SYRINGE | Freq: Once | INTRAMUSCULAR | Status: AC
  Administered 2023-02-10: 480 ug via SUBCUTANEOUS
  Filled 2023-02-10: qty 0.8

## 2023-02-10 NOTE — Patient Instructions (Signed)
Filgrastim Injection What is this medication? FILGRASTIM (fil GRA stim) lowers the risk of infection in people who are receiving chemotherapy. It works by helping your body make more white blood cells, which protects your body from infection. It may also be used to help people who have been exposed to high doses of radiation. It can be used to help prepare your body before a stem cell transplant. It works by helping your bone marrow make and release stem cells into the blood. This medicine may be used for other purposes; ask your health care provider or pharmacist if you have questions. COMMON BRAND NAME(S): Neupogen, Nivestym, Releuko, Zarxio What should I tell my care team before I take this medication? They need to know if you have any of these conditions: History of blood diseases, such as sickle cell anemia Kidney disease Recent or ongoing radiation An unusual or allergic reaction to filgrastim, pegfilgrastim, latex, rubber, other medications, foods, dyes, or preservatives Pregnant or trying to get pregnant Breast-feeding How should I use this medication? This medication is injected under the skin or into a vein. It is usually given by your care team in a hospital or clinic setting. It may be given at home. If you get this medication at home, you will be taught how to prepare and give it. Use exactly as directed. Take it as directed on the prescription label at the same time every day. Keep taking it unless your care team tells you to stop. It is important that you put your used needles and syringes in a special sharps container. Do not put them in a trash can. If you do not have a sharps container, call your pharmacist or care team to get one. This medication comes with INSTRUCTIONS FOR USE. Ask your pharmacist for directions on how to use this medication. Read the information carefully. Talk to your pharmacist or care team if you have questions. Talk to your care team about the use of this  medication in children. While it may be prescribed for children for selected conditions, precautions do apply. Overdosage: If you think you have taken too much of this medicine contact a poison control center or emergency room at once. NOTE: This medicine is only for you. Do not share this medicine with others. What if I miss a dose? It is important not to miss any doses. Talk to your care team about what to do if you miss a dose. What may interact with this medication? Medications that may cause a release of neutrophils, such as lithium This list may not describe all possible interactions. Give your health care provider a list of all the medicines, herbs, non-prescription drugs, or dietary supplements you use. Also tell them if you smoke, drink alcohol, or use illegal drugs. Some items may interact with your medicine. What should I watch for while using this medication? Your condition will be monitored carefully while you are receiving this medication. You may need bloodwork while taking this medication. Talk to your care team about your risk of cancer. You may be more at risk for certain types of cancer if you take this medication. What side effects may I notice from receiving this medication? Side effects that you should report to your care team as soon as possible: Allergic reactions--skin rash, itching, hives, swelling of the face, lips, tongue, or throat Capillary leak syndrome--stomach or muscle pain, unusual weakness or fatigue, feeling faint or lightheaded, decrease in the amount of urine, swelling of the ankles, hands, or   feet, trouble breathing High white blood cell level--fever, fatigue, trouble breathing, night sweats, change in vision, weight loss Inflammation of the aorta--fever, fatigue, back, chest, or stomach pain, severe headache Kidney injury (glomerulonephritis)--decrease in the amount of urine, red or dark brown urine, foamy or bubbly urine, swelling of the ankles, hands, or  feet Shortness of breath or trouble breathing Spleen injury--pain in upper left stomach or shoulder Unusual bruising or bleeding Side effects that usually do not require medical attention (report to your care team if they continue or are bothersome): Back pain Bone pain Fatigue Fever Headache Nausea This list may not describe all possible side effects. Call your doctor for medical advice about side effects. You may report side effects to FDA at 1-800-FDA-1088. Where should I keep my medication? Keep out of the reach of children and pets. Keep this medication in the original packaging until you are ready to take it. Protect from light. See product for storage information. Each product may have different instructions. Get rid of any unused medication after the expiration date. To get rid of medications that are no longer needed or have expired: Take the medication to a medications take-back program. Check with your pharmacy or law enforcement to find a location. If you cannot return the medication, ask your pharmacist or care team how to get rid of this medication safely. NOTE: This sheet is a summary. It may not cover all possible information. If you have questions about this medicine, talk to your doctor, pharmacist, or health care provider.  2024 Elsevier/Gold Standard (2021-10-09 00:00:00)  

## 2023-02-11 ENCOUNTER — Other Ambulatory Visit: Payer: Self-pay | Admitting: Psychiatry

## 2023-02-11 DIAGNOSIS — G4721 Circadian rhythm sleep disorder, delayed sleep phase type: Secondary | ICD-10-CM

## 2023-02-12 ENCOUNTER — Other Ambulatory Visit: Payer: 59

## 2023-02-12 ENCOUNTER — Ambulatory Visit: Payer: 59 | Admitting: Oncology

## 2023-02-14 NOTE — Progress Notes (Unsigned)
[facility]  [address] [place],  [place]  [address] [phone]  Clinic Day:  02/15/2023  Referring physician: Alinda Deem, MD  HISTORY OF PRESENT ILLNESS:  The patient is a 57 y.o. female with  metastatic colon cancer, which includes spread of disease to her abdominal cavity, lungs, brain and right adrenal gland.  She comes in today to be evaluated before heading into her 12th cycle of  FOLFOX/Avastin.  The patient claims to have tolerated her 11th cycle of FOLFOX/Avastin fairly well.  She did have intermittent diarrhea.  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  From a disease perspective, she denies having any GI new symptoms or findings which concern her for possible disease progression.  With respect to her colon cancer history, she is status post a right hemicolectomy in early October 2018, followed by 12 cycles of adjuvant FOLFOX chemotherapy, which were completed in April 2019.  In December 2021, she underwent a left cerebellar metastasectomy, whose pathology was consistent with metastatic colon cancer.  Tumor testing did come back MMR normal. CT scans revealed evidence of her cancer being in multiple locations, for which she took 40 cycles of FOLFIRI/Avastin.  As follow-up scans revealed liver metastasis, she was placed back on FOLFOX/Avastin, for which she continues to take.  PHYSICAL EXAM:  Blood pressure 110/68, pulse 94, temperature 98.6 F (37 C), resp. rate 14, height 5\' 8"  (1.727 m), weight 162 lb 1.6 oz (73.5 kg), last menstrual period 02/22/2019, SpO2 97%. Body mass index is 24.65 kg/m.  Performance status (ECOG): 1 - Symptomatic but completely ambulatory  Physical Exam Vitals and nursing note reviewed.  Constitutional:      General: She is not in acute distress.    Appearance: Normal appearance.  HENT:     Head: Normocephalic and atraumatic.      Mouth/Throat:     Mouth: Mucous membranes are moist.     Pharynx: Oropharynx is clear. No oropharyngeal exudate or posterior oropharyngeal erythema.  Eyes:     General: No scleral icterus.    Extraocular Movements: Extraocular movements intact.     Conjunctiva/sclera: Conjunctivae normal.     Pupils: Pupils are equal, round, and reactive to light.  Cardiovascular:     Rate and Rhythm: Normal rate and regular rhythm.     Heart sounds: Normal heart sounds. No murmur heard.    No friction rub. No gallop.  Pulmonary:     Effort: Pulmonary effort is normal.     Breath sounds: Normal breath sounds. No wheezing, rhonchi or rales.  Abdominal:     General: There is no distension.     Palpations: Abdomen is soft. There is no hepatomegaly, splenomegaly or mass.     Tenderness: There is no abdominal tenderness.  Musculoskeletal:        General: Normal range of motion.     Cervical back: Normal range of motion and neck supple. No tenderness.     Right lower leg: No edema.     Left lower leg: No edema.  Lymphadenopathy:     Cervical: No cervical adenopathy.     Upper Body:     Right upper body: No supraclavicular or axillary adenopathy.     Left upper body: No supraclavicular or axillary adenopathy.     Lower Body: No right inguinal adenopathy. No left inguinal adenopathy.  Skin:    General: Skin is warm and  dry.     Coloration: Skin is not jaundiced.     Findings: No rash.  Neurological:     Mental Status: She is alert and oriented to person, place, and time.     Cranial Nerves: No cranial nerve deficit.  Psychiatric:        Mood and Affect: Mood normal.        Behavior: Behavior normal.        Thought Content: Thought content normal.    LABS:      Latest Ref Rng & Units 02/15/2023   12:00 AM 01/29/2023   12:00 AM 01/18/2023   12:00 AM  CBC  WBC  2.9     6.3     3.7      Hemoglobin 12.0 - 16.0 11.7     10.9     12.1      Hematocrit 36 - 46 35     32     36      Platelets 150 -  400 K/uL 57     68     106         This result is from an external source.      Latest Ref Rng & Units 02/15/2023    2:03 PM 01/29/2023    1:37 PM 01/18/2023    2:00 PM  CMP  Glucose 70 - 99 mg/dL 102  725  366   BUN 6 - 20 mg/dL 18  23  17    Creatinine 0.44 - 1.00 mg/dL 4.40  3.47  4.25   Sodium 135 - 145 mmol/L 137  135  144   Potassium 3.5 - 5.1 mmol/L 4.0  3.8  4.7   Chloride 98 - 111 mmol/L 104  104  106   CO2 22 - 32 mmol/L 24  23  24    Calcium 8.9 - 10.3 mg/dL 8.7  8.6  9.7   Total Protein 6.5 - 8.1 g/dL 6.3  6.5  6.6   Total Bilirubin 0.3 - 1.2 mg/dL 0.5  0.6  0.5   Alkaline Phos 38 - 126 U/L 117  147  126   AST 15 - 41 U/L 26  20  25    ALT 0 - 44 U/L 19  18  20     ASSESSMENT & PLAN:  Assessment/Plan:  A 56 y.o. female with metastatic colon cancer.  She will proceed with her 12th cycle of FOLFOX/Avastin this week.  Due to her worsening neuropathy, her oxaliplatin will be decreased by 25%.  If it persists over time, the patient understands her oxaliplatin may ultimately be discontinued altogether.  As she is moderately neutropenic, 3 doses of Christeen Douglas will be given to bolster her white count.  Overall, she appears to be doing well.  I will see her back in 2 weeks before she heads into her 13th cycle of FOLFOX/Avastin.  CT scans will be done a day before next visit to ascertain her new disease baseline after 12 cycles of treatment.  For the patient understands all the plans discussed today and is in agreement with them.  Drae Mitzel Kirby Funk, MD

## 2023-02-15 ENCOUNTER — Inpatient Hospital Stay: Payer: 59

## 2023-02-15 ENCOUNTER — Other Ambulatory Visit: Payer: Self-pay | Admitting: Oncology

## 2023-02-15 ENCOUNTER — Inpatient Hospital Stay (HOSPITAL_BASED_OUTPATIENT_CLINIC_OR_DEPARTMENT_OTHER): Payer: 59 | Admitting: Oncology

## 2023-02-15 DIAGNOSIS — C787 Secondary malignant neoplasm of liver and intrahepatic bile duct: Secondary | ICD-10-CM

## 2023-02-15 DIAGNOSIS — Z5189 Encounter for other specified aftercare: Secondary | ICD-10-CM | POA: Diagnosis not present

## 2023-02-15 DIAGNOSIS — C182 Malignant neoplasm of ascending colon: Secondary | ICD-10-CM

## 2023-02-15 DIAGNOSIS — C786 Secondary malignant neoplasm of retroperitoneum and peritoneum: Secondary | ICD-10-CM | POA: Diagnosis not present

## 2023-02-15 DIAGNOSIS — C189 Malignant neoplasm of colon, unspecified: Secondary | ICD-10-CM

## 2023-02-15 DIAGNOSIS — C7971 Secondary malignant neoplasm of right adrenal gland: Secondary | ICD-10-CM | POA: Diagnosis not present

## 2023-02-15 DIAGNOSIS — Z23 Encounter for immunization: Secondary | ICD-10-CM | POA: Diagnosis not present

## 2023-02-15 DIAGNOSIS — C7800 Secondary malignant neoplasm of unspecified lung: Secondary | ICD-10-CM

## 2023-02-15 DIAGNOSIS — Z5111 Encounter for antineoplastic chemotherapy: Secondary | ICD-10-CM | POA: Diagnosis not present

## 2023-02-15 DIAGNOSIS — C7931 Secondary malignant neoplasm of brain: Secondary | ICD-10-CM | POA: Diagnosis not present

## 2023-02-15 DIAGNOSIS — C7802 Secondary malignant neoplasm of left lung: Secondary | ICD-10-CM | POA: Diagnosis not present

## 2023-02-15 DIAGNOSIS — G629 Polyneuropathy, unspecified: Secondary | ICD-10-CM | POA: Diagnosis not present

## 2023-02-15 DIAGNOSIS — C7801 Secondary malignant neoplasm of right lung: Secondary | ICD-10-CM | POA: Diagnosis not present

## 2023-02-15 LAB — CMP (CANCER CENTER ONLY)
ALT: 19 U/L (ref 0–44)
AST: 26 U/L (ref 15–41)
Albumin: 3.4 g/dL — ABNORMAL LOW (ref 3.5–5.0)
Alkaline Phosphatase: 117 U/L (ref 38–126)
Anion gap: 9 (ref 5–15)
BUN: 18 mg/dL (ref 6–20)
CO2: 24 mmol/L (ref 22–32)
Calcium: 8.7 mg/dL — ABNORMAL LOW (ref 8.9–10.3)
Chloride: 104 mmol/L (ref 98–111)
Creatinine: 1.39 mg/dL — ABNORMAL HIGH (ref 0.44–1.00)
GFR, Estimated: 45 mL/min — ABNORMAL LOW (ref >60)
Glucose, Bld: 153 mg/dL — ABNORMAL HIGH (ref 70–99)
Potassium: 4 mmol/L (ref 3.5–5.1)
Sodium: 137 mmol/L (ref 135–145)
Total Bilirubin: 0.5 mg/dL (ref 0.3–1.2)
Total Protein: 6.3 g/dL — ABNORMAL LOW (ref 6.5–8.1)

## 2023-02-15 LAB — CBC AND DIFFERENTIAL
HCT: 35 — AB (ref 36–46)
Hemoglobin: 11.7 — AB (ref 12.0–16.0)
Neutrophils Absolute: 1.6
Platelets: 57 10*3/uL — AB (ref 150–400)
WBC: 2.9

## 2023-02-15 LAB — CBC: RBC: 3.41 — AB (ref 3.87–5.11)

## 2023-02-15 LAB — TOTAL PROTEIN, URINE DIPSTICK: Protein, ur: NEGATIVE mg/dL

## 2023-02-15 LAB — CBC W DIFFERENTIAL (~~LOC~~ CC SCANNED REPORT)

## 2023-02-16 ENCOUNTER — Encounter: Payer: Self-pay | Admitting: Oncology

## 2023-02-16 ENCOUNTER — Telehealth: Payer: Self-pay | Admitting: Oncology

## 2023-02-16 ENCOUNTER — Ambulatory Visit: Payer: 59 | Admitting: Internal Medicine

## 2023-02-16 MED FILL — Dexamethasone Sodium Phosphate Inj 100 MG/10ML: INTRAMUSCULAR | Qty: 1 | Status: AC

## 2023-02-16 MED FILL — Fluorouracil IV Soln 2.5 GM/50ML (50 MG/ML): INTRAVENOUS | Qty: 14 | Status: AC

## 2023-02-16 MED FILL — Fosaprepitant Dimeglumine For IV Infusion 150 MG (Base Eq): INTRAVENOUS | Qty: 5 | Status: AC

## 2023-02-16 MED FILL — Oxaliplatin IV Soln 50 MG/10ML: INTRAVENOUS | Qty: 23 | Status: AC

## 2023-02-16 NOTE — Telephone Encounter (Signed)
CT C/A/P has been scheduled for 02/26/23 @ 11:30; Checking in @ 10:30   LVM notifying pt of date,time and instructions.

## 2023-02-17 ENCOUNTER — Inpatient Hospital Stay: Payer: 59

## 2023-02-17 VITALS — BP 117/74 | HR 90 | Temp 98.2°F | Resp 18 | Ht 68.0 in | Wt 162.0 lb

## 2023-02-17 DIAGNOSIS — C189 Malignant neoplasm of colon, unspecified: Secondary | ICD-10-CM

## 2023-02-17 DIAGNOSIS — C7931 Secondary malignant neoplasm of brain: Secondary | ICD-10-CM | POA: Diagnosis not present

## 2023-02-17 DIAGNOSIS — C787 Secondary malignant neoplasm of liver and intrahepatic bile duct: Secondary | ICD-10-CM | POA: Diagnosis not present

## 2023-02-17 DIAGNOSIS — G629 Polyneuropathy, unspecified: Secondary | ICD-10-CM | POA: Diagnosis not present

## 2023-02-17 DIAGNOSIS — Z23 Encounter for immunization: Secondary | ICD-10-CM | POA: Diagnosis not present

## 2023-02-17 DIAGNOSIS — C7800 Secondary malignant neoplasm of unspecified lung: Secondary | ICD-10-CM

## 2023-02-17 DIAGNOSIS — Z5189 Encounter for other specified aftercare: Secondary | ICD-10-CM | POA: Diagnosis not present

## 2023-02-17 DIAGNOSIS — C7971 Secondary malignant neoplasm of right adrenal gland: Secondary | ICD-10-CM | POA: Diagnosis not present

## 2023-02-17 DIAGNOSIS — C182 Malignant neoplasm of ascending colon: Secondary | ICD-10-CM

## 2023-02-17 DIAGNOSIS — Z5111 Encounter for antineoplastic chemotherapy: Secondary | ICD-10-CM | POA: Diagnosis not present

## 2023-02-17 DIAGNOSIS — C7801 Secondary malignant neoplasm of right lung: Secondary | ICD-10-CM | POA: Diagnosis not present

## 2023-02-17 DIAGNOSIS — C7802 Secondary malignant neoplasm of left lung: Secondary | ICD-10-CM | POA: Diagnosis not present

## 2023-02-17 DIAGNOSIS — C786 Secondary malignant neoplasm of retroperitoneum and peritoneum: Secondary | ICD-10-CM | POA: Diagnosis not present

## 2023-02-17 MED ORDER — OXALIPLATIN CHEMO INJECTION 50 MG/10ML
63.7500 mg/m2 | Freq: Once | INTRAVENOUS | Status: AC
  Administered 2023-02-17: 115 mg via INTRAVENOUS
  Filled 2023-02-17: qty 20

## 2023-02-17 MED ORDER — SODIUM CHLORIDE 0.9 % IV SOLN
10.0000 mg | Freq: Once | INTRAVENOUS | Status: AC
  Administered 2023-02-17: 10 mg via INTRAVENOUS
  Filled 2023-02-17: qty 10

## 2023-02-17 MED ORDER — SODIUM CHLORIDE 0.9 % IV SOLN
5.0000 mg/kg | Freq: Once | INTRAVENOUS | Status: AC
  Administered 2023-02-17: 350 mg via INTRAVENOUS
  Filled 2023-02-17: qty 14

## 2023-02-17 MED ORDER — SODIUM CHLORIDE 0.9% FLUSH: 10.0000 mL | INTRAVENOUS | Status: DC | PRN

## 2023-02-17 MED ORDER — FLUOROURACIL CHEMO INJECTION 2.5 GM/50ML
400.0000 mg/m2 | Freq: Once | INTRAVENOUS | Status: AC
  Administered 2023-02-17: 700 mg via INTRAVENOUS
  Filled 2023-02-17: qty 14

## 2023-02-17 MED ORDER — FAMOTIDINE IN NACL 20-0.9 MG/50ML-% IV SOLN
20.0000 mg | Freq: Once | INTRAVENOUS | Status: AC
  Administered 2023-02-17: 20 mg via INTRAVENOUS
  Filled 2023-02-17: qty 50

## 2023-02-17 MED ORDER — DEXTROSE 5 % IV SOLN: Freq: Once | INTRAVENOUS | Status: AC

## 2023-02-17 MED ORDER — PALONOSETRON HCL INJECTION 0.25 MG/5ML
0.2500 mg | Freq: Once | INTRAVENOUS | Status: AC
  Administered 2023-02-17: 0.25 mg via INTRAVENOUS
  Filled 2023-02-17: qty 5

## 2023-02-17 MED ORDER — DIPHENHYDRAMINE HCL 50 MG/ML IJ SOLN
25.0000 mg | Freq: Once | INTRAMUSCULAR | Status: AC
  Administered 2023-02-17: 25 mg via INTRAVENOUS
  Filled 2023-02-17: qty 1

## 2023-02-17 MED ORDER — HEPARIN SOD (PORK) LOCK FLUSH 100 UNIT/ML IV SOLN: 500.0000 [IU] | Freq: Once | INTRAVENOUS | Status: DC | PRN

## 2023-02-17 MED ORDER — SODIUM CHLORIDE 0.9 % IV SOLN
2400.0000 mg/m2 | INTRAVENOUS | Status: DC
  Administered 2023-02-17: 4350 mg via INTRAVENOUS
  Filled 2023-02-17: qty 87

## 2023-02-17 MED ORDER — SODIUM CHLORIDE 0.9 % IV SOLN
150.0000 mg | Freq: Once | INTRAVENOUS | Status: AC
  Administered 2023-02-17: 150 mg via INTRAVENOUS
  Filled 2023-02-17: qty 150

## 2023-02-17 MED ORDER — SODIUM CHLORIDE 0.9 % IV SOLN: Freq: Once | INTRAVENOUS | Status: AC

## 2023-02-17 MED ORDER — DEXTROSE 5 % IV SOLN: Freq: Once | INTRAVENOUS | Status: DC

## 2023-02-17 NOTE — Patient Instructions (Signed)
The chemotherapy medication bag should finish at 46 hours.Oxaliplatin Injection What is this medication? OXALIPLATIN (ox AL i PLA tin) treats colorectal cancer. It works by slowing down the growth of cancer cells. This medicine may be used for other purposes; ask your health care provider or pharmacist if you have questions. COMMON BRAND NAME(S): Eloxatin What should I tell my care team before I take this medication? They need to know if you have any of these conditions: Heart disease History of irregular heartbeat or rhythm Liver disease Low blood cell levels (white cells, red cells, and platelets) Lung or breathing disease, such as asthma Take medications that treat or prevent blood clots Tingling of the fingers, toes, or other nerve disorder An unusual or allergic reaction to oxaliplatin, other medications, foods, dyes, or preservatives If you or your partner are pregnant or trying to get pregnant Breast-feeding How should I use this medication? This medication is injected into a vein. It is given by your care team in a hospital or clinic setting. Talk to your care team about the use of this medication in children. Special care may be needed. Overdosage: If you think you have taken too much of this medicine contact a poison control center or emergency room at once. NOTE: This medicine is only for you. Do not share this medicine with others. What if I miss a dose? Keep appointments for follow-up doses. It is important not to miss a dose. Call your care team if you are unable to keep an appointment. What may interact with this medication? Do not take this medication with any of the following: Cisapride Dronedarone Pimozide Thioridazine This medication may also interact with the following: Aspirin and aspirin-like medications Certain medications that treat or prevent blood clots, such as warfarin, apixaban, dabigatran, and rivaroxaban Cisplatin Cyclosporine Diuretics Medications  for infection, such as acyclovir, adefovir, amphotericin B, bacitracin, cidofovir, foscarnet, ganciclovir, gentamicin, pentamidine, vancomycin NSAIDs, medications for pain and inflammation, such as ibuprofen or naproxen Other medications that cause heart rhythm changes Pamidronate Zoledronic acid This list may not describe all possible interactions. Give your health care provider a list of all the medicines, herbs, non-prescription drugs, or dietary supplements you use. Also tell them if you smoke, drink alcohol, or use illegal drugs. Some items may interact with your medicine. What should I watch for while using this medication? Your condition will be monitored carefully while you are receiving this medication. You may need blood work while taking this medication. This medication may make you feel generally unwell. This is not uncommon as chemotherapy can affect healthy cells as well as cancer cells. Report any side effects. Continue your course of treatment even though you feel ill unless your care team tells you to stop. This medication may increase your risk of getting an infection. Call your care team for advice if you get a fever, chills, sore throat, or other symptoms of a cold or flu. Do not treat yourself. Try to avoid being around people who are sick. Avoid taking medications that contain aspirin, acetaminophen, ibuprofen, naproxen, or ketoprofen unless instructed by your care team. These medications may hide a fever. Be careful brushing or flossing your teeth or using a toothpick because you may get an infection or bleed more easily. If you have any dental work done, tell your dentist you are receiving this medication. This medication can make you more sensitive to cold. Do not drink cold drinks or use ice. Cover exposed skin before coming in contact with cold temperatures  or cold objects. When out in cold weather wear warm clothing and cover your mouth and nose to warm the air that goes into  your lungs. Tell your care team if you get sensitive to the cold. Talk to your care team if you or your partner are pregnant or think either of you might be pregnant. This medication can cause serious birth defects if taken during pregnancy and for 9 months after the last dose. A negative pregnancy test is required before starting this medication. A reliable form of contraception is recommended while taking this medication and for 9 months after the last dose. Talk to your care team about effective forms of contraception. Do not father a child while taking this medication and for 6 months after the last dose. Use a condom while having sex during this time period. Do not breastfeed while taking this medication and for 3 months after the last dose. This medication may cause infertility. Talk to your care team if you are concerned about your fertility. What side effects may I notice from receiving this medication? Side effects that you should report to your care team as soon as possible: Allergic reactions--skin rash, itching, hives, swelling of the face, lips, tongue, or throat Bleeding--bloody or black, tar-like stools, vomiting blood or brown material that looks like coffee grounds, red or dark brown urine, small red or purple spots on skin, unusual bruising or bleeding Dry cough, shortness of breath or trouble breathing Heart rhythm changes--fast or irregular heartbeat, dizziness, feeling faint or lightheaded, chest pain, trouble breathing Infection--fever, chills, cough, sore throat, wounds that don't heal, pain or trouble when passing urine, general feeling of discomfort or being unwell Liver injury--right upper belly pain, loss of appetite, nausea, light-colored stool, dark yellow or brown urine, yellowing skin or eyes, unusual weakness or fatigue Low red blood cell level--unusual weakness or fatigue, dizziness, headache, trouble breathing Muscle injury--unusual weakness or fatigue, muscle pain,  dark yellow or brown urine, decrease in amount of urine Pain, tingling, or numbness in the hands or feet Sudden and severe headache, confusion, change in vision, seizures, which may be signs of posterior reversible encephalopathy syndrome (PRES) Unusual bruising or bleeding Side effects that usually do not require medical attention (report to your care team if they continue or are bothersome): Diarrhea Nausea Pain, redness, or swelling with sores inside the mouth or throat Unusual weakness or fatigue Vomiting This list may not describe all possible side effects. Call your doctor for medical advice about side effects. You may report side effects to FDA at 1-800-FDA-1088. Where should I keep my medication? This medication is given in a hospital or clinic. It will not be stored at home. NOTE: This sheet is a summary. It may not cover all possible information. If you have questions about this medicine, talk to your doctor, pharmacist, or health care provider.  2024 [employer]/Gold Standard (2022-03-03 00:00:00) Fluorouracil Injection What is this medication? FLUOROURACIL (flure oh YOOR a sil) treats some types of cancer. It works by slowing down the growth of cancer cells. This medicine may be used for other purposes; ask your health care provider or pharmacist if you have questions. COMMON BRAND NAME(S): Adrucil What should I tell my care team before I take this medication? They need to know if you have any of these conditions: Blood disorders Dihydropyrimidine dehydrogenase (DPD) deficiency Infection, such as chickenpox, cold sores, herpes Kidney disease Liver disease Poor nutrition Recent or ongoing radiation therapy An unusual or allergic reaction to fluorouracil,  other medications, foods, dyes, or preservatives If you or your partner are pregnant or trying to get pregnant Breast-feeding How should I use this medication? This medication is injected into a vein. It is administered by  your care team in a hospital or clinic setting. Talk to your care team about the use of this medication in children. Special care may be needed. Overdosage: If you think you have taken too much of this medicine contact a poison control center or emergency room at once. NOTE: This medicine is only for you. Do not share this medicine with others. What if I miss a dose? Keep appointments for follow-up doses. It is important not to miss your dose. Call your care team if you are unable to keep an appointment. What may interact with this medication? Do not take this medication with any of the following: Live virus vaccines This medication may also interact with the following: Medications that treat or prevent blood clots, such as warfarin, enoxaparin, dalteparin This list may not describe all possible interactions. Give your health care provider a list of all the medicines, herbs, non-prescription drugs, or dietary supplements you use. Also tell them if you smoke, drink alcohol, or use illegal drugs. Some items may interact with your medicine. What should I watch for while using this medication? Your condition will be monitored carefully while you are receiving this medication. This medication may make you feel generally unwell. This is not uncommon as chemotherapy can affect healthy cells as well as cancer cells. Report any side effects. Continue your course of treatment even though you feel ill unless your care team tells you to stop. In some cases, you may be given additional medications to help with side effects. Follow all directions for their use. This medication may increase your risk of getting an infection. Call your care team for advice if you get a fever, chills, sore throat, or other symptoms of a cold or flu. Do not treat yourself. Try to avoid being around people who are sick. This medication may increase your risk to bruise or bleed. Call your care team if you notice any unusual  bleeding. Be careful brushing or flossing your teeth or using a toothpick because you may get an infection or bleed more easily. If you have any dental work done, tell your dentist you are receiving this medication. Avoid taking medications that contain aspirin, acetaminophen, ibuprofen, naproxen, or ketoprofen unless instructed by your care team. These medications may hide a fever. Do not treat diarrhea with over the counter products. Contact your care team if you have diarrhea that lasts more than 2 days or if it is severe and watery. This medication can make you more sensitive to the sun. Keep out of the sun. If you cannot avoid being in the sun, wear protective clothing and sunscreen. Do not use sun lamps, tanning beds, or tanning booths. Talk to your care team if you or your partner wish to become pregnant or think you might be pregnant. This medication can cause serious birth defects if taken during pregnancy and for 3 months after the last dose. A reliable form of contraception is recommended while taking this medication and for 3 months after the last dose. Talk to your care team about effective forms of contraception. Do not father a child while taking this medication and for 3 months after the last dose. Use a condom while having sex during this time period. Do not breastfeed while taking this medication. This medication may  cause infertility. Talk to your care team if you are concerned about your fertility. What side effects may I notice from receiving this medication? Side effects that you should report to your care team as soon as possible: Allergic reactions--skin rash, itching, hives, swelling of the face, lips, tongue, or throat Heart attack--pain or tightness in the chest, shoulders, arms, or jaw, nausea, shortness of breath, cold or clammy skin, feeling faint or lightheaded Heart failure--shortness of breath, swelling of the ankles, feet, or hands, sudden weight gain, unusual weakness  or fatigue Heart rhythm changes--fast or irregular heartbeat, dizziness, feeling faint or lightheaded, chest pain, trouble breathing High ammonia level--unusual weakness or fatigue, confusion, loss of appetite, nausea, vomiting, seizures Infection--fever, chills, cough, sore throat, wounds that don't heal, pain or trouble when passing urine, general feeling of discomfort or being unwell Low red blood cell level--unusual weakness or fatigue, dizziness, headache, trouble breathing Pain, tingling, or numbness in the hands or feet, muscle weakness, change in vision, confusion or trouble speaking, loss of balance or coordination, trouble walking, seizures Redness, swelling, and blistering of the skin over hands and feet Severe or prolonged diarrhea Unusual bruising or bleeding Side effects that usually do not require medical attention (report to your care team if they continue or are bothersome): Dry skin Headache Increased tears Nausea Pain, redness, or swelling with sores inside the mouth or throat Sensitivity to light Vomiting This list may not describe all possible side effects. Call your doctor for medical advice about side effects. You may report side effects to FDA at 1-800-FDA-1088. Where should I keep my medication? This medication is given in a hospital or clinic. It will not be stored at home. NOTE: This sheet is a summary. It may not cover all possible information. If you have questions about this medicine, talk to your doctor, pharmacist, or health care provider.  2024 [employer]/Gold Standard (2021-09-23 00:00:00) Bevacizumab Injection What is this medication? BEVACIZUMAB (be va SIZ yoo mab) treats some types of cancer. It works by blocking a protein that causes cancer cells to grow and multiply. This helps to slow or stop the spread of cancer cells. It is a monoclonal antibody. This medicine may be used for other purposes; ask your health care provider or pharmacist if you have  questions. COMMON BRAND NAME(S): Alymsys, Avastin, MVASI, Omer Jack What should I tell my care team before I take this medication? They need to know if you have any of these conditions: Blood clots Coughing up blood Having or recent surgery Heart failure High blood pressure History of a connection between 2 or more body parts that do not usually connect (fistula) History of a tear in your stomach or intestines Protein in your urine An unusual or allergic reaction to bevacizumab, other medications, foods, dyes, or preservatives Pregnant or trying to get pregnant Breast-feeding How should I use this medication? This medication is injected into a vein. It is given by your care team in a hospital or clinic setting. Talk to your care team the use of this medication in children. Special care may be needed. Overdosage: If you think you have taken too much of this medicine contact a poison control center or emergency room at once. NOTE: This medicine is only for you. Do not share this medicine with others. What if I miss a dose? Keep appointments for follow-up doses. It is important not to miss your dose. Call your care team if you are unable to keep an appointment. What may interact with this  medication? Interactions are not expected. This list may not describe all possible interactions. Give your health care provider a list of all the medicines, herbs, non-prescription drugs, or dietary supplements you use. Also tell them if you smoke, drink alcohol, or use illegal drugs. Some items may interact with your medicine. What should I watch for while using this medication? Your condition will be monitored carefully while you are receiving this medication. You may need blood work while taking this medication. This medication may make you feel generally unwell. This is not uncommon as chemotherapy can affect healthy cells as well as cancer cells. Report any side effects. Continue your course of treatment  even though you feel ill unless your care team tells you to stop. This medication may increase your risk to bruise or bleed. Call your care team if you notice any unusual bleeding. Before having surgery, talk to your care team to make sure it is ok. This medication can increase the risk of poor healing of your surgical site or wound. You will need to stop this medication for 28 days before surgery. After surgery, wait at least 28 days before restarting this medication. Make sure the surgical site or wound is healed enough before restarting this medication. Talk to your care team if questions. Talk to your care team if you may be pregnant. Serious birth defects can occur if you take this medication during pregnancy and for 6 months after the last dose. Contraception is recommended while taking this medication and for 6 months after the last dose. Your care team can help you find the option that works for you. Do not breastfeed while taking this medication and for 6 months after the last dose. This medication can cause infertility. Talk to your care team if you are concerned about your fertility. What side effects may I notice from receiving this medication? Side effects that you should report to your care team as soon as possible: Allergic reactions--skin rash, itching, hives, swelling of the face, lips, tongue, or throat Bleeding--bloody or black, tar-like stools, vomiting blood or brown material that looks like coffee grounds, red or dark brown urine, small red or purple spots on skin, unusual bruising or bleeding Blood clot--pain, swelling, or warmth in the leg, shortness of breath, chest pain Heart attack--pain or tightness in the chest, shoulders, arms, or jaw, nausea, shortness of breath, cold or clammy skin, feeling faint or lightheaded Heart failure--shortness of breath, swelling of the ankles, feet, or hands, sudden weight gain, unusual weakness or fatigue Increase in blood  pressure Infection--fever, chills, cough, sore throat, wounds that don't heal, pain or trouble when passing urine, general feeling of discomfort or being unwell Infusion reactions--chest pain, shortness of breath or trouble breathing, feeling faint or lightheaded Kidney injury--decrease in the amount of urine, swelling of the ankles, hands, or feet Stomach pain that is severe, does not go away, or gets worse Stroke--sudden numbness or weakness of the face, arm, or leg, trouble speaking, confusion, trouble walking, loss of balance or coordination, dizziness, severe headache, change in vision Sudden and severe headache, confusion, change in vision, seizures, which may be signs of posterior reversible encephalopathy syndrome (PRES) Side effects that usually do not require medical attention (report to your care team if they continue or are bothersome): Back pain Change in taste Diarrhea Dry skin Increased tears Nosebleed This list may not describe all possible side effects. Call your doctor for medical advice about side effects. You may report side effects to FDA at  1-800-FDA-1088. Where should I keep my medication? This medication is given in a hospital or clinic. It will not be stored at home. NOTE: This sheet is a summary. It may not cover all possible information. If you have questions about this medicine, talk to your doctor, pharmacist, or health care provider.  2024 [employer]/Gold Standard (2021-10-03 00:00:00) . For example, if your pump is scheduled for 46 hours and it was put on at 4:00 p.m., it should finish at 2:00 p.m. the day it is scheduled to come off regardless of your appointment time.     Estimated time to finish at noon..   If the display on your pump reads "Low Volume" and it is beeping, take the batteries out of the pump and come to the cancer center for it to be taken off.   If the pump alarms go off prior to the pump reading "Low Volume" then call [phone] and  someone can assist you.  If the plunger comes out and the chemotherapy medication is leaking out, please use your home chemo spill kit to clean up the spill. Do NOT use paper towels or other household products.  If you have problems or questions regarding your pump, please call either [phone] (24 hours a day) or the cancer center Monday-Friday 8:00 a.m.- 4:30 p.m. at the clinic number and we will assist you. If you are unable to get assistance, then go to the nearest Emergency Department and ask the staff to contact the IV team for assistance.

## 2023-02-19 ENCOUNTER — Inpatient Hospital Stay: Payer: 59

## 2023-02-19 VITALS — BP 95/54 | HR 98 | Temp 98.0°F | Resp 18 | Wt 161.2 lb

## 2023-02-19 DIAGNOSIS — C787 Secondary malignant neoplasm of liver and intrahepatic bile duct: Secondary | ICD-10-CM

## 2023-02-19 DIAGNOSIS — G629 Polyneuropathy, unspecified: Secondary | ICD-10-CM | POA: Diagnosis not present

## 2023-02-19 DIAGNOSIS — C189 Malignant neoplasm of colon, unspecified: Secondary | ICD-10-CM | POA: Diagnosis not present

## 2023-02-19 DIAGNOSIS — Z23 Encounter for immunization: Secondary | ICD-10-CM | POA: Diagnosis not present

## 2023-02-19 DIAGNOSIS — C182 Malignant neoplasm of ascending colon: Secondary | ICD-10-CM

## 2023-02-19 DIAGNOSIS — C7802 Secondary malignant neoplasm of left lung: Secondary | ICD-10-CM | POA: Diagnosis not present

## 2023-02-19 DIAGNOSIS — C7971 Secondary malignant neoplasm of right adrenal gland: Secondary | ICD-10-CM | POA: Diagnosis not present

## 2023-02-19 DIAGNOSIS — Z5189 Encounter for other specified aftercare: Secondary | ICD-10-CM | POA: Diagnosis not present

## 2023-02-19 DIAGNOSIS — C786 Secondary malignant neoplasm of retroperitoneum and peritoneum: Secondary | ICD-10-CM | POA: Diagnosis not present

## 2023-02-19 DIAGNOSIS — C7931 Secondary malignant neoplasm of brain: Secondary | ICD-10-CM | POA: Diagnosis not present

## 2023-02-19 DIAGNOSIS — Z5111 Encounter for antineoplastic chemotherapy: Secondary | ICD-10-CM | POA: Diagnosis not present

## 2023-02-19 DIAGNOSIS — C7801 Secondary malignant neoplasm of right lung: Secondary | ICD-10-CM | POA: Diagnosis not present

## 2023-02-19 MED ORDER — SODIUM CHLORIDE 0.9% FLUSH
10.0000 mL | INTRAVENOUS | Status: DC | PRN
  Administered 2023-02-19: 10 mL

## 2023-02-19 MED ORDER — SODIUM CHLORIDE 0.9 % IV SOLN: Freq: Once | INTRAVENOUS | Status: AC

## 2023-02-19 MED ORDER — HEPARIN SOD (PORK) LOCK FLUSH 100 UNIT/ML IV SOLN
500.0000 [IU] | Freq: Once | INTRAVENOUS | Status: AC | PRN
  Administered 2023-02-19: 500 [IU]

## 2023-02-19 NOTE — Patient Instructions (Signed)
Managing Chemotherapy Side Effects, Adult Chemotherapy is a treatment that uses medicine to kill cancer cells. However, in addition to killing cancer cells, the medicines can also damage healthy cells. The damage to healthy cells can lead to side effects. The exact side effects depend on the specific medicines used. Most of the side effects of chemotherapy go away once treatment is finished. Until then, work closely with your health care providers and take an active role in managing your side effects. What are common side effects of chemotherapy? Increased risk of infection, bruising, or bleeding. Nausea and vomiting. Constipation or diarrhea. Loss of appetite. Hair loss. Mouth or throat sores. Tiredness (fatigue). Tingling, pain, or numbness in the hands and feet. Dry, sensitive, itchy, or sore skin. Sleep disturbances, such as excessive sleepiness. Confusion, anxiety, or mood swings. Memory changes. How to manage the side effects of chemotherapy Medicines Take over-the-counter and prescription medicines only as told by your health care provider. Talk with your health care provider before taking vitamins, herbs, supplements, or over-the-counter medicines. Some of these can interfere with chemotherapy. Activity Get plenty of rest. Get regular exercise by doing activities such as walking, gentle yoga, or tai chi. Return to your normal activities as told by your health care provider. Ask your health care provider what activities are safe for you. Eating and drinking  Talk to a dietitian about what you should eat and drink during cancer treatment. Drink enough fluid to keep your urine pale yellow. If you have side effects that affect eating, these tips may help: Eat smaller meals and snacks often. Drink high-nutrition and high-calorie shakes or supplements. Choose bland and soft foods that are easy to eat. Do not eat foods that are hot, spicy, or hard to swallow. Do not eat raw or  undercooked meat, eggs, or seafood. Always wash fresh fruits and vegetables well before eating them. Skin care If you have sore or itchy skin: Wear soft, comfortable clothing. Apply creams and ointments to your skin as told by your health care provider. If you lose your hair, consider wearing a wig, hat, or scarf to cover your head. You may want to have someone shave your head as you start to lose hair. During outdoor activities, protect your head and skin from the sun by using sunscreen with an SPF of 30 or higher or by wearing protective clothing and a hat. Meet with a hair and skin care specialist for makeup and skin care tips. Apply sunscreen to your scalp as told by your health care provider. General tips Learn as much as you can about your condition. If you are struggling emotionally, talk with a mental health care provider or join a support group. Keep all follow-up visits. This is important. How to prevent infection and bleeding Chemotherapy may lower your blood counts and put you at risk for infection and bleeding. Here are some ways to help prevent problems. Vaccines Talk to your health care provider about vaccines. You should not get any live vaccines, such as the polio, MMR, chickenpox, and shingles vaccines until your health care provider says that it is safe to do so. Do not be around people who have had live vaccines for as long as your health care provider recommends. Make sure you get a yearly flu shot. People who will be near you should also get a yearly flu shot. Social activity Stay away from crowded places where you could be exposed to germs. Do not be around people who may be sick or  people who have symptoms of a fever until they have been fever-free for at least 24 hours. Do not share food, cups, straws, or utensils with other people. Wear a mask when outside the home if your blood counts are low. Cleanliness  Wash your hands often for at least 20 seconds. Also make  sure that other members of your household wash their hands often. Brush your teeth twice daily using a soft toothbrush. Use mouth rinse only as told by your health care provider. Take a bath or shower daily unless your health care provider gives different instructions. General tips Take your temperature regularly, especially if you have chills or feel warm. Check with your health care provider: Before you travel. Before you have a dental procedure. Before you use a swimming pool, hot tub, or swim in a lake or ocean. If you get chemotherapy through an IV or port, check the site every day for signs of infection. Check for redness, swelling, pain, fluid, and warmth. Avoid activities that put you at risk for injury or bleeding. Use an electric razor to shave instead of a blade. Questions to ask your health care provider What are the most common side effects of my treatment? How will they affect my daily life? What can I do to manage them? What are some possible long-term side effects? What are possible complications? What support services are available? What number can I call with questions or concerns? Where to find support Cancer affects the entire family. Find out what family support resources are available from your cancer treatment center. For more support, turn to: Your cancer care team. Friends and family. Your religious community. Other people with cancer. Community-based or online support groups. Where to find more information National Cancer Institute: www.cancer.gov American Cancer Society: www.cancer.org Contact a health care provider if: You bleed or bruise more often. You notice blood in your urine or stool. You have any of these symptoms: A skin rash, or dry or itchy skin. A headache or stiff neck. Cold or flu symptoms. A cough. Persistent nausea or vomiting. Persistent diarrhea. Frequent urination, burning when passing urine, or foul-smelling urine. You cannot eat  because of mouth or throat pain. You are sad, confused, anxious, or depressed. Get help right away if: You have any of these symptoms: A fever or chills. Your health care provider should know about this right away. Redness, swelling, pain, fluid, or warmth near an IV site. Bleeding that you cannot stop. A seizure. You cannot swallow. You have chest pain. You have trouble breathing. A family member or caregiver should get help right away if you have a sudden or unusual change in behavior. These symptoms may be an emergency. Get help right away. Call 911. Do not wait to see if the symptoms will go away. Do not drive yourself to the hospital. Summary Chemotherapy is a treatment that uses medicine to kill cancer cells and can cause side effects. The specific side effects depend on the specific medicines used. Learn as much as you can about your condition. Ask about side effects to watch for and how to treat them. Seek out support and resources from others. Find out what family support resources are available from your cancer treatment center. Let your health care provider know if you notice any new, unusual, or worsening symptoms, especially fever or chills. This information is not intended to replace advice given to you by your health care provider. Make sure you discuss any questions you have with your health  care provider. Document Revised: 05/08/2021 Document Reviewed: 05/08/2021 Elsevier Patient Education  2024 [employer].

## 2023-02-22 ENCOUNTER — Inpatient Hospital Stay: Payer: 59

## 2023-02-22 VITALS — BP 114/73 | HR 99 | Temp 98.4°F | Resp 18 | Ht 68.0 in | Wt 160.0 lb

## 2023-02-22 DIAGNOSIS — C7971 Secondary malignant neoplasm of right adrenal gland: Secondary | ICD-10-CM | POA: Diagnosis not present

## 2023-02-22 DIAGNOSIS — C7802 Secondary malignant neoplasm of left lung: Secondary | ICD-10-CM | POA: Diagnosis not present

## 2023-02-22 DIAGNOSIS — C182 Malignant neoplasm of ascending colon: Secondary | ICD-10-CM

## 2023-02-22 DIAGNOSIS — C7931 Secondary malignant neoplasm of brain: Secondary | ICD-10-CM | POA: Diagnosis not present

## 2023-02-22 DIAGNOSIS — C787 Secondary malignant neoplasm of liver and intrahepatic bile duct: Secondary | ICD-10-CM | POA: Diagnosis not present

## 2023-02-22 DIAGNOSIS — C786 Secondary malignant neoplasm of retroperitoneum and peritoneum: Secondary | ICD-10-CM | POA: Diagnosis not present

## 2023-02-22 DIAGNOSIS — Z5111 Encounter for antineoplastic chemotherapy: Secondary | ICD-10-CM | POA: Diagnosis not present

## 2023-02-22 DIAGNOSIS — Z5189 Encounter for other specified aftercare: Secondary | ICD-10-CM | POA: Diagnosis not present

## 2023-02-22 DIAGNOSIS — G629 Polyneuropathy, unspecified: Secondary | ICD-10-CM | POA: Diagnosis not present

## 2023-02-22 DIAGNOSIS — Z23 Encounter for immunization: Secondary | ICD-10-CM | POA: Diagnosis not present

## 2023-02-22 DIAGNOSIS — C189 Malignant neoplasm of colon, unspecified: Secondary | ICD-10-CM | POA: Diagnosis not present

## 2023-02-22 DIAGNOSIS — C7801 Secondary malignant neoplasm of right lung: Secondary | ICD-10-CM | POA: Diagnosis not present

## 2023-02-22 MED ORDER — FILGRASTIM-SNDZ 480 MCG/0.8ML IJ SOSY
480.0000 ug | PREFILLED_SYRINGE | Freq: Once | INTRAMUSCULAR | Status: AC
  Administered 2023-02-22: 480 ug via SUBCUTANEOUS
  Filled 2023-02-22: qty 0.8

## 2023-02-23 ENCOUNTER — Other Ambulatory Visit: Payer: 59

## 2023-02-23 ENCOUNTER — Inpatient Hospital Stay: Payer: 59

## 2023-02-23 VITALS — BP 110/79 | HR 100 | Temp 98.4°F | Resp 18 | Ht 68.0 in | Wt 160.0 lb

## 2023-02-23 DIAGNOSIS — C786 Secondary malignant neoplasm of retroperitoneum and peritoneum: Secondary | ICD-10-CM | POA: Diagnosis not present

## 2023-02-23 DIAGNOSIS — C189 Malignant neoplasm of colon, unspecified: Secondary | ICD-10-CM | POA: Diagnosis not present

## 2023-02-23 DIAGNOSIS — C7800 Secondary malignant neoplasm of unspecified lung: Secondary | ICD-10-CM

## 2023-02-23 DIAGNOSIS — G629 Polyneuropathy, unspecified: Secondary | ICD-10-CM | POA: Diagnosis not present

## 2023-02-23 DIAGNOSIS — Z5111 Encounter for antineoplastic chemotherapy: Secondary | ICD-10-CM | POA: Diagnosis not present

## 2023-02-23 DIAGNOSIS — Z5189 Encounter for other specified aftercare: Secondary | ICD-10-CM | POA: Diagnosis not present

## 2023-02-23 DIAGNOSIS — C787 Secondary malignant neoplasm of liver and intrahepatic bile duct: Secondary | ICD-10-CM | POA: Diagnosis not present

## 2023-02-23 DIAGNOSIS — C7931 Secondary malignant neoplasm of brain: Secondary | ICD-10-CM | POA: Diagnosis not present

## 2023-02-23 DIAGNOSIS — Z23 Encounter for immunization: Secondary | ICD-10-CM | POA: Diagnosis not present

## 2023-02-23 DIAGNOSIS — C7971 Secondary malignant neoplasm of right adrenal gland: Secondary | ICD-10-CM | POA: Diagnosis not present

## 2023-02-23 DIAGNOSIS — C7801 Secondary malignant neoplasm of right lung: Secondary | ICD-10-CM | POA: Diagnosis not present

## 2023-02-23 DIAGNOSIS — C182 Malignant neoplasm of ascending colon: Secondary | ICD-10-CM

## 2023-02-23 DIAGNOSIS — C7802 Secondary malignant neoplasm of left lung: Secondary | ICD-10-CM | POA: Diagnosis not present

## 2023-02-23 MED ORDER — FILGRASTIM-SNDZ 480 MCG/0.8ML IJ SOSY
480.0000 ug | PREFILLED_SYRINGE | Freq: Once | INTRAMUSCULAR | Status: AC
  Administered 2023-02-23: 480 ug via SUBCUTANEOUS
  Filled 2023-02-23: qty 0.8

## 2023-02-24 ENCOUNTER — Inpatient Hospital Stay: Payer: 59

## 2023-02-24 VITALS — BP 117/76 | HR 96 | Temp 98.1°F | Resp 18 | Ht 68.0 in | Wt 159.0 lb

## 2023-02-24 DIAGNOSIS — C189 Malignant neoplasm of colon, unspecified: Secondary | ICD-10-CM | POA: Diagnosis not present

## 2023-02-24 DIAGNOSIS — Z5111 Encounter for antineoplastic chemotherapy: Secondary | ICD-10-CM | POA: Diagnosis not present

## 2023-02-24 DIAGNOSIS — Z5189 Encounter for other specified aftercare: Secondary | ICD-10-CM | POA: Diagnosis not present

## 2023-02-24 DIAGNOSIS — C7801 Secondary malignant neoplasm of right lung: Secondary | ICD-10-CM | POA: Diagnosis not present

## 2023-02-24 DIAGNOSIS — C182 Malignant neoplasm of ascending colon: Secondary | ICD-10-CM

## 2023-02-24 DIAGNOSIS — C787 Secondary malignant neoplasm of liver and intrahepatic bile duct: Secondary | ICD-10-CM | POA: Diagnosis not present

## 2023-02-24 DIAGNOSIS — C7971 Secondary malignant neoplasm of right adrenal gland: Secondary | ICD-10-CM | POA: Diagnosis not present

## 2023-02-24 DIAGNOSIS — C7931 Secondary malignant neoplasm of brain: Secondary | ICD-10-CM | POA: Diagnosis not present

## 2023-02-24 DIAGNOSIS — C7802 Secondary malignant neoplasm of left lung: Secondary | ICD-10-CM | POA: Diagnosis not present

## 2023-02-24 DIAGNOSIS — Z23 Encounter for immunization: Secondary | ICD-10-CM | POA: Diagnosis not present

## 2023-02-24 DIAGNOSIS — C786 Secondary malignant neoplasm of retroperitoneum and peritoneum: Secondary | ICD-10-CM | POA: Diagnosis not present

## 2023-02-24 DIAGNOSIS — G629 Polyneuropathy, unspecified: Secondary | ICD-10-CM | POA: Diagnosis not present

## 2023-02-24 MED ORDER — FILGRASTIM-SNDZ 480 MCG/0.8ML IJ SOSY
480.0000 ug | PREFILLED_SYRINGE | Freq: Once | INTRAMUSCULAR | Status: AC
  Administered 2023-02-24: 480 ug via SUBCUTANEOUS
  Filled 2023-02-24: qty 0.8

## 2023-02-24 NOTE — Patient Instructions (Signed)
Filgrastim Injection What is this medication? FILGRASTIM (fil GRA stim) lowers the risk of infection in people who are receiving chemotherapy. It works by helping your body make more white blood cells, which protects your body from infection. It may also be used to help people who have been exposed to high doses of radiation. It can be used to help prepare your body before a stem cell transplant. It works by helping your bone marrow make and release stem cells into the blood. This medicine may be used for other purposes; ask your health care provider or pharmacist if you have questions. COMMON BRAND NAME(S): Neupogen, Nivestym, Releuko, Zarxio What should I tell my care team before I take this medication? They need to know if you have any of these conditions: History of blood diseases, such as sickle cell anemia Kidney disease Recent or ongoing radiation An unusual or allergic reaction to filgrastim, pegfilgrastim, latex, rubber, other medications, foods, dyes, or preservatives Pregnant or trying to get pregnant Breast-feeding How should I use this medication? This medication is injected under the skin or into a vein. It is usually given by your care team in a hospital or clinic setting. It may be given at home. If you get this medication at home, you will be taught how to prepare and give it. Use exactly as directed. Take it as directed on the prescription label at the same time every day. Keep taking it unless your care team tells you to stop. It is important that you put your used needles and syringes in a special sharps container. Do not put them in a trash can. If you do not have a sharps container, call your pharmacist or care team to get one. This medication comes with INSTRUCTIONS FOR USE. Ask your pharmacist for directions on how to use this medication. Read the information carefully. Talk to your pharmacist or care team if you have questions. Talk to your care team about the use of this  medication in children. While it may be prescribed for children for selected conditions, precautions do apply. Overdosage: If you think you have taken too much of this medicine contact a poison control center or emergency room at once. NOTE: This medicine is only for you. Do not share this medicine with others. What if I miss a dose? It is important not to miss any doses. Talk to your care team about what to do if you miss a dose. What may interact with this medication? Medications that may cause a release of neutrophils, such as lithium This list may not describe all possible interactions. Give your health care provider a list of all the medicines, herbs, non-prescription drugs, or dietary supplements you use. Also tell them if you smoke, drink alcohol, or use illegal drugs. Some items may interact with your medicine. What should I watch for while using this medication? Your condition will be monitored carefully while you are receiving this medication. You may need bloodwork while taking this medication. Talk to your care team about your risk of cancer. You may be more at risk for certain types of cancer if you take this medication. What side effects may I notice from receiving this medication? Side effects that you should report to your care team as soon as possible: Allergic reactions--skin rash, itching, hives, swelling of the face, lips, tongue, or throat Capillary leak syndrome--stomach or muscle pain, unusual weakness or fatigue, feeling faint or lightheaded, decrease in the amount of urine, swelling of the ankles, hands, or   feet, trouble breathing High white blood cell level--fever, fatigue, trouble breathing, night sweats, change in vision, weight loss Inflammation of the aorta--fever, fatigue, back, chest, or stomach pain, severe headache Kidney injury (glomerulonephritis)--decrease in the amount of urine, red or dark brown urine, foamy or bubbly urine, swelling of the ankles, hands, or  feet Shortness of breath or trouble breathing Spleen injury--pain in upper left stomach or shoulder Unusual bruising or bleeding Side effects that usually do not require medical attention (report to your care team if they continue or are bothersome): Back pain Bone pain Fatigue Fever Headache Nausea This list may not describe all possible side effects. Call your doctor for medical advice about side effects. You may report side effects to FDA at 1-800-FDA-1088. Where should I keep my medication? Keep out of the reach of children and pets. Keep this medication in the original packaging until you are ready to take it. Protect from light. See product for storage information. Each product may have different instructions. Get rid of any unused medication after the expiration date. To get rid of medications that are no longer needed or have expired: Take the medication to a medications take-back program. Check with your pharmacy or law enforcement to find a location. If you cannot return the medication, ask your pharmacist or care team how to get rid of this medication safely. NOTE: This sheet is a summary. It may not cover all possible information. If you have questions about this medicine, talk to your doctor, pharmacist, or health care provider.  2024 Elsevier/Gold Standard (2021-10-09 00:00:00)  

## 2023-02-25 ENCOUNTER — Telehealth: Payer: Self-pay | Admitting: *Deleted

## 2023-02-25 NOTE — Telephone Encounter (Signed)
PC to patient, no answer, left VM - informed patient she has appointment scheduled on 03/02/23 which needs to be changed because her brain MRI isn't until 04/03/23.  Dr Barbaraann Cao will need a follow up after the MRI so he can discuss results.  Instructed patient to contact this office with any questions/concerns, [phone].  Scheduling message sent.

## 2023-02-26 ENCOUNTER — Telehealth: Payer: Self-pay | Admitting: Internal Medicine

## 2023-02-26 NOTE — Telephone Encounter (Signed)
Scheduled per 09/26 staff message, patient has been called and voicemail was left.

## 2023-03-01 ENCOUNTER — Inpatient Hospital Stay (HOSPITAL_BASED_OUTPATIENT_CLINIC_OR_DEPARTMENT_OTHER): Payer: 59 | Admitting: Oncology

## 2023-03-01 ENCOUNTER — Other Ambulatory Visit (HOSPITAL_BASED_OUTPATIENT_CLINIC_OR_DEPARTMENT_OTHER): Payer: Self-pay

## 2023-03-01 ENCOUNTER — Other Ambulatory Visit (HOSPITAL_COMMUNITY): Payer: Self-pay

## 2023-03-01 ENCOUNTER — Other Ambulatory Visit: Payer: 59

## 2023-03-01 DIAGNOSIS — C189 Malignant neoplasm of colon, unspecified: Secondary | ICD-10-CM | POA: Diagnosis not present

## 2023-03-01 DIAGNOSIS — C182 Malignant neoplasm of ascending colon: Secondary | ICD-10-CM | POA: Diagnosis not present

## 2023-03-01 DIAGNOSIS — N200 Calculus of kidney: Secondary | ICD-10-CM | POA: Diagnosis not present

## 2023-03-01 DIAGNOSIS — C787 Secondary malignant neoplasm of liver and intrahepatic bile duct: Secondary | ICD-10-CM

## 2023-03-01 DIAGNOSIS — C7800 Secondary malignant neoplasm of unspecified lung: Secondary | ICD-10-CM

## 2023-03-01 LAB — CBC AND DIFFERENTIAL
HCT: 35 — AB (ref 36–46)
Hemoglobin: 11.8 — AB (ref 12.0–16.0)
Neutrophils Absolute: 1.39
Platelets: 52 10*3/uL — AB (ref 150–400)
WBC: 3.4

## 2023-03-01 LAB — BASIC METABOLIC PANEL
BUN: 16 (ref 4–21)
CO2: 25 — AB (ref 13–22)
Chloride: 108 (ref 99–108)
Creatinine: 1.4 — AB (ref 0.5–1.1)
Glucose: 84
Potassium: 3.8 meq/L (ref 3.5–5.1)
Sodium: 139 (ref 137–147)

## 2023-03-01 LAB — HEPATIC FUNCTION PANEL
ALT: 28 U/L (ref 7–35)
AST: 31 (ref 13–35)
Alkaline Phosphatase: 119 (ref 25–125)
Bilirubin, Total: 0.2

## 2023-03-01 LAB — CBC: RBC: 3.46 — AB (ref 3.87–5.11)

## 2023-03-01 LAB — COMPREHENSIVE METABOLIC PANEL
Albumin: 3.6 (ref 3.5–5.0)
Calcium: 9.2 (ref 8.7–10.7)

## 2023-03-01 NOTE — Progress Notes (Signed)
[facility]  [address] [place],  [place]  [address] [phone]  Clinic Day:  03/01/2023  Referring physician: Alinda Deem, MD  HISTORY OF PRESENT ILLNESS:  The patient is a 57 y.o. female with  metastatic colon cancer, which includes spread of disease to her abdominal cavity, lungs, brain and right adrenal gland.  She comes in today to go over her CT scans to ascertain her new disease baseline after 12 cycles of  FOLFOX/Avastin.  The patient claims to have tolerated her 12th cycle of FOLFOX/Avastin fairly well.  She has started to notice numbness on her fingertips, but this is not affected her fine motor skills with her fingers.  Of note, her oxaliplatin dose was reduced with her 12th cycle of chemotherapy.  She believes she did have less diarrhea over these past few weeks.  She continues to deny having any new symptoms or findings which concern her for overt signs of disease progression.  With respect to her colon cancer history, she is status post a right hemicolectomy in early October 2018, followed by 12 cycles of adjuvant FOLFOX chemotherapy, which were completed in April 2019.  In December 2021, she underwent a left cerebellar metastasectomy, whose pathology was consistent with metastatic colon cancer.  Tumor testing did come back MMR normal. CT scans revealed evidence of her cancer being in multiple locations, for which she took 40 cycles of FOLFIRI/Avastin.  As follow-up scans revealed liver metastasis, she was placed back on FOLFOX/Avastin, for which she continues to take.  PHYSICAL EXAM:  Blood pressure 107/63, pulse 89, temperature 98.4 F (36.9 C), resp. rate 16, height 5\' 8"  (1.727 m), weight 162 lb 14.4 oz (73.9 kg), last menstrual period 02/22/2019, SpO2 98%. Body mass index is 24.77 kg/m.  Performance status (ECOG): 1 - Symptomatic but completely ambulatory  Physical Exam Vitals and nursing note reviewed.  Constitutional:       General: She is not in acute distress.    Appearance: Normal appearance.  HENT:     Head: Normocephalic and atraumatic.     Mouth/Throat:     Mouth: Mucous membranes are moist.     Pharynx: Oropharynx is clear. No oropharyngeal exudate or posterior oropharyngeal erythema.  Eyes:     General: No scleral icterus.    Extraocular Movements: Extraocular movements intact.     Conjunctiva/sclera: Conjunctivae normal.     Pupils: Pupils are equal, round, and reactive to light.  Cardiovascular:     Rate and Rhythm: Normal rate and regular rhythm.     Heart sounds: Normal heart sounds. No murmur heard.    No friction rub. No gallop.  Pulmonary:     Effort: Pulmonary effort is normal.     Breath sounds: Normal breath sounds. No wheezing, rhonchi or rales.  Abdominal:     General: There is no distension.     Palpations: Abdomen is soft. There is no hepatomegaly, splenomegaly or mass.     Tenderness: There is no abdominal tenderness.  Musculoskeletal:        General: Normal range of motion.     Cervical back: Normal range of motion and neck supple. No tenderness.     Right lower leg: No edema.     Left lower leg: No edema.  Lymphadenopathy:     Cervical: No cervical adenopathy.     Upper Body:     Right upper body: No supraclavicular or axillary adenopathy.     Left upper body: No  supraclavicular or axillary adenopathy.     Lower Body: No right inguinal adenopathy. No left inguinal adenopathy.  Skin:    General: Skin is warm and dry.     Coloration: Skin is not jaundiced.     Findings: No rash.  Neurological:     Mental Status: She is alert and oriented to person, place, and time.     Cranial Nerves: No cranial nerve deficit.  Psychiatric:        Mood and Affect: Mood normal.        Behavior: Behavior normal.        Thought Content: Thought content normal.   SCANS: CT scans of her chest/abdomen/pelvis revealed the following: FINDINGS: CT CHEST FINDINGS  Cardiovascular: Right IJ  Port-A-Cath terminates at the SVC RA junction. Heart is at the upper limits of normal in size to mildly enlarged. No pericardial effusion.  Mediastinum/Nodes: No pathologically enlarged mediastinal, hilar or axillary lymph nodes. Esophagus is grossly unremarkable.  Lungs/Pleura: Spiculated areas of nodular consolidation in the lungs bilaterally are unchanged from 12/18/2022. Index lesion in the posterior segment right upper lobe measures 1.4 x 2.7 cm (301/42). No new nodules. No pleural fluid. Airway is unremarkable.  Musculoskeletal: Degenerative changes in the spine. No worrisome lytic or sclerotic lesions.  CT ABDOMEN PELVIS FINDINGS  Hepatobiliary: Liver is decreased in attenuation diffusely. Subtle hypoattenuating lesions in the caudate and right hepatic lobe measures 5 mm (2/54) and 9 mm (2/61), stable from 12/18/2022. Focal low attenuation along the falciform ligament (2/55), likely focal fat. Liver and gallbladder are otherwise unremarkable. No biliary ductal dilatation.  Pancreas: Negative.  Spleen: Negative.  Adrenals/Urinary Tract: Adrenal glands and kidneys are unremarkable. Ureters are decompressed. Bladder is low in volume.  Stomach/Bowel: Stomach and small bowel are unremarkable. Right hemicolectomy. Colon is otherwise unremarkable.  Vascular/Lymphatic: Atherosclerotic calcification of the aorta. No pathologically enlarged lymph nodes.  Reproductive: Hysterectomy. No adnexal mass.  Other: Mild laxity of the midline ventral abdominal wall, deep to an incisional scar. No free fluid. Mesenteries and peritoneum are unremarkable.  Musculoskeletal: Degenerative changes in the spine. No worrisome lytic or sclerotic lesions.  IMPRESSION: 1. Stable pulmonary and hepatic metastatic disease. No evidence of disease progression. 2. Hepatic steatosis. 3. Aortic atherosclerosis (ICD10-I70.0).  LABS:       Latest Ref Rng & Units 02/15/2023   12:00 AM 01/29/2023    12:00 AM 01/18/2023   12:00 AM  CBC  WBC  2.9     6.3     3.7      Hemoglobin 12.0 - 16.0 11.7     10.9     12.1      Hematocrit 36 - 46 35     32     36      Platelets 150 - 400 K/uL 57     68     106         This result is from an external source.      Latest Ref Rng & Units 02/15/2023    2:03 PM 01/29/2023    1:37 PM 01/18/2023    2:00 PM  CMP  Glucose 70 - 99 mg/dL 161  096  045   BUN 6 - 20 mg/dL 18  23  17    Creatinine 0.44 - 1.00 mg/dL 4.09  8.11  9.14   Sodium 135 - 145 mmol/L 137  135  144   Potassium 3.5 - 5.1 mmol/L 4.0  3.8  4.7   Chloride 98 -  111 mmol/L 104  104  106   CO2 22 - 32 mmol/L 24  23  24    Calcium 8.9 - 10.3 mg/dL 8.7  8.6  9.7   Total Protein 6.5 - 8.1 g/dL 6.3  6.5  6.6   Total Bilirubin 0.3 - 1.2 mg/dL 0.5  0.6  0.5   Alkaline Phos 38 - 126 U/L 117  147  126   AST 15 - 41 U/L 26  20  25    ALT 0 - 44 U/L 19  18  20     ASSESSMENT & PLAN:  Assessment/Plan:  A 57 y.o. female with metastatic colon cancer.  In clinic today, I went over all of her CT scan images with her, for which she could  see that her disease is stable.  From my vantage point, I actually believe her lung and liver lesions are less conspicuous now than what they were previously.  She definitely could see that there is no evidence of disease progression.  Based upon this, she will proceed with her 13th cycle of FOLFOX/Avastin this week.  As she is moderately neutropenic, 3 doses of Christeen Douglas will be given to bolster her white count.  Although her platelets are low, she will still proceed with her 13th cycle of chemotherapy this week.  However, if they continue to fall, future cycles of chemotherapy may need to be spaced out every 3 weeks to give her bone marrow an extra week to recuperate from each successive cycle of chemotherapy.  Otherwise, the patient is clinically doing very well.  I will see her back in 2 weeks before she heads into her 14th cycle of FOLFOX/Avastin.  The patient understands all  the plans discussed today and is in agreement with them.  Rettie Laird Kirby Funk, MD

## 2023-03-02 ENCOUNTER — Encounter: Payer: Self-pay | Admitting: Oncology

## 2023-03-02 ENCOUNTER — Ambulatory Visit: Payer: 59 | Admitting: Internal Medicine

## 2023-03-02 LAB — CEA: CEA: 9.4

## 2023-03-02 MED FILL — Fluorouracil IV Soln 5 GM/100ML (50 MG/ML): INTRAVENOUS | Qty: 87 | Status: AC

## 2023-03-02 MED FILL — Fosaprepitant Dimeglumine For IV Infusion 150 MG (Base Eq): INTRAVENOUS | Qty: 5 | Status: AC

## 2023-03-02 MED FILL — Oxaliplatin IV Soln 50 MG/10ML: INTRAVENOUS | Qty: 23 | Status: AC

## 2023-03-02 MED FILL — Fluorouracil IV Soln 2.5 GM/50ML (50 MG/ML): INTRAVENOUS | Qty: 14 | Status: AC

## 2023-03-02 MED FILL — Dexamethasone Sodium Phosphate Inj 100 MG/10ML: INTRAMUSCULAR | Qty: 1 | Status: AC

## 2023-03-03 ENCOUNTER — Inpatient Hospital Stay: Payer: 59 | Attending: Oncology

## 2023-03-03 VITALS — BP 124/75 | HR 95 | Temp 98.0°F | Resp 18 | Ht 68.0 in | Wt 161.0 lb

## 2023-03-03 DIAGNOSIS — Z5111 Encounter for antineoplastic chemotherapy: Secondary | ICD-10-CM | POA: Diagnosis not present

## 2023-03-03 DIAGNOSIS — C787 Secondary malignant neoplasm of liver and intrahepatic bile duct: Secondary | ICD-10-CM | POA: Diagnosis not present

## 2023-03-03 DIAGNOSIS — C189 Malignant neoplasm of colon, unspecified: Secondary | ICD-10-CM | POA: Insufficient documentation

## 2023-03-03 DIAGNOSIS — C7802 Secondary malignant neoplasm of left lung: Secondary | ICD-10-CM | POA: Insufficient documentation

## 2023-03-03 DIAGNOSIS — C7971 Secondary malignant neoplasm of right adrenal gland: Secondary | ICD-10-CM | POA: Insufficient documentation

## 2023-03-03 DIAGNOSIS — C7931 Secondary malignant neoplasm of brain: Secondary | ICD-10-CM | POA: Insufficient documentation

## 2023-03-03 DIAGNOSIS — Z5189 Encounter for other specified aftercare: Secondary | ICD-10-CM | POA: Diagnosis not present

## 2023-03-03 DIAGNOSIS — C182 Malignant neoplasm of ascending colon: Secondary | ICD-10-CM

## 2023-03-03 DIAGNOSIS — C7989 Secondary malignant neoplasm of other specified sites: Secondary | ICD-10-CM | POA: Diagnosis not present

## 2023-03-03 DIAGNOSIS — C7801 Secondary malignant neoplasm of right lung: Secondary | ICD-10-CM | POA: Insufficient documentation

## 2023-03-03 MED ORDER — SODIUM CHLORIDE 0.9 % IV SOLN
10.0000 mg | Freq: Once | INTRAVENOUS | Status: AC
  Administered 2023-03-03: 10 mg via INTRAVENOUS
  Filled 2023-03-03: qty 10

## 2023-03-03 MED ORDER — FAMOTIDINE IN NACL 20-0.9 MG/50ML-% IV SOLN
20.0000 mg | Freq: Once | INTRAVENOUS | Status: AC
  Administered 2023-03-03: 20 mg via INTRAVENOUS
  Filled 2023-03-03: qty 50

## 2023-03-03 MED ORDER — PALONOSETRON HCL INJECTION 0.25 MG/5ML
0.2500 mg | Freq: Once | INTRAVENOUS | Status: AC
  Administered 2023-03-03: 0.25 mg via INTRAVENOUS
  Filled 2023-03-03: qty 5

## 2023-03-03 MED ORDER — DIPHENHYDRAMINE HCL 50 MG/ML IJ SOLN
25.0000 mg | Freq: Once | INTRAMUSCULAR | Status: AC
  Administered 2023-03-03: 25 mg via INTRAVENOUS
  Filled 2023-03-03: qty 1

## 2023-03-03 MED ORDER — SODIUM CHLORIDE 0.9% FLUSH: 10.0000 mL | INTRAVENOUS | Status: DC | PRN

## 2023-03-03 MED ORDER — HEPARIN SOD (PORK) LOCK FLUSH 100 UNIT/ML IV SOLN: 500.0000 [IU] | Freq: Once | INTRAVENOUS | Status: DC | PRN

## 2023-03-03 MED ORDER — FLUOROURACIL CHEMO INJECTION 2.5 GM/50ML
400.0000 mg/m2 | Freq: Once | INTRAVENOUS | Status: AC
  Administered 2023-03-03: 700 mg via INTRAVENOUS
  Filled 2023-03-03: qty 14

## 2023-03-03 MED ORDER — SODIUM CHLORIDE 0.9 % IV SOLN: Freq: Once | INTRAVENOUS | Status: AC

## 2023-03-03 MED ORDER — OXALIPLATIN CHEMO INJECTION 50 MG/10ML
63.7500 mg/m2 | Freq: Once | INTRAVENOUS | Status: AC
  Administered 2023-03-03: 115 mg via INTRAVENOUS
  Filled 2023-03-03: qty 20

## 2023-03-03 MED ORDER — SODIUM CHLORIDE 0.9 % IV SOLN
5.0000 mg/kg | Freq: Once | INTRAVENOUS | Status: AC
  Administered 2023-03-03: 350 mg via INTRAVENOUS
  Filled 2023-03-03: qty 14

## 2023-03-03 MED ORDER — DEXTROSE 5 % IV SOLN: Freq: Once | INTRAVENOUS | Status: AC

## 2023-03-03 MED ORDER — SODIUM CHLORIDE 0.9 % IV SOLN
2400.0000 mg/m2 | INTRAVENOUS | Status: DC
  Administered 2023-03-03: 4350 mg via INTRAVENOUS
  Filled 2023-03-03: qty 87

## 2023-03-03 MED ORDER — SODIUM CHLORIDE 0.9 % IV SOLN
150.0000 mg | Freq: Once | INTRAVENOUS | Status: AC
  Administered 2023-03-03: 150 mg via INTRAVENOUS
  Filled 2023-03-03: qty 150

## 2023-03-03 NOTE — Patient Instructions (Signed)
Oxaliplatin Injection What is this medication? OXALIPLATIN (ox AL i PLA tin) treats colorectal cancer. It works by slowing down the growth of cancer cells. This medicine may be used for other purposes; ask your health care provider or pharmacist if you have questions. COMMON BRAND NAME(S): Eloxatin What should I tell my care team before I take this medication? They need to know if you have any of these conditions: Heart disease History of irregular heartbeat or rhythm Liver disease Low blood cell levels (white cells, red cells, and platelets) Lung or breathing disease, such as asthma Take medications that treat or prevent blood clots Tingling of the fingers, toes, or other nerve disorder An unusual or allergic reaction to oxaliplatin, other medications, foods, dyes, or preservatives If you or your partner are pregnant or trying to get pregnant Breast-feeding How should I use this medication? This medication is injected into a vein. It is given by your care team in a hospital or clinic setting. Talk to your care team about the use of this medication in children. Special care may be needed. Overdosage: If you think you have taken too much of this medicine contact a poison control center or emergency room at once. NOTE: This medicine is only for you. Do not share this medicine with others. What if I miss a dose? Keep appointments for follow-up doses. It is important not to miss a dose. Call your care team if you are unable to keep an appointment. What may interact with this medication? Do not take this medication with any of the following: Cisapride Dronedarone Pimozide Thioridazine This medication may also interact with the following: Aspirin and aspirin-like medications Certain medications that treat or prevent blood clots, such as warfarin, apixaban, dabigatran, and rivaroxaban Cisplatin Cyclosporine Diuretics Medications for infection, such as acyclovir, adefovir, amphotericin B,  bacitracin, cidofovir, foscarnet, ganciclovir, gentamicin, pentamidine, vancomycin NSAIDs, medications for pain and inflammation, such as ibuprofen or naproxen Other medications that cause heart rhythm changes Pamidronate Zoledronic acid This list may not describe all possible interactions. Give your health care provider a list of all the medicines, herbs, non-prescription drugs, or dietary supplements you use. Also tell them if you smoke, drink alcohol, or use illegal drugs. Some items may interact with your medicine. What should I watch for while using this medication? Your condition will be monitored carefully while you are receiving this medication. You may need blood work while taking this medication. This medication may make you feel generally unwell. This is not uncommon as chemotherapy can affect healthy cells as well as cancer cells. Report any side effects. Continue your course of treatment even though you feel ill unless your care team tells you to stop. This medication may increase your risk of getting an infection. Call your care team for advice if you get a fever, chills, sore throat, or other symptoms of a cold or flu. Do not treat yourself. Try to avoid being around people who are sick. Avoid taking medications that contain aspirin, acetaminophen, ibuprofen, naproxen, or ketoprofen unless instructed by your care team. These medications may hide a fever. Be careful brushing or flossing your teeth or using a toothpick because you may get an infection or bleed more easily. If you have any dental work done, tell your dentist you are receiving this medication. This medication can make you more sensitive to cold. Do not drink cold drinks or use ice. Cover exposed skin before coming in contact with cold temperatures or cold objects. When out in cold weather  wear warm clothing and cover your mouth and nose to warm the air that goes into your lungs. Tell your care team if you get sensitive to the  cold. Talk to your care team if you or your partner are pregnant or think either of you might be pregnant. This medication can cause serious birth defects if taken during pregnancy and for 9 months after the last dose. A negative pregnancy test is required before starting this medication. A reliable form of contraception is recommended while taking this medication and for 9 months after the last dose. Talk to your care team about effective forms of contraception. Do not father a child while taking this medication and for 6 months after the last dose. Use a condom while having sex during this time period. Do not breastfeed while taking this medication and for 3 months after the last dose. This medication may cause infertility. Talk to your care team if you are concerned about your fertility. What side effects may I notice from receiving this medication? Side effects that you should report to your care team as soon as possible: Allergic reactions--skin rash, itching, hives, swelling of the face, lips, tongue, or throat Bleeding--bloody or black, tar-like stools, vomiting blood or brown material that looks like coffee grounds, red or dark brown urine, small red or purple spots on skin, unusual bruising or bleeding Dry cough, shortness of breath or trouble breathing Heart rhythm changes--fast or irregular heartbeat, dizziness, feeling faint or lightheaded, chest pain, trouble breathing Infection--fever, chills, cough, sore throat, wounds that don't heal, pain or trouble when passing urine, general feeling of discomfort or being unwell Liver injury--right upper belly pain, loss of appetite, nausea, light-colored stool, dark yellow or brown urine, yellowing skin or eyes, unusual weakness or fatigue Low red blood cell level--unusual weakness or fatigue, dizziness, headache, trouble breathing Muscle injury--unusual weakness or fatigue, muscle pain, dark yellow or brown urine, decrease in amount of urine Pain,  tingling, or numbness in the hands or feet Sudden and severe headache, confusion, change in vision, seizures, which may be signs of posterior reversible encephalopathy syndrome (PRES) Unusual bruising or bleeding Side effects that usually do not require medical attention (report to your care team if they continue or are bothersome): Diarrhea Nausea Pain, redness, or swelling with sores inside the mouth or throat Unusual weakness or fatigue Vomiting This list may not describe all possible side effects. Call your doctor for medical advice about side effects. You may report side effects to FDA at 1-800-FDA-1088. Where should I keep my medication? This medication is given in a hospital or clinic. It will not be stored at home. NOTE: This sheet is a summary. It may not cover all possible information. If you have questions about this medicine, talk to your doctor, pharmacist, or health care provider.  2024 Elsevier/Gold Standard (2022-03-03 00:00:00)

## 2023-03-05 ENCOUNTER — Inpatient Hospital Stay: Payer: 59

## 2023-03-05 VITALS — BP 104/58 | HR 84 | Temp 98.0°F | Resp 16 | Ht 68.0 in | Wt 163.0 lb

## 2023-03-05 DIAGNOSIS — C7802 Secondary malignant neoplasm of left lung: Secondary | ICD-10-CM | POA: Diagnosis not present

## 2023-03-05 DIAGNOSIS — C7971 Secondary malignant neoplasm of right adrenal gland: Secondary | ICD-10-CM | POA: Diagnosis not present

## 2023-03-05 DIAGNOSIS — C182 Malignant neoplasm of ascending colon: Secondary | ICD-10-CM

## 2023-03-05 DIAGNOSIS — C7801 Secondary malignant neoplasm of right lung: Secondary | ICD-10-CM | POA: Diagnosis not present

## 2023-03-05 DIAGNOSIS — C7931 Secondary malignant neoplasm of brain: Secondary | ICD-10-CM | POA: Diagnosis not present

## 2023-03-05 DIAGNOSIS — C189 Malignant neoplasm of colon, unspecified: Secondary | ICD-10-CM | POA: Diagnosis not present

## 2023-03-05 DIAGNOSIS — C787 Secondary malignant neoplasm of liver and intrahepatic bile duct: Secondary | ICD-10-CM | POA: Diagnosis not present

## 2023-03-05 DIAGNOSIS — Z5111 Encounter for antineoplastic chemotherapy: Secondary | ICD-10-CM | POA: Diagnosis not present

## 2023-03-05 DIAGNOSIS — C7800 Secondary malignant neoplasm of unspecified lung: Secondary | ICD-10-CM

## 2023-03-05 DIAGNOSIS — C7989 Secondary malignant neoplasm of other specified sites: Secondary | ICD-10-CM | POA: Diagnosis not present

## 2023-03-05 DIAGNOSIS — Z5189 Encounter for other specified aftercare: Secondary | ICD-10-CM | POA: Diagnosis not present

## 2023-03-05 MED ORDER — SODIUM CHLORIDE 0.9 % IV SOLN: Freq: Once | INTRAVENOUS | Status: AC

## 2023-03-05 MED ORDER — HEPARIN SOD (PORK) LOCK FLUSH 100 UNIT/ML IV SOLN
500.0000 [IU] | Freq: Once | INTRAVENOUS | Status: AC | PRN
  Administered 2023-03-05: 500 [IU]

## 2023-03-05 MED ORDER — SODIUM CHLORIDE 0.9% FLUSH
10.0000 mL | INTRAVENOUS | Status: DC | PRN
  Administered 2023-03-05: 10 mL

## 2023-03-07 ENCOUNTER — Telehealth: Payer: Self-pay

## 2023-03-07 NOTE — Telephone Encounter (Signed)
Prior Authorization Vraylar 3 mg  #30/30 [facility]  Approved Effective:  03/01/23-02/29/24

## 2023-03-08 ENCOUNTER — Inpatient Hospital Stay: Payer: 59

## 2023-03-08 VITALS — BP 103/72 | HR 95 | Temp 98.2°F | Resp 16 | Wt 158.0 lb

## 2023-03-08 DIAGNOSIS — Z5111 Encounter for antineoplastic chemotherapy: Secondary | ICD-10-CM | POA: Diagnosis not present

## 2023-03-08 DIAGNOSIS — Z5189 Encounter for other specified aftercare: Secondary | ICD-10-CM | POA: Diagnosis not present

## 2023-03-08 DIAGNOSIS — C189 Malignant neoplasm of colon, unspecified: Secondary | ICD-10-CM

## 2023-03-08 DIAGNOSIS — C7800 Secondary malignant neoplasm of unspecified lung: Secondary | ICD-10-CM

## 2023-03-08 DIAGNOSIS — C182 Malignant neoplasm of ascending colon: Secondary | ICD-10-CM

## 2023-03-08 DIAGNOSIS — C7989 Secondary malignant neoplasm of other specified sites: Secondary | ICD-10-CM | POA: Diagnosis not present

## 2023-03-08 DIAGNOSIS — C787 Secondary malignant neoplasm of liver and intrahepatic bile duct: Secondary | ICD-10-CM | POA: Diagnosis not present

## 2023-03-08 DIAGNOSIS — C7931 Secondary malignant neoplasm of brain: Secondary | ICD-10-CM | POA: Diagnosis not present

## 2023-03-08 DIAGNOSIS — C7801 Secondary malignant neoplasm of right lung: Secondary | ICD-10-CM | POA: Diagnosis not present

## 2023-03-08 DIAGNOSIS — C7971 Secondary malignant neoplasm of right adrenal gland: Secondary | ICD-10-CM | POA: Diagnosis not present

## 2023-03-08 DIAGNOSIS — C7802 Secondary malignant neoplasm of left lung: Secondary | ICD-10-CM | POA: Diagnosis not present

## 2023-03-08 MED ORDER — FILGRASTIM-SNDZ 480 MCG/0.8ML IJ SOSY
480.0000 ug | PREFILLED_SYRINGE | Freq: Once | INTRAMUSCULAR | Status: AC
  Administered 2023-03-08: 480 ug via SUBCUTANEOUS
  Filled 2023-03-08: qty 0.8

## 2023-03-08 MED ORDER — SODIUM CHLORIDE 0.9 % IV SOLN: Freq: Once | INTRAVENOUS | Status: AC

## 2023-03-08 NOTE — Progress Notes (Signed)
Patient states that she feels "empty" states that she would like fluids if possible. Boneta Lucks Burn NP aware and order received.

## 2023-03-08 NOTE — Patient Instructions (Signed)
Filgrastim Injection What is this medication? FILGRASTIM (fil GRA stim) lowers the risk of infection in people who are receiving chemotherapy. It works by helping your body make more white blood cells, which protects your body from infection. It may also be used to help people who have been exposed to high doses of radiation. It can be used to help prepare your body before a stem cell transplant. It works by helping your bone marrow make and release stem cells into the blood. This medicine may be used for other purposes; ask your health care provider or pharmacist if you have questions. COMMON BRAND NAME(S): Neupogen, Nivestym, Releuko, Zarxio What should I tell my care team before I take this medication? They need to know if you have any of these conditions: History of blood diseases, such as sickle cell anemia Kidney disease Recent or ongoing radiation An unusual or allergic reaction to filgrastim, pegfilgrastim, latex, rubber, other medications, foods, dyes, or preservatives Pregnant or trying to get pregnant Breast-feeding How should I use this medication? This medication is injected under the skin or into a vein. It is usually given by your care team in a hospital or clinic setting. It may be given at home. If you get this medication at home, you will be taught how to prepare and give it. Use exactly as directed. Take it as directed on the prescription label at the same time every day. Keep taking it unless your care team tells you to stop. It is important that you put your used needles and syringes in a special sharps container. Do not put them in a trash can. If you do not have a sharps container, call your pharmacist or care team to get one. This medication comes with INSTRUCTIONS FOR USE. Ask your pharmacist for directions on how to use this medication. Read the information carefully. Talk to your pharmacist or care team if you have questions. Talk to your care team about the use of this  medication in children. While it may be prescribed for children for selected conditions, precautions do apply. Overdosage: If you think you have taken too much of this medicine contact a poison control center or emergency room at once. NOTE: This medicine is only for you. Do not share this medicine with others. What if I miss a dose? It is important not to miss any doses. Talk to your care team about what to do if you miss a dose. What may interact with this medication? Medications that may cause a release of neutrophils, such as lithium This list may not describe all possible interactions. Give your health care provider a list of all the medicines, herbs, non-prescription drugs, or dietary supplements you use. Also tell them if you smoke, drink alcohol, or use illegal drugs. Some items may interact with your medicine. What should I watch for while using this medication? Your condition will be monitored carefully while you are receiving this medication. You may need bloodwork while taking this medication. Talk to your care team about your risk of cancer. You may be more at risk for certain types of cancer if you take this medication. What side effects may I notice from receiving this medication? Side effects that you should report to your care team as soon as possible: Allergic reactions--skin rash, itching, hives, swelling of the face, lips, tongue, or throat Capillary leak syndrome--stomach or muscle pain, unusual weakness or fatigue, feeling faint or lightheaded, decrease in the amount of urine, swelling of the ankles, hands, or   feet, trouble breathing High white blood cell level--fever, fatigue, trouble breathing, night sweats, change in vision, weight loss Inflammation of the aorta--fever, fatigue, back, chest, or stomach pain, severe headache Kidney injury (glomerulonephritis)--decrease in the amount of urine, red or dark brown urine, foamy or bubbly urine, swelling of the ankles, hands, or  feet Shortness of breath or trouble breathing Spleen injury--pain in upper left stomach or shoulder Unusual bruising or bleeding Side effects that usually do not require medical attention (report to your care team if they continue or are bothersome): Back pain Bone pain Fatigue Fever Headache Nausea This list may not describe all possible side effects. Call your doctor for medical advice about side effects. You may report side effects to FDA at 1-800-FDA-1088. Where should I keep my medication? Keep out of the reach of children and pets. Keep this medication in the original packaging until you are ready to take it. Protect from light. See product for storage information. Each product may have different instructions. Get rid of any unused medication after the expiration date. To get rid of medications that are no longer needed or have expired: Take the medication to a medications take-back program. Check with your pharmacy or law enforcement to find a location. If you cannot return the medication, ask your pharmacist or care team how to get rid of this medication safely. NOTE: This sheet is a summary. It may not cover all possible information. If you have questions about this medicine, talk to your doctor, pharmacist, or health care provider.  2024 Elsevier/Gold Standard (2021-10-09 00:00:00)  

## 2023-03-09 ENCOUNTER — Inpatient Hospital Stay: Payer: 59

## 2023-03-09 VITALS — BP 104/69 | HR 96 | Temp 97.7°F | Resp 20 | Ht 68.0 in | Wt 157.1 lb

## 2023-03-09 DIAGNOSIS — Z5189 Encounter for other specified aftercare: Secondary | ICD-10-CM | POA: Diagnosis not present

## 2023-03-09 DIAGNOSIS — C189 Malignant neoplasm of colon, unspecified: Secondary | ICD-10-CM | POA: Diagnosis not present

## 2023-03-09 DIAGNOSIS — C7989 Secondary malignant neoplasm of other specified sites: Secondary | ICD-10-CM | POA: Diagnosis not present

## 2023-03-09 DIAGNOSIS — Z5111 Encounter for antineoplastic chemotherapy: Secondary | ICD-10-CM | POA: Diagnosis not present

## 2023-03-09 DIAGNOSIS — C7931 Secondary malignant neoplasm of brain: Secondary | ICD-10-CM | POA: Diagnosis not present

## 2023-03-09 DIAGNOSIS — C7801 Secondary malignant neoplasm of right lung: Secondary | ICD-10-CM | POA: Diagnosis not present

## 2023-03-09 DIAGNOSIS — C7802 Secondary malignant neoplasm of left lung: Secondary | ICD-10-CM | POA: Diagnosis not present

## 2023-03-09 DIAGNOSIS — C7971 Secondary malignant neoplasm of right adrenal gland: Secondary | ICD-10-CM | POA: Diagnosis not present

## 2023-03-09 DIAGNOSIS — C182 Malignant neoplasm of ascending colon: Secondary | ICD-10-CM

## 2023-03-09 DIAGNOSIS — C787 Secondary malignant neoplasm of liver and intrahepatic bile duct: Secondary | ICD-10-CM | POA: Diagnosis not present

## 2023-03-09 MED ORDER — FILGRASTIM-SNDZ 480 MCG/0.8ML IJ SOSY
480.0000 ug | PREFILLED_SYRINGE | Freq: Once | INTRAMUSCULAR | Status: AC
  Administered 2023-03-09: 480 ug via SUBCUTANEOUS
  Filled 2023-03-09: qty 0.8

## 2023-03-09 NOTE — Patient Instructions (Signed)
Filgrastim Injection What is this medication? FILGRASTIM (fil GRA stim) lowers the risk of infection in people who are receiving chemotherapy. It works by helping your body make more white blood cells, which protects your body from infection. It may also be used to help people who have been exposed to high doses of radiation. It can be used to help prepare your body before a stem cell transplant. It works by helping your bone marrow make and release stem cells into the blood. This medicine may be used for other purposes; ask your health care provider or pharmacist if you have questions. COMMON BRAND NAME(S): Neupogen, Nivestym, Releuko, Zarxio What should I tell my care team before I take this medication? They need to know if you have any of these conditions: History of blood diseases, such as sickle cell anemia Kidney disease Recent or ongoing radiation An unusual or allergic reaction to filgrastim, pegfilgrastim, latex, rubber, other medications, foods, dyes, or preservatives Pregnant or trying to get pregnant Breast-feeding How should I use this medication? This medication is injected under the skin or into a vein. It is usually given by your care team in a hospital or clinic setting. It may be given at home. If you get this medication at home, you will be taught how to prepare and give it. Use exactly as directed. Take it as directed on the prescription label at the same time every day. Keep taking it unless your care team tells you to stop. It is important that you put your used needles and syringes in a special sharps container. Do not put them in a trash can. If you do not have a sharps container, call your pharmacist or care team to get one. This medication comes with INSTRUCTIONS FOR USE. Ask your pharmacist for directions on how to use this medication. Read the information carefully. Talk to your pharmacist or care team if you have questions. Talk to your care team about the use of this  medication in children. While it may be prescribed for children for selected conditions, precautions do apply. Overdosage: If you think you have taken too much of this medicine contact a poison control center or emergency room at once. NOTE: This medicine is only for you. Do not share this medicine with others. What if I miss a dose? It is important not to miss any doses. Talk to your care team about what to do if you miss a dose. What may interact with this medication? Medications that may cause a release of neutrophils, such as lithium This list may not describe all possible interactions. Give your health care provider a list of all the medicines, herbs, non-prescription drugs, or dietary supplements you use. Also tell them if you smoke, drink alcohol, or use illegal drugs. Some items may interact with your medicine. What should I watch for while using this medication? Your condition will be monitored carefully while you are receiving this medication. You may need bloodwork while taking this medication. Talk to your care team about your risk of cancer. You may be more at risk for certain types of cancer if you take this medication. What side effects may I notice from receiving this medication? Side effects that you should report to your care team as soon as possible: Allergic reactions--skin rash, itching, hives, swelling of the face, lips, tongue, or throat Capillary leak syndrome--stomach or muscle pain, unusual weakness or fatigue, feeling faint or lightheaded, decrease in the amount of urine, swelling of the ankles, hands, or   feet, trouble breathing High white blood cell level--fever, fatigue, trouble breathing, night sweats, change in vision, weight loss Inflammation of the aorta--fever, fatigue, back, chest, or stomach pain, severe headache Kidney injury (glomerulonephritis)--decrease in the amount of urine, red or dark brown urine, foamy or bubbly urine, swelling of the ankles, hands, or  feet Shortness of breath or trouble breathing Spleen injury--pain in upper left stomach or shoulder Unusual bruising or bleeding Side effects that usually do not require medical attention (report to your care team if they continue or are bothersome): Back pain Bone pain Fatigue Fever Headache Nausea This list may not describe all possible side effects. Call your doctor for medical advice about side effects. You may report side effects to FDA at 1-800-FDA-1088. Where should I keep my medication? Keep out of the reach of children and pets. Keep this medication in the original packaging until you are ready to take it. Protect from light. See product for storage information. Each product may have different instructions. Get rid of any unused medication after the expiration date. To get rid of medications that are no longer needed or have expired: Take the medication to a medications take-back program. Check with your pharmacy or law enforcement to find a location. If you cannot return the medication, ask your pharmacist or care team how to get rid of this medication safely. NOTE: This sheet is a summary. It may not cover all possible information. If you have questions about this medicine, talk to your doctor, pharmacist, or health care provider.  2024 Elsevier/Gold Standard (2021-10-09 00:00:00)  

## 2023-03-10 ENCOUNTER — Inpatient Hospital Stay: Payer: 59

## 2023-03-10 ENCOUNTER — Encounter: Payer: Self-pay | Admitting: Oncology

## 2023-03-10 VITALS — BP 123/73 | HR 87 | Resp 20 | Ht 68.0 in | Wt 158.1 lb

## 2023-03-10 DIAGNOSIS — E860 Dehydration: Secondary | ICD-10-CM

## 2023-03-10 DIAGNOSIS — C7931 Secondary malignant neoplasm of brain: Secondary | ICD-10-CM | POA: Diagnosis not present

## 2023-03-10 DIAGNOSIS — Z5189 Encounter for other specified aftercare: Secondary | ICD-10-CM | POA: Diagnosis not present

## 2023-03-10 DIAGNOSIS — C787 Secondary malignant neoplasm of liver and intrahepatic bile duct: Secondary | ICD-10-CM | POA: Diagnosis not present

## 2023-03-10 DIAGNOSIS — C7971 Secondary malignant neoplasm of right adrenal gland: Secondary | ICD-10-CM | POA: Diagnosis not present

## 2023-03-10 DIAGNOSIS — C7989 Secondary malignant neoplasm of other specified sites: Secondary | ICD-10-CM | POA: Diagnosis not present

## 2023-03-10 DIAGNOSIS — C182 Malignant neoplasm of ascending colon: Secondary | ICD-10-CM

## 2023-03-10 DIAGNOSIS — C7800 Secondary malignant neoplasm of unspecified lung: Secondary | ICD-10-CM

## 2023-03-10 DIAGNOSIS — C189 Malignant neoplasm of colon, unspecified: Secondary | ICD-10-CM

## 2023-03-10 DIAGNOSIS — C7801 Secondary malignant neoplasm of right lung: Secondary | ICD-10-CM | POA: Diagnosis not present

## 2023-03-10 DIAGNOSIS — Z5111 Encounter for antineoplastic chemotherapy: Secondary | ICD-10-CM | POA: Diagnosis not present

## 2023-03-10 DIAGNOSIS — C7802 Secondary malignant neoplasm of left lung: Secondary | ICD-10-CM | POA: Diagnosis not present

## 2023-03-10 MED ORDER — FILGRASTIM-SNDZ 480 MCG/0.8ML IJ SOSY
480.0000 ug | PREFILLED_SYRINGE | Freq: Once | INTRAMUSCULAR | Status: AC
  Administered 2023-03-10: 480 ug via SUBCUTANEOUS

## 2023-03-10 MED ORDER — HEPARIN SOD (PORK) LOCK FLUSH 100 UNIT/ML IV SOLN
500.0000 [IU] | Freq: Once | INTRAVENOUS | Status: AC | PRN
  Administered 2023-03-10: 500 [IU]

## 2023-03-10 MED ORDER — SODIUM CHLORIDE 0.9% FLUSH
10.0000 mL | Freq: Once | INTRAVENOUS | Status: AC | PRN
  Administered 2023-03-10: 10 mL

## 2023-03-10 MED ORDER — SODIUM CHLORIDE 0.9 % IV SOLN: Freq: Once | INTRAVENOUS | Status: AC

## 2023-03-10 NOTE — Patient Instructions (Signed)
Denosumab Injection (Oncology) What is this medication? DENOSUMAB (den oh SUE mab) prevents weakened bones caused by cancer. It may also be used to treat noncancerous bone tumors that cannot be removed by surgery. It can also be used to treat high calcium levels in the blood caused by cancer. It works by blocking a protein that causes bones to break down quickly. This slows down the release of calcium from bones, which lowers calcium levels in your blood. It also makes your bones stronger and less likely to break (fracture). This medicine may be used for other purposes; ask your health care provider or pharmacist if you have questions. COMMON BRAND NAME(S): XGEVA What should I tell my care team before I take this medication? They need to know if you have any of these conditions: Dental disease Having surgery or tooth extraction Infection Kidney disease Low levels of calcium or vitamin D in the blood Malnutrition On hemodialysis Skin conditions or sensitivity Thyroid or parathyroid disease An unusual reaction to denosumab, other medications, foods, dyes, or preservatives Pregnant or trying to get pregnant Breast-feeding How should I use this medication? This medication is for injection under the skin. It is given by your care team in a hospital or clinic setting. A special MedGuide will be given to you before each treatment. Be sure to read this information carefully each time. Talk to your care team about the use of this medication in children. While it may be prescribed for children as young as 13 years for selected conditions, precautions do apply. Overdosage: If you think you have taken too much of this medicine contact a poison control center or emergency room at once. NOTE: This medicine is only for you. Do not share this medicine with others. What if I miss a dose? Keep appointments for follow-up doses. It is important not to miss your dose. Call your care team if you are unable to  keep an appointment. What may interact with this medication? Do not take this medication with any of the following: Other medications containing denosumab This medication may also interact with the following: Medications that lower your chance of fighting infection Steroid medications, such as prednisone or cortisone This list may not describe all possible interactions. Give your health care provider a list of all the medicines, herbs, non-prescription drugs, or dietary supplements you use. Also tell them if you smoke, drink alcohol, or use illegal drugs. Some items may interact with your medicine. What should I watch for while using this medication? Your condition will be monitored carefully while you are receiving this medication. You may need blood work while taking this medication. This medication may increase your risk of getting an infection. Call your care team for advice if you get a fever, chills, sore throat, or other symptoms of a cold or flu. Do not treat yourself. Try to avoid being around people who are sick. You should make sure you get enough calcium and vitamin D while you are taking this medication, unless your care team tells you not to. Discuss the foods you eat and the vitamins you take with your care team. Some people who take this medication have severe bone, joint, or muscle pain. This medication may also increase your risk for jaw problems or a broken thigh bone. Tell your care team right away if you have severe pain in your jaw, bones, joints, or muscles. Tell your care team if you have any pain that does not go away or that gets worse. Talk  to your care team if you may be pregnant. Serious birth defects can occur if you take this medication during pregnancy and for 5 months after the last dose. You will need a negative pregnancy test before starting this medication. Contraception is recommended while taking this medication and for 5 months after the last dose. Your care team  can help you find the option that works for you. What side effects may I notice from receiving this medication? Side effects that you should report to your care team as soon as possible: Allergic reactions--skin rash, itching, hives, swelling of the face, lips, tongue, or throat Bone, joint, or muscle pain Low calcium level--muscle pain or cramps, confusion, tingling, or numbness in the hands or feet Osteonecrosis of the jaw--pain, swelling, or redness in the mouth, numbness of the jaw, poor healing after dental work, unusual discharge from the mouth, visible bones in the mouth Side effects that usually do not require medical attention (report to your care team if they continue or are bothersome): Cough Diarrhea Fatigue Headache Nausea This list may not describe all possible side effects. Call your doctor for medical advice about side effects. You may report side effects to FDA at 1-800-FDA-1088. Where should I keep my medication? This medication is given in a hospital or clinic. It will not be stored at home. NOTE: This sheet is a summary. It may not cover all possible information. If you have questions about this medicine, talk to your doctor, pharmacist, or health care provider.  2024 Elsevier/Gold Standard (2021-10-08 00:00:00) IV Infusion Therapy IV infusion therapy is a type of treatment to deliver a liquid substance (infusion) directly into a vein through a small, thin tube (catheter). You may have IV infusion therapy to receive an infusion of: Fluids. Medicines. Nutrition. Chemotherapy. This is the use of medicines to stop or slow the growth of cancer cells. Blood or blood products. X-ray dye that is given before an imaging procedure, such as an MRI or a CT scan. Tell a health care provider about: Any allergies you have. All medicines you are taking, including vitamins, herbs, eye drops, creams, and over-the-counter medicines. Any problems you or family members have had with  anesthetic medicines or X-ray dyes. Any blood disorders you have. Any surgeries you have had, including an axillary lymph node dissection and an arteriovenous fistula for dialysis. Any medical conditions you have. Whether you are pregnant or may be pregnant. Any history of IV drug use. Any history of health care providers not being able to find a vein for an IV or blood draw. What are the risks? Generally, this is a safe procedure. However, problems may occur, including: Pain or bruising. Bleeding. Infection. Failure to place the catheter due to inability to find a vein. Leaking or blockage of the catheter (infiltration). Damage to blood vessels or nerves. Allergic reactions to medicines or dyes. A blood clot. What happens before the procedure? Follow instructions from your health care provider about eating or drinking restrictions. Ask your health care provider about changing or stopping your regular medicines. This is especially important if you are taking diabetes medicines or blood thinners. Learn as much as you can about your treatment. Ask your health care provider for reliable resources, such as websites, books, videos, and people, to help you learn about the treatment you will be having. What happens during the procedure?     Placing the catheter IV infusion therapy starts with a procedure to place a catheter into a vein. An IV  tube will be attached to the catheter to allow the infusion to flow into your bloodstream. Your catheter may be placed: Into a vein that is usually in the bend of the elbow, in the forearm, or in the back of the hand (peripheral IV catheter). This type of catheter may need to be inserted into a vein each time you get an infusion. Into a vein near your elbow (midline catheter or PICC). This type of catheter may stay in place for weeks or months at a time so you can receive repeated infusions through it. Into a vein near your neck that leads to your heart  (non-tunneled catheter). This type of catheter is only used for short amounts of time because it can cause infection. Through the skin of your chest and into a large vein that leads to your heart (tunneled catheter). This type of catheter may stay in your body for months or years. So that it connects to an implanted port. An implanted port is a device that is surgically inserted under the skin of the chest to provide long-term IV access. The catheter will connect the port to a large vein in the chest or upper arm. A port may be kept in place for many months or years. Each time you have an infusion, a needle will be inserted through your skin to connect the catheter to the port. After your catheter is placed, your health care team will lower your risk of infection by washing the skin at the IV site with a germ-killing (antiseptic) solution. Doing the infusion To start the infusion, your health care provider will: Attach the IV tubing to your catheter. Use a tape or an adhesive bandage (dressing) to hold the catheter and tubing in place against your skin. Use an IV pump to control the flow of the IV infusion, if needed. During the infusion, your health care provider will check the area to make sure: There is no swelling or pain. Your IV infusion is flowing through the catheter properly. After the infusion, your health care provider will: Remove the dressing or tape. Disconnect the tubing from the catheter. Remove the catheter, if you have a peripheral IV. Apply pressure over the IV insertion site to stop bleeding, then cover the area with a dressing. If you have an implanted port, PICC, non-tunneled, or tunneled catheter, your health care team may leave the catheter in place. This depends on your treatment, your medical condition, and what type of catheter you have. The procedure may vary among health care providers and hospitals. What can I expect after the procedure? Your blood pressure, heart  rate, breathing rate, and blood oxygen level may be monitored until you leave the hospital or clinic. Follow these instructions at home: Take over-the-counter and prescription medicines only as told by your health care provider. Change or remove any dressings only as told by your health care provider. Return to your normal activities as told by your health care provider. Ask your health care provider what activities are safe for you. Do not take baths, swim, or use a hot tub until your health care provider approves. Ask your health care provider if you may take showers. Check your IV insertion site every day for signs of infection. Check for: Redness, swelling, or pain. Fluid or blood. If fluid or blood drains from your IV site, use your hands to press down firmly on the area for a minute or two. Doing this should stop the bleeding. Warmth. Pus or a  bad smell. Keep all follow-up visits. This is important. Contact a health care provider if: You have signs of infection around your IV site. You have a fever or chills. You have fluid or blood coming from your IV site that does not stop after you apply pressure to the site. You have itchy skin. You have a rash or blisters. You have itchy, red, swollen areas of skin (hives). Get help right away if: You have trouble breathing. You have chest pain. These symptoms may represent a serious problem that is an emergency. Do not wait to see if the symptoms will go away. Get medical help right away. Call your local emergency services (911 in the [place] Do not drive yourself to the hospital. Summary IV infusion therapy is a type of treatment to deliver a liquid substance (infusion) directly into a vein. Check your IV insertion site every day for signs of infection, such as redness or swelling. Change or remove bandages (dressings) only as told by your health care provider. Call your health care provider if you notice any signs of infection around your IV  site or have a rash or blisters. This information is not intended to replace advice given to you by your health care provider. Make sure you discuss any questions you have with your health care provider. Document Revised: 12/29/2019 Document Reviewed: 12/30/2019 Elsevier Patient Education  2024 [employer].

## 2023-03-15 ENCOUNTER — Inpatient Hospital Stay: Payer: 59

## 2023-03-15 ENCOUNTER — Inpatient Hospital Stay (HOSPITAL_BASED_OUTPATIENT_CLINIC_OR_DEPARTMENT_OTHER): Payer: 59 | Admitting: Oncology

## 2023-03-15 ENCOUNTER — Encounter: Payer: Self-pay | Admitting: Oncology

## 2023-03-15 DIAGNOSIS — C189 Malignant neoplasm of colon, unspecified: Secondary | ICD-10-CM | POA: Diagnosis not present

## 2023-03-15 DIAGNOSIS — C7931 Secondary malignant neoplasm of brain: Secondary | ICD-10-CM | POA: Diagnosis not present

## 2023-03-15 DIAGNOSIS — C182 Malignant neoplasm of ascending colon: Secondary | ICD-10-CM

## 2023-03-15 DIAGNOSIS — C7800 Secondary malignant neoplasm of unspecified lung: Secondary | ICD-10-CM

## 2023-03-15 DIAGNOSIS — C7971 Secondary malignant neoplasm of right adrenal gland: Secondary | ICD-10-CM | POA: Diagnosis not present

## 2023-03-15 DIAGNOSIS — C787 Secondary malignant neoplasm of liver and intrahepatic bile duct: Secondary | ICD-10-CM

## 2023-03-15 DIAGNOSIS — C7802 Secondary malignant neoplasm of left lung: Secondary | ICD-10-CM | POA: Diagnosis not present

## 2023-03-15 DIAGNOSIS — C7989 Secondary malignant neoplasm of other specified sites: Secondary | ICD-10-CM | POA: Diagnosis not present

## 2023-03-15 DIAGNOSIS — Z5111 Encounter for antineoplastic chemotherapy: Secondary | ICD-10-CM | POA: Diagnosis not present

## 2023-03-15 DIAGNOSIS — C7801 Secondary malignant neoplasm of right lung: Secondary | ICD-10-CM | POA: Diagnosis not present

## 2023-03-15 DIAGNOSIS — Z5189 Encounter for other specified aftercare: Secondary | ICD-10-CM | POA: Diagnosis not present

## 2023-03-15 LAB — BASIC METABOLIC PANEL
BUN: 16 (ref 4–21)
CO2: 37 — AB (ref 13–22)
Chloride: 101 (ref 99–108)
Creatinine: 1.4 — AB (ref 0.5–1.1)
Glucose: 69
Potassium: 4.1 meq/L (ref 3.5–5.1)
Sodium: 138 (ref 137–147)

## 2023-03-15 LAB — CBC AND DIFFERENTIAL
HCT: 38 (ref 36–46)
Hemoglobin: 12.7 (ref 12.0–16.0)
Neutrophils Absolute: 1.15
Platelets: 48 10*3/uL — AB (ref 150–400)
WBC: 2.5

## 2023-03-15 LAB — COMPREHENSIVE METABOLIC PANEL
Albumin: 3.9 (ref 3.5–5.0)
Calcium: 9.8 (ref 8.7–10.7)
EGFR: 39

## 2023-03-15 LAB — HEPATIC FUNCTION PANEL
ALT: 29 U/L (ref 7–35)
AST: 41 — AB (ref 13–35)
Alkaline Phosphatase: 136 — AB (ref 25–125)
Bilirubin, Total: 0.3

## 2023-03-15 LAB — CBC: RBC: 3.82 — AB (ref 3.87–5.11)

## 2023-03-15 LAB — TOTAL PROTEIN, URINE DIPSTICK: Protein, ur: NEGATIVE mg/dL

## 2023-03-15 MED FILL — Fosaprepitant Dimeglumine For IV Infusion 150 MG (Base Eq): INTRAVENOUS | Qty: 5 | Status: AC

## 2023-03-15 MED FILL — Fluorouracil IV Soln 2.5 GM/50ML (50 MG/ML): INTRAVENOUS | Qty: 14 | Status: AC

## 2023-03-15 MED FILL — Fluorouracil IV Soln 5 GM/100ML (50 MG/ML): INTRAVENOUS | Qty: 87 | Status: AC

## 2023-03-15 MED FILL — Dexamethasone Sodium Phosphate Inj 100 MG/10ML: INTRAMUSCULAR | Qty: 1 | Status: AC

## 2023-03-15 NOTE — Progress Notes (Unsigned)
[facility]  [address] [place],  [place]  [address] [phone]  Clinic Day:  03/01/2023  Referring physician: Alinda Deem, MD  HISTORY OF PRESENT ILLNESS:  The patient is a 57 y.o. female with  metastatic colon cancer, which includes spread of disease to her abdominal cavity, lungs, brain and right adrenal gland.  She comes in today to  be evaluated before heading into her 14th cycle of  FOLFOX/Avastin.  The patient claims to have tolerated her 13th cycle of FOLFOX/Avastin fairly well.  She has started to notice numbness on her fingertips, but this is not affected her fine motor skills with her fingers.  Of note, her oxaliplatin dose was reduced with her 12th cycle of chemotherapy.  She believes she did have less diarrhea over these past few weeks.  She continues to deny having any new symptoms or findings which concern her for overt signs of disease progression.  With respect to her colon cancer history, she is status post a right hemicolectomy in early October 2018, followed by 12 cycles of adjuvant FOLFOX chemotherapy, which were completed in April 2019.  In December 2021, she underwent a left cerebellar metastasectomy, whose pathology was consistent with metastatic colon cancer.  Tumor testing did come back MMR normal. CT scans revealed evidence of her cancer being in multiple locations, for which she took 40 cycles of FOLFIRI/Avastin.  As follow-up scans revealed liver metastasis, she was placed back on FOLFOX/Avastin, for which she continues to take.  PHYSICAL EXAM:  Last menstrual period 02/22/2019. There is no height or weight on file to calculate BMI.  Performance status (ECOG): 1 - Symptomatic but completely ambulatory  Physical Exam Vitals and nursing note reviewed.  Constitutional:      General: She is not in acute distress.    Appearance: Normal appearance.  HENT:     Head: Normocephalic and atraumatic.     Mouth/Throat:      Mouth: Mucous membranes are moist.     Pharynx: Oropharynx is clear. No oropharyngeal exudate or posterior oropharyngeal erythema.  Eyes:     General: No scleral icterus.    Extraocular Movements: Extraocular movements intact.     Conjunctiva/sclera: Conjunctivae normal.     Pupils: Pupils are equal, round, and reactive to light.  Cardiovascular:     Rate and Rhythm: Normal rate and regular rhythm.     Heart sounds: Normal heart sounds. No murmur heard.    No friction rub. No gallop.  Pulmonary:     Effort: Pulmonary effort is normal.     Breath sounds: Normal breath sounds. No wheezing, rhonchi or rales.  Abdominal:     General: There is no distension.     Palpations: Abdomen is soft. There is no hepatomegaly, splenomegaly or mass.     Tenderness: There is no abdominal tenderness.  Musculoskeletal:        General: Normal range of motion.     Cervical back: Normal range of motion and neck supple. No tenderness.     Right lower leg: No edema.     Left lower leg: No edema.  Lymphadenopathy:     Cervical: No cervical adenopathy.     Upper Body:     Right upper body: No supraclavicular or axillary adenopathy.     Left upper body: No supraclavicular or axillary adenopathy.     Lower Body: No right inguinal adenopathy. No left inguinal adenopathy.  Skin:    General: Skin is warm  and dry.     Coloration: Skin is not jaundiced.     Findings: No rash.  Neurological:     Mental Status: She is alert and oriented to person, place, and time.     Cranial Nerves: No cranial nerve deficit.  Psychiatric:        Mood and Affect: Mood normal.        Behavior: Behavior normal.        Thought Content: Thought content normal.  SCANS: CT scans of her chest/abdomen/pelvis revealed the following: FINDINGS: CT CHEST FINDINGS  Cardiovascular: Right IJ Port-A-Cath terminates at the SVC RA junction. Heart is at the upper limits of normal in size to mildly enlarged. No pericardial  effusion.  Mediastinum/Nodes: No pathologically enlarged mediastinal, hilar or axillary lymph nodes. Esophagus is grossly unremarkable.  Lungs/Pleura: Spiculated areas of nodular consolidation in the lungs bilaterally are unchanged from 12/18/2022. Index lesion in the posterior segment right upper lobe measures 1.4 x 2.7 cm (301/42). No new nodules. No pleural fluid. Airway is unremarkable.  Musculoskeletal: Degenerative changes in the spine. No worrisome lytic or sclerotic lesions.  CT ABDOMEN PELVIS FINDINGS  Hepatobiliary: Liver is decreased in attenuation diffusely. Subtle hypoattenuating lesions in the caudate and right hepatic lobe measures 5 mm (2/54) and 9 mm (2/61), stable from 12/18/2022. Focal low attenuation along the falciform ligament (2/55), likely focal fat. Liver and gallbladder are otherwise unremarkable. No biliary ductal dilatation.  Pancreas: Negative.  Spleen: Negative.  Adrenals/Urinary Tract: Adrenal glands and kidneys are unremarkable. Ureters are decompressed. Bladder is low in volume.  Stomach/Bowel: Stomach and small bowel are unremarkable. Right hemicolectomy. Colon is otherwise unremarkable.  Vascular/Lymphatic: Atherosclerotic calcification of the aorta. No pathologically enlarged lymph nodes.  Reproductive: Hysterectomy. No adnexal mass.  Other: Mild laxity of the midline ventral abdominal wall, deep to an incisional scar. No free fluid. Mesenteries and peritoneum are unremarkable.  Musculoskeletal: Degenerative changes in the spine. No worrisome lytic or sclerotic lesions.  IMPRESSION: 1. Stable pulmonary and hepatic metastatic disease. No evidence of disease progression. 2. Hepatic steatosis. 3. Aortic atherosclerosis (ICD10-I70.0).  LABS:       Latest Ref Rng & Units 03/01/2023    8:50 AM 02/15/2023   12:00 AM 01/29/2023   12:00 AM  CBC  WBC  3.4     2.9     6.3      Hemoglobin 12.0 - 16.0 11.8     11.7     10.9       Hematocrit 36 - 46 35     35     32      Platelets 150 - 400 K/uL 52     57     68         This result is from an external source.      Latest Ref Rng & Units 03/01/2023    8:50 AM 02/15/2023    2:03 PM 01/29/2023    1:37 PM  CMP  Glucose 70 - 99 mg/dL  865  784   BUN 4 - 21 16     18  23    Creatinine 0.5 - 1.1 1.4     1.39  1.29   Sodium 137 - 147 139     137  135   Potassium 3.5 - 5.1 mEq/L 3.8     4.0  3.8   Chloride 99 - 108 108     104  104   CO2 13 - 22 25  24  23   Calcium 8.7 - 10.7 9.2     8.7  8.6   Total Protein 6.5 - 8.1 g/dL  6.3  6.5   Total Bilirubin 0.3 - 1.2 mg/dL  0.5  0.6   Alkaline Phos 25 - 125 119     117  147   AST 13 - 35 31     26  20    ALT 7 - 35 U/L 28     19  18       This result is from an external source.   ASSESSMENT & PLAN:  Assessment/Plan:  A 57 y.o. female with metastatic colon cancer.  In clinic today, I went over all of her CT scan images with her, for which she could  see that her disease is stable.  From my vantage point, I actually believe her lung and liver lesions are less conspicuous now than what they were previously.  She definitely could see that there is no evidence of disease progression.  Based upon this, she will proceed with her 13th cycle of FOLFOX/Avastin this week.  As she is moderately neutropenic, 3 doses of Christeen Douglas will be given to bolster her white count.  Although her platelets are low, she will still proceed with her 13th cycle of chemotherapy this week.  However, if they continue to fall, future cycles of chemotherapy may need to be spaced out every 3 weeks to give her bone marrow an extra week to recuperate from each successive cycle of chemotherapy.  Otherwise, the patient is clinically doing very well.  I will see her back in 2 weeks before she heads into her 14th cycle of FOLFOX/Avastin.  The patient understands all the plans discussed today and is in agreement with them.  Nemesio Castrillon Kirby Funk, MD

## 2023-03-16 ENCOUNTER — Encounter: Payer: Self-pay | Admitting: Hematology and Oncology

## 2023-03-16 ENCOUNTER — Encounter: Payer: Self-pay | Admitting: Oncology

## 2023-03-16 DIAGNOSIS — F339 Major depressive disorder, recurrent, unspecified: Secondary | ICD-10-CM | POA: Diagnosis not present

## 2023-03-16 DIAGNOSIS — K219 Gastro-esophageal reflux disease without esophagitis: Secondary | ICD-10-CM | POA: Diagnosis not present

## 2023-03-16 DIAGNOSIS — E7800 Pure hypercholesterolemia, unspecified: Secondary | ICD-10-CM | POA: Diagnosis not present

## 2023-03-16 DIAGNOSIS — E1169 Type 2 diabetes mellitus with other specified complication: Secondary | ICD-10-CM | POA: Diagnosis not present

## 2023-03-16 DIAGNOSIS — E039 Hypothyroidism, unspecified: Secondary | ICD-10-CM | POA: Diagnosis not present

## 2023-03-16 DIAGNOSIS — Z6825 Body mass index (BMI) 25.0-25.9, adult: Secondary | ICD-10-CM | POA: Diagnosis not present

## 2023-03-16 MED FILL — Oxaliplatin IV Soln 50 MG/10ML: INTRAVENOUS | Qty: 23 | Status: AC

## 2023-03-17 ENCOUNTER — Inpatient Hospital Stay: Payer: 59

## 2023-03-17 VITALS — BP 136/76 | HR 89 | Temp 98.7°F | Resp 18 | Ht 68.0 in | Wt 158.0 lb

## 2023-03-17 DIAGNOSIS — C7989 Secondary malignant neoplasm of other specified sites: Secondary | ICD-10-CM | POA: Diagnosis not present

## 2023-03-17 DIAGNOSIS — C787 Secondary malignant neoplasm of liver and intrahepatic bile duct: Secondary | ICD-10-CM | POA: Diagnosis not present

## 2023-03-17 DIAGNOSIS — C7801 Secondary malignant neoplasm of right lung: Secondary | ICD-10-CM | POA: Diagnosis not present

## 2023-03-17 DIAGNOSIS — Z5111 Encounter for antineoplastic chemotherapy: Secondary | ICD-10-CM | POA: Diagnosis not present

## 2023-03-17 DIAGNOSIS — C182 Malignant neoplasm of ascending colon: Secondary | ICD-10-CM

## 2023-03-17 DIAGNOSIS — C189 Malignant neoplasm of colon, unspecified: Secondary | ICD-10-CM | POA: Diagnosis not present

## 2023-03-17 DIAGNOSIS — Z5189 Encounter for other specified aftercare: Secondary | ICD-10-CM | POA: Diagnosis not present

## 2023-03-17 DIAGNOSIS — C7802 Secondary malignant neoplasm of left lung: Secondary | ICD-10-CM | POA: Diagnosis not present

## 2023-03-17 DIAGNOSIS — C7931 Secondary malignant neoplasm of brain: Secondary | ICD-10-CM | POA: Diagnosis not present

## 2023-03-17 DIAGNOSIS — C7971 Secondary malignant neoplasm of right adrenal gland: Secondary | ICD-10-CM | POA: Diagnosis not present

## 2023-03-17 MED ORDER — FLUOROURACIL CHEMO INJECTION 2.5 GM/50ML
400.0000 mg/m2 | Freq: Once | INTRAVENOUS | Status: AC
  Administered 2023-03-17: 700 mg via INTRAVENOUS
  Filled 2023-03-17: qty 14

## 2023-03-17 MED ORDER — SODIUM CHLORIDE 0.9 % IV SOLN
150.0000 mg | Freq: Once | INTRAVENOUS | Status: AC
  Administered 2023-03-17: 150 mg via INTRAVENOUS
  Filled 2023-03-17: qty 150

## 2023-03-17 MED ORDER — FAMOTIDINE IN NACL 20-0.9 MG/50ML-% IV SOLN
20.0000 mg | Freq: Once | INTRAVENOUS | Status: AC
  Administered 2023-03-17: 20 mg via INTRAVENOUS
  Filled 2023-03-17: qty 50

## 2023-03-17 MED ORDER — SODIUM CHLORIDE 0.9 % IV SOLN
2400.0000 mg/m2 | INTRAVENOUS | Status: DC
  Administered 2023-03-17: 4350 mg via INTRAVENOUS
  Filled 2023-03-17: qty 87

## 2023-03-17 MED ORDER — SODIUM CHLORIDE 0.9 % IV SOLN: Freq: Once | INTRAVENOUS | Status: AC

## 2023-03-17 MED ORDER — DIPHENHYDRAMINE HCL 50 MG/ML IJ SOLN
25.0000 mg | Freq: Once | INTRAMUSCULAR | Status: AC
  Administered 2023-03-17: 25 mg via INTRAVENOUS
  Filled 2023-03-17: qty 1

## 2023-03-17 MED ORDER — DEXTROSE 5 % IV SOLN: Freq: Once | INTRAVENOUS | Status: AC

## 2023-03-17 MED ORDER — OXALIPLATIN CHEMO INJECTION 50 MG/10ML
63.7500 mg/m2 | Freq: Once | INTRAVENOUS | Status: AC
  Administered 2023-03-17: 115 mg via INTRAVENOUS
  Filled 2023-03-17: qty 20

## 2023-03-17 MED ORDER — PALONOSETRON HCL INJECTION 0.25 MG/5ML
0.2500 mg | Freq: Once | INTRAVENOUS | Status: AC
  Administered 2023-03-17: 0.25 mg via INTRAVENOUS
  Filled 2023-03-17: qty 5

## 2023-03-17 MED ORDER — SODIUM CHLORIDE 0.9 % IV SOLN
10.0000 mg | Freq: Once | INTRAVENOUS | Status: AC
  Administered 2023-03-17: 10 mg via INTRAVENOUS
  Filled 2023-03-17: qty 10

## 2023-03-17 MED ORDER — HEPARIN SOD (PORK) LOCK FLUSH 100 UNIT/ML IV SOLN: 500.0000 [IU] | Freq: Once | INTRAVENOUS | Status: DC | PRN

## 2023-03-17 MED ORDER — SODIUM CHLORIDE 0.9% FLUSH: 10.0000 mL | INTRAVENOUS | Status: DC | PRN

## 2023-03-17 MED ORDER — SODIUM CHLORIDE 0.9 % IV SOLN
5.0000 mg/kg | Freq: Once | INTRAVENOUS | Status: AC
  Administered 2023-03-17: 350 mg via INTRAVENOUS
  Filled 2023-03-17: qty 14

## 2023-03-17 NOTE — Patient Instructions (Signed)
Bevacizumab Injection What is this medication? BEVACIZUMAB (be va SIZ yoo mab) treats some types of cancer. It works by blocking a protein that causes cancer cells to grow and multiply. This helps to slow or stop the spread of cancer cells. It is a monoclonal antibody. This medicine may be used for other purposes; ask your health care provider or pharmacist if you have questions. COMMON BRAND NAME(S): Alymsys, Avastin, MVASI, Omer Jack What should I tell my care team before I take this medication? They need to know if you have any of these conditions: Blood clots Coughing up blood Having or recent surgery Heart failure High blood pressure History of a connection between 2 or more body parts that do not usually connect (fistula) History of a tear in your stomach or intestines Protein in your urine An unusual or allergic reaction to bevacizumab, other medications, foods, dyes, or preservatives Pregnant or trying to get pregnant Breast-feeding How should I use this medication? This medication is injected into a vein. It is given by your care team in a hospital or clinic setting. Talk to your care team the use of this medication in children. Special care may be needed. Overdosage: If you think you have taken too much of this medicine contact a poison control center or emergency room at once. NOTE: This medicine is only for you. Do not share this medicine with others. What if I miss a dose? Keep appointments for follow-up doses. It is important not to miss your dose. Call your care team if you are unable to keep an appointment. What may interact with this medication? Interactions are not expected. This list may not describe all possible interactions. Give your health care provider a list of all the medicines, herbs, non-prescription drugs, or dietary supplements you use. Also tell them if you smoke, drink alcohol, or use illegal drugs. Some items may interact with your medicine. What should I  watch for while using this medication? Your condition will be monitored carefully while you are receiving this medication. You may need blood work while taking this medication. This medication may make you feel generally unwell. This is not uncommon as chemotherapy can affect healthy cells as well as cancer cells. Report any side effects. Continue your course of treatment even though you feel ill unless your care team tells you to stop. This medication may increase your risk to bruise or bleed. Call your care team if you notice any unusual bleeding. Before having surgery, talk to your care team to make sure it is ok. This medication can increase the risk of poor healing of your surgical site or wound. You will need to stop this medication for 28 days before surgery. After surgery, wait at least 28 days before restarting this medication. Make sure the surgical site or wound is healed enough before restarting this medication. Talk to your care team if questions. Talk to your care team if you may be pregnant. Serious birth defects can occur if you take this medication during pregnancy and for 6 months after the last dose. Contraception is recommended while taking this medication and for 6 months after the last dose. Your care team can help you find the option that works for you. Do not breastfeed while taking this medication and for 6 months after the last dose. This medication can cause infertility. Talk to your care team if you are concerned about your fertility. What side effects may I notice from receiving this medication? Side effects that you should report  to your care team as soon as possible: Allergic reactions--skin rash, itching, hives, swelling of the face, lips, tongue, or throat Bleeding--bloody or black, tar-like stools, vomiting blood or brown material that looks like coffee grounds, red or dark brown urine, small red or purple spots on skin, unusual bruising or bleeding Blood clot--pain,  swelling, or warmth in the leg, shortness of breath, chest pain Heart attack--pain or tightness in the chest, shoulders, arms, or jaw, nausea, shortness of breath, cold or clammy skin, feeling faint or lightheaded Heart failure--shortness of breath, swelling of the ankles, feet, or hands, sudden weight gain, unusual weakness or fatigue Increase in blood pressure Infection--fever, chills, cough, sore throat, wounds that don't heal, pain or trouble when passing urine, general feeling of discomfort or being unwell Infusion reactions--chest pain, shortness of breath or trouble breathing, feeling faint or lightheaded Kidney injury--decrease in the amount of urine, swelling of the ankles, hands, or feet Stomach pain that is severe, does not go away, or gets worse Stroke--sudden numbness or weakness of the face, arm, or leg, trouble speaking, confusion, trouble walking, loss of balance or coordination, dizziness, severe headache, change in vision Sudden and severe headache, confusion, change in vision, seizures, which may be signs of posterior reversible encephalopathy syndrome (PRES) Side effects that usually do not require medical attention (report to your care team if they continue or are bothersome): Back pain Change in taste Diarrhea Dry skin Increased tears Nosebleed This list may not describe all possible side effects. Call your doctor for medical advice about side effects. You may report side effects to FDA at 1-800-FDA-1088. Where should I keep my medication? This medication is given in a hospital or clinic. It will not be stored at home. NOTE: This sheet is a summary. It may not cover all possible information. If you have questions about this medicine, talk to your doctor, pharmacist, or health care provider.  2024 Elsevier/Gold Standard (2021-10-03 00:00:00) Fluorouracil Injection What is this medication? FLUOROURACIL (flure oh YOOR a sil) treats some types of cancer. It works by slowing  down the growth of cancer cells. This medicine may be used for other purposes; ask your health care provider or pharmacist if you have questions. COMMON BRAND NAME(S): Adrucil What should I tell my care team before I take this medication? They need to know if you have any of these conditions: Blood disorders Dihydropyrimidine dehydrogenase (DPD) deficiency Infection, such as chickenpox, cold sores, herpes Kidney disease Liver disease Poor nutrition Recent or ongoing radiation therapy An unusual or allergic reaction to fluorouracil, other medications, foods, dyes, or preservatives If you or your partner are pregnant or trying to get pregnant Breast-feeding How should I use this medication? This medication is injected into a vein. It is administered by your care team in a hospital or clinic setting. Talk to your care team about the use of this medication in children. Special care may be needed. Overdosage: If you think you have taken too much of this medicine contact a poison control center or emergency room at once. NOTE: This medicine is only for you. Do not share this medicine with others. What if I miss a dose? Keep appointments for follow-up doses. It is important not to miss your dose. Call your care team if you are unable to keep an appointment. What may interact with this medication? Do not take this medication with any of the following: Live virus vaccines This medication may also interact with the following: Medications that treat or prevent blood  clots, such as warfarin, enoxaparin, dalteparin This list may not describe all possible interactions. Give your health care provider a list of all the medicines, herbs, non-prescription drugs, or dietary supplements you use. Also tell them if you smoke, drink alcohol, or use illegal drugs. Some items may interact with your medicine. What should I watch for while using this medication? Your condition will be monitored carefully while you  are receiving this medication. This medication may make you feel generally unwell. This is not uncommon as chemotherapy can affect healthy cells as well as cancer cells. Report any side effects. Continue your course of treatment even though you feel ill unless your care team tells you to stop. In some cases, you may be given additional medications to help with side effects. Follow all directions for their use. This medication may increase your risk of getting an infection. Call your care team for advice if you get a fever, chills, sore throat, or other symptoms of a cold or flu. Do not treat yourself. Try to avoid being around people who are sick. This medication may increase your risk to bruise or bleed. Call your care team if you notice any unusual bleeding. Be careful brushing or flossing your teeth or using a toothpick because you may get an infection or bleed more easily. If you have any dental work done, tell your dentist you are receiving this medication. Avoid taking medications that contain aspirin, acetaminophen, ibuprofen, naproxen, or ketoprofen unless instructed by your care team. These medications may hide a fever. Do not treat diarrhea with over the counter products. Contact your care team if you have diarrhea that lasts more than 2 days or if it is severe and watery. This medication can make you more sensitive to the sun. Keep out of the sun. If you cannot avoid being in the sun, wear protective clothing and sunscreen. Do not use sun lamps, tanning beds, or tanning booths. Talk to your care team if you or your partner wish to become pregnant or think you might be pregnant. This medication can cause serious birth defects if taken during pregnancy and for 3 months after the last dose. A reliable form of contraception is recommended while taking this medication and for 3 months after the last dose. Talk to your care team about effective forms of contraception. Do not father a child while taking  this medication and for 3 months after the last dose. Use a condom while having sex during this time period. Do not breastfeed while taking this medication. This medication may cause infertility. Talk to your care team if you are concerned about your fertility. What side effects may I notice from receiving this medication? Side effects that you should report to your care team as soon as possible: Allergic reactions--skin rash, itching, hives, swelling of the face, lips, tongue, or throat Heart attack--pain or tightness in the chest, shoulders, arms, or jaw, nausea, shortness of breath, cold or clammy skin, feeling faint or lightheaded Heart failure--shortness of breath, swelling of the ankles, feet, or hands, sudden weight gain, unusual weakness or fatigue Heart rhythm changes--fast or irregular heartbeat, dizziness, feeling faint or lightheaded, chest pain, trouble breathing High ammonia level--unusual weakness or fatigue, confusion, loss of appetite, nausea, vomiting, seizures Infection--fever, chills, cough, sore throat, wounds that don't heal, pain or trouble when passing urine, general feeling of discomfort or being unwell Low red blood cell level--unusual weakness or fatigue, dizziness, headache, trouble breathing Pain, tingling, or numbness in the hands or feet,  muscle weakness, change in vision, confusion or trouble speaking, loss of balance or coordination, trouble walking, seizures Redness, swelling, and blistering of the skin over hands and feet Severe or prolonged diarrhea Unusual bruising or bleeding Side effects that usually do not require medical attention (report to your care team if they continue or are bothersome): Dry skin Headache Increased tears Nausea Pain, redness, or swelling with sores inside the mouth or throat Sensitivity to light Vomiting This list may not describe all possible side effects. Call your doctor for medical advice about side effects. You may report  side effects to FDA at 1-800-FDA-1088. Where should I keep my medication? This medication is given in a hospital or clinic. It will not be stored at home. NOTE: This sheet is a summary. It may not cover all possible information. If you have questions about this medicine, talk to your doctor, pharmacist, or health care provider.  2024 Elsevier/Gold Standard (2021-09-23 00:00:00)

## 2023-03-19 ENCOUNTER — Inpatient Hospital Stay: Payer: 59

## 2023-03-19 VITALS — BP 114/87 | HR 92 | Temp 98.1°F | Resp 18 | Ht 68.0 in | Wt 157.8 lb

## 2023-03-19 DIAGNOSIS — C7931 Secondary malignant neoplasm of brain: Secondary | ICD-10-CM | POA: Diagnosis not present

## 2023-03-19 DIAGNOSIS — C7800 Secondary malignant neoplasm of unspecified lung: Secondary | ICD-10-CM

## 2023-03-19 DIAGNOSIS — C189 Malignant neoplasm of colon, unspecified: Secondary | ICD-10-CM

## 2023-03-19 DIAGNOSIS — C7802 Secondary malignant neoplasm of left lung: Secondary | ICD-10-CM | POA: Diagnosis not present

## 2023-03-19 DIAGNOSIS — C787 Secondary malignant neoplasm of liver and intrahepatic bile duct: Secondary | ICD-10-CM | POA: Diagnosis not present

## 2023-03-19 DIAGNOSIS — Z5111 Encounter for antineoplastic chemotherapy: Secondary | ICD-10-CM | POA: Diagnosis not present

## 2023-03-19 DIAGNOSIS — C182 Malignant neoplasm of ascending colon: Secondary | ICD-10-CM

## 2023-03-19 DIAGNOSIS — C7989 Secondary malignant neoplasm of other specified sites: Secondary | ICD-10-CM | POA: Diagnosis not present

## 2023-03-19 DIAGNOSIS — C7971 Secondary malignant neoplasm of right adrenal gland: Secondary | ICD-10-CM | POA: Diagnosis not present

## 2023-03-19 DIAGNOSIS — Z5189 Encounter for other specified aftercare: Secondary | ICD-10-CM | POA: Diagnosis not present

## 2023-03-19 DIAGNOSIS — C7801 Secondary malignant neoplasm of right lung: Secondary | ICD-10-CM | POA: Diagnosis not present

## 2023-03-19 MED ORDER — HEPARIN SOD (PORK) LOCK FLUSH 100 UNIT/ML IV SOLN
500.0000 [IU] | Freq: Once | INTRAVENOUS | Status: AC | PRN
  Administered 2023-03-19: 500 [IU]

## 2023-03-19 MED ORDER — SODIUM CHLORIDE 0.9% FLUSH
10.0000 mL | INTRAVENOUS | Status: DC | PRN
  Administered 2023-03-19: 10 mL

## 2023-03-19 NOTE — Patient Instructions (Signed)
Managing Chemotherapy Side Effects, Adult Chemotherapy is a treatment that uses medicine to kill cancer cells. However, in addition to killing cancer cells, the medicines can also damage healthy cells. The damage to healthy cells can lead to side effects. The exact side effects depend on the specific medicines used. Most of the side effects of chemotherapy go away once treatment is finished. Until then, work closely with your health care providers and take an active role in managing your side effects. What are common side effects of chemotherapy? Increased risk of infection, bruising, or bleeding. Nausea and vomiting. Constipation or diarrhea. Loss of appetite. Hair loss. Mouth or throat sores. Tiredness (fatigue). Tingling, pain, or numbness in the hands and feet. Dry, sensitive, itchy, or sore skin. Sleep disturbances, such as excessive sleepiness. Confusion, anxiety, or mood swings. Memory changes. How to manage the side effects of chemotherapy Medicines Take over-the-counter and prescription medicines only as told by your health care provider. Talk with your health care provider before taking vitamins, herbs, supplements, or over-the-counter medicines. Some of these can interfere with chemotherapy. Activity Get plenty of rest. Get regular exercise by doing activities such as walking, gentle yoga, or tai chi. Return to your normal activities as told by your health care provider. Ask your health care provider what activities are safe for you. Eating and drinking  Talk to a dietitian about what you should eat and drink during cancer treatment. Drink enough fluid to keep your urine pale yellow. If you have side effects that affect eating, these tips may help: Eat smaller meals and snacks often. Drink high-nutrition and high-calorie shakes or supplements. Choose bland and soft foods that are easy to eat. Do not eat foods that are hot, spicy, or hard to swallow. Do not eat raw or  undercooked meat, eggs, or seafood. Always wash fresh fruits and vegetables well before eating them. Skin care If you have sore or itchy skin: Wear soft, comfortable clothing. Apply creams and ointments to your skin as told by your health care provider. If you lose your hair, consider wearing a wig, hat, or scarf to cover your head. You may want to have someone shave your head as you start to lose hair. During outdoor activities, protect your head and skin from the sun by using sunscreen with an SPF of 30 or higher or by wearing protective clothing and a hat. Meet with a hair and skin care specialist for makeup and skin care tips. Apply sunscreen to your scalp as told by your health care provider. General tips Learn as much as you can about your condition. If you are struggling emotionally, talk with a mental health care provider or join a support group. Keep all follow-up visits. This is important. How to prevent infection and bleeding Chemotherapy may lower your blood counts and put you at risk for infection and bleeding. Here are some ways to help prevent problems. Vaccines Talk to your health care provider about vaccines. You should not get any live vaccines, such as the polio, MMR, chickenpox, and shingles vaccines until your health care provider says that it is safe to do so. Do not be around people who have had live vaccines for as long as your health care provider recommends. Make sure you get a yearly flu shot. People who will be near you should also get a yearly flu shot. Social activity Stay away from crowded places where you could be exposed to germs. Do not be around people who may be sick or  people who have symptoms of a fever until they have been fever-free for at least 24 hours. Do not share food, cups, straws, or utensils with other people. Wear a mask when outside the home if your blood counts are low. Cleanliness  Wash your hands often for at least 20 seconds. Also make  sure that other members of your household wash their hands often. Brush your teeth twice daily using a soft toothbrush. Use mouth rinse only as told by your health care provider. Take a bath or shower daily unless your health care provider gives different instructions. General tips Take your temperature regularly, especially if you have chills or feel warm. Check with your health care provider: Before you travel. Before you have a dental procedure. Before you use a swimming pool, hot tub, or swim in a lake or ocean. If you get chemotherapy through an IV or port, check the site every day for signs of infection. Check for redness, swelling, pain, fluid, and warmth. Avoid activities that put you at risk for injury or bleeding. Use an electric razor to shave instead of a blade. Questions to ask your health care provider What are the most common side effects of my treatment? How will they affect my daily life? What can I do to manage them? What are some possible long-term side effects? What are possible complications? What support services are available? What number can I call with questions or concerns? Where to find support Cancer affects the entire family. Find out what family support resources are available from your cancer treatment center. For more support, turn to: Your cancer care team. Friends and family. Your religious community. Other people with cancer. Community-based or online support groups. Where to find more information National Cancer Institute: www.cancer.gov American Cancer Society: www.cancer.org Contact a health care provider if: You bleed or bruise more often. You notice blood in your urine or stool. You have any of these symptoms: A skin rash, or dry or itchy skin. A headache or stiff neck. Cold or flu symptoms. A cough. Persistent nausea or vomiting. Persistent diarrhea. Frequent urination, burning when passing urine, or foul-smelling urine. You cannot eat  because of mouth or throat pain. You are sad, confused, anxious, or depressed. Get help right away if: You have any of these symptoms: A fever or chills. Your health care provider should know about this right away. Redness, swelling, pain, fluid, or warmth near an IV site. Bleeding that you cannot stop. A seizure. You cannot swallow. You have chest pain. You have trouble breathing. A family member or caregiver should get help right away if you have a sudden or unusual change in behavior. These symptoms may be an emergency. Get help right away. Call 911. Do not wait to see if the symptoms will go away. Do not drive yourself to the hospital. Summary Chemotherapy is a treatment that uses medicine to kill cancer cells and can cause side effects. The specific side effects depend on the specific medicines used. Learn as much as you can about your condition. Ask about side effects to watch for and how to treat them. Seek out support and resources from others. Find out what family support resources are available from your cancer treatment center. Let your health care provider know if you notice any new, unusual, or worsening symptoms, especially fever or chills. This information is not intended to replace advice given to you by your health care provider. Make sure you discuss any questions you have with your health  care provider. Document Revised: 05/08/2021 Document Reviewed: 05/08/2021 Elsevier Patient Education  2024 [employer].

## 2023-03-22 ENCOUNTER — Telehealth: Payer: Self-pay | Admitting: Oncology

## 2023-03-22 ENCOUNTER — Inpatient Hospital Stay: Payer: 59

## 2023-03-22 ENCOUNTER — Other Ambulatory Visit: Payer: Self-pay | Admitting: Psychiatry

## 2023-03-22 DIAGNOSIS — F422 Mixed obsessional thoughts and acts: Secondary | ICD-10-CM

## 2023-03-22 NOTE — Telephone Encounter (Signed)
LF 09/17; LV 08/20. Per last visit note: "Continue Luvox to 2 and 1/2 tablets." This rx was ordered after LV, should I keep it the same or change the disp amount and the sig?

## 2023-03-22 NOTE — Telephone Encounter (Signed)
03/22/2023  Patient is not feeling well today - will come tomorrow for her other injection

## 2023-03-23 ENCOUNTER — Inpatient Hospital Stay: Payer: 59

## 2023-03-23 ENCOUNTER — Encounter: Payer: Self-pay | Admitting: Oncology

## 2023-03-23 VITALS — BP 101/58 | HR 114 | Temp 98.4°F | Resp 18 | Wt 152.0 lb

## 2023-03-23 DIAGNOSIS — Z5111 Encounter for antineoplastic chemotherapy: Secondary | ICD-10-CM | POA: Diagnosis not present

## 2023-03-23 DIAGNOSIS — C787 Secondary malignant neoplasm of liver and intrahepatic bile duct: Secondary | ICD-10-CM

## 2023-03-23 DIAGNOSIS — C7801 Secondary malignant neoplasm of right lung: Secondary | ICD-10-CM | POA: Diagnosis not present

## 2023-03-23 DIAGNOSIS — E860 Dehydration: Secondary | ICD-10-CM

## 2023-03-23 DIAGNOSIS — C7971 Secondary malignant neoplasm of right adrenal gland: Secondary | ICD-10-CM | POA: Diagnosis not present

## 2023-03-23 DIAGNOSIS — C182 Malignant neoplasm of ascending colon: Secondary | ICD-10-CM

## 2023-03-23 DIAGNOSIS — Z5189 Encounter for other specified aftercare: Secondary | ICD-10-CM | POA: Diagnosis not present

## 2023-03-23 DIAGNOSIS — C7802 Secondary malignant neoplasm of left lung: Secondary | ICD-10-CM | POA: Diagnosis not present

## 2023-03-23 DIAGNOSIS — C7989 Secondary malignant neoplasm of other specified sites: Secondary | ICD-10-CM | POA: Diagnosis not present

## 2023-03-23 DIAGNOSIS — C7800 Secondary malignant neoplasm of unspecified lung: Secondary | ICD-10-CM

## 2023-03-23 DIAGNOSIS — C189 Malignant neoplasm of colon, unspecified: Secondary | ICD-10-CM | POA: Diagnosis not present

## 2023-03-23 DIAGNOSIS — C7931 Secondary malignant neoplasm of brain: Secondary | ICD-10-CM | POA: Diagnosis not present

## 2023-03-23 MED ORDER — SODIUM CHLORIDE 0.9 % IV SOLN: Freq: Once | INTRAVENOUS | Status: AC

## 2023-03-23 MED ORDER — HEPARIN SOD (PORK) LOCK FLUSH 100 UNIT/ML IV SOLN
250.0000 [IU] | Freq: Once | INTRAVENOUS | Status: AC | PRN
  Administered 2023-03-23: 250 [IU]

## 2023-03-23 MED ORDER — FILGRASTIM-SNDZ 480 MCG/0.8ML IJ SOSY
480.0000 ug | PREFILLED_SYRINGE | Freq: Once | INTRAMUSCULAR | Status: AC
  Administered 2023-03-23: 480 ug via SUBCUTANEOUS
  Filled 2023-03-23: qty 0.8

## 2023-03-23 MED ORDER — SODIUM CHLORIDE 0.9% FLUSH
3.0000 mL | Freq: Once | INTRAVENOUS | Status: AC | PRN
  Administered 2023-03-23: 3 mL

## 2023-03-23 NOTE — Progress Notes (Unsigned)
Patient reports, nausea, abd pain, fatigue and and dizziness- states that she has felt poorly since early yesterday- Zarxio missed from yesterday reordered for this Friday- Patient aware.

## 2023-03-24 ENCOUNTER — Encounter: Payer: Self-pay | Admitting: Oncology

## 2023-03-25 ENCOUNTER — Other Ambulatory Visit: Payer: Self-pay

## 2023-03-25 ENCOUNTER — Inpatient Hospital Stay: Payer: 59

## 2023-03-25 VITALS — BP 115/78 | HR 100 | Temp 98.3°F | Resp 20 | Ht 68.0 in | Wt 154.1 lb

## 2023-03-25 DIAGNOSIS — Z5189 Encounter for other specified aftercare: Secondary | ICD-10-CM | POA: Diagnosis not present

## 2023-03-25 DIAGNOSIS — C7989 Secondary malignant neoplasm of other specified sites: Secondary | ICD-10-CM | POA: Diagnosis not present

## 2023-03-25 DIAGNOSIS — C182 Malignant neoplasm of ascending colon: Secondary | ICD-10-CM

## 2023-03-25 DIAGNOSIS — C7802 Secondary malignant neoplasm of left lung: Secondary | ICD-10-CM | POA: Diagnosis not present

## 2023-03-25 DIAGNOSIS — C787 Secondary malignant neoplasm of liver and intrahepatic bile duct: Secondary | ICD-10-CM | POA: Diagnosis not present

## 2023-03-25 DIAGNOSIS — C7971 Secondary malignant neoplasm of right adrenal gland: Secondary | ICD-10-CM | POA: Diagnosis not present

## 2023-03-25 DIAGNOSIS — C7931 Secondary malignant neoplasm of brain: Secondary | ICD-10-CM | POA: Diagnosis not present

## 2023-03-25 DIAGNOSIS — C7801 Secondary malignant neoplasm of right lung: Secondary | ICD-10-CM | POA: Diagnosis not present

## 2023-03-25 DIAGNOSIS — Z5111 Encounter for antineoplastic chemotherapy: Secondary | ICD-10-CM | POA: Diagnosis not present

## 2023-03-25 DIAGNOSIS — C189 Malignant neoplasm of colon, unspecified: Secondary | ICD-10-CM

## 2023-03-25 MED ORDER — FILGRASTIM-SNDZ 480 MCG/0.8ML IJ SOSY
480.0000 ug | PREFILLED_SYRINGE | Freq: Once | INTRAMUSCULAR | Status: AC
  Administered 2023-03-25: 480 ug via SUBCUTANEOUS
  Filled 2023-03-25: qty 0.8

## 2023-03-25 NOTE — Patient Instructions (Signed)
Filgrastim Injection What is this medication? FILGRASTIM (fil GRA stim) lowers the risk of infection in people who are receiving chemotherapy. It works by helping your body make more white blood cells, which protects your body from infection. It may also be used to help people who have been exposed to high doses of radiation. It can be used to help prepare your body before a stem cell transplant. It works by helping your bone marrow make and release stem cells into the blood. This medicine may be used for other purposes; ask your health care provider or pharmacist if you have questions. COMMON BRAND NAME(S): Neupogen, Nivestym, Releuko, Zarxio What should I tell my care team before I take this medication? They need to know if you have any of these conditions: History of blood diseases, such as sickle cell anemia Kidney disease Recent or ongoing radiation An unusual or allergic reaction to filgrastim, pegfilgrastim, latex, rubber, other medications, foods, dyes, or preservatives Pregnant or trying to get pregnant Breast-feeding How should I use this medication? This medication is injected under the skin or into a vein. It is usually given by your care team in a hospital or clinic setting. It may be given at home. If you get this medication at home, you will be taught how to prepare and give it. Use exactly as directed. Take it as directed on the prescription label at the same time every day. Keep taking it unless your care team tells you to stop. It is important that you put your used needles and syringes in a special sharps container. Do not put them in a trash can. If you do not have a sharps container, call your pharmacist or care team to get one. This medication comes with INSTRUCTIONS FOR USE. Ask your pharmacist for directions on how to use this medication. Read the information carefully. Talk to your pharmacist or care team if you have questions. Talk to your care team about the use of this  medication in children. While it may be prescribed for children for selected conditions, precautions do apply. Overdosage: If you think you have taken too much of this medicine contact a poison control center or emergency room at once. NOTE: This medicine is only for you. Do not share this medicine with others. What if I miss a dose? It is important not to miss any doses. Talk to your care team about what to do if you miss a dose. What may interact with this medication? Medications that may cause a release of neutrophils, such as lithium This list may not describe all possible interactions. Give your health care provider a list of all the medicines, herbs, non-prescription drugs, or dietary supplements you use. Also tell them if you smoke, drink alcohol, or use illegal drugs. Some items may interact with your medicine. What should I watch for while using this medication? Your condition will be monitored carefully while you are receiving this medication. You may need bloodwork while taking this medication. Talk to your care team about your risk of cancer. You may be more at risk for certain types of cancer if you take this medication. What side effects may I notice from receiving this medication? Side effects that you should report to your care team as soon as possible: Allergic reactions--skin rash, itching, hives, swelling of the face, lips, tongue, or throat Capillary leak syndrome--stomach or muscle pain, unusual weakness or fatigue, feeling faint or lightheaded, decrease in the amount of urine, swelling of the ankles, hands, or   feet, trouble breathing High white blood cell level--fever, fatigue, trouble breathing, night sweats, change in vision, weight loss Inflammation of the aorta--fever, fatigue, back, chest, or stomach pain, severe headache Kidney injury (glomerulonephritis)--decrease in the amount of urine, red or dark brown urine, foamy or bubbly urine, swelling of the ankles, hands, or  feet Shortness of breath or trouble breathing Spleen injury--pain in upper left stomach or shoulder Unusual bruising or bleeding Side effects that usually do not require medical attention (report to your care team if they continue or are bothersome): Back pain Bone pain Fatigue Fever Headache Nausea This list may not describe all possible side effects. Call your doctor for medical advice about side effects. You may report side effects to FDA at 1-800-FDA-1088. Where should I keep my medication? Keep out of the reach of children and pets. Keep this medication in the original packaging until you are ready to take it. Protect from light. See product for storage information. Each product may have different instructions. Get rid of any unused medication after the expiration date. To get rid of medications that are no longer needed or have expired: Take the medication to a medications take-back program. Check with your pharmacy or law enforcement to find a location. If you cannot return the medication, ask your pharmacist or care team how to get rid of this medication safely. NOTE: This sheet is a summary. It may not cover all possible information. If you have questions about this medicine, talk to your doctor, pharmacist, or health care provider.  2024 Elsevier/Gold Standard (2021-10-09 00:00:00)  

## 2023-03-26 ENCOUNTER — Inpatient Hospital Stay: Payer: 59

## 2023-03-26 VITALS — BP 99/74 | HR 103 | Temp 97.9°F | Resp 18 | Ht 68.0 in | Wt 155.0 lb

## 2023-03-26 DIAGNOSIS — Z5111 Encounter for antineoplastic chemotherapy: Secondary | ICD-10-CM | POA: Diagnosis not present

## 2023-03-26 DIAGNOSIS — Z5189 Encounter for other specified aftercare: Secondary | ICD-10-CM | POA: Diagnosis not present

## 2023-03-26 DIAGNOSIS — C7931 Secondary malignant neoplasm of brain: Secondary | ICD-10-CM | POA: Diagnosis not present

## 2023-03-26 DIAGNOSIS — C7971 Secondary malignant neoplasm of right adrenal gland: Secondary | ICD-10-CM | POA: Diagnosis not present

## 2023-03-26 DIAGNOSIS — C7800 Secondary malignant neoplasm of unspecified lung: Secondary | ICD-10-CM

## 2023-03-26 DIAGNOSIS — C7989 Secondary malignant neoplasm of other specified sites: Secondary | ICD-10-CM | POA: Diagnosis not present

## 2023-03-26 DIAGNOSIS — C7801 Secondary malignant neoplasm of right lung: Secondary | ICD-10-CM | POA: Diagnosis not present

## 2023-03-26 DIAGNOSIS — C182 Malignant neoplasm of ascending colon: Secondary | ICD-10-CM

## 2023-03-26 DIAGNOSIS — C7802 Secondary malignant neoplasm of left lung: Secondary | ICD-10-CM | POA: Diagnosis not present

## 2023-03-26 DIAGNOSIS — C189 Malignant neoplasm of colon, unspecified: Secondary | ICD-10-CM | POA: Diagnosis not present

## 2023-03-26 DIAGNOSIS — C787 Secondary malignant neoplasm of liver and intrahepatic bile duct: Secondary | ICD-10-CM

## 2023-03-26 MED ORDER — FILGRASTIM-SNDZ 480 MCG/0.8ML IJ SOSY
480.0000 ug | PREFILLED_SYRINGE | Freq: Once | INTRAMUSCULAR | Status: AC
  Administered 2023-03-26: 480 ug via SUBCUTANEOUS
  Filled 2023-03-26: qty 0.8

## 2023-03-26 NOTE — Patient Instructions (Signed)
Filgrastim Injection What is this medication? FILGRASTIM (fil GRA stim) lowers the risk of infection in people who are receiving chemotherapy. It works by helping your body make more white blood cells, which protects your body from infection. It may also be used to help people who have been exposed to high doses of radiation. It can be used to help prepare your body before a stem cell transplant. It works by helping your bone marrow make and release stem cells into the blood. This medicine may be used for other purposes; ask your health care provider or pharmacist if you have questions. COMMON BRAND NAME(S): Neupogen, Nivestym, Releuko, Zarxio What should I tell my care team before I take this medication? They need to know if you have any of these conditions: History of blood diseases, such as sickle cell anemia Kidney disease Recent or ongoing radiation An unusual or allergic reaction to filgrastim, pegfilgrastim, latex, rubber, other medications, foods, dyes, or preservatives Pregnant or trying to get pregnant Breast-feeding How should I use this medication? This medication is injected under the skin or into a vein. It is usually given by your care team in a hospital or clinic setting. It may be given at home. If you get this medication at home, you will be taught how to prepare and give it. Use exactly as directed. Take it as directed on the prescription label at the same time every day. Keep taking it unless your care team tells you to stop. It is important that you put your used needles and syringes in a special sharps container. Do not put them in a trash can. If you do not have a sharps container, call your pharmacist or care team to get one. This medication comes with INSTRUCTIONS FOR USE. Ask your pharmacist for directions on how to use this medication. Read the information carefully. Talk to your pharmacist or care team if you have questions. Talk to your care team about the use of this  medication in children. While it may be prescribed for children for selected conditions, precautions do apply. Overdosage: If you think you have taken too much of this medicine contact a poison control center or emergency room at once. NOTE: This medicine is only for you. Do not share this medicine with others. What if I miss a dose? It is important not to miss any doses. Talk to your care team about what to do if you miss a dose. What may interact with this medication? Medications that may cause a release of neutrophils, such as lithium This list may not describe all possible interactions. Give your health care provider a list of all the medicines, herbs, non-prescription drugs, or dietary supplements you use. Also tell them if you smoke, drink alcohol, or use illegal drugs. Some items may interact with your medicine. What should I watch for while using this medication? Your condition will be monitored carefully while you are receiving this medication. You may need bloodwork while taking this medication. Talk to your care team about your risk of cancer. You may be more at risk for certain types of cancer if you take this medication. What side effects may I notice from receiving this medication? Side effects that you should report to your care team as soon as possible: Allergic reactions--skin rash, itching, hives, swelling of the face, lips, tongue, or throat Capillary leak syndrome--stomach or muscle pain, unusual weakness or fatigue, feeling faint or lightheaded, decrease in the amount of urine, swelling of the ankles, hands, or   feet, trouble breathing High white blood cell level--fever, fatigue, trouble breathing, night sweats, change in vision, weight loss Inflammation of the aorta--fever, fatigue, back, chest, or stomach pain, severe headache Kidney injury (glomerulonephritis)--decrease in the amount of urine, red or dark brown urine, foamy or bubbly urine, swelling of the ankles, hands, or  feet Shortness of breath or trouble breathing Spleen injury--pain in upper left stomach or shoulder Unusual bruising or bleeding Side effects that usually do not require medical attention (report to your care team if they continue or are bothersome): Back pain Bone pain Fatigue Fever Headache Nausea This list may not describe all possible side effects. Call your doctor for medical advice about side effects. You may report side effects to FDA at 1-800-FDA-1088. Where should I keep my medication? Keep out of the reach of children and pets. Keep this medication in the original packaging until you are ready to take it. Protect from light. See product for storage information. Each product may have different instructions. Get rid of any unused medication after the expiration date. To get rid of medications that are no longer needed or have expired: Take the medication to a medications take-back program. Check with your pharmacy or law enforcement to find a location. If you cannot return the medication, ask your pharmacist or care team how to get rid of this medication safely. NOTE: This sheet is a summary. It may not cover all possible information. If you have questions about this medicine, talk to your doctor, pharmacist, or health care provider.  2024 Elsevier/Gold Standard (2021-10-09 00:00:00)  

## 2023-03-28 NOTE — Progress Notes (Signed)
[name]  [address] [place],  [place]  [address] [phone]  Clinic Day:  03/29/2023  Referring physician: Alinda Deem, MD  HISTORY OF PRESENT ILLNESS:  The patient is a 57 y.o. female with  metastatic colon cancer, which includes spread of disease to her abdominal cavity, lungs, brain and right adrenal gland.  She comes in today to  be evaluated before heading into her 15th cycle of  FOLFOX/Avastin.  The patient claims to have tolerated her 14th cycle of FOLFOX/Avastin fairly well.  However, she has had more nausea over the past few days.  This is led to her throwing up on 1 occasion.  The patient claims this usually happens 1-2 times with every cycle of chemotherapy.  However, she has not bounced back as quickly from her GI symptoms as she had in the past.  Of note, her oxaliplatin dose was reduced with her 12th cycle of chemotherapy due to the increased numbness in her fingers.   With respect to her colon cancer history, she is status post a right hemicolectomy in early October 2018, followed by 12 cycles of adjuvant FOLFOX chemotherapy, which were completed in April 2019.  In December 2021, she underwent a left cerebellar metastasectomy, whose pathology was consistent with metastatic colon cancer.  Tumor testing did come back MMR normal. CT scans revealed evidence of her cancer being in multiple locations, for which she took 40 cycles of FOLFIRI/Avastin.  As follow-up scans revealed liver metastasis, she was placed back on FOLFOX/Avastin, for which she continues to take.  PHYSICAL EXAM:  Blood pressure 116/76, pulse 99, temperature 99.1 F (37.3 C), resp. rate 14, height 5\' 8"  (1.727 m), weight 155 lb 1.6 oz (70.4 kg), last menstrual period 02/22/2019, SpO2 93%. Body mass index is 23.58 kg/m.  Performance status (ECOG): 1 - Symptomatic but completely ambulatory  Physical Exam Vitals and nursing note reviewed.  Constitutional:       General: She is not in acute distress.    Appearance: Normal appearance.  HENT:     Head: Normocephalic and atraumatic.     Mouth/Throat:     Mouth: Mucous membranes are moist.     Pharynx: Oropharynx is clear. No oropharyngeal exudate or posterior oropharyngeal erythema.  Eyes:     General: No scleral icterus.    Extraocular Movements: Extraocular movements intact.     Conjunctiva/sclera: Conjunctivae normal.     Pupils: Pupils are equal, round, and reactive to light.  Cardiovascular:     Rate and Rhythm: Normal rate and regular rhythm.     Heart sounds: Normal heart sounds. No murmur heard.    No friction rub. No gallop.  Pulmonary:     Effort: Pulmonary effort is normal.     Breath sounds: Normal breath sounds. No wheezing, rhonchi or rales.  Abdominal:     General: There is no distension.     Palpations: Abdomen is soft. There is no hepatomegaly, splenomegaly or mass.     Tenderness: There is no abdominal tenderness.  Musculoskeletal:        General: Normal range of motion.     Cervical back: Normal range of motion and neck supple. No tenderness.     Right lower leg: No edema.     Left lower leg: No edema.  Lymphadenopathy:     Cervical: No cervical adenopathy.     Upper Body:     Right upper body: No supraclavicular or axillary adenopathy.  Left upper body: No supraclavicular or axillary adenopathy.     Lower Body: No right inguinal adenopathy. No left inguinal adenopathy.  Skin:    General: Skin is warm and dry.     Coloration: Skin is not jaundiced.     Findings: No rash.  Neurological:     Mental Status: She is alert and oriented to person, place, and time.     Cranial Nerves: No cranial nerve deficit.  Psychiatric:        Mood and Affect: Mood normal.        Behavior: Behavior normal.        Thought Content: Thought content normal.     LABS:    Latest Reference Range & Units 03/29/23 00:00  WBC  3.6 (E)  RBC 3.87 - 5.11  3.64 ! (E)  Hemoglobin 12.0 -  16.0  12.3 (E)  HCT 36 - 46  37 (E)  Platelets 150 - 400 K/uL 52 ! (E)  NEUT#  1.80 (E)  !: Data is abnormal (E): External lab result  Latest Reference Range & Units 03/29/23 14:41  Sodium 135 - 145 mmol/L 142  Potassium 3.5 - 5.1 mmol/L 4.4  Chloride 98 - 111 mmol/L 104  CO2 22 - 32 mmol/L 27  Glucose 70 - 99 mg/dL 161 (H)  BUN 6 - 20 mg/dL 21 (H)  Creatinine 0.96 - 1.00 mg/dL 0.45 (H)  Calcium 8.9 - 10.3 mg/dL 9.9  Anion gap 5 - 15  11  Alkaline Phosphatase 38 - 126 U/L 126  Albumin 3.5 - 5.0 g/dL 3.8  AST 15 - 41 U/L 24  ALT 0 - 44 U/L 22  Total Protein 6.5 - 8.1 g/dL 6.7  Total Bilirubin 0.3 - 1.2 mg/dL 0.7  GFR, Est Non African American >60 mL/min 38 (L)  CBC W DIFFERENTIAL ([facility] SCANNED REPORT)  Rpt  (H): Data is abnormally high (L): Data is abnormally low Rpt: View report in Results Review for more information  ASSESSMENT & PLAN:  Assessment/Plan:  A 57 y.o. female with metastatic colon cancer.  Despite her low white cells and platelets, she will proceed with her 15th cycle of FOLFOX/Avastin this week.  Christeen Douglas will be given with this cycle of chemotherapy.   With her progressive cytopenias, I once again explained to the patient that her future cycles of chemotherapy may need to be spaced out to every 3 weeks to give her bone marrow an extra week to recuperate from each successive cycle of treatment.  For now, she wishes to keep her treatment going every 2 weeks.  I will see her back in 2 weeks before she heads into her 16th cycle of FOLFOX/Avastin.  The patient understands all the plans discussed today and is in agreement with them.  Farrah Skoda Kirby Funk, MD

## 2023-03-29 ENCOUNTER — Inpatient Hospital Stay (HOSPITAL_BASED_OUTPATIENT_CLINIC_OR_DEPARTMENT_OTHER): Payer: 59 | Admitting: Oncology

## 2023-03-29 ENCOUNTER — Encounter: Payer: Self-pay | Admitting: Oncology

## 2023-03-29 ENCOUNTER — Inpatient Hospital Stay: Payer: 59

## 2023-03-29 ENCOUNTER — Other Ambulatory Visit: Payer: Self-pay | Admitting: Oncology

## 2023-03-29 DIAGNOSIS — C7931 Secondary malignant neoplasm of brain: Secondary | ICD-10-CM | POA: Diagnosis not present

## 2023-03-29 DIAGNOSIS — C189 Malignant neoplasm of colon, unspecified: Secondary | ICD-10-CM

## 2023-03-29 DIAGNOSIS — C182 Malignant neoplasm of ascending colon: Secondary | ICD-10-CM

## 2023-03-29 DIAGNOSIS — Z5111 Encounter for antineoplastic chemotherapy: Secondary | ICD-10-CM | POA: Diagnosis not present

## 2023-03-29 DIAGNOSIS — C7802 Secondary malignant neoplasm of left lung: Secondary | ICD-10-CM | POA: Diagnosis not present

## 2023-03-29 DIAGNOSIS — C787 Secondary malignant neoplasm of liver and intrahepatic bile duct: Secondary | ICD-10-CM | POA: Diagnosis not present

## 2023-03-29 DIAGNOSIS — C7801 Secondary malignant neoplasm of right lung: Secondary | ICD-10-CM | POA: Diagnosis not present

## 2023-03-29 DIAGNOSIS — C7989 Secondary malignant neoplasm of other specified sites: Secondary | ICD-10-CM | POA: Diagnosis not present

## 2023-03-29 DIAGNOSIS — C7800 Secondary malignant neoplasm of unspecified lung: Secondary | ICD-10-CM

## 2023-03-29 DIAGNOSIS — C7971 Secondary malignant neoplasm of right adrenal gland: Secondary | ICD-10-CM | POA: Diagnosis not present

## 2023-03-29 DIAGNOSIS — Z5189 Encounter for other specified aftercare: Secondary | ICD-10-CM | POA: Diagnosis not present

## 2023-03-29 LAB — CMP (CANCER CENTER ONLY)
ALT: 22 U/L (ref 0–44)
AST: 24 U/L (ref 15–41)
Albumin: 3.8 g/dL (ref 3.5–5.0)
Alkaline Phosphatase: 126 U/L (ref 38–126)
Anion gap: 11 (ref 5–15)
BUN: 21 mg/dL — ABNORMAL HIGH (ref 6–20)
CO2: 27 mmol/L (ref 22–32)
Calcium: 9.9 mg/dL (ref 8.9–10.3)
Chloride: 104 mmol/L (ref 98–111)
Creatinine: 1.59 mg/dL — ABNORMAL HIGH (ref 0.44–1.00)
GFR, Estimated: 38 mL/min — ABNORMAL LOW (ref >60)
Glucose, Bld: 146 mg/dL — ABNORMAL HIGH (ref 70–99)
Potassium: 4.4 mmol/L (ref 3.5–5.1)
Sodium: 142 mmol/L (ref 135–145)
Total Bilirubin: 0.7 mg/dL (ref 0.3–1.2)
Total Protein: 6.7 g/dL (ref 6.5–8.1)

## 2023-03-29 LAB — CBC AND DIFFERENTIAL
HCT: 37 (ref 36–46)
Hemoglobin: 12.3 (ref 12.0–16.0)
Neutrophils Absolute: 1.8
Platelets: 52 10*3/uL — AB (ref 150–400)
WBC: 3.6

## 2023-03-29 LAB — TOTAL PROTEIN, URINE DIPSTICK: Protein, ur: NEGATIVE mg/dL

## 2023-03-29 LAB — CBC: RBC: 3.64 — AB (ref 3.87–5.11)

## 2023-03-29 LAB — CBC W DIFFERENTIAL (~~LOC~~ CC SCANNED REPORT)

## 2023-03-29 MED FILL — Fosaprepitant Dimeglumine For IV Infusion 150 MG (Base Eq): INTRAVENOUS | Qty: 5 | Status: AC

## 2023-03-30 ENCOUNTER — Inpatient Hospital Stay: Payer: 59

## 2023-03-30 ENCOUNTER — Encounter: Payer: Self-pay | Admitting: Oncology

## 2023-03-30 VITALS — BP 112/77 | HR 97 | Temp 97.5°F | Resp 18 | Wt 158.0 lb

## 2023-03-30 DIAGNOSIS — C7931 Secondary malignant neoplasm of brain: Secondary | ICD-10-CM | POA: Diagnosis not present

## 2023-03-30 DIAGNOSIS — C189 Malignant neoplasm of colon, unspecified: Secondary | ICD-10-CM | POA: Diagnosis not present

## 2023-03-30 DIAGNOSIS — C787 Secondary malignant neoplasm of liver and intrahepatic bile duct: Secondary | ICD-10-CM | POA: Diagnosis not present

## 2023-03-30 DIAGNOSIS — C7801 Secondary malignant neoplasm of right lung: Secondary | ICD-10-CM | POA: Diagnosis not present

## 2023-03-30 DIAGNOSIS — C7971 Secondary malignant neoplasm of right adrenal gland: Secondary | ICD-10-CM | POA: Diagnosis not present

## 2023-03-30 DIAGNOSIS — C7989 Secondary malignant neoplasm of other specified sites: Secondary | ICD-10-CM | POA: Diagnosis not present

## 2023-03-30 DIAGNOSIS — C7800 Secondary malignant neoplasm of unspecified lung: Secondary | ICD-10-CM

## 2023-03-30 DIAGNOSIS — Z5111 Encounter for antineoplastic chemotherapy: Secondary | ICD-10-CM | POA: Diagnosis not present

## 2023-03-30 DIAGNOSIS — C182 Malignant neoplasm of ascending colon: Secondary | ICD-10-CM

## 2023-03-30 DIAGNOSIS — C7802 Secondary malignant neoplasm of left lung: Secondary | ICD-10-CM | POA: Diagnosis not present

## 2023-03-30 DIAGNOSIS — Z5189 Encounter for other specified aftercare: Secondary | ICD-10-CM | POA: Diagnosis not present

## 2023-03-30 MED ORDER — DEXTROSE 5 % IV SOLN: Freq: Once | INTRAVENOUS | Status: AC

## 2023-03-30 MED ORDER — SODIUM CHLORIDE 0.9 % IV SOLN: Freq: Once | INTRAVENOUS | Status: AC

## 2023-03-30 MED ORDER — FLUOROURACIL CHEMO INJECTION 2.5 GM/50ML
400.0000 mg/m2 | Freq: Once | INTRAVENOUS | Status: AC
Start: 2023-03-30 — End: 2023-03-30
  Administered 2023-03-30: 700 mg via INTRAVENOUS
  Filled 2023-03-30: qty 14

## 2023-03-30 MED ORDER — DEXTROSE 5 % IV SOLN
Freq: Once | INTRAVENOUS | Status: DC
Start: 2023-03-30 — End: 2023-03-30

## 2023-03-30 MED ORDER — HEPARIN SOD (PORK) LOCK FLUSH 100 UNIT/ML IV SOLN
500.0000 [IU] | Freq: Once | INTRAVENOUS | Status: DC | PRN
Start: 2023-03-30 — End: 2023-03-30

## 2023-03-30 MED ORDER — SODIUM CHLORIDE 0.9 % IV SOLN
150.0000 mg | Freq: Once | INTRAVENOUS | Status: AC
  Administered 2023-03-30: 150 mg via INTRAVENOUS
  Filled 2023-03-30: qty 150

## 2023-03-30 MED ORDER — DIPHENHYDRAMINE HCL 50 MG/ML IJ SOLN
25.0000 mg | Freq: Once | INTRAMUSCULAR | Status: AC
Start: 2023-03-30 — End: 2023-03-30
  Administered 2023-03-30: 25 mg via INTRAVENOUS
  Filled 2023-03-30: qty 1

## 2023-03-30 MED ORDER — DEXAMETHASONE SODIUM PHOSPHATE 10 MG/ML IJ SOLN
10.0000 mg | Freq: Once | INTRAMUSCULAR | Status: AC
  Administered 2023-03-30: 10 mg via INTRAVENOUS
  Filled 2023-03-30: qty 1

## 2023-03-30 MED ORDER — FAMOTIDINE IN NACL 20-0.9 MG/50ML-% IV SOLN
20.0000 mg | Freq: Once | INTRAVENOUS | Status: AC
  Administered 2023-03-30: 20 mg via INTRAVENOUS
  Filled 2023-03-30: qty 50

## 2023-03-30 MED ORDER — PALONOSETRON HCL INJECTION 0.25 MG/5ML
0.2500 mg | Freq: Once | INTRAVENOUS | Status: AC
  Administered 2023-03-30: 0.25 mg via INTRAVENOUS
  Filled 2023-03-30: qty 5

## 2023-03-30 MED ORDER — SODIUM CHLORIDE 0.9 % IV SOLN
2250.0000 mg/m2 | INTRAVENOUS | Status: DC
  Administered 2023-03-30: 4050 mg via INTRAVENOUS
  Filled 2023-03-30: qty 81

## 2023-03-30 MED ORDER — DEXTROSE 5 % IV SOLN
63.7500 mg/m2 | Freq: Once | INTRAVENOUS | Status: AC
  Administered 2023-03-30: 115 mg via INTRAVENOUS
  Filled 2023-03-30: qty 20

## 2023-03-30 MED ORDER — SODIUM CHLORIDE 0.9% FLUSH: 10.0000 mL | INTRAVENOUS | Status: DC | PRN

## 2023-03-30 MED ORDER — SODIUM CHLORIDE 0.9 % IV SOLN
5.0000 mg/kg | Freq: Once | INTRAVENOUS | Status: AC
  Administered 2023-03-30: 350 mg via INTRAVENOUS
  Filled 2023-03-30: qty 14

## 2023-03-30 NOTE — Patient Instructions (Signed)
The chemotherapy medication bag should finish at 46 hours, 96 hours, or 7 days. For example, if your pump is scheduled for 46 hours and it was put on at 4:00 p.m., it should finish at 2:00 p.m. the day it is scheduled to come off regardless of your appointment time.     Estimated time to finish at 1pm .   If the display on your pump reads "Low Volume" and it is beeping, take the batteries out of the pump and come to the cancer center for it to be taken off.   If the pump alarms go off prior to the pump reading "Low Volume" then call [phone] and someone can assist you.  If the plunger comes out and the chemotherapy medication is leaking out, please use your home chemo spill kit to clean up the spill. Do NOT use paper towels or other household products.  If you have problems or questions regarding your pump, please call either [phone] (24 hours a day) or the cancer center Monday-Friday 8:00 a.m.- 4:30 p.m. at the clinic number and we will assist you. If you are unable to get assistance, then go to the nearest Emergency Department and ask the staff to contact the IV team for assistance.

## 2023-03-31 ENCOUNTER — Ambulatory Visit: Payer: 59

## 2023-04-01 ENCOUNTER — Inpatient Hospital Stay: Payer: 59

## 2023-04-01 VITALS — BP 135/86 | HR 98 | Temp 98.0°F | Resp 18

## 2023-04-01 DIAGNOSIS — C189 Malignant neoplasm of colon, unspecified: Secondary | ICD-10-CM | POA: Diagnosis not present

## 2023-04-01 DIAGNOSIS — Z5111 Encounter for antineoplastic chemotherapy: Secondary | ICD-10-CM | POA: Diagnosis not present

## 2023-04-01 DIAGNOSIS — C7989 Secondary malignant neoplasm of other specified sites: Secondary | ICD-10-CM | POA: Diagnosis not present

## 2023-04-01 DIAGNOSIS — C7801 Secondary malignant neoplasm of right lung: Secondary | ICD-10-CM | POA: Diagnosis not present

## 2023-04-01 DIAGNOSIS — Z5189 Encounter for other specified aftercare: Secondary | ICD-10-CM | POA: Diagnosis not present

## 2023-04-01 DIAGNOSIS — C7931 Secondary malignant neoplasm of brain: Secondary | ICD-10-CM | POA: Diagnosis not present

## 2023-04-01 DIAGNOSIS — C7971 Secondary malignant neoplasm of right adrenal gland: Secondary | ICD-10-CM | POA: Diagnosis not present

## 2023-04-01 DIAGNOSIS — C7802 Secondary malignant neoplasm of left lung: Secondary | ICD-10-CM | POA: Diagnosis not present

## 2023-04-01 DIAGNOSIS — C182 Malignant neoplasm of ascending colon: Secondary | ICD-10-CM

## 2023-04-01 DIAGNOSIS — C787 Secondary malignant neoplasm of liver and intrahepatic bile duct: Secondary | ICD-10-CM | POA: Diagnosis not present

## 2023-04-01 MED ORDER — SODIUM CHLORIDE 0.9 % IV SOLN: Freq: Once | INTRAVENOUS | Status: AC

## 2023-04-01 MED ORDER — SODIUM CHLORIDE 0.9% FLUSH
10.0000 mL | INTRAVENOUS | Status: DC | PRN
  Administered 2023-04-01: 10 mL

## 2023-04-01 NOTE — Patient Instructions (Signed)
Managing Chemotherapy Side Effects, Adult Chemotherapy is a treatment that uses medicine to kill cancer cells. However, in addition to killing cancer cells, the medicines can also damage healthy cells. The damage to healthy cells can lead to side effects. The exact side effects depend on the specific medicines used. Most of the side effects of chemotherapy go away once treatment is finished. Until then, work closely with your health care providers and take an active role in managing your side effects. What are common side effects of chemotherapy? Increased risk of infection, bruising, or bleeding. Nausea and vomiting. Constipation or diarrhea. Loss of appetite. Hair loss. Mouth or throat sores. Tiredness (fatigue). Tingling, pain, or numbness in the hands and feet. Dry, sensitive, itchy, or sore skin. Sleep disturbances, such as excessive sleepiness. Confusion, anxiety, or mood swings. Memory changes. How to manage the side effects of chemotherapy Medicines Take over-the-counter and prescription medicines only as told by your health care provider. Talk with your health care provider before taking vitamins, herbs, supplements, or over-the-counter medicines. Some of these can interfere with chemotherapy. Activity Get plenty of rest. Get regular exercise by doing activities such as walking, gentle yoga, or tai chi. Return to your normal activities as told by your health care provider. Ask your health care provider what activities are safe for you. Eating and drinking  Talk to a dietitian about what you should eat and drink during cancer treatment. Drink enough fluid to keep your urine pale yellow. If you have side effects that affect eating, these tips may help: Eat smaller meals and snacks often. Drink high-nutrition and high-calorie shakes or supplements. Choose bland and soft foods that are easy to eat. Do not eat foods that are hot, spicy, or hard to swallow. Do not eat raw or  undercooked meat, eggs, or seafood. Always wash fresh fruits and vegetables well before eating them. Skin care If you have sore or itchy skin: Wear soft, comfortable clothing. Apply creams and ointments to your skin as told by your health care provider. If you lose your hair, consider wearing a wig, hat, or scarf to cover your head. You may want to have someone shave your head as you start to lose hair. During outdoor activities, protect your head and skin from the sun by using sunscreen with an SPF of 30 or higher or by wearing protective clothing and a hat. Meet with a hair and skin care specialist for makeup and skin care tips. Apply sunscreen to your scalp as told by your health care provider. General tips Learn as much as you can about your condition. If you are struggling emotionally, talk with a mental health care provider or join a support group. Keep all follow-up visits. This is important. How to prevent infection and bleeding Chemotherapy may lower your blood counts and put you at risk for infection and bleeding. Here are some ways to help prevent problems. Vaccines Talk to your health care provider about vaccines. You should not get any live vaccines, such as the polio, MMR, chickenpox, and shingles vaccines until your health care provider says that it is safe to do so. Do not be around people who have had live vaccines for as long as your health care provider recommends. Make sure you get a yearly flu shot. People who will be near you should also get a yearly flu shot. Social activity Stay away from crowded places where you could be exposed to germs. Do not be around people who may be sick or  people who have symptoms of a fever until they have been fever-free for at least 24 hours. Do not share food, cups, straws, or utensils with other people. Wear a mask when outside the home if your blood counts are low. Cleanliness  Wash your hands often for at least 20 seconds. Also make  sure that other members of your household wash their hands often. Brush your teeth twice daily using a soft toothbrush. Use mouth rinse only as told by your health care provider. Take a bath or shower daily unless your health care provider gives different instructions. General tips Take your temperature regularly, especially if you have chills or feel warm. Check with your health care provider: Before you travel. Before you have a dental procedure. Before you use a swimming pool, hot tub, or swim in a lake or ocean. If you get chemotherapy through an IV or port, check the site every day for signs of infection. Check for redness, swelling, pain, fluid, and warmth. Avoid activities that put you at risk for injury or bleeding. Use an electric razor to shave instead of a blade. Questions to ask your health care provider What are the most common side effects of my treatment? How will they affect my daily life? What can I do to manage them? What are some possible long-term side effects? What are possible complications? What support services are available? What number can I call with questions or concerns? Where to find support Cancer affects the entire family. Find out what family support resources are available from your cancer treatment center. For more support, turn to: Your cancer care team. Friends and family. Your religious community. Other people with cancer. Community-based or online support groups. Where to find more information National Cancer Institute: www.cancer.gov American Cancer Society: www.cancer.org Contact a health care provider if: You bleed or bruise more often. You notice blood in your urine or stool. You have any of these symptoms: A skin rash, or dry or itchy skin. A headache or stiff neck. Cold or flu symptoms. A cough. Persistent nausea or vomiting. Persistent diarrhea. Frequent urination, burning when passing urine, or foul-smelling urine. You cannot eat  because of mouth or throat pain. You are sad, confused, anxious, or depressed. Get help right away if: You have any of these symptoms: A fever or chills. Your health care provider should know about this right away. Redness, swelling, pain, fluid, or warmth near an IV site. Bleeding that you cannot stop. A seizure. You cannot swallow. You have chest pain. You have trouble breathing. A family member or caregiver should get help right away if you have a sudden or unusual change in behavior. These symptoms may be an emergency. Get help right away. Call 911. Do not wait to see if the symptoms will go away. Do not drive yourself to the hospital. Summary Chemotherapy is a treatment that uses medicine to kill cancer cells and can cause side effects. The specific side effects depend on the specific medicines used. Learn as much as you can about your condition. Ask about side effects to watch for and how to treat them. Seek out support and resources from others. Find out what family support resources are available from your cancer treatment center. Let your health care provider know if you notice any new, unusual, or worsening symptoms, especially fever or chills. This information is not intended to replace advice given to you by your health care provider. Make sure you discuss any questions you have with your health  care provider. Document Revised: 05/08/2021 Document Reviewed: 05/08/2021 Elsevier Patient Education  2024 [employer].

## 2023-04-03 ENCOUNTER — Ambulatory Visit
Admission: RE | Admit: 2023-04-03 | Discharge: 2023-04-03 | Disposition: A | Discharge status: Home / Self Care | Payer: 59 | Source: Ambulatory Visit | Attending: Internal Medicine | Admitting: Internal Medicine

## 2023-04-03 ENCOUNTER — Other Ambulatory Visit: Payer: Self-pay | Admitting: Psychiatry

## 2023-04-03 DIAGNOSIS — C7931 Secondary malignant neoplasm of brain: Secondary | ICD-10-CM

## 2023-04-03 DIAGNOSIS — F333 Major depressive disorder, recurrent, severe with psychotic symptoms: Secondary | ICD-10-CM

## 2023-04-03 MED ORDER — GADOPICLENOL 0.5 MMOL/ML IV SOLN
7.0000 mL | Freq: Once | INTRAVENOUS | Status: AC | PRN
  Administered 2023-04-03: 7 mL via INTRAVENOUS

## 2023-04-05 ENCOUNTER — Inpatient Hospital Stay: Payer: 59 | Attending: Oncology

## 2023-04-05 VITALS — BP 115/77 | HR 92 | Temp 97.6°F | Resp 18 | Wt 155.0 lb

## 2023-04-05 DIAGNOSIS — C7801 Secondary malignant neoplasm of right lung: Secondary | ICD-10-CM | POA: Diagnosis not present

## 2023-04-05 DIAGNOSIS — C7802 Secondary malignant neoplasm of left lung: Secondary | ICD-10-CM | POA: Diagnosis not present

## 2023-04-05 DIAGNOSIS — C182 Malignant neoplasm of ascending colon: Secondary | ICD-10-CM

## 2023-04-05 DIAGNOSIS — C189 Malignant neoplasm of colon, unspecified: Secondary | ICD-10-CM | POA: Diagnosis not present

## 2023-04-05 DIAGNOSIS — C7971 Secondary malignant neoplasm of right adrenal gland: Secondary | ICD-10-CM | POA: Insufficient documentation

## 2023-04-05 DIAGNOSIS — C7931 Secondary malignant neoplasm of brain: Secondary | ICD-10-CM | POA: Insufficient documentation

## 2023-04-05 DIAGNOSIS — Z803 Family history of malignant neoplasm of breast: Secondary | ICD-10-CM | POA: Diagnosis not present

## 2023-04-05 DIAGNOSIS — C787 Secondary malignant neoplasm of liver and intrahepatic bile duct: Secondary | ICD-10-CM | POA: Diagnosis not present

## 2023-04-05 DIAGNOSIS — Z5189 Encounter for other specified aftercare: Secondary | ICD-10-CM | POA: Diagnosis not present

## 2023-04-05 DIAGNOSIS — C786 Secondary malignant neoplasm of retroperitoneum and peritoneum: Secondary | ICD-10-CM | POA: Insufficient documentation

## 2023-04-05 DIAGNOSIS — Z5111 Encounter for antineoplastic chemotherapy: Secondary | ICD-10-CM | POA: Diagnosis not present

## 2023-04-05 DIAGNOSIS — C7800 Secondary malignant neoplasm of unspecified lung: Secondary | ICD-10-CM

## 2023-04-05 MED ORDER — FILGRASTIM-SNDZ 480 MCG/0.8ML IJ SOSY
480.0000 ug | PREFILLED_SYRINGE | Freq: Once | INTRAMUSCULAR | Status: AC
  Administered 2023-04-05: 480 ug via SUBCUTANEOUS
  Filled 2023-04-05: qty 0.8

## 2023-04-05 NOTE — Patient Instructions (Signed)
Filgrastim Injection What is this medication? FILGRASTIM (fil GRA stim) lowers the risk of infection in people who are receiving chemotherapy. It works by helping your body make more white blood cells, which protects your body from infection. It may also be used to help people who have been exposed to high doses of radiation. It can be used to help prepare your body before a stem cell transplant. It works by helping your bone marrow make and release stem cells into the blood. This medicine may be used for other purposes; ask your health care provider or pharmacist if you have questions. COMMON BRAND NAME(S): Neupogen, Nivestym, Releuko, Zarxio What should I tell my care team before I take this medication? They need to know if you have any of these conditions: History of blood diseases, such as sickle cell anemia Kidney disease Recent or ongoing radiation An unusual or allergic reaction to filgrastim, pegfilgrastim, latex, rubber, other medications, foods, dyes, or preservatives Pregnant or trying to get pregnant Breast-feeding How should I use this medication? This medication is injected under the skin or into a vein. It is usually given by your care team in a hospital or clinic setting. It may be given at home. If you get this medication at home, you will be taught how to prepare and give it. Use exactly as directed. Take it as directed on the prescription label at the same time every day. Keep taking it unless your care team tells you to stop. It is important that you put your used needles and syringes in a special sharps container. Do not put them in a trash can. If you do not have a sharps container, call your pharmacist or care team to get one. This medication comes with INSTRUCTIONS FOR USE. Ask your pharmacist for directions on how to use this medication. Read the information carefully. Talk to your pharmacist or care team if you have questions. Talk to your care team about the use of this  medication in children. While it may be prescribed for children for selected conditions, precautions do apply. Overdosage: If you think you have taken too much of this medicine contact a poison control center or emergency room at once. NOTE: This medicine is only for you. Do not share this medicine with others. What if I miss a dose? It is important not to miss any doses. Talk to your care team about what to do if you miss a dose. What may interact with this medication? Medications that may cause a release of neutrophils, such as lithium This list may not describe all possible interactions. Give your health care provider a list of all the medicines, herbs, non-prescription drugs, or dietary supplements you use. Also tell them if you smoke, drink alcohol, or use illegal drugs. Some items may interact with your medicine. What should I watch for while using this medication? Your condition will be monitored carefully while you are receiving this medication. You may need bloodwork while taking this medication. Talk to your care team about your risk of cancer. You may be more at risk for certain types of cancer if you take this medication. What side effects may I notice from receiving this medication? Side effects that you should report to your care team as soon as possible: Allergic reactions--skin rash, itching, hives, swelling of the face, lips, tongue, or throat Capillary leak syndrome--stomach or muscle pain, unusual weakness or fatigue, feeling faint or lightheaded, decrease in the amount of urine, swelling of the ankles, hands, or   feet, trouble breathing High white blood cell level--fever, fatigue, trouble breathing, night sweats, change in vision, weight loss Inflammation of the aorta--fever, fatigue, back, chest, or stomach pain, severe headache Kidney injury (glomerulonephritis)--decrease in the amount of urine, red or dark brown urine, foamy or bubbly urine, swelling of the ankles, hands, or  feet Shortness of breath or trouble breathing Spleen injury--pain in upper left stomach or shoulder Unusual bruising or bleeding Side effects that usually do not require medical attention (report to your care team if they continue or are bothersome): Back pain Bone pain Fatigue Fever Headache Nausea This list may not describe all possible side effects. Call your doctor for medical advice about side effects. You may report side effects to FDA at 1-800-FDA-1088. Where should I keep my medication? Keep out of the reach of children and pets. Keep this medication in the original packaging until you are ready to take it. Protect from light. See product for storage information. Each product may have different instructions. Get rid of any unused medication after the expiration date. To get rid of medications that are no longer needed or have expired: Take the medication to a medications take-back program. Check with your pharmacy or law enforcement to find a location. If you cannot return the medication, ask your pharmacist or care team how to get rid of this medication safely. NOTE: This sheet is a summary. It may not cover all possible information. If you have questions about this medicine, talk to your doctor, pharmacist, or health care provider.  2024 Elsevier/Gold Standard (2021-10-09 00:00:00)  

## 2023-04-05 NOTE — Telephone Encounter (Signed)
LF 10/1; LV 08/23; NV 12/19

## 2023-04-06 ENCOUNTER — Inpatient Hospital Stay: Payer: 59

## 2023-04-06 VITALS — BP 128/78 | HR 97 | Temp 97.8°F | Resp 18 | Wt 155.0 lb

## 2023-04-06 DIAGNOSIS — C7971 Secondary malignant neoplasm of right adrenal gland: Secondary | ICD-10-CM | POA: Diagnosis not present

## 2023-04-06 DIAGNOSIS — C7801 Secondary malignant neoplasm of right lung: Secondary | ICD-10-CM | POA: Diagnosis not present

## 2023-04-06 DIAGNOSIS — C182 Malignant neoplasm of ascending colon: Secondary | ICD-10-CM

## 2023-04-06 DIAGNOSIS — C7802 Secondary malignant neoplasm of left lung: Secondary | ICD-10-CM | POA: Diagnosis not present

## 2023-04-06 DIAGNOSIS — C787 Secondary malignant neoplasm of liver and intrahepatic bile duct: Secondary | ICD-10-CM | POA: Diagnosis not present

## 2023-04-06 DIAGNOSIS — Z803 Family history of malignant neoplasm of breast: Secondary | ICD-10-CM | POA: Diagnosis not present

## 2023-04-06 DIAGNOSIS — Z5189 Encounter for other specified aftercare: Secondary | ICD-10-CM | POA: Diagnosis not present

## 2023-04-06 DIAGNOSIS — C786 Secondary malignant neoplasm of retroperitoneum and peritoneum: Secondary | ICD-10-CM | POA: Diagnosis not present

## 2023-04-06 DIAGNOSIS — C189 Malignant neoplasm of colon, unspecified: Secondary | ICD-10-CM | POA: Diagnosis not present

## 2023-04-06 DIAGNOSIS — C7931 Secondary malignant neoplasm of brain: Secondary | ICD-10-CM | POA: Diagnosis not present

## 2023-04-06 DIAGNOSIS — Z5111 Encounter for antineoplastic chemotherapy: Secondary | ICD-10-CM | POA: Diagnosis not present

## 2023-04-06 MED ORDER — FILGRASTIM-SNDZ 480 MCG/0.8ML IJ SOSY
480.0000 ug | PREFILLED_SYRINGE | Freq: Once | INTRAMUSCULAR | Status: AC
Start: 2023-04-06 — End: 2023-04-06
  Administered 2023-04-06: 480 ug via SUBCUTANEOUS
  Filled 2023-04-06: qty 0.8

## 2023-04-06 NOTE — Patient Instructions (Signed)
Filgrastim Injection What is this medication? FILGRASTIM (fil GRA stim) lowers the risk of infection in people who are receiving chemotherapy. It works by helping your body make more white blood cells, which protects your body from infection. It may also be used to help people who have been exposed to high doses of radiation. It can be used to help prepare your body before a stem cell transplant. It works by helping your bone marrow make and release stem cells into the blood. This medicine may be used for other purposes; ask your health care provider or pharmacist if you have questions. COMMON BRAND NAME(S): Neupogen, Nivestym, Releuko, Zarxio What should I tell my care team before I take this medication? They need to know if you have any of these conditions: History of blood diseases, such as sickle cell anemia Kidney disease Recent or ongoing radiation An unusual or allergic reaction to filgrastim, pegfilgrastim, latex, rubber, other medications, foods, dyes, or preservatives Pregnant or trying to get pregnant Breast-feeding How should I use this medication? This medication is injected under the skin or into a vein. It is usually given by your care team in a hospital or clinic setting. It may be given at home. If you get this medication at home, you will be taught how to prepare and give it. Use exactly as directed. Take it as directed on the prescription label at the same time every day. Keep taking it unless your care team tells you to stop. It is important that you put your used needles and syringes in a special sharps container. Do not put them in a trash can. If you do not have a sharps container, call your pharmacist or care team to get one. This medication comes with INSTRUCTIONS FOR USE. Ask your pharmacist for directions on how to use this medication. Read the information carefully. Talk to your pharmacist or care team if you have questions. Talk to your care team about the use of this  medication in children. While it may be prescribed for children for selected conditions, precautions do apply. Overdosage: If you think you have taken too much of this medicine contact a poison control center or emergency room at once. NOTE: This medicine is only for you. Do not share this medicine with others. What if I miss a dose? It is important not to miss any doses. Talk to your care team about what to do if you miss a dose. What may interact with this medication? Medications that may cause a release of neutrophils, such as lithium This list may not describe all possible interactions. Give your health care provider a list of all the medicines, herbs, non-prescription drugs, or dietary supplements you use. Also tell them if you smoke, drink alcohol, or use illegal drugs. Some items may interact with your medicine. What should I watch for while using this medication? Your condition will be monitored carefully while you are receiving this medication. You may need bloodwork while taking this medication. Talk to your care team about your risk of cancer. You may be more at risk for certain types of cancer if you take this medication. What side effects may I notice from receiving this medication? Side effects that you should report to your care team as soon as possible: Allergic reactions--skin rash, itching, hives, swelling of the face, lips, tongue, or throat Capillary leak syndrome--stomach or muscle pain, unusual weakness or fatigue, feeling faint or lightheaded, decrease in the amount of urine, swelling of the ankles, hands, or   feet, trouble breathing High white blood cell level--fever, fatigue, trouble breathing, night sweats, change in vision, weight loss Inflammation of the aorta--fever, fatigue, back, chest, or stomach pain, severe headache Kidney injury (glomerulonephritis)--decrease in the amount of urine, red or dark brown urine, foamy or bubbly urine, swelling of the ankles, hands, or  feet Shortness of breath or trouble breathing Spleen injury--pain in upper left stomach or shoulder Unusual bruising or bleeding Side effects that usually do not require medical attention (report to your care team if they continue or are bothersome): Back pain Bone pain Fatigue Fever Headache Nausea This list may not describe all possible side effects. Call your doctor for medical advice about side effects. You may report side effects to FDA at 1-800-FDA-1088. Where should I keep my medication? Keep out of the reach of children and pets. Keep this medication in the original packaging until you are ready to take it. Protect from light. See product for storage information. Each product may have different instructions. Get rid of any unused medication after the expiration date. To get rid of medications that are no longer needed or have expired: Take the medication to a medications take-back program. Check with your pharmacy or law enforcement to find a location. If you cannot return the medication, ask your pharmacist or care team how to get rid of this medication safely. NOTE: This sheet is a summary. It may not cover all possible information. If you have questions about this medicine, talk to your doctor, pharmacist, or health care provider.  2024 Elsevier/Gold Standard (2021-10-09 00:00:00)  

## 2023-04-07 ENCOUNTER — Inpatient Hospital Stay: Payer: 59

## 2023-04-07 ENCOUNTER — Encounter: Payer: Self-pay | Admitting: Oncology

## 2023-04-07 VITALS — BP 112/72 | HR 100 | Temp 98.0°F | Resp 18 | Ht 68.0 in

## 2023-04-07 DIAGNOSIS — C189 Malignant neoplasm of colon, unspecified: Secondary | ICD-10-CM | POA: Diagnosis not present

## 2023-04-07 DIAGNOSIS — Z5189 Encounter for other specified aftercare: Secondary | ICD-10-CM | POA: Diagnosis not present

## 2023-04-07 DIAGNOSIS — C787 Secondary malignant neoplasm of liver and intrahepatic bile duct: Secondary | ICD-10-CM | POA: Diagnosis not present

## 2023-04-07 DIAGNOSIS — C786 Secondary malignant neoplasm of retroperitoneum and peritoneum: Secondary | ICD-10-CM | POA: Diagnosis not present

## 2023-04-07 DIAGNOSIS — C7931 Secondary malignant neoplasm of brain: Secondary | ICD-10-CM | POA: Diagnosis not present

## 2023-04-07 DIAGNOSIS — C182 Malignant neoplasm of ascending colon: Secondary | ICD-10-CM

## 2023-04-07 DIAGNOSIS — Z5111 Encounter for antineoplastic chemotherapy: Secondary | ICD-10-CM | POA: Diagnosis not present

## 2023-04-07 DIAGNOSIS — C7801 Secondary malignant neoplasm of right lung: Secondary | ICD-10-CM | POA: Diagnosis not present

## 2023-04-07 DIAGNOSIS — C7971 Secondary malignant neoplasm of right adrenal gland: Secondary | ICD-10-CM | POA: Diagnosis not present

## 2023-04-07 DIAGNOSIS — Z803 Family history of malignant neoplasm of breast: Secondary | ICD-10-CM | POA: Diagnosis not present

## 2023-04-07 DIAGNOSIS — C7802 Secondary malignant neoplasm of left lung: Secondary | ICD-10-CM | POA: Diagnosis not present

## 2023-04-07 MED ORDER — FILGRASTIM-SNDZ 480 MCG/0.8ML IJ SOSY
480.0000 ug | PREFILLED_SYRINGE | Freq: Once | INTRAMUSCULAR | Status: AC
  Administered 2023-04-07: 480 ug via SUBCUTANEOUS
  Filled 2023-04-07: qty 0.8

## 2023-04-07 NOTE — Patient Instructions (Signed)
Filgrastim Injection What is this medication? FILGRASTIM (fil GRA stim) lowers the risk of infection in people who are receiving chemotherapy. It works by helping your body make more white blood cells, which protects your body from infection. It may also be used to help people who have been exposed to high doses of radiation. It can be used to help prepare your body before a stem cell transplant. It works by helping your bone marrow make and release stem cells into the blood. This medicine may be used for other purposes; ask your health care provider or pharmacist if you have questions. COMMON BRAND NAME(S): Neupogen, Nivestym, Releuko, Zarxio What should I tell my care team before I take this medication? They need to know if you have any of these conditions: History of blood diseases, such as sickle cell anemia Kidney disease Recent or ongoing radiation An unusual or allergic reaction to filgrastim, pegfilgrastim, latex, rubber, other medications, foods, dyes, or preservatives Pregnant or trying to get pregnant Breast-feeding How should I use this medication? This medication is injected under the skin or into a vein. It is usually given by your care team in a hospital or clinic setting. It may be given at home. If you get this medication at home, you will be taught how to prepare and give it. Use exactly as directed. Take it as directed on the prescription label at the same time every day. Keep taking it unless your care team tells you to stop. It is important that you put your used needles and syringes in a special sharps container. Do not put them in a trash can. If you do not have a sharps container, call your pharmacist or care team to get one. This medication comes with INSTRUCTIONS FOR USE. Ask your pharmacist for directions on how to use this medication. Read the information carefully. Talk to your pharmacist or care team if you have questions. Talk to your care team about the use of this  medication in children. While it may be prescribed for children for selected conditions, precautions do apply. Overdosage: If you think you have taken too much of this medicine contact a poison control center or emergency room at once. NOTE: This medicine is only for you. Do not share this medicine with others. What if I miss a dose? It is important not to miss any doses. Talk to your care team about what to do if you miss a dose. What may interact with this medication? Medications that may cause a release of neutrophils, such as lithium This list may not describe all possible interactions. Give your health care provider a list of all the medicines, herbs, non-prescription drugs, or dietary supplements you use. Also tell them if you smoke, drink alcohol, or use illegal drugs. Some items may interact with your medicine. What should I watch for while using this medication? Your condition will be monitored carefully while you are receiving this medication. You may need bloodwork while taking this medication. Talk to your care team about your risk of cancer. You may be more at risk for certain types of cancer if you take this medication. What side effects may I notice from receiving this medication? Side effects that you should report to your care team as soon as possible: Allergic reactions--skin rash, itching, hives, swelling of the face, lips, tongue, or throat Capillary leak syndrome--stomach or muscle pain, unusual weakness or fatigue, feeling faint or lightheaded, decrease in the amount of urine, swelling of the ankles, hands, or   feet, trouble breathing High white blood cell level--fever, fatigue, trouble breathing, night sweats, change in vision, weight loss Inflammation of the aorta--fever, fatigue, back, chest, or stomach pain, severe headache Kidney injury (glomerulonephritis)--decrease in the amount of urine, red or dark brown urine, foamy or bubbly urine, swelling of the ankles, hands, or  feet Shortness of breath or trouble breathing Spleen injury--pain in upper left stomach or shoulder Unusual bruising or bleeding Side effects that usually do not require medical attention (report to your care team if they continue or are bothersome): Back pain Bone pain Fatigue Fever Headache Nausea This list may not describe all possible side effects. Call your doctor for medical advice about side effects. You may report side effects to FDA at 1-800-FDA-1088. Where should I keep my medication? Keep out of the reach of children and pets. Keep this medication in the original packaging until you are ready to take it. Protect from light. See product for storage information. Each product may have different instructions. Get rid of any unused medication after the expiration date. To get rid of medications that are no longer needed or have expired: Take the medication to a medications take-back program. Check with your pharmacy or law enforcement to find a location. If you cannot return the medication, ask your pharmacist or care team how to get rid of this medication safely. NOTE: This sheet is a summary. It may not cover all possible information. If you have questions about this medicine, talk to your doctor, pharmacist, or health care provider.  2024 Elsevier/Gold Standard (2021-10-09 00:00:00)  

## 2023-04-08 ENCOUNTER — Inpatient Hospital Stay: Payer: 59 | Admitting: Internal Medicine

## 2023-04-08 VITALS — BP 109/74 | HR 93 | Temp 97.7°F | Resp 18 | Wt 156.1 lb

## 2023-04-08 DIAGNOSIS — C7931 Secondary malignant neoplasm of brain: Secondary | ICD-10-CM

## 2023-04-08 DIAGNOSIS — Z5111 Encounter for antineoplastic chemotherapy: Secondary | ICD-10-CM | POA: Diagnosis not present

## 2023-04-08 DIAGNOSIS — Z5189 Encounter for other specified aftercare: Secondary | ICD-10-CM | POA: Diagnosis not present

## 2023-04-08 DIAGNOSIS — C787 Secondary malignant neoplasm of liver and intrahepatic bile duct: Secondary | ICD-10-CM | POA: Diagnosis not present

## 2023-04-08 DIAGNOSIS — C7801 Secondary malignant neoplasm of right lung: Secondary | ICD-10-CM | POA: Diagnosis not present

## 2023-04-08 DIAGNOSIS — R42 Dizziness and giddiness: Secondary | ICD-10-CM | POA: Diagnosis not present

## 2023-04-08 DIAGNOSIS — C189 Malignant neoplasm of colon, unspecified: Secondary | ICD-10-CM | POA: Diagnosis not present

## 2023-04-08 DIAGNOSIS — C786 Secondary malignant neoplasm of retroperitoneum and peritoneum: Secondary | ICD-10-CM | POA: Diagnosis not present

## 2023-04-08 DIAGNOSIS — Z803 Family history of malignant neoplasm of breast: Secondary | ICD-10-CM | POA: Diagnosis not present

## 2023-04-08 DIAGNOSIS — C7971 Secondary malignant neoplasm of right adrenal gland: Secondary | ICD-10-CM | POA: Diagnosis not present

## 2023-04-08 DIAGNOSIS — C7802 Secondary malignant neoplasm of left lung: Secondary | ICD-10-CM | POA: Diagnosis not present

## 2023-04-08 NOTE — Progress Notes (Signed)
[facility] [address]  [place], [place] [address] [phone]   Interval Evaluation  Date of Service: 04/08/23 Patient Name: Shannon [name] Patient MRN: [id number] Patient DOB: 11-Jan-1966 Provider: Henreitta Leber, MD  Identifying Statement:  Shannon [name] is a 57 y.o. female with Malignant neoplasm metastatic to brain [facility]) [C79.31]   Primary Cancer: Colon Adenocarcinoma, Stage IV  Oncologic History: 05/29/20: Sub-occipital craniotomy, resection by Dr. Wynetta Emery.  Path c/w colon adenocarcinoma 07/08/20: Completes fractionated SRS to resection cavity w/ Dr. Basilio Cairo 10/22/20: Salvage SRS R cerebellum  Interval History:  Shannon [name] presents today for follow up after recent MRI brain.  No new or progressive deficits.  Fatigue is still present. FOLFOX has been ongoing with Dr. Melvyn Neth since April.  Balance is still "not great" but still walking on her own, maintains full independence.  She continues to have issues with sleep, brain fog.  No other changes to medications.  H+P (07/02/20) Patient presented to medical attention at the end of December, 2021 with several weeks of progressive balance impairment.  She worsened toward the end to the point of needing full assist with walking, or being mostly bed-bound.  At the same time she developed bouts of vertigo, described as "room spinning around me", for which medical interventions were not effective.  CNS imaging demonstrated large enhancing mass in left cerebellar hemisphere with swelling and compression c/w likely brain metastasis; she underwent craniotomy and resection on 05/29/20 with Dr. Wynetta Emery.  Following surgery, her symptoms improved considerably.  At present, she is functionally intact aside from issues with short term memory, fatigue, and mood.  Visited with radiation oncology today for post-operative simulation.   Medications: Current Outpatient Medications on File  Prior to Visit  Medication Sig Dispense Refill   acetaminophen (TYLENOL) 325 MG tablet Take 2 tablets (650 mg total) by mouth every 6 (six) hours as needed for mild pain.     atorvastatin (LIPITOR) 10 MG tablet Take by mouth.     buPROPion (WELLBUTRIN XL) 150 MG 24 hr tablet TAKE 3 TABLETS BY MOUTH EVERY MORNING. 90 tablet 1   cariprazine (VRAYLAR) 3 MG capsule Take 1 capsule (3 mg total) by mouth daily. 90 capsule 0   CONTOUR NEXT TEST test strip 3 (three) times daily.     Dulaglutide (TRULICITY) 3 MG/0.5ML SOAJ Inject 3 mg into the skin once a week. 6 mL 1   Dulaglutide (TRULICITY) 3 MG/0.5ML SOPN Inject 3 mg into the skin once weekly. 6 mL 1   esomeprazole (NEXIUM) 40 MG capsule Take 40 mg by mouth daily at 12 noon.     fluvoxaMINE (LUVOX) 100 MG tablet TAKE 3 TABLETS (300 MG TOTAL) BY MOUTH AT BEDTIME 90 tablet 1   hyoscyamine (LEVSIN SL) 0.125 MG SL tablet Place 1 tablet (0.125 mg total) under the tongue every 6 (six) hours as needed. 45 tablet 1   insulin aspart (NOVOLOG FLEXPEN) 100 UNIT/ML FlexPen Inject 8 Units into the skin 3 (three) times daily with meals. 15 mL 11   insulin glargine (LANTUS) 100 UNIT/ML Solostar Pen Inject 26 Units into the skin daily. 15 mL 11   levothyroxine (SYNTHROID) 75 MCG tablet Take 1 tablet (75 mcg total) by mouth daily. 30 tablet 0   loratadine (CLARITIN) 10 MG tablet Take 10 mg by mouth daily.     LORazepam (ATIVAN) 0.5 MG tablet Take 1 tablet (0.5 mg total) by mouth every 8 (eight)  hours as needed for anxiety. 30 tablet 3   magic mouthwash (nystatin, lidocaine, diphenhydrAMINE, alum & mag hydroxide) suspension Swish and spit 5-10 mLs 3 (three) times daily. Do not eat or drink for 1 hour after using. 240 mL 0   magic mouthwash SOLN Take 10 mLs by mouth 3 (three) times daily as needed for mouth pain.     modafinil (PROVIGIL) 200 MG tablet TAKE 1 & 1/2 (ONE & ONE-HALF) TABLETS BY MOUTH ONCE DAILY 135 tablet 1   Multiple Vitamin (MULTI-VITAMIN) tablet Take 1  tablet by mouth daily.     NOVOFINE PEN NEEDLE 32G X 6 MM MISC SMARTSIG:Syringe(s) SUB-Q     omeprazole (PRILOSEC) 20 MG capsule Take 20 mg by mouth daily.     ondansetron (ZOFRAN) 4 MG tablet Take 1 tablet (4 mg total) by mouth every 4 (four) hours as needed for nausea. 90 tablet 3   ondansetron (ZOFRAN-ODT) 4 MG disintegrating tablet Take 1 tablet (4 mg total) by mouth every 4 (four) hours as needed for nausea or vomiting. Dissolve on tongue 90 tablet 3   promethazine (PHENERGAN) 25 MG tablet Take 1 tablet (25 mg total) by mouth every 6 (six) hours as needed for nausea. 30 tablet 3   senna-docusate (SENOKOT-S) 8.6-50 MG tablet Take 2 tablets by mouth at bedtime.     sucralfate (CARAFATE) 1 g tablet Take 1 g by mouth 4 (four) times daily.     traZODone (DESYREL) 50 MG tablet 1-2 TABLETS AT NIGHT AS NEEDED FOR SLEEP 180 tablet 1   TRULICITY 3 MG/0.5ML SOPN Inject into the skin.     zolpidem (AMBIEN) 5 MG tablet Take 1 tablet (5 mg total) by mouth at bedtime as needed for sleep. 30 tablet 3   No current facility-administered medications on file prior to visit.    Allergies:  Allergies  Allergen Reactions   Leucovorin Hives, Rash and Other (See Comments)    Redness on face and neck.  Confirmed that it was the leucovorin causing this on 11/19/21.  We are deleting this from future plan.   Clindamycin/Lincomycin Rash   Irinotecan Rash   Penicillins Rash    Reaction: 10 years   Past Medical History:  Past Medical History:  Diagnosis Date   Anemia    Iron De   Anxiety    Blood clot in vein    Bowel obstruction (HCC)    Colon cancer (HCC) 2018   treated with surgery and chemotherapy   Depression    Diabetes mellitus without complication (HCC)    Type II   DVT (deep venous thrombosis) (HCC) 2019   behind right knee- while she wasa on chemo   GERD (gastroesophageal reflux disease)    History of blood transfusion    History of chemotherapy    History of kidney stones 2012   passed    Hypothyroidism    Obesity    Past Surgical History:  Past Surgical History:  Procedure Laterality Date   ABDOMINAL HYSTERECTOMY  05/2019   total laparoscopin with tubes and ovariers removed also.   APPENDECTOMY  05/2019   BREAST BIOPSY Right 2020   COLON SURGERY  03/2017   Colon resection   COLONOSCOPY  2019   SUBOCCIPITAL CRANIECTOMY CERVICAL LAMINECTOMY Left 05/29/2020   Procedure: Suboccipital Craniectomy for Resection of Left Cerebellar Mass;  Surgeon: Donalee Citrin, MD;  Location: [facility];  Service: Neurosurgery;  Laterality: Left;   Social History:  Social History   Socioeconomic History  Marital status: Widowed    Spouse name: Not on file   Number of children: Not on file   Years of education: Not on file   Highest education level: Not on file  Occupational History   Occupation: care coordinator   Tobacco Use   Smoking status: Never   Smokeless tobacco: Never  Vaping Use   Vaping status: Never Used  Substance and Sexual Activity   Alcohol use: Yes    Comment: rare   Drug use: Never   Sexual activity: Not Currently  Other Topics Concern   Not on file  Social History Narrative   Not on file   Social Determinants of Health   Financial Resource Strain: Not on file  Food Insecurity: Not on file  Transportation Needs: Not on file  Physical Activity: Not on file  Stress: Not on file  Social Connections: Not on file  Intimate Partner Violence: Not on file   Family History:  Family History  Problem Relation Age of Onset   Irritable bowel syndrome Mother    Colon polyps Father    Breast cancer Maternal Grandmother    Diabetes Maternal Grandmother    Diabetes Maternal Grandfather    Breast cancer Paternal Grandmother    Colon polyps Maternal Uncle    Stomach cancer Neg Hx    Pancreatic cancer Neg Hx    Esophageal cancer Neg Hx    Colon cancer Neg Hx     Review of Systems: Constitutional: +Fatigue Eyes: Doesn't report blurriness of vision Ears, nose,  mouth, throat, and face: Doesn't report sore throat Respiratory: Doesn't report cough, dyspnea or wheezes Cardiovascular: Doesn't report palpitation, chest discomfort  Gastrointestinal:  Doesn't report nausea, constipation, diarrhea GU: Doesn't report incontinence Skin: Doesn't report skin rashes Neurological: Per HPI Musculoskeletal: Doesn't report joint pain Behavioral/Psych: +mood issues, depression  Physical Exam: Vitals:   04/08/23 1240  BP: 109/74  Pulse: 93  Resp: 18  Temp: 97.7 F (36.5 C)  SpO2: 98%   KPS: 90. General: Alert, cooperative, pleasant, in no acute distress Head: Normal EENT: No conjunctival injection or scleral icterus.  Lungs: Resp effort normal Cardiac: Regular rate Abdomen: Non-distended abdomen Skin: No rashes cyanosis or petechiae. Extremities: No clubbing or edema  Neurologic Exam: Mental Status: Awake, alert, attentive to examiner. Oriented to self and environment. Language is fluent with intact comprehension.  Cranial Nerves: Visual acuity is grossly normal. Visual fields are full. Extra-ocular movements intact. No ptosis. Face is symmetric Motor: Tone and bulk are normal. Power is full in both arms and legs. Reflexes are symmetric, no pathologic reflexes present.  Sensory: Intact to light touch Gait: Normal.   Labs: I have reviewed the data as listed    Component Value Date/Time   NA 142 03/29/2023 1441   NA 138 03/15/2023 0000   K 4.4 03/29/2023 1441   CL 104 03/29/2023 1441   CO2 27 03/29/2023 1441   GLUCOSE 146 (H) 03/29/2023 1441   BUN 21 (H) 03/29/2023 1441   BUN 16 03/15/2023 0000   CREATININE 1.59 (H) 03/29/2023 1441   CALCIUM 9.9 03/29/2023 1441   PROT 6.7 03/29/2023 1441   ALBUMIN 3.8 03/29/2023 1441   AST 24 03/29/2023 1441   ALT 22 03/29/2023 1441   ALKPHOS 126 03/29/2023 1441   BILITOT 0.7 03/29/2023 1441   GFRNONAA 38 (L) 03/29/2023 1441   GFRNONAA 52 10/23/2022 0000   Lab Results  Component Value Date   WBC  3.6 03/29/2023   NEUTROABS 1.80 03/29/2023  HGB 12.3 03/29/2023   HCT 37 03/29/2023   MCV 101 (A) 01/04/2023   PLT 52 (A) 03/29/2023   Imaging:  [facility] Clinician Interpretation: I have personally reviewed the CNS images as listed.  My interpretation, in the context of the patient's clinical presentation, is stable disease pending official read  No results found.   Assessment/Plan Malignant neoplasm metastatic to brain [facility]) [C79.31]   Shannon [name] is clinically stable today.  MRI brain demonstrates stable findings, pending official read.  There is mild increase in enhancement adjacent to resection cavity but without mass effect, may be c/w ongoing cavity collapse.    We appreciate the opportunity to participate in the care of Shannon [name].  She will continue to follow with Dr. Melvyn Neth for systemic therapy.  We ask that Shannon [name] return to clinic in 6 months with brain MRI, or sooner as needed.  All questions were answered. The patient knows to call the clinic with any problems, questions or concerns. No barriers to learning were detected.  The total time spent in the encounter was 40 minutes and more than 50% was on counseling and review of test results   Henreitta Leber, MD Medical Director of Neuro-Oncology [facility] at [facility] 04/08/23 12:35 PM

## 2023-04-09 ENCOUNTER — Other Ambulatory Visit: Payer: Self-pay | Admitting: Radiation Therapy

## 2023-04-09 ENCOUNTER — Inpatient Hospital Stay: Payer: 59

## 2023-04-11 ENCOUNTER — Other Ambulatory Visit: Payer: Self-pay

## 2023-04-12 NOTE — Progress Notes (Unsigned)
[facility]  [address] [place],  [place]  [address] [phone]  Clinic Day:  04/13/2023  Referring physician: Alinda Deem, MD  HISTORY OF PRESENT ILLNESS:  The patient is a 57 y.o. female with  metastatic colon cancer, which includes spread of disease to her abdominal cavity, lungs, brain and right adrenal gland.  She comes in today to  be evaluated before heading into her 16th cycle of  FOLFOX/Avastin.  The patient claims to have tolerated her 15th cycle of FOLFOX/Avastin fairly well.  However, she has had some mild defects in her visual field, which she has intermittently had in the past.  Of note, she had a brain MRI within the past week, which showed no evidence of CNS recurrence.  Of note, her oxaliplatin dose was reduced with her 12th cycle of chemotherapy due to the increased numbness in her fingers.   With respect to her colon cancer history, she is status post a right hemicolectomy in early October 2018, followed by 12 cycles of adjuvant FOLFOX chemotherapy, which were completed in April 2019.  In December 2021, she underwent a left cerebellar metastasectomy, whose pathology was consistent with metastatic colon cancer.  Tumor testing did come back MMR normal. CT scans revealed evidence of her cancer being in multiple locations, for which she took 40 cycles of FOLFIRI/Avastin.  As follow-up scans revealed liver metastasis, she was placed back on FOLFOX/Avastin, for which she continues to take.  PHYSICAL EXAM:  Blood pressure 108/74, pulse (!) 106, temperature 98.6 F (37 C), resp. rate 14, height 5\' 8"  (1.727 m), weight 157 lb 3.2 oz (71.3 kg), last menstrual period 02/22/2019, SpO2 98%. Body mass index is 23.9 kg/m.  Performance status (ECOG): 1 - Symptomatic but completely ambulatory  Physical Exam Vitals and nursing note reviewed.  Constitutional:      General: She is not in acute distress.    Appearance: Normal appearance.  HENT:      Head: Normocephalic and atraumatic.     Mouth/Throat:     Mouth: Mucous membranes are moist.     Pharynx: Oropharynx is clear. No oropharyngeal exudate or posterior oropharyngeal erythema.  Eyes:     General: No scleral icterus.    Extraocular Movements: Extraocular movements intact.     Conjunctiva/sclera: Conjunctivae normal.     Pupils: Pupils are equal, round, and reactive to light.  Cardiovascular:     Rate and Rhythm: Normal rate and regular rhythm.     Heart sounds: Normal heart sounds. No murmur heard.    No friction rub. No gallop.  Pulmonary:     Effort: Pulmonary effort is normal.     Breath sounds: Normal breath sounds. No wheezing, rhonchi or rales.  Abdominal:     General: There is no distension.     Palpations: Abdomen is soft. There is no hepatomegaly, splenomegaly or mass.     Tenderness: There is no abdominal tenderness.  Musculoskeletal:        General: Normal range of motion.     Cervical back: Normal range of motion and neck supple. No tenderness.     Right lower leg: No edema.     Left lower leg: No edema.  Lymphadenopathy:     Cervical: No cervical adenopathy.     Upper Body:     Right upper body: No supraclavicular or axillary adenopathy.     Left upper body: No supraclavicular or axillary adenopathy.     Lower Body: No  right inguinal adenopathy. No left inguinal adenopathy.  Skin:    General: Skin is warm and dry.     Coloration: Skin is not jaundiced.     Findings: No rash.  Neurological:     Mental Status: She is alert and oriented to person, place, and time.     Cranial Nerves: No cranial nerve deficit.  Psychiatric:        Mood and Affect: Mood normal.        Behavior: Behavior normal.        Thought Content: Thought content normal.     LABS:      Latest Ref Rng & Units 04/13/2023   12:12 PM 03/29/2023   12:00 AM 03/15/2023   12:00 AM  CBC  WBC 4.0 - 10.5 K/uL 2.6  3.6     2.5      Hemoglobin 12.0 - 15.0 g/dL 40.9  81.1     91.4       Hematocrit 36.0 - 46.0 % 34.9  37     38      Platelets 150 - 400 K/uL 59  52     48         This result is from an external source.      Latest Ref Rng & Units 04/13/2023   12:12 PM 03/29/2023    2:41 PM 03/15/2023   12:00 AM  CMP  Glucose 70 - 99 mg/dL 86  782    BUN 6 - 20 mg/dL 14  21  16       Creatinine 0.44 - 1.00 mg/dL 9.56  2.13  1.4      Sodium 135 - 145 mmol/L 144  142  138      Potassium 3.5 - 5.1 mmol/L 3.8  4.4  4.1      Chloride 98 - 111 mmol/L 106  104  101      CO2 22 - 32 mmol/L 26  27  37      Calcium 8.9 - 10.3 mg/dL 9.7  9.9  9.8      Total Protein 6.5 - 8.1 g/dL 6.1  6.7    Total Bilirubin <1.2 mg/dL 0.4  0.7    Alkaline Phos 38 - 126 U/L 171  126  136      AST 15 - 41 U/L 37  24  41      ALT 0 - 44 U/L 45  22  29         This result is from an external source.   ASSESSMENT & PLAN:  Assessment/Plan:  A 57 y.o. female with metastatic colon cancer.  Despite her low white cells and platelets, she will proceed with her 16th cycle of FOLFOX/Avastin this week.  Christeen Douglas will be given with this cycle of chemotherapy.   With her progressive cytopenias, I once again explained to the patient that her future cycles of chemotherapy may need to be spaced out to every 3 weeks to give her bone marrow an extra week to recuperate from each successive cycle of treatment.  Otherwise, I will see her back in 2 weeks before she heads into her 17th cycle of FOLFOX/Avastin.  CT scans will be done a day before her next visit to ascertain her new disease baseline after 16 cycle of palliative chemotherapy.  The patient understands all the plans discussed today and is in agreement with them.  Nancy Arvin Kirby Funk, MD

## 2023-04-13 ENCOUNTER — Encounter: Payer: Self-pay | Admitting: Hematology and Oncology

## 2023-04-13 ENCOUNTER — Inpatient Hospital Stay (HOSPITAL_BASED_OUTPATIENT_CLINIC_OR_DEPARTMENT_OTHER): Payer: 59 | Admitting: Oncology

## 2023-04-13 ENCOUNTER — Inpatient Hospital Stay: Payer: 59

## 2023-04-13 ENCOUNTER — Encounter: Payer: Self-pay | Admitting: Oncology

## 2023-04-13 ENCOUNTER — Other Ambulatory Visit: Payer: Self-pay | Admitting: Oncology

## 2023-04-13 VITALS — BP 108/74 | HR 106 | Temp 98.6°F | Resp 14 | Ht 68.0 in | Wt 157.2 lb

## 2023-04-13 DIAGNOSIS — C182 Malignant neoplasm of ascending colon: Secondary | ICD-10-CM

## 2023-04-13 DIAGNOSIS — C7971 Secondary malignant neoplasm of right adrenal gland: Secondary | ICD-10-CM | POA: Diagnosis not present

## 2023-04-13 DIAGNOSIS — C189 Malignant neoplasm of colon, unspecified: Secondary | ICD-10-CM

## 2023-04-13 DIAGNOSIS — Z803 Family history of malignant neoplasm of breast: Secondary | ICD-10-CM | POA: Diagnosis not present

## 2023-04-13 DIAGNOSIS — C7931 Secondary malignant neoplasm of brain: Secondary | ICD-10-CM | POA: Diagnosis not present

## 2023-04-13 DIAGNOSIS — Z5189 Encounter for other specified aftercare: Secondary | ICD-10-CM | POA: Diagnosis not present

## 2023-04-13 DIAGNOSIS — C787 Secondary malignant neoplasm of liver and intrahepatic bile duct: Secondary | ICD-10-CM

## 2023-04-13 DIAGNOSIS — C786 Secondary malignant neoplasm of retroperitoneum and peritoneum: Secondary | ICD-10-CM | POA: Diagnosis not present

## 2023-04-13 DIAGNOSIS — C7802 Secondary malignant neoplasm of left lung: Secondary | ICD-10-CM | POA: Diagnosis not present

## 2023-04-13 DIAGNOSIS — D696 Thrombocytopenia, unspecified: Secondary | ICD-10-CM

## 2023-04-13 DIAGNOSIS — Z5111 Encounter for antineoplastic chemotherapy: Secondary | ICD-10-CM | POA: Diagnosis not present

## 2023-04-13 DIAGNOSIS — C7801 Secondary malignant neoplasm of right lung: Secondary | ICD-10-CM | POA: Diagnosis not present

## 2023-04-13 LAB — CMP (CANCER CENTER ONLY)
ALT: 45 U/L — ABNORMAL HIGH (ref 0–44)
AST: 37 U/L (ref 15–41)
Albumin: 4 g/dL (ref 3.5–5.0)
Alkaline Phosphatase: 171 U/L — ABNORMAL HIGH (ref 38–126)
Anion gap: 12 (ref 5–15)
BUN: 14 mg/dL (ref 6–20)
CO2: 26 mmol/L (ref 22–32)
Calcium: 9.7 mg/dL (ref 8.9–10.3)
Chloride: 106 mmol/L (ref 98–111)
Creatinine: 1.43 mg/dL — ABNORMAL HIGH (ref 0.44–1.00)
GFR, Estimated: 43 mL/min — ABNORMAL LOW (ref >60)
Glucose, Bld: 86 mg/dL (ref 70–99)
Potassium: 3.8 mmol/L (ref 3.5–5.1)
Sodium: 144 mmol/L (ref 135–145)
Total Bilirubin: 0.4 mg/dL (ref <1.2)
Total Protein: 6.1 g/dL — ABNORMAL LOW (ref 6.5–8.1)

## 2023-04-13 LAB — CBC WITH DIFFERENTIAL (CANCER CENTER ONLY)
Abs Immature Granulocytes: 0.01 10*3/uL (ref 0.00–0.07)
Basophils Absolute: 0 10*3/uL (ref 0.0–0.1)
Basophils Relative: 0 %
Eosinophils Absolute: 0 10*3/uL (ref 0.0–0.5)
Eosinophils Relative: 1 %
HCT: 34.9 % — ABNORMAL LOW (ref 36.0–46.0)
Hemoglobin: 12 g/dL (ref 12.0–15.0)
Immature Granulocytes: 0 %
Lymphocytes Relative: 38 %
Lymphs Abs: 1 10*3/uL (ref 0.7–4.0)
MCH: 34.2 pg — ABNORMAL HIGH (ref 26.0–34.0)
MCHC: 34.4 g/dL (ref 30.0–36.0)
MCV: 99.4 fL (ref 80.0–100.0)
Monocytes Absolute: 0.4 10*3/uL (ref 0.1–1.0)
Monocytes Relative: 16 %
Neutro Abs: 1.1 10*3/uL — ABNORMAL LOW (ref 1.7–7.7)
Neutrophils Relative %: 45 %
Platelet Count: 59 10*3/uL — ABNORMAL LOW (ref 150–400)
RBC: 3.51 MIL/uL — ABNORMAL LOW (ref 3.87–5.11)
RDW: 15.1 % (ref 11.5–15.5)
WBC Count: 2.6 10*3/uL — ABNORMAL LOW (ref 4.0–10.5)
nRBC: 0 % (ref 0.0–0.2)
nRBC: 0 /100{WBCs}

## 2023-04-13 LAB — TOTAL PROTEIN, URINE DIPSTICK: Protein, ur: NEGATIVE mg/dL

## 2023-04-13 MED ORDER — FAMOTIDINE IN NACL 20-0.9 MG/50ML-% IV SOLN
20.0000 mg | Freq: Once | INTRAVENOUS | Status: AC
Start: 2023-04-13 — End: 2023-04-13
  Administered 2023-04-13: 20 mg via INTRAVENOUS
  Filled 2023-04-13: qty 50

## 2023-04-13 MED ORDER — HEPARIN SOD (PORK) LOCK FLUSH 100 UNIT/ML IV SOLN
500.0000 [IU] | Freq: Once | INTRAVENOUS | Status: DC | PRN
Start: 2023-04-13 — End: 2023-04-13

## 2023-04-13 MED ORDER — SODIUM CHLORIDE 0.9 % IV SOLN: Freq: Once | INTRAVENOUS | Status: AC

## 2023-04-13 MED ORDER — OXALIPLATIN CHEMO INJECTION 50 MG/10ML
63.7500 mg/m2 | Freq: Once | INTRAVENOUS | Status: AC
  Administered 2023-04-13: 115 mg via INTRAVENOUS
  Filled 2023-04-13: qty 20

## 2023-04-13 MED ORDER — DEXAMETHASONE SODIUM PHOSPHATE 10 MG/ML IJ SOLN
10.0000 mg | Freq: Once | INTRAMUSCULAR | Status: AC
  Administered 2023-04-13: 10 mg via INTRAVENOUS
  Filled 2023-04-13: qty 1

## 2023-04-13 MED ORDER — SODIUM CHLORIDE 0.9 % IV SOLN
150.0000 mg | Freq: Once | INTRAVENOUS | Status: AC
  Administered 2023-04-13: 150 mg via INTRAVENOUS
  Filled 2023-04-13: qty 150

## 2023-04-13 MED ORDER — SODIUM CHLORIDE 0.9 % IV SOLN
2400.0000 mg/m2 | INTRAVENOUS | Status: DC
  Administered 2023-04-13: 4350 mg via INTRAVENOUS
  Filled 2023-04-13: qty 87

## 2023-04-13 MED ORDER — FLUOROURACIL CHEMO INJECTION 2.5 GM/50ML
400.0000 mg/m2 | Freq: Once | INTRAVENOUS | Status: AC
  Administered 2023-04-13: 700 mg via INTRAVENOUS
  Filled 2023-04-13: qty 14

## 2023-04-13 MED ORDER — DEXTROSE 5 % IV SOLN: Freq: Once | INTRAVENOUS | Status: DC

## 2023-04-13 MED ORDER — DIPHENHYDRAMINE HCL 50 MG/ML IJ SOLN
25.0000 mg | Freq: Once | INTRAMUSCULAR | Status: AC
  Administered 2023-04-13: 25 mg via INTRAVENOUS
  Filled 2023-04-13: qty 1

## 2023-04-13 MED ORDER — SODIUM CHLORIDE 0.9% FLUSH: 10.0000 mL | INTRAVENOUS | Status: DC | PRN

## 2023-04-13 MED ORDER — BEVACIZUMAB-AWWB CHEMO INJECTION 400 MG/16ML
5.0000 mg/kg | Freq: Once | INTRAVENOUS | Status: AC
  Administered 2023-04-13: 350 mg via INTRAVENOUS
  Filled 2023-04-13: qty 14

## 2023-04-13 MED ORDER — PALONOSETRON HCL INJECTION 0.25 MG/5ML
0.2500 mg | Freq: Once | INTRAVENOUS | Status: AC
  Administered 2023-04-13: 0.25 mg via INTRAVENOUS
  Filled 2023-04-13: qty 5

## 2023-04-13 NOTE — Progress Notes (Signed)
PER DR LEWIS OK TO TREAT DESPITE PLATELET COUNT OF 59, ANC 1.1 PER DR LEWIS OK TO TREAT DESPITE PULSE RATE OF 106

## 2023-04-13 NOTE — Patient Instructions (Signed)
Bevacizumab Injection What is this medication? BEVACIZUMAB (be va SIZ yoo mab) treats some types of cancer. It works by blocking a protein that causes cancer cells to grow and multiply. This helps to slow or stop the spread of cancer cells. It is a monoclonal antibody. This medicine may be used for other purposes; ask your health care provider or pharmacist if you have questions. COMMON BRAND NAME(S): Alymsys, Avastin, MVASI, Omer Jack What should I tell my care team before I take this medication? They need to know if you have any of these conditions: Blood clots Coughing up blood Having or recent surgery Heart failure High blood pressure History of a connection between 2 or more body parts that do not usually connect (fistula) History of a tear in your stomach or intestines Protein in your urine An unusual or allergic reaction to bevacizumab, other medications, foods, dyes, or preservatives Pregnant or trying to get pregnant Breast-feeding How should I use this medication? This medication is injected into a vein. It is given by your care team in a hospital or clinic setting. Talk to your care team the use of this medication in children. Special care may be needed. Overdosage: If you think you have taken too much of this medicine contact a poison control center or emergency room at once. NOTE: This medicine is only for you. Do not share this medicine with others. What if I miss a dose? Keep appointments for follow-up doses. It is important not to miss your dose. Call your care team if you are unable to keep an appointment. What may interact with this medication? Interactions are not expected. This list may not describe all possible interactions. Give your health care provider a list of all the medicines, herbs, non-prescription drugs, or dietary supplements you use. Also tell them if you smoke, drink alcohol, or use illegal drugs. Some items may interact with your medicine. What should I  watch for while using this medication? Your condition will be monitored carefully while you are receiving this medication. You may need blood work while taking this medication. This medication may make you feel generally unwell. This is not uncommon as chemotherapy can affect healthy cells as well as cancer cells. Report any side effects. Continue your course of treatment even though you feel ill unless your care team tells you to stop. This medication may increase your risk to bruise or bleed. Call your care team if you notice any unusual bleeding. Before having surgery, talk to your care team to make sure it is ok. This medication can increase the risk of poor healing of your surgical site or wound. You will need to stop this medication for 28 days before surgery. After surgery, wait at least 28 days before restarting this medication. Make sure the surgical site or wound is healed enough before restarting this medication. Talk to your care team if questions. Talk to your care team if you may be pregnant. Serious birth defects can occur if you take this medication during pregnancy and for 6 months after the last dose. Contraception is recommended while taking this medication and for 6 months after the last dose. Your care team can help you find the option that works for you. Do not breastfeed while taking this medication and for 6 months after the last dose. This medication can cause infertility. Talk to your care team if you are concerned about your fertility. What side effects may I notice from receiving this medication? Side effects that you should report  to your care team as soon as possible: Allergic reactions--skin rash, itching, hives, swelling of the face, lips, tongue, or throat Bleeding--bloody or black, tar-like stools, vomiting blood or brown material that looks like coffee grounds, red or dark brown urine, small red or purple spots on skin, unusual bruising or bleeding Blood clot--pain,  swelling, or warmth in the leg, shortness of breath, chest pain Heart attack--pain or tightness in the chest, shoulders, arms, or jaw, nausea, shortness of breath, cold or clammy skin, feeling faint or lightheaded Heart failure--shortness of breath, swelling of the ankles, feet, or hands, sudden weight gain, unusual weakness or fatigue Increase in blood pressure Infection--fever, chills, cough, sore throat, wounds that don't heal, pain or trouble when passing urine, general feeling of discomfort or being unwell Infusion reactions--chest pain, shortness of breath or trouble breathing, feeling faint or lightheaded Kidney injury--decrease in the amount of urine, swelling of the ankles, hands, or feet Stomach pain that is severe, does not go away, or gets worse Stroke--sudden numbness or weakness of the face, arm, or leg, trouble speaking, confusion, trouble walking, loss of balance or coordination, dizziness, severe headache, change in vision Sudden and severe headache, confusion, change in vision, seizures, which may be signs of posterior reversible encephalopathy syndrome (PRES) Side effects that usually do not require medical attention (report to your care team if they continue or are bothersome): Back pain Change in taste Diarrhea Dry skin Increased tears Nosebleed This list may not describe all possible side effects. Call your doctor for medical advice about side effects. You may report side effects to FDA at 1-800-FDA-1088. Where should I keep my medication? This medication is given in a hospital or clinic. It will not be stored at home. NOTE: This sheet is a summary. It may not cover all possible information. If you have questions about this medicine, talk to your doctor, pharmacist, or health care provider.  2024 Else[employer]/Gold Standard (2021-10-03 00:00:00) Fluorouracil Injection What is this medication? FLUOROURACIL (flure oh YOOR a sil) treats some types of cancer. It works by slowing  down the growth of cancer cells. This medicine may be used for other purposes; ask your health care provider or pharmacist if you have questions. COMMON BRAND NAME(S): Adrucil What should I tell my care team before I take this medication? They need to know if you have any of these conditions: Blood disorders Dihydropyrimidine dehydrogenase (DPD) deficiency Infection, such as chickenpox, cold sores, herpes Kidney disease Liver disease Poor nutrition Recent or ongoing radiation therapy An unusual or allergic reaction to fluorouracil, other medications, foods, dyes, or preservatives If you or your partner are pregnant or trying to get pregnant Breast-feeding How should I use this medication? This medication is injected into a vein. It is administered by your care team in a hospital or clinic setting. Talk to your care team about the use of this medication in children. Special care may be needed. Overdosage: If you think you have taken too much of this medicine contact a poison control center or emergency room at once. NOTE: This medicine is only for you. Do not share this medicine with others. What if I miss a dose? Keep appointments for follow-up doses. It is important not to miss your dose. Call your care team if you are unable to keep an appointment. What may interact with this medication? Do not take this medication with any of the following: Live virus vaccines This medication may also interact with the following: Medications that treat or prevent blood  clots, such as warfarin, enoxaparin, dalteparin This list may not describe all possible interactions. Give your health care provider a list of all the medicines, herbs, non-prescription drugs, or dietary supplements you use. Also tell them if you smoke, drink alcohol, or use illegal drugs. Some items may interact with your medicine. What should I watch for while using this medication? Your condition will be monitored carefully while you  are receiving this medication. This medication may make you feel generally unwell. This is not uncommon as chemotherapy can affect healthy cells as well as cancer cells. Report any side effects. Continue your course of treatment even though you feel ill unless your care team tells you to stop. In some cases, you may be given additional medications to help with side effects. Follow all directions for their use. This medication may increase your risk of getting an infection. Call your care team for advice if you get a fever, chills, sore throat, or other symptoms of a cold or flu. Do not treat yourself. Try to avoid being around people who are sick. This medication may increase your risk to bruise or bleed. Call your care team if you notice any unusual bleeding. Be careful brushing or flossing your teeth or using a toothpick because you may get an infection or bleed more easily. If you have any dental work done, tell your dentist you are receiving this medication. Avoid taking medications that contain aspirin, acetaminophen, ibuprofen, naproxen, or ketoprofen unless instructed by your care team. These medications may hide a fever. Do not treat diarrhea with over the counter products. Contact your care team if you have diarrhea that lasts more than 2 days or if it is severe and watery. This medication can make you more sensitive to the sun. Keep out of the sun. If you cannot avoid being in the sun, wear protective clothing and sunscreen. Do not use sun lamps, tanning beds, or tanning booths. Talk to your care team if you or your partner wish to become pregnant or think you might be pregnant. This medication can cause serious birth defects if taken during pregnancy and for 3 months after the last dose. A reliable form of contraception is recommended while taking this medication and for 3 months after the last dose. Talk to your care team about effective forms of contraception. Do not father a child while taking  this medication and for 3 months after the last dose. Use a condom while having sex during this time period. Do not breastfeed while taking this medication. This medication may cause infertility. Talk to your care team if you are concerned about your fertility. What side effects may I notice from receiving this medication? Side effects that you should report to your care team as soon as possible: Allergic reactions--skin rash, itching, hives, swelling of the face, lips, tongue, or throat Heart attack--pain or tightness in the chest, shoulders, arms, or jaw, nausea, shortness of breath, cold or clammy skin, feeling faint or lightheaded Heart failure--shortness of breath, swelling of the ankles, feet, or hands, sudden weight gain, unusual weakness or fatigue Heart rhythm changes--fast or irregular heartbeat, dizziness, feeling faint or lightheaded, chest pain, trouble breathing High ammonia level--unusual weakness or fatigue, confusion, loss of appetite, nausea, vomiting, seizures Infection--fever, chills, cough, sore throat, wounds that don't heal, pain or trouble when passing urine, general feeling of discomfort or being unwell Low red blood cell level--unusual weakness or fatigue, dizziness, headache, trouble breathing Pain, tingling, or numbness in the hands or feet,  muscle weakness, change in vision, confusion or trouble speaking, loss of balance or coordination, trouble walking, seizures Redness, swelling, and blistering of the skin over hands and feet Severe or prolonged diarrhea Unusual bruising or bleeding Side effects that usually do not require medical attention (report to your care team if they continue or are bothersome): Dry skin Headache Increased tears Nausea Pain, redness, or swelling with sores inside the mouth or throat Sensitivity to light Vomiting This list may not describe all possible side effects. Call your doctor for medical advice about side effects. You may report  side effects to FDA at 1-800-FDA-1088. Where should I keep my medication? This medication is given in a hospital or clinic. It will not be stored at home. NOTE: This sheet is a summary. It may not cover all possible information. If you have questions about this medicine, talk to your doctor, pharmacist, or health care provider.  2024 [employer]/Gold Standard (2021-09-23 00:00:00) Oxaliplatin Injection What is this medication? OXALIPLATIN (ox AL i PLA tin) treats colorectal cancer. It works by slowing down the growth of cancer cells. This medicine may be used for other purposes; ask your health care provider or pharmacist if you have questions. COMMON BRAND NAME(S): Eloxatin What should I tell my care team before I take this medication? They need to know if you have any of these conditions: Heart disease History of irregular heartbeat or rhythm Liver disease Low blood cell levels (white cells, red cells, and platelets) Lung or breathing disease, such as asthma Take medications that treat or prevent blood clots Tingling of the fingers, toes, or other nerve disorder An unusual or allergic reaction to oxaliplatin, other medications, foods, dyes, or preservatives If you or your partner are pregnant or trying to get pregnant Breast-feeding How should I use this medication? This medication is injected into a vein. It is given by your care team in a hospital or clinic setting. Talk to your care team about the use of this medication in children. Special care may be needed. Overdosage: If you think you have taken too much of this medicine contact a poison control center or emergency room at once. NOTE: This medicine is only for you. Do not share this medicine with others. What if I miss a dose? Keep appointments for follow-up doses. It is important not to miss a dose. Call your care team if you are unable to keep an appointment. What may interact with this medication? Do not take this medication  with any of the following: Cisapride Dronedarone Pimozide Thioridazine This medication may also interact with the following: Aspirin and aspirin-like medications Certain medications that treat or prevent blood clots, such as warfarin, apixaban, dabigatran, and rivaroxaban Cisplatin Cyclosporine Diuretics Medications for infection, such as acyclovir, adefovir, amphotericin B, bacitracin, cidofovir, foscarnet, ganciclovir, gentamicin, pentamidine, vancomycin NSAIDs, medications for pain and inflammation, such as ibuprofen or naproxen Other medications that cause heart rhythm changes Pamidronate Zoledronic acid This list may not describe all possible interactions. Give your health care provider a list of all the medicines, herbs, non-prescription drugs, or dietary supplements you use. Also tell them if you smoke, drink alcohol, or use illegal drugs. Some items may interact with your medicine. What should I watch for while using this medication? Your condition will be monitored carefully while you are receiving this medication. You may need blood work while taking this medication. This medication may make you feel generally unwell. This is not uncommon as chemotherapy can affect healthy cells as well as cancer  cells. Report any side effects. Continue your course of treatment even though you feel ill unless your care team tells you to stop. This medication may increase your risk of getting an infection. Call your care team for advice if you get a fever, chills, sore throat, or other symptoms of a cold or flu. Do not treat yourself. Try to avoid being around people who are sick. Avoid taking medications that contain aspirin, acetaminophen, ibuprofen, naproxen, or ketoprofen unless instructed by your care team. These medications may hide a fever. Be careful brushing or flossing your teeth or using a toothpick because you may get an infection or bleed more easily. If you have any dental work done,  tell your dentist you are receiving this medication. This medication can make you more sensitive to cold. Do not drink cold drinks or use ice. Cover exposed skin before coming in contact with cold temperatures or cold objects. When out in cold weather wear warm clothing and cover your mouth and nose to warm the air that goes into your lungs. Tell your care team if you get sensitive to the cold. Talk to your care team if you or your partner are pregnant or think either of you might be pregnant. This medication can cause serious birth defects if taken during pregnancy and for 9 months after the last dose. A negative pregnancy test is required before starting this medication. A reliable form of contraception is recommended while taking this medication and for 9 months after the last dose. Talk to your care team about effective forms of contraception. Do not father a child while taking this medication and for 6 months after the last dose. Use a condom while having sex during this time period. Do not breastfeed while taking this medication and for 3 months after the last dose. This medication may cause infertility. Talk to your care team if you are concerned about your fertility. What side effects may I notice from receiving this medication? Side effects that you should report to your care team as soon as possible: Allergic reactions--skin rash, itching, hives, swelling of the face, lips, tongue, or throat Bleeding--bloody or black, tar-like stools, vomiting blood or brown material that looks like coffee grounds, red or dark brown urine, small red or purple spots on skin, unusual bruising or bleeding Dry cough, shortness of breath or trouble breathing Heart rhythm changes--fast or irregular heartbeat, dizziness, feeling faint or lightheaded, chest pain, trouble breathing Infection--fever, chills, cough, sore throat, wounds that don't heal, pain or trouble when passing urine, general feeling of discomfort or  being unwell Liver injury--right upper belly pain, loss of appetite, nausea, light-colored stool, dark yellow or brown urine, yellowing skin or eyes, unusual weakness or fatigue Low red blood cell level--unusual weakness or fatigue, dizziness, headache, trouble breathing Muscle injury--unusual weakness or fatigue, muscle pain, dark yellow or brown urine, decrease in amount of urine Pain, tingling, or numbness in the hands or feet Sudden and severe headache, confusion, change in vision, seizures, which may be signs of posterior reversible encephalopathy syndrome (PRES) Unusual bruising or bleeding Side effects that usually do not require medical attention (report to your care team if they continue or are bothersome): Diarrhea Nausea Pain, redness, or swelling with sores inside the mouth or throat Unusual weakness or fatigue Vomiting This list may not describe all possible side effects. Call your doctor for medical advice about side effects. You may report side effects to FDA at 1-800-FDA-1088. Where should I keep my medication? This  medication is given in a hospital or clinic. It will not be stored at home. NOTE: This sheet is a summary. It may not cover all possible information. If you have questions about this medicine, talk to your doctor, pharmacist, or health care provider.  2024 [employer] Standard (2022-03-03 00:00:00)

## 2023-04-15 ENCOUNTER — Other Ambulatory Visit: Payer: Self-pay | Admitting: Psychiatry

## 2023-04-15 ENCOUNTER — Inpatient Hospital Stay: Payer: 59

## 2023-04-15 VITALS — BP 111/76 | HR 99 | Temp 98.4°F | Resp 18 | Ht 68.0 in | Wt 157.0 lb

## 2023-04-15 DIAGNOSIS — F333 Major depressive disorder, recurrent, severe with psychotic symptoms: Secondary | ICD-10-CM

## 2023-04-15 DIAGNOSIS — Z803 Family history of malignant neoplasm of breast: Secondary | ICD-10-CM | POA: Diagnosis not present

## 2023-04-15 DIAGNOSIS — C787 Secondary malignant neoplasm of liver and intrahepatic bile duct: Secondary | ICD-10-CM

## 2023-04-15 DIAGNOSIS — C786 Secondary malignant neoplasm of retroperitoneum and peritoneum: Secondary | ICD-10-CM | POA: Diagnosis not present

## 2023-04-15 DIAGNOSIS — C182 Malignant neoplasm of ascending colon: Secondary | ICD-10-CM

## 2023-04-15 DIAGNOSIS — C189 Malignant neoplasm of colon, unspecified: Secondary | ICD-10-CM

## 2023-04-15 DIAGNOSIS — Z5189 Encounter for other specified aftercare: Secondary | ICD-10-CM | POA: Diagnosis not present

## 2023-04-15 DIAGNOSIS — C7802 Secondary malignant neoplasm of left lung: Secondary | ICD-10-CM | POA: Diagnosis not present

## 2023-04-15 DIAGNOSIS — C7971 Secondary malignant neoplasm of right adrenal gland: Secondary | ICD-10-CM | POA: Diagnosis not present

## 2023-04-15 DIAGNOSIS — C7801 Secondary malignant neoplasm of right lung: Secondary | ICD-10-CM | POA: Diagnosis not present

## 2023-04-15 DIAGNOSIS — G3184 Mild cognitive impairment, so stated: Secondary | ICD-10-CM

## 2023-04-15 DIAGNOSIS — Z5111 Encounter for antineoplastic chemotherapy: Secondary | ICD-10-CM | POA: Diagnosis not present

## 2023-04-15 DIAGNOSIS — C7931 Secondary malignant neoplasm of brain: Secondary | ICD-10-CM | POA: Diagnosis not present

## 2023-04-15 DIAGNOSIS — R530 Neoplastic (malignant) related fatigue: Secondary | ICD-10-CM

## 2023-04-15 MED ORDER — SODIUM CHLORIDE 0.9% FLUSH
10.0000 mL | INTRAVENOUS | Status: DC | PRN
  Administered 2023-04-15: 10 mL

## 2023-04-15 MED ORDER — HEPARIN SOD (PORK) LOCK FLUSH 100 UNIT/ML IV SOLN
500.0000 [IU] | Freq: Once | INTRAVENOUS | Status: AC | PRN
  Administered 2023-04-15: 500 [IU]

## 2023-04-15 NOTE — Progress Notes (Signed)
[facility] Clinical Social Work  Clinical Social Work was referred by nurse for transportation referral. CSW sent referral to Transportation Coordinator C.Vilsaint on this date.   Marguerita Merles, LCSWA Clinical Social Worker [facility]

## 2023-04-19 ENCOUNTER — Inpatient Hospital Stay: Payer: 59

## 2023-04-19 ENCOUNTER — Telehealth: Payer: Self-pay | Admitting: Oncology

## 2023-04-19 VITALS — BP 115/79 | HR 106 | Temp 97.7°F | Resp 18 | Wt 152.7 lb

## 2023-04-19 DIAGNOSIS — C189 Malignant neoplasm of colon, unspecified: Secondary | ICD-10-CM

## 2023-04-19 DIAGNOSIS — C7931 Secondary malignant neoplasm of brain: Secondary | ICD-10-CM | POA: Diagnosis not present

## 2023-04-19 DIAGNOSIS — C787 Secondary malignant neoplasm of liver and intrahepatic bile duct: Secondary | ICD-10-CM | POA: Diagnosis not present

## 2023-04-19 DIAGNOSIS — Z5189 Encounter for other specified aftercare: Secondary | ICD-10-CM | POA: Diagnosis not present

## 2023-04-19 DIAGNOSIS — Z803 Family history of malignant neoplasm of breast: Secondary | ICD-10-CM | POA: Diagnosis not present

## 2023-04-19 DIAGNOSIS — C182 Malignant neoplasm of ascending colon: Secondary | ICD-10-CM

## 2023-04-19 DIAGNOSIS — C786 Secondary malignant neoplasm of retroperitoneum and peritoneum: Secondary | ICD-10-CM | POA: Diagnosis not present

## 2023-04-19 DIAGNOSIS — C7802 Secondary malignant neoplasm of left lung: Secondary | ICD-10-CM | POA: Diagnosis not present

## 2023-04-19 DIAGNOSIS — C7800 Secondary malignant neoplasm of unspecified lung: Secondary | ICD-10-CM

## 2023-04-19 DIAGNOSIS — C7801 Secondary malignant neoplasm of right lung: Secondary | ICD-10-CM | POA: Diagnosis not present

## 2023-04-19 DIAGNOSIS — Z5111 Encounter for antineoplastic chemotherapy: Secondary | ICD-10-CM | POA: Diagnosis not present

## 2023-04-19 DIAGNOSIS — C7971 Secondary malignant neoplasm of right adrenal gland: Secondary | ICD-10-CM | POA: Diagnosis not present

## 2023-04-19 MED ORDER — FILGRASTIM-SNDZ 480 MCG/0.8ML IJ SOSY
480.0000 ug | PREFILLED_SYRINGE | Freq: Once | INTRAMUSCULAR | Status: AC
  Administered 2023-04-19: 480 ug via SUBCUTANEOUS
  Filled 2023-04-19: qty 0.8

## 2023-04-19 NOTE — Patient Instructions (Signed)
Filgrastim Injection What is this medication? FILGRASTIM (fil GRA stim) lowers the risk of infection in people who are receiving chemotherapy. It works by helping your body make more white blood cells, which protects your body from infection. It may also be used to help people who have been exposed to high doses of radiation. It can be used to help prepare your body before a stem cell transplant. It works by helping your bone marrow make and release stem cells into the blood. This medicine may be used for other purposes; ask your health care provider or pharmacist if you have questions. COMMON BRAND NAME(S): Neupogen, Nivestym, Releuko, Zarxio What should I tell my care team before I take this medication? They need to know if you have any of these conditions: History of blood diseases, such as sickle cell anemia Kidney disease Recent or ongoing radiation An unusual or allergic reaction to filgrastim, pegfilgrastim, latex, rubber, other medications, foods, dyes, or preservatives Pregnant or trying to get pregnant Breast-feeding How should I use this medication? This medication is injected under the skin or into a vein. It is usually given by your care team in a hospital or clinic setting. It may be given at home. If you get this medication at home, you will be taught how to prepare and give it. Use exactly as directed. Take it as directed on the prescription label at the same time every day. Keep taking it unless your care team tells you to stop. It is important that you put your used needles and syringes in a special sharps container. Do not put them in a trash can. If you do not have a sharps container, call your pharmacist or care team to get one. This medication comes with INSTRUCTIONS FOR USE. Ask your pharmacist for directions on how to use this medication. Read the information carefully. Talk to your pharmacist or care team if you have questions. Talk to your care team about the use of this  medication in children. While it may be prescribed for children for selected conditions, precautions do apply. Overdosage: If you think you have taken too much of this medicine contact a poison control center or emergency room at once. NOTE: This medicine is only for you. Do not share this medicine with others. What if I miss a dose? It is important not to miss any doses. Talk to your care team about what to do if you miss a dose. What may interact with this medication? Medications that may cause a release of neutrophils, such as lithium This list may not describe all possible interactions. Give your health care provider a list of all the medicines, herbs, non-prescription drugs, or dietary supplements you use. Also tell them if you smoke, drink alcohol, or use illegal drugs. Some items may interact with your medicine. What should I watch for while using this medication? Your condition will be monitored carefully while you are receiving this medication. You may need bloodwork while taking this medication. Talk to your care team about your risk of cancer. You may be more at risk for certain types of cancer if you take this medication. What side effects may I notice from receiving this medication? Side effects that you should report to your care team as soon as possible: Allergic reactions--skin rash, itching, hives, swelling of the face, lips, tongue, or throat Capillary leak syndrome--stomach or muscle pain, unusual weakness or fatigue, feeling faint or lightheaded, decrease in the amount of urine, swelling of the ankles, hands, or   feet, trouble breathing High white blood cell level--fever, fatigue, trouble breathing, night sweats, change in vision, weight loss Inflammation of the aorta--fever, fatigue, back, chest, or stomach pain, severe headache Kidney injury (glomerulonephritis)--decrease in the amount of urine, red or dark brown urine, foamy or bubbly urine, swelling of the ankles, hands, or  feet Shortness of breath or trouble breathing Spleen injury--pain in upper left stomach or shoulder Unusual bruising or bleeding Side effects that usually do not require medical attention (report to your care team if they continue or are bothersome): Back pain Bone pain Fatigue Fever Headache Nausea This list may not describe all possible side effects. Call your doctor for medical advice about side effects. You may report side effects to FDA at 1-800-FDA-1088. Where should I keep my medication? Keep out of the reach of children and pets. Keep this medication in the original packaging until you are ready to take it. Protect from light. See product for storage information. Each product may have different instructions. Get rid of any unused medication after the expiration date. To get rid of medications that are no longer needed or have expired: Take the medication to a medications take-back program. Check with your pharmacy or law enforcement to find a location. If you cannot return the medication, ask your pharmacist or care team how to get rid of this medication safely. NOTE: This sheet is a summary. It may not cover all possible information. If you have questions about this medicine, talk to your doctor, pharmacist, or health care provider.  2024 Elsevier/Gold Standard (2021-10-09 00:00:00)  

## 2023-04-19 NOTE — Telephone Encounter (Signed)
CT C/A/P has been scheduled for 04/23/23 @ 11:30; Check in @ 10:30   Notified pt of date,time and instructions.

## 2023-04-20 ENCOUNTER — Inpatient Hospital Stay: Payer: 59

## 2023-04-20 VITALS — BP 112/75 | HR 105 | Temp 98.4°F | Resp 20 | Wt 155.0 lb

## 2023-04-20 DIAGNOSIS — Z5111 Encounter for antineoplastic chemotherapy: Secondary | ICD-10-CM | POA: Diagnosis not present

## 2023-04-20 DIAGNOSIS — C7931 Secondary malignant neoplasm of brain: Secondary | ICD-10-CM | POA: Diagnosis not present

## 2023-04-20 DIAGNOSIS — C7971 Secondary malignant neoplasm of right adrenal gland: Secondary | ICD-10-CM | POA: Diagnosis not present

## 2023-04-20 DIAGNOSIS — Z5189 Encounter for other specified aftercare: Secondary | ICD-10-CM | POA: Diagnosis not present

## 2023-04-20 DIAGNOSIS — Z803 Family history of malignant neoplasm of breast: Secondary | ICD-10-CM | POA: Diagnosis not present

## 2023-04-20 DIAGNOSIS — C182 Malignant neoplasm of ascending colon: Secondary | ICD-10-CM

## 2023-04-20 DIAGNOSIS — C7802 Secondary malignant neoplasm of left lung: Secondary | ICD-10-CM | POA: Diagnosis not present

## 2023-04-20 DIAGNOSIS — C787 Secondary malignant neoplasm of liver and intrahepatic bile duct: Secondary | ICD-10-CM

## 2023-04-20 DIAGNOSIS — C189 Malignant neoplasm of colon, unspecified: Secondary | ICD-10-CM | POA: Diagnosis not present

## 2023-04-20 DIAGNOSIS — C7800 Secondary malignant neoplasm of unspecified lung: Secondary | ICD-10-CM

## 2023-04-20 DIAGNOSIS — C786 Secondary malignant neoplasm of retroperitoneum and peritoneum: Secondary | ICD-10-CM | POA: Diagnosis not present

## 2023-04-20 DIAGNOSIS — C7801 Secondary malignant neoplasm of right lung: Secondary | ICD-10-CM | POA: Diagnosis not present

## 2023-04-20 MED ORDER — FILGRASTIM-SNDZ 480 MCG/0.8ML IJ SOSY
480.0000 ug | PREFILLED_SYRINGE | Freq: Once | INTRAMUSCULAR | Status: AC
  Administered 2023-04-20: 480 ug via SUBCUTANEOUS
  Filled 2023-04-20: qty 0.8

## 2023-04-20 NOTE — Patient Instructions (Signed)
Filgrastim Injection What is this medication? FILGRASTIM (fil GRA stim) lowers the risk of infection in people who are receiving chemotherapy. It works by helping your body make more white blood cells, which protects your body from infection. It may also be used to help people who have been exposed to high doses of radiation. It can be used to help prepare your body before a stem cell transplant. It works by helping your bone marrow make and release stem cells into the blood. This medicine may be used for other purposes; ask your health care provider or pharmacist if you have questions. COMMON BRAND NAME(S): Neupogen, Nivestym, Releuko, Zarxio What should I tell my care team before I take this medication? They need to know if you have any of these conditions: History of blood diseases, such as sickle cell anemia Kidney disease Recent or ongoing radiation An unusual or allergic reaction to filgrastim, pegfilgrastim, latex, rubber, other medications, foods, dyes, or preservatives Pregnant or trying to get pregnant Breast-feeding How should I use this medication? This medication is injected under the skin or into a vein. It is usually given by your care team in a hospital or clinic setting. It may be given at home. If you get this medication at home, you will be taught how to prepare and give it. Use exactly as directed. Take it as directed on the prescription label at the same time every day. Keep taking it unless your care team tells you to stop. It is important that you put your used needles and syringes in a special sharps container. Do not put them in a trash can. If you do not have a sharps container, call your pharmacist or care team to get one. This medication comes with INSTRUCTIONS FOR USE. Ask your pharmacist for directions on how to use this medication. Read the information carefully. Talk to your pharmacist or care team if you have questions. Talk to your care team about the use of this  medication in children. While it may be prescribed for children for selected conditions, precautions do apply. Overdosage: If you think you have taken too much of this medicine contact a poison control center or emergency room at once. NOTE: This medicine is only for you. Do not share this medicine with others. What if I miss a dose? It is important not to miss any doses. Talk to your care team about what to do if you miss a dose. What may interact with this medication? Medications that may cause a release of neutrophils, such as lithium This list may not describe all possible interactions. Give your health care provider a list of all the medicines, herbs, non-prescription drugs, or dietary supplements you use. Also tell them if you smoke, drink alcohol, or use illegal drugs. Some items may interact with your medicine. What should I watch for while using this medication? Your condition will be monitored carefully while you are receiving this medication. You may need bloodwork while taking this medication. Talk to your care team about your risk of cancer. You may be more at risk for certain types of cancer if you take this medication. What side effects may I notice from receiving this medication? Side effects that you should report to your care team as soon as possible: Allergic reactions--skin rash, itching, hives, swelling of the face, lips, tongue, or throat Capillary leak syndrome--stomach or muscle pain, unusual weakness or fatigue, feeling faint or lightheaded, decrease in the amount of urine, swelling of the ankles, hands, or   feet, trouble breathing High white blood cell level--fever, fatigue, trouble breathing, night sweats, change in vision, weight loss Inflammation of the aorta--fever, fatigue, back, chest, or stomach pain, severe headache Kidney injury (glomerulonephritis)--decrease in the amount of urine, red or dark brown urine, foamy or bubbly urine, swelling of the ankles, hands, or  feet Shortness of breath or trouble breathing Spleen injury--pain in upper left stomach or shoulder Unusual bruising or bleeding Side effects that usually do not require medical attention (report to your care team if they continue or are bothersome): Back pain Bone pain Fatigue Fever Headache Nausea This list may not describe all possible side effects. Call your doctor for medical advice about side effects. You may report side effects to FDA at 1-800-FDA-1088. Where should I keep my medication? Keep out of the reach of children and pets. Keep this medication in the original packaging until you are ready to take it. Protect from light. See product for storage information. Each product may have different instructions. Get rid of any unused medication after the expiration date. To get rid of medications that are no longer needed or have expired: Take the medication to a medications take-back program. Check with your pharmacy or law enforcement to find a location. If you cannot return the medication, ask your pharmacist or care team how to get rid of this medication safely. NOTE: This sheet is a summary. It may not cover all possible information. If you have questions about this medicine, talk to your doctor, pharmacist, or health care provider.  2024 Elsevier/Gold Standard (2021-10-09 00:00:00)  

## 2023-04-21 ENCOUNTER — Encounter: Payer: Self-pay | Admitting: Oncology

## 2023-04-21 ENCOUNTER — Inpatient Hospital Stay: Payer: 59

## 2023-04-21 VITALS — BP 117/81 | HR 102 | Temp 97.6°F | Resp 18 | Wt 152.1 lb

## 2023-04-21 DIAGNOSIS — C7971 Secondary malignant neoplasm of right adrenal gland: Secondary | ICD-10-CM | POA: Diagnosis not present

## 2023-04-21 DIAGNOSIS — C7802 Secondary malignant neoplasm of left lung: Secondary | ICD-10-CM | POA: Diagnosis not present

## 2023-04-21 DIAGNOSIS — C7800 Secondary malignant neoplasm of unspecified lung: Secondary | ICD-10-CM

## 2023-04-21 DIAGNOSIS — Z5111 Encounter for antineoplastic chemotherapy: Secondary | ICD-10-CM | POA: Diagnosis not present

## 2023-04-21 DIAGNOSIS — C182 Malignant neoplasm of ascending colon: Secondary | ICD-10-CM

## 2023-04-21 DIAGNOSIS — Z5189 Encounter for other specified aftercare: Secondary | ICD-10-CM | POA: Diagnosis not present

## 2023-04-21 DIAGNOSIS — C189 Malignant neoplasm of colon, unspecified: Secondary | ICD-10-CM | POA: Diagnosis not present

## 2023-04-21 DIAGNOSIS — C7801 Secondary malignant neoplasm of right lung: Secondary | ICD-10-CM | POA: Diagnosis not present

## 2023-04-21 DIAGNOSIS — C786 Secondary malignant neoplasm of retroperitoneum and peritoneum: Secondary | ICD-10-CM | POA: Diagnosis not present

## 2023-04-21 DIAGNOSIS — C7931 Secondary malignant neoplasm of brain: Secondary | ICD-10-CM | POA: Diagnosis not present

## 2023-04-21 DIAGNOSIS — C787 Secondary malignant neoplasm of liver and intrahepatic bile duct: Secondary | ICD-10-CM | POA: Diagnosis not present

## 2023-04-21 DIAGNOSIS — Z803 Family history of malignant neoplasm of breast: Secondary | ICD-10-CM | POA: Diagnosis not present

## 2023-04-21 MED ORDER — FILGRASTIM-SNDZ 480 MCG/0.8ML IJ SOSY
480.0000 ug | PREFILLED_SYRINGE | Freq: Once | INTRAMUSCULAR | Status: AC
  Administered 2023-04-21: 480 ug via SUBCUTANEOUS
  Filled 2023-04-21: qty 0.8

## 2023-04-21 NOTE — Patient Instructions (Signed)
Filgrastim Injection What is this medication? FILGRASTIM (fil GRA stim) lowers the risk of infection in people who are receiving chemotherapy. It works by helping your body make more white blood cells, which protects your body from infection. It may also be used to help people who have been exposed to high doses of radiation. It can be used to help prepare your body before a stem cell transplant. It works by helping your bone marrow make and release stem cells into the blood. This medicine may be used for other purposes; ask your health care provider or pharmacist if you have questions. COMMON BRAND NAME(S): Neupogen, Nivestym, Releuko, Zarxio What should I tell my care team before I take this medication? They need to know if you have any of these conditions: History of blood diseases, such as sickle cell anemia Kidney disease Recent or ongoing radiation An unusual or allergic reaction to filgrastim, pegfilgrastim, latex, rubber, other medications, foods, dyes, or preservatives Pregnant or trying to get pregnant Breast-feeding How should I use this medication? This medication is injected under the skin or into a vein. It is usually given by your care team in a hospital or clinic setting. It may be given at home. If you get this medication at home, you will be taught how to prepare and give it. Use exactly as directed. Take it as directed on the prescription label at the same time every day. Keep taking it unless your care team tells you to stop. It is important that you put your used needles and syringes in a special sharps container. Do not put them in a trash can. If you do not have a sharps container, call your pharmacist or care team to get one. This medication comes with INSTRUCTIONS FOR USE. Ask your pharmacist for directions on how to use this medication. Read the information carefully. Talk to your pharmacist or care team if you have questions. Talk to your care team about the use of this  medication in children. While it may be prescribed for children for selected conditions, precautions do apply. Overdosage: If you think you have taken too much of this medicine contact a poison control center or emergency room at once. NOTE: This medicine is only for you. Do not share this medicine with others. What if I miss a dose? It is important not to miss any doses. Talk to your care team about what to do if you miss a dose. What may interact with this medication? Medications that may cause a release of neutrophils, such as lithium This list may not describe all possible interactions. Give your health care provider a list of all the medicines, herbs, non-prescription drugs, or dietary supplements you use. Also tell them if you smoke, drink alcohol, or use illegal drugs. Some items may interact with your medicine. What should I watch for while using this medication? Your condition will be monitored carefully while you are receiving this medication. You may need bloodwork while taking this medication. Talk to your care team about your risk of cancer. You may be more at risk for certain types of cancer if you take this medication. What side effects may I notice from receiving this medication? Side effects that you should report to your care team as soon as possible: Allergic reactions--skin rash, itching, hives, swelling of the face, lips, tongue, or throat Capillary leak syndrome--stomach or muscle pain, unusual weakness or fatigue, feeling faint or lightheaded, decrease in the amount of urine, swelling of the ankles, hands, or   feet, trouble breathing High white blood cell level--fever, fatigue, trouble breathing, night sweats, change in vision, weight loss Inflammation of the aorta--fever, fatigue, back, chest, or stomach pain, severe headache Kidney injury (glomerulonephritis)--decrease in the amount of urine, red or dark brown urine, foamy or bubbly urine, swelling of the ankles, hands, or  feet Shortness of breath or trouble breathing Spleen injury--pain in upper left stomach or shoulder Unusual bruising or bleeding Side effects that usually do not require medical attention (report to your care team if they continue or are bothersome): Back pain Bone pain Fatigue Fever Headache Nausea This list may not describe all possible side effects. Call your doctor for medical advice about side effects. You may report side effects to FDA at 1-800-FDA-1088. Where should I keep my medication? Keep out of the reach of children and pets. Keep this medication in the original packaging until you are ready to take it. Protect from light. See product for storage information. Each product may have different instructions. Get rid of any unused medication after the expiration date. To get rid of medications that are no longer needed or have expired: Take the medication to a medications take-back program. Check with your pharmacy or law enforcement to find a location. If you cannot return the medication, ask your pharmacist or care team how to get rid of this medication safely. NOTE: This sheet is a summary. It may not cover all possible information. If you have questions about this medicine, talk to your doctor, pharmacist, or health care provider.  2024 Elsevier/Gold Standard (2021-10-09 00:00:00)  

## 2023-04-23 ENCOUNTER — Encounter: Payer: Self-pay | Admitting: Oncology

## 2023-04-23 DIAGNOSIS — E1169 Type 2 diabetes mellitus with other specified complication: Secondary | ICD-10-CM | POA: Diagnosis not present

## 2023-04-23 DIAGNOSIS — C189 Malignant neoplasm of colon, unspecified: Secondary | ICD-10-CM | POA: Diagnosis not present

## 2023-04-23 DIAGNOSIS — C7800 Secondary malignant neoplasm of unspecified lung: Secondary | ICD-10-CM | POA: Diagnosis not present

## 2023-04-23 LAB — BASIC METABOLIC PANEL: EGFR: 36

## 2023-04-25 NOTE — Progress Notes (Signed)
[facility]  [address] [place],  [place]  [address] [phone]  Clinic Day:  04/26/2023  Referring physician: Alinda Deem, MD  HISTORY OF PRESENT ILLNESS:  The patient is a 57 y.o. female with  metastatic colon cancer, which includes spread of disease to her abdominal cavity, lungs, brain and right adrenal gland.  She comes in today to go over her CT scans to ascertain her new disease baseline after 16 cycles of  FOLFOX/Avastin.  The patient claims to have tolerated her 16th cycle of FOLFOX/Avastin fairly well.  However, she has had some mild defects in her visual field, which she has intermittently had in the past.  Of note, she had a brain MRI within the past week, which showed no evidence of CNS recurrence.  Of note, her oxaliplatin dose was reduced with her 12th cycle of chemotherapy due to the increased numbness in her fingers.   With respect to her colon cancer history, she is status post a right hemicolectomy in early October 2018, followed by 12 cycles of adjuvant FOLFOX chemotherapy, which were completed in April 2019.  In December 2021, she underwent a left cerebellar metastasectomy, whose pathology was consistent with metastatic colon cancer.  Tumor testing did come back MMR normal. CT scans revealed evidence of her cancer being in multiple locations, for which she took 40 cycles of FOLFIRI/Avastin.  As follow-up scans revealed liver metastasis, she was placed back on FOLFOX/Avastin, for which she continues to take.  PHYSICAL EXAM:  Blood pressure 96/69, pulse (!) 101, temperature 97.8 F (36.6 C), resp. rate 14, height 5\' 8"  (1.727 m), weight 155 lb 8 oz (70.5 kg), last menstrual period 02/22/2019, SpO2 99%. Body mass index is 23.64 kg/m.  Performance status (ECOG): 1 - Symptomatic but completely ambulatory  Physical Exam Vitals and nursing note reviewed.  Constitutional:      General: She is not in acute distress.    Appearance:  Normal appearance.  HENT:     Head: Normocephalic and atraumatic.     Mouth/Throat:     Mouth: Mucous membranes are moist.     Pharynx: Oropharynx is clear. No oropharyngeal exudate or posterior oropharyngeal erythema.  Eyes:     General: No scleral icterus.    Extraocular Movements: Extraocular movements intact.     Conjunctiva/sclera: Conjunctivae normal.     Pupils: Pupils are equal, round, and reactive to light.  Cardiovascular:     Rate and Rhythm: Normal rate and regular rhythm.     Heart sounds: Normal heart sounds. No murmur heard.    No friction rub. No gallop.  Pulmonary:     Effort: Pulmonary effort is normal.     Breath sounds: Normal breath sounds. No wheezing, rhonchi or rales.  Abdominal:     General: There is no distension.     Palpations: Abdomen is soft. There is no hepatomegaly, splenomegaly or mass.     Tenderness: There is no abdominal tenderness.  Musculoskeletal:        General: Normal range of motion.     Cervical back: Normal range of motion and neck supple. No tenderness.     Right lower leg: No edema.     Left lower leg: No edema.  Lymphadenopathy:     Cervical: No cervical adenopathy.     Upper Body:     Right upper body: No supraclavicular or axillary adenopathy.     Left upper body: No supraclavicular or axillary adenopathy.  Lower Body: No right inguinal adenopathy. No left inguinal adenopathy.  Skin:    General: Skin is warm and dry.     Coloration: Skin is not jaundiced.     Findings: No rash.  Neurological:     Mental Status: She is alert and oriented to person, place, and time.     Cranial Nerves: No cranial nerve deficit.  Psychiatric:        Mood and Affect: Mood normal.        Behavior: Behavior normal.        Thought Content: Thought content normal.   SCANS:  CT scans of her chest/abdomen/pelvis revealed the following: FINDINGS:  CT CHEST FINDINGS:  Lungs/pleura: Spiculated nodule within the posterior right upper lobe is  stable measuring 1.5 x 2.7 cm on axial image 39, previously measured 1.4 x 2.7 cm.  Mediastinal/ hilar lymph nodes: Unremarkable. Bones: Unremarkable for age. Axilla: No lymphadenopathy. Soft tissue: Unremarkable. Thyroid: Any visualized thyroid tissue is unremarkable. Additional comments: None   CT ABDOMEN AND PELVIS FINDINGS:  Upper GI tract: Unremarkable.  Liver: There is fatty infiltration of the liver. No hepatic mass seen. There is no ductal dilatation. Gallbladder: There is mild thickening of the gallbladder wall with questionable pericholecystic fluid. Follow-up ultrasound is recommended. There are no gallstones. Biliary System: Unremarkable for age. Spleen: Normal size. Pancreas: Unremarkable.  Adrenal glands: Unremarkable. Kidneys/ureters: No mass; No hydronephrosis. Included portion of the ureters are unremarkable.  Veins: Lymph nodes: No adenopathy.  Small bowel: Unremarkable; no obstruction. Colon: Normal caliber; no wall thickening. Appendix: No findings of appendicitis. Peritoneum: No free air or fluid.  GU: Unremarkable.  Bones: Unremarkable for age.  Laxity of the ventral wall is again seen.  Additional comments: None.  CTA ABDOMEN AND PELVIS FINDINGS:  ABDOMINAL AORTA: Normal caliber with no evidence of aneurysm.  AORTIC BRANCHES: Right renal artery: Widely patent. Left renal artery: Widely patent. Celiac artery: Widely patent. Superior mesenteric artery: Widely patent. Inferior mesenteric artery: Widely patent.  PELVIC ARTERIES; RIGHT PELVIS: Common iliac: Widely patent. External iliac: Widely patent. Internal iliac: Widely patent. Common femoral artery: Widely patent.  LEFT PELVIS:  Common iliac: Widely patent. External iliac: Widely patent. Internal iliac: Widely patent. Common femoral artery: Widely patent.  Additional comments: None.  IMPRESSION: Stable spiculated nodule within the right upper lobe.  Hepatic steatosis is  noted.  No acute  LABS:      Latest Ref Rng & Units 04/26/2023   11:44 AM 04/13/2023   12:12 PM 03/29/2023   12:00 AM  CBC  WBC 4.0 - 10.5 K/uL 3.2  2.6  3.6      Hemoglobin 12.0 - 15.0 g/dL 16.1  09.6  04.5      Hematocrit 36.0 - 46.0 % 36.1  34.9  37      Platelets 150 - 400 K/uL 53  59  52         This result is from an external source.      Latest Ref Rng & Units 04/26/2023   11:44 AM 04/13/2023   12:12 PM 03/29/2023    2:41 PM  CMP  Glucose 70 - 99 mg/dL 409  86  811   BUN 6 - 20 mg/dL 12  14  21    Creatinine 0.44 - 1.00 mg/dL 9.14  7.82  9.56   Sodium 135 - 145 mmol/L 141  144  142   Potassium 3.5 - 5.1 mmol/L 4.0  3.8  4.4   Chloride 98 -  111 mmol/L 105  106  104   CO2 22 - 32 mmol/L 24  26  27    Calcium 8.9 - 10.3 mg/dL 9.6  9.7  9.9   Total Protein 6.5 - 8.1 g/dL 6.1  6.1  6.7   Total Bilirubin <1.2 mg/dL 0.3  0.4  0.7   Alkaline Phos 38 - 126 U/L 180  171  126   AST 15 - 41 U/L 30  37  24   ALT 0 - 44 U/L 34  45  22    ASSESSMENT & PLAN:  Assessment/Plan:  A 57 y.o. female with metastatic colon cancer.  In clinic today, I went over all of his CT scan images with her, for which she could see her disease remains under ideal control.  Based upon this, she will proceed with her 17th cycle of FOLFOX/Avastin this week.  Christeen Douglas will continue to be given to prevent severe neutropenia from her chemotherapy from causing any spontaneous infections.  Furthermore, I will begin spacing all future cycles of chemotherapy out to every 3 weeks to give her bone marrow an extra week to recuperate from each successive cycle of treatment.  Otherwise, I will see her back in 3 weeks before she heads into her 18th cycle of FOLFOX/Avastin. The patient understands all the plans discussed today and is in agreement with them.  Layken Doenges Kirby Funk, MD

## 2023-04-26 ENCOUNTER — Other Ambulatory Visit: Payer: 59

## 2023-04-26 ENCOUNTER — Encounter: Payer: Self-pay | Admitting: Oncology

## 2023-04-26 ENCOUNTER — Inpatient Hospital Stay: Payer: 59

## 2023-04-26 ENCOUNTER — Other Ambulatory Visit: Payer: Self-pay | Admitting: Pharmacist

## 2023-04-26 ENCOUNTER — Inpatient Hospital Stay (HOSPITAL_BASED_OUTPATIENT_CLINIC_OR_DEPARTMENT_OTHER): Payer: 59 | Admitting: Oncology

## 2023-04-26 VITALS — BP 96/69 | HR 101 | Temp 97.8°F | Resp 14 | Ht 68.0 in | Wt 155.5 lb

## 2023-04-26 VITALS — HR 90

## 2023-04-26 DIAGNOSIS — C189 Malignant neoplasm of colon, unspecified: Secondary | ICD-10-CM

## 2023-04-26 DIAGNOSIS — C787 Secondary malignant neoplasm of liver and intrahepatic bile duct: Secondary | ICD-10-CM | POA: Diagnosis not present

## 2023-04-26 DIAGNOSIS — C182 Malignant neoplasm of ascending colon: Secondary | ICD-10-CM | POA: Diagnosis not present

## 2023-04-26 DIAGNOSIS — C7802 Secondary malignant neoplasm of left lung: Secondary | ICD-10-CM | POA: Diagnosis not present

## 2023-04-26 DIAGNOSIS — Z5189 Encounter for other specified aftercare: Secondary | ICD-10-CM | POA: Diagnosis not present

## 2023-04-26 DIAGNOSIS — C786 Secondary malignant neoplasm of retroperitoneum and peritoneum: Secondary | ICD-10-CM | POA: Diagnosis not present

## 2023-04-26 DIAGNOSIS — C7801 Secondary malignant neoplasm of right lung: Secondary | ICD-10-CM | POA: Diagnosis not present

## 2023-04-26 DIAGNOSIS — Z803 Family history of malignant neoplasm of breast: Secondary | ICD-10-CM | POA: Diagnosis not present

## 2023-04-26 DIAGNOSIS — C7931 Secondary malignant neoplasm of brain: Secondary | ICD-10-CM | POA: Diagnosis not present

## 2023-04-26 DIAGNOSIS — C7971 Secondary malignant neoplasm of right adrenal gland: Secondary | ICD-10-CM | POA: Diagnosis not present

## 2023-04-26 DIAGNOSIS — R110 Nausea: Secondary | ICD-10-CM

## 2023-04-26 DIAGNOSIS — Z5111 Encounter for antineoplastic chemotherapy: Secondary | ICD-10-CM | POA: Diagnosis not present

## 2023-04-26 LAB — CMP (CANCER CENTER ONLY)
ALT: 34 U/L (ref 0–44)
AST: 30 U/L (ref 15–41)
Albumin: 3.7 g/dL (ref 3.5–5.0)
Alkaline Phosphatase: 180 U/L — ABNORMAL HIGH (ref 38–126)
Anion gap: 12 (ref 5–15)
BUN: 12 mg/dL (ref 6–20)
CO2: 24 mmol/L (ref 22–32)
Calcium: 9.6 mg/dL (ref 8.9–10.3)
Chloride: 105 mmol/L (ref 98–111)
Creatinine: 1.44 mg/dL — ABNORMAL HIGH (ref 0.44–1.00)
GFR, Estimated: 43 mL/min — ABNORMAL LOW (ref >60)
Glucose, Bld: 159 mg/dL — ABNORMAL HIGH (ref 70–99)
Potassium: 4 mmol/L (ref 3.5–5.1)
Sodium: 141 mmol/L (ref 135–145)
Total Bilirubin: 0.3 mg/dL (ref <1.2)
Total Protein: 6.1 g/dL — ABNORMAL LOW (ref 6.5–8.1)

## 2023-04-26 LAB — CBC WITH DIFFERENTIAL (CANCER CENTER ONLY)
Abs Immature Granulocytes: 0.02 10*3/uL (ref 0.00–0.07)
Basophils Absolute: 0 10*3/uL (ref 0.0–0.1)
Basophils Relative: 1 %
Eosinophils Absolute: 0 10*3/uL (ref 0.0–0.5)
Eosinophils Relative: 1 %
HCT: 36.1 % (ref 36.0–46.0)
Hemoglobin: 11.8 g/dL — ABNORMAL LOW (ref 12.0–15.0)
Immature Granulocytes: 1 %
Immature Platelet Fraction: 3.2 % (ref 1.2–8.6)
Lymphocytes Relative: 43 %
Lymphs Abs: 1.4 10*3/uL (ref 0.7–4.0)
MCH: 33.5 pg (ref 26.0–34.0)
MCHC: 32.7 g/dL (ref 30.0–36.0)
MCV: 102.6 fL — ABNORMAL HIGH (ref 80.0–100.0)
Monocytes Absolute: 0.5 10*3/uL (ref 0.1–1.0)
Monocytes Relative: 17 %
Neutro Abs: 1.2 10*3/uL — ABNORMAL LOW (ref 1.7–7.7)
Neutrophils Relative %: 37 %
Platelet Count: 53 10*3/uL — ABNORMAL LOW (ref 150–400)
RBC: 3.52 MIL/uL — ABNORMAL LOW (ref 3.87–5.11)
RDW: 15.9 % — ABNORMAL HIGH (ref 11.5–15.5)
WBC Count: 3.2 10*3/uL — ABNORMAL LOW (ref 4.0–10.5)
nRBC: 0 % (ref 0.0–0.2)
nRBC: 0 /100{WBCs}

## 2023-04-26 MED ORDER — FAMOTIDINE IN NACL 20-0.9 MG/50ML-% IV SOLN
20.0000 mg | Freq: Once | INTRAVENOUS | Status: AC
  Administered 2023-04-26: 20 mg via INTRAVENOUS
  Filled 2023-04-26: qty 50

## 2023-04-26 MED ORDER — DIPHENHYDRAMINE HCL 50 MG/ML IJ SOLN
25.0000 mg | Freq: Once | INTRAMUSCULAR | Status: AC
  Administered 2023-04-26: 25 mg via INTRAVENOUS
  Filled 2023-04-26: qty 1

## 2023-04-26 MED ORDER — FLUOROURACIL CHEMO INJECTION 2.5 GM/50ML
400.0000 mg/m2 | Freq: Once | INTRAVENOUS | Status: AC
Start: 2023-04-26 — End: 2023-04-26
  Administered 2023-04-26: 700 mg via INTRAVENOUS
  Filled 2023-04-26: qty 14

## 2023-04-26 MED ORDER — DEXTROSE 5 % IV SOLN: Freq: Once | INTRAVENOUS | Status: AC

## 2023-04-26 MED ORDER — DEXAMETHASONE SODIUM PHOSPHATE 10 MG/ML IJ SOLN
10.0000 mg | Freq: Once | INTRAMUSCULAR | Status: AC
  Administered 2023-04-26: 10 mg via INTRAVENOUS
  Filled 2023-04-26: qty 1

## 2023-04-26 MED ORDER — SODIUM CHLORIDE 0.9% FLUSH: 10.0000 mL | INTRAVENOUS | Status: DC | PRN

## 2023-04-26 MED ORDER — SODIUM CHLORIDE 0.9 % IV SOLN
2400.0000 mg/m2 | INTRAVENOUS | Status: DC
  Administered 2023-04-26: 4350 mg via INTRAVENOUS
  Filled 2023-04-26: qty 87

## 2023-04-26 MED ORDER — SODIUM CHLORIDE 0.9 % IV SOLN
Freq: Once | INTRAVENOUS | Status: AC
Start: 2023-04-26 — End: 2023-04-26

## 2023-04-26 MED ORDER — PALONOSETRON HCL INJECTION 0.25 MG/5ML
0.2500 mg | Freq: Once | INTRAVENOUS | Status: AC
  Administered 2023-04-26: 0.25 mg via INTRAVENOUS
  Filled 2023-04-26: qty 5

## 2023-04-26 MED ORDER — SODIUM CHLORIDE 0.9 % IV SOLN
150.0000 mg | Freq: Once | INTRAVENOUS | Status: AC
  Administered 2023-04-26: 150 mg via INTRAVENOUS
  Filled 2023-04-26: qty 150

## 2023-04-26 MED ORDER — SODIUM CHLORIDE 0.9 % IV SOLN
5.0000 mg/kg | Freq: Once | INTRAVENOUS | Status: AC
  Administered 2023-04-26: 350 mg via INTRAVENOUS
  Filled 2023-04-26: qty 14

## 2023-04-26 MED ORDER — OXALIPLATIN CHEMO INJECTION 50 MG/10ML
63.7500 mg/m2 | Freq: Once | INTRAVENOUS | Status: AC
  Administered 2023-04-26: 115 mg via INTRAVENOUS
  Filled 2023-04-26: qty 20

## 2023-04-26 NOTE — Progress Notes (Signed)
Ok to proceed with chemo today despite plt=53,000 and ANC=1200 per Dr. Melvyn Neth.  Patient's treatment plan will be spaced out to every 21 days after this cycle.  Patient will continue to receive zarxio injections to help bolster her ANC.

## 2023-04-26 NOTE — Patient Instructions (Signed)
The chemotherapy medication bag should finish at 46 hours. For example, if your pump is scheduled for 46 hours and it was put on at 4:00 p.m., it should finish at 2:00 p.m. the day it is scheduled to come off regardless of your appointment time.     Estimated time to finish at 2pm.   If the display on your pump reads "Low Volume" and it is beeping, take the batteries out of the pump and come to the cancer center for it to be taken off.   If the pump alarms go off prior to the pump reading "Low Volume" then call [phone] and someone can assist you.  If the plunger comes out and the chemotherapy medication is leaking out, please use your home chemo spill kit to clean up the spill. Do NOT use paper towels or other household products.  If you have problems or questions regarding your pump, please call either [phone] (24 hours a day) or the cancer center Monday-Friday 8:00 a.m.- 4:30 p.m. at the clinic number and we will assist you. If you are unable to get assistance, then go to the nearest Emergency Department and ask the staff to contact the IV team for assistance.

## 2023-04-28 ENCOUNTER — Inpatient Hospital Stay: Payer: 59

## 2023-04-28 VITALS — BP 106/59 | HR 92 | Temp 98.9°F | Resp 16

## 2023-04-28 DIAGNOSIS — C7800 Secondary malignant neoplasm of unspecified lung: Secondary | ICD-10-CM

## 2023-04-28 DIAGNOSIS — C182 Malignant neoplasm of ascending colon: Secondary | ICD-10-CM

## 2023-04-28 DIAGNOSIS — Z5111 Encounter for antineoplastic chemotherapy: Secondary | ICD-10-CM | POA: Diagnosis not present

## 2023-04-28 DIAGNOSIS — C786 Secondary malignant neoplasm of retroperitoneum and peritoneum: Secondary | ICD-10-CM | POA: Diagnosis not present

## 2023-04-28 DIAGNOSIS — C7801 Secondary malignant neoplasm of right lung: Secondary | ICD-10-CM | POA: Diagnosis not present

## 2023-04-28 DIAGNOSIS — C7971 Secondary malignant neoplasm of right adrenal gland: Secondary | ICD-10-CM | POA: Diagnosis not present

## 2023-04-28 DIAGNOSIS — Z5189 Encounter for other specified aftercare: Secondary | ICD-10-CM | POA: Diagnosis not present

## 2023-04-28 DIAGNOSIS — C189 Malignant neoplasm of colon, unspecified: Secondary | ICD-10-CM | POA: Diagnosis not present

## 2023-04-28 DIAGNOSIS — Z803 Family history of malignant neoplasm of breast: Secondary | ICD-10-CM | POA: Diagnosis not present

## 2023-04-28 DIAGNOSIS — C7931 Secondary malignant neoplasm of brain: Secondary | ICD-10-CM | POA: Diagnosis not present

## 2023-04-28 DIAGNOSIS — C787 Secondary malignant neoplasm of liver and intrahepatic bile duct: Secondary | ICD-10-CM | POA: Diagnosis not present

## 2023-04-28 DIAGNOSIS — C7802 Secondary malignant neoplasm of left lung: Secondary | ICD-10-CM | POA: Diagnosis not present

## 2023-04-28 MED ORDER — HEPARIN SOD (PORK) LOCK FLUSH 100 UNIT/ML IV SOLN
500.0000 [IU] | Freq: Once | INTRAVENOUS | Status: AC | PRN
  Administered 2023-04-28: 500 [IU]

## 2023-04-28 MED ORDER — SODIUM CHLORIDE 0.9% FLUSH
10.0000 mL | INTRAVENOUS | Status: DC | PRN
  Administered 2023-04-28: 10 mL

## 2023-04-28 NOTE — Patient Instructions (Signed)
Managing Chemotherapy Side Effects, Adult Chemotherapy is a treatment that uses medicine to kill cancer cells. However, in addition to killing cancer cells, the medicines can also damage healthy cells. The damage to healthy cells can lead to side effects. The exact side effects depend on the specific medicines used. Most of the side effects of chemotherapy go away once treatment is finished. Until then, work closely with your health care providers and take an active role in managing your side effects. What are common side effects of chemotherapy? Increased risk of infection, bruising, or bleeding. Nausea and vomiting. Constipation or diarrhea. Loss of appetite. Hair loss. Mouth or throat sores. Tiredness (fatigue). Tingling, pain, or numbness in the hands and feet. Dry, sensitive, itchy, or sore skin. Sleep disturbances, such as excessive sleepiness. Confusion, anxiety, or mood swings. Memory changes. How to manage the side effects of chemotherapy Medicines Take over-the-counter and prescription medicines only as told by your health care provider. Talk with your health care provider before taking vitamins, herbs, supplements, or over-the-counter medicines. Some of these can interfere with chemotherapy. Activity Get plenty of rest. Get regular exercise by doing activities such as walking, gentle yoga, or tai chi. Return to your normal activities as told by your health care provider. Ask your health care provider what activities are safe for you. Eating and drinking  Talk to a dietitian about what you should eat and drink during cancer treatment. Drink enough fluid to keep your urine pale yellow. If you have side effects that affect eating, these tips may help: Eat smaller meals and snacks often. Drink high-nutrition and high-calorie shakes or supplements. Choose bland and soft foods that are easy to eat. Do not eat foods that are hot, spicy, or hard to swallow. Do not eat raw or  undercooked meat, eggs, or seafood. Always wash fresh fruits and vegetables well before eating them. Skin care If you have sore or itchy skin: Wear soft, comfortable clothing. Apply creams and ointments to your skin as told by your health care provider. If you lose your hair, consider wearing a wig, hat, or scarf to cover your head. You may want to have someone shave your head as you start to lose hair. During outdoor activities, protect your head and skin from the sun by using sunscreen with an SPF of 30 or higher or by wearing protective clothing and a hat. Meet with a hair and skin care specialist for makeup and skin care tips. Apply sunscreen to your scalp as told by your health care provider. General tips Learn as much as you can about your condition. If you are struggling emotionally, talk with a mental health care provider or join a support group. Keep all follow-up visits. This is important. How to prevent infection and bleeding Chemotherapy may lower your blood counts and put you at risk for infection and bleeding. Here are some ways to help prevent problems. Vaccines Talk to your health care provider about vaccines. You should not get any live vaccines, such as the polio, MMR, chickenpox, and shingles vaccines until your health care provider says that it is safe to do so. Do not be around people who have had live vaccines for as long as your health care provider recommends. Make sure you get a yearly flu shot. People who will be near you should also get a yearly flu shot. Social activity Stay away from crowded places where you could be exposed to germs. Do not be around people who may be sick or  people who have symptoms of a fever until they have been fever-free for at least 24 hours. Do not share food, cups, straws, or utensils with other people. Wear a mask when outside the home if your blood counts are low. Cleanliness  Wash your hands often for at least 20 seconds. Also make  sure that other members of your household wash their hands often. Brush your teeth twice daily using a soft toothbrush. Use mouth rinse only as told by your health care provider. Take a bath or shower daily unless your health care provider gives different instructions. General tips Take your temperature regularly, especially if you have chills or feel warm. Check with your health care provider: Before you travel. Before you have a dental procedure. Before you use a swimming pool, hot tub, or swim in a lake or ocean. If you get chemotherapy through an IV or port, check the site every day for signs of infection. Check for redness, swelling, pain, fluid, and warmth. Avoid activities that put you at risk for injury or bleeding. Use an electric razor to shave instead of a blade. Questions to ask your health care provider What are the most common side effects of my treatment? How will they affect my daily life? What can I do to manage them? What are some possible long-term side effects? What are possible complications? What support services are available? What number can I call with questions or concerns? Where to find support Cancer affects the entire family. Find out what family support resources are available from your cancer treatment center. For more support, turn to: Your cancer care team. Friends and family. Your religious community. Other people with cancer. Community-based or online support groups. Where to find more information National Cancer Institute: www.cancer.gov American Cancer Society: www.cancer.org Contact a health care provider if: You bleed or bruise more often. You notice blood in your urine or stool. You have any of these symptoms: A skin rash, or dry or itchy skin. A headache or stiff neck. Cold or flu symptoms. A cough. Persistent nausea or vomiting. Persistent diarrhea. Frequent urination, burning when passing urine, or foul-smelling urine. You cannot eat  because of mouth or throat pain. You are sad, confused, anxious, or depressed. Get help right away if: You have any of these symptoms: A fever or chills. Your health care provider should know about this right away. Redness, swelling, pain, fluid, or warmth near an IV site. Bleeding that you cannot stop. A seizure. You cannot swallow. You have chest pain. You have trouble breathing. A family member or caregiver should get help right away if you have a sudden or unusual change in behavior. These symptoms may be an emergency. Get help right away. Call 911. Do not wait to see if the symptoms will go away. Do not drive yourself to the hospital. Summary Chemotherapy is a treatment that uses medicine to kill cancer cells and can cause side effects. The specific side effects depend on the specific medicines used. Learn as much as you can about your condition. Ask about side effects to watch for and how to treat them. Seek out support and resources from others. Find out what family support resources are available from your cancer treatment center. Let your health care provider know if you notice any new, unusual, or worsening symptoms, especially fever or chills. This information is not intended to replace advice given to you by your health care provider. Make sure you discuss any questions you have with your health  care provider. Document Revised: 05/08/2021 Document Reviewed: 05/08/2021 Elsevier Patient Education  2024 [employer].

## 2023-05-03 ENCOUNTER — Inpatient Hospital Stay: Payer: 59 | Attending: Oncology

## 2023-05-03 VITALS — BP 117/77 | HR 96 | Temp 98.0°F | Resp 18 | Ht 68.0 in

## 2023-05-03 DIAGNOSIS — C7801 Secondary malignant neoplasm of right lung: Secondary | ICD-10-CM | POA: Insufficient documentation

## 2023-05-03 DIAGNOSIS — C7802 Secondary malignant neoplasm of left lung: Secondary | ICD-10-CM | POA: Insufficient documentation

## 2023-05-03 DIAGNOSIS — C786 Secondary malignant neoplasm of retroperitoneum and peritoneum: Secondary | ICD-10-CM | POA: Insufficient documentation

## 2023-05-03 DIAGNOSIS — C787 Secondary malignant neoplasm of liver and intrahepatic bile duct: Secondary | ICD-10-CM | POA: Insufficient documentation

## 2023-05-03 DIAGNOSIS — Z5189 Encounter for other specified aftercare: Secondary | ICD-10-CM | POA: Diagnosis not present

## 2023-05-03 DIAGNOSIS — C7800 Secondary malignant neoplasm of unspecified lung: Secondary | ICD-10-CM

## 2023-05-03 DIAGNOSIS — C7971 Secondary malignant neoplasm of right adrenal gland: Secondary | ICD-10-CM | POA: Diagnosis not present

## 2023-05-03 DIAGNOSIS — C189 Malignant neoplasm of colon, unspecified: Secondary | ICD-10-CM | POA: Diagnosis not present

## 2023-05-03 DIAGNOSIS — Z5111 Encounter for antineoplastic chemotherapy: Secondary | ICD-10-CM | POA: Insufficient documentation

## 2023-05-03 DIAGNOSIS — C7931 Secondary malignant neoplasm of brain: Secondary | ICD-10-CM | POA: Insufficient documentation

## 2023-05-03 DIAGNOSIS — C182 Malignant neoplasm of ascending colon: Secondary | ICD-10-CM

## 2023-05-03 MED ORDER — FILGRASTIM-SNDZ 480 MCG/0.8ML IJ SOSY
480.0000 ug | PREFILLED_SYRINGE | Freq: Once | INTRAMUSCULAR | Status: AC
  Administered 2023-05-03: 480 ug via SUBCUTANEOUS
  Filled 2023-05-03: qty 0.8

## 2023-05-03 NOTE — Patient Instructions (Signed)
Filgrastim Injection What is this medication? FILGRASTIM (fil GRA stim) lowers the risk of infection in people who are receiving chemotherapy. It works by helping your body make more white blood cells, which protects your body from infection. It may also be used to help people who have been exposed to high doses of radiation. It can be used to help prepare your body before a stem cell transplant. It works by helping your bone marrow make and release stem cells into the blood. This medicine may be used for other purposes; ask your health care provider or pharmacist if you have questions. COMMON BRAND NAME(S): Neupogen, Nivestym, Releuko, Zarxio What should I tell my care team before I take this medication? They need to know if you have any of these conditions: History of blood diseases, such as sickle cell anemia Kidney disease Recent or ongoing radiation An unusual or allergic reaction to filgrastim, pegfilgrastim, latex, rubber, other medications, foods, dyes, or preservatives Pregnant or trying to get pregnant Breast-feeding How should I use this medication? This medication is injected under the skin or into a vein. It is usually given by your care team in a hospital or clinic setting. It may be given at home. If you get this medication at home, you will be taught how to prepare and give it. Use exactly as directed. Take it as directed on the prescription label at the same time every day. Keep taking it unless your care team tells you to stop. It is important that you put your used needles and syringes in a special sharps container. Do not put them in a trash can. If you do not have a sharps container, call your pharmacist or care team to get one. This medication comes with INSTRUCTIONS FOR USE. Ask your pharmacist for directions on how to use this medication. Read the information carefully. Talk to your pharmacist or care team if you have questions. Talk to your care team about the use of this  medication in children. While it may be prescribed for children for selected conditions, precautions do apply. Overdosage: If you think you have taken too much of this medicine contact a poison control center or emergency room at once. NOTE: This medicine is only for you. Do not share this medicine with others. What if I miss a dose? It is important not to miss any doses. Talk to your care team about what to do if you miss a dose. What may interact with this medication? Medications that may cause a release of neutrophils, such as lithium This list may not describe all possible interactions. Give your health care provider a list of all the medicines, herbs, non-prescription drugs, or dietary supplements you use. Also tell them if you smoke, drink alcohol, or use illegal drugs. Some items may interact with your medicine. What should I watch for while using this medication? Your condition will be monitored carefully while you are receiving this medication. You may need bloodwork while taking this medication. Talk to your care team about your risk of cancer. You may be more at risk for certain types of cancer if you take this medication. What side effects may I notice from receiving this medication? Side effects that you should report to your care team as soon as possible: Allergic reactions--skin rash, itching, hives, swelling of the face, lips, tongue, or throat Capillary leak syndrome--stomach or muscle pain, unusual weakness or fatigue, feeling faint or lightheaded, decrease in the amount of urine, swelling of the ankles, hands, or   feet, trouble breathing High white blood cell level--fever, fatigue, trouble breathing, night sweats, change in vision, weight loss Inflammation of the aorta--fever, fatigue, back, chest, or stomach pain, severe headache Kidney injury (glomerulonephritis)--decrease in the amount of urine, red or dark brown urine, foamy or bubbly urine, swelling of the ankles, hands, or  feet Shortness of breath or trouble breathing Spleen injury--pain in upper left stomach or shoulder Unusual bruising or bleeding Side effects that usually do not require medical attention (report to your care team if they continue or are bothersome): Back pain Bone pain Fatigue Fever Headache Nausea This list may not describe all possible side effects. Call your doctor for medical advice about side effects. You may report side effects to FDA at 1-800-FDA-1088. Where should I keep my medication? Keep out of the reach of children and pets. Keep this medication in the original packaging until you are ready to take it. Protect from light. See product for storage information. Each product may have different instructions. Get rid of any unused medication after the expiration date. To get rid of medications that are no longer needed or have expired: Take the medication to a medications take-back program. Check with your pharmacy or law enforcement to find a location. If you cannot return the medication, ask your pharmacist or care team how to get rid of this medication safely. NOTE: This sheet is a summary. It may not cover all possible information. If you have questions about this medicine, talk to your doctor, pharmacist, or health care provider.  2024 Elsevier/Gold Standard (2021-10-09 00:00:00)  

## 2023-05-04 ENCOUNTER — Inpatient Hospital Stay: Payer: 59

## 2023-05-04 VITALS — BP 112/72 | HR 99 | Temp 98.1°F | Resp 18

## 2023-05-04 DIAGNOSIS — C786 Secondary malignant neoplasm of retroperitoneum and peritoneum: Secondary | ICD-10-CM | POA: Diagnosis not present

## 2023-05-04 DIAGNOSIS — C189 Malignant neoplasm of colon, unspecified: Secondary | ICD-10-CM

## 2023-05-04 DIAGNOSIS — C7800 Secondary malignant neoplasm of unspecified lung: Secondary | ICD-10-CM

## 2023-05-04 DIAGNOSIS — C182 Malignant neoplasm of ascending colon: Secondary | ICD-10-CM

## 2023-05-04 DIAGNOSIS — C7802 Secondary malignant neoplasm of left lung: Secondary | ICD-10-CM | POA: Diagnosis not present

## 2023-05-04 DIAGNOSIS — C7971 Secondary malignant neoplasm of right adrenal gland: Secondary | ICD-10-CM | POA: Diagnosis not present

## 2023-05-04 DIAGNOSIS — Z5111 Encounter for antineoplastic chemotherapy: Secondary | ICD-10-CM | POA: Diagnosis not present

## 2023-05-04 DIAGNOSIS — Z5189 Encounter for other specified aftercare: Secondary | ICD-10-CM | POA: Diagnosis not present

## 2023-05-04 DIAGNOSIS — C7801 Secondary malignant neoplasm of right lung: Secondary | ICD-10-CM | POA: Diagnosis not present

## 2023-05-04 DIAGNOSIS — C7931 Secondary malignant neoplasm of brain: Secondary | ICD-10-CM | POA: Diagnosis not present

## 2023-05-04 DIAGNOSIS — C787 Secondary malignant neoplasm of liver and intrahepatic bile duct: Secondary | ICD-10-CM

## 2023-05-04 MED ORDER — FILGRASTIM-SNDZ 480 MCG/0.8ML IJ SOSY
480.0000 ug | PREFILLED_SYRINGE | Freq: Once | INTRAMUSCULAR | Status: AC
  Administered 2023-05-04: 480 ug via SUBCUTANEOUS
  Filled 2023-05-04: qty 0.8

## 2023-05-04 NOTE — Patient Instructions (Signed)
Filgrastim Injection What is this medication? FILGRASTIM (fil GRA stim) lowers the risk of infection in people who are receiving chemotherapy. It works by helping your body make more white blood cells, which protects your body from infection. It may also be used to help people who have been exposed to high doses of radiation. It can be used to help prepare your body before a stem cell transplant. It works by helping your bone marrow make and release stem cells into the blood. This medicine may be used for other purposes; ask your health care provider or pharmacist if you have questions. COMMON BRAND NAME(S): Neupogen, Nivestym, Releuko, Zarxio What should I tell my care team before I take this medication? They need to know if you have any of these conditions: History of blood diseases, such as sickle cell anemia Kidney disease Recent or ongoing radiation An unusual or allergic reaction to filgrastim, pegfilgrastim, latex, rubber, other medications, foods, dyes, or preservatives Pregnant or trying to get pregnant Breast-feeding How should I use this medication? This medication is injected under the skin or into a vein. It is usually given by your care team in a hospital or clinic setting. It may be given at home. If you get this medication at home, you will be taught how to prepare and give it. Use exactly as directed. Take it as directed on the prescription label at the same time every day. Keep taking it unless your care team tells you to stop. It is important that you put your used needles and syringes in a special sharps container. Do not put them in a trash can. If you do not have a sharps container, call your pharmacist or care team to get one. This medication comes with INSTRUCTIONS FOR USE. Ask your pharmacist for directions on how to use this medication. Read the information carefully. Talk to your pharmacist or care team if you have questions. Talk to your care team about the use of this  medication in children. While it may be prescribed for children for selected conditions, precautions do apply. Overdosage: If you think you have taken too much of this medicine contact a poison control center or emergency room at once. NOTE: This medicine is only for you. Do not share this medicine with others. What if I miss a dose? It is important not to miss any doses. Talk to your care team about what to do if you miss a dose. What may interact with this medication? Medications that may cause a release of neutrophils, such as lithium This list may not describe all possible interactions. Give your health care provider a list of all the medicines, herbs, non-prescription drugs, or dietary supplements you use. Also tell them if you smoke, drink alcohol, or use illegal drugs. Some items may interact with your medicine. What should I watch for while using this medication? Your condition will be monitored carefully while you are receiving this medication. You may need bloodwork while taking this medication. Talk to your care team about your risk of cancer. You may be more at risk for certain types of cancer if you take this medication. What side effects may I notice from receiving this medication? Side effects that you should report to your care team as soon as possible: Allergic reactions--skin rash, itching, hives, swelling of the face, lips, tongue, or throat Capillary leak syndrome--stomach or muscle pain, unusual weakness or fatigue, feeling faint or lightheaded, decrease in the amount of urine, swelling of the ankles, hands, or   feet, trouble breathing High white blood cell level--fever, fatigue, trouble breathing, night sweats, change in vision, weight loss Inflammation of the aorta--fever, fatigue, back, chest, or stomach pain, severe headache Kidney injury (glomerulonephritis)--decrease in the amount of urine, red or dark brown urine, foamy or bubbly urine, swelling of the ankles, hands, or  feet Shortness of breath or trouble breathing Spleen injury--pain in upper left stomach or shoulder Unusual bruising or bleeding Side effects that usually do not require medical attention (report to your care team if they continue or are bothersome): Back pain Bone pain Fatigue Fever Headache Nausea This list may not describe all possible side effects. Call your doctor for medical advice about side effects. You may report side effects to FDA at 1-800-FDA-1088. Where should I keep my medication? Keep out of the reach of children and pets. Keep this medication in the original packaging until you are ready to take it. Protect from light. See product for storage information. Each product may have different instructions. Get rid of any unused medication after the expiration date. To get rid of medications that are no longer needed or have expired: Take the medication to a medications take-back program. Check with your pharmacy or law enforcement to find a location. If you cannot return the medication, ask your pharmacist or care team how to get rid of this medication safely. NOTE: This sheet is a summary. It may not cover all possible information. If you have questions about this medicine, talk to your doctor, pharmacist, or health care provider.  2024 Elsevier/Gold Standard (2021-10-09 00:00:00)  

## 2023-05-05 ENCOUNTER — Inpatient Hospital Stay: Payer: 59

## 2023-05-05 VITALS — BP 114/74 | HR 94 | Temp 97.6°F

## 2023-05-05 DIAGNOSIS — C189 Malignant neoplasm of colon, unspecified: Secondary | ICD-10-CM | POA: Diagnosis not present

## 2023-05-05 DIAGNOSIS — C182 Malignant neoplasm of ascending colon: Secondary | ICD-10-CM

## 2023-05-05 DIAGNOSIS — C7802 Secondary malignant neoplasm of left lung: Secondary | ICD-10-CM | POA: Diagnosis not present

## 2023-05-05 DIAGNOSIS — Z5111 Encounter for antineoplastic chemotherapy: Secondary | ICD-10-CM | POA: Diagnosis not present

## 2023-05-05 DIAGNOSIS — Z5189 Encounter for other specified aftercare: Secondary | ICD-10-CM | POA: Diagnosis not present

## 2023-05-05 DIAGNOSIS — C7801 Secondary malignant neoplasm of right lung: Secondary | ICD-10-CM | POA: Diagnosis not present

## 2023-05-05 DIAGNOSIS — C7931 Secondary malignant neoplasm of brain: Secondary | ICD-10-CM | POA: Diagnosis not present

## 2023-05-05 DIAGNOSIS — C7971 Secondary malignant neoplasm of right adrenal gland: Secondary | ICD-10-CM | POA: Diagnosis not present

## 2023-05-05 DIAGNOSIS — C787 Secondary malignant neoplasm of liver and intrahepatic bile duct: Secondary | ICD-10-CM | POA: Diagnosis not present

## 2023-05-05 DIAGNOSIS — C786 Secondary malignant neoplasm of retroperitoneum and peritoneum: Secondary | ICD-10-CM | POA: Diagnosis not present

## 2023-05-05 MED ORDER — FILGRASTIM-SNDZ 480 MCG/0.8ML IJ SOSY
480.0000 ug | PREFILLED_SYRINGE | Freq: Once | INTRAMUSCULAR | Status: AC
  Administered 2023-05-05: 480 ug via SUBCUTANEOUS
  Filled 2023-05-05: qty 0.8

## 2023-05-05 NOTE — Patient Instructions (Signed)
Filgrastim Injection What is this medication? FILGRASTIM (fil GRA stim) lowers the risk of infection in people who are receiving chemotherapy. It works by helping your body make more white blood cells, which protects your body from infection. It may also be used to help people who have been exposed to high doses of radiation. It can be used to help prepare your body before a stem cell transplant. It works by helping your bone marrow make and release stem cells into the blood. This medicine may be used for other purposes; ask your health care provider or pharmacist if you have questions. COMMON BRAND NAME(S): Neupogen, Nivestym, Releuko, Zarxio What should I tell my care team before I take this medication? They need to know if you have any of these conditions: History of blood diseases, such as sickle cell anemia Kidney disease Recent or ongoing radiation An unusual or allergic reaction to filgrastim, pegfilgrastim, latex, rubber, other medications, foods, dyes, or preservatives Pregnant or trying to get pregnant Breast-feeding How should I use this medication? This medication is injected under the skin or into a vein. It is usually given by your care team in a hospital or clinic setting. It may be given at home. If you get this medication at home, you will be taught how to prepare and give it. Use exactly as directed. Take it as directed on the prescription label at the same time every day. Keep taking it unless your care team tells you to stop. It is important that you put your used needles and syringes in a special sharps container. Do not put them in a trash can. If you do not have a sharps container, call your pharmacist or care team to get one. This medication comes with INSTRUCTIONS FOR USE. Ask your pharmacist for directions on how to use this medication. Read the information carefully. Talk to your pharmacist or care team if you have questions. Talk to your care team about the use of this  medication in children. While it may be prescribed for children for selected conditions, precautions do apply. Overdosage: If you think you have taken too much of this medicine contact a poison control center or emergency room at once. NOTE: This medicine is only for you. Do not share this medicine with others. What if I miss a dose? It is important not to miss any doses. Talk to your care team about what to do if you miss a dose. What may interact with this medication? Medications that may cause a release of neutrophils, such as lithium This list may not describe all possible interactions. Give your health care provider a list of all the medicines, herbs, non-prescription drugs, or dietary supplements you use. Also tell them if you smoke, drink alcohol, or use illegal drugs. Some items may interact with your medicine. What should I watch for while using this medication? Your condition will be monitored carefully while you are receiving this medication. You may need bloodwork while taking this medication. Talk to your care team about your risk of cancer. You may be more at risk for certain types of cancer if you take this medication. What side effects may I notice from receiving this medication? Side effects that you should report to your care team as soon as possible: Allergic reactions--skin rash, itching, hives, swelling of the face, lips, tongue, or throat Capillary leak syndrome--stomach or muscle pain, unusual weakness or fatigue, feeling faint or lightheaded, decrease in the amount of urine, swelling of the ankles, hands, or   feet, trouble breathing High white blood cell level--fever, fatigue, trouble breathing, night sweats, change in vision, weight loss Inflammation of the aorta--fever, fatigue, back, chest, or stomach pain, severe headache Kidney injury (glomerulonephritis)--decrease in the amount of urine, red or dark brown urine, foamy or bubbly urine, swelling of the ankles, hands, or  feet Shortness of breath or trouble breathing Spleen injury--pain in upper left stomach or shoulder Unusual bruising or bleeding Side effects that usually do not require medical attention (report to your care team if they continue or are bothersome): Back pain Bone pain Fatigue Fever Headache Nausea This list may not describe all possible side effects. Call your doctor for medical advice about side effects. You may report side effects to FDA at 1-800-FDA-1088. Where should I keep my medication? Keep out of the reach of children and pets. Keep this medication in the original packaging until you are ready to take it. Protect from light. See product for storage information. Each product may have different instructions. Get rid of any unused medication after the expiration date. To get rid of medications that are no longer needed or have expired: Take the medication to a medications take-back program. Check with your pharmacy or law enforcement to find a location. If you cannot return the medication, ask your pharmacist or care team how to get rid of this medication safely. NOTE: This sheet is a summary. It may not cover all possible information. If you have questions about this medicine, talk to your doctor, pharmacist, or health care provider.  2024 Elsevier/Gold Standard (2021-10-09 00:00:00)  

## 2023-05-10 ENCOUNTER — Encounter: Payer: Self-pay | Admitting: Hematology and Oncology

## 2023-05-10 ENCOUNTER — Encounter: Payer: Self-pay | Admitting: Oncology

## 2023-05-11 ENCOUNTER — Encounter: Payer: Self-pay | Admitting: Oncology

## 2023-05-11 ENCOUNTER — Encounter: Payer: Self-pay | Admitting: Hematology and Oncology

## 2023-05-14 ENCOUNTER — Encounter: Payer: Self-pay | Admitting: Oncology

## 2023-05-16 NOTE — Progress Notes (Unsigned)
[facility]  [address] [place],  [place]  [address] [phone]  Clinic Day:  05/17/2023  Referring physician: Alinda Deem, MD  HISTORY OF PRESENT ILLNESS:  The patient is a 57 y.o. female with  metastatic colon cancer, which includes spread of disease to her abdominal cavity, lungs, brain and right adrenal gland.  She comes in today to be evaluated before heading into her 18th cycle of FOLFOX/Avastin.  Of note, her cycles of chemotherapy were recently spaced out to every 3 weeks.  Since her last visit, the patient has been doing okay.  She has substantial GI issues, including intermittent diarrhea.  However, she denies having any new GI symptoms which concern her for overt signs of disease progression.   With respect to her colon cancer history, she is status post a right hemicolectomy in early October 2018, followed by 12 cycles of adjuvant FOLFOX chemotherapy, which were completed in April 2019.  In December 2021, she underwent a left cerebellar metastasectomy, whose pathology was consistent with metastatic colon cancer.  Tumor testing did come back MMR normal. CT scans revealed evidence of her cancer being in multiple locations, for which she took 40 cycles of FOLFIRI/Avastin.  As follow-up scans revealed liver metastasis, she was placed back on FOLFOX/Avastin, for which she continues to take.  PHYSICAL EXAM:  Blood pressure 121/80, pulse 99, temperature 98.1 F (36.7 C), temperature source Oral, resp. rate 16, height 5\' 8"  (1.727 m), weight 153 lb (69.4 kg), last menstrual period 02/22/2019, SpO2 99%. Body mass index is 23.26 kg/m.  Performance status (ECOG): 1 - Symptomatic but completely ambulatory  Physical Exam Vitals and nursing note reviewed.  Constitutional:      General: She is not in acute distress.    Appearance: Normal appearance.  HENT:     Head: Normocephalic and atraumatic.     Mouth/Throat:     Mouth: Mucous membranes are  moist.     Pharynx: Oropharynx is clear. No oropharyngeal exudate or posterior oropharyngeal erythema.  Eyes:     General: No scleral icterus.    Extraocular Movements: Extraocular movements intact.     Conjunctiva/sclera: Conjunctivae normal.     Pupils: Pupils are equal, round, and reactive to light.  Cardiovascular:     Rate and Rhythm: Normal rate and regular rhythm.     Heart sounds: Normal heart sounds. No murmur heard.    No friction rub. No gallop.  Pulmonary:     Effort: Pulmonary effort is normal.     Breath sounds: Normal breath sounds. No wheezing, rhonchi or rales.  Abdominal:     General: There is no distension.     Palpations: Abdomen is soft. There is no hepatomegaly, splenomegaly or mass.     Tenderness: There is no abdominal tenderness.  Musculoskeletal:        General: Normal range of motion.     Cervical back: Normal range of motion and neck supple. No tenderness.     Right lower leg: No edema.     Left lower leg: No edema.  Lymphadenopathy:     Cervical: No cervical adenopathy.     Upper Body:     Right upper body: No supraclavicular or axillary adenopathy.     Left upper body: No supraclavicular or axillary adenopathy.     Lower Body: No right inguinal adenopathy. No left inguinal adenopathy.  Skin:    General: Skin is warm and dry.     Coloration: Skin  is not jaundiced.     Findings: No rash.  Neurological:     Mental Status: She is alert and oriented to person, place, and time.     Cranial Nerves: No cranial nerve deficit.  Psychiatric:        Mood and Affect: Mood normal.        Behavior: Behavior normal.        Thought Content: Thought content normal.    LABS:      Latest Ref Rng & Units 05/17/2023   11:18 AM 04/26/2023   11:44 AM 04/13/2023   12:12 PM  CBC  WBC 4.0 - 10.5 K/uL 4.2  3.2  2.6   Hemoglobin 12.0 - 15.0 g/dL 91.4  78.2  95.6   Hematocrit 36.0 - 46.0 % 38.8  36.1  34.9   Platelets 150 - 400 K/uL 55  53  59       Latest Ref  Rng & Units 05/17/2023   11:18 AM 04/26/2023   11:44 AM 04/13/2023   12:12 PM  CMP  Glucose 70 - 99 mg/dL 213  086  86   BUN 6 - 20 mg/dL 9  12  14    Creatinine 0.44 - 1.00 mg/dL 5.78  4.69  6.29   Sodium 135 - 145 mmol/L 143  141  144   Potassium 3.5 - 5.1 mmol/L 4.6  4.0  3.8   Chloride 98 - 111 mmol/L 104  105  106   CO2 22 - 32 mmol/L 28  24  26    Calcium 8.9 - 10.3 mg/dL 9.7  9.6  9.7   Total Protein 6.5 - 8.1 g/dL 6.2  6.1  6.1   Total Bilirubin <1.2 mg/dL 0.5  0.3  0.4   Alkaline Phos 38 - 126 U/L 205  180  171   AST 15 - 41 U/L 43  30  37   ALT 0 - 44 U/L 45  34  45    ASSESSMENT & PLAN:  Assessment/Plan:  A 57 y.o. female with metastatic colon cancer.  She will proceed with her 18th cycle of FOLFOX/Avastin today.  As mentioned previously, all of her chemotherapy has been spaced out to every 3 weeks.  Clinically, she seems to be doing fairly well.  I will see her back in 3 weeks before she heads into her 19th cycle of FOLFOX/Avastin. The patient understands all the plans discussed today and is in agreement with them.  Annelle Behrendt Kirby Funk, MD

## 2023-05-17 ENCOUNTER — Inpatient Hospital Stay: Payer: 59

## 2023-05-17 ENCOUNTER — Inpatient Hospital Stay (HOSPITAL_BASED_OUTPATIENT_CLINIC_OR_DEPARTMENT_OTHER): Payer: 59 | Admitting: Oncology

## 2023-05-17 VITALS — BP 121/80 | HR 99 | Temp 98.1°F | Resp 16 | Ht 68.0 in | Wt 153.0 lb

## 2023-05-17 DIAGNOSIS — C189 Malignant neoplasm of colon, unspecified: Secondary | ICD-10-CM | POA: Diagnosis not present

## 2023-05-17 DIAGNOSIS — C182 Malignant neoplasm of ascending colon: Secondary | ICD-10-CM

## 2023-05-17 DIAGNOSIS — Z5189 Encounter for other specified aftercare: Secondary | ICD-10-CM | POA: Diagnosis not present

## 2023-05-17 DIAGNOSIS — C787 Secondary malignant neoplasm of liver and intrahepatic bile duct: Secondary | ICD-10-CM | POA: Diagnosis not present

## 2023-05-17 DIAGNOSIS — C7801 Secondary malignant neoplasm of right lung: Secondary | ICD-10-CM | POA: Diagnosis not present

## 2023-05-17 DIAGNOSIS — C7802 Secondary malignant neoplasm of left lung: Secondary | ICD-10-CM | POA: Diagnosis not present

## 2023-05-17 DIAGNOSIS — C7931 Secondary malignant neoplasm of brain: Secondary | ICD-10-CM | POA: Diagnosis not present

## 2023-05-17 DIAGNOSIS — C7971 Secondary malignant neoplasm of right adrenal gland: Secondary | ICD-10-CM | POA: Diagnosis not present

## 2023-05-17 DIAGNOSIS — C786 Secondary malignant neoplasm of retroperitoneum and peritoneum: Secondary | ICD-10-CM | POA: Diagnosis not present

## 2023-05-17 DIAGNOSIS — Z5111 Encounter for antineoplastic chemotherapy: Secondary | ICD-10-CM | POA: Diagnosis not present

## 2023-05-17 LAB — CBC WITH DIFFERENTIAL (CANCER CENTER ONLY)
Abs Immature Granulocytes: 0.02 10*3/uL (ref 0.00–0.07)
Basophils Absolute: 0 10*3/uL (ref 0.0–0.1)
Basophils Relative: 1 %
Eosinophils Absolute: 0.1 10*3/uL (ref 0.0–0.5)
Eosinophils Relative: 1 %
HCT: 38.8 % (ref 36.0–46.0)
Hemoglobin: 12.4 g/dL (ref 12.0–15.0)
Immature Granulocytes: 1 %
Immature Platelet Fraction: 2.3 % (ref 1.2–8.6)
Lymphocytes Relative: 28 %
Lymphs Abs: 1.2 10*3/uL (ref 0.7–4.0)
MCH: 33.3 pg (ref 26.0–34.0)
MCHC: 32 g/dL (ref 30.0–36.0)
MCV: 104.3 fL — ABNORMAL HIGH (ref 80.0–100.0)
Monocytes Absolute: 0.5 10*3/uL (ref 0.1–1.0)
Monocytes Relative: 13 %
Neutro Abs: 2.4 10*3/uL (ref 1.7–7.7)
Neutrophils Relative %: 56 %
Platelet Count: 55 10*3/uL — ABNORMAL LOW (ref 150–400)
RBC: 3.72 MIL/uL — ABNORMAL LOW (ref 3.87–5.11)
RDW: 16.8 % — ABNORMAL HIGH (ref 11.5–15.5)
WBC Count: 4.2 10*3/uL (ref 4.0–10.5)
nRBC: 0 % (ref 0.0–0.2)
nRBC: 0 /100{WBCs}

## 2023-05-17 LAB — CMP (CANCER CENTER ONLY)
ALT: 45 U/L — ABNORMAL HIGH (ref 0–44)
AST: 43 U/L — ABNORMAL HIGH (ref 15–41)
Albumin: 3.7 g/dL (ref 3.5–5.0)
Alkaline Phosphatase: 205 U/L — ABNORMAL HIGH (ref 38–126)
Anion gap: 11 (ref 5–15)
BUN: 9 mg/dL (ref 6–20)
CO2: 28 mmol/L (ref 22–32)
Calcium: 9.7 mg/dL (ref 8.9–10.3)
Chloride: 104 mmol/L (ref 98–111)
Creatinine: 1.33 mg/dL — ABNORMAL HIGH (ref 0.44–1.00)
GFR, Estimated: 47 mL/min — ABNORMAL LOW (ref >60)
Glucose, Bld: 114 mg/dL — ABNORMAL HIGH (ref 70–99)
Potassium: 4.6 mmol/L (ref 3.5–5.1)
Sodium: 143 mmol/L (ref 135–145)
Total Bilirubin: 0.5 mg/dL (ref <1.2)
Total Protein: 6.2 g/dL — ABNORMAL LOW (ref 6.5–8.1)

## 2023-05-17 LAB — TOTAL PROTEIN, URINE DIPSTICK: Protein, ur: NEGATIVE mg/dL

## 2023-05-17 MED ORDER — SODIUM CHLORIDE 0.9 % IV SOLN: Freq: Once | INTRAVENOUS | Status: AC

## 2023-05-17 MED ORDER — SODIUM CHLORIDE 0.9 % IV SOLN
2400.0000 mg/m2 | INTRAVENOUS | Status: DC
  Administered 2023-05-17: 4350 mg via INTRAVENOUS
  Filled 2023-05-17: qty 87

## 2023-05-17 MED ORDER — DIPHENHYDRAMINE HCL 50 MG/ML IJ SOLN
25.0000 mg | Freq: Once | INTRAMUSCULAR | Status: AC
  Administered 2023-05-17: 25 mg via INTRAVENOUS
  Filled 2023-05-17: qty 1

## 2023-05-17 MED ORDER — SODIUM CHLORIDE 0.9% FLUSH: 10.0000 mL | INTRAVENOUS | Status: DC | PRN

## 2023-05-17 MED ORDER — HEPARIN SOD (PORK) LOCK FLUSH 100 UNIT/ML IV SOLN: 500.0000 [IU] | Freq: Once | INTRAVENOUS | Status: DC | PRN

## 2023-05-17 MED ORDER — FAMOTIDINE IN NACL 20-0.9 MG/50ML-% IV SOLN
20.0000 mg | Freq: Once | INTRAVENOUS | Status: AC
  Administered 2023-05-17: 20 mg via INTRAVENOUS
  Filled 2023-05-17: qty 50

## 2023-05-17 MED ORDER — SODIUM CHLORIDE 0.9 % IV SOLN
150.0000 mg | Freq: Once | INTRAVENOUS | Status: AC
  Administered 2023-05-17: 150 mg via INTRAVENOUS
  Filled 2023-05-17: qty 150

## 2023-05-17 MED ORDER — FLUOROURACIL CHEMO INJECTION 2.5 GM/50ML
400.0000 mg/m2 | Freq: Once | INTRAVENOUS | Status: AC
  Administered 2023-05-17: 700 mg via INTRAVENOUS
  Filled 2023-05-17: qty 14

## 2023-05-17 MED ORDER — PALONOSETRON HCL INJECTION 0.25 MG/5ML
0.2500 mg | Freq: Once | INTRAVENOUS | Status: AC
  Administered 2023-05-17: 0.25 mg via INTRAVENOUS
  Filled 2023-05-17: qty 5

## 2023-05-17 MED ORDER — DEXTROSE 5 % IV SOLN
63.7500 mg/m2 | Freq: Once | INTRAVENOUS | Status: AC
  Administered 2023-05-17: 115 mg via INTRAVENOUS
  Filled 2023-05-17: qty 20

## 2023-05-17 MED ORDER — SODIUM CHLORIDE 0.9 % IV SOLN
5.0000 mg/kg | Freq: Once | INTRAVENOUS | Status: AC
  Administered 2023-05-17: 350 mg via INTRAVENOUS
  Filled 2023-05-17: qty 14

## 2023-05-17 MED ORDER — DEXAMETHASONE SODIUM PHOSPHATE 10 MG/ML IJ SOLN
10.0000 mg | Freq: Once | INTRAMUSCULAR | Status: AC
  Administered 2023-05-17: 10 mg via INTRAVENOUS
  Filled 2023-05-17: qty 1

## 2023-05-17 MED ORDER — DEXTROSE 5 % IV SOLN: Freq: Once | INTRAVENOUS | Status: AC

## 2023-05-17 NOTE — Patient Instructions (Signed)
The chemotherapy medication bag should finish at 46 hours, 96 hours, or 7 days. For example, if your pump is scheduled for 46 hours and it was put on at 4:00 p.m., it should finish at 2:00 p.m. the day it is scheduled to come off regardless of your appointment time.     Estimated time to finish at 2pm .   If the display on your pump reads "Low Volume" and it is beeping, take the batteries out of the pump and come to the cancer center for it to be taken off.   If the pump alarms go off prior to the pump reading "Low Volume" then call [phone] and someone can assist you.  If the plunger comes out and the chemotherapy medication is leaking out, please use your home chemo spill kit to clean up the spill. Do NOT use paper towels or other household products.  If you have problems or questions regarding your pump, please call either [phone] (24 hours a day) or the cancer center Monday-Friday 8:00 a.m.- 4:30 p.m. at the clinic number and we will assist you. If you are unable to get assistance, then go to the nearest Emergency Department and ask the staff to contact the IV team for assistance.

## 2023-05-17 NOTE — Progress Notes (Signed)
To treat with platelets of 55 per Dr. Melvyn Neth.

## 2023-05-18 ENCOUNTER — Encounter: Payer: Self-pay | Admitting: Oncology

## 2023-05-19 ENCOUNTER — Inpatient Hospital Stay: Payer: 59

## 2023-05-19 ENCOUNTER — Encounter: Payer: Self-pay | Admitting: Oncology

## 2023-05-19 VITALS — BP 110/75 | HR 99 | Temp 98.4°F | Resp 18 | Wt 152.0 lb

## 2023-05-19 DIAGNOSIS — C7801 Secondary malignant neoplasm of right lung: Secondary | ICD-10-CM | POA: Diagnosis not present

## 2023-05-19 DIAGNOSIS — Z5111 Encounter for antineoplastic chemotherapy: Secondary | ICD-10-CM | POA: Diagnosis not present

## 2023-05-19 DIAGNOSIS — C787 Secondary malignant neoplasm of liver and intrahepatic bile duct: Secondary | ICD-10-CM

## 2023-05-19 DIAGNOSIS — C189 Malignant neoplasm of colon, unspecified: Secondary | ICD-10-CM

## 2023-05-19 DIAGNOSIS — C182 Malignant neoplasm of ascending colon: Secondary | ICD-10-CM

## 2023-05-19 DIAGNOSIS — Z5189 Encounter for other specified aftercare: Secondary | ICD-10-CM | POA: Diagnosis not present

## 2023-05-19 DIAGNOSIS — C7971 Secondary malignant neoplasm of right adrenal gland: Secondary | ICD-10-CM | POA: Diagnosis not present

## 2023-05-19 DIAGNOSIS — C786 Secondary malignant neoplasm of retroperitoneum and peritoneum: Secondary | ICD-10-CM | POA: Diagnosis not present

## 2023-05-19 DIAGNOSIS — C7800 Secondary malignant neoplasm of unspecified lung: Secondary | ICD-10-CM

## 2023-05-19 DIAGNOSIS — C7802 Secondary malignant neoplasm of left lung: Secondary | ICD-10-CM | POA: Diagnosis not present

## 2023-05-19 DIAGNOSIS — C7931 Secondary malignant neoplasm of brain: Secondary | ICD-10-CM | POA: Diagnosis not present

## 2023-05-19 MED ORDER — SODIUM CHLORIDE 0.9% FLUSH
10.0000 mL | INTRAVENOUS | Status: DC | PRN
  Administered 2023-05-19: 10 mL

## 2023-05-19 MED ORDER — HEPARIN SOD (PORK) LOCK FLUSH 100 UNIT/ML IV SOLN
500.0000 [IU] | Freq: Once | INTRAVENOUS | Status: AC | PRN
Start: 2023-05-19 — End: 2023-05-19
  Administered 2023-05-19: 500 [IU]

## 2023-05-19 NOTE — Patient Instructions (Signed)
Managing Chemotherapy Side Effects, Adult Chemotherapy is a treatment that uses medicine to kill cancer cells. However, in addition to killing cancer cells, the medicines can also damage healthy cells. The damage to healthy cells can lead to side effects. The exact side effects depend on the specific medicines used. Most of the side effects of chemotherapy go away once treatment is finished. Until then, work closely with your health care providers and take an active role in managing your side effects. What are common side effects of chemotherapy? Increased risk of infection, bruising, or bleeding. Nausea and vomiting. Constipation or diarrhea. Loss of appetite. Hair loss. Mouth or throat sores. Tiredness (fatigue). Tingling, pain, or numbness in the hands and feet. Dry, sensitive, itchy, or sore skin. Sleep disturbances, such as excessive sleepiness. Confusion, anxiety, or mood swings. Memory changes. How to manage the side effects of chemotherapy Medicines Take over-the-counter and prescription medicines only as told by your health care provider. Talk with your health care provider before taking vitamins, herbs, supplements, or over-the-counter medicines. Some of these can interfere with chemotherapy. Activity Get plenty of rest. Get regular exercise by doing activities such as walking, gentle yoga, or tai chi. Return to your normal activities as told by your health care provider. Ask your health care provider what activities are safe for you. Eating and drinking  Talk to a dietitian about what you should eat and drink during cancer treatment. Drink enough fluid to keep your urine pale yellow. If you have side effects that affect eating, these tips may help: Eat smaller meals and snacks often. Drink high-nutrition and high-calorie shakes or supplements. Choose bland and soft foods that are easy to eat. Do not eat foods that are hot, spicy, or hard to swallow. Do not eat raw or  undercooked meat, eggs, or seafood. Always wash fresh fruits and vegetables well before eating them. Skin care If you have sore or itchy skin: Wear soft, comfortable clothing. Apply creams and ointments to your skin as told by your health care provider. If you lose your hair, consider wearing a wig, hat, or scarf to cover your head. You may want to have someone shave your head as you start to lose hair. During outdoor activities, protect your head and skin from the sun by using sunscreen with an SPF of 30 or higher or by wearing protective clothing and a hat. Meet with a hair and skin care specialist for makeup and skin care tips. Apply sunscreen to your scalp as told by your health care provider. General tips Learn as much as you can about your condition. If you are struggling emotionally, talk with a mental health care provider or join a support group. Keep all follow-up visits. This is important. How to prevent infection and bleeding Chemotherapy may lower your blood counts and put you at risk for infection and bleeding. Here are some ways to help prevent problems. Vaccines Talk to your health care provider about vaccines. You should not get any live vaccines, such as the polio, MMR, chickenpox, and shingles vaccines until your health care provider says that it is safe to do so. Do not be around people who have had live vaccines for as long as your health care provider recommends. Make sure you get a yearly flu shot. People who will be near you should also get a yearly flu shot. Social activity Stay away from crowded places where you could be exposed to germs. Do not be around people who may be sick or  people who have symptoms of a fever until they have been fever-free for at least 24 hours. Do not share food, cups, straws, or utensils with other people. Wear a mask when outside the home if your blood counts are low. Cleanliness  Wash your hands often for at least 20 seconds. Also make  sure that other members of your household wash their hands often. Brush your teeth twice daily using a soft toothbrush. Use mouth rinse only as told by your health care provider. Take a bath or shower daily unless your health care provider gives different instructions. General tips Take your temperature regularly, especially if you have chills or feel warm. Check with your health care provider: Before you travel. Before you have a dental procedure. Before you use a swimming pool, hot tub, or swim in a lake or ocean. If you get chemotherapy through an IV or port, check the site every day for signs of infection. Check for redness, swelling, pain, fluid, and warmth. Avoid activities that put you at risk for injury or bleeding. Use an electric razor to shave instead of a blade. Questions to ask your health care provider What are the most common side effects of my treatment? How will they affect my daily life? What can I do to manage them? What are some possible long-term side effects? What are possible complications? What support services are available? What number can I call with questions or concerns? Where to find support Cancer affects the entire family. Find out what family support resources are available from your cancer treatment center. For more support, turn to: Your cancer care team. Friends and family. Your religious community. Other people with cancer. Community-based or online support groups. Where to find more information National Cancer Institute: www.cancer.gov American Cancer Society: www.cancer.org Contact a health care provider if: You bleed or bruise more often. You notice blood in your urine or stool. You have any of these symptoms: A skin rash, or dry or itchy skin. A headache or stiff neck. Cold or flu symptoms. A cough. Persistent nausea or vomiting. Persistent diarrhea. Frequent urination, burning when passing urine, or foul-smelling urine. You cannot eat  because of mouth or throat pain. You are sad, confused, anxious, or depressed. Get help right away if: You have any of these symptoms: A fever or chills. Your health care provider should know about this right away. Redness, swelling, pain, fluid, or warmth near an IV site. Bleeding that you cannot stop. A seizure. You cannot swallow. You have chest pain. You have trouble breathing. A family member or caregiver should get help right away if you have a sudden or unusual change in behavior. These symptoms may be an emergency. Get help right away. Call 911. Do not wait to see if the symptoms will go away. Do not drive yourself to the hospital. Summary Chemotherapy is a treatment that uses medicine to kill cancer cells and can cause side effects. The specific side effects depend on the specific medicines used. Learn as much as you can about your condition. Ask about side effects to watch for and how to treat them. Seek out support and resources from others. Find out what family support resources are available from your cancer treatment center. Let your health care provider know if you notice any new, unusual, or worsening symptoms, especially fever or chills. This information is not intended to replace advice given to you by your health care provider. Make sure you discuss any questions you have with your health  care provider. Document Revised: 05/08/2021 Document Reviewed: 05/08/2021 Elsevier Patient Education  2024 [employer].

## 2023-05-20 ENCOUNTER — Encounter: Payer: Self-pay | Admitting: Psychiatry

## 2023-05-20 ENCOUNTER — Ambulatory Visit (INDEPENDENT_AMBULATORY_CARE_PROVIDER_SITE_OTHER): Payer: 59 | Admitting: Psychiatry

## 2023-05-20 DIAGNOSIS — G3184 Mild cognitive impairment, so stated: Secondary | ICD-10-CM

## 2023-05-20 DIAGNOSIS — R530 Neoplastic (malignant) related fatigue: Secondary | ICD-10-CM | POA: Diagnosis not present

## 2023-05-20 DIAGNOSIS — F333 Major depressive disorder, recurrent, severe with psychotic symptoms: Secondary | ICD-10-CM

## 2023-05-20 DIAGNOSIS — F422 Mixed obsessional thoughts and acts: Secondary | ICD-10-CM | POA: Diagnosis not present

## 2023-05-20 DIAGNOSIS — G4721 Circadian rhythm sleep disorder, delayed sleep phase type: Secondary | ICD-10-CM | POA: Diagnosis not present

## 2023-05-20 MED ORDER — CARIPRAZINE HCL 3 MG PO CAPS: 3.0000 mg | ORAL_CAPSULE | Freq: Every day | ORAL | 0 refills | Status: DC

## 2023-05-20 MED ORDER — AUVELITY 45-105 MG PO TBCR: EXTENDED_RELEASE_TABLET | ORAL | 1 refills | Status: DC

## 2023-05-20 NOTE — Patient Instructions (Signed)
Stop Wellbutrin and start Auvelity 1 in the AM for 3-4 days, then increase to 1 in the AM and 1 in the PM

## 2023-05-20 NOTE — Progress Notes (Signed)
Brandolyn Phann [id number] 23-Mar-1966 57 y.o.    Subjective:   Patient ID:  Shannon [name] is a 57 y.o. (DOB Aug 02, 1965) female.  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  seen November 2020 .  No meds were changed at that time.  10/16/19 the following is noted at this appt: Asked questions about sleep supplement.   Just started GI doctor.   Good overall. No mood swings. No depressive episodes.  Cry more with hormonal changes more easily.   Probably sleep too much to avoid.  Occ stress from work interferes with sleep.  Not as productive from home.  Hopes to get back to the office.  Still working from home.  Boss noticed her forgetfulness with deadlines and missing issues in emails.  Used to be sharper. Wonders if related to meds.  Can usually nap if desired for years and not seasonal.  Only anxiety is Covid related. More poor self care.   Hysterectomy December 2020. No med changes  03/18/2020 appointment with the following noted: July had Covid and both parents and her father died from [place]. Able to help mom with arrangements. Required one iron infusion this year. Never took NAC bc it was too hard to take. Sleep, eating schedule is irregular. More productive at home helps her feel better. Plan: No med changes. 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 For a month had dizzy, falls, NV all of which cleared quickly after removal.  Radiation to brain after this. More CA in lungs and abdomen and having chemo.  Oncologist said she has about 5 years of lifespan left.  MRI and CT pending. Gotten  into therapy starting Jonny Ruiz Rodenbaugh next week.   Pray a lot and good connection with friends.  Good support.  Trying to do things for fun.   Is working but finding it difficult mentally and physically DT SE fatigue from chemo. Considering LT disability but $ concerns.   If anxious trouble falling asleep.   Works from home.   D 55 yo.   Plan: No med changes except added modafinil  11/26/2020 appointment with the following noted: Added modafinil and didn't notice much from it. Sleep average about 7 hours.  Would prefer 10 hours and may nap here and there in week of chemo even on chemo.  Had to get it at [facility] with [facility]. No dx of OSA. Last 2 weeks a little low emotionally and mentally after up for a couple of weeks with company and it wore her out. Sleepiness is normally worse than tiredness. Only situational mood effects. Plan: No benefit 200 mg modafinil nor SE therefore Increase Modafinil to 300 mg daily for excessive sleepiness  04/21/2021 appointment with the following noted: Still dealing with chemotx for colon cancer Thinks increase modafinil questionable but misses it more than takes it. No SE. Things are getting harder rough month with more depression and anxiety related to cancer and daughter.  Work is hard.  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  Missing deadlines.   Needs to call social security. D 55 yo and in  therapy with Elio Forget.  So angry with patient. Has been able to stay on branded Seroquel. Tumors on CT scan  are shrinking recently. Plan: HX RELAPSE PSYCHOSIS WITH GENERIC QUETIAPINE, Continue branded Seroquel 800 mg HS and lamotrigine 150 mg daily and Luvox 300 mg daily Vraylar 1.5 mg daily for 2 weeks, Then if tolerated increase to 3 mg daily. If mood improves then reduce Seroquel to 1 and 1/2 tablets.  06/06/2021 appt noted: I like it.  More clear headed and better energy.  Missed a few bc out of samples. Reduced Seroquel 600 mg  HS Less depressed.  Especially right after starting and others noticed the benefit. Plan : Continue lamotrigine 150 mg daily and Luvox 300 mg daily Vraylar 3 mg daily. Reduce Seroquel to 400 mg nightly for 2 weeks,  Then reduce Seroquel to 200 mg nightly for 2 weeks,  Then reduce to 100 mg for 2 weeks, Then reduce to 50 mg 2 weeks then stop if you can still sleep ok  08/07/2021 appointment with the following noted: Continues Vraylar 3 mg and Luvox 300 mg daily.  Reduced Seroquel to 200 mg nightly and takes Ambien 5 mg nightly Went slowly down with Seroquel. Doing pretty good.  Some night hard to go to sleep but usally ok.   Still please with Vraylar less sedated. No mood swings.  Some struggle with depression with reduced motivation and enjoyment.  Sometimes bored and would rather go to bed.  Don't want to read or watch TV.   Can follow a show but doesn't find it intersting.  CT scan show tumors shrinking. Plan: Continue lamotrigine 150 mg daily and Luvox 300 mg daily Reduce Vraylar 3 mg every other day to see If depression energy and interest and enjoyment is better Reduce Seroquel to 150 for 2 weeks Then reduce to 100 mg for 2 weeks, Then reduce to 50 mg 2 weeks then stop if you can still sleep ok Wants to have access to Ambien Plan: No benefit 200 mg modafinil nor SE therefore OK continue trial  Modafinil to 300 mg daily for excessive sleepiness  10/30/2021 appointment with the following noted: Reduced quetiapine to 100 mg and trouble sleeping so back to 150 mg HS Fine with reduced Vraylar to 3 mg QOD Increase modafinil to 300 mg and is more alert and more active. No SE Overall mood is ok but still likes to resto often but not sleeping as much.  Not sure if it is chemo related.  Will rest after activity. At night sleeps 10 hours. Still hard to enjoy things and part of reason will lay down bc is bored.   Changed TV cable and not watching much now.  Can't concentrate to read well.  Enjoys  dog.  Enjoying some projects at home.  Patient denies difficulty with sleep maintenance.   Denies appetite disturbance.  Patient reports that motivation have been good.  Patient denies any suicidal ideation.  Still too monotone. This is last week at work.  Not doing well at work DT concentration.  Will apply for disability. Ativan only every few weeks. Plan: Continue lamotrigine 150 mg daily  Continue Vraylar 3 mg every other day  Try again to Reduce Seroquel to 100 mg for 2 weeks, Then reduce to 50 mg 2 weeks then stop if you can still sleep ok Wants to have access to Ambien Reduce Luvox to 2 and 1/2 tablets Start Wellbutrin in the AM 1 for 1 week then 2 or  300 mg AM  12/22/21 appt noted: Reduced quetiapine to 50 and was OK but when stopped couldn't sleep.  Less daytime drowsy with less.   Reduced fluvoxamine to 250 and started Wellbutrin XL 300 mg AM. More even keel level and less down.  More content.  More steady.  Less teary and emotional. No SE problems with change. ? Modafinil in hopes of better concentration and clarity but ? Effect. Has some trouble with conc but it varies. No increase anxiety or jitteriness. Plan: Continue lamotrigine 150 mg daily  Continue Vraylar 3 mg every other day  Try again to Reduce Seroquel to 25-50 mg HS Wants to have access to Ambien Continue Luvox to 2 and 1/2 tablets Increase Wellbutrin to 450 mg AM residual depression anhedonia  03/11/2022 appointment noted Increase Wellbutrin to 450 mg AM residual depression anhedonia is partially better.  Not as heavy and cloudy.  Energy a little better until last 2 chemos wiped her out more with nausea for 6 days.  Chemo every 3 weeks. Enjoys her dog and helps her get up.   No SE with Wellbutrin.   Minimal anxiety usually.  Except a couple of days per week.  Anxiety over bills.   No sig obsessions.   Problems with daughter are stressful.  D drinks too much and sleeps all day and has social problems and  irresponsibility. D Not working.  She is 55 yo and very needy and childlike emotionally. Reduced Seroquel to 25 mg HS and willing to try to stop.  Avg 10 hour and some naps. But no longer hangover without much Seroquel. Has taken more Ambien. Plan: HX RELAPSE PSYCHOSIS WITH GENERIC QUETIAPINE, Continue lamotrigine 150 mg daily  Continue Vraylar 3 mg every other day  Try again to Reduce Seroquel if possible to 0 or  25 mg HS Wants to have access to Ambien Continue Luvox to 2 and 1/2 tablets Continue  Wellbutrin to 450 mg AM residual depression anhedonia OK continue Modafinil to 300 mg daily for excessive sleepiness and cancer related fatigue  10/15/22 appt noted: Meds as above and off Seroquel.  Rare Ambien.    No sig SE  Lately sleeping ok.  Mouth guard helped clenching of mouth.  Occ night of barely sleeping but better with mouth guard. Mood is ok . Anxiety high DT stressors.  Talking with friends family helps with stress.   Primary stressors CA and D 55 yo.   Disc sleep.   No sig obsessions.  No panic she can't handle.   Plan: Asks about trying to reduce some of the meds.  Disc options of fluvoxamine vs lamotrigine. Reduce lamotrigine to 1 of the 100 mg tablets for 1 month then reduce to 1/2 of the 100 mg for 2 weeks and if you feel ok stop it. Continue Vraylar 3 mg every other day  Off Seroquel Wants to have access to Ambien Continue Luvox to 2 and 1/2 tablets Continue  Wellbutrin to 450 mg AM residual depression anhedonia OK continue Modafinil to 300 mg daily for excessive sleepiness and cancer related fatigue  01/19/23 appt noted: Couldn't tell a difference off the lamotrigine. Meds: fluvox 250, lorazepam 0.5 rare, modafinil 300, Vraylar 3 mg every other day, Wellbutrin XL 450 AM, off lamotrigine. No Ambien Pretty good, stable, even keel.  Mild dep sx chronically.   No sig panic or anxity. Sleep variable.  Trouble staying asleep.  Naps an hour or more twice daily. 5-6 hours sleep  at night.  Still feels drowsy in the day and even driving.   No SE Plan: Continue Vraylar 3 mg every other day  Wants to have access to Ambien but doesn't keep her asleep.  Continue Luvox to 2 and 1/2 tablets Continue  Wellbutrin to 450 mg AM residual depression anhedonia OK continue Modafinil to 300 mg daily for excessive sleepiness and cancer related fatigue Disc EFA, trial trazodone 50-100 mg HS  05/20/23 appt noted: Meds as above: trazodone 50 HS, others as noted. Still some nights of not sleeping even with trazodone every 2-3 weeks. Overall pretty good but still some dep and anxiety over the cancer and life.  Cancer treatment effectiveness is going well. Needs to do better to find things that engage her mind and the dep is holding her back in that area. D is still a big stress and is dependent onher.    Past Psychiatric Medication Trials: HX RELAPSE PSYCHOSIS WITH GENERIC QUETIAPINE,  Seroquel 800, Risperdal 2, inVega 3, Abilify 15, Vraylar 3 Citalopram, fluoxetine 80, sertraline to 50, fluvoxamine 300 mg daily lamotrigine 200,  Modafinil 300 Ambien. No Lunesta,  Has been under the care of the psychiatrist since March 2000  D seeing Elio Forget  Review of Systems:  Review of Systems  Constitutional:  Positive for fatigue.  HENT:  Negative for rhinorrhea.   Eyes:  Negative for discharge.  Cardiovascular:  Negative for palpitations.  Gastrointestinal:  Positive for constipation. Negative for abdominal distention.  Neurological:  Positive for weakness. Negative for tremors.       No history of SZ  Psychiatric/Behavioral:  Positive for decreased concentration and dysphoric mood. The patient is nervous/anxious.     Medications: I have reviewed the patient's current medications.  Current Outpatient Medications  Medication Sig Dispense Refill   acetaminophen (TYLENOL) 325 MG tablet Take 2 tablets (650 mg total) by mouth every 6 (six) hours as needed for mild pain.      atorvastatin (LIPITOR) 10 MG tablet Take by mouth.     CONTOUR NEXT TEST test strip 3 (three) times daily.     Dextromethorphan-buPROPion ER (AUVELITY) 45-105 MG TBCR 1 in the AM for 1 week then 1 twice daily 60 tablet 1   Dulaglutide (TRULICITY) 3 MG/0.5ML SOAJ Inject 3 mg into the skin once a week. 6 mL 1   Dulaglutide (TRULICITY) 3 MG/0.5ML SOPN Inject 3 mg into the skin once weekly. 6 mL 1   esomeprazole (NEXIUM) 40 MG capsule Take 40 mg by mouth daily at 12 noon.     fluvoxaMINE (LUVOX) 100 MG tablet TAKE 3 TABLETS (300 MG TOTAL) BY MOUTH AT BEDTIME 90 tablet 1   hyoscyamine (LEVSIN SL) 0.125 MG SL tablet Place 1 tablet (0.125 mg total) under the tongue every 6 (six) hours as needed. 45 tablet 1   insulin aspart (NOVOLOG FLEXPEN) 100 UNIT/ML FlexPen Inject 8 Units into the skin 3 (three) times daily with meals. 15 mL 11   insulin glargine (LANTUS) 100 UNIT/ML Solostar Pen Inject 26 Units into the skin daily. 15 mL 11   levothyroxine (SYNTHROID) 75 MCG tablet Take 1 tablet (75 mcg total) by mouth daily. 30 tablet 0   loratadine (CLARITIN) 10 MG tablet Take 10 mg by mouth daily.     magic mouthwash (nystatin, lidocaine, diphenhydrAMINE, alum & mag hydroxide) suspension Swish and spit 5-10 mLs 3 (three) times daily. Do not eat or drink for 1 hour after using. 240 mL 0   magic mouthwash SOLN Take 10 mLs  by mouth 3 (three) times daily as needed for mouth pain.     modafinil (PROVIGIL) 200 MG tablet **DNF 5/30** TAKE 1 & 1/2 (ONE & ONE-HALF) TABLETS BY MOUTH ONCE DAILY 135 tablet 0   Multiple Vitamin (MULTI-VITAMIN) tablet Take 1 tablet by mouth daily.     NOVOFINE PEN NEEDLE 32G X 6 MM MISC SMARTSIG:Syringe(s) SUB-Q     omeprazole (PRILOSEC) 20 MG capsule Take 20 mg by mouth daily.     ondansetron (ZOFRAN) 4 MG tablet Take 1 tablet (4 mg total) by mouth every 4 (four) hours as needed for nausea. 90 tablet 3   ondansetron (ZOFRAN-ODT) 4 MG disintegrating tablet Take 1 tablet (4 mg total) by mouth  every 4 (four) hours as needed for nausea or vomiting. Dissolve on tongue 90 tablet 3   promethazine (PHENERGAN) 25 MG tablet Take 1 tablet (25 mg total) by mouth every 6 (six) hours as needed for nausea. 30 tablet 3   senna-docusate (SENOKOT-S) 8.6-50 MG tablet Take 2 tablets by mouth at bedtime.     sucralfate (CARAFATE) 1 g tablet Take 1 g by mouth 4 (four) times daily.     traZODone (DESYREL) 50 MG tablet 1-2 TABLETS AT NIGHT AS NEEDED FOR SLEEP 180 tablet 1   TRULICITY 3 MG/0.5ML SOPN Inject into the skin.     cariprazine (VRAYLAR) 3 MG capsule Take 1 capsule (3 mg total) by mouth daily. 90 capsule 0   LORazepam (ATIVAN) 0.5 MG tablet Take 1 tablet (0.5 mg total) by mouth every 8 (eight) hours as needed for anxiety. (Patient not taking: Reported on 05/20/2023) 30 tablet 3   zolpidem (AMBIEN) 5 MG tablet Take 1 tablet (5 mg total) by mouth at bedtime as needed for sleep. (Patient not taking: Reported on 05/20/2023) 30 tablet 3   No current facility-administered medications for this visit.    Medication Side Effects: None  Allergies:  Allergies  Allergen Reactions   Leucovorin Hives, Rash and Other (See Comments)    Redness on face and neck.  Confirmed that it was the leucovorin causing this on 11/19/21.  We are deleting this from future plan.   Clindamycin/Lincomycin Rash   Irinotecan Rash   Penicillins Rash    Reaction: 10 years    Past Medical History:  Diagnosis Date   Anemia    Iron De   Anxiety    Blood clot in vein    Bowel obstruction (HCC)    Colon cancer (HCC) 2018   treated with surgery and chemotherapy   Depression    Diabetes mellitus without complication (HCC)    Type II   DVT (deep venous thrombosis) (HCC) 2019   behind right knee- while she wasa on chemo   GERD (gastroesophageal reflux disease)    History of blood transfusion    History of chemotherapy    History of kidney stones 2012   passed   Hypothyroidism    Obesity     Family History  Problem  Relation Age of Onset   Irritable bowel syndrome Mother    Colon polyps Father    Breast cancer Maternal Grandmother    Diabetes Maternal Grandmother    Diabetes Maternal Grandfather    Breast cancer Paternal Grandmother    Colon polyps Maternal Uncle    Stomach cancer Neg Hx    Pancreatic cancer Neg Hx    Esophageal cancer Neg Hx    Colon cancer Neg Hx     Social History   Socioeconomic  History   Marital status: Widowed    Spouse name: Not on file   Number of children: Not on file   Years of education: Not on file   Highest education level: Not on file  Occupational History   Occupation: care coordinator   Tobacco Use   Smoking status: Never   Smokeless tobacco: Never  Vaping Use   Vaping status: Never Used  Substance and Sexual Activity   Alcohol use: Yes    Comment: rare   Drug use: Never   Sexual activity: Not Currently  Other Topics Concern   Not on file  Social History Narrative   Not on file   Social Drivers of Health   Financial Resource Strain: Not on file  Food Insecurity: Not on file  Transportation Needs: Not on file  Physical Activity: Not on file  Stress: Not on file  Social Connections: Not on file  Intimate Partner Violence: Not on file    Past Medical History, Surgical history, Social history, and Family history were reviewed and updated as appropriate.   Please see review of systems for further details on the patient's review from today.   Objective:   Physical Exam:  LMP 02/22/2019 (Approximate)   Physical Exam Constitutional:      General: She is not in acute distress. Musculoskeletal:        General: No deformity.  Neurological:     Mental Status: She is alert and oriented to person, place, and time.     Coordination: Coordination normal.  Psychiatric:        Attention and Perception: Attention and perception normal. She does not perceive auditory or visual hallucinations.        Mood and Affect: Mood is anxious and depressed.  Affect is not labile, blunt or inappropriate.        Speech: Speech normal. Speech is not rapid and pressured.        Behavior: Behavior normal.        Thought Content: Thought content normal. Thought content is not paranoid or delusional. Thought content does not include homicidal or suicidal ideation.        Cognition and Memory: Cognition and memory normal.        Judgment: Judgment normal.     Comments: Insight intact more anhedonia and depression     Lab Review:     Component Value Date/Time   NA 143 05/17/2023 1118   NA 138 03/15/2023 0000   K 4.6 05/17/2023 1118   CL 104 05/17/2023 1118   CO2 28 05/17/2023 1118   GLUCOSE 114 (H) 05/17/2023 1118   BUN 9 05/17/2023 1118   BUN 16 03/15/2023 0000   CREATININE 1.33 (H) 05/17/2023 1118   CALCIUM 9.7 05/17/2023 1118   PROT 6.2 (L) 05/17/2023 1118   ALBUMIN 3.7 05/17/2023 1118   AST 43 (H) 05/17/2023 1118   ALT 45 (H) 05/17/2023 1118   ALKPHOS 205 (H) 05/17/2023 1118   BILITOT 0.5 05/17/2023 1118   GFRNONAA 47 (L) 05/17/2023 1118   GFRNONAA 52 10/23/2022 0000       Component Value Date/Time   WBC 4.2 05/17/2023 1118   WBC 4.8 06/07/2020 0525   RBC 3.72 (L) 05/17/2023 1118   HGB 12.4 05/17/2023 1118   HCT 38.8 05/17/2023 1118   PLT 55 (L) 05/17/2023 1118   MCV 104.3 (H) 05/17/2023 1118   MCV 101 (A) 01/04/2023 0000   MCH 33.3 05/17/2023 1118   MCHC 32.0 05/17/2023 1118  RDW 16.8 (H) 05/17/2023 1118   LYMPHSABS 1.2 05/17/2023 1118   MONOABS 0.5 05/17/2023 1118   EOSABS 0.1 05/17/2023 1118   BASOSABS 0.0 05/17/2023 1118    No results found for: "POCLITH", "LITHIUM"   No results found for: "PHENYTOIN", "PHENOBARB", "VALPROATE", "CBMZ"   .res Assessment: Plan:    Severe recurrent major depression with psychotic features (HCC) - Plan: Dextromethorphan-buPROPion ER (AUVELITY) 45-105 MG TBCR, cariprazine (VRAYLAR) 3 MG capsule  Mixed obsessional thoughts and acts - Plan: cariprazine (VRAYLAR) 3 MG  capsule  Delayed sleep phase syndrome  Mild cognitive impairment  Neoplastic malignant related fatigue   Overall mood and anxiety are well managed.  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  Partially better with the increase in Wellbutrin to 450 mg daily but still dep significantly interfereing with quality of life..  Discussed potential metabolic side effects associated with atypical antipsychotics, as well as potential risk for movement side effects. Advised pt to contact office if movement side effects occur.   Continue Vraylar 3 mg every other day  Wants to have access to Ambien but doesn't keep her asleep.  Continue Luvox to 2 and 1/2 tablets  DC Wellbutrin and trial Auvelity 1 Am and then BID for residual depression anhedonia  OK continue Modafinil to 300 mg daily for excessive sleepiness and cancer related fatigue Disc EFA, trial trazodone 50-100 mg HS Option Lunesta alternative  Disc polypharmacy and will try to eliminate some meds if depression improves.  Option reduce fluvox at follow up.   FU 8 weeks  Meredith Staggers, MD, DFAPA   Please see After Visit Summary for patient specific instructions.  Future Appointments  Date Time Provider Department Center  05/24/2023  2:30 PM [facility] INJECTION [facility] None  05/27/2023  2:30 PM [facility] INJE[facility] None  05/28/2023  2:30 PM [facility] INJECTION [facility] None  06/07/2023 10:15 AM [facility] PORT FLUSH LAB [facility] None  06/07/2023 10:45 AM Lewis, Dequincy A, MD [facility] None  06/07/2023 11:00 AM [facility] INFUSION CHAIR 1 [facility] None  10/04/2023  7:00 AM [facility] None    No orders of the defined types were placed in this encounter.      -------------------------------

## 2023-05-24 ENCOUNTER — Inpatient Hospital Stay: Payer: 59

## 2023-05-24 VITALS — BP 109/75 | HR 93 | Temp 97.7°F | Resp 18 | Ht 68.0 in | Wt 154.2 lb

## 2023-05-24 DIAGNOSIS — C7800 Secondary malignant neoplasm of unspecified lung: Secondary | ICD-10-CM

## 2023-05-24 DIAGNOSIS — C787 Secondary malignant neoplasm of liver and intrahepatic bile duct: Secondary | ICD-10-CM | POA: Diagnosis not present

## 2023-05-24 DIAGNOSIS — C7971 Secondary malignant neoplasm of right adrenal gland: Secondary | ICD-10-CM | POA: Diagnosis not present

## 2023-05-24 DIAGNOSIS — Z5189 Encounter for other specified aftercare: Secondary | ICD-10-CM | POA: Diagnosis not present

## 2023-05-24 DIAGNOSIS — C189 Malignant neoplasm of colon, unspecified: Secondary | ICD-10-CM | POA: Diagnosis not present

## 2023-05-24 DIAGNOSIS — C182 Malignant neoplasm of ascending colon: Secondary | ICD-10-CM

## 2023-05-24 DIAGNOSIS — C7801 Secondary malignant neoplasm of right lung: Secondary | ICD-10-CM | POA: Diagnosis not present

## 2023-05-24 DIAGNOSIS — C7931 Secondary malignant neoplasm of brain: Secondary | ICD-10-CM | POA: Diagnosis not present

## 2023-05-24 DIAGNOSIS — C786 Secondary malignant neoplasm of retroperitoneum and peritoneum: Secondary | ICD-10-CM | POA: Diagnosis not present

## 2023-05-24 DIAGNOSIS — Z5111 Encounter for antineoplastic chemotherapy: Secondary | ICD-10-CM | POA: Diagnosis not present

## 2023-05-24 DIAGNOSIS — C7802 Secondary malignant neoplasm of left lung: Secondary | ICD-10-CM | POA: Diagnosis not present

## 2023-05-24 MED ORDER — FILGRASTIM-SNDZ 480 MCG/0.8ML IJ SOSY
480.0000 ug | PREFILLED_SYRINGE | Freq: Once | INTRAMUSCULAR | Status: AC
  Administered 2023-05-24: 480 ug via SUBCUTANEOUS
  Filled 2023-05-24: qty 0.8

## 2023-05-27 ENCOUNTER — Inpatient Hospital Stay: Payer: 59

## 2023-05-27 VITALS — BP 111/82 | HR 80 | Temp 98.1°F | Resp 18

## 2023-05-27 DIAGNOSIS — C786 Secondary malignant neoplasm of retroperitoneum and peritoneum: Secondary | ICD-10-CM | POA: Diagnosis not present

## 2023-05-27 DIAGNOSIS — C787 Secondary malignant neoplasm of liver and intrahepatic bile duct: Secondary | ICD-10-CM | POA: Diagnosis not present

## 2023-05-27 DIAGNOSIS — C182 Malignant neoplasm of ascending colon: Secondary | ICD-10-CM

## 2023-05-27 DIAGNOSIS — Z5111 Encounter for antineoplastic chemotherapy: Secondary | ICD-10-CM | POA: Diagnosis not present

## 2023-05-27 DIAGNOSIS — C7931 Secondary malignant neoplasm of brain: Secondary | ICD-10-CM | POA: Diagnosis not present

## 2023-05-27 DIAGNOSIS — C7801 Secondary malignant neoplasm of right lung: Secondary | ICD-10-CM | POA: Diagnosis not present

## 2023-05-27 DIAGNOSIS — C189 Malignant neoplasm of colon, unspecified: Secondary | ICD-10-CM

## 2023-05-27 DIAGNOSIS — C7971 Secondary malignant neoplasm of right adrenal gland: Secondary | ICD-10-CM | POA: Diagnosis not present

## 2023-05-27 DIAGNOSIS — Z5189 Encounter for other specified aftercare: Secondary | ICD-10-CM | POA: Diagnosis not present

## 2023-05-27 DIAGNOSIS — C7802 Secondary malignant neoplasm of left lung: Secondary | ICD-10-CM | POA: Diagnosis not present

## 2023-05-27 MED ORDER — FILGRASTIM-SNDZ 480 MCG/0.8ML IJ SOSY
480.0000 ug | PREFILLED_SYRINGE | Freq: Once | INTRAMUSCULAR | Status: AC
  Administered 2023-05-27: 480 ug via SUBCUTANEOUS
  Filled 2023-05-27: qty 0.8

## 2023-05-27 NOTE — Patient Instructions (Signed)
Filgrastim Injection What is this medication? FILGRASTIM (fil GRA stim) lowers the risk of infection in people who are receiving chemotherapy. It works by helping your body make more white blood cells, which protects your body from infection. It may also be used to help people who have been exposed to high doses of radiation. It can be used to help prepare your body before a stem cell transplant. It works by helping your bone marrow make and release stem cells into the blood. This medicine may be used for other purposes; ask your health care provider or pharmacist if you have questions. COMMON BRAND NAME(S): Neupogen, Nivestym, Releuko, Zarxio What should I tell my care team before I take this medication? They need to know if you have any of these conditions: History of blood diseases, such as sickle cell anemia Kidney disease Recent or ongoing radiation An unusual or allergic reaction to filgrastim, pegfilgrastim, latex, rubber, other medications, foods, dyes, or preservatives Pregnant or trying to get pregnant Breast-feeding How should I use this medication? This medication is injected under the skin or into a vein. It is usually given by your care team in a hospital or clinic setting. It may be given at home. If you get this medication at home, you will be taught how to prepare and give it. Use exactly as directed. Take it as directed on the prescription label at the same time every day. Keep taking it unless your care team tells you to stop. It is important that you put your used needles and syringes in a special sharps container. Do not put them in a trash can. If you do not have a sharps container, call your pharmacist or care team to get one. This medication comes with INSTRUCTIONS FOR USE. Ask your pharmacist for directions on how to use this medication. Read the information carefully. Talk to your pharmacist or care team if you have questions. Talk to your care team about the use of this  medication in children. While it may be prescribed for children for selected conditions, precautions do apply. Overdosage: If you think you have taken too much of this medicine contact a poison control center or emergency room at once. NOTE: This medicine is only for you. Do not share this medicine with others. What if I miss a dose? It is important not to miss any doses. Talk to your care team about what to do if you miss a dose. What may interact with this medication? Medications that may cause a release of neutrophils, such as lithium This list may not describe all possible interactions. Give your health care provider a list of all the medicines, herbs, non-prescription drugs, or dietary supplements you use. Also tell them if you smoke, drink alcohol, or use illegal drugs. Some items may interact with your medicine. What should I watch for while using this medication? Your condition will be monitored carefully while you are receiving this medication. You may need bloodwork while taking this medication. Talk to your care team about your risk of cancer. You may be more at risk for certain types of cancer if you take this medication. What side effects may I notice from receiving this medication? Side effects that you should report to your care team as soon as possible: Allergic reactions--skin rash, itching, hives, swelling of the face, lips, tongue, or throat Capillary leak syndrome--stomach or muscle pain, unusual weakness or fatigue, feeling faint or lightheaded, decrease in the amount of urine, swelling of the ankles, hands, or   feet, trouble breathing High white blood cell level--fever, fatigue, trouble breathing, night sweats, change in vision, weight loss Inflammation of the aorta--fever, fatigue, back, chest, or stomach pain, severe headache Kidney injury (glomerulonephritis)--decrease in the amount of urine, red or dark brown urine, foamy or bubbly urine, swelling of the ankles, hands, or  feet Shortness of breath or trouble breathing Spleen injury--pain in upper left stomach or shoulder Unusual bruising or bleeding Side effects that usually do not require medical attention (report to your care team if they continue or are bothersome): Back pain Bone pain Fatigue Fever Headache Nausea This list may not describe all possible side effects. Call your doctor for medical advice about side effects. You may report side effects to FDA at 1-800-FDA-1088. Where should I keep my medication? Keep out of the reach of children and pets. Keep this medication in the original packaging until you are ready to take it. Protect from light. See product for storage information. Each product may have different instructions. Get rid of any unused medication after the expiration date. To get rid of medications that are no longer needed or have expired: Take the medication to a medications take-back program. Check with your pharmacy or law enforcement to find a location. If you cannot return the medication, ask your pharmacist or care team how to get rid of this medication safely. NOTE: This sheet is a summary. It may not cover all possible information. If you have questions about this medicine, talk to your doctor, pharmacist, or health care provider.  2024 Elsevier/Gold Standard (2021-10-09 00:00:00)  

## 2023-05-28 ENCOUNTER — Inpatient Hospital Stay: Payer: 59

## 2023-05-28 VITALS — BP 130/77 | HR 98 | Temp 97.9°F | Resp 18

## 2023-05-28 DIAGNOSIS — C7802 Secondary malignant neoplasm of left lung: Secondary | ICD-10-CM | POA: Diagnosis not present

## 2023-05-28 DIAGNOSIS — C786 Secondary malignant neoplasm of retroperitoneum and peritoneum: Secondary | ICD-10-CM | POA: Diagnosis not present

## 2023-05-28 DIAGNOSIS — C182 Malignant neoplasm of ascending colon: Secondary | ICD-10-CM

## 2023-05-28 DIAGNOSIS — C7801 Secondary malignant neoplasm of right lung: Secondary | ICD-10-CM | POA: Diagnosis not present

## 2023-05-28 DIAGNOSIS — Z5111 Encounter for antineoplastic chemotherapy: Secondary | ICD-10-CM | POA: Diagnosis not present

## 2023-05-28 DIAGNOSIS — C7931 Secondary malignant neoplasm of brain: Secondary | ICD-10-CM | POA: Diagnosis not present

## 2023-05-28 DIAGNOSIS — C189 Malignant neoplasm of colon, unspecified: Secondary | ICD-10-CM

## 2023-05-28 DIAGNOSIS — C787 Secondary malignant neoplasm of liver and intrahepatic bile duct: Secondary | ICD-10-CM | POA: Diagnosis not present

## 2023-05-28 DIAGNOSIS — C7971 Secondary malignant neoplasm of right adrenal gland: Secondary | ICD-10-CM | POA: Diagnosis not present

## 2023-05-28 DIAGNOSIS — Z5189 Encounter for other specified aftercare: Secondary | ICD-10-CM | POA: Diagnosis not present

## 2023-05-28 MED ORDER — FILGRASTIM-SNDZ 480 MCG/0.8ML IJ SOSY
480.0000 ug | PREFILLED_SYRINGE | Freq: Once | INTRAMUSCULAR | Status: AC
  Administered 2023-05-28: 480 ug via SUBCUTANEOUS
  Filled 2023-05-28: qty 0.8

## 2023-05-28 NOTE — Patient Instructions (Signed)
Filgrastim Injection What is this medication? FILGRASTIM (fil GRA stim) lowers the risk of infection in people who are receiving chemotherapy. It works by helping your body make more white blood cells, which protects your body from infection. It may also be used to help people who have been exposed to high doses of radiation. It can be used to help prepare your body before a stem cell transplant. It works by helping your bone marrow make and release stem cells into the blood. This medicine may be used for other purposes; ask your health care provider or pharmacist if you have questions. COMMON BRAND NAME(S): Neupogen, Nivestym, Releuko, Zarxio What should I tell my care team before I take this medication? They need to know if you have any of these conditions: History of blood diseases, such as sickle cell anemia Kidney disease Recent or ongoing radiation An unusual or allergic reaction to filgrastim, pegfilgrastim, latex, rubber, other medications, foods, dyes, or preservatives Pregnant or trying to get pregnant Breast-feeding How should I use this medication? This medication is injected under the skin or into a vein. It is usually given by your care team in a hospital or clinic setting. It may be given at home. If you get this medication at home, you will be taught how to prepare and give it. Use exactly as directed. Take it as directed on the prescription label at the same time every day. Keep taking it unless your care team tells you to stop. It is important that you put your used needles and syringes in a special sharps container. Do not put them in a trash can. If you do not have a sharps container, call your pharmacist or care team to get one. This medication comes with INSTRUCTIONS FOR USE. Ask your pharmacist for directions on how to use this medication. Read the information carefully. Talk to your pharmacist or care team if you have questions. Talk to your care team about the use of this  medication in children. While it may be prescribed for children for selected conditions, precautions do apply. Overdosage: If you think you have taken too much of this medicine contact a poison control center or emergency room at once. NOTE: This medicine is only for you. Do not share this medicine with others. What if I miss a dose? It is important not to miss any doses. Talk to your care team about what to do if you miss a dose. What may interact with this medication? Medications that may cause a release of neutrophils, such as lithium This list may not describe all possible interactions. Give your health care provider a list of all the medicines, herbs, non-prescription drugs, or dietary supplements you use. Also tell them if you smoke, drink alcohol, or use illegal drugs. Some items may interact with your medicine. What should I watch for while using this medication? Your condition will be monitored carefully while you are receiving this medication. You may need bloodwork while taking this medication. Talk to your care team about your risk of cancer. You may be more at risk for certain types of cancer if you take this medication. What side effects may I notice from receiving this medication? Side effects that you should report to your care team as soon as possible: Allergic reactions--skin rash, itching, hives, swelling of the face, lips, tongue, or throat Capillary leak syndrome--stomach or muscle pain, unusual weakness or fatigue, feeling faint or lightheaded, decrease in the amount of urine, swelling of the ankles, hands, or   feet, trouble breathing High white blood cell level--fever, fatigue, trouble breathing, night sweats, change in vision, weight loss Inflammation of the aorta--fever, fatigue, back, chest, or stomach pain, severe headache Kidney injury (glomerulonephritis)--decrease in the amount of urine, red or dark brown urine, foamy or bubbly urine, swelling of the ankles, hands, or  feet Shortness of breath or trouble breathing Spleen injury--pain in upper left stomach or shoulder Unusual bruising or bleeding Side effects that usually do not require medical attention (report to your care team if they continue or are bothersome): Back pain Bone pain Fatigue Fever Headache Nausea This list may not describe all possible side effects. Call your doctor for medical advice about side effects. You may report side effects to FDA at 1-800-FDA-1088. Where should I keep my medication? Keep out of the reach of children and pets. Keep this medication in the original packaging until you are ready to take it. Protect from light. See product for storage information. Each product may have different instructions. Get rid of any unused medication after the expiration date. To get rid of medications that are no longer needed or have expired: Take the medication to a medications take-back program. Check with your pharmacy or law enforcement to find a location. If you cannot return the medication, ask your pharmacist or care team how to get rid of this medication safely. NOTE: This sheet is a summary. It may not cover all possible information. If you have questions about this medicine, talk to your doctor, pharmacist, or health care provider.  2024 Elsevier/Gold Standard (2021-10-09 00:00:00)  

## 2023-06-01 ENCOUNTER — Encounter: Payer: Self-pay | Admitting: Oncology

## 2023-06-04 ENCOUNTER — Other Ambulatory Visit: Payer: Self-pay | Admitting: Psychiatry

## 2023-06-04 DIAGNOSIS — G4721 Circadian rhythm sleep disorder, delayed sleep phase type: Secondary | ICD-10-CM

## 2023-06-06 NOTE — Progress Notes (Signed)
 [facility]  [address] [place],  [place]  [address] [phone]  Clinic Day:  05/17/2023  Referring physician: Gayl Males, MD  HISTORY OF PRESENT ILLNESS:  The patient is a 58 y.o. female with  metastatic colon cancer, which includes spread of disease to her abdominal cavity, lungs, brain and right adrenal gland.  She comes in today to be evaluated before heading into her 19th cycle of FOLFOX/Avastin .  Of note, her cycles of chemotherapy were recently spaced out to every 3 weeks.  Since her last visit, the patient has been doing okay.  She has chronic GI issues, including intermittent diarrhea, as well as occasional vomiting.  However, she denies having any new GI symptoms which concern her for overt signs of disease progression.   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  With respect to her colon cancer history, she is status post a right hemicolectomy in early October 2018, followed by 12 cycles of adjuvant FOLFOX chemotherapy, which were completed in April 2019.  In December 2021, she underwent a left cerebellar metastasectomy, whose pathology was consistent with metastatic colon cancer.  Tumor testing did come back MMR normal. CT scans revealed evidence of her cancer being in multiple locations, for which she took 40 cycles of FOLFIRI/Avastin .  As follow-up scans revealed liver metastasis, she was placed back on FOLFOX/Avastin , for which she continues to take.  PHYSICAL EXAM:  Blood pressure 100/75, pulse 97, temperature 98 F (36.7 C), temperature source Oral, resp. rate 18, height 5' 8 (1.727 m), weight 151 lb 4.8 oz (68.6 kg), last menstrual period 02/22/2019, SpO2 100%. Body mass index is 23.01 kg/m.  Performance status (ECOG): 1 - Symptomatic but completely ambulatory  Physical Exam Vitals and nursing note reviewed.  Constitutional:      General: She is not in  acute distress.    Appearance: Normal appearance.  HENT:     Head: Normocephalic and atraumatic.     Mouth/Throat:     Mouth: Mucous membranes are moist.     Pharynx: Oropharynx is clear. No oropharyngeal exudate or posterior oropharyngeal erythema.  Eyes:     General: No scleral icterus.    Extraocular Movements: Extraocular movements intact.     Conjunctiva/sclera: Conjunctivae normal.     Pupils: Pupils are equal, round, and reactive to light.  Cardiovascular:     Rate and Rhythm: Normal rate and regular rhythm.     Heart sounds: Normal heart sounds. No murmur heard.    No friction rub. No gallop.  Pulmonary:     Effort: Pulmonary effort is normal.     Breath sounds: Normal breath sounds. No wheezing, rhonchi or rales.  Abdominal:     General: There is no distension.     Palpations: Abdomen is soft. There is no hepatomegaly, splenomegaly or mass.     Tenderness: There is no abdominal tenderness.  Musculoskeletal:        General: Normal range of motion.     Cervical back: Normal range of motion and neck supple. No tenderness.     Right lower leg: No edema.     Left lower leg: No edema.  Lymphadenopathy:     Cervical: No cervical adenopathy.     Upper Body:     Right upper body: No supraclavicular or axillary adenopathy.     Left upper body: No supraclavicular or axillary adenopathy.     Lower  Body: No right inguinal adenopathy. No left inguinal adenopathy.  Skin:    General: Skin is warm and dry.     Coloration: Skin is not jaundiced.     Findings: No rash.  Neurological:     Mental Status: She is alert and oriented to person, place, and time.     Cranial Nerves: No cranial nerve deficit.  Psychiatric:        Mood and Affect: Mood normal.        Behavior: Behavior normal.        Thought Content: Thought content normal.    LABS:      Latest Ref Rng & Units 06/07/2023   10:35 AM 05/17/2023   11:18 AM 04/26/2023   11:44 AM  CBC  WBC 4.0 - 10.5 K/uL 5.4  4.2  3.2    Hemoglobin 12.0 - 15.0 g/dL 87.4  87.5  88.1   Hematocrit 36.0 - 46.0 % 38.2  38.8  36.1   Platelets 150 - 400 K/uL 51  55  53       Latest Ref Rng & Units 06/07/2023   10:35 AM 05/17/2023   11:18 AM 04/26/2023   11:44 AM  CMP  Glucose 70 - 99 mg/dL 833  885  840   BUN 6 - 20 mg/dL 13  9  12    Creatinine 0.44 - 1.00 mg/dL 8.49  8.66  8.55   Sodium 135 - 145 mmol/L 140  143  141   Potassium 3.5 - 5.1 mmol/L 4.5  4.6  4.0   Chloride 98 - 111 mmol/L 102  104  105   CO2 22 - 32 mmol/L 26  28  24    Calcium  8.9 - 10.3 mg/dL 9.7  9.7  9.6   Total Protein 6.5 - 8.1 g/dL 6.3  6.2  6.1   Total Bilirubin 0.0 - 1.2 mg/dL 0.5  0.5  0.3   Alkaline Phos 38 - 126 U/L 180  205  180   AST 15 - 41 U/L 28  43  30   ALT 0 - 44 U/L 20  45  34    ASSESSMENT & PLAN:  Assessment/Plan:  A 58 y.o. female with metastatic colon cancer.  She will proceed with her 19th cycle of FOLFOX/Avastin  today.  As mentioned previously, all of her chemotherapy has been spaced out to every 3 weeks.  Clinically, she seems to be doing fairly well.  I will see her back in 3 weeks before she heads into her 20th cycle of FOLFOX/Avastin . The patient understands all the plans discussed today and is in agreement with them.  Juanetta Negash DELENA Kerns, MD

## 2023-06-07 ENCOUNTER — Encounter: Payer: Self-pay | Admitting: Oncology

## 2023-06-07 ENCOUNTER — Inpatient Hospital Stay: Payer: 59 | Attending: Oncology | Admitting: Oncology

## 2023-06-07 ENCOUNTER — Inpatient Hospital Stay: Payer: 59

## 2023-06-07 ENCOUNTER — Telehealth: Payer: Self-pay | Admitting: Oncology

## 2023-06-07 VITALS — BP 100/75 | HR 97 | Temp 98.0°F | Resp 18 | Ht 68.0 in | Wt 151.3 lb

## 2023-06-07 DIAGNOSIS — D696 Thrombocytopenia, unspecified: Secondary | ICD-10-CM | POA: Diagnosis not present

## 2023-06-07 DIAGNOSIS — C7802 Secondary malignant neoplasm of left lung: Secondary | ICD-10-CM | POA: Diagnosis not present

## 2023-06-07 DIAGNOSIS — C182 Malignant neoplasm of ascending colon: Secondary | ICD-10-CM

## 2023-06-07 DIAGNOSIS — Z5111 Encounter for antineoplastic chemotherapy: Secondary | ICD-10-CM | POA: Diagnosis not present

## 2023-06-07 DIAGNOSIS — G629 Polyneuropathy, unspecified: Secondary | ICD-10-CM | POA: Diagnosis not present

## 2023-06-07 DIAGNOSIS — Z5189 Encounter for other specified aftercare: Secondary | ICD-10-CM | POA: Diagnosis not present

## 2023-06-07 DIAGNOSIS — C7931 Secondary malignant neoplasm of brain: Secondary | ICD-10-CM | POA: Diagnosis not present

## 2023-06-07 DIAGNOSIS — C189 Malignant neoplasm of colon, unspecified: Secondary | ICD-10-CM

## 2023-06-07 DIAGNOSIS — C787 Secondary malignant neoplasm of liver and intrahepatic bile duct: Secondary | ICD-10-CM | POA: Diagnosis not present

## 2023-06-07 DIAGNOSIS — C786 Secondary malignant neoplasm of retroperitoneum and peritoneum: Secondary | ICD-10-CM | POA: Insufficient documentation

## 2023-06-07 DIAGNOSIS — C7801 Secondary malignant neoplasm of right lung: Secondary | ICD-10-CM | POA: Diagnosis not present

## 2023-06-07 DIAGNOSIS — R110 Nausea: Secondary | ICD-10-CM | POA: Insufficient documentation

## 2023-06-07 DIAGNOSIS — C7971 Secondary malignant neoplasm of right adrenal gland: Secondary | ICD-10-CM | POA: Diagnosis not present

## 2023-06-07 LAB — CMP (CANCER CENTER ONLY)
ALT: 20 U/L (ref 0–44)
AST: 28 U/L (ref 15–41)
Albumin: 4 g/dL (ref 3.5–5.0)
Alkaline Phosphatase: 180 U/L — ABNORMAL HIGH (ref 38–126)
Anion gap: 12 (ref 5–15)
BUN: 13 mg/dL (ref 6–20)
CO2: 26 mmol/L (ref 22–32)
Calcium: 9.7 mg/dL (ref 8.9–10.3)
Chloride: 102 mmol/L (ref 98–111)
Creatinine: 1.5 mg/dL — ABNORMAL HIGH (ref 0.44–1.00)
GFR, Estimated: 41 mL/min — ABNORMAL LOW (ref >60)
Glucose, Bld: 166 mg/dL — ABNORMAL HIGH (ref 70–99)
Potassium: 4.5 mmol/L (ref 3.5–5.1)
Sodium: 140 mmol/L (ref 135–145)
Total Bilirubin: 0.5 mg/dL (ref 0.0–1.2)
Total Protein: 6.3 g/dL — ABNORMAL LOW (ref 6.5–8.1)

## 2023-06-07 LAB — CBC WITH DIFFERENTIAL (CANCER CENTER ONLY)
Abs Immature Granulocytes: 0.03 10*3/uL (ref 0.00–0.07)
Basophils Absolute: 0 10*3/uL (ref 0.0–0.1)
Basophils Relative: 0 %
Eosinophils Absolute: 0.1 10*3/uL (ref 0.0–0.5)
Eosinophils Relative: 1 %
HCT: 38.2 % (ref 36.0–46.0)
Hemoglobin: 12.5 g/dL (ref 12.0–15.0)
Immature Granulocytes: 1 %
Immature Platelet Fraction: 2.7 % (ref 1.2–8.6)
Lymphocytes Relative: 17 %
Lymphs Abs: 0.9 10*3/uL (ref 0.7–4.0)
MCH: 34.5 pg — ABNORMAL HIGH (ref 26.0–34.0)
MCHC: 32.7 g/dL (ref 30.0–36.0)
MCV: 105.5 fL — ABNORMAL HIGH (ref 80.0–100.0)
Monocytes Absolute: 0.4 10*3/uL (ref 0.1–1.0)
Monocytes Relative: 6 %
Neutro Abs: 4.1 10*3/uL (ref 1.7–7.7)
Neutrophils Relative %: 75 %
Platelet Count: 51 10*3/uL — ABNORMAL LOW (ref 150–400)
RBC: 3.62 MIL/uL — ABNORMAL LOW (ref 3.87–5.11)
RDW: 15.1 % (ref 11.5–15.5)
WBC Count: 5.4 10*3/uL (ref 4.0–10.5)
nRBC: 0 % (ref 0.0–0.2)
nRBC: 0 /100{WBCs}

## 2023-06-07 LAB — TOTAL PROTEIN, URINE DIPSTICK: Protein, ur: NEGATIVE mg/dL

## 2023-06-07 MED ORDER — PALONOSETRON HCL INJECTION 0.25 MG/5ML
0.2500 mg | Freq: Once | INTRAVENOUS | Status: AC
  Administered 2023-06-07: 0.25 mg via INTRAVENOUS
  Filled 2023-06-07: qty 5

## 2023-06-07 MED ORDER — OXALIPLATIN CHEMO INJECTION 50 MG/10ML
63.7500 mg/m2 | Freq: Once | INTRAVENOUS | Status: AC
  Administered 2023-06-07: 115 mg via INTRAVENOUS
  Filled 2023-06-07: qty 20

## 2023-06-07 MED ORDER — FAMOTIDINE IN NACL 20-0.9 MG/50ML-% IV SOLN
20.0000 mg | Freq: Once | INTRAVENOUS | Status: AC
  Administered 2023-06-07: 20 mg via INTRAVENOUS
  Filled 2023-06-07: qty 50

## 2023-06-07 MED ORDER — DEXAMETHASONE SODIUM PHOSPHATE 10 MG/ML IJ SOLN
10.0000 mg | Freq: Once | INTRAMUSCULAR | Status: AC
  Administered 2023-06-07: 10 mg via INTRAVENOUS
  Filled 2023-06-07: qty 1

## 2023-06-07 MED ORDER — HEPARIN SOD (PORK) LOCK FLUSH 100 UNIT/ML IV SOLN: 500.0000 [IU] | Freq: Once | INTRAVENOUS | Status: DC | PRN

## 2023-06-07 MED ORDER — DEXTROSE 5 % IV SOLN: Freq: Once | INTRAVENOUS | Status: AC

## 2023-06-07 MED ORDER — SODIUM CHLORIDE 0.9% FLUSH: 10.0000 mL | INTRAVENOUS | Status: DC | PRN

## 2023-06-07 MED ORDER — SODIUM CHLORIDE 0.9 % IV SOLN
2400.0000 mg/m2 | INTRAVENOUS | Status: DC
  Administered 2023-06-07: 4350 mg via INTRAVENOUS
  Filled 2023-06-07: qty 87

## 2023-06-07 MED ORDER — SODIUM CHLORIDE 0.9 % IV SOLN: Freq: Once | INTRAVENOUS | Status: AC

## 2023-06-07 MED ORDER — SODIUM CHLORIDE 0.9 % IV SOLN
5.0000 mg/kg | Freq: Once | INTRAVENOUS | Status: AC
  Administered 2023-06-07: 350 mg via INTRAVENOUS
  Filled 2023-06-07: qty 14

## 2023-06-07 MED ORDER — DEXTROSE 5 % IV SOLN: Freq: Once | INTRAVENOUS | Status: DC

## 2023-06-07 MED ORDER — DIPHENHYDRAMINE HCL 50 MG/ML IJ SOLN
25.0000 mg | Freq: Once | INTRAMUSCULAR | Status: AC
  Administered 2023-06-07: 25 mg via INTRAVENOUS
  Filled 2023-06-07: qty 1

## 2023-06-07 MED ORDER — FLUOROURACIL CHEMO INJECTION 2.5 GM/50ML
400.0000 mg/m2 | Freq: Once | INTRAVENOUS | Status: AC
  Administered 2023-06-07: 700 mg via INTRAVENOUS
  Filled 2023-06-07: qty 14

## 2023-06-07 MED ORDER — SODIUM CHLORIDE 0.9 % IV SOLN
150.0000 mg | Freq: Once | INTRAVENOUS | Status: AC
  Administered 2023-06-07: 150 mg via INTRAVENOUS
  Filled 2023-06-07: qty 150

## 2023-06-07 NOTE — Telephone Encounter (Signed)
 06/07/23 Next appts scheduled and confirmed with patient

## 2023-06-07 NOTE — Patient Instructions (Signed)
The chemotherapy medication bag should finish at 46 hours, 96 hours, or 7 days. For example, if your pump is scheduled for 46 hours and it was put on at 4:00 p.m., it should finish at 2:00 p.m. the day it is scheduled to come off regardless of your appointment time.     Estimated time to finish at 2pm .   If the display on your pump reads "Low Volume" and it is beeping, take the batteries out of the pump and come to the cancer center for it to be taken off.   If the pump alarms go off prior to the pump reading "Low Volume" then call [phone] and someone can assist you.  If the plunger comes out and the chemotherapy medication is leaking out, please use your home chemo spill kit to clean up the spill. Do NOT use paper towels or other household products.  If you have problems or questions regarding your pump, please call either [phone] (24 hours a day) or the cancer center Monday-Friday 8:00 a.m.- 4:30 p.m. at the clinic number and we will assist you. If you are unable to get assistance, then go to the nearest Emergency Department and ask the staff to contact the IV team for assistance.

## 2023-06-07 NOTE — Progress Notes (Signed)
 Ok to treat with platelets 51 per Dr Melvyn Neth

## 2023-06-08 ENCOUNTER — Other Ambulatory Visit: Payer: Self-pay | Admitting: Psychiatry

## 2023-06-08 DIAGNOSIS — F333 Major depressive disorder, recurrent, severe with psychotic symptoms: Secondary | ICD-10-CM

## 2023-06-09 ENCOUNTER — Inpatient Hospital Stay: Payer: 59

## 2023-06-09 VITALS — BP 101/66 | HR 99 | Temp 97.8°F | Resp 18 | Ht 68.0 in | Wt 153.2 lb

## 2023-06-09 DIAGNOSIS — D696 Thrombocytopenia, unspecified: Secondary | ICD-10-CM | POA: Diagnosis not present

## 2023-06-09 DIAGNOSIS — C7931 Secondary malignant neoplasm of brain: Secondary | ICD-10-CM | POA: Diagnosis not present

## 2023-06-09 DIAGNOSIS — Z5111 Encounter for antineoplastic chemotherapy: Secondary | ICD-10-CM | POA: Diagnosis not present

## 2023-06-09 DIAGNOSIS — C7800 Secondary malignant neoplasm of unspecified lung: Secondary | ICD-10-CM

## 2023-06-09 DIAGNOSIS — C786 Secondary malignant neoplasm of retroperitoneum and peritoneum: Secondary | ICD-10-CM | POA: Diagnosis not present

## 2023-06-09 DIAGNOSIS — R110 Nausea: Secondary | ICD-10-CM | POA: Diagnosis not present

## 2023-06-09 DIAGNOSIS — Z5189 Encounter for other specified aftercare: Secondary | ICD-10-CM | POA: Diagnosis not present

## 2023-06-09 DIAGNOSIS — G629 Polyneuropathy, unspecified: Secondary | ICD-10-CM | POA: Diagnosis not present

## 2023-06-09 DIAGNOSIS — C787 Secondary malignant neoplasm of liver and intrahepatic bile duct: Secondary | ICD-10-CM | POA: Diagnosis not present

## 2023-06-09 DIAGNOSIS — C7802 Secondary malignant neoplasm of left lung: Secondary | ICD-10-CM | POA: Diagnosis not present

## 2023-06-09 DIAGNOSIS — C7971 Secondary malignant neoplasm of right adrenal gland: Secondary | ICD-10-CM | POA: Diagnosis not present

## 2023-06-09 DIAGNOSIS — C189 Malignant neoplasm of colon, unspecified: Secondary | ICD-10-CM | POA: Diagnosis not present

## 2023-06-09 DIAGNOSIS — C7801 Secondary malignant neoplasm of right lung: Secondary | ICD-10-CM | POA: Diagnosis not present

## 2023-06-09 DIAGNOSIS — C182 Malignant neoplasm of ascending colon: Secondary | ICD-10-CM

## 2023-06-09 MED ORDER — HEPARIN SOD (PORK) LOCK FLUSH 100 UNIT/ML IV SOLN
500.0000 [IU] | Freq: Once | INTRAVENOUS | Status: AC | PRN
  Administered 2023-06-09: 500 [IU]

## 2023-06-09 MED ORDER — SODIUM CHLORIDE 0.9% FLUSH
10.0000 mL | INTRAVENOUS | Status: DC | PRN
  Administered 2023-06-09: 10 mL

## 2023-06-09 NOTE — Patient Instructions (Signed)
 The chemotherapy medication bag should finish at 46 hours, 96 hours, or 7 days. For example, if your pump is scheduled for 46 hours and it was put on at 4:00 p.m., it should finish at 2:00 p.m. the day it is scheduled to come off regardless of your appointment time.     Estimated time to finish at 1436.   If the display on your pump reads Low Volume and it is beeping, take the batteries out of the pump and come to the cancer center for it to be taken off.   If the pump alarms go off prior to the pump reading Low Volume then call [phone] and someone can assist you.  If the plunger comes out and the chemotherapy medication is leaking out, please use your home chemo spill kit to clean up the spill. Do NOT use paper towels or other household products.  If you have problems or questions regarding your pump, please call either [phone] (24 hours a day) or the cancer center Monday-Friday 8:00 a.m.- 4:30 p.m. at the clinic number and we will assist you. If you are unable to get assistance, then go to the nearest Emergency Department and ask the staff to contact the IV team for assistance.   [facility] - A DEPT OF [facility]  Discharge Instructions: Thank you for choosing [facility] to provide your oncology and hematology care.  If you have a lab appointment with the Cancer Center, please go directly to the Cancer Center and check in at the registration area.   Wear comfortable clothing and clothing appropriate for easy access to any Portacath or PICC line.   We strive to give you quality time with your provider. You may need to reschedule your appointment if you arrive late (15 or more minutes).  Arriving late affects you and other patients whose appointments are after yours.  Also, if you miss three or more appointments without notifying the office, you may be dismissed from the clinic at the provider's discretion.      For prescription  refill requests, have your pharmacy contact our office and allow 72 hours for refills to be completed.    Today you received the following chemotherapy and/or immunotherapy agents [id number]      To help prevent nausea and vomiting after your treatment, we encourage you to take your nausea medication as directed.  BELOW ARE SYMPTOMS THAT SHOULD BE REPORTED IMMEDIATELY: *FEVER GREATER THAN 100.4 F (38 C) OR HIGHER *CHILLS OR SWEATING *NAUSEA AND VOMITING THAT IS NOT CONTROLLED WITH YOUR NAUSEA MEDICATION *UNUSUAL SHORTNESS OF BREATH *UNUSUAL BRUISING OR BLEEDING *URINARY PROBLEMS (pain or burning when urinating, or frequent urination) *BOWEL PROBLEMS (unusual diarrhea, constipation, pain near the anus) TENDERNESS IN MOUTH AND THROAT WITH OR WITHOUT PRESENCE OF ULCERS (sore throat, sores in mouth, or a toothache) UNUSUAL RASH, SWELLING OR PAIN  UNUSUAL VAGINAL DISCHARGE OR ITCHING   Items with * indicate a potential emergency and should be followed up as soon as possible or go to the Emergency Department if any problems should occur.  Please show the CHEMOTHERAPY ALERT CARD or IMMUNOTHERAPY ALERT CARD at check-in to the Emergency Department and triage nurse.  Should you have questions after your visit or need to cancel or reschedule your appointment, please contact [facility] - A DEPT OF [facility]  Dept: [phone]  and follow the prompts.  Office hours are 8:00 a.m. to 4:30 p.m.  Monday - Friday. Please note that voicemails left after 4:00 p.m. may not be returned until the following business day.  We are closed weekends and major holidays. You have access to a nurse at all times for urgent questions. Please call the main number to the clinic Dept: [phone] and follow the prompts.  For any non-urgent questions, you may also contact your provider using MyChart. We now offer e-Visits for anyone 9 and older to request care online for non-urgent  symptoms. For details visit [email or web address].   Also download the MyChart app! Go to the app store, search MyChart, open the app, select [facility], and log in with your MyChart username and password.

## 2023-06-14 ENCOUNTER — Inpatient Hospital Stay: Payer: 59

## 2023-06-14 VITALS — BP 108/68 | HR 94 | Temp 98.4°F | Resp 20 | Wt 152.0 lb

## 2023-06-14 DIAGNOSIS — Z5189 Encounter for other specified aftercare: Secondary | ICD-10-CM | POA: Diagnosis not present

## 2023-06-14 DIAGNOSIS — C7801 Secondary malignant neoplasm of right lung: Secondary | ICD-10-CM | POA: Diagnosis not present

## 2023-06-14 DIAGNOSIS — G629 Polyneuropathy, unspecified: Secondary | ICD-10-CM | POA: Diagnosis not present

## 2023-06-14 DIAGNOSIS — C7802 Secondary malignant neoplasm of left lung: Secondary | ICD-10-CM | POA: Diagnosis not present

## 2023-06-14 DIAGNOSIS — C189 Malignant neoplasm of colon, unspecified: Secondary | ICD-10-CM | POA: Diagnosis not present

## 2023-06-14 DIAGNOSIS — D696 Thrombocytopenia, unspecified: Secondary | ICD-10-CM | POA: Diagnosis not present

## 2023-06-14 DIAGNOSIS — C7971 Secondary malignant neoplasm of right adrenal gland: Secondary | ICD-10-CM | POA: Diagnosis not present

## 2023-06-14 DIAGNOSIS — C786 Secondary malignant neoplasm of retroperitoneum and peritoneum: Secondary | ICD-10-CM | POA: Diagnosis not present

## 2023-06-14 DIAGNOSIS — C7931 Secondary malignant neoplasm of brain: Secondary | ICD-10-CM | POA: Diagnosis not present

## 2023-06-14 DIAGNOSIS — Z5111 Encounter for antineoplastic chemotherapy: Secondary | ICD-10-CM | POA: Diagnosis not present

## 2023-06-14 DIAGNOSIS — C182 Malignant neoplasm of ascending colon: Secondary | ICD-10-CM

## 2023-06-14 DIAGNOSIS — C787 Secondary malignant neoplasm of liver and intrahepatic bile duct: Secondary | ICD-10-CM | POA: Diagnosis not present

## 2023-06-14 DIAGNOSIS — R110 Nausea: Secondary | ICD-10-CM | POA: Diagnosis not present

## 2023-06-14 MED ORDER — FILGRASTIM-SNDZ 480 MCG/0.8ML IJ SOSY
480.0000 ug | PREFILLED_SYRINGE | Freq: Once | INTRAMUSCULAR | Status: AC
  Administered 2023-06-14: 480 ug via SUBCUTANEOUS
  Filled 2023-06-14: qty 0.8

## 2023-06-14 NOTE — Patient Instructions (Signed)
Filgrastim Injection What is this medication? FILGRASTIM (fil GRA stim) lowers the risk of infection in people who are receiving chemotherapy. It works by helping your body make more white blood cells, which protects your body from infection. It may also be used to help people who have been exposed to high doses of radiation. It can be used to help prepare your body before a stem cell transplant. It works by helping your bone marrow make and release stem cells into the blood. This medicine may be used for other purposes; ask your health care provider or pharmacist if you have questions. COMMON BRAND NAME(S): Neupogen, Nivestym, Releuko, Zarxio What should I tell my care team before I take this medication? They need to know if you have any of these conditions: History of blood diseases, such as sickle cell anemia Kidney disease Recent or ongoing radiation An unusual or allergic reaction to filgrastim, pegfilgrastim, latex, rubber, other medications, foods, dyes, or preservatives Pregnant or trying to get pregnant Breast-feeding How should I use this medication? This medication is injected under the skin or into a vein. It is usually given by your care team in a hospital or clinic setting. It may be given at home. If you get this medication at home, you will be taught how to prepare and give it. Use exactly as directed. Take it as directed on the prescription label at the same time every day. Keep taking it unless your care team tells you to stop. It is important that you put your used needles and syringes in a special sharps container. Do not put them in a trash can. If you do not have a sharps container, call your pharmacist or care team to get one. This medication comes with INSTRUCTIONS FOR USE. Ask your pharmacist for directions on how to use this medication. Read the information carefully. Talk to your pharmacist or care team if you have questions. Talk to your care team about the use of this  medication in children. While it may be prescribed for children for selected conditions, precautions do apply. Overdosage: If you think you have taken too much of this medicine contact a poison control center or emergency room at once. NOTE: This medicine is only for you. Do not share this medicine with others. What if I miss a dose? It is important not to miss any doses. Talk to your care team about what to do if you miss a dose. What may interact with this medication? Medications that may cause a release of neutrophils, such as lithium This list may not describe all possible interactions. Give your health care provider a list of all the medicines, herbs, non-prescription drugs, or dietary supplements you use. Also tell them if you smoke, drink alcohol, or use illegal drugs. Some items may interact with your medicine. What should I watch for while using this medication? Your condition will be monitored carefully while you are receiving this medication. You may need bloodwork while taking this medication. Talk to your care team about your risk of cancer. You may be more at risk for certain types of cancer if you take this medication. What side effects may I notice from receiving this medication? Side effects that you should report to your care team as soon as possible: Allergic reactions--skin rash, itching, hives, swelling of the face, lips, tongue, or throat Capillary leak syndrome--stomach or muscle pain, unusual weakness or fatigue, feeling faint or lightheaded, decrease in the amount of urine, swelling of the ankles, hands, or   feet, trouble breathing High white blood cell level--fever, fatigue, trouble breathing, night sweats, change in vision, weight loss Inflammation of the aorta--fever, fatigue, back, chest, or stomach pain, severe headache Kidney injury (glomerulonephritis)--decrease in the amount of urine, red or dark brown urine, foamy or bubbly urine, swelling of the ankles, hands, or  feet Shortness of breath or trouble breathing Spleen injury--pain in upper left stomach or shoulder Unusual bruising or bleeding Side effects that usually do not require medical attention (report to your care team if they continue or are bothersome): Back pain Bone pain Fatigue Fever Headache Nausea This list may not describe all possible side effects. Call your doctor for medical advice about side effects. You may report side effects to FDA at 1-800-FDA-1088. Where should I keep my medication? Keep out of the reach of children and pets. Keep this medication in the original packaging until you are ready to take it. Protect from light. See product for storage information. Each product may have different instructions. Get rid of any unused medication after the expiration date. To get rid of medications that are no longer needed or have expired: Take the medication to a medications take-back program. Check with your pharmacy or law enforcement to find a location. If you cannot return the medication, ask your pharmacist or care team how to get rid of this medication safely. NOTE: This sheet is a summary. It may not cover all possible information. If you have questions about this medicine, talk to your doctor, pharmacist, or health care provider.  2024 Elsevier/Gold Standard (2021-10-09 00:00:00)  

## 2023-06-15 ENCOUNTER — Inpatient Hospital Stay: Payer: 59

## 2023-06-15 VITALS — BP 110/70 | HR 95 | Temp 98.1°F | Resp 18

## 2023-06-15 DIAGNOSIS — D696 Thrombocytopenia, unspecified: Secondary | ICD-10-CM | POA: Diagnosis not present

## 2023-06-15 DIAGNOSIS — C7971 Secondary malignant neoplasm of right adrenal gland: Secondary | ICD-10-CM | POA: Diagnosis not present

## 2023-06-15 DIAGNOSIS — C189 Malignant neoplasm of colon, unspecified: Secondary | ICD-10-CM

## 2023-06-15 DIAGNOSIS — G629 Polyneuropathy, unspecified: Secondary | ICD-10-CM | POA: Diagnosis not present

## 2023-06-15 DIAGNOSIS — C7801 Secondary malignant neoplasm of right lung: Secondary | ICD-10-CM | POA: Diagnosis not present

## 2023-06-15 DIAGNOSIS — R110 Nausea: Secondary | ICD-10-CM | POA: Diagnosis not present

## 2023-06-15 DIAGNOSIS — C787 Secondary malignant neoplasm of liver and intrahepatic bile duct: Secondary | ICD-10-CM | POA: Diagnosis not present

## 2023-06-15 DIAGNOSIS — C7802 Secondary malignant neoplasm of left lung: Secondary | ICD-10-CM | POA: Diagnosis not present

## 2023-06-15 DIAGNOSIS — Z5189 Encounter for other specified aftercare: Secondary | ICD-10-CM | POA: Diagnosis not present

## 2023-06-15 DIAGNOSIS — Z5111 Encounter for antineoplastic chemotherapy: Secondary | ICD-10-CM | POA: Diagnosis not present

## 2023-06-15 DIAGNOSIS — C7931 Secondary malignant neoplasm of brain: Secondary | ICD-10-CM | POA: Diagnosis not present

## 2023-06-15 DIAGNOSIS — C182 Malignant neoplasm of ascending colon: Secondary | ICD-10-CM

## 2023-06-15 DIAGNOSIS — C786 Secondary malignant neoplasm of retroperitoneum and peritoneum: Secondary | ICD-10-CM | POA: Diagnosis not present

## 2023-06-15 MED ORDER — FILGRASTIM-SNDZ 480 MCG/0.8ML IJ SOSY
480.0000 ug | PREFILLED_SYRINGE | Freq: Once | INTRAMUSCULAR | Status: AC
  Administered 2023-06-15: 480 ug via SUBCUTANEOUS
  Filled 2023-06-15: qty 0.8

## 2023-06-15 NOTE — Patient Instructions (Signed)
Filgrastim Injection What is this medication? FILGRASTIM (fil GRA stim) lowers the risk of infection in people who are receiving chemotherapy. It works by helping your body make more white blood cells, which protects your body from infection. It may also be used to help people who have been exposed to high doses of radiation. It can be used to help prepare your body before a stem cell transplant. It works by helping your bone marrow make and release stem cells into the blood. This medicine may be used for other purposes; ask your health care provider or pharmacist if you have questions. COMMON BRAND NAME(S): Neupogen, Nivestym, Releuko, Zarxio What should I tell my care team before I take this medication? They need to know if you have any of these conditions: History of blood diseases, such as sickle cell anemia Kidney disease Recent or ongoing radiation An unusual or allergic reaction to filgrastim, pegfilgrastim, latex, rubber, other medications, foods, dyes, or preservatives Pregnant or trying to get pregnant Breast-feeding How should I use this medication? This medication is injected under the skin or into a vein. It is usually given by your care team in a hospital or clinic setting. It may be given at home. If you get this medication at home, you will be taught how to prepare and give it. Use exactly as directed. Take it as directed on the prescription label at the same time every day. Keep taking it unless your care team tells you to stop. It is important that you put your used needles and syringes in a special sharps container. Do not put them in a trash can. If you do not have a sharps container, call your pharmacist or care team to get one. This medication comes with INSTRUCTIONS FOR USE. Ask your pharmacist for directions on how to use this medication. Read the information carefully. Talk to your pharmacist or care team if you have questions. Talk to your care team about the use of this  medication in children. While it may be prescribed for children for selected conditions, precautions do apply. Overdosage: If you think you have taken too much of this medicine contact a poison control center or emergency room at once. NOTE: This medicine is only for you. Do not share this medicine with others. What if I miss a dose? It is important not to miss any doses. Talk to your care team about what to do if you miss a dose. What may interact with this medication? Medications that may cause a release of neutrophils, such as lithium This list may not describe all possible interactions. Give your health care provider a list of all the medicines, herbs, non-prescription drugs, or dietary supplements you use. Also tell them if you smoke, drink alcohol, or use illegal drugs. Some items may interact with your medicine. What should I watch for while using this medication? Your condition will be monitored carefully while you are receiving this medication. You may need bloodwork while taking this medication. Talk to your care team about your risk of cancer. You may be more at risk for certain types of cancer if you take this medication. What side effects may I notice from receiving this medication? Side effects that you should report to your care team as soon as possible: Allergic reactions--skin rash, itching, hives, swelling of the face, lips, tongue, or throat Capillary leak syndrome--stomach or muscle pain, unusual weakness or fatigue, feeling faint or lightheaded, decrease in the amount of urine, swelling of the ankles, hands, or   feet, trouble breathing High white blood cell level--fever, fatigue, trouble breathing, night sweats, change in vision, weight loss Inflammation of the aorta--fever, fatigue, back, chest, or stomach pain, severe headache Kidney injury (glomerulonephritis)--decrease in the amount of urine, red or dark brown urine, foamy or bubbly urine, swelling of the ankles, hands, or  feet Shortness of breath or trouble breathing Spleen injury--pain in upper left stomach or shoulder Unusual bruising or bleeding Side effects that usually do not require medical attention (report to your care team if they continue or are bothersome): Back pain Bone pain Fatigue Fever Headache Nausea This list may not describe all possible side effects. Call your doctor for medical advice about side effects. You may report side effects to FDA at 1-800-FDA-1088. Where should I keep my medication? Keep out of the reach of children and pets. Keep this medication in the original packaging until you are ready to take it. Protect from light. See product for storage information. Each product may have different instructions. Get rid of any unused medication after the expiration date. To get rid of medications that are no longer needed or have expired: Take the medication to a medications take-back program. Check with your pharmacy or law enforcement to find a location. If you cannot return the medication, ask your pharmacist or care team how to get rid of this medication safely. NOTE: This sheet is a summary. It may not cover all possible information. If you have questions about this medicine, talk to your doctor, pharmacist, or health care provider.  2024 Elsevier/Gold Standard (2021-10-09 00:00:00)  

## 2023-06-16 ENCOUNTER — Ambulatory Visit: Payer: 59

## 2023-06-16 ENCOUNTER — Inpatient Hospital Stay: Payer: 59

## 2023-06-16 VITALS — BP 113/67 | HR 93 | Temp 97.9°F | Resp 18

## 2023-06-16 DIAGNOSIS — C182 Malignant neoplasm of ascending colon: Secondary | ICD-10-CM

## 2023-06-16 DIAGNOSIS — C189 Malignant neoplasm of colon, unspecified: Secondary | ICD-10-CM

## 2023-06-16 DIAGNOSIS — R110 Nausea: Secondary | ICD-10-CM | POA: Diagnosis not present

## 2023-06-16 DIAGNOSIS — E1169 Type 2 diabetes mellitus with other specified complication: Secondary | ICD-10-CM | POA: Diagnosis not present

## 2023-06-16 DIAGNOSIS — C786 Secondary malignant neoplasm of retroperitoneum and peritoneum: Secondary | ICD-10-CM | POA: Diagnosis not present

## 2023-06-16 DIAGNOSIS — C7801 Secondary malignant neoplasm of right lung: Secondary | ICD-10-CM | POA: Diagnosis not present

## 2023-06-16 DIAGNOSIS — C7802 Secondary malignant neoplasm of left lung: Secondary | ICD-10-CM | POA: Diagnosis not present

## 2023-06-16 DIAGNOSIS — C787 Secondary malignant neoplasm of liver and intrahepatic bile duct: Secondary | ICD-10-CM | POA: Diagnosis not present

## 2023-06-16 DIAGNOSIS — C7931 Secondary malignant neoplasm of brain: Secondary | ICD-10-CM | POA: Diagnosis not present

## 2023-06-16 DIAGNOSIS — G629 Polyneuropathy, unspecified: Secondary | ICD-10-CM | POA: Diagnosis not present

## 2023-06-16 DIAGNOSIS — Z5189 Encounter for other specified aftercare: Secondary | ICD-10-CM | POA: Diagnosis not present

## 2023-06-16 DIAGNOSIS — Z5111 Encounter for antineoplastic chemotherapy: Secondary | ICD-10-CM | POA: Diagnosis not present

## 2023-06-16 DIAGNOSIS — D696 Thrombocytopenia, unspecified: Secondary | ICD-10-CM | POA: Diagnosis not present

## 2023-06-16 DIAGNOSIS — E039 Hypothyroidism, unspecified: Secondary | ICD-10-CM | POA: Diagnosis not present

## 2023-06-16 DIAGNOSIS — C7971 Secondary malignant neoplasm of right adrenal gland: Secondary | ICD-10-CM | POA: Diagnosis not present

## 2023-06-16 DIAGNOSIS — E7800 Pure hypercholesterolemia, unspecified: Secondary | ICD-10-CM | POA: Diagnosis not present

## 2023-06-16 MED ORDER — FILGRASTIM-SNDZ 480 MCG/0.8ML IJ SOSY
480.0000 ug | PREFILLED_SYRINGE | Freq: Once | INTRAMUSCULAR | Status: AC
Start: 2023-06-16 — End: 2023-06-16
  Administered 2023-06-16: 480 ug via SUBCUTANEOUS
  Filled 2023-06-16: qty 0.8

## 2023-06-16 NOTE — Patient Instructions (Signed)
Filgrastim Injection What is this medication? FILGRASTIM (fil GRA stim) lowers the risk of infection in people who are receiving chemotherapy. It works by helping your body make more white blood cells, which protects your body from infection. It may also be used to help people who have been exposed to high doses of radiation. It can be used to help prepare your body before a stem cell transplant. It works by helping your bone marrow make and release stem cells into the blood. This medicine may be used for other purposes; ask your health care provider or pharmacist if you have questions. COMMON BRAND NAME(S): Neupogen, Nivestym, Releuko, Zarxio What should I tell my care team before I take this medication? They need to know if you have any of these conditions: History of blood diseases, such as sickle cell anemia Kidney disease Recent or ongoing radiation An unusual or allergic reaction to filgrastim, pegfilgrastim, latex, rubber, other medications, foods, dyes, or preservatives Pregnant or trying to get pregnant Breast-feeding How should I use this medication? This medication is injected under the skin or into a vein. It is usually given by your care team in a hospital or clinic setting. It may be given at home. If you get this medication at home, you will be taught how to prepare and give it. Use exactly as directed. Take it as directed on the prescription label at the same time every day. Keep taking it unless your care team tells you to stop. It is important that you put your used needles and syringes in a special sharps container. Do not put them in a trash can. If you do not have a sharps container, call your pharmacist or care team to get one. This medication comes with INSTRUCTIONS FOR USE. Ask your pharmacist for directions on how to use this medication. Read the information carefully. Talk to your pharmacist or care team if you have questions. Talk to your care team about the use of this  medication in children. While it may be prescribed for children for selected conditions, precautions do apply. Overdosage: If you think you have taken too much of this medicine contact a poison control center or emergency room at once. NOTE: This medicine is only for you. Do not share this medicine with others. What if I miss a dose? It is important not to miss any doses. Talk to your care team about what to do if you miss a dose. What may interact with this medication? Medications that may cause a release of neutrophils, such as lithium This list may not describe all possible interactions. Give your health care provider a list of all the medicines, herbs, non-prescription drugs, or dietary supplements you use. Also tell them if you smoke, drink alcohol, or use illegal drugs. Some items may interact with your medicine. What should I watch for while using this medication? Your condition will be monitored carefully while you are receiving this medication. You may need bloodwork while taking this medication. Talk to your care team about your risk of cancer. You may be more at risk for certain types of cancer if you take this medication. What side effects may I notice from receiving this medication? Side effects that you should report to your care team as soon as possible: Allergic reactions--skin rash, itching, hives, swelling of the face, lips, tongue, or throat Capillary leak syndrome--stomach or muscle pain, unusual weakness or fatigue, feeling faint or lightheaded, decrease in the amount of urine, swelling of the ankles, hands, or   feet, trouble breathing High white blood cell level--fever, fatigue, trouble breathing, night sweats, change in vision, weight loss Inflammation of the aorta--fever, fatigue, back, chest, or stomach pain, severe headache Kidney injury (glomerulonephritis)--decrease in the amount of urine, red or dark brown urine, foamy or bubbly urine, swelling of the ankles, hands, or  feet Shortness of breath or trouble breathing Spleen injury--pain in upper left stomach or shoulder Unusual bruising or bleeding Side effects that usually do not require medical attention (report to your care team if they continue or are bothersome): Back pain Bone pain Fatigue Fever Headache Nausea This list may not describe all possible side effects. Call your doctor for medical advice about side effects. You may report side effects to FDA at 1-800-FDA-1088. Where should I keep my medication? Keep out of the reach of children and pets. Keep this medication in the original packaging until you are ready to take it. Protect from light. See product for storage information. Each product may have different instructions. Get rid of any unused medication after the expiration date. To get rid of medications that are no longer needed or have expired: Take the medication to a medications take-back program. Check with your pharmacy or law enforcement to find a location. If you cannot return the medication, ask your pharmacist or care team how to get rid of this medication safely. NOTE: This sheet is a summary. It may not cover all possible information. If you have questions about this medicine, talk to your doctor, pharmacist, or health care provider.  2024 Elsevier/Gold Standard (2021-10-09 00:00:00)  

## 2023-06-24 DIAGNOSIS — H35372 Puckering of macula, left eye: Secondary | ICD-10-CM | POA: Diagnosis not present

## 2023-06-25 ENCOUNTER — Encounter: Payer: Self-pay | Admitting: Oncology

## 2023-06-27 NOTE — Progress Notes (Unsigned)
[facility]  [address] [place],  [place]  [address] [phone]  Clinic Day:  05/17/2023  Referring physician: Alinda Deem, MD  HISTORY OF PRESENT ILLNESS:  The patient is a 58 y.o. female with  metastatic colon cancer, which includes spread of disease to her abdominal cavity, lungs, brain and right adrenal gland.  She comes in today to be evaluated before heading into her 20th cycle of FOLFOX/Avastin.  Of note, her cycles of chemotherapy were recently spaced out to every 3 weeks.  Since her last visit, the patient has been doing okay.  She has chronic GI issues, including intermittent diarrhea, as well as occasional vomiting.  However, she denies having any new GI symptoms which concern her for overt signs of disease progression.   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  With respect to her colon cancer history, she is status post a right hemicolectomy in early October 2018, followed by 12 cycles of adjuvant FOLFOX chemotherapy, which were completed in April 2019.  In December 2021, she underwent a left cerebellar metastasectomy, whose pathology was consistent with metastatic colon cancer.  Tumor testing did come back MMR normal. CT scans revealed evidence of her cancer being in multiple locations, for which she took 40 cycles of FOLFIRI/Avastin.  As follow-up scans revealed liver metastasis, she was placed back on FOLFOX/Avastin, for which she continues to take.  PHYSICAL EXAM:  Last menstrual period 02/22/2019. There is no height or weight on file to calculate BMI.  Performance status (ECOG): 1 - Symptomatic but completely ambulatory  Physical Exam Vitals and nursing note reviewed.  Constitutional:      General: She is not in acute distress.    Appearance: Normal appearance.  HENT:     Head: Normocephalic and atraumatic.     Mouth/Throat:     Mouth: Mucous membranes  are moist.     Pharynx: Oropharynx is clear. No oropharyngeal exudate or posterior oropharyngeal erythema.  Eyes:     General: No scleral icterus.    Extraocular Movements: Extraocular movements intact.     Conjunctiva/sclera: Conjunctivae normal.     Pupils: Pupils are equal, round, and reactive to light.  Cardiovascular:     Rate and Rhythm: Normal rate and regular rhythm.     Heart sounds: Normal heart sounds. No murmur heard.    No friction rub. No gallop.  Pulmonary:     Effort: Pulmonary effort is normal.     Breath sounds: Normal breath sounds. No wheezing, rhonchi or rales.  Abdominal:     General: There is no distension.     Palpations: Abdomen is soft. There is no hepatomegaly, splenomegaly or mass.     Tenderness: There is no abdominal tenderness.  Musculoskeletal:        General: Normal range of motion.     Cervical back: Normal range of motion and neck supple. No tenderness.     Right lower leg: No edema.     Left lower leg: No edema.  Lymphadenopathy:     Cervical: No cervical adenopathy.     Upper Body:     Right upper body: No supraclavicular or axillary adenopathy.     Left upper body: No supraclavicular or axillary adenopathy.     Lower Body: No right inguinal adenopathy. No left inguinal adenopathy.  Skin:    General: Skin is warm and dry.     Coloration:  Skin is not jaundiced.     Findings: No rash.  Neurological:     Mental Status: She is alert and oriented to person, place, and time.     Cranial Nerves: No cranial nerve deficit.  Psychiatric:        Mood and Affect: Mood normal.        Behavior: Behavior normal.        Thought Content: Thought content normal.    LABS:      Latest Ref Rng & Units 06/07/2023   10:35 AM 05/17/2023   11:18 AM 04/26/2023   11:44 AM  CBC  WBC 4.0 - 10.5 K/uL 5.4  4.2  3.2   Hemoglobin 12.0 - 15.0 g/dL 16.1  09.6  04.5   Hematocrit 36.0 - 46.0 % 38.2  38.8  36.1   Platelets 150 - 400 K/uL 51  55  53       Latest  Ref Rng & Units 06/07/2023   10:35 AM 05/17/2023   11:18 AM 04/26/2023   11:44 AM  CMP  Glucose 70 - 99 mg/dL 409  811  914   BUN 6 - 20 mg/dL 13  9  12    Creatinine 0.44 - 1.00 mg/dL 7.82  9.56  2.13   Sodium 135 - 145 mmol/L 140  143  141   Potassium 3.5 - 5.1 mmol/L 4.5  4.6  4.0   Chloride 98 - 111 mmol/L 102  104  105   CO2 22 - 32 mmol/L 26  28  24    Calcium 8.9 - 10.3 mg/dL 9.7  9.7  9.6   Total Protein 6.5 - 8.1 g/dL 6.3  6.2  6.1   Total Bilirubin 0.0 - 1.2 mg/dL 0.5  0.5  0.3   Alkaline Phos 38 - 126 U/L 180  205  180   AST 15 - 41 U/L 28  43  30   ALT 0 - 44 U/L 20  45  34    ASSESSMENT & PLAN:  Assessment/Plan:  A 58 y.o. female with metastatic colon cancer.  She will proceed with her 19th cycle of FOLFOX/Avastin today.  As mentioned previously, all of her chemotherapy has been spaced out to every 3 weeks.  Clinically, she seems to be doing fairly well.  I will see her back in 3 weeks before she heads into her 20th cycle of FOLFOX/Avastin. The patient understands all the plans discussed today and is in agreement with them.  Chevez Sambrano Kirby Funk, MD

## 2023-06-28 ENCOUNTER — Other Ambulatory Visit: Payer: Self-pay | Admitting: Oncology

## 2023-06-28 ENCOUNTER — Inpatient Hospital Stay: Payer: 59

## 2023-06-28 ENCOUNTER — Encounter: Payer: Self-pay | Admitting: Oncology

## 2023-06-28 ENCOUNTER — Inpatient Hospital Stay (HOSPITAL_BASED_OUTPATIENT_CLINIC_OR_DEPARTMENT_OTHER): Payer: 59 | Admitting: Oncology

## 2023-06-28 VITALS — BP 106/73 | HR 95 | Temp 98.0°F | Resp 18 | Ht 68.0 in | Wt 145.0 lb

## 2023-06-28 DIAGNOSIS — C182 Malignant neoplasm of ascending colon: Secondary | ICD-10-CM

## 2023-06-28 DIAGNOSIS — C7971 Secondary malignant neoplasm of right adrenal gland: Secondary | ICD-10-CM | POA: Diagnosis not present

## 2023-06-28 DIAGNOSIS — Z5111 Encounter for antineoplastic chemotherapy: Secondary | ICD-10-CM | POA: Diagnosis not present

## 2023-06-28 DIAGNOSIS — C7801 Secondary malignant neoplasm of right lung: Secondary | ICD-10-CM | POA: Diagnosis not present

## 2023-06-28 DIAGNOSIS — R110 Nausea: Secondary | ICD-10-CM | POA: Insufficient documentation

## 2023-06-28 DIAGNOSIS — E860 Dehydration: Secondary | ICD-10-CM

## 2023-06-28 DIAGNOSIS — C7802 Secondary malignant neoplasm of left lung: Secondary | ICD-10-CM | POA: Diagnosis not present

## 2023-06-28 DIAGNOSIS — C7800 Secondary malignant neoplasm of unspecified lung: Secondary | ICD-10-CM

## 2023-06-28 DIAGNOSIS — C787 Secondary malignant neoplasm of liver and intrahepatic bile duct: Secondary | ICD-10-CM | POA: Diagnosis not present

## 2023-06-28 DIAGNOSIS — C786 Secondary malignant neoplasm of retroperitoneum and peritoneum: Secondary | ICD-10-CM | POA: Diagnosis not present

## 2023-06-28 DIAGNOSIS — G629 Polyneuropathy, unspecified: Secondary | ICD-10-CM | POA: Diagnosis not present

## 2023-06-28 DIAGNOSIS — C189 Malignant neoplasm of colon, unspecified: Secondary | ICD-10-CM | POA: Diagnosis not present

## 2023-06-28 DIAGNOSIS — C7931 Secondary malignant neoplasm of brain: Secondary | ICD-10-CM | POA: Diagnosis not present

## 2023-06-28 DIAGNOSIS — D696 Thrombocytopenia, unspecified: Secondary | ICD-10-CM | POA: Diagnosis not present

## 2023-06-28 DIAGNOSIS — Z5189 Encounter for other specified aftercare: Secondary | ICD-10-CM | POA: Diagnosis not present

## 2023-06-28 LAB — CMP (CANCER CENTER ONLY)
ALT: 31 U/L (ref 0–44)
AST: 36 U/L (ref 15–41)
Albumin: 3.8 g/dL (ref 3.5–5.0)
Alkaline Phosphatase: 185 U/L — ABNORMAL HIGH (ref 38–126)
Anion gap: 11 (ref 5–15)
BUN: 12 mg/dL (ref 6–20)
CO2: 27 mmol/L (ref 22–32)
Calcium: 9.5 mg/dL (ref 8.9–10.3)
Chloride: 103 mmol/L (ref 98–111)
Creatinine: 1.27 mg/dL — ABNORMAL HIGH (ref 0.44–1.00)
GFR, Estimated: 50 mL/min — ABNORMAL LOW (ref >60)
Glucose, Bld: 142 mg/dL — ABNORMAL HIGH (ref 70–99)
Potassium: 4.6 mmol/L (ref 3.5–5.1)
Sodium: 141 mmol/L (ref 135–145)
Total Bilirubin: 0.4 mg/dL (ref 0.0–1.2)
Total Protein: 6.1 g/dL — ABNORMAL LOW (ref 6.5–8.1)

## 2023-06-28 LAB — CBC WITH DIFFERENTIAL (CANCER CENTER ONLY)
Abs Immature Granulocytes: 0.01 10*3/uL (ref 0.00–0.07)
Basophils Absolute: 0 10*3/uL (ref 0.0–0.1)
Basophils Relative: 0 %
Eosinophils Absolute: 0 10*3/uL (ref 0.0–0.5)
Eosinophils Relative: 1 %
HCT: 36.5 % (ref 36.0–46.0)
Hemoglobin: 12 g/dL (ref 12.0–15.0)
Immature Granulocytes: 0 %
Immature Platelet Fraction: 2.4 % (ref 1.2–8.6)
Lymphocytes Relative: 24 %
Lymphs Abs: 0.8 10*3/uL (ref 0.7–4.0)
MCH: 34.1 pg — ABNORMAL HIGH (ref 26.0–34.0)
MCHC: 32.9 g/dL (ref 30.0–36.0)
MCV: 103.7 fL — ABNORMAL HIGH (ref 80.0–100.0)
Monocytes Absolute: 0.3 10*3/uL (ref 0.1–1.0)
Monocytes Relative: 10 %
Neutro Abs: 2.2 10*3/uL (ref 1.7–7.7)
Neutrophils Relative %: 65 %
Platelet Count: 43 10*3/uL — ABNORMAL LOW (ref 150–400)
RBC: 3.52 MIL/uL — ABNORMAL LOW (ref 3.87–5.11)
RDW: 14.6 % (ref 11.5–15.5)
WBC Count: 3.3 10*3/uL — ABNORMAL LOW (ref 4.0–10.5)
nRBC: 0 % (ref 0.0–0.2)
nRBC: 0 /100{WBCs}

## 2023-06-28 MED ORDER — SODIUM CHLORIDE 0.9 % IV SOLN
Freq: Once | INTRAVENOUS | Status: AC
Start: 2023-06-28 — End: 2023-06-28

## 2023-06-28 MED ORDER — SODIUM CHLORIDE 0.9 % IV SOLN
20.0000 mg | Freq: Once | INTRAVENOUS | Status: AC
  Administered 2023-06-28: 20 mg via INTRAVENOUS
  Filled 2023-06-28: qty 20

## 2023-06-28 MED ORDER — SODIUM CHLORIDE 0.9% FLUSH
10.0000 mL | Freq: Once | INTRAVENOUS | Status: AC | PRN
  Administered 2023-06-28: 10 mL

## 2023-06-28 MED ORDER — ONDANSETRON HCL 4 MG/2ML IJ SOLN
8.0000 mg | INTRAMUSCULAR | Status: DC | PRN
  Administered 2023-06-28: 8 mg via INTRAVENOUS
  Filled 2023-06-28: qty 4

## 2023-06-28 MED ORDER — HEPARIN SOD (PORK) LOCK FLUSH 100 UNIT/ML IV SOLN
500.0000 [IU] | Freq: Once | INTRAVENOUS | Status: AC | PRN
  Administered 2023-06-28: 500 [IU]

## 2023-06-28 NOTE — Patient Instructions (Signed)

## 2023-06-30 ENCOUNTER — Inpatient Hospital Stay: Payer: 59

## 2023-06-30 ENCOUNTER — Encounter: Payer: Self-pay | Admitting: Oncology

## 2023-07-01 ENCOUNTER — Telehealth: Payer: Self-pay | Admitting: Dietician

## 2023-07-01 NOTE — Telephone Encounter (Signed)
Patient screened on [facility]ST. First attempt to reach. She was at a restaurant unable to talk. Set up a remote nutrition consult for next week.  Gennaro Africa, RDN, LDN Registered Dietitian, [facility] Part Time Remote (Usual office hours: Tuesday-Thursday) Cell: [phone]

## 2023-07-05 ENCOUNTER — Inpatient Hospital Stay: Payer: 59 | Attending: Oncology

## 2023-07-05 ENCOUNTER — Other Ambulatory Visit: Payer: Self-pay | Admitting: Pharmacist

## 2023-07-05 ENCOUNTER — Inpatient Hospital Stay: Payer: 59

## 2023-07-05 ENCOUNTER — Other Ambulatory Visit: Payer: Self-pay

## 2023-07-05 ENCOUNTER — Other Ambulatory Visit: Payer: Self-pay | Admitting: Oncology

## 2023-07-05 VITALS — BP 122/86 | HR 97 | Temp 97.8°F | Resp 20 | Ht 68.0 in | Wt 154.7 lb

## 2023-07-05 DIAGNOSIS — C7971 Secondary malignant neoplasm of right adrenal gland: Secondary | ICD-10-CM | POA: Insufficient documentation

## 2023-07-05 DIAGNOSIS — C7800 Secondary malignant neoplasm of unspecified lung: Secondary | ICD-10-CM

## 2023-07-05 DIAGNOSIS — Z5111 Encounter for antineoplastic chemotherapy: Secondary | ICD-10-CM | POA: Diagnosis not present

## 2023-07-05 DIAGNOSIS — C182 Malignant neoplasm of ascending colon: Secondary | ICD-10-CM

## 2023-07-05 DIAGNOSIS — C7931 Secondary malignant neoplasm of brain: Secondary | ICD-10-CM | POA: Diagnosis not present

## 2023-07-05 DIAGNOSIS — D696 Thrombocytopenia, unspecified: Secondary | ICD-10-CM | POA: Insufficient documentation

## 2023-07-05 DIAGNOSIS — C7801 Secondary malignant neoplasm of right lung: Secondary | ICD-10-CM | POA: Diagnosis not present

## 2023-07-05 DIAGNOSIS — C189 Malignant neoplasm of colon, unspecified: Secondary | ICD-10-CM | POA: Diagnosis not present

## 2023-07-05 DIAGNOSIS — C786 Secondary malignant neoplasm of retroperitoneum and peritoneum: Secondary | ICD-10-CM | POA: Diagnosis not present

## 2023-07-05 DIAGNOSIS — Z5189 Encounter for other specified aftercare: Secondary | ICD-10-CM | POA: Diagnosis not present

## 2023-07-05 DIAGNOSIS — G629 Polyneuropathy, unspecified: Secondary | ICD-10-CM | POA: Diagnosis not present

## 2023-07-05 DIAGNOSIS — C7802 Secondary malignant neoplasm of left lung: Secondary | ICD-10-CM | POA: Insufficient documentation

## 2023-07-05 LAB — CBC WITH DIFFERENTIAL (CANCER CENTER ONLY)
Abs Immature Granulocytes: 0.01 10*3/uL (ref 0.00–0.07)
Basophils Absolute: 0 10*3/uL (ref 0.0–0.1)
Basophils Relative: 0 %
Eosinophils Absolute: 0 10*3/uL (ref 0.0–0.5)
Eosinophils Relative: 1 %
HCT: 34.3 % — ABNORMAL LOW (ref 36.0–46.0)
Hemoglobin: 11.1 g/dL — ABNORMAL LOW (ref 12.0–15.0)
Immature Granulocytes: 0 %
Immature Platelet Fraction: 2.6 % (ref 1.2–8.6)
Lymphocytes Relative: 25 %
Lymphs Abs: 0.8 10*3/uL (ref 0.7–4.0)
MCH: 34 pg (ref 26.0–34.0)
MCHC: 32.4 g/dL (ref 30.0–36.0)
MCV: 105.2 fL — ABNORMAL HIGH (ref 80.0–100.0)
Monocytes Absolute: 0.5 10*3/uL (ref 0.1–1.0)
Monocytes Relative: 15 %
Neutro Abs: 2 10*3/uL (ref 1.7–7.7)
Neutrophils Relative %: 59 %
Platelet Count: 44 10*3/uL — ABNORMAL LOW (ref 150–400)
RBC: 3.26 MIL/uL — ABNORMAL LOW (ref 3.87–5.11)
RDW: 14.1 % (ref 11.5–15.5)
WBC Count: 3.4 10*3/uL — ABNORMAL LOW (ref 4.0–10.5)
nRBC: 0 % (ref 0.0–0.2)
nRBC: 0 /100{WBCs}

## 2023-07-05 LAB — CMP (CANCER CENTER ONLY)
ALT: 32 U/L (ref 0–44)
AST: 34 U/L (ref 15–41)
Albumin: 3.6 g/dL (ref 3.5–5.0)
Alkaline Phosphatase: 179 U/L — ABNORMAL HIGH (ref 38–126)
Anion gap: 9 (ref 5–15)
BUN: 18 mg/dL (ref 6–20)
CO2: 27 mmol/L (ref 22–32)
Calcium: 9.3 mg/dL (ref 8.9–10.3)
Chloride: 102 mmol/L (ref 98–111)
Creatinine: 1.35 mg/dL — ABNORMAL HIGH (ref 0.44–1.00)
GFR, Estimated: 46 mL/min — ABNORMAL LOW (ref >60)
Glucose, Bld: 337 mg/dL — ABNORMAL HIGH (ref 70–99)
Potassium: 4.6 mmol/L (ref 3.5–5.1)
Sodium: 138 mmol/L (ref 135–145)
Total Bilirubin: 0.2 mg/dL (ref 0.0–1.2)
Total Protein: 5.7 g/dL — ABNORMAL LOW (ref 6.5–8.1)

## 2023-07-05 LAB — ABO/RH: ABO/RH(D): A POS

## 2023-07-05 LAB — TOTAL PROTEIN, URINE DIPSTICK: Protein, ur: NEGATIVE mg/dL

## 2023-07-05 MED ORDER — SODIUM CHLORIDE 0.9 % IV SOLN: Freq: Once | INTRAVENOUS | Status: AC

## 2023-07-05 MED ORDER — DIPHENHYDRAMINE HCL 50 MG/ML IJ SOLN
25.0000 mg | Freq: Once | INTRAMUSCULAR | Status: AC
  Administered 2023-07-05: 25 mg via INTRAVENOUS
  Filled 2023-07-05: qty 1

## 2023-07-05 MED ORDER — FAMOTIDINE IN NACL 20-0.9 MG/50ML-% IV SOLN
20.0000 mg | Freq: Once | INTRAVENOUS | Status: AC
  Administered 2023-07-05: 20 mg via INTRAVENOUS
  Filled 2023-07-05: qty 50

## 2023-07-05 MED ORDER — DEXAMETHASONE SODIUM PHOSPHATE 10 MG/ML IJ SOLN
10.0000 mg | Freq: Once | INTRAMUSCULAR | Status: AC
  Administered 2023-07-05: 10 mg via INTRAVENOUS
  Filled 2023-07-05: qty 1

## 2023-07-05 MED ORDER — PALONOSETRON HCL INJECTION 0.25 MG/5ML
0.2500 mg | Freq: Once | INTRAVENOUS | Status: AC
  Administered 2023-07-05: 0.25 mg via INTRAVENOUS
  Filled 2023-07-05: qty 5

## 2023-07-05 MED ORDER — SODIUM CHLORIDE 0.9 % IV SOLN
2400.0000 mg/m2 | INTRAVENOUS | Status: DC
  Administered 2023-07-05: 4350 mg via INTRAVENOUS
  Filled 2023-07-05: qty 87

## 2023-07-05 MED ORDER — SODIUM CHLORIDE 0.9 % IV SOLN
5.0000 mg/kg | Freq: Once | INTRAVENOUS | Status: AC
  Administered 2023-07-05: 350 mg via INTRAVENOUS
  Filled 2023-07-05: qty 14

## 2023-07-05 MED ORDER — DEXTROSE 5 % IV SOLN
Freq: Once | INTRAVENOUS | Status: DC
Start: 2023-07-05 — End: 2023-07-05

## 2023-07-05 MED ORDER — DEXTROSE 5 % IV SOLN: Freq: Once | INTRAVENOUS | Status: AC

## 2023-07-05 MED ORDER — DEXTROSE 5 % IV SOLN
63.7500 mg/m2 | Freq: Once | INTRAVENOUS | Status: AC
  Administered 2023-07-05: 115 mg via INTRAVENOUS
  Filled 2023-07-05: qty 20

## 2023-07-05 MED ORDER — FLUOROURACIL CHEMO INJECTION 2.5 GM/50ML
400.0000 mg/m2 | Freq: Once | INTRAVENOUS | Status: AC
  Administered 2023-07-05: 700 mg via INTRAVENOUS
  Filled 2023-07-05: qty 14

## 2023-07-05 MED ORDER — SODIUM CHLORIDE 0.9 % IV SOLN
150.0000 mg | Freq: Once | INTRAVENOUS | Status: AC
  Administered 2023-07-05: 150 mg via INTRAVENOUS
  Filled 2023-07-05: qty 150

## 2023-07-05 NOTE — Progress Notes (Signed)
Ok to proceed with chemo today despite plt=44 per Dr. Melvyn Neth.  Patient will receive 1 unit of platelets with pump d/c on Wednesday.

## 2023-07-05 NOTE — Patient Instructions (Signed)
Fluorouracil Injection What is this medication? FLUOROURACIL (flure oh YOOR a sil) treats some types of cancer. It works by slowing down the growth of cancer cells. This medicine may be used for other purposes; ask your health care provider or pharmacist if you have questions. COMMON BRAND NAME(S): Adrucil What should I tell my care team before I take this medication? They need to know if you have any of these conditions: Blood disorders Dihydropyrimidine dehydrogenase (DPD) deficiency Infection, such as chickenpox, cold sores, herpes Kidney disease Liver disease Poor nutrition Recent or ongoing radiation therapy An unusual or allergic reaction to fluorouracil, other medications, foods, dyes, or preservatives If you or your partner are pregnant or trying to get pregnant Breast-feeding How should I use this medication? This medication is injected into a vein. It is administered by your care team in a hospital or clinic setting. Talk to your care team about the use of this medication in children. Special care may be needed. Overdosage: If you think you have taken too much of this medicine contact a poison control center or emergency room at once. NOTE: This medicine is only for you. Do not share this medicine with others. What if I miss a dose? Keep appointments for follow-up doses. It is important not to miss your dose. Call your care team if you are unable to keep an appointment. What may interact with this medication? Do not take this medication with any of the following: Live virus vaccines This medication may also interact with the following: Medications that treat or prevent blood clots, such as warfarin, enoxaparin, dalteparin This list may not describe all possible interactions. Give your health care provider a list of all the medicines, herbs, non-prescription drugs, or dietary supplements you use. Also tell them if you smoke, drink alcohol, or use illegal drugs. Some items may  interact with your medicine. What should I watch for while using this medication? Your condition will be monitored carefully while you are receiving this medication. This medication may make you feel generally unwell. This is not uncommon as chemotherapy can affect healthy cells as well as cancer cells. Report any side effects. Continue your course of treatment even though you feel ill unless your care team tells you to stop. In some cases, you may be given additional medications to help with side effects. Follow all directions for their use. This medication may increase your risk of getting an infection. Call your care team for advice if you get a fever, chills, sore throat, or other symptoms of a cold or flu. Do not treat yourself. Try to avoid being around people who are sick. This medication may increase your risk to bruise or bleed. Call your care team if you notice any unusual bleeding. Be careful brushing or flossing your teeth or using a toothpick because you may get an infection or bleed more easily. If you have any dental work done, tell your dentist you are receiving this medication. Avoid taking medications that contain aspirin, acetaminophen, ibuprofen, naproxen, or ketoprofen unless instructed by your care team. These medications may hide a fever. Do not treat diarrhea with over the counter products. Contact your care team if you have diarrhea that lasts more than 2 days or if it is severe and watery. This medication can make you more sensitive to the sun. Keep out of the sun. If you cannot avoid being in the sun, wear protective clothing and sunscreen. Do not use sun lamps, tanning beds, or tanning booths. Talk to  your care team if you or your partner wish to become pregnant or think you might be pregnant. This medication can cause serious birth defects if taken during pregnancy and for 3 months after the last dose. A reliable form of contraception is recommended while taking this  medication and for 3 months after the last dose. Talk to your care team about effective forms of contraception. Do not father a child while taking this medication and for 3 months after the last dose. Use a condom while having sex during this time period. Do not breastfeed while taking this medication. This medication may cause infertility. Talk to your care team if you are concerned about your fertility. What side effects may I notice from receiving this medication? Side effects that you should report to your care team as soon as possible: Allergic reactions--skin rash, itching, hives, swelling of the face, lips, tongue, or throat Heart attack--pain or tightness in the chest, shoulders, arms, or jaw, nausea, shortness of breath, cold or clammy skin, feeling faint or lightheaded Heart failure--shortness of breath, swelling of the ankles, feet, or hands, sudden weight gain, unusual weakness or fatigue Heart rhythm changes--fast or irregular heartbeat, dizziness, feeling faint or lightheaded, chest pain, trouble breathing High ammonia level--unusual weakness or fatigue, confusion, loss of appetite, nausea, vomiting, seizures Infection--fever, chills, cough, sore throat, wounds that don't heal, pain or trouble when passing urine, general feeling of discomfort or being unwell Low red blood cell level--unusual weakness or fatigue, dizziness, headache, trouble breathing Pain, tingling, or numbness in the hands or feet, muscle weakness, change in vision, confusion or trouble speaking, loss of balance or coordination, trouble walking, seizures Redness, swelling, and blistering of the skin over hands and feet Severe or prolonged diarrhea Unusual bruising or bleeding Side effects that usually do not require medical attention (report to your care team if they continue or are bothersome): Dry skin Headache Increased tears Nausea Pain, redness, or swelling with sores inside the mouth or throat Sensitivity  to light Vomiting This list may not describe all possible side effects. Call your doctor for medical advice about side effects. You may report side effects to FDA at 1-800-FDA-1088. Where should I keep my medication? This medication is given in a hospital or clinic. It will not be stored at home. NOTE: This sheet is a summary. It may not cover all possible information. If you have questions about this medicine, talk to your doctor, pharmacist, or health care provider.  2024 Elsevier/Gold Standard (2021-09-23 00:00:00)Bevacizumab Injection What is this medication? BEVACIZUMAB (be va SIZ yoo mab) treats some types of cancer. It works by blocking a protein that causes cancer cells to grow and multiply. This helps to slow or stop the spread of cancer cells. It is a monoclonal antibody. This medicine may be used for other purposes; ask your health care provider or pharmacist if you have questions. COMMON BRAND NAME(S): Alymsys, Avastin, MVASI, Rosaland Lao What should I tell my care team before I take this medication? They need to know if you have any of these conditions: Blood clots Coughing up blood Having or recent surgery Heart failure High blood pressure History of a connection between 2 or more body parts that do not usually connect (fistula) History of a tear in your stomach or intestines Protein in your urine An unusual or allergic reaction to bevacizumab, other medications, foods, dyes, or preservatives Pregnant or trying to get pregnant Breast-feeding How should I use this medication? This medication is injected into a  vein. It is given by your care team in a hospital or clinic setting. Talk to your care team the use of this medication in children. Special care may be needed. Overdosage: If you think you have taken too much of this medicine contact a poison control center or emergency room at once. NOTE: This medicine is only for you. Do not share this medicine with  others. What if I miss a dose? Keep appointments for follow-up doses. It is important not to miss your dose. Call your care team if you are unable to keep an appointment. What may interact with this medication? Interactions are not expected. This list may not describe all possible interactions. Give your health care provider a list of all the medicines, herbs, non-prescription drugs, or dietary supplements you use. Also tell them if you smoke, drink alcohol, or use illegal drugs. Some items may interact with your medicine. What should I watch for while using this medication? Your condition will be monitored carefully while you are receiving this medication. You may need blood work while taking this medication. This medication may make you feel generally unwell. This is not uncommon as chemotherapy can affect healthy cells as well as cancer cells. Report any side effects. Continue your course of treatment even though you feel ill unless your care team tells you to stop. This medication may increase your risk to bruise or bleed. Call your care team if you notice any unusual bleeding. Before having surgery, talk to your care team to make sure it is ok. This medication can increase the risk of poor healing of your surgical site or wound. You will need to stop this medication for 28 days before surgery. After surgery, wait at least 28 days before restarting this medication. Make sure the surgical site or wound is healed enough before restarting this medication. Talk to your care team if questions. Talk to your care team if you may be pregnant. Serious birth defects can occur if you take this medication during pregnancy and for 6 months after the last dose. Contraception is recommended while taking this medication and for 6 months after the last dose. Your care team can help you find the option that works for you. Do not breastfeed while taking this medication and for 6 months after the last dose. This  medication can cause infertility. Talk to your care team if you are concerned about your fertility. What side effects may I notice from receiving this medication? Side effects that you should report to your care team as soon as possible: Allergic reactions--skin rash, itching, hives, swelling of the face, lips, tongue, or throat Bleeding--bloody or black, tar-like stools, vomiting blood or brown material that looks like coffee grounds, red or dark brown urine, small red or purple spots on skin, unusual bruising or bleeding Blood clot--pain, swelling, or warmth in the leg, shortness of breath, chest pain Heart attack--pain or tightness in the chest, shoulders, arms, or jaw, nausea, shortness of breath, cold or clammy skin, feeling faint or lightheaded Heart failure--shortness of breath, swelling of the ankles, feet, or hands, sudden weight gain, unusual weakness or fatigue Increase in blood pressure Infection--fever, chills, cough, sore throat, wounds that don't heal, pain or trouble when passing urine, general feeling of discomfort or being unwell Infusion reactions--chest pain, shortness of breath or trouble breathing, feeling faint or lightheaded Kidney injury--decrease in the amount of urine, swelling of the ankles, hands, or feet Stomach pain that is severe, does not go away, or gets  worse Stroke--sudden numbness or weakness of the face, arm, or leg, trouble speaking, confusion, trouble walking, loss of balance or coordination, dizziness, severe headache, change in vision Sudden and severe headache, confusion, change in vision, seizures, which may be signs of posterior reversible encephalopathy syndrome (PRES) Side effects that usually do not require medical attention (report to your care team if they continue or are bothersome): Back pain Change in taste Diarrhea Dry skin Increased tears Nosebleed This list may not describe all possible side effects. Call your doctor for medical advice  about side effects. You may report side effects to FDA at 1-800-FDA-1088. Where should I keep my medication? This medication is given in a hospital or clinic. It will not be stored at home. NOTE: This sheet is a summary. It may not cover all possible information. If you have questions about this medicine, talk to your doctor, pharmacist, or health care provider.  2024 Elsevier/Gold Standard (2021-10-03 00:00:00)Oxaliplatin Injection What is this medication? OXALIPLATIN (ox AL i PLA tin) treats colorectal cancer. It works by slowing down the growth of cancer cells. This medicine may be used for other purposes; ask your health care provider or pharmacist if you have questions. COMMON BRAND NAME(S): Eloxatin What should I tell my care team before I take this medication? They need to know if you have any of these conditions: Heart disease History of irregular heartbeat or rhythm Liver disease Low blood cell levels (white cells, red cells, and platelets) Lung or breathing disease, such as asthma Take medications that treat or prevent blood clots Tingling of the fingers, toes, or other nerve disorder An unusual or allergic reaction to oxaliplatin, other medications, foods, dyes, or preservatives If you or your partner are pregnant or trying to get pregnant Breast-feeding How should I use this medication? This medication is injected into a vein. It is given by your care team in a hospital or clinic setting. Talk to your care team about the use of this medication in children. Special care may be needed. Overdosage: If you think you have taken too much of this medicine contact a poison control center or emergency room at once. NOTE: This medicine is only for you. Do not share this medicine with others. What if I miss a dose? Keep appointments for follow-up doses. It is important not to miss a dose. Call your care team if you are unable to keep an appointment. What may interact with this  medication? Do not take this medication with any of the following: Cisapride Dronedarone Pimozide Thioridazine This medication may also interact with the following: Aspirin and aspirin-like medications Certain medications that treat or prevent blood clots, such as warfarin, apixaban, dabigatran, and rivaroxaban Cisplatin Cyclosporine Diuretics Medications for infection, such as acyclovir, adefovir, amphotericin B, bacitracin, cidofovir, foscarnet, ganciclovir, gentamicin, pentamidine, vancomycin NSAIDs, medications for pain and inflammation, such as ibuprofen or naproxen Other medications that cause heart rhythm changes Pamidronate Zoledronic acid This list may not describe all possible interactions. Give your health care provider a list of all the medicines, herbs, non-prescription drugs, or dietary supplements you use. Also tell them if you smoke, drink alcohol, or use illegal drugs. Some items may interact with your medicine. What should I watch for while using this medication? Your condition will be monitored carefully while you are receiving this medication. You may need blood work while taking this medication. This medication may make you feel generally unwell. This is not uncommon as chemotherapy can affect healthy cells as well as cancer cells.  Report any side effects. Continue your course of treatment even though you feel ill unless your care team tells you to stop. This medication may increase your risk of getting an infection. Call your care team for advice if you get a fever, chills, sore throat, or other symptoms of a cold or flu. Do not treat yourself. Try to avoid being around people who are sick. Avoid taking medications that contain aspirin, acetaminophen, ibuprofen, naproxen, or ketoprofen unless instructed by your care team. These medications may hide a fever. Be careful brushing or flossing your teeth or using a toothpick because you may get an infection or bleed more  easily. If you have any dental work done, tell your dentist you are receiving this medication. This medication can make you more sensitive to cold. Do not drink cold drinks or use ice. Cover exposed skin before coming in contact with cold temperatures or cold objects. When out in cold weather wear warm clothing and cover your mouth and nose to warm the air that goes into your lungs. Tell your care team if you get sensitive to the cold. Talk to your care team if you or your partner are pregnant or think either of you might be pregnant. This medication can cause serious birth defects if taken during pregnancy and for 9 months after the last dose. A negative pregnancy test is required before starting this medication. A reliable form of contraception is recommended while taking this medication and for 9 months after the last dose. Talk to your care team about effective forms of contraception. Do not father a child while taking this medication and for 6 months after the last dose. Use a condom while having sex during this time period. Do not breastfeed while taking this medication and for 3 months after the last dose. This medication may cause infertility. Talk to your care team if you are concerned about your fertility. What side effects may I notice from receiving this medication? Side effects that you should report to your care team as soon as possible: Allergic reactions--skin rash, itching, hives, swelling of the face, lips, tongue, or throat Bleeding--bloody or black, tar-like stools, vomiting blood or brown material that looks like coffee grounds, red or dark brown urine, small red or purple spots on skin, unusual bruising or bleeding Dry cough, shortness of breath or trouble breathing Heart rhythm changes--fast or irregular heartbeat, dizziness, feeling faint or lightheaded, chest pain, trouble breathing Infection--fever, chills, cough, sore throat, wounds that don't heal, pain or trouble when passing  urine, general feeling of discomfort or being unwell Liver injury--right upper belly pain, loss of appetite, nausea, light-colored stool, dark yellow or brown urine, yellowing skin or eyes, unusual weakness or fatigue Low red blood cell level--unusual weakness or fatigue, dizziness, headache, trouble breathing Muscle injury--unusual weakness or fatigue, muscle pain, dark yellow or brown urine, decrease in amount of urine Pain, tingling, or numbness in the hands or feet Sudden and severe headache, confusion, change in vision, seizures, which may be signs of posterior reversible encephalopathy syndrome (PRES) Unusual bruising or bleeding Side effects that usually do not require medical attention (report to your care team if they continue or are bothersome): Diarrhea Nausea Pain, redness, or swelling with sores inside the mouth or throat Unusual weakness or fatigue Vomiting This list may not describe all possible side effects. Call your doctor for medical advice about side effects. You may report side effects to FDA at 1-800-FDA-1088. Where should I keep my medication? This medication  is given in a hospital or clinic. It will not be stored at home. NOTE: This sheet is a summary. It may not cover all possible information. If you have questions about this medicine, talk to your doctor, pharmacist, or health care provider.  2024 Elsevier/Gold Standard (2023-04-30 00:00:00)

## 2023-07-06 ENCOUNTER — Ambulatory Visit: Payer: 59

## 2023-07-07 ENCOUNTER — Inpatient Hospital Stay: Payer: 59 | Admitting: Dietician

## 2023-07-07 ENCOUNTER — Inpatient Hospital Stay: Payer: 59

## 2023-07-07 ENCOUNTER — Ambulatory Visit: Payer: 59

## 2023-07-07 VITALS — BP 131/74 | HR 88 | Temp 97.7°F | Resp 16

## 2023-07-07 DIAGNOSIS — Z5111 Encounter for antineoplastic chemotherapy: Secondary | ICD-10-CM | POA: Diagnosis not present

## 2023-07-07 DIAGNOSIS — C189 Malignant neoplasm of colon, unspecified: Secondary | ICD-10-CM

## 2023-07-07 DIAGNOSIS — C7931 Secondary malignant neoplasm of brain: Secondary | ICD-10-CM | POA: Diagnosis not present

## 2023-07-07 DIAGNOSIS — Z5189 Encounter for other specified aftercare: Secondary | ICD-10-CM | POA: Diagnosis not present

## 2023-07-07 DIAGNOSIS — C7801 Secondary malignant neoplasm of right lung: Secondary | ICD-10-CM | POA: Diagnosis not present

## 2023-07-07 DIAGNOSIS — E860 Dehydration: Secondary | ICD-10-CM

## 2023-07-07 DIAGNOSIS — R110 Nausea: Secondary | ICD-10-CM

## 2023-07-07 DIAGNOSIS — G629 Polyneuropathy, unspecified: Secondary | ICD-10-CM | POA: Diagnosis not present

## 2023-07-07 DIAGNOSIS — C182 Malignant neoplasm of ascending colon: Secondary | ICD-10-CM

## 2023-07-07 DIAGNOSIS — C786 Secondary malignant neoplasm of retroperitoneum and peritoneum: Secondary | ICD-10-CM | POA: Diagnosis not present

## 2023-07-07 DIAGNOSIS — C7802 Secondary malignant neoplasm of left lung: Secondary | ICD-10-CM | POA: Diagnosis not present

## 2023-07-07 DIAGNOSIS — C7971 Secondary malignant neoplasm of right adrenal gland: Secondary | ICD-10-CM | POA: Diagnosis not present

## 2023-07-07 DIAGNOSIS — D696 Thrombocytopenia, unspecified: Secondary | ICD-10-CM | POA: Diagnosis not present

## 2023-07-07 MED ORDER — SODIUM CHLORIDE 0.9% FLUSH
3.0000 mL | Freq: Once | INTRAVENOUS | Status: AC | PRN
  Administered 2023-07-07: 3 mL

## 2023-07-07 MED ORDER — HEPARIN SOD (PORK) LOCK FLUSH 100 UNIT/ML IV SOLN: 500.0000 [IU] | Freq: Once | INTRAVENOUS | Status: DC | PRN

## 2023-07-07 MED ORDER — SODIUM CHLORIDE 0.9% IV SOLUTION
250.0000 mL | INTRAVENOUS | Status: DC
  Administered 2023-07-07: 250 mL via INTRAVENOUS

## 2023-07-07 MED ORDER — SODIUM CHLORIDE 0.9% FLUSH: 10.0000 mL | INTRAVENOUS | Status: DC | PRN

## 2023-07-07 MED ORDER — HEPARIN SOD (PORK) LOCK FLUSH 100 UNIT/ML IV SOLN
250.0000 [IU] | Freq: Once | INTRAVENOUS | Status: AC | PRN
  Administered 2023-07-07: 250 [IU]

## 2023-07-07 NOTE — Progress Notes (Signed)
 Nutrition Follow Up:   Reason for Assessment: MST screen for weight loss.   Reached out to patient at her home/cell phone.  She relayed she thinks the weight on 06/28/23 may have been entered wrong.  She relayed that she has never been in the 140s.  She is eating and holding weight but foods are unappealing and she asked for some ideas to help make meals more enjoyable. She is using Dark Chocolate Glucerna ONS as a meal replacement. She doesn't tolerate the Glucerna Protein smart as it feels to gritty.  Having some diarrhea but she tris not to use any meds as she can then become constipated.  Fruits are tasting good now. Eating breakfast each morning and grazing as day goes on. Foods that are working well: Cheerios, bananas, apples, pineapple, cottage cheese with peaches, pudding, eggs she waivers between liking and not, nuts and trail mix.  Anthropometrics: weight rebounded after 7# loss in 2 weeks  Height: 68 Weight: 154.7# UBW: 152-154# BMI: 23.52    NUTRITION DIAGNOSIS: Food and Nutrition Related Knowledge Deficit related to cancer and associated treatments effects as evidenced by  need for additional nutrition related information.   INTERVENTION:  Reviewed guidelines for improving appeal with use of fruits as condiments for other high protein options. Encouraged soluble fiber and consideration for use of probiotic supplement. Discussed strategies for managing diarrhea. Encouraged her to ask for samples of Banatrol today when she goes for her infusion. Emailed websites for [employer] and [employer], and recipe for high protein chocolate pudding with contact information provided   MONITORING, EVALUATION, GOAL: weight, PO intake, Nutrition Impact Symptoms, labs Goal is weight maintenance  Next Visit: PRN at patient or provider request  Micheline Craven, RDN, LDN Registered Dietitian,  [facility] Part Time Remote (Usual office hours: Tuesday-Thursday) Cell: [phone]

## 2023-07-07 NOTE — Patient Instructions (Signed)
Managing Low Blood Counts During Cancer Treatment Cancer treatments, such as chemotherapy and radiation, may cause a drop in the number of blood cells in the body. These blood cells include red blood cells, white blood cells, and platelets. They are made in the body and sent into the blood to do certain tasks. Red blood cells carry gases such as oxygen and carbon dioxide to and from your lungs. White blood cells help protect you from infection. Platelets help your body form blood clots to prevent and control bleeding. When there is a drop in blood cell counts, your body may not have enough cells to do what it needs to do. This can cause problems. If your blood counts are low, you can take steps to help manage problems. How can low blood counts affect me? The problems you may have from low blood counts will depend on which blood cells are affected. If you have a low number of red blood cells, you have a condition called anemia. This can cause you to feel tired, weak, light-headed, or short of breath. If you have a low number of white blood cells, you may be more at risk for infections. If you have a low number of platelets, you may bleed and bruise easily. Your body may also have trouble stopping any bleeding. How to manage symptoms or prevent problems from a low blood count If you have a low blood count, you can take steps to help decrease symptoms and prevent other problems. The steps you need to take will depend on which type of blood cell is low. Low red blood cells To help manage the symptoms of anemia: Go for a walk or do some light exercise each day. Take short naps during the day. Eat foods with a lot of iron and protein. These include leafy green vegetables, meat and fish, beans, sweet potatoes, and dried fruit. Ask for help with errands and with work that needs to be done around the house. Take vitamins or supplements only as told by your health care provider. Find ways to relax. These  may include yoga or meditation.  Low white blood cells To help prevent infections: Keep your body clean. Make sure you: Wash your hands often with warm water and soap. If you get a scrape or cut, clean it right away. Clean yourself well after you use the bathroom. Tell your provider if you have any rectal sores or bleeding. Avoid contact with pet waste. Wash your hands after handling pets. Do not swim or wade in lakes, ponds, rivers, water parks, or hot tubs. Stay away from crowds of people and any person who has the flu or a fever. To protect yourself, you may need to wear a mask when you are around other people. People should be fever-free for 24 hours before you see them. Avoid dental work that you do not need. Check your mouth each day for sores or signs of infection. Do not share utensils. Avoid fresh flowers and plants or dried flowers. Follow food safety guidelines. Cook meat well and wash all raw fruits and vegetables.  Low platelets To help prevent or control bleeding and bruising: Use an electric razor for shaving instead of a blade. Use a soft toothbrush. Be careful when you clean your mouth and teeth. Ask your cancer care team whether you should avoid flossing. If your mouth is bleeding, rinse it with ice water. Avoid activities that could cause injury, such as contact sports. Talk with your provider about using   stool softeners to avoid constipation and straining during bowel movements. Do not use medicines such as ibuprofen, aspirin, or naproxen unless your provider tells you to. Limit alcohol use. If you drink alcohol: Limit how much you have to: 0-1 drink a day if you are female. 0-2 drinks a day if you are female. Know how much alcohol is in your drink. In the [place] one drink equals one 12 oz bottle of beer (355 mL), one 5 oz glass of wine (148 mL), or one 1 oz glass of hard liquor (44 mL). Monitor any bleeding. If you start bleeding, hold pressure on the area for 5 minutes  to stop the bleeding. Bleeding that does not stop is an emergency. What treatments can help increase a low blood count? Treatment that can help increase a low blood count will depend on the type of blood cell that is low and how severe your condition is. If told by your provider, you may need to: Take medicines to help promote the growth of white or red blood cells. You may also need to take iron, folic acid, or vitamin B12 supplements. Make changes to your diet. You may need more iron and protein. This can help your body make more red blood cells. Adjust your medicines or treatment plan to help raise blood counts. Have a blood transfusion. This may be done if your blood count is very low. Where to find more information American [employer] Society: cancer.org National Cancer Institute: cancer.gov Contact a health care provider if: You feel very tired and weak. You have more bruising or bleeding. You feel ill or get a cough. It hurts when you urinate, or you have blood in your urine or stool. You have pain in your abdomen, or you have diarrhea. You plan to take any new supplements or vitamins or make changes to your diet. You have any signs of an infection such as: Mouth sores or a sore throat. Swelling or redness. Fever or chills. A temperature of 100.4F (38C) or higher when you have low white blood cells. Get help right away if: You are short of breath or feel dizzy. You have bleeding that will not stop. This information is not intended to replace advice given to you by your health care provider. Make sure you discuss any questions you have with your health care provider. Document Revised: 12/10/2021 Document Reviewed: 12/10/2021 Elsevier Patient Education  2024 [employer]  

## 2023-07-08 LAB — PREPARE PLATELET PHERESIS: Unit division: 0

## 2023-07-08 LAB — BPAM PLATELET PHERESIS
Blood Product Expiration Date: 202502062359
ISSUE DATE / TIME: 202502051358
Unit Type and Rh: 202502062359
Unit Type and Rh: 5100

## 2023-07-12 ENCOUNTER — Inpatient Hospital Stay: Payer: 59

## 2023-07-12 VITALS — BP 112/80 | HR 92 | Temp 98.2°F | Resp 18

## 2023-07-12 DIAGNOSIS — Z5189 Encounter for other specified aftercare: Secondary | ICD-10-CM | POA: Diagnosis not present

## 2023-07-12 DIAGNOSIS — C786 Secondary malignant neoplasm of retroperitoneum and peritoneum: Secondary | ICD-10-CM | POA: Diagnosis not present

## 2023-07-12 DIAGNOSIS — Z5111 Encounter for antineoplastic chemotherapy: Secondary | ICD-10-CM | POA: Diagnosis not present

## 2023-07-12 DIAGNOSIS — C7931 Secondary malignant neoplasm of brain: Secondary | ICD-10-CM | POA: Diagnosis not present

## 2023-07-12 DIAGNOSIS — C189 Malignant neoplasm of colon, unspecified: Secondary | ICD-10-CM | POA: Diagnosis not present

## 2023-07-12 DIAGNOSIS — D696 Thrombocytopenia, unspecified: Secondary | ICD-10-CM | POA: Diagnosis not present

## 2023-07-12 DIAGNOSIS — C7802 Secondary malignant neoplasm of left lung: Secondary | ICD-10-CM | POA: Diagnosis not present

## 2023-07-12 DIAGNOSIS — G629 Polyneuropathy, unspecified: Secondary | ICD-10-CM | POA: Diagnosis not present

## 2023-07-12 DIAGNOSIS — C7971 Secondary malignant neoplasm of right adrenal gland: Secondary | ICD-10-CM | POA: Diagnosis not present

## 2023-07-12 DIAGNOSIS — C182 Malignant neoplasm of ascending colon: Secondary | ICD-10-CM

## 2023-07-12 DIAGNOSIS — C7800 Secondary malignant neoplasm of unspecified lung: Secondary | ICD-10-CM

## 2023-07-12 DIAGNOSIS — C7801 Secondary malignant neoplasm of right lung: Secondary | ICD-10-CM | POA: Diagnosis not present

## 2023-07-12 MED ORDER — FILGRASTIM-SNDZ 480 MCG/0.8ML IJ SOSY
480.0000 ug | PREFILLED_SYRINGE | Freq: Once | INTRAMUSCULAR | Status: AC
Start: 2023-07-12 — End: 2023-07-12
  Administered 2023-07-12: 480 ug via SUBCUTANEOUS
  Filled 2023-07-12: qty 0.8

## 2023-07-12 NOTE — Patient Instructions (Signed)
 Filgrastim Injection What is this medication? FILGRASTIM (fil GRA stim) lowers the risk of infection in people who are receiving chemotherapy. It works by Systems analyst make more white blood cells, which protects your body from infection. It may also be used to help people who have been exposed to high doses of radiation. It can be used to help prepare your body before a stem cell transplant. It works by helping your bone marrow make and release stem cells into the blood. This medicine may be used for other purposes; ask your health care provider or pharmacist if you have questions. COMMON BRAND NAME(S): Neupogen, Nivestym, Nypozi, Releuko, Zarxio What should I tell my care team before I take this medication? They need to know if you have any of these conditions: History of blood diseases, such as sickle cell anemia Kidney disease Recent or ongoing radiation An unusual or allergic reaction to filgrastim, pegfilgrastim, latex, rubber, other medications, foods, dyes, or preservatives Pregnant or trying to get pregnant Breast-feeding How should I use this medication? This medication is injected under the skin or into a vein. It is usually given by your care team in a hospital or clinic setting. It may be given at home. If you get this medication at home, you will be taught how to prepare and give it. Use exactly as directed. Take it as directed on the prescription label at the same time every day. Keep taking it unless your care team tells you to stop. It is important that you put your used needles and syringes in a special sharps container. Do not put them in a trash can. If you do not have a sharps container, call your pharmacist or care team to get one. This medication comes with INSTRUCTIONS FOR USE. Ask your pharmacist for directions on how to use this medication. Read the information carefully. Talk to your pharmacist or care team if you have questions. Talk to your care team about the use of  this medication in children. While it may be prescribed for children for selected conditions, precautions do apply. Overdosage: If you think you have taken too much of this medicine contact a poison control center or emergency room at once. NOTE: This medicine is only for you. Do not share this medicine with others. What if I miss a dose? It is important not to miss any doses. Talk to your care team about what to do if you miss a dose. What may interact with this medication? Medications that may cause a release of neutrophils, such as lithium This list may not describe all possible interactions. Give your health care provider a list of all the medicines, herbs, non-prescription drugs, or dietary supplements you use. Also tell them if you smoke, drink alcohol, or use illegal drugs. Some items may interact with your medicine. What should I watch for while using this medication? Your condition will be monitored carefully while you are receiving this medication. You may need bloodwork while taking this medication. Talk to your care team about your risk of cancer. You may be more at risk for certain types of cancer if you take this medication. What side effects may I notice from receiving this medication? Side effects that you should report to your care team as soon as possible: Allergic reactions--skin rash, itching, hives, swelling of the face, lips, tongue, or throat Capillary leak syndrome--stomach or muscle pain, unusual weakness or fatigue, feeling faint or lightheaded, decrease in the amount of urine, swelling of the ankles, hands,  or feet, trouble breathing High white blood cell level--fever, fatigue, trouble breathing, night sweats, change in vision, weight loss Inflammation of the aorta--fever, fatigue, back, chest, or stomach pain, severe headache Kidney injury (glomerulonephritis)--decrease in the amount of urine, red or dark brown urine, foamy or bubbly urine, swelling of the ankles, hands,  or feet Shortness of breath or trouble breathing Spleen injury--pain in upper left stomach or shoulder Unusual bruising or bleeding Side effects that usually do not require medical attention (report to your care team if they continue or are bothersome): Back pain Bone pain Fatigue Fever Headache Nausea This list may not describe all possible side effects. Call your doctor for medical advice about side effects. You may report side effects to FDA at 1-800-FDA-1088. Where should I keep my medication? Keep out of the reach of children and pets. Keep this medication in the original packaging until you are ready to take it. Protect from light. See product for storage information. Each product may have different instructions. Get rid of any unused medication after the expiration date. To get rid of medications that are no longer needed or have expired: Take the medication to a medications take-back program. Check with your pharmacy or law enforcement to find a location. If you cannot return the medication, ask your pharmacist or care team how to get rid of this medication safely. NOTE: This sheet is a summary. It may not cover all possible information. If you have questions about this medicine, talk to your doctor, pharmacist, or health care provider.  2024 Elsevier/[employer] Standard (2021-10-09 00:00:00)

## 2023-07-13 ENCOUNTER — Inpatient Hospital Stay: Payer: 59

## 2023-07-13 VITALS — BP 108/72 | HR 98 | Temp 97.7°F | Resp 18 | Ht 68.0 in | Wt 153.0 lb

## 2023-07-13 DIAGNOSIS — C786 Secondary malignant neoplasm of retroperitoneum and peritoneum: Secondary | ICD-10-CM | POA: Diagnosis not present

## 2023-07-13 DIAGNOSIS — C7931 Secondary malignant neoplasm of brain: Secondary | ICD-10-CM | POA: Diagnosis not present

## 2023-07-13 DIAGNOSIS — C7801 Secondary malignant neoplasm of right lung: Secondary | ICD-10-CM | POA: Diagnosis not present

## 2023-07-13 DIAGNOSIS — Z5111 Encounter for antineoplastic chemotherapy: Secondary | ICD-10-CM | POA: Diagnosis not present

## 2023-07-13 DIAGNOSIS — C182 Malignant neoplasm of ascending colon: Secondary | ICD-10-CM

## 2023-07-13 DIAGNOSIS — Z5189 Encounter for other specified aftercare: Secondary | ICD-10-CM | POA: Diagnosis not present

## 2023-07-13 DIAGNOSIS — C189 Malignant neoplasm of colon, unspecified: Secondary | ICD-10-CM

## 2023-07-13 DIAGNOSIS — D696 Thrombocytopenia, unspecified: Secondary | ICD-10-CM | POA: Diagnosis not present

## 2023-07-13 DIAGNOSIS — G629 Polyneuropathy, unspecified: Secondary | ICD-10-CM | POA: Diagnosis not present

## 2023-07-13 DIAGNOSIS — C7971 Secondary malignant neoplasm of right adrenal gland: Secondary | ICD-10-CM | POA: Diagnosis not present

## 2023-07-13 DIAGNOSIS — C7802 Secondary malignant neoplasm of left lung: Secondary | ICD-10-CM | POA: Diagnosis not present

## 2023-07-13 MED ORDER — FILGRASTIM-SNDZ 480 MCG/0.8ML IJ SOSY
480.0000 ug | PREFILLED_SYRINGE | Freq: Once | INTRAMUSCULAR | Status: AC
  Administered 2023-07-13: 480 ug via SUBCUTANEOUS
  Filled 2023-07-13: qty 0.8

## 2023-07-13 NOTE — Patient Instructions (Signed)
Filgrastim Injection What is this medication? FILGRASTIM (fil GRA stim) lowers the risk of infection in people who are receiving chemotherapy. It works by Systems analyst make more white blood cells, which protects your body from infection. It may also be used to help people who have been exposed to high doses of radiation. It can be used to help prepare your body before a stem cell transplant. It works by helping your bone marrow make and release stem cells into the blood. This medicine may be used for other purposes; ask your health care provider or pharmacist if you have questions. COMMON BRAND NAME(S): Neupogen, Nivestym, Nypozi, Releuko, Zarxio What should I tell my care team before I take this medication? They need to know if you have any of these conditions: History of blood diseases, such as sickle cell anemia Kidney disease Recent or ongoing radiation An unusual or allergic reaction to filgrastim, pegfilgrastim, latex, rubber, other medications, foods, dyes, or preservatives Pregnant or trying to get pregnant Breast-feeding How should I use this medication? This medication is injected under the skin or into a vein. It is usually given by your care team in a hospital or clinic setting. It may be given at home. If you get this medication at home, you will be taught how to prepare and give it. Use exactly as directed. Take it as directed on the prescription label at the same time every day. Keep taking it unless your care team tells you to stop. It is important that you put your used needles and syringes in a special sharps container. Do not put them in a trash can. If you do not have a sharps container, call your pharmacist or care team to get one. This medication comes with INSTRUCTIONS FOR USE. Ask your pharmacist for directions on how to use this medication. Read the information carefully. Talk to your pharmacist or care team if you have questions. Talk to your care team about the use of  this medication in children. While it may be prescribed for children for selected conditions, precautions do apply. Overdosage: If you think you have taken too much of this medicine contact a poison control center or emergency room at once. NOTE: This medicine is only for you. Do not share this medicine with others. What if I miss a dose? It is important not to miss any doses. Talk to your care team about what to do if you miss a dose. What may interact with this medication? Medications that may cause a release of neutrophils, such as lithium This list may not describe all possible interactions. Give your health care provider a list of all the medicines, herbs, non-prescription drugs, or dietary supplements you use. Also tell them if you smoke, drink alcohol, or use illegal drugs. Some items may interact with your medicine. What should I watch for while using this medication? Your condition will be monitored carefully while you are receiving this medication. You may need bloodwork while taking this medication. Talk to your care team about your risk of cancer. You may be more at risk for certain types of cancer if you take this medication. What side effects may I notice from receiving this medication? Side effects that you should report to your care team as soon as possible: Allergic reactions--skin rash, itching, hives, swelling of the face, lips, tongue, or throat Capillary leak syndrome--stomach or muscle pain, unusual weakness or fatigue, feeling faint or lightheaded, decrease in the amount of urine, swelling of the ankles, hands,  or feet, trouble breathing High white blood cell level--fever, fatigue, trouble breathing, night sweats, change in vision, weight loss Inflammation of the aorta--fever, fatigue, back, chest, or stomach pain, severe headache Kidney injury (glomerulonephritis)--decrease in the amount of urine, red or dark brown urine, foamy or bubbly urine, swelling of the ankles, hands,  or feet Shortness of breath or trouble breathing Spleen injury--pain in upper left stomach or shoulder Unusual bruising or bleeding Side effects that usually do not require medical attention (report to your care team if they continue or are bothersome): Back pain Bone pain Fatigue Fever Headache Nausea This list may not describe all possible side effects. Call your doctor for medical advice about side effects. You may report side effects to FDA at 1-800-FDA-1088. Where should I keep my medication? Keep out of the reach of children and pets. Keep this medication in the original packaging until you are ready to take it. Protect from light. See product for storage information. Each product may have different instructions. Get rid of any unused medication after the expiration date. To get rid of medications that are no longer needed or have expired: Take the medication to a medications take-back program. Check with your pharmacy or law enforcement to find a location. If you cannot return the medication, ask your pharmacist or care team how to get rid of this medication safely. NOTE: This sheet is a summary. It may not cover all possible information. If you have questions about this medicine, talk to your doctor, pharmacist, or health care provider.  2024 Elsevier/Gold Standard (2021-10-09 00:00:00)

## 2023-07-14 ENCOUNTER — Inpatient Hospital Stay: Payer: 59

## 2023-07-14 VITALS — BP 93/65 | HR 98 | Temp 98.0°F | Resp 18

## 2023-07-14 DIAGNOSIS — C182 Malignant neoplasm of ascending colon: Secondary | ICD-10-CM

## 2023-07-14 DIAGNOSIS — D696 Thrombocytopenia, unspecified: Secondary | ICD-10-CM | POA: Diagnosis not present

## 2023-07-14 DIAGNOSIS — C7800 Secondary malignant neoplasm of unspecified lung: Secondary | ICD-10-CM

## 2023-07-14 DIAGNOSIS — C7802 Secondary malignant neoplasm of left lung: Secondary | ICD-10-CM | POA: Diagnosis not present

## 2023-07-14 DIAGNOSIS — C189 Malignant neoplasm of colon, unspecified: Secondary | ICD-10-CM | POA: Diagnosis not present

## 2023-07-14 DIAGNOSIS — C7801 Secondary malignant neoplasm of right lung: Secondary | ICD-10-CM | POA: Diagnosis not present

## 2023-07-14 DIAGNOSIS — Z5111 Encounter for antineoplastic chemotherapy: Secondary | ICD-10-CM | POA: Diagnosis not present

## 2023-07-14 DIAGNOSIS — C7971 Secondary malignant neoplasm of right adrenal gland: Secondary | ICD-10-CM | POA: Diagnosis not present

## 2023-07-14 DIAGNOSIS — C786 Secondary malignant neoplasm of retroperitoneum and peritoneum: Secondary | ICD-10-CM | POA: Diagnosis not present

## 2023-07-14 DIAGNOSIS — G629 Polyneuropathy, unspecified: Secondary | ICD-10-CM | POA: Diagnosis not present

## 2023-07-14 DIAGNOSIS — C7931 Secondary malignant neoplasm of brain: Secondary | ICD-10-CM | POA: Diagnosis not present

## 2023-07-14 DIAGNOSIS — Z5189 Encounter for other specified aftercare: Secondary | ICD-10-CM | POA: Diagnosis not present

## 2023-07-14 MED ORDER — FILGRASTIM-SNDZ 480 MCG/0.8ML IJ SOSY
480.0000 ug | PREFILLED_SYRINGE | Freq: Once | INTRAMUSCULAR | Status: AC
  Administered 2023-07-14: 480 ug via SUBCUTANEOUS
  Filled 2023-07-14: qty 0.8

## 2023-07-14 NOTE — Patient Instructions (Signed)
Filgrastim Injection What is this medication? FILGRASTIM (fil GRA stim) lowers the risk of infection in people who are receiving chemotherapy. It works by Systems analyst make more white blood cells, which protects your body from infection. It may also be used to help people who have been exposed to high doses of radiation. It can be used to help prepare your body before a stem cell transplant. It works by helping your bone marrow make and release stem cells into the blood. This medicine may be used for other purposes; ask your health care provider or pharmacist if you have questions. COMMON BRAND NAME(S): Neupogen, Nivestym, Nypozi, Releuko, Zarxio What should I tell my care team before I take this medication? They need to know if you have any of these conditions: History of blood diseases, such as sickle cell anemia Kidney disease Recent or ongoing radiation An unusual or allergic reaction to filgrastim, pegfilgrastim, latex, rubber, other medications, foods, dyes, or preservatives Pregnant or trying to get pregnant Breast-feeding How should I use this medication? This medication is injected under the skin or into a vein. It is usually given by your care team in a hospital or clinic setting. It may be given at home. If you get this medication at home, you will be taught how to prepare and give it. Use exactly as directed. Take it as directed on the prescription label at the same time every day. Keep taking it unless your care team tells you to stop. It is important that you put your used needles and syringes in a special sharps container. Do not put them in a trash can. If you do not have a sharps container, call your pharmacist or care team to get one. This medication comes with INSTRUCTIONS FOR USE. Ask your pharmacist for directions on how to use this medication. Read the information carefully. Talk to your pharmacist or care team if you have questions. Talk to your care team about the use of  this medication in children. While it may be prescribed for children for selected conditions, precautions do apply. Overdosage: If you think you have taken too much of this medicine contact a poison control center or emergency room at once. NOTE: This medicine is only for you. Do not share this medicine with others. What if I miss a dose? It is important not to miss any doses. Talk to your care team about what to do if you miss a dose. What may interact with this medication? Medications that may cause a release of neutrophils, such as lithium This list may not describe all possible interactions. Give your health care provider a list of all the medicines, herbs, non-prescription drugs, or dietary supplements you use. Also tell them if you smoke, drink alcohol, or use illegal drugs. Some items may interact with your medicine. What should I watch for while using this medication? Your condition will be monitored carefully while you are receiving this medication. You may need bloodwork while taking this medication. Talk to your care team about your risk of cancer. You may be more at risk for certain types of cancer if you take this medication. What side effects may I notice from receiving this medication? Side effects that you should report to your care team as soon as possible: Allergic reactions--skin rash, itching, hives, swelling of the face, lips, tongue, or throat Capillary leak syndrome--stomach or muscle pain, unusual weakness or fatigue, feeling faint or lightheaded, decrease in the amount of urine, swelling of the ankles, hands,  or feet, trouble breathing High white blood cell level--fever, fatigue, trouble breathing, night sweats, change in vision, weight loss Inflammation of the aorta--fever, fatigue, back, chest, or stomach pain, severe headache Kidney injury (glomerulonephritis)--decrease in the amount of urine, red or dark brown urine, foamy or bubbly urine, swelling of the ankles, hands,  or feet Shortness of breath or trouble breathing Spleen injury--pain in upper left stomach or shoulder Unusual bruising or bleeding Side effects that usually do not require medical attention (report to your care team if they continue or are bothersome): Back pain Bone pain Fatigue Fever Headache Nausea This list may not describe all possible side effects. Call your doctor for medical advice about side effects. You may report side effects to FDA at 1-800-FDA-1088. Where should I keep my medication? Keep out of the reach of children and pets. Keep this medication in the original packaging until you are ready to take it. Protect from light. See product for storage information. Each product may have different instructions. Get rid of any unused medication after the expiration date. To get rid of medications that are no longer needed or have expired: Take the medication to a medications take-back program. Check with your pharmacy or law enforcement to find a location. If you cannot return the medication, ask your pharmacist or care team how to get rid of this medication safely. NOTE: This sheet is a summary. It may not cover all possible information. If you have questions about this medicine, talk to your doctor, pharmacist, or health care provider.  2024 Elsevier/Gold Standard (2021-10-09 00:00:00)

## 2023-07-22 ENCOUNTER — Encounter: Payer: Self-pay | Admitting: Psychiatry

## 2023-07-22 ENCOUNTER — Ambulatory Visit (INDEPENDENT_AMBULATORY_CARE_PROVIDER_SITE_OTHER): Payer: 59 | Admitting: Psychiatry

## 2023-07-22 DIAGNOSIS — F333 Major depressive disorder, recurrent, severe with psychotic symptoms: Secondary | ICD-10-CM | POA: Diagnosis not present

## 2023-07-22 DIAGNOSIS — F422 Mixed obsessional thoughts and acts: Secondary | ICD-10-CM

## 2023-07-22 DIAGNOSIS — G4721 Circadian rhythm sleep disorder, delayed sleep phase type: Secondary | ICD-10-CM | POA: Diagnosis not present

## 2023-07-22 DIAGNOSIS — R530 Neoplastic (malignant) related fatigue: Secondary | ICD-10-CM

## 2023-07-22 DIAGNOSIS — G3184 Mild cognitive impairment, so stated: Secondary | ICD-10-CM

## 2023-07-22 LAB — LAB REPORT - SCANNED: EGFR: 59

## 2023-07-22 NOTE — Progress Notes (Signed)
Leatrice Parilla [id number] 15-Aug-1965 58 y.o.    Subjective:   Patient ID:  Shannon [name] is a 59 y.o. (DOB 24-Feb-1966) female.  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  seen November 2020 .  No meds were changed at that time.  10/16/19 the following is noted at this appt: Asked questions about sleep supplement.   Just started GI doctor.   Good overall. No mood swings. No depressive episodes.  Cry more with hormonal changes more easily.   Probably sleep too much to avoid.  Occ stress from work interferes with sleep.  Not as productive from home.  Hopes to get back to the office.  Still working from home.  Boss noticed her forgetfulness with deadlines and missing issues in emails.  Used to be sharper. Wonders if related to meds.  Can usually nap if desired for years and not seasonal.  Only anxiety is Covid related. More poor self care.   Hysterectomy December 2020. No med changes  03/18/2020 appointment with the following noted: July had Covid and both parents and her father died from [place]. Able to help mom with arrangements. Required one iron infusion this year. Never took NAC bc it was too hard to take. Sleep, eating schedule is irregular. More productive at home helps her feel better. Plan: No med changes. 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 For a month had dizzy, falls, NV all of which cleared quickly after removal.  Radiation to brain after this. More CA in lungs and abdomen and having chemo.  Oncologist said she has about 5 years of lifespan left.  MRI and CT pending. Gotten into therapy  starting Jonny Ruiz Rodenbaugh next week.   Pray a lot and good connection with friends.  Good support.  Trying to do things for fun.   Is working but finding it difficult mentally and physically DT SE fatigue from chemo. Considering LT disability but $ concerns.   If anxious trouble falling asleep.   Works from home.   D 58 yo.   Plan: No med changes except added modafinil  11/26/2020 appointment with the following noted: Added modafinil and didn't notice much from it. Sleep average about 7 hours.  Would prefer 10 hours and may nap here and there in week of chemo even on chemo.  Had to get it at [facility] with [facility]. No dx of OSA. Last 2 weeks a little low emotionally and mentally after up for a couple of weeks with company and it wore her out. Sleepiness is normally worse than tiredness. Only situational mood effects. Plan: No benefit 200 mg modafinil nor SE therefore Increase Modafinil to 300 mg daily for excessive sleepiness  04/21/2021 appointment with the following noted: Still dealing with chemotx for colon cancer Thinks increase modafinil questionable but misses it more than takes it. No SE. Things are getting harder rough month with more depression and anxiety related to cancer and daughter.  Work is hard.  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  Missing deadlines.   Needs to call social security. D 58 yo and in therapy  with Elio Forget.  So angry with patient. Has been able to stay on branded Seroquel. Tumors on CT scan  are shrinking recently. Plan: HX RELAPSE PSYCHOSIS WITH GENERIC QUETIAPINE, Continue branded Seroquel 800 mg HS and lamotrigine 150 mg daily and Luvox 300 mg daily Vraylar 1.5 mg daily for 2 weeks, Then if tolerated increase to 3 mg daily. If mood improves then reduce Seroquel to 1 and 1/2 tablets.  06/06/2021 appt noted: I like it.  More clear headed and better energy.  Missed a few bc out of samples. Reduced Seroquel 600 mg HS Less  depressed.  Especially right after starting and others noticed the benefit. Plan : Continue lamotrigine 150 mg daily and Luvox 300 mg daily Vraylar 3 mg daily. Reduce Seroquel to 400 mg nightly for 2 weeks,  Then reduce Seroquel to 200 mg nightly for 2 weeks,  Then reduce to 100 mg for 2 weeks, Then reduce to 50 mg 2 weeks then stop if you can still sleep ok  08/07/2021 appointment with the following noted: Continues Vraylar 3 mg and Luvox 300 mg daily.  Reduced Seroquel to 200 mg nightly and takes Ambien 5 mg nightly Went slowly down with Seroquel. Doing pretty good.  Some night hard to go to sleep but usally ok.   Still please with Vraylar less sedated. No mood swings.  Some struggle with depression with reduced motivation and enjoyment.  Sometimes bored and would rather go to bed.  Don't want to read or watch TV.   Can follow a show but doesn't find it intersting.  CT scan show tumors shrinking. Plan: Continue lamotrigine 150 mg daily and Luvox 300 mg daily Reduce Vraylar 3 mg every other day to see If depression energy and interest and enjoyment is better Reduce Seroquel to 150 for 2 weeks Then reduce to 100 mg for 2 weeks, Then reduce to 50 mg 2 weeks then stop if you can still sleep ok Wants to have access to Ambien Plan: No benefit 200 mg modafinil nor SE therefore OK continue trial  Modafinil to 300 mg daily for excessive sleepiness  10/30/2021 appointment with the following noted: Reduced quetiapine to 100 mg and trouble sleeping so back to 150 mg HS Fine with reduced Vraylar to 3 mg QOD Increase modafinil to 300 mg and is more alert and more active. No SE Overall mood is ok but still likes to resto often but not sleeping as much.  Not sure if it is chemo related.  Will rest after activity. At night sleeps 10 hours. Still hard to enjoy things and part of reason will lay down bc is bored.   Changed TV cable and not watching much now.  Can't concentrate to read well.  Enjoys dog.   Enjoying some projects at home.  Patient denies difficulty with sleep maintenance.   Denies appetite disturbance.  Patient reports that motivation have been good.  Patient denies any suicidal ideation.  Still too monotone. This is last week at work.  Not doing well at work DT concentration.  Will apply for disability. Ativan only every few weeks. Plan: Continue lamotrigine 150 mg daily  Continue Vraylar 3 mg every other day  Try again to Reduce Seroquel to 100 mg for 2 weeks, Then reduce to 50 mg 2 weeks then stop if you can still sleep ok Wants to have access to Ambien Reduce Luvox to 2 and 1/2 tablets Start Wellbutrin in the AM 1 for 1 week then 2 or 300  mg AM  12/22/21 appt noted: Reduced quetiapine to 50 and was OK but when stopped couldn't sleep.  Less daytime drowsy with less.   Reduced fluvoxamine to 250 and started Wellbutrin XL 300 mg AM. More even keel level and less down.  More content.  More steady.  Less teary and emotional. No SE problems with change. ? Modafinil in hopes of better concentration and clarity but ? Effect. Has some trouble with conc but it varies. No increase anxiety or jitteriness. Plan: Continue lamotrigine 150 mg daily  Continue Vraylar 3 mg every other day  Try again to Reduce Seroquel to 25-50 mg HS Wants to have access to Ambien Continue Luvox to 2 and 1/2 tablets Increase Wellbutrin to 450 mg AM residual depression anhedonia  03/11/2022 appointment noted Increase Wellbutrin to 450 mg AM residual depression anhedonia is partially better.  Not as heavy and cloudy.  Energy a little better until last 2 chemos wiped her out more with nausea for 6 days.  Chemo every 3 weeks. Enjoys her dog and helps her get up.   No SE with Wellbutrin.   Minimal anxiety usually.  Except a couple of days per week.  Anxiety over bills.   No sig obsessions.   Problems with daughter are stressful.  D drinks too much and sleeps all day and has social problems and  irresponsibility. D Not working.  She is 56 yo and very needy and childlike emotionally. Reduced Seroquel to 25 mg HS and willing to try to stop.  Avg 10 hour and some naps. But no longer hangover without much Seroquel. Has taken more Ambien. Plan: HX RELAPSE PSYCHOSIS WITH GENERIC QUETIAPINE, Continue lamotrigine 150 mg daily  Continue Vraylar 3 mg every other day  Try again to Reduce Seroquel if possible to 0 or  25 mg HS Wants to have access to Ambien Continue Luvox to 2 and 1/2 tablets Continue  Wellbutrin to 450 mg AM residual depression anhedonia OK continue Modafinil to 300 mg daily for excessive sleepiness and cancer related fatigue  10/15/22 appt noted: Meds as above and off Seroquel.  Rare Ambien.    No sig SE  Lately sleeping ok.  Mouth guard helped clenching of mouth.  Occ night of barely sleeping but better with mouth guard. Mood is ok . Anxiety high DT stressors.  Talking with friends family helps with stress.   Primary stressors CA and D 58 yo.   Disc sleep.   No sig obsessions.  No panic she can't handle.   Plan: Asks about trying to reduce some of the meds.  Disc options of fluvoxamine vs lamotrigine. Reduce lamotrigine to 1 of the 100 mg tablets for 1 month then reduce to 1/2 of the 100 mg for 2 weeks and if you feel ok stop it. Continue Vraylar 3 mg every other day  Off Seroquel Wants to have access to Ambien Continue Luvox to 2 and 1/2 tablets Continue  Wellbutrin to 450 mg AM residual depression anhedonia OK continue Modafinil to 300 mg daily for excessive sleepiness and cancer related fatigue  01/19/23 appt noted: Couldn't tell a difference off the lamotrigine. Meds: fluvox 250, lorazepam 0.5 rare, modafinil 300, Vraylar 3 mg every other day, Wellbutrin XL 450 AM, off lamotrigine. No Ambien Pretty good, stable, even keel.  Mild dep sx chronically.   No sig panic or anxity. Sleep variable.  Trouble staying asleep.  Naps an hour or more twice daily. 5-6 hours sleep  at night.  Still  feels drowsy in the day and even driving.   No SE Plan: Continue Vraylar 3 mg every other day  Wants to have access to Ambien but doesn't keep her asleep.  Continue Luvox to 2 and 1/2 tablets Continue  Wellbutrin to 450 mg AM residual depression anhedonia OK continue Modafinil to 300 mg daily for excessive sleepiness and cancer related fatigue Disc EFA, trial trazodone 50-100 mg HS  05/20/23 appt noted: Meds as above: trazodone 50 HS, others as noted. Still some nights of not sleeping even with trazodone every 2-3 weeks. Overall pretty good but still some dep and anxiety over the cancer and life.  Cancer treatment effectiveness is going well. Needs to do better to find things that engage her mind and the dep is holding her back in that area. D is still a big stress and is dependent onher.  Plan: DC Wellbutrin and trial Auvelity 1 Am and then BID for residual depression anhedonia  07/22/23 appt noted: Meds:  fluvox 250, lorazepam 0.5 rare, modafinil 300, Vraylar 3 mg every other day, Wellbutrin XL 450 AM, off lamotrigine. No Ambien. Trazodone 100 mg HS Never took Auvelity bc cost  $1000.   I feel a little better lately, lighter.  Still a lot of anxiety about things.  Dep a little easier.  Not normal interest but better.  Is willing to try Auvelity if cost can be avoided.  Hard to read DT inattention.  Some social activity.   Physically feels beaten down and tired.  Stumble a lot. Poor appetite and can't eat much.   Takes awhile to fall asleep.  11-4 then awakens and back to sleep until 12. No other sig changes. Extended chemo sessions bc good CT scans.  Pending scan from today.  No difference in SE with them spread out.     Past Psychiatric Medication Trials: HX RELAPSE PSYCHOSIS WITH GENERIC QUETIAPINE,  Seroquel 800, Risperdal 2, inVega 3, Abilify 15, Vraylar 3 Citalopram, fluoxetine 80, sertraline to 50, fluvoxamine 300 mg daily lamotrigine 200,  Modafinil  300 Ambien. No Lunesta, trazodone Has been under the care of the psychiatrist since March 2000  D seeing Elio Forget  Review of Systems:  Review of Systems  Constitutional:  Positive for fatigue.  HENT:  Negative for rhinorrhea.   Eyes:  Negative for discharge.  Cardiovascular:  Negative for palpitations.  Gastrointestinal:  Positive for constipation. Negative for abdominal distention.  Neurological:  Positive for weakness. Negative for tremors.       No history of SZ  Psychiatric/Behavioral:  Positive for decreased concentration and dysphoric mood. The patient is nervous/anxious.     Medications: I have reviewed the patient's current medications.  Current Outpatient Medications  Medication Sig Dispense Refill   acetaminophen (TYLENOL) 325 MG tablet Take 2 tablets (650 mg total) by mouth every 6 (six) hours as needed for mild pain.     atorvastatin (LIPITOR) 10 MG tablet Take by mouth.     cariprazine (VRAYLAR) 3 MG capsule Take 1 capsule (3 mg total) by mouth daily. (Patient taking differently: Take 3 mg by mouth daily. 1 every other day) 90 capsule 0   CONTOUR NEXT TEST test strip 3 (three) times daily.     Dulaglutide (TRULICITY) 3 MG/0.5ML SOAJ Inject 3 mg into the skin once a week. 6 mL 1   esomeprazole (NEXIUM) 40 MG capsule Take 40 mg by mouth daily at 12 noon.     fluvoxaMINE (LUVOX) 100 MG tablet TAKE 3 TABLETS (300  MG TOTAL) BY MOUTH AT BEDTIME (Patient taking differently: Take 250 mg by mouth at bedtime.) 90 tablet 1   hyoscyamine (LEVSIN SL) 0.125 MG SL tablet Place 1 tablet (0.125 mg total) under the tongue every 6 (six) hours as needed. 45 tablet 1   insulin aspart (NOVOLOG FLEXPEN) 100 UNIT/ML FlexPen Inject 8 Units into the skin 3 (three) times daily with meals. 15 mL 11   insulin glargine (LANTUS) 100 UNIT/ML Solostar Pen Inject 26 Units into the skin daily. 15 mL 11   levothyroxine (SYNTHROID) 75 MCG tablet Take 1 tablet (75 mcg total) by mouth daily. 30 tablet 0    loratadine (CLARITIN) 10 MG tablet Take 10 mg by mouth daily.     LORazepam (ATIVAN) 0.5 MG tablet Take 1 tablet (0.5 mg total) by mouth every 8 (eight) hours as needed for anxiety. 30 tablet 3   magic mouthwash SOLN Take 10 mLs by mouth 3 (three) times daily as needed for mouth pain.     modafinil (PROVIGIL) 200 MG tablet **DNF 5/30** TAKE 1 & 1/2 (ONE & ONE-HALF) TABLETS BY MOUTH ONCE DAILY 135 tablet 0   Multiple Vitamin (MULTI-VITAMIN) tablet Take 1 tablet by mouth daily.     NOVOFINE PEN NEEDLE 32G X 6 MM MISC SMARTSIG:Syringe(s) SUB-Q     ondansetron (ZOFRAN) 4 MG tablet Take 1 tablet (4 mg total) by mouth every 4 (four) hours as needed for nausea. 90 tablet 3   ondansetron (ZOFRAN-ODT) 4 MG disintegrating tablet Take 1 tablet (4 mg total) by mouth every 4 (four) hours as needed for nausea or vomiting. Dissolve on tongue 90 tablet 3   promethazine (PHENERGAN) 25 MG tablet Take 1 tablet (25 mg total) by mouth every 6 (six) hours as needed for nausea. 30 tablet 3   senna-docusate (SENOKOT-S) 8.6-50 MG tablet Take 2 tablets by mouth at bedtime.     sucralfate (CARAFATE) 1 g tablet Take 1 g by mouth 4 (four) times daily.     traZODone (DESYREL) 50 MG tablet 1-2 TABLETS AT NIGHT AS NEEDED FOR SLEEP 60 tablet 1   Dextromethorphan-buPROPion ER (AUVELITY) 45-105 MG TBCR 1 in the AM for 1 week then 1 twice daily (Patient not taking: Reported on 07/22/2023) 60 tablet 1   No current facility-administered medications for this visit.    Medication Side Effects: None  Allergies:  Allergies  Allergen Reactions   Leucovorin Hives, Rash and Other (See Comments)    Redness on face and neck.  Confirmed that it was the leucovorin causing this on 11/19/21.  We are deleting this from future plan.   Clindamycin/Lincomycin Rash   Irinotecan Rash   Penicillins Rash    Reaction: 10 years    Past Medical History:  Diagnosis Date   Anemia    Iron De   Anxiety    Blood clot in vein    Bowel obstruction  (HCC)    Colon cancer (HCC) 2018   treated with surgery and chemotherapy   Depression    Diabetes mellitus without complication (HCC)    Type II   DVT (deep venous thrombosis) (HCC) 2019   behind right knee- while she wasa on chemo   GERD (gastroesophageal reflux disease)    History of blood transfusion    History of chemotherapy    History of kidney stones 2012   passed   Hypothyroidism    Obesity     Family History  Problem Relation Age of Onset   Irritable bowel syndrome  Mother    Colon polyps Father    Breast cancer Maternal Grandmother    Diabetes Maternal Grandmother    Diabetes Maternal Grandfather    Breast cancer Paternal Grandmother    Colon polyps Maternal Uncle    Stomach cancer Neg Hx    Pancreatic cancer Neg Hx    Esophageal cancer Neg Hx    Colon cancer Neg Hx     Social History   Socioeconomic History   Marital status: Widowed    Spouse name: Not on file   Number of children: Not on file   Years of education: Not on file   Highest education level: Not on file  Occupational History   Occupation: care coordinator   Tobacco Use   Smoking status: Never   Smokeless tobacco: Never  Vaping Use   Vaping status: Never Used  Substance and Sexual Activity   Alcohol use: Yes    Comment: rare   Drug use: Never   Sexual activity: Not Currently  Other Topics Concern   Not on file  Social History Narrative   Not on file   Social Drivers of Health   Financial Resource Strain: Not on file  Food Insecurity: Not on file  Transportation Needs: Not on file  Physical Activity: Not on file  Stress: Not on file  Social Connections: Not on file  Intimate Partner Violence: Not on file    Past Medical History, Surgical history, Social history, and Family history were reviewed and updated as appropriate.   Please see review of systems for further details on the patient's review from today.   Objective:   Physical Exam:  LMP 02/22/2019 (Approximate)    Physical Exam Constitutional:      General: She is not in acute distress. Musculoskeletal:        General: No deformity.  Neurological:     Mental Status: She is alert and oriented to person, place, and time.     Coordination: Coordination normal.  Psychiatric:        Attention and Perception: Attention and perception normal. She does not perceive auditory or visual hallucinations.        Mood and Affect: Mood is anxious and depressed. Affect is not labile, blunt or inappropriate.        Speech: Speech normal. Speech is not rapid and pressured.        Behavior: Behavior normal.        Thought Content: Thought content normal. Thought content is not paranoid or delusional. Thought content does not include homicidal or suicidal ideation.        Cognition and Memory: Cognition and memory normal.        Judgment: Judgment normal.     Comments: Insight intact Less but not gone re: anhedonia and depression     Lab Review:     Component Value Date/Time   NA 138 07/05/2023 1238   NA 138 03/15/2023 0000   K 4.6 07/05/2023 1238   CL 102 07/05/2023 1238   CO2 27 07/05/2023 1238   GLUCOSE 337 (H) 07/05/2023 1238   BUN 18 07/05/2023 1238   BUN 16 03/15/2023 0000   CREATININE 1.35 (H) 07/05/2023 1238   CALCIUM 9.3 07/05/2023 1238   PROT 5.7 (L) 07/05/2023 1238   ALBUMIN 3.6 07/05/2023 1238   AST 34 07/05/2023 1238   ALT 32 07/05/2023 1238   ALKPHOS 179 (H) 07/05/2023 1238   BILITOT 0.2 07/05/2023 1238   GFRNONAA 46 (L) 07/05/2023 1238  GFRNONAA 52 10/23/2022 0000       Component Value Date/Time   WBC 3.4 (L) 07/05/2023 1238   WBC 4.8 06/07/2020 0525   RBC 3.26 (L) 07/05/2023 1238   HGB 11.1 (L) 07/05/2023 1238   HCT 34.3 (L) 07/05/2023 1238   PLT 44 (L) 07/05/2023 1238   MCV 105.2 (H) 07/05/2023 1238   MCV 101 (A) 01/04/2023 0000   MCH 34.0 07/05/2023 1238   MCHC 32.4 07/05/2023 1238   RDW 14.1 07/05/2023 1238   LYMPHSABS 0.8 07/05/2023 1238   MONOABS 0.5 07/05/2023  1238   EOSABS 0.0 07/05/2023 1238   BASOSABS 0.0 07/05/2023 1238    No results found for: "POCLITH", "LITHIUM"   No results found for: "PHENYTOIN", "PHENOBARB", "VALPROATE", "CBMZ"   .res Assessment: Plan:    Severe recurrent major depression with psychotic features (HCC)  Mixed obsessional thoughts and acts  Delayed sleep phase syndrome  Mild cognitive impairment  Neoplastic malignant related fatigue   Overall mood and anxiety are well managed.  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  Partially better with the increase in Wellbutrin to 450 mg daily but still dep significantly interfereing with quality of life..  Discussed potential metabolic side effects associated with atypical antipsychotics, as well as potential risk for movement side effects. Advised pt to contact office if movement side effects occur.   Continue Vraylar 3 mg every other day  Wants to have access to Ambien but doesn't keep her asleep.  Continue Luvox to 2 and 1/2 tablets  DC Wellbutrin and trial Auvelity 1 Am and then BID for residual depression anhedonia.  If not helpful switch back.  If helpful will need to use generic products.  This was discussed.  OK continue Modafinil to 300 mg daily for excessive sleepiness and cancer related fatigue  trazodone 50-100 mg HS Option Lunesta alternative  Disc polypharmacy and will try to eliminate some meds if depression improves.  Option reduce fluvox at follow up.   FU 8 weeks  Meredith Staggers, MD, DFAPA   Please see After Visit Summary for patient specific instructions.  Future Appointments  Date Time Provider Department Center  08/02/2023 11:00 AM Rennis Harding A, MD [facility] None  08/02/2023 11:30 AM [facility] INFUSION CHAIR 1 [facility] None  08/04/2023  2:30 PM [facility]JECTION [facility] None  08/09/2023  2:30 PM [facility] None  08/10/2023  2:30 PM [facility]  None  08/11/2023  2:30 PM [facility] None  10/04/2023  7:00 AM [facility] None    No orders of the defined types were placed in this encounter.      -------------------------------

## 2023-07-22 NOTE — Patient Instructions (Signed)
Bupropion is Wellbutrin.  You have been on XL 150 mg tablets, 3 each morning. Stop this and start Auvelity 1 twice daily.

## 2023-07-23 DIAGNOSIS — C787 Secondary malignant neoplasm of liver and intrahepatic bile duct: Secondary | ICD-10-CM | POA: Diagnosis not present

## 2023-07-23 DIAGNOSIS — C189 Malignant neoplasm of colon, unspecified: Secondary | ICD-10-CM | POA: Diagnosis not present

## 2023-07-27 ENCOUNTER — Other Ambulatory Visit: Payer: Self-pay | Admitting: Oncology

## 2023-07-27 DIAGNOSIS — C182 Malignant neoplasm of ascending colon: Secondary | ICD-10-CM

## 2023-07-27 DIAGNOSIS — C189 Malignant neoplasm of colon, unspecified: Secondary | ICD-10-CM

## 2023-07-30 ENCOUNTER — Other Ambulatory Visit: Payer: Self-pay | Admitting: Psychiatry

## 2023-07-30 DIAGNOSIS — F422 Mixed obsessional thoughts and acts: Secondary | ICD-10-CM

## 2023-08-01 NOTE — Progress Notes (Unsigned)
 [facility]  [address] [employer] [place],  [place]  [address] [phone]  Clinic Day:  08/02/2023  Referring physician: Alinda Deem, MD  HISTORY OF PRESENT ILLNESS:  The patient is a 58 y.o. female with  metastatic colon cancer, which includes spread of disease to her abdominal cavity, lungs, brain and right adrenal gland.  She comes in today to go over her CT scans to ascertain her new disease baseline after receiving 20 cycles of  FOLFOX/Avastin.  Of note, her cycles of chemotherapy have been spaced out to every 3 weeks.  Since her last visit, the patient has been doing well.  She recalls having intermittent abdominal pain, nausea, and vomiting a few weeks ago, which has all dissipated.  Otherwise, she denies having any new symptoms or findings which concern her for overt signs of disease progression.    With respect to her colon cancer history, she is status post a right hemicolectomy in early October 2018, followed by 12 cycles of adjuvant FOLFOX chemotherapy, which were completed in April 2019.  In December 2021, she underwent a left cerebellar metastasectomy, whose pathology was consistent with metastatic colon cancer.  Tumor testing did come back MMR normal. CT scans revealed evidence of her cancer being in multiple locations, for which she took 40 cycles of FOLFIRI/Avastin.  As follow-up scans revealed liver metastasis, she was placed back on FOLFOX/Avastin, for which she continues to take.  PHYSICAL EXAM:  Blood pressure 109/77, pulse 93, temperature 97.7 F (36.5 C), temperature source Oral, resp. rate 16, height 5\' 8"  (1.727 m), weight 153 lb 12.8 oz (69.8 kg), last menstrual period 02/22/2019, SpO2 96%. Body mass index is 23.39 kg/m.  Performance status (ECOG): 1 - Symptomatic but completely ambulatory  Physical Exam Vitals and nursing note reviewed.  Constitutional:      General: She is not in acute distress.    Appearance: Normal appearance.   HENT:     Head: Normocephalic and atraumatic.     Mouth/Throat:     Mouth: Mucous membranes are moist.     Pharynx: Oropharynx is clear. No oropharyngeal exudate or posterior oropharyngeal erythema.  Eyes:     General: No scleral icterus.    Extraocular Movements: Extraocular movements intact.     Conjunctiva/sclera: Conjunctivae normal.     Pupils: Pupils are equal, round, and reactive to light.  Cardiovascular:     Rate and Rhythm: Normal rate and regular rhythm.     Heart sounds: Normal heart sounds. No murmur heard.    No friction rub. No gallop.  Pulmonary:     Effort: Pulmonary effort is normal.     Breath sounds: Normal breath sounds. No wheezing, rhonchi or rales.  Abdominal:     General: There is no distension.     Palpations: Abdomen is soft. There is no hepatomegaly, splenomegaly or mass.     Tenderness: There is no abdominal tenderness.  Musculoskeletal:        General: Normal range of motion.     Cervical back: Normal range of motion and neck supple. No tenderness.     Right lower leg: No edema.     Left lower leg: No edema.  Lymphadenopathy:     Cervical: No cervical adenopathy.     Upper Body:     Right upper body: No supraclavicular or axillary adenopathy.     Left upper body: No supraclavicular or axillary adenopathy.     Lower Body: No right inguinal adenopathy.  No left inguinal adenopathy.  Skin:    General: Skin is warm and dry.     Coloration: Skin is not jaundiced.     Findings: No rash.  Neurological:     Mental Status: She is alert and oriented to person, place, and time.     Cranial Nerves: No cranial nerve deficit.  Psychiatric:        Mood and Affect: Mood normal.        Behavior: Behavior normal.        Thought Content: Thought content normal.   SCANS:  CT scans of his abdomen/pelvis revealed the following: FINDINGS: Unremarkable base of neck and axilla. Thoracic spine degeneration. Normal esophagus. Normal heart size. No acute vascular  pathology. Right-sided port. No acute chest wall pathology.  The central airways are patent. No consolidation, effusion or pneumothorax.  On the left, multiple noncalcified lung nodules. Series 2, image 13, 6 x 9 mm upper lobe nodule, previous a region of cystic or necrotic nodule measuring about the same. Image 24, 6 mm noncalcified lower lobe nodule, previously a region of cystic or necrotic nodule measuring about the same. Multiple additional smaller lung nodules, also present previously.  On the right, multiple noncalcified lung nodules. In the posterior right upper lobe, there is nodular scarring, series 2, image 21, similar to the previous examination, allowing for technical differences. In the right lower lobe, multiple nodular densities, series 2, image 39, dominant lesion measures 13 x 21 mm, stable.  Diffuse hepatic steatosis. There is a history of treated liver metastatic disease, no evidence for new lesions.  There is gallbladder wall thickening, enhancement, adjacent fluid/fat stranding. The spleen is mildly enlarged, 14.7 cm in length. The pancreas and adrenal glands are unremarkable. Normal kidneys. No hydronephrosis. Unremarkable bladder.  Status post hysterectomy. No retroperitoneal mass. There is perirectal fat stranding. No free air.  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 Recommend liver duplex imaging. Splenic vein is distended.  Lumbar spine degeneration. Multiple incisional midline hernias. Rectus muscle diastasis.  Nonobstructed bowel. Status post right hemicolectomy with ileocolic anastomosis.  IMPRESSION: Stable lung metastatic disease.  No visualized liver lesions, there is a history of treated liver metastatic disease.  Partial thrombosis of the SMV, portal confluence, main portal vein, and right portal vein, interval worsening. Recommend liver  duplex imaging.  Splenic enlargement without focal lesion.  Status post right hemicolectomy, with ileocolic anastomosis.  LABS:      Latest Ref Rng & Units 08/02/2023   11:50 AM 07/05/2023   12:38 PM 06/28/2023   10:14 AM  CBC  WBC 4.0 - 10.5 K/uL 2.1  3.4  3.3   Hemoglobin 12.0 - 15.0 g/dL 9.2  82.9  56.2   Hematocrit 36.0 - 46.0 % 28.6  34.3  36.5   Platelets 150 - 400 K/uL 35  44  43       Latest Ref Rng & Units 08/02/2023   11:50 AM 07/05/2023   12:38 PM 06/28/2023   10:14 AM  CMP  Glucose 70 - 99 mg/dL 130  865  784   BUN 6 - 20 mg/dL 12  18  12    Creatinine 0.44 - 1.00 mg/dL 6.96  2.95  2.84   Sodium 135 - 145 mmol/L 145  138  141   Potassium 3.5 - 5.1 mmol/L 3.4  4.6  4.6   Chloride 98 - 111 mmol/L 114  102  103   CO2 22 - 32 mmol/L 23  27  27    Calcium 8.9 - 10.3 mg/dL 7.0  9.3  9.5   Total Protein 6.5 - 8.1 g/dL 4.3  5.7  6.1   Total Bilirubin 0.0 - 1.2 mg/dL 0.3  0.2  0.4   Alkaline Phos 38 - 126 U/L 121  179  185   AST 15 - 41 U/L 16  34  36   ALT 0 - 44 U/L 11  32  31    ASSESSMENT & PLAN:  Assessment/Plan:  A 58 y.o. female with metastatic colon cancer.  In clinic today, I went over all of her CT scan images with her and the, for which she could see that her metastatic disease remains under very good control.  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  My concern is the previous abdominal pain and nausea/vomiting she was having could be related to this.  Clinically, the patient looks great today.  Ideally, for clots this extensive, I would normally recommend some form of anticoagulation.  However, her platelet count today is very low at 35.  This concerns me for her being a big-time bleeding risk.  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  Due to her profound cytopenias, I also believe  will be important to hold her chemotherapy for at least another week to allow her peripheral counts to recuperate.  Overall, the patient appears to be doing well.  I will see her back in 3-4 weeks before she heads the into her potential 22nd of cycle of FOLFOX/Avastin.  The patient understands all the plans discussed today and is in agreement with them.  Tierany Appleby Kirby Funk, MD

## 2023-08-02 ENCOUNTER — Inpatient Hospital Stay: Payer: 59 | Attending: Oncology | Admitting: Oncology

## 2023-08-02 ENCOUNTER — Other Ambulatory Visit: Payer: Self-pay | Admitting: Oncology

## 2023-08-02 ENCOUNTER — Other Ambulatory Visit: Payer: Self-pay | Admitting: Pharmacist

## 2023-08-02 ENCOUNTER — Encounter: Payer: Self-pay | Admitting: Oncology

## 2023-08-02 ENCOUNTER — Inpatient Hospital Stay

## 2023-08-02 ENCOUNTER — Inpatient Hospital Stay: Payer: 59

## 2023-08-02 ENCOUNTER — Telehealth: Payer: Self-pay | Admitting: Oncology

## 2023-08-02 ENCOUNTER — Ambulatory Visit: Payer: 59 | Admitting: Oncology

## 2023-08-02 ENCOUNTER — Ambulatory Visit: Payer: 59

## 2023-08-02 VITALS — BP 109/77 | HR 93 | Temp 97.7°F | Resp 16 | Ht 68.0 in | Wt 153.8 lb

## 2023-08-02 DIAGNOSIS — Z5189 Encounter for other specified aftercare: Secondary | ICD-10-CM | POA: Diagnosis not present

## 2023-08-02 DIAGNOSIS — C787 Secondary malignant neoplasm of liver and intrahepatic bile duct: Secondary | ICD-10-CM | POA: Diagnosis not present

## 2023-08-02 DIAGNOSIS — C189 Malignant neoplasm of colon, unspecified: Secondary | ICD-10-CM | POA: Diagnosis not present

## 2023-08-02 DIAGNOSIS — I81 Portal vein thrombosis: Secondary | ICD-10-CM | POA: Diagnosis not present

## 2023-08-02 DIAGNOSIS — Z5111 Encounter for antineoplastic chemotherapy: Secondary | ICD-10-CM | POA: Insufficient documentation

## 2023-08-02 DIAGNOSIS — C7931 Secondary malignant neoplasm of brain: Secondary | ICD-10-CM | POA: Insufficient documentation

## 2023-08-02 DIAGNOSIS — C7801 Secondary malignant neoplasm of right lung: Secondary | ICD-10-CM | POA: Diagnosis not present

## 2023-08-02 DIAGNOSIS — C7802 Secondary malignant neoplasm of left lung: Secondary | ICD-10-CM | POA: Insufficient documentation

## 2023-08-02 DIAGNOSIS — C7971 Secondary malignant neoplasm of right adrenal gland: Secondary | ICD-10-CM | POA: Insufficient documentation

## 2023-08-02 DIAGNOSIS — D759 Disease of blood and blood-forming organs, unspecified: Secondary | ICD-10-CM | POA: Diagnosis not present

## 2023-08-02 DIAGNOSIS — D696 Thrombocytopenia, unspecified: Secondary | ICD-10-CM | POA: Insufficient documentation

## 2023-08-02 DIAGNOSIS — C182 Malignant neoplasm of ascending colon: Secondary | ICD-10-CM

## 2023-08-02 DIAGNOSIS — C786 Secondary malignant neoplasm of retroperitoneum and peritoneum: Secondary | ICD-10-CM | POA: Insufficient documentation

## 2023-08-02 LAB — CMP (CANCER CENTER ONLY)
ALT: 11 U/L (ref 0–44)
AST: 16 U/L (ref 15–41)
Albumin: 2.6 g/dL — ABNORMAL LOW (ref 3.5–5.0)
Alkaline Phosphatase: 121 U/L (ref 38–126)
Anion gap: 8 (ref 5–15)
BUN: 12 mg/dL (ref 6–20)
CO2: 23 mmol/L (ref 22–32)
Calcium: 7 mg/dL — ABNORMAL LOW (ref 8.9–10.3)
Chloride: 114 mmol/L — ABNORMAL HIGH (ref 98–111)
Creatinine: 1.07 mg/dL — ABNORMAL HIGH (ref 0.44–1.00)
GFR, Estimated: >60 mL/min (ref >60)
Glucose, Bld: 159 mg/dL — ABNORMAL HIGH (ref 70–99)
Potassium: 3.4 mmol/L — ABNORMAL LOW (ref 3.5–5.1)
Sodium: 145 mmol/L (ref 135–145)
Total Bilirubin: 0.3 mg/dL (ref 0.0–1.2)
Total Protein: 4.3 g/dL — ABNORMAL LOW (ref 6.5–8.1)

## 2023-08-02 LAB — CBC WITH DIFFERENTIAL (CANCER CENTER ONLY)
Abs Immature Granulocytes: 0 10*3/uL (ref 0.00–0.07)
Basophils Absolute: 0 10*3/uL (ref 0.0–0.1)
Basophils Relative: 1 %
Eosinophils Absolute: 0 10*3/uL (ref 0.0–0.5)
Eosinophils Relative: 1 %
HCT: 28.6 % — ABNORMAL LOW (ref 36.0–46.0)
Hemoglobin: 9.2 g/dL — ABNORMAL LOW (ref 12.0–15.0)
Immature Granulocytes: 0 %
Immature Platelet Fraction: 1.9 % (ref 1.2–8.6)
Lymphocytes Relative: 26 %
Lymphs Abs: 0.5 10*3/uL — ABNORMAL LOW (ref 0.7–4.0)
MCH: 33.8 pg (ref 26.0–34.0)
MCHC: 32.2 g/dL (ref 30.0–36.0)
MCV: 105.1 fL — ABNORMAL HIGH (ref 80.0–100.0)
Monocytes Absolute: 0.3 10*3/uL (ref 0.1–1.0)
Monocytes Relative: 15 %
Neutro Abs: 1.2 10*3/uL — ABNORMAL LOW (ref 1.7–7.7)
Neutrophils Relative %: 57 %
Platelet Count: 35 10*3/uL — ABNORMAL LOW (ref 150–400)
RBC: 2.72 MIL/uL — ABNORMAL LOW (ref 3.87–5.11)
RDW: 13.7 % (ref 11.5–15.5)
WBC Count: 2.1 10*3/uL — ABNORMAL LOW (ref 4.0–10.5)
nRBC: 0 % (ref 0.0–0.2)
nRBC: 0 /100{WBCs}

## 2023-08-02 LAB — TOTAL PROTEIN, URINE DIPSTICK: Protein, ur: NEGATIVE mg/dL

## 2023-08-02 MED ORDER — SODIUM CHLORIDE 0.9% FLUSH: 10.0000 mL | INTRAVENOUS | Status: DC | PRN

## 2023-08-02 NOTE — Telephone Encounter (Signed)
 Patient has been scheduled for follow-up visit per 08/02/23 LOS.  Pt given an appt calendar with date and time.

## 2023-08-02 NOTE — Progress Notes (Signed)
 Hold chemo x 1 week due to plt=35K and ANC=1200 per Dr. Melvyn Neth.

## 2023-08-02 NOTE — Progress Notes (Signed)
 Treatment held per Melvyn Neth- Patient states understanding '

## 2023-08-03 ENCOUNTER — Telehealth: Payer: Self-pay

## 2023-08-03 ENCOUNTER — Encounter: Payer: Self-pay | Admitting: Oncology

## 2023-08-03 NOTE — Telephone Encounter (Addendum)
 08/03/23 -@ 1535- Dr Melvyn Neth states he is going to call pt himself in just a few moments.-awc  ASSESSMENT & PLAN:  Assessment/Plan:  A 58 y.o. female with metastatic colon cancer.  In clinic today, I went over all of her CT scan images with her and the, for which she could see that her metastatic disease remains under very good control.  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  My concern is the previous abdominal pain and nausea/vomiting she was having could be related to this.  Clinically, the patient looks great today.  Ideally, for clots this extensive, I would normally recommend some form of anticoagulation.  However, her platelet count today is very low at 35.  This concerns me for her being a big-time bleeding risk.  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  Due to her profound cytopenias, I also believe will be important to hold her chemotherapy for at least another week to allow her peripheral counts to recuperate.  Overall, the patient appears to be doing well.  I will see her back in 3-4 weeks before she heads the into her potential 22nd of cycle of FOLFOX/Avastin.  The patient understands all the plans discussed today and is in agreement with them.   Weston Settle, MD                  Electronically signed by Weston Settle, MD at 08/02/2023  1:30 PM

## 2023-08-04 ENCOUNTER — Encounter: Payer: Self-pay | Admitting: Oncology

## 2023-08-04 ENCOUNTER — Inpatient Hospital Stay: Payer: 59

## 2023-08-09 ENCOUNTER — Ambulatory Visit: Payer: 59

## 2023-08-09 ENCOUNTER — Telehealth: Payer: Self-pay | Admitting: Psychiatry

## 2023-08-09 ENCOUNTER — Other Ambulatory Visit: Payer: Self-pay

## 2023-08-09 ENCOUNTER — Inpatient Hospital Stay

## 2023-08-09 DIAGNOSIS — C189 Malignant neoplasm of colon, unspecified: Secondary | ICD-10-CM

## 2023-08-09 DIAGNOSIS — C7802 Secondary malignant neoplasm of left lung: Secondary | ICD-10-CM | POA: Diagnosis not present

## 2023-08-09 DIAGNOSIS — D696 Thrombocytopenia, unspecified: Secondary | ICD-10-CM | POA: Diagnosis not present

## 2023-08-09 DIAGNOSIS — C182 Malignant neoplasm of ascending colon: Secondary | ICD-10-CM

## 2023-08-09 DIAGNOSIS — C786 Secondary malignant neoplasm of retroperitoneum and peritoneum: Secondary | ICD-10-CM | POA: Diagnosis not present

## 2023-08-09 DIAGNOSIS — C7931 Secondary malignant neoplasm of brain: Secondary | ICD-10-CM | POA: Diagnosis not present

## 2023-08-09 DIAGNOSIS — I81 Portal vein thrombosis: Secondary | ICD-10-CM | POA: Diagnosis not present

## 2023-08-09 DIAGNOSIS — Z5189 Encounter for other specified aftercare: Secondary | ICD-10-CM | POA: Diagnosis not present

## 2023-08-09 DIAGNOSIS — C787 Secondary malignant neoplasm of liver and intrahepatic bile duct: Secondary | ICD-10-CM | POA: Diagnosis not present

## 2023-08-09 DIAGNOSIS — D759 Disease of blood and blood-forming organs, unspecified: Secondary | ICD-10-CM | POA: Diagnosis not present

## 2023-08-09 DIAGNOSIS — Z5111 Encounter for antineoplastic chemotherapy: Secondary | ICD-10-CM | POA: Diagnosis not present

## 2023-08-09 DIAGNOSIS — C7971 Secondary malignant neoplasm of right adrenal gland: Secondary | ICD-10-CM | POA: Diagnosis not present

## 2023-08-09 DIAGNOSIS — C7801 Secondary malignant neoplasm of right lung: Secondary | ICD-10-CM | POA: Diagnosis not present

## 2023-08-09 LAB — CMP (CANCER CENTER ONLY)
ALT: 19 U/L (ref 0–44)
AST: 32 U/L (ref 15–41)
Albumin: 3.9 g/dL (ref 3.5–5.0)
Alkaline Phosphatase: 228 U/L — ABNORMAL HIGH (ref 38–126)
Anion gap: 11 (ref 5–15)
BUN: 15 mg/dL (ref 6–20)
CO2: 28 mmol/L (ref 22–32)
Calcium: 9.5 mg/dL (ref 8.9–10.3)
Chloride: 105 mmol/L (ref 98–111)
Creatinine: 1.47 mg/dL — ABNORMAL HIGH (ref 0.44–1.00)
GFR, Estimated: 42 mL/min — ABNORMAL LOW (ref >60)
Glucose, Bld: 139 mg/dL — ABNORMAL HIGH (ref 70–99)
Potassium: 4.2 mmol/L (ref 3.5–5.1)
Sodium: 143 mmol/L (ref 135–145)
Total Bilirubin: 0.3 mg/dL (ref 0.0–1.2)
Total Protein: 6.3 g/dL — ABNORMAL LOW (ref 6.5–8.1)

## 2023-08-09 LAB — CBC WITH DIFFERENTIAL (CANCER CENTER ONLY)
Abs Immature Granulocytes: 0.01 10*3/uL (ref 0.00–0.07)
Basophils Absolute: 0 10*3/uL (ref 0.0–0.1)
Basophils Relative: 0 %
Eosinophils Absolute: 0.1 10*3/uL (ref 0.0–0.5)
Eosinophils Relative: 2 %
HCT: 38.4 % (ref 36.0–46.0)
Hemoglobin: 12.5 g/dL (ref 12.0–15.0)
Immature Granulocytes: 0 %
Immature Platelet Fraction: 2.1 % (ref 1.2–8.6)
Lymphocytes Relative: 31 %
Lymphs Abs: 1 10*3/uL (ref 0.7–4.0)
MCH: 33.3 pg (ref 26.0–34.0)
MCHC: 32.6 g/dL (ref 30.0–36.0)
MCV: 102.4 fL — ABNORMAL HIGH (ref 80.0–100.0)
Monocytes Absolute: 0.4 10*3/uL (ref 0.1–1.0)
Monocytes Relative: 12 %
Neutro Abs: 1.8 10*3/uL (ref 1.7–7.7)
Neutrophils Relative %: 55 %
Platelet Count: 48 10*3/uL — ABNORMAL LOW (ref 150–400)
RBC: 3.75 MIL/uL — ABNORMAL LOW (ref 3.87–5.11)
RDW: 13.5 % (ref 11.5–15.5)
WBC Count: 3.2 10*3/uL — ABNORMAL LOW (ref 4.0–10.5)
nRBC: 0 % (ref 0.0–0.2)
nRBC: 0 /100{WBCs}

## 2023-08-09 LAB — TOTAL PROTEIN, URINE DIPSTICK: Protein, ur: NEGATIVE mg/dL

## 2023-08-09 MED ORDER — SODIUM CHLORIDE 0.9 % IV SOLN
Freq: Once | INTRAVENOUS | Status: AC
Start: 2023-08-09 — End: 2023-08-09

## 2023-08-09 MED ORDER — FAMOTIDINE IN NACL 20-0.9 MG/50ML-% IV SOLN
20.0000 mg | Freq: Once | INTRAVENOUS | Status: AC
  Administered 2023-08-09: 20 mg via INTRAVENOUS
  Filled 2023-08-09: qty 50

## 2023-08-09 MED ORDER — DIPHENHYDRAMINE HCL 50 MG/ML IJ SOLN
25.0000 mg | Freq: Once | INTRAMUSCULAR | Status: AC
  Administered 2023-08-09: 25 mg via INTRAVENOUS
  Filled 2023-08-09: qty 1

## 2023-08-09 MED ORDER — FLUOROURACIL CHEMO INJECTION 2.5 GM/50ML
400.0000 mg/m2 | Freq: Once | INTRAVENOUS | Status: AC
  Administered 2023-08-09: 700 mg via INTRAVENOUS
  Filled 2023-08-09: qty 14

## 2023-08-09 MED ORDER — FOSAPREPITANT DIMEGLUMINE INJECTION 150 MG
150.0000 mg | Freq: Once | INTRAVENOUS | Status: AC
  Administered 2023-08-09: 150 mg via INTRAVENOUS
  Filled 2023-08-09: qty 150

## 2023-08-09 MED ORDER — OXALIPLATIN CHEMO INJECTION 100 MG/20ML
63.7500 mg/m2 | Freq: Once | INTRAVENOUS | Status: AC
  Administered 2023-08-09: 115 mg via INTRAVENOUS
  Filled 2023-08-09: qty 20

## 2023-08-09 MED ORDER — SODIUM CHLORIDE 0.9 % IV SOLN
5.0000 mg/kg | Freq: Once | INTRAVENOUS | Status: AC
  Administered 2023-08-09: 350 mg via INTRAVENOUS
  Filled 2023-08-09: qty 14

## 2023-08-09 MED ORDER — DEXTROSE 5 % IV SOLN
Freq: Once | INTRAVENOUS | Status: AC
Start: 2023-08-09 — End: 2023-08-09

## 2023-08-09 MED ORDER — SODIUM CHLORIDE 0.9 % IV SOLN
2400.0000 mg/m2 | INTRAVENOUS | Status: DC
  Administered 2023-08-09: 4350 mg via INTRAVENOUS
  Filled 2023-08-09: qty 87

## 2023-08-09 MED ORDER — DEXAMETHASONE SODIUM PHOSPHATE 10 MG/ML IJ SOLN
10.0000 mg | Freq: Once | INTRAMUSCULAR | Status: AC
  Administered 2023-08-09: 10 mg via INTRAVENOUS
  Filled 2023-08-09: qty 1

## 2023-08-09 MED ORDER — PALONOSETRON HCL INJECTION 0.25 MG/5ML
0.2500 mg | Freq: Once | INTRAVENOUS | Status: AC
  Administered 2023-08-09: 0.25 mg via INTRAVENOUS
  Filled 2023-08-09: qty 5

## 2023-08-09 NOTE — Telephone Encounter (Signed)
 Kindra called and LM at 2:03 to request refill of her modafinil.  Appt 4/15.  Send to   [id number] - [place], [place] - [address]

## 2023-08-09 NOTE — Progress Notes (Signed)
 Okay to treat with platelets of 48. Will need to be set up with a unit of platelets

## 2023-08-09 NOTE — Patient Instructions (Signed)
 Oxaliplatin Injection What is this medication? OXALIPLATIN (ox AL i PLA tin) treats colorectal cancer. It works by slowing down the growth of cancer cells. This medicine may be used for other purposes; ask your health care provider or pharmacist if you have questions. COMMON BRAND NAME(S): Eloxatin What should I tell my care team before I take this medication? They need to know if you have any of these conditions: Heart disease History of irregular heartbeat or rhythm Liver disease Low blood cell levels (white cells, red cells, and platelets) Lung or breathing disease, such as asthma Take medications that treat or prevent blood clots Tingling of the fingers, toes, or other nerve disorder An unusual or allergic reaction to oxaliplatin, other medications, foods, dyes, or preservatives If you or your partner are pregnant or trying to get pregnant Breast-feeding How should I use this medication? This medication is injected into a vein. It is given by your care team in a hospital or clinic setting. Talk to your care team about the use of this medication in children. Special care may be needed. Overdosage: If you think you have taken too much of this medicine contact a poison control center or emergency room at once. NOTE: This medicine is only for you. Do not share this medicine with others. What if I miss a dose? Keep appointments for follow-up doses. It is important not to miss a dose. Call your care team if you are unable to keep an appointment. What may interact with this medication? Do not take this medication with any of the following: Cisapride Dronedarone Pimozide Thioridazine This medication may also interact with the following: Aspirin and aspirin-like medications Certain medications that treat or prevent blood clots, such as warfarin, apixaban, dabigatran, and rivaroxaban Cisplatin Cyclosporine Diuretics Medications for infection, such as acyclovir, adefovir, amphotericin B,  bacitracin, cidofovir, foscarnet, ganciclovir, gentamicin, pentamidine, vancomycin NSAIDs, medications for pain and inflammation, such as ibuprofen or naproxen Other medications that cause heart rhythm changes Pamidronate Zoledronic acid This list may not describe all possible interactions. Give your health care provider a list of all the medicines, herbs, non-prescription drugs, or dietary supplements you use. Also tell them if you smoke, drink alcohol, or use illegal drugs. Some items may interact with your medicine. What should I watch for while using this medication? Your condition will be monitored carefully while you are receiving this medication. You may need blood work while taking this medication. This medication may make you feel generally unwell. This is not uncommon as chemotherapy can affect healthy cells as well as cancer cells. Report any side effects. Continue your course of treatment even though you feel ill unless your care team tells you to stop. This medication may increase your risk of getting an infection. Call your care team for advice if you get a fever, chills, sore throat, or other symptoms of a cold or flu. Do not treat yourself. Try to avoid being around people who are sick. Avoid taking medications that contain aspirin, acetaminophen, ibuprofen, naproxen, or ketoprofen unless instructed by your care team. These medications may hide a fever. Be careful brushing or flossing your teeth or using a toothpick because you may get an infection or bleed more easily. If you have any dental work done, tell your dentist you are receiving this medication. This medication can make you more sensitive to cold. Do not drink cold drinks or use ice. Cover exposed skin before coming in contact with cold temperatures or cold objects. When out in cold weather  wear warm clothing and cover your mouth and nose to warm the air that goes into your lungs. Tell your care team if you get sensitive to the  cold. Talk to your care team if you or your partner are pregnant or think either of you might be pregnant. This medication can cause serious birth defects if taken during pregnancy and for 9 months after the last dose. A negative pregnancy test is required before starting this medication. A reliable form of contraception is recommended while taking this medication and for 9 months after the last dose. Talk to your care team about effective forms of contraception. Do not father a child while taking this medication and for 6 months after the last dose. Use a condom while having sex during this time period. Do not breastfeed while taking this medication and for 3 months after the last dose. This medication may cause infertility. Talk to your care team if you are concerned about your fertility. What side effects may I notice from receiving this medication? Side effects that you should report to your care team as soon as possible: Allergic reactions--skin rash, itching, hives, swelling of the face, lips, tongue, or throat Bleeding--bloody or black, tar-like stools, vomiting blood or brown material that looks like coffee grounds, red or dark brown urine, small red or purple spots on skin, unusual bruising or bleeding Dry cough, shortness of breath or trouble breathing Heart rhythm changes--fast or irregular heartbeat, dizziness, feeling faint or lightheaded, chest pain, trouble breathing Infection--fever, chills, cough, sore throat, wounds that don't heal, pain or trouble when passing urine, general feeling of discomfort or being unwell Liver injury--right upper belly pain, loss of appetite, nausea, light-colored stool, dark yellow or brown urine, yellowing skin or eyes, unusual weakness or fatigue Low red blood cell level--unusual weakness or fatigue, dizziness, headache, trouble breathing Muscle injury--unusual weakness or fatigue, muscle pain, dark yellow or brown urine, decrease in amount of urine Pain,  tingling, or numbness in the hands or feet Sudden and severe headache, confusion, change in vision, seizures, which may be signs of posterior reversible encephalopathy syndrome (PRES) Unusual bruising or bleeding Side effects that usually do not require medical attention (report to your care team if they continue or are bothersome): Diarrhea Nausea Pain, redness, or swelling with sores inside the mouth or throat Unusual weakness or fatigue Vomiting This list may not describe all possible side effects. Call your doctor for medical advice about side effects. You may report side effects to FDA at 1-800-FDA-1088. Where should I keep my medication? This medication is given in a hospital or clinic. It will not be stored at home. NOTE: This sheet is a summary. It may not cover all possible information. If you have questions about this medicine, talk to your doctor, pharmacist, or health care provider.  2024 Elsevier/Gold Standard (2023-04-30 00:00:00)Bevacizumab Injection What is this medication? BEVACIZUMAB (be va SIZ yoo mab) treats some types of cancer. It works by blocking a protein that causes cancer cells to grow and multiply. This helps to slow or stop the spread of cancer cells. It is a monoclonal antibody. This medicine may be used for other purposes; ask your health care provider or pharmacist if you have questions. COMMON BRAND NAME(S): Alymsys, Avastin, MVASI, Rosaland Lao What should I tell my care team before I take this medication? They need to know if you have any of these conditions: Blood clots Coughing up blood Having or recent surgery Heart failure High blood pressure History of  a connection between 2 or more body parts that do not usually connect (fistula) History of a tear in your stomach or intestines Protein in your urine An unusual or allergic reaction to bevacizumab, other medications, foods, dyes, or preservatives Pregnant or trying to get  pregnant Breast-feeding How should I use this medication? This medication is injected into a vein. It is given by your care team in a hospital or clinic setting. Talk to your care team the use of this medication in children. Special care may be needed. Overdosage: If you think you have taken too much of this medicine contact a poison control center or emergency room at once. NOTE: This medicine is only for you. Do not share this medicine with others. What if I miss a dose? Keep appointments for follow-up doses. It is important not to miss your dose. Call your care team if you are unable to keep an appointment. What may interact with this medication? Interactions are not expected. This list may not describe all possible interactions. Give your health care provider a list of all the medicines, herbs, non-prescription drugs, or dietary supplements you use. Also tell them if you smoke, drink alcohol, or use illegal drugs. Some items may interact with your medicine. What should I watch for while using this medication? Your condition will be monitored carefully while you are receiving this medication. You may need blood work while taking this medication. This medication may make you feel generally unwell. This is not uncommon as chemotherapy can affect healthy cells as well as cancer cells. Report any side effects. Continue your course of treatment even though you feel ill unless your care team tells you to stop. This medication may increase your risk to bruise or bleed. Call your care team if you notice any unusual bleeding. Before having surgery, talk to your care team to make sure it is ok. This medication can increase the risk of poor healing of your surgical site or wound. You will need to stop this medication for 28 days before surgery. After surgery, wait at least 28 days before restarting this medication. Make sure the surgical site or wound is healed enough before restarting this medication. Talk  to your care team if questions. Talk to your care team if you may be pregnant. Serious birth defects can occur if you take this medication during pregnancy and for 6 months after the last dose. Contraception is recommended while taking this medication and for 6 months after the last dose. Your care team can help you find the option that works for you. Do not breastfeed while taking this medication and for 6 months after the last dose. This medication can cause infertility. Talk to your care team if you are concerned about your fertility. What side effects may I notice from receiving this medication? Side effects that you should report to your care team as soon as possible: Allergic reactions--skin rash, itching, hives, swelling of the face, lips, tongue, or throat Bleeding--bloody or black, tar-like stools, vomiting blood or brown material that looks like coffee grounds, red or dark brown urine, small red or purple spots on skin, unusual bruising or bleeding Blood clot--pain, swelling, or warmth in the leg, shortness of breath, chest pain Heart attack--pain or tightness in the chest, shoulders, arms, or jaw, nausea, shortness of breath, cold or clammy skin, feeling faint or lightheaded Heart failure--shortness of breath, swelling of the ankles, feet, or hands, sudden weight gain, unusual weakness or fatigue Increase in blood pressure Infection--fever,  chills, cough, sore throat, wounds that don't heal, pain or trouble when passing urine, general feeling of discomfort or being unwell Infusion reactions--chest pain, shortness of breath or trouble breathing, feeling faint or lightheaded Kidney injury--decrease in the amount of urine, swelling of the ankles, hands, or feet Stomach pain that is severe, does not go away, or gets worse Stroke--sudden numbness or weakness of the face, arm, or leg, trouble speaking, confusion, trouble walking, loss of balance or coordination, dizziness, severe headache,  change in vision Sudden and severe headache, confusion, change in vision, seizures, which may be signs of posterior reversible encephalopathy syndrome (PRES) Side effects that usually do not require medical attention (report to your care team if they continue or are bothersome): Back pain Change in taste Diarrhea Dry skin Increased tears Nosebleed This list may not describe all possible side effects. Call your doctor for medical advice about side effects. You may report side effects to FDA at 1-800-FDA-1088. Where should I keep my medication? This medication is given in a hospital or clinic. It will not be stored at home. NOTE: This sheet is a summary. It may not cover all possible information. If you have questions about this medicine, talk to your doctor, pharmacist, or health care provider.  2024 Elsevier/Gold Standard (2021-10-03 00:00:00)

## 2023-08-10 ENCOUNTER — Ambulatory Visit: Payer: 59

## 2023-08-11 ENCOUNTER — Ambulatory Visit: Payer: 59

## 2023-08-11 ENCOUNTER — Inpatient Hospital Stay

## 2023-08-11 VITALS — BP 120/74 | HR 91 | Temp 98.0°F | Resp 18

## 2023-08-11 DIAGNOSIS — C786 Secondary malignant neoplasm of retroperitoneum and peritoneum: Secondary | ICD-10-CM | POA: Diagnosis not present

## 2023-08-11 DIAGNOSIS — Z5111 Encounter for antineoplastic chemotherapy: Secondary | ICD-10-CM | POA: Diagnosis not present

## 2023-08-11 DIAGNOSIS — C189 Malignant neoplasm of colon, unspecified: Secondary | ICD-10-CM | POA: Diagnosis not present

## 2023-08-11 DIAGNOSIS — I81 Portal vein thrombosis: Secondary | ICD-10-CM | POA: Diagnosis not present

## 2023-08-11 DIAGNOSIS — C787 Secondary malignant neoplasm of liver and intrahepatic bile duct: Secondary | ICD-10-CM

## 2023-08-11 DIAGNOSIS — C7801 Secondary malignant neoplasm of right lung: Secondary | ICD-10-CM | POA: Diagnosis not present

## 2023-08-11 DIAGNOSIS — D696 Thrombocytopenia, unspecified: Secondary | ICD-10-CM | POA: Diagnosis not present

## 2023-08-11 DIAGNOSIS — D759 Disease of blood and blood-forming organs, unspecified: Secondary | ICD-10-CM | POA: Diagnosis not present

## 2023-08-11 DIAGNOSIS — C7800 Secondary malignant neoplasm of unspecified lung: Secondary | ICD-10-CM

## 2023-08-11 DIAGNOSIS — C7971 Secondary malignant neoplasm of right adrenal gland: Secondary | ICD-10-CM | POA: Diagnosis not present

## 2023-08-11 DIAGNOSIS — C182 Malignant neoplasm of ascending colon: Secondary | ICD-10-CM

## 2023-08-11 DIAGNOSIS — C7931 Secondary malignant neoplasm of brain: Secondary | ICD-10-CM | POA: Diagnosis not present

## 2023-08-11 DIAGNOSIS — C7802 Secondary malignant neoplasm of left lung: Secondary | ICD-10-CM | POA: Diagnosis not present

## 2023-08-11 DIAGNOSIS — Z5189 Encounter for other specified aftercare: Secondary | ICD-10-CM | POA: Diagnosis not present

## 2023-08-11 MED ORDER — SODIUM CHLORIDE 0.9% IV SOLUTION
250.0000 mL | INTRAVENOUS | Status: DC
  Administered 2023-08-11: 250 mL via INTRAVENOUS

## 2023-08-11 MED ORDER — SODIUM CHLORIDE 0.9% FLUSH
10.0000 mL | INTRAVENOUS | Status: DC | PRN
  Administered 2023-08-11: 10 mL

## 2023-08-11 MED ORDER — HEPARIN SOD (PORK) LOCK FLUSH 100 UNIT/ML IV SOLN
500.0000 [IU] | Freq: Once | INTRAVENOUS | Status: AC | PRN
Start: 2023-08-11 — End: 2023-08-11
  Administered 2023-08-11: 500 [IU]

## 2023-08-11 NOTE — Patient Instructions (Signed)
Managing Low Blood Counts During Cancer Treatment Cancer treatments, such as chemotherapy and radiation, may cause a drop in the number of blood cells in the body. These blood cells include red blood cells, white blood cells, and platelets. They are made in the body and sent into the blood to do certain tasks. Red blood cells carry gases such as oxygen and carbon dioxide to and from your lungs. White blood cells help protect you from infection. Platelets help your body form blood clots to prevent and control bleeding. When there is a drop in blood cell counts, your body may not have enough cells to do what it needs to do. This can cause problems. If your blood counts are low, you can take steps to help manage problems. How can low blood counts affect me? The problems you may have from low blood counts will depend on which blood cells are affected. If you have a low number of red blood cells, you have a condition called anemia. This can cause you to feel tired, weak, light-headed, or short of breath. If you have a low number of white blood cells, you may be more at risk for infections. If you have a low number of platelets, you may bleed and bruise easily. Your body may also have trouble stopping any bleeding. How to manage symptoms or prevent problems from a low blood count If you have a low blood count, you can take steps to help decrease symptoms and prevent other problems. The steps you need to take will depend on which type of blood cell is low. Low red blood cells To help manage the symptoms of anemia: Go for a walk or do some light exercise each day. Take short naps during the day. Eat foods with a lot of iron and protein. These include leafy green vegetables, meat and fish, beans, sweet potatoes, and dried fruit. Ask for help with errands and with work that needs to be done around the house. Take vitamins or supplements only as told by your health care provider. Find ways to relax. These  may include yoga or meditation.  Low white blood cells To help prevent infections: Keep your body clean. Make sure you: Wash your hands often with warm water and soap. If you get a scrape or cut, clean it right away. Clean yourself well after you use the bathroom. Tell your provider if you have any rectal sores or bleeding. Avoid contact with pet waste. Wash your hands after handling pets. Do not swim or wade in lakes, ponds, rivers, water parks, or hot tubs. Stay away from crowds of people and any person who has the flu or a fever. To protect yourself, you may need to wear a mask when you are around other people. People should be fever-free for 24 hours before you see them. Avoid dental work that you do not need. Check your mouth each day for sores or signs of infection. Do not share utensils. Avoid fresh flowers and plants or dried flowers. Follow food safety guidelines. Cook meat well and wash all raw fruits and vegetables.  Low platelets To help prevent or control bleeding and bruising: Use an electric razor for shaving instead of a blade. Use a soft toothbrush. Be careful when you clean your mouth and teeth. Ask your cancer care team whether you should avoid flossing. If your mouth is bleeding, rinse it with ice water. Avoid activities that could cause injury, such as contact sports. Talk with your provider about using   stool softeners to avoid constipation and straining during bowel movements. Do not use medicines such as ibuprofen, aspirin, or naproxen unless your provider tells you to. Limit alcohol use. If you drink alcohol: Limit how much you have to: 0-1 drink a day if you are female. 0-2 drinks a day if you are female. Know how much alcohol is in your drink. In the [place] one drink equals one 12 oz bottle of beer (355 mL), one 5 oz glass of wine (148 mL), or one 1 oz glass of hard liquor (44 mL). Monitor any bleeding. If you start bleeding, hold pressure on the area for 5 minutes  to stop the bleeding. Bleeding that does not stop is an emergency. What treatments can help increase a low blood count? Treatment that can help increase a low blood count will depend on the type of blood cell that is low and how severe your condition is. If told by your provider, you may need to: Take medicines to help promote the growth of white or red blood cells. You may also need to take iron, folic acid, or vitamin B12 supplements. Make changes to your diet. You may need more iron and protein. This can help your body make more red blood cells. Adjust your medicines or treatment plan to help raise blood counts. Have a blood transfusion. This may be done if your blood count is very low. Where to find more information American [employer] Society: cancer.org National Cancer Institute: cancer.gov Contact a health care provider if: You feel very tired and weak. You have more bruising or bleeding. You feel ill or get a cough. It hurts when you urinate, or you have blood in your urine or stool. You have pain in your abdomen, or you have diarrhea. You plan to take any new supplements or vitamins or make changes to your diet. You have any signs of an infection such as: Mouth sores or a sore throat. Swelling or redness. Fever or chills. A temperature of 100.4F (38C) or higher when you have low white blood cells. Get help right away if: You are short of breath or feel dizzy. You have bleeding that will not stop. This information is not intended to replace advice given to you by your health care provider. Make sure you discuss any questions you have with your health care provider. Document Revised: 12/10/2021 Document Reviewed: 12/10/2021 Elsevier Patient Education  2024 [employer]  

## 2023-08-12 LAB — BPAM PLATELET PHERESIS
Blood Product Expiration Date: 202503122359
Blood Product Expiration Date: 202503142359
ISSUE DATE / TIME: 202503121126
Unit Type and Rh: 5100
Unit Type and Rh: 8400

## 2023-08-12 LAB — PREPARE PLATELET PHERESIS
Unit division: 0
Unit division: 0

## 2023-08-13 ENCOUNTER — Other Ambulatory Visit: Payer: Self-pay

## 2023-08-13 DIAGNOSIS — F333 Major depressive disorder, recurrent, severe with psychotic symptoms: Secondary | ICD-10-CM

## 2023-08-13 DIAGNOSIS — G3184 Mild cognitive impairment, so stated: Secondary | ICD-10-CM

## 2023-08-13 DIAGNOSIS — R530 Neoplastic (malignant) related fatigue: Secondary | ICD-10-CM

## 2023-08-13 MED ORDER — MODAFINIL 200 MG PO TABS: ORAL_TABLET | ORAL | 0 refills | Status: DC

## 2023-08-13 NOTE — Telephone Encounter (Signed)
 [facility]

## 2023-08-13 NOTE — Telephone Encounter (Signed)
 Shannon [name] called again today needing the refill of her Modafinil.  She called 3/10.  She said she is out and that is time for the refill.  Appt 4/15. [facility] [place] [facility] [address]

## 2023-08-16 ENCOUNTER — Inpatient Hospital Stay

## 2023-08-16 VITALS — BP 126/85 | HR 94 | Temp 98.2°F | Resp 20 | Ht 68.0 in | Wt 152.0 lb

## 2023-08-16 DIAGNOSIS — I81 Portal vein thrombosis: Secondary | ICD-10-CM | POA: Diagnosis not present

## 2023-08-16 DIAGNOSIS — D696 Thrombocytopenia, unspecified: Secondary | ICD-10-CM | POA: Diagnosis not present

## 2023-08-16 DIAGNOSIS — C182 Malignant neoplasm of ascending colon: Secondary | ICD-10-CM

## 2023-08-16 DIAGNOSIS — C787 Secondary malignant neoplasm of liver and intrahepatic bile duct: Secondary | ICD-10-CM | POA: Diagnosis not present

## 2023-08-16 DIAGNOSIS — C189 Malignant neoplasm of colon, unspecified: Secondary | ICD-10-CM | POA: Diagnosis not present

## 2023-08-16 DIAGNOSIS — C7931 Secondary malignant neoplasm of brain: Secondary | ICD-10-CM | POA: Diagnosis not present

## 2023-08-16 DIAGNOSIS — C7800 Secondary malignant neoplasm of unspecified lung: Secondary | ICD-10-CM

## 2023-08-16 DIAGNOSIS — C7801 Secondary malignant neoplasm of right lung: Secondary | ICD-10-CM | POA: Diagnosis not present

## 2023-08-16 DIAGNOSIS — D759 Disease of blood and blood-forming organs, unspecified: Secondary | ICD-10-CM | POA: Diagnosis not present

## 2023-08-16 DIAGNOSIS — C7802 Secondary malignant neoplasm of left lung: Secondary | ICD-10-CM | POA: Diagnosis not present

## 2023-08-16 DIAGNOSIS — Z5189 Encounter for other specified aftercare: Secondary | ICD-10-CM | POA: Diagnosis not present

## 2023-08-16 DIAGNOSIS — Z5111 Encounter for antineoplastic chemotherapy: Secondary | ICD-10-CM | POA: Diagnosis not present

## 2023-08-16 DIAGNOSIS — C786 Secondary malignant neoplasm of retroperitoneum and peritoneum: Secondary | ICD-10-CM | POA: Diagnosis not present

## 2023-08-16 DIAGNOSIS — C7971 Secondary malignant neoplasm of right adrenal gland: Secondary | ICD-10-CM | POA: Diagnosis not present

## 2023-08-16 MED ORDER — FILGRASTIM-SNDZ 480 MCG/0.8ML IJ SOSY
480.0000 ug | PREFILLED_SYRINGE | Freq: Once | INTRAMUSCULAR | Status: AC
  Administered 2023-08-16: 480 ug via SUBCUTANEOUS
  Filled 2023-08-16: qty 0.8

## 2023-08-16 NOTE — Patient Instructions (Signed)
 Filgrastim Injection What is this medication? FILGRASTIM (fil GRA stim) lowers the risk of infection in people who are receiving chemotherapy. It works by Systems analyst make more white blood cells, which protects your body from infection. It may also be used to help people who have been exposed to high doses of radiation. It can be used to help prepare your body before a stem cell transplant. It works by helping your bone marrow make and release stem cells into the blood. This medicine may be used for other purposes; ask your health care provider or pharmacist if you have questions. COMMON BRAND NAME(S): Neupogen, Nivestym, Nypozi, Releuko, Zarxio What should I tell my care team before I take this medication? They need to know if you have any of these conditions: History of blood diseases, such as sickle cell anemia Kidney disease Recent or ongoing radiation An unusual or allergic reaction to filgrastim, pegfilgrastim, latex, rubber, other medications, foods, dyes, or preservatives Pregnant or trying to get pregnant Breast-feeding How should I use this medication? This medication is injected under the skin or into a vein. It is usually given by your care team in a hospital or clinic setting. It may be given at home. If you get this medication at home, you will be taught how to prepare and give it. Use exactly as directed. Take it as directed on the prescription label at the same time every day. Keep taking it unless your care team tells you to stop. It is important that you put your used needles and syringes in a special sharps container. Do not put them in a trash can. If you do not have a sharps container, call your pharmacist or care team to get one. This medication comes with INSTRUCTIONS FOR USE. Ask your pharmacist for directions on how to use this medication. Read the information carefully. Talk to your pharmacist or care team if you have questions. Talk to your care team about the use of  this medication in children. While it may be prescribed for children for selected conditions, precautions do apply. Overdosage: If you think you have taken too much of this medicine contact a poison control center or emergency room at once. NOTE: This medicine is only for you. Do not share this medicine with others. What if I miss a dose? It is important not to miss any doses. Talk to your care team about what to do if you miss a dose. What may interact with this medication? Medications that may cause a release of neutrophils, such as lithium This list may not describe all possible interactions. Give your health care provider a list of all the medicines, herbs, non-prescription drugs, or dietary supplements you use. Also tell them if you smoke, drink alcohol, or use illegal drugs. Some items may interact with your medicine. What should I watch for while using this medication? Your condition will be monitored carefully while you are receiving this medication. You may need bloodwork while taking this medication. Talk to your care team about your risk of cancer. You may be more at risk for certain types of cancer if you take this medication. What side effects may I notice from receiving this medication? Side effects that you should report to your care team as soon as possible: Allergic reactions--skin rash, itching, hives, swelling of the face, lips, tongue, or throat Capillary leak syndrome--stomach or muscle pain, unusual weakness or fatigue, feeling faint or lightheaded, decrease in the amount of urine, swelling of the ankles, hands,  or feet, trouble breathing High white blood cell level--fever, fatigue, trouble breathing, night sweats, change in vision, weight loss Inflammation of the aorta--fever, fatigue, back, chest, or stomach pain, severe headache Kidney injury (glomerulonephritis)--decrease in the amount of urine, red or dark brown urine, foamy or bubbly urine, swelling of the ankles, hands,  or feet Shortness of breath or trouble breathing Spleen injury--pain in upper left stomach or shoulder Unusual bruising or bleeding Side effects that usually do not require medical attention (report to your care team if they continue or are bothersome): Back pain Bone pain Fatigue Fever Headache Nausea This list may not describe all possible side effects. Call your doctor for medical advice about side effects. You may report side effects to FDA at 1-800-FDA-1088. Where should I keep my medication? Keep out of the reach of children and pets. Keep this medication in the original packaging until you are ready to take it. Protect from light. See product for storage information. Each product may have different instructions. Get rid of any unused medication after the expiration date. To get rid of medications that are no longer needed or have expired: Take the medication to a medications take-back program. Check with your pharmacy or law enforcement to find a location. If you cannot return the medication, ask your pharmacist or care team how to get rid of this medication safely. NOTE: This sheet is a summary. It may not cover all possible information. If you have questions about this medicine, talk to your doctor, pharmacist, or health care provider.  2024 Elsevier/[employer] Standard (2021-10-09 00:00:00)

## 2023-08-17 ENCOUNTER — Inpatient Hospital Stay

## 2023-08-17 VITALS — BP 128/84 | HR 106 | Temp 97.7°F | Resp 20

## 2023-08-17 DIAGNOSIS — Z5111 Encounter for antineoplastic chemotherapy: Secondary | ICD-10-CM | POA: Diagnosis not present

## 2023-08-17 DIAGNOSIS — C182 Malignant neoplasm of ascending colon: Secondary | ICD-10-CM

## 2023-08-17 DIAGNOSIS — D759 Disease of blood and blood-forming organs, unspecified: Secondary | ICD-10-CM | POA: Diagnosis not present

## 2023-08-17 DIAGNOSIS — C7800 Secondary malignant neoplasm of unspecified lung: Secondary | ICD-10-CM

## 2023-08-17 DIAGNOSIS — C189 Malignant neoplasm of colon, unspecified: Secondary | ICD-10-CM

## 2023-08-17 DIAGNOSIS — D696 Thrombocytopenia, unspecified: Secondary | ICD-10-CM | POA: Diagnosis not present

## 2023-08-17 DIAGNOSIS — C787 Secondary malignant neoplasm of liver and intrahepatic bile duct: Secondary | ICD-10-CM | POA: Diagnosis not present

## 2023-08-17 DIAGNOSIS — C7802 Secondary malignant neoplasm of left lung: Secondary | ICD-10-CM | POA: Diagnosis not present

## 2023-08-17 DIAGNOSIS — C7971 Secondary malignant neoplasm of right adrenal gland: Secondary | ICD-10-CM | POA: Diagnosis not present

## 2023-08-17 DIAGNOSIS — C7801 Secondary malignant neoplasm of right lung: Secondary | ICD-10-CM | POA: Diagnosis not present

## 2023-08-17 DIAGNOSIS — C786 Secondary malignant neoplasm of retroperitoneum and peritoneum: Secondary | ICD-10-CM | POA: Diagnosis not present

## 2023-08-17 DIAGNOSIS — Z5189 Encounter for other specified aftercare: Secondary | ICD-10-CM | POA: Diagnosis not present

## 2023-08-17 DIAGNOSIS — I81 Portal vein thrombosis: Secondary | ICD-10-CM | POA: Diagnosis not present

## 2023-08-17 DIAGNOSIS — C7931 Secondary malignant neoplasm of brain: Secondary | ICD-10-CM | POA: Diagnosis not present

## 2023-08-17 MED ORDER — FILGRASTIM-SNDZ 480 MCG/0.8ML IJ SOSY
480.0000 ug | PREFILLED_SYRINGE | Freq: Once | INTRAMUSCULAR | Status: AC
  Administered 2023-08-17: 480 ug via SUBCUTANEOUS
  Filled 2023-08-17: qty 0.8

## 2023-08-17 NOTE — Patient Instructions (Signed)
 Filgrastim Injection What is this medication? FILGRASTIM (fil GRA stim) lowers the risk of infection in people who are receiving chemotherapy. It works by Systems analyst make more white blood cells, which protects your body from infection. It may also be used to help people who have been exposed to high doses of radiation. It can be used to help prepare your body before a stem cell transplant. It works by helping your bone marrow make and release stem cells into the blood. This medicine may be used for other purposes; ask your health care provider or pharmacist if you have questions. COMMON BRAND NAME(S): Neupogen, Nivestym, Nypozi, Releuko, Zarxio What should I tell my care team before I take this medication? They need to know if you have any of these conditions: History of blood diseases, such as sickle cell anemia Kidney disease Recent or ongoing radiation An unusual or allergic reaction to filgrastim, pegfilgrastim, latex, rubber, other medications, foods, dyes, or preservatives Pregnant or trying to get pregnant Breast-feeding How should I use this medication? This medication is injected under the skin or into a vein. It is usually given by your care team in a hospital or clinic setting. It may be given at home. If you get this medication at home, you will be taught how to prepare and give it. Use exactly as directed. Take it as directed on the prescription label at the same time every day. Keep taking it unless your care team tells you to stop. It is important that you put your used needles and syringes in a special sharps container. Do not put them in a trash can. If you do not have a sharps container, call your pharmacist or care team to get one. This medication comes with INSTRUCTIONS FOR USE. Ask your pharmacist for directions on how to use this medication. Read the information carefully. Talk to your pharmacist or care team if you have questions. Talk to your care team about the use of  this medication in children. While it may be prescribed for children for selected conditions, precautions do apply. Overdosage: If you think you have taken too much of this medicine contact a poison control center or emergency room at once. NOTE: This medicine is only for you. Do not share this medicine with others. What if I miss a dose? It is important not to miss any doses. Talk to your care team about what to do if you miss a dose. What may interact with this medication? Medications that may cause a release of neutrophils, such as lithium This list may not describe all possible interactions. Give your health care provider a list of all the medicines, herbs, non-prescription drugs, or dietary supplements you use. Also tell them if you smoke, drink alcohol, or use illegal drugs. Some items may interact with your medicine. What should I watch for while using this medication? Your condition will be monitored carefully while you are receiving this medication. You may need bloodwork while taking this medication. Talk to your care team about your risk of cancer. You may be more at risk for certain types of cancer if you take this medication. What side effects may I notice from receiving this medication? Side effects that you should report to your care team as soon as possible: Allergic reactions--skin rash, itching, hives, swelling of the face, lips, tongue, or throat Capillary leak syndrome--stomach or muscle pain, unusual weakness or fatigue, feeling faint or lightheaded, decrease in the amount of urine, swelling of the ankles, hands,  or feet, trouble breathing High white blood cell level--fever, fatigue, trouble breathing, night sweats, change in vision, weight loss Inflammation of the aorta--fever, fatigue, back, chest, or stomach pain, severe headache Kidney injury (glomerulonephritis)--decrease in the amount of urine, red or dark brown urine, foamy or bubbly urine, swelling of the ankles, hands,  or feet Shortness of breath or trouble breathing Spleen injury--pain in upper left stomach or shoulder Unusual bruising or bleeding Side effects that usually do not require medical attention (report to your care team if they continue or are bothersome): Back pain Bone pain Fatigue Fever Headache Nausea This list may not describe all possible side effects. Call your doctor for medical advice about side effects. You may report side effects to FDA at 1-800-FDA-1088. Where should I keep my medication? Keep out of the reach of children and pets. Keep this medication in the original packaging until you are ready to take it. Protect from light. See product for storage information. Each product may have different instructions. Get rid of any unused medication after the expiration date. To get rid of medications that are no longer needed or have expired: Take the medication to a medications take-back program. Check with your pharmacy or law enforcement to find a location. If you cannot return the medication, ask your pharmacist or care team how to get rid of this medication safely. NOTE: This sheet is a summary. It may not cover all possible information. If you have questions about this medicine, talk to your doctor, pharmacist, or health care provider.  2024 Elsevier/[employer] Standard (2021-10-09 00:00:00)

## 2023-08-18 ENCOUNTER — Inpatient Hospital Stay

## 2023-08-18 VITALS — BP 95/71 | HR 97 | Temp 97.7°F | Resp 20

## 2023-08-18 DIAGNOSIS — D759 Disease of blood and blood-forming organs, unspecified: Secondary | ICD-10-CM | POA: Diagnosis not present

## 2023-08-18 DIAGNOSIS — C182 Malignant neoplasm of ascending colon: Secondary | ICD-10-CM

## 2023-08-18 DIAGNOSIS — C7971 Secondary malignant neoplasm of right adrenal gland: Secondary | ICD-10-CM | POA: Diagnosis not present

## 2023-08-18 DIAGNOSIS — Z5111 Encounter for antineoplastic chemotherapy: Secondary | ICD-10-CM | POA: Diagnosis not present

## 2023-08-18 DIAGNOSIS — C189 Malignant neoplasm of colon, unspecified: Secondary | ICD-10-CM | POA: Diagnosis not present

## 2023-08-18 DIAGNOSIS — D696 Thrombocytopenia, unspecified: Secondary | ICD-10-CM | POA: Diagnosis not present

## 2023-08-18 DIAGNOSIS — C7931 Secondary malignant neoplasm of brain: Secondary | ICD-10-CM | POA: Diagnosis not present

## 2023-08-18 DIAGNOSIS — C786 Secondary malignant neoplasm of retroperitoneum and peritoneum: Secondary | ICD-10-CM | POA: Diagnosis not present

## 2023-08-18 DIAGNOSIS — I81 Portal vein thrombosis: Secondary | ICD-10-CM | POA: Diagnosis not present

## 2023-08-18 DIAGNOSIS — C787 Secondary malignant neoplasm of liver and intrahepatic bile duct: Secondary | ICD-10-CM

## 2023-08-18 DIAGNOSIS — Z5189 Encounter for other specified aftercare: Secondary | ICD-10-CM | POA: Diagnosis not present

## 2023-08-18 DIAGNOSIS — C7801 Secondary malignant neoplasm of right lung: Secondary | ICD-10-CM | POA: Diagnosis not present

## 2023-08-18 DIAGNOSIS — C7802 Secondary malignant neoplasm of left lung: Secondary | ICD-10-CM | POA: Diagnosis not present

## 2023-08-18 MED ORDER — FILGRASTIM-SNDZ 480 MCG/0.8ML IJ SOSY
480.0000 ug | PREFILLED_SYRINGE | Freq: Once | INTRAMUSCULAR | Status: AC
  Administered 2023-08-18: 480 ug via SUBCUTANEOUS

## 2023-08-18 NOTE — Patient Instructions (Signed)
 Filgrastim Injection What is this medication? FILGRASTIM (fil GRA stim) lowers the risk of infection in people who are receiving chemotherapy. It works by Systems analyst make more white blood cells, which protects your body from infection. It may also be used to help people who have been exposed to high doses of radiation. It can be used to help prepare your body before a stem cell transplant. It works by helping your bone marrow make and release stem cells into the blood. This medicine may be used for other purposes; ask your health care provider or pharmacist if you have questions. COMMON BRAND NAME(S): Neupogen, Nivestym, Nypozi, Releuko, Zarxio What should I tell my care team before I take this medication? They need to know if you have any of these conditions: History of blood diseases, such as sickle cell anemia Kidney disease Recent or ongoing radiation An unusual or allergic reaction to filgrastim, pegfilgrastim, latex, rubber, other medications, foods, dyes, or preservatives Pregnant or trying to get pregnant Breast-feeding How should I use this medication? This medication is injected under the skin or into a vein. It is usually given by your care team in a hospital or clinic setting. It may be given at home. If you get this medication at home, you will be taught how to prepare and give it. Use exactly as directed. Take it as directed on the prescription label at the same time every day. Keep taking it unless your care team tells you to stop. It is important that you put your used needles and syringes in a special sharps container. Do not put them in a trash can. If you do not have a sharps container, call your pharmacist or care team to get one. This medication comes with INSTRUCTIONS FOR USE. Ask your pharmacist for directions on how to use this medication. Read the information carefully. Talk to your pharmacist or care team if you have questions. Talk to your care team about the use of  this medication in children. While it may be prescribed for children for selected conditions, precautions do apply. Overdosage: If you think you have taken too much of this medicine contact a poison control center or emergency room at once. NOTE: This medicine is only for you. Do not share this medicine with others. What if I miss a dose? It is important not to miss any doses. Talk to your care team about what to do if you miss a dose. What may interact with this medication? Medications that may cause a release of neutrophils, such as lithium This list may not describe all possible interactions. Give your health care provider a list of all the medicines, herbs, non-prescription drugs, or dietary supplements you use. Also tell them if you smoke, drink alcohol, or use illegal drugs. Some items may interact with your medicine. What should I watch for while using this medication? Your condition will be monitored carefully while you are receiving this medication. You may need bloodwork while taking this medication. Talk to your care team about your risk of cancer. You may be more at risk for certain types of cancer if you take this medication. What side effects may I notice from receiving this medication? Side effects that you should report to your care team as soon as possible: Allergic reactions--skin rash, itching, hives, swelling of the face, lips, tongue, or throat Capillary leak syndrome--stomach or muscle pain, unusual weakness or fatigue, feeling faint or lightheaded, decrease in the amount of urine, swelling of the ankles, hands,  or feet, trouble breathing High white blood cell level--fever, fatigue, trouble breathing, night sweats, change in vision, weight loss Inflammation of the aorta--fever, fatigue, back, chest, or stomach pain, severe headache Kidney injury (glomerulonephritis)--decrease in the amount of urine, red or dark brown urine, foamy or bubbly urine, swelling of the ankles, hands,  or feet Shortness of breath or trouble breathing Spleen injury--pain in upper left stomach or shoulder Unusual bruising or bleeding Side effects that usually do not require medical attention (report to your care team if they continue or are bothersome): Back pain Bone pain Fatigue Fever Headache Nausea This list may not describe all possible side effects. Call your doctor for medical advice about side effects. You may report side effects to FDA at 1-800-FDA-1088. Where should I keep my medication? Keep out of the reach of children and pets. Keep this medication in the original packaging until you are ready to take it. Protect from light. See product for storage information. Each product may have different instructions. Get rid of any unused medication after the expiration date. To get rid of medications that are no longer needed or have expired: Take the medication to a medications take-back program. Check with your pharmacy or law enforcement to find a location. If you cannot return the medication, ask your pharmacist or care team how to get rid of this medication safely. NOTE: This sheet is a summary. It may not cover all possible information. If you have questions about this medicine, talk to your doctor, pharmacist, or health care provider.  2024 Elsevier/[employer] Standard (2021-10-09 00:00:00)

## 2023-08-22 ENCOUNTER — Telehealth: Payer: Self-pay

## 2023-08-22 NOTE — Telephone Encounter (Signed)
 Prior Authorization submitted for Modafinil 200 mg with [employer], approval received effective 08/22/23-08/24/24

## 2023-08-23 ENCOUNTER — Ambulatory Visit

## 2023-08-23 ENCOUNTER — Ambulatory Visit: Admitting: Oncology

## 2023-08-23 ENCOUNTER — Other Ambulatory Visit

## 2023-08-25 ENCOUNTER — Encounter

## 2023-08-29 NOTE — Progress Notes (Unsigned)
 [facility]  [address] [place],  [place]  [address] [phone]  Clinic Day:  08/30/2023  Referring physician: Alinda Deem, MD  HISTORY OF PRESENT ILLNESS:  The patient is a 58 y.o. female with  metastatic colon cancer, which includes spread of disease to her abdominal cavity, lungs, brain and right adrenal gland.  She comes in today to be evaluated before heading into her 22nd cycle of  FOLFOX/Avastin.  Of note, her cycles of chemotherapy have been spaced out to every 3 weeks.  Since her last visit, the patient has been doing fairly well.  She recalls having intermittent abdominal pain, nausea, and vomiting a few weeks ago, which has all dissipated.  However, fatigue has been more of an issue.  Otherwise, she denies having any new symptoms or findings which concern her for overt signs of disease progression.    With respect to her colon cancer history, she is status post a right hemicolectomy in early October 2018, followed by 12 cycles of adjuvant FOLFOX chemotherapy, which were completed in April 2019.  In December 2021, she underwent a left cerebellar metastasectomy, whose pathology was consistent with metastatic colon cancer.  Tumor testing did come back MMR normal. CT scans revealed evidence of her cancer being in multiple locations, for which she took 40 cycles of FOLFIRI/Avastin.  As follow-up scans revealed liver metastasis, she was placed back on FOLFOX/Avastin, for which she continues to take.  PHYSICAL EXAM:  Blood pressure 111/70, pulse 89, temperature 98.4 F (36.9 C), temperature source Oral, resp. rate 14, height 5\' 8"  (1.727 m), weight 150 lb (68 kg), last menstrual period 02/22/2019, SpO2 95%. Body mass index is 22.81 kg/m.  Performance status (ECOG): 1 - Symptomatic but completely ambulatory  Physical Exam Vitals and nursing note reviewed.  Constitutional:      General: She is not in acute distress.    Appearance: Normal  appearance.  HENT:     Head: Normocephalic and atraumatic.     Mouth/Throat:     Mouth: Mucous membranes are moist.     Pharynx: Oropharynx is clear. No oropharyngeal exudate or posterior oropharyngeal erythema.  Eyes:     General: No scleral icterus.    Extraocular Movements: Extraocular movements intact.     Conjunctiva/sclera: Conjunctivae normal.     Pupils: Pupils are equal, round, and reactive to light.  Cardiovascular:     Rate and Rhythm: Normal rate and regular rhythm.     Heart sounds: Normal heart sounds. No murmur heard.    No friction rub. No gallop.  Pulmonary:     Effort: Pulmonary effort is normal.     Breath sounds: Normal breath sounds. No wheezing, rhonchi or rales.  Abdominal:     General: There is no distension.     Palpations: Abdomen is soft. There is no hepatomegaly, splenomegaly or mass.     Tenderness: There is no abdominal tenderness.  Musculoskeletal:        General: Normal range of motion.     Cervical back: Normal range of motion and neck supple. No tenderness.     Right lower leg: No edema.     Left lower leg: No edema.  Lymphadenopathy:     Cervical: No cervical adenopathy.     Upper Body:     Right upper body: No supraclavicular or axillary adenopathy.     Left upper body: No supraclavicular or axillary adenopathy.     Lower Body: No right inguinal adenopathy.  No left inguinal adenopathy.  Skin:    General: Skin is warm and dry.     Coloration: Skin is not jaundiced.     Findings: No rash.  Neurological:     Mental Status: She is alert and oriented to person, place, and time.     Cranial Nerves: No cranial nerve deficit.  Psychiatric:        Mood and Affect: Mood normal.        Behavior: Behavior normal.        Thought Content: Thought content normal.   LABS:      Latest Ref Rng & Units 08/30/2023    9:23 AM 08/09/2023    9:41 AM 08/02/2023   11:50 AM  CBC  WBC 4.0 - 10.5 K/uL 2.9  3.2  2.1   Hemoglobin 12.0 - 15.0 g/dL 16.1  09.6   9.2   Hematocrit 36.0 - 46.0 % 35.8  38.4  28.6   Platelets 150 - 400 K/uL 41  48  35       Latest Ref Rng & Units 08/30/2023    9:23 AM 08/09/2023    9:41 AM 08/02/2023   11:50 AM  CMP  Glucose 70 - 99 mg/dL 045  409  811   BUN 6 - 20 mg/dL 12  15  12    Creatinine 0.44 - 1.00 mg/dL 9.14  7.82  9.56   Sodium 135 - 145 mmol/L 141  143  145   Potassium 3.5 - 5.1 mmol/L 4.4  4.2  3.4   Chloride 98 - 111 mmol/L 102  105  114   CO2 22 - 32 mmol/L 29  28  23    Calcium 8.9 - 10.3 mg/dL 9.4  9.5  7.0   Total Protein 6.5 - 8.1 g/dL 5.7  6.3  4.3   Total Bilirubin 0.0 - 1.2 mg/dL 0.4  0.3  0.3   Alkaline Phos 38 - 126 U/L 198  228  121   AST 15 - 41 U/L 37  32  16   ALT 0 - 44 U/L 42  19  11    ASSESSMENT & PLAN:  Assessment/Plan:  A 58 y.o. female with metastatic colon cancer. She will proceed with her 22nd cycle of FOLFOX/Avastin today, which she now receives every 3 weeks.  With respect to her thrombocytopenia, I will give her 1 unit of platelets this week.  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  Due to her level of thrombocytopenia, anticoagulation has been held.  As interventional radiology requested scans to be repeated to see if this thrombus has improved, I will arrange for another CT scan of her abdomen/pelvis to be done in the forthcoming weeks.  If still prominent, some form of thrombectomy would need to be considered.  Overall, the patient appears to be doing well.  I will see her back in 3 weeks before she heads into her 23rd of cycle of FOLFOX/Avastin.  The patient understands all the plans discussed today and is in agreement with them.  Kamaal Cast Kirby Funk, MD

## 2023-08-30 ENCOUNTER — Other Ambulatory Visit: Payer: Self-pay | Admitting: Oncology

## 2023-08-30 ENCOUNTER — Encounter: Payer: Self-pay | Admitting: Oncology

## 2023-08-30 ENCOUNTER — Inpatient Hospital Stay

## 2023-08-30 ENCOUNTER — Telehealth: Payer: Self-pay | Admitting: Oncology

## 2023-08-30 ENCOUNTER — Inpatient Hospital Stay (HOSPITAL_BASED_OUTPATIENT_CLINIC_OR_DEPARTMENT_OTHER): Admitting: Oncology

## 2023-08-30 ENCOUNTER — Ambulatory Visit

## 2023-08-30 VITALS — BP 111/70 | HR 89 | Temp 98.4°F | Resp 14 | Ht 68.0 in | Wt 150.0 lb

## 2023-08-30 DIAGNOSIS — C786 Secondary malignant neoplasm of retroperitoneum and peritoneum: Secondary | ICD-10-CM | POA: Diagnosis not present

## 2023-08-30 DIAGNOSIS — C7802 Secondary malignant neoplasm of left lung: Secondary | ICD-10-CM | POA: Diagnosis not present

## 2023-08-30 DIAGNOSIS — C189 Malignant neoplasm of colon, unspecified: Secondary | ICD-10-CM

## 2023-08-30 DIAGNOSIS — C182 Malignant neoplasm of ascending colon: Secondary | ICD-10-CM | POA: Diagnosis not present

## 2023-08-30 DIAGNOSIS — C7931 Secondary malignant neoplasm of brain: Secondary | ICD-10-CM | POA: Diagnosis not present

## 2023-08-30 DIAGNOSIS — Z5189 Encounter for other specified aftercare: Secondary | ICD-10-CM | POA: Diagnosis not present

## 2023-08-30 DIAGNOSIS — D759 Disease of blood and blood-forming organs, unspecified: Secondary | ICD-10-CM | POA: Diagnosis not present

## 2023-08-30 DIAGNOSIS — Z5111 Encounter for antineoplastic chemotherapy: Secondary | ICD-10-CM | POA: Diagnosis not present

## 2023-08-30 DIAGNOSIS — C787 Secondary malignant neoplasm of liver and intrahepatic bile duct: Secondary | ICD-10-CM | POA: Diagnosis not present

## 2023-08-30 DIAGNOSIS — D696 Thrombocytopenia, unspecified: Secondary | ICD-10-CM | POA: Diagnosis not present

## 2023-08-30 DIAGNOSIS — C7971 Secondary malignant neoplasm of right adrenal gland: Secondary | ICD-10-CM | POA: Diagnosis not present

## 2023-08-30 DIAGNOSIS — C7801 Secondary malignant neoplasm of right lung: Secondary | ICD-10-CM | POA: Diagnosis not present

## 2023-08-30 DIAGNOSIS — I81 Portal vein thrombosis: Secondary | ICD-10-CM | POA: Diagnosis not present

## 2023-08-30 LAB — CMP (CANCER CENTER ONLY)
ALT: 42 U/L (ref 0–44)
AST: 37 U/L (ref 15–41)
Albumin: 3.6 g/dL (ref 3.5–5.0)
Alkaline Phosphatase: 198 U/L — ABNORMAL HIGH (ref 38–126)
Anion gap: 10 (ref 5–15)
BUN: 12 mg/dL (ref 6–20)
CO2: 29 mmol/L (ref 22–32)
Calcium: 9.4 mg/dL (ref 8.9–10.3)
Chloride: 102 mmol/L (ref 98–111)
Creatinine: 1.24 mg/dL — ABNORMAL HIGH (ref 0.44–1.00)
GFR, Estimated: 51 mL/min — ABNORMAL LOW (ref >60)
Glucose, Bld: 226 mg/dL — ABNORMAL HIGH (ref 70–99)
Potassium: 4.4 mmol/L (ref 3.5–5.1)
Sodium: 141 mmol/L (ref 135–145)
Total Bilirubin: 0.4 mg/dL (ref 0.0–1.2)
Total Protein: 5.7 g/dL — ABNORMAL LOW (ref 6.5–8.1)

## 2023-08-30 LAB — CBC WITH DIFFERENTIAL (CANCER CENTER ONLY)
Abs Immature Granulocytes: 0.01 10*3/uL (ref 0.00–0.07)
Basophils Absolute: 0 10*3/uL (ref 0.0–0.1)
Basophils Relative: 0 %
Eosinophils Absolute: 0.1 10*3/uL (ref 0.0–0.5)
Eosinophils Relative: 2 %
HCT: 35.8 % — ABNORMAL LOW (ref 36.0–46.0)
Hemoglobin: 11.5 g/dL — ABNORMAL LOW (ref 12.0–15.0)
Immature Granulocytes: 0 %
Immature Platelet Fraction: 2.1 % (ref 1.2–8.6)
Lymphocytes Relative: 35 %
Lymphs Abs: 1 10*3/uL (ref 0.7–4.0)
MCH: 32.6 pg (ref 26.0–34.0)
MCHC: 32.1 g/dL (ref 30.0–36.0)
MCV: 101.4 fL — ABNORMAL HIGH (ref 80.0–100.0)
Monocytes Absolute: 0.3 10*3/uL (ref 0.1–1.0)
Monocytes Relative: 10 %
Neutro Abs: 1.6 10*3/uL — ABNORMAL LOW (ref 1.7–7.7)
Neutrophils Relative %: 53 %
Platelet Count: 41 10*3/uL — ABNORMAL LOW (ref 150–400)
RBC: 3.53 MIL/uL — ABNORMAL LOW (ref 3.87–5.11)
RDW: 14.2 % (ref 11.5–15.5)
WBC Count: 2.9 10*3/uL — ABNORMAL LOW (ref 4.0–10.5)
nRBC: 0 % (ref 0.0–0.2)
nRBC: 0 /100{WBCs}

## 2023-08-30 LAB — TOTAL PROTEIN, URINE DIPSTICK: Protein, ur: NEGATIVE mg/dL

## 2023-08-30 MED ORDER — OXALIPLATIN CHEMO INJECTION 100 MG/20ML
63.7500 mg/m2 | Freq: Once | INTRAVENOUS | Status: AC
  Administered 2023-08-30: 115 mg via INTRAVENOUS
  Filled 2023-08-30: qty 23

## 2023-08-30 MED ORDER — DEXAMETHASONE SODIUM PHOSPHATE 10 MG/ML IJ SOLN
10.0000 mg | Freq: Once | INTRAMUSCULAR | Status: AC
  Administered 2023-08-30: 10 mg via INTRAVENOUS
  Filled 2023-08-30: qty 1

## 2023-08-30 MED ORDER — SODIUM CHLORIDE 0.9 % IV SOLN
150.0000 mg | Freq: Once | INTRAVENOUS | Status: AC
  Administered 2023-08-30: 150 mg via INTRAVENOUS
  Filled 2023-08-30: qty 150

## 2023-08-30 MED ORDER — FAMOTIDINE IN NACL 20-0.9 MG/50ML-% IV SOLN
20.0000 mg | Freq: Once | INTRAVENOUS | Status: AC
  Administered 2023-08-30: 20 mg via INTRAVENOUS
  Filled 2023-08-30: qty 50

## 2023-08-30 MED ORDER — FLUOROURACIL CHEMO INJECTION 2.5 GM/50ML
400.0000 mg/m2 | Freq: Once | INTRAVENOUS | Status: AC
  Administered 2023-08-30: 700 mg via INTRAVENOUS
  Filled 2023-08-30: qty 14

## 2023-08-30 MED ORDER — DEXTROSE 5 % IV SOLN: Freq: Once | INTRAVENOUS | Status: DC

## 2023-08-30 MED ORDER — DEXTROSE 5 % IV SOLN: Freq: Once | INTRAVENOUS | Status: AC

## 2023-08-30 MED ORDER — SODIUM CHLORIDE 0.9 % IV SOLN
2400.0000 mg/m2 | INTRAVENOUS | Status: DC
  Administered 2023-08-30: 4350 mg via INTRAVENOUS
  Filled 2023-08-30: qty 87

## 2023-08-30 MED ORDER — SODIUM CHLORIDE 0.9 % IV SOLN: Freq: Once | INTRAVENOUS | Status: AC

## 2023-08-30 MED ORDER — DIPHENHYDRAMINE HCL 50 MG/ML IJ SOLN
25.0000 mg | Freq: Once | INTRAMUSCULAR | Status: AC
  Administered 2023-08-30: 25 mg via INTRAVENOUS
  Filled 2023-08-30: qty 1

## 2023-08-30 MED ORDER — SODIUM CHLORIDE 0.9% FLUSH: 10.0000 mL | INTRAVENOUS | Status: DC | PRN

## 2023-08-30 MED ORDER — BEVACIZUMAB-AWWB CHEMO INJECTION 400 MG/16ML
5.0000 mg/kg | Freq: Once | INTRAVENOUS | Status: AC
  Administered 2023-08-30: 350 mg via INTRAVENOUS
  Filled 2023-08-30: qty 14

## 2023-08-30 MED ORDER — PALONOSETRON HCL INJECTION 0.25 MG/5ML
0.2500 mg | Freq: Once | INTRAVENOUS | Status: AC
Start: 2023-08-30 — End: 2023-08-30
  Administered 2023-08-30: 0.25 mg via INTRAVENOUS
  Filled 2023-08-30: qty 5

## 2023-08-30 MED ORDER — HEPARIN SOD (PORK) LOCK FLUSH 100 UNIT/ML IV SOLN: 500.0000 [IU] | Freq: Once | INTRAVENOUS | Status: DC | PRN

## 2023-08-30 NOTE — Progress Notes (Signed)
 Per Dr Melvyn Neth- "Ok to treat with platelets at 41"

## 2023-08-30 NOTE — Patient Instructions (Signed)
 The chemotherapy medication bag should finish at 46 hours. For example, if your pump is scheduled for 46 hours and it was put on at 4:00 p.m., it should finish at 2:00 p.m. the day it is scheduled to come off regardless of your appointment time.     Estimated time to finish at 1pm .   If the display on your pump reads "Low Volume" and it is beeping, take the batteries out of the pump and come to the cancer center for it to be taken off.   If the pump alarms go off prior to the pump reading "Low Volume" then call [phone] and someone can assist you.  If the plunger comes out and the chemotherapy medication is leaking out, please use your home chemo spill kit to clean up the spill. Do NOT use paper towels or other household products.  If you have problems or questions regarding your pump, please call either [phone] (24 hours a day) or the cancer center Monday-Friday 8:00 a.m.- 4:30 p.m. at the clinic number and we will assist you. If you are unable to get assistance, then go to the nearest Emergency Department and ask the staff to contact the IV team for assistance.

## 2023-08-30 NOTE — Telephone Encounter (Signed)
 Patient has been scheduled for follow-up visit per 08/30/23 LOS.  Pt given an appt calendar with date and time.

## 2023-08-31 ENCOUNTER — Inpatient Hospital Stay: Attending: Oncology

## 2023-08-31 ENCOUNTER — Ambulatory Visit

## 2023-08-31 DIAGNOSIS — Z5111 Encounter for antineoplastic chemotherapy: Secondary | ICD-10-CM | POA: Insufficient documentation

## 2023-08-31 DIAGNOSIS — Z5189 Encounter for other specified aftercare: Secondary | ICD-10-CM | POA: Diagnosis not present

## 2023-08-31 DIAGNOSIS — C787 Secondary malignant neoplasm of liver and intrahepatic bile duct: Secondary | ICD-10-CM | POA: Insufficient documentation

## 2023-08-31 DIAGNOSIS — D696 Thrombocytopenia, unspecified: Secondary | ICD-10-CM | POA: Diagnosis not present

## 2023-08-31 DIAGNOSIS — C7802 Secondary malignant neoplasm of left lung: Secondary | ICD-10-CM | POA: Diagnosis not present

## 2023-08-31 DIAGNOSIS — R112 Nausea with vomiting, unspecified: Secondary | ICD-10-CM | POA: Insufficient documentation

## 2023-08-31 DIAGNOSIS — C7931 Secondary malignant neoplasm of brain: Secondary | ICD-10-CM | POA: Diagnosis not present

## 2023-08-31 DIAGNOSIS — C189 Malignant neoplasm of colon, unspecified: Secondary | ICD-10-CM

## 2023-08-31 DIAGNOSIS — C786 Secondary malignant neoplasm of retroperitoneum and peritoneum: Secondary | ICD-10-CM | POA: Diagnosis not present

## 2023-08-31 DIAGNOSIS — C7971 Secondary malignant neoplasm of right adrenal gland: Secondary | ICD-10-CM | POA: Insufficient documentation

## 2023-08-31 DIAGNOSIS — C182 Malignant neoplasm of ascending colon: Secondary | ICD-10-CM

## 2023-08-31 DIAGNOSIS — C7801 Secondary malignant neoplasm of right lung: Secondary | ICD-10-CM | POA: Diagnosis not present

## 2023-08-31 DIAGNOSIS — I864 Gastric varices: Secondary | ICD-10-CM | POA: Diagnosis not present

## 2023-08-31 MED ORDER — SODIUM CHLORIDE 0.9% IV SOLUTION
250.0000 mL | INTRAVENOUS | Status: DC
  Administered 2023-08-31: 250 mL via INTRAVENOUS

## 2023-08-31 NOTE — Patient Instructions (Signed)
The chemotherapy medication bag should finish at 46 hours, 96 hours, or 7 days. For example, if your pump is scheduled for 46 hours and it was put on at 4:00 p.m., it should finish at 2:00 p.m. the day it is scheduled to come off regardless of your appointment time.     Estimated time to finish at 2pm .   If the display on your pump reads "Low Volume" and it is beeping, take the batteries out of the pump and come to the cancer center for it to be taken off.   If the pump alarms go off prior to the pump reading "Low Volume" then call [phone] and someone can assist you.  If the plunger comes out and the chemotherapy medication is leaking out, please use your home chemo spill kit to clean up the spill. Do NOT use paper towels or other household products.  If you have problems or questions regarding your pump, please call either [phone] (24 hours a day) or the cancer center Monday-Friday 8:00 a.m.- 4:30 p.m. at the clinic number and we will assist you. If you are unable to get assistance, then go to the nearest Emergency Department and ask the staff to contact the IV team for assistance.

## 2023-08-31 NOTE — Progress Notes (Signed)
 1440- OK to transfuse B positive platelets with patient's A pos status per blood bank staff

## 2023-09-01 ENCOUNTER — Encounter: Payer: Self-pay | Admitting: Oncology

## 2023-09-01 ENCOUNTER — Ambulatory Visit

## 2023-09-01 ENCOUNTER — Inpatient Hospital Stay

## 2023-09-01 VITALS — BP 109/73 | HR 94 | Temp 98.1°F | Resp 20

## 2023-09-01 DIAGNOSIS — C189 Malignant neoplasm of colon, unspecified: Secondary | ICD-10-CM

## 2023-09-01 DIAGNOSIS — Z5111 Encounter for antineoplastic chemotherapy: Secondary | ICD-10-CM | POA: Diagnosis not present

## 2023-09-01 DIAGNOSIS — C7801 Secondary malignant neoplasm of right lung: Secondary | ICD-10-CM | POA: Diagnosis not present

## 2023-09-01 DIAGNOSIS — I864 Gastric varices: Secondary | ICD-10-CM | POA: Diagnosis not present

## 2023-09-01 DIAGNOSIS — C7931 Secondary malignant neoplasm of brain: Secondary | ICD-10-CM | POA: Diagnosis not present

## 2023-09-01 DIAGNOSIS — C7802 Secondary malignant neoplasm of left lung: Secondary | ICD-10-CM | POA: Diagnosis not present

## 2023-09-01 DIAGNOSIS — R112 Nausea with vomiting, unspecified: Secondary | ICD-10-CM | POA: Diagnosis not present

## 2023-09-01 DIAGNOSIS — C182 Malignant neoplasm of ascending colon: Secondary | ICD-10-CM

## 2023-09-01 DIAGNOSIS — C786 Secondary malignant neoplasm of retroperitoneum and peritoneum: Secondary | ICD-10-CM | POA: Diagnosis not present

## 2023-09-01 DIAGNOSIS — Z5189 Encounter for other specified aftercare: Secondary | ICD-10-CM | POA: Diagnosis not present

## 2023-09-01 DIAGNOSIS — C787 Secondary malignant neoplasm of liver and intrahepatic bile duct: Secondary | ICD-10-CM | POA: Diagnosis not present

## 2023-09-01 DIAGNOSIS — D696 Thrombocytopenia, unspecified: Secondary | ICD-10-CM | POA: Diagnosis not present

## 2023-09-01 DIAGNOSIS — C7971 Secondary malignant neoplasm of right adrenal gland: Secondary | ICD-10-CM | POA: Diagnosis not present

## 2023-09-01 LAB — BPAM PLATELET PHERESIS
Blood Product Expiration Date: 202504022359
ISSUE DATE / TIME: 202504011316
Unit Type and Rh: 7300

## 2023-09-01 LAB — PREPARE PLATELET PHERESIS: Unit division: 0

## 2023-09-01 MED ORDER — HEPARIN SOD (PORK) LOCK FLUSH 100 UNIT/ML IV SOLN
500.0000 [IU] | Freq: Once | INTRAVENOUS | Status: DC | PRN
Start: 2023-09-01 — End: 2023-09-01

## 2023-09-01 MED ORDER — SODIUM CHLORIDE 0.9% FLUSH
10.0000 mL | INTRAVENOUS | Status: DC | PRN
Start: 2023-09-01 — End: 2023-09-01

## 2023-09-01 NOTE — Patient Instructions (Signed)
 Managing Chemotherapy Side Effects, Adult Chemotherapy is a treatment that uses medicine to kill cancer cells. However, in addition to killing cancer cells, the medicines can also damage healthy cells. The damage to healthy cells can lead to side effects. The exact side effects depend on the specific medicines used. Most of the side effects of chemotherapy go away once treatment is finished. Until then, work closely with your health care providers and take an active role in managing your side effects. What are common side effects of chemotherapy? Increased risk of infection, bruising, or bleeding. Nausea and vomiting. Constipation or diarrhea. Loss of appetite. Hair loss. Mouth or throat sores. Tiredness (fatigue). Tingling, pain, or numbness in the hands and feet. Dry, sensitive, itchy, or sore skin. Sleep disturbances, such as excessive sleepiness. Confusion, anxiety, or mood swings. Memory changes. How to manage the side effects of chemotherapy Medicines Take over-the-counter and prescription medicines only as told by your health care provider. Talk with your health care provider before taking vitamins, herbs, supplements, or over-the-counter medicines. Some of these can interfere with chemotherapy. Activity Get plenty of rest. Get regular exercise by doing activities such as walking, gentle yoga, or tai chi. Return to your normal activities as told by your health care provider. Ask your health care provider what activities are safe for you. Eating and drinking  Talk to a dietitian about what you should eat and drink during cancer treatment. Drink enough fluid to keep your urine pale yellow. If you have side effects that affect eating, these tips may help: Eat smaller meals and snacks often. Drink high-nutrition and high-calorie shakes or supplements. Choose bland and soft foods that are easy to eat. Do not eat foods that are hot, spicy, or hard to swallow. Do not eat raw or  undercooked meat, eggs, or seafood. Always wash fresh fruits and vegetables well before eating them. Skin care If you have sore or itchy skin: Wear soft, comfortable clothing. Apply creams and ointments to your skin as told by your health care provider. If you lose your hair, consider wearing a wig, hat, or scarf to cover your head. You may want to have someone shave your head as you start to lose hair. During outdoor activities, protect your head and skin from the sun by using sunscreen with an SPF of 30 or higher or by wearing protective clothing and a hat. Meet with a hair and skin care specialist for makeup and skin care tips. Apply sunscreen to your scalp as told by your health care provider. General tips Learn as much as you can about your condition. If you are struggling emotionally, talk with a mental health care provider or join a support group. Keep all follow-up visits. This is important. How to prevent infection and bleeding Chemotherapy may lower your blood counts and put you at risk for infection and bleeding. Here are some ways to help prevent problems. Vaccines Talk to your health care provider about vaccines. You should not get any live vaccines, such as the polio, MMR, chickenpox, and shingles vaccines until your health care provider says that it is safe to do so. Do not be around people who have had live vaccines for as long as your health care provider recommends. Make sure you get a yearly flu shot. People who will be near you should also get a yearly flu shot. Social activity Stay away from crowded places where you could be exposed to germs. Do not be around people who may be sick or  people who have symptoms of a fever until they have been fever-free for at least 24 hours. Do not share food, cups, straws, or utensils with other people. Wear a mask when outside the home if your blood counts are low. Cleanliness  Wash your hands often for at least 20 seconds. Also make  sure that other members of your household wash their hands often. Brush your teeth twice daily using a soft toothbrush. Use mouth rinse only as told by your health care provider. Take a bath or shower daily unless your health care provider gives different instructions. General tips Take your temperature regularly, especially if you have chills or feel warm. Check with your health care provider: Before you travel. Before you have a dental procedure. Before you use a swimming pool, hot tub, or swim in a lake or ocean. If you get chemotherapy through an IV or port, check the site every day for signs of infection. Check for redness, swelling, pain, fluid, and warmth. Avoid activities that put you at risk for injury or bleeding. Use an electric razor to shave instead of a blade. Questions to ask your health care provider What are the most common side effects of my treatment? How will they affect my daily life? What can I do to manage them? What are some possible long-term side effects? What are possible complications? What support services are available? What number can I call with questions or concerns? Where to find support Cancer affects the entire family. Find out what family support resources are available from your cancer treatment center. For more support, turn to: Your cancer care team. Friends and family. Your religious community. Other people with cancer. Community-based or online support groups. Where to find more information National Cancer Institute: www.cancer.gov American Cancer Society: www.cancer.org Contact a health care provider if: You bleed or bruise more often. You notice blood in your urine or stool. You have any of these symptoms: A skin rash, or dry or itchy skin. A headache or stiff neck. Cold or flu symptoms. A cough. Persistent nausea or vomiting. Persistent diarrhea. Frequent urination, burning when passing urine, or foul-smelling urine. You cannot eat  because of mouth or throat pain. You are sad, confused, anxious, or depressed. Get help right away if: You have any of these symptoms: A fever or chills. Your health care provider should know about this right away. Redness, swelling, pain, fluid, or warmth near an IV site. Bleeding that you cannot stop. A seizure. You cannot swallow. You have chest pain. You have trouble breathing. A family member or caregiver should get help right away if you have a sudden or unusual change in behavior. These symptoms may be an emergency. Get help right away. Call 911. Do not wait to see if the symptoms will go away. Do not drive yourself to the hospital. Summary Chemotherapy is a treatment that uses medicine to kill cancer cells and can cause side effects. The specific side effects depend on the specific medicines used. Learn as much as you can about your condition. Ask about side effects to watch for and how to treat them. Seek out support and resources from others. Find out what family support resources are available from your cancer treatment center. Let your health care provider know if you notice any new, unusual, or worsening symptoms, especially fever or chills. This information is not intended to replace advice given to you by your health care provider. Make sure you discuss any questions you have with your health  care provider. Document Revised: 05/08/2021 Document Reviewed: 05/08/2021 Elsevier Patient Education  2024 [employer].

## 2023-09-03 ENCOUNTER — Other Ambulatory Visit: Payer: Self-pay

## 2023-09-03 ENCOUNTER — Telehealth: Payer: Self-pay | Admitting: Psychiatry

## 2023-09-03 DIAGNOSIS — F333 Major depressive disorder, recurrent, severe with psychotic symptoms: Secondary | ICD-10-CM

## 2023-09-03 MED ORDER — AUVELITY 45-105 MG PO TBCR: EXTENDED_RELEASE_TABLET | ORAL | 0 refills | Status: DC

## 2023-09-03 NOTE — Telephone Encounter (Signed)
 Rx for [facility] sent to the requested pharmacy.

## 2023-09-03 NOTE — Telephone Encounter (Signed)
 Shannon [name] called at 11:11 and reports that the Marvia Pickles is working well.  Taking 45 mg from samples given.  Please send in prescription to [id number] - [place], [place] - [address]   Appt 4/15

## 2023-09-06 ENCOUNTER — Telehealth: Payer: Self-pay

## 2023-09-06 ENCOUNTER — Inpatient Hospital Stay

## 2023-09-06 VITALS — BP 118/76 | HR 96 | Temp 98.4°F | Resp 20

## 2023-09-06 DIAGNOSIS — C786 Secondary malignant neoplasm of retroperitoneum and peritoneum: Secondary | ICD-10-CM | POA: Diagnosis not present

## 2023-09-06 DIAGNOSIS — C7931 Secondary malignant neoplasm of brain: Secondary | ICD-10-CM | POA: Diagnosis not present

## 2023-09-06 DIAGNOSIS — C7800 Secondary malignant neoplasm of unspecified lung: Secondary | ICD-10-CM

## 2023-09-06 DIAGNOSIS — C182 Malignant neoplasm of ascending colon: Secondary | ICD-10-CM

## 2023-09-06 DIAGNOSIS — C189 Malignant neoplasm of colon, unspecified: Secondary | ICD-10-CM | POA: Diagnosis not present

## 2023-09-06 DIAGNOSIS — C7801 Secondary malignant neoplasm of right lung: Secondary | ICD-10-CM | POA: Diagnosis not present

## 2023-09-06 DIAGNOSIS — I864 Gastric varices: Secondary | ICD-10-CM | POA: Diagnosis not present

## 2023-09-06 DIAGNOSIS — C787 Secondary malignant neoplasm of liver and intrahepatic bile duct: Secondary | ICD-10-CM | POA: Diagnosis not present

## 2023-09-06 DIAGNOSIS — C7802 Secondary malignant neoplasm of left lung: Secondary | ICD-10-CM | POA: Diagnosis not present

## 2023-09-06 DIAGNOSIS — Z5111 Encounter for antineoplastic chemotherapy: Secondary | ICD-10-CM | POA: Diagnosis not present

## 2023-09-06 DIAGNOSIS — Z5189 Encounter for other specified aftercare: Secondary | ICD-10-CM | POA: Diagnosis not present

## 2023-09-06 DIAGNOSIS — C7971 Secondary malignant neoplasm of right adrenal gland: Secondary | ICD-10-CM | POA: Diagnosis not present

## 2023-09-06 DIAGNOSIS — D696 Thrombocytopenia, unspecified: Secondary | ICD-10-CM | POA: Diagnosis not present

## 2023-09-06 DIAGNOSIS — R112 Nausea with vomiting, unspecified: Secondary | ICD-10-CM | POA: Diagnosis not present

## 2023-09-06 MED ORDER — FILGRASTIM-SNDZ 480 MCG/0.8ML IJ SOSY
480.0000 ug | PREFILLED_SYRINGE | Freq: Once | INTRAMUSCULAR | Status: AC
  Administered 2023-09-06: 480 ug via SUBCUTANEOUS
  Filled 2023-09-06: qty 0.8

## 2023-09-06 NOTE — Patient Instructions (Signed)
 Filgrastim Injection What is this medication? FILGRASTIM (fil GRA stim) lowers the risk of infection in people who are receiving chemotherapy. It works by Systems analyst make more white blood cells, which protects your body from infection. It may also be used to help people who have been exposed to high doses of radiation. It can be used to help prepare your body before a stem cell transplant. It works by helping your bone marrow make and release stem cells into the blood. This medicine may be used for other purposes; ask your health care provider or pharmacist if you have questions. COMMON BRAND NAME(S): Neupogen, Nivestym, Nypozi, Releuko, Zarxio What should I tell my care team before I take this medication? They need to know if you have any of these conditions: History of blood diseases, such as sickle cell anemia Kidney disease Recent or ongoing radiation An unusual or allergic reaction to filgrastim, pegfilgrastim, latex, rubber, other medications, foods, dyes, or preservatives Pregnant or trying to get pregnant Breast-feeding How should I use this medication? This medication is injected under the skin or into a vein. It is usually given by your care team in a hospital or clinic setting. It may be given at home. If you get this medication at home, you will be taught how to prepare and give it. Use exactly as directed. Take it as directed on the prescription label at the same time every day. Keep taking it unless your care team tells you to stop. It is important that you put your used needles and syringes in a special sharps container. Do not put them in a trash can. If you do not have a sharps container, call your pharmacist or care team to get one. This medication comes with INSTRUCTIONS FOR USE. Ask your pharmacist for directions on how to use this medication. Read the information carefully. Talk to your pharmacist or care team if you have questions. Talk to your care team about the use of  this medication in children. While it may be prescribed for children for selected conditions, precautions do apply. Overdosage: If you think you have taken too much of this medicine contact a poison control center or emergency room at once. NOTE: This medicine is only for you. Do not share this medicine with others. What if I miss a dose? It is important not to miss any doses. Talk to your care team about what to do if you miss a dose. What may interact with this medication? Medications that may cause a release of neutrophils, such as lithium This list may not describe all possible interactions. Give your health care provider a list of all the medicines, herbs, non-prescription drugs, or dietary supplements you use. Also tell them if you smoke, drink alcohol, or use illegal drugs. Some items may interact with your medicine. What should I watch for while using this medication? Your condition will be monitored carefully while you are receiving this medication. You may need bloodwork while taking this medication. Talk to your care team about your risk of cancer. You may be more at risk for certain types of cancer if you take this medication. What side effects may I notice from receiving this medication? Side effects that you should report to your care team as soon as possible: Allergic reactions--skin rash, itching, hives, swelling of the face, lips, tongue, or throat Capillary leak syndrome--stomach or muscle pain, unusual weakness or fatigue, feeling faint or lightheaded, decrease in the amount of urine, swelling of the ankles, hands,  or feet, trouble breathing High white blood cell level--fever, fatigue, trouble breathing, night sweats, change in vision, weight loss Inflammation of the aorta--fever, fatigue, back, chest, or stomach pain, severe headache Kidney injury (glomerulonephritis)--decrease in the amount of urine, red or dark brown urine, foamy or bubbly urine, swelling of the ankles, hands,  or feet Shortness of breath or trouble breathing Spleen injury--pain in upper left stomach or shoulder Unusual bruising or bleeding Side effects that usually do not require medical attention (report to your care team if they continue or are bothersome): Back pain Bone pain Fatigue Fever Headache Nausea This list may not describe all possible side effects. Call your doctor for medical advice about side effects. You may report side effects to FDA at 1-800-FDA-1088. Where should I keep my medication? Keep out of the reach of children and pets. Keep this medication in the original packaging until you are ready to take it. Protect from light. See product for storage information. Each product may have different instructions. Get rid of any unused medication after the expiration date. To get rid of medications that are no longer needed or have expired: Take the medication to a medications take-back program. Check with your pharmacy or law enforcement to find a location. If you cannot return the medication, ask your pharmacist or care team how to get rid of this medication safely. NOTE: This sheet is a summary. It may not cover all possible information. If you have questions about this medicine, talk to your doctor, pharmacist, or health care provider.  2024 Elsevier/[employer] Standard (2021-10-09 00:00:00)

## 2023-09-06 NOTE — Telephone Encounter (Signed)
 Called pt back to discuss getting appt with [facility]

## 2023-09-07 ENCOUNTER — Other Ambulatory Visit: Payer: Self-pay

## 2023-09-07 ENCOUNTER — Inpatient Hospital Stay

## 2023-09-07 ENCOUNTER — Encounter: Payer: Self-pay | Admitting: Oncology

## 2023-09-07 VITALS — BP 132/77 | HR 87 | Temp 98.0°F | Resp 18

## 2023-09-07 DIAGNOSIS — C7802 Secondary malignant neoplasm of left lung: Secondary | ICD-10-CM | POA: Diagnosis not present

## 2023-09-07 DIAGNOSIS — R112 Nausea with vomiting, unspecified: Secondary | ICD-10-CM | POA: Diagnosis not present

## 2023-09-07 DIAGNOSIS — C182 Malignant neoplasm of ascending colon: Secondary | ICD-10-CM

## 2023-09-07 DIAGNOSIS — C7971 Secondary malignant neoplasm of right adrenal gland: Secondary | ICD-10-CM | POA: Diagnosis not present

## 2023-09-07 DIAGNOSIS — C787 Secondary malignant neoplasm of liver and intrahepatic bile duct: Secondary | ICD-10-CM | POA: Diagnosis not present

## 2023-09-07 DIAGNOSIS — I864 Gastric varices: Secondary | ICD-10-CM | POA: Diagnosis not present

## 2023-09-07 DIAGNOSIS — D696 Thrombocytopenia, unspecified: Secondary | ICD-10-CM | POA: Diagnosis not present

## 2023-09-07 DIAGNOSIS — C7801 Secondary malignant neoplasm of right lung: Secondary | ICD-10-CM | POA: Diagnosis not present

## 2023-09-07 DIAGNOSIS — C786 Secondary malignant neoplasm of retroperitoneum and peritoneum: Secondary | ICD-10-CM | POA: Diagnosis not present

## 2023-09-07 DIAGNOSIS — Z5111 Encounter for antineoplastic chemotherapy: Secondary | ICD-10-CM | POA: Diagnosis not present

## 2023-09-07 DIAGNOSIS — C189 Malignant neoplasm of colon, unspecified: Secondary | ICD-10-CM

## 2023-09-07 DIAGNOSIS — C7931 Secondary malignant neoplasm of brain: Secondary | ICD-10-CM | POA: Diagnosis not present

## 2023-09-07 DIAGNOSIS — Z5189 Encounter for other specified aftercare: Secondary | ICD-10-CM | POA: Diagnosis not present

## 2023-09-07 MED ORDER — FILGRASTIM-SNDZ 480 MCG/0.8ML IJ SOSY
480.0000 ug | PREFILLED_SYRINGE | Freq: Once | INTRAMUSCULAR | Status: AC
  Administered 2023-09-07: 480 ug via SUBCUTANEOUS
  Filled 2023-09-07: qty 0.8

## 2023-09-07 MED ORDER — SODIUM CHLORIDE 0.9 % IV SOLN: INTRAVENOUS | Status: AC

## 2023-09-08 ENCOUNTER — Inpatient Hospital Stay

## 2023-09-08 VITALS — BP 124/79 | HR 88 | Temp 97.9°F | Resp 18

## 2023-09-08 DIAGNOSIS — C182 Malignant neoplasm of ascending colon: Secondary | ICD-10-CM

## 2023-09-08 DIAGNOSIS — Z5111 Encounter for antineoplastic chemotherapy: Secondary | ICD-10-CM | POA: Diagnosis not present

## 2023-09-08 DIAGNOSIS — C787 Secondary malignant neoplasm of liver and intrahepatic bile duct: Secondary | ICD-10-CM | POA: Diagnosis not present

## 2023-09-08 DIAGNOSIS — C7802 Secondary malignant neoplasm of left lung: Secondary | ICD-10-CM | POA: Diagnosis not present

## 2023-09-08 DIAGNOSIS — Z5189 Encounter for other specified aftercare: Secondary | ICD-10-CM | POA: Diagnosis not present

## 2023-09-08 DIAGNOSIS — C189 Malignant neoplasm of colon, unspecified: Secondary | ICD-10-CM | POA: Diagnosis not present

## 2023-09-08 DIAGNOSIS — R112 Nausea with vomiting, unspecified: Secondary | ICD-10-CM | POA: Diagnosis not present

## 2023-09-08 DIAGNOSIS — D696 Thrombocytopenia, unspecified: Secondary | ICD-10-CM | POA: Diagnosis not present

## 2023-09-08 DIAGNOSIS — C7971 Secondary malignant neoplasm of right adrenal gland: Secondary | ICD-10-CM | POA: Diagnosis not present

## 2023-09-08 DIAGNOSIS — C7931 Secondary malignant neoplasm of brain: Secondary | ICD-10-CM | POA: Diagnosis not present

## 2023-09-08 DIAGNOSIS — C786 Secondary malignant neoplasm of retroperitoneum and peritoneum: Secondary | ICD-10-CM | POA: Diagnosis not present

## 2023-09-08 DIAGNOSIS — C7801 Secondary malignant neoplasm of right lung: Secondary | ICD-10-CM | POA: Diagnosis not present

## 2023-09-08 DIAGNOSIS — I864 Gastric varices: Secondary | ICD-10-CM | POA: Diagnosis not present

## 2023-09-08 MED ORDER — FILGRASTIM-SNDZ 480 MCG/0.8ML IJ SOSY
480.0000 ug | PREFILLED_SYRINGE | Freq: Once | INTRAMUSCULAR | Status: AC
  Administered 2023-09-08: 480 ug via SUBCUTANEOUS
  Filled 2023-09-08: qty 0.8

## 2023-09-10 ENCOUNTER — Telehealth: Payer: Self-pay | Admitting: Psychiatry

## 2023-09-10 ENCOUNTER — Telehealth: Payer: Self-pay | Admitting: *Deleted

## 2023-09-10 NOTE — Telephone Encounter (Signed)
 Pt LVM @ 2:36p stating that the pharmacy has sent [name] a PA for the Auvelity.  She is asking [name] to do whatever the next steps are.  She may need to get more samples depending on when it's approved.   Next appt 4/115

## 2023-09-10 NOTE — Telephone Encounter (Signed)
 Scheduling request sent for F/U appointment with Dr Barbaraann Cao after scheduled MRI on 10/12/23.

## 2023-09-11 NOTE — Telephone Encounter (Signed)
 Prior Authorization initiated with [employer] for Auvelity 45-105 mg #60, pending response

## 2023-09-11 NOTE — Telephone Encounter (Signed)
 No PA received but will initiate for [employer]

## 2023-09-13 NOTE — Telephone Encounter (Signed)
 Prior Approval received effective through 09/11/24 for Auvelity 45-105 mg

## 2023-09-14 ENCOUNTER — Telehealth: Payer: Self-pay | Admitting: Internal Medicine

## 2023-09-14 ENCOUNTER — Ambulatory Visit (INDEPENDENT_AMBULATORY_CARE_PROVIDER_SITE_OTHER): Payer: 59 | Admitting: Psychiatry

## 2023-09-14 ENCOUNTER — Encounter: Payer: Self-pay | Admitting: Psychiatry

## 2023-09-14 DIAGNOSIS — F422 Mixed obsessional thoughts and acts: Secondary | ICD-10-CM | POA: Diagnosis not present

## 2023-09-14 DIAGNOSIS — R530 Neoplastic (malignant) related fatigue: Secondary | ICD-10-CM | POA: Diagnosis not present

## 2023-09-14 DIAGNOSIS — F333 Major depressive disorder, recurrent, severe with psychotic symptoms: Secondary | ICD-10-CM | POA: Diagnosis not present

## 2023-09-14 DIAGNOSIS — G3184 Mild cognitive impairment, so stated: Secondary | ICD-10-CM | POA: Diagnosis not present

## 2023-09-14 DIAGNOSIS — G4721 Circadian rhythm sleep disorder, delayed sleep phase type: Secondary | ICD-10-CM

## 2023-09-14 MED ORDER — MODAFINIL 200 MG PO TABS: ORAL_TABLET | ORAL | 0 refills | Status: DC

## 2023-09-14 MED ORDER — AUVELITY 45-105 MG PO TBCR: EXTENDED_RELEASE_TABLET | ORAL | 4 refills | Status: DC

## 2023-09-14 MED ORDER — TRAZODONE HCL 50 MG PO TABS: ORAL_TABLET | ORAL | 1 refills | Status: DC

## 2023-09-14 MED ORDER — FLUVOXAMINE MALEATE 100 MG PO TABS: 300.0000 mg | ORAL_TABLET | Freq: Every day | ORAL | 0 refills | Status: DC

## 2023-09-14 NOTE — Progress Notes (Signed)
 Shannon [name] [id number] [birth year] 58 y.o.    Subjective:   Patient ID:  Shannon [name] is a 58 y.o. (DOB 1965-07-21) female.  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  seen November 2020 .  No meds were changed at that time.  10/16/19 the following is noted at this appt: Asked questions about sleep supplement.   Just started GI doctor.   Good overall. No mood swings. No depressive episodes.  Cry more with hormonal changes more easily.   Probably sleep too much to avoid.  Occ stress from work interferes with sleep.  Not as productive from home.  Hopes to get back to the office.  Still working from home.  Boss noticed her forgetfulness with deadlines and missing issues in emails.  Used to be sharper. Wonders if related to meds.  Can usually nap if desired for years and not seasonal.  Only anxiety is Covid related. More poor self care.   Hysterectomy December 2020. No med changes  03/18/2020 appointment with the following noted: July had Covid and both parents and her father died from [place]. Able to help mom with arrangements. Required one iron infusion this year. Never took NAC bc it was too hard to take. Sleep, eating schedule is irregular. More productive at home helps her feel better. Plan: No med changes. 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 For a month had dizzy, falls, NV all of which cleared quickly after removal.  Radiation to brain after this. More CA in lungs and abdomen and having chemo.  Oncologist said she has about 5 years of lifespan left.  MRI and  CT pending. Gotten into therapy starting Shannon [name] next week.   Pray a lot and good connection with friends.  Good support.  Trying to do things for fun.   Is working but finding it difficult mentally and physically DT SE fatigue from chemo. Considering LT disability but $ concerns.   If anxious trouble falling asleep.   Works from home.   D 58 yo.   Plan: No med changes except added modafinil  11/26/2020 appointment with the following noted: Added modafinil and didn't notice much from it. Sleep average about 7 hours.  Would prefer 10 hours and may nap here and there in week of chemo even on chemo.  Had to get it at [facility] with [facility]. No dx of OSA. Last 2 weeks a little low emotionally and mentally after up for a couple of weeks with company and it wore her out. Sleepiness is normally worse than tiredness. Only situational mood effects. Plan: No benefit 200 mg modafinil nor SE therefore Increase Modafinil to 300 mg daily for excessive sleepiness  04/21/2021 appointment with the following noted: Still dealing with chemotx for colon cancer Thinks increase modafinil questionable but misses it more than takes it. No SE. Things are getting harder rough month with more depression and anxiety related to cancer and daughter.  Work is hard.  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  Missing deadlines.   Needs to call social security. D  58 yo and in therapy with Shannon [name].  So angry with patient. Has been able to stay on branded Seroquel. Tumors on CT scan  are shrinking recently. Plan: HX RELAPSE PSYCHOSIS WITH GENERIC QUETIAPINE, Continue branded Seroquel 800 mg HS and lamotrigine 150 mg daily and Luvox 300 mg daily Vraylar 1.5 mg daily for 2 weeks, Then if tolerated increase to 3 mg daily. If mood improves then reduce Seroquel to 1 and 1/2 tablets.  06/06/2021 appt noted: I like it.  More clear headed and better energy.  Missed a few bc out of samples. Reduced  Seroquel 600 mg HS Less depressed.  Especially right after starting and others noticed the benefit. Plan : Continue lamotrigine 150 mg daily and Luvox 300 mg daily Vraylar 3 mg daily. Reduce Seroquel to 400 mg nightly for 2 weeks,  Then reduce Seroquel to 200 mg nightly for 2 weeks,  Then reduce to 100 mg for 2 weeks, Then reduce to 50 mg 2 weeks then stop if you can still sleep ok  08/07/2021 appointment with the following noted: Continues Vraylar 3 mg and Luvox 300 mg daily.  Reduced Seroquel to 200 mg nightly and takes Ambien 5 mg nightly Went slowly down with Seroquel. Doing pretty good.  Some night hard to go to sleep but usally ok.   Still please with Vraylar less sedated. No mood swings.  Some struggle with depression with reduced motivation and enjoyment.  Sometimes bored and would rather go to bed.  Don't want to read or watch TV.   Can follow a show but doesn't find it intersting.  CT scan show tumors shrinking. Plan: Continue lamotrigine 150 mg daily and Luvox 300 mg daily Reduce Vraylar 3 mg every other day to see If depression energy and interest and enjoyment is better Reduce Seroquel to 150 for 2 weeks Then reduce to 100 mg for 2 weeks, Then reduce to 50 mg 2 weeks then stop if you can still sleep ok Wants to have access to Ambien Plan: No benefit 200 mg modafinil nor SE therefore OK continue trial  Modafinil to 300 mg daily for excessive sleepiness  10/30/2021 appointment with the following noted: Reduced quetiapine to 100 mg and trouble sleeping so back to 150 mg HS Fine with reduced Vraylar to 3 mg QOD Increase modafinil to 300 mg and is more alert and more active. No SE Overall mood is ok but still likes to resto often but not sleeping as much.  Not sure if it is chemo related.  Will rest after activity. At night sleeps 10 hours. Still hard to enjoy things and part of reason will lay down bc is bored.   Changed TV cable and not watching much now.  Can't concentrate to  read well.  Enjoys dog.  Enjoying some projects at home.  Patient denies difficulty with sleep maintenance.   Denies appetite disturbance.  Patient reports that motivation have been good.  Patient denies any suicidal ideation.  Still too monotone. This is last week at work.  Not doing well at work DT concentration.  Will apply for disability. Ativan only every few weeks. Plan: Continue lamotrigine 150 mg daily  Continue Vraylar 3 mg every other day  Try again to Reduce Seroquel to 100 mg for 2 weeks, Then reduce to 50 mg 2 weeks then stop if you can still sleep ok Wants to have access to Ambien Reduce Luvox to 2 and 1/2 tablets Start Wellbutrin in the AM 1 for 1  week then 2 or 300 mg AM  12/22/21 appt noted: Reduced quetiapine to 50 and was OK but when stopped couldn't sleep.  Less daytime drowsy with less.   Reduced fluvoxamine to 250 and started Wellbutrin XL 300 mg AM. More even keel level and less down.  More content.  More steady.  Less teary and emotional. No SE problems with change. ? Modafinil in hopes of better concentration and clarity but ? Effect. Has some trouble with conc but it varies. No increase anxiety or jitteriness. Plan: Continue lamotrigine 150 mg daily  Continue Vraylar 3 mg every other day  Try again to Reduce Seroquel to 25-50 mg HS Wants to have access to Ambien Continue Luvox to 2 and 1/2 tablets Increase Wellbutrin to 450 mg AM residual depression anhedonia  03/11/2022 appointment noted Increase Wellbutrin to 450 mg AM residual depression anhedonia is partially better.  Not as heavy and cloudy.  Energy a little better until last 2 chemos wiped her out more with nausea for 6 days.  Chemo every 3 weeks. Enjoys her dog and helps her get up.   No SE with Wellbutrin.   Minimal anxiety usually.  Except a couple of days per week.  Anxiety over bills.   No sig obsessions.   Problems with daughter are stressful.  D drinks too much and sleeps all day and has social  problems and irresponsibility. D Not working.  She is 58 yo and very needy and childlike emotionally. Reduced Seroquel to 25 mg HS and willing to try to stop.  Avg 10 hour and some naps. But no longer hangover without much Seroquel. Has taken more Ambien. Plan: HX RELAPSE PSYCHOSIS WITH GENERIC QUETIAPINE, Continue lamotrigine 150 mg daily  Continue Vraylar 3 mg every other day  Try again to Reduce Seroquel if possible to 0 or  25 mg HS Wants to have access to Ambien Continue Luvox to 2 and 1/2 tablets Continue  Wellbutrin to 450 mg AM residual depression anhedonia OK continue Modafinil to 300 mg daily for excessive sleepiness and cancer related fatigue  10/15/22 appt noted: Meds as above and off Seroquel.  Rare Ambien.    No sig SE  Lately sleeping ok.  Mouth guard helped clenching of mouth.  Occ night of barely sleeping but better with mouth guard. Mood is ok . Anxiety high DT stressors.  Talking with friends family helps with stress.   Primary stressors CA and D 58 yo.   Disc sleep.   No sig obsessions.  No panic she can't handle.   Plan: Asks about trying to reduce some of the meds.  Disc options of fluvoxamine vs lamotrigine. Reduce lamotrigine to 1 of the 100 mg tablets for 1 month then reduce to 1/2 of the 100 mg for 2 weeks and if you feel ok stop it. Continue Vraylar 3 mg every other day  Off Seroquel Wants to have access to Ambien Continue Luvox to 2 and 1/2 tablets Continue  Wellbutrin to 450 mg AM residual depression anhedonia OK continue Modafinil to 300 mg daily for excessive sleepiness and cancer related fatigue  01/19/23 appt noted: Couldn't tell a difference off the lamotrigine. Meds: fluvox 250, lorazepam 0.5 rare, modafinil 300, Vraylar 3 mg every other day, Wellbutrin XL 450 AM, off lamotrigine. No Ambien Pretty good, stable, even keel.  Mild dep sx chronically.   No sig panic or anxity. Sleep variable.  Trouble staying asleep.  Naps an hour or more twice daily. 5-6  hours  sleep at night.  Still feels drowsy in the day and even driving.   No SE Plan: Continue Vraylar 3 mg every other day  Wants to have access to Ambien but doesn't keep her asleep.  Continue Luvox to 2 and 1/2 tablets Continue  Wellbutrin to 450 mg AM residual depression anhedonia OK continue Modafinil to 300 mg daily for excessive sleepiness and cancer related fatigue Disc EFA, trial trazodone 50-100 mg HS  05/20/23 appt noted: Meds as above: trazodone 50 HS, others as noted. Still some nights of not sleeping even with trazodone every 2-3 weeks. Overall pretty good but still some dep and anxiety over the cancer and life.  Cancer treatment effectiveness is going well. Needs to do better to find things that engage her mind and the dep is holding her back in that area. D is still a big stress and is dependent onher.  Plan: DC Wellbutrin and trial Auvelity 1 Am and then BID for residual depression anhedonia  07/22/23 appt noted: Meds:  fluvox 250, lorazepam 0.5 rare, modafinil 300, Vraylar 3 mg every other day, Wellbutrin XL 450 AM, off lamotrigine. No Ambien. Trazodone 100 mg HS Never took Auvelity bc cost  $1000.   I feel a little better lately, lighter.  Still a lot of anxiety about things.  Dep a little easier.  Not normal interest but better.  Is willing to try Auvelity if cost can be avoided.  Hard to read DT inattention.  Some social activity.   Physically feels beaten down and tired.  Stumble a lot. Poor appetite and can't eat much.   Takes awhile to fall asleep.  11-4 then awakens and back to sleep until 12. No other sig changes. Extended chemo sessions bc good CT scans.  Pending scan from today.  No difference in SE with them spread out.  Plan: DC Wellbutrin and trial Auvelity 1 Am and then BID for residual depression anhedonia.  If not helpful switch back.  If helpful will need to use generic products.    09/14/23 appt noted: Meds:  fluvox 250, lorazepam 0.5 rare, modafinil 300,  Vraylar 3 mg every other day, Auvelity BID, off lamotrigine. No Ambien. Trazodone 100 mg HS Will run out today.  Auvelity PA approved but cost might be a barrier.  Elevated mood with Auvelity and more calm off Wellbtrin too.   No SE.   Other meds the same.  Sleep pretty good with trazodone.  Satisfied.     Past Psychiatric Medication Trials:  HX RELAPSE PSYCHOSIS WITH GENERIC QUETIAPINE,  Seroquel 800, Risperdal 2, inVega 3, Abilify 15, Vraylar 3 Citalopram, fluoxetine 80, sertraline to 50, fluvoxamine 300 mg daily Wellbutrin Auvleity better than Wellbutrin lamotrigine 200,  Modafinil 300 Ambien. No Lunesta,  Trazodone helped Has been under the care of the psychiatrist since March 2000  D seeing Shannon [name]  Review of Systems:  Review of Systems  Constitutional:  Positive for fatigue.  HENT:  Negative for rhinorrhea.   Eyes:  Negative for discharge.  Cardiovascular:  Negative for palpitations.  Gastrointestinal:  Positive for constipation. Negative for abdominal distention.  Neurological:  Positive for weakness. Negative for tremors.       No history of SZ  Psychiatric/Behavioral:  Positive for decreased concentration and dysphoric mood. The patient is nervous/anxious.     Medications: I have reviewed the patient's current medications.  Current Outpatient Medications  Medication Sig Dispense Refill   acetaminophen (TYLENOL) 325 MG tablet Take 2 tablets (650 mg total)  by mouth every 6 (six) hours as needed for mild pain.     atorvastatin (LIPITOR) 10 MG tablet Take by mouth.     cariprazine (VRAYLAR) 3 MG capsule Take 1 capsule (3 mg total) by mouth daily. (Patient taking differently: Take 3 mg by mouth daily. 1 every other day) 90 capsule 0   CONTOUR NEXT TEST test strip 3 (three) times daily.     Dulaglutide (TRULICITY) 3 MG/0.5ML SOAJ Inject 3 mg into the skin once a week. 6 mL 1   hyoscyamine (LEVSIN SL) 0.125 MG SL tablet Place 1 tablet (0.125 mg total) under the  tongue every 6 (six) hours as needed. 45 tablet 1   insulin aspart (NOVOLOG FLEXPEN) 100 UNIT/ML FlexPen Inject 8 Units into the skin 3 (three) times daily with meals. 15 mL 11   insulin glargine (LANTUS) 100 UNIT/ML Solostar Pen Inject 26 Units into the skin daily. 15 mL 11   levothyroxine (SYNTHROID) 75 MCG tablet Take 1 tablet (75 mcg total) by mouth daily. 30 tablet 0   loratadine (CLARITIN) 10 MG tablet Take 10 mg by mouth daily.     LORazepam (ATIVAN) 0.5 MG tablet Take 1 tablet (0.5 mg total) by mouth every 8 (eight) hours as needed for anxiety. 30 tablet 3   magic mouthwash SOLN Take 10 mLs by mouth 3 (three) times daily as needed for mouth pain.     Multiple Vitamin (MULTI-VITAMIN) tablet Take 1 tablet by mouth daily.     NOVOFINE PEN NEEDLE 32G X 6 MM MISC SMARTSIG:Syringe(s) SUB-Q     omeprazole (PRILOSEC) 40 MG capsule Take 40 mg by mouth daily.     ondansetron (ZOFRAN) 4 MG tablet Take 1 tablet (4 mg total) by mouth every 4 (four) hours as needed for nausea. 90 tablet 3   ondansetron (ZOFRAN-ODT) 4 MG disintegrating tablet Take 1 tablet (4 mg total) by mouth every 4 (four) hours as needed for nausea or vomiting. Dissolve on tongue 90 tablet 3   promethazine (PHENERGAN) 25 MG tablet Take 1 tablet (25 mg total) by mouth every 6 (six) hours as needed for nausea. 30 tablet 3   senna-docusate (SENOKOT-S) 8.6-50 MG tablet Take 2 tablets by mouth at bedtime.     sucralfate (CARAFATE) 1 g tablet Take 1 g by mouth 4 (four) times daily.     Dextromethorphan-buPROPion ER (AUVELITY) 45-105 MG TBCR Take 1 tablet twice daily 60 tablet 4   fluvoxaMINE (LUVOX) 100 MG tablet Take 3 tablets (300 mg total) by mouth at bedtime. 270 tablet 0   modafinil (PROVIGIL) 200 MG tablet TAKE 1 & 1/2 (ONE & ONE-HALF) TABLETS BY MOUTH ONCE DAILY 135 tablet 0   traZODone (DESYREL) 50 MG tablet 1-2 tablets at night as needed for sleep 180 tablet 1   No current facility-administered medications for this visit.     Medication Side Effects: None  Allergies:  Allergies  Allergen Reactions   Leucovorin Hives, Rash and Other (See Comments)    Redness on face and neck.  Confirmed that it was the leucovorin causing this on 11/19/21.  We are deleting this from future plan.   Clindamycin/Lincomycin Rash   Irinotecan Rash   Penicillins Rash    Reaction: 10 years    Past Medical History:  Diagnosis Date   Anemia    Iron De   Anxiety    Blood clot in vein    Bowel obstruction (HCC)    Colon cancer (HCC) 2018   treated with  surgery and chemotherapy   Depression    Diabetes mellitus without complication (HCC)    Type II   DVT (deep venous thrombosis) (HCC) 2019   behind right knee- while she wasa on chemo   GERD (gastroesophageal reflux disease)    History of blood transfusion    History of chemotherapy    History of kidney stones 2012   passed   Hypothyroidism    Obesity     Family History  Problem Relation Age of Onset   Irritable bowel syndrome Mother    Colon polyps Father    Breast cancer Maternal Grandmother    Diabetes Maternal Grandmother    Diabetes Maternal Grandfather    Breast cancer Paternal Grandmother    Colon polyps Maternal Uncle    Stomach cancer Neg Hx    Pancreatic cancer Neg Hx    Esophageal cancer Neg Hx    Colon cancer Neg Hx     Social History   Socioeconomic History   Marital status: Widowed    Spouse name: Not on file   Number of children: Not on file   Years of education: Not on file   Highest education level: Not on file  Occupational History   Occupation: care coordinator   Tobacco Use   Smoking status: Never   Smokeless tobacco: Never  Vaping Use   Vaping status: Never Used  Substance and Sexual Activity   Alcohol use: Yes    Comment: rare   Drug use: Never   Sexual activity: Not Currently  Other Topics Concern   Not on file  Social History Narrative   Not on file   Social Drivers of Health   Financial Resource Strain: Not on  file  Food Insecurity: Not on file  Transportation Needs: Not on file  Physical Activity: Not on file  Stress: Not on file  Social Connections: Not on file  Intimate Partner Violence: Not on file    Past Medical History, Surgical history, Social history, and Family history were reviewed and updated as appropriate.   Please see review of systems for further details on the patient's review from today.   Objective:   Physical Exam:  LMP 02/22/2019 (Approximate)   Physical Exam Constitutional:      General: She is not in acute distress. Musculoskeletal:        General: No deformity.  Neurological:     Mental Status: She is alert and oriented to person, place, and time.     Coordination: Coordination normal.  Psychiatric:        Attention and Perception: Attention and perception normal. She does not perceive auditory or visual hallucinations.        Mood and Affect: Mood is anxious and depressed. Affect is not labile, blunt or inappropriate.        Speech: Speech normal. Speech is not rapid and pressured.        Behavior: Behavior normal.        Thought Content: Thought content normal. Thought content is not paranoid or delusional. Thought content does not include homicidal or suicidal ideation.        Cognition and Memory: Cognition and memory normal.        Judgment: Judgment normal.     Comments: Insight intact Less but not gone re: anhedonia and depression with switch to [facility]     Lab Review:     Component Value Date/Time   NA 141 08/30/2023 0923   NA 138 03/15/2023 0000  K 4.4 08/30/2023 0923   CL 102 08/30/2023 0923   CO2 29 08/30/2023 0923   GLUCOSE 226 (H) 08/30/2023 0923   BUN 12 08/30/2023 0923   BUN 16 03/15/2023 0000   CREATININE 1.24 (H) 08/30/2023 0923   CALCIUM 9.4 08/30/2023 0923   PROT 5.7 (L) 08/30/2023 0923   ALBUMIN 3.6 08/30/2023 0923   AST 37 08/30/2023 0923   ALT 42 08/30/2023 0923   ALKPHOS 198 (H) 08/30/2023 0923   BILITOT 0.4  08/30/2023 0923   GFRNONAA 51 (L) 08/30/2023 0923   GFRNONAA 52 10/23/2022 0000       Component Value Date/Time   WBC 2.9 (L) 08/30/2023 0923   WBC 4.8 06/07/2020 0525   RBC 3.53 (L) 08/30/2023 0923   HGB 11.5 (L) 08/30/2023 0923   HCT 35.8 (L) 08/30/2023 0923   PLT 41 (L) 08/30/2023 0923   MCV 101.4 (H) 08/30/2023 0923   MCV 101 (A) 01/04/2023 0000   MCH 32.6 08/30/2023 0923   MCHC 32.1 08/30/2023 0923   RDW 14.2 08/30/2023 0923   LYMPHSABS 1.0 08/30/2023 0923   MONOABS 0.3 08/30/2023 0923   EOSABS 0.1 08/30/2023 0923   BASOSABS 0.0 08/30/2023 0923    No results found for: "POCLITH", "LITHIUM"   No results found for: "PHENYTOIN", "PHENOBARB", "VALPROATE", "CBMZ"   .res Assessment: Plan:    Severe recurrent major depression with psychotic features (HCC) - Plan: Dextromethorphan-buPROPion ER (AUVELITY) 45-105 MG TBCR, modafinil (PROVIGIL) 200 MG tablet  Mixed obsessional thoughts and acts - Plan: fluvoxaMINE (LUVOX) 100 MG tablet  Delayed sleep phase syndrome - Plan: traZODone (DESYREL) 50 MG tablet  Mild cognitive impairment - Plan: modafinil (PROVIGIL) 200 MG tablet  Neoplastic malignant related fatigue - Plan: modafinil (PROVIGIL) 200 MG tablet   30 min Overall mood and anxiety are better managed with Auvelity.   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  Further. better with the switch to Auvelity.  Continue Vraylar 3 mg every other day  Wants to have access to Ambien but doesn't keep her asleep.  Continue Luvox to 2 and 1/2 tablets  Better mood and anxiety with DC Wellbutrin and trial Auvelity 1 Am and then BID for residual depression anhedonia.     If helpful will possibly  need to use generic products.  This was discussed.  But PA approved.  OK continue Modafinil to 300 mg daily for excessive sleepiness and cancer related fatigue  trazodone 50-100 mg HS Option Lunesta alternative  Discussed  potential metabolic side effects associated with atypical antipsychotics, as well as potential risk for movement side effects. Advised pt to contact office if movement side effects occur.   Disc polypharmacy and will try to eliminate some meds if depression improves.  Option reduce fluvox at follow up.   FU 8 weeks  Nori Beat, MD, DFAPA   Please see After Visit Summary for patient specific instructions.  Future Appointments  Date Time Provider Department Center  09/20/2023 11:45 AM Ciro Cress A, MD [facility] None  09/20/2023 12:30 PM [facility] INFUSION CHAIR 1 [facility] None  09/22/2023  2:30 PM [facility] INJECTION [facility] None  09/27/2023  2:30 PM [facility]JE[facility] None  09/28/2023  2:30 PM [facility] INJE[facility] None  09/29/2023  2:30 PM [facility] INJE[facility] None  10/12/2023  1:50 PM [facility] MR 3 [facility] [address] [facility]  10/18/2023  7:00 AM [facility] CONFERENCE [facility] None  10/18/2023 12:30 PM Vaslow, Zachary  K, MD [facility] None  12/15/2023  4:00 PM Cottle, Kennedy Peabody., MD [facility] None    No orders of the defined types were placed in this encounter.      -------------------------------

## 2023-09-14 NOTE — Telephone Encounter (Signed)
 Scheduled patient's appts. Advised patient to contact us if rescheduling is needed. Provided my direct line.

## 2023-09-15 ENCOUNTER — Other Ambulatory Visit

## 2023-09-15 ENCOUNTER — Other Ambulatory Visit: Payer: Self-pay

## 2023-09-16 ENCOUNTER — Other Ambulatory Visit

## 2023-09-16 ENCOUNTER — Encounter (HOSPITAL_BASED_OUTPATIENT_CLINIC_OR_DEPARTMENT_OTHER): Payer: Self-pay

## 2023-09-16 ENCOUNTER — Other Ambulatory Visit (HOSPITAL_BASED_OUTPATIENT_CLINIC_OR_DEPARTMENT_OTHER): Admitting: Radiology

## 2023-09-16 DIAGNOSIS — C182 Malignant neoplasm of ascending colon: Secondary | ICD-10-CM | POA: Diagnosis not present

## 2023-09-16 LAB — LAB REPORT - SCANNED: EGFR: 53

## 2023-09-19 NOTE — Progress Notes (Unsigned)
 [facility]  [address] [place],  [place]  [address] [phone]  Clinic Day:  09/20/2023  Referring physician: Orin Birk, MD  HISTORY OF PRESENT ILLNESS:  The patient is a 58 y.o. female with  metastatic colon cancer, which includes spread of disease to her abdominal cavity, lungs, brain and right adrenal gland.  She comes in today to be evaluated before heading into her 23rd cycle of  FOLFOX/Avastin .  She also comes in today to go over her scans to ensure previous clots in her portal venous system have dissipated.  Since her last visit, the patient has been doing fairly well.  However, she does have intermittent abdominal pain, as well as nausea/vomiting that lasts 1-2 days at a time.  Otherwise, she denies having any new symptoms or findings which concern her for overt signs of disease progression.    With respect to her colon cancer history, she is status post a right hemicolectomy in early October 2018, followed by 12 cycles of adjuvant FOLFOX chemotherapy, which were completed in April 2019.  In December 2021, she underwent a left cerebellar metastasectomy, whose pathology was consistent with metastatic colon cancer.  Tumor testing did come back MMR normal. CT scans revealed evidence of her cancer being in multiple locations, for which she took 40 cycles of FOLFIRI/Avastin .  As follow-up scans revealed liver metastasis, she was placed back on FOLFOX/Avastin , for which she continues to take.  PHYSICAL EXAM:  Blood pressure 123/86, pulse 99, temperature 99.3 F (37.4 C), temperature source Oral, resp. rate 16, height 5\' 8"  (1.727 m), weight 151 lb 8 oz (68.7 kg), last menstrual period 02/22/2019, SpO2 100%. Body mass index is 23.04 kg/m.  Performance status (ECOG): 1 - Symptomatic but completely ambulatory  Physical Exam Vitals and nursing note reviewed.  Constitutional:      General: She is not in acute distress.    Appearance: Normal  appearance.  HENT:     Head: Normocephalic and atraumatic.     Mouth/Throat:     Mouth: Mucous membranes are moist.     Pharynx: Oropharynx is clear. No oropharyngeal exudate or posterior oropharyngeal erythema.  Eyes:     General: No scleral icterus.    Extraocular Movements: Extraocular movements intact.     Conjunctiva/sclera: Conjunctivae normal.     Pupils: Pupils are equal, round, and reactive to light.  Cardiovascular:     Rate and Rhythm: Normal rate and regular rhythm.     Heart sounds: Normal heart sounds. No murmur heard.    No friction rub. No gallop.  Pulmonary:     Effort: Pulmonary effort is normal.     Breath sounds: Normal breath sounds. No wheezing, rhonchi or rales.  Abdominal:     General: There is no distension.     Palpations: Abdomen is soft. There is no hepatomegaly, splenomegaly or mass.     Tenderness: There is no abdominal tenderness.  Musculoskeletal:        General: Normal range of motion.     Cervical back: Normal range of motion and neck supple. No tenderness.     Right lower leg: No edema.     Left lower leg: No edema.  Lymphadenopathy:     Cervical: No cervical adenopathy.     Upper Body:     Right upper body: No supraclavicular or axillary adenopathy.     Left upper body: No supraclavicular or axillary adenopathy.     Lower Body: No right inguinal  adenopathy. No left inguinal adenopathy.  Skin:    General: Skin is warm and dry.     Coloration: Skin is not jaundiced.     Findings: No rash.  Neurological:     Mental Status: She is alert and oriented to person, place, and time.     Cranial Nerves: No cranial nerve deficit.  Psychiatric:        Mood and Affect: Mood normal.        Behavior: Behavior normal.        Thought Content: Thought content normal.   SCANS:  CT scams of her chest/abdomen/pelvis revealed the following: FINDINGS: LUNG BASES: 19 mm right lower lobe pulmonary nodule, image 3:2. The pulmonary nodule is lobular in  appearance, with questionable peripheral ground-glass attenuation. This was previously evaluated with chest CT 07/22/2023.  LIVER: 7 mm hypodensity adjacent to the gallbladder fossa, best visualized on image 27:2, unchanged in appearance.  GALLBLADDER: The gallbladder is contracted, with multiple portosystemic collaterals coursing along the gallbladder base and body. No radiopaque gallstones are appreciated. There is mild pericholecystic inflammatory change/stranding, similar to the prior examination.  BILIARY: No biliary dilatation.  SPLEEN: Enlarged.Aaron Aas  PANCREAS: Unremarkable.  KIDNEYS: Nonspecific bilateral perinephric stranding, otherwise unremarkable.  URINARY BLADDER: Grossly unremarkable.  REPRODUCTIVE: Unremarkable.  ADRENAL GLANDS: Unremarkable.  VASCULATURE: Abdominal aorta is unremarkable. Stable eccentric filling defect extending from the main portal vein to the right lateral aspect of the superior mesenteric vein.  LYMPH NODES AND RETROPERITONEUM: There is no lymphadenopathy in the abdomen and pelvis.  STOMACH: Gastroesophageal varices are present.  SMALL INTESTINE: Small duodenal diverticulum  COLON: Postoperative changes of ascending colectomy. The transverse, descending, and sigmoid colon are unremarkable.  PELVIS: Moderate free pelvic fluid measuring 9.6 x 8.0 cm. Mild pelvic and bilateral paracolic gutter inflammatory change. Small volume of fluid within Morrison's pouch.  MUSCULOSKELETAL: No suspicious osseous lesions are seen.  There are postoperative changes of the anterior abdominal wall.  Small volume of perihepatic ascites.  There is a small fat and venous collateral containing midline incisional hernia measuring a proximally 19 mm.  IMPRESSION: Interval development of a moderate volume of free pelvic fluid.  Stable subcentimeter hepatic hypodense lesion adjacent to the gallbladder fossa.  Stable right lower lung pulmonary nodule, consistent with  metastatic disease.  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  Gastroesophageal varices.  LABS:      Latest Ref Rng & Units 09/20/2023    3:35 PM 08/30/2023    9:23 AM 08/09/2023    9:41 AM  CBC  WBC 4.0 - 10.5 K/uL 2.0  2.9  3.2   Hemoglobin 12.0 - 15.0 g/dL 40.9  81.1  91.4   Hematocrit 36.0 - 46.0 % 33.3  35.8  38.4   Platelets 150 - 400 K/uL 32  41  48       Latest Ref Rng & Units 08/30/2023    9:23 AM 08/09/2023    9:41 AM 08/02/2023   11:50 AM  CMP  Glucose 70 - 99 mg/dL 782  956  213   BUN 6 - 20 mg/dL 12  15  12    Creatinine 0.44 - 1.00 mg/dL 0.86  5.78  4.69   Sodium 135 - 145 mmol/L 141  143  145   Potassium 3.5 - 5.1 mmol/L 4.4  4.2  3.4   Chloride 98 - 111 mmol/L 102  105  114   CO2 22 - 32 mmol/L 29  28  23    Calcium   8.9 - 10.3 mg/dL 9.4  9.5  7.0   Total Protein 6.5 - 8.1 g/dL 5.7  6.3  4.3   Total Bilirubin 0.0 - 1.2 mg/dL 0.4  0.3  0.3   Alkaline Phos 38 - 126 U/L 198  228  121   AST 15 - 41 U/L 37  32  16   ALT 0 - 44 U/L 42  19  11    ASSESSMENT & PLAN:  Assessment/Plan:  A 58 y.o. female with metastatic colon cancer. In clinic today, I went over her CT scan images with her, for which she could see no new prominent findings to explain her intermittent nausea/vomiting.  I really do not believe her splenomegaly, mild perihepatic fluid or clots in her portal venous system are behind her intermittent nausea/vomiting.  Clinically, she is doing okay today.  She will proceed with her 23rd cycle of FOLFOX/Avastin  today, which she now receives every 3 weeks.  With respect to her thrombocytopenia, I will give her 1 unit of platelets this week.  Otherwise, I will see her back in 3 weeks before she heads into her 24th cycle of FOLFOX/Avastin .  The patient understands all the plans discussed today and is in agreement with them.  Carry Weesner Felicia Horde, MD

## 2023-09-20 ENCOUNTER — Inpatient Hospital Stay

## 2023-09-20 ENCOUNTER — Encounter: Payer: Self-pay | Admitting: Oncology

## 2023-09-20 ENCOUNTER — Inpatient Hospital Stay (HOSPITAL_BASED_OUTPATIENT_CLINIC_OR_DEPARTMENT_OTHER): Admitting: Oncology

## 2023-09-20 ENCOUNTER — Other Ambulatory Visit: Payer: Self-pay | Admitting: Pharmacist

## 2023-09-20 ENCOUNTER — Other Ambulatory Visit: Payer: Self-pay

## 2023-09-20 VITALS — BP 123/86 | HR 99 | Temp 99.3°F | Resp 16 | Ht 68.0 in | Wt 151.5 lb

## 2023-09-20 DIAGNOSIS — C189 Malignant neoplasm of colon, unspecified: Secondary | ICD-10-CM | POA: Diagnosis not present

## 2023-09-20 DIAGNOSIS — Z5189 Encounter for other specified aftercare: Secondary | ICD-10-CM | POA: Diagnosis not present

## 2023-09-20 DIAGNOSIS — C7800 Secondary malignant neoplasm of unspecified lung: Secondary | ICD-10-CM

## 2023-09-20 DIAGNOSIS — D696 Thrombocytopenia, unspecified: Secondary | ICD-10-CM

## 2023-09-20 DIAGNOSIS — R112 Nausea with vomiting, unspecified: Secondary | ICD-10-CM | POA: Diagnosis not present

## 2023-09-20 DIAGNOSIS — C182 Malignant neoplasm of ascending colon: Secondary | ICD-10-CM

## 2023-09-20 DIAGNOSIS — C786 Secondary malignant neoplasm of retroperitoneum and peritoneum: Secondary | ICD-10-CM | POA: Diagnosis not present

## 2023-09-20 DIAGNOSIS — Z5111 Encounter for antineoplastic chemotherapy: Secondary | ICD-10-CM | POA: Diagnosis not present

## 2023-09-20 DIAGNOSIS — C7971 Secondary malignant neoplasm of right adrenal gland: Secondary | ICD-10-CM | POA: Diagnosis not present

## 2023-09-20 DIAGNOSIS — C7931 Secondary malignant neoplasm of brain: Secondary | ICD-10-CM | POA: Diagnosis not present

## 2023-09-20 DIAGNOSIS — C787 Secondary malignant neoplasm of liver and intrahepatic bile duct: Secondary | ICD-10-CM | POA: Diagnosis not present

## 2023-09-20 DIAGNOSIS — C7802 Secondary malignant neoplasm of left lung: Secondary | ICD-10-CM | POA: Diagnosis not present

## 2023-09-20 DIAGNOSIS — C7801 Secondary malignant neoplasm of right lung: Secondary | ICD-10-CM | POA: Diagnosis not present

## 2023-09-20 DIAGNOSIS — I864 Gastric varices: Secondary | ICD-10-CM | POA: Diagnosis not present

## 2023-09-20 LAB — CBC WITH DIFFERENTIAL (CANCER CENTER ONLY)
Abs Immature Granulocytes: 0 10*3/uL (ref 0.00–0.07)
Basophils Absolute: 0 10*3/uL (ref 0.0–0.1)
Basophils Relative: 1 %
Eosinophils Absolute: 0 10*3/uL (ref 0.0–0.5)
Eosinophils Relative: 1 %
HCT: 33.3 % — ABNORMAL LOW (ref 36.0–46.0)
Hemoglobin: 11 g/dL — ABNORMAL LOW (ref 12.0–15.0)
Immature Granulocytes: 0 %
Immature Platelet Fraction: 2 % (ref 1.2–8.6)
Lymphocytes Relative: 17 %
Lymphs Abs: 0.3 10*3/uL — ABNORMAL LOW (ref 0.7–4.0)
MCH: 33 pg (ref 26.0–34.0)
MCHC: 33 g/dL (ref 30.0–36.0)
MCV: 100 fL (ref 80.0–100.0)
Monocytes Absolute: 0.1 10*3/uL (ref 0.1–1.0)
Monocytes Relative: 4 %
Neutro Abs: 1.5 10*3/uL — ABNORMAL LOW (ref 1.7–7.7)
Neutrophils Relative %: 77 %
Platelet Count: 32 10*3/uL — ABNORMAL LOW (ref 150–400)
RBC: 3.33 MIL/uL — ABNORMAL LOW (ref 3.87–5.11)
RDW: 15.3 % (ref 11.5–15.5)
WBC Count: 2 10*3/uL — ABNORMAL LOW (ref 4.0–10.5)
nRBC: 0 % (ref 0.0–0.2)
nRBC: 0 /100{WBCs}

## 2023-09-20 LAB — TOTAL PROTEIN, URINE DIPSTICK: Protein, ur: NEGATIVE mg/dL

## 2023-09-20 MED ORDER — SODIUM CHLORIDE 0.9 % IV SOLN: Freq: Once | INTRAVENOUS | Status: AC

## 2023-09-20 MED ORDER — SODIUM CHLORIDE 0.9 % IV SOLN
150.0000 mg | Freq: Once | INTRAVENOUS | Status: AC
  Administered 2023-09-20: 150 mg via INTRAVENOUS
  Filled 2023-09-20: qty 150

## 2023-09-20 MED ORDER — DEXTROSE 5 % IV SOLN: Freq: Once | INTRAVENOUS | Status: DC

## 2023-09-20 MED ORDER — HEPARIN SOD (PORK) LOCK FLUSH 100 UNIT/ML IV SOLN: 500.0000 [IU] | Freq: Once | INTRAVENOUS | Status: DC | PRN

## 2023-09-20 MED ORDER — SODIUM CHLORIDE 0.9 % IV SOLN
2400.0000 mg/m2 | INTRAVENOUS | Status: DC
  Administered 2023-09-20: 4350 mg via INTRAVENOUS
  Filled 2023-09-20: qty 87

## 2023-09-20 MED ORDER — FLUOROURACIL CHEMO INJECTION 2.5 GM/50ML
400.0000 mg/m2 | Freq: Once | INTRAVENOUS | Status: AC
  Administered 2023-09-20: 700 mg via INTRAVENOUS
  Filled 2023-09-20: qty 14

## 2023-09-20 MED ORDER — DEXTROSE 5 % IV SOLN: Freq: Once | INTRAVENOUS | Status: AC

## 2023-09-20 MED ORDER — SODIUM CHLORIDE 0.9 % IV SOLN
Freq: Once | INTRAVENOUS | Status: AC
Start: 2023-09-20 — End: 2023-09-20

## 2023-09-20 MED ORDER — DIPHENHYDRAMINE HCL 50 MG/ML IJ SOLN
25.0000 mg | Freq: Once | INTRAMUSCULAR | Status: AC
  Administered 2023-09-20: 25 mg via INTRAVENOUS
  Filled 2023-09-20: qty 1

## 2023-09-20 MED ORDER — DEXAMETHASONE SODIUM PHOSPHATE 10 MG/ML IJ SOLN
10.0000 mg | Freq: Once | INTRAMUSCULAR | Status: AC
  Administered 2023-09-20: 10 mg via INTRAVENOUS
  Filled 2023-09-20: qty 1

## 2023-09-20 MED ORDER — PALONOSETRON HCL INJECTION 0.25 MG/5ML
0.2500 mg | Freq: Once | INTRAVENOUS | Status: AC
  Administered 2023-09-20: 0.25 mg via INTRAVENOUS
  Filled 2023-09-20: qty 5

## 2023-09-20 MED ORDER — SODIUM CHLORIDE 0.9 % IV SOLN
5.0000 mg/kg | Freq: Once | INTRAVENOUS | Status: AC
  Administered 2023-09-20: 350 mg via INTRAVENOUS
  Filled 2023-09-20: qty 14

## 2023-09-20 MED ORDER — SODIUM CHLORIDE 0.9% FLUSH: 10.0000 mL | INTRAVENOUS | Status: DC | PRN

## 2023-09-20 MED ORDER — OXALIPLATIN CHEMO INJECTION 50 MG/10ML
63.7500 mg/m2 | Freq: Once | INTRAVENOUS | Status: AC
  Administered 2023-09-20: 115 mg via INTRAVENOUS
  Filled 2023-09-20: qty 20

## 2023-09-20 MED ORDER — FAMOTIDINE IN NACL 20-0.9 MG/50ML-% IV SOLN
20.0000 mg | Freq: Once | INTRAVENOUS | Status: AC
  Administered 2023-09-20: 20 mg via INTRAVENOUS
  Filled 2023-09-20: qty 50

## 2023-09-20 NOTE — Progress Notes (Signed)
 Labs at [facility] 09/16/23 Hgb=12 Plt=42 ANC=1.27 Scr=1.20 AST=32 ALT=33 Total Bili=0.5

## 2023-09-20 NOTE — Progress Notes (Signed)
 OK TO TREAT WITH PLATELETS 42 AND ANC OF 1.27

## 2023-09-20 NOTE — Patient Instructions (Signed)
 The chemotherapy medication bag should finish at 46 hours, 96 hours, or 7 days. For example, if your pump is scheduled for 46 hours and it was put on at 4:00 p.m., it should finish at 2:00 p.m. the day it is scheduled to come off regardless of your appointment time.     Estimated time to finish at 1500 .   If the display on your pump reads "Low Volume" and it is beeping, take the batteries out of the pump and come to the cancer center for it to be taken off.   If the pump alarms go off prior to the pump reading "Low Volume" then call [phone] and someone can assist you.  If the plunger comes out and the chemotherapy medication is leaking out, please use your home chemo spill kit to clean up the spill. Do NOT use paper towels or other household products.  If you have problems or questions regarding your pump, please call either [phone] (24 hours a day) or the cancer center Monday-Friday 8:00 a.m.- 4:30 p.m. at the clinic number and we will assist you. If you are unable to get assistance, then go to the nearest Emergency Department and ask the staff to contact the IV team for assistance.

## 2023-09-21 ENCOUNTER — Encounter: Payer: Self-pay | Admitting: Oncology

## 2023-09-22 ENCOUNTER — Inpatient Hospital Stay

## 2023-09-22 VITALS — BP 134/89 | HR 99 | Temp 98.6°F | Resp 16

## 2023-09-22 DIAGNOSIS — C182 Malignant neoplasm of ascending colon: Secondary | ICD-10-CM

## 2023-09-22 DIAGNOSIS — R112 Nausea with vomiting, unspecified: Secondary | ICD-10-CM | POA: Diagnosis not present

## 2023-09-22 DIAGNOSIS — C7800 Secondary malignant neoplasm of unspecified lung: Secondary | ICD-10-CM

## 2023-09-22 DIAGNOSIS — C7971 Secondary malignant neoplasm of right adrenal gland: Secondary | ICD-10-CM | POA: Diagnosis not present

## 2023-09-22 DIAGNOSIS — Z5189 Encounter for other specified aftercare: Secondary | ICD-10-CM | POA: Diagnosis not present

## 2023-09-22 DIAGNOSIS — C7931 Secondary malignant neoplasm of brain: Secondary | ICD-10-CM | POA: Diagnosis not present

## 2023-09-22 DIAGNOSIS — C787 Secondary malignant neoplasm of liver and intrahepatic bile duct: Secondary | ICD-10-CM | POA: Diagnosis not present

## 2023-09-22 DIAGNOSIS — I864 Gastric varices: Secondary | ICD-10-CM | POA: Diagnosis not present

## 2023-09-22 DIAGNOSIS — C786 Secondary malignant neoplasm of retroperitoneum and peritoneum: Secondary | ICD-10-CM | POA: Diagnosis not present

## 2023-09-22 DIAGNOSIS — C7801 Secondary malignant neoplasm of right lung: Secondary | ICD-10-CM | POA: Diagnosis not present

## 2023-09-22 DIAGNOSIS — C7802 Secondary malignant neoplasm of left lung: Secondary | ICD-10-CM | POA: Diagnosis not present

## 2023-09-22 DIAGNOSIS — D696 Thrombocytopenia, unspecified: Secondary | ICD-10-CM | POA: Diagnosis not present

## 2023-09-22 DIAGNOSIS — C189 Malignant neoplasm of colon, unspecified: Secondary | ICD-10-CM

## 2023-09-22 DIAGNOSIS — Z5111 Encounter for antineoplastic chemotherapy: Secondary | ICD-10-CM | POA: Diagnosis not present

## 2023-09-22 MED ORDER — SODIUM CHLORIDE 0.9% FLUSH: 10.0000 mL | INTRAVENOUS | Status: DC | PRN

## 2023-09-22 MED ORDER — SODIUM CHLORIDE 0.9% FLUSH
10.0000 mL | INTRAVENOUS | Status: DC | PRN
  Administered 2023-09-22 (×2): 10 mL

## 2023-09-22 MED ORDER — SODIUM CHLORIDE 0.9% IV SOLUTION
250.0000 mL | INTRAVENOUS | Status: DC
  Administered 2023-09-22: 250 mL via INTRAVENOUS

## 2023-09-22 NOTE — Progress Notes (Signed)
 Dime size skin tear noted to lower neck area- dressing with adhesive bandaid.

## 2023-09-22 NOTE — Progress Notes (Signed)
 Confirmed with blood bank that patient is ok to receive 0 positive platelets

## 2023-09-22 NOTE — Patient Instructions (Signed)
 Managing Chemotherapy Side Effects, Adult Chemotherapy is a treatment that uses medicine to kill cancer cells. However, in addition to killing cancer cells, the medicines can also damage healthy cells. The damage to healthy cells can lead to side effects. The exact side effects depend on the specific medicines used. Most of the side effects of chemotherapy go away once treatment is finished. Until then, work closely with your health care providers and take an active role in managing your side effects. What are common side effects of chemotherapy? Increased risk of infection, bruising, or bleeding. Nausea and vomiting. Constipation or diarrhea. Loss of appetite. Hair loss. Mouth or throat sores. Tiredness (fatigue). Tingling, pain, or numbness in the hands and feet. Dry, sensitive, itchy, or sore skin. Sleep disturbances, such as excessive sleepiness. Confusion, anxiety, or mood swings. Memory changes. How to manage the side effects of chemotherapy Medicines Take over-the-counter and prescription medicines only as told by your health care provider. Talk with your health care provider before taking vitamins, herbs, supplements, or over-the-counter medicines. Some of these can interfere with chemotherapy. Activity Get plenty of rest. Get regular exercise by doing activities such as walking, gentle yoga, or tai chi. Return to your normal activities as told by your health care provider. Ask your health care provider what activities are safe for you. Eating and drinking  Talk to a dietitian about what you should eat and drink during cancer treatment. Drink enough fluid to keep your urine pale yellow. If you have side effects that affect eating, these tips may help: Eat smaller meals and snacks often. Drink high-nutrition and high-calorie shakes or supplements. Choose bland and soft foods that are easy to eat. Do not eat foods that are hot, spicy, or hard to swallow. Do not eat raw or  undercooked meat, eggs, or seafood. Always wash fresh fruits and vegetables well before eating them. Skin care If you have sore or itchy skin: Wear soft, comfortable clothing. Apply creams and ointments to your skin as told by your health care provider. If you lose your hair, consider wearing a wig, hat, or scarf to cover your head. You may want to have someone shave your head as you start to lose hair. During outdoor activities, protect your head and skin from the sun by using sunscreen with an SPF of 30 or higher or by wearing protective clothing and a hat. Meet with a hair and skin care specialist for makeup and skin care tips. Apply sunscreen to your scalp as told by your health care provider. General tips Learn as much as you can about your condition. If you are struggling emotionally, talk with a mental health care provider or join a support group. Keep all follow-up visits. This is important. How to prevent infection and bleeding Chemotherapy may lower your blood counts and put you at risk for infection and bleeding. Here are some ways to help prevent problems. Vaccines Talk to your health care provider about vaccines. You should not get any live vaccines, such as the polio, MMR, chickenpox, and shingles vaccines until your health care provider says that it is safe to do so. Do not be around people who have had live vaccines for as long as your health care provider recommends. Make sure you get a yearly flu shot. People who will be near you should also get a yearly flu shot. Social activity Stay away from crowded places where you could be exposed to germs. Do not be around people who may be sick or  people who have symptoms of a fever until they have been fever-free for at least 24 hours. Do not share food, cups, straws, or utensils with other people. Wear a mask when outside the home if your blood counts are low. Cleanliness  Wash your hands often for at least 20 seconds. Also make  sure that other members of your household wash their hands often. Brush your teeth twice daily using a soft toothbrush. Use mouth rinse only as told by your health care provider. Take a bath or shower daily unless your health care provider gives different instructions. General tips Take your temperature regularly, especially if you have chills or feel warm. Check with your health care provider: Before you travel. Before you have a dental procedure. Before you use a swimming pool, hot tub, or swim in a lake or ocean. If you get chemotherapy through an IV or port, check the site every day for signs of infection. Check for redness, swelling, pain, fluid, and warmth. Avoid activities that put you at risk for injury or bleeding. Use an electric razor to shave instead of a blade. Questions to ask your health care provider What are the most common side effects of my treatment? How will they affect my daily life? What can I do to manage them? What are some possible long-term side effects? What are possible complications? What support services are available? What number can I call with questions or concerns? Where to find support Cancer affects the entire family. Find out what family support resources are available from your cancer treatment center. For more support, turn to: Your cancer care team. Friends and family. Your religious community. Other people with cancer. Community-based or online support groups. Where to find more information National Cancer Institute: www.cancer.gov American Cancer Society: www.cancer.org Contact a health care provider if: You bleed or bruise more often. You notice blood in your urine or stool. You have any of these symptoms: A skin rash, or dry or itchy skin. A headache or stiff neck. Cold or flu symptoms. A cough. Persistent nausea or vomiting. Persistent diarrhea. Frequent urination, burning when passing urine, or foul-smelling urine. You cannot eat  because of mouth or throat pain. You are sad, confused, anxious, or depressed. Get help right away if: You have any of these symptoms: A fever or chills. Your health care provider should know about this right away. Redness, swelling, pain, fluid, or warmth near an IV site. Bleeding that you cannot stop. A seizure. You cannot swallow. You have chest pain. You have trouble breathing. A family member or caregiver should get help right away if you have a sudden or unusual change in behavior. These symptoms may be an emergency. Get help right away. Call 911. Do not wait to see if the symptoms will go away. Do not drive yourself to the hospital. Summary Chemotherapy is a treatment that uses medicine to kill cancer cells and can cause side effects. The specific side effects depend on the specific medicines used. Learn as much as you can about your condition. Ask about side effects to watch for and how to treat them. Seek out support and resources from others. Find out what family support resources are available from your cancer treatment center. Let your health care provider know if you notice any new, unusual, or worsening symptoms, especially fever or chills. This information is not intended to replace advice given to you by your health care provider. Make sure you discuss any questions you have with your health  care provider. Document Revised: 05/08/2021 Document Reviewed: 05/08/2021 Elsevier Patient Education  2024 [employer].

## 2023-09-23 LAB — BPAM PLATELET PHERESIS
Blood Product Expiration Date: 202504252359
ISSUE DATE / TIME: 202504231136
Unit Type and Rh: 5100

## 2023-09-23 LAB — PREPARE PLATELET PHERESIS: Unit division: 0

## 2023-09-27 ENCOUNTER — Inpatient Hospital Stay

## 2023-09-27 VITALS — BP 123/79 | HR 95 | Temp 98.1°F | Resp 18

## 2023-09-27 DIAGNOSIS — R112 Nausea with vomiting, unspecified: Secondary | ICD-10-CM | POA: Diagnosis not present

## 2023-09-27 DIAGNOSIS — C786 Secondary malignant neoplasm of retroperitoneum and peritoneum: Secondary | ICD-10-CM | POA: Diagnosis not present

## 2023-09-27 DIAGNOSIS — C182 Malignant neoplasm of ascending colon: Secondary | ICD-10-CM

## 2023-09-27 DIAGNOSIS — I864 Gastric varices: Secondary | ICD-10-CM | POA: Diagnosis not present

## 2023-09-27 DIAGNOSIS — C787 Secondary malignant neoplasm of liver and intrahepatic bile duct: Secondary | ICD-10-CM | POA: Diagnosis not present

## 2023-09-27 DIAGNOSIS — C7971 Secondary malignant neoplasm of right adrenal gland: Secondary | ICD-10-CM | POA: Diagnosis not present

## 2023-09-27 DIAGNOSIS — C7931 Secondary malignant neoplasm of brain: Secondary | ICD-10-CM | POA: Diagnosis not present

## 2023-09-27 DIAGNOSIS — C189 Malignant neoplasm of colon, unspecified: Secondary | ICD-10-CM | POA: Diagnosis not present

## 2023-09-27 DIAGNOSIS — C7802 Secondary malignant neoplasm of left lung: Secondary | ICD-10-CM | POA: Diagnosis not present

## 2023-09-27 DIAGNOSIS — Z5189 Encounter for other specified aftercare: Secondary | ICD-10-CM | POA: Diagnosis not present

## 2023-09-27 DIAGNOSIS — Z5111 Encounter for antineoplastic chemotherapy: Secondary | ICD-10-CM | POA: Diagnosis not present

## 2023-09-27 DIAGNOSIS — D696 Thrombocytopenia, unspecified: Secondary | ICD-10-CM | POA: Diagnosis not present

## 2023-09-27 DIAGNOSIS — C7801 Secondary malignant neoplasm of right lung: Secondary | ICD-10-CM | POA: Diagnosis not present

## 2023-09-27 MED ORDER — FILGRASTIM-SNDZ 480 MCG/0.8ML IJ SOSY
480.0000 ug | PREFILLED_SYRINGE | Freq: Once | INTRAMUSCULAR | Status: AC
  Administered 2023-09-27: 480 ug via SUBCUTANEOUS
  Filled 2023-09-27: qty 0.8

## 2023-09-27 NOTE — Patient Instructions (Signed)
 Filgrastim Injection What is this medication? FILGRASTIM (fil GRA stim) lowers the risk of infection in people who are receiving chemotherapy. It works by Systems analyst make more white blood cells, which protects your body from infection. It may also be used to help people who have been exposed to high doses of radiation. It can be used to help prepare your body before a stem cell transplant. It works by helping your bone marrow make and release stem cells into the blood. This medicine may be used for other purposes; ask your health care provider or pharmacist if you have questions. COMMON BRAND NAME(S): Neupogen, Nivestym, Nypozi, Releuko, Zarxio What should I tell my care team before I take this medication? They need to know if you have any of these conditions: History of blood diseases, such as sickle cell anemia Kidney disease Recent or ongoing radiation An unusual or allergic reaction to filgrastim, pegfilgrastim, latex, rubber, other medications, foods, dyes, or preservatives Pregnant or trying to get pregnant Breast-feeding How should I use this medication? This medication is injected under the skin or into a vein. It is usually given by your care team in a hospital or clinic setting. It may be given at home. If you get this medication at home, you will be taught how to prepare and give it. Use exactly as directed. Take it as directed on the prescription label at the same time every day. Keep taking it unless your care team tells you to stop. It is important that you put your used needles and syringes in a special sharps container. Do not put them in a trash can. If you do not have a sharps container, call your pharmacist or care team to get one. This medication comes with INSTRUCTIONS FOR USE. Ask your pharmacist for directions on how to use this medication. Read the information carefully. Talk to your pharmacist or care team if you have questions. Talk to your care team about the use of  this medication in children. While it may be prescribed for children for selected conditions, precautions do apply. Overdosage: If you think you have taken too much of this medicine contact a poison control center or emergency room at once. NOTE: This medicine is only for you. Do not share this medicine with others. What if I miss a dose? It is important not to miss any doses. Talk to your care team about what to do if you miss a dose. What may interact with this medication? Medications that may cause a release of neutrophils, such as lithium This list may not describe all possible interactions. Give your health care provider a list of all the medicines, herbs, non-prescription drugs, or dietary supplements you use. Also tell them if you smoke, drink alcohol, or use illegal drugs. Some items may interact with your medicine. What should I watch for while using this medication? Your condition will be monitored carefully while you are receiving this medication. You may need bloodwork while taking this medication. Talk to your care team about your risk of cancer. You may be more at risk for certain types of cancer if you take this medication. What side effects may I notice from receiving this medication? Side effects that you should report to your care team as soon as possible: Allergic reactions--skin rash, itching, hives, swelling of the face, lips, tongue, or throat Capillary leak syndrome--stomach or muscle pain, unusual weakness or fatigue, feeling faint or lightheaded, decrease in the amount of urine, swelling of the ankles, hands,  or feet, trouble breathing High white blood cell level--fever, fatigue, trouble breathing, night sweats, change in vision, weight loss Inflammation of the aorta--fever, fatigue, back, chest, or stomach pain, severe headache Kidney injury (glomerulonephritis)--decrease in the amount of urine, red or dark brown urine, foamy or bubbly urine, swelling of the ankles, hands,  or feet Shortness of breath or trouble breathing Spleen injury--pain in upper left stomach or shoulder Unusual bruising or bleeding Side effects that usually do not require medical attention (report to your care team if they continue or are bothersome): Back pain Bone pain Fatigue Fever Headache Nausea This list may not describe all possible side effects. Call your doctor for medical advice about side effects. You may report side effects to FDA at 1-800-FDA-1088. Where should I keep my medication? Keep out of the reach of children and pets. Keep this medication in the original packaging until you are ready to take it. Protect from light. See product for storage information. Each product may have different instructions. Get rid of any unused medication after the expiration date. To get rid of medications that are no longer needed or have expired: Take the medication to a medications take-back program. Check with your pharmacy or law enforcement to find a location. If you cannot return the medication, ask your pharmacist or care team how to get rid of this medication safely. NOTE: This sheet is a summary. It may not cover all possible information. If you have questions about this medicine, talk to your doctor, pharmacist, or health care provider.  2024 Elsevier/[employer] Standard (2021-10-09 00:00:00)

## 2023-09-28 ENCOUNTER — Inpatient Hospital Stay

## 2023-09-28 VITALS — BP 107/83 | HR 93 | Temp 97.8°F | Resp 18

## 2023-09-28 DIAGNOSIS — C7931 Secondary malignant neoplasm of brain: Secondary | ICD-10-CM | POA: Diagnosis not present

## 2023-09-28 DIAGNOSIS — C189 Malignant neoplasm of colon, unspecified: Secondary | ICD-10-CM

## 2023-09-28 DIAGNOSIS — C787 Secondary malignant neoplasm of liver and intrahepatic bile duct: Secondary | ICD-10-CM | POA: Diagnosis not present

## 2023-09-28 DIAGNOSIS — C786 Secondary malignant neoplasm of retroperitoneum and peritoneum: Secondary | ICD-10-CM | POA: Diagnosis not present

## 2023-09-28 DIAGNOSIS — I864 Gastric varices: Secondary | ICD-10-CM | POA: Diagnosis not present

## 2023-09-28 DIAGNOSIS — C7802 Secondary malignant neoplasm of left lung: Secondary | ICD-10-CM | POA: Diagnosis not present

## 2023-09-28 DIAGNOSIS — C7971 Secondary malignant neoplasm of right adrenal gland: Secondary | ICD-10-CM | POA: Diagnosis not present

## 2023-09-28 DIAGNOSIS — Z5189 Encounter for other specified aftercare: Secondary | ICD-10-CM | POA: Diagnosis not present

## 2023-09-28 DIAGNOSIS — D696 Thrombocytopenia, unspecified: Secondary | ICD-10-CM | POA: Diagnosis not present

## 2023-09-28 DIAGNOSIS — Z5111 Encounter for antineoplastic chemotherapy: Secondary | ICD-10-CM | POA: Diagnosis not present

## 2023-09-28 DIAGNOSIS — R112 Nausea with vomiting, unspecified: Secondary | ICD-10-CM | POA: Diagnosis not present

## 2023-09-28 DIAGNOSIS — C7801 Secondary malignant neoplasm of right lung: Secondary | ICD-10-CM | POA: Diagnosis not present

## 2023-09-28 DIAGNOSIS — C182 Malignant neoplasm of ascending colon: Secondary | ICD-10-CM

## 2023-09-28 MED ORDER — FILGRASTIM-SNDZ 480 MCG/0.8ML IJ SOSY
480.0000 ug | PREFILLED_SYRINGE | Freq: Once | INTRAMUSCULAR | Status: AC
  Administered 2023-09-28: 480 ug via SUBCUTANEOUS
  Filled 2023-09-28: qty 0.8

## 2023-09-28 NOTE — Patient Instructions (Signed)
 Filgrastim Injection What is this medication? FILGRASTIM (fil GRA stim) lowers the risk of infection in people who are receiving chemotherapy. It works by Systems analyst make more white blood cells, which protects your body from infection. It may also be used to help people who have been exposed to high doses of radiation. It can be used to help prepare your body before a stem cell transplant. It works by helping your bone marrow make and release stem cells into the blood. This medicine may be used for other purposes; ask your health care provider or pharmacist if you have questions. COMMON BRAND NAME(S): Neupogen, Nivestym, Nypozi, Releuko, Zarxio What should I tell my care team before I take this medication? They need to know if you have any of these conditions: History of blood diseases, such as sickle cell anemia Kidney disease Recent or ongoing radiation An unusual or allergic reaction to filgrastim, pegfilgrastim, latex, rubber, other medications, foods, dyes, or preservatives Pregnant or trying to get pregnant Breast-feeding How should I use this medication? This medication is injected under the skin or into a vein. It is usually given by your care team in a hospital or clinic setting. It may be given at home. If you get this medication at home, you will be taught how to prepare and give it. Use exactly as directed. Take it as directed on the prescription label at the same time every day. Keep taking it unless your care team tells you to stop. It is important that you put your used needles and syringes in a special sharps container. Do not put them in a trash can. If you do not have a sharps container, call your pharmacist or care team to get one. This medication comes with INSTRUCTIONS FOR USE. Ask your pharmacist for directions on how to use this medication. Read the information carefully. Talk to your pharmacist or care team if you have questions. Talk to your care team about the use of  this medication in children. While it may be prescribed for children for selected conditions, precautions do apply. Overdosage: If you think you have taken too much of this medicine contact a poison control center or emergency room at once. NOTE: This medicine is only for you. Do not share this medicine with others. What if I miss a dose? It is important not to miss any doses. Talk to your care team about what to do if you miss a dose. What may interact with this medication? Medications that may cause a release of neutrophils, such as lithium This list may not describe all possible interactions. Give your health care provider a list of all the medicines, herbs, non-prescription drugs, or dietary supplements you use. Also tell them if you smoke, drink alcohol, or use illegal drugs. Some items may interact with your medicine. What should I watch for while using this medication? Your condition will be monitored carefully while you are receiving this medication. You may need bloodwork while taking this medication. Talk to your care team about your risk of cancer. You may be more at risk for certain types of cancer if you take this medication. What side effects may I notice from receiving this medication? Side effects that you should report to your care team as soon as possible: Allergic reactions--skin rash, itching, hives, swelling of the face, lips, tongue, or throat Capillary leak syndrome--stomach or muscle pain, unusual weakness or fatigue, feeling faint or lightheaded, decrease in the amount of urine, swelling of the ankles, hands,  or feet, trouble breathing High white blood cell level--fever, fatigue, trouble breathing, night sweats, change in vision, weight loss Inflammation of the aorta--fever, fatigue, back, chest, or stomach pain, severe headache Kidney injury (glomerulonephritis)--decrease in the amount of urine, red or dark brown urine, foamy or bubbly urine, swelling of the ankles, hands,  or feet Shortness of breath or trouble breathing Spleen injury--pain in upper left stomach or shoulder Unusual bruising or bleeding Side effects that usually do not require medical attention (report to your care team if they continue or are bothersome): Back pain Bone pain Fatigue Fever Headache Nausea This list may not describe all possible side effects. Call your doctor for medical advice about side effects. You may report side effects to FDA at 1-800-FDA-1088. Where should I keep my medication? Keep out of the reach of children and pets. Keep this medication in the original packaging until you are ready to take it. Protect from light. See product for storage information. Each product may have different instructions. Get rid of any unused medication after the expiration date. To get rid of medications that are no longer needed or have expired: Take the medication to a medications take-back program. Check with your pharmacy or law enforcement to find a location. If you cannot return the medication, ask your pharmacist or care team how to get rid of this medication safely. NOTE: This sheet is a summary. It may not cover all possible information. If you have questions about this medicine, talk to your doctor, pharmacist, or health care provider.  2024 Elsevier/[employer] Standard (2021-10-09 00:00:00)

## 2023-09-29 ENCOUNTER — Inpatient Hospital Stay

## 2023-09-29 ENCOUNTER — Encounter: Payer: Self-pay | Admitting: Oncology

## 2023-09-29 VITALS — BP 125/81 | HR 93 | Temp 98.3°F

## 2023-09-29 DIAGNOSIS — C7931 Secondary malignant neoplasm of brain: Secondary | ICD-10-CM | POA: Diagnosis not present

## 2023-09-29 DIAGNOSIS — Z5189 Encounter for other specified aftercare: Secondary | ICD-10-CM | POA: Diagnosis not present

## 2023-09-29 DIAGNOSIS — C189 Malignant neoplasm of colon, unspecified: Secondary | ICD-10-CM

## 2023-09-29 DIAGNOSIS — C7802 Secondary malignant neoplasm of left lung: Secondary | ICD-10-CM | POA: Diagnosis not present

## 2023-09-29 DIAGNOSIS — I864 Gastric varices: Secondary | ICD-10-CM | POA: Diagnosis not present

## 2023-09-29 DIAGNOSIS — C787 Secondary malignant neoplasm of liver and intrahepatic bile duct: Secondary | ICD-10-CM | POA: Diagnosis not present

## 2023-09-29 DIAGNOSIS — R112 Nausea with vomiting, unspecified: Secondary | ICD-10-CM | POA: Diagnosis not present

## 2023-09-29 DIAGNOSIS — Z5111 Encounter for antineoplastic chemotherapy: Secondary | ICD-10-CM | POA: Diagnosis not present

## 2023-09-29 DIAGNOSIS — D696 Thrombocytopenia, unspecified: Secondary | ICD-10-CM | POA: Diagnosis not present

## 2023-09-29 DIAGNOSIS — C786 Secondary malignant neoplasm of retroperitoneum and peritoneum: Secondary | ICD-10-CM | POA: Diagnosis not present

## 2023-09-29 DIAGNOSIS — C7801 Secondary malignant neoplasm of right lung: Secondary | ICD-10-CM | POA: Diagnosis not present

## 2023-09-29 DIAGNOSIS — C182 Malignant neoplasm of ascending colon: Secondary | ICD-10-CM

## 2023-09-29 DIAGNOSIS — C7971 Secondary malignant neoplasm of right adrenal gland: Secondary | ICD-10-CM | POA: Diagnosis not present

## 2023-09-29 MED ORDER — FILGRASTIM-SNDZ 480 MCG/0.8ML IJ SOSY
480.0000 ug | PREFILLED_SYRINGE | Freq: Once | INTRAMUSCULAR | Status: AC
  Administered 2023-09-29: 480 ug via SUBCUTANEOUS
  Filled 2023-09-29: qty 0.8

## 2023-09-29 NOTE — Patient Instructions (Signed)
 Filgrastim Injection What is this medication? FILGRASTIM (fil GRA stim) lowers the risk of infection in people who are receiving chemotherapy. It works by Systems analyst make more white blood cells, which protects your body from infection. It may also be used to help people who have been exposed to high doses of radiation. It can be used to help prepare your body before a stem cell transplant. It works by helping your bone marrow make and release stem cells into the blood. This medicine may be used for other purposes; ask your health care provider or pharmacist if you have questions. COMMON BRAND NAME(S): Neupogen, Nivestym, Nypozi, Releuko, Zarxio What should I tell my care team before I take this medication? They need to know if you have any of these conditions: History of blood diseases, such as sickle cell anemia Kidney disease Recent or ongoing radiation An unusual or allergic reaction to filgrastim, pegfilgrastim, latex, rubber, other medications, foods, dyes, or preservatives Pregnant or trying to get pregnant Breast-feeding How should I use this medication? This medication is injected under the skin or into a vein. It is usually given by your care team in a hospital or clinic setting. It may be given at home. If you get this medication at home, you will be taught how to prepare and give it. Use exactly as directed. Take it as directed on the prescription label at the same time every day. Keep taking it unless your care team tells you to stop. It is important that you put your used needles and syringes in a special sharps container. Do not put them in a trash can. If you do not have a sharps container, call your pharmacist or care team to get one. This medication comes with INSTRUCTIONS FOR USE. Ask your pharmacist for directions on how to use this medication. Read the information carefully. Talk to your pharmacist or care team if you have questions. Talk to your care team about the use of  this medication in children. While it may be prescribed for children for selected conditions, precautions do apply. Overdosage: If you think you have taken too much of this medicine contact a poison control center or emergency room at once. NOTE: This medicine is only for you. Do not share this medicine with others. What if I miss a dose? It is important not to miss any doses. Talk to your care team about what to do if you miss a dose. What may interact with this medication? Medications that may cause a release of neutrophils, such as lithium This list may not describe all possible interactions. Give your health care provider a list of all the medicines, herbs, non-prescription drugs, or dietary supplements you use. Also tell them if you smoke, drink alcohol, or use illegal drugs. Some items may interact with your medicine. What should I watch for while using this medication? Your condition will be monitored carefully while you are receiving this medication. You may need bloodwork while taking this medication. Talk to your care team about your risk of cancer. You may be more at risk for certain types of cancer if you take this medication. What side effects may I notice from receiving this medication? Side effects that you should report to your care team as soon as possible: Allergic reactions--skin rash, itching, hives, swelling of the face, lips, tongue, or throat Capillary leak syndrome--stomach or muscle pain, unusual weakness or fatigue, feeling faint or lightheaded, decrease in the amount of urine, swelling of the ankles, hands,  or feet, trouble breathing High white blood cell level--fever, fatigue, trouble breathing, night sweats, change in vision, weight loss Inflammation of the aorta--fever, fatigue, back, chest, or stomach pain, severe headache Kidney injury (glomerulonephritis)--decrease in the amount of urine, red or dark brown urine, foamy or bubbly urine, swelling of the ankles, hands,  or feet Shortness of breath or trouble breathing Spleen injury--pain in upper left stomach or shoulder Unusual bruising or bleeding Side effects that usually do not require medical attention (report to your care team if they continue or are bothersome): Back pain Bone pain Fatigue Fever Headache Nausea This list may not describe all possible side effects. Call your doctor for medical advice about side effects. You may report side effects to FDA at 1-800-FDA-1088. Where should I keep my medication? Keep out of the reach of children and pets. Keep this medication in the original packaging until you are ready to take it. Protect from light. See product for storage information. Each product may have different instructions. Get rid of any unused medication after the expiration date. To get rid of medications that are no longer needed or have expired: Take the medication to a medications take-back program. Check with your pharmacy or law enforcement to find a location. If you cannot return the medication, ask your pharmacist or care team how to get rid of this medication safely. NOTE: This sheet is a summary. It may not cover all possible information. If you have questions about this medicine, talk to your doctor, pharmacist, or health care provider.  2024 Elsevier/[employer] Standard (2021-10-09 00:00:00)

## 2023-09-30 ENCOUNTER — Encounter: Payer: Self-pay | Admitting: Oncology

## 2023-10-10 NOTE — Progress Notes (Unsigned)
 [facility]  [address] [place],  [place]  [address] [phone]  Clinic Day:  10/08/2023  Referring physician: Deloria Fetch, MD  HISTORY OF PRESENT ILLNESS:  The patient is a 58 y.o. female with metastatic colon cancer, which includes spread of disease to her abdominal cavity, lungs, brain and right adrenal gland.  Since her last visit, the patient has been doing fairly well.  She does complain of steady fatigue, likely related to her numerous years of chemotherapy.  Although she still has diarrhea, it has not been prominent over the past few weeks.  She denies having any abdominal pain at this time or other new GI symptoms/findings which concern her for overt signs of disease progression.    With respect to her colon cancer history, she is status post a right hemicolectomy in early October 2018, followed by 12 cycles of adjuvant FOLFOX chemotherapy, which were completed in April 2019.  In December 2021, she underwent a left cerebellar metastasectomy, whose pathology was consistent with metastatic colon cancer.  Tumor testing did come back MMR normal. CT scans revealed evidence of her cancer being in multiple locations, for which she took 40 cycles of FOLFIRI/Avastin .  As follow-up scans revealed liver metastasis, she was placed back on FOLFOX/Avastin , for which she continues to take.  PHYSICAL EXAM:  Blood pressure 114/82, pulse 96, temperature 98.2 F (36.8 C), temperature source Oral, resp. rate 14, height 5\' 8"  (1.727 m), weight 149 lb (67.6 kg), last menstrual period 02/22/2019, SpO2 97%. Body mass index is 22.66 kg/m.  Performance status (ECOG): 1 - Symptomatic but completely ambulatory  Physical Exam Vitals and nursing note reviewed.  Constitutional:      General: She is not in acute distress.    Appearance: Normal appearance.  HENT:     Head: Normocephalic and atraumatic.     Mouth/Throat:     Mouth: Mucous membranes are moist.      Pharynx: Oropharynx is clear. No oropharyngeal exudate or posterior oropharyngeal erythema.  Eyes:     General: No scleral icterus.    Extraocular Movements: Extraocular movements intact.     Conjunctiva/sclera: Conjunctivae normal.     Pupils: Pupils are equal, round, and reactive to light.  Cardiovascular:     Rate and Rhythm: Normal rate and regular rhythm.     Heart sounds: Normal heart sounds. No murmur heard.    No friction rub. No gallop.  Pulmonary:     Effort: Pulmonary effort is normal.     Breath sounds: Normal breath sounds. No wheezing, rhonchi or rales.  Abdominal:     General: There is no distension.     Palpations: Abdomen is soft. There is no hepatomegaly, splenomegaly or mass.     Tenderness: There is no abdominal tenderness.  Musculoskeletal:        General: Normal range of motion.     Cervical back: Normal range of motion and neck supple. No tenderness.     Right lower leg: No edema.     Left lower leg: No edema.  Lymphadenopathy:     Cervical: No cervical adenopathy.     Upper Body:     Right upper body: No supraclavicular or axillary adenopathy.     Left upper body: No supraclavicular or axillary adenopathy.     Lower Body: No right inguinal adenopathy. No left inguinal adenopathy.  Skin:    General: Skin is warm and dry.     Coloration: Skin is not  jaundiced.     Findings: No rash.  Neurological:     Mental Status: She is alert and oriented to person, place, and time.     Cranial Nerves: No cranial nerve deficit.  Psychiatric:        Mood and Affect: Mood normal.        Behavior: Behavior normal.        Thought Content: Thought content normal.    LABS:      Latest Ref Rng & Units 10/11/2023   10:27 AM 09/20/2023    3:35 PM 08/30/2023    9:23 AM  CBC  WBC 4.0 - 10.5 K/uL 3.2  2.0  2.9   Hemoglobin 12.0 - 15.0 g/dL 16.1  09.6  04.5   Hematocrit 36.0 - 46.0 % 37.5  33.3  35.8   Platelets 150 - 400 K/uL 41  32  41       Latest Ref Rng & Units  10/11/2023   10:27 AM 08/30/2023    9:23 AM 08/09/2023    9:41 AM  CMP  Glucose 70 - 99 mg/dL 409  811  914   BUN 6 - 20 mg/dL 17  12  15    Creatinine 0.44 - 1.00 mg/dL 7.82  9.56  2.13   Sodium 135 - 145 mmol/L 141  141  143   Potassium 3.5 - 5.1 mmol/L 4.7  4.4  4.2   Chloride 98 - 111 mmol/L 102  102  105   CO2 22 - 32 mmol/L 29  29  28    Calcium  8.9 - 10.3 mg/dL 9.8  9.4  9.5   Total Protein 6.5 - 8.1 g/dL 5.9  5.7  6.3   Total Bilirubin 0.0 - 1.2 mg/dL 0.3  0.4  0.3   Alkaline Phos 38 - 126 U/L 188  198  228   AST 15 - 41 U/L 26  37  32   ALT 0 - 44 U/L 16  42  19    ASSESSMENT & PLAN:  Assessment/Plan:  A 58 y.o. female with metastatic colon cancer. She will proceed with her 24th cycle of FOLFOX/Avastin  today, which she now receives every 3 weeks.  With respect to her thrombocytopenia, as her platelets are above 40 she does not have any bleeding issues, a platelet pheresis will not be done this week.  Clinically, he appears to be doing well.  I will see her back in 3 weeks before she heads into her 25th cycle of FOLFOX/Avastin .  The patient understands all the plans discussed today and is in agreement with them.  Shannon [name] Shannon Horde, MD

## 2023-10-11 ENCOUNTER — Encounter: Payer: Self-pay | Admitting: Oncology

## 2023-10-11 ENCOUNTER — Inpatient Hospital Stay

## 2023-10-11 ENCOUNTER — Inpatient Hospital Stay: Attending: Oncology | Admitting: Oncology

## 2023-10-11 VITALS — BP 114/82 | HR 96 | Temp 98.2°F | Resp 14 | Ht 68.0 in | Wt 149.0 lb

## 2023-10-11 DIAGNOSIS — C7802 Secondary malignant neoplasm of left lung: Secondary | ICD-10-CM | POA: Insufficient documentation

## 2023-10-11 DIAGNOSIS — C786 Secondary malignant neoplasm of retroperitoneum and peritoneum: Secondary | ICD-10-CM | POA: Insufficient documentation

## 2023-10-11 DIAGNOSIS — Z5189 Encounter for other specified aftercare: Secondary | ICD-10-CM | POA: Insufficient documentation

## 2023-10-11 DIAGNOSIS — C189 Malignant neoplasm of colon, unspecified: Secondary | ICD-10-CM

## 2023-10-11 DIAGNOSIS — C7971 Secondary malignant neoplasm of right adrenal gland: Secondary | ICD-10-CM | POA: Insufficient documentation

## 2023-10-11 DIAGNOSIS — Z803 Family history of malignant neoplasm of breast: Secondary | ICD-10-CM | POA: Insufficient documentation

## 2023-10-11 DIAGNOSIS — C787 Secondary malignant neoplasm of liver and intrahepatic bile duct: Secondary | ICD-10-CM | POA: Insufficient documentation

## 2023-10-11 DIAGNOSIS — D696 Thrombocytopenia, unspecified: Secondary | ICD-10-CM | POA: Insufficient documentation

## 2023-10-11 DIAGNOSIS — C7931 Secondary malignant neoplasm of brain: Secondary | ICD-10-CM | POA: Diagnosis not present

## 2023-10-11 DIAGNOSIS — C7800 Secondary malignant neoplasm of unspecified lung: Secondary | ICD-10-CM

## 2023-10-11 DIAGNOSIS — C182 Malignant neoplasm of ascending colon: Secondary | ICD-10-CM

## 2023-10-11 DIAGNOSIS — C7801 Secondary malignant neoplasm of right lung: Secondary | ICD-10-CM | POA: Diagnosis not present

## 2023-10-11 DIAGNOSIS — Z5111 Encounter for antineoplastic chemotherapy: Secondary | ICD-10-CM | POA: Insufficient documentation

## 2023-10-11 LAB — CMP (CANCER CENTER ONLY)
ALT: 16 U/L (ref 0–44)
AST: 26 U/L (ref 15–41)
Albumin: 3.4 g/dL — ABNORMAL LOW (ref 3.5–5.0)
Alkaline Phosphatase: 188 U/L — ABNORMAL HIGH (ref 38–126)
Anion gap: 10 (ref 5–15)
BUN: 17 mg/dL (ref 6–20)
CO2: 29 mmol/L (ref 22–32)
Calcium: 9.8 mg/dL (ref 8.9–10.3)
Chloride: 102 mmol/L (ref 98–111)
Creatinine: 1.25 mg/dL — ABNORMAL HIGH (ref 0.44–1.00)
GFR, Estimated: 51 mL/min — ABNORMAL LOW (ref >60)
Glucose, Bld: 253 mg/dL — ABNORMAL HIGH (ref 70–99)
Potassium: 4.7 mmol/L (ref 3.5–5.1)
Sodium: 141 mmol/L (ref 135–145)
Total Bilirubin: 0.3 mg/dL (ref 0.0–1.2)
Total Protein: 5.9 g/dL — ABNORMAL LOW (ref 6.5–8.1)

## 2023-10-11 LAB — CBC WITH DIFFERENTIAL (CANCER CENTER ONLY)
Abs Immature Granulocytes: 0.01 10*3/uL (ref 0.00–0.07)
Basophils Absolute: 0 10*3/uL (ref 0.0–0.1)
Basophils Relative: 0 %
Eosinophils Absolute: 0 10*3/uL (ref 0.0–0.5)
Eosinophils Relative: 1 %
HCT: 37.5 % (ref 36.0–46.0)
Hemoglobin: 11.9 g/dL — ABNORMAL LOW (ref 12.0–15.0)
Immature Granulocytes: 0 %
Lymphocytes Relative: 25 %
Lymphs Abs: 0.8 10*3/uL (ref 0.7–4.0)
MCH: 32.4 pg (ref 26.0–34.0)
MCHC: 31.7 g/dL (ref 30.0–36.0)
MCV: 102.2 fL — ABNORMAL HIGH (ref 80.0–100.0)
Monocytes Absolute: 0.3 10*3/uL (ref 0.1–1.0)
Monocytes Relative: 10 %
Neutro Abs: 2 10*3/uL (ref 1.7–7.7)
Neutrophils Relative %: 64 %
Platelet Count: 41 10*3/uL — ABNORMAL LOW (ref 150–400)
RBC: 3.67 MIL/uL — ABNORMAL LOW (ref 3.87–5.11)
RDW: 15.1 % (ref 11.5–15.5)
WBC Count: 3.2 10*3/uL — ABNORMAL LOW (ref 4.0–10.5)
nRBC: 0 % (ref 0.0–0.2)

## 2023-10-11 LAB — TOTAL PROTEIN, URINE DIPSTICK: Protein, ur: NEGATIVE mg/dL

## 2023-10-11 MED ORDER — SODIUM CHLORIDE 0.9 % IV SOLN
150.0000 mg | Freq: Once | INTRAVENOUS | Status: AC
  Administered 2023-10-11: 150 mg via INTRAVENOUS
  Filled 2023-10-11: qty 150

## 2023-10-11 MED ORDER — SODIUM CHLORIDE 0.9% FLUSH: 10.0000 mL | INTRAVENOUS | Status: DC | PRN

## 2023-10-11 MED ORDER — SODIUM CHLORIDE 0.9 % IV SOLN
2400.0000 mg/m2 | INTRAVENOUS | Status: DC
  Administered 2023-10-11: 4350 mg via INTRAVENOUS
  Filled 2023-10-11: qty 87

## 2023-10-11 MED ORDER — SODIUM CHLORIDE 0.9 % IV SOLN
5.0000 mg/kg | Freq: Once | INTRAVENOUS | Status: AC
  Administered 2023-10-11: 350 mg via INTRAVENOUS
  Filled 2023-10-11: qty 14

## 2023-10-11 MED ORDER — DEXTROSE 5 % IV SOLN
63.7500 mg/m2 | Freq: Once | INTRAVENOUS | Status: AC
  Administered 2023-10-11: 115 mg via INTRAVENOUS
  Filled 2023-10-11: qty 23

## 2023-10-11 MED ORDER — DEXTROSE 5 % IV SOLN: Freq: Once | INTRAVENOUS | Status: AC

## 2023-10-11 MED ORDER — FLUOROURACIL CHEMO INJECTION 2.5 GM/50ML
400.0000 mg/m2 | Freq: Once | INTRAVENOUS | Status: AC
  Administered 2023-10-11: 700 mg via INTRAVENOUS
  Filled 2023-10-11: qty 14

## 2023-10-11 MED ORDER — FAMOTIDINE IN NACL 20-0.9 MG/50ML-% IV SOLN
20.0000 mg | Freq: Once | INTRAVENOUS | Status: AC
  Administered 2023-10-11: 20 mg via INTRAVENOUS
  Filled 2023-10-11: qty 50

## 2023-10-11 MED ORDER — SODIUM CHLORIDE 0.9 % IV SOLN: Freq: Once | INTRAVENOUS | Status: AC

## 2023-10-11 MED ORDER — PALONOSETRON HCL INJECTION 0.25 MG/5ML
0.2500 mg | Freq: Once | INTRAVENOUS | Status: AC
  Administered 2023-10-11: 0.25 mg via INTRAVENOUS
  Filled 2023-10-11: qty 5

## 2023-10-11 MED ORDER — DEXAMETHASONE SODIUM PHOSPHATE 10 MG/ML IJ SOLN
10.0000 mg | Freq: Once | INTRAMUSCULAR | Status: AC
  Administered 2023-10-11: 10 mg via INTRAVENOUS
  Filled 2023-10-11: qty 1

## 2023-10-11 MED ORDER — DIPHENHYDRAMINE HCL 50 MG/ML IJ SOLN
25.0000 mg | Freq: Once | INTRAMUSCULAR | Status: AC
  Administered 2023-10-11: 25 mg via INTRAVENOUS
  Filled 2023-10-11: qty 1

## 2023-10-11 MED ORDER — DEXTROSE 5 % IV SOLN: Freq: Once | INTRAVENOUS | Status: DC

## 2023-10-11 MED ORDER — HEPARIN SOD (PORK) LOCK FLUSH 100 UNIT/ML IV SOLN: 500.0000 [IU] | Freq: Once | INTRAVENOUS | Status: DC | PRN

## 2023-10-11 NOTE — Patient Instructions (Signed)
 The chemotherapy medication bag should finish at 46 hours. For example, if your pump is scheduled for 46 hours and it was put on at 4:00 p.m., it should finish at 2:00 p.m. the day it is scheduled to come off regardless of your appointment time.     Estimated time to finish at 2pm.   If the display on your pump reads "Low Volume" and it is beeping, take the batteries out of the pump and come to the cancer center for it to be taken off.   If the pump alarms go off prior to the pump reading "Low Volume" then call [phone] and someone can assist you.  If the plunger comes out and the chemotherapy medication is leaking out, please use your home chemo spill kit to clean up the spill. Do NOT use paper towels or other household products.  If you have problems or questions regarding your pump, please call either [phone] (24 hours a day) or the cancer center Monday-Friday 8:00 a.m.- 4:30 p.m. at the clinic number and we will assist you. If you are unable to get assistance, then go to the nearest Emergency Department and ask the staff to contact the IV team for assistance.

## 2023-10-11 NOTE — Progress Notes (Signed)
 Ok to treat with platelets of 41 per Dr. Harles Lied

## 2023-10-12 ENCOUNTER — Inpatient Hospital Stay: Admission: RE | Admit: 2023-10-12 | Source: Ambulatory Visit

## 2023-10-13 ENCOUNTER — Inpatient Hospital Stay

## 2023-10-13 VITALS — BP 97/72 | HR 101 | Temp 98.2°F | Resp 18 | Ht 68.0 in | Wt 149.0 lb

## 2023-10-13 DIAGNOSIS — C189 Malignant neoplasm of colon, unspecified: Secondary | ICD-10-CM

## 2023-10-13 DIAGNOSIS — Z5111 Encounter for antineoplastic chemotherapy: Secondary | ICD-10-CM | POA: Diagnosis not present

## 2023-10-13 DIAGNOSIS — C182 Malignant neoplasm of ascending colon: Secondary | ICD-10-CM

## 2023-10-13 DIAGNOSIS — C7800 Secondary malignant neoplasm of unspecified lung: Secondary | ICD-10-CM

## 2023-10-13 MED ORDER — SODIUM CHLORIDE 0.9% FLUSH
10.0000 mL | INTRAVENOUS | Status: DC | PRN
Start: 2023-10-13 — End: 2023-10-13
  Administered 2023-10-13: 10 mL

## 2023-10-13 MED ORDER — HEPARIN SOD (PORK) LOCK FLUSH 100 UNIT/ML IV SOLN
500.0000 [IU] | Freq: Once | INTRAVENOUS | Status: AC | PRN
  Administered 2023-10-13: 500 [IU]

## 2023-10-13 NOTE — Patient Instructions (Signed)
 The chemotherapy medication bag should finish at 46 hours, 96 hours, or 7 days. For example, if your pump is scheduled for 46 hours and it was put on at 4:00 p.m., it should finish at 2:00 p.m. the day it is scheduled to come off regardless of your appointment time.     Estimated time to finish at 1317.   If the display on your pump reads "Low Volume" and it is beeping, take the batteries out of the pump and come to the cancer center for it to be taken off.   If the pump alarms go off prior to the pump reading "Low Volume" then call [phone] and someone can assist you.  If the plunger comes out and the chemotherapy medication is leaking out, please use your home chemo spill kit to clean up the spill. Do NOT use paper towels or other household products.  If you have problems or questions regarding your pump, please call either [phone] (24 hours a day) or the cancer center Monday-Friday 8:00 a.m.- 4:30 p.m. at the clinic number and we will assist you. If you are unable to get assistance, then go to the nearest Emergency Department and ask the staff to contact the IV team for assistance.   [facility] - A DEPT OF [facility]  Discharge Instructions: Thank you for choosing [facility] to provide your oncology and hematology care.  If you have a lab appointment with the Cancer Center, please go directly to the Cancer Center and check in at the registration area.   Wear comfortable clothing and clothing appropriate for easy access to any Portacath or PICC line.   We strive to give you quality time with your provider. You may need to reschedule your appointment if you arrive late (15 or more minutes).  Arriving late affects you and other patients whose appointments are after yours.  Also, if you miss three or more appointments without notifying the office, you may be dismissed from the clinic at the provider's discretion.      For prescription  refill requests, have your pharmacy contact our office and allow 72 hours for refills to be completed.    Today you received the following chemotherapy and/or immunotherapy agents [id number]      To help prevent nausea and vomiting after your treatment, we encourage you to take your nausea medication as directed.  BELOW ARE SYMPTOMS THAT SHOULD BE REPORTED IMMEDIATELY: *FEVER GREATER THAN 100.4 F (38 C) OR HIGHER *CHILLS OR SWEATING *NAUSEA AND VOMITING THAT IS NOT CONTROLLED WITH YOUR NAUSEA MEDICATION *UNUSUAL SHORTNESS OF BREATH *UNUSUAL BRUISING OR BLEEDING *URINARY PROBLEMS (pain or burning when urinating, or frequent urination) *BOWEL PROBLEMS (unusual diarrhea, constipation, pain near the anus) TENDERNESS IN MOUTH AND THROAT WITH OR WITHOUT PRESENCE OF ULCERS (sore throat, sores in mouth, or a toothache) UNUSUAL RASH, SWELLING OR PAIN  UNUSUAL VAGINAL DISCHARGE OR ITCHING   Items with * indicate a potential emergency and should be followed up as soon as possible or go to the Emergency Department if any problems should occur.  Please show the CHEMOTHERAPY ALERT CARD or IMMUNOTHERAPY ALERT CARD at check-in to the Emergency Department and triage nurse.  Should you have questions after your visit or need to cancel or reschedule your appointment, please contact [facility] [place] - A DEPT OF [facility]  Dept: [phone]  and follow the prompts.  Office hours are 8:00 a.m. to 4:30 p.m.  Monday - Friday. Please note that voicemails left after 4:00 p.m. may not be returned until the following business day.  We are closed weekends and major holidays. You have access to a nurse at all times for urgent questions. Please call the main number to the clinic Dept: [phone] and follow the prompts.  For any non-urgent questions, you may also contact your provider using MyChart. We now offer e-Visits for anyone 30 and older to request care online for non-urgent  symptoms. For details visit [email or web address].   Also download the MyChart app! Go to the app store, search "MyChart", open the app, select [facility], and log in with your MyChart username and password.

## 2023-10-18 ENCOUNTER — Ambulatory Visit: Admitting: Internal Medicine

## 2023-10-18 ENCOUNTER — Inpatient Hospital Stay

## 2023-10-18 ENCOUNTER — Encounter

## 2023-10-18 VITALS — BP 136/72 | HR 93 | Temp 97.9°F | Resp 18 | Ht 68.0 in | Wt 150.0 lb

## 2023-10-18 DIAGNOSIS — C7800 Secondary malignant neoplasm of unspecified lung: Secondary | ICD-10-CM

## 2023-10-18 DIAGNOSIS — C182 Malignant neoplasm of ascending colon: Secondary | ICD-10-CM

## 2023-10-18 DIAGNOSIS — Z5111 Encounter for antineoplastic chemotherapy: Secondary | ICD-10-CM | POA: Diagnosis not present

## 2023-10-18 DIAGNOSIS — C189 Malignant neoplasm of colon, unspecified: Secondary | ICD-10-CM

## 2023-10-18 DIAGNOSIS — C787 Secondary malignant neoplasm of liver and intrahepatic bile duct: Secondary | ICD-10-CM

## 2023-10-18 MED ORDER — FILGRASTIM-SNDZ 480 MCG/0.8ML IJ SOSY
480.0000 ug | PREFILLED_SYRINGE | Freq: Once | INTRAMUSCULAR | Status: AC
Start: 2023-10-18 — End: 2023-10-18
  Administered 2023-10-18: 480 ug via SUBCUTANEOUS
  Filled 2023-10-18: qty 0.8

## 2023-10-18 NOTE — Patient Instructions (Signed)
 Filgrastim Injection What is this medication? FILGRASTIM (fil GRA stim) lowers the risk of infection in people who are receiving chemotherapy. It works by Systems analyst make more white blood cells, which protects your body from infection. It may also be used to help people who have been exposed to high doses of radiation. It can be used to help prepare your body before a stem cell transplant. It works by helping your bone marrow make and release stem cells into the blood. This medicine may be used for other purposes; ask your health care provider or pharmacist if you have questions. COMMON BRAND NAME(S): Neupogen, Nivestym, Nypozi, Releuko, Zarxio What should I tell my care team before I take this medication? They need to know if you have any of these conditions: History of blood diseases, such as sickle cell anemia Kidney disease Recent or ongoing radiation An unusual or allergic reaction to filgrastim, pegfilgrastim, latex, rubber, other medications, foods, dyes, or preservatives Pregnant or trying to get pregnant Breast-feeding How should I use this medication? This medication is injected under the skin or into a vein. It is usually given by your care team in a hospital or clinic setting. It may be given at home. If you get this medication at home, you will be taught how to prepare and give it. Use exactly as directed. Take it as directed on the prescription label at the same time every day. Keep taking it unless your care team tells you to stop. It is important that you put your used needles and syringes in a special sharps container. Do not put them in a trash can. If you do not have a sharps container, call your pharmacist or care team to get one. This medication comes with INSTRUCTIONS FOR USE. Ask your pharmacist for directions on how to use this medication. Read the information carefully. Talk to your pharmacist or care team if you have questions. Talk to your care team about the use of  this medication in children. While it may be prescribed for children for selected conditions, precautions do apply. Overdosage: If you think you have taken too much of this medicine contact a poison control center or emergency room at once. NOTE: This medicine is only for you. Do not share this medicine with others. What if I miss a dose? It is important not to miss any doses. Talk to your care team about what to do if you miss a dose. What may interact with this medication? Medications that may cause a release of neutrophils, such as lithium This list may not describe all possible interactions. Give your health care provider a list of all the medicines, herbs, non-prescription drugs, or dietary supplements you use. Also tell them if you smoke, drink alcohol, or use illegal drugs. Some items may interact with your medicine. What should I watch for while using this medication? Your condition will be monitored carefully while you are receiving this medication. You may need bloodwork while taking this medication. Talk to your care team about your risk of cancer. You may be more at risk for certain types of cancer if you take this medication. What side effects may I notice from receiving this medication? Side effects that you should report to your care team as soon as possible: Allergic reactions--skin rash, itching, hives, swelling of the face, lips, tongue, or throat Capillary leak syndrome--stomach or muscle pain, unusual weakness or fatigue, feeling faint or lightheaded, decrease in the amount of urine, swelling of the ankles, hands,  or feet, trouble breathing High white blood cell level--fever, fatigue, trouble breathing, night sweats, change in vision, weight loss Inflammation of the aorta--fever, fatigue, back, chest, or stomach pain, severe headache Kidney injury (glomerulonephritis)--decrease in the amount of urine, red or dark brown urine, foamy or bubbly urine, swelling of the ankles, hands,  or feet Shortness of breath or trouble breathing Spleen injury--pain in upper left stomach or shoulder Unusual bruising or bleeding Side effects that usually do not require medical attention (report to your care team if they continue or are bothersome): Back pain Bone pain Fatigue Fever Headache Nausea This list may not describe all possible side effects. Call your doctor for medical advice about side effects. You may report side effects to FDA at 1-800-FDA-1088. Where should I keep my medication? Keep out of the reach of children and pets. Keep this medication in the original packaging until you are ready to take it. Protect from light. See product for storage information. Each product may have different instructions. Get rid of any unused medication after the expiration date. To get rid of medications that are no longer needed or have expired: Take the medication to a medications take-back program. Check with your pharmacy or law enforcement to find a location. If you cannot return the medication, ask your pharmacist or care team how to get rid of this medication safely. NOTE: This sheet is a summary. It may not cover all possible information. If you have questions about this medicine, talk to your doctor, pharmacist, or health care provider.  2024 Elsevier/[employer] Standard (2021-10-09 00:00:00)

## 2023-10-19 ENCOUNTER — Ambulatory Visit
Admission: RE | Admit: 2023-10-19 | Discharge: 2023-10-19 | Disposition: A | Discharge status: Home / Self Care | Source: Ambulatory Visit | Attending: Internal Medicine | Admitting: Internal Medicine

## 2023-10-19 ENCOUNTER — Inpatient Hospital Stay

## 2023-10-19 VITALS — BP 148/88 | HR 95 | Temp 98.1°F | Resp 18

## 2023-10-19 DIAGNOSIS — C7931 Secondary malignant neoplasm of brain: Secondary | ICD-10-CM

## 2023-10-19 DIAGNOSIS — Z5111 Encounter for antineoplastic chemotherapy: Secondary | ICD-10-CM | POA: Diagnosis not present

## 2023-10-19 DIAGNOSIS — C189 Malignant neoplasm of colon, unspecified: Secondary | ICD-10-CM

## 2023-10-19 DIAGNOSIS — C182 Malignant neoplasm of ascending colon: Secondary | ICD-10-CM

## 2023-10-19 MED ORDER — FILGRASTIM-SNDZ 480 MCG/0.8ML IJ SOSY
480.0000 ug | PREFILLED_SYRINGE | Freq: Once | INTRAMUSCULAR | Status: AC
  Administered 2023-10-19: 480 ug via SUBCUTANEOUS
  Filled 2023-10-19: qty 0.8

## 2023-10-19 MED ORDER — GADOPICLENOL 0.5 MMOL/ML IV SOLN
7.5000 mL | Freq: Once | INTRAVENOUS | Status: AC | PRN
Start: 2023-10-19 — End: 2023-10-19
  Administered 2023-10-19: 7.5 mL via INTRAVENOUS

## 2023-10-19 NOTE — Patient Instructions (Signed)
 Filgrastim Injection What is this medication? FILGRASTIM (fil GRA stim) lowers the risk of infection in people who are receiving chemotherapy. It works by Systems analyst make more white blood cells, which protects your body from infection. It may also be used to help people who have been exposed to high doses of radiation. It can be used to help prepare your body before a stem cell transplant. It works by helping your bone marrow make and release stem cells into the blood. This medicine may be used for other purposes; ask your health care provider or pharmacist if you have questions. COMMON BRAND NAME(S): Neupogen, Nivestym, Nypozi, Releuko, Zarxio What should I tell my care team before I take this medication? They need to know if you have any of these conditions: History of blood diseases, such as sickle cell anemia Kidney disease Recent or ongoing radiation An unusual or allergic reaction to filgrastim, pegfilgrastim, latex, rubber, other medications, foods, dyes, or preservatives Pregnant or trying to get pregnant Breast-feeding How should I use this medication? This medication is injected under the skin or into a vein. It is usually given by your care team in a hospital or clinic setting. It may be given at home. If you get this medication at home, you will be taught how to prepare and give it. Use exactly as directed. Take it as directed on the prescription label at the same time every day. Keep taking it unless your care team tells you to stop. It is important that you put your used needles and syringes in a special sharps container. Do not put them in a trash can. If you do not have a sharps container, call your pharmacist or care team to get one. This medication comes with INSTRUCTIONS FOR USE. Ask your pharmacist for directions on how to use this medication. Read the information carefully. Talk to your pharmacist or care team if you have questions. Talk to your care team about the use of  this medication in children. While it may be prescribed for children for selected conditions, precautions do apply. Overdosage: If you think you have taken too much of this medicine contact a poison control center or emergency room at once. NOTE: This medicine is only for you. Do not share this medicine with others. What if I miss a dose? It is important not to miss any doses. Talk to your care team about what to do if you miss a dose. What may interact with this medication? Medications that may cause a release of neutrophils, such as lithium This list may not describe all possible interactions. Give your health care provider a list of all the medicines, herbs, non-prescription drugs, or dietary supplements you use. Also tell them if you smoke, drink alcohol, or use illegal drugs. Some items may interact with your medicine. What should I watch for while using this medication? Your condition will be monitored carefully while you are receiving this medication. You may need bloodwork while taking this medication. Talk to your care team about your risk of cancer. You may be more at risk for certain types of cancer if you take this medication. What side effects may I notice from receiving this medication? Side effects that you should report to your care team as soon as possible: Allergic reactions--skin rash, itching, hives, swelling of the face, lips, tongue, or throat Capillary leak syndrome--stomach or muscle pain, unusual weakness or fatigue, feeling faint or lightheaded, decrease in the amount of urine, swelling of the ankles, hands,  or feet, trouble breathing High white blood cell level--fever, fatigue, trouble breathing, night sweats, change in vision, weight loss Inflammation of the aorta--fever, fatigue, back, chest, or stomach pain, severe headache Kidney injury (glomerulonephritis)--decrease in the amount of urine, red or dark brown urine, foamy or bubbly urine, swelling of the ankles, hands,  or feet Shortness of breath or trouble breathing Spleen injury--pain in upper left stomach or shoulder Unusual bruising or bleeding Side effects that usually do not require medical attention (report to your care team if they continue or are bothersome): Back pain Bone pain Fatigue Fever Headache Nausea This list may not describe all possible side effects. Call your doctor for medical advice about side effects. You may report side effects to FDA at 1-800-FDA-1088. Where should I keep my medication? Keep out of the reach of children and pets. Keep this medication in the original packaging until you are ready to take it. Protect from light. See product for storage information. Each product may have different instructions. Get rid of any unused medication after the expiration date. To get rid of medications that are no longer needed or have expired: Take the medication to a medications take-back program. Check with your pharmacy or law enforcement to find a location. If you cannot return the medication, ask your pharmacist or care team how to get rid of this medication safely. NOTE: This sheet is a summary. It may not cover all possible information. If you have questions about this medicine, talk to your doctor, pharmacist, or health care provider.  2024 Elsevier/[employer] Standard (2021-10-09 00:00:00)

## 2023-10-20 ENCOUNTER — Inpatient Hospital Stay

## 2023-10-20 VITALS — BP 148/76 | HR 99 | Temp 97.8°F | Resp 18

## 2023-10-20 DIAGNOSIS — C787 Secondary malignant neoplasm of liver and intrahepatic bile duct: Secondary | ICD-10-CM

## 2023-10-20 DIAGNOSIS — C189 Malignant neoplasm of colon, unspecified: Secondary | ICD-10-CM

## 2023-10-20 DIAGNOSIS — Z5111 Encounter for antineoplastic chemotherapy: Secondary | ICD-10-CM | POA: Diagnosis not present

## 2023-10-20 DIAGNOSIS — C182 Malignant neoplasm of ascending colon: Secondary | ICD-10-CM

## 2023-10-20 DIAGNOSIS — C7800 Secondary malignant neoplasm of unspecified lung: Secondary | ICD-10-CM

## 2023-10-20 MED ORDER — FILGRASTIM-SNDZ 480 MCG/0.8ML IJ SOSY
480.0000 ug | PREFILLED_SYRINGE | Freq: Once | INTRAMUSCULAR | Status: AC
Start: 2023-10-20 — End: 2023-10-20
  Administered 2023-10-20: 480 ug via SUBCUTANEOUS
  Filled 2023-10-20: qty 0.8

## 2023-10-20 NOTE — Patient Instructions (Signed)
 Filgrastim Injection What is this medication? FILGRASTIM (fil GRA stim) lowers the risk of infection in people who are receiving chemotherapy. It works by Systems analyst make more white blood cells, which protects your body from infection. It may also be used to help people who have been exposed to high doses of radiation. It can be used to help prepare your body before a stem cell transplant. It works by helping your bone marrow make and release stem cells into the blood. This medicine may be used for other purposes; ask your health care provider or pharmacist if you have questions. COMMON BRAND NAME(S): Neupogen, Nivestym, Nypozi, Releuko, Zarxio What should I tell my care team before I take this medication? They need to know if you have any of these conditions: History of blood diseases, such as sickle cell anemia Kidney disease Recent or ongoing radiation An unusual or allergic reaction to filgrastim, pegfilgrastim, latex, rubber, other medications, foods, dyes, or preservatives Pregnant or trying to get pregnant Breast-feeding How should I use this medication? This medication is injected under the skin or into a vein. It is usually given by your care team in a hospital or clinic setting. It may be given at home. If you get this medication at home, you will be taught how to prepare and give it. Use exactly as directed. Take it as directed on the prescription label at the same time every day. Keep taking it unless your care team tells you to stop. It is important that you put your used needles and syringes in a special sharps container. Do not put them in a trash can. If you do not have a sharps container, call your pharmacist or care team to get one. This medication comes with INSTRUCTIONS FOR USE. Ask your pharmacist for directions on how to use this medication. Read the information carefully. Talk to your pharmacist or care team if you have questions. Talk to your care team about the use of  this medication in children. While it may be prescribed for children for selected conditions, precautions do apply. Overdosage: If you think you have taken too much of this medicine contact a poison control center or emergency room at once. NOTE: This medicine is only for you. Do not share this medicine with others. What if I miss a dose? It is important not to miss any doses. Talk to your care team about what to do if you miss a dose. What may interact with this medication? Medications that may cause a release of neutrophils, such as lithium This list may not describe all possible interactions. Give your health care provider a list of all the medicines, herbs, non-prescription drugs, or dietary supplements you use. Also tell them if you smoke, drink alcohol, or use illegal drugs. Some items may interact with your medicine. What should I watch for while using this medication? Your condition will be monitored carefully while you are receiving this medication. You may need bloodwork while taking this medication. Talk to your care team about your risk of cancer. You may be more at risk for certain types of cancer if you take this medication. What side effects may I notice from receiving this medication? Side effects that you should report to your care team as soon as possible: Allergic reactions--skin rash, itching, hives, swelling of the face, lips, tongue, or throat Capillary leak syndrome--stomach or muscle pain, unusual weakness or fatigue, feeling faint or lightheaded, decrease in the amount of urine, swelling of the ankles, hands,  or feet, trouble breathing High white blood cell level--fever, fatigue, trouble breathing, night sweats, change in vision, weight loss Inflammation of the aorta--fever, fatigue, back, chest, or stomach pain, severe headache Kidney injury (glomerulonephritis)--decrease in the amount of urine, red or dark brown urine, foamy or bubbly urine, swelling of the ankles, hands,  or feet Shortness of breath or trouble breathing Spleen injury--pain in upper left stomach or shoulder Unusual bruising or bleeding Side effects that usually do not require medical attention (report to your care team if they continue or are bothersome): Back pain Bone pain Fatigue Fever Headache Nausea This list may not describe all possible side effects. Call your doctor for medical advice about side effects. You may report side effects to FDA at 1-800-FDA-1088. Where should I keep my medication? Keep out of the reach of children and pets. Keep this medication in the original packaging until you are ready to take it. Protect from light. See product for storage information. Each product may have different instructions. Get rid of any unused medication after the expiration date. To get rid of medications that are no longer needed or have expired: Take the medication to a medications take-back program. Check with your pharmacy or law enforcement to find a location. If you cannot return the medication, ask your pharmacist or care team how to get rid of this medication safely. NOTE: This sheet is a summary. It may not cover all possible information. If you have questions about this medicine, talk to your doctor, pharmacist, or health care provider.  2024 Elsevier/[employer] Standard (2021-10-09 00:00:00)

## 2023-10-22 ENCOUNTER — Encounter: Payer: Self-pay | Admitting: Oncology

## 2023-10-22 ENCOUNTER — Other Ambulatory Visit: Payer: Self-pay | Admitting: Psychiatry

## 2023-10-22 DIAGNOSIS — R530 Neoplastic (malignant) related fatigue: Secondary | ICD-10-CM

## 2023-10-22 DIAGNOSIS — G3184 Mild cognitive impairment, so stated: Secondary | ICD-10-CM

## 2023-10-22 DIAGNOSIS — F333 Major depressive disorder, recurrent, severe with psychotic symptoms: Secondary | ICD-10-CM

## 2023-10-26 ENCOUNTER — Inpatient Hospital Stay (HOSPITAL_BASED_OUTPATIENT_CLINIC_OR_DEPARTMENT_OTHER): Admitting: Internal Medicine

## 2023-10-26 VITALS — BP 124/81 | HR 91 | Temp 97.5°F | Resp 15 | Wt 149.3 lb

## 2023-10-26 DIAGNOSIS — C7931 Secondary malignant neoplasm of brain: Secondary | ICD-10-CM

## 2023-10-26 DIAGNOSIS — Z5111 Encounter for antineoplastic chemotherapy: Secondary | ICD-10-CM | POA: Diagnosis not present

## 2023-10-26 NOTE — Progress Notes (Signed)
 [facility] [address]  [place], [place] [address] [phone]   Interval Evaluation  Date of Service: 10/26/23 Patient Name: Shannon [name] Patient MRN: [id number] Patient DOB: 1966-04-12 Provider: Mamie Searles, MD  Identifying Statement:  Shannon [name] is a 58 y.o. female with Malignant neoplasm metastatic to brain [facility]) [C79.31]   Primary Cancer: Colon Adenocarcinoma, Stage IV  Oncologic History: 05/29/20: Sub-occipital craniotomy, resection by Dr. Lamon Pillow.  Path c/w colon adenocarcinoma 07/08/20: Completes fractionated SRS to resection cavity w/ Dr. Lurena Sally 10/22/20: Salvage SRS R cerebellum  Interval History:  Shannon [name] presents today for follow up after recent MRI brain.  Doesn't describe any clinical changes today.  Fatigue is still present. FOLFOX has been ongoing with Dr. Harles Lied, now having completed 24 cycles, also with zarxio  support.  Balance is still "not great" but still walking on her own, maintains full independence.  She continues to have issues with sleep, brain fog.  No other changes to medications.  H+P (07/02/20) Patient presented to medical attention at the end of December, 2021 with several weeks of progressive balance impairment.  She worsened toward the end to the point of needing full assist with walking, or being mostly bed-bound.  At the same time she developed bouts of vertigo, described as "room spinning around me", for which medical interventions were not effective.  CNS imaging demonstrated large enhancing mass in left cerebellar hemisphere with swelling and compression c/w likely brain metastasis; she underwent craniotomy and resection on 05/29/20 with Dr. Lamon Pillow.  Following surgery, her symptoms improved considerably.  At present, she is functionally intact aside from issues with short term memory, fatigue, and mood.  Visited with radiation oncology today for post-operative simulation.    Medications: Current Outpatient Medications on File Prior to Visit  Medication Sig Dispense Refill   acetaminophen  (TYLENOL ) 325 MG tablet Take 2 tablets (650 mg total) by mouth every 6 (six) hours as needed for mild pain.     atorvastatin  (LIPITOR) 10 MG tablet Take by mouth.     cariprazine  (VRAYLAR ) 3 MG capsule Take 1 capsule (3 mg total) by mouth daily. (Patient taking differently: Take 3 mg by mouth daily. 1 every other day) 90 capsule 0   CONTOUR NEXT TEST test strip 3 (three) times daily.     Dextromethorphan-buPROPion  ER (AUVELITY ) 45-105 MG TBCR Take 1 tablet twice daily 60 tablet 4   Dulaglutide  (TRULICITY ) 3 MG/0.5ML SOAJ Inject 3 mg into the skin once a week. 6 mL 1   fluvoxaMINE  (LUVOX ) 100 MG tablet Take 3 tablets (300 mg total) by mouth at bedtime. 270 tablet 0   hyoscyamine  (LEVSIN SL) 0.125 MG SL tablet Place 1 tablet (0.125 mg total) under the tongue every 6 (six) hours as needed. 45 tablet 1   insulin  aspart (NOVOLOG  FLEXPEN) 100 UNIT/ML FlexPen Inject 8 Units into the skin 3 (three) times daily with meals. 15 mL 11   insulin  glargine (LANTUS ) 100 UNIT/ML Solostar Pen Inject 26 Units into the skin daily. 15 mL 11   levothyroxine  (SYNTHROID ) 75 MCG tablet Take 1 tablet (75 mcg total) by mouth daily. 30 tablet 0   loratadine (CLARITIN) 10 MG tablet Take 10 mg by mouth daily.     LORazepam  (ATIVAN ) 0.5 MG tablet Take 1 tablet (0.5 mg total) by mouth every 8 (eight) hours as needed for anxiety. 30 tablet 3   magic mouthwash SOLN Take 10 mLs by mouth 3 (  three) times daily as needed for mouth pain.     modafinil  (PROVIGIL ) 200 MG tablet TAKE 1 & 1/2 (ONE & ONE-HALF) TABLETS BY MOUTH ONCE DAILY 45 tablet 1   Multiple Vitamin (MULTI-VITAMIN) tablet Take 1 tablet by mouth daily.     NOVOFINE PEN NEEDLE 32G X 6 MM MISC SMARTSIG:Syringe(s) SUB-Q     omeprazole (PRILOSEC) 40 MG capsule Take 40 mg by mouth daily.     ondansetron  (ZOFRAN ) 4 MG tablet Take 1 tablet (4 mg total) by mouth  every 4 (four) hours as needed for nausea. 90 tablet 3   ondansetron  (ZOFRAN -ODT) 4 MG disintegrating tablet Take 1 tablet (4 mg total) by mouth every 4 (four) hours as needed for nausea or vomiting. Dissolve on tongue 90 tablet 3   promethazine  (PHENERGAN ) 25 MG tablet Take 1 tablet (25 mg total) by mouth every 6 (six) hours as needed for nausea. 30 tablet 3   senna-docusate (SENOKOT-S) 8.6-50 MG tablet Take 2 tablets by mouth at bedtime.     sucralfate (CARAFATE) 1 g tablet Take 1 g by mouth 4 (four) times daily.     traZODone  (DESYREL ) 50 MG tablet 1-2 tablets at night as needed for sleep 180 tablet 1   No current facility-administered medications on file prior to visit.    Allergies:  Allergies  Allergen Reactions   Leucovorin  Hives, Rash and Other (See Comments)    Redness on face and neck.  Confirmed that it was the leucovorin  causing this on 11/19/21.  We are deleting this from future plan.   Clindamycin/Lincomycin Rash   Irinotecan  Rash   Penicillins Rash    Reaction: 10 years   Past Medical History:  Past Medical History:  Diagnosis Date   Anemia    Iron De   Anxiety    Blood clot in vein    Bowel obstruction (HCC)    Colon cancer (HCC) 2018   treated with surgery and chemotherapy   Depression    Diabetes mellitus without complication (HCC)    Type II   DVT (deep venous thrombosis) (HCC) 2019   behind right knee- while she wasa on chemo   GERD (gastroesophageal reflux disease)    History of blood transfusion    History of chemotherapy    History of kidney stones 2012   passed   Hypothyroidism    Obesity    Past Surgical History:  Past Surgical History:  Procedure Laterality Date   ABDOMINAL HYSTERECTOMY  05/2019   total laparoscopin with tubes and ovariers removed also.   APPENDECTOMY  05/2019   BREAST BIOPSY Right 2020   COLON SURGERY  03/2017   Colon resection   COLONOSCOPY  2019   SUBOCCIPITAL CRANIECTOMY CERVICAL LAMINECTOMY Left 05/29/2020    Procedure: Suboccipital Craniectomy for Resection of Left Cerebellar Mass;  Surgeon: Shannon Keens, MD;  Location: [facility] OR;  Service: Neurosurgery;  Laterality: Left;   Social History:  Social History   Socioeconomic History   Marital status: Widowed    Spouse name: Not on file   Number of children: Not on file   Years of education: Not on file   Highest education level: Not on file  Occupational History   Occupation: care coordinator   Tobacco Use   Smoking status: Never   Smokeless tobacco: Never  Vaping Use   Vaping status: Never Used  Substance and Sexual Activity   Alcohol use: Yes    Comment: rare   Drug use: Never   Sexual  activity: Not Currently  Other Topics Concern   Not on file  Social History Narrative   Not on file   Social Drivers of Health   Financial Resource Strain: Not on file  Food Insecurity: Not on file  Transportation Needs: Not on file  Physical Activity: Not on file  Stress: Not on file  Social Connections: Not on file  Intimate Partner Violence: Not on file   Family History:  Family History  Problem Relation Age of Onset   Irritable bowel syndrome Mother    Colon polyps Father    Breast cancer Maternal Grandmother    Diabetes Maternal Grandmother    Diabetes Maternal Grandfather    Breast cancer Paternal Grandmother    Colon polyps Maternal Uncle    Stomach cancer Neg Hx    Pancreatic cancer Neg Hx    Esophageal cancer Neg Hx    Colon cancer Neg Hx     Review of Systems: Constitutional: +Fatigue Eyes: Doesn't report blurriness of vision Ears, nose, mouth, throat, and face: Doesn't report sore throat Respiratory: Doesn't report cough, dyspnea or wheezes Cardiovascular: Doesn't report palpitation, chest discomfort  Gastrointestinal:  Doesn't report nausea, constipation, diarrhea GU: Doesn't report incontinence Skin: Doesn't report skin rashes Neurological: Per HPI Musculoskeletal: Doesn't report joint pain Behavioral/Psych: +mood  issues, depression  Physical Exam: Vitals:   10/26/23 1206  BP: 124/81  Pulse: 91  Resp: 15  Temp: (!) 97.5 F (36.4 C)  SpO2: 94%   KPS: 90. General: Alert, cooperative, pleasant, in no acute distress Head: Normal EENT: No conjunctival injection or scleral icterus.  Lungs: Resp effort normal Cardiac: Regular rate Abdomen: Non-distended abdomen Skin: No rashes cyanosis or petechiae. Extremities: No clubbing or edema  Neurologic Exam: Mental Status: Awake, alert, attentive to examiner. Oriented to self and environment. Language is fluent with intact comprehension.  Cranial Nerves: Visual acuity is grossly normal. Visual fields are full. Extra-ocular movements intact. No ptosis. Face is symmetric Motor: Tone and bulk are normal. Power is full in both arms and legs. Reflexes are symmetric, no pathologic reflexes present.  Sensory: Intact to light touch Gait: Normal.   Labs: I have reviewed the data as listed    Component Value Date/Time   NA 141 10/11/2023 1027   NA 138 03/15/2023 0000   K 4.7 10/11/2023 1027   CL 102 10/11/2023 1027   CO2 29 10/11/2023 1027   GLUCOSE 253 (H) 10/11/2023 1027   BUN 17 10/11/2023 1027   BUN 16 03/15/2023 0000   CREATININE 1.25 (H) 10/11/2023 1027   CALCIUM  9.8 10/11/2023 1027   PROT 5.9 (L) 10/11/2023 1027   ALBUMIN 3.4 (L) 10/11/2023 1027   AST 26 10/11/2023 1027   ALT 16 10/11/2023 1027   ALKPHOS 188 (H) 10/11/2023 1027   BILITOT 0.3 10/11/2023 1027   GFRNONAA 51 (L) 10/11/2023 1027   GFRNONAA 52 10/23/2022 0000   Lab Results  Component Value Date   WBC 3.2 (L) 10/11/2023   NEUTROABS 2.0 10/11/2023   HGB 11.9 (L) 10/11/2023   HCT 37.5 10/11/2023   MCV 102.2 (H) 10/11/2023   PLT 41 (L) 10/11/2023   Imaging:  [facility] Clinician Interpretation: I have personally reviewed the CNS images as listed.  My interpretation, in the context of the patient's clinical presentation, is stable disease   MR BRAIN W WO CONTRAST Result Date:  10/19/2023 CLINICAL DATA:  Colon cancer with brain metastases.  Restaging EXAM: MRI HEAD WITHOUT AND WITH CONTRAST TECHNIQUE: Multiplanar, multiecho pulse sequences  of the brain and surrounding structures were obtained without and with intravenous contrast. CONTRAST:  7 mL Vueway  COMPARISON:  MR head without and with contrast 04/08/2023 and 03/06/2022 FINDINGS: Brain: Postoperative changes in the left cerebellum are stable. Minimal enhancement along the inferior aspect of the resection cavity is stable. A faint focus of enhancement is again noted in the right cerebellar hemisphere measuring only 3 mm on image 32 of series 13, decreased in size from the prior studies. No new foci of enhancement or significant change in T2 signal changes surrounding the resection cavity are present. Periventricular and subcortical T2 hyperintensities bilaterally are mildly advanced for age, stable. No acute infarct or hemorrhage is present. Deep brain nuclei are within normal limits. The ventricles are of normal size. No significant extraaxial fluid collection is present. Brainstem is within normal limits. The internal auditory canals are within normal limits. Midline structures are within normal limits. Vascular: Flow is present in the major intracranial arteries. Skull and upper cervical spine: The craniocervical junction is normal. Upper cervical spine is within normal limits. Marrow signal is unremarkable. Sinuses/Orbits: Minimal mucosal thickening is present in the inferior maxillary sinuses and bilateral ethmoid air cells. No fluid levels are present. The paranasal sinuses and mastoid air cells are otherwise clear. The globes and orbits are within normal limits. IMPRESSION: 1. Stable postoperative changes in the left cerebellum. 2. Faint focus of enhancement in the right cerebellar hemisphere measuring only 3 mm, decreased in size from the prior studies. This is favored to represent posttreatment changes. 3. No new foci of  enhancement or significant change in T2 signal changes surrounding the resection cavity are present to suggest active or progressive disease. 4. Periventricular and subcortical T2 hyperintensities bilaterally are mildly advanced for age, stable. This likely reflects the sequela of chronic microvascular ischemia. 5. Minimal mucosal thickening in the inferior maxillary sinuses and bilateral ethmoid air cells. No fluid levels are present. Electronically Signed   By: Audree Leas M.D.   On: 10/19/2023 15:31     Assessment/Plan Malignant neoplasm metastatic to brain [facility]) [C79.31]   Isabeau Mccalla is clinically stable today.  MRI brain demonstrates stable findings.  We appreciate the opportunity to participate in the care of Mariaguadalupe Fialkowski.  She will continue to follow with Dr. Harles Lied for systemic therapy.  We ask that Crysta Gulick return to clinic in 9 months with brain MRI, or sooner as needed.  All questions were answered. The patient knows to call the clinic with any problems, questions or concerns. No barriers to learning were detected.  The total time spent in the encounter was 30 minutes and more than 50% was on counseling and review of test results   Mamie Searles, MD Medical Director of Neuro-Oncology [facility] 10/26/23 12:18 PM

## 2023-10-27 ENCOUNTER — Encounter: Payer: Self-pay | Admitting: Oncology

## 2023-10-27 ENCOUNTER — Other Ambulatory Visit: Payer: Self-pay

## 2023-10-28 ENCOUNTER — Other Ambulatory Visit: Payer: Self-pay

## 2023-10-29 ENCOUNTER — Ambulatory Visit (HOSPITAL_BASED_OUTPATIENT_CLINIC_OR_DEPARTMENT_OTHER)
Admission: EM | Admit: 2023-10-29 | Discharge: 2023-10-29 | Disposition: A | Discharge status: Home / Self Care | Attending: Family Medicine | Admitting: Family Medicine

## 2023-10-29 ENCOUNTER — Encounter (HOSPITAL_BASED_OUTPATIENT_CLINIC_OR_DEPARTMENT_OTHER): Payer: Self-pay | Admitting: Emergency Medicine

## 2023-10-29 DIAGNOSIS — J0140 Acute pansinusitis, unspecified: Secondary | ICD-10-CM

## 2023-10-29 DIAGNOSIS — J069 Acute upper respiratory infection, unspecified: Secondary | ICD-10-CM

## 2023-10-29 MED ORDER — FLUTICASONE PROPIONATE 50 MCG/ACT NA SUSP: 1.0000 | Freq: Every day | NASAL | 0 refills | Status: DC | PRN

## 2023-10-29 MED ORDER — DOXYCYCLINE HYCLATE 100 MG PO CAPS: 100.0000 mg | ORAL_CAPSULE | Freq: Two times a day (BID) | ORAL | 0 refills | Status: AC

## 2023-10-29 MED ORDER — COMPACT SPACE CHAMBER DEVI: 0 refills | Status: DC

## 2023-10-29 MED ORDER — ALBUTEROL SULFATE HFA 108 (90 BASE) MCG/ACT IN AERS: 2.0000 | INHALATION_SPRAY | RESPIRATORY_TRACT | 0 refills | Status: DC | PRN

## 2023-10-29 MED ORDER — FLUCONAZOLE 150 MG PO TABS: ORAL_TABLET | ORAL | 0 refills | Status: DC

## 2023-10-29 NOTE — ED Provider Notes (Signed)
 Shannon [name] CARE    CSN: [id number] Arrival date & time: 10/29/23  1531      History   Chief Complaint Chief Complaint  Patient presents with   Cough   Nasal Congestion    HPI Shannon [name] is a 58 y.o. female.   Patient reports respiratory symptoms and congestion since 10/24/2025.  She has taken over-the-counter Zicam and acetaminophen  and Mucinex with poor relief.  She has had cough, congestion, sore throat, nasal congestion, facial pain and a rare wheeze.  She is had some chest tightness if she coughs a lot.  She is worried about sinus infection versus pneumonia.   Cough Associated symptoms: rhinorrhea and wheezing   Associated symptoms: no chest pain, no chills, no ear pain, no fever, no rash, no shortness of breath and no sore throat     Past Medical History:  Diagnosis Date   Anemia    Iron De   Anxiety    Blood clot in vein    Bowel obstruction (HCC)    Colon cancer (HCC) 2018   treated with surgery and chemotherapy   Depression    Diabetes mellitus without complication (HCC)    Type II   DVT (deep venous thrombosis) (HCC) 2019   behind right knee- while she wasa on chemo   GERD (gastroesophageal reflux disease)    History of blood transfusion    History of chemotherapy    History of kidney stones 2012   passed   Hypothyroidism    Obesity     Patient Active Problem List   Diagnosis Date Noted   Nausea without vomiting 06/28/2023   Neutropenia, drug-induced (HCC) 04/28/2021   Metastatic colon cancer to liver (HCC) 01/24/2021   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) 01/24/2021   Diarrhea 10/31/2020   Dehydration 10/31/2020   Cerebellar mass 10/24/2020   Major depressive disorder, recurrent episode, moderate (HCC) 10/24/2020   Orthostatic hypotension 08/26/2020   Vascular headache 08/26/2020   Colon cancer (HCC) 07/23/2020   Body mass index (BMI) 31.0-31.9, adult 07/18/2020   Insomnia 07/05/2020   Vertigo, central 07/02/2020    Malignant neoplasm metastatic to brain (HCC) 05/29/2020   Iron deficiency anemia, unspecified 05/13/2020   OCD (obsessive compulsive disorder) 03/30/2018   Malignant neoplasm of hepatic flexure of colon (HCC) 03/17/2017    Past Surgical History:  Procedure Laterality Date   ABDOMINAL HYSTERECTOMY  05/2019   total laparoscopin with tubes and ovariers removed also.   APPENDECTOMY  05/2019   BREAST BIOPSY Right 2020   COLON SURGERY  03/2017   Colon resection   COLONOSCOPY  2019   SUBOCCIPITAL CRANIECTOMY CERVICAL LAMINECTOMY Left 05/29/2020   Procedure: Suboccipital Craniectomy for Resection of Left Cerebellar Mass;  Surgeon: Gearl Keens, MD;  Location: [facility] OR;  Service: Neurosurgery;  Laterality: Left;    OB History   No obstetric history on file.      Home Medications    Prior to Admission medications   Medication Sig Start Date End Date Taking? Authorizing Provider  albuterol  (VENTOLIN  HFA) 108 (90 Base) MCG/ACT inhaler Inhale 2 puffs into the lungs every 4 (four) hours as needed for wheezing or shortness of breath. 10/29/23  Yes Guss Legacy, FNP  doxycycline  (VIBRAMYCIN ) 100 MG capsule Take 1 capsule (100 mg total) by mouth 2 (two) times daily for 10 days. 10/29/23 11/08/23 Yes Guss Legacy, FNP  fluconazole  (DIFLUCAN ) 150 MG tablet By mouth today and repeat in 5-7 days for vaginal yeast infection. 10/29/23  Yes Guss Legacy, FNP  fluticasone  (FLONASE ) 50 MCG/ACT nasal spray Place 1 spray into both nostrils daily as needed for rhinitis. 10/29/23 11/28/23 Yes Guss Legacy, FNP  Spacer/Aero-Holding Chambers (COMPACT SPACE CHAMBER) DEVI Use with the albuterol  inhaler 10/29/23  Yes Guss Legacy, FNP  acetaminophen  (TYLENOL ) 325 MG tablet Take 2 tablets (650 mg total) by mouth every 6 (six) hours as needed for mild pain. 06/13/20   Angiulli, Everlyn Hockey, PA-C  atorvastatin  (LIPITOR) 10 MG tablet Take by mouth. 02/08/17   [provider]  cariprazine  (VRAYLAR ) 3 MG capsule Take 1  capsule (3 mg total) by mouth daily. Patient taking differently: Take 3 mg by mouth daily. 1 every other day 05/20/23   Cottle, Kennedy Peabody., MD  CONTOUR NEXT TEST test strip 3 (three) times daily. 08/02/20   [provider]  Dextromethorphan-buPROPion  ER (AUVELITY ) 45-105 MG TBCR Take 1 tablet twice daily 09/14/23   Cottle, Kennedy Peabody., MD  Dulaglutide  (TRULICITY ) 3 MG/0.5ML SOAJ Inject 3 mg into the skin once a week. 12/14/22     fluvoxaMINE  (LUVOX ) 100 MG tablet Take 3 tablets (300 mg total) by mouth at bedtime. 09/14/23   Cottle, Kennedy Peabody., MD  hyoscyamine  (LEVSIN SL) 0.125 MG SL tablet Place 1 tablet (0.125 mg total) under the tongue every 6 (six) hours as needed. 06/13/20   Angiulli, Everlyn Hockey, PA-C  insulin  aspart (NOVOLOG  FLEXPEN) 100 UNIT/ML FlexPen Inject 8 Units into the skin 3 (three) times daily with meals. 06/14/20   Angiulli, Everlyn Hockey, PA-C  insulin  glargine (LANTUS ) 100 UNIT/ML Solostar Pen Inject 26 Units into the skin daily. 06/14/20   Angiulli, Daniel J, PA-C  levothyroxine  (SYNTHROID ) 75 MCG tablet Take 1 tablet (75 mcg total) by mouth daily. 06/13/20   Angiulli, Everlyn Hockey, PA-C  loratadine (CLARITIN) 10 MG tablet Take 10 mg by mouth daily.    [provider]  LORazepam  (ATIVAN ) 0.5 MG tablet Take 1 tablet (0.5 mg total) by mouth every 8 (eight) hours as needed for anxiety. 03/31/22   Cottle, Kennedy Peabody., MD  magic mouthwash SOLN Take 10 mLs by mouth 3 (three) times daily as needed for mouth pain. 11/17/22   [provider]  modafinil  (PROVIGIL ) 200 MG tablet TAKE 1 & 1/2 (ONE & ONE-HALF) TABLETS BY MOUTH ONCE DAILY 10/23/23   Cottle, Kennedy Peabody., MD  Multiple Vitamin (MULTI-VITAMIN) tablet Take 1 tablet by mouth daily.    [provider]  NOVOFINE PEN NEEDLE 32G X 6 MM MISC SMARTSIG:Syringe(s) SUB-Q 06/30/20   [provider]  omeprazole (PRILOSEC) 40 MG capsule Take 40 mg by mouth daily. 06/09/23   [provider]  ondansetron  (ZOFRAN ) 4  MG tablet Take 1 tablet (4 mg total) by mouth every 4 (four) hours as needed for nausea. 07/29/20   Parsons, Melissa A, NP  ondansetron  (ZOFRAN -ODT) 4 MG disintegrating tablet Take 1 tablet (4 mg total) by mouth every 4 (four) hours as needed for nausea or vomiting. Dissolve on tongue 09/02/22   Lewis, Dequincy A, MD  promethazine  (PHENERGAN ) 25 MG tablet Take 1 tablet (25 mg total) by mouth every 6 (six) hours as needed for nausea. 09/28/22   Lewis, Dequincy A, MD  senna-docusate (SENOKOT-S) 8.6-50 MG tablet Take 2 tablets by mouth at bedtime. 06/13/20   Angiulli, Daniel J, PA-C  sucralfate (CARAFATE) 1 g tablet Take 1 g by mouth 4 (four) times daily. 03/16/23   [provider]  traZODone  (DESYREL ) 50 MG tablet 1-2  tablets at night as needed for sleep 09/14/23   Cottle, Kennedy Peabody., MD    Family History Family History  Problem Relation Age of Onset   Irritable bowel syndrome Mother    Colon polyps Father    Breast cancer Maternal Grandmother    Diabetes Maternal Grandmother    Diabetes Maternal Grandfather    Breast cancer Paternal Grandmother    Colon polyps Maternal Uncle    Stomach cancer Neg Hx    Pancreatic cancer Neg Hx    Esophageal cancer Neg Hx    Colon cancer Neg Hx     Social History Social History   Tobacco Use   Smoking status: Never   Smokeless tobacco: Never  Vaping Use   Vaping status: Never Used  Substance Use Topics   Alcohol use: Yes    Comment: rare   Drug use: Never     Allergies   Leucovorin , Clindamycin/lincomycin, Irinotecan , and Penicillins   Review of Systems Review of Systems  Constitutional:  Negative for chills and fever.  HENT:  Positive for congestion, postnasal drip, rhinorrhea and sinus pressure. Negative for ear pain and sore throat.   Eyes:  Negative for pain and visual disturbance.  Respiratory:  Positive for cough, chest tightness and wheezing. Negative for shortness of breath.   Cardiovascular:  Negative for chest pain and  palpitations.  Gastrointestinal:  Negative for abdominal pain, constipation, diarrhea, nausea and vomiting.  Genitourinary:  Negative for dysuria and hematuria.  Musculoskeletal:  Negative for arthralgias and back pain.  Skin:  Negative for color change and rash.  Neurological:  Negative for seizures and syncope.  All other systems reviewed and are negative.    Physical Exam Triage Vital Signs ED Triage Vitals  Encounter Vitals Group     BP 10/29/23 1706 111/75     Systolic BP Percentile --      Diastolic BP Percentile --      Pulse Rate 10/29/23 1706 95     Resp 10/29/23 1706 18     Temp 10/29/23 1706 98.2 F (36.8 C)     Temp Source 10/29/23 1706 Oral     SpO2 10/29/23 1706 95 %     Weight --      Height --      Head Circumference --      Peak Flow --      Pain Score 10/29/23 1704 2     Pain Loc --      Pain Education --      Exclude from Growth Chart --    No data found.  Updated Vital Signs BP 111/75 (BP Location: Right Arm)   Pulse 95   Temp 98.2 F (36.8 C) (Oral)   Resp 18   LMP 02/22/2019 (Approximate)   SpO2 95%   Visual Acuity Right Eye Distance:   Left Eye Distance:   Bilateral Distance:    Right Eye Near:   Left Eye Near:    Bilateral Near:     Physical Exam Vitals and nursing note reviewed.  Constitutional:      General: She is not in acute distress.    Appearance: She is well-developed. She is ill-appearing. She is not toxic-appearing.  HENT:     Head: Normocephalic and atraumatic.     Right Ear: Hearing, tympanic membrane, ear canal and external ear normal.     Left Ear: Hearing, tympanic membrane, ear canal and external ear normal.     Nose: Mucosal edema, congestion and  rhinorrhea present. Rhinorrhea is clear.     Right Sinus: Maxillary sinus tenderness and frontal sinus tenderness present.     Left Sinus: Maxillary sinus tenderness and frontal sinus tenderness present.     Mouth/Throat:     Lips: Pink.     Mouth: Mucous membranes are  moist.     Pharynx: Uvula midline. No oropharyngeal exudate or posterior oropharyngeal erythema.     Tonsils: No tonsillar exudate.  Eyes:     Conjunctiva/sclera: Conjunctivae normal.     Pupils: Pupils are equal, round, and reactive to light.  Cardiovascular:     Rate and Rhythm: Normal rate and regular rhythm.     Heart sounds: S1 normal and S2 normal. No murmur heard. Pulmonary:     Effort: Pulmonary effort is normal. No respiratory distress.     Breath sounds: Normal breath sounds. No decreased breath sounds, wheezing, rhonchi or rales.     Comments: Lungs are clear at this time but patient has reported some intermittent wheezing. Abdominal:     General: Bowel sounds are normal.     Palpations: Abdomen is soft.     Tenderness: There is no abdominal tenderness.  Musculoskeletal:        General: No swelling.     Cervical back: Neck supple.  Lymphadenopathy:     Head:     Right side of head: No submental, submandibular, tonsillar, preauricular or posterior auricular adenopathy.     Left side of head: No submental, submandibular, tonsillar, preauricular or posterior auricular adenopathy.     Cervical: Cervical adenopathy present.     Right cervical: Superficial cervical adenopathy present.     Left cervical: Superficial cervical adenopathy present.  Skin:    General: Skin is warm and dry.     Capillary Refill: Capillary refill takes less than 2 seconds.     Findings: No rash.  Neurological:     Mental Status: She is alert and oriented to person, place, and time.  Psychiatric:        Mood and Affect: Mood normal.      UC Treatments / Results  Labs (all labs ordered are listed, but only abnormal results are displayed) Labs Reviewed - No data to display  EKG   Radiology No results found.  Procedures Procedures (including critical care time)  Medications Ordered in UC Medications - No data to display  Initial Impression / Assessment and Plan / UC Course  I have  reviewed the triage vital signs and the nursing notes.  Pertinent labs & imaging results that were available during my care of the patient were reviewed by me and considered in my medical decision making (see chart for details).  Plan of Care: Pansinusitis: Doxycycline  100 mg twice daily for 10 days.  Fluticasone  nasal spray 1 spray into each nostril at least once daily and could use twice daily if needed.  Strongly encouraged to use sinus rinses.  Viral URI with cough: Albuterol  inhaler, 2 puffs every 4 hours as needed for wheezing.  Encouraged to use with a spacer.  The patient is not interested in doing this but she had printed prescriptions to use if needed.  Fluconazole , 150 mg now and repeat in a week.  Printed the prescription for the patient.  She reports that sometimes she gets vaginal yeast with antibiotic use but she did not have an issue right now.  Follow-up if symptoms do not improve, worsen or new symptoms occur.  I reviewed the plan of care  with the patient and/or the patient's guardian.  The patient and/or guardian had time to ask questions and acknowledged that the questions were answered.  I provided instruction on symptoms or reasons to return here or to go to an ER, if symptoms/condition did not improve, worsened or if new symptoms occurred.  Final Clinical Impressions(s) / UC Diagnoses   Final diagnoses:  Acute non-recurrent pansinusitis  Viral URI with cough     Discharge Instructions      Will treat for sinusitis with doxycycline , 100 mg twice daily for 10 days.  Also fluticasone  1 spray into each nostril once daily for nasal congestion.  Encouraged to perform sinus rinses twice daily.  Viral upper respiratory infection: Lungs are clear right now.  She reports she has had a few times and she was wheezing.  Provided albuterol  inhaler with spacer prescription, 2 puffs, every 4 hours as needed for wheezing.  These were printed as she is not sure she wants to pick them  up right now.  Also provided fluconazole , 150 mg 1 now and repeat in a week if needed for yeast infection.  This prescription was also printed.  Get plenty of fluids and rest.  Recheck if symptoms do not improve, worsen or new symptoms occur.   ED Prescriptions     Medication Sig Dispense Auth. Provider   albuterol  (VENTOLIN  HFA) 108 (90 Base) MCG/ACT inhaler Inhale 2 puffs into the lungs every 4 (four) hours as needed for wheezing or shortness of breath. 1 each Guss Legacy, FNP   Spacer/Aero-Holding Chambers (COMPACT SPACE CHAMBER) DEVI Use with the albuterol  inhaler 1 each Guss Legacy, FNP   fluconazole  (DIFLUCAN ) 150 MG tablet By mouth today and repeat in 5-7 days for vaginal yeast infection. 2 tablet Anayansi Rundquist, FNP   fluticasone  (FLONASE ) 50 MCG/ACT nasal spray Place 1 spray into both nostrils daily as needed for rhinitis. 17 mL Guss Legacy, FNP   doxycycline  (VIBRAMYCIN ) 100 MG capsule Take 1 capsule (100 mg total) by mouth 2 (two) times daily for 10 days. 20 capsule Guss Legacy, FNP      PDMP not reviewed this encounter.   Guss Legacy, FNP 10/31/23 [phone]

## 2023-10-29 NOTE — ED Triage Notes (Signed)
 Pt reports having chest tightness, sore throat, cough that is not productive, congestion that started on Monday. Took Zicam, acetaminophen  and Mucinex.

## 2023-10-29 NOTE — Discharge Instructions (Addendum)
 Will treat for sinusitis with doxycycline , 100 mg twice daily for 10 days.  Also fluticasone  1 spray into each nostril once daily for nasal congestion.  Encouraged to perform sinus rinses twice daily.  Viral upper respiratory infection: Lungs are clear right now.  She reports she has had a few times and she was wheezing.  Provided albuterol  inhaler with spacer prescription, 2 puffs, every 4 hours as needed for wheezing.  These were printed as she is not sure she wants to pick them up right now.  Also provided fluconazole , 150 mg 1 now and repeat in a week if needed for yeast infection.  This prescription was also printed.  Get plenty of fluids and rest.  Recheck if symptoms do not improve, worsen or new symptoms occur.

## 2023-10-31 NOTE — Progress Notes (Unsigned)
 [facility]  [address] [place],  [place]  [address] [phone]  Clinic Day:  11/03/2023  Referring physician: Deloria Fetch, MD  HISTORY OF PRESENT ILLNESS:  The patient is a 58 y.o. female with metastatic colon cancer, which includes spread of disease to her abdominal cavity, lungs, brain and right adrenal gland.  She comes in today to be evaluated before heading into her 25th cycle of palliative chemotherapy, which consists of FOLFOX/Avastin .  Since her last visit, the patient has been doing fairly well.  She does complain of steady fatigue, likely related to her numerous years of chemotherapy.  Although she still has diarrhea, it has not been prominent over the past few weeks.  She also has intermittent nausea.  However, she denies having any abdominal pain at this time or other new GI symptoms/findings which concern her for overt signs of disease progression.    With respect to her colon cancer history, she is status post a right hemicolectomy in early October 2018, followed by 12 cycles of adjuvant FOLFOX chemotherapy, which were completed in April 2019.  In December 2021, she underwent a left cerebellar metastasectomy, whose pathology was consistent with metastatic colon cancer.  Tumor testing did come back MMR normal. CT scans revealed evidence of her cancer being in multiple locations, for which she took 40 cycles of FOLFIRI/Avastin .  As follow-up scans revealed liver metastasis, she was placed back on FOLFOX/Avastin , for which she continues to take.  PHYSICAL EXAM:  Blood pressure 115/82, pulse 94, temperature 98 F (36.7 C), temperature source Oral, resp. rate 16, height 5\' 8"  (1.727 m), weight 144 lb (65.3 kg), last menstrual period 02/22/2019, SpO2 99%. Body mass index is 21.9 kg/m.  Performance status (ECOG): 1 - Symptomatic but completely ambulatory  Physical Exam Vitals and nursing note reviewed.  Constitutional:      General: She is  not in acute distress.    Appearance: Normal appearance.  HENT:     Head: Normocephalic and atraumatic.     Mouth/Throat:     Mouth: Mucous membranes are moist.     Pharynx: Oropharynx is clear. No oropharyngeal exudate or posterior oropharyngeal erythema.  Eyes:     General: No scleral icterus.    Extraocular Movements: Extraocular movements intact.     Conjunctiva/sclera: Conjunctivae normal.     Pupils: Pupils are equal, round, and reactive to light.  Cardiovascular:     Rate and Rhythm: Normal rate and regular rhythm.     Heart sounds: Normal heart sounds. No murmur heard.    No friction rub. No gallop.  Pulmonary:     Effort: Pulmonary effort is normal.     Breath sounds: Normal breath sounds. No wheezing, rhonchi or rales.  Abdominal:     General: There is no distension.     Palpations: Abdomen is soft. There is no hepatomegaly, splenomegaly or mass.     Tenderness: There is no abdominal tenderness.  Musculoskeletal:        General: Normal range of motion.     Cervical back: Normal range of motion and neck supple. No tenderness.     Right lower leg: No edema.     Left lower leg: No edema.  Lymphadenopathy:     Cervical: No cervical adenopathy.     Upper Body:     Right upper body: No supraclavicular or axillary adenopathy.     Left upper body: No supraclavicular or axillary adenopathy.     Lower  Body: No right inguinal adenopathy. No left inguinal adenopathy.  Skin:    General: Skin is warm and dry.     Coloration: Skin is not jaundiced.     Findings: No rash.  Neurological:     Mental Status: She is alert and oriented to person, place, and time.     Cranial Nerves: No cranial nerve deficit.  Psychiatric:        Mood and Affect: Mood normal.        Behavior: Behavior normal.        Thought Content: Thought content normal.    LABS:      Latest Ref Rng & Units 11/03/2023   10:55 AM 10/11/2023   10:27 AM 09/20/2023    3:35 PM  CBC  WBC 4.0 - 10.5 K/uL 3.7  3.2   2.0   Hemoglobin 12.0 - 15.0 g/dL 16.1  09.6  04.5   Hematocrit 36.0 - 46.0 % 40.1  37.5  33.3   Platelets 150 - 400 K/uL 46  41  32       Latest Ref Rng & Units 11/03/2023   10:55 AM 10/11/2023   10:27 AM 08/30/2023    9:23 AM  CMP  Glucose 70 - 99 mg/dL 409  811  914   BUN 6 - 20 mg/dL 17  17  12    Creatinine 0.44 - 1.00 mg/dL 7.82  9.56  2.13   Sodium 135 - 145 mmol/L 141  141  141   Potassium 3.5 - 5.1 mmol/L 4.2  4.7  4.4   Chloride 98 - 111 mmol/L 103  102  102   CO2 22 - 32 mmol/L 26  29  29    Calcium  8.9 - 10.3 mg/dL 9.8  9.8  9.4   Total Protein 6.5 - 8.1 g/dL 6.5  5.9  5.7   Total Bilirubin 0.0 - 1.2 mg/dL 0.5  0.3  0.4   Alkaline Phos 38 - 126 U/L 227  188  198   AST 15 - 41 U/L 31  26  37   ALT 0 - 44 U/L 18  16  42    ASSESSMENT & PLAN:  Assessment/Plan:  A 58 y.o. female with metastatic colon cancer. She will proceed with her 25th cycle of FOLFOX/Avastin  today, which she now receives every 3 weeks.  With respect to her thrombocytopenia, as her platelets are above 40 she does not have any bleeding issues, a platelet pheresis will not be done this week.  As she believes her nausea is slightly more pronounced, I will prescribe her olanzapine 5 mg to see if this will help over time.  Otherwise, she clinically appears to be doing well.  I will see her back in 3 weeks before she heads into her 26th cycle of FOLFOX/Avastin .  Repeat CT scans will be done a day before her next visit to ascertain her new disease baseline after 25 cycles of FOLFOX/Avastin .  The patient understands all the plans discussed today and is in agreement with them.  Rolonda Pontarelli Felicia Horde, MD

## 2023-11-01 ENCOUNTER — Telehealth: Payer: Self-pay | Admitting: Psychiatry

## 2023-11-01 ENCOUNTER — Encounter

## 2023-11-01 ENCOUNTER — Other Ambulatory Visit (HOSPITAL_COMMUNITY): Payer: Self-pay

## 2023-11-01 DIAGNOSIS — F333 Major depressive disorder, recurrent, severe with psychotic symptoms: Secondary | ICD-10-CM

## 2023-11-01 DIAGNOSIS — F422 Mixed obsessional thoughts and acts: Secondary | ICD-10-CM

## 2023-11-01 MED ORDER — AUVELITY 45-105 MG PO TBCR
EXTENDED_RELEASE_TABLET | ORAL | 4 refills | Status: DC
Start: 2023-11-01 — End: 2024-04-11

## 2023-11-01 MED ORDER — CARIPRAZINE HCL 3 MG PO CAPS: ORAL_CAPSULE | ORAL | 0 refills | Status: DC

## 2023-11-01 NOTE — Telephone Encounter (Signed)
 Sent Vraylar  RF.  I show she has RF for Auvelity  but pharmacy said they were deleted. Resent the Rx for Auvelity .

## 2023-11-01 NOTE — Telephone Encounter (Signed)
 Next visit is 12/15/23. Shannon [name] is requesting Vraylar  and Auvility called to:  [id number] - [place], [place] - [address]   Phone: [phone]  Fax: [phone]

## 2023-11-01 NOTE — Addendum Note (Signed)
 Addended by: Geno Kida T on: 11/01/2023 04:46 PM   Modules accepted: Orders

## 2023-11-02 ENCOUNTER — Other Ambulatory Visit: Payer: Self-pay

## 2023-11-03 ENCOUNTER — Inpatient Hospital Stay: Attending: Oncology | Admitting: Oncology

## 2023-11-03 ENCOUNTER — Other Ambulatory Visit: Payer: Self-pay | Admitting: Oncology

## 2023-11-03 ENCOUNTER — Inpatient Hospital Stay

## 2023-11-03 ENCOUNTER — Encounter: Payer: Self-pay | Admitting: Oncology

## 2023-11-03 VITALS — BP 115/82 | HR 94 | Temp 98.0°F | Resp 16 | Ht 68.0 in | Wt 144.0 lb

## 2023-11-03 DIAGNOSIS — C189 Malignant neoplasm of colon, unspecified: Secondary | ICD-10-CM | POA: Insufficient documentation

## 2023-11-03 DIAGNOSIS — C183 Malignant neoplasm of hepatic flexure: Secondary | ICD-10-CM

## 2023-11-03 DIAGNOSIS — C7971 Secondary malignant neoplasm of right adrenal gland: Secondary | ICD-10-CM | POA: Insufficient documentation

## 2023-11-03 DIAGNOSIS — C7801 Secondary malignant neoplasm of right lung: Secondary | ICD-10-CM | POA: Diagnosis not present

## 2023-11-03 DIAGNOSIS — C182 Malignant neoplasm of ascending colon: Secondary | ICD-10-CM

## 2023-11-03 DIAGNOSIS — Z5189 Encounter for other specified aftercare: Secondary | ICD-10-CM | POA: Diagnosis not present

## 2023-11-03 DIAGNOSIS — C787 Secondary malignant neoplasm of liver and intrahepatic bile duct: Secondary | ICD-10-CM

## 2023-11-03 DIAGNOSIS — C7931 Secondary malignant neoplasm of brain: Secondary | ICD-10-CM | POA: Diagnosis not present

## 2023-11-03 DIAGNOSIS — C7802 Secondary malignant neoplasm of left lung: Secondary | ICD-10-CM | POA: Insufficient documentation

## 2023-11-03 DIAGNOSIS — R110 Nausea: Secondary | ICD-10-CM | POA: Insufficient documentation

## 2023-11-03 DIAGNOSIS — C786 Secondary malignant neoplasm of retroperitoneum and peritoneum: Secondary | ICD-10-CM | POA: Diagnosis not present

## 2023-11-03 DIAGNOSIS — D696 Thrombocytopenia, unspecified: Secondary | ICD-10-CM | POA: Insufficient documentation

## 2023-11-03 DIAGNOSIS — Z5111 Encounter for antineoplastic chemotherapy: Secondary | ICD-10-CM | POA: Insufficient documentation

## 2023-11-03 LAB — CBC WITH DIFFERENTIAL (CANCER CENTER ONLY)
Abs Immature Granulocytes: 0 10*3/uL (ref 0.00–0.07)
Basophils Absolute: 0 10*3/uL (ref 0.0–0.1)
Basophils Relative: 1 %
Eosinophils Absolute: 0.1 10*3/uL (ref 0.0–0.5)
Eosinophils Relative: 2 %
HCT: 40.1 % (ref 36.0–46.0)
Hemoglobin: 12.6 g/dL (ref 12.0–15.0)
Immature Granulocytes: 0 %
Immature Platelet Fraction: 1.4 % (ref 1.2–8.6)
Lymphocytes Relative: 28 %
Lymphs Abs: 1 10*3/uL (ref 0.7–4.0)
MCH: 31.9 pg (ref 26.0–34.0)
MCHC: 31.4 g/dL (ref 30.0–36.0)
MCV: 101.5 fL — ABNORMAL HIGH (ref 80.0–100.0)
Monocytes Absolute: 0.5 10*3/uL (ref 0.1–1.0)
Monocytes Relative: 14 %
Neutro Abs: 2.1 10*3/uL (ref 1.7–7.7)
Neutrophils Relative %: 55 %
Platelet Count: 46 10*3/uL — ABNORMAL LOW (ref 150–400)
RBC: 3.95 MIL/uL (ref 3.87–5.11)
RDW: 15.9 % — ABNORMAL HIGH (ref 11.5–15.5)
WBC Count: 3.7 10*3/uL — ABNORMAL LOW (ref 4.0–10.5)
nRBC: 0 % (ref 0.0–0.2)

## 2023-11-03 LAB — CMP (CANCER CENTER ONLY)
ALT: 18 U/L (ref 0–44)
AST: 31 U/L (ref 15–41)
Albumin: 3.6 g/dL (ref 3.5–5.0)
Alkaline Phosphatase: 227 U/L — ABNORMAL HIGH (ref 38–126)
Anion gap: 12 (ref 5–15)
BUN: 17 mg/dL (ref 6–20)
CO2: 26 mmol/L (ref 22–32)
Calcium: 9.8 mg/dL (ref 8.9–10.3)
Chloride: 103 mmol/L (ref 98–111)
Creatinine: 1.46 mg/dL — ABNORMAL HIGH (ref 0.44–1.00)
GFR, Estimated: 42 mL/min — ABNORMAL LOW (ref >60)
Glucose, Bld: 126 mg/dL — ABNORMAL HIGH (ref 70–99)
Potassium: 4.2 mmol/L (ref 3.5–5.1)
Sodium: 141 mmol/L (ref 135–145)
Total Bilirubin: 0.5 mg/dL (ref 0.0–1.2)
Total Protein: 6.5 g/dL (ref 6.5–8.1)

## 2023-11-03 MED ORDER — OXALIPLATIN CHEMO INJECTION 50 MG/10ML
63.7500 mg/m2 | Freq: Once | INTRAVENOUS | Status: AC
  Administered 2023-11-03: 115 mg via INTRAVENOUS
  Filled 2023-11-03: qty 20

## 2023-11-03 MED ORDER — HEPARIN SOD (PORK) LOCK FLUSH 100 UNIT/ML IV SOLN
500.0000 [IU] | Freq: Once | INTRAVENOUS | Status: DC | PRN
Start: 2023-11-03 — End: 2023-11-03

## 2023-11-03 MED ORDER — SODIUM CHLORIDE 0.9% FLUSH: 10.0000 mL | INTRAVENOUS | Status: DC | PRN

## 2023-11-03 MED ORDER — PALONOSETRON HCL INJECTION 0.25 MG/5ML
0.2500 mg | Freq: Once | INTRAVENOUS | Status: AC
  Administered 2023-11-03: 0.25 mg via INTRAVENOUS
  Filled 2023-11-03: qty 5

## 2023-11-03 MED ORDER — SODIUM CHLORIDE 0.9 % IV SOLN
2400.0000 mg/m2 | INTRAVENOUS | Status: DC
  Administered 2023-11-03: 4350 mg via INTRAVENOUS
  Filled 2023-11-03: qty 87

## 2023-11-03 MED ORDER — DEXAMETHASONE SODIUM PHOSPHATE 10 MG/ML IJ SOLN
10.0000 mg | Freq: Once | INTRAMUSCULAR | Status: AC
  Administered 2023-11-03: 10 mg via INTRAVENOUS
  Filled 2023-11-03: qty 1

## 2023-11-03 MED ORDER — SODIUM CHLORIDE 0.9 % IV SOLN: Freq: Once | INTRAVENOUS | Status: AC

## 2023-11-03 MED ORDER — FLUOROURACIL CHEMO INJECTION 2.5 GM/50ML
400.0000 mg/m2 | Freq: Once | INTRAVENOUS | Status: AC
  Administered 2023-11-03: 700 mg via INTRAVENOUS
  Filled 2023-11-03: qty 14

## 2023-11-03 MED ORDER — SODIUM CHLORIDE 0.9 % IV SOLN
150.0000 mg | Freq: Once | INTRAVENOUS | Status: AC
  Administered 2023-11-03: 150 mg via INTRAVENOUS
  Filled 2023-11-03: qty 150

## 2023-11-03 MED ORDER — DEXTROSE 5 % IV SOLN: Freq: Once | INTRAVENOUS | Status: AC

## 2023-11-03 MED ORDER — DIPHENHYDRAMINE HCL 50 MG/ML IJ SOLN
25.0000 mg | Freq: Once | INTRAMUSCULAR | Status: AC
  Administered 2023-11-03: 25 mg via INTRAVENOUS
  Filled 2023-11-03: qty 1

## 2023-11-03 MED ORDER — FAMOTIDINE IN NACL 20-0.9 MG/50ML-% IV SOLN
20.0000 mg | Freq: Once | INTRAVENOUS | Status: AC
  Administered 2023-11-03: 20 mg via INTRAVENOUS
  Filled 2023-11-03: qty 50

## 2023-11-03 MED ORDER — SODIUM CHLORIDE 0.9 % IV SOLN
5.0000 mg/kg | Freq: Once | INTRAVENOUS | Status: AC
  Administered 2023-11-03: 350 mg via INTRAVENOUS
  Filled 2023-11-03: qty 14

## 2023-11-03 NOTE — Progress Notes (Signed)
 Ok to treat with platelets of 46, per Dr. Harles Lied

## 2023-11-03 NOTE — Patient Instructions (Signed)
 The chemotherapy medication bag should finish at 46 hours. For example, if your pump is scheduled for 46 hours and it was put on at 4:00 p.m., it should finish at 2:00 p.m. the day it is scheduled to come off regardless of your appointment time.     Estimated time to finish at 2pm.   If the display on your pump reads "Low Volume" and it is beeping, take the batteries out of the pump and come to the cancer center for it to be taken off.   If the pump alarms go off prior to the pump reading "Low Volume" then call [phone] and someone can assist you.  If the plunger comes out and the chemotherapy medication is leaking out, please use your home chemo spill kit to clean up the spill. Do NOT use paper towels or other household products.  If you have problems or questions regarding your pump, please call either [phone] (24 hours a day) or the cancer center Monday-Friday 8:00 a.m.- 4:30 p.m. at the clinic number and we will assist you. If you are unable to get assistance, then go to the nearest Emergency Department and ask the staff to contact the IV team for assistance.

## 2023-11-04 ENCOUNTER — Other Ambulatory Visit: Payer: Self-pay | Admitting: Oncology

## 2023-11-04 DIAGNOSIS — C189 Malignant neoplasm of colon, unspecified: Secondary | ICD-10-CM

## 2023-11-04 DIAGNOSIS — C182 Malignant neoplasm of ascending colon: Secondary | ICD-10-CM

## 2023-11-04 MED ORDER — OLANZAPINE 5 MG PO TABS: 5.0000 mg | ORAL_TABLET | Freq: Every day | ORAL | 1 refills | Status: DC

## 2023-11-05 ENCOUNTER — Encounter: Payer: Self-pay | Admitting: Oncology

## 2023-11-05 ENCOUNTER — Inpatient Hospital Stay

## 2023-11-05 ENCOUNTER — Encounter

## 2023-11-05 VITALS — BP 102/73 | HR 102 | Temp 97.9°F | Resp 18 | Ht 68.0 in

## 2023-11-05 DIAGNOSIS — C7800 Secondary malignant neoplasm of unspecified lung: Secondary | ICD-10-CM

## 2023-11-05 DIAGNOSIS — C787 Secondary malignant neoplasm of liver and intrahepatic bile duct: Secondary | ICD-10-CM

## 2023-11-05 DIAGNOSIS — Z5111 Encounter for antineoplastic chemotherapy: Secondary | ICD-10-CM | POA: Diagnosis not present

## 2023-11-05 DIAGNOSIS — D696 Thrombocytopenia, unspecified: Secondary | ICD-10-CM

## 2023-11-05 DIAGNOSIS — C182 Malignant neoplasm of ascending colon: Secondary | ICD-10-CM

## 2023-11-05 LAB — CBC WITH DIFFERENTIAL (CANCER CENTER ONLY)
Abs Immature Granulocytes: 0.02 10*3/uL (ref 0.00–0.07)
Basophils Absolute: 0 10*3/uL (ref 0.0–0.1)
Basophils Relative: 0 %
Eosinophils Absolute: 0 10*3/uL (ref 0.0–0.5)
Eosinophils Relative: 1 %
HCT: 38.7 % (ref 36.0–46.0)
Hemoglobin: 12.6 g/dL (ref 12.0–15.0)
Immature Granulocytes: 1 %
Immature Platelet Fraction: 2.1 % (ref 1.2–8.6)
Lymphocytes Relative: 22 %
Lymphs Abs: 0.7 10*3/uL (ref 0.7–4.0)
MCH: 32.1 pg (ref 26.0–34.0)
MCHC: 32.6 g/dL (ref 30.0–36.0)
MCV: 98.5 fL (ref 80.0–100.0)
Monocytes Absolute: 0.1 10*3/uL (ref 0.1–1.0)
Monocytes Relative: 4 %
Neutro Abs: 2.3 10*3/uL (ref 1.7–7.7)
Neutrophils Relative %: 72 %
Platelet Count: 32 10*3/uL — ABNORMAL LOW (ref 150–400)
RBC: 3.93 MIL/uL (ref 3.87–5.11)
RDW: 15.7 % — ABNORMAL HIGH (ref 11.5–15.5)
WBC Count: 3.2 10*3/uL — ABNORMAL LOW (ref 4.0–10.5)
nRBC: 0 % (ref 0.0–0.2)

## 2023-11-05 MED ORDER — SODIUM CHLORIDE 0.9% FLUSH
10.0000 mL | INTRAVENOUS | Status: DC | PRN
  Administered 2023-11-05: 10 mL

## 2023-11-05 MED ORDER — SODIUM CHLORIDE 0.9 % IV SOLN: Freq: Once | INTRAVENOUS | Status: AC

## 2023-11-05 MED ORDER — HEPARIN SOD (PORK) LOCK FLUSH 100 UNIT/ML IV SOLN
500.0000 [IU] | Freq: Once | INTRAVENOUS | Status: AC | PRN
  Administered 2023-11-05: 500 [IU]

## 2023-11-05 NOTE — Patient Instructions (Signed)
 Diarrhea, Adult Diarrhea is frequent loose and sometimes watery bowel movements. Diarrhea can make you feel weak and cause you to become dehydrated. Dehydration is a condition in which there is not enough water or other fluids in the body. Dehydration can make you tired and thirsty, cause you to have a dry mouth, and decrease how often you urinate. Diarrhea typically lasts 2-3 days. However, it can last longer if it is a sign of something more serious. It is important to treat your diarrhea as told by your health care provider. Follow these instructions at home: Eating and drinking     Follow these recommendations as told by your health care provider: Take an oral rehydration solution (ORS). This is an over-the-counter medicine that helps return your body to its normal balance of nutrients and water. It is found at pharmacies and retail stores. Drink enough fluid to keep your urine pale yellow. Drink fluids such as water, diluted fruit juice, and low-calorie sports drinks. You can drink milk also, if desired. Sucking on ice chips is another way to get fluids. Avoid drinking fluids that contain a lot of sugar or caffeine, such as soda, energy drinks, and regular sports drinks. Avoid alcohol. Eat bland, easy-to-digest foods in small amounts as you are able. These foods include bananas, applesauce, rice, lean meats, toast, and crackers. Avoid spicy or fatty foods.  Medicines Take over-the-counter and prescription medicines only as told by your health care provider. If you were prescribed antibiotics, take them as told by your health care provider. Do not stop using the antibiotic even if you start to feel better. General instructions  Wash your hands often using soap and water for at least 20 seconds. If soap and water are not available, use hand sanitizer. Others in the household should wash their hands as well. Hands should be washed: After using the toilet or changing a diaper. Before  preparing, cooking, or serving food. While caring for a sick person or while visiting someone in a hospital. Rest at home while you recover. Take a warm bath to relieve any burning or pain from frequent diarrhea episodes. Watch your condition for any changes. Contact a health care provider if: You have a fever. Your diarrhea gets worse. You have new symptoms. You vomit every time you eat or drink. You feel light-headed, dizzy, or have a headache. You have muscle cramps. You have signs of dehydration, such as: Dark urine, very little urine, or no urine. Cracked lips. Dry mouth. Sunken eyes. Sleepiness. Weakness. You have bloody or black stools or stools that look like tar. You have severe pain, cramping, or bloating in your abdomen. Your skin feels cold and clammy. You feel confused. Get help right away if: You have chest pain or your heart is beating very quickly. You have trouble breathing or you are breathing very quickly. You feel extremely weak or you faint. These symptoms may be an emergency. Get help right away. Call 911. Do not wait to see if the symptoms will go away. Do not drive yourself to the hospital. This information is not intended to replace advice given to you by your health care provider. Make sure you discuss any questions you have with your health care provider. Document Revised: 11/04/2021 Document Reviewed: 11/04/2021 Elsevier Patient Education  2024 [employer] Dehydration, Adult Dehydration is a condition in which there is not enough water or other fluids in the body. This happens when a person loses more fluids than they take in. Important organs  cannot work right without the right amount of fluids. Any loss of fluids from the body can cause dehydration. Dehydration can be mild, worse, or very bad. It should be treated right away to keep it from getting very bad. What are the causes? Conditions that cause loss of water in the body. They  include: Watery poop (diarrhea). Vomiting. Sweating a lot. Fever. Infection. Peeing (urinating) a lot. Not drinking enough fluids. Certain medicines, such as medicines that take extra fluid out of the body (diuretics). Lack of safe drinking water. Not being able to get enough water and food. What increases the risk? Having a long-term (chronic) illness that has not been treated the right way, such as: Diabetes. Heart disease. Kidney disease. Being 67 years of age or older. Having a disability. Living in a place that is high above the ground or sea (high in altitude). The thinner, drier air causes more fluid loss. Doing exercises that put stress on your body for a long time. Being active when in hot places. What are the signs or symptoms? Symptoms of dehydration depend on how bad it is. Mild or worse dehydration Thirst. Dry lips or dry mouth. Feeling dizzy or light-headed. Muscle cramps. Passing little pee or dark pee. Pee may be the color of tea. Headache. Very bad dehydration Changes in skin. Skin may: Be cold to the touch (clammy). Be blotchy or pale. Not go back to normal right after you pinch it and let it go. Little or no tears, pee, or sweat. Fast breathing. Low blood pressure. Weak pulse. Pulse that is more than 100 beats a minute when you are sitting still. Other changes, such as: Feeling very thirsty. Eyes that look hollow (sunken). Cold hands and feet. Being confused. Being very tired (lethargic) or having trouble waking from sleep. Losing weight. Loss of consciousness. How is this treated? Treatment for this condition depends on how bad your dehydration is. Treatment should start right away. Do not wait until your condition gets very bad. Very bad dehydration is an emergency. You will need to go to a hospital. Mild or worse dehydration can be treated at home. You may be asked to: Drink more fluids. Drink an oral rehydration solution (ORS). This drink  gives you the right amount of fluids, salts, and minerals (electrolytes). Very bad dehydration can be treated: With fluids through an IV tube. By correcting low levels of electrolytes in the body. By treating the problem that caused your dehydration. Follow these instructions at home: Oral rehydration solution If told by your doctor, drink an ORS: Make an ORS. Use instructions on the package. Start by drinking small amounts, about  cup (120 mL) every 5-10 minutes. Slowly drink more until you have had the amount that your doctor said to have.  Eating and drinking  Drink enough clear fluid to keep your pee pale yellow. If you were told to drink an ORS, finish the ORS first. Then, start slowly drinking other clear fluids. Drink fluids such as: Water. Do not drink only water. Doing that can make the salt (sodium) level in your body get too low. Water from ice chips you suck on. Fruit juice that you have added water to (diluted). Low-calorie sports drinks. Eat foods that have the right amounts of salts and minerals, such as bananas, oranges, potatoes, tomatoes, or spinach. Do not drink alcohol. Avoid drinks that have caffeine or sugar. These include:: High-calorie sports drinks. Fruit juice that you did not add water to. Soda. Coffee or energy  drinks. Avoid foods that are greasy or have a lot of fat or sugar. General instructions Take over-the-counter and prescription medicines only as told by your doctor. Do not take sodium tablets. Doing that can make the salt level in your body get too high. Return to your normal activities as told by your doctor. Ask your doctor what activities are safe for you. Keep all follow-up visits. Your doctor may check and change your treatment. Contact a doctor if: You have pain in your belly (abdomen) and the pain: Gets worse. Stays in one place. You have a rash. You have a stiff neck. You get angry or annoyed more easily than normal. You are more  tired or have a harder time waking than normal. You feel weak or dizzy. You feel very thirsty. Get help right away if: You have any symptoms of very bad dehydration. You vomit every time you eat or drink. Your vomiting gets worse, does not go away, or you vomit blood or green stuff. You are getting treatment, but symptoms are getting worse. You have a fever. You have a very bad headache. You have: Diarrhea that gets worse or does not go away. Blood in your poop (stool). This may cause poop to look black and tarry. No pee in 6-8 hours. Only a small amount of pee in 6-8 hours, and the pee is very dark. You have trouble breathing. These symptoms may be an emergency. Get help right away. Call 911. Do not wait to see if the symptoms will go away. Do not drive yourself to the hospital. This information is not intended to replace advice given to you by your health care provider. Make sure you discuss any questions you have with your health care provider. Document Revised: 12/15/2021 Document Reviewed: 12/15/2021 Elsevier Patient Education  2024 [employer].

## 2023-11-05 NOTE — Progress Notes (Signed)
 1500 PATIENT HERE TODAY FOR PUMP D/C. SHE HAS REPORTING HAVING DIARRHEA TODAY SEVERAL TIMES. SHE PRESENTED WITH A VERY LOW BLOOD PRESSURE SEE FLOWSHEET FOR TODAY. SHE ALSO HAS A PLATELET COUNT OF 32. DR. Harles Lied AWARE AND DID NOT WANT THE PATIENT TO HAVE PLATELETS TRANSFUSION. THE PATIENT AGREES. WE WILL SEE THE PATIENT AGAIN ON MONDAY FOR A SHOT FOR HER WHITE BLOOD COUNT. THE PATIENT UNDERSTANDS TO GOT TO THE EMERGENCY ROOM FOR ANY BLEEDING THAT SHE COULD EXPERIENCE FROM LOW PLATELETS.

## 2023-11-08 ENCOUNTER — Inpatient Hospital Stay

## 2023-11-08 ENCOUNTER — Encounter: Payer: Self-pay | Admitting: Oncology

## 2023-11-08 VITALS — BP 122/71 | HR 95 | Temp 98.0°F | Resp 18 | Ht 68.0 in | Wt 144.0 lb

## 2023-11-08 DIAGNOSIS — Z5111 Encounter for antineoplastic chemotherapy: Secondary | ICD-10-CM | POA: Diagnosis not present

## 2023-11-08 DIAGNOSIS — C182 Malignant neoplasm of ascending colon: Secondary | ICD-10-CM

## 2023-11-08 DIAGNOSIS — C189 Malignant neoplasm of colon, unspecified: Secondary | ICD-10-CM

## 2023-11-08 DIAGNOSIS — C7800 Secondary malignant neoplasm of unspecified lung: Secondary | ICD-10-CM

## 2023-11-08 MED ORDER — FILGRASTIM-SNDZ 480 MCG/0.8ML IJ SOSY
480.0000 ug | PREFILLED_SYRINGE | Freq: Once | INTRAMUSCULAR | Status: AC
  Administered 2023-11-08: 480 ug via SUBCUTANEOUS
  Filled 2023-11-08: qty 0.8

## 2023-11-09 ENCOUNTER — Inpatient Hospital Stay

## 2023-11-09 VITALS — BP 142/85 | HR 89 | Temp 97.5°F | Resp 16

## 2023-11-09 DIAGNOSIS — C189 Malignant neoplasm of colon, unspecified: Secondary | ICD-10-CM

## 2023-11-09 DIAGNOSIS — C182 Malignant neoplasm of ascending colon: Secondary | ICD-10-CM

## 2023-11-09 DIAGNOSIS — Z5111 Encounter for antineoplastic chemotherapy: Secondary | ICD-10-CM | POA: Diagnosis not present

## 2023-11-09 MED ORDER — FILGRASTIM-SNDZ 480 MCG/0.8ML IJ SOSY
480.0000 ug | PREFILLED_SYRINGE | Freq: Once | INTRAMUSCULAR | Status: AC
  Administered 2023-11-09: 480 ug via SUBCUTANEOUS
  Filled 2023-11-09: qty 0.8

## 2023-11-09 MED ORDER — SODIUM CHLORIDE 0.9 % IV SOLN: Freq: Once | INTRAVENOUS | Status: AC

## 2023-11-09 NOTE — Patient Instructions (Signed)
 Filgrastim Injection What is this medication? FILGRASTIM (fil GRA stim) lowers the risk of infection in people who are receiving chemotherapy. It works by Systems analyst make more white blood cells, which protects your body from infection. It may also be used to help people who have been exposed to high doses of radiation. It can be used to help prepare your body before a stem cell transplant. It works by helping your bone marrow make and release stem cells into the blood. This medicine may be used for other purposes; ask your health care provider or pharmacist if you have questions. COMMON BRAND NAME(S): Neupogen, Nivestym, Nypozi, Releuko, Zarxio What should I tell my care team before I take this medication? They need to know if you have any of these conditions: History of blood diseases, such as sickle cell anemia Kidney disease Recent or ongoing radiation An unusual or allergic reaction to filgrastim, pegfilgrastim, latex, rubber, other medications, foods, dyes, or preservatives Pregnant or trying to get pregnant Breast-feeding How should I use this medication? This medication is injected under the skin or into a vein. It is usually given by your care team in a hospital or clinic setting. It may be given at home. If you get this medication at home, you will be taught how to prepare and give it. Use exactly as directed. Take it as directed on the prescription label at the same time every day. Keep taking it unless your care team tells you to stop. It is important that you put your used needles and syringes in a special sharps container. Do not put them in a trash can. If you do not have a sharps container, call your pharmacist or care team to get one. This medication comes with INSTRUCTIONS FOR USE. Ask your pharmacist for directions on how to use this medication. Read the information carefully. Talk to your pharmacist or care team if you have questions. Talk to your care team about the use of  this medication in children. While it may be prescribed for children for selected conditions, precautions do apply. Overdosage: If you think you have taken too much of this medicine contact a poison control center or emergency room at once. NOTE: This medicine is only for you. Do not share this medicine with others. What if I miss a dose? It is important not to miss any doses. Talk to your care team about what to do if you miss a dose. What may interact with this medication? Medications that may cause a release of neutrophils, such as lithium This list may not describe all possible interactions. Give your health care provider a list of all the medicines, herbs, non-prescription drugs, or dietary supplements you use. Also tell them if you smoke, drink alcohol, or use illegal drugs. Some items may interact with your medicine. What should I watch for while using this medication? Your condition will be monitored carefully while you are receiving this medication. You may need bloodwork while taking this medication. Talk to your care team about your risk of cancer. You may be more at risk for certain types of cancer if you take this medication. What side effects may I notice from receiving this medication? Side effects that you should report to your care team as soon as possible: Allergic reactions--skin rash, itching, hives, swelling of the face, lips, tongue, or throat Capillary leak syndrome--stomach or muscle pain, unusual weakness or fatigue, feeling faint or lightheaded, decrease in the amount of urine, swelling of the ankles, hands,  or feet, trouble breathing High white blood cell level--fever, fatigue, trouble breathing, night sweats, change in vision, weight loss Inflammation of the aorta--fever, fatigue, back, chest, or stomach pain, severe headache Kidney injury (glomerulonephritis)--decrease in the amount of urine, red or dark brown urine, foamy or bubbly urine, swelling of the ankles, hands,  or feet Shortness of breath or trouble breathing Spleen injury--pain in upper left stomach or shoulder Unusual bruising or bleeding Side effects that usually do not require medical attention (report to your care team if they continue or are bothersome): Back pain Bone pain Fatigue Fever Headache Nausea This list may not describe all possible side effects. Call your doctor for medical advice about side effects. You may report side effects to FDA at 1-800-FDA-1088. Where should I keep my medication? Keep out of the reach of children and pets. Keep this medication in the original packaging until you are ready to take it. Protect from light. See product for storage information. Each product may have different instructions. Get rid of any unused medication after the expiration date. To get rid of medications that are no longer needed or have expired: Take the medication to a medications take-back program. Check with your pharmacy or law enforcement to find a location. If you cannot return the medication, ask your pharmacist or care team how to get rid of this medication safely. NOTE: This sheet is a summary. It may not cover all possible information. If you have questions about this medicine, talk to your doctor, pharmacist, or health care provider.  2024 Elsevier/[employer] Standard (2021-10-09 00:00:00)

## 2023-11-09 NOTE — Progress Notes (Signed)
 Patient reports dizziness that increases with standing or walking- Dr Harles Lied aware, orders received and carried out.

## 2023-11-15 ENCOUNTER — Inpatient Hospital Stay

## 2023-11-15 ENCOUNTER — Encounter: Payer: Self-pay | Admitting: Oncology

## 2023-11-15 VITALS — BP 130/79 | HR 95 | Temp 98.7°F | Resp 20 | Ht 68.0 in | Wt 149.1 lb

## 2023-11-15 DIAGNOSIS — C182 Malignant neoplasm of ascending colon: Secondary | ICD-10-CM

## 2023-11-15 DIAGNOSIS — C189 Malignant neoplasm of colon, unspecified: Secondary | ICD-10-CM

## 2023-11-15 DIAGNOSIS — Z5111 Encounter for antineoplastic chemotherapy: Secondary | ICD-10-CM | POA: Diagnosis not present

## 2023-11-15 MED ORDER — FILGRASTIM-SNDZ 480 MCG/0.8ML IJ SOSY
480.0000 ug | PREFILLED_SYRINGE | Freq: Once | INTRAMUSCULAR | Status: AC
  Administered 2023-11-15: 480 ug via SUBCUTANEOUS
  Filled 2023-11-15: qty 0.8

## 2023-11-15 NOTE — Patient Instructions (Signed)
 Filgrastim Injection What is this medication? FILGRASTIM (fil GRA stim) lowers the risk of infection in people who are receiving chemotherapy. It works by Systems analyst make more white blood cells, which protects your body from infection. It may also be used to help people who have been exposed to high doses of radiation. It can be used to help prepare your body before a stem cell transplant. It works by helping your bone marrow make and release stem cells into the blood. This medicine may be used for other purposes; ask your health care provider or pharmacist if you have questions. COMMON BRAND NAME(S): Neupogen, Nivestym, Nypozi, Releuko, Zarxio What should I tell my care team before I take this medication? They need to know if you have any of these conditions: History of blood diseases, such as sickle cell anemia Kidney disease Recent or ongoing radiation An unusual or allergic reaction to filgrastim, pegfilgrastim, latex, rubber, other medications, foods, dyes, or preservatives Pregnant or trying to get pregnant Breast-feeding How should I use this medication? This medication is injected under the skin or into a vein. It is usually given by your care team in a hospital or clinic setting. It may be given at home. If you get this medication at home, you will be taught how to prepare and give it. Use exactly as directed. Take it as directed on the prescription label at the same time every day. Keep taking it unless your care team tells you to stop. It is important that you put your used needles and syringes in a special sharps container. Do not put them in a trash can. If you do not have a sharps container, call your pharmacist or care team to get one. This medication comes with INSTRUCTIONS FOR USE. Ask your pharmacist for directions on how to use this medication. Read the information carefully. Talk to your pharmacist or care team if you have questions. Talk to your care team about the use of  this medication in children. While it may be prescribed for children for selected conditions, precautions do apply. Overdosage: If you think you have taken too much of this medicine contact a poison control center or emergency room at once. NOTE: This medicine is only for you. Do not share this medicine with others. What if I miss a dose? It is important not to miss any doses. Talk to your care team about what to do if you miss a dose. What may interact with this medication? Medications that may cause a release of neutrophils, such as lithium This list may not describe all possible interactions. Give your health care provider a list of all the medicines, herbs, non-prescription drugs, or dietary supplements you use. Also tell them if you smoke, drink alcohol, or use illegal drugs. Some items may interact with your medicine. What should I watch for while using this medication? Your condition will be monitored carefully while you are receiving this medication. You may need bloodwork while taking this medication. Talk to your care team about your risk of cancer. You may be more at risk for certain types of cancer if you take this medication. What side effects may I notice from receiving this medication? Side effects that you should report to your care team as soon as possible: Allergic reactions--skin rash, itching, hives, swelling of the face, lips, tongue, or throat Capillary leak syndrome--stomach or muscle pain, unusual weakness or fatigue, feeling faint or lightheaded, decrease in the amount of urine, swelling of the ankles, hands,  or feet, trouble breathing High white blood cell level--fever, fatigue, trouble breathing, night sweats, change in vision, weight loss Inflammation of the aorta--fever, fatigue, back, chest, or stomach pain, severe headache Kidney injury (glomerulonephritis)--decrease in the amount of urine, red or dark brown urine, foamy or bubbly urine, swelling of the ankles, hands,  or feet Shortness of breath or trouble breathing Spleen injury--pain in upper left stomach or shoulder Unusual bruising or bleeding Side effects that usually do not require medical attention (report to your care team if they continue or are bothersome): Back pain Bone pain Fatigue Fever Headache Nausea This list may not describe all possible side effects. Call your doctor for medical advice about side effects. You may report side effects to FDA at 1-800-FDA-1088. Where should I keep my medication? Keep out of the reach of children and pets. Keep this medication in the original packaging until you are ready to take it. Protect from light. See product for storage information. Each product may have different instructions. Get rid of any unused medication after the expiration date. To get rid of medications that are no longer needed or have expired: Take the medication to a medications take-back program. Check with your pharmacy or law enforcement to find a location. If you cannot return the medication, ask your pharmacist or care team how to get rid of this medication safely. NOTE: This sheet is a summary. It may not cover all possible information. If you have questions about this medicine, talk to your doctor, pharmacist, or health care provider.  2024 Elsevier/[employer] Standard (2021-10-09 00:00:00)

## 2023-11-23 ENCOUNTER — Other Ambulatory Visit

## 2023-11-23 DIAGNOSIS — C182 Malignant neoplasm of ascending colon: Secondary | ICD-10-CM | POA: Diagnosis not present

## 2023-11-23 DIAGNOSIS — C787 Secondary malignant neoplasm of liver and intrahepatic bile duct: Secondary | ICD-10-CM | POA: Diagnosis not present

## 2023-11-23 LAB — LAB REPORT - SCANNED: EGFR: 48

## 2023-11-23 NOTE — Progress Notes (Unsigned)
 [facility]  [address] [place],  [place]  [address] [phone]  Clinic Day:  11/03/2023  Referring physician: Ezzard Valaria LABOR, MD  HISTORY OF PRESENT ILLNESS:  The patient is a 58 y.o. female with metastatic colon cancer, which includes spread of disease to her abdominal cavity, lungs, brain and right adrenal gland.  She comes in today to go over her CT scans to ascertain her new disease baseline after 25 cycles of palliative chemotherapy, which consists of FOLFOX/Avastin .    Since her last visit, the patient has been doing fairly well.  She does complain of steady fatigue, likely related to her numerous years of chemotherapy.  Although she still has diarrhea, it has not been prominent over the past few weeks.  She also has intermittent nausea.  However, she denies having any abdominal pain at this time or other new GI symptoms/findings which concern her for overt signs of disease progression.    With respect to her colon cancer history, she is status post a right hemicolectomy in early October 2018, followed by 12 cycles of adjuvant FOLFOX chemotherapy, which were completed in April 2019.  In December 2021, she underwent a left cerebellar metastasectomy, whose pathology was consistent with metastatic colon cancer.  Tumor testing did come back MMR normal. CT scans revealed evidence of her cancer being in multiple locations, for which she took 40 cycles of FOLFIRI/Avastin .  As follow-up scans revealed liver metastasis, she was placed back on FOLFOX/Avastin , for which she continues to take.  PHYSICAL EXAM:  Last menstrual period 02/22/2019. There is no height or weight on file to calculate BMI.  Performance status (ECOG): 1 - Symptomatic but completely ambulatory  Physical Exam Vitals and nursing note reviewed.  Constitutional:      General: She is not in acute distress.    Appearance: Normal appearance.  HENT:     Head: Normocephalic and atraumatic.      Mouth/Throat:     Mouth: Mucous membranes are moist.     Pharynx: Oropharynx is clear. No oropharyngeal exudate or posterior oropharyngeal erythema.   Eyes:     General: No scleral icterus.    Extraocular Movements: Extraocular movements intact.     Conjunctiva/sclera: Conjunctivae normal.     Pupils: Pupils are equal, round, and reactive to light.    Cardiovascular:     Rate and Rhythm: Normal rate and regular rhythm.     Heart sounds: Normal heart sounds. No murmur heard.    No friction rub. No gallop.  Pulmonary:     Effort: Pulmonary effort is normal.     Breath sounds: Normal breath sounds. No wheezing, rhonchi or rales.  Abdominal:     General: There is no distension.     Palpations: Abdomen is soft. There is no hepatomegaly, splenomegaly or mass.     Tenderness: There is no abdominal tenderness.   Musculoskeletal:        General: Normal range of motion.     Cervical back: Normal range of motion and neck supple. No tenderness.     Right lower leg: No edema.     Left lower leg: No edema.  Lymphadenopathy:     Cervical: No cervical adenopathy.     Upper Body:     Right upper body: No supraclavicular or axillary adenopathy.     Left upper body: No supraclavicular or axillary adenopathy.     Lower Body: No right inguinal adenopathy. No left inguinal adenopathy.  Skin:    General: Skin is warm and dry.     Coloration: Skin is not jaundiced.     Findings: No rash.   Neurological:     Mental Status: She is alert and oriented to person, place, and time.     Cranial Nerves: No cranial nerve deficit.   Psychiatric:        Mood and Affect: Mood normal.        Behavior: Behavior normal.        Thought Content: Thought content normal.    LABS:      Latest Ref Rng & Units 11/05/2023    3:20 PM 11/03/2023   10:55 AM 10/11/2023   10:27 AM  CBC  WBC 4.0 - 10.5 K/uL 3.2  3.7  3.2   Hemoglobin 12.0 - 15.0 g/dL 87.3  87.3  88.0   Hematocrit 36.0 - 46.0 % 38.7  40.1   37.5   Platelets 150 - 400 K/uL 32  46  41       Latest Ref Rng & Units 11/03/2023   10:55 AM 10/11/2023   10:27 AM 08/30/2023    9:23 AM  CMP  Glucose 70 - 99 mg/dL 873  746  773   BUN 6 - 20 mg/dL 17  17  12    Creatinine 0.44 - 1.00 mg/dL 8.53  8.74  8.75   Sodium 135 - 145 mmol/L 141  141  141   Potassium 3.5 - 5.1 mmol/L 4.2  4.7  4.4   Chloride 98 - 111 mmol/L 103  102  102   CO2 22 - 32 mmol/L 26  29  29    Calcium  8.9 - 10.3 mg/dL 9.8  9.8  9.4   Total Protein 6.5 - 8.1 g/dL 6.5  5.9  5.7   Total Bilirubin 0.0 - 1.2 mg/dL 0.5  0.3  0.4   Alkaline Phos 38 - 126 U/L 227  188  198   AST 15 - 41 U/L 31  26  37   ALT 0 - 44 U/L 18  16  42    ASSESSMENT & PLAN:  Assessment/Plan:  A 58 y.o. female with metastatic colon cancer. She will proceed with her 25th cycle of FOLFOX/Avastin  today, which she now receives every 3 weeks.  With respect to her thrombocytopenia, as her platelets are above 40 she does not have any bleeding issues, a platelet pheresis will not be done this week.  As she believes her nausea is slightly more pronounced, I will prescribe her olanzapine  5 mg to see if this will help over time.  Otherwise, she clinically appears to be doing well.  I will see her back in 3 weeks before she heads into her 26th cycle of FOLFOX/Avastin .  Repeat CT scans will be done a day before her next visit to ascertain her new disease baseline after 25 cycles of FOLFOX/Avastin .  The patient understands all the plans discussed today and is in agreement with them.  Evoleht Hovatter DELENA Kerns, MD

## 2023-11-24 ENCOUNTER — Other Ambulatory Visit: Payer: Self-pay | Admitting: Pharmacist

## 2023-11-24 ENCOUNTER — Encounter: Payer: Self-pay | Admitting: Oncology

## 2023-11-24 ENCOUNTER — Inpatient Hospital Stay

## 2023-11-24 ENCOUNTER — Telehealth: Payer: Self-pay | Admitting: Oncology

## 2023-11-24 ENCOUNTER — Inpatient Hospital Stay (HOSPITAL_BASED_OUTPATIENT_CLINIC_OR_DEPARTMENT_OTHER): Admitting: Oncology

## 2023-11-24 VITALS — BP 119/82 | HR 88 | Temp 98.2°F | Resp 14 | Ht 68.0 in | Wt 148.0 lb

## 2023-11-24 DIAGNOSIS — C182 Malignant neoplasm of ascending colon: Secondary | ICD-10-CM

## 2023-11-24 DIAGNOSIS — E119 Type 2 diabetes mellitus without complications: Secondary | ICD-10-CM | POA: Diagnosis not present

## 2023-11-24 DIAGNOSIS — C7931 Secondary malignant neoplasm of brain: Secondary | ICD-10-CM

## 2023-11-24 DIAGNOSIS — Z794 Long term (current) use of insulin: Secondary | ICD-10-CM | POA: Diagnosis not present

## 2023-11-24 DIAGNOSIS — Z5111 Encounter for antineoplastic chemotherapy: Secondary | ICD-10-CM | POA: Diagnosis not present

## 2023-11-24 DIAGNOSIS — C189 Malignant neoplasm of colon, unspecified: Secondary | ICD-10-CM

## 2023-11-24 LAB — TOTAL PROTEIN, URINE DIPSTICK: Protein, ur: NEGATIVE mg/dL

## 2023-11-24 MED ORDER — DEXTROSE 5 % IV SOLN
Freq: Once | INTRAVENOUS | Status: DC
Start: 2023-11-24 — End: 2023-11-24

## 2023-11-24 MED ORDER — FLUOROURACIL CHEMO INJECTION 2.5 GM/50ML
400.0000 mg/m2 | Freq: Once | INTRAVENOUS | Status: AC
  Administered 2023-11-24: 700 mg via INTRAVENOUS
  Filled 2023-11-24: qty 14

## 2023-11-24 MED ORDER — SODIUM CHLORIDE 0.9 % IV SOLN
150.0000 mg | Freq: Once | INTRAVENOUS | Status: AC
  Administered 2023-11-24: 150 mg via INTRAVENOUS
  Filled 2023-11-24: qty 150

## 2023-11-24 MED ORDER — DEXTROSE 5 % IV SOLN: Freq: Once | INTRAVENOUS | Status: AC

## 2023-11-24 MED ORDER — FAMOTIDINE IN NACL 20-0.9 MG/50ML-% IV SOLN
20.0000 mg | Freq: Once | INTRAVENOUS | Status: AC
  Administered 2023-11-24: 20 mg via INTRAVENOUS
  Filled 2023-11-24: qty 50

## 2023-11-24 MED ORDER — SODIUM CHLORIDE 0.9 % IV SOLN
2400.0000 mg/m2 | INTRAVENOUS | Status: DC
  Administered 2023-11-24: 4350 mg via INTRAVENOUS
  Filled 2023-11-24: qty 87

## 2023-11-24 MED ORDER — DIPHENHYDRAMINE HCL 50 MG/ML IJ SOLN
25.0000 mg | Freq: Once | INTRAMUSCULAR | Status: AC
  Administered 2023-11-24: 25 mg via INTRAVENOUS
  Filled 2023-11-24: qty 1

## 2023-11-24 MED ORDER — SODIUM CHLORIDE 0.9 % IV SOLN
Freq: Once | INTRAVENOUS | Status: AC
Start: 2023-11-24 — End: 2023-11-24

## 2023-11-24 MED ORDER — PALONOSETRON HCL INJECTION 0.25 MG/5ML
0.2500 mg | Freq: Once | INTRAVENOUS | Status: AC
  Administered 2023-11-24: 0.25 mg via INTRAVENOUS
  Filled 2023-11-24: qty 5

## 2023-11-24 MED ORDER — DEXAMETHASONE SODIUM PHOSPHATE 10 MG/ML IJ SOLN
10.0000 mg | Freq: Once | INTRAMUSCULAR | Status: AC
  Administered 2023-11-24: 10 mg via INTRAVENOUS
  Filled 2023-11-24: qty 1

## 2023-11-24 MED ORDER — OXALIPLATIN CHEMO INJECTION 50 MG/10ML
63.7500 mg/m2 | Freq: Once | INTRAVENOUS | Status: AC
  Administered 2023-11-24: 115 mg via INTRAVENOUS
  Filled 2023-11-24: qty 20

## 2023-11-24 MED ORDER — SODIUM CHLORIDE 0.9 % IV SOLN
5.0000 mg/kg | Freq: Once | INTRAVENOUS | Status: AC
  Administered 2023-11-24: 350 mg via INTRAVENOUS
  Filled 2023-11-24: qty 14

## 2023-11-24 NOTE — Progress Notes (Signed)
 LABS 11/23/23 at [facility] HEALTH:  HGB=11.4 PLT=40 ANC=1.76 SCR=1.30 ALT=39 AST=46 Bili=0.3 KCL=4.7

## 2023-11-24 NOTE — Progress Notes (Signed)
 Ok to proceed with bevacizumab  today without urine protein since patient is unable to provide sample.

## 2023-11-24 NOTE — Progress Notes (Signed)
 Per Dr. Ezzard okay to treat with Platelets of 40

## 2023-11-24 NOTE — Telephone Encounter (Signed)
 Patient has been scheduled for follow-up visit per 11/24/23 LOS.  Pt given an appt calendar with date and time.

## 2023-11-24 NOTE — Patient Instructions (Signed)
 The chemotherapy medication bag should finish at 46 hours, For example, if your pump is scheduled for 46 hours and it was put on at 4:00 p.m., it should finish at 2:00 p.m. the day it is scheduled to come off regardless of your appointment time.     Estimated time to finish at 2pm.   If the display on your pump reads Low Volume and it is beeping, take the batteries out of the pump and come to the cancer center for it to be taken off.   If the pump alarms go off prior to the pump reading Low Volume then call [phone] and someone can assist you.  If the plunger comes out and the chemotherapy medication is leaking out, please use your home chemo spill kit to clean up the spill. Do NOT use paper towels or other household products.  If you have problems or questions regarding your pump, please call either [phone] (24 hours a day) or the cancer center Monday-Friday 8:00 a.m.- 4:30 p.m. at the clinic number and we will assist you. If you are unable to get assistance, then go to the nearest Emergency Department and ask the staff to contact the IV team for assistance.   Bevacizumab  Injection What is this medication? BEVACIZUMAB  (be va SIZ yoo mab) treats some types of cancer. It works by blocking a protein that causes cancer cells to grow and multiply. This helps to slow or stop the spread of cancer cells. It is a monoclonal antibody. This medicine may be used for other purposes; ask your health care provider or pharmacist if you have questions. COMMON BRAND NAME(S): Alymsys , Avastin , MVASI , Vegzalma, Zirabev  What should I tell my care team before I take this medication? They need to know if you have any of these conditions: Blood clots Coughing up blood Having or recent surgery Heart failure High blood pressure History of a connection between 2 or more body parts that do not usually connect (fistula) History of a tear in your stomach or intestines Protein in your urine An unusual  or allergic reaction to bevacizumab , other medications, foods, dyes, or preservatives Pregnant or trying to get pregnant Breast-feeding How should I use this medication? This medication is injected into a vein. It is given by your care team in a hospital or clinic setting. Talk to your care team the use of this medication in children. Special care may be needed. Overdosage: If you think you have taken too much of this medicine contact a poison control center or emergency room at once. NOTE: This medicine is only for you. Do not share this medicine with others. What if I miss a dose? Keep appointments for follow-up doses. It is important not to miss your dose. Call your care team if you are unable to keep an appointment. What may interact with this medication? Interactions are not expected. This list may not describe all possible interactions. Give your health care provider a list of all the medicines, herbs, non-prescription drugs, or dietary supplements you use. Also tell them if you smoke, drink alcohol, or use illegal drugs. Some items may interact with your medicine. What should I watch for while using this medication? Your condition will be monitored carefully while you are receiving this medication. You may need blood work while taking this medication. This medication may make you feel generally unwell. This is not uncommon as chemotherapy can affect healthy cells as well as cancer cells. Report any side effects. Continue your course of treatment even  though you feel ill unless your care team tells you to stop. This medication may increase your risk to bruise or bleed. Call your care team if you notice any unusual bleeding. Before having surgery, talk to your care team to make sure it is ok. This medication can increase the risk of poor healing of your surgical site or wound. You will need to stop this medication for 28 days before surgery. After surgery, wait at least 28 days before  restarting this medication. Make sure the surgical site or wound is healed enough before restarting this medication. Talk to your care team if questions. Talk to your care team if you may be pregnant. Serious birth defects can occur if you take this medication during pregnancy and for 6 months after the last dose. Contraception is recommended while taking this medication and for 6 months after the last dose. Your care team can help you find the option that works for you. Do not breastfeed while taking this medication and for 6 months after the last dose. This medication can cause infertility. Talk to your care team if you are concerned about your fertility. What side effects may I notice from receiving this medication? Side effects that you should report to your care team as soon as possible: Allergic reactions--skin rash, itching, hives, swelling of the face, lips, tongue, or throat Bleeding--bloody or black, tar-like stools, vomiting blood or brown material that looks like coffee grounds, red or dark brown urine, small red or purple spots on skin, unusual bruising or bleeding Blood clot--pain, swelling, or warmth in the leg, shortness of breath, chest pain Heart attack--pain or tightness in the chest, shoulders, arms, or jaw, nausea, shortness of breath, cold or clammy skin, feeling faint or lightheaded Heart failure--shortness of breath, swelling of the ankles, feet, or hands, sudden weight gain, unusual weakness or fatigue Increase in blood pressure Infection--fever, chills, cough, sore throat, wounds that don't heal, pain or trouble when passing urine, general feeling of discomfort or being unwell Infusion reactions--chest pain, shortness of breath or trouble breathing, feeling faint or lightheaded Kidney injury--decrease in the amount of urine, swelling of the ankles, hands, or feet Stomach pain that is severe, does not go away, or gets worse Stroke--sudden numbness or weakness of the face,  arm, or leg, trouble speaking, confusion, trouble walking, loss of balance or coordination, dizziness, severe headache, change in vision Sudden and severe headache, confusion, change in vision, seizures, which may be signs of posterior reversible encephalopathy syndrome (PRES) Side effects that usually do not require medical attention (report to your care team if they continue or are bothersome): Back pain Change in taste Diarrhea Dry skin Increased tears Nosebleed This list may not describe all possible side effects. Call your doctor for medical advice about side effects. You may report side effects to FDA at 1-800-FDA-1088. Where should I keep my medication? This medication is given in a hospital or clinic. It will not be stored at home. NOTE: This sheet is a summary. It may not cover all possible information. If you have questions about this medicine, talk to your doctor, pharmacist, or health care provider.  2024 [employer]/Gold Standard (2021-10-03 00:00:00)Oxaliplatin  Injection What is this medication? OXALIPLATIN  (ox AL i PLA tin) treats colorectal cancer. It works by slowing down the growth of cancer cells. This medicine may be used for other purposes; ask your health care provider or pharmacist if you have questions. COMMON BRAND NAME(S): Eloxatin  What should I tell my care team before I  take this medication? They need to know if you have any of these conditions: Heart disease History of irregular heartbeat or rhythm Liver disease Low blood cell levels (white cells, red cells, and platelets) Lung or breathing disease, such as asthma Take medications that treat or prevent blood clots Tingling of the fingers, toes, or other nerve disorder An unusual or allergic reaction to oxaliplatin , other medications, foods, dyes, or preservatives If you or your partner are pregnant or trying to get pregnant Breast-feeding How should I use this medication? This medication is injected into a  vein. It is given by your care team in a hospital or clinic setting. Talk to your care team about the use of this medication in children. Special care may be needed. Overdosage: If you think you have taken too much of this medicine contact a poison control center or emergency room at once. NOTE: This medicine is only for you. Do not share this medicine with others. What if I miss a dose? Keep appointments for follow-up doses. It is important not to miss a dose. Call your care team if you are unable to keep an appointment. What may interact with this medication? Do not take this medication with any of the following: Cisapride Dronedarone Pimozide Thioridazine This medication may also interact with the following: Aspirin and aspirin-like medications Certain medications that treat or prevent blood clots, such as warfarin, apixaban, dabigatran, and rivaroxaban Cisplatin Cyclosporine Diuretics Medications for infection, such as acyclovir, adefovir, amphotericin B, bacitracin , cidofovir, foscarnet, ganciclovir, gentamicin, pentamidine, vancomycin  NSAIDs, medications for pain and inflammation, such as ibuprofen or naproxen Other medications that cause heart rhythm changes Pamidronate Zoledronic acid This list may not describe all possible interactions. Give your health care provider a list of all the medicines, herbs, non-prescription drugs, or dietary supplements you use. Also tell them if you smoke, drink alcohol, or use illegal drugs. Some items may interact with your medicine. What should I watch for while using this medication? Your condition will be monitored carefully while you are receiving this medication. You may need blood work while taking this medication. This medication may make you feel generally unwell. This is not uncommon as chemotherapy can affect healthy cells as well as cancer cells. Report any side effects. Continue your course of treatment even though you feel ill unless  your care team tells you to stop. This medication may increase your risk of getting an infection. Call your care team for advice if you get a fever, chills, sore throat, or other symptoms of a cold or flu. Do not treat yourself. Try to avoid being around people who are sick. Avoid taking medications that contain aspirin, acetaminophen , ibuprofen, naproxen, or ketoprofen unless instructed by your care team. These medications may hide a fever. Be careful brushing or flossing your teeth or using a toothpick because you may get an infection or bleed more easily. If you have any dental work done, tell your dentist you are receiving this medication. This medication can make you more sensitive to cold. Do not drink cold drinks or use ice. Cover exposed skin before coming in contact with cold temperatures or cold objects. When out in cold weather wear warm clothing and cover your mouth and nose to warm the air that goes into your lungs. Tell your care team if you get sensitive to the cold. Talk to your care team if you or your partner are pregnant or think either of you might be pregnant. This medication can cause serious birth defects  if taken during pregnancy and for 9 months after the last dose. A negative pregnancy test is required before starting this medication. A reliable form of contraception is recommended while taking this medication and for 9 months after the last dose. Talk to your care team about effective forms of contraception. Do not father a child while taking this medication and for 6 months after the last dose. Use a condom while having sex during this time period. Do not breastfeed while taking this medication and for 3 months after the last dose. This medication may cause infertility. Talk to your care team if you are concerned about your fertility. What side effects may I notice from receiving this medication? Side effects that you should report to your care team as soon as possible: Allergic  reactions--skin rash, itching, hives, swelling of the face, lips, tongue, or throat Bleeding--bloody or black, tar-like stools, vomiting blood or brown material that looks like coffee grounds, red or dark brown urine, small red or purple spots on skin, unusual bruising or bleeding Dry cough, shortness of breath or trouble breathing Heart rhythm changes--fast or irregular heartbeat, dizziness, feeling faint or lightheaded, chest pain, trouble breathing Infection--fever, chills, cough, sore throat, wounds that don't heal, pain or trouble when passing urine, general feeling of discomfort or being unwell Liver injury--right upper belly pain, loss of appetite, nausea, light-colored stool, dark yellow or brown urine, yellowing skin or eyes, unusual weakness or fatigue Low red blood cell level--unusual weakness or fatigue, dizziness, headache, trouble breathing Muscle injury--unusual weakness or fatigue, muscle pain, dark yellow or brown urine, decrease in amount of urine Pain, tingling, or numbness in the hands or feet Sudden and severe headache, confusion, change in vision, seizures, which may be signs of posterior reversible encephalopathy syndrome (PRES) Unusual bruising or bleeding Side effects that usually do not require medical attention (report to your care team if they continue or are bothersome): Diarrhea Nausea Pain, redness, or swelling with sores inside the mouth or throat Unusual weakness or fatigue Vomiting This list may not describe all possible side effects. Call your doctor for medical advice about side effects. You may report side effects to FDA at 1-800-FDA-1088. Where should I keep my medication? This medication is given in a hospital or clinic. It will not be stored at home. NOTE: This sheet is a summary. It may not cover all possible information. If you have questions about this medicine, talk to your doctor, pharmacist, or health care provider.  2024 [employer]/Gold Standard  (2023-04-30 00:00:00)

## 2023-11-26 ENCOUNTER — Inpatient Hospital Stay

## 2023-11-26 VITALS — BP 114/77 | HR 96 | Temp 98.2°F | Resp 18

## 2023-11-26 DIAGNOSIS — Z5111 Encounter for antineoplastic chemotherapy: Secondary | ICD-10-CM | POA: Diagnosis not present

## 2023-11-26 DIAGNOSIS — C189 Malignant neoplasm of colon, unspecified: Secondary | ICD-10-CM

## 2023-11-26 DIAGNOSIS — C182 Malignant neoplasm of ascending colon: Secondary | ICD-10-CM

## 2023-11-26 MED ORDER — HEPARIN SOD (PORK) LOCK FLUSH 100 UNIT/ML IV SOLN
500.0000 [IU] | Freq: Once | INTRAVENOUS | Status: AC | PRN
  Administered 2023-11-26: 500 [IU]

## 2023-11-26 MED ORDER — PEGFILGRASTIM 6 MG/0.6ML ~~LOC~~ PSKT
6.0000 mg | PREFILLED_SYRINGE | Freq: Once | SUBCUTANEOUS | Status: AC
  Administered 2023-11-26: 6 mg via SUBCUTANEOUS
  Filled 2023-11-26: qty 0.6

## 2023-11-26 MED ORDER — SODIUM CHLORIDE 0.9% FLUSH
10.0000 mL | INTRAVENOUS | Status: DC | PRN
  Administered 2023-11-26: 10 mL

## 2023-11-26 NOTE — Patient Instructions (Signed)

## 2023-11-30 ENCOUNTER — Ambulatory Visit

## 2023-12-01 ENCOUNTER — Ambulatory Visit

## 2023-12-02 ENCOUNTER — Ambulatory Visit

## 2023-12-06 ENCOUNTER — Encounter: Payer: Self-pay | Admitting: Psychiatry

## 2023-12-06 ENCOUNTER — Encounter: Payer: Self-pay | Admitting: Oncology

## 2023-12-06 ENCOUNTER — Ambulatory Visit: Admitting: Psychiatry

## 2023-12-06 DIAGNOSIS — F422 Mixed obsessional thoughts and acts: Secondary | ICD-10-CM | POA: Diagnosis not present

## 2023-12-06 DIAGNOSIS — R530 Neoplastic (malignant) related fatigue: Secondary | ICD-10-CM | POA: Diagnosis not present

## 2023-12-06 DIAGNOSIS — F333 Major depressive disorder, recurrent, severe with psychotic symptoms: Secondary | ICD-10-CM

## 2023-12-06 DIAGNOSIS — G3184 Mild cognitive impairment, so stated: Secondary | ICD-10-CM

## 2023-12-06 DIAGNOSIS — G4721 Circadian rhythm sleep disorder, delayed sleep phase type: Secondary | ICD-10-CM

## 2023-12-06 MED ORDER — MODAFINIL 200 MG PO TABS: ORAL_TABLET | ORAL | 5 refills | Status: DC

## 2023-12-06 MED ORDER — CARIPRAZINE HCL 3 MG PO CAPS: ORAL_CAPSULE | ORAL | 0 refills | Status: DC

## 2023-12-06 NOTE — Progress Notes (Signed)
 Shannon [name] [id number] 08/02/1965 58 y.o.    Subjective:   Patient ID:  Shannon [name] is a 58 y.o. (DOB Oct 15, 1965) female.  Chief Complaint:  No chief complaint on file.   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  seen November 2020 .  No meds were changed at that time.  10/16/19 the following is noted at this appt: Asked questions about sleep supplement.   Just started GI doctor.   Good overall. No mood swings. No depressive episodes.  Cry more with hormonal changes more easily.   Probably sleep too much to avoid.  Occ stress from work interferes with sleep.  Not as productive from home.  Hopes to get back to the office.  Still working from home.  Boss noticed her forgetfulness with deadlines and missing issues in emails.  Used to be sharper. Wonders if related to meds.  Can usually nap if desired for years and not seasonal.  Only anxiety is Covid related. More poor self care.   Hysterectomy December 2020. No med changes  03/18/2020 appointment with the following noted: July had Covid and both parents and her father died from [place]. Able to help mom with arrangements. Required one iron infusion this year. Never took NAC bc it was too hard to take. Sleep, eating schedule is irregular. More productive at home helps her feel better. Plan: No med changes. 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 For a month had dizzy, falls, NV all of which cleared quickly after removal.  Radiation to brain after this. More CA in lungs and abdomen and having chemo.  Oncologist said she has about 5 years of lifespan left.  MRI and CT pending. Gotten into therapy starting Norleen Rodenbaugh next week.   Pray a lot and good  connection with friends.  Good support.  Trying to do things for fun.   Is working but finding it difficult mentally and physically DT SE fatigue from chemo. Considering LT disability but $ concerns.   If anxious trouble falling asleep.   Works from home.   D 58 yo.   Plan: No med changes except added modafinil   11/26/2020 appointment with the following noted: Added modafinil  and didn't notice much from it. Sleep average about 7 hours.  Would prefer 10 hours and may nap here and there in week of chemo even on chemo.  Had to get it at [facility] with [facility]. No dx of OSA. Last 2 weeks a little low emotionally and mentally after up for a couple of weeks with company and it wore her out. Sleepiness is normally worse than tiredness. Only situational mood effects. Plan: No benefit 200 mg modafinil  nor SE therefore Increase Modafinil  to 300 mg daily for excessive sleepiness  04/21/2021 appointment with the following noted: Still dealing with chemotx for colon cancer Thinks increase modafinil  questionable but misses it more than takes it. No SE. Things are getting harder rough month with more depression and anxiety related to cancer and daughter.  Work is hard.  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  Missing deadlines.   Needs to call social security. D 58 yo and in therapy with Medford Fischer.  So angry with patient. Has been able to stay on  branded Seroquel . Tumors on CT scan  are shrinking recently. Plan: HX RELAPSE PSYCHOSIS WITH GENERIC QUETIAPINE , Continue branded Seroquel  800 mg HS and lamotrigine  150 mg daily and Luvox  300 mg daily Vraylar  1.5 mg daily for 2 weeks, Then if tolerated increase to 3 mg daily. If mood improves then reduce Seroquel  to 1 and 1/2 tablets.  06/06/2021 appt noted: I like it.  More clear headed and better energy.  Missed a few bc out of samples. Reduced Seroquel  600 mg HS Less depressed.  Especially right after starting and others noticed the  benefit. Plan : Continue lamotrigine  150 mg daily and Luvox  300 mg daily Vraylar  3 mg daily. Reduce Seroquel  to 400 mg nightly for 2 weeks,  Then reduce Seroquel  to 200 mg nightly for 2 weeks,  Then reduce to 100 mg for 2 weeks, Then reduce to 50 mg 2 weeks then stop if you can still sleep ok  08/07/2021 appointment with the following noted: Continues Vraylar  3 mg and Luvox  300 mg daily.  Reduced Seroquel  to 200 mg nightly and takes Ambien  5 mg nightly Went slowly down with Seroquel . Doing pretty good.  Some night hard to go to sleep but usally ok.   Still please with Vraylar  less sedated. No mood swings.  Some struggle with depression with reduced motivation and enjoyment.  Sometimes bored and would rather go to bed.  Don't want to read or watch TV.   Can follow a show but doesn't find it intersting.  CT scan show tumors shrinking. Plan: Continue lamotrigine  150 mg daily and Luvox  300 mg daily Reduce Vraylar  3 mg every other day to see If depression energy and interest and enjoyment is better Reduce Seroquel  to 150 for 2 weeks Then reduce to 100 mg for 2 weeks, Then reduce to 50 mg 2 weeks then stop if you can still sleep ok Wants to have access to Ambien  Plan: No benefit 200 mg modafinil  nor SE therefore OK continue trial  Modafinil  to 300 mg daily for excessive sleepiness  10/30/2021 appointment with the following noted: Reduced quetiapine  to 100 mg and trouble sleeping so back to 150 mg HS Fine with reduced Vraylar  to 3 mg QOD Increase modafinil  to 300 mg and is more alert and more active. No SE Overall mood is ok but still likes to resto often but not sleeping as much.  Not sure if it is chemo related.  Will rest after activity. At night sleeps 10 hours. Still hard to enjoy things and part of reason will lay down bc is bored.   Changed TV cable and not watching much now.  Can't concentrate to read well.  Enjoys dog.  Enjoying some projects at home.  Patient denies difficulty with  sleep maintenance.   Denies appetite disturbance.  Patient reports that motivation have been good.  Patient denies any suicidal ideation.  Still too monotone. This is last week at work.  Not doing well at work DT concentration.  Will apply for disability. Ativan  only every few weeks. Plan: Continue lamotrigine  150 mg daily  Continue Vraylar  3 mg every other day  Try again to Reduce Seroquel  to 100 mg for 2 weeks, Then reduce to 50 mg 2 weeks then stop if you can still sleep ok Wants to have access to Ambien  Reduce Luvox  to 2 and 1/2 tablets Start Wellbutrin  in the AM 1 for 1 week then 2 or 300 mg AM  12/22/21 appt noted: Reduced quetiapine  to 50 and was OK but  when stopped couldn't sleep.  Less daytime drowsy with less.   Reduced fluvoxamine  to 250 and started Wellbutrin  XL 300 mg AM. More even keel level and less down.  More content.  More steady.  Less teary and emotional. No SE problems with change. ? Modafinil  in hopes of better concentration and clarity but ? Effect. Has some trouble with conc but it varies. No increase anxiety or jitteriness. Plan: Continue lamotrigine  150 mg daily  Continue Vraylar  3 mg every other day  Try again to Reduce Seroquel  to 25-50 mg HS Wants to have access to Ambien  Continue Luvox  to 2 and 1/2 tablets Increase Wellbutrin  to 450 mg AM residual depression anhedonia  03/11/2022 appointment noted Increase Wellbutrin  to 450 mg AM residual depression anhedonia is partially better.  Not as heavy and cloudy.  Energy a little better until last 2 chemos wiped her out more with nausea for 6 days.  Chemo every 3 weeks. Enjoys her dog and helps her get up.   No SE with Wellbutrin .   Minimal anxiety usually.  Except a couple of days per week.  Anxiety over bills.   No sig obsessions.   Problems with daughter are stressful.  D drinks too much and sleeps all day and has social problems and irresponsibility. D Not working.  She is 58 yo and very needy and childlike  emotionally. Reduced Seroquel  to 25 mg HS and willing to try to stop.  Avg 10 hour and some naps. But no longer hangover without much Seroquel . Has taken more Ambien . Plan: HX RELAPSE PSYCHOSIS WITH GENERIC QUETIAPINE , Continue lamotrigine  150 mg daily  Continue Vraylar  3 mg every other day  Try again to Reduce Seroquel  if possible to 0 or  25 mg HS Wants to have access to Ambien  Continue Luvox  to 2 and 1/2 tablets Continue  Wellbutrin  to 450 mg AM residual depression anhedonia OK continue Modafinil  to 300 mg daily for excessive sleepiness and cancer related fatigue  10/15/22 appt noted: Meds as above and off Seroquel .  Rare Ambien .    No sig SE  Lately sleeping ok.  Mouth guard helped clenching of mouth.  Occ night of barely sleeping but better with mouth guard. Mood is ok . Anxiety high DT stressors.  Talking with friends family helps with stress.   Primary stressors CA and D 58 yo.   Disc sleep.   No sig obsessions.  No panic she can't handle.   Plan: Asks about trying to reduce some of the meds.  Disc options of fluvoxamine  vs lamotrigine . Reduce lamotrigine  to 1 of the 100 mg tablets for 1 month then reduce to 1/2 of the 100 mg for 2 weeks and if you feel ok stop it. Continue Vraylar  3 mg every other day  Off Seroquel  Wants to have access to Ambien  Continue Luvox  to 2 and 1/2 tablets Continue  Wellbutrin  to 450 mg AM residual depression anhedonia OK continue Modafinil  to 300 mg daily for excessive sleepiness and cancer related fatigue  01/19/23 appt noted: Couldn't tell a difference off the lamotrigine . Meds: fluvox 250, lorazepam  0.5 rare, modafinil  300, Vraylar  3 mg every other day, Wellbutrin  XL 450 AM, off lamotrigine . No Ambien  Pretty good, stable, even keel.  Mild dep sx chronically.   No sig panic or anxity. Sleep variable.  Trouble staying asleep.  Naps an hour or more twice daily. 5-6 hours sleep at night.  Still feels drowsy in the day and even driving.   No SE Plan:  Continue  Vraylar  3 mg every other day  Wants to have access to Ambien  but doesn't keep her asleep.  Continue Luvox  to 2 and 1/2 tablets Continue  Wellbutrin  to 450 mg AM residual depression anhedonia OK continue Modafinil  to 300 mg daily for excessive sleepiness and cancer related fatigue Disc EFA, trial trazodone  50-100 mg HS  05/20/23 appt noted: Meds as above: trazodone  50 HS, others as noted. Still some nights of not sleeping even with trazodone  every 2-3 weeks. Overall pretty good but still some dep and anxiety over the cancer and life.  Cancer treatment effectiveness is going well. Needs to do better to find things that engage her mind and the dep is holding her back in that area. D is still a big stress and is dependent onher.  Plan: DC Wellbutrin  and trial Auvelity  1 Am and then BID for residual depression anhedonia  07/22/23 appt noted: Meds:  fluvox 250, lorazepam  0.5 rare, modafinil  300, Vraylar  3 mg every other day, Wellbutrin  XL 450 AM, off lamotrigine . No Ambien . Trazodone  100 mg HS Never took Auvelity  bc cost  $1000.   I feel a little better lately, lighter.  Still a lot of anxiety about things.  Dep a little easier.  Not normal interest but better.  Is willing to try Auvelity  if cost can be avoided.  Hard to read DT inattention.  Some social activity.   Physically feels beaten down and tired.  Stumble a lot. Poor appetite and can't eat much.   Takes awhile to fall asleep.  11-4 then awakens and back to sleep until 12. No other sig changes. Extended chemo sessions bc good CT scans.  Pending scan from today.  No difference in SE with them spread out.  Plan: DC Wellbutrin  and trial Auvelity  1 Am and then BID for residual depression anhedonia.  If not helpful switch back.  If helpful will need to use generic products.    09/14/23 appt noted: Meds:  fluvox 250, lorazepam  0.5 rare, modafinil  300, Vraylar  3 mg every other day, Auvelity  BID, off lamotrigine . No Ambien . Trazodone  100  mg HS Will run out today.  Auvelity  PA approved but cost might be a barrier.  Elevated mood with Auvelity  and more calm off Wellbtrin too.   No SE.   Other meds the same.  Sleep pretty good with trazodone .  Satisfied.   Plan no changes  12/06/23 appt noted: Meds:  fluvox 250, lorazepam  0.5 rare, modafinil  300, Vraylar  3 mg every other day, Auvelity  BID, off lamotrigine . No Ambien . Trazodone  100 mg HS Pretty good.  Good mood almost all the time.  More productive and active. Given olanzapine  for nausea. Hasn't started yet.  Doesn't eat very much bc N but only lost 5 # lately.   D seeking pregnancy.  Some issues with that.    Past Psychiatric Medication Trials:  HX RELAPSE PSYCHOSIS WITH GENERIC QUETIAPINE ,  Seroquel  800, Risperdal 2, inVega 3, Abilify 15, Vraylar  3 Citalopram, fluoxetine 80, sertraline to 50, fluvoxamine  300 mg daily Wellbutrin  Auvleity better than Wellbutrin  lamotrigine  200,  Modafinil  300 Ambien . No Lunesta,  Trazodone  helped Has been under the care of the psychiatrist since March 2000  D seeing Medford Fischer  Review of Systems:  Review of Systems  Constitutional:  Positive for fatigue.  HENT:  Negative for rhinorrhea.   Eyes:  Negative for discharge.  Cardiovascular:  Negative for palpitations.  Gastrointestinal:  Positive for constipation and nausea. Negative for abdominal distention.  Neurological:  Positive for weakness. Negative  for tremors.       No history of SZ  Psychiatric/Behavioral:  Positive for decreased concentration. Negative for dysphoric mood. The patient is nervous/anxious.     Medications: I have reviewed the patient's current medications.  Current Outpatient Medications  Medication Sig Dispense Refill   acetaminophen  (TYLENOL ) 325 MG tablet Take 2 tablets (650 mg total) by mouth every 6 (six) hours as needed for mild pain.     albuterol  (VENTOLIN  HFA) 108 (90 Base) MCG/ACT inhaler Inhale 2 puffs into the lungs every 4 (four) hours as  needed for wheezing or shortness of breath. 1 each 0   atorvastatin  (LIPITOR) 10 MG tablet Take by mouth.     cariprazine  (VRAYLAR ) 3 MG capsule Take 1 capsule every other day 30 capsule 0   CONTOUR NEXT TEST test strip 3 (three) times daily.     Dextromethorphan-buPROPion  ER (AUVELITY ) 45-105 MG TBCR Take 1 tablet twice daily 60 tablet 4   Dulaglutide  (TRULICITY ) 3 MG/0.5ML SOAJ Inject 3 mg into the skin once a week. 6 mL 1   fluconazole  (DIFLUCAN ) 150 MG tablet By mouth today and repeat in 5-7 days for vaginal yeast infection. 2 tablet 0   fluticasone  (FLONASE ) 50 MCG/ACT nasal spray Place 1 spray into both nostrils daily as needed for rhinitis. 17 mL 0   fluvoxaMINE  (LUVOX ) 100 MG tablet Take 3 tablets (300 mg total) by mouth at bedtime. 270 tablet 0   hyoscyamine  (LEVSIN  SL) 0.125 MG SL tablet Place 1 tablet (0.125 mg total) under the tongue every 6 (six) hours as needed. 45 tablet 1   insulin  aspart (NOVOLOG  FLEXPEN) 100 UNIT/ML FlexPen Inject 8 Units into the skin 3 (three) times daily with meals. 15 mL 11   insulin  glargine (LANTUS ) 100 UNIT/ML Solostar Pen Inject 26 Units into the skin daily. 15 mL 11   levothyroxine  (SYNTHROID ) 75 MCG tablet Take 1 tablet (75 mcg total) by mouth daily. 30 tablet 0   loratadine (CLARITIN) 10 MG tablet Take 10 mg by mouth daily.     LORazepam  (ATIVAN ) 0.5 MG tablet Take 1 tablet (0.5 mg total) by mouth every 8 (eight) hours as needed for anxiety. 30 tablet 3   magic mouthwash SOLN Take 10 mLs by mouth 3 (three) times daily as needed for mouth pain.     modafinil  (PROVIGIL ) 200 MG tablet TAKE 1 & 1/2 (ONE & ONE-HALF) TABLETS BY MOUTH ONCE DAILY 45 tablet 1   Multiple Vitamin (MULTI-VITAMIN) tablet Take 1 tablet by mouth daily.     NOVOFINE PEN NEEDLE 32G X 6 MM MISC SMARTSIG:Syringe(s) SUB-Q     OLANZapine  (ZYPREXA ) 5 MG tablet Take 1 tablet (5 mg total) by mouth at bedtime. 7 tablet 1   omeprazole (PRILOSEC) 40 MG capsule Take 40 mg by mouth daily.      ondansetron  (ZOFRAN ) 4 MG tablet Take 1 tablet (4 mg total) by mouth every 4 (four) hours as needed for nausea. 90 tablet 3   ondansetron  (ZOFRAN -ODT) 4 MG disintegrating tablet Take 1 tablet (4 mg total) by mouth every 4 (four) hours as needed for nausea or vomiting. Dissolve on tongue 90 tablet 3   promethazine  (PHENERGAN ) 25 MG tablet Take 1 tablet (25 mg total) by mouth every 6 (six) hours as needed for nausea. 30 tablet 3   senna-docusate (SENOKOT-S) 8.6-50 MG tablet Take 2 tablets by mouth at bedtime.     Spacer/Aero-Holding Chambers (COMPACT SPACE CHAMBER) DEVI Use with the albuterol  inhaler 1 each 0  sucralfate (CARAFATE) 1 g tablet Take 1 g by mouth 4 (four) times daily.     traZODone  (DESYREL ) 50 MG tablet 1-2 tablets at night as needed for sleep 180 tablet 1   No current facility-administered medications for this visit.    Medication Side Effects: None  Allergies:  Allergies  Allergen Reactions   Leucovorin  Hives, Rash and Other (See Comments)    Redness on face and neck.  Confirmed that it was the leucovorin  causing this on 11/19/21.  We are deleting this from future plan.   Clindamycin/Lincomycin Rash   Irinotecan  Rash   Penicillins Rash    Reaction: 10 years    Past Medical History:  Diagnosis Date   Anemia    Iron De   Anxiety    Blood clot in vein    Bowel obstruction (HCC)    Colon cancer (HCC) 2018   treated with surgery and chemotherapy   Depression    Diabetes mellitus without complication (HCC)    Type II   DVT (deep venous thrombosis) (HCC) 2019   behind right knee- while she wasa on chemo   GERD (gastroesophageal reflux disease)    History of blood transfusion    History of chemotherapy    History of kidney stones 2012   passed   Hypothyroidism    Obesity     Family History  Problem Relation Age of Onset   Irritable bowel syndrome Mother    Colon polyps Father    Breast cancer Maternal Grandmother    Diabetes Maternal Grandmother    Diabetes  Maternal Grandfather    Breast cancer Paternal Grandmother    Colon polyps Maternal Uncle    Stomach cancer Neg Hx    Pancreatic cancer Neg Hx    Esophageal cancer Neg Hx    Colon cancer Neg Hx     Social History   Socioeconomic History   Marital status: Widowed    Spouse name: Not on file   Number of children: Not on file   Years of education: Not on file   Highest education level: Not on file  Occupational History   Occupation: care coordinator   Tobacco Use   Smoking status: Never   Smokeless tobacco: Never  Vaping Use   Vaping status: Never Used  Substance and Sexual Activity   Alcohol use: Yes    Comment: rare   Drug use: Never   Sexual activity: Not Currently  Other Topics Concern   Not on file  Social History Narrative   Not on file   Social Drivers of Health   Financial Resource Strain: Not on file  Food Insecurity: Not on file  Transportation Needs: Not on file  Physical Activity: Not on file  Stress: Not on file  Social Connections: Not on file  Intimate Partner Violence: Not on file    Past Medical History, Surgical history, Social history, and Family history were reviewed and updated as appropriate.   Please see review of systems for further details on the patient's review from today.   Objective:   Physical Exam:  LMP 02/22/2019 (Approximate)   Physical Exam Constitutional:      General: She is not in acute distress. Musculoskeletal:        General: No deformity.  Neurological:     Mental Status: She is alert and oriented to person, place, and time.     Coordination: Coordination normal.  Psychiatric:        Attention and Perception: Attention and  perception normal. She does not perceive auditory or visual hallucinations.        Mood and Affect: Mood is anxious. Mood is not depressed. Affect is not labile, blunt or inappropriate.        Speech: Speech normal. Speech is not rapid and pressured.        Behavior: Behavior normal.         Thought Content: Thought content normal. Thought content is not paranoid or delusional. Thought content does not include homicidal or suicidal ideation.        Cognition and Memory: Cognition and memory normal.        Judgment: Judgment normal.     Comments: Insight intact Less anhedonia and depression with switch to Auvelity      Lab Review:     Component Value Date/Time   NA 141 11/03/2023 1055   NA 138 03/15/2023 0000   K 4.2 11/03/2023 1055   CL 103 11/03/2023 1055   CO2 26 11/03/2023 1055   GLUCOSE 126 (H) 11/03/2023 1055   BUN 17 11/03/2023 1055   BUN 16 03/15/2023 0000   CREATININE 1.46 (H) 11/03/2023 1055   CALCIUM  9.8 11/03/2023 1055   PROT 6.5 11/03/2023 1055   ALBUMIN 3.6 11/03/2023 1055   AST 31 11/03/2023 1055   ALT 18 11/03/2023 1055   ALKPHOS 227 (H) 11/03/2023 1055   BILITOT 0.5 11/03/2023 1055   GFRNONAA 42 (L) 11/03/2023 1055   GFRNONAA 52 10/23/2022 0000       Component Value Date/Time   WBC 3.2 (L) 11/05/2023 1520   WBC 4.8 06/07/2020 0525   RBC 3.93 11/05/2023 1520   HGB 12.6 11/05/2023 1520   HCT 38.7 11/05/2023 1520   PLT 32 (L) 11/05/2023 1520   MCV 98.5 11/05/2023 1520   MCV 101 (A) 01/04/2023 0000   MCH 32.1 11/05/2023 1520   MCHC 32.6 11/05/2023 1520   RDW 15.7 (H) 11/05/2023 1520   LYMPHSABS 0.7 11/05/2023 1520   MONOABS 0.1 11/05/2023 1520   EOSABS 0.0 11/05/2023 1520   BASOSABS 0.0 11/05/2023 1520    No results found for: POCLITH, LITHIUM   No results found for: PHENYTOIN, PHENOBARB, VALPROATE, CBMZ   .res Assessment: Plan:    No diagnosis found.   30 min Overall mood and anxiety are better managed with Auvelity .  I feel really good.   Dealing well with marked stressors with met CA dx .  Disc stopping Vraylar  3 mg every other day .  She feels ok about stopping it.  If dep worsens resume it.   Wants to have access to Ambien  but doesn't keep her asleep.  Continue Luvox  to 2 and 1/2 tablets  Better mood and  anxiety with DC Wellbutrin  and trial Auvelity  1 Am and then BID for residual depression anhedonia.     If helpful will possibly  need to use generic products.  This was discussed.  But PA approved.  OK continue Modafinil  to 300 mg daily for excessive sleepiness and cancer related fatigue  trazodone  50-100 mg HS Option Lunesta alternative  Discussed potential metabolic side effects associated with atypical antipsychotics, as well as potential risk for movement side effects. Advised pt to contact office if movement side effects occur.   Disc polypharmacy and will try to eliminate some meds if depression improves.  Option reduce fluvox at follow up.   FU 8 weeks  Lorene Macintosh, MD, DFAPA   Please see After Visit Summary for patient specific instructions.  Future Appointments  Date Time Provider Department Center  12/15/2023  9:45 AM [facility] PORT FLUSH LAB [facility] None  12/15/2023 10:15 AM Lewis, Dequincy A, MD [facility] None  12/15/2023 10:30 AM [facility] BED 1 [facility] None  12/17/2023  3:30 PM [facility] INFUSION CHAIR 4 [facility] None  07/20/2024  1:00 PM [facility] MR 3 [facility] [address] [facility]  07/24/2024  7:00 AM [facility] BO[facility] CONFERENCE [facility] None  07/25/2024 12:00 PM Vaslow, Zachary K, MD [facility] None    No orders of the defined types were placed in this encounter.      -------------------------------

## 2023-12-14 NOTE — Progress Notes (Unsigned)
 [facility]  [address] [place],  [place]  [address] [phone]  Clinic Day:  12/15/2023/2025  Referring physician: Ezzard Valaria LABOR, MD  HISTORY OF PRESENT ILLNESS:  The patient is a 58 y.o. female with metastatic colon cancer, which includes spread of disease to her abdominal cavity, lungs, brain, liver, and right adrenal gland.  She comes in today to be evaluated before heading into her 27th cycle of palliative chemotherapy, which consists of FOLFOX/Avastin .  Since her last visit, the patient has been doing fairly well.  She does complain of steady fatigue, likely related to her numerous years of chemotherapy.  She has also had intermittent constipation.  However, she denies having any abdominal pain at this time or other new GI symptoms/findings which concern her for overt signs of disease progression.    With respect to her colon cancer history, she is status post a right hemicolectomy in early October 2018, followed by 12 cycles of adjuvant FOLFOX chemotherapy, which were completed in April 2019.  In December 2021, she underwent a left cerebellar metastasectomy, whose pathology was consistent with metastatic colon cancer.  Tumor testing did come back MMR normal. CT scans revealed evidence of her cancer being in multiple locations, for which she took 40 cycles of FOLFIRI/Avastin .  As follow-up scans revealed liver metastasis, she was placed back on FOLFOX/Avastin , for which she continues to take.  PHYSICAL EXAM:  Blood pressure 112/80, pulse 95, temperature 98.3 F (36.8 C), temperature source Oral, resp. rate 16, height 5' 8 (1.727 m), weight 146 lb 3.2 oz (66.3 kg), last menstrual period 02/22/2019, SpO2 95%. Body mass index is 22.23 kg/m.  Performance status (ECOG): 1 - Symptomatic but completely ambulatory  Physical Exam Vitals and nursing note reviewed.  Constitutional:      General: She is not in acute distress.    Appearance: Normal  appearance.  HENT:     Head: Normocephalic and atraumatic.     Mouth/Throat:     Mouth: Mucous membranes are moist.     Pharynx: Oropharynx is clear. No oropharyngeal exudate or posterior oropharyngeal erythema.  Eyes:     General: No scleral icterus.    Extraocular Movements: Extraocular movements intact.     Conjunctiva/sclera: Conjunctivae normal.     Pupils: Pupils are equal, round, and reactive to light.  Cardiovascular:     Rate and Rhythm: Normal rate and regular rhythm.     Heart sounds: Normal heart sounds. No murmur heard.    No friction rub. No gallop.  Pulmonary:     Effort: Pulmonary effort is normal.     Breath sounds: Normal breath sounds. No wheezing, rhonchi or rales.  Abdominal:     General: There is no distension.     Palpations: Abdomen is soft. There is no hepatomegaly, splenomegaly or mass.     Tenderness: There is no abdominal tenderness.  Musculoskeletal:        General: Normal range of motion.     Cervical back: Normal range of motion and neck supple. No tenderness.     Right lower leg: No edema.     Left lower leg: No edema.  Lymphadenopathy:     Cervical: No cervical adenopathy.     Upper Body:     Right upper body: No supraclavicular or axillary adenopathy.     Left upper body: No supraclavicular or axillary adenopathy.     Lower Body: No right inguinal adenopathy. No left inguinal adenopathy.  Skin:  General: Skin is warm and dry.     Coloration: Skin is not jaundiced.     Findings: No rash.  Neurological:     Mental Status: She is alert and oriented to person, place, and time.     Cranial Nerves: No cranial nerve deficit.  Psychiatric:        Mood and Affect: Mood normal.        Behavior: Behavior normal.        Thought Content: Thought content normal.    LABS:      Latest Ref Rng & Units 12/15/2023    9:46 AM 11/05/2023    3:20 PM 11/03/2023   10:55 AM  CBC  WBC 4.0 - 10.5 K/uL 4.9  3.2  3.7   Hemoglobin 12.0 - 15.0 g/dL 88.0  87.3   87.3   Hematocrit 36.0 - 46.0 % 37.3  38.7  40.1   Platelets 150 - 400 K/uL 50  32  46       Latest Ref Rng & Units 12/15/2023    9:46 AM 11/03/2023   10:55 AM 10/11/2023   10:27 AM  CMP  Glucose 70 - 99 mg/dL 771  873  746   BUN 6 - 20 mg/dL 13  17  17    Creatinine 0.44 - 1.00 mg/dL 8.67  8.53  8.74   Sodium 135 - 145 mmol/L 140  141  141   Potassium 3.5 - 5.1 mmol/L 4.5  4.2  4.7   Chloride 98 - 111 mmol/L 100  103  102   CO2 22 - 32 mmol/L 28  26  29    Calcium  8.9 - 10.3 mg/dL 9.5  9.8  9.8   Total Protein 6.5 - 8.1 g/dL 6.5  6.5  5.9   Total Bilirubin 0.0 - 1.2 mg/dL 0.4  0.5  0.3   Alkaline Phos 38 - 126 U/L 297  227  188   AST 15 - 41 U/L 38  31  26   ALT 0 - 44 U/L 34  18  16    ASSESSMENT & PLAN:  Assessment/Plan:  A 58 y.o. female with metastatic colon cancer.  She will proceed her 27th cycle of FOLFOX/Avastin  today. Of note, her [employer] for NGS test results are not back yet.  I will notify her of the results as soon as they become available.  Otherwise, I will see her back in 3 weeks before she heads into her 28th cycle of FOLFOX/Avastin .  The patient understands all the plans discussed today and is in agreement with them.  Aubert Choyce DELENA Kerns, MD

## 2023-12-15 ENCOUNTER — Encounter: Payer: Self-pay | Admitting: Oncology

## 2023-12-15 ENCOUNTER — Inpatient Hospital Stay: Attending: Oncology | Admitting: Oncology

## 2023-12-15 ENCOUNTER — Ambulatory Visit: Admitting: Psychiatry

## 2023-12-15 ENCOUNTER — Inpatient Hospital Stay

## 2023-12-15 ENCOUNTER — Other Ambulatory Visit: Payer: Self-pay | Admitting: Oncology

## 2023-12-15 ENCOUNTER — Telehealth: Payer: Self-pay | Admitting: Oncology

## 2023-12-15 DIAGNOSIS — C7800 Secondary malignant neoplasm of unspecified lung: Secondary | ICD-10-CM | POA: Diagnosis not present

## 2023-12-15 DIAGNOSIS — C189 Malignant neoplasm of colon, unspecified: Secondary | ICD-10-CM

## 2023-12-15 DIAGNOSIS — Z5111 Encounter for antineoplastic chemotherapy: Secondary | ICD-10-CM | POA: Insufficient documentation

## 2023-12-15 DIAGNOSIS — C182 Malignant neoplasm of ascending colon: Secondary | ICD-10-CM

## 2023-12-15 DIAGNOSIS — C7931 Secondary malignant neoplasm of brain: Secondary | ICD-10-CM | POA: Diagnosis not present

## 2023-12-15 DIAGNOSIS — C7971 Secondary malignant neoplasm of right adrenal gland: Secondary | ICD-10-CM | POA: Diagnosis not present

## 2023-12-15 DIAGNOSIS — C786 Secondary malignant neoplasm of retroperitoneum and peritoneum: Secondary | ICD-10-CM | POA: Diagnosis not present

## 2023-12-15 DIAGNOSIS — C7802 Secondary malignant neoplasm of left lung: Secondary | ICD-10-CM | POA: Insufficient documentation

## 2023-12-15 DIAGNOSIS — C7801 Secondary malignant neoplasm of right lung: Secondary | ICD-10-CM | POA: Insufficient documentation

## 2023-12-15 DIAGNOSIS — C787 Secondary malignant neoplasm of liver and intrahepatic bile duct: Secondary | ICD-10-CM | POA: Insufficient documentation

## 2023-12-15 LAB — CMP (CANCER CENTER ONLY)
ALT: 34 U/L (ref 0–44)
AST: 38 U/L (ref 15–41)
Albumin: 3.4 g/dL — ABNORMAL LOW (ref 3.5–5.0)
Alkaline Phosphatase: 297 U/L — ABNORMAL HIGH (ref 38–126)
Anion gap: 11 (ref 5–15)
BUN: 13 mg/dL (ref 6–20)
CO2: 28 mmol/L (ref 22–32)
Calcium: 9.5 mg/dL (ref 8.9–10.3)
Chloride: 100 mmol/L (ref 98–111)
Creatinine: 1.32 mg/dL — ABNORMAL HIGH (ref 0.44–1.00)
GFR, Estimated: 47 mL/min — ABNORMAL LOW (ref >60)
Glucose, Bld: 228 mg/dL — ABNORMAL HIGH (ref 70–99)
Potassium: 4.5 mmol/L (ref 3.5–5.1)
Sodium: 140 mmol/L (ref 135–145)
Total Bilirubin: 0.4 mg/dL (ref 0.0–1.2)
Total Protein: 6.5 g/dL (ref 6.5–8.1)

## 2023-12-15 LAB — CBC WITH DIFFERENTIAL (CANCER CENTER ONLY)
Abs Immature Granulocytes: 0.02 K/uL (ref 0.00–0.07)
Basophils Absolute: 0 K/uL (ref 0.0–0.1)
Basophils Relative: 0 %
Eosinophils Absolute: 0.1 K/uL (ref 0.0–0.5)
Eosinophils Relative: 1 %
HCT: 37.3 % (ref 36.0–46.0)
Hemoglobin: 11.9 g/dL — ABNORMAL LOW (ref 12.0–15.0)
Immature Granulocytes: 0 %
Lymphocytes Relative: 14 %
Lymphs Abs: 0.7 K/uL (ref 0.7–4.0)
MCH: 32.5 pg (ref 26.0–34.0)
MCHC: 31.9 g/dL (ref 30.0–36.0)
MCV: 101.9 fL — ABNORMAL HIGH (ref 80.0–100.0)
Monocytes Absolute: 0.5 K/uL (ref 0.1–1.0)
Monocytes Relative: 11 %
Neutro Abs: 3.6 K/uL (ref 1.7–7.7)
Neutrophils Relative %: 74 %
Platelet Count: 50 K/uL — ABNORMAL LOW (ref 150–400)
RBC: 3.66 MIL/uL — ABNORMAL LOW (ref 3.87–5.11)
RDW: 16.4 % — ABNORMAL HIGH (ref 11.5–15.5)
WBC Count: 4.9 K/uL (ref 4.0–10.5)
nRBC: 0 % (ref 0.0–0.2)

## 2023-12-15 LAB — TOTAL PROTEIN, URINE DIPSTICK: Protein, ur: NEGATIVE mg/dL

## 2023-12-15 LAB — CEA (ACCESS): CEA (CHCC): 66.98 ng/mL — ABNORMAL HIGH (ref 0.00–5.00)

## 2023-12-15 MED ORDER — SODIUM CHLORIDE 0.9 % IV SOLN
5.0000 mg/kg | Freq: Once | INTRAVENOUS | Status: AC
  Administered 2023-12-15: 350 mg via INTRAVENOUS
  Filled 2023-12-15: qty 14

## 2023-12-15 MED ORDER — SODIUM CHLORIDE 0.9 % IV SOLN
2400.0000 mg/m2 | INTRAVENOUS | Status: DC
  Administered 2023-12-15: 4350 mg via INTRAVENOUS
  Filled 2023-12-15: qty 87

## 2023-12-15 MED ORDER — DIPHENHYDRAMINE HCL 50 MG/ML IJ SOLN
25.0000 mg | Freq: Once | INTRAMUSCULAR | Status: AC
  Administered 2023-12-15: 25 mg via INTRAVENOUS
  Filled 2023-12-15: qty 1

## 2023-12-15 MED ORDER — FAMOTIDINE IN NACL 20-0.9 MG/50ML-% IV SOLN
20.0000 mg | Freq: Once | INTRAVENOUS | Status: AC
  Administered 2023-12-15: 20 mg via INTRAVENOUS
  Filled 2023-12-15: qty 50

## 2023-12-15 MED ORDER — OXALIPLATIN CHEMO INJECTION 50 MG/10ML
63.7500 mg/m2 | Freq: Once | INTRAVENOUS | Status: AC
  Administered 2023-12-15: 115 mg via INTRAVENOUS
  Filled 2023-12-15: qty 20

## 2023-12-15 MED ORDER — PALONOSETRON HCL INJECTION 0.25 MG/5ML
0.2500 mg | Freq: Once | INTRAVENOUS | Status: AC
  Administered 2023-12-15: 0.25 mg via INTRAVENOUS
  Filled 2023-12-15: qty 5

## 2023-12-15 MED ORDER — DEXAMETHASONE SODIUM PHOSPHATE 10 MG/ML IJ SOLN
10.0000 mg | Freq: Once | INTRAMUSCULAR | Status: AC
  Administered 2023-12-15: 10 mg via INTRAVENOUS
  Filled 2023-12-15: qty 1

## 2023-12-15 MED ORDER — DEXTROSE 5 % IV SOLN: Freq: Once | INTRAVENOUS | Status: AC

## 2023-12-15 MED ORDER — FLUOROURACIL CHEMO INJECTION 2.5 GM/50ML
400.0000 mg/m2 | Freq: Once | INTRAVENOUS | Status: AC
  Administered 2023-12-15: 700 mg via INTRAVENOUS
  Filled 2023-12-15: qty 14

## 2023-12-15 MED ORDER — SODIUM CHLORIDE 0.9 % IV SOLN
150.0000 mg | Freq: Once | INTRAVENOUS | Status: AC
  Administered 2023-12-15: 150 mg via INTRAVENOUS
  Filled 2023-12-15: qty 150

## 2023-12-15 NOTE — Progress Notes (Signed)
 1047 OKAY TO TREAT AND PROCEED PER DR. LEWIS TODAY FOR PLATELETS AT 50.

## 2023-12-15 NOTE — Patient Instructions (Signed)
 Bevacizumab  Injection What is this medication? BEVACIZUMAB  (be va SIZ yoo mab) treats some types of cancer. It works by blocking a protein that causes cancer cells to grow and multiply. This helps to slow or stop the spread of cancer cells. It is a monoclonal antibody. This medicine may be used for other purposes; ask your health care provider or pharmacist if you have questions. COMMON BRAND NAME(S): Alymsys , Avastin , MVASI , Vegzalma, Zirabev  What should I tell my care team before I take this medication? They need to know if you have any of these conditions: Blood clots Coughing up blood Having or recent surgery Heart failure High blood pressure History of a connection between 2 or more body parts that do not usually connect (fistula) History of a tear in your stomach or intestines Protein in your urine An unusual or allergic reaction to bevacizumab , other medications, foods, dyes, or preservatives Pregnant or trying to get pregnant Breast-feeding How should I use this medication? This medication is injected into a vein. It is given by your care team in a hospital or clinic setting. Talk to your care team the use of this medication in children. Special care may be needed. Overdosage: If you think you have taken too much of this medicine contact a poison control center or emergency room at once. NOTE: This medicine is only for you. Do not share this medicine with others. What if I miss a dose? Keep appointments for follow-up doses. It is important not to miss your dose. Call your care team if you are unable to keep an appointment. What may interact with this medication? Interactions are not expected. This list may not describe all possible interactions. Give your health care provider a list of all the medicines, herbs, non-prescription drugs, or dietary supplements you use. Also tell them if you smoke, drink alcohol, or use illegal drugs. Some items may interact with your medicine. What  should I watch for while using this medication? Your condition will be monitored carefully while you are receiving this medication. You may need blood work while taking this medication. This medication may make you feel generally unwell. This is not uncommon as chemotherapy can affect healthy cells as well as cancer cells. Report any side effects. Continue your course of treatment even though you feel ill unless your care team tells you to stop. This medication may increase your risk to bruise or bleed. Call your care team if you notice any unusual bleeding. Before having surgery, talk to your care team to make sure it is ok. This medication can increase the risk of poor healing of your surgical site or wound. You will need to stop this medication for 28 days before surgery. After surgery, wait at least 28 days before restarting this medication. Make sure the surgical site or wound is healed enough before restarting this medication. Talk to your care team if questions. Talk to your care team if you may be pregnant. Serious birth defects can occur if you take this medication during pregnancy and for 6 months after the last dose. Contraception is recommended while taking this medication and for 6 months after the last dose. Your care team can help you find the option that works for you. Do not breastfeed while taking this medication and for 6 months after the last dose. This medication can cause infertility. Talk to your care team if you are concerned about your fertility. What side effects may I notice from receiving this medication? Side effects that you should  report to your care team as soon as possible: Allergic reactions--skin rash, itching, hives, swelling of the face, lips, tongue, or throat Bleeding--bloody or black, tar-like stools, vomiting blood or brown material that looks like coffee grounds, red or dark brown urine, small red or purple spots on skin, unusual bruising or bleeding Blood  clot--pain, swelling, or warmth in the leg, shortness of breath, chest pain Heart attack--pain or tightness in the chest, shoulders, arms, or jaw, nausea, shortness of breath, cold or clammy skin, feeling faint or lightheaded Heart failure--shortness of breath, swelling of the ankles, feet, or hands, sudden weight gain, unusual weakness or fatigue Increase in blood pressure Infection--fever, chills, cough, sore throat, wounds that don't heal, pain or trouble when passing urine, general feeling of discomfort or being unwell Infusion reactions--chest pain, shortness of breath or trouble breathing, feeling faint or lightheaded Kidney injury--decrease in the amount of urine, swelling of the ankles, hands, or feet Stomach pain that is severe, does not go away, or gets worse Stroke--sudden numbness or weakness of the face, arm, or leg, trouble speaking, confusion, trouble walking, loss of balance or coordination, dizziness, severe headache, change in vision Sudden and severe headache, confusion, change in vision, seizures, which may be signs of posterior reversible encephalopathy syndrome (PRES) Side effects that usually do not require medical attention (report to your care team if they continue or are bothersome): Back pain Change in taste Diarrhea Dry skin Increased tears Nosebleed This list may not describe all possible side effects. Call your doctor for medical advice about side effects. You may report side effects to FDA at 1-800-FDA-1088. Where should I keep my medication? This medication is given in a hospital or clinic. It will not be stored at home. NOTE: This sheet is a summary. It may not cover all possible information. If you have questions about this medicine, talk to your doctor, pharmacist, or health care provider.  2024 Elsevier/Gold Standard (2021-10-03 00:00:00)Fluorouracil  Injection What is this medication? FLUOROURACIL  (flure oh YOOR a sil) treats some types of cancer. It works  by slowing down the growth of cancer cells. This medicine may be used for other purposes; ask your health care provider or pharmacist if you have questions. COMMON BRAND NAME(S): Adrucil  What should I tell my care team before I take this medication? They need to know if you have any of these conditions: Blood disorders Dihydropyrimidine dehydrogenase (DPD) deficiency Infection, such as chickenpox, cold sores, herpes Kidney disease Liver disease Poor nutrition Recent or ongoing radiation therapy An unusual or allergic reaction to fluorouracil , other medications, foods, dyes, or preservatives If you or your partner are pregnant or trying to get pregnant Breast-feeding How should I use this medication? This medication is injected into a vein. It is administered by your care team in a hospital or clinic setting. Talk to your care team about the use of this medication in children. Special care may be needed. Overdosage: If you think you have taken too much of this medicine contact a poison control center or emergency room at once. NOTE: This medicine is only for you. Do not share this medicine with others. What if I miss a dose? Keep appointments for follow-up doses. It is important not to miss your dose. Call your care team if you are unable to keep an appointment. What may interact with this medication? Do not take this medication with any of the following: Live virus vaccines This medication may also interact with the following: Medications that treat or prevent blood  clots, such as warfarin, enoxaparin, dalteparin This list may not describe all possible interactions. Give your health care provider a list of all the medicines, herbs, non-prescription drugs, or dietary supplements you use. Also tell them if you smoke, drink alcohol, or use illegal drugs. Some items may interact with your medicine. What should I watch for while using this medication? Your condition will be monitored  carefully while you are receiving this medication. This medication may make you feel generally unwell. This is not uncommon as chemotherapy can affect healthy cells as well as cancer cells. Report any side effects. Continue your course of treatment even though you feel ill unless your care team tells you to stop. In some cases, you may be given additional medications to help with side effects. Follow all directions for their use. This medication may increase your risk of getting an infection. Call your care team for advice if you get a fever, chills, sore throat, or other symptoms of a cold or flu. Do not treat yourself. Try to avoid being around people who are sick. This medication may increase your risk to bruise or bleed. Call your care team if you notice any unusual bleeding. Be careful brushing or flossing your teeth or using a toothpick because you may get an infection or bleed more easily. If you have any dental work done, tell your dentist you are receiving this medication. Avoid taking medications that contain aspirin, acetaminophen , ibuprofen, naproxen, or ketoprofen unless instructed by your care team. These medications may hide a fever. Do not treat diarrhea with over the counter products. Contact your care team if you have diarrhea that lasts more than 2 days or if it is severe and watery. This medication can make you more sensitive to the sun. Keep out of the sun. If you cannot avoid being in the sun, wear protective clothing and sunscreen. Do not use sun lamps, tanning beds, or tanning booths. Talk to your care team if you or your partner wish to become pregnant or think you might be pregnant. This medication can cause serious birth defects if taken during pregnancy and for 3 months after the last dose. A reliable form of contraception is recommended while taking this medication and for 3 months after the last dose. Talk to your care team about effective forms of contraception. Do not father  a child while taking this medication and for 3 months after the last dose. Use a condom while having sex during this time period. Do not breastfeed while taking this medication. This medication may cause infertility. Talk to your care team if you are concerned about your fertility. What side effects may I notice from receiving this medication? Side effects that you should report to your care team as soon as possible: Allergic reactions--skin rash, itching, hives, swelling of the face, lips, tongue, or throat Heart attack--pain or tightness in the chest, shoulders, arms, or jaw, nausea, shortness of breath, cold or clammy skin, feeling faint or lightheaded Heart failure--shortness of breath, swelling of the ankles, feet, or hands, sudden weight gain, unusual weakness or fatigue Heart rhythm changes--fast or irregular heartbeat, dizziness, feeling faint or lightheaded, chest pain, trouble breathing High ammonia level--unusual weakness or fatigue, confusion, loss of appetite, nausea, vomiting, seizures Infection--fever, chills, cough, sore throat, wounds that don't heal, pain or trouble when passing urine, general feeling of discomfort or being unwell Low red blood cell level--unusual weakness or fatigue, dizziness, headache, trouble breathing Pain, tingling, or numbness in the hands or feet,  muscle weakness, change in vision, confusion or trouble speaking, loss of balance or coordination, trouble walking, seizures Redness, swelling, and blistering of the skin over hands and feet Severe or prolonged diarrhea Unusual bruising or bleeding Side effects that usually do not require medical attention (report to your care team if they continue or are bothersome): Dry skin Headache Increased tears Nausea Pain, redness, or swelling with sores inside the mouth or throat Sensitivity to light Vomiting This list may not describe all possible side effects. Call your doctor for medical advice about side  effects. You may report side effects to FDA at 1-800-FDA-1088. Where should I keep my medication? This medication is given in a hospital or clinic. It will not be stored at home. NOTE: This sheet is a summary. It may not cover all possible information. If you have questions about this medicine, talk to your doctor, pharmacist, or health care provider.  2024 Elsevier/Gold Standard (2021-09-23 00:00:00)Oxaliplatin  Injection What is this medication? OXALIPLATIN  (ox AL i PLA tin) treats colorectal cancer. It works by slowing down the growth of cancer cells. This medicine may be used for other purposes; ask your health care provider or pharmacist if you have questions. COMMON BRAND NAME(S): Eloxatin  What should I tell my care team before I take this medication? They need to know if you have any of these conditions: Heart disease History of irregular heartbeat or rhythm Liver disease Low blood cell levels (white cells, red cells, and platelets) Lung or breathing disease, such as asthma Take medications that treat or prevent blood clots Tingling of the fingers, toes, or other nerve disorder An unusual or allergic reaction to oxaliplatin , other medications, foods, dyes, or preservatives If you or your partner are pregnant or trying to get pregnant Breast-feeding How should I use this medication? This medication is injected into a vein. It is given by your care team in a hospital or clinic setting. Talk to your care team about the use of this medication in children. Special care may be needed. Overdosage: If you think you have taken too much of this medicine contact a poison control center or emergency room at once. NOTE: This medicine is only for you. Do not share this medicine with others. What if I miss a dose? Keep appointments for follow-up doses. It is important not to miss a dose. Call your care team if you are unable to keep an appointment. What may interact with this medication? Do not  take this medication with any of the following: Cisapride Dronedarone Pimozide Thioridazine This medication may also interact with the following: Aspirin and aspirin-like medications Certain medications that treat or prevent blood clots, such as warfarin, apixaban, dabigatran, and rivaroxaban Cisplatin Cyclosporine Diuretics Medications for infection, such as acyclovir, adefovir, amphotericin B, bacitracin , cidofovir, foscarnet, ganciclovir, gentamicin, pentamidine, vancomycin  NSAIDs, medications for pain and inflammation, such as ibuprofen or naproxen Other medications that cause heart rhythm changes Pamidronate Zoledronic acid This list may not describe all possible interactions. Give your health care provider a list of all the medicines, herbs, non-prescription drugs, or dietary supplements you use. Also tell them if you smoke, drink alcohol, or use illegal drugs. Some items may interact with your medicine. What should I watch for while using this medication? Your condition will be monitored carefully while you are receiving this medication. You may need blood work while taking this medication. This medication may make you feel generally unwell. This is not uncommon as chemotherapy can affect healthy cells as well as cancer cells.  Report any side effects. Continue your course of treatment even though you feel ill unless your care team tells you to stop. This medication may increase your risk of getting an infection. Call your care team for advice if you get a fever, chills, sore throat, or other symptoms of a cold or flu. Do not treat yourself. Try to avoid being around people who are sick. Avoid taking medications that contain aspirin, acetaminophen , ibuprofen, naproxen, or ketoprofen unless instructed by your care team. These medications may hide a fever. Be careful brushing or flossing your teeth or using a toothpick because you may get an infection or bleed more easily. If you have any  dental work done, tell your dentist you are receiving this medication. This medication can make you more sensitive to cold. Do not drink cold drinks or use ice. Cover exposed skin before coming in contact with cold temperatures or cold objects. When out in cold weather wear warm clothing and cover your mouth and nose to warm the air that goes into your lungs. Tell your care team if you get sensitive to the cold. Talk to your care team if you or your partner are pregnant or think either of you might be pregnant. This medication can cause serious birth defects if taken during pregnancy and for 9 months after the last dose. A negative pregnancy test is required before starting this medication. A reliable form of contraception is recommended while taking this medication and for 9 months after the last dose. Talk to your care team about effective forms of contraception. Do not father a child while taking this medication and for 6 months after the last dose. Use a condom while having sex during this time period. Do not breastfeed while taking this medication and for 3 months after the last dose. This medication may cause infertility. Talk to your care team if you are concerned about your fertility. What side effects may I notice from receiving this medication? Side effects that you should report to your care team as soon as possible: Allergic reactions--skin rash, itching, hives, swelling of the face, lips, tongue, or throat Bleeding--bloody or black, tar-like stools, vomiting blood or brown material that looks like coffee grounds, red or dark brown urine, small red or purple spots on skin, unusual bruising or bleeding Dry cough, shortness of breath or trouble breathing Heart rhythm changes--fast or irregular heartbeat, dizziness, feeling faint or lightheaded, chest pain, trouble breathing Infection--fever, chills, cough, sore throat, wounds that don't heal, pain or trouble when passing urine, general feeling  of discomfort or being unwell Liver injury--right upper belly pain, loss of appetite, nausea, light-colored stool, dark yellow or brown urine, yellowing skin or eyes, unusual weakness or fatigue Low red blood cell level--unusual weakness or fatigue, dizziness, headache, trouble breathing Muscle injury--unusual weakness or fatigue, muscle pain, dark yellow or brown urine, decrease in amount of urine Pain, tingling, or numbness in the hands or feet Sudden and severe headache, confusion, change in vision, seizures, which may be signs of posterior reversible encephalopathy syndrome (PRES) Unusual bruising or bleeding Side effects that usually do not require medical attention (report to your care team if they continue or are bothersome): Diarrhea Nausea Pain, redness, or swelling with sores inside the mouth or throat Unusual weakness or fatigue Vomiting This list may not describe all possible side effects. Call your doctor for medical advice about side effects. You may report side effects to FDA at 1-800-FDA-1088. Where should I keep my medication? This medication  is given in a hospital or clinic. It will not be stored at home. NOTE: This sheet is a summary. It may not cover all possible information. If you have questions about this medicine, talk to your doctor, pharmacist, or health care provider.  2024 [employer]/Gold Standard (2023-04-30 00:00:00)Fluorouracil  Injection What is this medication? FLUOROURACIL  (flure oh YOOR a sil) treats some types of cancer. It works by slowing down the growth of cancer cells. This medicine may be used for other purposes; ask your health care provider or pharmacist if you have questions. COMMON BRAND NAME(S): Adrucil  What should I tell my care team before I take this medication? They need to know if you have any of these conditions: Blood disorders Dihydropyrimidine dehydrogenase (DPD) deficiency Infection, such as chickenpox, cold sores, herpes Kidney  disease Liver disease Poor nutrition Recent or ongoing radiation therapy An unusual or allergic reaction to fluorouracil , other medications, foods, dyes, or preservatives If you or your partner are pregnant or trying to get pregnant Breast-feeding How should I use this medication? This medication is injected into a vein. It is administered by your care team in a hospital or clinic setting. Talk to your care team about the use of this medication in children. Special care may be needed. Overdosage: If you think you have taken too much of this medicine contact a poison control center or emergency room at once. NOTE: This medicine is only for you. Do not share this medicine with others. What if I miss a dose? Keep appointments for follow-up doses. It is important not to miss your dose. Call your care team if you are unable to keep an appointment. What may interact with this medication? Do not take this medication with any of the following: Live virus vaccines This medication may also interact with the following: Medications that treat or prevent blood clots, such as warfarin, enoxaparin, dalteparin This list may not describe all possible interactions. Give your health care provider a list of all the medicines, herbs, non-prescription drugs, or dietary supplements you use. Also tell them if you smoke, drink alcohol, or use illegal drugs. Some items may interact with your medicine. What should I watch for while using this medication? Your condition will be monitored carefully while you are receiving this medication. This medication may make you feel generally unwell. This is not uncommon as chemotherapy can affect healthy cells as well as cancer cells. Report any side effects. Continue your course of treatment even though you feel ill unless your care team tells you to stop. In some cases, you may be given additional medications to help with side effects. Follow all directions for their use. This  medication may increase your risk of getting an infection. Call your care team for advice if you get a fever, chills, sore throat, or other symptoms of a cold or flu. Do not treat yourself. Try to avoid being around people who are sick. This medication may increase your risk to bruise or bleed. Call your care team if you notice any unusual bleeding. Be careful brushing or flossing your teeth or using a toothpick because you may get an infection or bleed more easily. If you have any dental work done, tell your dentist you are receiving this medication. Avoid taking medications that contain aspirin, acetaminophen , ibuprofen, naproxen, or ketoprofen unless instructed by your care team. These medications may hide a fever. Do not treat diarrhea with over the counter products. Contact your care team if you have diarrhea that lasts more than  2 days or if it is severe and watery. This medication can make you more sensitive to the sun. Keep out of the sun. If you cannot avoid being in the sun, wear protective clothing and sunscreen. Do not use sun lamps, tanning beds, or tanning booths. Talk to your care team if you or your partner wish to become pregnant or think you might be pregnant. This medication can cause serious birth defects if taken during pregnancy and for 3 months after the last dose. A reliable form of contraception is recommended while taking this medication and for 3 months after the last dose. Talk to your care team about effective forms of contraception. Do not father a child while taking this medication and for 3 months after the last dose. Use a condom while having sex during this time period. Do not breastfeed while taking this medication. This medication may cause infertility. Talk to your care team if you are concerned about your fertility. What side effects may I notice from receiving this medication? Side effects that you should report to your care team as soon as possible: Allergic  reactions--skin rash, itching, hives, swelling of the face, lips, tongue, or throat Heart attack--pain or tightness in the chest, shoulders, arms, or jaw, nausea, shortness of breath, cold or clammy skin, feeling faint or lightheaded Heart failure--shortness of breath, swelling of the ankles, feet, or hands, sudden weight gain, unusual weakness or fatigue Heart rhythm changes--fast or irregular heartbeat, dizziness, feeling faint or lightheaded, chest pain, trouble breathing High ammonia level--unusual weakness or fatigue, confusion, loss of appetite, nausea, vomiting, seizures Infection--fever, chills, cough, sore throat, wounds that don't heal, pain or trouble when passing urine, general feeling of discomfort or being unwell Low red blood cell level--unusual weakness or fatigue, dizziness, headache, trouble breathing Pain, tingling, or numbness in the hands or feet, muscle weakness, change in vision, confusion or trouble speaking, loss of balance or coordination, trouble walking, seizures Redness, swelling, and blistering of the skin over hands and feet Severe or prolonged diarrhea Unusual bruising or bleeding Side effects that usually do not require medical attention (report to your care team if they continue or are bothersome): Dry skin Headache Increased tears Nausea Pain, redness, or swelling with sores inside the mouth or throat Sensitivity to light Vomiting This list may not describe all possible side effects. Call your doctor for medical advice about side effects. You may report side effects to FDA at 1-800-FDA-1088. Where should I keep my medication? This medication is given in a hospital or clinic. It will not be stored at home. NOTE: This sheet is a summary. It may not cover all possible information. If you have questions about this medicine, talk to your doctor, pharmacist, or health care provider.  2024 Elsevier/Gold Standard (2021-09-23 00:00:00)[facility] [place] - A DEPT  OF [facility]  Discharge Instructions: Thank you for choosing [facility] to provide your oncology and hematology care.  If you have a lab appointment with the Cancer Center, please go directly to the Cancer Center and check in at the registration area.   Wear comfortable clothing and clothing appropriate for easy access to any Portacath or PICC line.   We strive to give you quality time with your provider. You may need to reschedule your appointment if you arrive late (15 or more minutes).  Arriving late affects you and other patients whose appointments are after yours.  Also, if you miss three or more appointments without  notifying the office, you may be dismissed from the clinic at the provider's discretion.      For prescription refill requests, have your pharmacy contact our office and allow 72 hours for refills to be completed.    Today you received the following chemotherapy and/or immunotherapy agents BEVACIZUMAB , FLUOROURACIL , OXALIPLATIN       To help prevent nausea and vomiting after your treatment, we encourage you to take your nausea medication as directed.  BELOW ARE SYMPTOMS THAT SHOULD BE REPORTED IMMEDIATELY: *FEVER GREATER THAN 100.4 F (38 C) OR HIGHER *CHILLS OR SWEATING *NAUSEA AND VOMITING THAT IS NOT CONTROLLED WITH YOUR NAUSEA MEDICATION *UNUSUAL SHORTNESS OF BREATH *UNUSUAL BRUISING OR BLEEDING *URINARY PROBLEMS (pain or burning when urinating, or frequent urination) *BOWEL PROBLEMS (unusual diarrhea, constipation, pain near the anus) TENDERNESS IN MOUTH AND THROAT WITH OR WITHOUT PRESENCE OF ULCERS (sore throat, sores in mouth, or a toothache) UNUSUAL RASH, SWELLING OR PAIN  UNUSUAL VAGINAL DISCHARGE OR ITCHING   Items with * indicate a potential emergency and should be followed up as soon as possible or go to the Emergency Department if any problems should occur.  Please show the CHEMOTHERAPY ALERT CARD or IMMUNOTHERAPY ALERT  CARD at check-in to the Emergency Department and triage nurse.  Should you have questions after your visit or need to cancel or reschedule your appointment, please contact [facility] [place] - A DEPT OF [facility]  Dept: [phone]  and follow the prompts.  Office hours are 8:00 a.m. to 4:30 p.m. Monday - Friday. Please note that voicemails left after 4:00 p.m. may not be returned until the following business day.  We are closed weekends and major holidays. You have access to a nurse at all times for urgent questions. Please call the main number to the clinic Dept: [phone] and follow the prompts.  For any non-urgent questions, you may also contact your provider using MyChart. We now offer e-Visits for anyone 48 and older to request care online for non-urgent symptoms. For details visit [email or web address].   Also download the MyChart app! Go to the app store, search MyChart, open the app, select [facility], and log in with your MyChart username and password.

## 2023-12-15 NOTE — Telephone Encounter (Signed)
 Patient has been scheduled for follow-up visit per 12/15/23 LOS.  Pt given an appt calendar with date and time.

## 2023-12-16 ENCOUNTER — Other Ambulatory Visit: Payer: Self-pay | Admitting: Family

## 2023-12-16 DIAGNOSIS — Z1231 Encounter for screening mammogram for malignant neoplasm of breast: Secondary | ICD-10-CM

## 2023-12-17 ENCOUNTER — Encounter: Payer: Self-pay | Admitting: Oncology

## 2023-12-17 ENCOUNTER — Other Ambulatory Visit: Payer: Self-pay | Admitting: Pharmacist

## 2023-12-17 ENCOUNTER — Inpatient Hospital Stay

## 2023-12-17 VITALS — BP 138/73 | HR 95 | Temp 98.7°F | Resp 14

## 2023-12-17 DIAGNOSIS — E860 Dehydration: Secondary | ICD-10-CM

## 2023-12-17 DIAGNOSIS — C787 Secondary malignant neoplasm of liver and intrahepatic bile duct: Secondary | ICD-10-CM

## 2023-12-17 DIAGNOSIS — C7800 Secondary malignant neoplasm of unspecified lung: Secondary | ICD-10-CM

## 2023-12-17 DIAGNOSIS — C189 Malignant neoplasm of colon, unspecified: Secondary | ICD-10-CM

## 2023-12-17 DIAGNOSIS — Z5111 Encounter for antineoplastic chemotherapy: Secondary | ICD-10-CM | POA: Diagnosis not present

## 2023-12-17 DIAGNOSIS — C182 Malignant neoplasm of ascending colon: Secondary | ICD-10-CM

## 2023-12-17 DIAGNOSIS — R110 Nausea: Secondary | ICD-10-CM

## 2023-12-17 MED ORDER — PEGFILGRASTIM 6 MG/0.6ML ~~LOC~~ PSKT
6.0000 mg | PREFILLED_SYRINGE | Freq: Once | SUBCUTANEOUS | Status: AC
  Administered 2023-12-17: 6 mg via SUBCUTANEOUS
  Filled 2023-12-17: qty 0.6

## 2023-12-17 MED ORDER — HEPARIN SOD (PORK) LOCK FLUSH 100 UNIT/ML IV SOLN: 500.0000 [IU] | Freq: Once | INTRAVENOUS | Status: DC | PRN

## 2023-12-17 MED ORDER — SODIUM CHLORIDE 0.9 % IV SOLN: Freq: Once | INTRAVENOUS | Status: DC

## 2023-12-17 MED ORDER — SODIUM CHLORIDE 0.9% FLUSH
10.0000 mL | Freq: Once | INTRAVENOUS | Status: AC | PRN
  Administered 2023-12-17: 10 mL

## 2023-12-17 MED ORDER — LORAZEPAM 2 MG/ML IJ SOLN
1.0000 mg | Freq: Once | INTRAMUSCULAR | Status: AC
  Administered 2023-12-17: 1 mg via INTRAVENOUS
  Filled 2023-12-17: qty 1

## 2023-12-17 MED ORDER — SODIUM CHLORIDE 0.9 % IV SOLN: Freq: Once | INTRAVENOUS | Status: AC

## 2023-12-17 MED ORDER — SODIUM CHLORIDE 0.9% FLUSH: 10.0000 mL | INTRAVENOUS | Status: DC | PRN

## 2023-12-17 MED ORDER — DEXAMETHASONE SODIUM PHOSPHATE 10 MG/ML IJ SOLN
10.0000 mg | Freq: Once | INTRAMUSCULAR | Status: AC
  Administered 2023-12-17: 10 mg via INTRAVENOUS
  Filled 2023-12-17: qty 1

## 2023-12-17 MED ORDER — FAMOTIDINE IN NACL 20-0.9 MG/50ML-% IV SOLN
20.0000 mg | Freq: Once | INTRAVENOUS | Status: AC
  Administered 2023-12-17: 20 mg via INTRAVENOUS
  Filled 2023-12-17: qty 50

## 2023-12-17 MED ORDER — HEPARIN SOD (PORK) LOCK FLUSH 100 UNIT/ML IV SOLN
500.0000 [IU] | Freq: Once | INTRAVENOUS | Status: AC | PRN
  Administered 2023-12-17: 500 [IU]

## 2023-12-17 MED ORDER — ALTEPLASE 2 MG IJ SOLR
2.0000 mg | Freq: Once | INTRAMUSCULAR | Status: DC | PRN
Start: 2023-12-17 — End: 2023-12-17

## 2023-12-17 NOTE — Progress Notes (Signed)
 Patient presents to clinic reporting more nausea than usual states that she has taken compazine  today without an improvement in nausea- states that she has vomited several times. Dr. Ezzard aware and orders received.

## 2023-12-17 NOTE — Patient Instructions (Signed)
 Managing Chemotherapy Side Effects, Adult Chemotherapy is a treatment that uses medicine to kill cancer cells. However, in addition to killing cancer cells, the medicines can also damage healthy cells. The damage to healthy cells can lead to side effects. The exact side effects depend on the specific medicines used. Most of the side effects of chemotherapy go away once treatment is finished. Until then, work closely with your health care providers and take an active role in managing your side effects. What are common side effects of chemotherapy? Increased risk of infection, bruising, or bleeding. Nausea and vomiting. Constipation or diarrhea. Loss of appetite. Hair loss. Mouth or throat sores. Tiredness (fatigue). Tingling, pain, or numbness in the hands and feet. Dry, sensitive, itchy, or sore skin. Sleep disturbances, such as excessive sleepiness. Confusion, anxiety, or mood swings. Memory changes. How to manage the side effects of chemotherapy Medicines Take over-the-counter and prescription medicines only as told by your health care provider. Talk with your health care provider before taking vitamins, herbs, supplements, or over-the-counter medicines. Some of these can interfere with chemotherapy. Activity Get plenty of rest. Get regular exercise by doing activities such as walking, gentle yoga, or tai chi. Return to your normal activities as told by your health care provider. Ask your health care provider what activities are safe for you. Eating and drinking  Talk to a dietitian about what you should eat and drink during cancer treatment. Drink enough fluid to keep your urine pale yellow. If you have side effects that affect eating, these tips may help: Eat smaller meals and snacks often. Drink high-nutrition and high-calorie shakes or supplements. Choose bland and soft foods that are easy to eat. Do not eat foods that are hot, spicy, or hard to swallow. Do not eat raw or  undercooked meat, eggs, or seafood. Always wash fresh fruits and vegetables well before eating them. Skin care If you have sore or itchy skin: Wear soft, comfortable clothing. Apply creams and ointments to your skin as told by your health care provider. If you lose your hair, consider wearing a wig, hat, or scarf to cover your head. You may want to have someone shave your head as you start to lose hair. During outdoor activities, protect your head and skin from the sun by using sunscreen with an SPF of 30 or higher or by wearing protective clothing and a hat. Meet with a hair and skin care specialist for makeup and skin care tips. Apply sunscreen to your scalp as told by your health care provider. General tips Learn as much as you can about your condition. If you are struggling emotionally, talk with a mental health care provider or join a support group. Keep all follow-up visits. This is important. How to prevent infection and bleeding Chemotherapy may lower your blood counts and put you at risk for infection and bleeding. Here are some ways to help prevent problems. Vaccines Talk to your health care provider about vaccines. You should not get any live vaccines, such as the polio, MMR, chickenpox, and shingles vaccines until your health care provider says that it is safe to do so. Do not be around people who have had live vaccines for as long as your health care provider recommends. Make sure you get a yearly flu shot. People who will be near you should also get a yearly flu shot. Social activity Stay away from crowded places where you could be exposed to germs. Do not be around people who may be sick or  people who have symptoms of a fever until they have been fever-free for at least 24 hours. Do not share food, cups, straws, or utensils with other people. Wear a mask when outside the home if your blood counts are low. Cleanliness  Wash your hands often for at least 20 seconds. Also make  sure that other members of your household wash their hands often. Brush your teeth twice daily using a soft toothbrush. Use mouth rinse only as told by your health care provider. Take a bath or shower daily unless your health care provider gives different instructions. General tips Take your temperature regularly, especially if you have chills or feel warm. Check with your health care provider: Before you travel. Before you have a dental procedure. Before you use a swimming pool, hot tub, or swim in a lake or ocean. If you get chemotherapy through an IV or port, check the site every day for signs of infection. Check for redness, swelling, pain, fluid, and warmth. Avoid activities that put you at risk for injury or bleeding. Use an electric razor to shave instead of a blade. Questions to ask your health care provider What are the most common side effects of my treatment? How will they affect my daily life? What can I do to manage them? What are some possible long-term side effects? What are possible complications? What support services are available? What number can I call with questions or concerns? Where to find support Cancer affects the entire family. Find out what family support resources are available from your cancer treatment center. For more support, turn to: Your cancer care team. Friends and family. Your religious community. Other people with cancer. Community-based or online support groups. Where to find more information National Cancer Institute: www.cancer.gov American Cancer Society: www.cancer.org Contact a health care provider if: You bleed or bruise more often. You notice blood in your urine or stool. You have any of these symptoms: A skin rash, or dry or itchy skin. A headache or stiff neck. Cold or flu symptoms. A cough. Persistent nausea or vomiting. Persistent diarrhea. Frequent urination, burning when passing urine, or foul-smelling urine. You cannot eat  because of mouth or throat pain. You are sad, confused, anxious, or depressed. Get help right away if: You have any of these symptoms: A fever or chills. Your health care provider should know about this right away. Redness, swelling, pain, fluid, or warmth near an IV site. Bleeding that you cannot stop. A seizure. You cannot swallow. You have chest pain. You have trouble breathing. A family member or caregiver should get help right away if you have a sudden or unusual change in behavior. These symptoms may be an emergency. Get help right away. Call 911. Do not wait to see if the symptoms will go away. Do not drive yourself to the hospital. Summary Chemotherapy is a treatment that uses medicine to kill cancer cells and can cause side effects. The specific side effects depend on the specific medicines used. Learn as much as you can about your condition. Ask about side effects to watch for and how to treat them. Seek out support and resources from others. Find out what family support resources are available from your cancer treatment center. Let your health care provider know if you notice any new, unusual, or worsening symptoms, especially fever or chills. This information is not intended to replace advice given to you by your health care provider. Make sure you discuss any questions you have with your health  care provider. Document Revised: 05/08/2021 Document Reviewed: 05/08/2021 Elsevier Patient Education  2024 [employer].

## 2023-12-18 DIAGNOSIS — C189 Malignant neoplasm of colon, unspecified: Secondary | ICD-10-CM | POA: Diagnosis not present

## 2023-12-24 DIAGNOSIS — C189 Malignant neoplasm of colon, unspecified: Secondary | ICD-10-CM | POA: Diagnosis not present

## 2024-01-03 ENCOUNTER — Ambulatory Visit (HOSPITAL_BASED_OUTPATIENT_CLINIC_OR_DEPARTMENT_OTHER)
Admission: RE | Admit: 2024-01-03 | Discharge: 2024-01-03 | Disposition: A | Discharge status: Home / Self Care | Source: Ambulatory Visit | Attending: Oncology | Admitting: Oncology

## 2024-01-03 DIAGNOSIS — Z780 Asymptomatic menopausal state: Secondary | ICD-10-CM

## 2024-01-03 DIAGNOSIS — M85852 Other specified disorders of bone density and structure, left thigh: Secondary | ICD-10-CM | POA: Diagnosis not present

## 2024-01-03 DIAGNOSIS — C189 Malignant neoplasm of colon, unspecified: Secondary | ICD-10-CM

## 2024-01-03 NOTE — Progress Notes (Unsigned)
 [facility]  [address] [place],  [place]  [address] [phone]  Clinic Day:  01/04/2024  Referring physician: Glenford Harlene SAILOR, NP  HISTORY OF PRESENT ILLNESS:  The patient is a 58 y.o. female with metastatic colon cancer, which includes spread of disease to her abdominal cavity, lungs, brain, liver, and right adrenal gland.  She comes in today to be evaluated before heading into her 28th cycle of palliative chemotherapy, which consists of FOLFOX/Avastin .  Since her last visit, the patient has been doing fairly well.  She still has intermittent constipation.  However, she denies having any abdominal pain at this time or other new GI symptoms/findings which concern her for overt signs of disease progression.    With respect to her colon cancer history, she is status post a right hemicolectomy in early October 2018, followed by 12 cycles of adjuvant FOLFOX chemotherapy, which were completed in April 2019.  In December 2021, she underwent a left cerebellar metastasectomy, whose pathology was consistent with metastatic colon cancer.  Tumor testing did come back MMR normal. CT scans revealed evidence of her cancer being in multiple locations, for which she took 40 cycles of FOLFIRI/Avastin .  As follow-up scans revealed liver metastasis, she was placed back on FOLFOX/Avastin , for which she continues to take.  PHYSICAL EXAM:  Blood pressure 111/67, pulse 91, temperature 98.2 F (36.8 C), temperature source Oral, resp. rate 16, height 5' 8 (1.727 m), weight 144 lb (65.3 kg), last menstrual period 02/22/2019, SpO2 100%. Body mass index is 21.9 kg/m.  Performance status (ECOG): 1 - Symptomatic but completely ambulatory  Physical Exam Vitals and nursing note reviewed.  Constitutional:      General: She is not in acute distress.    Appearance: Normal appearance.  HENT:     Head: Normocephalic and atraumatic.     Mouth/Throat:     Mouth: Mucous membranes are  moist.     Pharynx: Oropharynx is clear. No oropharyngeal exudate or posterior oropharyngeal erythema.  Eyes:     General: No scleral icterus.    Extraocular Movements: Extraocular movements intact.     Conjunctiva/sclera: Conjunctivae normal.     Pupils: Pupils are equal, round, and reactive to light.  Cardiovascular:     Rate and Rhythm: Normal rate and regular rhythm.     Heart sounds: Normal heart sounds. No murmur heard.    No friction rub. No gallop.  Pulmonary:     Effort: Pulmonary effort is normal.     Breath sounds: Normal breath sounds. No wheezing, rhonchi or rales.  Abdominal:     General: There is no distension.     Palpations: Abdomen is soft. There is no hepatomegaly, splenomegaly or mass.     Tenderness: There is no abdominal tenderness.  Musculoskeletal:        General: Normal range of motion.     Cervical back: Normal range of motion and neck supple. No tenderness.     Right lower leg: No edema.     Left lower leg: No edema.  Lymphadenopathy:     Cervical: No cervical adenopathy.     Upper Body:     Right upper body: No supraclavicular or axillary adenopathy.     Left upper body: No supraclavicular or axillary adenopathy.     Lower Body: No right inguinal adenopathy. No left inguinal adenopathy.  Skin:    General: Skin is warm and dry.     Coloration: Skin is not jaundiced.  Findings: No rash.  Neurological:     Mental Status: She is alert and oriented to person, place, and time.     Cranial Nerves: No cranial nerve deficit.  Psychiatric:        Mood and Affect: Mood normal.        Behavior: Behavior normal.        Thought Content: Thought content normal.    LABS:      Latest Ref Rng & Units 01/04/2024    9:53 AM 12/15/2023    9:46 AM 11/05/2023    3:20 PM  CBC  WBC 4.0 - 10.5 K/uL 4.0  4.9  3.2   Hemoglobin 12.0 - 15.0 g/dL 88.8  88.0  87.3   Hematocrit 36.0 - 46.0 % 34.3  37.3  38.7   Platelets 150 - 400 K/uL 34  50  32       Latest Ref Rng &  Units 01/04/2024    9:53 AM 12/15/2023    9:46 AM 11/03/2023   10:55 AM  CMP  Glucose 70 - 99 mg/dL 698  771  873   BUN 6 - 20 mg/dL 14  13  17    Creatinine 0.44 - 1.00 mg/dL 8.73  8.67  8.53   Sodium 135 - 145 mmol/L 139  140  141   Potassium 3.5 - 5.1 mmol/L 4.3  4.5  4.2   Chloride 98 - 111 mmol/L 102  100  103   CO2 22 - 32 mmol/L 27  28  26    Calcium  8.9 - 10.3 mg/dL 9.3  9.5  9.8   Total Protein 6.5 - 8.1 g/dL 6.0  6.5  6.5   Total Bilirubin 0.0 - 1.2 mg/dL 0.4  0.4  0.5   Alkaline Phos 38 - 126 U/L 338  297  227   AST 15 - 41 U/L 39  38  31   ALT 0 - 44 U/L 31  34  18    ASSESSMENT & PLAN:  Assessment/Plan:  A 58 y.o. female with metastatic colon cancer.  She will proceed her 28th cycle of FOLFOX/Avastin  today. 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  Based upon this, I would consider giving this patient oral therapy which blocks MET amplification in the future if she has no other viable options with respect to her treatment.  I would also consider having her enroll in clinical trial locally if studies involving such agents are able in the area.  With respect to her chemotherapy-induced thrombocytopenia, I will arrange for her to receive 1 unit of platelets before the end of the week.  Otherwise, as she is doing well, I will see her back in 3 weeks before she heads into her 29th cycle of FOLFOX/Avastin .  The patient understands all the plans discussed today and is in agreement with them.  Aeva Posey DELENA Kerns, MD

## 2024-01-04 ENCOUNTER — Inpatient Hospital Stay

## 2024-01-04 ENCOUNTER — Inpatient Hospital Stay (HOSPITAL_BASED_OUTPATIENT_CLINIC_OR_DEPARTMENT_OTHER): Admitting: Oncology

## 2024-01-04 ENCOUNTER — Other Ambulatory Visit: Payer: Self-pay

## 2024-01-04 ENCOUNTER — Encounter: Payer: Self-pay | Admitting: Oncology

## 2024-01-04 ENCOUNTER — Inpatient Hospital Stay: Attending: Oncology

## 2024-01-04 DIAGNOSIS — C7931 Secondary malignant neoplasm of brain: Secondary | ICD-10-CM | POA: Diagnosis not present

## 2024-01-04 DIAGNOSIS — D6959 Other secondary thrombocytopenia: Secondary | ICD-10-CM | POA: Insufficient documentation

## 2024-01-04 DIAGNOSIS — C7800 Secondary malignant neoplasm of unspecified lung: Secondary | ICD-10-CM

## 2024-01-04 DIAGNOSIS — Z5111 Encounter for antineoplastic chemotherapy: Secondary | ICD-10-CM | POA: Insufficient documentation

## 2024-01-04 DIAGNOSIS — C7801 Secondary malignant neoplasm of right lung: Secondary | ICD-10-CM | POA: Diagnosis not present

## 2024-01-04 DIAGNOSIS — C787 Secondary malignant neoplasm of liver and intrahepatic bile duct: Secondary | ICD-10-CM | POA: Diagnosis not present

## 2024-01-04 DIAGNOSIS — D696 Thrombocytopenia, unspecified: Secondary | ICD-10-CM

## 2024-01-04 DIAGNOSIS — C182 Malignant neoplasm of ascending colon: Secondary | ICD-10-CM

## 2024-01-04 DIAGNOSIS — Z5189 Encounter for other specified aftercare: Secondary | ICD-10-CM | POA: Diagnosis not present

## 2024-01-04 DIAGNOSIS — C7971 Secondary malignant neoplasm of right adrenal gland: Secondary | ICD-10-CM | POA: Insufficient documentation

## 2024-01-04 DIAGNOSIS — C786 Secondary malignant neoplasm of retroperitoneum and peritoneum: Secondary | ICD-10-CM | POA: Insufficient documentation

## 2024-01-04 DIAGNOSIS — C189 Malignant neoplasm of colon, unspecified: Secondary | ICD-10-CM | POA: Insufficient documentation

## 2024-01-04 DIAGNOSIS — C7802 Secondary malignant neoplasm of left lung: Secondary | ICD-10-CM | POA: Diagnosis not present

## 2024-01-04 LAB — CBC WITH DIFFERENTIAL (CANCER CENTER ONLY)
Abs Immature Granulocytes: 0.02 K/uL (ref 0.00–0.07)
Basophils Absolute: 0 K/uL (ref 0.0–0.1)
Basophils Relative: 1 %
Eosinophils Absolute: 0.1 K/uL (ref 0.0–0.5)
Eosinophils Relative: 2 %
HCT: 34.3 % — ABNORMAL LOW (ref 36.0–46.0)
Hemoglobin: 11.1 g/dL — ABNORMAL LOW (ref 12.0–15.0)
Immature Granulocytes: 1 %
Lymphocytes Relative: 21 %
Lymphs Abs: 0.8 K/uL (ref 0.7–4.0)
MCH: 32.7 pg (ref 26.0–34.0)
MCHC: 32.4 g/dL (ref 30.0–36.0)
MCV: 101.2 fL — ABNORMAL HIGH (ref 80.0–100.0)
Monocytes Absolute: 0.4 K/uL (ref 0.1–1.0)
Monocytes Relative: 10 %
Neutro Abs: 2.7 K/uL (ref 1.7–7.7)
Neutrophils Relative %: 65 %
Platelet Count: 34 K/uL — ABNORMAL LOW (ref 150–400)
RBC: 3.39 MIL/uL — ABNORMAL LOW (ref 3.87–5.11)
RDW: 15.4 % (ref 11.5–15.5)
WBC Count: 4 K/uL (ref 4.0–10.5)
nRBC: 0 % (ref 0.0–0.2)

## 2024-01-04 LAB — CMP (CANCER CENTER ONLY)
ALT: 31 U/L (ref 0–44)
AST: 39 U/L (ref 15–41)
Albumin: 3.6 g/dL (ref 3.5–5.0)
Alkaline Phosphatase: 338 U/L — ABNORMAL HIGH (ref 38–126)
Anion gap: 10 (ref 5–15)
BUN: 14 mg/dL (ref 6–20)
CO2: 27 mmol/L (ref 22–32)
Calcium: 9.3 mg/dL (ref 8.9–10.3)
Chloride: 102 mmol/L (ref 98–111)
Creatinine: 1.26 mg/dL — ABNORMAL HIGH (ref 0.44–1.00)
GFR, Estimated: 50 mL/min — ABNORMAL LOW (ref >60)
Glucose, Bld: 301 mg/dL — ABNORMAL HIGH (ref 70–99)
Potassium: 4.3 mmol/L (ref 3.5–5.1)
Sodium: 139 mmol/L (ref 135–145)
Total Bilirubin: 0.4 mg/dL (ref 0.0–1.2)
Total Protein: 6 g/dL — ABNORMAL LOW (ref 6.5–8.1)

## 2024-01-04 LAB — TOTAL PROTEIN, URINE DIPSTICK: Protein, ur: NEGATIVE mg/dL

## 2024-01-04 MED ORDER — OXALIPLATIN CHEMO INJECTION 50 MG/10ML
63.7500 mg/m2 | Freq: Once | INTRAVENOUS | Status: AC
  Administered 2024-01-04: 115 mg via INTRAVENOUS
  Filled 2024-01-04: qty 20

## 2024-01-04 MED ORDER — DIPHENHYDRAMINE HCL 50 MG/ML IJ SOLN
25.0000 mg | Freq: Once | INTRAMUSCULAR | Status: AC
  Administered 2024-01-04: 25 mg via INTRAVENOUS
  Filled 2024-01-04: qty 1

## 2024-01-04 MED ORDER — PALONOSETRON HCL INJECTION 0.25 MG/5ML
0.2500 mg | Freq: Once | INTRAVENOUS | Status: AC
  Administered 2024-01-04: 0.25 mg via INTRAVENOUS
  Filled 2024-01-04: qty 5

## 2024-01-04 MED ORDER — FAMOTIDINE IN NACL 20-0.9 MG/50ML-% IV SOLN
20.0000 mg | Freq: Once | INTRAVENOUS | Status: AC
  Administered 2024-01-04: 20 mg via INTRAVENOUS
  Filled 2024-01-04: qty 50

## 2024-01-04 MED ORDER — SODIUM CHLORIDE 0.9 % IV SOLN
150.0000 mg | Freq: Once | INTRAVENOUS | Status: AC
  Administered 2024-01-04: 150 mg via INTRAVENOUS
  Filled 2024-01-04: qty 150

## 2024-01-04 MED ORDER — SODIUM CHLORIDE 0.9 % IV SOLN: Freq: Once | INTRAVENOUS | Status: AC

## 2024-01-04 MED ORDER — FLUOROURACIL CHEMO INJECTION 2.5 GM/50ML
400.0000 mg/m2 | Freq: Once | INTRAVENOUS | Status: AC
  Administered 2024-01-04: 700 mg via INTRAVENOUS
  Filled 2024-01-04: qty 14

## 2024-01-04 MED ORDER — SODIUM CHLORIDE 0.9 % IV SOLN
5.0000 mg/kg | Freq: Once | INTRAVENOUS | Status: AC
  Administered 2024-01-04: 350 mg via INTRAVENOUS
  Filled 2024-01-04: qty 14

## 2024-01-04 MED ORDER — DEXAMETHASONE SODIUM PHOSPHATE 10 MG/ML IJ SOLN
10.0000 mg | Freq: Once | INTRAMUSCULAR | Status: AC
  Administered 2024-01-04: 10 mg via INTRAVENOUS
  Filled 2024-01-04: qty 1

## 2024-01-04 MED ORDER — SODIUM CHLORIDE 0.9 % IV SOLN
2400.0000 mg/m2 | INTRAVENOUS | Status: DC
  Administered 2024-01-04: 4350 mg via INTRAVENOUS
  Filled 2024-01-04: qty 87

## 2024-01-04 MED ORDER — DEXTROSE 5 % IV SOLN: Freq: Once | INTRAVENOUS | Status: AC

## 2024-01-04 NOTE — Progress Notes (Signed)
 [id number]-Report to jennifer lednum, rn

## 2024-01-04 NOTE — Patient Instructions (Signed)
 Fluorouracil Injection What is this medication? FLUOROURACIL (flure oh YOOR a sil) treats some types of cancer. It works by slowing down the growth of cancer cells. This medicine may be used for other purposes; ask your health care provider or pharmacist if you have questions. COMMON BRAND NAME(S): Adrucil What should I tell my care team before I take this medication? They need to know if you have any of these conditions: Blood disorders Dihydropyrimidine dehydrogenase (DPD) deficiency Infection, such as chickenpox, cold sores, herpes Kidney disease Liver disease Poor nutrition Recent or ongoing radiation therapy An unusual or allergic reaction to fluorouracil, other medications, foods, dyes, or preservatives If you or your partner are pregnant or trying to get pregnant Breast-feeding How should I use this medication? This medication is injected into a vein. It is administered by your care team in a hospital or clinic setting. Talk to your care team about the use of this medication in children. Special care may be needed. Overdosage: If you think you have taken too much of this medicine contact a poison control center or emergency room at once. NOTE: This medicine is only for you. Do not share this medicine with others. What if I miss a dose? Keep appointments for follow-up doses. It is important not to miss your dose. Call your care team if you are unable to keep an appointment. What may interact with this medication? Do not take this medication with any of the following: Live virus vaccines This medication may also interact with the following: Medications that treat or prevent blood clots, such as warfarin, enoxaparin, dalteparin This list may not describe all possible interactions. Give your health care provider a list of all the medicines, herbs, non-prescription drugs, or dietary supplements you use. Also tell them if you smoke, drink alcohol, or use illegal drugs. Some items may  interact with your medicine. What should I watch for while using this medication? Your condition will be monitored carefully while you are receiving this medication. This medication may make you feel generally unwell. This is not uncommon as chemotherapy can affect healthy cells as well as cancer cells. Report any side effects. Continue your course of treatment even though you feel ill unless your care team tells you to stop. In some cases, you may be given additional medications to help with side effects. Follow all directions for their use. This medication may increase your risk of getting an infection. Call your care team for advice if you get a fever, chills, sore throat, or other symptoms of a cold or flu. Do not treat yourself. Try to avoid being around people who are sick. This medication may increase your risk to bruise or bleed. Call your care team if you notice any unusual bleeding. Be careful brushing or flossing your teeth or using a toothpick because you may get an infection or bleed more easily. If you have any dental work done, tell your dentist you are receiving this medication. Avoid taking medications that contain aspirin, acetaminophen, ibuprofen, naproxen, or ketoprofen unless instructed by your care team. These medications may hide a fever. Do not treat diarrhea with over the counter products. Contact your care team if you have diarrhea that lasts more than 2 days or if it is severe and watery. This medication can make you more sensitive to the sun. Keep out of the sun. If you cannot avoid being in the sun, wear protective clothing and sunscreen. Do not use sun lamps, tanning beds, or tanning booths. Talk to  your care team if you or your partner wish to become pregnant or think you might be pregnant. This medication can cause serious birth defects if taken during pregnancy and for 3 months after the last dose. A reliable form of contraception is recommended while taking this  medication and for 3 months after the last dose. Talk to your care team about effective forms of contraception. Do not father a child while taking this medication and for 3 months after the last dose. Use a condom while having sex during this time period. Do not breastfeed while taking this medication. This medication may cause infertility. Talk to your care team if you are concerned about your fertility. What side effects may I notice from receiving this medication? Side effects that you should report to your care team as soon as possible: Allergic reactions--skin rash, itching, hives, swelling of the face, lips, tongue, or throat Heart attack--pain or tightness in the chest, shoulders, arms, or jaw, nausea, shortness of breath, cold or clammy skin, feeling faint or lightheaded Heart failure--shortness of breath, swelling of the ankles, feet, or hands, sudden weight gain, unusual weakness or fatigue Heart rhythm changes--fast or irregular heartbeat, dizziness, feeling faint or lightheaded, chest pain, trouble breathing High ammonia level--unusual weakness or fatigue, confusion, loss of appetite, nausea, vomiting, seizures Infection--fever, chills, cough, sore throat, wounds that don't heal, pain or trouble when passing urine, general feeling of discomfort or being unwell Low red blood cell level--unusual weakness or fatigue, dizziness, headache, trouble breathing Pain, tingling, or numbness in the hands or feet, muscle weakness, change in vision, confusion or trouble speaking, loss of balance or coordination, trouble walking, seizures Redness, swelling, and blistering of the skin over hands and feet Severe or prolonged diarrhea Unusual bruising or bleeding Side effects that usually do not require medical attention (report to your care team if they continue or are bothersome): Dry skin Headache Increased tears Nausea Pain, redness, or swelling with sores inside the mouth or throat Sensitivity  to light Vomiting This list may not describe all possible side effects. Call your doctor for medical advice about side effects. You may report side effects to FDA at 1-800-FDA-1088. Where should I keep my medication? This medication is given in a hospital or clinic. It will not be stored at home. NOTE: This sheet is a summary. It may not cover all possible information. If you have questions about this medicine, talk to your doctor, pharmacist, or health care provider.  2024 [employer] Standard (2021-09-23 00:00:00)Leucovorin Injection What is this medication? LEUCOVORIN (loo koe VOR in) prevents side effects from certain medications, such as methotrexate. It works by increasing folate levels. This helps protect healthy cells in your body. It may also be used to treat anemia caused by low levels of folate. It can also be used with fluorouracil, a type of chemotherapy, to treat colorectal cancer. It works by increasing the effects of fluorouracil in the body. This medicine may be used for other purposes; ask your health care provider or pharmacist if you have questions. What should I tell my care team before I take this medication? They need to know if you have any of these conditions: Anemia from low levels of vitamin B12 in the blood An unusual or allergic reaction to leucovorin, folic acid, other medications, foods, dyes, or preservatives Pregnant or trying to get pregnant Breastfeeding How should I use this medication? This medication is injected into a vein or a muscle. It is given by your care team in  a hospital or clinic setting. Talk to your care team about the use of this medication in children. Special care may be needed. Overdosage: If you think you have taken too much of this medicine contact a poison control center or emergency room at once. NOTE: This medicine is only for you. Do not share this medicine with others. What if I miss a dose? Keep appointments for follow-up doses.  It is important not to miss your dose. Call your care team if you are unable to keep an appointment. What may interact with this medication? Capecitabine Fluorouracil Phenobarbital Phenytoin Primidone Trimethoprim;sulfamethoxazole This list may not describe all possible interactions. Give your health care provider a list of all the medicines, herbs, non-prescription drugs, or dietary supplements you use. Also tell them if you smoke, drink alcohol, or use illegal drugs. Some items may interact with your medicine. What should I watch for while using this medication? Your condition will be monitored carefully while you are receiving this medication. This medication may increase the side effects of 5-fluorouracil. Tell your care team if you have diarrhea or mouth sores that do not get better or that get worse. What side effects may I notice from receiving this medication? Side effects that you should report to your care team as soon as possible: Allergic reactions--skin rash, itching, hives, swelling of the face, lips, tongue, or throat This list may not describe all possible side effects. Call your doctor for medical advice about side effects. You may report side effects to FDA at 1-800-FDA-1088. Where should I keep my medication? This medication is given in a hospital or clinic. It will not be stored at home. NOTE: This sheet is a summary. It may not cover all possible information. If you have questions about this medicine, talk to your doctor, pharmacist, or health care provider.  2024 [employer]/Gold Standard (2021-10-21 00:00:00)Oxaliplatin Injection What is this medication? OXALIPLATIN (ox AL i PLA tin) treats colorectal cancer. It works by slowing down the growth of cancer cells. This medicine may be used for other purposes; ask your health care provider or pharmacist if you have questions. COMMON BRAND NAME(S): Eloxatin What should I tell my care team before I take this medication? They  need to know if you have any of these conditions: Heart disease History of irregular heartbeat or rhythm Liver disease Low blood cell levels (white cells, red cells, and platelets) Lung or breathing disease, such as asthma Take medications that treat or prevent blood clots Tingling of the fingers, toes, or other nerve disorder An unusual or allergic reaction to oxaliplatin, other medications, foods, dyes, or preservatives If you or your partner are pregnant or trying to get pregnant Breast-feeding How should I use this medication? This medication is injected into a vein. It is given by your care team in a hospital or clinic setting. Talk to your care team about the use of this medication in children. Special care may be needed. Overdosage: If you think you have taken too much of this medicine contact a poison control center or emergency room at once. NOTE: This medicine is only for you. Do not share this medicine with others. What if I miss a dose? Keep appointments for follow-up doses. It is important not to miss a dose. Call your care team if you are unable to keep an appointment. What may interact with this medication? Do not take this medication with any of the following: Cisapride Dronedarone Pimozide Thioridazine This medication may also interact with the following:  Aspirin and aspirin-like medications Certain medications that treat or prevent blood clots, such as warfarin, apixaban, dabigatran, and rivaroxaban Cisplatin Cyclosporine Diuretics Medications for infection, such as acyclovir, adefovir, amphotericin B, bacitracin, cidofovir, foscarnet, ganciclovir, gentamicin, pentamidine, vancomycin NSAIDs, medications for pain and inflammation, such as ibuprofen or naproxen Other medications that cause heart rhythm changes Pamidronate Zoledronic acid This list may not describe all possible interactions. Give your health care provider a list of all the medicines, herbs,  non-prescription drugs, or dietary supplements you use. Also tell them if you smoke, drink alcohol, or use illegal drugs. Some items may interact with your medicine. What should I watch for while using this medication? Your condition will be monitored carefully while you are receiving this medication. You may need blood work while taking this medication. This medication may make you feel generally unwell. This is not uncommon as chemotherapy can affect healthy cells as well as cancer cells. Report any side effects. Continue your course of treatment even though you feel ill unless your care team tells you to stop. This medication may increase your risk of getting an infection. Call your care team for advice if you get a fever, chills, sore throat, or other symptoms of a cold or flu. Do not treat yourself. Try to avoid being around people who are sick. Avoid taking medications that contain aspirin, acetaminophen, ibuprofen, naproxen, or ketoprofen unless instructed by your care team. These medications may hide a fever. Be careful brushing or flossing your teeth or using a toothpick because you may get an infection or bleed more easily. If you have any dental work done, tell your dentist you are receiving this medication. This medication can make you more sensitive to cold. Do not drink cold drinks or use ice. Cover exposed skin before coming in contact with cold temperatures or cold objects. When out in cold weather wear warm clothing and cover your mouth and nose to warm the air that goes into your lungs. Tell your care team if you get sensitive to the cold. Talk to your care team if you or your partner are pregnant or think either of you might be pregnant. This medication can cause serious birth defects if taken during pregnancy and for 9 months after the last dose. A negative pregnancy test is required before starting this medication. A reliable form of contraception is recommended while taking this  medication and for 9 months after the last dose. Talk to your care team about effective forms of contraception. Do not father a child while taking this medication and for 6 months after the last dose. Use a condom while having sex during this time period. Do not breastfeed while taking this medication and for 3 months after the last dose. This medication may cause infertility. Talk to your care team if you are concerned about your fertility. What side effects may I notice from receiving this medication? Side effects that you should report to your care team as soon as possible: Allergic reactions--skin rash, itching, hives, swelling of the face, lips, tongue, or throat Bleeding--bloody or black, tar-like stools, vomiting blood or brown material that looks like coffee grounds, red or dark brown urine, small red or purple spots on skin, unusual bruising or bleeding Dry cough, shortness of breath or trouble breathing Heart rhythm changes--fast or irregular heartbeat, dizziness, feeling faint or lightheaded, chest pain, trouble breathing Infection--fever, chills, cough, sore throat, wounds that don't heal, pain or trouble when passing urine, general feeling of discomfort or being unwell  Liver injury--right upper belly pain, loss of appetite, nausea, light-colored stool, dark yellow or brown urine, yellowing skin or eyes, unusual weakness or fatigue Low red blood cell level--unusual weakness or fatigue, dizziness, headache, trouble breathing Muscle injury--unusual weakness or fatigue, muscle pain, dark yellow or brown urine, decrease in amount of urine Pain, tingling, or numbness in the hands or feet Sudden and severe headache, confusion, change in vision, seizures, which may be signs of posterior reversible encephalopathy syndrome (PRES) Unusual bruising or bleeding Side effects that usually do not require medical attention (report to your care team if they continue or are  bothersome): Diarrhea Nausea Pain, redness, or swelling with sores inside the mouth or throat Unusual weakness or fatigue Vomiting This list may not describe all possible side effects. Call your doctor for medical advice about side effects. You may report side effects to FDA at 1-800-FDA-1088. Where should I keep my medication? This medication is given in a hospital or clinic. It will not be stored at home. NOTE: This sheet is a summary. It may not cover all possible information. If you have questions about this medicine, talk to your doctor, pharmacist, or health care provider.  2024 [employer]/Gold Standard (2023-04-30 00:00:00)Bevacizumab Injection What is this medication? BEVACIZUMAB (be va SIZ yoo mab) treats some types of cancer. It works by blocking a protein that causes cancer cells to grow and multiply. This helps to slow or stop the spread of cancer cells. It is a monoclonal antibody. This medicine may be used for other purposes; ask your health care provider or pharmacist if you have questions. COMMON BRAND NAME(S): Alymsys, Avastin, MVASI, [place] Lao What should I tell my care team before I take this medication? They need to know if you have any of these conditions: Blood clots Coughing up blood Having or recent surgery Heart failure High blood pressure History of a connection between 2 or more body parts that do not usually connect (fistula) History of a tear in your stomach or intestines Protein in your urine An unusual or allergic reaction to bevacizumab, other medications, foods, dyes, or preservatives Pregnant or trying to get pregnant Breast-feeding How should I use this medication? This medication is injected into a vein. It is given by your care team in a hospital or clinic setting. Talk to your care team the use of this medication in children. Special care may be needed. Overdosage: If you think you have taken too much of this medicine contact a poison  control center or emergency room at once. NOTE: This medicine is only for you. Do not share this medicine with others. What if I miss a dose? Keep appointments for follow-up doses. It is important not to miss your dose. Call your care team if you are unable to keep an appointment. What may interact with this medication? Interactions are not expected. This list may not describe all possible interactions. Give your health care provider a list of all the medicines, herbs, non-prescription drugs, or dietary supplements you use. Also tell them if you smoke, drink alcohol, or use illegal drugs. Some items may interact with your medicine. What should I watch for while using this medication? Your condition will be monitored carefully while you are receiving this medication. You may need blood work while taking this medication. This medication may make you feel generally unwell. This is not uncommon as chemotherapy can affect healthy cells as well as cancer cells. Report any side effects. Continue your course of treatment even though you feel  ill unless your care team tells you to stop. This medication may increase your risk to bruise or bleed. Call your care team if you notice any unusual bleeding. Before having surgery, talk to your care team to make sure it is ok. This medication can increase the risk of poor healing of your surgical site or wound. You will need to stop this medication for 28 days before surgery. After surgery, wait at least 28 days before restarting this medication. Make sure the surgical site or wound is healed enough before restarting this medication. Talk to your care team if questions. Talk to your care team if you may be pregnant. Serious birth defects can occur if you take this medication during pregnancy and for 6 months after the last dose. Contraception is recommended while taking this medication and for 6 months after the last dose. Your care team can help you find the option that  works for you. Do not breastfeed while taking this medication and for 6 months after the last dose. This medication can cause infertility. Talk to your care team if you are concerned about your fertility. What side effects may I notice from receiving this medication? Side effects that you should report to your care team as soon as possible: Allergic reactions--skin rash, itching, hives, swelling of the face, lips, tongue, or throat Bleeding--bloody or black, tar-like stools, vomiting blood or brown material that looks like coffee grounds, red or dark brown urine, small red or purple spots on skin, unusual bruising or bleeding Blood clot--pain, swelling, or warmth in the leg, shortness of breath, chest pain Heart attack--pain or tightness in the chest, shoulders, arms, or jaw, nausea, shortness of breath, cold or clammy skin, feeling faint or lightheaded Heart failure--shortness of breath, swelling of the ankles, feet, or hands, sudden weight gain, unusual weakness or fatigue Increase in blood pressure Infection--fever, chills, cough, sore throat, wounds that don't heal, pain or trouble when passing urine, general feeling of discomfort or being unwell Infusion reactions--chest pain, shortness of breath or trouble breathing, feeling faint or lightheaded Kidney injury--decrease in the amount of urine, swelling of the ankles, hands, or feet Stomach pain that is severe, does not go away, or gets worse Stroke--sudden numbness or weakness of the face, arm, or leg, trouble speaking, confusion, trouble walking, loss of balance or coordination, dizziness, severe headache, change in vision Sudden and severe headache, confusion, change in vision, seizures, which may be signs of posterior reversible encephalopathy syndrome (PRES) Side effects that usually do not require medical attention (report to your care team if they continue or are bothersome): Back pain Change in taste Diarrhea Dry skin Increased  tears Nosebleed This list may not describe all possible side effects. Call your doctor for medical advice about side effects. You may report side effects to FDA at 1-800-FDA-1088. Where should I keep my medication? This medication is given in a hospital or clinic. It will not be stored at home. NOTE: This sheet is a summary. It may not cover all possible information. If you have questions about this medicine, talk to your doctor, pharmacist, or health care provider.  2024 [employer]/Gold Standard (2021-10-03 00:00:00)

## 2024-01-06 ENCOUNTER — Inpatient Hospital Stay

## 2024-01-06 VITALS — BP 146/86 | HR 91 | Temp 98.0°F | Resp 18

## 2024-01-06 DIAGNOSIS — Z5111 Encounter for antineoplastic chemotherapy: Secondary | ICD-10-CM | POA: Diagnosis not present

## 2024-01-06 DIAGNOSIS — C182 Malignant neoplasm of ascending colon: Secondary | ICD-10-CM

## 2024-01-06 DIAGNOSIS — C189 Malignant neoplasm of colon, unspecified: Secondary | ICD-10-CM

## 2024-01-06 MED ORDER — PEGFILGRASTIM 6 MG/0.6ML ~~LOC~~ PSKT
6.0000 mg | PREFILLED_SYRINGE | Freq: Once | SUBCUTANEOUS | Status: AC
  Administered 2024-01-06: 6 mg via SUBCUTANEOUS
  Filled 2024-01-06: qty 0.6

## 2024-01-06 MED ORDER — SODIUM CHLORIDE 0.9% FLUSH
10.0000 mL | INTRAVENOUS | Status: DC | PRN
Start: 2024-01-06 — End: 2024-01-06
  Administered 2024-01-06: 10 mL

## 2024-01-06 NOTE — Patient Instructions (Signed)

## 2024-01-07 ENCOUNTER — Ambulatory Visit

## 2024-01-07 DIAGNOSIS — D696 Thrombocytopenia, unspecified: Secondary | ICD-10-CM

## 2024-01-07 DIAGNOSIS — Z5111 Encounter for antineoplastic chemotherapy: Secondary | ICD-10-CM | POA: Diagnosis not present

## 2024-01-07 MED ORDER — ACETAMINOPHEN 325 MG PO TABS
650.0000 mg | ORAL_TABLET | Freq: Once | ORAL | Status: AC
  Administered 2024-01-07: 650 mg via ORAL
  Filled 2024-01-07: qty 2

## 2024-01-07 MED ORDER — SODIUM CHLORIDE 0.9% IV SOLUTION
250.0000 mL | INTRAVENOUS | Status: DC
  Administered 2024-01-07: 100 mL via INTRAVENOUS

## 2024-01-07 MED ORDER — DIPHENHYDRAMINE HCL 25 MG PO CAPS
25.0000 mg | ORAL_CAPSULE | Freq: Once | ORAL | Status: AC
  Administered 2024-01-07: 25 mg via ORAL
  Filled 2024-01-07: qty 1

## 2024-01-07 NOTE — Patient Instructions (Addendum)
 Platelet Transfusion A platelet transfusion is a procedure in which a person receives donated platelets through an IV. Platelets are parts of blood that stick together and form a clot to help the body stop bleeding after an injury. If you have too few platelets, your blood may have trouble clotting. This may cause you to bleed and bruise very easily. You may need a platelet transfusion if you have a condition that causes a low number of platelets (thrombocytopenia). A platelet transfusion may be used to stop or prevent excessive bleeding. Tell a health care provider about: Any reactions you have had during previous transfusions. Any allergies you have. All medicines you are taking, including vitamins, herbs, eye drops, creams, and over-the-counter medicines. Any bleeding problems you have. Any surgeries you have had. Any medical conditions you have. Whether you are pregnant or may be pregnant. What are the risks? Generally, this is a safe procedure. However, problems may occur, including: Fever. Infection. Allergic reaction to the donated (donor) platelets. Your body's disease-fighting system (immune system) attacking the donor platelets (hemolytic reaction). This is rare. A rare reaction that causes lung damage (transfusion-related acute lung injury). What happens before the procedure? Medicines Ask your health care provider about: Changing or stopping your regular medicines. This is especially important if you are taking diabetes medicines or blood thinners. Taking medicines such as aspirin and ibuprofen. These medicines can thin your blood. Do not take these medicines unless your health care provider tells you to take them. Taking over-the-counter medicines, vitamins, herbs, and supplements. General instructions You will have a blood test to determine your blood type. Your blood type determines what kind of platelets you will be given. Follow instructions from your health care provider  about eating or drinking restrictions. If you have had an allergic reaction to a transfusion in the past, you may be given medicine to help prevent a reaction. Your temperature, blood pressure, pulse, and breathing will be monitored. What happens during the procedure?  An IV will be inserted into one of your veins. For your safety, two health care providers will verify your identity along with the donor platelets about to be infused. A bag of donor platelets will be connected to your IV. The platelets will flow into your bloodstream. This usually takes 30-60 minutes. Your temperature, blood pressure, pulse, and breathing will be monitored during the transfusion. This helps detect early signs of any reaction. You will also be monitored for other symptoms that may indicate a reaction, including chills, hives, or itching. If you have signs of a reaction at any time, your transfusion will be stopped, and you may be given medicine to help manage the reaction. When your transfusion is complete, your IV will be removed. Pressure may be applied to the IV site for a few minutes to stop any bleeding. The IV site will be covered with a bandage (dressing). The procedure may vary among health care providers and hospitals. What can I expect after the procedure? Your blood pressure, temperature, pulse, and breathing will be monitored until you leave the hospital or clinic. You may have some bruising and soreness at your IV site. Follow these instructions at home: Medicines Take over-the-counter and prescription medicines only as told by your health care provider. Talk with your health care provider before you take any medicines that contain aspirin or NSAIDs, such as ibuprofen. These medicines increase your risk for dangerous bleeding. IV site care Check your IV site every day for signs of infection. Check for:  Redness, swelling, or pain. Fluid or blood. If fluid or blood drains from your IV site, use your  hands to press down firmly on a bandage covering the area for a minute or two. Doing this should stop the bleeding. Warmth. Pus or a bad smell. General instructions Change or remove your dressing as told by your health care provider. Return to your normal activities as told by your health care provider. Ask your health care provider what activities are safe for you. Do not take baths, swim, or use a hot tub until your health care provider approves. Ask your health care provider if you may take showers. Keep all follow-up visits. This is important. Contact a health care provider if: You have a headache that does not go away with medicine. You have hives, rash, or itchy skin. You have nausea or vomiting. You feel unusually tired or weak. You have signs of infection at your IV site. Get help right away if: You have a fever or chills. You urinate less often than usual. Your urine is darker colored than normal. You have any of the following: Trouble breathing. Pain in your back, abdomen, or chest. Cool, clammy skin. A fast heartbeat. Summary Platelets are tiny pieces of blood cells that clump together to form a blood clot when you have an injury. If you have too few platelets, your blood may have trouble clotting. A platelet transfusion is a procedure in which you receive donated platelets through an IV. A platelet transfusion may be used to stop or prevent excessive bleeding. After the procedure, check your IV site every day for signs of infection. This information is not intended to replace advice given to you by your health care provider. Make sure you discuss any questions you have with your health care provider. Document Revised: 12/17/2022 Document Reviewed: 11/21/2020 Elsevier Patient Education  2024 [employer].

## 2024-01-10 LAB — BPAM PLATELET PHERESIS
Blood Product Expiration Date: 202508072359
Blood Product Expiration Date: 202508082359
ISSUE DATE / TIME: 202508070757
ISSUE DATE / TIME: 202508080856
Unit Type and Rh: 6200
Unit Type and Rh: 7300

## 2024-01-10 LAB — PREPARE PLATELET PHERESIS
Unit division: 0
Unit division: 0

## 2024-01-14 ENCOUNTER — Ambulatory Visit
Admission: RE | Admit: 2024-01-14 | Discharge: 2024-01-14 | Disposition: A | Discharge status: Home / Self Care | Source: Ambulatory Visit | Attending: Family | Admitting: Family

## 2024-01-14 DIAGNOSIS — Z1231 Encounter for screening mammogram for malignant neoplasm of breast: Secondary | ICD-10-CM

## 2024-01-20 ENCOUNTER — Other Ambulatory Visit: Payer: Self-pay | Admitting: Psychiatry

## 2024-01-20 DIAGNOSIS — F422 Mixed obsessional thoughts and acts: Secondary | ICD-10-CM

## 2024-01-24 ENCOUNTER — Encounter: Payer: Self-pay | Admitting: Oncology

## 2024-01-25 NOTE — Progress Notes (Unsigned)
 [facility]  [address] [place],  [place]  [address] [phone]  Clinic Day:  01/04/2024  Referring physician: Glenford Harlene SAILOR, NP  HISTORY OF PRESENT ILLNESS:  The patient is a 58 y.o. female with metastatic colon cancer, which includes spread of disease to her abdominal cavity, lungs, brain, liver, and right adrenal gland.  She comes in today to be evaluated before heading into her 29th cycle of palliative chemotherapy, which consists of FOLFOX/Avastin .  Since her last visit, the patient has been doing fairly well.  She still has intermittent constipation.  However, she denies having any abdominal pain at this time or other new GI symptoms/findings which concern her for overt signs of disease progression.    With respect to her colon cancer history, she is status post a right hemicolectomy in early October 2018, followed by 12 cycles of adjuvant FOLFOX chemotherapy, which were completed in April 2019.  In December 2021, she underwent a left cerebellar metastasectomy, whose pathology was consistent with metastatic colon cancer.  Tumor testing did come back MMR normal. CT scans revealed evidence of her cancer being in multiple locations, for which she took 40 cycles of FOLFIRI/Avastin .  As follow-up scans revealed liver metastasis, she was placed back on FOLFOX/Avastin , for which she continues to take.  PHYSICAL EXAM:  Last menstrual period 02/22/2019. There is no height or weight on file to calculate BMI.  Performance status (ECOG): 1 - Symptomatic but completely ambulatory  Physical Exam Vitals and nursing note reviewed.  Constitutional:      General: She is not in acute distress.    Appearance: Normal appearance.  HENT:     Head: Normocephalic and atraumatic.     Mouth/Throat:     Mouth: Mucous membranes are moist.     Pharynx: Oropharynx is clear. No oropharyngeal exudate or posterior oropharyngeal erythema.  Eyes:     General: No scleral  icterus.    Extraocular Movements: Extraocular movements intact.     Conjunctiva/sclera: Conjunctivae normal.     Pupils: Pupils are equal, round, and reactive to light.  Cardiovascular:     Rate and Rhythm: Normal rate and regular rhythm.     Heart sounds: Normal heart sounds. No murmur heard.    No friction rub. No gallop.  Pulmonary:     Effort: Pulmonary effort is normal.     Breath sounds: Normal breath sounds. No wheezing, rhonchi or rales.  Abdominal:     General: There is no distension.     Palpations: Abdomen is soft. There is no hepatomegaly, splenomegaly or mass.     Tenderness: There is no abdominal tenderness.  Musculoskeletal:        General: Normal range of motion.     Cervical back: Normal range of motion and neck supple. No tenderness.     Right lower leg: No edema.     Left lower leg: No edema.  Lymphadenopathy:     Cervical: No cervical adenopathy.     Upper Body:     Right upper body: No supraclavicular or axillary adenopathy.     Left upper body: No supraclavicular or axillary adenopathy.     Lower Body: No right inguinal adenopathy. No left inguinal adenopathy.  Skin:    General: Skin is warm and dry.     Coloration: Skin is not jaundiced.     Findings: No rash.  Neurological:     Mental Status: She is alert and oriented to person, place, and  time.     Cranial Nerves: No cranial nerve deficit.  Psychiatric:        Mood and Affect: Mood normal.        Behavior: Behavior normal.        Thought Content: Thought content normal.    LABS:      Latest Ref Rng & Units 01/04/2024    9:53 AM 12/15/2023    9:46 AM 11/05/2023    3:20 PM  CBC  WBC 4.0 - 10.5 K/uL 4.0  4.9  3.2   Hemoglobin 12.0 - 15.0 g/dL 88.8  88.0  87.3   Hematocrit 36.0 - 46.0 % 34.3  37.3  38.7   Platelets 150 - 400 K/uL 34  50  32       Latest Ref Rng & Units 01/04/2024    9:53 AM 12/15/2023    9:46 AM 11/03/2023   10:55 AM  CMP  Glucose 70 - 99 mg/dL 698  771  873   BUN 6 - 20 mg/dL 14   13  17    Creatinine 0.44 - 1.00 mg/dL 8.73  8.67  8.53   Sodium 135 - 145 mmol/L 139  140  141   Potassium 3.5 - 5.1 mmol/L 4.3  4.5  4.2   Chloride 98 - 111 mmol/L 102  100  103   CO2 22 - 32 mmol/L 27  28  26    Calcium  8.9 - 10.3 mg/dL 9.3  9.5  9.8   Total Protein 6.5 - 8.1 g/dL 6.0  6.5  6.5   Total Bilirubin 0.0 - 1.2 mg/dL 0.4  0.4  0.5   Alkaline Phos 38 - 126 U/L 338  297  227   AST 15 - 41 U/L 39  38  31   ALT 0 - 44 U/L 31  34  18    ASSESSMENT & PLAN:  Assessment/Plan:  A 58 y.o. female with metastatic colon cancer.  She will proceed her 28th cycle of FOLFOX/Avastin  today. 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  Based upon this, I would consider giving this patient oral therapy which blocks MET amplification in the future if she has no other viable options with respect to her treatment.  I would also consider having her enroll in clinical trial locally if studies involving such agents are able in the area.  With respect to her chemotherapy-induced thrombocytopenia, I will arrange for her to receive 1 unit of platelets before the end of the week.  Otherwise, as she is doing well, I will see her back in 3 weeks before she heads into her 29th cycle of FOLFOX/Avastin .  The patient understands all the plans discussed today and is in agreement with them.  [name] DELENA Kerns, MD

## 2024-01-26 ENCOUNTER — Other Ambulatory Visit: Payer: Self-pay

## 2024-01-26 ENCOUNTER — Encounter: Payer: Self-pay | Admitting: Oncology

## 2024-01-26 ENCOUNTER — Inpatient Hospital Stay

## 2024-01-26 ENCOUNTER — Inpatient Hospital Stay (HOSPITAL_BASED_OUTPATIENT_CLINIC_OR_DEPARTMENT_OTHER): Admitting: Oncology

## 2024-01-26 VITALS — BP 129/78 | HR 94 | Temp 98.1°F | Resp 14

## 2024-01-26 VITALS — BP 129/78 | HR 94 | Temp 98.1°F | Resp 14 | Ht 68.0 in | Wt 145.7 lb

## 2024-01-26 DIAGNOSIS — Z5111 Encounter for antineoplastic chemotherapy: Secondary | ICD-10-CM | POA: Diagnosis not present

## 2024-01-26 DIAGNOSIS — C184 Malignant neoplasm of transverse colon: Secondary | ICD-10-CM

## 2024-01-26 DIAGNOSIS — C189 Malignant neoplasm of colon, unspecified: Secondary | ICD-10-CM

## 2024-01-26 DIAGNOSIS — C182 Malignant neoplasm of ascending colon: Secondary | ICD-10-CM

## 2024-01-26 DIAGNOSIS — D702 Other drug-induced agranulocytosis: Secondary | ICD-10-CM

## 2024-01-26 DIAGNOSIS — C19 Malignant neoplasm of rectosigmoid junction: Secondary | ICD-10-CM | POA: Diagnosis not present

## 2024-01-26 LAB — CMP (CANCER CENTER ONLY)
ALT: 37 U/L (ref 0–44)
AST: 44 U/L — ABNORMAL HIGH (ref 15–41)
Albumin: 3.4 g/dL — ABNORMAL LOW (ref 3.5–5.0)
Alkaline Phosphatase: 306 U/L — ABNORMAL HIGH (ref 38–126)
Anion gap: 10 (ref 5–15)
BUN: 16 mg/dL (ref 6–20)
CO2: 28 mmol/L (ref 22–32)
Calcium: 9.4 mg/dL (ref 8.9–10.3)
Chloride: 105 mmol/L (ref 98–111)
Creatinine: 1.14 mg/dL — ABNORMAL HIGH (ref 0.44–1.00)
GFR, Estimated: 56 mL/min — ABNORMAL LOW (ref >60)
Glucose, Bld: 100 mg/dL — ABNORMAL HIGH (ref 70–99)
Potassium: 4.7 mmol/L (ref 3.5–5.1)
Sodium: 143 mmol/L (ref 135–145)
Total Bilirubin: 0.3 mg/dL (ref 0.0–1.2)
Total Protein: 6 g/dL — ABNORMAL LOW (ref 6.5–8.1)

## 2024-01-26 LAB — CBC WITH DIFFERENTIAL (CANCER CENTER ONLY)
Abs Immature Granulocytes: 0.02 K/uL (ref 0.00–0.07)
Basophils Absolute: 0 K/uL (ref 0.0–0.1)
Basophils Relative: 0 %
Eosinophils Absolute: 0.1 K/uL (ref 0.0–0.5)
Eosinophils Relative: 1 %
HCT: 34.5 % — ABNORMAL LOW (ref 36.0–46.0)
Hemoglobin: 10.9 g/dL — ABNORMAL LOW (ref 12.0–15.0)
Immature Granulocytes: 0 %
Lymphocytes Relative: 20 %
Lymphs Abs: 0.9 K/uL (ref 0.7–4.0)
MCH: 32.7 pg (ref 26.0–34.0)
MCHC: 31.6 g/dL (ref 30.0–36.0)
MCV: 103.6 fL — ABNORMAL HIGH (ref 80.0–100.0)
Monocytes Absolute: 0.6 K/uL (ref 0.1–1.0)
Monocytes Relative: 13 %
Neutro Abs: 3 K/uL (ref 1.7–7.7)
Neutrophils Relative %: 66 %
Platelet Count: 44 K/uL — ABNORMAL LOW (ref 150–400)
RBC: 3.33 MIL/uL — ABNORMAL LOW (ref 3.87–5.11)
RDW: 15.7 % — ABNORMAL HIGH (ref 11.5–15.5)
WBC Count: 4.6 K/uL (ref 4.0–10.5)
nRBC: 0 % (ref 0.0–0.2)

## 2024-01-26 MED ORDER — HEPARIN SOD (PORK) LOCK FLUSH 100 UNIT/ML IV SOLN: 250.0000 [IU] | Freq: Once | INTRAVENOUS | Status: DC | PRN

## 2024-01-26 MED ORDER — ALTEPLASE 2 MG IJ SOLR: 2.0000 mg | Freq: Once | INTRAMUSCULAR | Status: DC | PRN

## 2024-01-26 MED ORDER — FLUOROURACIL CHEMO INJECTION 2.5 GM/50ML
400.0000 mg/m2 | Freq: Once | INTRAVENOUS | Status: AC
  Administered 2024-01-26: 700 mg via INTRAVENOUS
  Filled 2024-01-26: qty 14

## 2024-01-26 MED ORDER — SODIUM CHLORIDE 0.9 % IV SOLN: Freq: Once | INTRAVENOUS | Status: AC

## 2024-01-26 MED ORDER — DEXTROSE 5 % IV SOLN: Freq: Once | INTRAVENOUS | Status: AC

## 2024-01-26 MED ORDER — DIPHENHYDRAMINE HCL 50 MG/ML IJ SOLN
25.0000 mg | Freq: Once | INTRAMUSCULAR | Status: AC
  Administered 2024-01-26: 25 mg via INTRAVENOUS
  Filled 2024-01-26: qty 1

## 2024-01-26 MED ORDER — SODIUM CHLORIDE 0.9% FLUSH: 3.0000 mL | INTRAVENOUS | Status: DC | PRN

## 2024-01-26 MED ORDER — OXALIPLATIN CHEMO INJECTION 50 MG/10ML
63.7500 mg/m2 | Freq: Once | INTRAVENOUS | Status: AC
  Administered 2024-01-26: 115 mg via INTRAVENOUS
  Filled 2024-01-26: qty 20

## 2024-01-26 MED ORDER — SODIUM CHLORIDE 0.9% FLUSH: 10.0000 mL | INTRAVENOUS | Status: DC | PRN

## 2024-01-26 MED ORDER — SODIUM CHLORIDE 0.9 % IV SOLN
5.0000 mg/kg | Freq: Once | INTRAVENOUS | Status: AC
  Administered 2024-01-26: 350 mg via INTRAVENOUS
  Filled 2024-01-26: qty 14

## 2024-01-26 MED ORDER — PALONOSETRON HCL INJECTION 0.25 MG/5ML
0.2500 mg | Freq: Once | INTRAVENOUS | Status: AC
  Administered 2024-01-26: 0.25 mg via INTRAVENOUS
  Filled 2024-01-26: qty 5

## 2024-01-26 MED ORDER — DEXTROSE 5 % IV SOLN: Freq: Once | INTRAVENOUS | Status: DC

## 2024-01-26 MED ORDER — FAMOTIDINE IN NACL 20-0.9 MG/50ML-% IV SOLN
20.0000 mg | Freq: Once | INTRAVENOUS | Status: AC
  Administered 2024-01-26: 20 mg via INTRAVENOUS
  Filled 2024-01-26: qty 50

## 2024-01-26 MED ORDER — SODIUM CHLORIDE 0.9 % IV SOLN
2400.0000 mg/m2 | INTRAVENOUS | Status: DC
  Administered 2024-01-26: 4350 mg via INTRAVENOUS
  Filled 2024-01-26: qty 87

## 2024-01-26 MED ORDER — DEXAMETHASONE SODIUM PHOSPHATE 10 MG/ML IJ SOLN
10.0000 mg | Freq: Once | INTRAMUSCULAR | Status: AC
  Administered 2024-01-26: 10 mg via INTRAVENOUS
  Filled 2024-01-26: qty 1

## 2024-01-26 MED ORDER — SODIUM CHLORIDE 0.9 % IV SOLN
150.0000 mg | Freq: Once | INTRAVENOUS | Status: AC
  Administered 2024-01-26: 150 mg via INTRAVENOUS
  Filled 2024-01-26: qty 150

## 2024-01-26 MED ORDER — HEPARIN SOD (PORK) LOCK FLUSH 100 UNIT/ML IV SOLN: 500.0000 [IU] | Freq: Once | INTRAVENOUS | Status: DC | PRN

## 2024-01-26 NOTE — Patient Instructions (Signed)
 The chemotherapy medication bag should finish at 46 hours. For example, if your pump is scheduled for 46 hours and it was put on at 4:00 p.m., it should finish at 2:00 p.m. the day it is scheduled to come off regardless of your appointment time.     Estimated time to finish at 3:30 .   If the display on your pump reads Low Volume and it is beeping, take the batteries out of the pump and come to the cancer center for it to be taken off.   If the pump alarms go off prior to the pump reading Low Volume then call [phone] and someone can assist you.  If the plunger comes out and the chemotherapy medication is leaking out, please use your home chemo spill kit to clean up the spill. Do NOT use paper towels or other household products.  If you have problems or questions regarding your pump, please call either [phone] (24 hours a day) or the cancer center Monday-Friday 8:00 a.m.- 4:30 p.m. at the clinic number and we will assist you. If you are unable to get assistance, then go to the nearest Emergency Department and ask the staff to contact the IV team for assistance.

## 2024-01-26 NOTE — Progress Notes (Signed)
 Per Dr Ezzard- ok to treat with platelets of 44- will give platelets Friday

## 2024-01-28 ENCOUNTER — Inpatient Hospital Stay

## 2024-01-28 ENCOUNTER — Encounter: Payer: Self-pay | Admitting: Oncology

## 2024-01-28 VITALS — BP 137/83 | HR 2 | Temp 98.2°F | Resp 14

## 2024-01-28 DIAGNOSIS — R110 Nausea: Secondary | ICD-10-CM

## 2024-01-28 DIAGNOSIS — D702 Other drug-induced agranulocytosis: Secondary | ICD-10-CM

## 2024-01-28 DIAGNOSIS — C182 Malignant neoplasm of ascending colon: Secondary | ICD-10-CM

## 2024-01-28 DIAGNOSIS — E860 Dehydration: Secondary | ICD-10-CM

## 2024-01-28 DIAGNOSIS — Z5111 Encounter for antineoplastic chemotherapy: Secondary | ICD-10-CM | POA: Diagnosis not present

## 2024-01-28 DIAGNOSIS — C189 Malignant neoplasm of colon, unspecified: Secondary | ICD-10-CM

## 2024-01-28 DIAGNOSIS — C787 Secondary malignant neoplasm of liver and intrahepatic bile duct: Secondary | ICD-10-CM

## 2024-01-28 MED ORDER — SODIUM CHLORIDE 0.9% FLUSH: 3.0000 mL | INTRAVENOUS | Status: DC | PRN

## 2024-01-28 MED ORDER — LORAZEPAM 2 MG/ML IJ SOLN
1.0000 mg | Freq: Once | INTRAMUSCULAR | Status: AC
  Administered 2024-01-28: 1 mg via INTRAVENOUS
  Filled 2024-01-28: qty 1

## 2024-01-28 MED ORDER — DEXAMETHASONE SODIUM PHOSPHATE 10 MG/ML IJ SOLN
10.0000 mg | Freq: Once | INTRAMUSCULAR | Status: AC
  Administered 2024-01-28: 10 mg via INTRAVENOUS
  Filled 2024-01-28: qty 1

## 2024-01-28 MED ORDER — SODIUM CHLORIDE 0.9% IV SOLUTION
250.0000 mL | INTRAVENOUS | Status: DC
  Administered 2024-01-28: 250 mL via INTRAVENOUS

## 2024-01-28 MED ORDER — SODIUM CHLORIDE 0.9% FLUSH: 10.0000 mL | INTRAVENOUS | Status: DC | PRN

## 2024-01-28 MED ORDER — SODIUM CHLORIDE 0.9 % IV SOLN: Freq: Once | INTRAVENOUS | Status: AC

## 2024-01-28 MED ORDER — PEGFILGRASTIM 6 MG/0.6ML ~~LOC~~ PSKT
6.0000 mg | PREFILLED_SYRINGE | Freq: Once | SUBCUTANEOUS | Status: AC
  Administered 2024-01-28: 6 mg via SUBCUTANEOUS
  Filled 2024-01-28: qty 0.6

## 2024-01-28 NOTE — Patient Instructions (Signed)
 Getting Platelets Through an IV (Platelet Transfusion) in Adults: What to Expect A platelet transfusion is when a person gets platelets from another person (donor) through an IV. Platelets are parts of blood that stick together and form a clot to help stop bleeding after an injury. If you don't have enough platelets, you might bleed or bruise easily. You may need a platelet transfusion if you have a health problem that causes a low number of platelets, such as thrombocytopenia. A platelet transfusion may be used to stop or prevent too much bleeding. Tell a health care provider about: Any reactions you've had during past transfusions. Any allergies you have. All medicines you take. These include vitamins, herbs, eye drops, and creams. Any bleeding problems you have. Any surgeries you've had. Any health problems you have. Whether you're pregnant or may be pregnant. What are the risks? Your health care provider will talk with you about risks. These may include: Fever or chills. This might happen if your body reacts to the new cells. It can happen during or up to 4 hours after the transfusion. A mild allergy. You might get red, swollen areas on your skin (hives). You might feel itchy. A bad allergy. You might have trouble breathing or swelling around your face and lips. Very bad reactions are rare but can happen. These may be: Infection. This is rare because donated blood is carefully tested. Hemolytic reaction. This is when the defense system (immune system) of your body attacks the new cells. Symptoms include fever, chills, and a feeling that you may throw up. You may also have low blood pressure and pain in your chest or lower back. Transfusion-associated circulatory overload (TACO). This happens if fluids move to your lungs. This may cause breathing problems. Transfusion-related acute lung injury (TRALI). TRALI can cause breathing problems and low oxygen in your blood. This can happen within  hours or days after the transfusion. Transfusion-associated graft-versus-host disease (TAGVHD). This happens when donated cells attack the body's healthy tissues. What happens before? Take your medicines only as told. You will have a blood test to find out your blood type. This helps match the donor blood with your blood. If you've had an allergy to a transfusion in the past, you may be given medicine to help prevent another allergy. Your temperature, blood pressure, pulse, and breathing will be checked. What happens during platelet transfusion?  An IV will be put into a vein in your hand or arm. For your safety, two health care team members will check your identity and the donor platelets. The bag of donor platelets will be connected to your IV. The platelets will flow into your blood. This usually takes 30-60 minutes. Your temperature, blood pressure, pulse, and breathing will be checked. This helps catch signs of a reaction early. You will be watched for symptoms of all types of reactions, like chills, hives, or itching. If you show signs of a reaction, the transfusion will be stopped. You may be given medicine to help treat the reaction. When your transfusion is done, your IV will be removed. Pressure may be put on the IV site for a few minutes to stop any bleeding. The IV site will be covered with a bandage. These steps may vary. Ask what you can expect. What happens after? You will be watched closely until you leave. This includes checking your blood pressure, temperature, pulse rate, and breathing rate again. This information is not intended to replace advice given to you by your health care  provider. Make sure you discuss any questions you have with your health care provider. Document Revised: 09/03/2023 Document Reviewed: 09/03/2023 Elsevier Patient Education  2025 Elsevier Inc.Pegfilgrastim  Injection What is this medication? PEGFILGRASTIM  (PEG fil gra stim) lowers the risk of  infection in people who are receiving chemotherapy. It works by Systems analyst make more white blood cells, which protects your body from infection. It may also be used to help people who have been exposed to high doses of radiation. This medicine may be used for other purposes; ask your health care provider or pharmacist if you have questions. COMMON BRAND NAME(S): Fulphila , Fylnetra , Neulasta , Nyvepria , Stimufend , UDENYCA , UDENYCA  ONBODY, Ziextenzo  What should I tell my care team before I take this medication? They need to know if you have any of these conditions: Kidney disease Latex allergy Ongoing radiation therapy Sickle cell disease Skin reactions to acrylic adhesives (On-Body Injector only) An unusual or allergic reaction to pegfilgrastim , filgrastim , other medications, foods, dyes, or preservatives Pregnant or trying to get pregnant Breast-feeding How should I use this medication? This medication is for injection under the skin. If you get this medication at home, you will be taught how to prepare and give the pre-filled syringe or how to use the On-body Injector. Refer to the patient Instructions for Use for detailed instructions. Use exactly as directed. Tell your care team immediately if you suspect that the On-body Injector may not have performed as intended or if you suspect the use of the On-body Injector resulted in a missed or partial dose. It is important that you put your used needles and syringes in a special sharps container. Do not put them in a trash can. If you do not have a sharps container, call your pharmacist or care team to get one. Talk to your care team about the use of this medication in children. While this medication may be prescribed for selected conditions, precautions do apply. Overdosage: If you think you have taken too much of this medicine contact a poison control center or emergency room at once. NOTE: This medicine is only for you. Do not share this  medicine with others. What if I miss a dose? It is important not to miss your dose. Call your care team if you miss your dose. If you miss a dose due to an On-body Injector failure or leakage, a new dose should be administered as soon as possible using a single prefilled syringe for manual use. What may interact with this medication? Interactions have not been studied. This list may not describe all possible interactions. Give your health care provider a list of all the medicines, herbs, non-prescription drugs, or dietary supplements you use. Also tell them if you smoke, drink alcohol, or use illegal drugs. Some items may interact with your medicine. What should I watch for while using this medication? Your condition will be monitored carefully while you are receiving this medication. You may need blood work done while you are taking this medication. Talk to your care team about your risk of cancer. You may be more at risk for certain types of cancer if you take this medication. If you are going to need a MRI, CT scan, or other procedure, tell your care team that you are using this medication (On-Body Injector only). What side effects may I notice from receiving this medication? Side effects that you should report to your care team as soon as possible: Allergic reactions--skin rash, itching, hives, swelling of the face, lips, tongue, or  throat Capillary leak syndrome--stomach or muscle pain, unusual weakness or fatigue, feeling faint or lightheaded, decrease in the amount of urine, swelling of the ankles, hands, or feet, trouble breathing High white blood cell level--fever, fatigue, trouble breathing, night sweats, change in vision, weight loss Inflammation of the aorta--fever, fatigue, back, chest, or stomach pain, severe headache Kidney injury (glomerulonephritis)--decrease in the amount of urine, red or dark brown urine, foamy or bubbly urine, swelling of the ankles, hands, or feet Shortness of  breath or trouble breathing Spleen injury--pain in upper left stomach or shoulder Unusual bruising or bleeding Side effects that usually do not require medical attention (report to your care team if they continue or are bothersome): Bone pain Pain in the hands or feet This list may not describe all possible side effects. Call your doctor for medical advice about side effects. You may report side effects to FDA at 1-800-FDA-1088. Where should I keep my medication? Keep out of the reach of children. If you are using this medication at home, you will be instructed on how to store it. Throw away any unused medication after the expiration date on the label. NOTE: This sheet is a summary. It may not cover all possible information. If you have questions about this medicine, talk to your doctor, pharmacist, or health care provider.  2024 Elsevier/Gold Standard (2021-04-18 00:00:00)

## 2024-02-01 LAB — PREPARE PLATELET PHERESIS: Unit division: 0

## 2024-02-01 LAB — BPAM PLATELET PHERESIS
Blood Product Expiration Date: 202508312359
ISSUE DATE / TIME: 202508290850
Unit Type and Rh: 6200

## 2024-02-02 ENCOUNTER — Other Ambulatory Visit: Payer: Self-pay | Admitting: Psychiatry

## 2024-02-02 ENCOUNTER — Encounter: Payer: Self-pay | Admitting: Oncology

## 2024-02-02 DIAGNOSIS — G4721 Circadian rhythm sleep disorder, delayed sleep phase type: Secondary | ICD-10-CM

## 2024-02-08 ENCOUNTER — Telehealth: Payer: Self-pay | Admitting: Psychiatry

## 2024-02-08 NOTE — Telephone Encounter (Signed)
 Pt called at 4:18p.  She said she's going on Medicare in Dec, but she needs to know before then if there is something else she can take besides the Modafinil  and Auvelity  because the cost for it on Medicare will be too high.  Next appt 11/11

## 2024-02-09 NOTE — Telephone Encounter (Signed)
Recommendations reviewed with patient.

## 2024-02-09 NOTE — Telephone Encounter (Signed)
 There is no cheaper alternative to modafinil .  It can be purchased with [facility] for about $20-25/month.  Re: Auvelity , it is complicated but simplest thing is to switch back to Wellbutrin  but she has an appt with me before the switch, so we should wait until then to make any changes.

## 2024-02-09 NOTE — Telephone Encounter (Signed)
 Pt will have Medicare in Dec and is worried about cost of meds - Modafinil  and Auvelity . She is asking for alternatives to these. Has FU 11/11.    Disc stopping Vraylar  3 mg every other day .  She feels ok about stopping it.  If dep worsens resume it.   Wants to have access to Ambien  but doesn't keep her asleep.  Continue Luvox  to 2 and 1/2 tablets   Better mood and anxiety with DC Wellbutrin  and trial Auvelity  1 Am and then BID for residual depression anhedonia.     If helpful will possibly  need to use generic products.  This was discussed.  But PA approved.   OK continue Modafinil  to 300 mg daily for excessive sleepiness and cancer related fatigue   trazodone  50-100 mg HS Option Lunesta alternative

## 2024-02-10 ENCOUNTER — Other Ambulatory Visit: Payer: Self-pay | Admitting: Oncology

## 2024-02-14 NOTE — Progress Notes (Unsigned)
 [facility] [address] [place],  [place]  [address] [phone]  Clinic Day:  02/18/2024  Referring physician: Glenford Harlene SAILOR, NP  HISTORY OF PRESENT ILLNESS:  The patient is a 58 y.o. female with metastatic colon cancer, which includes spread of disease to her abdominal cavity, lungs, brain, liver, and right adrenal gland.  She comes in today to be evaluated before heading into her 30th cycle of palliative chemotherapy, which consists of FOLFOX/Avastin .  Since her last visit, the patient has been doing fairly well.  She still has intermittent constipation/diarrhea.  She also claims to have fallen recently, for which she had no idea how she fell.  However, she denies having any new GI symptoms/findings which concern her for overt signs of disease progression.    With respect to her colon cancer history, she is status post a right hemicolectomy in early October 2018, followed by 12 cycles of adjuvant FOLFOX chemotherapy, which were completed in April 2019.  In December 2021, she underwent a left cerebellar metastasectomy, whose pathology was consistent with metastatic colon cancer.  Tumor testing did come back MMR normal. CT scans revealed evidence of her cancer being in multiple locations, for which she took 40 cycles of FOLFIRI/Avastin .  As follow-up scans revealed liver metastasis, she was placed back on FOLFOX/Avastin , for which she continues to take.  PHYSICAL EXAM:  Blood pressure 122/80, pulse 91, temperature 98.4 F (36.9 C), temperature source Oral, resp. rate 14, height 5' 8 (1.727 m), weight 142 lb 12.8 oz (64.8 kg), last menstrual period 02/22/2019, SpO2 98%. Body mass index is 21.71 kg/m.  Performance status (ECOG): 1 - Symptomatic but completely ambulatory  Physical Exam Vitals and nursing note reviewed.  Constitutional:      General: She is not in acute distress.    Appearance: Normal appearance.     Comments: Her mentation  appears slightly slower today   HENT:     Head: Normocephalic and atraumatic.     Mouth/Throat:     Mouth: Mucous membranes are moist.     Pharynx: Oropharynx is clear. No oropharyngeal exudate or posterior oropharyngeal erythema.  Eyes:     General: No scleral icterus.    Extraocular Movements: Extraocular movements intact.     Conjunctiva/sclera: Conjunctivae normal.     Pupils: Pupils are equal, round, and reactive to light.  Cardiovascular:     Rate and Rhythm: Normal rate and regular rhythm.     Heart sounds: Normal heart sounds. No murmur heard.    No friction rub. No gallop.  Pulmonary:     Effort: Pulmonary effort is normal.     Breath sounds: Normal breath sounds. No wheezing, rhonchi or rales.  Abdominal:     General: There is no distension.     Palpations: Abdomen is soft. There is no hepatomegaly, splenomegaly or mass.     Tenderness: There is no abdominal tenderness.  Musculoskeletal:        General: Normal range of motion.     Cervical back: Normal range of motion and neck supple. No tenderness.     Right lower leg: No edema.     Left lower leg: No edema.  Lymphadenopathy:     Cervical: No cervical adenopathy.     Upper Body:     Right upper body: No supraclavicular or axillary adenopathy.     Left upper body: No supraclavicular or axillary adenopathy.     Lower Body: No right inguinal adenopathy. No  left inguinal adenopathy.  Skin:    General: Skin is warm and dry.     Coloration: Skin is not jaundiced.     Findings: No rash.  Neurological:     Mental Status: She is alert and oriented to person, place, and time.     Cranial Nerves: No cranial nerve deficit.  Psychiatric:        Mood and Affect: Mood normal.        Behavior: Behavior normal.        Thought Content: Thought content normal.    LABS:      Latest Ref Rng & Units 02/15/2024    9:33 AM 01/26/2024   10:20 AM 01/04/2024    9:53 AM  CBC  WBC 4.0 - 10.5 K/uL 5.1  4.6  4.0   Hemoglobin 12.0 -  15.0 g/dL 89.0  89.0  88.8   Hematocrit 36.0 - 46.0 % 34.4  34.5  34.3   Platelets 150 - 400 K/uL 50  44  34       Latest Ref Rng & Units 02/15/2024    9:33 AM 01/26/2024   10:20 AM 01/04/2024    9:53 AM  CMP  Glucose 70 - 99 mg/dL 882  899  698   BUN 6 - 20 mg/dL 16  16  14    Creatinine 0.44 - 1.00 mg/dL 8.81  8.85  8.73   Sodium 135 - 145 mmol/L 140  143  139   Potassium 3.5 - 5.1 mmol/L 4.8  4.7  4.3   Chloride 98 - 111 mmol/L 103  105  102   CO2 22 - 32 mmol/L 26  28  27    Calcium  8.9 - 10.3 mg/dL 9.0  9.4  9.3   Total Protein 6.5 - 8.1 g/dL 6.1  6.0  6.0   Total Bilirubin 0.0 - 1.2 mg/dL 0.4  0.3  0.4   Alkaline Phos 38 - 126 U/L 302  306  338   AST 15 - 41 U/L 45  44  39   ALT 0 - 44 U/L 30  37  31    ASSESSMENT & PLAN:  Assessment/Plan:  A 58 y.o. female with metastatic colon cancer.  She will proceed her 30th cycle of FOLFOX/Avastin  today.  Clinically, the patient is doing okay.  Her mentation does appear slightly slower versus previous visits.  She is scheduled for brain MRI in February 2026.  I did ask her if she preferred to have 1 done sooner, for which she declined.  I will see her back in 3 weeks before she heads into her 31st cycle of FOLFOX/Avastin .  CT scans of her chest/abdomen/pelvis will be done today before her next visit to ascertain her new disease baseline after 30 cycles of palliative chemotherapy.  The patient understands all the plans discussed today and is in agreement with them.  Calianna Kim DELENA Kerns, MD

## 2024-02-15 ENCOUNTER — Other Ambulatory Visit: Payer: Self-pay | Admitting: Oncology

## 2024-02-15 ENCOUNTER — Inpatient Hospital Stay

## 2024-02-15 ENCOUNTER — Inpatient Hospital Stay: Attending: Oncology

## 2024-02-15 ENCOUNTER — Encounter: Payer: Self-pay | Admitting: Oncology

## 2024-02-15 ENCOUNTER — Inpatient Hospital Stay (HOSPITAL_BASED_OUTPATIENT_CLINIC_OR_DEPARTMENT_OTHER): Admitting: Oncology

## 2024-02-15 VITALS — BP 122/80 | HR 91 | Temp 98.4°F | Resp 14 | Ht 68.0 in | Wt 142.8 lb

## 2024-02-15 DIAGNOSIS — C7931 Secondary malignant neoplasm of brain: Secondary | ICD-10-CM | POA: Diagnosis not present

## 2024-02-15 DIAGNOSIS — Z5189 Encounter for other specified aftercare: Secondary | ICD-10-CM | POA: Diagnosis not present

## 2024-02-15 DIAGNOSIS — C7971 Secondary malignant neoplasm of right adrenal gland: Secondary | ICD-10-CM | POA: Diagnosis not present

## 2024-02-15 DIAGNOSIS — C19 Malignant neoplasm of rectosigmoid junction: Secondary | ICD-10-CM | POA: Diagnosis not present

## 2024-02-15 DIAGNOSIS — C787 Secondary malignant neoplasm of liver and intrahepatic bile duct: Secondary | ICD-10-CM | POA: Diagnosis not present

## 2024-02-15 DIAGNOSIS — C189 Malignant neoplasm of colon, unspecified: Secondary | ICD-10-CM

## 2024-02-15 DIAGNOSIS — C182 Malignant neoplasm of ascending colon: Secondary | ICD-10-CM

## 2024-02-15 DIAGNOSIS — Z5111 Encounter for antineoplastic chemotherapy: Secondary | ICD-10-CM | POA: Insufficient documentation

## 2024-02-15 DIAGNOSIS — C7800 Secondary malignant neoplasm of unspecified lung: Secondary | ICD-10-CM

## 2024-02-15 DIAGNOSIS — C786 Secondary malignant neoplasm of retroperitoneum and peritoneum: Secondary | ICD-10-CM | POA: Insufficient documentation

## 2024-02-15 DIAGNOSIS — C7802 Secondary malignant neoplasm of left lung: Secondary | ICD-10-CM | POA: Diagnosis not present

## 2024-02-15 DIAGNOSIS — C7801 Secondary malignant neoplasm of right lung: Secondary | ICD-10-CM | POA: Insufficient documentation

## 2024-02-15 LAB — CBC WITH DIFFERENTIAL (CANCER CENTER ONLY)
Abs Immature Granulocytes: 0.02 K/uL (ref 0.00–0.07)
Basophils Absolute: 0 K/uL (ref 0.0–0.1)
Basophils Relative: 0 %
Eosinophils Absolute: 0.1 K/uL (ref 0.0–0.5)
Eosinophils Relative: 1 %
HCT: 34.4 % — ABNORMAL LOW (ref 36.0–46.0)
Hemoglobin: 10.9 g/dL — ABNORMAL LOW (ref 12.0–15.0)
Immature Granulocytes: 0 %
Lymphocytes Relative: 19 %
Lymphs Abs: 1 K/uL (ref 0.7–4.0)
MCH: 32.4 pg (ref 26.0–34.0)
MCHC: 31.7 g/dL (ref 30.0–36.0)
MCV: 102.4 fL — ABNORMAL HIGH (ref 80.0–100.0)
Monocytes Absolute: 0.6 K/uL (ref 0.1–1.0)
Monocytes Relative: 12 %
Neutro Abs: 3.5 K/uL (ref 1.7–7.7)
Neutrophils Relative %: 68 %
Platelet Count: 50 K/uL — ABNORMAL LOW (ref 150–400)
RBC: 3.36 MIL/uL — ABNORMAL LOW (ref 3.87–5.11)
RDW: 15.3 % (ref 11.5–15.5)
WBC Count: 5.1 K/uL (ref 4.0–10.5)
nRBC: 0 % (ref 0.0–0.2)

## 2024-02-15 LAB — CMP (CANCER CENTER ONLY)
ALT: 30 U/L (ref 0–44)
AST: 45 U/L — ABNORMAL HIGH (ref 15–41)
Albumin: 3.2 g/dL — ABNORMAL LOW (ref 3.5–5.0)
Alkaline Phosphatase: 302 U/L — ABNORMAL HIGH (ref 38–126)
Anion gap: 12 (ref 5–15)
BUN: 16 mg/dL (ref 6–20)
CO2: 26 mmol/L (ref 22–32)
Calcium: 9 mg/dL (ref 8.9–10.3)
Chloride: 103 mmol/L (ref 98–111)
Creatinine: 1.18 mg/dL — ABNORMAL HIGH (ref 0.44–1.00)
GFR, Estimated: 54 mL/min — ABNORMAL LOW (ref >60)
Glucose, Bld: 117 mg/dL — ABNORMAL HIGH (ref 70–99)
Potassium: 4.8 mmol/L (ref 3.5–5.1)
Sodium: 140 mmol/L (ref 135–145)
Total Bilirubin: 0.4 mg/dL (ref 0.0–1.2)
Total Protein: 6.1 g/dL — ABNORMAL LOW (ref 6.5–8.1)

## 2024-02-15 LAB — TOTAL PROTEIN, URINE DIPSTICK: Protein, ur: NEGATIVE mg/dL

## 2024-02-15 MED ORDER — DEXTROSE 5 % IV SOLN: Freq: Once | INTRAVENOUS | Status: AC

## 2024-02-15 MED ORDER — FLUOROURACIL CHEMO INJECTION 2.5 GM/50ML
400.0000 mg/m2 | Freq: Once | INTRAVENOUS | Status: AC
  Administered 2024-02-15: 700 mg via INTRAVENOUS
  Filled 2024-02-15: qty 14

## 2024-02-15 MED ORDER — OXALIPLATIN CHEMO INJECTION 50 MG/10ML
63.7500 mg/m2 | Freq: Once | INTRAVENOUS | Status: AC
  Administered 2024-02-15: 115 mg via INTRAVENOUS
  Filled 2024-02-15: qty 20

## 2024-02-15 MED ORDER — FAMOTIDINE IN NACL 20-0.9 MG/50ML-% IV SOLN
20.0000 mg | Freq: Once | INTRAVENOUS | Status: AC
  Administered 2024-02-15: 20 mg via INTRAVENOUS
  Filled 2024-02-15: qty 50

## 2024-02-15 MED ORDER — SODIUM CHLORIDE 0.9 % IV SOLN
5.0000 mg/kg | Freq: Once | INTRAVENOUS | Status: AC
  Administered 2024-02-15: 350 mg via INTRAVENOUS
  Filled 2024-02-15: qty 14

## 2024-02-15 MED ORDER — SODIUM CHLORIDE 0.9 % IV SOLN
2400.0000 mg/m2 | INTRAVENOUS | Status: DC
  Administered 2024-02-15: 4350 mg via INTRAVENOUS
  Filled 2024-02-15: qty 87

## 2024-02-15 MED ORDER — SODIUM CHLORIDE 0.9 % IV SOLN
150.0000 mg | Freq: Once | INTRAVENOUS | Status: AC
  Administered 2024-02-15: 150 mg via INTRAVENOUS
  Filled 2024-02-15: qty 150

## 2024-02-15 MED ORDER — PALONOSETRON HCL INJECTION 0.25 MG/5ML
0.2500 mg | Freq: Once | INTRAVENOUS | Status: AC
  Administered 2024-02-15: 0.25 mg via INTRAVENOUS
  Filled 2024-02-15: qty 5

## 2024-02-15 MED ORDER — DIPHENHYDRAMINE HCL 50 MG/ML IJ SOLN
25.0000 mg | Freq: Once | INTRAMUSCULAR | Status: AC
  Administered 2024-02-15: 25 mg via INTRAVENOUS
  Filled 2024-02-15: qty 1

## 2024-02-15 MED ORDER — SODIUM CHLORIDE 0.9 % IV SOLN: Freq: Once | INTRAVENOUS | Status: AC

## 2024-02-15 MED ORDER — DEXAMETHASONE SODIUM PHOSPHATE 10 MG/ML IJ SOLN
10.0000 mg | Freq: Once | INTRAMUSCULAR | Status: AC
  Administered 2024-02-15: 10 mg via INTRAVENOUS
  Filled 2024-02-15: qty 1

## 2024-02-15 NOTE — Progress Notes (Signed)
 Patient is okay to have treatment with Platelets of 50, per Dr. Ezzard.

## 2024-02-15 NOTE — Patient Instructions (Signed)
 Bevacizumab  Injection What is this medication? BEVACIZUMAB  (be va SIZ yoo mab) treats some types of cancer. It works by blocking a protein that causes cancer cells to grow and multiply. This helps to slow or stop the spread of cancer cells. It is a monoclonal antibody. This medicine may be used for other purposes; ask your health care provider or pharmacist if you have questions. COMMON BRAND NAME(S): Alymsys , Avastin , MVASI , Vegzalma, Zirabev  What should I tell my care team before I take this medication? They need to know if you have any of these conditions: Blood clots Coughing up blood Having or recent surgery Heart failure High blood pressure History of a connection between 2 or more body parts that do not usually connect (fistula) History of a tear in your stomach or intestines Protein in your urine An unusual or allergic reaction to bevacizumab , other medications, foods, dyes, or preservatives Pregnant or trying to get pregnant Breast-feeding How should I use this medication? This medication is injected into a vein. It is given by your care team in a hospital or clinic setting. Talk to your care team the use of this medication in children. Special care may be needed. Overdosage: If you think you have taken too much of this medicine contact a poison control center or emergency room at once. NOTE: This medicine is only for you. Do not share this medicine with others. What if I miss a dose? Keep appointments for follow-up doses. It is important not to miss your dose. Call your care team if you are unable to keep an appointment. What may interact with this medication? Interactions are not expected. This list may not describe all possible interactions. Give your health care provider a list of all the medicines, herbs, non-prescription drugs, or dietary supplements you use. Also tell them if you smoke, drink alcohol, or use illegal drugs. Some items may interact with your medicine. What  should I watch for while using this medication? Your condition will be monitored carefully while you are receiving this medication. You may need blood work while taking this medication. This medication may make you feel generally unwell. This is not uncommon as chemotherapy can affect healthy cells as well as cancer cells. Report any side effects. Continue your course of treatment even though you feel ill unless your care team tells you to stop. This medication may increase your risk to bruise or bleed. Call your care team if you notice any unusual bleeding. Before having surgery, talk to your care team to make sure it is ok. This medication can increase the risk of poor healing of your surgical site or wound. You will need to stop this medication for 28 days before surgery. After surgery, wait at least 28 days before restarting this medication. Make sure the surgical site or wound is healed enough before restarting this medication. Talk to your care team if questions. Talk to your care team if you may be pregnant. Serious birth defects can occur if you take this medication during pregnancy and for 6 months after the last dose. Contraception is recommended while taking this medication and for 6 months after the last dose. Your care team can help you find the option that works for you. Do not breastfeed while taking this medication and for 6 months after the last dose. This medication can cause infertility. Talk to your care team if you are concerned about your fertility. What side effects may I notice from receiving this medication? Side effects that you should  report to your care team as soon as possible: Allergic reactions--skin rash, itching, hives, swelling of the face, lips, tongue, or throat Bleeding--bloody or black, tar-like stools, vomiting blood or brown material that looks like coffee grounds, red or dark brown urine, small red or purple spots on skin, unusual bruising or bleeding Blood  clot--pain, swelling, or warmth in the leg, shortness of breath, chest pain Heart attack--pain or tightness in the chest, shoulders, arms, or jaw, nausea, shortness of breath, cold or clammy skin, feeling faint or lightheaded Heart failure--shortness of breath, swelling of the ankles, feet, or hands, sudden weight gain, unusual weakness or fatigue Increase in blood pressure Infection--fever, chills, cough, sore throat, wounds that don't heal, pain or trouble when passing urine, general feeling of discomfort or being unwell Infusion reactions--chest pain, shortness of breath or trouble breathing, feeling faint or lightheaded Kidney injury--decrease in the amount of urine, swelling of the ankles, hands, or feet Stomach pain that is severe, does not go away, or gets worse Stroke--sudden numbness or weakness of the face, arm, or leg, trouble speaking, confusion, trouble walking, loss of balance or coordination, dizziness, severe headache, change in vision Sudden and severe headache, confusion, change in vision, seizures, which may be signs of posterior reversible encephalopathy syndrome (PRES) Side effects that usually do not require medical attention (report to your care team if they continue or are bothersome): Back pain Change in taste Diarrhea Dry skin Increased tears Nosebleed This list may not describe all possible side effects. Call your doctor for medical advice about side effects. You may report side effects to FDA at 1-800-FDA-1088. Where should I keep my medication? This medication is given in a hospital or clinic. It will not be stored at home. NOTE: This sheet is a summary. It may not cover all possible information. If you have questions about this medicine, talk to your doctor, pharmacist, or health care provider.  2024 Else[employer]/Gold Standard (2021-10-03 00:00:00)Fluorouracil  Injection What is this medication? FLUOROURACIL  (flure oh YOOR a sil) treats some types of cancer. It works  by slowing down the growth of cancer cells. This medicine may be used for other purposes; ask your health care provider or pharmacist if you have questions. COMMON BRAND NAME(S): Adrucil  What should I tell my care team before I take this medication? They need to know if you have any of these conditions: Blood disorders Dihydropyrimidine dehydrogenase (DPD) deficiency Infection, such as chickenpox, cold sores, herpes Kidney disease Liver disease Poor nutrition Recent or ongoing radiation therapy An unusual or allergic reaction to fluorouracil , other medications, foods, dyes, or preservatives If you or your partner are pregnant or trying to get pregnant Breast-feeding How should I use this medication? This medication is injected into a vein. It is administered by your care team in a hospital or clinic setting. Talk to your care team about the use of this medication in children. Special care may be needed. Overdosage: If you think you have taken too much of this medicine contact a poison control center or emergency room at once. NOTE: This medicine is only for you. Do not share this medicine with others. What if I miss a dose? Keep appointments for follow-up doses. It is important not to miss your dose. Call your care team if you are unable to keep an appointment. What may interact with this medication? Do not take this medication with any of the following: Live virus vaccines This medication may also interact with the following: Medications that treat or prevent blood  clots, such as warfarin, enoxaparin, dalteparin This list may not describe all possible interactions. Give your health care provider a list of all the medicines, herbs, non-prescription drugs, or dietary supplements you use. Also tell them if you smoke, drink alcohol, or use illegal drugs. Some items may interact with your medicine. What should I watch for while using this medication? Your condition will be monitored  carefully while you are receiving this medication. This medication may make you feel generally unwell. This is not uncommon as chemotherapy can affect healthy cells as well as cancer cells. Report any side effects. Continue your course of treatment even though you feel ill unless your care team tells you to stop. In some cases, you may be given additional medications to help with side effects. Follow all directions for their use. This medication may increase your risk of getting an infection. Call your care team for advice if you get a fever, chills, sore throat, or other symptoms of a cold or flu. Do not treat yourself. Try to avoid being around people who are sick. This medication may increase your risk to bruise or bleed. Call your care team if you notice any unusual bleeding. Be careful brushing or flossing your teeth or using a toothpick because you may get an infection or bleed more easily. If you have any dental work done, tell your dentist you are receiving this medication. Avoid taking medications that contain aspirin, acetaminophen , ibuprofen, naproxen, or ketoprofen unless instructed by your care team. These medications may hide a fever. Do not treat diarrhea with over the counter products. Contact your care team if you have diarrhea that lasts more than 2 days or if it is severe and watery. This medication can make you more sensitive to the sun. Keep out of the sun. If you cannot avoid being in the sun, wear protective clothing and sunscreen. Do not use sun lamps, tanning beds, or tanning booths. Talk to your care team if you or your partner wish to become pregnant or think you might be pregnant. This medication can cause serious birth defects if taken during pregnancy and for 3 months after the last dose. A reliable form of contraception is recommended while taking this medication and for 3 months after the last dose. Talk to your care team about effective forms of contraception. Do not father  a child while taking this medication and for 3 months after the last dose. Use a condom while having sex during this time period. Do not breastfeed while taking this medication. This medication may cause infertility. Talk to your care team if you are concerned about your fertility. What side effects may I notice from receiving this medication? Side effects that you should report to your care team as soon as possible: Allergic reactions--skin rash, itching, hives, swelling of the face, lips, tongue, or throat Heart attack--pain or tightness in the chest, shoulders, arms, or jaw, nausea, shortness of breath, cold or clammy skin, feeling faint or lightheaded Heart failure--shortness of breath, swelling of the ankles, feet, or hands, sudden weight gain, unusual weakness or fatigue Heart rhythm changes--fast or irregular heartbeat, dizziness, feeling faint or lightheaded, chest pain, trouble breathing High ammonia level--unusual weakness or fatigue, confusion, loss of appetite, nausea, vomiting, seizures Infection--fever, chills, cough, sore throat, wounds that don't heal, pain or trouble when passing urine, general feeling of discomfort or being unwell Low red blood cell level--unusual weakness or fatigue, dizziness, headache, trouble breathing Pain, tingling, or numbness in the hands or feet,  muscle weakness, change in vision, confusion or trouble speaking, loss of balance or coordination, trouble walking, seizures Redness, swelling, and blistering of the skin over hands and feet Severe or prolonged diarrhea Unusual bruising or bleeding Side effects that usually do not require medical attention (report to your care team if they continue or are bothersome): Dry skin Headache Increased tears Nausea Pain, redness, or swelling with sores inside the mouth or throat Sensitivity to light Vomiting This list may not describe all possible side effects. Call your doctor for medical advice about side  effects. You may report side effects to FDA at 1-800-FDA-1088. Where should I keep my medication? This medication is given in a hospital or clinic. It will not be stored at home. NOTE: This sheet is a summary. It may not cover all possible information. If you have questions about this medicine, talk to your doctor, pharmacist, or health care provider.  2024 Else[employer]/Gold Standard (2021-09-23 00:00:00)Oxaliplatin  Injection What is this medication? OXALIPLATIN  (ox AL i PLA tin) treats colorectal cancer. It works by slowing down the growth of cancer cells. This medicine may be used for other purposes; ask your health care provider or pharmacist if you have questions. COMMON BRAND NAME(S): Eloxatin  What should I tell my care team before I take this medication? They need to know if you have any of these conditions: Heart disease History of irregular heartbeat or rhythm Liver disease Low blood cell levels (white cells, red cells, and platelets) Lung or breathing disease, such as asthma Take medications that treat or prevent blood clots Tingling of the fingers, toes, or other nerve disorder An unusual or allergic reaction to oxaliplatin , other medications, foods, dyes, or preservatives If you or your partner are pregnant or trying to get pregnant Breast-feeding How should I use this medication? This medication is injected into a vein. It is given by your care team in a hospital or clinic setting. Talk to your care team about the use of this medication in children. Special care may be needed. Overdosage: If you think you have taken too much of this medicine contact a poison control center or emergency room at once. NOTE: This medicine is only for you. Do not share this medicine with others. What if I miss a dose? Keep appointments for follow-up doses. It is important not to miss a dose. Call your care team if you are unable to keep an appointment. What may interact with this medication? Do not  take this medication with any of the following: Cisapride Dronedarone Pimozide Thioridazine This medication may also interact with the following: Aspirin and aspirin-like medications Certain medications that treat or prevent blood clots, such as warfarin, apixaban, dabigatran, and rivaroxaban Cisplatin Cyclosporine Diuretics Medications for infection, such as acyclovir, adefovir, amphotericin B, bacitracin , cidofovir, foscarnet, ganciclovir, gentamicin, pentamidine, vancomycin  NSAIDs, medications for pain and inflammation, such as ibuprofen or naproxen Other medications that cause heart rhythm changes Pamidronate Zoledronic acid This list may not describe all possible interactions. Give your health care provider a list of all the medicines, herbs, non-prescription drugs, or dietary supplements you use. Also tell them if you smoke, drink alcohol, or use illegal drugs. Some items may interact with your medicine. What should I watch for while using this medication? Your condition will be monitored carefully while you are receiving this medication. You may need blood work while taking this medication. This medication may make you feel generally unwell. This is not uncommon as chemotherapy can affect healthy cells as well as cancer cells.  Report any side effects. Continue your course of treatment even though you feel ill unless your care team tells you to stop. This medication may increase your risk of getting an infection. Call your care team for advice if you get a fever, chills, sore throat, or other symptoms of a cold or flu. Do not treat yourself. Try to avoid being around people who are sick. Avoid taking medications that contain aspirin, acetaminophen , ibuprofen, naproxen, or ketoprofen unless instructed by your care team. These medications may hide a fever. Be careful brushing or flossing your teeth or using a toothpick because you may get an infection or bleed more easily. If you have any  dental work done, tell your dentist you are receiving this medication. This medication can make you more sensitive to cold. Do not drink cold drinks or use ice. Cover exposed skin before coming in contact with cold temperatures or cold objects. When out in cold weather wear warm clothing and cover your mouth and nose to warm the air that goes into your lungs. Tell your care team if you get sensitive to the cold. Talk to your care team if you or your partner are pregnant or think either of you might be pregnant. This medication can cause serious birth defects if taken during pregnancy and for 9 months after the last dose. A negative pregnancy test is required before starting this medication. A reliable form of contraception is recommended while taking this medication and for 9 months after the last dose. Talk to your care team about effective forms of contraception. Do not father a child while taking this medication and for 6 months after the last dose. Use a condom while having sex during this time period. Do not breastfeed while taking this medication and for 3 months after the last dose. This medication may cause infertility. Talk to your care team if you are concerned about your fertility. What side effects may I notice from receiving this medication? Side effects that you should report to your care team as soon as possible: Allergic reactions--skin rash, itching, hives, swelling of the face, lips, tongue, or throat Bleeding--bloody or black, tar-like stools, vomiting blood or brown material that looks like coffee grounds, red or dark brown urine, small red or purple spots on skin, unusual bruising or bleeding Dry cough, shortness of breath or trouble breathing Heart rhythm changes--fast or irregular heartbeat, dizziness, feeling faint or lightheaded, chest pain, trouble breathing Infection--fever, chills, cough, sore throat, wounds that don't heal, pain or trouble when passing urine, general feeling  of discomfort or being unwell Liver injury--right upper belly pain, loss of appetite, nausea, light-colored stool, dark yellow or brown urine, yellowing skin or eyes, unusual weakness or fatigue Low red blood cell level--unusual weakness or fatigue, dizziness, headache, trouble breathing Muscle injury--unusual weakness or fatigue, muscle pain, dark yellow or brown urine, decrease in amount of urine Pain, tingling, or numbness in the hands or feet Sudden and severe headache, confusion, change in vision, seizures, which may be signs of posterior reversible encephalopathy syndrome (PRES) Unusual bruising or bleeding Side effects that usually do not require medical attention (report to your care team if they continue or are bothersome): Diarrhea Nausea Pain, redness, or swelling with sores inside the mouth or throat Unusual weakness or fatigue Vomiting This list may not describe all possible side effects. Call your doctor for medical advice about side effects. You may report side effects to FDA at 1-800-FDA-1088. Where should I keep my medication? This medication  is given in a hospital or clinic. It will not be stored at home. NOTE: This sheet is a summary. It may not cover all possible information. If you have questions about this medicine, talk to your doctor, pharmacist, or health care provider.  2024 [employer]/Gold Standard (2023-04-30 00:00:00)

## 2024-02-16 ENCOUNTER — Ambulatory Visit: Admitting: Oncology

## 2024-02-16 ENCOUNTER — Other Ambulatory Visit

## 2024-02-16 ENCOUNTER — Ambulatory Visit

## 2024-02-17 ENCOUNTER — Inpatient Hospital Stay

## 2024-02-17 VITALS — BP 118/79 | HR 95 | Temp 98.2°F | Resp 16 | Ht 68.0 in | Wt 144.1 lb

## 2024-02-17 DIAGNOSIS — C189 Malignant neoplasm of colon, unspecified: Secondary | ICD-10-CM

## 2024-02-17 DIAGNOSIS — C182 Malignant neoplasm of ascending colon: Secondary | ICD-10-CM

## 2024-02-17 DIAGNOSIS — Z5111 Encounter for antineoplastic chemotherapy: Secondary | ICD-10-CM | POA: Diagnosis not present

## 2024-02-17 DIAGNOSIS — C7800 Secondary malignant neoplasm of unspecified lung: Secondary | ICD-10-CM

## 2024-02-17 MED ORDER — PEGFILGRASTIM 6 MG/0.6ML ~~LOC~~ PSKT
6.0000 mg | PREFILLED_SYRINGE | Freq: Once | SUBCUTANEOUS | Status: AC
  Administered 2024-02-17: 6 mg via SUBCUTANEOUS
  Filled 2024-02-17: qty 0.6

## 2024-02-17 NOTE — Patient Instructions (Signed)

## 2024-02-18 ENCOUNTER — Encounter

## 2024-03-06 ENCOUNTER — Other Ambulatory Visit

## 2024-03-06 LAB — LAB REPORT - SCANNED: EGFR: 53

## 2024-03-06 NOTE — Progress Notes (Unsigned)
 [facility] at [facility] [address] [place],  [place]  [address] [phone]  Clinic Day:  02/18/2024  Referring physician: Glenford Harlene SAILOR, NP  HISTORY OF PRESENT ILLNESS:  The patient is a 58 y.o. female with metastatic colon cancer, which includes spread of disease to her abdominal cavity, lungs, brain, liver, and right adrenal gland.  She comes in today to be evaluated before heading into her 30th cycle of palliative chemotherapy, which consists of FOLFOX/Avastin .  Since her last visit, the patient has been doing fairly well.  She still has intermittent constipation/diarrhea.  She also claims to have fallen recently, for which she had no idea how she fell.  However, she denies having any new GI symptoms/findings which concern her for overt signs of disease progression.    With respect to her colon cancer history, she is status post a right hemicolectomy in early October 2018, followed by 12 cycles of adjuvant FOLFOX chemotherapy, which were completed in April 2019.  In December 2021, she underwent a left cerebellar metastasectomy, whose pathology was consistent with metastatic colon cancer.  Tumor testing did come back MMR normal. CT scans revealed evidence of her cancer being in multiple locations, for which she took 40 cycles of FOLFIRI/Avastin .  As follow-up scans revealed liver metastasis, she was placed back on FOLFOX/Avastin , for which she continues to take.  PHYSICAL EXAM:  Last menstrual period 02/22/2019. There is no height or weight on file to calculate BMI.  Performance status (ECOG): 1 - Symptomatic but completely ambulatory  Physical Exam Vitals and nursing note reviewed.  Constitutional:      General: She is not in acute distress.    Appearance: Normal appearance.     Comments: Her mentation appears slightly slower today   HENT:     Head: Normocephalic and atraumatic.     Mouth/Throat:     Mouth: Mucous membranes are moist.      Pharynx: Oropharynx is clear. No oropharyngeal exudate or posterior oropharyngeal erythema.  Eyes:     General: No scleral icterus.    Extraocular Movements: Extraocular movements intact.     Conjunctiva/sclera: Conjunctivae normal.     Pupils: Pupils are equal, round, and reactive to light.  Cardiovascular:     Rate and Rhythm: Normal rate and regular rhythm.     Heart sounds: Normal heart sounds. No murmur heard.    No friction rub. No gallop.  Pulmonary:     Effort: Pulmonary effort is normal.     Breath sounds: Normal breath sounds. No wheezing, rhonchi or rales.  Abdominal:     General: There is no distension.     Palpations: Abdomen is soft. There is no hepatomegaly, splenomegaly or mass.     Tenderness: There is no abdominal tenderness.  Musculoskeletal:        General: Normal range of motion.     Cervical back: Normal range of motion and neck supple. No tenderness.     Right lower leg: No edema.     Left lower leg: No edema.  Lymphadenopathy:     Cervical: No cervical adenopathy.     Upper Body:     Right upper body: No supraclavicular or axillary adenopathy.     Left upper body: No supraclavicular or axillary adenopathy.     Lower Body: No right inguinal adenopathy. No left inguinal adenopathy.  Skin:    General: Skin is warm and dry.     Coloration: Skin is not  jaundiced.     Findings: No rash.  Neurological:     Mental Status: She is alert and oriented to person, place, and time.     Cranial Nerves: No cranial nerve deficit.  Psychiatric:        Mood and Affect: Mood normal.        Behavior: Behavior normal.        Thought Content: Thought content normal.    LABS:      Latest Ref Rng & Units 02/15/2024    9:33 AM 01/26/2024   10:20 AM 01/04/2024    9:53 AM  CBC  WBC 4.0 - 10.5 K/uL 5.1  4.6  4.0   Hemoglobin 12.0 - 15.0 g/dL 89.0  89.0  88.8   Hematocrit 36.0 - 46.0 % 34.4  34.5  34.3   Platelets 150 - 400 K/uL 50  44  34       Latest Ref Rng & Units  02/15/2024    9:33 AM 01/26/2024   10:20 AM 01/04/2024    9:53 AM  CMP  Glucose 70 - 99 mg/dL 882  899  698   BUN 6 - 20 mg/dL 16  16  14    Creatinine 0.44 - 1.00 mg/dL 8.81  8.85  8.73   Sodium 135 - 145 mmol/L 140  143  139   Potassium 3.5 - 5.1 mmol/L 4.8  4.7  4.3   Chloride 98 - 111 mmol/L 103  105  102   CO2 22 - 32 mmol/L 26  28  27    Calcium  8.9 - 10.3 mg/dL 9.0  9.4  9.3   Total Protein 6.5 - 8.1 g/dL 6.1  6.0  6.0   Total Bilirubin 0.0 - 1.2 mg/dL 0.4  0.3  0.4   Alkaline Phos 38 - 126 U/L 302  306  338   AST 15 - 41 U/L 45  44  39   ALT 0 - 44 U/L 30  37  31    ASSESSMENT & PLAN:  Assessment/Plan:  A 58 y.o. female with metastatic colon cancer.  She will proceed her 30th cycle of FOLFOX/Avastin  today.  Clinically, the patient is doing okay.  Her mentation does appear slightly slower versus previous visits.  She is scheduled for brain MRI in February 2026.  I did ask her if she preferred to have 1 done sooner, for which she declined.  I will see her back in 3 weeks before she heads into her 31st cycle of FOLFOX/Avastin .  CT scans of her chest/abdomen/pelvis will be done today before her next visit to ascertain her new disease baseline after 30 cycles of palliative chemotherapy.  The patient understands all the plans discussed today and is in agreement with them.  Bailey Faiella DELENA Kerns, MD

## 2024-03-07 ENCOUNTER — Other Ambulatory Visit: Payer: Self-pay | Admitting: Oncology

## 2024-03-07 ENCOUNTER — Inpatient Hospital Stay

## 2024-03-07 ENCOUNTER — Telehealth: Payer: Self-pay

## 2024-03-07 ENCOUNTER — Other Ambulatory Visit

## 2024-03-07 ENCOUNTER — Inpatient Hospital Stay: Attending: Oncology | Admitting: Oncology

## 2024-03-07 ENCOUNTER — Encounter: Payer: Self-pay | Admitting: Oncology

## 2024-03-07 ENCOUNTER — Other Ambulatory Visit (HOSPITAL_COMMUNITY): Payer: Self-pay

## 2024-03-07 VITALS — BP 137/78 | HR 93 | Temp 98.7°F | Resp 14 | Ht 68.0 in | Wt 141.8 lb

## 2024-03-07 DIAGNOSIS — C189 Malignant neoplasm of colon, unspecified: Secondary | ICD-10-CM

## 2024-03-07 DIAGNOSIS — Z5111 Encounter for antineoplastic chemotherapy: Secondary | ICD-10-CM | POA: Insufficient documentation

## 2024-03-07 DIAGNOSIS — Z23 Encounter for immunization: Secondary | ICD-10-CM | POA: Insufficient documentation

## 2024-03-07 DIAGNOSIS — C7931 Secondary malignant neoplasm of brain: Secondary | ICD-10-CM | POA: Diagnosis not present

## 2024-03-07 DIAGNOSIS — C7800 Secondary malignant neoplasm of unspecified lung: Secondary | ICD-10-CM | POA: Diagnosis not present

## 2024-03-07 DIAGNOSIS — D696 Thrombocytopenia, unspecified: Secondary | ICD-10-CM | POA: Diagnosis not present

## 2024-03-07 DIAGNOSIS — C7971 Secondary malignant neoplasm of right adrenal gland: Secondary | ICD-10-CM | POA: Insufficient documentation

## 2024-03-07 DIAGNOSIS — C7802 Secondary malignant neoplasm of left lung: Secondary | ICD-10-CM | POA: Insufficient documentation

## 2024-03-07 DIAGNOSIS — C182 Malignant neoplasm of ascending colon: Secondary | ICD-10-CM | POA: Diagnosis not present

## 2024-03-07 DIAGNOSIS — C7801 Secondary malignant neoplasm of right lung: Secondary | ICD-10-CM | POA: Diagnosis not present

## 2024-03-07 DIAGNOSIS — C787 Secondary malignant neoplasm of liver and intrahepatic bile duct: Secondary | ICD-10-CM | POA: Insufficient documentation

## 2024-03-07 DIAGNOSIS — C786 Secondary malignant neoplasm of retroperitoneum and peritoneum: Secondary | ICD-10-CM | POA: Insufficient documentation

## 2024-03-07 MED ORDER — LONSURF 20-8.19 MG PO TABS: 60.0000 mg | ORAL_TABLET | Freq: Two times a day (BID) | ORAL | 2 refills | Status: DC

## 2024-03-07 NOTE — Progress Notes (Signed)
 DISCONTINUE ON PATHWAY REGIMEN - Colorectal     A cycle is every 14 days:     Bevacizumab -xxxx      Irinotecan       Leucovorin       Fluorouracil       Fluorouracil    **Always confirm dose/schedule in your pharmacy ordering system**  PRIOR TREATMENT: [id number]: FOLFIRI + Bevacizumab  q14 Days  START ON PATHWAY REGIMEN - Colorectal     A cycle is every 28 days:     Trifluridine and tipiracil      Bevacizumab -xxxx   **Always confirm dose/schedule in your pharmacy ordering system**  Patient Characteristics: Distant Metastases, Nonsurgical Candidate, Non-KRAS G12C, RAS Mutation Positive/Unknown (BRAF 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

## 2024-03-07 NOTE — Telephone Encounter (Signed)
 Oral Oncology Patient Advocate Encounter  Prior Authorization for trifluridine-tipiracil (LONSURF) 20-8.19 MG tablet  has been approved.    PA# [id number] Effective dates: 03/07/24 through 03/07/25  Patient must fill at [facility]   Lucie Lamer, CPhT [place]  [facility] Pharmacy Technician Patient Advocate Specialist II [facility] [place] Phone: [phone]  Fax: [phone] [email or web address]

## 2024-03-07 NOTE — Telephone Encounter (Signed)
 Oral Oncology Patient Advocate Encounter   Received notification that prior authorization for trifluridine-tipiracil (LONSURF) 20-8.19 MG tablet  is required.   PA submitted on 03/07/24 Key [id number] Status is pending     Lucie Lamer, CPhT [place]  [facility] Services Pharmacy Technician Patient Advocate Specialist II [facility] [place] Phone: [phone]  Fax: [phone] [email or web address]

## 2024-03-07 NOTE — Progress Notes (Signed)
 ON PATHWAY REGIMEN - Colorectal  No Change  Continue With Treatment as Ordered.  Original Decision Date/Time: 07/29/2020 13:26     A cycle is every 14 days:     Bevacizumab -xxxx      Irinotecan       Leucovorin       Fluorouracil       Fluorouracil    **Always confirm dose/schedule in your pharmacy ordering system**  Patient Characteristics: Distant Metastases, Nonsurgical Candidate, KRAS/NRAS Wild-Type (BRAF V600 Wild-Type/Unknown), Standard Cytotoxic Therapy, Second Line Standard Cytotoxic Therapy, Bevacizumab  Eligible Tumor Location: Colon Therapeutic Status: Distant Metastases Microsatellite/Mismatch Repair Status: MSS/pMMR BRAF Mutation Status: Wild-Type (no mutation) KRAS/NRAS Mutation Status: Wild-Type (no mutation) Preferred Therapy Approach: Standard Cytotoxic Therapy Standard Cytotoxic Line of Therapy: Second Line Standard Cytotoxic Therapy Bevacizumab  Eligibility: Eligible Intent of Therapy: Non-Curative / Palliative Intent, Discussed with Patient

## 2024-03-08 ENCOUNTER — Other Ambulatory Visit: Payer: Self-pay | Admitting: Oncology

## 2024-03-08 ENCOUNTER — Other Ambulatory Visit (HOSPITAL_COMMUNITY): Payer: Self-pay

## 2024-03-08 DIAGNOSIS — C189 Malignant neoplasm of colon, unspecified: Secondary | ICD-10-CM

## 2024-03-08 DIAGNOSIS — C182 Malignant neoplasm of ascending colon: Secondary | ICD-10-CM

## 2024-03-08 MED ORDER — LONSURF 20-8.19 MG PO TABS
60.0000 mg | ORAL_TABLET | Freq: Two times a day (BID) | ORAL | 2 refills | Status: DC
Start: 2024-03-08 — End: 2024-03-08

## 2024-03-08 MED ORDER — LONSURF 20-8.19 MG PO TABS
60.0000 mg | ORAL_TABLET | Freq: Two times a day (BID) | ORAL | 2 refills | Status: DC
Start: 2024-03-08 — End: 2024-03-09

## 2024-03-09 ENCOUNTER — Inpatient Hospital Stay: Admitting: Hematology and Oncology

## 2024-03-09 ENCOUNTER — Inpatient Hospital Stay

## 2024-03-09 ENCOUNTER — Other Ambulatory Visit (HOSPITAL_COMMUNITY): Payer: Self-pay

## 2024-03-09 MED ORDER — LONSURF 20-8.19 MG PO TABS: 60.0000 mg | ORAL_TABLET | Freq: Two times a day (BID) | ORAL | 2 refills | Status: AC

## 2024-03-10 ENCOUNTER — Other Ambulatory Visit (HOSPITAL_COMMUNITY): Payer: Self-pay

## 2024-03-10 NOTE — Telephone Encounter (Signed)
 Oral Chemotherapy Pharmacist Encounter    I spoke with patient for overview of: Lonsurf (trifluridine/tipiracil) for the treatment of metastatic colon cancer in combination with bevacizumab , planned duration until disease progression or unacceptable toxicity.   Treatment goal: Palliative   Counseled patient on administration, dosing, side effects, monitoring, drug-food interactions, safe handling, storage, and disposal.   Patient will take Lonsurf 20mg  (trifluridine component) tablets, 3 tablets (60mg  trifluridine) by mouth twice daily, within 1 hour of finishing AM & PM meals, on days 1-5 and days 8-12, every 28 days.  Patient plans to take Lonsurf M-F, Saturday and Sunday off days, Lonsurf M-F again, then no tablets for the next 2 weeks. Patient will start D1 of each cycle on Mondays.  Lonsurf start date: 03/13/2024   Adverse effects include but are not limited to: fatigue, nausea, vomiting, diarrhea, and decreased blood counts.    Patient has anti-emetic on hand and knows to take it if nausea develops.   Patient will obtain anti diarrheal and alert the office of 4 or more loose stools above baseline.  Patient updated about CBC check on Cycle 1 Day 14.   Reviewed with patient importance of keeping a medication schedule and plan for any missed doses. No barriers to medication adherence identified.   Medication reconciliation performed and medication/allergy list updated.  Distress thermometer not completed during telephone call as patient has been on previous lines of therapy.   Communication and Learning Assessment Primary learner: Patient Barriers to learning: No barriers Preferred language: English Learning preferences: Listening Reading  All questions answered.  Patient voiced understanding and appreciation.    Medication education handout placed in mail for patient. Patient knows to call the office with questions or concerns. Oral Chemotherapy Clinic phone number provided  to patient.    Ane Conerly, PharmD Hematology/Oncology Clinical Pharmacist [facility] Oral Chemotherapy Navigation Clinic [phone] 03/10/2024   4:18 PM

## 2024-03-13 ENCOUNTER — Inpatient Hospital Stay

## 2024-03-13 ENCOUNTER — Encounter: Payer: Self-pay | Admitting: Oncology

## 2024-03-13 VITALS — BP 107/75 | HR 94 | Temp 98.4°F | Resp 18

## 2024-03-13 DIAGNOSIS — C182 Malignant neoplasm of ascending colon: Secondary | ICD-10-CM

## 2024-03-13 DIAGNOSIS — Z5111 Encounter for antineoplastic chemotherapy: Secondary | ICD-10-CM | POA: Diagnosis not present

## 2024-03-13 DIAGNOSIS — C189 Malignant neoplasm of colon, unspecified: Secondary | ICD-10-CM

## 2024-03-13 DIAGNOSIS — Z23 Encounter for immunization: Secondary | ICD-10-CM

## 2024-03-13 LAB — CMP (CANCER CENTER ONLY)
ALT: 40 U/L (ref 0–44)
AST: 51 U/L — ABNORMAL HIGH (ref 15–41)
Albumin: 3.2 g/dL — ABNORMAL LOW (ref 3.5–5.0)
Alkaline Phosphatase: 307 U/L — ABNORMAL HIGH (ref 38–126)
Anion gap: 10 (ref 5–15)
BUN: 16 mg/dL (ref 6–20)
CO2: 26 mmol/L (ref 22–32)
Calcium: 9.1 mg/dL (ref 8.9–10.3)
Chloride: 104 mmol/L (ref 98–111)
Creatinine: 1.23 mg/dL — ABNORMAL HIGH (ref 0.44–1.00)
GFR, Estimated: 51 mL/min — ABNORMAL LOW (ref >60)
Glucose, Bld: 180 mg/dL — ABNORMAL HIGH (ref 70–99)
Potassium: 4.5 mmol/L (ref 3.5–5.1)
Sodium: 140 mmol/L (ref 135–145)
Total Bilirubin: 0.5 mg/dL (ref 0.0–1.2)
Total Protein: 5.8 g/dL — ABNORMAL LOW (ref 6.5–8.1)

## 2024-03-13 LAB — CBC WITH DIFFERENTIAL (CANCER CENTER ONLY)
Abs Immature Granulocytes: 0.02 K/uL (ref 0.00–0.07)
Basophils Absolute: 0 K/uL (ref 0.0–0.1)
Basophils Relative: 0 %
Eosinophils Absolute: 0.1 K/uL (ref 0.0–0.5)
Eosinophils Relative: 1 %
HCT: 31.5 % — ABNORMAL LOW (ref 36.0–46.0)
Hemoglobin: 10 g/dL — ABNORMAL LOW (ref 12.0–15.0)
Immature Granulocytes: 0 %
Lymphocytes Relative: 16 %
Lymphs Abs: 0.8 K/uL (ref 0.7–4.0)
MCH: 32.4 pg (ref 26.0–34.0)
MCHC: 31.7 g/dL (ref 30.0–36.0)
MCV: 101.9 fL — ABNORMAL HIGH (ref 80.0–100.0)
Monocytes Absolute: 0.6 K/uL (ref 0.1–1.0)
Monocytes Relative: 11 %
Neutro Abs: 3.7 K/uL (ref 1.7–7.7)
Neutrophils Relative %: 72 %
Platelet Count: 42 K/uL — ABNORMAL LOW (ref 150–400)
RBC: 3.09 MIL/uL — ABNORMAL LOW (ref 3.87–5.11)
RDW: 15.8 % — ABNORMAL HIGH (ref 11.5–15.5)
WBC Count: 5.1 K/uL (ref 4.0–10.5)
nRBC: 0 % (ref 0.0–0.2)

## 2024-03-13 LAB — CEA (ACCESS): CEA (CHCC): 113.41 ng/mL — ABNORMAL HIGH (ref 0.00–5.00)

## 2024-03-13 MED ORDER — INFLUENZA VIRUS VACC SPLIT PF (FLUZONE) 0.5 ML IM SUSY
0.5000 mL | PREFILLED_SYRINGE | Freq: Once | INTRAMUSCULAR | Status: AC
  Administered 2024-03-13: 0.5 mL via INTRAMUSCULAR
  Filled 2024-03-13: qty 0.5

## 2024-03-13 MED ORDER — SODIUM CHLORIDE 0.9 % IV SOLN
5.0000 mg/kg | Freq: Once | INTRAVENOUS | Status: AC
  Administered 2024-03-13: 300 mg via INTRAVENOUS
  Filled 2024-03-13: qty 12

## 2024-03-13 MED ORDER — SODIUM CHLORIDE 0.9 % IV SOLN: INTRAVENOUS | Status: DC

## 2024-03-13 NOTE — Patient Instructions (Signed)
 Bevacizumab Injection What is this medication? BEVACIZUMAB (be va SIZ yoo mab) treats some types of cancer. It works by blocking a protein that causes cancer cells to grow and multiply. This helps to slow or stop the spread of cancer cells. It is a monoclonal antibody. This medicine may be used for other purposes; ask your health care provider or pharmacist if you have questions. COMMON BRAND NAME(S): Alymsys, Avastin, MVASI, Rosaland Lao What should I tell my care team before I take this medication? They need to know if you have any of these conditions: Blood clots Coughing up blood Having or recent surgery Heart failure High blood pressure History of a connection between 2 or more body parts that do not usually connect (fistula) History of a tear in your stomach or intestines Protein in your urine An unusual or allergic reaction to bevacizumab, other medications, foods, dyes, or preservatives Pregnant or trying to get pregnant Breast-feeding How should I use this medication? This medication is injected into a vein. It is given by your care team in a hospital or clinic setting. Talk to your care team the use of this medication in children. Special care may be needed. Overdosage: If you think you have taken too much of this medicine contact a poison control center or emergency room at once. NOTE: This medicine is only for you. Do not share this medicine with others. What if I miss a dose? Keep appointments for follow-up doses. It is important not to miss your dose. Call your care team if you are unable to keep an appointment. What may interact with this medication? Interactions are not expected. This list may not describe all possible interactions. Give your health care provider a list of all the medicines, herbs, non-prescription drugs, or dietary supplements you use. Also tell them if you smoke, drink alcohol, or use illegal drugs. Some items may interact with your medicine. What  should I watch for while using this medication? Your condition will be monitored carefully while you are receiving this medication. You may need blood work while taking this medication. This medication may make you feel generally unwell. This is not uncommon as chemotherapy can affect healthy cells as well as cancer cells. Report any side effects. Continue your course of treatment even though you feel ill unless your care team tells you to stop. This medication may increase your risk to bruise or bleed. Call your care team if you notice any unusual bleeding. Before having surgery, talk to your care team to make sure it is ok. This medication can increase the risk of poor healing of your surgical site or wound. You will need to stop this medication for 28 days before surgery. After surgery, wait at least 28 days before restarting this medication. Make sure the surgical site or wound is healed enough before restarting this medication. Talk to your care team if questions. Talk to your care team if you may be pregnant. Serious birth defects can occur if you take this medication during pregnancy and for 6 months after the last dose. Contraception is recommended while taking this medication and for 6 months after the last dose. Your care team can help you find the option that works for you. Do not breastfeed while taking this medication and for 6 months after the last dose. This medication can cause infertility. Talk to your care team if you are concerned about your fertility. What side effects may I notice from receiving this medication? Side effects that you should  report to your care team as soon as possible: Allergic reactions--skin rash, itching, hives, swelling of the face, lips, tongue, or throat Bleeding--bloody or black, tar-like stools, vomiting blood or brown material that looks like coffee grounds, red or dark brown urine, small red or purple spots on skin, unusual bruising or bleeding Blood  clot--pain, swelling, or warmth in the leg, shortness of breath, chest pain Heart attack--pain or tightness in the chest, shoulders, arms, or jaw, nausea, shortness of breath, cold or clammy skin, feeling faint or lightheaded Heart failure--shortness of breath, swelling of the ankles, feet, or hands, sudden weight gain, unusual weakness or fatigue Increase in blood pressure Infection--fever, chills, cough, sore throat, wounds that don't heal, pain or trouble when passing urine, general feeling of discomfort or being unwell Infusion reactions--chest pain, shortness of breath or trouble breathing, feeling faint or lightheaded Kidney injury--decrease in the amount of urine, swelling of the ankles, hands, or feet Stomach pain that is severe, does not go away, or gets worse Stroke--sudden numbness or weakness of the face, arm, or leg, trouble speaking, confusion, trouble walking, loss of balance or coordination, dizziness, severe headache, change in vision Sudden and severe headache, confusion, change in vision, seizures, which may be signs of posterior reversible encephalopathy syndrome (PRES) Side effects that usually do not require medical attention (report to your care team if they continue or are bothersome): Back pain Change in taste Diarrhea Dry skin Increased tears Nosebleed This list may not describe all possible side effects. Call your doctor for medical advice about side effects. You may report side effects to FDA at 1-800-FDA-1088. Where should I keep my medication? This medication is given in a hospital or clinic. It will not be stored at home. NOTE: This sheet is a summary. It may not cover all possible information. If you have questions about this medicine, talk to your doctor, pharmacist, or health care provider.  2024 Elsevier/Gold Standard (2021-10-03 00:00:00)

## 2024-03-15 ENCOUNTER — Encounter: Payer: Self-pay | Admitting: Oncology

## 2024-03-17 ENCOUNTER — Encounter: Payer: Self-pay | Admitting: Oncology

## 2024-03-20 ENCOUNTER — Telehealth: Payer: Self-pay

## 2024-03-20 DIAGNOSIS — E039 Hypothyroidism, unspecified: Secondary | ICD-10-CM | POA: Diagnosis not present

## 2024-03-20 DIAGNOSIS — Z794 Long term (current) use of insulin: Secondary | ICD-10-CM | POA: Diagnosis not present

## 2024-03-20 DIAGNOSIS — E7800 Pure hypercholesterolemia, unspecified: Secondary | ICD-10-CM | POA: Diagnosis not present

## 2024-03-20 DIAGNOSIS — F339 Major depressive disorder, recurrent, unspecified: Secondary | ICD-10-CM | POA: Diagnosis not present

## 2024-03-20 DIAGNOSIS — E1169 Type 2 diabetes mellitus with other specified complication: Secondary | ICD-10-CM | POA: Diagnosis not present

## 2024-03-20 NOTE — Telephone Encounter (Signed)
 Pt is taking Lonsurf 20mg  3 tabs po after breakfast and 3 tabs po after supper. No missed doses. She states, I'm doing just fine, except real tired. I reminded pt to call us  if she were to develop temp of 100.4 or higher, day or night. She verbalized understanding and was appreciative of my call.

## 2024-03-27 ENCOUNTER — Other Ambulatory Visit: Payer: Self-pay | Admitting: Hematology and Oncology

## 2024-03-27 ENCOUNTER — Inpatient Hospital Stay

## 2024-03-27 ENCOUNTER — Encounter: Payer: Self-pay | Admitting: Oncology

## 2024-03-27 ENCOUNTER — Inpatient Hospital Stay (HOSPITAL_BASED_OUTPATIENT_CLINIC_OR_DEPARTMENT_OTHER): Admitting: Hematology and Oncology

## 2024-03-27 ENCOUNTER — Ambulatory Visit

## 2024-03-27 ENCOUNTER — Other Ambulatory Visit: Payer: Self-pay

## 2024-03-27 VITALS — BP 111/65 | HR 89 | Temp 98.8°F | Resp 16 | Ht 68.0 in | Wt 148.7 lb

## 2024-03-27 DIAGNOSIS — C189 Malignant neoplasm of colon, unspecified: Secondary | ICD-10-CM

## 2024-03-27 DIAGNOSIS — C182 Malignant neoplasm of ascending colon: Secondary | ICD-10-CM

## 2024-03-27 DIAGNOSIS — Z5111 Encounter for antineoplastic chemotherapy: Secondary | ICD-10-CM | POA: Diagnosis not present

## 2024-03-27 DIAGNOSIS — C7800 Secondary malignant neoplasm of unspecified lung: Secondary | ICD-10-CM

## 2024-03-27 DIAGNOSIS — D696 Thrombocytopenia, unspecified: Secondary | ICD-10-CM

## 2024-03-27 DIAGNOSIS — C7971 Secondary malignant neoplasm of right adrenal gland: Secondary | ICD-10-CM | POA: Diagnosis not present

## 2024-03-27 LAB — CBC WITH DIFFERENTIAL (CANCER CENTER ONLY)
Abs Immature Granulocytes: 0.01 K/uL (ref 0.00–0.07)
Basophils Absolute: 0 K/uL (ref 0.0–0.1)
Basophils Relative: 0 %
Eosinophils Absolute: 0.1 K/uL (ref 0.0–0.5)
Eosinophils Relative: 4 %
HCT: 25.1 % — ABNORMAL LOW (ref 36.0–46.0)
Hemoglobin: 8.1 g/dL — ABNORMAL LOW (ref 12.0–15.0)
Immature Granulocytes: 1 %
Lymphocytes Relative: 30 %
Lymphs Abs: 0.5 K/uL — ABNORMAL LOW (ref 0.7–4.0)
MCH: 32.3 pg (ref 26.0–34.0)
MCHC: 32.3 g/dL (ref 30.0–36.0)
MCV: 100 fL (ref 80.0–100.0)
Monocytes Absolute: 0.1 K/uL (ref 0.1–1.0)
Monocytes Relative: 5 %
Neutro Abs: 1.1 K/uL — ABNORMAL LOW (ref 1.7–7.7)
Neutrophils Relative %: 60 %
Platelet Count: 19 K/uL — ABNORMAL LOW (ref 150–400)
RBC: 2.51 MIL/uL — ABNORMAL LOW (ref 3.87–5.11)
RDW: 15.1 % (ref 11.5–15.5)
WBC Count: 1.7 K/uL — ABNORMAL LOW (ref 4.0–10.5)
nRBC: 0 % (ref 0.0–0.2)

## 2024-03-27 MED ORDER — SODIUM CHLORIDE 0.9 % IV SOLN: INTRAVENOUS | Status: DC

## 2024-03-27 NOTE — Patient Instructions (Signed)

## 2024-03-28 ENCOUNTER — Other Ambulatory Visit

## 2024-03-28 ENCOUNTER — Inpatient Hospital Stay

## 2024-03-28 ENCOUNTER — Ambulatory Visit: Admitting: Oncology

## 2024-03-28 ENCOUNTER — Ambulatory Visit

## 2024-03-28 DIAGNOSIS — D696 Thrombocytopenia, unspecified: Secondary | ICD-10-CM

## 2024-03-28 DIAGNOSIS — Z5111 Encounter for antineoplastic chemotherapy: Secondary | ICD-10-CM | POA: Diagnosis not present

## 2024-03-28 MED ORDER — SODIUM CHLORIDE 0.9% IV SOLUTION
250.0000 mL | INTRAVENOUS | Status: DC
  Administered 2024-03-28: 250 mL via INTRAVENOUS

## 2024-03-28 NOTE — Patient Instructions (Signed)
 Getting Platelets Through an IV (Platelet Transfusion) in Adults: What to Expect A platelet transfusion is when a person gets platelets from another person (donor) through an IV. Platelets are parts of blood that stick together and form a clot to help stop bleeding after an injury. If you don't have enough platelets, you might bleed or bruise easily. You may need a platelet transfusion if you have a health problem that causes a low number of platelets, such as thrombocytopenia. A platelet transfusion may be used to stop or prevent too much bleeding. Tell a health care provider about: Any reactions you've had during past transfusions. Any allergies you have. All medicines you take. These include vitamins, herbs, eye drops, and creams. Any bleeding problems you have. Any surgeries you've had. Any health problems you have. Whether you're pregnant or may be pregnant. What are the risks? Your health care provider will talk with you about risks. These may include: Fever or chills. This might happen if your body reacts to the new cells. It can happen during or up to 4 hours after the transfusion. A mild allergy. You might get red, swollen areas on your skin (hives). You might feel itchy. A bad allergy. You might have trouble breathing or swelling around your face and lips. Very bad reactions are rare but can happen. These may be: Infection. This is rare because donated blood is carefully tested. Hemolytic reaction. This is when the defense system (immune system) of your body attacks the new cells. Symptoms include fever, chills, and a feeling that you may throw up. You may also have low blood pressure and pain in your chest or lower back. Transfusion-associated circulatory overload (TACO). This happens if fluids move to your lungs. This may cause breathing problems. Transfusion-related acute lung injury (TRALI). TRALI can cause breathing problems and low oxygen in your blood. This can happen within  hours or days after the transfusion. Transfusion-associated graft-versus-host disease (TAGVHD). This happens when donated cells attack the body's healthy tissues. What happens before? Take your medicines only as told. You will have a blood test to find out your blood type. This helps match the donor blood with your blood. If you've had an allergy to a transfusion in the past, you may be given medicine to help prevent another allergy. Your temperature, blood pressure, pulse, and breathing will be checked. What happens during platelet transfusion?  An IV will be put into a vein in your hand or arm. For your safety, two health care team members will check your identity and the donor platelets. The bag of donor platelets will be connected to your IV. The platelets will flow into your blood. This usually takes 30-60 minutes. Your temperature, blood pressure, pulse, and breathing will be checked. This helps catch signs of a reaction early. You will be watched for symptoms of all types of reactions, like chills, hives, or itching. If you show signs of a reaction, the transfusion will be stopped. You may be given medicine to help treat the reaction. When your transfusion is done, your IV will be removed. Pressure may be put on the IV site for a few minutes to stop any bleeding. The IV site will be covered with a bandage. These steps may vary. Ask what you can expect. What happens after? You will be watched closely until you leave. This includes checking your blood pressure, temperature, pulse rate, and breathing rate again. This information is not intended to replace advice given to you by your health care  provider. Make sure you discuss any questions you have with your health care provider. Document Revised: 09/03/2023 Document Reviewed: 09/03/2023 Elsevier Patient Education  2025 [employer].

## 2024-03-29 LAB — BPAM PLATELET PHERESIS
Blood Product Expiration Date: 202510302359
ISSUE DATE / TIME: 202510281135
Unit Type and Rh: 202510302359
Unit Type and Rh: 6200

## 2024-03-29 LAB — PREPARE PLATELET PHERESIS: Unit division: 0

## 2024-03-30 ENCOUNTER — Encounter

## 2024-03-30 ENCOUNTER — Other Ambulatory Visit (HOSPITAL_COMMUNITY): Payer: Self-pay

## 2024-04-03 ENCOUNTER — Telehealth: Payer: Self-pay

## 2024-04-03 NOTE — Telephone Encounter (Signed)
 I spoke with pt and completed the chemo follow up call flowsheet. She is struggling with diarrhea, so I reviewed the correct dosing for Imodium. She hadn't taken any Imodium. She is to call me if the Imodium doesn't help, so we can get something stronger ordered. She mentioned she has occasional heart palpitations and SOB all the time (baseline). Her appetite isn't the best. She tries to make herself eat, and uses Glucerna PRN. No recent N/V. Pt reminded to call is she develops temp of 100.4 or higher, day or night. Pt asked a question about the Lonsurf and Avastin . She said when she sees Dr Ezzard and gets the Avastin  next Monday, she will be off her Lonsurf at that time. Pt states she didn't get Avastin  last time. Dr Ezzard Pulling P,RPH, and Eleanor P,NP notified of above.

## 2024-04-04 ENCOUNTER — Telehealth: Payer: Self-pay

## 2024-04-04 NOTE — Telephone Encounter (Addendum)
 Pt slept in late so she has only had 1 episode of diarrhea today. I told her the providers recommend a stool sample. She will come by or have someone t come by and pick up supplies to collect specimen at home. I told her the specimen needs to be returned to our office within 1 hour. She confirmed understanding and verified that the diarrhea is all watery. Message sent to Dr Ezzard, Eleanor P,NP, and Devere Afton LIED.

## 2024-04-05 ENCOUNTER — Telehealth: Payer: Self-pay

## 2024-04-05 NOTE — Telephone Encounter (Addendum)
 Pt called to let me know that the diarrhea is better and she feels better. Her mom picked up the stool specimen kit this afternoon, so if she has anymore diarrhea she will collect sample & bring in. Pt mentioned that she has forgotten to tell it me (when I have called her)..but she has had 3 separate episodes of waking with a pea sized blood clot on her tongue since starting Lonsurf. She was unable to recall if she has had an episode since her platelet infusion on the 27th. She wanted to make sure we knew about it. She doesn't have any mouth sores. Message sent to [facility], Dr Ezzard, and Devere Afton LIED.

## 2024-04-06 ENCOUNTER — Encounter: Payer: Self-pay | Admitting: Oncology

## 2024-04-07 NOTE — Progress Notes (Signed)
 [facility] at [facility] [address] [place],  [place]  [address] [phone]  Clinic Day:  03/27/2024  Referring physician: Glenford Harlene SAILOR, NP  HISTORY OF PRESENT ILLNESS:  The patient is a 58 y.o. female with metastatic colon cancer, which includes spread of disease to her abdominal cavity, lungs, brain, liver, and right adrenal gland.  She comes in today to be evaluated before heading into her 30th cycle of palliative chemotherapy, which consists of FOLFOX/Avastin .  Since her last visit, the patient has been doing fairly well.  She still has intermittent constipation/diarrhea.  However, she denies having any new GI symptoms which concerned her for overt signs of disease progression  With respect to her colon cancer history, she is status post a right hemicolectomy in early October 2018, followed by 12 cycles of adjuvant FOLFOX chemotherapy, which were completed in April 2019.  In December 2021, she underwent a left cerebellar metastasectomy, whose pathology was consistent with metastatic colon cancer.  Tumor testing did come back MMR normal. CT scans revealed evidence of her cancer being in multiple locations, for which she took 40 cycles of FOLFIRI/Avastin .  As follow-up scans revealed liver metastasis, she was placed back on FOLFOX/Avastin , for which she continues to take.  PHYSICAL EXAM:  Blood pressure 111/65, pulse 89, temperature 98.8 F (37.1 C), temperature source Oral, resp. rate 16, height 5' 8 (1.727 m), weight 148 lb 11.2 oz (67.4 kg), last menstrual period 02/22/2019, SpO2 100%. Body mass index is 22.61 kg/m.  Performance status (ECOG): 1 - Symptomatic but completely ambulatory  Physical Exam Vitals and nursing note reviewed.  Constitutional:      General: She is not in acute distress.    Appearance: Normal appearance.     Comments:    HENT:     Head: Normocephalic and atraumatic.     Mouth/Throat:     Mouth: Mucous membranes  are moist.     Pharynx: Oropharynx is clear. No oropharyngeal exudate or posterior oropharyngeal erythema.  Eyes:     General: No scleral icterus.    Extraocular Movements: Extraocular movements intact.     Conjunctiva/sclera: Conjunctivae normal.     Pupils: Pupils are equal, round, and reactive to light.  Cardiovascular:     Rate and Rhythm: Normal rate and regular rhythm.     Heart sounds: Normal heart sounds. No murmur heard.    No friction rub. No gallop.  Pulmonary:     Effort: Pulmonary effort is normal.     Breath sounds: Normal breath sounds. No wheezing, rhonchi or rales.  Abdominal:     General: There is no distension.     Palpations: Abdomen is soft. There is no hepatomegaly, splenomegaly or mass.     Tenderness: There is no abdominal tenderness.  Musculoskeletal:        General: Normal range of motion.     Cervical back: Normal range of motion and neck supple. No tenderness.     Right lower leg: No edema.     Left lower leg: No edema.  Lymphadenopathy:     Cervical: No cervical adenopathy.     Upper Body:     Right upper body: No supraclavicular or axillary adenopathy.     Left upper body: No supraclavicular or axillary adenopathy.     Lower Body: No right inguinal adenopathy. No left inguinal adenopathy.  Skin:    General: Skin is warm and dry.     Coloration: Skin is  not jaundiced.     Findings: No rash.  Neurological:     Mental Status: She is alert and oriented to person, place, and time.     Cranial Nerves: No cranial nerve deficit.  Psychiatric:        Mood and Affect: Mood normal.        Behavior: Behavior normal.        Thought Content: Thought content normal.   SCANS: CT scans of her chest/abdomen/pelvis done on 03/06/2024 revealed the following:   FINDINGS: CHEST: Lines and tubes: Right Port-A-Cath is in place.  Mediastinum: No new mass  Heart: Normal-size. No pericardial effusion.  Thoracic Aorta: Normal.  Lungs and Airways: The previously  identified pulmonary nodules and masses are again noted. No new mass or nodule. The lesions have increased in size. Index lesion right upper lobe is multi part, lobular. In aggregate it is approximately 5.5 x 2.5 cm. On prior when measured similarly was 4.9 x 1.8 cm. Index subpleural lesion right lower lobe measures 2.3 x 1.9 cm, while on prior it was 2.2 x 1.5 cm when measured similarly.  No consolidation.  Pleura: No pleural effusion or pneumothorax.  Bones: No fracture  ABDOMEN AND PELVIS: Liver: Mass in the caudate lobe (segment 1) is 17 mm, while on prior it was 12 mm when measured similarly. Posterior segment right lobe lesion is 2.8 x 2.0 cm, while on prior is 1.8 x 1.6 cm when measured similarly.  Gallbladder: Contracted. Qualitative thickening of the gallbladder wall similar to prior exam and unchanged. No biliary duct dilation.  Spleen: Enlarged. No change.  Pancreas: Normal.  Adrenals: Normal  Kidneys: Normal  Bladder: Normal  Reproductive Organs: Hysterectomy.  Bowel: Partial right hemicolectomy.  Vasculature: Mild-to-moderate atherosclerotic plaque without aneurysm.  Peritoneum / Retroperitoneum: Congestion of the omentum anteriorly just below the abdominal wall. Qualitative thickening of the peritoneal reflections of the cul-de-sac and congestion in the perirectal fat. No change. Small volume free fluid in the cul-de-sac. Scant perihepatic fluid.  Bones: Mid to lower thoracic spine bridging osteophytes. Lower lumbar spine facet hypertrophy.  IMPRESSION: 1. Chest: No new lesion. Interval progression of the previously identified lesions. 2. Abdomen: No new liver lesion. Interval enlargement of the previously identified lesions. Splenomegaly. 3. Pelvis: No new mass. Hysterectomy. Qualitative thickening of the peritoneal reflection and congestion in the perirectal fat. Non-specific. Small volume free fluid in the cul-de-sac. Peritoneal disease is not excluded.  LABS:       Latest Ref Rng & Units 03/27/2024    1:33 PM 03/13/2024   10:20 AM 02/15/2024    9:33 AM  CBC  WBC 4.0 - 10.5 K/uL 1.7  5.1  5.1   Hemoglobin 12.0 - 15.0 g/dL 8.1  89.9  89.0   Hematocrit 36.0 - 46.0 % 25.1  31.5  34.4   Platelets 150 - 400 K/uL 19  42  50       Latest Ref Rng & Units 03/13/2024   10:20 AM 02/15/2024    9:33 AM 01/26/2024   10:20 AM  CMP  Glucose 70 - 99 mg/dL 819  882  899   BUN 6 - 20 mg/dL 16  16  16    Creatinine 0.44 - 1.00 mg/dL 8.76  8.81  8.85   Sodium 135 - 145 mmol/L 140  140  143   Potassium 3.5 - 5.1 mmol/L 4.5  4.8  4.7   Chloride 98 - 111 mmol/L 104  103  105   CO2 22 -  32 mmol/L 26  26  28    Calcium  8.9 - 10.3 mg/dL 9.1  9.0  9.4   Total Protein 6.5 - 8.1 g/dL 5.8  6.1  6.0   Total Bilirubin 0.0 - 1.2 mg/dL 0.5  0.4  0.3   Alkaline Phos 38 - 126 U/L 307  302  306   AST 15 - 41 U/L 51  45  44   ALT 0 - 44 U/L 40  30  37    ASSESSMENT & PLAN:  Assessment/Plan:  A 58 y.o. female with metastatic colon cancer.  In clinic today, I went over all of her CT scan images with her, for which she could see that the known lesions in her lungs and liver have mildly progressed in size.  Although no new lesions were seen, the fact that her current lesions have worsened suggests that her disease is no longer responding to her FOLFOX/Avastin .  In clinic today, we talked about other palliative options to consider for her disease management.  As neither Guardant nor Foundation One testing showed any type of alterations/mutations with for disease, she will proceed with standard third line therapy, which will be Lonsurf /Avastin .  Lonsurf  will be given at 60 mg twice daily Monday through Friday for 2 straight weeks, followed by a 2-week break.  Avastin  will continue to be given once every 2 weeks.  She was made aware of the side effects which can go along with Lonsurf , including cytopenias.  I once again stressed with her about potentially going to a local academic center  to discuss clinical trials.  I stated to the patient that I historically have been underwhelmed with how efficacious approved oral therapy has been with metastatic colorectal cancer, thus the need for her to seriously consider seeing what local academic facilities may have, with respect to an attractive clinical trial.  I still think she would be a strong clinical trial candidate as her ECOG performance status is still at 1.I will see her back 4 weeks later before she heads into her second cycle of treatment.  The patient understands all the plans discussed today and is in agreement with them.  Eleanor DELENA Bach, NP

## 2024-04-09 NOTE — Progress Notes (Unsigned)
 [facility] at [facility] [address] [place],  [place]  [address] [phone]  Clinic Day:  04/10/2024  Referring physician: Glenford Harlene SAILOR, NP  HISTORY OF PRESENT ILLNESS:  The patient is a 58 y.o. female with metastatic colon cancer, which includes spread of disease to her abdominal cavity, lungs, brain, liver, and right adrenal gland.  She comes in today to be evaluated before heading into her 2nd cycle of Lonsurf/Avastin .  The patient claims to have tolerated her 1st cycle  With respect to her colon cancer history, she is status post a right hemicolectomy in early October 2018, followed by 12 cycles of adjuvant FOLFOX chemotherapy, which were completed in April 2019.  In December 2021, she underwent a left cerebellar metastasectomy, whose pathology was consistent with metastatic colon cancer.  Tumor testing did come back MMR normal. CT scans revealed evidence of her cancer being in multiple locations, for which she took 40 cycles of FOLFIRI/Avastin .  As follow-up scans revealed liver metastasis, she was placed back on FOLFOX/Avastin .  She underwent 30 cycles of this regimen before scans showed disease progression.  This led to her recently being switched to Lonsurf/Avastin .  PHYSICAL EXAM:  Last menstrual period 02/22/2019. There is no height or weight on file to calculate BMI.  Performance status (ECOG): 1 - Symptomatic but completely ambulatory  Physical Exam Vitals and nursing note reviewed.  Constitutional:      General: She is not in acute distress.    Appearance: Normal appearance.     Comments:    HENT:     Head: Normocephalic and atraumatic.     Mouth/Throat:     Mouth: Mucous membranes are moist.     Pharynx: Oropharynx is clear. No oropharyngeal exudate or posterior oropharyngeal erythema.  Eyes:     General: No scleral icterus.    Extraocular Movements: Extraocular movements intact.     Conjunctiva/sclera: Conjunctivae normal.      Pupils: Pupils are equal, round, and reactive to light.  Cardiovascular:     Rate and Rhythm: Normal rate and regular rhythm.     Heart sounds: Normal heart sounds. No murmur heard.    No friction rub. No gallop.  Pulmonary:     Effort: Pulmonary effort is normal.     Breath sounds: Normal breath sounds. No wheezing, rhonchi or rales.  Abdominal:     General: There is no distension.     Palpations: Abdomen is soft. There is no hepatomegaly, splenomegaly or mass.     Tenderness: There is no abdominal tenderness.  Musculoskeletal:        General: Normal range of motion.     Cervical back: Normal range of motion and neck supple. No tenderness.     Right lower leg: No edema.     Left lower leg: No edema.  Lymphadenopathy:     Cervical: No cervical adenopathy.     Upper Body:     Right upper body: No supraclavicular or axillary adenopathy.     Left upper body: No supraclavicular or axillary adenopathy.     Lower Body: No right inguinal adenopathy. No left inguinal adenopathy.  Skin:    General: Skin is warm and dry.     Coloration: Skin is not jaundiced.     Findings: No rash.  Neurological:     Mental Status: She is alert and oriented to person, place, and time.     Cranial Nerves: No cranial nerve deficit.  Psychiatric:  Mood and Affect: Mood normal.        Behavior: Behavior normal.        Thought Content: Thought content normal.   SCANS: CT scans of her chest/abdomen/pelvis done on 03/06/2024 revealed the following:   FINDINGS: CHEST: Lines and tubes: Right Port-A-Cath is in place.  Mediastinum: No new mass  Heart: Normal-size. No pericardial effusion.  Thoracic Aorta: Normal.  Lungs and Airways: The previously identified pulmonary nodules and masses are again noted. No new mass or nodule. The lesions have increased in size. Index lesion right upper lobe is multi part, lobular. In aggregate it is approximately 5.5 x 2.5 cm. On prior when measured similarly was  4.9 x 1.8 cm. Index subpleural lesion right lower lobe measures 2.3 x 1.9 cm, while on prior it was 2.2 x 1.5 cm when measured similarly.  No consolidation.  Pleura: No pleural effusion or pneumothorax.  Bones: No fracture  ABDOMEN AND PELVIS: Liver: Mass in the caudate lobe (segment 1) is 17 mm, while on prior it was 12 mm when measured similarly. Posterior segment right lobe lesion is 2.8 x 2.0 cm, while on prior is 1.8 x 1.6 cm when measured similarly.  Gallbladder: Contracted. Qualitative thickening of the gallbladder wall similar to prior exam and unchanged. No biliary duct dilation.  Spleen: Enlarged. No change.  Pancreas: Normal.  Adrenals: Normal  Kidneys: Normal  Bladder: Normal  Reproductive Organs: Hysterectomy.  Bowel: Partial right hemicolectomy.  Vasculature: Mild-to-moderate atherosclerotic plaque without aneurysm.  Peritoneum / Retroperitoneum: Congestion of the omentum anteriorly just below the abdominal wall. Qualitative thickening of the peritoneal reflections of the cul-de-sac and congestion in the perirectal fat. No change. Small volume free fluid in the cul-de-sac. Scant perihepatic fluid.  Bones: Mid to lower thoracic spine bridging osteophytes. Lower lumbar spine facet hypertrophy.  IMPRESSION: 1. Chest: No new lesion. Interval progression of the previously identified lesions. 2. Abdomen: No new liver lesion. Interval enlargement of the previously identified lesions. Splenomegaly. 3. Pelvis: No new mass. Hysterectomy. Qualitative thickening of the peritoneal reflection and congestion in the perirectal fat. Non-specific. Small volume free fluid in the cul-de-sac. Peritoneal disease is not excluded.  LABS:      Latest Ref Rng & Units 03/27/2024    1:33 PM 03/13/2024   10:20 AM 02/15/2024    9:33 AM  CBC  WBC 4.0 - 10.5 K/uL 1.7  5.1  5.1   Hemoglobin 12.0 - 15.0 g/dL 8.1  89.9  89.0   Hematocrit 36.0 - 46.0 % 25.1  31.5  34.4   Platelets 150 - 400  K/uL 19  42  50       Latest Ref Rng & Units 03/13/2024   10:20 AM 02/15/2024    9:33 AM 01/26/2024   10:20 AM  CMP  Glucose 70 - 99 mg/dL 819  882  899   BUN 6 - 20 mg/dL 16  16  16    Creatinine 0.44 - 1.00 mg/dL 8.76  8.81  8.85   Sodium 135 - 145 mmol/L 140  140  143   Potassium 3.5 - 5.1 mmol/L 4.5  4.8  4.7   Chloride 98 - 111 mmol/L 104  103  105   CO2 22 - 32 mmol/L 26  26  28    Calcium  8.9 - 10.3 mg/dL 9.1  9.0  9.4   Total Protein 6.5 - 8.1 g/dL 5.8  6.1  6.0   Total Bilirubin 0.0 - 1.2 mg/dL 0.5  0.4  0.3  Alkaline Phos 38 - 126 U/L 307  302  306   AST 15 - 41 U/L 51  45  44   ALT 0 - 44 U/L 40  30  37    ASSESSMENT & PLAN:  Assessment/Plan:  A 58 y.o. female with metastatic colon cancer.  In clinic today, I went over all of her CT scan images with her, for which she could see that the known lesions in her lungs and liver have mildly progressed in size.  Although no new lesions were seen, the fact that her current lesions have worsened suggests that her disease is no longer responding to her FOLFOX/Avastin .  In clinic today, we talked about other palliative options to consider for her disease management.  As neither Guardant nor Foundation One testing showed any type of alterations/mutations with for disease, she will proceed with standard third line therapy, which will be Lonsurf/Avastin .  Lonsurf will be given at 60 mg twice daily Monday through Friday for 2 straight weeks, followed by a 2-week break.  Avastin  will continue to be given once every 2 weeks.  She was made aware of the side effects which can go along with Lonsurf, including cytopenias.  I once again stressed with her about potentially going to a local academic center to discuss clinical trials.  I stated to the patient that I historically have been underwhelmed with how efficacious approved oral therapy has been with metastatic colorectal cancer, thus the need for her to seriously consider seeing what local academic  facilities may have, with respect to an attractive clinical trial.  I still think she would be a strong clinical trial candidate as her ECOG performance status is still at 1.  Nevertheless, her first cycle of Lonsurf/Avastin  will commence on Monday, October 13th.  I will see her back 4 weeks later before she heads into her second cycle of treatment.  The patient understands all the plans discussed today and is in agreement with them.  Ramandeep Arington DELENA Kerns, MD

## 2024-04-10 ENCOUNTER — Other Ambulatory Visit: Payer: Self-pay | Admitting: Oncology

## 2024-04-10 ENCOUNTER — Telehealth: Payer: Self-pay

## 2024-04-10 ENCOUNTER — Inpatient Hospital Stay

## 2024-04-10 ENCOUNTER — Other Ambulatory Visit: Payer: Self-pay

## 2024-04-10 ENCOUNTER — Encounter: Payer: Self-pay | Admitting: Oncology

## 2024-04-10 ENCOUNTER — Inpatient Hospital Stay: Attending: Oncology | Admitting: Oncology

## 2024-04-10 DIAGNOSIS — C189 Malignant neoplasm of colon, unspecified: Secondary | ICD-10-CM

## 2024-04-10 DIAGNOSIS — C182 Malignant neoplasm of ascending colon: Secondary | ICD-10-CM

## 2024-04-10 DIAGNOSIS — D696 Thrombocytopenia, unspecified: Secondary | ICD-10-CM | POA: Diagnosis not present

## 2024-04-10 DIAGNOSIS — C7971 Secondary malignant neoplasm of right adrenal gland: Secondary | ICD-10-CM | POA: Insufficient documentation

## 2024-04-10 DIAGNOSIS — C7801 Secondary malignant neoplasm of right lung: Secondary | ICD-10-CM | POA: Insufficient documentation

## 2024-04-10 DIAGNOSIS — R188 Other ascites: Secondary | ICD-10-CM | POA: Diagnosis not present

## 2024-04-10 DIAGNOSIS — C7800 Secondary malignant neoplasm of unspecified lung: Secondary | ICD-10-CM

## 2024-04-10 DIAGNOSIS — C787 Secondary malignant neoplasm of liver and intrahepatic bile duct: Secondary | ICD-10-CM | POA: Diagnosis not present

## 2024-04-10 DIAGNOSIS — C183 Malignant neoplasm of hepatic flexure: Secondary | ICD-10-CM

## 2024-04-10 DIAGNOSIS — C7802 Secondary malignant neoplasm of left lung: Secondary | ICD-10-CM | POA: Insufficient documentation

## 2024-04-10 DIAGNOSIS — C786 Secondary malignant neoplasm of retroperitoneum and peritoneum: Secondary | ICD-10-CM | POA: Insufficient documentation

## 2024-04-10 DIAGNOSIS — D509 Iron deficiency anemia, unspecified: Secondary | ICD-10-CM

## 2024-04-10 DIAGNOSIS — Z5111 Encounter for antineoplastic chemotherapy: Secondary | ICD-10-CM | POA: Diagnosis present

## 2024-04-10 LAB — CBC WITH DIFFERENTIAL (CANCER CENTER ONLY)
Abs Immature Granulocytes: 0 K/uL (ref 0.00–0.07)
Basophils Absolute: 0 K/uL (ref 0.0–0.1)
Basophils Relative: 0 %
Eosinophils Absolute: 0 K/uL (ref 0.0–0.5)
Eosinophils Relative: 2 %
HCT: 25.1 % — ABNORMAL LOW (ref 36.0–46.0)
Hemoglobin: 7.8 g/dL — ABNORMAL LOW (ref 12.0–15.0)
Immature Granulocytes: 0 %
Lymphocytes Relative: 45 %
Lymphs Abs: 0.5 K/uL — ABNORMAL LOW (ref 0.7–4.0)
MCH: 32.9 pg (ref 26.0–34.0)
MCHC: 31.1 g/dL (ref 30.0–36.0)
MCV: 105.9 fL — ABNORMAL HIGH (ref 80.0–100.0)
Monocytes Absolute: 0.3 K/uL (ref 0.1–1.0)
Monocytes Relative: 23 %
Neutro Abs: 0.4 K/uL — CL (ref 1.7–7.7)
Neutrophils Relative %: 30 %
Platelet Count: 13 K/uL — ABNORMAL LOW (ref 150–400)
RBC: 2.37 MIL/uL — ABNORMAL LOW (ref 3.87–5.11)
RDW: 19.8 % — ABNORMAL HIGH (ref 11.5–15.5)
WBC Count: 1.2 K/uL — ABNORMAL LOW (ref 4.0–10.5)
nRBC: 0 % (ref 0.0–0.2)

## 2024-04-10 LAB — CEA (ACCESS): CEA (CHCC): 104.23 ng/mL — ABNORMAL HIGH (ref 0.00–5.00)

## 2024-04-10 LAB — TOTAL PROTEIN, URINE DIPSTICK: Protein, ur: NEGATIVE mg/dL

## 2024-04-10 NOTE — Telephone Encounter (Signed)
 CRITICAL VALUE STICKER  CRITICAL VALUE: ANC 0.4  RECEIVER (on-site recipient of call): JON, LPN  DATE & TIME NOTIFIED: 04/10/24 1338  MESSENGER (representative from lab): J[facility]  MD NOTIFIED: DR.[email or web address]   TIME OF NOTIFICATION: 1339  RESPONSE:  OK THANKS

## 2024-04-11 ENCOUNTER — Ambulatory Visit: Admitting: Psychiatry

## 2024-04-11 ENCOUNTER — Encounter: Payer: Self-pay | Admitting: Psychiatry

## 2024-04-11 DIAGNOSIS — F422 Mixed obsessional thoughts and acts: Secondary | ICD-10-CM | POA: Diagnosis not present

## 2024-04-11 DIAGNOSIS — R530 Neoplastic (malignant) related fatigue: Secondary | ICD-10-CM | POA: Diagnosis not present

## 2024-04-11 DIAGNOSIS — G4721 Circadian rhythm sleep disorder, delayed sleep phase type: Secondary | ICD-10-CM

## 2024-04-11 DIAGNOSIS — F333 Major depressive disorder, recurrent, severe with psychotic symptoms: Secondary | ICD-10-CM | POA: Diagnosis not present

## 2024-04-11 DIAGNOSIS — G3184 Mild cognitive impairment, so stated: Secondary | ICD-10-CM | POA: Diagnosis not present

## 2024-04-11 LAB — PREPARE RBC (CROSSMATCH)

## 2024-04-11 MED ORDER — FLUVOXAMINE MALEATE 100 MG PO TABS: 300.0000 mg | ORAL_TABLET | Freq: Every day | ORAL | 0 refills | Status: AC

## 2024-04-11 MED ORDER — TRAZODONE HCL 100 MG PO TABS: 100.0000 mg | ORAL_TABLET | Freq: Every day | ORAL | 0 refills | Status: DC

## 2024-04-11 MED ORDER — AUVELITY 45-105 MG PO TBCR: EXTENDED_RELEASE_TABLET | ORAL | 0 refills | Status: DC

## 2024-04-11 MED ORDER — AUVELITY 45-105 MG PO TBCR: 1.0000 | EXTENDED_RELEASE_TABLET | Freq: Two times a day (BID) | ORAL | 0 refills | Status: AC

## 2024-04-11 MED ORDER — MODAFINIL 200 MG PO TABS: ORAL_TABLET | ORAL | 1 refills | Status: DC

## 2024-04-11 NOTE — Progress Notes (Signed)
 Shannon [name] [id number] 1966-02-04 58 y.o.    Subjective:   Patient ID:  Shannon [name] is a 58 y.o. (DOB 03/08/1966) female.  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  seen November 2020 .  No meds were changed at that time.  10/16/19 the following is noted at this appt: Asked questions about sleep supplement.   Just started GI doctor.   Good overall. No mood swings. No depressive episodes.  Cry more with hormonal changes more easily.   Probably sleep too much to avoid.  Occ stress from work interferes with sleep.  Not as productive from home.  Hopes to get back to the office.  Still working from home.  Boss noticed her forgetfulness with deadlines and missing issues in emails.  Used to be sharper. Wonders if related to meds.  Can usually nap if desired for years and not seasonal.  Only anxiety is Covid related. More poor self care.   Hysterectomy December 2020. No med changes  03/18/2020 appointment with the following noted: July had Covid and both parents and her father died from [place]. Able to help mom with arrangements. Required one iron infusion this year. Never took NAC bc it was too hard to take. Sleep, eating schedule is irregular. More productive at home helps her feel better. Plan: No med changes. 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 For a month had dizzy, falls, NV all of which cleared quickly after removal.  Radiation to brain after this. More CA in lungs and abdomen and having chemo.  Oncologist said she has about 5 years of lifespan left.  MRI and CT  pending. Gotten into therapy starting Norleen Rodenbaugh next week.   Pray a lot and good connection with friends.  Good support.  Trying to do things for fun.   Is working but finding it difficult mentally and physically DT SE fatigue from chemo. Considering LT disability but $ concerns.   If anxious trouble falling asleep.   Works from home.   D 58 yo.   Plan: No med changes except added modafinil   11/26/2020 appointment with the following noted: Added modafinil  and didn't notice much from it. Sleep average about 7 hours.  Would prefer 10 hours and may nap here and there in week of chemo even on chemo.  Had to get it at [facility] with [facility]. No dx of OSA. Last 2 weeks a little low emotionally and mentally after up for a couple of weeks with company and it wore her out. Sleepiness is normally worse than tiredness. Only situational mood effects. Plan: No benefit 200 mg modafinil  nor SE therefore Increase Modafinil  to 300 mg daily for excessive sleepiness  04/21/2021 appointment with the following noted: Still dealing with chemotx for colon cancer Thinks increase modafinil  questionable but misses it more than takes it. No SE. Things are getting harder rough month with more depression and anxiety related to cancer and daughter.  Work is hard.  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  Missing deadlines.   Needs to call social security. D  56 yo and in therapy with Medford Fischer.  So angry with patient. Has been able to stay on branded Seroquel . Tumors on CT scan  are shrinking recently. Plan: HX RELAPSE PSYCHOSIS WITH GENERIC QUETIAPINE , Continue branded Seroquel  800 mg HS and lamotrigine  150 mg daily and Luvox  300 mg daily Vraylar  1.5 mg daily for 2 weeks, Then if tolerated increase to 3 mg daily. If mood improves then reduce Seroquel  to 1 and 1/2 tablets.  06/06/2021 appt noted: I like it.  More clear headed and better energy.  Missed a few bc out of samples. Reduced  Seroquel  600 mg HS Less depressed.  Especially right after starting and others noticed the benefit. Plan : Continue lamotrigine  150 mg daily and Luvox  300 mg daily Vraylar  3 mg daily. Reduce Seroquel  to 400 mg nightly for 2 weeks,  Then reduce Seroquel  to 200 mg nightly for 2 weeks,  Then reduce to 100 mg for 2 weeks, Then reduce to 50 mg 2 weeks then stop if you can still sleep ok  08/07/2021 appointment with the following noted: Continues Vraylar  3 mg and Luvox  300 mg daily.  Reduced Seroquel  to 200 mg nightly and takes Ambien  5 mg nightly Went slowly down with Seroquel . Doing pretty good.  Some night hard to go to sleep but usally ok.   Still please with Vraylar  less sedated. No mood swings.  Some struggle with depression with reduced motivation and enjoyment.  Sometimes bored and would rather go to bed.  Don't want to read or watch TV.   Can follow a show but doesn't find it intersting.  CT scan show tumors shrinking. Plan: Continue lamotrigine  150 mg daily and Luvox  300 mg daily Reduce Vraylar  3 mg every other day to see If depression energy and interest and enjoyment is better Reduce Seroquel  to 150 for 2 weeks Then reduce to 100 mg for 2 weeks, Then reduce to 50 mg 2 weeks then stop if you can still sleep ok Wants to have access to Ambien  Plan: No benefit 200 mg modafinil  nor SE therefore OK continue trial  Modafinil  to 300 mg daily for excessive sleepiness  10/30/2021 appointment with the following noted: Reduced quetiapine  to 100 mg and trouble sleeping so back to 150 mg HS Fine with reduced Vraylar  to 3 mg QOD Increase modafinil  to 300 mg and is more alert and more active. No SE Overall mood is ok but still likes to resto often but not sleeping as much.  Not sure if it is chemo related.  Will rest after activity. At night sleeps 10 hours. Still hard to enjoy things and part of reason will lay down bc is bored.   Changed TV cable and not watching much now.  Can't concentrate to  read well.  Enjoys dog.  Enjoying some projects at home.  Patient denies difficulty with sleep maintenance.   Denies appetite disturbance.  Patient reports that motivation have been good.  Patient denies any suicidal ideation.  Still too monotone. This is last week at work.  Not doing well at work DT concentration.  Will apply for disability. Ativan  only every few weeks. Plan: Continue lamotrigine  150 mg daily  Continue Vraylar  3 mg every other day  Try again to Reduce Seroquel  to 100 mg for 2 weeks, Then reduce to 50 mg 2 weeks then stop if you can still sleep ok Wants to have access to Ambien  Reduce Luvox  to 2 and 1/2 tablets Start Wellbutrin  in the AM 1 for 1  week then 2 or 300 mg AM  12/22/21 appt noted: Reduced quetiapine  to 50 and was OK but when stopped couldn't sleep.  Less daytime drowsy with less.   Reduced fluvoxamine  to 250 and started Wellbutrin  XL 300 mg AM. More even keel level and less down.  More content.  More steady.  Less teary and emotional. No SE problems with change. ? Modafinil  in hopes of better concentration and clarity but ? Effect. Has some trouble with conc but it varies. No increase anxiety or jitteriness. Plan: Continue lamotrigine  150 mg daily  Continue Vraylar  3 mg every other day  Try again to Reduce Seroquel  to 25-50 mg HS Wants to have access to Ambien  Continue Luvox  to 2 and 1/2 tablets Increase Wellbutrin  to 450 mg AM residual depression anhedonia  03/11/2022 appointment noted Increase Wellbutrin  to 450 mg AM residual depression anhedonia is partially better.  Not as heavy and cloudy.  Energy a little better until last 2 chemos wiped her out more with nausea for 6 days.  Chemo every 3 weeks. Enjoys her dog and helps her get up.   No SE with Wellbutrin .   Minimal anxiety usually.  Except a couple of days per week.  Anxiety over bills.   No sig obsessions.   Problems with daughter are stressful.  D drinks too much and sleeps all day and has social  problems and irresponsibility. D Not working.  She is 58 yo and very needy and childlike emotionally. Reduced Seroquel  to 25 mg HS and willing to try to stop.  Avg 10 hour and some naps. But no longer hangover without much Seroquel . Has taken more Ambien . Plan: HX RELAPSE PSYCHOSIS WITH GENERIC QUETIAPINE , Continue lamotrigine  150 mg daily  Continue Vraylar  3 mg every other day  Try again to Reduce Seroquel  if possible to 0 or  25 mg HS Wants to have access to Ambien  Continue Luvox  to 2 and 1/2 tablets Continue  Wellbutrin  to 450 mg AM residual depression anhedonia OK continue Modafinil  to 300 mg daily for excessive sleepiness and cancer related fatigue  10/15/22 appt noted: Meds as above and off Seroquel .  Rare Ambien .    No sig SE  Lately sleeping ok.  Mouth guard helped clenching of mouth.  Occ night of barely sleeping but better with mouth guard. Mood is ok . Anxiety high DT stressors.  Talking with friends family helps with stress.   Primary stressors CA and D 58 yo.   Disc sleep.   No sig obsessions.  No panic she can't handle.   Plan: Asks about trying to reduce some of the meds.  Disc options of fluvoxamine  vs lamotrigine . Reduce lamotrigine  to 1 of the 100 mg tablets for 1 month then reduce to 1/2 of the 100 mg for 2 weeks and if you feel ok stop it. Continue Vraylar  3 mg every other day  Off Seroquel  Wants to have access to Ambien  Continue Luvox  to 2 and 1/2 tablets Continue  Wellbutrin  to 450 mg AM residual depression anhedonia OK continue Modafinil  to 300 mg daily for excessive sleepiness and cancer related fatigue  01/19/23 appt noted: Couldn't tell a difference off the lamotrigine . Meds: fluvox 250, lorazepam  0.5 rare, modafinil  300, Vraylar  3 mg every other day, Wellbutrin  XL 450 AM, off lamotrigine . No Ambien  Pretty good, stable, even keel.  Mild dep sx chronically.   No sig panic or anxity. Sleep variable.  Trouble staying asleep.  Naps an hour or more twice daily. 5-6  hours  sleep at night.  Still feels drowsy in the day and even driving.   No SE Plan: Continue Vraylar  3 mg every other day  Wants to have access to Ambien  but doesn't keep her asleep.  Continue Luvox  to 2 and 1/2 tablets Continue  Wellbutrin  to 450 mg AM residual depression anhedonia OK continue Modafinil  to 300 mg daily for excessive sleepiness and cancer related fatigue Disc EFA, trial trazodone  50-100 mg HS  05/20/23 appt noted: Meds as above: trazodone  50 HS, others as noted. Still some nights of not sleeping even with trazodone  every 2-3 weeks. Overall pretty good but still some dep and anxiety over the cancer and life.  Cancer treatment effectiveness is going well. Needs to do better to find things that engage her mind and the dep is holding her back in that area. D is still a big stress and is dependent onher.  Plan: DC Wellbutrin  and trial Auvelity  1 Am and then BID for residual depression anhedonia  07/22/23 appt noted: Meds:  fluvox 250, lorazepam  0.5 rare, modafinil  300, Vraylar  3 mg every other day, Wellbutrin  XL 450 AM, off lamotrigine . No Ambien . Trazodone  100 mg HS Never took Auvelity  bc cost  $1000.   I feel a little better lately, lighter.  Still a lot of anxiety about things.  Dep a little easier.  Not normal interest but better.  Is willing to try Auvelity  if cost can be avoided.  Hard to read DT inattention.  Some social activity.   Physically feels beaten down and tired.  Stumble a lot. Poor appetite and can't eat much.   Takes awhile to fall asleep.  11-4 then awakens and back to sleep until 12. No other sig changes. Extended chemo sessions bc good CT scans.  Pending scan from today.  No difference in SE with them spread out.  Plan: DC Wellbutrin  and trial Auvelity  1 Am and then BID for residual depression anhedonia.  If not helpful switch back.  If helpful will need to use generic products.    09/14/23 appt noted: Meds:  fluvox 250, lorazepam  0.5 rare, modafinil  300,  Vraylar  3 mg every other day, Auvelity  BID, off lamotrigine . No Ambien . Trazodone  100 mg HS Will run out today.  Auvelity  PA approved but cost might be a barrier.  Elevated mood with Auvelity  and more calm off Wellbtrin too.   No SE.   Other meds the same.  Sleep pretty good with trazodone .  Satisfied.   Plan no changes  12/06/23 appt noted: Meds:  fluvox 250, lorazepam  0.5 rare, modafinil  300, Vraylar  3 mg every other day, Auvelity  BID, off lamotrigine . No Ambien . Trazodone  100 mg HS Pretty good.  Good mood almost all the time.  More productive and active. Given olanzapine  for nausea. Hasn't started yet.  Doesn't eat very much bc N but only lost 5 # lately.   D seeking pregnancy.  Some issues with that.   Plan: Disc stopping Vraylar  3 mg every other day .  She feels ok about stopping it.  If dep worsens resume it.   Wants to have access to Ambien  but doesn't keep her asleep.  Continue Luvox  to 2 and 1/2 tablets Better mood and anxiety with DC Wellbutrin  and trial Auvelity  1 Am and then BID for residual depression anhedonia.     If helpful will possibly  need to use generic products.  This was discussed.  But PA approved. OK continue Modafinil  to 300 mg daily for excessive sleepiness and cancer related  fatigue trazodone  50-100 mg HS  02/08/24 TC:  Pt will have Medicare in Dec and is worried about cost of meds - Modafinil  and Auvelity . She is asking for alternatives to these.     MD resp:  There is no cheaper alternative to modafinil .  It can be purchased with [facility] for about $20-25/month.  Re: Auvelity , it is complicated but simplest thing is to switch back to Wellbutrin  but she has an appt with me before the switch, so we should wait until then to make any changes.  Shannon Macintosh, MD, DFAPA      04/11/24 appt noted:  Med: fluvox 250, lorazepam  0.5 rare, modafinil  300, No Vraylar , Auvelity  BID, off lamotrigine . No Ambien . Trazodone  100 mg HS Difficult time mentally and emotionally mostly  DT disease progression and SE.  Need more lorazepam  for marked anxiety.  Not worse from stopping Vraylar .   No SE. Notices rubbing teeth with tongue and wonders about TD More trouble sleeping with initial and terminal.  Mostly staying asleep and anxious when awakens. Dec 1 switch to [facility] and copay on Auvelity  and Modafinil  will be high.  Can tell a difference with lighter mood and more clarity with Auvelity . She thinks it is MCR advantage (health team advantage).   Worry a lot about D, $, decisions around insurance and policies.  No other OC sx. 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   D 58 yo and having hard time dealing with her CA.  Having end of life conversations.  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   Past Psychiatric Medication Trials:  HX RELAPSE PSYCHOSIS WITH GENERIC QUETIAPINE ,  Seroquel  800, Risperdal 2, inVega 3, Abilify 15, Vraylar  3 Citalopram, fluoxetine 80, sertraline to 50, fluvoxamine  300 mg daily Wellbutrin  Auvleity better than Wellbutrin  lamotrigine  200,  Modafinil  300 Ambien . No Lunesta,  Trazodone  helped Has been under the care of the psychiatrist since March 2000  D seeing Medford Fischer  Review of Systems:  Review of Systems  Constitutional:  Positive for fatigue.  HENT:  Negative for rhinorrhea.   Eyes:  Negative for discharge.  Cardiovascular:  Negative for palpitations.  Gastrointestinal:  Positive for constipation and nausea. Negative for abdominal distention.  Neurological:  Positive for weakness. Negative for tremors.       No history of SZ  Psychiatric/Behavioral:  Positive for decreased concentration. Negative for dysphoric mood. The patient is nervous/anxious.     Medications: I have reviewed the patient's current medications.  Current Outpatient Medications  Medication Sig Dispense Refill   acetaminophen  (TYLENOL ) 325 MG tablet Take 2 tablets (650 mg total) by mouth every 6 (six) hours as needed for  mild pain.     atorvastatin  (LIPITOR) 10 MG tablet Take by mouth.     CONTOUR NEXT TEST test strip 3 (three) times daily.     Dulaglutide  (TRULICITY ) 3 MG/0.5ML SOAJ Inject 3 mg into the skin once a week. 6 mL 1   hyoscyamine  (LEVSIN  SL) 0.125 MG SL tablet Place 1 tablet (0.125 mg total) under the tongue every 6 (six) hours as needed. 45 tablet 1   insulin  aspart (NOVOLOG  FLEXPEN) 100 UNIT/ML FlexPen Inject 8 Units into the skin 3 (three) times daily with meals. 15 mL 11   insulin  glargine (LANTUS ) 100 UNIT/ML Solostar Pen Inject 26 Units into the skin daily. 15 mL 11   levothyroxine  (SYNTHROID ) 75 MCG tablet Take 1 tablet (75 mcg total) by mouth daily. 30 tablet 0  LORazepam  (ATIVAN ) 0.5 MG tablet Take 1 tablet (0.5 mg total) by mouth every 8 (eight) hours as needed for anxiety. 30 tablet 3   magic mouthwash SOLN Take 10 mLs by mouth 3 (three) times daily as needed for mouth pain.     Multiple Vitamin (MULTI-VITAMIN) tablet Take 1 tablet by mouth daily.     NOVOFINE PEN NEEDLE 32G X 6 MM MISC SMARTSIG:Syringe(s) SUB-Q     OLANZapine  (ZYPREXA ) 5 MG tablet Take 1 tablet (5 mg total) by mouth at bedtime. 7 tablet 1   omeprazole (PRILOSEC) 40 MG capsule Take 40 mg by mouth daily.     ondansetron  (ZOFRAN ) 4 MG tablet Take 1 tablet (4 mg total) by mouth every 4 (four) hours as needed for nausea. 90 tablet 3   ondansetron  (ZOFRAN -ODT) 4 MG disintegrating tablet Take 1 tablet (4 mg total) by mouth every 4 (four) hours as needed for nausea or vomiting. Dissolve on tongue 90 tablet 3   promethazine  (PHENERGAN ) 25 MG tablet Take 1 tablet (25 mg total) by mouth every 6 (six) hours as needed for nausea. 30 tablet 3   senna-docusate (SENOKOT-S) 8.6-50 MG tablet Take 2 tablets by mouth at bedtime.     sucralfate (CARAFATE) 1 g tablet Take 1 g by mouth 4 (four) times daily.     trifluridine-tipiracil (LONSURF) 20-8.19 MG tablet Take 3 tablets (60 mg of trifluridine total) by mouth 2 (two) times daily after a  meal. Take within 1 hr after AM & PM meals on days 1-5, 8-12. Repeat every 28 days. 60 tablet 2   Dextromethorphan-buPROPion  ER (AUVELITY ) 45-105 MG TBCR Take 1 tablet by mouth 2 (two) times daily. 180 tablet 0   fluvoxaMINE  (LUVOX ) 100 MG tablet Take 3 tablets (300 mg total) by mouth at bedtime. 270 tablet 0   modafinil  (PROVIGIL ) 200 MG tablet TAKE 1 & 1/2 (ONE & ONE-HALF) TABLETS BY MOUTH ONCE DAILY 135 tablet 1   traZODone  (DESYREL ) 100 MG tablet Take 1 tablet (100 mg total) by mouth at bedtime. 90 tablet 0   No current facility-administered medications for this visit.    Medication Side Effects: None  Allergies:  Allergies  Allergen Reactions   Leucovorin  Hives, Rash and Other (See Comments)    Redness on face and neck.  Confirmed that it was the leucovorin  causing this on 11/19/21.  We are deleting this from future plan.   Clindamycin/Lincomycin Rash   Irinotecan  Rash   Penicillins Rash    Reaction: 10 years    Past Medical History:  Diagnosis Date   Anemia    Iron De   Anxiety    Blood clot in vein    Bowel obstruction (HCC)    Colon cancer (HCC) 2018   treated with surgery and chemotherapy   Depression    Diabetes mellitus without complication (HCC)    Type II   DVT (deep venous thrombosis) (HCC) 2019   behind right knee- while she wasa on chemo   GERD (gastroesophageal reflux disease)    History of blood transfusion    History of chemotherapy    History of kidney stones 2012   passed   Hypothyroidism    Obesity     Family History  Problem Relation Age of Onset   Irritable bowel syndrome Mother    Colon polyps Father    Breast cancer Maternal Grandmother    Diabetes Maternal Grandmother    Diabetes Maternal Grandfather    Breast cancer Paternal Grandmother  Colon polyps Maternal Uncle    Stomach cancer Neg Hx    Pancreatic cancer Neg Hx    Esophageal cancer Neg Hx    Colon cancer Neg Hx     Social History   Socioeconomic History   Marital  status: Widowed    Spouse name: Not on file   Number of children: Not on file   Years of education: Not on file   Highest education level: Not on file  Occupational History   Occupation: care coordinator   Tobacco Use   Smoking status: Never   Smokeless tobacco: Never  Vaping Use   Vaping status: Never Used  Substance and Sexual Activity   Alcohol use: Yes    Comment: rare   Drug use: Never   Sexual activity: Not Currently  Other Topics Concern   Not on file  Social History Narrative   Not on file   Social Drivers of Health   Financial Resource Strain: Not on file  Food Insecurity: Not on file  Transportation Needs: Not on file  Physical Activity: Not on file  Stress: Not on file  Social Connections: Not on file  Intimate Partner Violence: Not on file    Past Medical History, Surgical history, Social history, and Family history were reviewed and updated as appropriate.   Please see review of systems for further details on the patient's review from today.   Objective:   Physical Exam:  LMP 02/22/2019 (Approximate)   Physical Exam Constitutional:      General: She is not in acute distress. Musculoskeletal:        General: No deformity.  Neurological:     Mental Status: She is alert and oriented to person, place, and time.     Coordination: Coordination normal.     Comments: AIM 0 but maybe slight lip pursing .  No tongue movements noted in office.  Psychiatric:        Attention and Perception: Attention and perception normal. She does not perceive auditory or visual hallucinations.        Mood and Affect: Mood is anxious and depressed. Affect is not labile, blunt or inappropriate.        Speech: Speech normal. Speech is not rapid and pressured.        Behavior: Behavior normal.        Thought Content: Thought content normal. Thought content is not paranoid or delusional. Thought content does not include homicidal or suicidal ideation. Thought content does not  include suicidal plan.        Cognition and Memory: Cognition and memory normal.        Judgment: Judgment normal.     Comments: Insight intact Less anhedonia and depression with switch to Auvelity  but more anxious with illness progression. Some thoughts of wanting to be gone to escape this but no SI     Lab Review:     Component Value Date/Time   NA 140 03/13/2024 1020   NA 138 03/15/2023 0000   K 4.5 03/13/2024 1020   CL 104 03/13/2024 1020   CO2 26 03/13/2024 1020   GLUCOSE 180 (H) 03/13/2024 1020   BUN 16 03/13/2024 1020   BUN 16 03/15/2023 0000   CREATININE 1.23 (H) 03/13/2024 1020   CALCIUM  9.1 03/13/2024 1020   PROT 5.8 (L) 03/13/2024 1020   ALBUMIN 3.2 (L) 03/13/2024 1020   AST 51 (H) 03/13/2024 1020   ALT 40 03/13/2024 1020   ALKPHOS 307 (H) 03/13/2024 1020  BILITOT 0.5 03/13/2024 1020   GFRNONAA 51 (L) 03/13/2024 1020   GFRNONAA 52 10/23/2022 0000       Component Value Date/Time   WBC 1.2 (L) 04/10/2024 1319   WBC 4.8 06/07/2020 0525   RBC 2.37 (L) 04/10/2024 1319   HGB 7.8 (L) 04/10/2024 1319   HCT 25.1 (L) 04/10/2024 1319   PLT 13 (L) 04/10/2024 1319   MCV 105.9 (H) 04/10/2024 1319   MCV 101 (A) 01/04/2023 0000   MCH 32.9 04/10/2024 1319   MCHC 31.1 04/10/2024 1319   RDW 19.8 (H) 04/10/2024 1319   LYMPHSABS 0.5 (L) 04/10/2024 1319   MONOABS 0.3 04/10/2024 1319   EOSABS 0.0 04/10/2024 1319   BASOSABS 0.0 04/10/2024 1319    No results found for: POCLITH, LITHIUM   No results found for: PHENYTOIN, PHENOBARB, VALPROATE, CBMZ   .res Assessment: Plan:    Severe recurrent major depression with psychotic features (HCC) - Plan: modafinil  (PROVIGIL ) 200 MG tablet, Dextromethorphan-buPROPion  ER (AUVELITY ) 45-105 MG TBCR, DISCONTINUED: Dextromethorphan-buPROPion  ER (AUVELITY ) 45-105 MG TBCR, DISCONTINUED: Dextromethorphan-buPROPion  ER (AUVELITY ) 45-105 MG TBCR  Mixed obsessional thoughts and acts - Plan: fluvoxaMINE  (LUVOX ) 100 MG  tablet  Delayed sleep phase syndrome - Plan: traZODone  (DESYREL ) 100 MG tablet  Neoplastic malignant related fatigue - Plan: modafinil  (PROVIGIL ) 200 MG tablet  Mild cognitive impairment - Plan: modafinil  (PROVIGIL ) 200 MG tablet   30 min Overall mood and anxiety are better managed with Auvelity .  But worse anxiety and dep with dz progression.   Wants to have access to Ambien  but doesn't keep her asleep.   Continue Luvox  to 2 and 1/2 tablets  Better mood and anxiety with DC Wellbutrin  and trial Auvelity  1 Am and then BID for residual depression anhedonia.     If helpful will possibly  need to use generic products.  This was discussed.  But PA approved.  OK continue but use prn Modafinil  to 300 mg daily for excessive sleepiness and cancer related fatigue  trazodone  100 mg HS Option Lunesta alternative  Discussed potential metabolic side effects associated with atypical antipsychotics, as well as potential risk for movement side effects. Advised pt to contact office if movement side effects occur.  ? Mild withdrawal dyskinesia.  Ok to increase lorazepam  for incr anxiety re: illness. We discussed the short-term risks associated with benzodiazepines including sedation and increased fall risk among others.  Discussed long-term side effect risk including dependence, potential withdrawal symptoms, and the potential eventual dose-related risk of dementia.  But recent studies from 2020 dispute this association between benzodiazepines and dementia risk. Newer studies in 2020 do not support an association with dementia.  FU 3 mos  Shannon Macintosh, MD, DFAPA   Please see After Visit Summary for patient specific instructions.  Future Appointments  Date Time Provider Department Center  04/12/2024 10:00 AM [facility] BED 1 [facility] None  04/17/2024 11:45 AM Ezzard, Dequincy A, MD [facility] None  04/17/2024 12:15 PM [facility] INFUSION CHAIR 3 [facility] None  07/12/2024  2:30 PM Cottle, Shannon KANDICE Raddle., MD  CP-CP None  07/20/2024  1:00 PM GI-315 MR 3 GI-315MRI [facility]  07/24/2024  7:00 AM [facility]OR BOARD CONFERENCE [facility] None  07/25/2024 12:00 PM Vaslow, Zachary K, MD [facility] None    No orders of the defined types were placed in this encounter.      -------------------------------

## 2024-04-12 ENCOUNTER — Inpatient Hospital Stay

## 2024-04-12 ENCOUNTER — Encounter: Payer: Self-pay | Admitting: Oncology

## 2024-04-12 DIAGNOSIS — D509 Iron deficiency anemia, unspecified: Secondary | ICD-10-CM

## 2024-04-12 DIAGNOSIS — Z5111 Encounter for antineoplastic chemotherapy: Secondary | ICD-10-CM | POA: Diagnosis not present

## 2024-04-12 DIAGNOSIS — C183 Malignant neoplasm of hepatic flexure: Secondary | ICD-10-CM

## 2024-04-12 MED ORDER — ACETAMINOPHEN 325 MG PO TABS
650.0000 mg | ORAL_TABLET | Freq: Once | ORAL | Status: AC
  Administered 2024-04-12: 650 mg via ORAL
  Filled 2024-04-12: qty 2

## 2024-04-12 MED ORDER — DIPHENHYDRAMINE HCL 25 MG PO CAPS
25.0000 mg | ORAL_CAPSULE | Freq: Once | ORAL | Status: AC
  Administered 2024-04-12: 25 mg via ORAL
  Filled 2024-04-12: qty 1

## 2024-04-12 MED ORDER — SODIUM CHLORIDE 0.9% IV SOLUTION
250.0000 mL | INTRAVENOUS | Status: DC
  Administered 2024-04-12 (×3): 250 mL via INTRAVENOUS

## 2024-04-12 NOTE — Patient Instructions (Signed)
 Getting Platelets Through an IV (Platelet Transfusion) in Adults: What to Expect A platelet transfusion is when a person gets platelets from another person (donor) through an IV. Platelets are parts of blood that stick together and form a clot to help stop bleeding after an injury. If you don't have enough platelets, you might bleed or bruise easily. You may need a platelet transfusion if you have a health problem that causes a low number of platelets, such as thrombocytopenia. A platelet transfusion may be used to stop or prevent too much bleeding. Tell a health care provider about: Any reactions you've had during past transfusions. Any allergies you have. All medicines you take. These include vitamins, herbs, eye drops, and creams. Any bleeding problems you have. Any surgeries you've had. Any health problems you have. Whether you're pregnant or may be pregnant. What are the risks? Your health care provider will talk with you about risks. These may include: Fever or chills. This might happen if your body reacts to the new cells. It can happen during or up to 4 hours after the transfusion. A mild allergy. You might get red, swollen areas on your skin (hives). You might feel itchy. A bad allergy. You might have trouble breathing or swelling around your face and lips. Very bad reactions are rare but can happen. These may be: Infection. This is rare because donated blood is carefully tested. Hemolytic reaction. This is when the defense system (immune system) of your body attacks the new cells. Symptoms include fever, chills, and a feeling that you may throw up. You may also have low blood pressure and pain in your chest or lower back. Transfusion-associated circulatory overload (TACO). This happens if fluids move to your lungs. This may cause breathing problems. Transfusion-related acute lung injury (TRALI). TRALI can cause breathing problems and low oxygen in your blood. This can happen within  hours or days after the transfusion. Transfusion-associated graft-versus-host disease (TAGVHD). This happens when donated cells attack the body's healthy tissues. What happens before? Take your medicines only as told. You will have a blood test to find out your blood type. This helps match the donor blood with your blood. If you've had an allergy to a transfusion in the past, you may be given medicine to help prevent another allergy. Your temperature, blood pressure, pulse, and breathing will be checked. What happens during platelet transfusion?  An IV will be put into a vein in your hand or arm. For your safety, two health care team members will check your identity and the donor platelets. The bag of donor platelets will be connected to your IV. The platelets will flow into your blood. This usually takes 30-60 minutes. Your temperature, blood pressure, pulse, and breathing will be checked. This helps catch signs of a reaction early. You will be watched for symptoms of all types of reactions, like chills, hives, or itching. If you show signs of a reaction, the transfusion will be stopped. You may be given medicine to help treat the reaction. When your transfusion is done, your IV will be removed. Pressure may be put on the IV site for a few minutes to stop any bleeding. The IV site will be covered with a bandage. These steps may vary. Ask what you can expect. What happens after? You will be watched closely until you leave. This includes checking your blood pressure, temperature, pulse rate, and breathing rate again. This information is not intended to replace advice given to you by your health care  provider. Make sure you discuss any questions you have with your health care provider. Document Revised: 09/03/2023 Document Reviewed: 09/03/2023 Elsevier Patient Education  2025 Elsevier Inc.Blood Transfusion, Adult, Care After After a blood transfusion, it is common to have: Bruising and  soreness at the IV site. A headache. Follow these instructions at home: Your doctor may give you more instructions. If you have problems, contact your doctor. Insertion site care     Follow instructions from your doctor about how to take care of your insertion site. This is where an IV tube was put into your vein. Make sure you: Wash your hands with soap and water for at least 20 seconds before and after you change your bandage. If you cannot use soap and water, use hand sanitizer. Change your bandage as told by your doctor. Check your insertion site every day for signs of infection. Check for: Redness, swelling, or pain. Bleeding from the site. Warmth. Pus or a bad smell. General instructions Take over-the-counter and prescription medicines only as told by your doctor. Rest as told by your doctor. Go back to your normal activities as told by your doctor. Keep all follow-up visits. You may need to have tests at certain times to check your blood. Contact a doctor if: You have itching or red, swollen areas of skin (hives). You have a fever or chills. You have pain in the head, back, or chest. You feel worried or nervous (anxious). You feel weak after doing your normal activities. You have any of these problems at the insertion site: Redness, swelling, warmth, or pain. Bleeding that does not stop with pressure. Pus or a bad smell. If you received your blood transfusion in an outpatient setting, you will be told whom to contact to report any reactions. Get help right away if: You have signs of a serious reaction. This may be coming from an allergy or the body's defense system (immune system). Signs include: Trouble breathing or shortness of breath. Swelling of the face or feeling warm (flushed). A widespread rash. Dark pee (urine) or blood in the pee. Fast heartbeat. These symptoms may be an emergency. Get help right away. Call 911. Do not wait to see if the symptoms will go  away. Do not drive yourself to the hospital. Summary Bruising and soreness at the IV site are common. Check your insertion site every day for signs of infection. Rest as told by your doctor. Go back to your normal activities as told by your doctor. Get help right away if you have signs of a serious reaction. This information is not intended to replace advice given to you by your health care provider. Make sure you discuss any questions you have with your health care provider. Document Revised: 08/15/2021 Document Reviewed: 08/15/2021 Elsevier Patient Education  2024 [employer].

## 2024-04-13 LAB — PREPARE PLATELET PHERESIS
Unit division: 0
Unit division: 0

## 2024-04-13 LAB — BPAM RBC
Blood Product Expiration Date: 202512032359
ISSUE DATE / TIME: 202511120707
Unit Type and Rh: 202512032359
Unit Type and Rh: 6200

## 2024-04-13 LAB — TYPE AND SCREEN
ABO/RH(D): A POS
Antibody Screen: POSITIVE
Donor AG Type: NEGATIVE
PT AG Type: NEGATIVE
Unit division: 0

## 2024-04-13 LAB — BPAM PLATELET PHERESIS
Blood Product Expiration Date: 202511142359
Blood Product Unit Number: 202511122359
ISSUE DATE / TIME: 202511120624
PRODUCT CODE: 202511120650
PRODUCT CODE: 202511142359
Unit Type and Rh: 6200
Unit Type and Rh: 202511122359
Unit Type and Rh: 5100
Unit Type and Rh: 6200

## 2024-04-14 DIAGNOSIS — C189 Malignant neoplasm of colon, unspecified: Secondary | ICD-10-CM | POA: Diagnosis not present

## 2024-04-14 DIAGNOSIS — C787 Secondary malignant neoplasm of liver and intrahepatic bile duct: Secondary | ICD-10-CM | POA: Diagnosis not present

## 2024-04-14 LAB — LAB REPORT - SCANNED: EGFR: 53

## 2024-04-16 NOTE — Progress Notes (Signed)
 [facility] [address] [place],  [place]  [address] [phone]    Clinic Day:  04/17/2024  Referring physician: Glenford Harlene SAILOR, NP  HISTORY OF PRESENT ILLNESS:  The patient is a 58 y.o. female with metastatic colon cancer, which includes spread of disease to her abdominal cavity, lungs, brain, liver, and right adrenal gland.  She comes in today to be evaluated before heading into her 2nd cycle of Lonsurf /Avastin , as well as to go over her CT scan images.  The scans were ordered due to her having physical exam evidence of ascites potentially being present.  Since her last visit, the patient has been doing okay.  She still has a steady level of fatigue.  She denies having other significant changes in her health over this past week.  With respect to her colon cancer history, she is status post a right hemicolectomy in early October 2018, followed by 12 cycles of adjuvant FOLFOX chemotherapy, which were completed in April 2019.  In December 2021, she underwent a left cerebellar metastasectomy, whose pathology was consistent with metastatic colon cancer.  Tumor testing did come back MMR normal. CT scans revealed evidence of her cancer being in multiple locations, for which she took 40 cycles of FOLFIRI/Avastin .  As follow-up scans revealed liver metastasis, she was placed back on FOLFOX/Avastin .  She underwent 30 cycles of this regimen before scans showed disease progression.  This led to her recently being switched to Lonsurf /Avastin .  PHYSICAL EXAM:  Blood pressure (!) 144/82, pulse 90, temperature 98.1 F (36.7 C), temperature source Oral, resp. rate 14, height 5' 8 (1.727 m), weight 153 lb 8 oz (69.6 kg), last menstrual period 02/22/2019, SpO2 96%. Body mass index is 23.34 kg/m.  Performance status (ECOG): 1 - Symptomatic but completely ambulatory  Physical Exam Vitals and nursing note reviewed.   Constitutional:      General: She is not in acute distress.    Appearance: Normal appearance.     Comments:    HENT:     Head: Normocephalic and atraumatic.     Mouth/Throat:     Mouth: Mucous membranes are moist.     Pharynx: Oropharynx is clear. No oropharyngeal exudate or posterior oropharyngeal erythema.  Eyes:     General: No scleral icterus.    Extraocular Movements: Extraocular movements intact.     Conjunctiva/sclera: Conjunctivae normal.     Pupils: Pupils are equal, round, and reactive to light.  Cardiovascular:     Rate and Rhythm: Normal rate and regular rhythm.     Heart sounds: Normal heart sounds. No murmur heard.    No friction rub. No gallop.  Pulmonary:     Effort: Pulmonary effort is normal.     Breath sounds: Normal breath sounds. No wheezing, rhonchi or rales.  Abdominal:     General: There is no distension.     Palpations: Abdomen is soft. There is fluid wave. There is no hepatomegaly, splenomegaly or mass.     Tenderness: There is no abdominal tenderness.  Musculoskeletal:        General: Normal range of motion.     Cervical back: Normal range of motion and neck supple. No tenderness.     Right lower leg: No edema.     Left lower leg: No edema.  Lymphadenopathy:     Cervical: No cervical adenopathy.     Upper Body:     Right upper body: No supraclavicular or axillary adenopathy.  Left upper body: No supraclavicular or axillary adenopathy.     Lower Body: No right inguinal adenopathy. No left inguinal adenopathy.  Skin:    General: Skin is warm and dry.     Coloration: Skin is not jaundiced.     Findings: No rash.  Neurological:     Mental Status: She is alert and oriented to person, place, and time.     Cranial Nerves: No cranial nerve deficit.  Psychiatric:        Mood and Affect: Mood normal.        Behavior: Behavior normal.        Thought Content: Thought content normal.    LABS:      Latest Ref Rng & Units 04/17/2024   12:30 PM  04/10/2024    1:19 PM 03/27/2024    1:33 PM  CBC  WBC 4.0 - 10.5 K/uL 2.6  1.2  1.7   Hemoglobin 12.0 - 15.0 g/dL 9.8  7.8  8.1   Hematocrit 36.0 - 46.0 % 31.2  25.1  25.1   Platelets 150 - 400 K/uL 32  13  19       Latest Ref Rng & Units 04/17/2024   12:30 PM 03/13/2024   10:20 AM 02/15/2024    9:33 AM  CMP  Glucose 70 - 99 mg/dL 870  819  882   BUN 6 - 20 mg/dL 15  16  16    Creatinine 0.44 - 1.00 mg/dL 8.73  8.76  8.81   Sodium 135 - 145 mmol/L 141  140  140   Potassium 3.5 - 5.1 mmol/L 4.3  4.5  4.8   Chloride 98 - 111 mmol/L 106  104  103   CO2 22 - 32 mmol/L 24  26  26    Calcium  8.9 - 10.3 mg/dL 8.8  9.1  9.0   Total Protein 6.5 - 8.1 g/dL 5.5  5.8  6.1   Total Bilirubin 0.0 - 1.2 mg/dL 0.6  0.5  0.4   Alkaline Phos 38 - 126 U/L 440  307  302   AST 15 - 41 U/L 47  51  45   ALT 0 - 44 U/L 32  40  30     Latest Reference Range & Units 12/15/23 09:46 03/13/24 10:20 04/10/24 13:19  CEA ([facility]) 0.00 - 5.00 ng/mL 66.98 (H) 113.41 (H) 104.23 (H)  (H): Data is abnormally high  SCANS:  CT scans of her abdomen/pelvis on 04-14-24 revealed the following:     ASSESSMENT & PLAN:  Assessment/Plan:  A 58 y.o. female with metastatic colon cancer.  In clinic today, I went over all of her CT scan images with her.  From my vantage point, I do not see particularly worsening liver metastasis.  However, she clearly has ascites and peritoneal disease present, which had not been seen on previous scans.  I will arrange for her to undergo a paracentesis, whose ascitic fluid will be sent for cytology to determine if it is malignant in nature.  Of note, over the past month, her CEA level has actually fallen.  There is the possibility that her disease may be better despite her scans being worse since they were last checked in early October 2025.  For now, the plan will be to continue with her second cycle of Lonsurf /Avastin  today.  Due to her thrombocytopenia, her CBC will be rechecked next week to  ensure a plateletpheresis is not necessary.  Her cytopenias are undoubtedly related to her  Lonsurf , which will be decreased to 20 mg twice daily.  I will see this patient back in 4 weeks before she heads into her third cycle of Lonsurf /Avastin .  The patient understands all the plans discussed today and is in agreement with them.  Donneisha Beane DELENA Kerns, MD

## 2024-04-17 ENCOUNTER — Inpatient Hospital Stay (HOSPITAL_BASED_OUTPATIENT_CLINIC_OR_DEPARTMENT_OTHER): Admitting: Oncology

## 2024-04-17 ENCOUNTER — Inpatient Hospital Stay

## 2024-04-17 ENCOUNTER — Other Ambulatory Visit: Payer: Self-pay

## 2024-04-17 ENCOUNTER — Other Ambulatory Visit: Payer: Self-pay | Admitting: Oncology

## 2024-04-17 ENCOUNTER — Encounter: Payer: Self-pay | Admitting: Oncology

## 2024-04-17 VITALS — BP 144/82 | HR 90 | Temp 98.1°F | Resp 14 | Ht 68.0 in | Wt 153.5 lb

## 2024-04-17 DIAGNOSIS — C189 Malignant neoplasm of colon, unspecified: Secondary | ICD-10-CM

## 2024-04-17 DIAGNOSIS — C787 Secondary malignant neoplasm of liver and intrahepatic bile duct: Secondary | ICD-10-CM | POA: Diagnosis not present

## 2024-04-17 DIAGNOSIS — C183 Malignant neoplasm of hepatic flexure: Secondary | ICD-10-CM

## 2024-04-17 DIAGNOSIS — C182 Malignant neoplasm of ascending colon: Secondary | ICD-10-CM

## 2024-04-17 DIAGNOSIS — Z5111 Encounter for antineoplastic chemotherapy: Secondary | ICD-10-CM | POA: Diagnosis not present

## 2024-04-17 LAB — CMP (CANCER CENTER ONLY)
ALT: 32 U/L (ref 0–44)
AST: 47 U/L — ABNORMAL HIGH (ref 15–41)
Albumin: 3 g/dL — ABNORMAL LOW (ref 3.5–5.0)
Alkaline Phosphatase: 440 U/L — ABNORMAL HIGH (ref 38–126)
Anion gap: 11 (ref 5–15)
BUN: 15 mg/dL (ref 6–20)
CO2: 24 mmol/L (ref 22–32)
Calcium: 8.8 mg/dL — ABNORMAL LOW (ref 8.9–10.3)
Chloride: 106 mmol/L (ref 98–111)
Creatinine: 1.26 mg/dL — ABNORMAL HIGH (ref 0.44–1.00)
GFR, Estimated: 50 mL/min — ABNORMAL LOW (ref >60)
Glucose, Bld: 129 mg/dL — ABNORMAL HIGH (ref 70–99)
Potassium: 4.3 mmol/L (ref 3.5–5.1)
Sodium: 141 mmol/L (ref 135–145)
Total Bilirubin: 0.6 mg/dL (ref 0.0–1.2)
Total Protein: 5.5 g/dL — ABNORMAL LOW (ref 6.5–8.1)

## 2024-04-17 LAB — CBC WITH DIFFERENTIAL (CANCER CENTER ONLY)
Abs Immature Granulocytes: 0.01 K/uL (ref 0.00–0.07)
Basophils Absolute: 0 K/uL (ref 0.0–0.1)
Basophils Relative: 0 %
Eosinophils Absolute: 0 K/uL (ref 0.0–0.5)
Eosinophils Relative: 0 %
HCT: 31.2 % — ABNORMAL LOW (ref 36.0–46.0)
Hemoglobin: 9.8 g/dL — ABNORMAL LOW (ref 12.0–15.0)
Immature Granulocytes: 0 %
Lymphocytes Relative: 28 %
Lymphs Abs: 0.7 K/uL (ref 0.7–4.0)
MCH: 33.8 pg (ref 26.0–34.0)
MCHC: 31.4 g/dL (ref 30.0–36.0)
MCV: 107.6 fL — ABNORMAL HIGH (ref 80.0–100.0)
Monocytes Absolute: 0.3 K/uL (ref 0.1–1.0)
Monocytes Relative: 11 %
Neutro Abs: 1.6 K/uL — ABNORMAL LOW (ref 1.7–7.7)
Neutrophils Relative %: 61 %
Platelet Count: 32 K/uL — ABNORMAL LOW (ref 150–400)
RBC: 2.9 MIL/uL — ABNORMAL LOW (ref 3.87–5.11)
RDW: 20 % — ABNORMAL HIGH (ref 11.5–15.5)
WBC Count: 2.6 K/uL — ABNORMAL LOW (ref 4.0–10.5)
nRBC: 0 % (ref 0.0–0.2)

## 2024-04-17 LAB — TOTAL PROTEIN, URINE DIPSTICK: Protein, ur: NEGATIVE mg/dL

## 2024-04-17 MED ORDER — SODIUM CHLORIDE 0.9 % IV SOLN: INTRAVENOUS | Status: DC

## 2024-04-17 MED ORDER — SODIUM CHLORIDE 0.9 % IV SOLN
5.0000 mg/kg | Freq: Once | INTRAVENOUS | Status: AC
  Administered 2024-04-17: 300 mg via INTRAVENOUS
  Filled 2024-04-17: qty 12

## 2024-04-17 NOTE — Patient Instructions (Signed)
 Bevacizumab  Injection What is this medication? BEVACIZUMAB  (be va SIZ yoo mab) treats some types of cancer. It works by blocking a protein that causes cancer cells to grow and multiply. This helps to slow or stop the spread of cancer cells. It is a monoclonal antibody. This medicine may be used for other purposes; ask your health care provider or pharmacist if you have questions. COMMON BRAND NAME(S): Alymsys , Avastin , MVASI , Vegzalma, Zirabev  What should I tell my care team before I take this medication? They need to know if you have any of these conditions: Blood clots Coughing up blood Having or recent surgery Heart failure High blood pressure History of a connection between 2 or more body parts that do not usually connect (fistula) History of a tear in your stomach or intestines Protein in your urine An unusual or allergic reaction to bevacizumab , other medications, foods, dyes, or preservatives Pregnant or trying to get pregnant Breast-feeding How should I use this medication? This medication is injected into a vein. It is given by your care team in a hospital or clinic setting. Talk to your care team the use of this medication in children. Special care may be needed. Overdosage: If you think you have taken too much of this medicine contact a poison control center or emergency room at once. NOTE: This medicine is only for you. Do not share this medicine with others. What if I miss a dose? Keep appointments for follow-up doses. It is important not to miss your dose. Call your care team if you are unable to keep an appointment. What may interact with this medication? Interactions are not expected. This list may not describe all possible interactions. Give your health care provider a list of all the medicines, herbs, non-prescription drugs, or dietary supplements you use. Also tell them if you smoke, drink alcohol, or use illegal drugs. Some items may interact with your medicine. What  should I watch for while using this medication? Your condition will be monitored carefully while you are receiving this medication. You may need blood work while taking this medication. This medication may make you feel generally unwell. This is not uncommon as chemotherapy can affect healthy cells as well as cancer cells. Report any side effects. Continue your course of treatment even though you feel ill unless your care team tells you to stop. This medication may increase your risk to bruise or bleed. Call your care team if you notice any unusual bleeding. Before having surgery, talk to your care team to make sure it is ok. This medication can increase the risk of poor healing of your surgical site or wound. You will need to stop this medication for 28 days before surgery. After surgery, wait at least 28 days before restarting this medication. Make sure the surgical site or wound is healed enough before restarting this medication. Talk to your care team if questions. Talk to your care team if you may be pregnant. Serious birth defects can occur if you take this medication during pregnancy and for 6 months after the last dose. Contraception is recommended while taking this medication and for 6 months after the last dose. Your care team can help you find the option that works for you. Do not breastfeed while taking this medication and for 6 months after the last dose. This medication can cause infertility. Talk to your care team if you are concerned about your fertility. What side effects may I notice from receiving this medication? Side effects that you should  report to your care team as soon as possible: Allergic reactions--skin rash, itching, hives, swelling of the face, lips, tongue, or throat Bleeding--bloody or black, tar-like stools, vomiting blood or brown material that looks like coffee grounds, red or dark brown urine, small red or purple spots on skin, unusual bruising or bleeding Blood  clot--pain, swelling, or warmth in the leg, shortness of breath, chest pain Heart attack--pain or tightness in the chest, shoulders, arms, or jaw, nausea, shortness of breath, cold or clammy skin, feeling faint or lightheaded Heart failure--shortness of breath, swelling of the ankles, feet, or hands, sudden weight gain, unusual weakness or fatigue Increase in blood pressure Infection--fever, chills, cough, sore throat, wounds that don't heal, pain or trouble when passing urine, general feeling of discomfort or being unwell Infusion reactions--chest pain, shortness of breath or trouble breathing, feeling faint or lightheaded Kidney injury--decrease in the amount of urine, swelling of the ankles, hands, or feet Stomach pain that is severe, does not go away, or gets worse Stroke--sudden numbness or weakness of the face, arm, or leg, trouble speaking, confusion, trouble walking, loss of balance or coordination, dizziness, severe headache, change in vision Sudden and severe headache, confusion, change in vision, seizures, which may be signs of posterior reversible encephalopathy syndrome (PRES) Side effects that usually do not require medical attention (report to your care team if they continue or are bothersome): Back pain Change in taste Diarrhea Dry skin Increased tears Nosebleed This list may not describe all possible side effects. Call your doctor for medical advice about side effects. You may report side effects to FDA at 1-800-FDA-1088. Where should I keep my medication? This medication is given in a hospital or clinic. It will not be stored at home. NOTE: This sheet is a summary. It may not cover all possible information. If you have questions about this medicine, talk to your doctor, pharmacist, or health care provider.  2024 Elsevier/Gold Standard (2021-10-03 00:00:00) [facility] [place] - A DEPT OF [facility]  Discharge Instructions: Thank you for choosing [facility] to provide your oncology and hematology care.  If you have a lab appointment with the Cancer Center, please go directly to the Cancer Center and check in at the registration area.   Wear comfortable clothing and clothing appropriate for easy access to any Portacath or PICC line.   We strive to give you quality time with your provider. You may need to reschedule your appointment if you arrive late (15 or more minutes).  Arriving late affects you and other patients whose appointments are after yours.  Also, if you miss three or more appointments without notifying the office, you may be dismissed from the clinic at the provider's discretion.      For prescription refill requests, have your pharmacy contact our office and allow 72 hours for refills to be completed.    Today you received the following chemotherapy and/or immunotherapy agents bevacizumab       To help prevent nausea and vomiting after your treatment, we encourage you to take your nausea medication as directed.  BELOW ARE SYMPTOMS THAT SHOULD BE REPORTED IMMEDIATELY: *FEVER GREATER THAN 100.4 F (38 C) OR HIGHER *CHILLS OR SWEATING *NAUSEA AND VOMITING THAT IS NOT CONTROLLED WITH YOUR NAUSEA MEDICATION *UNUSUAL SHORTNESS OF BREATH *UNUSUAL BRUISING OR BLEEDING *URINARY PROBLEMS (pain or burning when urinating, or frequent urination) *BOWEL PROBLEMS (unusual diarrhea, constipation, pain near the anus) TENDERNESS IN MOUTH AND THROAT WITH OR WITHOUT PRESENCE  OF ULCERS (sore throat, sores in mouth, or a toothache) UNUSUAL RASH, SWELLING OR PAIN  UNUSUAL VAGINAL DISCHARGE OR ITCHING   Items with * indicate a potential emergency and should be followed up as soon as possible or go to the Emergency Department if any problems should occur.  Please show the CHEMOTHERAPY ALERT CARD or IMMUNOTHERAPY ALERT CARD at check-in to the Emergency Department and triage nurse.  Should you have questions after your visit or need  to cancel or reschedule your appointment, please contact [facility] [place] - A DEPT OF [facility]  Dept: [phone]  and follow the prompts.  Office hours are 8:00 a.m. to 4:30 p.m. Monday - Friday. Please note that voicemails left after 4:00 p.m. may not be returned until the following business day.  We are closed weekends and major holidays. You have access to a nurse at all times for urgent questions. Please call the main number to the clinic Dept: [phone] and follow the prompts.  For any non-urgent questions, you may also contact your provider using MyChart. We now offer e-Visits for anyone 10 and older to request care online for non-urgent symptoms. For details visit [email or web address].   Also download the MyChart app! Go to the app store, search MyChart, open the app, select [facility], and log in with your MyChart username and password.

## 2024-04-17 NOTE — Progress Notes (Signed)
 1345 OKAY TO PROCEED WITH TREATMENT TODAY DESPITE PLATELET COUNT BEING AT 32 PER DR. EZZARD. WE WILL RECHECK HER ON NEXT MONDAY. PATIENT INFORMED AND AGREES.

## 2024-04-17 NOTE — Progress Notes (Signed)
 Keep dose of bevacizumab  at 300 mg despite weight gain since weight gain is due to increasing ascites.  Will continue to monitor.

## 2024-04-18 ENCOUNTER — Other Ambulatory Visit: Payer: Self-pay

## 2024-04-18 ENCOUNTER — Other Ambulatory Visit: Payer: Self-pay | Admitting: Oncology

## 2024-04-18 DIAGNOSIS — C183 Malignant neoplasm of hepatic flexure: Secondary | ICD-10-CM

## 2024-04-18 DIAGNOSIS — R188 Other ascites: Secondary | ICD-10-CM

## 2024-04-20 ENCOUNTER — Ambulatory Visit (HOSPITAL_COMMUNITY)
Admission: RE | Admit: 2024-04-20 | Discharge: 2024-04-20 | Disposition: A | Discharge status: Home / Self Care | Source: Ambulatory Visit | Attending: Oncology | Admitting: Oncology

## 2024-04-20 DIAGNOSIS — R188 Other ascites: Secondary | ICD-10-CM | POA: Diagnosis not present

## 2024-04-20 DIAGNOSIS — E039 Hypothyroidism, unspecified: Secondary | ICD-10-CM | POA: Diagnosis not present

## 2024-04-20 DIAGNOSIS — C7931 Secondary malignant neoplasm of brain: Secondary | ICD-10-CM | POA: Diagnosis not present

## 2024-04-20 DIAGNOSIS — Z515 Encounter for palliative care: Secondary | ICD-10-CM | POA: Diagnosis not present

## 2024-04-20 DIAGNOSIS — C189 Malignant neoplasm of colon, unspecified: Secondary | ICD-10-CM | POA: Diagnosis not present

## 2024-04-20 DIAGNOSIS — E7800 Pure hypercholesterolemia, unspecified: Secondary | ICD-10-CM | POA: Diagnosis not present

## 2024-04-20 HISTORY — PX: IR PARACENTESIS: IMG2679

## 2024-04-20 MED ORDER — LIDOCAINE-EPINEPHRINE 1 %-1:100000 IJ SOLN
INTRAMUSCULAR | Status: AC
  Filled 2024-04-20: qty 1

## 2024-04-20 MED ORDER — LIDOCAINE-EPINEPHRINE 1 %-1:100000 IJ SOLN
20.0000 mL | Freq: Once | INTRAMUSCULAR | Status: AC
  Administered 2024-04-20: 8 mL via INTRADERMAL

## 2024-04-20 NOTE — Procedures (Signed)
 PROCEDURE SUMMARY:  Successful image-guided paracentesis from the right lateral abdomen.  Yielded 1.9 liters of clear, yellow fluid.  No immediate complications.  EBL: zero Patient tolerated well.   Specimen sent for labs.  Please see imaging section of Epic for full dictation.  Pricilla Moehle NP 04/20/2024 9:59 AM

## 2024-04-22 ENCOUNTER — Encounter: Payer: Self-pay | Admitting: Oncology

## 2024-04-22 ENCOUNTER — Other Ambulatory Visit: Payer: Self-pay | Admitting: Psychiatry

## 2024-04-22 DIAGNOSIS — G4721 Circadian rhythm sleep disorder, delayed sleep phase type: Secondary | ICD-10-CM

## 2024-04-24 ENCOUNTER — Inpatient Hospital Stay

## 2024-04-24 ENCOUNTER — Encounter: Payer: Self-pay | Admitting: Oncology

## 2024-04-24 ENCOUNTER — Other Ambulatory Visit: Payer: Self-pay

## 2024-04-24 ENCOUNTER — Other Ambulatory Visit: Payer: Self-pay | Admitting: Hematology and Oncology

## 2024-04-24 ENCOUNTER — Telehealth: Payer: Self-pay

## 2024-04-24 VITALS — BP 128/73 | HR 86 | Temp 98.4°F | Resp 16

## 2024-04-24 DIAGNOSIS — R110 Nausea: Secondary | ICD-10-CM

## 2024-04-24 DIAGNOSIS — Z5111 Encounter for antineoplastic chemotherapy: Secondary | ICD-10-CM | POA: Diagnosis not present

## 2024-04-24 DIAGNOSIS — E860 Dehydration: Secondary | ICD-10-CM

## 2024-04-24 DIAGNOSIS — C183 Malignant neoplasm of hepatic flexure: Secondary | ICD-10-CM

## 2024-04-24 DIAGNOSIS — D509 Iron deficiency anemia, unspecified: Secondary | ICD-10-CM

## 2024-04-24 DIAGNOSIS — C182 Malignant neoplasm of ascending colon: Secondary | ICD-10-CM

## 2024-04-24 LAB — CBC WITH DIFFERENTIAL (CANCER CENTER ONLY)
Abs Immature Granulocytes: 0.01 K/uL (ref 0.00–0.07)
Basophils Absolute: 0 K/uL (ref 0.0–0.1)
Basophils Relative: 0 %
Eosinophils Absolute: 0 K/uL (ref 0.0–0.5)
Eosinophils Relative: 1 %
HCT: 27.3 % — ABNORMAL LOW (ref 36.0–46.0)
Hemoglobin: 8.9 g/dL — ABNORMAL LOW (ref 12.0–15.0)
Immature Granulocytes: 0 %
Lymphocytes Relative: 21 %
Lymphs Abs: 0.5 K/uL — ABNORMAL LOW (ref 0.7–4.0)
MCH: 34.2 pg — ABNORMAL HIGH (ref 26.0–34.0)
MCHC: 32.6 g/dL (ref 30.0–36.0)
MCV: 105 fL — ABNORMAL HIGH (ref 80.0–100.0)
Monocytes Absolute: 0.2 K/uL (ref 0.1–1.0)
Monocytes Relative: 7 %
Neutro Abs: 1.7 K/uL (ref 1.7–7.7)
Neutrophils Relative %: 71 %
Platelet Count: 52 K/uL — ABNORMAL LOW (ref 150–400)
RBC: 2.6 MIL/uL — ABNORMAL LOW (ref 3.87–5.11)
RDW: 18.7 % — ABNORMAL HIGH (ref 11.5–15.5)
WBC Count: 2.5 K/uL — ABNORMAL LOW (ref 4.0–10.5)
nRBC: 0 % (ref 0.0–0.2)

## 2024-04-24 LAB — CMP (CANCER CENTER ONLY)
ALT: 40 U/L (ref 0–44)
AST: 46 U/L — ABNORMAL HIGH (ref 15–41)
Albumin: 3 g/dL — ABNORMAL LOW (ref 3.5–5.0)
Alkaline Phosphatase: 501 U/L — ABNORMAL HIGH (ref 38–126)
Anion gap: 7 (ref 5–15)
BUN: 13 mg/dL (ref 6–20)
CO2: 27 mmol/L (ref 22–32)
Calcium: 8.7 mg/dL — ABNORMAL LOW (ref 8.9–10.3)
Chloride: 105 mmol/L (ref 98–111)
Creatinine: 1.16 mg/dL — ABNORMAL HIGH (ref 0.44–1.00)
GFR, Estimated: 55 mL/min — ABNORMAL LOW (ref >60)
Glucose, Bld: 221 mg/dL — ABNORMAL HIGH (ref 70–99)
Potassium: 4.6 mmol/L (ref 3.5–5.1)
Sodium: 138 mmol/L (ref 135–145)
Total Bilirubin: 0.7 mg/dL (ref 0.0–1.2)
Total Protein: 5.5 g/dL — ABNORMAL LOW (ref 6.5–8.1)

## 2024-04-24 MED ORDER — SODIUM CHLORIDE 0.9 % IV SOLN: INTRAVENOUS | Status: DC

## 2024-04-24 NOTE — Progress Notes (Signed)
 Per Dr. Ezzard- No platelets today. Platelets 52.

## 2024-04-24 NOTE — Telephone Encounter (Signed)
 I called pt to see how she is doing with taking the Lonsurf . Pt replied, I'm doing pretty good. She doesn't have any appetite- has to make herself eat. She has several episodes of diarrhea daily, but doesn't take the Imodium, as it then causes her to be constipated for 2 days. We discussed importance of maintaining good hydration, with a goal of 2L per day fluid intake- including at least 1 electrolyte replacement drink/day(since not using Imodium). She has increased fatigue, tires easily - some days doesn't have energy to take full bath. No appetite @ all. She admits to missing a couple doses of the Lonsurf . She had recent hold of Lonsurf  for 1 week due to fatigue, & ascites. She has required platelet transfusions since starting the Lonsurf . Please see chemo f/u call flowsheet for further assessment. Pt reminded to call us  if she were to develop temp of 100.4 or higher, day or night. She verbalized understanding. Reported above to Dr Ezzard and Kaitlyn Schomburg,RPH.

## 2024-04-24 NOTE — Patient Instructions (Signed)

## 2024-04-26 ENCOUNTER — Encounter: Payer: Self-pay | Admitting: Oncology

## 2024-04-26 LAB — CYTOLOGY - NON PAP

## 2024-05-01 ENCOUNTER — Encounter: Payer: Self-pay | Admitting: Oncology

## 2024-05-01 ENCOUNTER — Encounter: Payer: Self-pay | Admitting: Hematology and Oncology

## 2024-05-01 ENCOUNTER — Other Ambulatory Visit: Payer: Self-pay | Admitting: Pharmacist

## 2024-05-01 ENCOUNTER — Inpatient Hospital Stay: Attending: Oncology

## 2024-05-01 ENCOUNTER — Inpatient Hospital Stay

## 2024-05-01 ENCOUNTER — Other Ambulatory Visit: Payer: Self-pay

## 2024-05-01 VITALS — BP 129/70 | HR 88 | Temp 98.0°F | Resp 18

## 2024-05-01 DIAGNOSIS — D702 Other drug-induced agranulocytosis: Secondary | ICD-10-CM

## 2024-05-01 DIAGNOSIS — Z5111 Encounter for antineoplastic chemotherapy: Secondary | ICD-10-CM | POA: Insufficient documentation

## 2024-05-01 DIAGNOSIS — C182 Malignant neoplasm of ascending colon: Secondary | ICD-10-CM

## 2024-05-01 DIAGNOSIS — D509 Iron deficiency anemia, unspecified: Secondary | ICD-10-CM

## 2024-05-01 DIAGNOSIS — C189 Malignant neoplasm of colon, unspecified: Secondary | ICD-10-CM | POA: Insufficient documentation

## 2024-05-01 DIAGNOSIS — C7931 Secondary malignant neoplasm of brain: Secondary | ICD-10-CM | POA: Diagnosis not present

## 2024-05-01 DIAGNOSIS — D696 Thrombocytopenia, unspecified: Secondary | ICD-10-CM | POA: Diagnosis not present

## 2024-05-01 DIAGNOSIS — C7801 Secondary malignant neoplasm of right lung: Secondary | ICD-10-CM | POA: Diagnosis not present

## 2024-05-01 DIAGNOSIS — C7971 Secondary malignant neoplasm of right adrenal gland: Secondary | ICD-10-CM | POA: Insufficient documentation

## 2024-05-01 DIAGNOSIS — C7802 Secondary malignant neoplasm of left lung: Secondary | ICD-10-CM | POA: Diagnosis not present

## 2024-05-01 DIAGNOSIS — C183 Malignant neoplasm of hepatic flexure: Secondary | ICD-10-CM

## 2024-05-01 DIAGNOSIS — Z5189 Encounter for other specified aftercare: Secondary | ICD-10-CM | POA: Diagnosis not present

## 2024-05-01 DIAGNOSIS — C787 Secondary malignant neoplasm of liver and intrahepatic bile duct: Secondary | ICD-10-CM | POA: Diagnosis not present

## 2024-05-01 DIAGNOSIS — C786 Secondary malignant neoplasm of retroperitoneum and peritoneum: Secondary | ICD-10-CM | POA: Insufficient documentation

## 2024-05-01 LAB — COMPREHENSIVE METABOLIC PANEL WITH GFR
ALT: 25 U/L (ref 0–44)
AST: 37 U/L (ref 15–41)
Albumin: 2.8 g/dL — ABNORMAL LOW (ref 3.5–5.0)
Alkaline Phosphatase: 449 U/L — ABNORMAL HIGH (ref 38–126)
Anion gap: 7 (ref 5–15)
BUN: 17 mg/dL (ref 6–20)
CO2: 23 mmol/L (ref 22–32)
Calcium: 8.2 mg/dL — ABNORMAL LOW (ref 8.9–10.3)
Chloride: 107 mmol/L (ref 98–111)
Creatinine, Ser: 1.27 mg/dL — ABNORMAL HIGH (ref 0.44–1.00)
GFR, Estimated: 49 mL/min — ABNORMAL LOW (ref >60)
Glucose, Bld: 189 mg/dL — ABNORMAL HIGH (ref 70–99)
Potassium: 4.3 mmol/L (ref 3.5–5.1)
Sodium: 137 mmol/L (ref 135–145)
Total Bilirubin: 0.3 mg/dL (ref 0.0–1.2)
Total Protein: 5.1 g/dL — ABNORMAL LOW (ref 6.5–8.1)

## 2024-05-01 LAB — CBC WITH DIFFERENTIAL (CANCER CENTER ONLY)
Abs Immature Granulocytes: 0 K/uL (ref 0.00–0.07)
Basophils Absolute: 0 K/uL (ref 0.0–0.1)
Basophils Relative: 0 %
Eosinophils Absolute: 0 K/uL (ref 0.0–0.5)
Eosinophils Relative: 2 %
HCT: 21.9 % — ABNORMAL LOW (ref 36.0–46.0)
Hemoglobin: 7.3 g/dL — ABNORMAL LOW (ref 12.0–15.0)
Immature Granulocytes: 0 %
Lymphocytes Relative: 32 %
Lymphs Abs: 0.6 K/uL — ABNORMAL LOW (ref 0.7–4.0)
MCH: 34.3 pg — ABNORMAL HIGH (ref 26.0–34.0)
MCHC: 33.3 g/dL (ref 30.0–36.0)
MCV: 102.8 fL — ABNORMAL HIGH (ref 80.0–100.0)
Monocytes Absolute: 0.1 K/uL (ref 0.1–1.0)
Monocytes Relative: 7 %
Neutro Abs: 1.2 K/uL — ABNORMAL LOW (ref 1.7–7.7)
Neutrophils Relative %: 59 %
Platelet Count: 24 K/uL — ABNORMAL LOW (ref 150–400)
RBC: 2.13 MIL/uL — ABNORMAL LOW (ref 3.87–5.11)
RDW: 17.9 % — ABNORMAL HIGH (ref 11.5–15.5)
WBC Count: 2 K/uL — ABNORMAL LOW (ref 4.0–10.5)
nRBC: 0 % (ref 0.0–0.2)

## 2024-05-01 MED ORDER — SODIUM CHLORIDE 0.9 % IV SOLN
5.0000 mg/kg | Freq: Once | INTRAVENOUS | Status: AC
  Administered 2024-05-01: 300 mg via INTRAVENOUS
  Filled 2024-05-01: qty 12

## 2024-05-01 MED ORDER — SODIUM CHLORIDE 0.9 % IV SOLN: INTRAVENOUS | Status: DC

## 2024-05-01 NOTE — Patient Instructions (Signed)
 Bevacizumab Injection What is this medication? BEVACIZUMAB (be va SIZ yoo mab) treats some types of cancer. It works by blocking a protein that causes cancer cells to grow and multiply. This helps to slow or stop the spread of cancer cells. It is a monoclonal antibody. This medicine may be used for other purposes; ask your health care provider or pharmacist if you have questions. COMMON BRAND NAME(S): Alymsys, Avastin, MVASI, Rosaland Lao What should I tell my care team before I take this medication? They need to know if you have any of these conditions: Blood clots Coughing up blood Having or recent surgery Heart failure High blood pressure History of a connection between 2 or more body parts that do not usually connect (fistula) History of a tear in your stomach or intestines Protein in your urine An unusual or allergic reaction to bevacizumab, other medications, foods, dyes, or preservatives Pregnant or trying to get pregnant Breast-feeding How should I use this medication? This medication is injected into a vein. It is given by your care team in a hospital or clinic setting. Talk to your care team the use of this medication in children. Special care may be needed. Overdosage: If you think you have taken too much of this medicine contact a poison control center or emergency room at once. NOTE: This medicine is only for you. Do not share this medicine with others. What if I miss a dose? Keep appointments for follow-up doses. It is important not to miss your dose. Call your care team if you are unable to keep an appointment. What may interact with this medication? Interactions are not expected. This list may not describe all possible interactions. Give your health care provider a list of all the medicines, herbs, non-prescription drugs, or dietary supplements you use. Also tell them if you smoke, drink alcohol, or use illegal drugs. Some items may interact with your medicine. What  should I watch for while using this medication? Your condition will be monitored carefully while you are receiving this medication. You may need blood work while taking this medication. This medication may make you feel generally unwell. This is not uncommon as chemotherapy can affect healthy cells as well as cancer cells. Report any side effects. Continue your course of treatment even though you feel ill unless your care team tells you to stop. This medication may increase your risk to bruise or bleed. Call your care team if you notice any unusual bleeding. Before having surgery, talk to your care team to make sure it is ok. This medication can increase the risk of poor healing of your surgical site or wound. You will need to stop this medication for 28 days before surgery. After surgery, wait at least 28 days before restarting this medication. Make sure the surgical site or wound is healed enough before restarting this medication. Talk to your care team if questions. Talk to your care team if you may be pregnant. Serious birth defects can occur if you take this medication during pregnancy and for 6 months after the last dose. Contraception is recommended while taking this medication and for 6 months after the last dose. Your care team can help you find the option that works for you. Do not breastfeed while taking this medication and for 6 months after the last dose. This medication can cause infertility. Talk to your care team if you are concerned about your fertility. What side effects may I notice from receiving this medication? Side effects that you should  report to your care team as soon as possible: Allergic reactions--skin rash, itching, hives, swelling of the face, lips, tongue, or throat Bleeding--bloody or black, tar-like stools, vomiting blood or brown material that looks like coffee grounds, red or dark brown urine, small red or purple spots on skin, unusual bruising or bleeding Blood  clot--pain, swelling, or warmth in the leg, shortness of breath, chest pain Heart attack--pain or tightness in the chest, shoulders, arms, or jaw, nausea, shortness of breath, cold or clammy skin, feeling faint or lightheaded Heart failure--shortness of breath, swelling of the ankles, feet, or hands, sudden weight gain, unusual weakness or fatigue Increase in blood pressure Infection--fever, chills, cough, sore throat, wounds that don't heal, pain or trouble when passing urine, general feeling of discomfort or being unwell Infusion reactions--chest pain, shortness of breath or trouble breathing, feeling faint or lightheaded Kidney injury--decrease in the amount of urine, swelling of the ankles, hands, or feet Stomach pain that is severe, does not go away, or gets worse Stroke--sudden numbness or weakness of the face, arm, or leg, trouble speaking, confusion, trouble walking, loss of balance or coordination, dizziness, severe headache, change in vision Sudden and severe headache, confusion, change in vision, seizures, which may be signs of posterior reversible encephalopathy syndrome (PRES) Side effects that usually do not require medical attention (report to your care team if they continue or are bothersome): Back pain Change in taste Diarrhea Dry skin Increased tears Nosebleed This list may not describe all possible side effects. Call your doctor for medical advice about side effects. You may report side effects to FDA at 1-800-FDA-1088. Where should I keep my medication? This medication is given in a hospital or clinic. It will not be stored at home. NOTE: This sheet is a summary. It may not cover all possible information. If you have questions about this medicine, talk to your doctor, pharmacist, or health care provider.  2024 Elsevier/Gold Standard (2021-10-03 00:00:00)

## 2024-05-01 NOTE — Progress Notes (Signed)
 Keep dose of bevacizumab  at 300 mg despite weight gain since weight gain is due to increasing ascites.  Will continue to monitor.

## 2024-05-02 ENCOUNTER — Inpatient Hospital Stay

## 2024-05-02 ENCOUNTER — Other Ambulatory Visit: Payer: Self-pay

## 2024-05-02 DIAGNOSIS — D696 Thrombocytopenia, unspecified: Secondary | ICD-10-CM

## 2024-05-02 DIAGNOSIS — C787 Secondary malignant neoplasm of liver and intrahepatic bile duct: Secondary | ICD-10-CM

## 2024-05-02 DIAGNOSIS — D509 Iron deficiency anemia, unspecified: Secondary | ICD-10-CM

## 2024-05-02 DIAGNOSIS — Z5111 Encounter for antineoplastic chemotherapy: Secondary | ICD-10-CM | POA: Diagnosis not present

## 2024-05-02 LAB — PREPARE RBC (CROSSMATCH)

## 2024-05-02 MED ORDER — ACETAMINOPHEN 325 MG PO TABS
650.0000 mg | ORAL_TABLET | Freq: Once | ORAL | Status: AC
  Administered 2024-05-02: 650 mg via ORAL
  Filled 2024-05-02: qty 2

## 2024-05-02 MED ORDER — DIPHENHYDRAMINE HCL 25 MG PO CAPS
25.0000 mg | ORAL_CAPSULE | Freq: Once | ORAL | Status: AC
  Administered 2024-05-02: 25 mg via ORAL
  Filled 2024-05-02: qty 1

## 2024-05-02 NOTE — Patient Instructions (Signed)

## 2024-05-03 ENCOUNTER — Encounter: Payer: Self-pay | Admitting: Hematology and Oncology

## 2024-05-03 ENCOUNTER — Encounter: Payer: Self-pay | Admitting: Oncology

## 2024-05-03 ENCOUNTER — Inpatient Hospital Stay

## 2024-05-03 DIAGNOSIS — C787 Secondary malignant neoplasm of liver and intrahepatic bile duct: Secondary | ICD-10-CM

## 2024-05-03 DIAGNOSIS — Z5111 Encounter for antineoplastic chemotherapy: Secondary | ICD-10-CM | POA: Diagnosis not present

## 2024-05-03 DIAGNOSIS — D696 Thrombocytopenia, unspecified: Secondary | ICD-10-CM

## 2024-05-03 MED ORDER — DIPHENHYDRAMINE HCL 25 MG PO CAPS
25.0000 mg | ORAL_CAPSULE | Freq: Once | ORAL | Status: AC
  Administered 2024-05-03: 25 mg via ORAL
  Filled 2024-05-03: qty 1

## 2024-05-03 MED ORDER — SODIUM CHLORIDE 0.9% IV SOLUTION
250.0000 mL | INTRAVENOUS | Status: DC
  Administered 2024-05-03: 250 mL via INTRAVENOUS

## 2024-05-03 MED ORDER — ACETAMINOPHEN 325 MG PO TABS
650.0000 mg | ORAL_TABLET | Freq: Once | ORAL | Status: AC
  Administered 2024-05-03: 650 mg via ORAL
  Filled 2024-05-03: qty 2

## 2024-05-03 NOTE — Patient Instructions (Signed)

## 2024-05-04 LAB — TYPE AND SCREEN
ABO/RH(D): A POS
Antibody Screen: POSITIVE
Donor AG Type: NEGATIVE
Donor AG Type: NEGATIVE
Unit division: 0
Unit division: 0

## 2024-05-04 LAB — BPAM RBC
Blood Product Expiration Date: 202512202359
Blood Product Expiration Date: 202512212359
ISSUE DATE / TIME: 202512021209
ISSUE DATE / TIME: 202512030801
Unit Type and Rh: 202512202359
Unit Type and Rh: 6200
Unit Type and Rh: 6200

## 2024-05-08 ENCOUNTER — Encounter: Payer: Self-pay | Admitting: Hematology and Oncology

## 2024-05-08 ENCOUNTER — Encounter: Payer: Self-pay | Admitting: Oncology

## 2024-05-10 ENCOUNTER — Telehealth: Payer: Self-pay

## 2024-05-10 NOTE — Telephone Encounter (Signed)
 PA approved Auvelity  45-105 mg #180/90 day RxAdvance Health Team Advantage Medicare 05/09/24-05/31/25

## 2024-05-11 ENCOUNTER — Telehealth: Payer: Self-pay

## 2024-05-14 NOTE — Progress Notes (Unsigned)
 [facility] at [facility] [address] [place],  [place]  [address] [phone]    Clinic Day:  05/15/2024  Referring physician: Glenford Harlene SAILOR, NP  HISTORY OF PRESENT ILLNESS:  The patient is a 58 y.o. female with metastatic colon cancer, which includes spread of disease to her abdominal cavity, lungs, brain, liver, and right adrenal gland.  She comes in today to be evaluated before heading into her 3rd cycle of Lonsurf /Avastin .  Of note, recent CT scans after her 1st cycle of Lonsurf  showed evidence of ascites.  However, a recent paracentesis did not reveal malignant cells.  The patient claims that abdominal fluid is gradually starting to rebuild within her abdomen.  However, it is not to the point where it is kind of uncomfortable or painful to wear a repeat paracentesis needs to be considered.  The patient claims to tolerate her second cycle of Lonsurf /Avastin  fairly well.  Understandably, with her worsening metastatic disease, she is concerned about both her quality and quantity of life.  With respect to her colon cancer history, she is status post a right hemicolectomy in early October 2018, followed by 12 cycles of adjuvant FOLFOX chemotherapy, which were completed in April 2019.  In December 2021, she underwent a left cerebellar metastasectomy, whose pathology was consistent with metastatic colon cancer.  Tumor testing did come back MMR normal. CT scans revealed evidence of her cancer being in multiple locations, for which she took 40 cycles of FOLFIRI/Avastin .  As follow-up scans revealed liver metastasis, she was placed back on FOLFOX/Avastin .  She underwent 30 cycles of this regimen before scans showed disease progression.  This led to her recently being switched to Lonsurf /Avastin .  PHYSICAL EXAM:  Blood pressure 131/63, pulse 88, temperature 98.4 F (36.9 C), temperature source Oral, resp. rate 16, height 5' 8 (1.727 m), weight 149 lb 3.2 oz  (67.7 kg), last menstrual period 02/22/2019, SpO2 97%. Body mass index is 22.69 kg/m.  Performance status (ECOG): 1 - Symptomatic but completely ambulatory  Physical Exam Vitals and nursing note reviewed.  Constitutional:      General: She is not in acute distress.    Appearance: Normal appearance.     Comments:    HENT:     Head: Normocephalic and atraumatic.     Mouth/Throat:     Mouth: Mucous membranes are moist.     Pharynx: Oropharynx is clear. No oropharyngeal exudate or posterior oropharyngeal erythema.  Eyes:     General: No scleral icterus.    Extraocular Movements: Extraocular movements intact.     Conjunctiva/sclera: Conjunctivae normal.     Pupils: Pupils are equal, round, and reactive to light.  Cardiovascular:     Rate and Rhythm: Normal rate and regular rhythm.     Heart sounds: Normal heart sounds. No murmur heard.    No friction rub. No gallop.  Pulmonary:     Effort: Pulmonary effort is normal.     Breath sounds: Normal breath sounds. No wheezing, rhonchi or rales.  Abdominal:     General: There is distension (Mild).     Palpations: Abdomen is soft. There is fluid wave. There is no hepatomegaly, splenomegaly or mass.     Tenderness: There is no abdominal tenderness.  Musculoskeletal:        General: Normal range of motion.     Cervical back: Normal range of motion and neck supple. No tenderness.     Right lower leg: No edema.     Left  lower leg: No edema.  Lymphadenopathy:     Cervical: No cervical adenopathy.     Upper Body:     Right upper body: No supraclavicular or axillary adenopathy.     Left upper body: No supraclavicular or axillary adenopathy.     Lower Body: No right inguinal adenopathy. No left inguinal adenopathy.  Skin:    General: Skin is warm and dry.     Coloration: Skin is not jaundiced.     Findings: No rash.  Neurological:     Mental Status: She is alert and oriented to person, place, and time.     Cranial Nerves: No cranial  nerve deficit.  Psychiatric:        Mood and Affect: Mood normal.        Behavior: Behavior normal.        Thought Content: Thought content normal.    LABS:      Latest Ref Rng & Units 05/15/2024    9:43 AM 05/01/2024   10:14 AM 04/24/2024   10:04 AM  CBC  WBC 4.0 - 10.5 K/uL 1.2  2.0  2.5   Hemoglobin 12.0 - 15.0 g/dL 9.5  7.3  8.9   Hematocrit 36.0 - 46.0 % 29.1  21.9  27.3   Platelets 150 - 400 K/uL 23  24  52     Latest Reference Range & Units 05/15/24 09:43  Neutrophils % 29  Lymphocytes % 48  Monocytes Relative % 20  Eosinophil % 2  Basophil % 0  Immature Granulocytes % 1  NEUT# 1.7 - 7.7 K/uL 0.4 (LL)  (LL): Data is critically low    Latest Ref Rng & Units 05/15/2024    9:43 AM 05/01/2024   10:14 AM 04/24/2024   10:04 AM  CMP  Glucose 70 - 99 mg/dL 85  810  778   BUN 6 - 20 mg/dL 15  17  13    Creatinine 0.44 - 1.00 mg/dL 8.80  8.72  8.83   Sodium 135 - 145 mmol/L 142  137  138   Potassium 3.5 - 5.1 mmol/L 4.0  4.3  4.6   Chloride 98 - 111 mmol/L 108  107  105   CO2 22 - 32 mmol/L 26  23  27    Calcium  8.9 - 10.3 mg/dL 8.7  8.2  8.7   Total Protein 6.5 - 8.1 g/dL 5.5  5.1  5.5   Total Bilirubin 0.0 - 1.2 mg/dL 0.6  0.3  0.7   Alkaline Phos 38 - 126 U/L 606  449  501   AST 15 - 41 U/L 80  37  46   ALT 0 - 44 U/L 58  25  40     Latest Reference Range & Units 12/15/23 09:46 03/13/24 10:20 04/10/24 13:19  CEA ([facility]) 0.00 - 5.00 ng/mL 66.98 (H) 113.41 (H) 104.23 (H)  (H): Data is abnormally high  SCANS:  CT scans of her abdomen/pelvis on 04-14-24 revealed the following:     ASSESSMENT & PLAN:  Assessment/Plan:  A 58 y.o. female with metastatic colon cancer.  Due to a low absolute neutrophil count and thrombocytopenia, I have told the patient to hold her Lonsurf /Avastin  for 1 week.  Hopefully, this will be enough time to get her platelets back up to more suitable levels.  With respect to her severe neutropenia, I will have the patient receive Zarzio both today  and tomorrow.  Her third cycle of Lonsurf /Avastin , barring any changes, will commence on December  22nd.  I strongly encouraged the patient to contact our office over these next few weeks if her ascites becomes more prominent to where a repeat paracentesis would need to be considered.  Of note, as she does feel somewhat dry today, I will arrange for her to receive 1 L of IV fluids.  Otherwise, I will see her back in mid January 2026 to clinically reassess her before she heads into her fourth cycle of Lonsurf /Avastin .   The patient understands all the plans discussed today and is in agreement with them.  Chavis Tessler DELENA Kerns, MD

## 2024-05-15 ENCOUNTER — Other Ambulatory Visit: Payer: Self-pay

## 2024-05-15 ENCOUNTER — Inpatient Hospital Stay: Admitting: Oncology

## 2024-05-15 ENCOUNTER — Encounter: Payer: Self-pay | Admitting: Oncology

## 2024-05-15 ENCOUNTER — Inpatient Hospital Stay

## 2024-05-15 VITALS — BP 131/63 | HR 88 | Temp 98.4°F | Resp 16 | Ht 68.0 in | Wt 149.2 lb

## 2024-05-15 DIAGNOSIS — C189 Malignant neoplasm of colon, unspecified: Secondary | ICD-10-CM

## 2024-05-15 DIAGNOSIS — C188 Malignant neoplasm of overlapping sites of colon: Secondary | ICD-10-CM

## 2024-05-15 DIAGNOSIS — Z5111 Encounter for antineoplastic chemotherapy: Secondary | ICD-10-CM | POA: Diagnosis not present

## 2024-05-15 DIAGNOSIS — E860 Dehydration: Secondary | ICD-10-CM

## 2024-05-15 DIAGNOSIS — C183 Malignant neoplasm of hepatic flexure: Secondary | ICD-10-CM

## 2024-05-15 DIAGNOSIS — R110 Nausea: Secondary | ICD-10-CM

## 2024-05-15 LAB — CMP (CANCER CENTER ONLY)
ALT: 58 U/L — ABNORMAL HIGH (ref 0–44)
AST: 80 U/L — ABNORMAL HIGH (ref 15–41)
Albumin: 3.1 g/dL — ABNORMAL LOW (ref 3.5–5.0)
Alkaline Phosphatase: 606 U/L — ABNORMAL HIGH (ref 38–126)
Anion gap: 8 (ref 5–15)
BUN: 15 mg/dL (ref 6–20)
CO2: 26 mmol/L (ref 22–32)
Calcium: 8.7 mg/dL — ABNORMAL LOW (ref 8.9–10.3)
Chloride: 108 mmol/L (ref 98–111)
Creatinine: 1.19 mg/dL — ABNORMAL HIGH (ref 0.44–1.00)
GFR, Estimated: 53 mL/min — ABNORMAL LOW (ref >60)
Glucose, Bld: 85 mg/dL (ref 70–99)
Potassium: 4 mmol/L (ref 3.5–5.1)
Sodium: 142 mmol/L (ref 135–145)
Total Bilirubin: 0.6 mg/dL (ref 0.0–1.2)
Total Protein: 5.5 g/dL — ABNORMAL LOW (ref 6.5–8.1)

## 2024-05-15 LAB — CBC WITH DIFFERENTIAL (CANCER CENTER ONLY)
Abs Immature Granulocytes: 0.01 K/uL (ref 0.00–0.07)
Basophils Absolute: 0 K/uL (ref 0.0–0.1)
Basophils Relative: 0 %
Eosinophils Absolute: 0 K/uL (ref 0.0–0.5)
Eosinophils Relative: 2 %
HCT: 29.1 % — ABNORMAL LOW (ref 36.0–46.0)
Hemoglobin: 9.5 g/dL — ABNORMAL LOW (ref 12.0–15.0)
Immature Granulocytes: 1 %
Lymphocytes Relative: 48 %
Lymphs Abs: 0.6 K/uL — ABNORMAL LOW (ref 0.7–4.0)
MCH: 34.1 pg — ABNORMAL HIGH (ref 26.0–34.0)
MCHC: 32.6 g/dL (ref 30.0–36.0)
MCV: 104.3 fL — ABNORMAL HIGH (ref 80.0–100.0)
Monocytes Absolute: 0.2 K/uL (ref 0.1–1.0)
Monocytes Relative: 20 %
Neutro Abs: 0.4 K/uL — CL (ref 1.7–7.7)
Neutrophils Relative %: 29 %
Platelet Count: 23 K/uL — ABNORMAL LOW (ref 150–400)
RBC: 2.79 MIL/uL — ABNORMAL LOW (ref 3.87–5.11)
RDW: 19.2 % — ABNORMAL HIGH (ref 11.5–15.5)
WBC Count: 1.2 K/uL — ABNORMAL LOW (ref 4.0–10.5)
nRBC: 0 % (ref 0.0–0.2)

## 2024-05-15 LAB — TOTAL PROTEIN, URINE DIPSTICK: Protein, ur: NEGATIVE mg/dL

## 2024-05-15 MED ORDER — SODIUM CHLORIDE 0.9 % IV SOLN: Freq: Once | INTRAVENOUS | Status: AC

## 2024-05-15 MED ORDER — FILGRASTIM-SNDZ 480 MCG/0.8ML IJ SOSY
480.0000 ug | PREFILLED_SYRINGE | Freq: Once | INTRAMUSCULAR | Status: AC
  Administered 2024-05-15: 11:00:00 480 ug via SUBCUTANEOUS
  Filled 2024-05-15: qty 0.8

## 2024-05-15 NOTE — Progress Notes (Signed)
 CRITICAL VALUE STICKER  CRITICAL VALUE:  ANC 0.4  RECEIVER (on-site recipient of call):  Woodie Kapur RN  DATE & TIME NOTIFIED:   05/15/2024 @ 1004  MESSENGER (representative from lab):  G[facility]  MD NOTIFIED:   Dr. Ezzard  TIME OF NOTIFICATION:  1011  RESPONSE: Acknowledged

## 2024-05-15 NOTE — Patient Instructions (Signed)
 Filgrastim Injection What is this medication? FILGRASTIM (fil GRA stim) lowers the risk of infection in people who are receiving chemotherapy. It works by Systems analyst make more white blood cells, which protects your body from infection. It may also be used to help people who have been exposed to high doses of radiation. It can be used to help prepare your body before a stem cell transplant. It works by helping your bone marrow make and release stem cells into the blood. This medicine may be used for other purposes; ask your health care provider or pharmacist if you have questions. COMMON BRAND NAME(S): Neupogen, Nivestym, Nypozi, Releuko, Zarxio What should I tell my care team before I take this medication? They need to know if you have any of these conditions: History of blood diseases, such as sickle cell anemia Kidney disease Recent or ongoing radiation An unusual or allergic reaction to filgrastim, pegfilgrastim, latex, rubber, other medications, foods, dyes, or preservatives Pregnant or trying to get pregnant Breast-feeding How should I use this medication? This medication is injected under the skin or into a vein. It is usually given by your care team in a hospital or clinic setting. It may be given at home. If you get this medication at home, you will be taught how to prepare and give it. Use exactly as directed. Take it as directed on the prescription label at the same time every day. Keep taking it unless your care team tells you to stop. It is important that you put your used needles and syringes in a special sharps container. Do not put them in a trash can. If you do not have a sharps container, call your pharmacist or care team to get one. This medication comes with INSTRUCTIONS FOR USE. Ask your pharmacist for directions on how to use this medication. Read the information carefully. Talk to your pharmacist or care team if you have questions. Talk to your care team about the use of  this medication in children. While it may be prescribed for children for selected conditions, precautions do apply. Overdosage: If you think you have taken too much of this medicine contact a poison control center or emergency room at once. NOTE: This medicine is only for you. Do not share this medicine with others. What if I miss a dose? It is important not to miss any doses. Talk to your care team about what to do if you miss a dose. What may interact with this medication? Medications that may cause a release of neutrophils, such as lithium This list may not describe all possible interactions. Give your health care provider a list of all the medicines, herbs, non-prescription drugs, or dietary supplements you use. Also tell them if you smoke, drink alcohol, or use illegal drugs. Some items may interact with your medicine. What should I watch for while using this medication? Your condition will be monitored carefully while you are receiving this medication. You may need bloodwork while taking this medication. Talk to your care team about your risk of cancer. You may be more at risk for certain types of cancer if you take this medication. What side effects may I notice from receiving this medication? Side effects that you should report to your care team as soon as possible: Allergic reactions--skin rash, itching, hives, swelling of the face, lips, tongue, or throat Capillary leak syndrome--stomach or muscle pain, unusual weakness or fatigue, feeling faint or lightheaded, decrease in the amount of urine, swelling of the ankles, hands,  or feet, trouble breathing High white blood cell level--fever, fatigue, trouble breathing, night sweats, change in vision, weight loss Inflammation of the aorta--fever, fatigue, back, chest, or stomach pain, severe headache Kidney injury (glomerulonephritis)--decrease in the amount of urine, red or dark brown urine, foamy or bubbly urine, swelling of the ankles, hands,  or feet Shortness of breath or trouble breathing Spleen injury--pain in upper left stomach or shoulder Unusual bruising or bleeding Side effects that usually do not require medical attention (report to your care team if they continue or are bothersome): Back pain Bone pain Fatigue Fever Headache Nausea This list may not describe all possible side effects. Call your doctor for medical advice about side effects. You may report side effects to FDA at 1-800-FDA-1088. Where should I keep my medication? Keep out of the reach of children and pets. Keep this medication in the original packaging until you are ready to take it. Protect from light. See product for storage information. Each product may have different instructions. Get rid of any unused medication after the expiration date. To get rid of medications that are no longer needed or have expired: Take the medication to a medications take-back program. Check with your pharmacy or law enforcement to find a location. If you cannot return the medication, ask your pharmacist or care team how to get rid of this medication safely. NOTE: This sheet is a summary. It may not cover all possible information. If you have questions about this medicine, talk to your doctor, pharmacist, or health care provider.  2024 Elsevier/[employer] Standard (2021-10-09 00:00:00)

## 2024-05-16 ENCOUNTER — Inpatient Hospital Stay

## 2024-05-16 ENCOUNTER — Telehealth: Payer: Self-pay | Admitting: Psychiatry

## 2024-05-16 VITALS — BP 141/70 | HR 98 | Temp 98.6°F | Resp 16

## 2024-05-16 DIAGNOSIS — E860 Dehydration: Secondary | ICD-10-CM

## 2024-05-16 DIAGNOSIS — Z5111 Encounter for antineoplastic chemotherapy: Secondary | ICD-10-CM | POA: Diagnosis not present

## 2024-05-16 DIAGNOSIS — R110 Nausea: Secondary | ICD-10-CM

## 2024-05-16 MED ORDER — FILGRASTIM-SNDZ 480 MCG/0.8ML IJ SOSY
480.0000 ug | PREFILLED_SYRINGE | Freq: Once | INTRAMUSCULAR | Status: AC
  Administered 2024-05-16: 13:00:00 480 ug via SUBCUTANEOUS
  Filled 2024-05-16: qty 0.8

## 2024-05-16 NOTE — Patient Instructions (Signed)
 Filgrastim Injection What is this medication? FILGRASTIM (fil GRA stim) lowers the risk of infection in people who are receiving chemotherapy. It works by Systems analyst make more white blood cells, which protects your body from infection. It may also be used to help people who have been exposed to high doses of radiation. It can be used to help prepare your body before a stem cell transplant. It works by helping your bone marrow make and release stem cells into the blood. This medicine may be used for other purposes; ask your health care provider or pharmacist if you have questions. COMMON BRAND NAME(S): Neupogen, Nivestym, Nypozi, Releuko, Zarxio What should I tell my care team before I take this medication? They need to know if you have any of these conditions: History of blood diseases, such as sickle cell anemia Kidney disease Recent or ongoing radiation An unusual or allergic reaction to filgrastim, pegfilgrastim, latex, rubber, other medications, foods, dyes, or preservatives Pregnant or trying to get pregnant Breast-feeding How should I use this medication? This medication is injected under the skin or into a vein. It is usually given by your care team in a hospital or clinic setting. It may be given at home. If you get this medication at home, you will be taught how to prepare and give it. Use exactly as directed. Take it as directed on the prescription label at the same time every day. Keep taking it unless your care team tells you to stop. It is important that you put your used needles and syringes in a special sharps container. Do not put them in a trash can. If you do not have a sharps container, call your pharmacist or care team to get one. This medication comes with INSTRUCTIONS FOR USE. Ask your pharmacist for directions on how to use this medication. Read the information carefully. Talk to your pharmacist or care team if you have questions. Talk to your care team about the use of  this medication in children. While it may be prescribed for children for selected conditions, precautions do apply. Overdosage: If you think you have taken too much of this medicine contact a poison control center or emergency room at once. NOTE: This medicine is only for you. Do not share this medicine with others. What if I miss a dose? It is important not to miss any doses. Talk to your care team about what to do if you miss a dose. What may interact with this medication? Medications that may cause a release of neutrophils, such as lithium This list may not describe all possible interactions. Give your health care provider a list of all the medicines, herbs, non-prescription drugs, or dietary supplements you use. Also tell them if you smoke, drink alcohol, or use illegal drugs. Some items may interact with your medicine. What should I watch for while using this medication? Your condition will be monitored carefully while you are receiving this medication. You may need bloodwork while taking this medication. Talk to your care team about your risk of cancer. You may be more at risk for certain types of cancer if you take this medication. What side effects may I notice from receiving this medication? Side effects that you should report to your care team as soon as possible: Allergic reactions--skin rash, itching, hives, swelling of the face, lips, tongue, or throat Capillary leak syndrome--stomach or muscle pain, unusual weakness or fatigue, feeling faint or lightheaded, decrease in the amount of urine, swelling of the ankles, hands,  or feet, trouble breathing High white blood cell level--fever, fatigue, trouble breathing, night sweats, change in vision, weight loss Inflammation of the aorta--fever, fatigue, back, chest, or stomach pain, severe headache Kidney injury (glomerulonephritis)--decrease in the amount of urine, red or dark brown urine, foamy or bubbly urine, swelling of the ankles, hands,  or feet Shortness of breath or trouble breathing Spleen injury--pain in upper left stomach or shoulder Unusual bruising or bleeding Side effects that usually do not require medical attention (report to your care team if they continue or are bothersome): Back pain Bone pain Fatigue Fever Headache Nausea This list may not describe all possible side effects. Call your doctor for medical advice about side effects. You may report side effects to FDA at 1-800-FDA-1088. Where should I keep my medication? Keep out of the reach of children and pets. Keep this medication in the original packaging until you are ready to take it. Protect from light. See product for storage information. Each product may have different instructions. Get rid of any unused medication after the expiration date. To get rid of medications that are no longer needed or have expired: Take the medication to a medications take-back program. Check with your pharmacy or law enforcement to find a location. If you cannot return the medication, ask your pharmacist or care team how to get rid of this medication safely. NOTE: This sheet is a summary. It may not cover all possible information. If you have questions about this medicine, talk to your doctor, pharmacist, or health care provider.  2024 Elsevier/[employer] Standard (2021-10-09 00:00:00)

## 2024-05-16 NOTE — Telephone Encounter (Signed)
 error

## 2024-05-22 ENCOUNTER — Encounter: Payer: Self-pay | Admitting: Oncology

## 2024-05-22 ENCOUNTER — Inpatient Hospital Stay

## 2024-05-22 ENCOUNTER — Other Ambulatory Visit (HOSPITAL_COMMUNITY): Payer: Self-pay

## 2024-05-22 ENCOUNTER — Telehealth: Payer: Self-pay

## 2024-05-22 VITALS — BP 129/74 | HR 91 | Temp 98.3°F | Resp 18

## 2024-05-22 DIAGNOSIS — C182 Malignant neoplasm of ascending colon: Secondary | ICD-10-CM

## 2024-05-22 DIAGNOSIS — C189 Malignant neoplasm of colon, unspecified: Secondary | ICD-10-CM

## 2024-05-22 DIAGNOSIS — Z5111 Encounter for antineoplastic chemotherapy: Secondary | ICD-10-CM | POA: Diagnosis not present

## 2024-05-22 LAB — CMP (CANCER CENTER ONLY)
ALT: 32 U/L (ref 0–44)
AST: 52 U/L — ABNORMAL HIGH (ref 15–41)
Albumin: 3.2 g/dL — ABNORMAL LOW (ref 3.5–5.0)
Alkaline Phosphatase: 624 U/L — ABNORMAL HIGH (ref 38–126)
Anion gap: 9 (ref 5–15)
BUN: 9 mg/dL (ref 6–20)
CO2: 26 mmol/L (ref 22–32)
Calcium: 8.5 mg/dL — ABNORMAL LOW (ref 8.9–10.3)
Chloride: 107 mmol/L (ref 98–111)
Creatinine: 1.3 mg/dL — ABNORMAL HIGH (ref 0.44–1.00)
GFR, Estimated: 48 mL/min — ABNORMAL LOW
Glucose, Bld: 189 mg/dL — ABNORMAL HIGH (ref 70–99)
Potassium: 4.2 mmol/L (ref 3.5–5.1)
Sodium: 142 mmol/L (ref 135–145)
Total Bilirubin: 0.5 mg/dL (ref 0.0–1.2)
Total Protein: 5.7 g/dL — ABNORMAL LOW (ref 6.5–8.1)

## 2024-05-22 LAB — CBC WITH DIFFERENTIAL (CANCER CENTER ONLY)
Abs Immature Granulocytes: 0.11 K/uL — ABNORMAL HIGH (ref 0.00–0.07)
Basophils Absolute: 0 K/uL (ref 0.0–0.1)
Basophils Relative: 0 %
Eosinophils Absolute: 0 K/uL (ref 0.0–0.5)
Eosinophils Relative: 0 %
HCT: 34 % — ABNORMAL LOW (ref 36.0–46.0)
Hemoglobin: 10.9 g/dL — ABNORMAL LOW (ref 12.0–15.0)
Immature Granulocytes: 2 %
Lymphocytes Relative: 14 %
Lymphs Abs: 0.6 K/uL — ABNORMAL LOW (ref 0.7–4.0)
MCH: 34.9 pg — ABNORMAL HIGH (ref 26.0–34.0)
MCHC: 32.1 g/dL (ref 30.0–36.0)
MCV: 109 fL — ABNORMAL HIGH (ref 80.0–100.0)
Monocytes Absolute: 0.4 K/uL (ref 0.1–1.0)
Monocytes Relative: 9 %
Neutro Abs: 3.4 K/uL (ref 1.7–7.7)
Neutrophils Relative %: 75 %
Platelet Count: 37 K/uL — ABNORMAL LOW (ref 150–400)
RBC: 3.12 MIL/uL — ABNORMAL LOW (ref 3.87–5.11)
RDW: 19.9 % — ABNORMAL HIGH (ref 11.5–15.5)
WBC Count: 4.5 K/uL (ref 4.0–10.5)
nRBC: 0 % (ref 0.0–0.2)

## 2024-05-22 MED ORDER — SODIUM CHLORIDE 0.9 % IV SOLN: INTRAVENOUS | Status: DC

## 2024-05-22 MED ORDER — SODIUM CHLORIDE 0.9 % IV SOLN
5.0000 mg/kg | Freq: Once | INTRAVENOUS | Status: AC
  Administered 2024-05-22: 300 mg via INTRAVENOUS
  Filled 2024-05-22: qty 12

## 2024-05-22 NOTE — Telephone Encounter (Signed)
 Pt restarting Lonsurf  today, after 3 week break. She takes the Lonsurf  after breakfast, and then again after supper. She has intermittent nausea, & occasional emesis - she has antiemetics at home & uses them appropriately. No appetite - has to make herself eat small bites here and there. Pt is fatigued as expected. Pt reminded to call us  if she develops temp of 100.4 or higher, day or night. She verbalized understanding.

## 2024-05-22 NOTE — Telephone Encounter (Signed)
 Patient called in inquiring about dosage increase. She would like to increase Trazedone to 150mg . Ph: [phone] appt 2/11

## 2024-05-22 NOTE — Patient Instructions (Signed)
 Bevacizumab Injection What is this medication? BEVACIZUMAB (be va SIZ yoo mab) treats some types of cancer. It works by blocking a protein that causes cancer cells to grow and multiply. This helps to slow or stop the spread of cancer cells. It is a monoclonal antibody. This medicine may be used for other purposes; ask your health care provider or pharmacist if you have questions. COMMON BRAND NAME(S): Alymsys, Avastin, MVASI, Rosaland Lao What should I tell my care team before I take this medication? They need to know if you have any of these conditions: Blood clots Coughing up blood Having or recent surgery Heart failure High blood pressure History of a connection between 2 or more body parts that do not usually connect (fistula) History of a tear in your stomach or intestines Protein in your urine An unusual or allergic reaction to bevacizumab, other medications, foods, dyes, or preservatives Pregnant or trying to get pregnant Breast-feeding How should I use this medication? This medication is injected into a vein. It is given by your care team in a hospital or clinic setting. Talk to your care team the use of this medication in children. Special care may be needed. Overdosage: If you think you have taken too much of this medicine contact a poison control center or emergency room at once. NOTE: This medicine is only for you. Do not share this medicine with others. What if I miss a dose? Keep appointments for follow-up doses. It is important not to miss your dose. Call your care team if you are unable to keep an appointment. What may interact with this medication? Interactions are not expected. This list may not describe all possible interactions. Give your health care provider a list of all the medicines, herbs, non-prescription drugs, or dietary supplements you use. Also tell them if you smoke, drink alcohol, or use illegal drugs. Some items may interact with your medicine. What  should I watch for while using this medication? Your condition will be monitored carefully while you are receiving this medication. You may need blood work while taking this medication. This medication may make you feel generally unwell. This is not uncommon as chemotherapy can affect healthy cells as well as cancer cells. Report any side effects. Continue your course of treatment even though you feel ill unless your care team tells you to stop. This medication may increase your risk to bruise or bleed. Call your care team if you notice any unusual bleeding. Before having surgery, talk to your care team to make sure it is ok. This medication can increase the risk of poor healing of your surgical site or wound. You will need to stop this medication for 28 days before surgery. After surgery, wait at least 28 days before restarting this medication. Make sure the surgical site or wound is healed enough before restarting this medication. Talk to your care team if questions. Talk to your care team if you may be pregnant. Serious birth defects can occur if you take this medication during pregnancy and for 6 months after the last dose. Contraception is recommended while taking this medication and for 6 months after the last dose. Your care team can help you find the option that works for you. Do not breastfeed while taking this medication and for 6 months after the last dose. This medication can cause infertility. Talk to your care team if you are concerned about your fertility. What side effects may I notice from receiving this medication? Side effects that you should  report to your care team as soon as possible: Allergic reactions--skin rash, itching, hives, swelling of the face, lips, tongue, or throat Bleeding--bloody or black, tar-like stools, vomiting blood or brown material that looks like coffee grounds, red or dark brown urine, small red or purple spots on skin, unusual bruising or bleeding Blood  clot--pain, swelling, or warmth in the leg, shortness of breath, chest pain Heart attack--pain or tightness in the chest, shoulders, arms, or jaw, nausea, shortness of breath, cold or clammy skin, feeling faint or lightheaded Heart failure--shortness of breath, swelling of the ankles, feet, or hands, sudden weight gain, unusual weakness or fatigue Increase in blood pressure Infection--fever, chills, cough, sore throat, wounds that don't heal, pain or trouble when passing urine, general feeling of discomfort or being unwell Infusion reactions--chest pain, shortness of breath or trouble breathing, feeling faint or lightheaded Kidney injury--decrease in the amount of urine, swelling of the ankles, hands, or feet Stomach pain that is severe, does not go away, or gets worse Stroke--sudden numbness or weakness of the face, arm, or leg, trouble speaking, confusion, trouble walking, loss of balance or coordination, dizziness, severe headache, change in vision Sudden and severe headache, confusion, change in vision, seizures, which may be signs of posterior reversible encephalopathy syndrome (PRES) Side effects that usually do not require medical attention (report to your care team if they continue or are bothersome): Back pain Change in taste Diarrhea Dry skin Increased tears Nosebleed This list may not describe all possible side effects. Call your doctor for medical advice about side effects. You may report side effects to FDA at 1-800-FDA-1088. Where should I keep my medication? This medication is given in a hospital or clinic. It will not be stored at home. NOTE: This sheet is a summary. It may not cover all possible information. If you have questions about this medicine, talk to your doctor, pharmacist, or health care provider.  2024 Elsevier/Gold Standard (2021-10-03 00:00:00)

## 2024-05-23 ENCOUNTER — Inpatient Hospital Stay

## 2024-05-23 NOTE — Progress Notes (Signed)
 [facility] CSW Progress Note  Clinical Child Psychotherapist returned patient's request to speak regarding medications. Patient change in insurance impacting copay cost of oral chemotherapy. CSW reached out to Oral Pharmacy team, who confirmed they are aware of change in cost. Pharmacy team will apply for financial assistance in January. No additional options to cover medications for January. Patient made aware. Patient will contact triage nurse to have applications signed for financial assistance.    Shannon Sprague, LCSW Clinical Social Worker [facility]

## 2024-05-26 ENCOUNTER — Telehealth: Payer: Self-pay

## 2024-05-26 NOTE — Telephone Encounter (Signed)
 Oral Oncology Patient Advocate Encounter   Began application for assistance for trifluridine-tipiracil (LONSURF ) 20-8.19 MG tablet  through [employer].   Application will be submitted upon completion of necessary supporting documentation.   [employer] phone number [phone].   I will continue to check the status until final determination.   Lucie Lamer, CPhT [place]  [facility] Specialty Pharmacy Services Oncology Pharmacy Patient Advocate Specialist II [facility] [place] Phone: [phone]  Fax: [phone] [email or web address]

## 2024-05-29 ENCOUNTER — Telehealth: Payer: Self-pay

## 2024-05-29 ENCOUNTER — Inpatient Hospital Stay

## 2024-05-29 ENCOUNTER — Other Ambulatory Visit: Payer: Self-pay | Admitting: Hematology and Oncology

## 2024-05-29 MED ORDER — LORAZEPAM 0.5 MG PO TABS: 0.5000 mg | ORAL_TABLET | Freq: Three times a day (TID) | ORAL | 3 refills | Status: AC | PRN

## 2024-05-29 NOTE — Telephone Encounter (Signed)
 Dr. Melvyn Neth aware.

## 2024-05-29 NOTE — Telephone Encounter (Signed)
 LVM to RC. Currently taking 100 mg trazodone .

## 2024-05-30 ENCOUNTER — Other Ambulatory Visit (HOSPITAL_COMMUNITY): Payer: Self-pay

## 2024-05-30 ENCOUNTER — Telehealth: Payer: Self-pay

## 2024-05-30 ENCOUNTER — Encounter: Payer: Self-pay | Admitting: Oncology

## 2024-05-30 ENCOUNTER — Telehealth: Payer: Self-pay | Admitting: Psychiatry

## 2024-05-30 NOTE — Telephone Encounter (Signed)
 Patient applied for Patient Assistance with [employer] for the drug Auvelity . She DOES NOT qualify for assistance at this time due to income. No other alternative. Will need to provide samples.

## 2024-05-30 NOTE — Telephone Encounter (Signed)
 She has samples for now.

## 2024-05-30 NOTE — Telephone Encounter (Signed)
 Oral Oncology Patient Advocate Encounter   Received notification that prior authorization for trifluridine-tipiracil (LONSURF ) 20-8.19 MG tablet  is required with new insurance [employer].   PA submitted on 05/30/24 Key [id number] Status is pending     Lucie Lamer, CPhT [place]  [facility] Specialty Pharmacy Services Oncology Pharmacy Patient Advocate Specialist [facility] [place] Phone: [phone]  Fax: [phone] [email or web address]

## 2024-05-31 ENCOUNTER — Other Ambulatory Visit: Payer: Self-pay | Admitting: Hematology and Oncology

## 2024-05-31 ENCOUNTER — Encounter: Payer: Self-pay | Admitting: Oncology

## 2024-05-31 ENCOUNTER — Other Ambulatory Visit: Payer: Self-pay

## 2024-05-31 MED ORDER — OLANZAPINE 5 MG PO TABS: 5.0000 mg | ORAL_TABLET | Freq: Every day | ORAL | 1 refills | Status: DC

## 2024-05-31 MED ORDER — TRAZODONE HCL 150 MG PO TABS: 150.0000 mg | ORAL_TABLET | Freq: Every evening | ORAL | 1 refills | Status: AC | PRN

## 2024-05-31 NOTE — Telephone Encounter (Signed)
 Pt reports trouble with sleep, both getting to sleep and staying asleep. She has self-increased trazodone  to 150 mg. Her oncologist prescribes olanzapine  as well. She reports she takes both nightly and that the increase in trazodone  has been helpful. She said she had cleared this with you previously. Wanted to be it was ok before a new Rx was sent.

## 2024-05-31 NOTE — Telephone Encounter (Signed)
 Yes, ok 150 mg trazodone .

## 2024-05-31 NOTE — Telephone Encounter (Signed)
 Oral Oncology Patient Advocate Encounter  Prior Authorization for Lonsurf  has been approved.    PA# [id number] Effective dates: 05/30/24 through 05/30/25  Patients co-pay is $1,205.43 .    Lucie Lamer, CPhT [place]  [facility] Specialty Pharmacy Services Oncology Pharmacy Patient Advocate Specialist [facility] [place] Phone: [phone]  Fax: [phone] [email or web address]

## 2024-05-31 NOTE — Telephone Encounter (Signed)
 Sent in Rx for 150 mg tablet of trazodone  and patient notified.

## 2024-06-02 ENCOUNTER — Telehealth: Payer: Self-pay

## 2024-06-02 NOTE — Telephone Encounter (Signed)
 Oral Oncology Patient Advocate Encounter   Received notification that the application for assistance for  trifluridine-tipiracil (LONSURF ) 20-8.19 MG tablet  through [employer] has been approved.   [employer] phone number [phone].    Effective dates: 06/02/24 through 05/31/25  Medication will be filled at [facility]. Patient was advised they will giver her a call with 72 business hours to get fill scheduled.   Shannon [name], CPhT [place]  [facility] Specialty Pharmacy Services Oncology Pharmacy Patient Advocate Specialist [facility] [place] Phone: [phone]  Fax: [phone] [email or web address]

## 2024-06-02 NOTE — Telephone Encounter (Signed)
 I called pt to see how she was feeling. She only had enough Lonsurf  to complete the 1st week of cycle 3. The copay for the Lonsurf  is $388/month and she can't afford that. Pharmacy staff are hoping for grant to open up soon to help her w/cost. She doesn't have an appetite @ all - has to make herself eat a little something here and there. Smells do bother her intermittently. She continues to have intermittent productive cough- whitish yellow in color. She does not take anything OTC for this. See the chemo f/u call flowsheet for further symptom.side effect assessment. Pt reminded to call us  if she were to develop temp of 100.4 or higher,day or night. We confirmed her appt's for 06/05/2024.

## 2024-06-04 NOTE — Progress Notes (Unsigned)
 "                [facility] at [facility] [address] [place],  [place]  [address] [phone]    Clinic Day:  05/15/2024  Referring physician: Glenford Harlene SAILOR, NP  HISTORY OF PRESENT ILLNESS:  The patient is a 59 y.o. female with metastatic colon cancer, which includes spread of disease to her abdominal cavity, lungs, brain, liver, and right adrenal gland.  She comes in today to be evaluated before heading into her 3rd cycle of Lonsurf /Avastin .  Of note, recent CT scans after her 1st cycle of Lonsurf  showed evidence of ascites.  However, a recent paracentesis did not reveal malignant cells.  The patient claims that abdominal fluid is gradually starting to rebuild within her abdomen.  However, it is not to the point where it is kind of uncomfortable or painful to wear a repeat paracentesis needs to be considered.  The patient claims to tolerate her second cycle of Lonsurf /Avastin  fairly well.  Understandably, with her worsening metastatic disease, she is concerned about both her quality and quantity of life.  With respect to her colon cancer history, she is status post a right hemicolectomy in early October 2018, followed by 12 cycles of adjuvant FOLFOX chemotherapy, which were completed in April 2019.  In December 2021, she underwent a left cerebellar metastasectomy, whose pathology was consistent with metastatic colon cancer.  Tumor testing did come back MMR normal. CT scans revealed evidence of her cancer being in multiple locations, for which she took 40 cycles of FOLFIRI/Avastin .  As follow-up scans revealed liver metastasis, she was placed back on FOLFOX/Avastin .  She underwent 30 cycles of this regimen before scans showed disease progression.  This led to her recently being switched to Lonsurf /Avastin .  PHYSICAL EXAM:  Last menstrual period 02/22/2019. There is no height or weight on file to calculate BMI.  Performance status (ECOG): 1 - Symptomatic but completely  ambulatory  Physical Exam Vitals and nursing note reviewed.  Constitutional:      General: She is not in acute distress.    Appearance: Normal appearance.     Comments:    HENT:     Head: Normocephalic and atraumatic.     Mouth/Throat:     Mouth: Mucous membranes are moist.     Pharynx: Oropharynx is clear. No oropharyngeal exudate or posterior oropharyngeal erythema.  Eyes:     General: No scleral icterus.    Extraocular Movements: Extraocular movements intact.     Conjunctiva/sclera: Conjunctivae normal.     Pupils: Pupils are equal, round, and reactive to light.  Cardiovascular:     Rate and Rhythm: Normal rate and regular rhythm.     Heart sounds: Normal heart sounds. No murmur heard.    No friction rub. No gallop.  Pulmonary:     Effort: Pulmonary effort is normal.     Breath sounds: Normal breath sounds. No wheezing, rhonchi or rales.  Abdominal:     General: There is distension (Mild).     Palpations: Abdomen is soft. There is fluid wave. There is no hepatomegaly, splenomegaly or mass.     Tenderness: There is no abdominal tenderness.  Musculoskeletal:        General: Normal range of motion.     Cervical back: Normal range of motion and neck supple. No tenderness.     Right lower leg: No edema.     Left lower leg: No edema.  Lymphadenopathy:  Cervical: No cervical adenopathy.     Upper Body:     Right upper body: No supraclavicular or axillary adenopathy.     Left upper body: No supraclavicular or axillary adenopathy.     Lower Body: No right inguinal adenopathy. No left inguinal adenopathy.  Skin:    General: Skin is warm and dry.     Coloration: Skin is not jaundiced.     Findings: No rash.  Neurological:     Mental Status: She is alert and oriented to person, place, and time.     Cranial Nerves: No cranial nerve deficit.  Psychiatric:        Mood and Affect: Mood normal.        Behavior: Behavior normal.        Thought Content: Thought content normal.     LABS:      Latest Ref Rng & Units 05/22/2024    1:54 PM 05/15/2024    9:43 AM 05/01/2024   10:14 AM  CBC  WBC 4.0 - 10.5 K/uL 4.5  1.2  2.0   Hemoglobin 12.0 - 15.0 g/dL 89.0  9.5  7.3   Hematocrit 36.0 - 46.0 % 34.0  29.1  21.9   Platelets 150 - 400 K/uL 37  23  24     Latest Reference Range & Units 05/15/24 09:43  Neutrophils % 29  Lymphocytes % 48  Monocytes Relative % 20  Eosinophil % 2  Basophil % 0  Immature Granulocytes % 1  NEUT# 1.7 - 7.7 K/uL 0.4 (LL)  (LL): Data is critically low    Latest Ref Rng & Units 05/22/2024    1:54 PM 05/15/2024    9:43 AM 05/01/2024   10:14 AM  CMP  Glucose 70 - 99 mg/dL 810  85  810   BUN 6 - 20 mg/dL 9  15  17    Creatinine 0.44 - 1.00 mg/dL 8.69  8.80  8.72   Sodium 135 - 145 mmol/L 142  142  137   Potassium 3.5 - 5.1 mmol/L 4.2  4.0  4.3   Chloride 98 - 111 mmol/L 107  108  107   CO2 22 - 32 mmol/L 26  26  23    Calcium  8.9 - 10.3 mg/dL 8.5  8.7  8.2   Total Protein 6.5 - 8.1 g/dL 5.7  5.5  5.1   Total Bilirubin 0.0 - 1.2 mg/dL 0.5  0.6  0.3   Alkaline Phos 38 - 126 U/L 624  606  449   AST 15 - 41 U/L 52  80  37   ALT 0 - 44 U/L 32  58  25     Latest Reference Range & Units 12/15/23 09:46 03/13/24 10:20 04/10/24 13:19  CEA ([facility]) 0.00 - 5.00 ng/mL 66.98 (H) 113.41 (H) 104.23 (H)  (H): Data is abnormally high  SCANS:  CT scans of her abdomen/pelvis on 04-14-24 revealed the following:     ASSESSMENT & PLAN:  Assessment/Plan:  A 59 y.o. female with metastatic colon cancer.  Due to a low absolute neutrophil count and thrombocytopenia, I have told the patient to hold her Lonsurf /Avastin  for 1 week.  Hopefully, this will be enough time to get her platelets back up to more suitable levels.  With respect to her severe neutropenia, I will have the patient receive Zarzio both today and tomorrow.  Her third cycle of Lonsurf /Avastin , barring any changes, will commence on December 22nd.  I strongly encouraged the patient to contact  our office over these next few weeks if her ascites becomes more prominent to where a repeat paracentesis would need to be considered.  Of note, as she does feel somewhat dry today, I will arrange for her to receive 1 L of IV fluids.  Otherwise, I will see her back in mid January 2026 to clinically reassess her before she heads into her fourth cycle of Lonsurf /Avastin .   The patient understands all the plans discussed today and is in agreement with them.  Gedalia Mcmillon DELENA Kerns, MD        "

## 2024-06-05 ENCOUNTER — Telehealth: Payer: Self-pay | Admitting: Oncology

## 2024-06-05 ENCOUNTER — Telehealth: Payer: Self-pay

## 2024-06-05 ENCOUNTER — Other Ambulatory Visit: Payer: Self-pay | Admitting: Pharmacist

## 2024-06-05 ENCOUNTER — Inpatient Hospital Stay (HOSPITAL_BASED_OUTPATIENT_CLINIC_OR_DEPARTMENT_OTHER): Admitting: Oncology

## 2024-06-05 ENCOUNTER — Inpatient Hospital Stay: Attending: Oncology

## 2024-06-05 ENCOUNTER — Other Ambulatory Visit: Payer: Self-pay | Admitting: Oncology

## 2024-06-05 ENCOUNTER — Inpatient Hospital Stay

## 2024-06-05 VITALS — BP 131/66 | HR 83 | Temp 98.5°F | Resp 14 | Ht 68.0 in | Wt 145.0 lb

## 2024-06-05 VITALS — BP 129/67 | HR 80 | Resp 18

## 2024-06-05 DIAGNOSIS — C786 Secondary malignant neoplasm of retroperitoneum and peritoneum: Secondary | ICD-10-CM | POA: Diagnosis not present

## 2024-06-05 DIAGNOSIS — C182 Malignant neoplasm of ascending colon: Secondary | ICD-10-CM | POA: Insufficient documentation

## 2024-06-05 DIAGNOSIS — C7931 Secondary malignant neoplasm of brain: Secondary | ICD-10-CM | POA: Diagnosis not present

## 2024-06-05 DIAGNOSIS — R110 Nausea: Secondary | ICD-10-CM

## 2024-06-05 DIAGNOSIS — C7971 Secondary malignant neoplasm of right adrenal gland: Secondary | ICD-10-CM | POA: Diagnosis not present

## 2024-06-05 DIAGNOSIS — Z5111 Encounter for antineoplastic chemotherapy: Secondary | ICD-10-CM | POA: Diagnosis present

## 2024-06-05 DIAGNOSIS — C7801 Secondary malignant neoplasm of right lung: Secondary | ICD-10-CM | POA: Diagnosis not present

## 2024-06-05 DIAGNOSIS — C787 Secondary malignant neoplasm of liver and intrahepatic bile duct: Secondary | ICD-10-CM | POA: Diagnosis not present

## 2024-06-05 DIAGNOSIS — C7802 Secondary malignant neoplasm of left lung: Secondary | ICD-10-CM | POA: Diagnosis not present

## 2024-06-05 DIAGNOSIS — E860 Dehydration: Secondary | ICD-10-CM

## 2024-06-05 DIAGNOSIS — C189 Malignant neoplasm of colon, unspecified: Secondary | ICD-10-CM | POA: Insufficient documentation

## 2024-06-05 DIAGNOSIS — D6481 Anemia due to antineoplastic chemotherapy: Secondary | ICD-10-CM | POA: Insufficient documentation

## 2024-06-05 LAB — CBC WITH DIFFERENTIAL (CANCER CENTER ONLY)
Abs Immature Granulocytes: 0 K/uL (ref 0.00–0.07)
Basophils Absolute: 0 K/uL (ref 0.0–0.1)
Basophils Relative: 0 %
Eosinophils Absolute: 0.1 K/uL (ref 0.0–0.5)
Eosinophils Relative: 3 %
HCT: 29 % — ABNORMAL LOW (ref 36.0–46.0)
Hemoglobin: 9.4 g/dL — ABNORMAL LOW (ref 12.0–15.0)
Immature Granulocytes: 0 %
Lymphocytes Relative: 23 %
Lymphs Abs: 0.5 K/uL — ABNORMAL LOW (ref 0.7–4.0)
MCH: 35.3 pg — ABNORMAL HIGH (ref 26.0–34.0)
MCHC: 32.4 g/dL (ref 30.0–36.0)
MCV: 109 fL — ABNORMAL HIGH (ref 80.0–100.0)
Monocytes Absolute: 0.2 K/uL (ref 0.1–1.0)
Monocytes Relative: 11 %
Neutro Abs: 1.3 K/uL — ABNORMAL LOW (ref 1.7–7.7)
Neutrophils Relative %: 63 %
Platelet Count: 51 K/uL — ABNORMAL LOW (ref 150–400)
RBC: 2.66 MIL/uL — ABNORMAL LOW (ref 3.87–5.11)
RDW: 18.9 % — ABNORMAL HIGH (ref 11.5–15.5)
WBC Count: 2.1 K/uL — ABNORMAL LOW (ref 4.0–10.5)
nRBC: 0 % (ref 0.0–0.2)

## 2024-06-05 LAB — CMP (CANCER CENTER ONLY)
ALT: 38 U/L (ref 0–44)
AST: 53 U/L — ABNORMAL HIGH (ref 15–41)
Albumin: 3.1 g/dL — ABNORMAL LOW (ref 3.5–5.0)
Alkaline Phosphatase: 645 U/L — ABNORMAL HIGH (ref 38–126)
Anion gap: 8 (ref 5–15)
BUN: 16 mg/dL (ref 6–20)
CO2: 27 mmol/L (ref 22–32)
Calcium: 8.7 mg/dL — ABNORMAL LOW (ref 8.9–10.3)
Chloride: 106 mmol/L (ref 98–111)
Creatinine: 1.09 mg/dL — ABNORMAL HIGH (ref 0.44–1.00)
GFR, Estimated: 59 mL/min — ABNORMAL LOW
Glucose, Bld: 133 mg/dL — ABNORMAL HIGH (ref 70–99)
Potassium: 4.3 mmol/L (ref 3.5–5.1)
Sodium: 141 mmol/L (ref 135–145)
Total Bilirubin: 0.7 mg/dL (ref 0.0–1.2)
Total Protein: 5.6 g/dL — ABNORMAL LOW (ref 6.5–8.1)

## 2024-06-05 LAB — TOTAL PROTEIN, URINE DIPSTICK: Protein, ur: NEGATIVE mg/dL

## 2024-06-05 MED ORDER — SODIUM CHLORIDE 0.9 % IV SOLN: Freq: Once | INTRAVENOUS | Status: AC

## 2024-06-05 MED ORDER — EPOETIN ALFA-EPBX 20000 UNIT/ML IJ SOLN
20000.0000 [IU] | Freq: Once | INTRAMUSCULAR | Status: AC
  Administered 2024-06-05: 20000 [IU] via SUBCUTANEOUS
  Filled 2024-06-05: qty 1

## 2024-06-05 MED ORDER — SODIUM CHLORIDE 0.9 % IV SOLN
5.0000 mg/kg | Freq: Once | INTRAVENOUS | Status: AC
  Administered 2024-06-05: 300 mg via INTRAVENOUS
  Filled 2024-06-05: qty 12

## 2024-06-05 NOTE — Telephone Encounter (Signed)
 Patient has been scheduled for follow-up visit per 06/05/2024 LOS.  Pt given an appt calendar with date and time.

## 2024-06-05 NOTE — Telephone Encounter (Signed)
 Pt here for clinic f/u. Pt has not received the new shipment of Lonsurf . She has multiple episodes of diarrhea daily. She doesn't take Imodium unless she is going to be out of the house, as she is easy to constipate. She is now wearing adult briefs. She has hemorrhoids- and is using hemorrhoidal creams. We discussed the option of doing sitz baths as well for comfort. She admits to vomiting maybe once weekly. No appetite- has to make self eat. Odors do play a part in her poor appetite. She lost 4.3# since last visit. The only nausea meds she uses are the olanzapine  and ativan  (the other do not help- zofran  and phenergan , compazine ). I reminded her that the nausea meds can be constipating. She hasn't been sleeping very well. She tells me that her psychiatrist increased her trazodone  to 150mg  po q HS, and it has helped. She does require naps through out the day. She has intermittent lower abd pain when standing, other wise no pain. She tells me she thinks she needs IVF today, as her urine is dark in color and she hasn't been able to drink as much, Dr Ezzard notified of all above.

## 2024-06-05 NOTE — Progress Notes (Signed)
 Ok to proceed with bevacizumab  today despite plt=51 and ANC=1.3 per Dr. Ezzard.  We will hold off on zarxio  injections for now, but will add retacrit  for today to help with anemia.  Patient will also receive 1L NS.

## 2024-06-05 NOTE — Patient Instructions (Signed)
 Bevacizumab Injection What is this medication? BEVACIZUMAB (be va SIZ yoo mab) treats some types of cancer. It works by blocking a protein that causes cancer cells to grow and multiply. This helps to slow or stop the spread of cancer cells. It is a monoclonal antibody. This medicine may be used for other purposes; ask your health care provider or pharmacist if you have questions. COMMON BRAND NAME(S): Alymsys, Avastin, MVASI, Rosaland Lao What should I tell my care team before I take this medication? They need to know if you have any of these conditions: Blood clots Coughing up blood Having or recent surgery Heart failure High blood pressure History of a connection between 2 or more body parts that do not usually connect (fistula) History of a tear in your stomach or intestines Protein in your urine An unusual or allergic reaction to bevacizumab, other medications, foods, dyes, or preservatives Pregnant or trying to get pregnant Breast-feeding How should I use this medication? This medication is injected into a vein. It is given by your care team in a hospital or clinic setting. Talk to your care team the use of this medication in children. Special care may be needed. Overdosage: If you think you have taken too much of this medicine contact a poison control center or emergency room at once. NOTE: This medicine is only for you. Do not share this medicine with others. What if I miss a dose? Keep appointments for follow-up doses. It is important not to miss your dose. Call your care team if you are unable to keep an appointment. What may interact with this medication? Interactions are not expected. This list may not describe all possible interactions. Give your health care provider a list of all the medicines, herbs, non-prescription drugs, or dietary supplements you use. Also tell them if you smoke, drink alcohol, or use illegal drugs. Some items may interact with your medicine. What  should I watch for while using this medication? Your condition will be monitored carefully while you are receiving this medication. You may need blood work while taking this medication. This medication may make you feel generally unwell. This is not uncommon as chemotherapy can affect healthy cells as well as cancer cells. Report any side effects. Continue your course of treatment even though you feel ill unless your care team tells you to stop. This medication may increase your risk to bruise or bleed. Call your care team if you notice any unusual bleeding. Before having surgery, talk to your care team to make sure it is ok. This medication can increase the risk of poor healing of your surgical site or wound. You will need to stop this medication for 28 days before surgery. After surgery, wait at least 28 days before restarting this medication. Make sure the surgical site or wound is healed enough before restarting this medication. Talk to your care team if questions. Talk to your care team if you may be pregnant. Serious birth defects can occur if you take this medication during pregnancy and for 6 months after the last dose. Contraception is recommended while taking this medication and for 6 months after the last dose. Your care team can help you find the option that works for you. Do not breastfeed while taking this medication and for 6 months after the last dose. This medication can cause infertility. Talk to your care team if you are concerned about your fertility. What side effects may I notice from receiving this medication? Side effects that you should  report to your care team as soon as possible: Allergic reactions--skin rash, itching, hives, swelling of the face, lips, tongue, or throat Bleeding--bloody or black, tar-like stools, vomiting blood or brown material that looks like coffee grounds, red or dark brown urine, small red or purple spots on skin, unusual bruising or bleeding Blood  clot--pain, swelling, or warmth in the leg, shortness of breath, chest pain Heart attack--pain or tightness in the chest, shoulders, arms, or jaw, nausea, shortness of breath, cold or clammy skin, feeling faint or lightheaded Heart failure--shortness of breath, swelling of the ankles, feet, or hands, sudden weight gain, unusual weakness or fatigue Increase in blood pressure Infection--fever, chills, cough, sore throat, wounds that don't heal, pain or trouble when passing urine, general feeling of discomfort or being unwell Infusion reactions--chest pain, shortness of breath or trouble breathing, feeling faint or lightheaded Kidney injury--decrease in the amount of urine, swelling of the ankles, hands, or feet Stomach pain that is severe, does not go away, or gets worse Stroke--sudden numbness or weakness of the face, arm, or leg, trouble speaking, confusion, trouble walking, loss of balance or coordination, dizziness, severe headache, change in vision Sudden and severe headache, confusion, change in vision, seizures, which may be signs of posterior reversible encephalopathy syndrome (PRES) Side effects that usually do not require medical attention (report to your care team if they continue or are bothersome): Back pain Change in taste Diarrhea Dry skin Increased tears Nosebleed This list may not describe all possible side effects. Call your doctor for medical advice about side effects. You may report side effects to FDA at 1-800-FDA-1088. Where should I keep my medication? This medication is given in a hospital or clinic. It will not be stored at home. NOTE: This sheet is a summary. It may not cover all possible information. If you have questions about this medicine, talk to your doctor, pharmacist, or health care provider.  2024 Elsevier/Gold Standard (2021-10-03 00:00:00)

## 2024-06-06 LAB — CEA (ACCESS): CEA (CHCC): 118.4 ng/mL — ABNORMAL HIGH (ref 0.00–5.00)

## 2024-06-07 ENCOUNTER — Telehealth: Payer: Self-pay

## 2024-06-07 NOTE — Telephone Encounter (Signed)
 I called pt to see if she had received the Lonsurf . She had, and has already taken morning doses.

## 2024-06-08 ENCOUNTER — Inpatient Hospital Stay: Admitting: Dietician

## 2024-06-08 NOTE — Progress Notes (Signed)
 Nutrition Follow Up:  Reached out to patient at her home/cell phone.  She relayed struggling with nausea and vomiting and bowels both constipation and diarrhea with loose stools 5+ per day.  She started using an OTC digestive enzyme replacement called digest GI.  Feels she is getting some relief with their use.  Olanzapine  works better for her nausea and she does tolerate Gingerale.  She uses Imodium but only when she is going somewhere.    Anthropometrics: weight loss 4# past 2 weeks  Height: 68 Weight:  06/06/23  145# 05/15/24  149.3 UBW: 152-154# BMI: 22.05    NUTRITION DIAGNOSIS: Inadequate PO intake to meet EEN r/t nausea and vomiting and irregular bowels   INTERVENTION:  Reviewed strategies for nausea. Small frequent feeds, ventilation to decrease smells, lean protein bland non greasy foods Encouraged soluble fiber and consideration for use of probiotic supplement. Discussed strategies for managing diarrhea. Encouraged her to ask for samples of Banatrol when she goes for her infusion or consider a fiber supplement like metamucil. Emailed tip sheet for nausea and diarrhea  MONITORING, EVALUATION, GOAL: weight, PO intake, Nutrition Impact Symptoms, labs Goal is weight maintenance  Next Visit: Remote next week  Micheline Craven, RDN, LDN Registered Dietitian, [facility] Part Time Remote (Usual office hours: Tuesday-Thursday) Cell: [phone]

## 2024-06-09 ENCOUNTER — Other Ambulatory Visit: Payer: Self-pay | Admitting: Psychiatry

## 2024-06-09 DIAGNOSIS — G3184 Mild cognitive impairment, so stated: Secondary | ICD-10-CM

## 2024-06-09 DIAGNOSIS — R530 Neoplastic (malignant) related fatigue: Secondary | ICD-10-CM

## 2024-06-09 DIAGNOSIS — F333 Major depressive disorder, recurrent, severe with psychotic symptoms: Secondary | ICD-10-CM

## 2024-06-12 ENCOUNTER — Inpatient Hospital Stay

## 2024-06-12 ENCOUNTER — Inpatient Hospital Stay: Admitting: Oncology

## 2024-06-12 ENCOUNTER — Telehealth: Payer: Self-pay

## 2024-06-12 NOTE — Telephone Encounter (Signed)
 PA approved for Modafinil  200 mg effective 06/10/24-12/08/24  [employer] PA- [id number] Pt ID# [id number]

## 2024-06-14 ENCOUNTER — Telehealth: Payer: Self-pay | Admitting: Dietician

## 2024-06-14 ENCOUNTER — Inpatient Hospital Stay: Admitting: Dietician

## 2024-06-14 NOTE — Telephone Encounter (Signed)
 Attempted to reach patient for a scheduled remote nutrition follow up. Provided my cell# on voice mail to return call for her follow up nutrition consult.  Micheline Craven, RDN, LDN Registered Dietitian, [facility] Part Time Remote (Usual office hours: Tuesday-Thursday) Cell: [phone]

## 2024-06-19 ENCOUNTER — Encounter: Payer: Self-pay | Admitting: Oncology

## 2024-06-19 ENCOUNTER — Inpatient Hospital Stay

## 2024-06-19 ENCOUNTER — Other Ambulatory Visit: Payer: Self-pay

## 2024-06-19 VITALS — BP 123/64 | HR 82 | Temp 98.6°F | Resp 18

## 2024-06-19 VITALS — BP 127/63 | HR 82 | Temp 98.8°F | Resp 18 | Ht 68.0 in | Wt 146.5 lb

## 2024-06-19 DIAGNOSIS — C7931 Secondary malignant neoplasm of brain: Secondary | ICD-10-CM

## 2024-06-19 DIAGNOSIS — C787 Secondary malignant neoplasm of liver and intrahepatic bile duct: Secondary | ICD-10-CM

## 2024-06-19 DIAGNOSIS — Z5111 Encounter for antineoplastic chemotherapy: Secondary | ICD-10-CM | POA: Diagnosis not present

## 2024-06-19 DIAGNOSIS — C182 Malignant neoplasm of ascending colon: Secondary | ICD-10-CM

## 2024-06-19 DIAGNOSIS — C7800 Secondary malignant neoplasm of unspecified lung: Secondary | ICD-10-CM

## 2024-06-19 LAB — CBC WITH DIFFERENTIAL (CANCER CENTER ONLY)
Abs Immature Granulocytes: 0.01 K/uL (ref 0.00–0.07)
Basophils Absolute: 0 K/uL (ref 0.0–0.1)
Basophils Relative: 0 %
Eosinophils Absolute: 0.1 K/uL (ref 0.0–0.5)
Eosinophils Relative: 3 %
HCT: 28.3 % — ABNORMAL LOW (ref 36.0–46.0)
Hemoglobin: 9.1 g/dL — ABNORMAL LOW (ref 12.0–15.0)
Immature Granulocytes: 1 %
Lymphocytes Relative: 24 %
Lymphs Abs: 0.5 K/uL — ABNORMAL LOW (ref 0.7–4.0)
MCH: 35.8 pg — ABNORMAL HIGH (ref 26.0–34.0)
MCHC: 32.2 g/dL (ref 30.0–36.0)
MCV: 111.4 fL — ABNORMAL HIGH (ref 80.0–100.0)
Monocytes Absolute: 0.3 K/uL (ref 0.1–1.0)
Monocytes Relative: 13 %
Neutro Abs: 1.3 K/uL — ABNORMAL LOW (ref 1.7–7.7)
Neutrophils Relative %: 59 %
Platelet Count: 36 K/uL — ABNORMAL LOW (ref 150–400)
RBC: 2.54 MIL/uL — ABNORMAL LOW (ref 3.87–5.11)
RDW: 17.8 % — ABNORMAL HIGH (ref 11.5–15.5)
WBC Count: 2.1 K/uL — ABNORMAL LOW (ref 4.0–10.5)
nRBC: 0 % (ref 0.0–0.2)

## 2024-06-19 MED ORDER — DIPHENHYDRAMINE HCL 25 MG PO TABS: 25.0000 mg | ORAL_TABLET | Freq: Once | ORAL | Status: DC

## 2024-06-19 MED ORDER — SODIUM CHLORIDE 0.9% IV SOLUTION
250.0000 mL | INTRAVENOUS | Status: DC
  Administered 2024-06-19: 250 mL via INTRAVENOUS

## 2024-06-19 MED ORDER — SODIUM CHLORIDE 0.9 % IV SOLN
5.0000 mg/kg | Freq: Once | INTRAVENOUS | Status: AC
  Administered 2024-06-19: 300 mg via INTRAVENOUS
  Filled 2024-06-19: qty 12

## 2024-06-19 MED ORDER — SODIUM CHLORIDE 0.9 % IV SOLN: INTRAVENOUS | Status: DC

## 2024-06-19 MED ORDER — ONDANSETRON HCL 4 MG/2ML IJ SOLN
8.0000 mg | Freq: Once | INTRAMUSCULAR | Status: AC
  Administered 2024-06-19: 8 mg via INTRAVENOUS
  Filled 2024-06-19: qty 4

## 2024-06-19 MED ORDER — ACETAMINOPHEN 325 MG PO TABS: 650.0000 mg | ORAL_TABLET | Freq: Once | ORAL | Status: DC

## 2024-06-19 NOTE — Progress Notes (Signed)
 Per Dr. Ezzard ok to proceed with Bevacizumab  with ANC 1.3 and Plt 36. Per Dr. Ezzard patient needs 1 unit of Plt either today or tomorrow. Patient made aware.

## 2024-06-19 NOTE — Patient Instructions (Signed)
 [facility] - A DEPT OF [facility]  Discharge Instructions: Thank you for choosing [facility] to provide your oncology and hematology care.  If you have a lab appointment with the Cancer Center, please go directly to the Cancer Center and check in at the registration area.   Wear comfortable clothing and clothing appropriate for easy access to any Portacath or PICC line.   We strive to give you quality time with your provider. You may need to reschedule your appointment if you arrive late (15 or more minutes).  Arriving late affects you and other patients whose appointments are after yours.  Also, if you miss three or more appointments without notifying the office, you may be dismissed from the clinic at the providers discretion.      For prescription refill requests, have your pharmacy contact our office and allow 72 hours for refills to be completed.    Today you received the following chemotherapy and/or immunotherapy agents Bevacizumab        To help prevent nausea and vomiting after your treatment, we encourage you to take your nausea medication as directed.  BELOW ARE SYMPTOMS THAT SHOULD BE REPORTED IMMEDIATELY: *FEVER GREATER THAN 100.4 F (38 C) OR HIGHER *CHILLS OR SWEATING *NAUSEA AND VOMITING THAT IS NOT CONTROLLED WITH YOUR NAUSEA MEDICATION *UNUSUAL SHORTNESS OF BREATH *UNUSUAL BRUISING OR BLEEDING *URINARY PROBLEMS (pain or burning when urinating, or frequent urination) *BOWEL PROBLEMS (unusual diarrhea, constipation, pain near the anus) TENDERNESS IN MOUTH AND THROAT WITH OR WITHOUT PRESENCE OF ULCERS (sore throat, sores in mouth, or a toothache) UNUSUAL RASH, SWELLING OR PAIN  UNUSUAL VAGINAL DISCHARGE OR ITCHING   Items with * indicate a potential emergency and should be followed up as soon as possible or go to the Emergency Department if any problems should occur.  Please show the CHEMOTHERAPY ALERT CARD or IMMUNOTHERAPY  ALERT CARD at check-in to the Emergency Department and triage nurse.  Should you have questions after your visit or need to cancel or reschedule your appointment, please contact [facility] [place] [place] - A DEPT OF [facility]  Dept: [phone]  and follow the prompts.  Office hours are 8:00 a.m. to 4:30 p.m. Monday - Friday. Please note that voicemails left after 4:00 p.m. may not be returned until the following business day.  We are closed weekends and major holidays. You have access to a nurse at all times for urgent questions. Please call the main number to the clinic Dept: [phone] and follow the prompts.  For any non-urgent questions, you may also contact your provider using MyChart. We now offer e-Visits for anyone 63 and older to request care online for non-urgent symptoms. For details visit [email or web address].   Also download the MyChart app! Go to the app store, search MyChart, open the app, select [place], and log in with your MyChart username and password.

## 2024-06-20 LAB — PREPARE PLATELET PHERESIS: Unit division: 0

## 2024-06-20 LAB — BPAM PLATELET PHERESIS
Blood Product Expiration Date: 202601222359
ISSUE DATE / TIME: 202601191210
Unit Type and Rh: 5100

## 2024-06-24 ENCOUNTER — Other Ambulatory Visit: Payer: Self-pay | Admitting: Psychiatry

## 2024-06-26 ENCOUNTER — Inpatient Hospital Stay

## 2024-06-27 ENCOUNTER — Other Ambulatory Visit: Payer: Self-pay | Admitting: Hematology and Oncology

## 2024-06-27 ENCOUNTER — Other Ambulatory Visit: Payer: Self-pay | Admitting: Oncology

## 2024-06-27 ENCOUNTER — Other Ambulatory Visit: Payer: Self-pay

## 2024-06-27 DIAGNOSIS — R112 Nausea with vomiting, unspecified: Secondary | ICD-10-CM

## 2024-06-27 DIAGNOSIS — C7800 Secondary malignant neoplasm of unspecified lung: Secondary | ICD-10-CM

## 2024-06-27 DIAGNOSIS — C787 Secondary malignant neoplasm of liver and intrahepatic bile duct: Secondary | ICD-10-CM

## 2024-06-27 DIAGNOSIS — C182 Malignant neoplasm of ascending colon: Secondary | ICD-10-CM

## 2024-06-27 MED ORDER — OLANZAPINE 5 MG PO TABS: 5.0000 mg | ORAL_TABLET | Freq: Every day | ORAL | 1 refills | Status: DC

## 2024-06-27 MED ORDER — OLANZAPINE 5 MG PO TABS: 5.0000 mg | ORAL_TABLET | Freq: Every day | ORAL | 1 refills | Status: AC

## 2024-06-29 ENCOUNTER — Inpatient Hospital Stay

## 2024-06-29 ENCOUNTER — Ambulatory Visit (HOSPITAL_BASED_OUTPATIENT_CLINIC_OR_DEPARTMENT_OTHER)
Admission: RE | Admit: 2024-06-29 | Discharge: 2024-06-29 | Disposition: A | Discharge status: Home / Self Care | Source: Ambulatory Visit | Attending: Oncology | Admitting: Oncology

## 2024-06-29 ENCOUNTER — Other Ambulatory Visit: Payer: Self-pay

## 2024-06-29 ENCOUNTER — Other Ambulatory Visit (HOSPITAL_COMMUNITY): Payer: Self-pay

## 2024-06-29 ENCOUNTER — Telehealth: Payer: Self-pay

## 2024-06-29 DIAGNOSIS — C7800 Secondary malignant neoplasm of unspecified lung: Secondary | ICD-10-CM

## 2024-06-29 DIAGNOSIS — Z5111 Encounter for antineoplastic chemotherapy: Secondary | ICD-10-CM | POA: Diagnosis not present

## 2024-06-29 DIAGNOSIS — C183 Malignant neoplasm of hepatic flexure: Secondary | ICD-10-CM

## 2024-06-29 LAB — CBC WITH DIFFERENTIAL (CANCER CENTER ONLY)
Abs Immature Granulocytes: 0.01 10*3/uL (ref 0.00–0.07)
Basophils Absolute: 0 10*3/uL (ref 0.0–0.1)
Basophils Relative: 0 %
Eosinophils Absolute: 0.1 10*3/uL (ref 0.0–0.5)
Eosinophils Relative: 1 %
HCT: 31.8 % — ABNORMAL LOW (ref 36.0–46.0)
Hemoglobin: 10.3 g/dL — ABNORMAL LOW (ref 12.0–15.0)
Immature Granulocytes: 0 %
Lymphocytes Relative: 14 %
Lymphs Abs: 0.5 10*3/uL — ABNORMAL LOW (ref 0.7–4.0)
MCH: 35.6 pg — ABNORMAL HIGH (ref 26.0–34.0)
MCHC: 32.4 g/dL (ref 30.0–36.0)
MCV: 110 fL — ABNORMAL HIGH (ref 80.0–100.0)
Monocytes Absolute: 0.4 10*3/uL (ref 0.1–1.0)
Monocytes Relative: 11 %
Neutro Abs: 3 10*3/uL (ref 1.7–7.7)
Neutrophils Relative %: 74 %
Platelet Count: 37 10*3/uL — ABNORMAL LOW (ref 150–400)
RBC: 2.89 MIL/uL — ABNORMAL LOW (ref 3.87–5.11)
RDW: 16.8 % — ABNORMAL HIGH (ref 11.5–15.5)
WBC Count: 4 10*3/uL (ref 4.0–10.5)
nRBC: 0 % (ref 0.0–0.2)

## 2024-06-29 LAB — CMP (CANCER CENTER ONLY)
ALT: 29 U/L (ref 0–44)
AST: 46 U/L — ABNORMAL HIGH (ref 15–41)
Albumin: 3 g/dL — ABNORMAL LOW (ref 3.5–5.0)
Alkaline Phosphatase: 572 U/L — ABNORMAL HIGH (ref 38–126)
Anion gap: 8 (ref 5–15)
BUN: 19 mg/dL (ref 6–20)
CO2: 28 mmol/L (ref 22–32)
Calcium: 8.8 mg/dL — ABNORMAL LOW (ref 8.9–10.3)
Chloride: 105 mmol/L (ref 98–111)
Creatinine: 1.24 mg/dL — ABNORMAL HIGH (ref 0.44–1.00)
GFR, Estimated: 51 mL/min — ABNORMAL LOW
Glucose, Bld: 112 mg/dL — ABNORMAL HIGH (ref 70–99)
Potassium: 4.2 mmol/L (ref 3.5–5.1)
Sodium: 141 mmol/L (ref 135–145)
Total Bilirubin: 1 mg/dL (ref 0.0–1.2)
Total Protein: 5.7 g/dL — ABNORMAL LOW (ref 6.5–8.1)

## 2024-06-29 LAB — CEA (ACCESS): CEA (CHCC): 185.5 ng/mL — ABNORMAL HIGH (ref 0.00–5.00)

## 2024-06-29 MED ORDER — HEPARIN SOD (PORK) LOCK FLUSH 100 UNIT/ML IV SOLN
500.0000 [IU] | Freq: Once | INTRAVENOUS | Status: AC
  Administered 2024-06-29: 500 [IU] via INTRAVENOUS

## 2024-06-29 MED ORDER — IOHEXOL 300 MG/ML  SOLN
100.0000 mL | Freq: Once | INTRAMUSCULAR | Status: AC | PRN
  Administered 2024-06-29: 100 mL via INTRAVENOUS

## 2024-06-29 NOTE — Telephone Encounter (Signed)
 Narrative & Impression  EXAM: CT CHEST, ABDOMEN AND PELVIS WITH CONTRAST 06/29/2024 12:56:28 PM   TECHNIQUE: CT of the chest, abdomen and pelvis was performed with the administration of 100 mL of iohexol  (OMNIPAQUE ) 300 MG/ML solution. Multiplanar reformatted images are provided for review. Automated exposure control, iterative reconstruction, and/or weight based adjustment of the mA/kV was utilized to reduce the radiation dose to as low as reasonably achievable.   COMPARISON: Prior study.   CLINICAL HISTORY: Metastatic disease evaluation. Follow-up metastatic colon cancer.   FINDINGS:   CHEST:   MEDIASTINUM AND LYMPH NODES: Mild cardiac enlargement. No pericardial effusion. Aortic atherosclerotic calcification. The central airways are clear. No mediastinal, hilar or axillary lymphadenopathy.   LUNGS AND PLEURA: Bilateral pulmonary metastatic disease. Index lesion within the posterior left upper lobe measures 1.3 cm, image 41/302. Previously 1.1 cm. The index nodule within the superior segment of the left lower lobe measures 1.4 cm, image 152/302. Previously 1.1 cm. Indeterminate nodule within the periphery of the right lower lobe measures 2.3 cm, image 81/302. Unchanged from previous exam. Dominant lesion within the posterior right upper lobe measures 5.4 x 2.8 cm, image 46/302. On the prior exam, this measured 5.6 x 2.6 cm. No pleural effusion. No pneumothorax.   ABDOMEN AND PELVIS:   LIVER: Metastases are again noted. Index lesion within the inferior right lobe of the liver measures 3.9 x 2.8 cm, image 64/301. Previously 2.8 x 3.0 cm. Central liver lesion adjacent to the IVC measures 1.8 x 1.8 cm, image 52/301. Previously 1.7 x 1.8 cm. A lesion in the lateral segment of the left lobe of the liver measures 1.1 cm, image 57/301. Previously 0.6 cm.   GALLBLADDER AND BILE DUCTS: Decompressed gallbladder with mild wall thickening noted and appears unchanged from the  prior study. No biliary ductal dilatation.   SPLEEN: The spleen measures 14.8 cm in length which is unchanged from the prior study. No focal splenic lesion.   PANCREAS: The pancreas is normal in size and contour without focal lesion or ductal dilatation.   ADRENAL GLANDS: Normal adrenal glands.   KIDNEYS, URETERS AND BLADDER: No stones in the kidneys or ureters. No hydronephrosis. No perinephric or periureteral stranding. Urinary bladder is unremarkable.   GI AND BOWEL: Status post right hemicolectomy with proximal transverse enterocolonic anastomosis. Mild distention of the gastric lumen as well as the duodenum. Proximal small bowel loops are dilated measuring up to 5.2 cm in diameter, image 97/301. New from previous exam. Transition point is in the right hemiabdomen beyond this point, contrast-opacified small bowel loops gradually decrease in size with contrast up to the level of the proximal transverse enterocolonic anastomosis. Mild wall thickening involving the transverse and descending colon. No abnormal colonic dilatation.   REPRODUCTIVE ORGANS: No acute abnormality.   PERITONEUM AND RETROPERITONEUM: Abdominopelvic ascites is again noted, moderate to large in volume and unchanged from the previous study. No loculated fluid collections. No free air.   VASCULATURE: Aorta is normal in caliber. Aortic atherosclerotic calcification. Increased upper abdominal varicosities with recanalization of the umbilical veins.   ABDOMINAL AND PELVIS LYMPH NODES: Porta hepatis lymph node measures 1.1 cm and is new from the previous exam. No pelvic or inguinal adenopathy.   BONES AND SOFT TISSUES: No acute or suspicious osseous findings. No focal soft tissue abnormality.   The findings of bilateral pulmonary metastatic disease, liver metastases, and a new porta hepatis lymph node, in the setting of follow-up for metastatic colon cancer, are highly suggestive of progressive  metastatic disease. The dilated proximal small bowel loops with a transition point in the right hemiabdomen, new from the previous exam, raise concern for a partial small bowel obstruction, potentially related to metastatic implants or adhesions from prior surgery. The mild wall thickening of the transverse and descending colon could represent inflammatory changes, ischemic changes, or metastatic involvement. The decompressed gallbladder with mild wall thickening is unchanged and likely chronic. The ascites is unchanged and likely related to underlying malignancy or portal hypertension. Correlation with clinical symptoms, particularly regarding the small bowel obstruction, is recommended. Further evaluation of the small bowel obstruction, such as with an enterography, may be considered. Continued oncologic follow-up and monitoring of metastatic disease progression is advised.   IMPRESSION: 1. Bilateral pulmonary and hepatic metastatic disease with stable to mild interval increase in size of some index lesions, and new porta hepatis lymph node suspicious for nodal metastasis. 2. New partial small bowel obstruction with transition point in the right hemiabdomen. 3. Moderate to large abdominopelvic ascites, unchanged. 4. Mild wall thickening involving the transverse and descending colon without abnormal colonic dilatation, favored colitis (infectious, inflammatory, or ischemic). 5. The urgent finding will be called to the ordering provider by the Professional Radiology Assistants (PRAs) and documented in the [facility] dashboard.

## 2024-06-30 ENCOUNTER — Telehealth: Payer: Self-pay

## 2024-06-30 NOTE — Telephone Encounter (Signed)
 Opened in error

## 2024-07-03 ENCOUNTER — Inpatient Hospital Stay: Admitting: Oncology

## 2024-07-03 ENCOUNTER — Inpatient Hospital Stay

## 2024-07-03 NOTE — Progress Notes (Unsigned)
 "                [facility] at [facility] [address] [place],  [place]  [address] [phone]    Clinic Day:  06/06/2024  Referring physician: [place] Harlene SAILOR, NP  HISTORY OF PRESENT ILLNESS:  The patient is a 59 y.o. female with metastatic colon cancer, which includes spread of disease to her abdominal cavity, lungs, brain, liver, and right adrenal gland.  She comes in today to go over her CT scans to ascertain her new disease baseline after 4 cycles of Lonsurf /Avastin .   Since her last visit, the patient has been doing okay.  She has intermittent diarrhea and nausea, but the symptoms have not been particularly severe.  The patient denies having recurrent ascites over these past few weeks.  Of note, due to insurance issues, there was a brief lapse in her Lonsurf  therapy.  However, the patient was able to get free drug through the pharmaceutical company.  She is scheduled to receive her third cycle of oral Lonsurf  in 2 days.  With respect to her colon cancer history, she is status post a right hemicolectomy in early October 2018, followed by 12 cycles of adjuvant FOLFOX chemotherapy, which were completed in April 2019.  In December 2021, she underwent a left cerebellar metastasectomy, whose pathology was consistent with metastatic colon cancer.  Tumor testing did come back MMR normal. CT scans revealed evidence of her cancer being in multiple locations, for which she took 40 cycles of FOLFIRI/Avastin .  As follow-up scans revealed liver metastasis, she was placed back on FOLFOX/Avastin .  She underwent 30 cycles of this regimen before scans showed disease progression.  This led to her recently being switched to Lonsurf /Avastin .  PHYSICAL EXAM:  Last menstrual period 02/22/2019. There is no height or weight on file to calculate BMI.  Performance status (ECOG): 1 - Symptomatic but completely ambulatory  Physical Exam Vitals and nursing note reviewed.  Constitutional:       General: She is not in acute distress.    Appearance: Normal appearance.     Comments:    HENT:     Head: Normocephalic and atraumatic.     Mouth/Throat:     Mouth: Mucous membranes are moist.     Pharynx: Oropharynx is clear. No oropharyngeal exudate or posterior oropharyngeal erythema.  Eyes:     General: No scleral icterus.    Extraocular Movements: Extraocular movements intact.     Conjunctiva/sclera: Conjunctivae normal.     Pupils: Pupils are equal, round, and reactive to light.  Cardiovascular:     Rate and Rhythm: Normal rate and regular rhythm.     Heart sounds: Normal heart sounds. No murmur heard.    No friction rub. No gallop.  Pulmonary:     Effort: Pulmonary effort is normal.     Breath sounds: Normal breath sounds. No wheezing, rhonchi or rales.  Abdominal:     General: There is no distension.     Palpations: Abdomen is soft. There is no fluid wave, hepatomegaly, splenomegaly or mass.     Tenderness: There is no abdominal tenderness.  Musculoskeletal:        General: Normal range of motion.     Cervical back: Normal range of motion and neck supple. No tenderness.     Right lower leg: No edema.     Left lower leg: No edema.  Lymphadenopathy:     Cervical: No cervical adenopathy.  Upper Body:     Right upper body: No supraclavicular or axillary adenopathy.     Left upper body: No supraclavicular or axillary adenopathy.     Lower Body: No right inguinal adenopathy. No left inguinal adenopathy.  Skin:    General: Skin is warm and dry.     Coloration: Skin is not jaundiced.     Findings: No rash.  Neurological:     Mental Status: She is alert and oriented to person, place, and time.     Cranial Nerves: No cranial nerve deficit.  Psychiatric:        Mood and Affect: Mood normal.        Behavior: Behavior normal.        Thought Content: Thought content normal.   SCANS:  CT scans of her chest/abdomen/pelvis on 06/29/2024 revealed the following: FINDINGS:    CHEST:   MEDIASTINUM AND LYMPH NODES: Mild cardiac enlargement. No pericardial effusion. Aortic atherosclerotic calcification. The central airways are clear. No mediastinal, hilar or axillary lymphadenopathy.   LUNGS AND PLEURA: Bilateral pulmonary metastatic disease. Index lesion within the posterior left upper lobe measures 1.3 cm, image 41/302. Previously 1.1 cm. The index nodule within the superior segment of the left lower lobe measures 1.4 cm, image 152/302. Previously 1.1 cm. Indeterminate nodule within the periphery of the right lower lobe measures 2.3 cm, image 81/302. Unchanged from previous exam. Dominant lesion within the posterior right upper lobe measures 5.4 x 2.8 cm, image 46/302. On the prior exam, this measured 5.6 x 2.6 cm. No pleural effusion. No pneumothorax.   ABDOMEN AND PELVIS:   LIVER: Metastases are again noted. Index lesion within the inferior right lobe of the liver measures 3.9 x 2.8 cm, image 64/301. Previously 2.8 x 3.0 cm. Central liver lesion adjacent to the IVC measures 1.8 x 1.8 cm, image 52/301. Previously 1.7 x 1.8 cm. A lesion in the lateral segment of the left lobe of the liver measures 1.1 cm, image 57/301. Previously 0.6 cm.   GALLBLADDER AND BILE DUCTS: Decompressed gallbladder with mild wall thickening noted and appears unchanged from the prior study. No biliary ductal dilatation.   SPLEEN: The spleen measures 14.8 cm in length which is unchanged from the prior study. No focal splenic lesion.   PANCREAS: The pancreas is normal in size and contour without focal lesion or ductal dilatation.   ADRENAL GLANDS: Normal adrenal glands.   KIDNEYS, URETERS AND BLADDER: No stones in the kidneys or ureters. No hydronephrosis. No perinephric or periureteral stranding. Urinary bladder is unremarkable.   GI AND BOWEL: Status post right hemicolectomy with proximal transverse enterocolonic anastomosis. Mild distention of the gastric lumen as  well as the duodenum. Proximal small bowel loops are dilated measuring up to 5.2 cm in diameter, image 97/301. New from previous exam. Transition point is in the right hemiabdomen beyond this point, contrast-opacified small bowel loops gradually decrease in size with contrast up to the level of the proximal transverse enterocolonic anastomosis. Mild wall thickening involving the transverse and descending colon. No abnormal colonic dilatation.   REPRODUCTIVE ORGANS: No acute abnormality.   PERITONEUM AND RETROPERITONEUM: Abdominopelvic ascites is again noted, moderate to large in volume and unchanged from the previous study. No loculated fluid collections. No free air.   VASCULATURE: Aorta is normal in caliber. Aortic atherosclerotic calcification. Increased upper abdominal varicosities with recanalization of the umbilical veins.   ABDOMINAL AND PELVIS LYMPH NODES: Porta hepatis lymph node measures 1.1 cm and is new from the  previous exam. No pelvic or inguinal adenopathy.   BONES AND SOFT TISSUES: No acute or suspicious osseous findings. No focal soft tissue abnormality.   The findings of bilateral pulmonary metastatic disease, liver metastases, and a new porta hepatis lymph node, in the setting of follow-up for metastatic colon cancer, are highly suggestive of progressive metastatic disease. The dilated proximal small bowel loops with a transition point in the right hemiabdomen, new from the previous exam, raise concern for a partial small bowel obstruction, potentially related to metastatic implants or adhesions from prior surgery. The mild wall thickening of the transverse and descending colon could represent inflammatory changes, ischemic changes, or metastatic involvement. The decompressed gallbladder with mild wall thickening is unchanged and likely chronic. The ascites is unchanged and likely related to underlying malignancy or portal hypertension. Correlation with clinical  symptoms, particularly regarding the small bowel obstruction, is recommended. Further evaluation of the small bowel obstruction, such as with an enterography, may be considered. Continued oncologic follow-up and monitoring of metastatic disease progression is advised.   IMPRESSION: 1. Bilateral pulmonary and hepatic metastatic disease with stable to mild interval increase in size of some index lesions, and new porta hepatis lymph node suspicious for nodal metastasis. 2. New partial small bowel obstruction with transition point in the right hemiabdomen. 3. Moderate to large abdominopelvic ascites, unchanged. 4. Mild wall thickening involving the transverse and descending colon without abnormal colonic dilatation, favored colitis (infectious, inflammatory, or ischemic). 5. The urgent finding will be called to the ordering provider by the Professional Radiology Assistants (PRAs) and documented in the [facility] dashboard.  LABS:      Latest Ref Rng & Units 06/29/2024   11:18 AM 06/19/2024    9:48 AM 06/05/2024    1:10 PM  CBC  WBC 4.0 - 10.5 K/uL 4.0  2.1  2.1   Hemoglobin 12.0 - 15.0 g/dL 89.6  9.1  9.4   Hematocrit 36.0 - 46.0 % 31.8  28.3  29.0   Platelets 150 - 400 K/uL 37  36  51     Latest Reference Range & Units 06/05/24 13:10  Neutrophils % 63  Lymphocytes % 23  Monocytes Relative % 11  Eosinophil % 3  Basophil % 0  Immature Granulocytes % 0  NEUT# 1.7 - 7.7 K/uL 1.3 (L)  (L): Data is abnormally low    Latest Ref Rng & Units 06/29/2024   11:18 AM 06/05/2024    1:20 PM 05/22/2024    1:54 PM  CMP  Glucose 70 - 99 mg/dL 887  866  810   BUN 6 - 20 mg/dL 19  16  9    Creatinine 0.44 - 1.00 mg/dL 8.75  8.90  8.69   Sodium 135 - 145 mmol/L 141  141  142   Potassium 3.5 - 5.1 mmol/L 4.2  4.3  4.2   Chloride 98 - 111 mmol/L 105  106  107   CO2 22 - 32 mmol/L 28  27  26    Calcium  8.9 - 10.3 mg/dL 8.8  8.7  8.5   Total Protein 6.5 - 8.1 g/dL 5.7  5.6  5.7   Total Bilirubin 0.0  - 1.2 mg/dL 1.0  0.7  0.5   Alkaline Phos 38 - 126 U/L 572  645  624   AST 15 - 41 U/L 46  53  52   ALT 0 - 44 U/L 29  38  32     ASSESSMENT & PLAN:  Assessment/Plan:  A 58  y.o. female with metastatic colon cancer.  The patient will proceed with her fourth cycle of Lonsurf /Avastin  this week.  Overall, she appears to be doing okay.  I will give her 1 L of fluids today as she is concerned about mild/moderate dehydration.  As her hemoglobin is below 10, I will arrange for her to receive Retacrit  20,000 units today.  Otherwise, I will see her back in 4 weeks for repeat clinical assessment.  CT scans of her chest/abdomen/pelvis will be done before her next visit to ascertain her new disease baseline after 4 cycles of of Lonsurf /Avastin .  The patient understands all the plans discussed today and is in agreement with them.  Dandrea Widdowson DELENA Kerns, MD        "

## 2024-07-04 ENCOUNTER — Inpatient Hospital Stay

## 2024-07-04 ENCOUNTER — Inpatient Hospital Stay: Admitting: Oncology

## 2024-07-04 ENCOUNTER — Encounter: Payer: Self-pay | Admitting: Oncology

## 2024-07-05 ENCOUNTER — Encounter: Payer: Self-pay | Admitting: Oncology

## 2024-07-06 ENCOUNTER — Inpatient Hospital Stay

## 2024-07-06 ENCOUNTER — Inpatient Hospital Stay: Admitting: Oncology

## 2024-07-06 NOTE — Progress Notes (Unsigned)
 "                [facility] at [facility] [address] [place],  [place]  [address] [phone]    Clinic Day:  06/06/2024  Referring physician: [place] Harlene SAILOR, NP  HISTORY OF PRESENT ILLNESS:  The patient is a 59 y.o. female with metastatic colon cancer, which includes spread of disease to her abdominal cavity, lungs, brain, liver, and right adrenal gland.  She comes in today to go over her CT scans to ascertain her new disease baseline after 4 cycles of Lonsurf /Avastin .   Since her last visit, the patient has been doing okay.  She has intermittent diarrhea and nausea, but the symptoms have not been particularly severe.  The patient denies having recurrent ascites over these past few weeks.  Of note, due to insurance issues, there was a brief lapse in her Lonsurf  therapy.  However, the patient was able to get free drug through the pharmaceutical company.  She is scheduled to receive her third cycle of oral Lonsurf  in 2 days.  With respect to her colon cancer history, she is status post a right hemicolectomy in early October 2018, followed by 12 cycles of adjuvant FOLFOX chemotherapy, which were completed in April 2019.  In December 2021, she underwent a left cerebellar metastasectomy, whose pathology was consistent with metastatic colon cancer.  Tumor testing did come back MMR normal. CT scans revealed evidence of her cancer being in multiple locations, for which she took 40 cycles of FOLFIRI/Avastin .  As follow-up scans revealed liver metastasis, she was placed back on FOLFOX/Avastin .  She underwent 30 cycles of this regimen before scans showed disease progression.  This led to her recently being switched to Lonsurf /Avastin .  PHYSICAL EXAM:  Last menstrual period 02/22/2019. There is no height or weight on file to calculate BMI.  Performance status (ECOG): 1 - Symptomatic but completely ambulatory  Physical Exam Vitals and nursing note reviewed.  Constitutional:       General: She is not in acute distress.    Appearance: Normal appearance.     Comments:    HENT:     Head: Normocephalic and atraumatic.     Mouth/Throat:     Mouth: Mucous membranes are moist.     Pharynx: Oropharynx is clear. No oropharyngeal exudate or posterior oropharyngeal erythema.  Eyes:     General: No scleral icterus.    Extraocular Movements: Extraocular movements intact.     Conjunctiva/sclera: Conjunctivae normal.     Pupils: Pupils are equal, round, and reactive to light.  Cardiovascular:     Rate and Rhythm: Normal rate and regular rhythm.     Heart sounds: Normal heart sounds. No murmur heard.    No friction rub. No gallop.  Pulmonary:     Effort: Pulmonary effort is normal.     Breath sounds: Normal breath sounds. No wheezing, rhonchi or rales.  Abdominal:     General: There is no distension.     Palpations: Abdomen is soft. There is no fluid wave, hepatomegaly, splenomegaly or mass.     Tenderness: There is no abdominal tenderness.  Musculoskeletal:        General: Normal range of motion.     Cervical back: Normal range of motion and neck supple. No tenderness.     Right lower leg: No edema.     Left lower leg: No edema.  Lymphadenopathy:     Cervical: No cervical adenopathy.  Upper Body:     Right upper body: No supraclavicular or axillary adenopathy.     Left upper body: No supraclavicular or axillary adenopathy.     Lower Body: No right inguinal adenopathy. No left inguinal adenopathy.  Skin:    General: Skin is warm and dry.     Coloration: Skin is not jaundiced.     Findings: No rash.  Neurological:     Mental Status: She is alert and oriented to person, place, and time.     Cranial Nerves: No cranial nerve deficit.  Psychiatric:        Mood and Affect: Mood normal.        Behavior: Behavior normal.        Thought Content: Thought content normal.   SCANS:  CT scans of her chest/abdomen/pelvis on 06/29/2024 revealed the following: FINDINGS:    CHEST:   MEDIASTINUM AND LYMPH NODES: Mild cardiac enlargement. No pericardial effusion. Aortic atherosclerotic calcification. The central airways are clear. No mediastinal, hilar or axillary lymphadenopathy.   LUNGS AND PLEURA: Bilateral pulmonary metastatic disease. Index lesion within the posterior left upper lobe measures 1.3 cm, image 41/302. Previously 1.1 cm. The index nodule within the superior segment of the left lower lobe measures 1.4 cm, image 152/302. Previously 1.1 cm. Indeterminate nodule within the periphery of the right lower lobe measures 2.3 cm, image 81/302. Unchanged from previous exam. Dominant lesion within the posterior right upper lobe measures 5.4 x 2.8 cm, image 46/302. On the prior exam, this measured 5.6 x 2.6 cm. No pleural effusion. No pneumothorax.   ABDOMEN AND PELVIS:   LIVER: Metastases are again noted. Index lesion within the inferior right lobe of the liver measures 3.9 x 2.8 cm, image 64/301. Previously 2.8 x 3.0 cm. Central liver lesion adjacent to the IVC measures 1.8 x 1.8 cm, image 52/301. Previously 1.7 x 1.8 cm. A lesion in the lateral segment of the left lobe of the liver measures 1.1 cm, image 57/301. Previously 0.6 cm.   GALLBLADDER AND BILE DUCTS: Decompressed gallbladder with mild wall thickening noted and appears unchanged from the prior study. No biliary ductal dilatation.   SPLEEN: The spleen measures 14.8 cm in length which is unchanged from the prior study. No focal splenic lesion.   PANCREAS: The pancreas is normal in size and contour without focal lesion or ductal dilatation.   ADRENAL GLANDS: Normal adrenal glands.   KIDNEYS, URETERS AND BLADDER: No stones in the kidneys or ureters. No hydronephrosis. No perinephric or periureteral stranding. Urinary bladder is unremarkable.   GI AND BOWEL: Status post right hemicolectomy with proximal transverse enterocolonic anastomosis. Mild distention of the gastric lumen as  well as the duodenum. Proximal small bowel loops are dilated measuring up to 5.2 cm in diameter, image 97/301. New from previous exam. Transition point is in the right hemiabdomen beyond this point, contrast-opacified small bowel loops gradually decrease in size with contrast up to the level of the proximal transverse enterocolonic anastomosis. Mild wall thickening involving the transverse and descending colon. No abnormal colonic dilatation.   REPRODUCTIVE ORGANS: No acute abnormality.   PERITONEUM AND RETROPERITONEUM: Abdominopelvic ascites is again noted, moderate to large in volume and unchanged from the previous study. No loculated fluid collections. No free air.   VASCULATURE: Aorta is normal in caliber. Aortic atherosclerotic calcification. Increased upper abdominal varicosities with recanalization of the umbilical veins.   ABDOMINAL AND PELVIS LYMPH NODES: Porta hepatis lymph node measures 1.1 cm and is new from the  previous exam. No pelvic or inguinal adenopathy.   BONES AND SOFT TISSUES: No acute or suspicious osseous findings. No focal soft tissue abnormality.   The findings of bilateral pulmonary metastatic disease, liver metastases, and a new porta hepatis lymph node, in the setting of follow-up for metastatic colon cancer, are highly suggestive of progressive metastatic disease. The dilated proximal small bowel loops with a transition point in the right hemiabdomen, new from the previous exam, raise concern for a partial small bowel obstruction, potentially related to metastatic implants or adhesions from prior surgery. The mild wall thickening of the transverse and descending colon could represent inflammatory changes, ischemic changes, or metastatic involvement. The decompressed gallbladder with mild wall thickening is unchanged and likely chronic. The ascites is unchanged and likely related to underlying malignancy or portal hypertension. Correlation with clinical  symptoms, particularly regarding the small bowel obstruction, is recommended. Further evaluation of the small bowel obstruction, such as with an enterography, may be considered. Continued oncologic follow-up and monitoring of metastatic disease progression is advised.   IMPRESSION: 1. Bilateral pulmonary and hepatic metastatic disease with stable to mild interval increase in size of some index lesions, and new porta hepatis lymph node suspicious for nodal metastasis. 2. New partial small bowel obstruction with transition point in the right hemiabdomen. 3. Moderate to large abdominopelvic ascites, unchanged. 4. Mild wall thickening involving the transverse and descending colon without abnormal colonic dilatation, favored colitis (infectious, inflammatory, or ischemic). 5. The urgent finding will be called to the ordering provider by the Professional Radiology Assistants (PRAs) and documented in the [facility] dashboard.  LABS:      Latest Ref Rng & Units 06/29/2024   11:18 AM 06/19/2024    9:48 AM 06/05/2024    1:10 PM  CBC  WBC 4.0 - 10.5 K/uL 4.0  2.1  2.1   Hemoglobin 12.0 - 15.0 g/dL 89.6  9.1  9.4   Hematocrit 36.0 - 46.0 % 31.8  28.3  29.0   Platelets 150 - 400 K/uL 37  36  51     Latest Reference Range & Units 06/05/24 13:10  Neutrophils % 63  Lymphocytes % 23  Monocytes Relative % 11  Eosinophil % 3  Basophil % 0  Immature Granulocytes % 0  NEUT# 1.7 - 7.7 K/uL 1.3 (L)  (L): Data is abnormally low    Latest Ref Rng & Units 06/29/2024   11:18 AM 06/05/2024    1:20 PM 05/22/2024    1:54 PM  CMP  Glucose 70 - 99 mg/dL 887  866  810   BUN 6 - 20 mg/dL 19  16  9    Creatinine 0.44 - 1.00 mg/dL 8.75  8.90  8.69   Sodium 135 - 145 mmol/L 141  141  142   Potassium 3.5 - 5.1 mmol/L 4.2  4.3  4.2   Chloride 98 - 111 mmol/L 105  106  107   CO2 22 - 32 mmol/L 28  27  26    Calcium  8.9 - 10.3 mg/dL 8.8  8.7  8.5   Total Protein 6.5 - 8.1 g/dL 5.7  5.6  5.7   Total Bilirubin 0.0  - 1.2 mg/dL 1.0  0.7  0.5   Alkaline Phos 38 - 126 U/L 572  645  624   AST 15 - 41 U/L 46  53  52   ALT 0 - 44 U/L 29  38  32     ASSESSMENT & PLAN:  Assessment/Plan:  A 58  y.o. female with metastatic colon cancer.  The patient will proceed with her fourth cycle of Lonsurf /Avastin  this week.  Overall, she appears to be doing okay.  I will give her 1 L of fluids today as she is concerned about mild/moderate dehydration.  As her hemoglobin is below 10, I will arrange for her to receive Retacrit  20,000 units today.  Otherwise, I will see her back in 4 weeks for repeat clinical assessment.  CT scans of her chest/abdomen/pelvis will be done before her next visit to ascertain her new disease baseline after 4 cycles of of Lonsurf /Avastin .  The patient understands all the plans discussed today and is in agreement with them.  Dandrea Widdowson DELENA Kerns, MD        "

## 2024-07-07 ENCOUNTER — Other Ambulatory Visit: Payer: Self-pay | Admitting: Oncology

## 2024-07-07 ENCOUNTER — Inpatient Hospital Stay: Admitting: Oncology

## 2024-07-07 ENCOUNTER — Inpatient Hospital Stay

## 2024-07-07 ENCOUNTER — Inpatient Hospital Stay: Attending: Oncology

## 2024-07-07 DIAGNOSIS — C182 Malignant neoplasm of ascending colon: Secondary | ICD-10-CM

## 2024-07-07 DIAGNOSIS — C787 Secondary malignant neoplasm of liver and intrahepatic bile duct: Secondary | ICD-10-CM

## 2024-07-07 DIAGNOSIS — C189 Malignant neoplasm of colon, unspecified: Secondary | ICD-10-CM

## 2024-07-07 LAB — CBC WITH DIFFERENTIAL (CANCER CENTER ONLY)
Abs Immature Granulocytes: 0 10*3/uL (ref 0.00–0.07)
Basophils Absolute: 0 10*3/uL (ref 0.0–0.1)
Basophils Relative: 0 %
Eosinophils Absolute: 0.1 10*3/uL (ref 0.0–0.5)
Eosinophils Relative: 2 %
HCT: 27.9 % — ABNORMAL LOW (ref 36.0–46.0)
Hemoglobin: 9.1 g/dL — ABNORMAL LOW (ref 12.0–15.0)
Immature Granulocytes: 0 %
Lymphocytes Relative: 21 %
Lymphs Abs: 0.6 10*3/uL — ABNORMAL LOW (ref 0.7–4.0)
MCH: 35.3 pg — ABNORMAL HIGH (ref 26.0–34.0)
MCHC: 32.6 g/dL (ref 30.0–36.0)
MCV: 108.1 fL — ABNORMAL HIGH (ref 80.0–100.0)
Monocytes Absolute: 0.3 10*3/uL (ref 0.1–1.0)
Monocytes Relative: 10 %
Neutro Abs: 1.9 10*3/uL (ref 1.7–7.7)
Neutrophils Relative %: 67 %
Platelet Count: 41 10*3/uL — ABNORMAL LOW (ref 150–400)
RBC: 2.58 MIL/uL — ABNORMAL LOW (ref 3.87–5.11)
RDW: 15.1 % (ref 11.5–15.5)
WBC Count: 2.8 10*3/uL — ABNORMAL LOW (ref 4.0–10.5)
nRBC: 0 % (ref 0.0–0.2)

## 2024-07-07 LAB — CMP (CANCER CENTER ONLY)
ALT: 24 U/L (ref 0–44)
AST: 36 U/L (ref 15–41)
Albumin: 2.9 g/dL — ABNORMAL LOW (ref 3.5–5.0)
Alkaline Phosphatase: 527 U/L — ABNORMAL HIGH (ref 38–126)
Anion gap: 8 (ref 5–15)
BUN: 16 mg/dL (ref 6–20)
CO2: 24 mmol/L (ref 22–32)
Calcium: 8.4 mg/dL — ABNORMAL LOW (ref 8.9–10.3)
Chloride: 109 mmol/L (ref 98–111)
Creatinine: 1.24 mg/dL — ABNORMAL HIGH (ref 0.44–1.00)
GFR, Estimated: 51 mL/min — ABNORMAL LOW
Glucose, Bld: 140 mg/dL — ABNORMAL HIGH (ref 70–99)
Potassium: 4 mmol/L (ref 3.5–5.1)
Sodium: 141 mmol/L (ref 135–145)
Total Bilirubin: 0.5 mg/dL (ref 0.0–1.2)
Total Protein: 5.5 g/dL — ABNORMAL LOW (ref 6.5–8.1)

## 2024-07-07 LAB — CEA (ACCESS): CEA (CHCC): 196.97 ng/mL — ABNORMAL HIGH (ref 0.00–5.00)

## 2024-07-12 ENCOUNTER — Ambulatory Visit: Admitting: Psychiatry

## 2024-07-20 ENCOUNTER — Other Ambulatory Visit

## 2024-07-24 ENCOUNTER — Encounter

## 2024-07-25 ENCOUNTER — Ambulatory Visit: Admitting: Internal Medicine
